# Patient Record
Sex: Female | Born: 1955 | Race: White | Hispanic: No | Marital: Married | Smoking: Former smoker
Health system: Southern US, Community
[De-identification: ages and names within clinical notes are randomized; demographics above are authoritative.]

## PROBLEM LIST (undated history)

## (undated) DIAGNOSIS — R112 Nausea with vomiting, unspecified: Secondary | ICD-10-CM

## (undated) DIAGNOSIS — M810 Age-related osteoporosis without current pathological fracture: Secondary | ICD-10-CM

## (undated) DIAGNOSIS — Z9221 Personal history of antineoplastic chemotherapy: Secondary | ICD-10-CM

## (undated) DIAGNOSIS — E7800 Pure hypercholesterolemia, unspecified: Secondary | ICD-10-CM

## (undated) DIAGNOSIS — Z9889 Other specified postprocedural states: Secondary | ICD-10-CM

## (undated) DIAGNOSIS — C801 Malignant (primary) neoplasm, unspecified: Secondary | ICD-10-CM

## (undated) DIAGNOSIS — K635 Polyp of colon: Secondary | ICD-10-CM

## (undated) DIAGNOSIS — D649 Anemia, unspecified: Secondary | ICD-10-CM

## (undated) DIAGNOSIS — T7840XA Allergy, unspecified, initial encounter: Secondary | ICD-10-CM

## (undated) DIAGNOSIS — Z803 Family history of malignant neoplasm of breast: Secondary | ICD-10-CM

## (undated) DIAGNOSIS — G620 Drug-induced polyneuropathy: Secondary | ICD-10-CM

## (undated) HISTORY — DX: Family history of malignant neoplasm of breast: Z80.3

## (undated) HISTORY — PX: COLONOSCOPY: SHX174

## (undated) HISTORY — DX: Polyp of colon: K63.5

## (undated) HISTORY — DX: Age-related osteoporosis without current pathological fracture: M81.0

## (undated) HISTORY — DX: Allergy, unspecified, initial encounter: T78.40XA

---

## 1990-06-28 HISTORY — PX: OVARIAN CYST REMOVAL: SHX89

## 1991-06-29 HISTORY — PX: TUBAL LIGATION: SHX77

## 2007-05-30 ENCOUNTER — Ambulatory Visit: Payer: Self-pay | Admitting: Gastroenterology

## 2015-07-30 HISTORY — PX: COLONOSCOPY: SHX174

## 2016-08-16 ENCOUNTER — Other Ambulatory Visit: Payer: Self-pay | Admitting: Certified Nurse Midwife

## 2016-08-16 DIAGNOSIS — Z1231 Encounter for screening mammogram for malignant neoplasm of breast: Secondary | ICD-10-CM

## 2016-09-14 ENCOUNTER — Ambulatory Visit
Admission: RE | Admit: 2016-09-14 | Discharge: 2016-09-14 | Disposition: A | Discharge status: Home / Self Care | Payer: BC Managed Care – PPO | Source: Ambulatory Visit | Attending: Certified Nurse Midwife | Admitting: Certified Nurse Midwife

## 2016-09-14 DIAGNOSIS — Z1231 Encounter for screening mammogram for malignant neoplasm of breast: Secondary | ICD-10-CM | POA: Diagnosis not present

## 2016-09-14 IMAGING — MG MM DIGITAL SCREENING BILAT W/ TOMO W/ CAD
9 of 13 series · 9 of 29 positions shown · non-contrast
Comparison: Previous exam(s).

CLINICAL DATA: Screening.

EXAM:
2D DIGITAL SCREENING BILATERAL MAMMOGRAM WITH CAD AND ADJUNCT TOMO

[L MLO (1 of 2)]
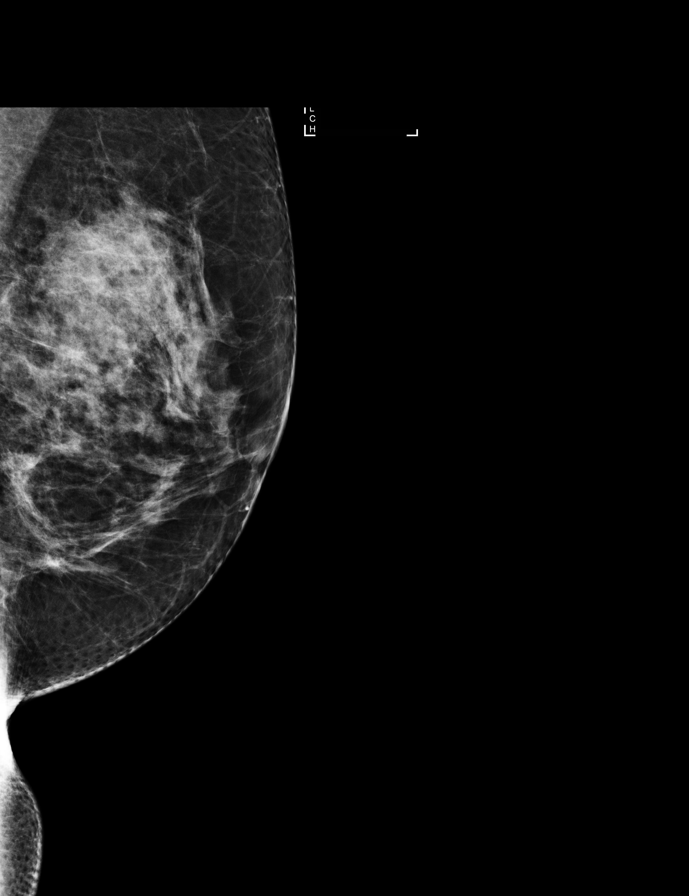

[R MLO]
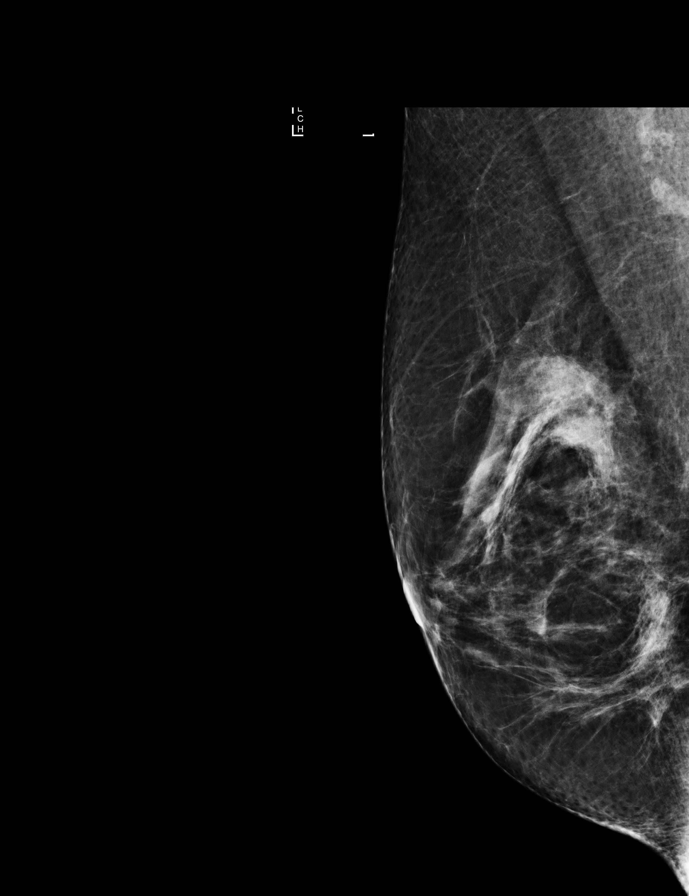

[R CC synth-2D]
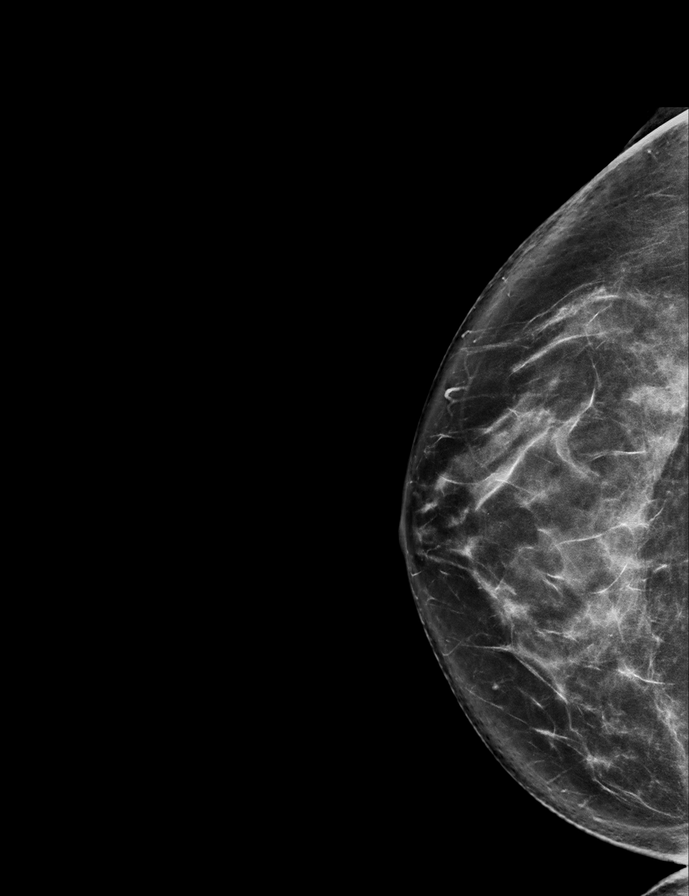

[L CC synth-2D]
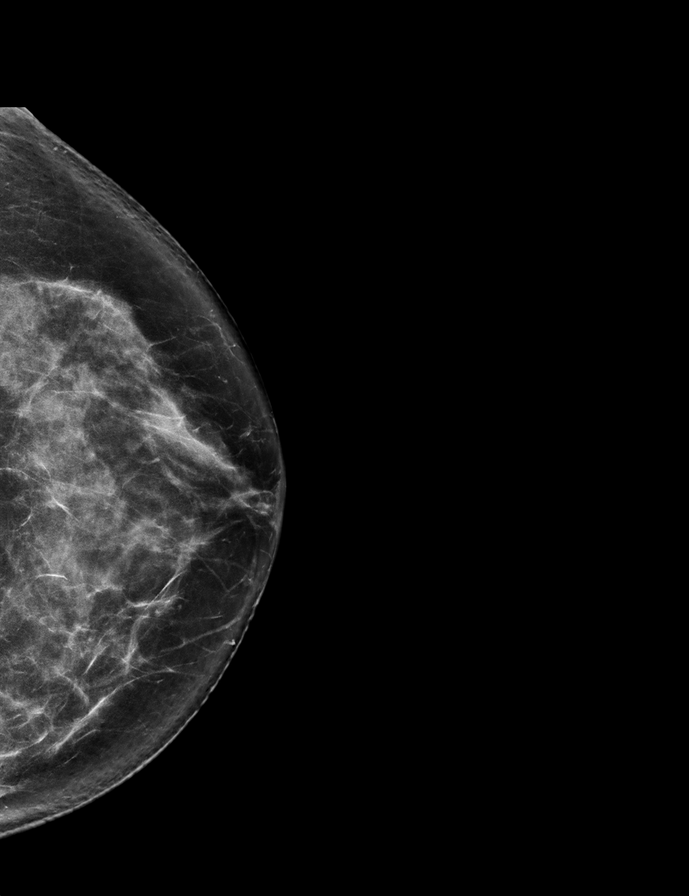

[R CC]
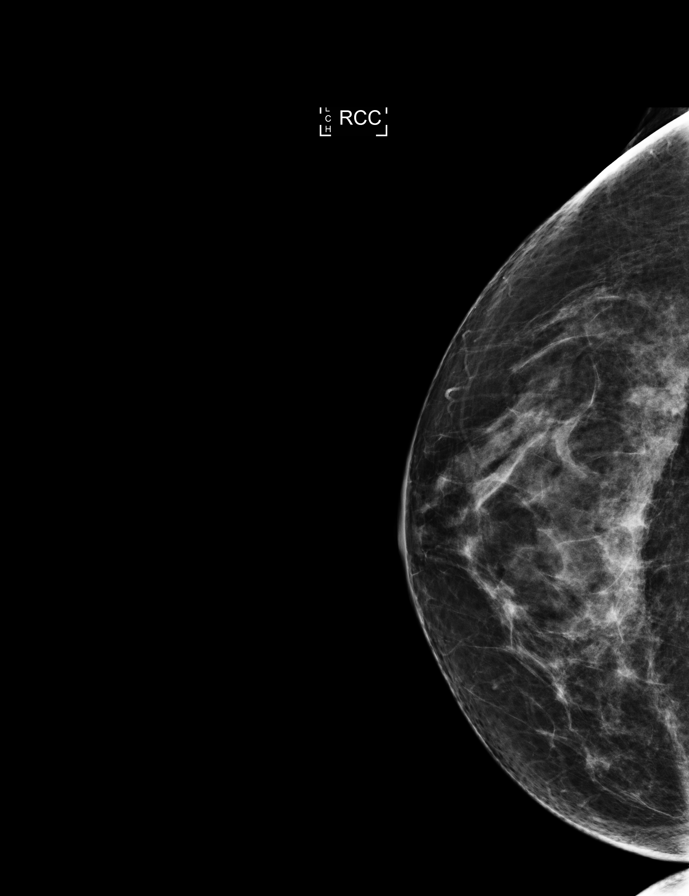

[L MLO (2 of 2)]
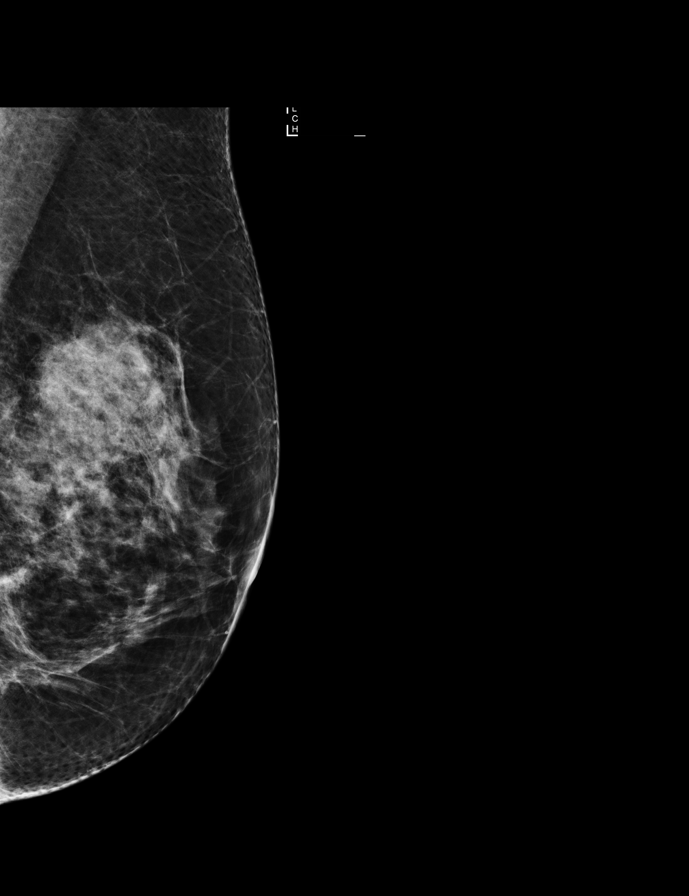

[L CC]
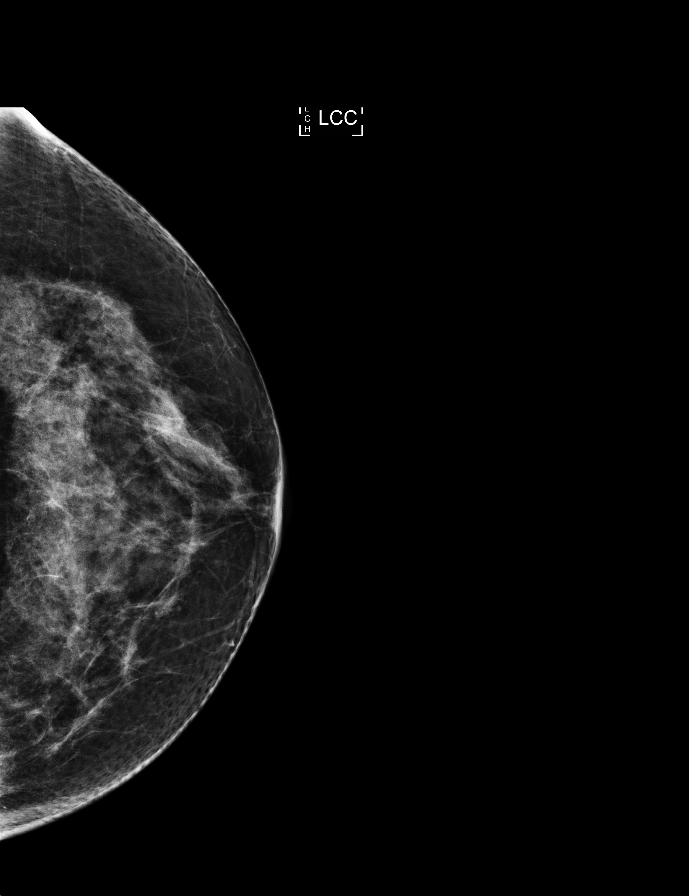

[R MLO synth-2D]
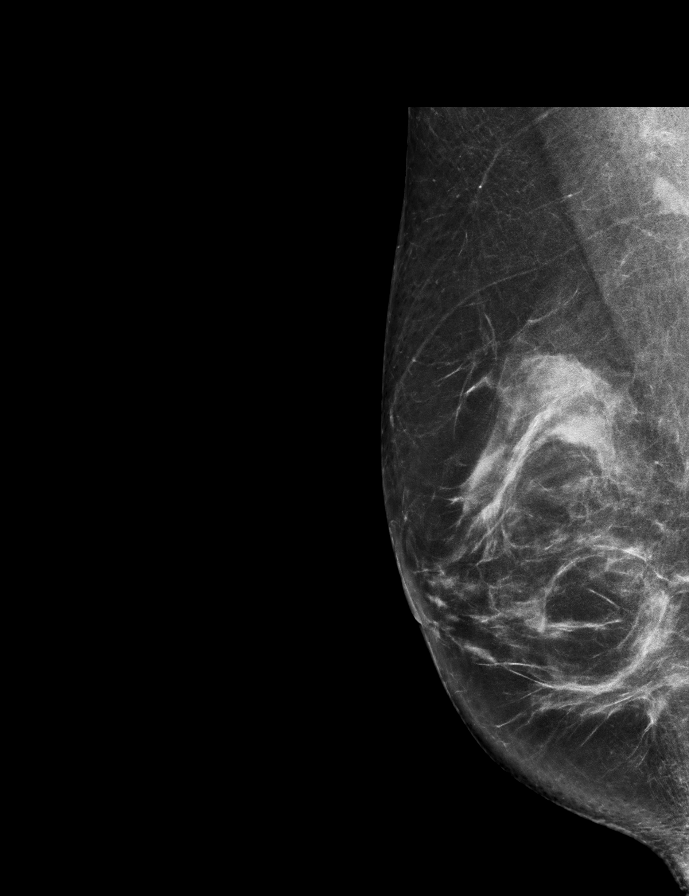

[L MLO synth-2D]
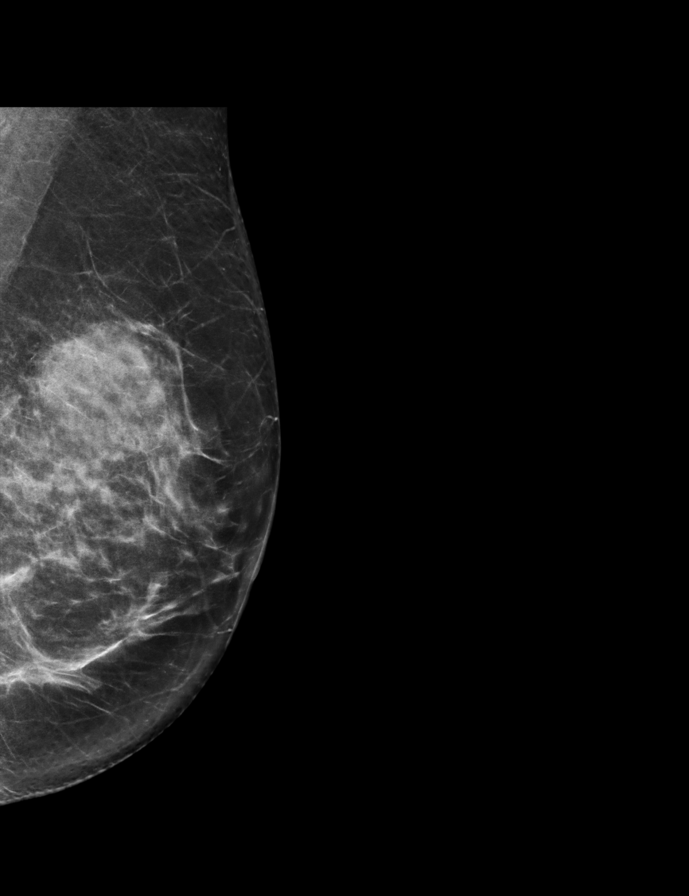

[9 of 29 positions shown; findings below may reference images not displayed]

ACR Breast Density Category c: The breast tissue is heterogeneously
dense, which may obscure small masses.
FINDINGS: There are no findings suspicious for malignancy. Images were
processed with CAD.
IMPRESSION: No mammographic evidence of malignancy. A result letter of this
screening mammogram will be mailed directly to the patient.

RECOMMENDATION:
Screening mammogram in one year. (Code:[TA])

BI-RADS CATEGORY  1: Negative.

## 2016-09-22 ENCOUNTER — Inpatient Hospital Stay
Admission: RE | Admit: 2016-09-22 | Discharge: 2016-09-22 | Disposition: A | Discharge status: Home / Self Care | Payer: Self-pay | Source: Ambulatory Visit | Attending: *Deleted | Admitting: *Deleted

## 2016-09-22 ENCOUNTER — Other Ambulatory Visit: Payer: Self-pay | Admitting: *Deleted

## 2016-09-22 DIAGNOSIS — Z9289 Personal history of other medical treatment: Secondary | ICD-10-CM

## 2017-05-12 DIAGNOSIS — Z780 Asymptomatic menopausal state: Secondary | ICD-10-CM | POA: Insufficient documentation

## 2017-05-28 DIAGNOSIS — Z780 Asymptomatic menopausal state: Secondary | ICD-10-CM

## 2017-05-28 DIAGNOSIS — M8580 Other specified disorders of bone density and structure, unspecified site: Secondary | ICD-10-CM

## 2017-05-28 HISTORY — DX: Other specified disorders of bone density and structure, unspecified site: M85.80

## 2017-05-28 HISTORY — DX: Asymptomatic menopausal state: Z78.0

## 2017-09-09 ENCOUNTER — Encounter: Payer: Self-pay | Admitting: Certified Nurse Midwife

## 2017-09-09 ENCOUNTER — Ambulatory Visit (INDEPENDENT_AMBULATORY_CARE_PROVIDER_SITE_OTHER): Payer: BC Managed Care – PPO | Admitting: Certified Nurse Midwife

## 2017-09-09 VITALS — BP 128/78 | HR 66 | Ht 67.5 in | Wt 162.0 lb

## 2017-09-09 DIAGNOSIS — Z124 Encounter for screening for malignant neoplasm of cervix: Secondary | ICD-10-CM | POA: Diagnosis not present

## 2017-09-09 DIAGNOSIS — Z1239 Encounter for other screening for malignant neoplasm of breast: Secondary | ICD-10-CM

## 2017-09-09 DIAGNOSIS — M810 Age-related osteoporosis without current pathological fracture: Secondary | ICD-10-CM

## 2017-09-09 DIAGNOSIS — Z780 Asymptomatic menopausal state: Secondary | ICD-10-CM

## 2017-09-09 DIAGNOSIS — Z1231 Encounter for screening mammogram for malignant neoplasm of breast: Secondary | ICD-10-CM

## 2017-09-09 DIAGNOSIS — M8580 Other specified disorders of bone density and structure, unspecified site: Secondary | ICD-10-CM

## 2017-09-09 DIAGNOSIS — Z01419 Encounter for gynecological examination (general) (routine) without abnormal findings: Secondary | ICD-10-CM | POA: Diagnosis not present

## 2017-09-09 NOTE — Progress Notes (Signed)
Gynecology Annual Exam  PCP: Glendon Axe, MD  Chief Complaint:  Chief Complaint  Patient presents with  . Gynecologic Exam    History of Present Illness:Cassandra [name] presents today for her annual exam. She is a 62 year old Caucasian/White female, G 3 P 2 0 1 2, whose LMP was in the distant past. Her menses are absent and she is postmenopausal. She has had no spotting.  She does not have central weight gain, difficulty sleeping, hot flashes, joint pain, mood changes, and night sweats.   The patient's past medical history is remarkable for osteopenia.  Since her last annual GYN exam dated 07/05/2016, she has had no significant changes in her health history.  She is sexually active. She does have vaginal dryness. She is using lubricants with modest symptomatic relief.  Her most recent pap smear was obtained 07/05/2016 and was NIL. Her most recent mammogram obtained on 09/14/2016 was normal. There is a positive history of breast cancer in her paternal aunt. Genetic testing was not done. There is no family history of ovarian cancer. The patient does do monthly self breast exams.  She had a colonoscopy in 07/2015 that was normal. Her next colonoscopy is due in 5 years.  She had a recent DEXA scan obtained 05/30/2017  that showed osteopenia in the femoral neck (T score -2.0). She is taking calcium and vitamin D3 supplements The patient does not smoke.  The patient does drink 7-10 cans of beer/week. The patient does not use illegal drugs.  The patient exercises by walking her dogs 30 min on most days. 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 Her BMI is 25 kg/m2 The patient does get adequate calcium in her diet and with her supplement She has had a recent cholesterol screen in 2018 that was borderline.    The patient denies current symptoms of depression.    Review of Systems: Review of Systems  Constitutional: Negative for chills, fever and weight loss.  HENT: Positive for congestion.  Negative for sinus pain and sore throat.   Eyes: Negative for blurred vision and pain.  Respiratory: Positive for cough. Negative for hemoptysis, shortness of breath and wheezing.   Cardiovascular: Negative for chest pain, palpitations and leg swelling.  Gastrointestinal: Negative for abdominal pain, blood in stool, diarrhea, heartburn, nausea and vomiting.  Genitourinary: Negative for dysuria, frequency, hematuria and urgency.       Positive for vaginal dryness  Musculoskeletal: Negative for back pain, joint pain and myalgias.  Skin: Negative for itching and rash.  Neurological: Negative for dizziness, tingling and headaches.  Endo/Heme/Allergies: Negative for environmental allergies and polydipsia. Does not bruise/bleed easily.       Negative for hirsutism   Psychiatric/Behavioral: Negative for depression. The patient is not nervous/anxious and does not have insomnia.     Past Medical History:  Past Medical History:  Diagnosis Date  . Colon polyps   . Osteopenia after menopause 05/2017   femoral neck T score -2.0    Past Surgical History:  Past Surgical History:  Procedure Laterality Date  . COLONOSCOPY  07/2015   WNL  . COLONOSCOPY  2008/2011  . OVARIAN CYST REMOVAL  1992   dermoid-Dr [place]  . TUBAL LIGATION  1993    Family History:  Family History  Problem Relation Age of Onset  . Breast cancer Paternal Aunt 16  . Diabetes Mother   . Osteoporosis Mother   . Hyperlipidemia Father   . Rheumatic  fever Father   . Valvular heart disease Father   . Colon cancer Paternal Grandmother     Social History:  Social History   Socioeconomic History  . Marital status: Married    Spouse name: Not on file  . Number of children: 2  . Years of education: Not on file  . Highest education level: Not on file  Social Needs  . Financial resource strain: Not on file  . Food insecurity - worry: Not on file  . Food insecurity - inability: Not on file  . Transportation needs -  medical: Not on file  . Transportation needs - non-medical: Not on file  Occupational History  . Occupation: Pharmacist, hospital  . Occupation: Farmer  Tobacco Use  . Smoking status: Former Research scientist (life sciences)  . Smokeless tobacco: Never Used  . Tobacco comment: Quit smoking 1987  Substance and Sexual Activity  . Alcohol use: Yes    Alcohol/week: 4.2 - 6.0 oz    Types: 7 - 10 Cans of beer per week  . Drug use: No  . Sexual activity: Yes    Birth control/protection: Post-menopausal  Other Topics Concern  . Not on file  Social History Narrative  . Not on file    Allergies:  Allergies  Allergen Reactions  . Penicillin G Hives and Swelling    Rxn as a child    Medications:  Current Outpatient Medications on File Prior to Visit  Medication Sig Dispense Refill  . calcium-vitamin D (OSCAL WITH D) 500-200 MG-UNIT TABS tablet Take by mouth.    . Cholecalciferol (VITAMIN D-1000 MAX ST) 1000 units tablet Take by mouth.    . Melatonin 3 MG TABS Take by mouth at bedtime as needed.    . Multiple Vitamins-Minerals (MULTIVITAMIN ADULT PO) Take by mouth.     No current facility-administered medications on file prior to visit.     Also Flonase prn Physical Exam Vitals: BP 128/78   Pulse 66   Ht 5' 7.5" (1.715 m)   Wt 162 lb (73.5 kg)   BMI 25.00 kg/m  General: WF in NAD HEENT: normocephalic, anicteric Neck: no thyroid enlargement, no palpable nodules, no cervical lymphadenopathy  Pulmonary: No increased work of breathing, CTAB Cardiovascular: RRR, without murmur  Breast: Breast symmetrical, no tenderness, no palpable nodules or masses, no skin or nipple retraction present, no nipple discharge.  No axillary, infraclavicular or supraclavicular lymphadenopathy. Abdomen: Soft, non-tender, non-distended.  Umbilicus without lesions.  No hepatomegaly or masses palpable. No evidence of hernia. Genitourinary:  External: Atrophic changes  Normal urethral meatus, normal Bartholin's and Skene's glands.    Vagina:  flattened rugae, no evidence of prolapse.    Cervix: Grossly normal in appearance, no bleeding, non-tender  Uterus: Anteverted to midplane, normal size, shape, and consistency, mobile, and non-tender  Adnexa: No adnexal masses, non-tender  Rectal: deferred  Lymphatic: no evidence of inguinal lymphadenopathy Extremities: no edema, erythema, or tenderness Neurologic: Grossly intact Psychiatric: mood appropriate, affect full     Assessment: 62 y.o. annual gyn exam  Plan:  1) Breast cancer screening - recommend monthly self breast exam and annual mamogram. Mammogram was ordered today.  Colon cancer screening-colonoscopy UTD.  3) Cervical cancer screening - Pap was done. ASCCP guidelines and rational discussed.  Patient opts for yearly screening interval  4) Osteoporosis prevention-discussed calcium and vitamin D3 requirements and the role of exercise in preventing osteoporosis  5) Routine healthcare maintenance including cholesterol and diabetes screening managed by PCP   6) RTO 1 year  and prn   Dalia Heading, CNM

## 2017-09-11 ENCOUNTER — Encounter: Payer: Self-pay | Admitting: Certified Nurse Midwife

## 2017-09-11 DIAGNOSIS — M810 Age-related osteoporosis without current pathological fracture: Secondary | ICD-10-CM

## 2017-09-11 DIAGNOSIS — M8580 Other specified disorders of bone density and structure, unspecified site: Secondary | ICD-10-CM | POA: Insufficient documentation

## 2017-09-11 DIAGNOSIS — Z780 Asymptomatic menopausal state: Secondary | ICD-10-CM | POA: Insufficient documentation

## 2017-09-16 LAB — IGP, APTIMA HPV
HPV Aptima: NEGATIVE
PAP Smear Comment: 0

## 2017-09-30 ENCOUNTER — Ambulatory Visit
Admission: RE | Admit: 2017-09-30 | Discharge: 2017-09-30 | Disposition: A | Discharge status: Home / Self Care | Payer: BC Managed Care – PPO | Source: Ambulatory Visit | Attending: Certified Nurse Midwife | Admitting: Certified Nurse Midwife

## 2017-09-30 DIAGNOSIS — Z1231 Encounter for screening mammogram for malignant neoplasm of breast: Secondary | ICD-10-CM | POA: Diagnosis not present

## 2017-09-30 DIAGNOSIS — Z1239 Encounter for other screening for malignant neoplasm of breast: Secondary | ICD-10-CM

## 2017-09-30 IMAGING — MG MM DIGITAL SCREENING BILAT W/ TOMO W/ CAD
9 of 13 series · 9 of 29 positions shown · non-contrast
Comparison: Previous exam(s).

CLINICAL DATA: Screening.

EXAM:
DIGITAL SCREENING BILATERAL MAMMOGRAM WITH TOMO AND CAD

[R MLO (1 of 2)]
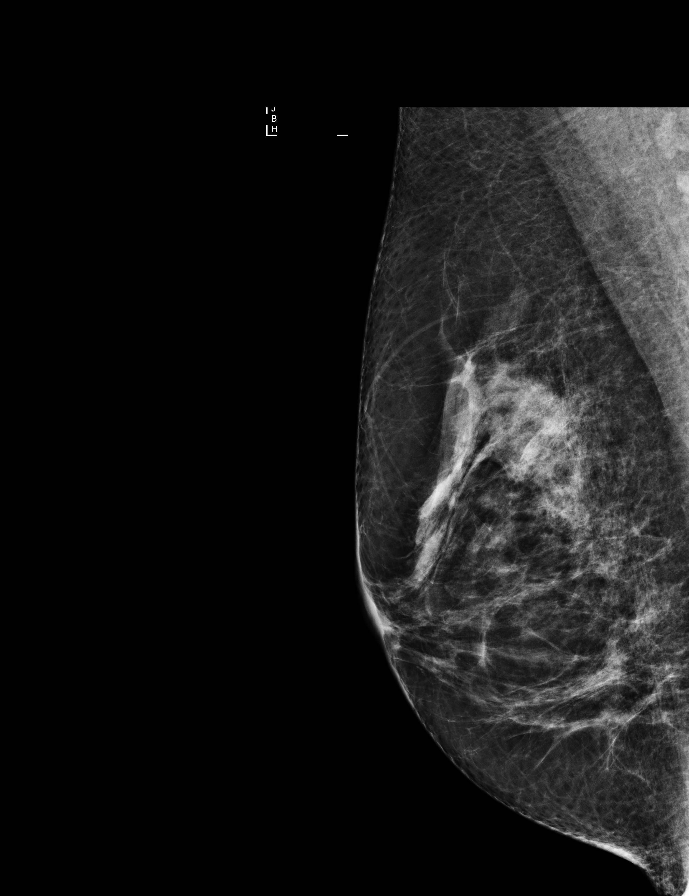

[L MLO synth-2D]
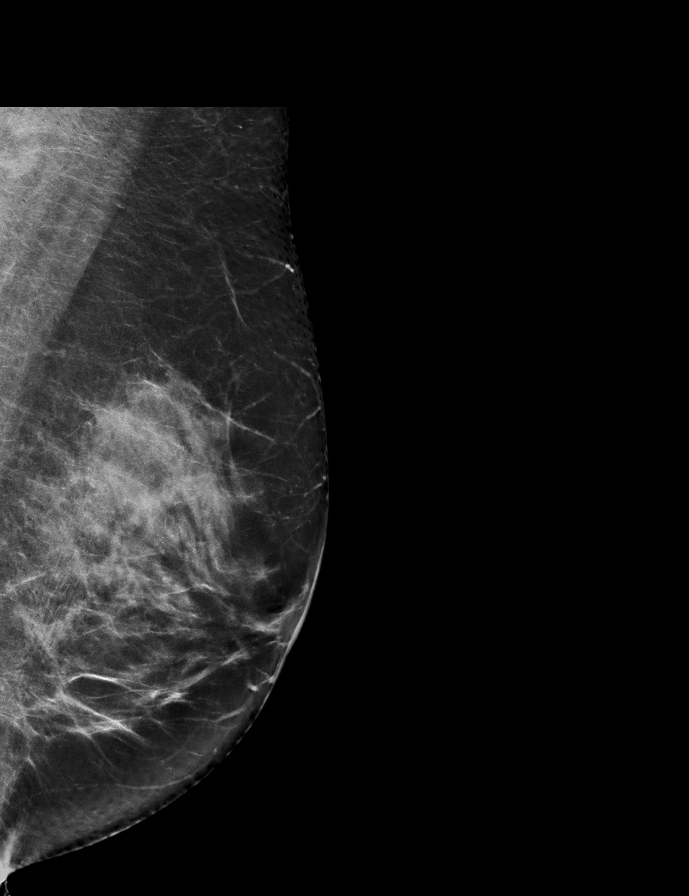

[R MLO synth-2D]
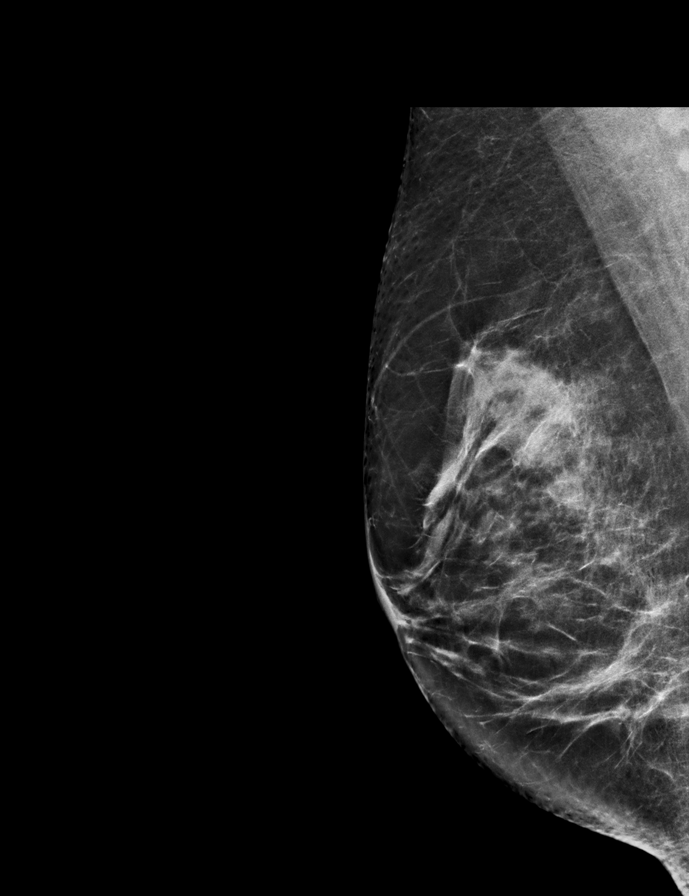

[R CC]
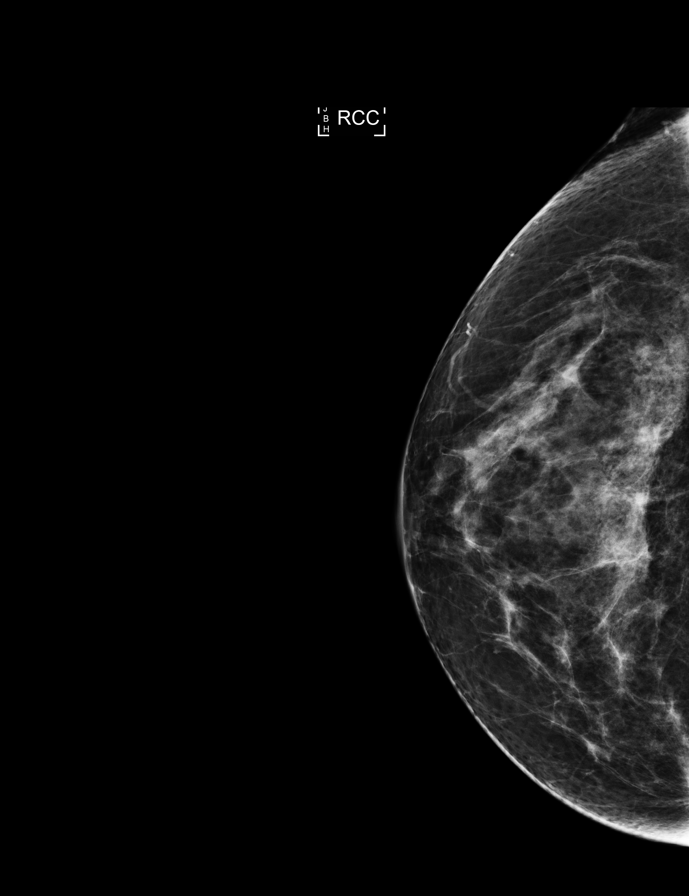

[R MLO (2 of 2)]
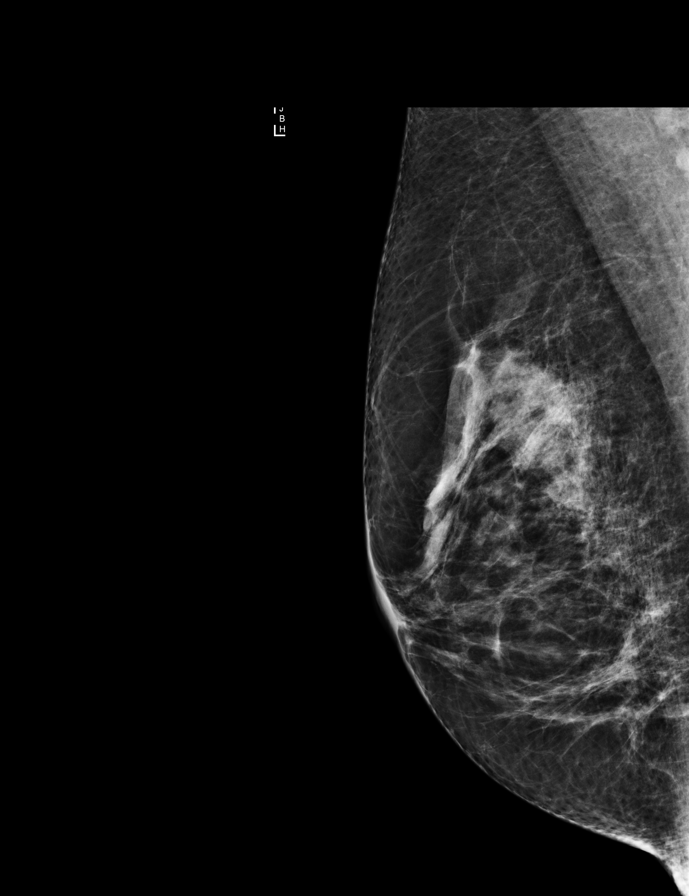

[L CC synth-2D]
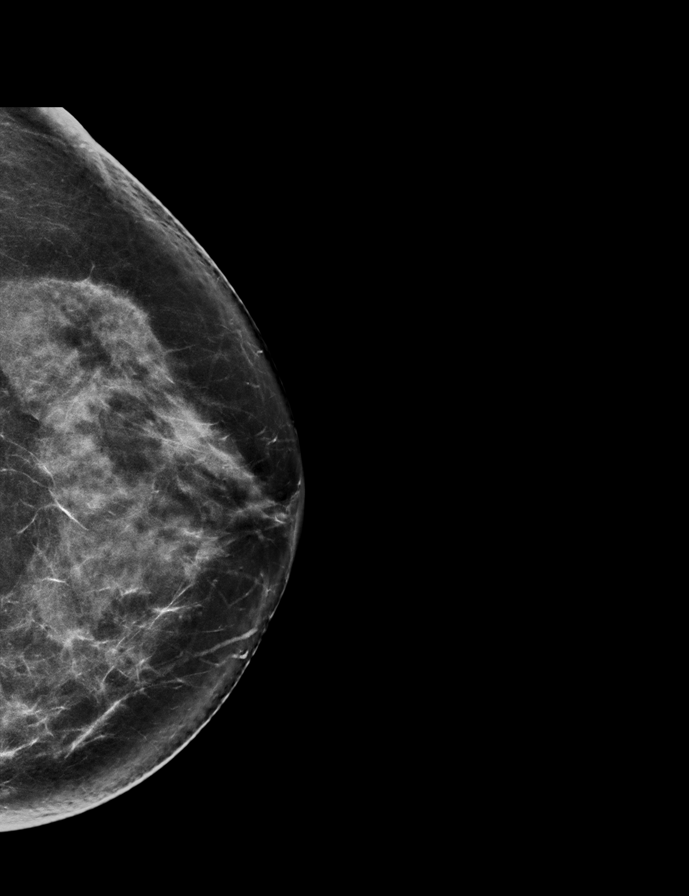

[L CC]
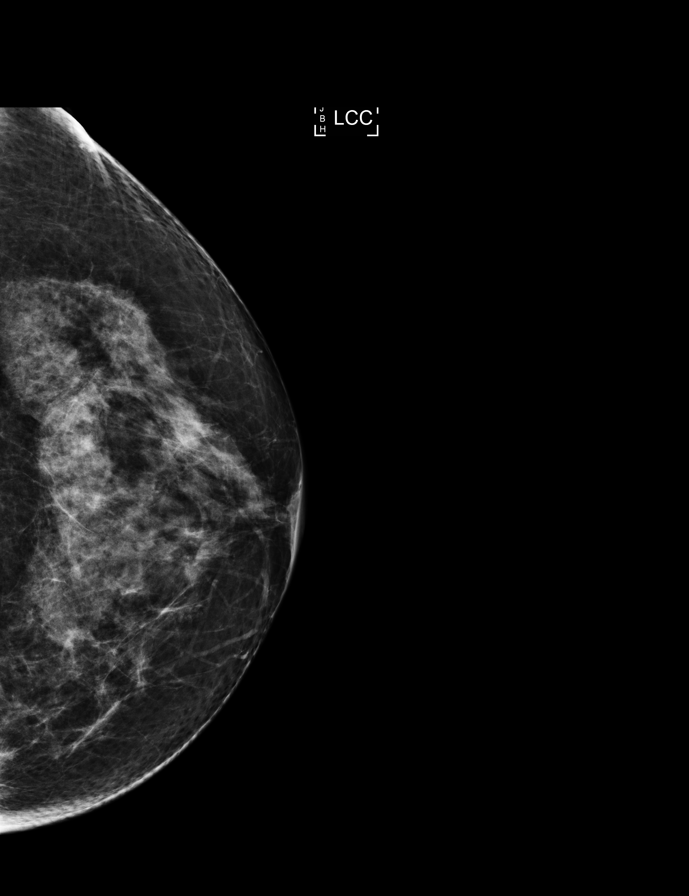

[L MLO]
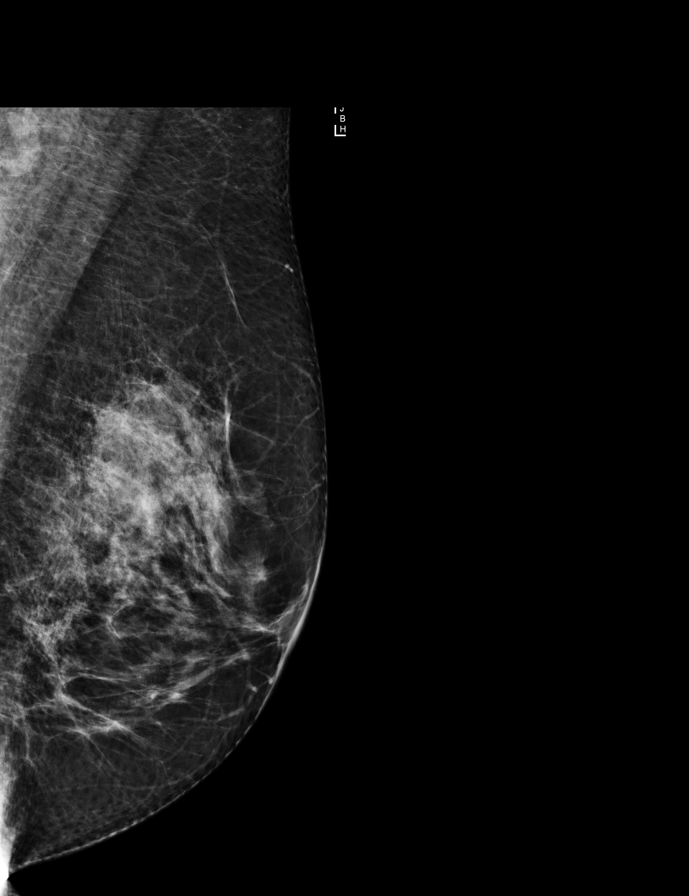

[R CC synth-2D]
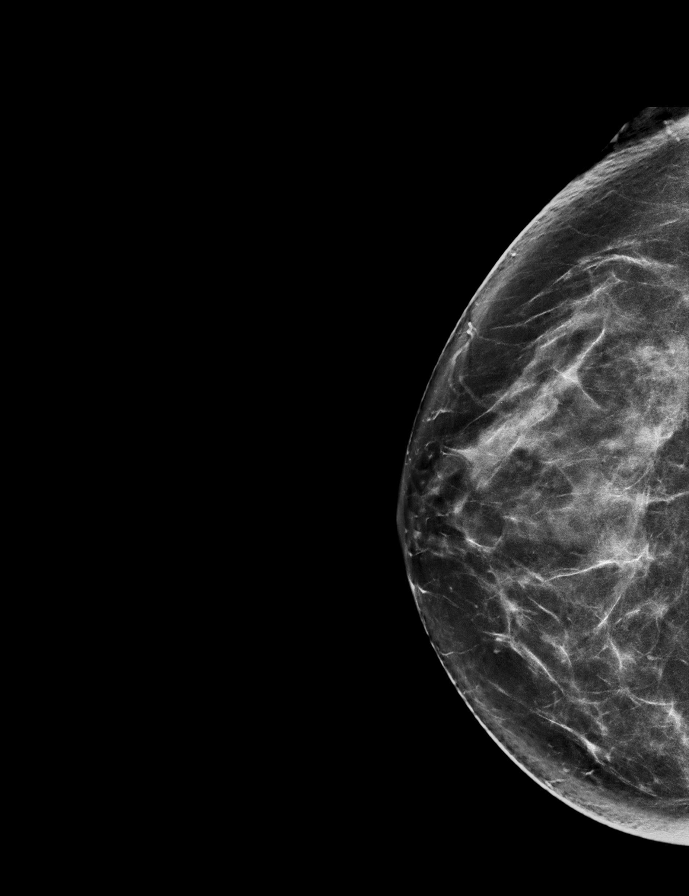

[9 of 29 positions shown; findings below may reference images not displayed]

ACR Breast Density Category c: The breast tissue is heterogeneously
dense, which may obscure small masses.
FINDINGS: There are no findings suspicious for malignancy. Images were
processed with CAD.
IMPRESSION: No mammographic evidence of malignancy. A result letter of this
screening mammogram will be mailed directly to the patient.

RECOMMENDATION:
Screening mammogram in one year. (Code:[5V])

BI-RADS CATEGORY  1: Negative.

## 2018-08-21 ENCOUNTER — Other Ambulatory Visit: Payer: Self-pay | Admitting: Certified Nurse Midwife

## 2018-08-21 DIAGNOSIS — Z1231 Encounter for screening mammogram for malignant neoplasm of breast: Secondary | ICD-10-CM

## 2018-09-13 NOTE — Progress Notes (Signed)
Gynecology Annual Exam  PCP: Glendon Axe, MD  Chief Complaint:  Chief Complaint  Patient presents with  . Gynecologic Exam    History of Present Illness:Cassandra [name] presents today for her annual exam. She is a 63 year old Caucasian/White female, G 3 P 2 0 1 2, whose LMP was in the distant past. Her menses are absent and she is postmenopausal. She has had no spotting.  She does not have central weight gain, difficulty sleeping, hot flashes, joint pain, mood changes, and night sweats.   The patient's past medical history is remarkable for osteopenia.  Since her last annual GYN exam dated 07/05/2016, she has had no significant changes in her health history.  She is not currently sexually active. She does have vaginal dryness. She was using lubricants during IC. Her most recent pap smear was obtained 09/09/2017 and was NIL/ negaative HRHPV. Her most recent mammogram obtained on 09/30/2017 was normal. There is a positive history of breast cancer in her paternal aunt. Genetic testing was not done. There is no family history of ovarian cancer. The patient does do monthly self breast exams.  She had a colonoscopy in 07/2015 that was normal. Her next colonoscopy is due in 5 years.  She had a recent DEXA scan obtained 05/30/2017  that showed osteopenia in the femoral neck (T score -2.0). She is taking calcium and vitamin D3 supplements The patient does not smoke.  The patient does drink 7-10 cans of beer/week. The patient does not use illegal drugs.  The patient exercises by walking. 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    The patient denies current symptoms of depression.    Review of Systems: Review of Systems  Constitutional: Negative for chills, fever and weight loss.  HENT: Positive for congestion. Negative for  sinus pain and sore throat.   Eyes: Negative for blurred vision and pain.  Respiratory: Positive for cough. Negative for hemoptysis, shortness of breath and wheezing.   Cardiovascular: Negative for chest pain, palpitations and leg swelling.  Gastrointestinal: Negative for abdominal pain, blood in stool, diarrhea, heartburn, nausea and vomiting.  Genitourinary: Negative for dysuria, frequency, hematuria and urgency.       Positive for vaginal dryness  Musculoskeletal: Negative for back pain, joint pain and myalgias.  Skin: Negative for itching and rash.  Neurological: Negative for dizziness, tingling and headaches.  Endo/Heme/Allergies: Negative for environmental allergies and polydipsia. Does not bruise/bleed easily.       Negative for hirsutism   Psychiatric/Behavioral: Negative for depression. The patient is not nervous/anxious and does not have insomnia.     Past Medical History:  Past Medical History:  Diagnosis Date  . Colon polyps   . Osteopenia after menopause 05/2017   femoral neck T score -2.0    Past Surgical History:  Past Surgical History:  Procedure Laterality Date  . COLONOSCOPY  07/2015   WNL  . COLONOSCOPY  2008/2011  . OVARIAN CYST REMOVAL  1992   dermoid-Dr [place]  . TUBAL LIGATION  1993    Family History:  Family History  Problem Relation Age of Onset  . Breast cancer Paternal Aunt 26  . Diabetes Mother   . Osteoporosis Mother   . Hyperlipidemia Father   . Rheumatic fever Father   .  Valvular heart disease Father   . Colon cancer Paternal Grandmother     Social History:  Social History   Socioeconomic History  . Marital status: Married    Spouse name: Not on file  . Number of children: 2  . Years of education: Not on file  . Highest education level: Not on file  Occupational History  . Occupation: Pharmacist, hospital  . Occupation: [employer]  . Financial resource strain: Not on file  . Food insecurity:    Worry: Not on file    Inability:  Not on file  . Transportation needs:    Medical: Not on file    Non-medical: Not on file  Tobacco Use  . Smoking status: Former Research scientist (life sciences)  . Smokeless tobacco: Never Used  . Tobacco comment: Quit smoking 1987  Substance and Sexual Activity  . Alcohol use: Yes    Alcohol/week: 7.0 - 10.0 standard drinks    Types: 7 - 10 Cans of beer per week  . Drug use: No  . Sexual activity: Yes    Birth control/protection: Post-menopausal  Lifestyle  . Physical activity:    Days per week: 5 days    Minutes per session: 40 min  . Stress: Only a little  Relationships  . Social connections:    Talks on phone: Not on file    Gets together: Not on file    Attends religious service: Not on file    Active member of club or organization: Not on file    Attends meetings of clubs or organizations: Not on file    Relationship status: Not on file  . Intimate partner violence:    Fear of current or ex partner: Not on file    Emotionally abused: Not on file    Physically abused: Not on file    Forced sexual activity: Not on file  Other Topics Concern  . Not on file  Social History Narrative  . Not on file    Allergies:  Allergies  Allergen Reactions  . Penicillin G Hives and Swelling    Rxn as a child    Medications:  Current Outpatient Medications on File Prior to Visit  Medication Sig Dispense Refill  . calcium-vitamin D (OSCAL WITH D) 500-200 MG-UNIT TABS tablet Take by mouth.    . Cholecalciferol (VITAMIN D-1000 MAX ST) 1000 units tablet Take by mouth.    . Melatonin 3 MG TABS Take by mouth at bedtime as needed.    . Multiple Vitamins-Minerals (MULTIVITAMIN ADULT PO) Take by mouth.     No current facility-administered medications on file prior to visit.     Also Flonase prn Physical Exam Vitals: BP 120/80   Ht 5\' 7"  (1.702 m)   Wt 170 lb (77.1 kg)   BMI 26.63 kg/m  General: WF in NAD HEENT: normocephalic, anicteric Neck: no thyroid enlargement, no palpable nodules, no cervical  lymphadenopathy  Pulmonary: No increased work of breathing, CTAB Cardiovascular: RRR, without murmur  Breast: Breast symmetrical, no tenderness, no palpable nodules or masses, no skin or nipple retraction present, no nipple discharge.  No axillary, infraclavicular or supraclavicular lymphadenopathy. Abdomen: Soft, non-tender, non-distended.  Umbilicus without lesions.  No hepatomegaly or masses palpable. No evidence of hernia. Genitourinary:  External: Atrophic changes  Normal urethral meatus, normal Bartholin's and Skene's glands.    Vagina: flattened rugae, no evidence of prolapse.    Cervix: Grossly normal in appearance, no bleeding, non-tender  Uterus: Anteverted to midplane, normal size, shape, and  consistency, mobile, and non-tender  Adnexa: No adnexal masses, non-tender  Rectal: deferred  Lymphatic: no evidence of inguinal lymphadenopathy Extremities: no edema, erythema, or tenderness Neurologic: Grossly intact Psychiatric: mood appropriate, affect full     Assessment: 63 y.o. annual gyn exam  Plan:  1) Breast cancer screening - recommend monthly self breast exam and annual mamogram. Mammogram is scheduled for 4/9 but may need to be rescheduled due to COVID 19  2) Colon cancer screening-colonoscopy UTD. Due 2022  3) Cervical cancer screening - Pap was done. ASCCP guidelines and rational discussed.  Patient opts for yearly screening interval  4) Osteoporosis prevention-discussed calcium and vitamin D3 requirements and the role of exercise in preventing osteoporosis. Recommend repeating DEXa in the next year.  5) Routine healthcare maintenance including cholesterol and diabetes screening managed by PCP   6) RTO 1 year and prn   Dalia Heading, CNM

## 2018-09-14 ENCOUNTER — Other Ambulatory Visit: Payer: Self-pay

## 2018-09-14 ENCOUNTER — Other Ambulatory Visit (HOSPITAL_COMMUNITY)
Admission: RE | Admit: 2018-09-14 | Discharge: 2018-09-14 | Disposition: A | Discharge status: Home / Self Care | Payer: BC Managed Care – PPO | Source: Ambulatory Visit | Attending: Certified Nurse Midwife | Admitting: Certified Nurse Midwife

## 2018-09-14 ENCOUNTER — Encounter: Payer: Self-pay | Admitting: Certified Nurse Midwife

## 2018-09-14 ENCOUNTER — Ambulatory Visit (INDEPENDENT_AMBULATORY_CARE_PROVIDER_SITE_OTHER): Payer: BC Managed Care – PPO | Admitting: Certified Nurse Midwife

## 2018-09-14 VITALS — BP 120/80 | Ht 67.0 in | Wt 170.0 lb

## 2018-09-14 DIAGNOSIS — Z780 Asymptomatic menopausal state: Secondary | ICD-10-CM

## 2018-09-14 DIAGNOSIS — M810 Age-related osteoporosis without current pathological fracture: Secondary | ICD-10-CM

## 2018-09-14 DIAGNOSIS — M8580 Other specified disorders of bone density and structure, unspecified site: Secondary | ICD-10-CM

## 2018-09-14 DIAGNOSIS — Z01419 Encounter for gynecological examination (general) (routine) without abnormal findings: Secondary | ICD-10-CM | POA: Diagnosis not present

## 2018-09-14 DIAGNOSIS — Z124 Encounter for screening for malignant neoplasm of cervix: Secondary | ICD-10-CM | POA: Insufficient documentation

## 2018-09-14 NOTE — Patient Instructions (Signed)
Please call the beginning of next year to have a bone density study scheduled. You can also call to schedule a cholesterol panel that should be done when you are fasting.     Preventing Osteoporosis, Adult Osteoporosis is a condition that causes the bones to get weaker. With osteoporosis, the bones become thinner, and the normal spaces in bone tissue become larger. This can make the bones weak and cause them to break more easily. People who have osteoporosis are more likely to break their wrist, spine, or hip. Even a minor accident or injury can be enough to break weak bones. Osteoporosis can occur with aging. Your body constantly replaces old bone tissue with new tissue. As you get older, you may lose bone tissue more quickly, or it may be replaced more slowly. Osteoporosis is more likely to develop if you have poor nutrition or do not get enough calcium or vitamin D. Other lifestyle factors can also play a role. By making some diet and lifestyle changes, you can help to keep your bones healthy and help to prevent osteoporosis. What nutrition changes can be made? Nutrition plays an important role in maintaining healthy, strong bones.  Make sure you get enough calcium every day from food or from calcium supplements. ? If you are age 16 or younger, aim to get 1,000 mg of calcium every day. ? If you are older than age 26, aim to get 1,200 mg of calcium every day.  Try to get enough vitamin D every day. ? If you are age 79 or younger, aim to get 600 international units (IU) every day. ? If you are older than age 66, aim to get 800 international units every day.  Follow a healthy diet. Eat plenty of foods that contain calcium and vitamin D. ? Calcium is in milk, cheese, yogurt, and other dairy products. Some fish and vegetables are also good sources of calcium. Many foods such as cereals and breads have had calcium added to them (are fortified). Check nutrition labels to see how much calcium is in a  food or drink. ? Foods that contain vitamin D include milk, cereals, salmon, and tuna. Your body also makes vitamin D when you are out in the sun. Bare skin exposure to the sun on your face, arms, legs, or back for no more than 30 minutes a day, 2 times per week is more than enough. Beyond that, it is important to use sunblock to protect your skin from sunburn, which increases your risk for skin cancer. What lifestyle changes can be made? Making changes in your everyday life can also play an important role in preventing osteoporosis.  Stay active and get exercise every day. Ask your health care provider what types of exercise are best for you.  Do not use any products that contain nicotine or tobacco, such as cigarettes and e-cigarettes. If you need help quitting, ask your health care provider.  Limit alcohol intake to no more than 1 drink a day for nonpregnant women and 2 drinks a day for men. One drink equals 12 oz of beer, 5 oz of wine, or 1 oz of hard liquor. Why are these changes important? Making these nutrition and lifestyle changes can:  Help you develop and maintain healthy, strong bones.  Prevent loss of bone mass and the problems that are caused by that loss, such as broken bones and delayed healing.  Make you feel better mentally and physically. What can happen if changes are not made? Problems  that can result from osteoporosis can be very serious. These may include:  A higher risk of broken bones that are painful and do not heal well.  Physical malformations, such as a collapsed spine or a hunched back.  Problems with movement. Where to find support If you need help making changes to prevent osteoporosis, talk with your health care provider. You can ask for a referral to a diet and nutrition specialist (dietitian) and a physical therapist. Where to find more information Learn more about osteoporosis from:  NIH Osteoporosis and Related [place]: www.niams.GolfingGoddess.com.br  U.S. Office on Women's Health: SouvenirBaseball.es.html  National Osteoporosis Foundation: ProfilePeek.ch Summary  Osteoporosis is a condition that causes weak bones that are more likely to break.  Eating a healthy diet and making sure you get enough calcium and vitamin D can help prevent osteoporosis.  Other ways to reduce your risk of osteoporosis include getting regular exercise and avoiding alcohol and products that contain nicotine or tobacco. This information is not intended to replace advice given to you by your health care provider. Make sure you discuss any questions you have with your health care provider. Document Released: 06/29/2015 Document Revised: 03/22/2017 Document Reviewed: 02/23/2016 Elsevier Interactive Patient Education  2019 [employer].

## 2018-09-17 DIAGNOSIS — E7800 Pure hypercholesterolemia, unspecified: Secondary | ICD-10-CM | POA: Insufficient documentation

## 2018-09-18 LAB — CYTOLOGY - PAP: Diagnosis: NEGATIVE

## 2018-12-14 ENCOUNTER — Ambulatory Visit
Admission: RE | Admit: 2018-12-14 | Discharge: 2018-12-14 | Disposition: A | Discharge status: Home / Self Care | Payer: BC Managed Care – PPO | Source: Ambulatory Visit | Attending: Certified Nurse Midwife | Admitting: Certified Nurse Midwife

## 2018-12-14 ENCOUNTER — Other Ambulatory Visit: Payer: Self-pay

## 2018-12-14 DIAGNOSIS — Z1231 Encounter for screening mammogram for malignant neoplasm of breast: Secondary | ICD-10-CM | POA: Diagnosis not present

## 2018-12-14 IMAGING — MG DIGITAL SCREENING BILATERAL MAMMOGRAM WITH TOMO AND CAD
8 series · 9 of 24 positions shown · non-contrast
Comparison: Previous exam(s).

CLINICAL DATA: Screening.

EXAM:
DIGITAL SCREENING BILATERAL MAMMOGRAM WITH TOMO AND CAD

[L MLO synth-2D]
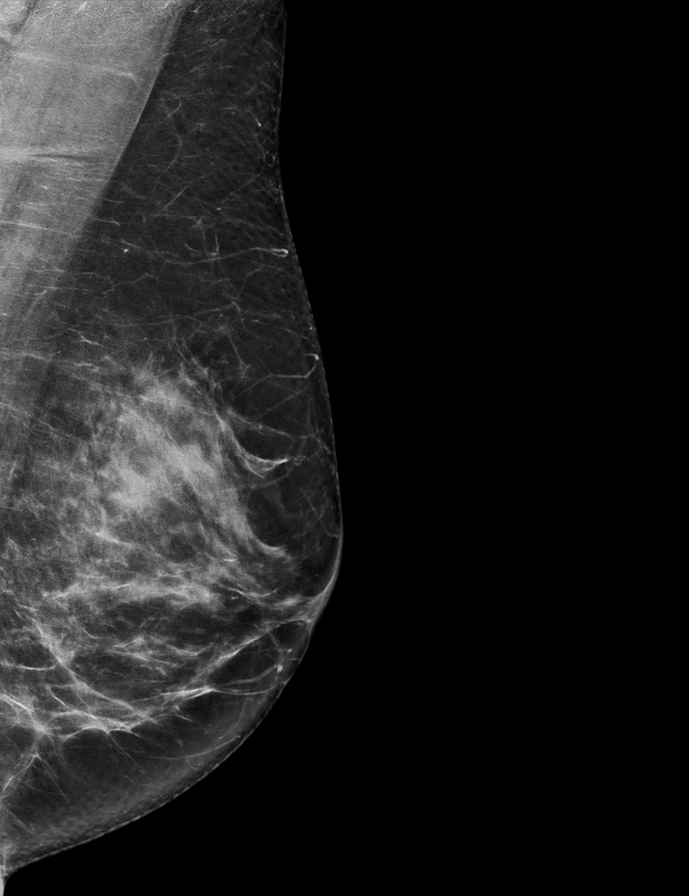

[R CC synth-2D]
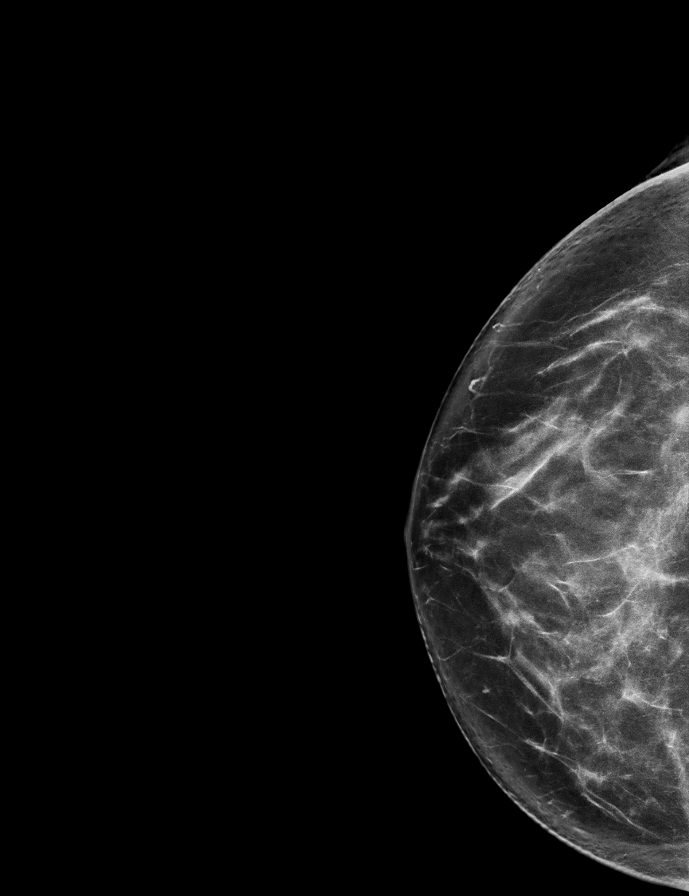

[R MLO synth-2D]
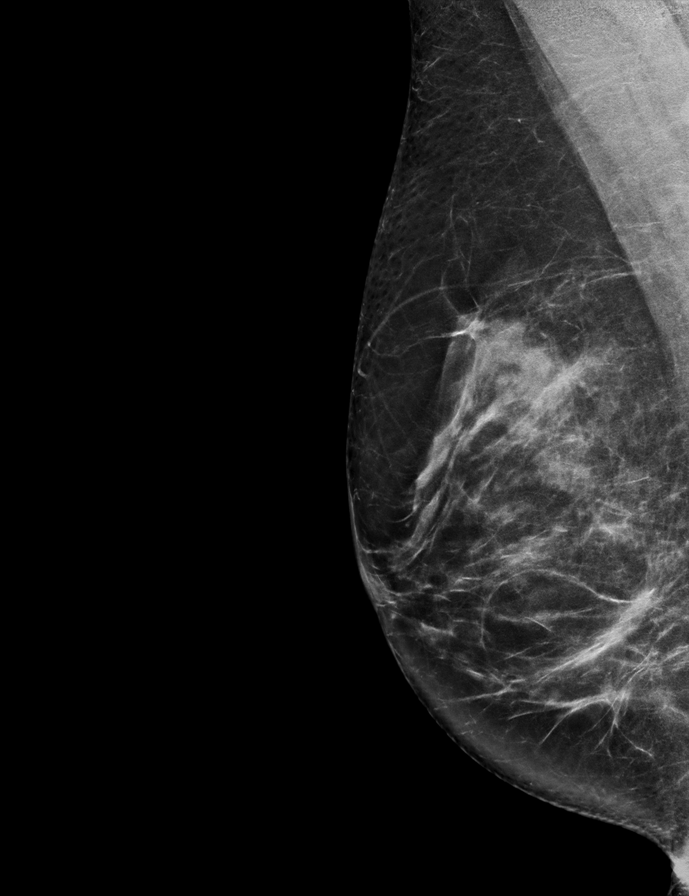

[L CC synth-2D]
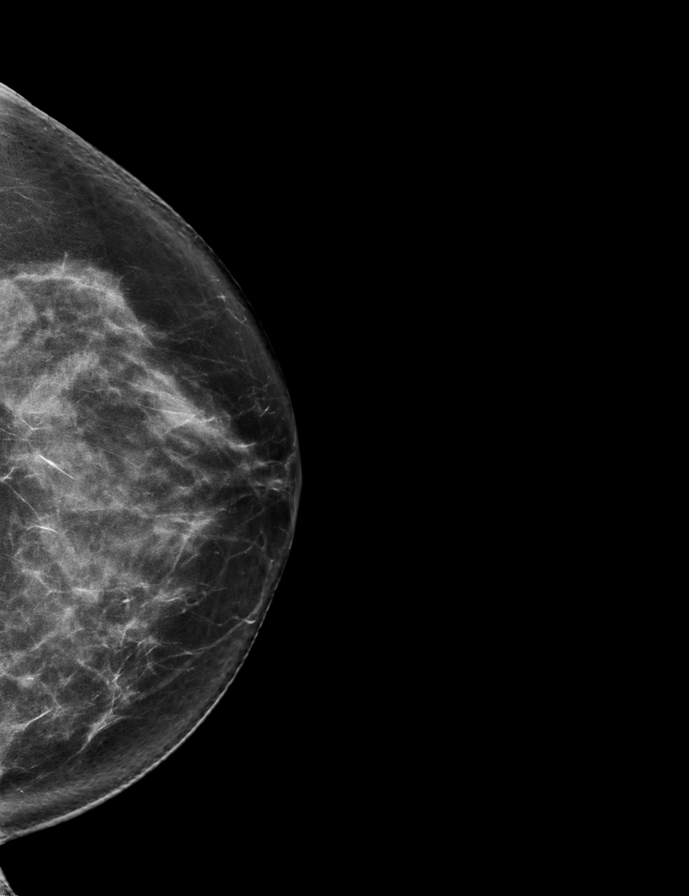

[L CC tomo · 2 of 76 frames shown]
[frame 25/76]
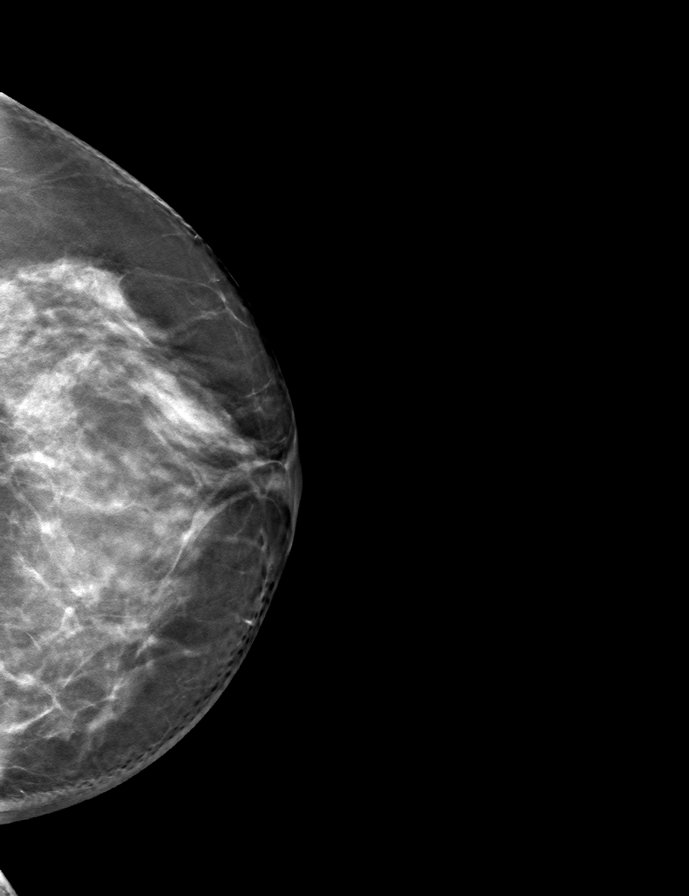
[frame 39/76]
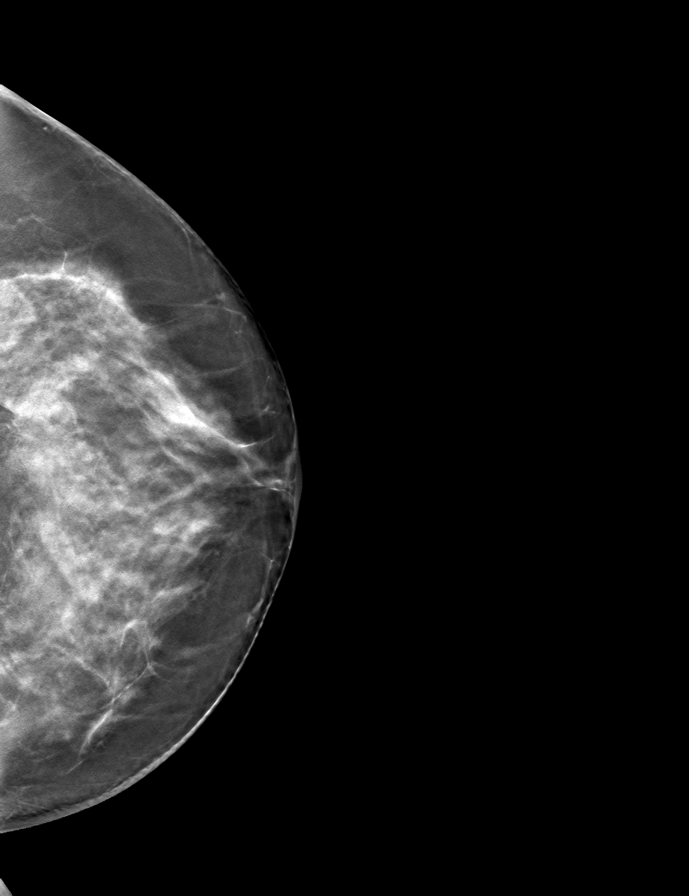

[L MLO tomo · tomo slice 37/73.0]
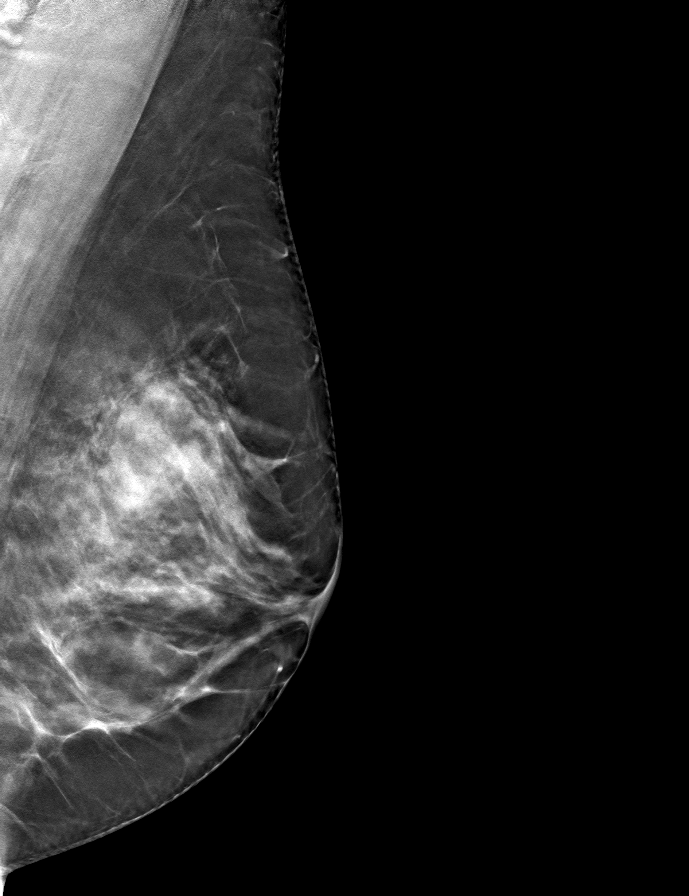

[R CC tomo · tomo slice 41/80.0]
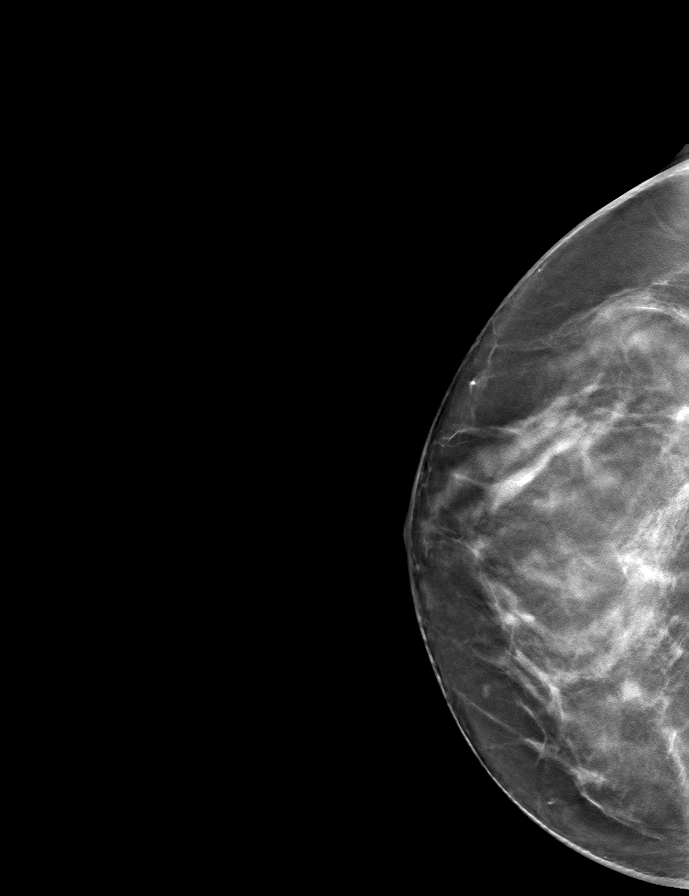

[R MLO tomo · tomo slice 37/73.0]
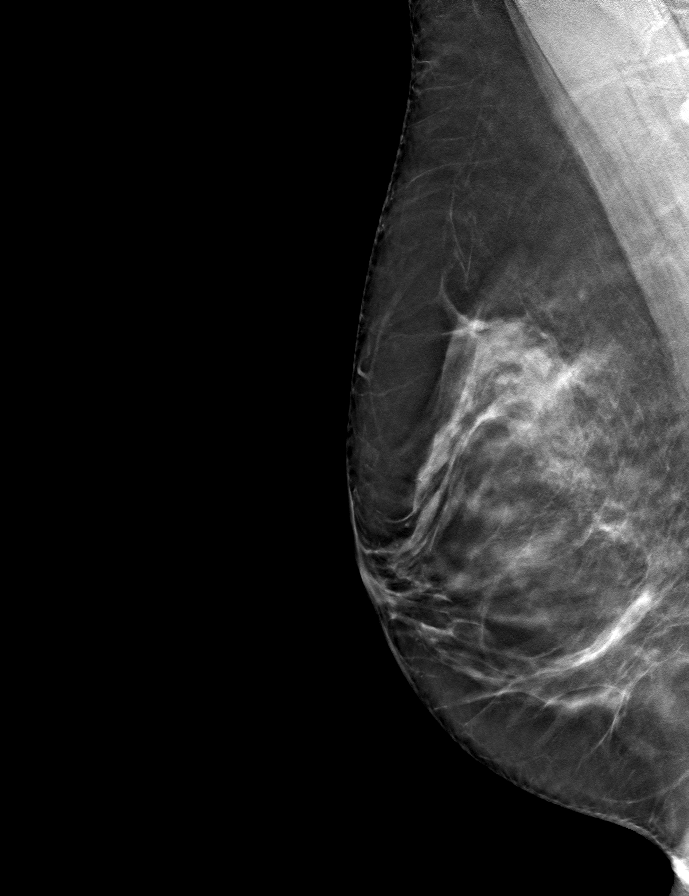

[9 of 24 positions shown; findings below may reference images not displayed]

ACR Breast Density Category c: The breast tissue is heterogeneously
dense, which may obscure small masses.
FINDINGS: There are no findings suspicious for malignancy. Images were
processed with CAD.
IMPRESSION: No mammographic evidence of malignancy. A result letter of this
screening mammogram will be mailed directly to the patient.

RECOMMENDATION:
Screening mammogram in one year. (Code:[5V])

BI-RADS CATEGORY  1: Negative.

## 2019-09-01 ENCOUNTER — Ambulatory Visit: Payer: BC Managed Care – PPO | Attending: Internal Medicine

## 2019-09-01 DIAGNOSIS — Z23 Encounter for immunization: Secondary | ICD-10-CM | POA: Insufficient documentation

## 2019-09-01 NOTE — Progress Notes (Signed)
   Covid-19 Vaccination Clinic  Name:  Cassandra [name]    MRN: [id number] DOB: 1956-03-06  09/01/2019  Cassandra [name] was observed post Covid-19 immunization for 15 minutes without incident. She was provided with Vaccine Information Sheet and instruction to access the V-Safe system.   Cassandra [name] was instructed to call 911 with any severe reactions post vaccine: Marland Kitchen Difficulty breathing  . Swelling of face and throat  . A fast heartbeat  . A bad rash all over body  . Dizziness and weakness   Immunizations Administered    Name Date Dose VIS Date Route   Moderna COVID-19 Vaccine 09/01/2019  2:15 PM 0.5 mL 05/29/2019 Intramuscular   Manufacturer: Moderna   Lot: [id number]   [id number]

## 2019-09-17 NOTE — Progress Notes (Addendum)
Gynecology Annual Exam  PCP: Glendon Axe, MD  Chief Complaint:  Chief Complaint  Patient presents with  . Gynecologic Exam    History of Present Illness:Cassandra [name] presents today for her annual exam. She is a 64 year old Caucasian/White female, G 3 P 2 0 1 2, whose LMP was in the distant past. Her menses are absent and she is postmenopausal. She has had no spotting.  She does not have central weight gain, hot flashes, joint pain, mood changes, and night sweats.   The patient's past medical history is remarkable for osteopenia.   Since her last annual GYN exam dated 09/14/2018,  she has been battling plantar fasciitis. Did not improve with orthotics and steroid injections and is currently seeing a chiropractor for acupuncture and exercises. She also reports having a pruritic rash over a couple of varicose veins in her medial thighs bilaterally. Has been applying Vaseline to the area without relief.Would like to see a Vein Specialist. She has also received her first Moderna vaccine for Covid on 09/01/2019.  She is not currently sexually active. She does have vaginal dryness. She was using lubricants during IC. Her most recent pap smear was obtained 09/14/18 and was NIL. Her most recent mammogram obtained on 12/14/2018 was normal. There is a positive history of breast cancer in her paternal aunt. Genetic testing was not done. There is no family history of ovarian cancer. The patient does do monthly self breast exams.  She had a colonoscopy in 07/2015 that was normal. Her next colonoscopy is due in 2022  She had a recent DEXA scan obtained 05/30/2017  that showed osteopenia in the femoral neck (T score -2.0). She is taking calcium and vitamin D3 supplements The patient does not smoke.  The patient does drink 0-2 mixed drinks/day The patient does not use illegal drugs.  The patient exercises by walking. 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 Her BMI is 26.46 kg/m2 The patient does get  adequate calcium in her diet and with her supplement She has had a recent cholesterol screen in 2018 that was borderline elevated. (TC 240, tri 192, HDL43, LDL 158)   The patient denies current symptoms of depression.    Review of Systems: Review of Systems  Constitutional: Negative for chills, fever and weight loss.  HENT: Negative for congestion, sinus pain and sore throat.   Eyes: Negative for blurred vision and pain.  Respiratory: Negative for cough, hemoptysis, shortness of breath and wheezing.   Cardiovascular: Negative for chest pain, palpitations and leg swelling.       Positive for varicose veins  Gastrointestinal: Negative for abdominal pain, blood in stool, diarrhea, heartburn, nausea and vomiting.  Genitourinary: Negative for dysuria, frequency, hematuria and urgency.       Positive for vaginal dryness  Musculoskeletal: Negative for back pain, joint pain and myalgias.  Skin: Positive for itching and rash.  Neurological: Negative for dizziness, tingling and headaches.  Endo/Heme/Allergies: Negative for environmental allergies and polydipsia. Does not bruise/bleed easily.       Negative for hirsutism   Psychiatric/Behavioral: Negative for depression. The patient has insomnia (trouble staying asleep). The patient is not nervous/anxious.     Past Medical History:  Past Medical History:  Diagnosis Date  . Colon polyps   . Osteopenia after menopause 05/2017   femoral neck T score -2.0    Past Surgical History:  Past Surgical History:  Procedure Laterality Date  . COLONOSCOPY  07/2015  WNL  . COLONOSCOPY  2008/2011  . OVARIAN CYST REMOVAL  1992   dermoid-Dr [place]  . TUBAL LIGATION  1993    Family History:  Family History  Problem Relation Age of Onset  . Breast cancer Paternal Aunt 20  . Diabetes Mother   . Osteoporosis Mother   . Hyperlipidemia Father   . Rheumatic fever Father   . Valvular heart disease Father   . Colon cancer Paternal Grandmother   .  Breast cancer Sister 26    Social History:  Social History   Socioeconomic History  . Marital status: Married    Spouse name: Not on file  . Number of children: 2  . Years of education: Not on file  . Highest education level: Not on file  Occupational History  . Occupation: Pharmacist, hospital  . Occupation: Farmer  Tobacco Use  . Smoking status: Former Research scientist (life sciences)  . Smokeless tobacco: Never Used  . Tobacco comment: Quit smoking 1987  Substance and Sexual Activity  . Alcohol use: Yes    Comment: 0-2 mixed drinks a day  . Drug use: No  . Sexual activity: Not Currently    Birth control/protection: Post-menopausal  Other Topics Concern  . Not on file  Social History Narrative  . Not on file   Social Determinants of Health   Financial Resource Strain:   . Difficulty of Paying Living Expenses:   Food Insecurity:   . Worried About Charity fundraiser in the Last Year:   . Arboriculturist in the Last Year:   Transportation Needs:   . Film/video editor (Medical):   Marland Kitchen Lack of Transportation (Non-Medical):   Physical Activity:   . Days of Exercise per Week:   . Minutes of Exercise per Session:   Stress:   . Feeling of Stress :   Social Connections:   . Frequency of Communication with Friends and Family:   . Frequency of Social Gatherings with Friends and Family:   . Attends Religious Services:   . Active Member of Clubs or Organizations:   . Attends Archivist Meetings:   Marland Kitchen Marital Status:   Intimate Partner Violence:   . Fear of Current or Ex-Partner:   . Emotionally Abused:   Marland Kitchen Physically Abused:   . Sexually Abused:     Allergies:  Allergies  Allergen Reactions  . Penicillin G Hives and Swelling    Rxn as a child    Medications:  Current Outpatient Medications on File Prior to Visit  Medication Sig Dispense Refill  . calcium-vitamin D (OSCAL WITH D) 500-200 MG-UNIT TABS tablet Take by mouth.    . Cholecalciferol (VITAMIN D-1000 MAX ST) 1000 units tablet  Take by mouth.    . Melatonin 3 MG TABS Take by mouth at bedtime as needed.    . Multiple Vitamins-Minerals (MULTIVITAMIN ADULT PO) Take by mouth.     No current facility-administered medications on file prior to visit.    Physical Exam Vitals: BP 132/88   Pulse 66   Ht 5\' 8"  (1.727 m)   Wt 174 lb (78.9 kg)   BMI 26.46 kg/m  General: WF in NAD HEENT: normocephalic, anicteric Neck: no thyroid enlargement, no palpable nodules, no cervical lymphadenopathy  Pulmonary: No increased work of breathing, CTAB Cardiovascular: RRR, without murmur  Breast: Breast symmetrical, no tenderness, no palpable nodules or masses, no skin or nipple retraction present, no nipple discharge.  No axillary, infraclavicular or supraclavicular lymphadenopathy. Abdomen: Soft, non-tender, non-distended.  Umbilicus without lesions.  No hepatomegaly or masses palpable. No evidence of hernia. Genitourinary:  External: Atrophic changes  Normal urethral meatus, normal Bartholin's and Skene's glands.    Vagina: flattened rugae, no evidence of prolapse.    Cervix: Grossly normal in appearance, no bleeding, non-tender  Uterus: Anteverted to midplane, normal size, shape, and consistency, mobile, and non-tender  Adnexa: No adnexal masses, non-tender  Rectal: deferred  Lymphatic: no evidence of inguinal lymphadenopathy Extremities: moderate varicose veins bilateral lower extremities. Two areas of inflammation on upper inner thighs over some  varicose veins (there are some red satellite lesions). Neurologic: Grossly intact Psychiatric: mood appropriate, affect full     Assessment: 64 y.o. annual gyn exam Varicose veins with some inflammation of the skin above some veins on her thighs  Kenalog cream 0.1% -apply BID to inflamed skin Osteopenia  Plan:  1) Breast cancer screening - recommend monthly self breast exam and annual mamogram. Mammogram ordered and patient to schedule for after 6/18  2) Colon cancer  screening-colonoscopy UTD. Due 2022  3) Cervical cancer screening - Pap was done. ASCCP guidelines and rational discussed.  Patient opts for yearly screening interval. Pap sent to [facility].  4) Osteoporosis prevention-discussed calcium and vitamin D3 requirements and the role of exercise in preventing osteoporosis. DEXA scan ordered  5) Routine healthcare maintenance including cholesterol and diabetes screening ordered. To schedule when fasting  6) Will try to get appointment at [facility] regarding varicose veins. If she need a referral -she is to let me know. RTO in the 2-3 weeks for follow up on inflammation if she is not seen by the vein specialists by that time.   6) RTO 1 year and prn   Dalia Heading, CNM

## 2019-09-18 ENCOUNTER — Encounter: Payer: Self-pay | Admitting: Certified Nurse Midwife

## 2019-09-18 ENCOUNTER — Ambulatory Visit (INDEPENDENT_AMBULATORY_CARE_PROVIDER_SITE_OTHER): Payer: BC Managed Care – PPO | Admitting: Certified Nurse Midwife

## 2019-09-18 ENCOUNTER — Other Ambulatory Visit: Payer: Self-pay

## 2019-09-18 VITALS — BP 132/88 | HR 66 | Ht 68.0 in | Wt 174.0 lb

## 2019-09-18 DIAGNOSIS — I8312 Varicose veins of left lower extremity with inflammation: Secondary | ICD-10-CM

## 2019-09-18 DIAGNOSIS — E782 Mixed hyperlipidemia: Secondary | ICD-10-CM

## 2019-09-18 DIAGNOSIS — M722 Plantar fascial fibromatosis: Secondary | ICD-10-CM

## 2019-09-18 DIAGNOSIS — I8311 Varicose veins of right lower extremity with inflammation: Secondary | ICD-10-CM

## 2019-09-18 DIAGNOSIS — Z01419 Encounter for gynecological examination (general) (routine) without abnormal findings: Secondary | ICD-10-CM | POA: Diagnosis not present

## 2019-09-18 DIAGNOSIS — Z131 Encounter for screening for diabetes mellitus: Secondary | ICD-10-CM

## 2019-09-18 DIAGNOSIS — Z13820 Encounter for screening for osteoporosis: Secondary | ICD-10-CM

## 2019-09-18 DIAGNOSIS — Z1239 Encounter for other screening for malignant neoplasm of breast: Secondary | ICD-10-CM

## 2019-09-18 MED ORDER — TRIAMCINOLONE ACETONIDE 0.1 % EX CREA: 1.0000 "application " | TOPICAL_CREAM | Freq: Two times a day (BID) | CUTANEOUS | 1 refills | Status: DC

## 2019-09-19 ENCOUNTER — Encounter: Payer: Self-pay | Admitting: Certified Nurse Midwife

## 2019-09-19 DIAGNOSIS — I8311 Varicose veins of right lower extremity with inflammation: Secondary | ICD-10-CM | POA: Insufficient documentation

## 2019-09-19 DIAGNOSIS — M722 Plantar fascial fibromatosis: Secondary | ICD-10-CM | POA: Insufficient documentation

## 2019-09-25 ENCOUNTER — Other Ambulatory Visit: Payer: Self-pay | Admitting: Certified Nurse Midwife

## 2019-09-25 DIAGNOSIS — Z13820 Encounter for screening for osteoporosis: Secondary | ICD-10-CM

## 2019-09-29 ENCOUNTER — Ambulatory Visit: Payer: BC Managed Care – PPO | Attending: Internal Medicine

## 2019-09-29 DIAGNOSIS — Z23 Encounter for immunization: Secondary | ICD-10-CM

## 2019-09-29 NOTE — Progress Notes (Signed)
   Covid-19 Vaccination Clinic  Name:  Cassandra [name]    MRN: [id number] DOB: [birth year]  09/29/2019  [name] was observed post Covid-19 immunization for 15 minutes without incident. She was provided with Vaccine Information Sheet and instruction to access the V-Safe system.   [name] was instructed to call 911 with any severe reactions post vaccine: Marland Kitchen Difficulty breathing  . Swelling of face and throat  . A fast heartbeat  . A bad rash all over body  . Dizziness and weakness   Immunizations Administered    Name Date Dose VIS Date Route   Moderna COVID-19 Vaccine 09/29/2019  9:12 AM 0.5 mL 05/29/2019 Intramuscular   Manufacturer: Levan Hurst   LotSS:[id number]   BrushDW:[id number]

## 2019-10-24 ENCOUNTER — Telehealth: Payer: Self-pay | Admitting: Certified Nurse Midwife

## 2019-10-24 NOTE — Telephone Encounter (Signed)
Called patient with results of DEXA. No worsening of osteopenia. FRAX scores 12% and 1.8%. Continue calcium and vitamin D3 supplementation and exercise.  Dalia Heading, CNM

## 2019-11-01 ENCOUNTER — Ambulatory Visit: Payer: BC Managed Care – PPO | Admitting: Adult Health

## 2019-11-01 ENCOUNTER — Other Ambulatory Visit: Payer: Self-pay

## 2019-11-01 ENCOUNTER — Encounter: Payer: Self-pay | Admitting: Adult Health

## 2019-11-01 VITALS — BP 128/82 | HR 63 | Temp 96.8°F | Resp 16 | Ht 67.0 in | Wt 173.2 lb

## 2019-11-01 DIAGNOSIS — M722 Plantar fascial fibromatosis: Secondary | ICD-10-CM

## 2019-11-01 DIAGNOSIS — M8580 Other specified disorders of bone density and structure, unspecified site: Secondary | ICD-10-CM

## 2019-11-01 DIAGNOSIS — Z0000 Encounter for general adult medical examination without abnormal findings: Secondary | ICD-10-CM

## 2019-11-01 DIAGNOSIS — E559 Vitamin D deficiency, unspecified: Secondary | ICD-10-CM | POA: Diagnosis not present

## 2019-11-01 DIAGNOSIS — E7800 Pure hypercholesterolemia, unspecified: Secondary | ICD-10-CM

## 2019-11-01 DIAGNOSIS — Z6827 Body mass index (BMI) 27.0-27.9, adult: Secondary | ICD-10-CM

## 2019-11-01 DIAGNOSIS — Z780 Asymptomatic menopausal state: Secondary | ICD-10-CM

## 2019-11-01 NOTE — Progress Notes (Signed)
New patient visit   Patient: Cassandra [name]   DOB: 10/07/1955   64 y.o. Female  MRN: [id number] Visit Date: 11/01/2019  Today's healthcare provider: Marcille Buffy, FNP   Clint Bolder as a scribe for [place], FNP.,have documented all relevant documentation on the behalf of Cassandra Hampshire Kearstyn Avitia, FNP,as directed by  Marcille Buffy, FNP while in the presence of Marcille Buffy, [place].  Chief Complaint  Patient presents with  . New Patient (Initial Visit)   Subjective    Cassandra [name] is a 64 y.o. female who presents today as a new patient to establish care.  HPI  Patient presents in office today to establish care, she comes to our office from [facility]. Patient reports that she feels well and has no concerns to address. Patient reports that she follows a balanced diet and states that she is exercising at least 3-4x a week. Patient reports that she sleeps well, sleeping on average 8hrs a night. Patient reports right foot plantar fascitis, she states that he has been doing stretching exercises at home and wears a boot to help with discomfort.  Patient  denies any fever, body aches,chills, rash, chest pain, shortness of breath, nausea, vomiting, or diarrhea.  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 Is very active she walks which has been limited with her plantar fasciitis but she is receiving treatment is improving.  She rides a bike daily.  Ports she feels well and has no other concerns at this visit.  Denies dizziness lightheadedness or presyncopal or syncopal episodes. Patient  denies any fever, body aches,chills, rash, chest pain, shortness of breath, nausea, vomiting, or diarrhea.   Past Medical History:  Diagnosis Date  . Colon  polyps   . Osteopenia after menopause 05/2017   femoral neck T score -2.0   Past Surgical History:  Procedure Laterality Date  . COLONOSCOPY  07/2015   WNL  . COLONOSCOPY  2008/2011  . OVARIAN CYST REMOVAL  1992   dermoid-Dr [place]  . [place]   Family Status  Relation Name Status  . Ethlyn Daniels  Deceased at age 64os  . Mother  Deceased at age 75  . Father  Deceased at age 48  . PGM  (Not Specified)  . Sister  (Not Specified)   Family History  Problem Relation Age of Onset  . Breast cancer Paternal Aunt 92  . Diabetes Mother   . Osteoporosis Mother   . Hyperlipidemia Father   . Rheumatic fever Father   . Valvular heart disease Father   . Colon cancer Paternal Grandmother   . Breast cancer Sister 52   Social History   Socioeconomic History  . Marital status: Married    Spouse name: Not on file  . Number of children: 2  . Years of education: Not on file  . Highest education level: Not on file  Occupational History  . Occupation: Pharmacist, hospital  . Occupation: Farmer  Tobacco Use  . Smoking status: Former Research scientist (life sciences)  . Smokeless tobacco: Never Used  . Tobacco comment: Quit smoking 1987  Substance and Sexual Activity  . Alcohol use: Yes    Comment: 0-2 mixed drinks a day  . Drug use: No  . Sexual activity: Not Currently    Birth  control/protection: Post-menopausal  Other Topics Concern  . Not on file  Social History Narrative  . Not on file   Social Determinants of Health   Financial Resource Strain:   . Difficulty of Paying Living Expenses:   Food Insecurity:   . Worried About Charity fundraiser in the Last Year:   . Arboriculturist in the Last Year:   Transportation Needs:   . Film/video editor (Medical):   Marland Kitchen Lack of Transportation (Non-Medical):   Physical Activity:   . Days of Exercise per Week:   . Minutes of Exercise per Session:   Stress:   . Feeling of Stress :   Social Connections:   . Frequency of Communication with Friends and Family:     . Frequency of Social Gatherings with Friends and Family:   . Attends Religious Services:   . Active Member of Clubs or Organizations:   . Attends Archivist Meetings:   Marland Kitchen Marital Status:    Outpatient Medications Prior to Visit  Medication Sig  . calcium-vitamin D (OSCAL WITH D) 500-200 MG-UNIT TABS tablet Take by mouth.  . Multiple Vitamins-Minerals (MULTIVITAMIN ADULT PO) Take by mouth.  . Melatonin 3 MG TABS Take by mouth at bedtime as needed.  . triamcinolone cream (KENALOG) 0.1 % Apply 1 application topically 2 (two) times daily. (Patient not taking: Reported on 11/01/2019)  . [DISCONTINUED] Cholecalciferol (VITAMIN D-1000 MAX ST) 1000 units tablet Take by mouth.   No facility-administered medications prior to visit.   Allergies  Allergen Reactions  . Penicillin G Hives and Swelling    Rxn as a child    Immunization History  Administered Date(s) Administered  . Moderna SARS-COVID-2 Vaccination 09/01/2019, 09/29/2019    Health Maintenance  Topic Date Due  . Hepatitis C Screening  Never done  . HIV Screening  Never done  . INFLUENZA VACCINE  01/27/2020  . MAMMOGRAM  12/13/2020  . PAP SMEAR-Modifier  09/13/2021  . COLONOSCOPY  07/29/2025  . TETANUS/TDAP  05/13/2027  . COVID-19 Vaccine  Completed    Patient Care Team: Yamen Castrogiovanni, Kelby Aline, FNP as PCP - General (Family Medicine) Rourke Mcquitty, Kelby Aline, FNP (Family Medicine)  Review of Systems  Constitutional: Negative for activity change, appetite change, chills, diaphoresis, fatigue, fever and unexpected weight change.  HENT: Negative.   Eyes: Negative.   Respiratory: Negative.  Negative for apnea, cough, choking, chest tightness, shortness of breath, wheezing and stridor.   Cardiovascular: Negative.  Negative for chest pain, palpitations and leg swelling.  Gastrointestinal: Negative.   Endocrine: Negative.   Musculoskeletal: Positive for gait problem (Right foot she has plantar fasciitis she has seen  orthopedics.  She has not had steroid injections.  She is seeing chiropractor is doing acupuncture and dry needling and this is helping her.  Is also wearing a brace at night.  She is doing stretching .). Negative for arthralgias, back pain, joint swelling, myalgias, neck pain and neck stiffness.  Allergic/Immunologic:       Penicillin allergy  Neurological: Negative for dizziness.  Psychiatric/Behavioral: Negative.   All other systems reviewed and are negative.   Last CBC No results found for: WBC, HGB, HCT, MCV, MCH, RDW, PLT Last metabolic panel No results found for: GLUCOSE, NA, K, CL, CO2, BUN, CREATININE, GFRNONAA, GFRAA, CALCIUM, PHOS, PROT, ALBUMIN, LABGLOB, AGRATIO, BILITOT, ALKPHOS, AST, ALT, ANIONGAP Last lipids No results found for: CHOL, HDL, LDLCALC, LDLDIRECT, TRIG, CHOLHDL Last hemoglobin A1c No results found for: HGBA1C Last thyroid  functions No results found for: TSH, T3TOTAL, T4TOTAL, THYROIDAB Last vitamin D No results found for: 25OHVITD2, 25OHVITD3, VD25OH    Objective    BP 128/82   Pulse 63   Temp (!) 96.8 F (36 C) (Oral)   Resp 16   Ht 5\' 7"  (1.702 m)   Wt 173 lb 3.2 oz (78.6 kg)   SpO2 98%   BMI 27.13 kg/m  Physical Exam Vitals reviewed.  Constitutional:      Appearance: Normal appearance. She is normal weight.     Comments: Patient appers well, not sickly. Speaking in complete sentences. Patient moves on and off of exam table and in room without difficulty. Gait is normal in hall and in room. Patient is oriented to person place time and situation. Patient answers questions appropriately and engages eye contact and verbal dialect with provider.   HENT:     Head: Normocephalic and atraumatic.     Right Ear: External ear normal.     Left Ear: External ear normal.  Eyes:     Extraocular Movements: Extraocular movements intact.     Conjunctiva/sclera: Conjunctivae normal.  Cardiovascular:     Rate and Rhythm: Normal rate and regular rhythm.      Pulses: Normal pulses.     Heart sounds: Normal heart sounds.  Pulmonary:     Effort: Pulmonary effort is normal. No respiratory distress.     Breath sounds: Normal breath sounds. No stridor. No wheezing, rhonchi or rales.  Chest:     Chest wall: No tenderness.     Breasts:        Right: Normal. No inverted nipple, nipple discharge, skin change or tenderness.        Left: Normal. No inverted nipple, nipple discharge, skin change or tenderness.  Abdominal:     General: There is no distension.     Palpations: Abdomen is soft.     Tenderness: There is no abdominal tenderness.  Genitourinary:    Comments: He recently had gynecology exam done at [facility].  He also had breast exam and rectal exam done at that time as well denies any rectal bleeding or pain. Musculoskeletal:        General: Normal range of motion.     Cervical back: Normal range of motion and neck supple.       Legs:     Comments: Right prominent vascular   Lymphadenopathy:     Upper Body:     Right upper body: No supraclavicular, axillary or pectoral adenopathy.     Left upper body: No supraclavicular, axillary or pectoral adenopathy.  Skin:    General: Skin is warm and dry.     Capillary Refill: Capillary refill takes less than 2 seconds.     Coloration: Skin is not jaundiced or pale.     Findings: No bruising, erythema, lesion or rash.  Neurological:     General: No focal deficit present.     Mental Status: She is alert and oriented to person, place, and time.     GCS: GCS eye subscore is 4. GCS verbal subscore is 5. GCS motor subscore is 6.     Cranial Nerves: Cranial nerves are intact. No cranial nerve deficit.     Sensory: Sensation is intact. No sensory deficit.     Motor: Motor function is intact. No weakness.     Coordination: Coordination is intact. Coordination normal.     Gait: Gait normal.     Deep Tendon Reflexes: Reflexes normal.  Reflex Scores:      Tricep reflexes are 2+ on the right side and 2+  on the left side.      Brachioradialis reflexes are 2+ on the right side and 2+ on the left side.      Achilles reflexes are 2+ on the right side and 2+ on the left side.    Comments: Neurologic exam:  Speech clear, pupils equal round reactive to light, extraocular movements intact  Normal peripheral visual fields Cranial nerves III through XII normal including no facial droop Follows commands, moves all extremities x4, normal strength to bilateral upper and lower extremities at all major muscle groups including grip Sensation normal to light touch and pinprick Coordination intact, no limb ataxia, finger-nose-finger normal, heel shin normal bilaterally Rapid alternating movements normal No pronator drift Gait normal Can heal and toe walk without weakness.   Psychiatric:        Attention and Perception: Attention normal.        Mood and Affect: Mood normal.        Speech: Speech normal.        Behavior: Behavior normal. Behavior is cooperative.        Thought Content: Thought content normal.        Cognition and Memory: Cognition normal.        Judgment: Judgment normal.      Depression Screen PHQ 2/9 Scores 11/01/2019  PHQ - 2 Score 0  PHQ- 9 Score 0   No results found for any visits on 11/01/19.  Assessment & Plan     Pure hypercholesterolemia - Plan: Lipid panel, TSH  Vitamin D deficiency - Plan: Vitamin D (25 hydroxy)  Routine health maintenance - Plan: CBC with Differential/Platelet, Comprehensive metabolic panel, Lipid panel, TSH, Vitamin D (25 hydroxy)  Osteopenia after menopause - Plan: Comprehensive metabolic panel, Vitamin D (25 hydroxy)  Plantar fasciitis, right - Plan: CBC with Differential/Platelet     He is fairly up-to-date on her health maintenance, she sees eye doctor once yearly and she also sees dentist biannually.  Patient has no other concerns at this time.  Recommended daily exercise and healthy diet lifestyle.  Call with questions or concerns or  changes in health occur. She would like to have labs done, she is not fasting today she will return to the office for walk-in labs and we will result them to her MyChart or give her a call within 72 hours.  Red flags of health discussed and patient when to return back to the office recommended yearly physical with labs unless otherwise pending labs return abnormal then sooner follow-up will be scheduled. Advised patient call the office or your primary care doctor for an appointment if no improvement within 72 hours or if any symptoms change or worsen at any time  Advised ER or urgent Care if after hours or on weekend. Call 911 for emergency symptoms at any time.Patinet verbalized understanding of all instructions given/reviewed and treatment plan and has no further questions or concerns at this time.     IWellington Hampshire Madisun Hargrove, FNP, have reviewed all documentation for this visit. The documentation on 11/01/19 for the exam, diagnosis, procedures, and orders are all accurate and complete.    Marcille Buffy, [place] [phone] (phone) [phone] (fax)  [place]

## 2019-11-01 NOTE — Patient Instructions (Addendum)
Preventive Care 40-64 Years Old, Female Preventive care refers to visits with your health care provider and lifestyle choices that can promote health and wellness. This includes:  A yearly physical exam. This may also be called an annual well check.  Regular dental visits and eye exams.  Immunizations.  Screening for certain conditions.  Healthy lifestyle choices, such as eating a healthy diet, getting regular exercise, not using drugs or products that contain nicotine and tobacco, and limiting alcohol use. What can I expect for my preventive care visit? Physical exam Your health care provider will check your:  Height and weight. This may be used to calculate body mass index (BMI), which tells if you are at a healthy weight.  Heart rate and blood pressure.  Skin for abnormal spots. Counseling Your health care provider may ask you questions about your:  Alcohol, tobacco, and drug use.  Emotional well-being.  Home and relationship well-being.  Sexual activity.  Eating habits.  Work and work environment.  Method of birth control.  Menstrual cycle.  Pregnancy history. What immunizations do I need?  Influenza (flu) vaccine  This is recommended every year. Tetanus, diphtheria, and pertussis (Tdap) vaccine  You may need a Td booster every 10 years. Varicella (chickenpox) vaccine  You may need this if you have not been vaccinated. Zoster (shingles) vaccine  You may need this after age 60. Measles, mumps, and rubella (MMR) vaccine  You may need at least one dose of MMR if you were born in 1957 or later. You may also need a second dose. Pneumococcal conjugate (PCV13) vaccine  You may need this if you have certain conditions and were not previously vaccinated. Pneumococcal polysaccharide (PPSV23) vaccine  You may need one or two doses if you smoke cigarettes or if you have certain conditions. Meningococcal conjugate (MenACWY) vaccine  You may need this if you  have certain conditions. Hepatitis A vaccine  You may need this if you have certain conditions or if you travel or work in places where you may be exposed to hepatitis A. Hepatitis B vaccine  You may need this if you have certain conditions or if you travel or work in places where you may be exposed to hepatitis B. Haemophilus influenzae type b (Hib) vaccine  You may need this if you have certain conditions. Human papillomavirus (HPV) vaccine  If recommended by your health care provider, you may need three doses over 6 months. You may receive vaccines as individual doses or as more than one vaccine together in one shot (combination vaccines). Talk with your health care provider about the risks and benefits of combination vaccines. What tests do I need? Blood tests  Lipid and cholesterol levels. These may be checked every 5 years, or more frequently if you are over 50 years old.  Hepatitis C test.  Hepatitis B test. Screening  Lung cancer screening. You may have this screening every year starting at age 55 if you have a 30-pack-year history of smoking and currently smoke or have quit within the past 15 years.  Colorectal cancer screening. All adults should have this screening starting at age 50 and continuing until age 75. Your health care provider may recommend screening at age 45 if you are at increased risk. You will have tests every 1-10 years, depending on your results and the type of screening test.  Diabetes screening. This is done by checking your blood sugar (glucose) after you have not eaten for a while (fasting). You may have this   done every 1-3 years.  Mammogram. This may be done every 1-2 years. Talk with your health care provider about when you should start having regular mammograms. This may depend on whether you have a family history of breast cancer.  BRCA-related cancer screening. This may be done if you have a family history of breast, ovarian, tubal, or peritoneal  cancers.  Pelvic exam and Pap test. This may be done every 3 years starting at age 69. Starting at age 39, this may be done every 5 years if you have a Pap test in combination with an HPV test. Other tests  Sexually transmitted disease (STD) testing.  Bone density scan. This is done to screen for osteoporosis. You may have this scan if you are at high risk for osteoporosis. Follow these instructions at home: Eating and drinking  Eat a diet that includes fresh fruits and vegetables, whole grains, lean protein, and low-fat dairy.  Take vitamin and mineral supplements as recommended by your health care provider.  Do not drink alcohol if: ? Your health care provider tells you not to drink. ? You are pregnant, may be pregnant, or are planning to become pregnant.  If you drink alcohol: ? Limit how much you have to 0-1 drink a day. ? Be aware of how much alcohol is in your drink. In the [place] one drink equals one 12 oz bottle of beer (355 mL), one 5 oz glass of wine (148 mL), or one 1 oz glass of hard liquor (44 mL). Lifestyle  Take daily care of your teeth and gums.  Stay active. Exercise for at least 30 minutes on 5 or more days each week.  Do not use any products that contain nicotine or tobacco, such as cigarettes, e-cigarettes, and chewing tobacco. If you need help quitting, ask your health care provider.  If you are sexually active, practice safe sex. Use a condom or other form of birth control (contraception) in order to prevent pregnancy and STIs (sexually transmitted infections).  If told by your health care provider, take low-dose aspirin daily starting at age 52. What's next?  Visit your health care provider once a year for a well check visit.  Ask your health care provider how often you should have your eyes and teeth checked.  Stay up to date on all vaccines. This information is not intended to replace advice given to you by your health care provider. Make sure you  discuss any questions you have with your health care provider. Document Revised: 02/23/2018 Document Reviewed: 02/23/2018 Elsevier Patient Education  [place].  Plantar Fasciitis  Plantar fasciitis is a painful foot condition that affects the heel. It occurs when the band of tissue that connects the toes to the heel bone (plantar fascia) becomes irritated. This can happen as the result of exercising too much or doing other repetitive activities (overuse injury). The pain from plantar fasciitis can range from mild irritation to severe pain that makes it difficult to walk or move. The pain is usually worse in the morning after sleeping, or after sitting or lying down for a while. Pain may also be worse after long periods of walking or standing. What are the causes? This condition may be caused by:  Standing for long periods of time.  Wearing shoes that do not have good arch support.  Doing activities that put stress on joints (high-impact activities), including running, aerobics, and ballet.  Being overweight.  An abnormal way of walking (gait).  Tight muscles  in the back of your lower leg (calf).  High arches in your feet.  Starting a new athletic activity. What are the signs or symptoms? The main symptom of this condition is heel pain. Pain may:  Be worse with first steps after a time of rest, especially in the morning after sleeping or after you have been sitting or lying down for a while.  Be worse after long periods of standing still.  Decrease after 30-45 minutes of activity, such as gentle walking. How is this diagnosed? This condition may be diagnosed based on your medical history and your symptoms. Your health care provider may ask questions about your activity level. Your health care provider will do a physical exam to check for:  A tender area on the bottom of your foot.  A high arch in your foot.  Pain when you move your foot.  Difficulty moving your  foot. You may have imaging tests to confirm the diagnosis, such as:  X-rays.  Ultrasound.  MRI. How is this treated? Treatment for plantar fasciitis depends on how severe your condition is. Treatment may include:  Rest, ice, applying pressure (compression), and raising the affected foot (elevation). This may be called RICE therapy. Your health care provider may recommend RICE therapy along with over-the-counter pain medicines to manage your pain.  Exercises to stretch your calves and your plantar fascia.  A splint that holds your foot in a stretched, upward position while you sleep (night splint).  Physical therapy to relieve symptoms and prevent problems in the future.  Injections of steroid medicine (cortisone) to relieve pain and inflammation.  Stimulating your plantar fascia with electrical impulses (extracorporeal shock wave therapy). This is usually the last treatment option before surgery.  Surgery, if other treatments have not worked after 12 months. Follow these instructions at home:  Managing pain, stiffness, and swelling  If directed, put ice on the painful area: ? Put ice in a plastic bag, or use a frozen bottle of water. ? Place a towel between your skin and the bag or bottle. ? Roll the bottom of your foot over the bag or bottle. ? Do this for 20 minutes, 2-3 times a day.  Wear athletic shoes that have air-sole or gel-sole cushions, or try wearing soft shoe inserts that are designed for plantar fasciitis.  Raise (elevate) your foot above the level of your heart while you are sitting or lying down. Activity  Avoid activities that cause pain. Ask your health care provider what activities are safe for you.  Do physical therapy exercises and stretches as told by your health care provider.  Try activities and forms of exercise that are easier on your joints (low-impact). Examples include swimming, water aerobics, and biking. General instructions  Take  over-the-counter and prescription medicines only as told by your health care provider.  Wear a night splint while sleeping, if told by your health care provider. Loosen the splint if your toes tingle, become numb, or turn cold and blue.  Maintain a healthy weight, or work with your health care provider to lose weight as needed.  Keep all follow-up visits as told by your health care provider. This is important. Contact a health care provider if you:  Have symptoms that do not go away after caring for yourself at home.  Have pain that gets worse.  Have pain that affects your ability to move or do your daily activities. Summary  Plantar fasciitis is a painful foot condition that affects the heel.  It occurs when the band of tissue that connects the toes to the heel bone (plantar fascia) becomes irritated.  The main symptom of this condition is heel pain that may be worse after exercising too much or standing still for a long time.  Treatment varies, but it usually starts with rest, ice, compression, and elevation (RICE therapy) and over-the-counter medicines to manage pain. This information is not intended to replace advice given to you by your health care provider. Make sure you discuss any questions you have with your health care provider. Document Revised: 05/27/2017 Document Reviewed: 04/11/2017 Elsevier Patient Education  2020 [place] and Cholesterol Restricted Eating Plan Getting too much fat and cholesterol in your diet may cause health problems. Choosing the right foods helps keep your fat and cholesterol at normal levels. This can keep you from getting certain diseases. Your doctor may recommend an eating plan that includes:  Total fat: ______% or less of total calories a day.  Saturated fat: ______% or less of total calories a day.  Cholesterol: less than _________mg a day.  Fiber: ______g a day. What are tips for following this plan? Meal planning  At  meals, divide your plate into four equal parts: ? Fill one-half of your plate with vegetables and green salads. ? Fill one-fourth of your plate with whole grains. ? Fill one-fourth of your plate with low-fat (lean) protein foods.  Eat fish that is high in omega-3 fats at least two times a week. This includes mackerel, tuna, sardines, and salmon.  Eat foods that are high in fiber, such as whole grains, beans, apples, broccoli, carrots, peas, and barley. General tips   Work with your doctor to lose weight if you need to.  Avoid: ? Foods with added sugar. ? Fried foods. ? Foods with partially hydrogenated oils.  Limit alcohol intake to no more than 1 drink a day for nonpregnant women and 2 drinks a day for men. One drink equals 12 oz of beer, 5 oz of wine, or 1 oz of hard liquor. Reading food labels  Check food labels for: ? Trans fats. ? Partially hydrogenated oils. ? Saturated fat (g) in each serving. ? Cholesterol (mg) in each serving. ? Fiber (g) in each serving.  Choose foods with healthy fats, such as: ? Monounsaturated fats. ? Polyunsaturated fats. ? Omega-3 fats.  Choose grain products that have whole grains. Look for the word "whole" as the first word in the ingredient list. Cooking  Cook foods using low-fat methods. These include baking, boiling, grilling, and broiling.  Eat more home-cooked foods. Eat at restaurants and buffets less often.  Avoid cooking using saturated fats, such as butter, cream, palm oil, palm kernel oil, and coconut oil. Recommended foods  Fruits  All fresh, canned (in natural juice), or frozen fruits. Vegetables  Fresh or frozen vegetables (raw, steamed, roasted, or grilled). Green salads. Grains  Whole grains, such as whole wheat or whole grain breads, crackers, cereals, and pasta. Unsweetened oatmeal, bulgur, barley, quinoa, or brown rice. Corn or whole wheat flour tortillas. Meats and other protein foods  Ground beef (85% or  leaner), grass-fed beef, or beef trimmed of fat. Skinless chicken or [place]. Ground chicken or [place]. Pork trimmed of fat. All fish and seafood. Egg whites. Dried beans, peas, or lentils. Unsalted nuts or seeds. Unsalted canned beans. Nut butters without added sugar or oil. Dairy  Low-fat or nonfat dairy products, such as skim or 1% milk, 2% or reduced-fat cheeses,  low-fat and fat-free ricotta or cottage cheese, or plain low-fat and nonfat yogurt. Fats and oils  Tub margarine without trans fats. Light or reduced-fat mayonnaise and salad dressings. Avocado. Olive, canola, sesame, or safflower oils. The items listed above may not be a complete list of foods and beverages you can eat. Contact a dietitian for more information. Foods to avoid Fruits  Canned fruit in heavy syrup. Fruit in cream or butter sauce. Fried fruit. Vegetables  Vegetables cooked in cheese, cream, or butter sauce. Fried vegetables. Grains  White bread. White pasta. White rice. Cornbread. Bagels, pastries, and croissants. Crackers and snack foods that contain trans fat and hydrogenated oils. Meats and other protein foods  Fatty cuts of meat. Ribs, chicken wings, bacon, sausage, bologna, salami, chitterlings, fatback, hot dogs, bratwurst, and packaged lunch meats. Liver and organ meats. Whole eggs and egg yolks. Chicken and [place] with skin. Fried meat. Dairy  Whole or 2% milk, cream, half-and-half, and cream cheese. Whole milk cheeses. Whole-fat or sweetened yogurt. Full-fat cheeses. Nondairy creamers and whipped toppings. Processed cheese, cheese spreads, and cheese curds. Beverages  Alcohol. Sugar-sweetened drinks such as sodas, lemonade, and fruit drinks. Fats and oils  Butter, stick margarine, lard, shortening, ghee, or bacon fat. Coconut, palm kernel, and palm oils. Sweets and desserts  Corn syrup, sugars, honey, and molasses. Candy. Jam and jelly. Syrup. Sweetened cereals. Cookies, pies, cakes, donuts, muffins,  and ice cream. The items listed above may not be a complete list of foods and beverages you should avoid. Contact a dietitian for more information. Summary  Choosing the right foods helps keep your fat and cholesterol at normal levels. This can keep you from getting certain diseases.  At meals, fill one-half of your plate with vegetables and green salads.  Eat high-fiber foods, like whole grains, beans, apples, carrots, peas, and barley.  Limit added sugar, saturated fats, alcohol, and fried foods. This information is not intended to replace advice given to you by your health care provider. Make sure you discuss any questions you have with your health care provider. Document Revised: 02/15/2018 Document Reviewed: 03/01/2017 Elsevier Patient Education  [place].

## 2019-11-13 LAB — CBC WITH DIFFERENTIAL/PLATELET
Basophils Absolute: 0 10*3/uL (ref 0.0–0.2)
Basos: 0 %
EOS (ABSOLUTE): 0.1 10*3/uL (ref 0.0–0.4)
Eos: 2 %
Hematocrit: 42.6 % (ref 34.0–46.6)
Hemoglobin: 14.9 g/dL (ref 11.1–15.9)
Immature Grans (Abs): 0 10*3/uL (ref 0.0–0.1)
Immature Granulocytes: 0 %
Lymphocytes Absolute: 1.7 10*3/uL (ref 0.7–3.1)
Lymphs: 32 %
MCH: 33.4 pg — ABNORMAL HIGH (ref 26.6–33.0)
MCHC: 35 g/dL (ref 31.5–35.7)
MCV: 96 fL (ref 79–97)
Monocytes Absolute: 0.5 10*3/uL (ref 0.1–0.9)
Monocytes: 9 %
Neutrophils Absolute: 3.1 10*3/uL (ref 1.4–7.0)
Neutrophils: 57 %
Platelets: 225 10*3/uL (ref 150–450)
RBC: 4.46 x10E6/uL (ref 3.77–5.28)
RDW: 12.2 % (ref 11.7–15.4)
WBC: 5.5 10*3/uL (ref 3.4–10.8)

## 2019-11-13 LAB — COMPREHENSIVE METABOLIC PANEL
ALT: 18 IU/L (ref 0–32)
AST: 14 IU/L (ref 0–40)
Albumin/Globulin Ratio: 2.3 — ABNORMAL HIGH (ref 1.2–2.2)
Albumin: 4.6 g/dL (ref 3.8–4.8)
Alkaline Phosphatase: 100 IU/L (ref 48–121)
BUN/Creatinine Ratio: 16 (ref 12–28)
BUN: 14 mg/dL (ref 8–27)
Bilirubin Total: 0.6 mg/dL (ref 0.0–1.2)
CO2: 22 mmol/L (ref 20–29)
Calcium: 9.5 mg/dL (ref 8.7–10.3)
Chloride: 104 mmol/L (ref 96–106)
Creatinine, Ser: 0.88 mg/dL (ref 0.57–1.00)
GFR calc Af Amer: 80 mL/min/{1.73_m2} (ref >59)
GFR calc non Af Amer: 70 mL/min/{1.73_m2} (ref >59)
Globulin, Total: 2 g/dL (ref 1.5–4.5)
Glucose: 105 mg/dL — ABNORMAL HIGH (ref 65–99)
Potassium: 4 mmol/L (ref 3.5–5.2)
Sodium: 141 mmol/L (ref 134–144)
Total Protein: 6.6 g/dL (ref 6.0–8.5)

## 2019-11-13 LAB — LIPID PANEL
Chol/HDL Ratio: 4 ratio (ref 0.0–4.4)
Cholesterol, Total: 235 mg/dL — ABNORMAL HIGH (ref 100–199)
HDL: 59 mg/dL (ref >39)
LDL Chol Calc (NIH): 151 mg/dL — ABNORMAL HIGH (ref 0–99)
Triglycerides: 140 mg/dL (ref 0–149)
VLDL Cholesterol Cal: 25 mg/dL (ref 5–40)

## 2019-11-13 LAB — VITAMIN D 25 HYDROXY (VIT D DEFICIENCY, FRACTURES): Vit D, 25-Hydroxy: 39.3 ng/mL (ref 30.0–100.0)

## 2019-11-13 LAB — TSH: TSH: 3.4 u[IU]/mL (ref 0.450–4.500)

## 2019-11-13 NOTE — Progress Notes (Signed)
Add on Hemoglobin A 1C for elevated glucose diagnosis.  TSH within normal limits for thyroid function.  CMP within normal limits for electrolytes, kidney and liver function.  CBC is within normal limits no signs of anemia or infection.  Total cholesterol and LDL elevated.  Discuss lifestyle modification with patient e.g. increase exercise, fiber, fruits, vegetables, lean meat, and omega 3/fish intake and decrease saturated fat.  If patient following strict diet and exercise program already please schedule follow up appointment with primary care physician Vitamin D 39.3 within normal, recommend Vitamin D over the counter at 2,000 international units daily by mouth in addition to the calcium vitamin d she is taking 500-200 mg- unit tablet.   Recheck cholesterol and vitamin  D lab in 6 months. Please add lab order.

## 2019-11-14 ENCOUNTER — Telehealth: Payer: Self-pay

## 2019-11-14 NOTE — Telephone Encounter (Signed)
-----   Message from Doreen Beam, [place] sent at 11/13/2019  2:16 PM EDT ----- Add on Hemoglobin A 1C for elevated glucose diagnosis.  TSH within normal limits for thyroid function.  CMP within normal limits for electrolytes, kidney and liver function.  CBC is within normal limits no signs of anemia or infection.  Total cholesterol and LDL elevated.  Discuss lifestyle modification with patient e.g. increase exercise, fiber, fruits, vegetables, lean meat, and omega 3/fish intake and decrease saturated fat.  If patient following strict diet and exercise program already please schedule follow up appointment with primary care physician Vitamin D 39.3 within normal, recommend Vitamin D over the counter at 2,000 international units daily by mouth in addition to the calcium vitamin d she is taking 500-200 mg- unit tablet.   Recheck cholesterol and vitamin  D lab in 6 months. Please add lab order.

## 2019-11-14 NOTE — Telephone Encounter (Signed)
Patient has been advised and additional lab has been requested. KW

## 2019-11-16 ENCOUNTER — Telehealth: Payer: Self-pay

## 2019-11-16 DIAGNOSIS — E7800 Pure hypercholesterolemia, unspecified: Secondary | ICD-10-CM

## 2019-11-16 DIAGNOSIS — E559 Vitamin D deficiency, unspecified: Secondary | ICD-10-CM

## 2019-11-16 LAB — HEMOGLOBIN A1C
Est. average glucose Bld gHb Est-mCnc: 108 mg/dL
Hgb A1c MFr Bld: 5.4 % (ref 4.8–5.6)

## 2019-11-16 LAB — SPECIMEN STATUS REPORT

## 2019-11-16 NOTE — Progress Notes (Signed)
Add on Hemoglobin A 1C for elevated glucose diagnosis is within normal range- not diabetic.  TSH within normal limits for thyroid function.  CMP within normal limits for electrolytes, kidney and liver function.  CBC is within normal limits no signs of anemia or infection.  Total cholesterol and LDL elevated. Discuss lifestyle modification with patient e.g. increase exercise, fiber, fruits, vegetables, lean meat, and omega 3/fish intake and decrease saturated fat. If patient following strict diet and exercise program already please schedule follow up appointment with primary care physician Vitamin D 39.3 within normal, recommend Vitamin D over the counter at 2,000 international units daily by mouth in addition to the calcium vitamin d she is taking 500-200 mg- unit tablet.   Recheck cholesterol and vitamin D lab in 6 months. Please add lab order.

## 2019-11-16 NOTE — Telephone Encounter (Signed)
-----   Message from Doreen Beam, [place] sent at 11/16/2019  8:12 AM EDT ----- Add on Hemoglobin A 1C for elevated glucose diagnosis is within normal range- not diabetic.  TSH within normal limits for thyroid function.  CMP within normal limits for electrolytes, kidney and liver function.  CBC is within normal limits no signs of anemia or infection.  Total cholesterol and LDL elevated. Discuss lifestyle modification with patient e.g. increase exercise, fiber, fruits, vegetables, lean meat, and omega 3/fish intake and decrease saturated fat. If patient following strict diet and exercise program already please schedule follow up appointment with primary care physician Vitamin D 39.3 within normal, recommend Vitamin D over the counter at 2,000 international units daily by mouth in addition to the calcium vitamin d she is taking 500-200 mg- unit tablet.   Recheck cholesterol and vitamin D lab in 6 months. Please add lab order.

## 2019-11-16 NOTE — Addendum Note (Signed)
Addended by: Minette Headland on: 11/16/2019 04:59 PM   Modules accepted: Orders

## 2019-11-16 NOTE — Telephone Encounter (Signed)
Fax already sent to [facility] for HgbA1C as noted below. Future order has been placed for future labs. KW

## 2019-11-16 NOTE — Telephone Encounter (Signed)
Pt given results per Laverna Peace at [facility], "Add on Hemoglobin A 1C for elevated glucose diagnosis is within normal range- not diabetic. TSH within normal limits for thyroid function. CMP within normal limits for electrolytes, kidney and liver function. CBC is within normal limits no signs of anemia or infection. Total cholesterol and LDL elevated. Discuss lifestyle modification with patient e.g. increase exercise, fiber, fruits, vegetables, lean meat, and omega 3/fish intake and decrease saturated fat. If patient following strict diet and exercise program already please schedule follow up appointment with primary care physician Vitamin D 39.3 within normal, recommend Vitamin D over the counter at 2,000 international units daily by mouth in addition to the calcium vitamin d she is taking 500-200 mg- unit tablet. Recheck cholesterol and vitamin D lab in 6 months. Please add lab order."; the pt verbalized understanding, and states that she previously saw her results in [place]; orders not placed; will route to office for notification, and final disposition.

## 2019-11-16 NOTE — Telephone Encounter (Signed)
Left message for patient to call back, sent fax request to [facility] to add on HgbA1C, okay for [facility] nurse triage to advise patient of this and lab results. KW

## 2019-12-06 ENCOUNTER — Other Ambulatory Visit: Payer: Self-pay

## 2019-12-06 ENCOUNTER — Ambulatory Visit (INDEPENDENT_AMBULATORY_CARE_PROVIDER_SITE_OTHER): Payer: BC Managed Care – PPO | Admitting: Physician Assistant

## 2019-12-06 VITALS — BP 138/88 | HR 67 | Temp 96.8°F | Wt 169.9 lb

## 2019-12-06 DIAGNOSIS — E739 Lactose intolerance, unspecified: Secondary | ICD-10-CM | POA: Diagnosis not present

## 2019-12-06 NOTE — Progress Notes (Signed)
Established patient visit   Patient: Cassandra [name]   DOB: June 26, 1956   64 y.o. Female  MRN: [id number] Visit Date: 12/06/2019  Today's healthcare provider: Trinna Post, PA-C   Chief Complaint  Patient presents with  . Bloated  I,Carole Doner M Deairra Halleck,acting as a Education administrator for [employer], PA-C.,have documented all relevant documentation on the behalf of Trinna Post, PA-C,as directed by  Trinna Post, PA-C while in the presence of Trinna Post, PA-C.  Subjective    HPI  Bloated Patient presents today for abdominal bloating for about 3 days now. Patient reports that she has not taking anything for the bloating. Patient states that she drinks plenty of water. Patient thinks she may be lactose intolerance. Patient states that she use to eat yogurt daily but stopped eating it due to causing diarrhea. She reports not having gas. Patient was advised that she will started feeling better in a few days. No weight loss, fevers, joint pain, nausea, vomiting.      Medications: Outpatient Medications Prior to Visit  Medication Sig  . calcium-vitamin D (OSCAL WITH D) 500-200 MG-UNIT TABS tablet Take by mouth.  . Melatonin 3 MG TABS Take by mouth at bedtime as needed.  . Multiple Vitamins-Minerals (MULTIVITAMIN ADULT PO) Take by mouth.  . triamcinolone cream (KENALOG) 0.1 % Apply 1 application topically 2 (two) times daily. (Patient not taking: Reported on 11/01/2019)   No facility-administered medications prior to visit.    Review of Systems  Constitutional: Negative.   Respiratory: Negative.   Cardiovascular: Negative.   Gastrointestinal: Negative for abdominal pain, constipation and diarrhea.  Genitourinary: Negative.       Objective    BP 138/88 (BP Location: Left Arm, Patient Position: Sitting, Cuff Size: Normal)   Pulse 67   Temp (!) 96.8 F (36 C) (Temporal)   Wt 169 lb 14.4 oz (77.1 kg)   SpO2 97%   BMI 26.61 kg/m    Physical Exam Constitutional:       Appearance: Normal appearance.  Cardiovascular:     Rate and Rhythm: Normal rate and regular rhythm.     Pulses: Normal pulses.     Heart sounds: Normal heart sounds.  Pulmonary:     Effort: Pulmonary effort is normal.     Breath sounds: Normal breath sounds.  Abdominal:     General: Abdomen is flat. Bowel sounds are normal.     Palpations: Abdomen is soft.  Skin:    General: Skin is warm and dry.  Neurological:     General: No focal deficit present.     Mental Status: She is alert and oriented to person, place, and time.  Psychiatric:        Mood and Affect: Mood normal.        Behavior: Behavior normal.       No results found for any visits on 12/06/19.  Assessment & Plan    1. Lactose intolerance Patient reported abdominal bloating after eating a lot of different dips at her retirement party on Tuesday,12/04/2019. Suspect this is due to dairy intolerance. Advised to observe for several days. If not improving, will re-evaluate. Can try lactaid pills for symptomatic.    Return if symptoms worsen or fail to improve.      ITrinna Post, PA-C, have reviewed all documentation for this visit. The documentation on 12/06/19 for the exam, diagnosis, procedures, and orders are all accurate and complete.    Wendee Beavers  Neita Garnet  [facility] [phone] (phone) [phone] (fax)  [place]

## 2019-12-17 ENCOUNTER — Ambulatory Visit
Admission: RE | Admit: 2019-12-17 | Discharge: 2019-12-17 | Disposition: A | Discharge status: Home / Self Care | Payer: BC Managed Care – PPO | Source: Ambulatory Visit | Attending: Certified Nurse Midwife | Admitting: Certified Nurse Midwife

## 2019-12-17 DIAGNOSIS — Z1231 Encounter for screening mammogram for malignant neoplasm of breast: Secondary | ICD-10-CM | POA: Insufficient documentation

## 2019-12-17 DIAGNOSIS — Z1239 Encounter for other screening for malignant neoplasm of breast: Secondary | ICD-10-CM

## 2019-12-17 IMAGING — MG DIGITAL SCREENING BILAT W/ TOMO W/ CAD
8 series · 8 of 24 positions shown · non-contrast
Comparison: Previous exam(s).

CLINICAL DATA: Screening.

EXAM:
DIGITAL SCREENING BILATERAL MAMMOGRAM WITH TOMO AND CAD

[L MLO synth-2D]
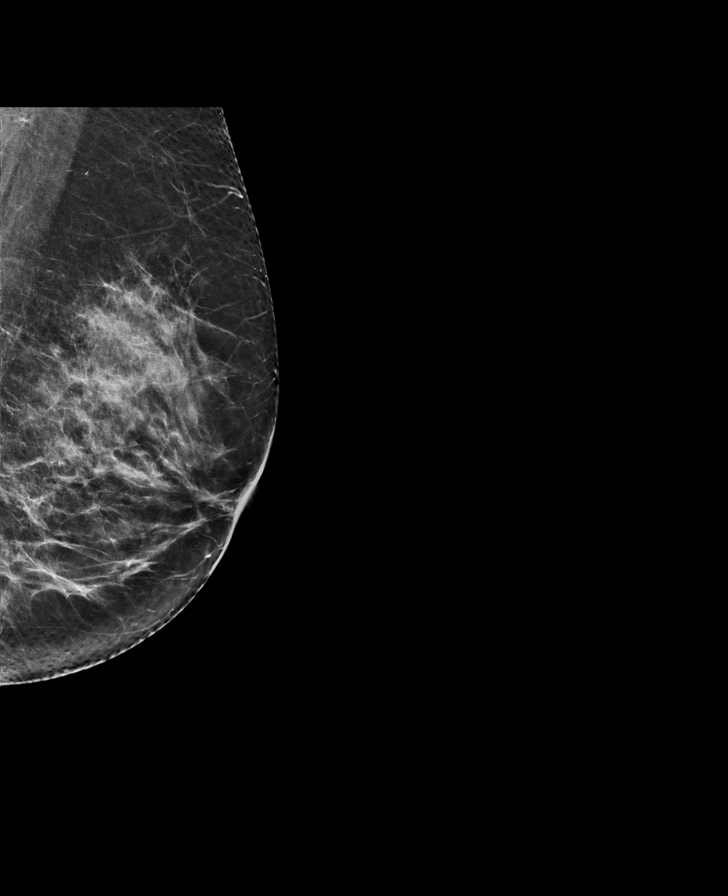

[L CC synth-2D]
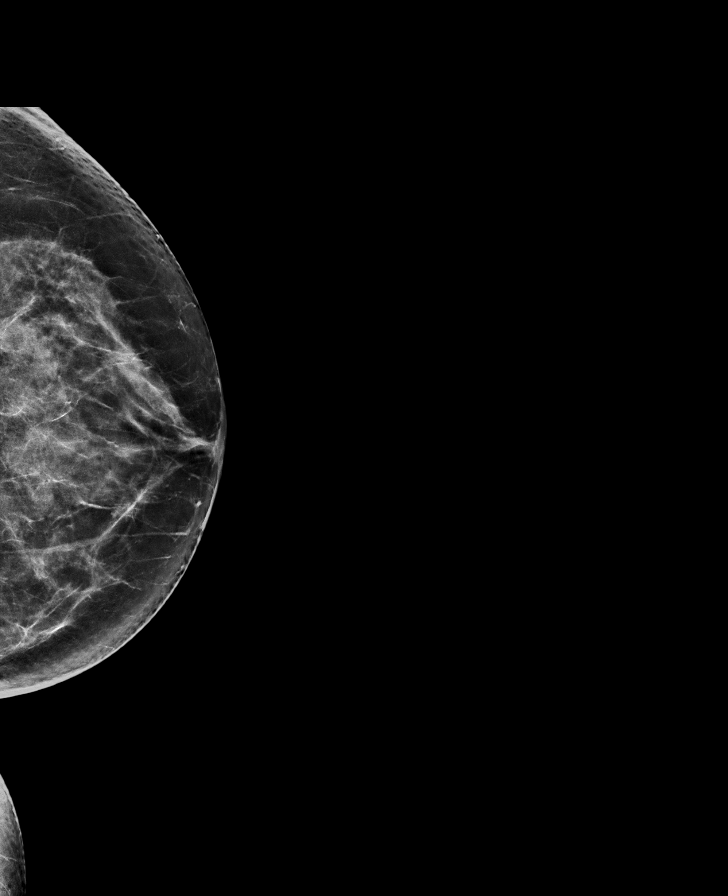

[R CC synth-2D]
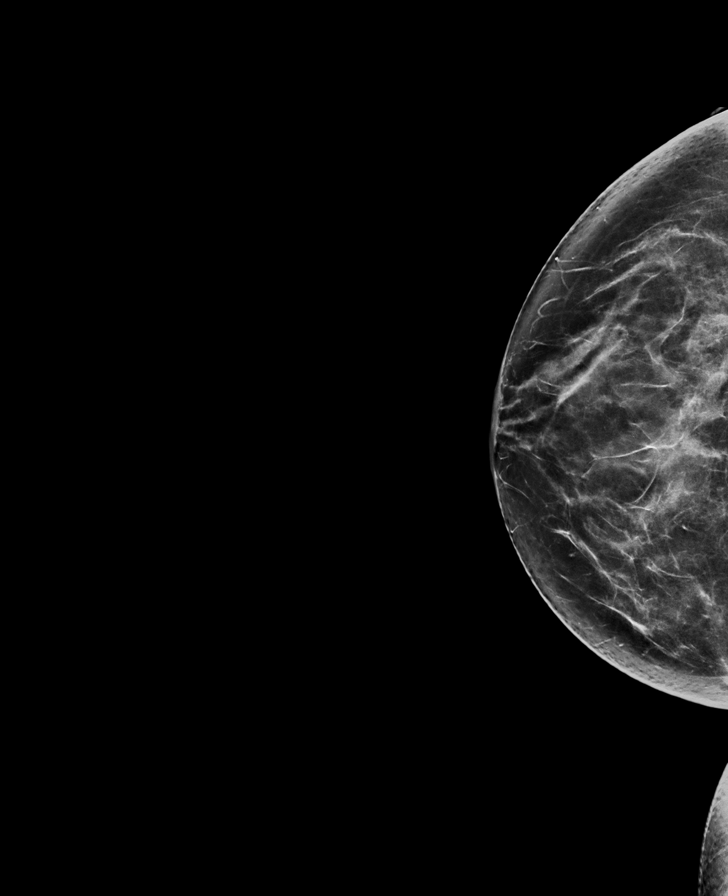

[R MLO synth-2D]
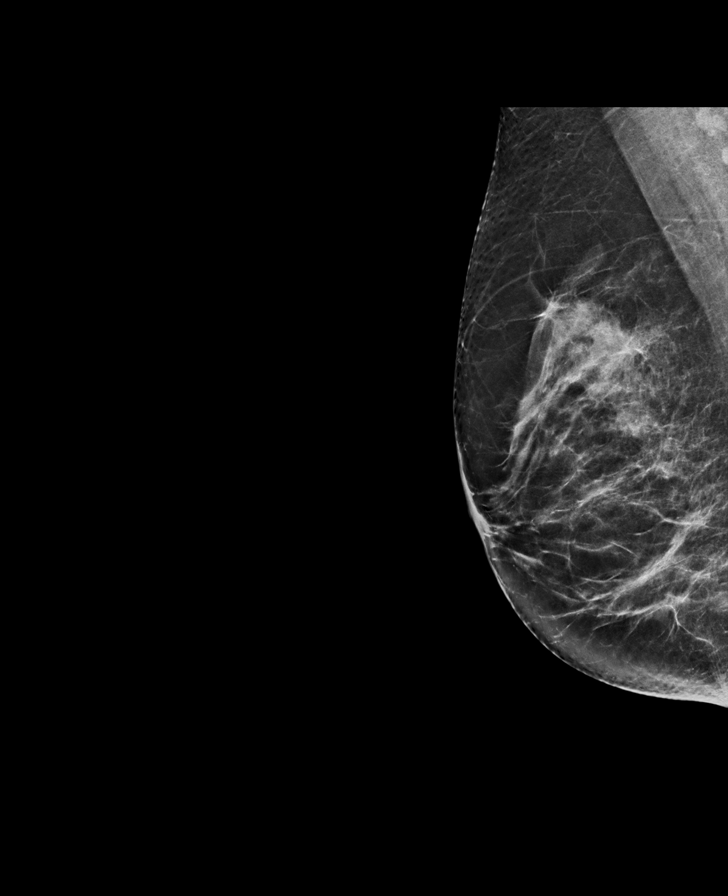

[L MLO tomo · tomo slice 35/69.0]
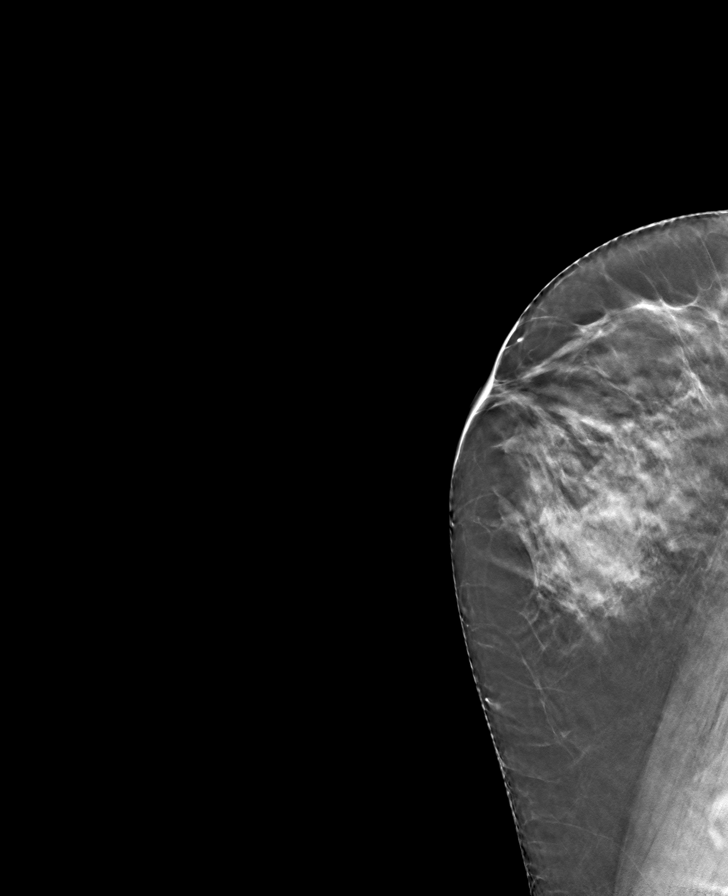

[R CC tomo · tomo slice 39/77.0]
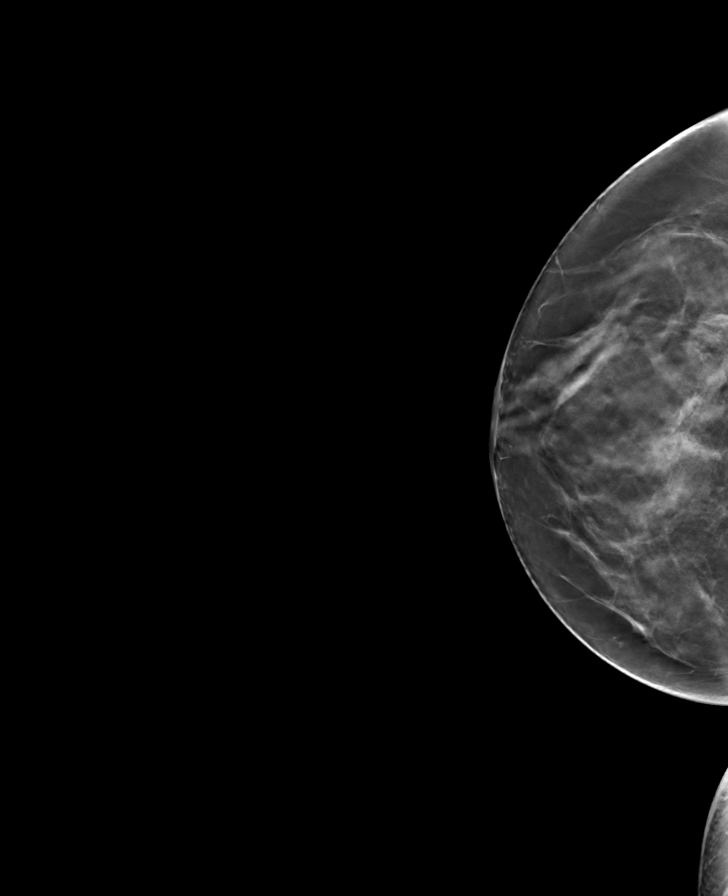

[L CC tomo · tomo slice 37/72.0]
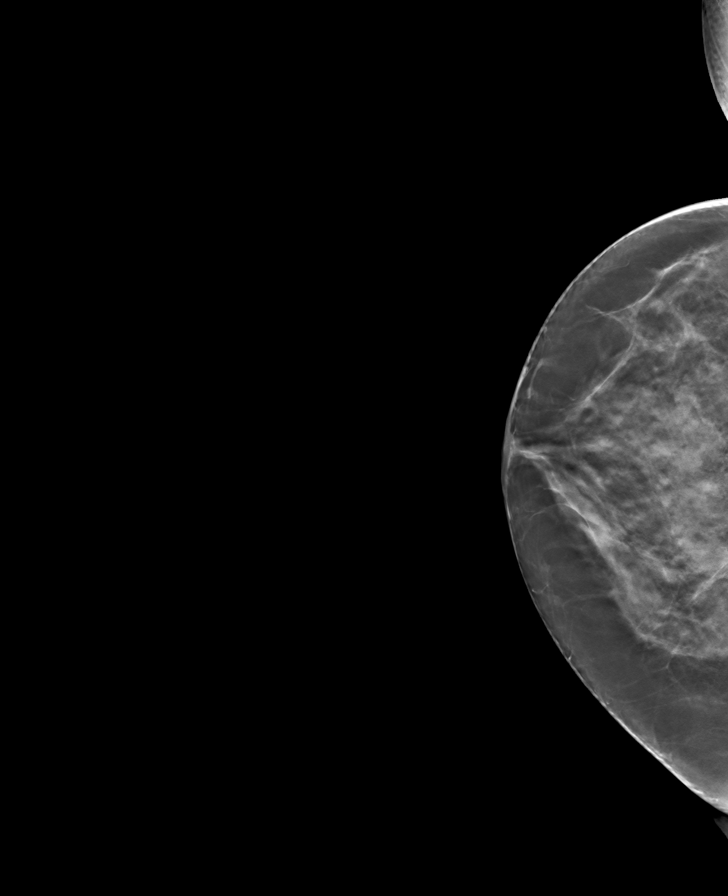

[R MLO tomo · tomo slice 37/74.0]
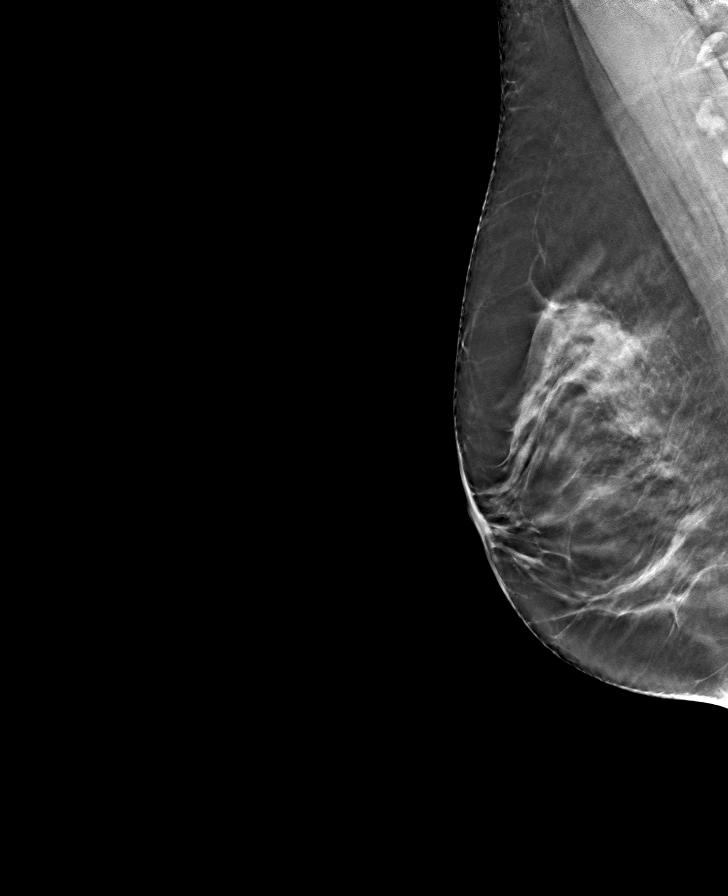

[8 of 24 positions shown; findings below may reference images not displayed]

ACR Breast Density Category c: The breast tissue is heterogeneously
dense, which may obscure small masses.
FINDINGS: There are no findings suspicious for malignancy. Images were
processed with CAD.
IMPRESSION: No mammographic evidence of malignancy. A result letter of this
screening mammogram will be mailed directly to the patient.

RECOMMENDATION:
Screening mammogram in one year. (Code:[5V])

BI-RADS CATEGORY  1: Negative.

## 2020-02-06 ENCOUNTER — Ambulatory Visit: Payer: BC Managed Care – PPO | Admitting: Family Medicine

## 2020-02-06 ENCOUNTER — Encounter: Payer: Self-pay | Admitting: Family Medicine

## 2020-02-06 ENCOUNTER — Other Ambulatory Visit: Payer: Self-pay | Admitting: Family Medicine

## 2020-02-06 ENCOUNTER — Other Ambulatory Visit: Payer: Self-pay

## 2020-02-06 VITALS — BP 120/78 | HR 56 | Temp 98.1°F | Resp 16 | Ht 67.0 in | Wt 154.0 lb

## 2020-02-06 DIAGNOSIS — R160 Hepatomegaly, not elsewhere classified: Secondary | ICD-10-CM | POA: Diagnosis not present

## 2020-02-06 DIAGNOSIS — R822 Biliuria: Secondary | ICD-10-CM

## 2020-02-06 DIAGNOSIS — R17 Unspecified jaundice: Secondary | ICD-10-CM | POA: Diagnosis not present

## 2020-02-06 DIAGNOSIS — R3989 Other symptoms and signs involving the genitourinary system: Secondary | ICD-10-CM

## 2020-02-06 LAB — POCT URINALYSIS DIPSTICK
Bilirubin, UA: POSITIVE
Blood, UA: NEGATIVE
Glucose, UA: NEGATIVE
Ketones, UA: NEGATIVE
Leukocytes, UA: NEGATIVE
Nitrite, UA: NEGATIVE
Protein, UA: NEGATIVE
Spec Grav, UA: 1.01 (ref 1.010–1.025)
Urobilinogen, UA: 0.2 E.U./dL
pH, UA: 6 (ref 5.0–8.0)

## 2020-02-06 NOTE — Progress Notes (Signed)
Established patient visit   Patient: Cassandra [name]   DOB: Nov 10, 1955   64 y.o. Female  MRN: [id number] Visit Date: 02/06/2020  Today's healthcare provider: Lavon Paganini, MD   Chief Complaint  Patient presents with  . dark urine   Subjective    HPI  Cassandra [name] is a 64 year old female who presents for concerns about dehydration and dark urine. The last weekend in July she noticed she was super gassy and her stools were floating. These stools are not diarrhea, but she has been having one additional stool per day. The gassiness has since resolved, and she is having no gas pain or pain in general.   For the last 1.5 weeks her urine has been dark yellow, especially in the morning, and lightens to a lemon color be the evening. She is not having any urinary urgency, frequency, or pain with urination, and the urine is not red/pink or cloudy.  She feels like she is not hydrated and is thirsty a lot. She has noticed that her skin stands up when she puts it together and her eyes feel dry.   Daily she is drinking: 40 oz water, 2-3 cups of coffee, 1 Good Belly probiotic juice, sometimes 1 diet fresca, 1 mixed drink at night, and sleepy time tea at night. She does not consume soda. In the last few weeks she is urinating 4-5 times/day and 1/night.  Cassandra [name] does note that she tends to be a "worry wort" and has this all my life. She notes that she internalizes things and that stress seems to comes out intestinal. It is believed that she has had issues wth lactose intolerance, which was suspected at her last visit to [facility].  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 She is now working as a Psychologist, sport and exercise, having just finished sweet corn season. She eats a lot of corn and vegetables.  She also has a rash on the back of her leg that has returned. She is using clotrimazole cream for this. All other medications are  listed below.  Past Medical History:  Diagnosis Date  . Colon polyps   . Osteopenia after menopause 05/2017   femoral neck T score -2.0   Past Surgical History:  Procedure Laterality Date  . COLONOSCOPY  07/2015   WNL  . COLONOSCOPY  2008/2011  . OVARIAN CYST REMOVAL  1992   dermoid-Dr Burton  . TUBAL LIGATION  1993   Family History  Problem Relation Age of Onset  . Breast cancer Paternal Aunt 73  . Diabetes Mother   . Osteoporosis Mother   . Hyperlipidemia Father   . Rheumatic fever Father   . Valvular heart disease Father   . Colon cancer Paternal Grandmother   . Breast cancer Sister 77       Medications: Outpatient Medications Prior to Visit  Medication Sig  . calcium-vitamin D (OSCAL WITH D) 500-200 MG-UNIT TABS tablet Take by mouth.  . clobetasol cream (TEMOVATE) 0.05 % SMARTSIG:1 Topical Every Night  . Melatonin 3 MG TABS Take by mouth at bedtime as needed.  . Multiple Vitamins-Minerals (MULTIVITAMIN ADULT PO) Take by mouth.  . [DISCONTINUED] triamcinolone cream (KENALOG) 0.1 % Apply 1 application topically 2 (two) times daily. (Patient not taking: Reported on 11/01/2019)   No facility-administered medications prior to visit.   Patient has not been using melatonin or the triamcinolone  creams recently.  Due to concerns of medications, she recently stopped a new multivitamin and her calcium-vitamin D.  She is using a clotrimazole cream for a rash on her leg.  Review of Systems  Constitutional: Negative for activity change, chills, fatigue and fever.       Weight loss  HENT:       Dry mouth and lips  Eyes:       Dry eyes  Respiratory: Negative for chest tightness and shortness of breath.   Cardiovascular: Negative for chest pain and leg swelling.  Gastrointestinal: Negative for abdominal distention, abdominal pain, constipation and diarrhea.  Endocrine: Negative for polydipsia.       Increased thirst  Genitourinary: Negative for dysuria, flank pain, frequency,  hematuria and urgency.       Darker urine  Musculoskeletal: Negative for arthralgias and joint swelling.  Skin: Positive for rash. Negative for color change.       Dry skin; sticking up when squeezed  Neurological: Negative for dizziness and headaches.  Psychiatric/Behavioral: The patient is nervous/anxious.     Last CBC Lab Results  Component Value Date   WBC 5.5 11/12/2019   HGB 14.9 11/12/2019   HCT 42.6 11/12/2019   MCV 96 11/12/2019   MCH 33.4 (H) 11/12/2019   RDW 12.2 11/12/2019   PLT 225 24/40/1027   Last metabolic panel Lab Results  Component Value Date   GLUCOSE 105 (H) 11/12/2019   NA 141 11/12/2019   K 4.0 11/12/2019   CL 104 11/12/2019   CO2 22 11/12/2019   BUN 14 11/12/2019   CREATININE 0.88 11/12/2019   GFRNONAA 70 11/12/2019   GFRAA 80 11/12/2019   CALCIUM 9.5 11/12/2019   PROT 6.6 11/12/2019   ALBUMIN 4.6 11/12/2019   LABGLOB 2.0 11/12/2019   AGRATIO 2.3 (H) 11/12/2019   BILITOT 0.6 11/12/2019   ALKPHOS 100 11/12/2019   AST 14 11/12/2019   ALT 18 11/12/2019      Objective    BP 120/78 (BP Location: Left Arm, Patient Position: Sitting, Cuff Size: Normal)   Pulse (!) 56   Temp 98.1 F (36.7 C) (Oral)   Resp 16   Ht 5\' 7"  (1.702 m)   Wt 154 lb (69.9 kg)   BMI 24.12 kg/m    Physical Exam Vitals reviewed.  Constitutional:      General: She is not in acute distress.    Appearance: She is normal weight. She is not diaphoretic.  HENT:     Head: Normocephalic and atraumatic. No raccoon eyes, right periorbital erythema or left periorbital erythema.     Right Ear: External ear normal.     Left Ear: External ear normal.     Nose: Nose normal.     Mouth/Throat:     Mouth: Mucous membranes are dry.     Comments: Jaundice on pallet and below tongue; dry mucous membranes and lips Eyes:     General: Scleral icterus present.  Cardiovascular:     Rate and Rhythm: Normal rate and regular rhythm.     Heart sounds: Normal heart sounds. No murmur  heard.   Pulmonary:     Effort: Pulmonary effort is normal. No respiratory distress.     Breath sounds: Normal breath sounds. No stridor.  Abdominal:     General: Abdomen is flat.     Palpations: Abdomen is soft. There is hepatomegaly. There is no splenomegaly.     Tenderness: There is no abdominal tenderness. There is no guarding.  Musculoskeletal:  Cervical back: Normal range of motion and neck supple.  Lymphadenopathy:     Cervical: No cervical adenopathy.  Skin:    General: Skin is warm and dry.     Coloration: Skin is jaundiced. Skin is not pale.     Findings: Rash present.     Comments: Tan skin; jaundice on chest and abdomen  Neurological:     General: No focal deficit present.     Mental Status: She is alert. Mental status is at baseline.     Gait: Gait normal.  Psychiatric:        Mood and Affect: Mood normal.        Behavior: Behavior normal. Behavior is cooperative.        Thought Content: Thought content normal.        Judgment: Judgment normal.     Urine dipstick shows positive bilirubin negative for all other components.  Results for orders placed or performed in visit on 02/06/20  POCT urinalysis dipstick  Result Value Ref Range   Color, UA yellow    Clarity, UA clear    Glucose, UA Negative Negative   Bilirubin, UA Positive    Ketones, UA Negative    Spec Grav, UA 1.010 1.010 - 1.025   Blood, UA Negative    pH, UA 6.0 5.0 - 8.0   Protein, UA Negative Negative   Urobilinogen, UA 0.2 0.2 or 1.0 E.U./dL   Nitrite, UA Negative    Leukocytes, UA Negative Negative   Appearance dark     Assessment & Plan    This constellation of signs and symptoms is concerning for a pancreatic, gallbladder, bile duct, or liver etiology. She has been losing weight, which, while intentional, is concerning. Possible pancreatic origins includes pancreatic adenocarcinoma and pancreatitis, leading to obstruction of bile flow. Possible gallbladder/bile duct origins include  choledocholithiasis (less likely given lack of pain), cholangiocarcinoma, and primary sclerosing cholangitis. Possible hepatic etiologies include hepatitis, especially viral, primary biliary cholangitis, congenital disorders such as Dubin-Johnson syndrome (less likely as this patient is first presenting at 64 years old), and Wilson disease.  Also concerned by her weight loss, though she reports this was intentional.  1. Dark yellow-colored urine  2. Bilirubin in urine  3. Jaundice  4. Hepatomegaly  Plan: - US Abdomen Complete; Future - Comprehensive metabolic panel - CBC w/Diff/Platelet - Hepatitis, Acute - Lipase - Bilirubin, fractionated (tot/dir/indir) - Ambulatory referral to Gastroenterology - INR/PT - HIV antibody (with reflex)  Return if symptoms worsen or fail to improve.     Rodrigo Ran, MS3

## 2020-02-06 NOTE — Patient Instructions (Signed)
Jaundice, Adult  Jaundice is when the skin, the whites of the eyes, and the lining of the mouth and nose (mucous membranes) turn a yellowish color. It is caused by having too much bilirubin in the blood. Bilirubin is made by the normal breakdown of red blood cells. Having jaundice means that your body's bile system may not be working as it should. The bile system is made up of the liver, the gallbladder, and the bile ducts. They work together to make, store, and move bile. Jaundice may be caused by drinking too much alcohol. It may also be caused by liver disease, infections, cancers, or some medicines. Your doctor may treat you with medicine, fluids, or surgery. Follow these instructions at home:   Take over-the-counter and prescription medicines only as told by your doctor.  You can use skin lotion to help with itching.  Drink plenty of fluids.  Do not drink alcohol.  Keep all follow-up visits as told by your doctor. This is important. Contact a doctor if:  You have a fever.  You have swelling or pain in your belly (abdomen). Get help right away if:  Your symptoms get worse all of a sudden.  Your pain gets worse.  You keep throwing up (vomiting).  You throw up blood.  You become weak or confused.  You get a very bad headache.  You have blood in your poop.  You lose too much body fluid (dehydration). Signs that have you lost too much body fluid include: ? A very dry mouth. ? A fast, weak pulse. ? Fast breathing. ? Blue lips. Summary  Jaundice is when the skin, the whites of the eyes, and the lining of the mouth and nose turn a yellowish color.  Jaundice may be caused by a problem in the liver, the gallbladder, and the bile ducts.  Follow your doctor's instructions for home care. These include drinking plenty of fluid, not drinking alcohol, and taking medicines as told.  Get help right away if your symptoms get worse, you feel weak or confused, you throw up, you  have blood in your poop, you have a very bad headache, or you lose too much body fluid. This information is not intended to replace advice given to you by your health care provider. Make sure you discuss any questions you have with your health care provider. Document Revised: 06/17/2017 Document Reviewed: 06/17/2017 Elsevier Patient Education  2020 [employer].

## 2020-02-07 ENCOUNTER — Telehealth: Payer: Self-pay | Admitting: Adult Health

## 2020-02-07 ENCOUNTER — Telehealth: Payer: Self-pay

## 2020-02-07 ENCOUNTER — Other Ambulatory Visit: Payer: Self-pay | Admitting: Family Medicine

## 2020-02-07 DIAGNOSIS — R140 Abdominal distension (gaseous): Secondary | ICD-10-CM

## 2020-02-07 DIAGNOSIS — R17 Unspecified jaundice: Secondary | ICD-10-CM

## 2020-02-07 DIAGNOSIS — R160 Hepatomegaly, not elsewhere classified: Secondary | ICD-10-CM

## 2020-02-07 LAB — CBC WITH DIFFERENTIAL/PLATELET
Basophils Absolute: 0 10*3/uL (ref 0.0–0.2)
Basos: 1 %
EOS (ABSOLUTE): 0.1 10*3/uL (ref 0.0–0.4)
Eos: 1 %
Hematocrit: 41.7 % (ref 34.0–46.6)
Hemoglobin: 13.9 g/dL (ref 11.1–15.9)
Immature Grans (Abs): 0 10*3/uL (ref 0.0–0.1)
Immature Granulocytes: 0 %
Lymphocytes Absolute: 1.4 10*3/uL (ref 0.7–3.1)
Lymphs: 25 %
MCH: 31.3 pg (ref 26.6–33.0)
MCHC: 33.3 g/dL (ref 31.5–35.7)
MCV: 94 fL (ref 79–97)
Monocytes Absolute: 0.6 10*3/uL (ref 0.1–0.9)
Monocytes: 10 %
Neutrophils Absolute: 3.4 10*3/uL (ref 1.4–7.0)
Neutrophils: 63 %
Platelets: 221 10*3/uL (ref 150–450)
RBC: 4.44 x10E6/uL (ref 3.77–5.28)
RDW: 12.6 % (ref 11.7–15.4)
WBC: 5.5 10*3/uL (ref 3.4–10.8)

## 2020-02-07 LAB — COMPREHENSIVE METABOLIC PANEL
ALT: 366 IU/L — ABNORMAL HIGH (ref 0–32)
AST: 195 IU/L — ABNORMAL HIGH (ref 0–40)
Albumin/Globulin Ratio: 2.3 — ABNORMAL HIGH (ref 1.2–2.2)
Albumin: 4.9 g/dL — ABNORMAL HIGH (ref 3.8–4.8)
Alkaline Phosphatase: 934 IU/L — ABNORMAL HIGH (ref 48–121)
BUN/Creatinine Ratio: 14 (ref 12–28)
BUN: 11 mg/dL (ref 8–27)
Bilirubin Total: 9.9 mg/dL — ABNORMAL HIGH (ref 0.0–1.2)
CO2: 23 mmol/L (ref 20–29)
Calcium: 10.2 mg/dL (ref 8.7–10.3)
Chloride: 99 mmol/L (ref 96–106)
Creatinine, Ser: 0.8 mg/dL (ref 0.57–1.00)
GFR calc Af Amer: 90 mL/min/{1.73_m2} (ref >59)
GFR calc non Af Amer: 78 mL/min/{1.73_m2} (ref >59)
Globulin, Total: 2.1 g/dL (ref 1.5–4.5)
Glucose: 113 mg/dL — ABNORMAL HIGH (ref 65–99)
Potassium: 4.1 mmol/L (ref 3.5–5.2)
Sodium: 137 mmol/L (ref 134–144)
Total Protein: 7 g/dL (ref 6.0–8.5)

## 2020-02-07 LAB — HIV ANTIBODY (ROUTINE TESTING W REFLEX): HIV Screen 4th Generation wRfx: NONREACTIVE

## 2020-02-07 LAB — LIPASE: Lipase: 140 U/L — ABNORMAL HIGH (ref 14–72)

## 2020-02-07 LAB — HEPATITIS PANEL, ACUTE
Hep A IgM: NEGATIVE
Hep B C IgM: NEGATIVE
Hep C Virus Ab: 0.1 s/co ratio (ref 0.0–0.9)
Hepatitis B Surface Ag: NEGATIVE

## 2020-02-07 LAB — BILIRUBIN, FRACTIONATED(TOT/DIR/INDIR)
Bilirubin, Direct: 8.11 mg/dL — ABNORMAL HIGH (ref 0.00–0.40)
Bilirubin, Indirect: 1.79 mg/dL — ABNORMAL HIGH (ref 0.10–0.80)

## 2020-02-07 LAB — PROTIME-INR
INR: 1 (ref 0.9–1.2)
Prothrombin Time: 10.4 s (ref 9.1–12.0)

## 2020-02-07 NOTE — Telephone Encounter (Signed)
Patient advised as below. Patient verbalizes understanding and is in agreement with treatment plan.  

## 2020-02-07 NOTE — Telephone Encounter (Signed)
[place], [place]  with [facility] called to let you know they have just seen the pt.  They have decided to do a MRI.  Per Sharyn Lull, pt will NOT need the [place] or the CT scan.  If you have any questions, you can call her at  [phone]  Her nurse is Museum/gallery conservator.  Please fell free to cb if you need.

## 2020-02-07 NOTE — Telephone Encounter (Signed)
-----   Message from [name], MD sent at 02/07/2020 10:57 AM EDT ----- Labs show that liver and pancreas are inflammed.  The bilirubin is high as expected.  I want to proceed with CT scan of the abdomen that we will try to get today.  Please call [facility] to get this set up.  We also need to make sure GI is seeing her urgently per referral yesterday.  This will let us see the liver and pancreas more clearly.  If we cannot get this done today, may need to consider sending her to the hospital

## 2020-02-08 NOTE — Telephone Encounter (Signed)
Contact [facility] scheduling department amd canceled [place] that was scheduled

## 2020-02-08 NOTE — Telephone Encounter (Signed)
Glad she was able to see GI and is getting imaging.  We can cancel [place] and CT.  Thanks!

## 2020-02-13 ENCOUNTER — Inpatient Hospital Stay: Admission: RE | Admit: 2020-02-13 | Payer: BC Managed Care – PPO | Source: Ambulatory Visit

## 2020-02-28 ENCOUNTER — Encounter: Payer: Self-pay | Admitting: Oncology

## 2020-02-28 NOTE — Progress Notes (Signed)
Prescreened new patient for visit. Chart reviewed and updated. Pt reports she had itching and decreased appetite but both problems are getting better. No other concerns voiced. She was seen by [facility] oncologist but prefers to have treatment locally.

## 2020-02-29 ENCOUNTER — Inpatient Hospital Stay: Payer: BC Managed Care – PPO | Attending: Oncology | Admitting: Oncology

## 2020-02-29 ENCOUNTER — Inpatient Hospital Stay: Payer: BC Managed Care – PPO

## 2020-02-29 ENCOUNTER — Other Ambulatory Visit: Payer: Self-pay

## 2020-02-29 VITALS — BP 132/84 | HR 59 | Temp 98.9°F | Resp 18 | Ht 67.0 in | Wt 144.0 lb

## 2020-02-29 DIAGNOSIS — Z87891 Personal history of nicotine dependence: Secondary | ICD-10-CM

## 2020-02-29 DIAGNOSIS — R509 Fever, unspecified: Secondary | ICD-10-CM | POA: Insufficient documentation

## 2020-02-29 DIAGNOSIS — Z5111 Encounter for antineoplastic chemotherapy: Secondary | ICD-10-CM | POA: Insufficient documentation

## 2020-02-29 DIAGNOSIS — C250 Malignant neoplasm of head of pancreas: Secondary | ICD-10-CM | POA: Diagnosis not present

## 2020-02-29 DIAGNOSIS — R634 Abnormal weight loss: Secondary | ICD-10-CM | POA: Insufficient documentation

## 2020-02-29 DIAGNOSIS — R109 Unspecified abdominal pain: Secondary | ICD-10-CM | POA: Diagnosis not present

## 2020-02-29 DIAGNOSIS — E871 Hypo-osmolality and hyponatremia: Secondary | ICD-10-CM | POA: Insufficient documentation

## 2020-02-29 DIAGNOSIS — R140 Abdominal distension (gaseous): Secondary | ICD-10-CM | POA: Diagnosis not present

## 2020-02-29 DIAGNOSIS — G459 Transient cerebral ischemic attack, unspecified: Secondary | ICD-10-CM | POA: Diagnosis not present

## 2020-02-29 DIAGNOSIS — C787 Secondary malignant neoplasm of liver and intrahepatic bile duct: Secondary | ICD-10-CM | POA: Diagnosis not present

## 2020-02-29 DIAGNOSIS — Z7189 Other specified counseling: Secondary | ICD-10-CM | POA: Insufficient documentation

## 2020-02-29 DIAGNOSIS — C259 Malignant neoplasm of pancreas, unspecified: Secondary | ICD-10-CM

## 2020-02-29 DIAGNOSIS — R739 Hyperglycemia, unspecified: Secondary | ICD-10-CM | POA: Insufficient documentation

## 2020-02-29 DIAGNOSIS — N2889 Other specified disorders of kidney and ureter: Secondary | ICD-10-CM | POA: Diagnosis not present

## 2020-02-29 DIAGNOSIS — Z803 Family history of malignant neoplasm of breast: Secondary | ICD-10-CM | POA: Diagnosis not present

## 2020-02-29 DIAGNOSIS — Z800 Family history of malignant neoplasm of digestive organs: Secondary | ICD-10-CM | POA: Insufficient documentation

## 2020-02-29 DIAGNOSIS — Z809 Family history of malignant neoplasm, unspecified: Secondary | ICD-10-CM

## 2020-02-29 DIAGNOSIS — K831 Obstruction of bile duct: Secondary | ICD-10-CM | POA: Insufficient documentation

## 2020-02-29 LAB — CBC WITH DIFFERENTIAL/PLATELET
Abs Immature Granulocytes: 0.03 10*3/uL (ref 0.00–0.07)
Basophils Absolute: 0.1 10*3/uL (ref 0.0–0.1)
Basophils Relative: 1 %
Eosinophils Absolute: 0.1 10*3/uL (ref 0.0–0.5)
Eosinophils Relative: 1 %
HCT: 37.2 % (ref 36.0–46.0)
Hemoglobin: 13.4 g/dL (ref 12.0–15.0)
Immature Granulocytes: 1 %
Lymphocytes Relative: 24 %
Lymphs Abs: 1.5 10*3/uL (ref 0.7–4.0)
MCH: 33 pg (ref 26.0–34.0)
MCHC: 36 g/dL (ref 30.0–36.0)
MCV: 91.6 fL (ref 80.0–100.0)
Monocytes Absolute: 0.6 10*3/uL (ref 0.1–1.0)
Monocytes Relative: 9 %
Neutro Abs: 4.1 10*3/uL (ref 1.7–7.7)
Neutrophils Relative %: 64 %
Platelets: 280 10*3/uL (ref 150–400)
RBC: 4.06 MIL/uL (ref 3.87–5.11)
RDW: 12.8 % (ref 11.5–15.5)
WBC: 6.3 10*3/uL (ref 4.0–10.5)
nRBC: 0 % (ref 0.0–0.2)

## 2020-02-29 LAB — COMPREHENSIVE METABOLIC PANEL
ALT: 283 U/L — ABNORMAL HIGH (ref 0–44)
AST: 170 U/L — ABNORMAL HIGH (ref 15–41)
Albumin: 3.8 g/dL (ref 3.5–5.0)
Alkaline Phosphatase: 374 U/L — ABNORMAL HIGH (ref 38–126)
Anion gap: 9 (ref 5–15)
BUN: 12 mg/dL (ref 8–23)
CO2: 26 mmol/L (ref 22–32)
Calcium: 9.1 mg/dL (ref 8.9–10.3)
Chloride: 103 mmol/L (ref 98–111)
Creatinine, Ser: 0.86 mg/dL (ref 0.44–1.00)
GFR calc Af Amer: >60 mL/min (ref >60)
GFR calc non Af Amer: >60 mL/min (ref >60)
Glucose, Bld: 109 mg/dL — ABNORMAL HIGH (ref 70–99)
Potassium: 4.1 mmol/L (ref 3.5–5.1)
Sodium: 138 mmol/L (ref 135–145)
Total Bilirubin: 4.4 mg/dL — ABNORMAL HIGH (ref 0.3–1.2)
Total Protein: 7.1 g/dL (ref 6.5–8.1)

## 2020-02-29 MED ORDER — PROCHLORPERAZINE MALEATE 10 MG PO TABS: 10.0000 mg | ORAL_TABLET | Freq: Four times a day (QID) | ORAL | 1 refills | Status: DC | PRN

## 2020-02-29 MED ORDER — LIDOCAINE-PRILOCAINE 2.5-2.5 % EX CREA: TOPICAL_CREAM | CUTANEOUS | 3 refills | Status: DC

## 2020-02-29 MED ORDER — LOPERAMIDE HCL 2 MG PO TABS: ORAL_TABLET | ORAL | 1 refills | Status: DC

## 2020-02-29 MED ORDER — ONDANSETRON HCL 8 MG PO TABS: 8.0000 mg | ORAL_TABLET | Freq: Two times a day (BID) | ORAL | 1 refills | Status: DC | PRN

## 2020-02-29 NOTE — Progress Notes (Signed)
Hematology/Oncology Consult note [facility] Telephone:([phone] [phone]   Patient Care Team: Flinchum, Kelby Aline, FNP as PCP - General (Family Medicine) Flinchum, Kelby Aline, FNP (Family Medicine) [name], RN as Oncology Nurse Navigator  REFERRING PROVIDER: Sharmon Leyden*  CHIEF COMPLAINTS/REASON FOR VISIT:  Evaluation of pancreatic adenocarcinoma  HISTORY OF PRESENTING ILLNESS:   Cassandra [name] is a  64 y.o.  female with PMH listed below was seen in consultation at the request of  Flinchum, Kelby Aline, F*  for evaluation of pancreatic adenocarcinoma Patient initially presented with jaundice, transaminitis, bilirubin was 9.9.  CA 19-9 was 1874.  Patient also reports unintentional weight loss. 02/08/2020 MRI abdomen and MRCP with and without contrast was done at [facility] which showed pancreatic head mass measuring up to 3 cm, with marked associated narrowing of the portal vein confluence.  SMA is preserved.  Marked intrahepatic and extrahepatic biliary duct dilatation as well as mild dilatation of the main pancreatic duct. Multiple hepatic masses highly concerning for metastatic disease.  Patient underwent EUS on 02/19/2020, which showed irregular mass identified in the pancreatic head, hypoechoic, measured 57mmx33mm, sonographic evidence concerning for invasion into the superior mesenteric artery.  There is no sign of significant abnormality in the main pancreatic duct.  Dilatation of common bile duct which measured up to 16 mm.  Region of celiac artery was visualized and showed no signs of significant abnormality.  No lymphadenopathy.  FNA showed adenocarcinoma.  02/19/2020, ERCP, malignant.  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  The stricture was treated with placement of wall flex metal stent.  Patient was seen by [facility] oncology Dr. Pia Mau and was recommended for 3 drug  regimen FOLFIRINOX.  Patient prefers to do chemotherapy locally at [facility]  Patient was referred to establish care today. She denies any pain.  Since stent placement, skin jaundice has improved.  Itchiness has also improved. Patient was accompanied by her husband today.  She has a family history of breast cancer in sister and paternal aunt, colon cancer paternal grandmother.   Review of Systems  Constitutional: Positive for unexpected weight change. Negative for chills, fatigue and fever.  HENT:   Negative for hearing loss and voice change.   Eyes: Negative for eye problems.  Respiratory: Negative for chest tightness and cough.   Cardiovascular: Negative for chest pain.  Gastrointestinal: Negative for abdominal distention, abdominal pain and blood in stool.  Endocrine: Negative for hot flashes.  Genitourinary: Negative for difficulty urinating and frequency.   Musculoskeletal: Negative for arthralgias.  Skin: Positive for itching. Negative for rash.       Jaundice  Neurological: Negative for extremity weakness.  Hematological: Negative for adenopathy.  Psychiatric/Behavioral: Negative for confusion.    MEDICAL HISTORY:  Past Medical History:  Diagnosis Date  . Colon polyps   . Osteopenia after menopause 05/2017   femoral neck T score -2.0    SURGICAL HISTORY: Past Surgical History:  Procedure Laterality Date  . COLONOSCOPY  07/2015   WNL  . COLONOSCOPY  2008/2011  . OVARIAN CYST REMOVAL  1992   dermoid-Dr Milliken  . TUBAL LIGATION  1993    SOCIAL HISTORY: Social History   Socioeconomic History  . Marital status: Married    Spouse name: Not on file  . Number of children: 2  . Years of education: Not on file  . Highest education level: Not on file  Occupational History  .  Occupation: Pharmacist, hospital    Comment: retired  . Occupation: Farmer  Tobacco Use  . Smoking status: Former Smoker    Packs/day: 0.50    Years: 10.00    Pack years: 5.00    Types: Cigarettes    Quit  date: 1987    Years since quitting: 34.6  . Smokeless tobacco: Never Used  . Tobacco comment: Quit smoking 1987  Vaping Use  . Vaping Use: Never used  Substance and Sexual Activity  . Alcohol use: Yes    Comment: 0-2 mixed drinks a day  . Drug use: No  . Sexual activity: Not Currently    Birth control/protection: Post-menopausal  Other Topics Concern  . Not on file  Social History Narrative  . Not on file   Social Determinants of Health   Financial Resource Strain:   . Difficulty of Paying Living Expenses: Not on file  Food Insecurity:   . Worried About Charity fundraiser in the Last Year: Not on file  . Ran Out of Food in the Last Year: Not on file  Transportation Needs:   . Lack of Transportation (Medical): Not on file  . Lack of Transportation (Non-Medical): Not on file  Physical Activity:   . Days of Exercise per Week: Not on file  . Minutes of Exercise per Session: Not on file  Stress:   . Feeling of Stress : Not on file  Social Connections:   . Frequency of Communication with Friends and Family: Not on file  . Frequency of Social Gatherings with Friends and Family: Not on file  . Attends Religious Services: Not on file  . Active Member of Clubs or Organizations: Not on file  . Attends Archivist Meetings: Not on file  . Marital Status: Not on file  Intimate Partner Violence:   . Fear of Current or Ex-Partner: Not on file  . Emotionally Abused: Not on file  . Physically Abused: Not on file  . Sexually Abused: Not on file    FAMILY HISTORY: Family History  Problem Relation Age of Onset  . Breast cancer Paternal Aunt 65  . Diabetes Mother   . Osteoporosis Mother   . Hyperlipidemia Father   . Rheumatic fever Father   . Valvular heart disease Father   . Colon cancer Paternal Grandmother   . Breast cancer Sister 3    ALLERGIES:  is allergic to penicillin g.  MEDICATIONS:  Current Outpatient Medications  Medication Sig Dispense Refill  .  calcium-vitamin D (OSCAL WITH D) 500-200 MG-UNIT TABS tablet Take by mouth.    . Melatonin 3 MG TABS Take by mouth at bedtime as needed.    . Multiple Vitamins-Minerals (MULTIVITAMIN ADULT PO) Take by mouth.    . cholestyramine (QUESTRAN) 4 g packet Take by mouth. Take 4 g by mouth 2 (two) times daily Mix dose in 60-180 mL of water, milk or juice (Patient not taking: Reported on 02/28/2020)     No current facility-administered medications for this visit.     PHYSICAL EXAMINATION: ECOG PERFORMANCE STATUS: 0 - Asymptomatic Vitals:   02/29/20 1136  BP: 132/84  Pulse: (!) 59  Resp: 18  Temp: 98.9 F (37.2 C)   Filed Weights   02/29/20 1136  Weight: 144 lb (65.3 kg)    Physical Exam Constitutional:      General: She is not in acute distress. HENT:     Head: Normocephalic and atraumatic.  Eyes:     General: No scleral icterus.  Cardiovascular:     Rate and Rhythm: Normal rate and regular rhythm.     Heart sounds: Normal heart sounds.  Pulmonary:     Effort: Pulmonary effort is normal. No respiratory distress.     Breath sounds: No wheezing.  Abdominal:     General: Bowel sounds are normal. There is no distension.     Palpations: Abdomen is soft.  Musculoskeletal:        General: No deformity. Normal range of motion.     Cervical back: Normal range of motion and neck supple.  Skin:    General: Skin is warm and dry.     Coloration: Skin is jaundiced.  Neurological:     Mental Status: She is alert and oriented to person, place, and time. Mental status is at baseline.     Cranial Nerves: No cranial nerve deficit.     Coordination: Coordination normal.  Psychiatric:        Mood and Affect: Mood normal.     LABORATORY DATA:  I have reviewed the data as listed Lab Results  Component Value Date   WBC 6.3 02/29/2020   HGB 13.4 02/29/2020   HCT 37.2 02/29/2020   MCV 91.6 02/29/2020   PLT 280 02/29/2020   Recent Labs    11/12/19 0821 02/06/20 1049 02/29/20 1314  NA  141 137 138  K 4.0 4.1 4.1  CL 104 99 103  CO2 22 23 26   GLUCOSE 105* 113* 109*  BUN 14 11 12   CREATININE 0.88 0.80 0.86  CALCIUM 9.5 10.2 9.1  GFRNONAA 70 78 >60  GFRAA 80 90 >60  PROT 6.6 7.0 7.1  ALBUMIN 4.6 4.9* 3.8  AST 14 195* 170*  ALT 18 366* 283*  ALKPHOS 100 934* 374*  BILITOT 0.6 9.9* 4.4*  BILIDIR  --  8.11*  --   IBILI  --  1.79*  --    Iron/TIBC/Ferritin/ %Sat No results found for: IRON, TIBC, FERRITIN, IRONPCTSAT    RADIOGRAPHIC STUDIES: I have personally reviewed the radiological images as listed and agreed with the findings in the report. Reviewed findings of MRI abdomen MRCP done at [facility].   ASSESSMENT & PLAN:  1. Primary pancreatic cancer  ([place])   2. Family history of cancer   3. Goals of care, counseling/discussion   4. 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 #Image findings were reviewed by me and discussed with patient. Pathology was reviewed and discussed with patient. Obtain CT chest to complete staging.  The diagnosis and care plan were discussed with patient in detail.  NCCN guidelines were reviewed and shared with patient.   The goal of treatment which is to palliate disease, disease related symptoms, improve quality of life and hopefully prolong life was highlighted in our discussion. She understands that her disease is not curable.  Chemotherapy education was provided.  We had discussed the composition of chemotherapy regimen, length of chemo cycle, duration of treatment and the time to assess response to treatment.    I explained to the patient the risks and benefits of chemotherapy FOLFIRINOX including all but not limited to hair loss, mouth sore, nausea, vomiting, diarrhea, hair loss, low blood counts, bleeding,  neuropathy and risk of life threatening infection and even death, secondary malignancy etc.  .  Patient voices understanding and willing to proceed chemotherapy.   # Chemotherapy education; Medi- port placement. Antiemetics-Zofran and Compazine; EMLA cream sent to pharmacy # Refer to genetic consoler for genetic testing.  Will obtain molecular testing.   # obstructive jaundice, s/p stenting.  Hyperbilirubinemia, check liver function today.  Initial irinotecan level may need to be dose adjusted, depending on her bilirubin level.   Supportive care measures are necessary for patient well-being and will be provided as necessary. We spent sufficient time to discuss many aspect of care, questions were answered to patient's satisfaction.   Orders Placed This Encounter  Procedures  . CT CHEST W CONTRAST    Standing Status:   Future    Standing Expiration Date:   02/28/2021    Order Specific Question:   If indicated for the ordered procedure, I authorize the administration of contrast media per Radiology protocol    Answer:   Yes    Order Specific Question:   Preferred imaging location?    Answer:   [facility]    Order Specific Question:   Radiology Contrast Protocol - do NOT remove file path    Answer:   [email or web address]  . CBC with Differential/Platelet    Standing Status:   Future    Number of Occurrences:   1    Standing Expiration Date:   02/28/2021  . Comprehensive metabolic panel    Standing Status:   Future    Number of Occurrences:   1    Standing Expiration Date:   02/28/2021  . Cancer antigen 19-9    Standing Status:   Future    Number of Occurrences:   1    Standing Expiration Date:   02/28/2021  . Ambulatory referral to Vascular Surgery    Referral Priority:   Routine    Referral Type:   Surgical    Referral Reason:   Specialty Services Required    Requested Specialty:   Vascular Surgery    Number of Visits Requested:   1  .  Ambulatory referral to Genetics    Referral Priority:   Routine    Referral Type:   Consultation    Referral Reason:   Specialty Services Required    Number of Visits Requested:   1    All questions were answered. The patient knows to call the clinic with any problems questions or concerns.  cc Flinchum, Kelby Aline, F*    Return of visit: 1 week to start chemotherapy.  Thank you for this kind referral and the opportunity to participate in the care of this patient. A copy of today's note is routed to referring provider  We spent sufficient time to discuss many aspect of care, questions were answered to patient's satisfaction. A total of 80 minutes was spent on this visit.  With 10 minutes spent reviewing image findings, pathology reports, 60 minutes counseling the patient on the diagnosis, goal of care, chemotherapy treatments, side effects of the treatment, management of symptoms, and also chemotherapy orders. Additional 10 minutes was spent on answering patient's questions.    [name], MD, PhD Hematology Oncology [facility] Pager- [phone] 02/29/2020

## 2020-02-29 NOTE — H&P (View-Only) (Signed)
START ON PATHWAY REGIMEN - Pancreatic Adenocarcinoma     A cycle is every 14 days:     Oxaliplatin      Leucovorin      Irinotecan      Fluorouracil   **Always confirm dose/schedule in your pharmacy ordering system**  Patient Characteristics: Metastatic Disease, First Line, PS = 0,1, BRCA1/2 and PALB2  Mutation Absent/Unknown Therapeutic Status: Metastatic Disease Line of Therapy: First Line ECOG Performance Status: 0 BRCA1/2 Mutation Status: Awaiting Test Results PALB2 Mutation Status: Awaiting Test Results Intent of Therapy: Non-Curative / Palliative Intent, Discussed with Patient

## 2020-02-29 NOTE — Progress Notes (Signed)
START ON PATHWAY REGIMEN - Pancreatic Adenocarcinoma     A cycle is every 14 days:     Oxaliplatin      Leucovorin      Irinotecan      Fluorouracil   **Always confirm dose/schedule in your pharmacy ordering system**  Patient Characteristics: Metastatic Disease, First Line, PS = 0,1, BRCA1/2 and PALB2  Mutation Absent/Unknown Therapeutic Status: Metastatic Disease Line of Therapy: First Line ECOG Performance Status: 0 BRCA1/2 Mutation Status: Awaiting Test Results PALB2 Mutation Status: Awaiting Test Results Intent of Therapy: Non-Curative / Palliative Intent, Discussed with Patient

## 2020-03-01 LAB — CANCER ANTIGEN 19-9: CA 19-9: 2433 U/mL — ABNORMAL HIGH (ref 0–35)

## 2020-03-04 ENCOUNTER — Telehealth (INDEPENDENT_AMBULATORY_CARE_PROVIDER_SITE_OTHER): Payer: Self-pay

## 2020-03-04 ENCOUNTER — Telehealth: Payer: Self-pay

## 2020-03-04 ENCOUNTER — Telehealth: Payer: Self-pay | Admitting: *Deleted

## 2020-03-04 DIAGNOSIS — C259 Malignant neoplasm of pancreas, unspecified: Secondary | ICD-10-CM

## 2020-03-04 NOTE — Telephone Encounter (Addendum)
Asked MD if PD-L 1 testing was needed to prceed with foundation one and she said it was not. Per Iota from MD: role of immunotherapy is based on her MMR status. is it possible to ask [facility] to do MMR on her biopsy sample? not a reutine standard tretment unless her pancreatic cancer has certain markers to make her eligible for immunotherapy.  I have contacted [facility] and left a message to ask if MMR can be done on biopsy sample. Provided them with nursing desk number for them to return call. [phone])

## 2020-03-04 NOTE — Research (Signed)
Research RN spoke with patient via telephone this afternoon at Dr. Collie Siad request for the [employer] 2018-01 / 2018-01B protocol. The patient states that Dr. Tasia Catchings reviewed the protocol in detail with her on Friday, March 01, 2020 and she would like to participate. This research nurse will meet with the patient on Thursday morning prior to her chemotherapy education to give her a copy of the consent for her review. Her next appointment with a lab will be March 11, 2020 prior to her treatment, research will obtain central labs for the protocol at that time if the patient signs the ICF. The patient did not have any questions at this time and is in agreement with all research plans moving forward.   Jeral Fruit, RN, BSN, OCN Date: 03/04/2020 Time: 1320 pm

## 2020-03-04 NOTE — Progress Notes (Signed)
Pharmacist Chemotherapy Monitoring - Initial Assessment    Anticipated start date: 03/11/20   Regimen:  . Are orders appropriate based on the patient's diagnosis, regimen, and cycle? Yes . Does the plan date match the patient's scheduled date? Yes . Is the sequencing of drugs appropriate? Yes . Are the premedications appropriate for the patient's regimen? Yes . Prior Authorization for treatment is: Pending o If applicable, is the correct biosimilar selected based on the patient's insurance? not applicable  Organ Function and Labs: Marland Kitchen Are dose adjustments needed based on the patient's renal function, hepatic function, or hematologic function? No . Are appropriate labs ordered prior to the start of patient's treatment? Yes . Other organ system assessment, if indicated: N/A . The following baseline labs, if indicated, have been ordered: N/A  Dose Assessment: . Are the drug doses appropriate? Yes . Are the following correct: o Drug concentrations Yes o IV fluid compatible with drug Yes o Administration routes Yes o Timing of therapy Yes . If applicable, does the patient have documented access for treatment and/or plans for port-a-cath placement? yes . If applicable, have lifetime cumulative doses been properly documented and assessed? yes Lifetime Dose Tracking  No doses have been documented on this patient for the following tracked chemicals: Doxorubicin, Epirubicin, Idarubicin, Daunorubicin, Mitoxantrone, Bleomycin, Oxaliplatin, Carboplatin, Liposomal Doxorubicin  o   Toxicity Monitoring/Prevention: . The patient has the following take home antiemetics prescribed: Ondansetron and Prochlorperazine . The patient has the following take home medications prescribed: N/A . Medication allergies and previous infusion related reactions, if applicable, have been reviewed and addressed. Yes . The patient's current medication list has been assessed for drug-drug interactions with their  chemotherapy regimen. no significant drug-drug interactions were identified on review.  Order Review: . Are the treatment plan orders signed? No . Is the patient scheduled to see a provider prior to their treatment? Yes  I verify that I have reviewed each item in the above checklist and answered each question accordingly.  Adelina Mings 03/04/2020 1:23 PM

## 2020-03-04 NOTE — Telephone Encounter (Signed)
FoundationOne testing request has been submitted online. Demo sheet, cytology report and copy of insurance card faxed to [facility] medicine   Specimen: [id number] collected on 02/19/20 2 [facility] lab

## 2020-03-04 NOTE — Telephone Encounter (Signed)
Spoke with the patient and she is scheduled with Dr. Lucky Cowboy for a port placement on 03/07/20 with a 11:00 am arrival time to the MM. Covid testing on 03/05/20 between 8-1 pm at the [facility]. Pre-procedure instructions were discussed and will be mailed.

## 2020-03-04 NOTE — Telephone Encounter (Signed)
Patient called to report that she had been contacted by a GI doctor from [facility] about performing and endoscopy for stint placement. She stated that she understood from Dr. Tasia Catchings that the procedure was not longer needed and requested that Dr. Tasia Catchings contact Dr/ Mont Dutton at [facility].

## 2020-03-05 ENCOUNTER — Other Ambulatory Visit
Admission: RE | Admit: 2020-03-05 | Discharge: 2020-03-05 | Disposition: A | Discharge status: Home / Self Care | Payer: BC Managed Care – PPO | Source: Ambulatory Visit | Attending: Vascular Surgery | Admitting: Vascular Surgery

## 2020-03-05 ENCOUNTER — Other Ambulatory Visit: Payer: Self-pay

## 2020-03-05 DIAGNOSIS — Z20822 Contact with and (suspected) exposure to covid-19: Secondary | ICD-10-CM | POA: Diagnosis not present

## 2020-03-05 DIAGNOSIS — Z01812 Encounter for preprocedural laboratory examination: Secondary | ICD-10-CM | POA: Diagnosis present

## 2020-03-05 LAB — SARS CORONAVIRUS 2 (TAT 6-24 HRS): SARS Coronavirus 2: NEGATIVE

## 2020-03-05 NOTE — Telephone Encounter (Signed)
Called Dr. Mont Dutton office [phone]) and left message to call Dr. Tasia Catchings regarding patient (provided Dr. Tasia Catchings cell phone number).

## 2020-03-05 NOTE — Telephone Encounter (Addendum)
Spoke to Cassandra [name] in the [facility] Histology lab. She states that in order for them to do MMR we will need to request 1 block of tumor along with 1 H&E to be sent to [facility] pathology.  Emailed Lattie Haw in the molecular lab at [facility] for this reques, but Email not going through. Waiting on call from Lattie Haw to provide me with correct email or fax number. Specimen to be sent to [facility] path:     attn:  Wyatt Mage            Histology lab           [address]           [place] [address]

## 2020-03-05 NOTE — Patient Instructions (Signed)
Irinotecan injection What is this medicine? IRINOTECAN (ir in oh TEE kan ) is a chemotherapy drug. It is used to treat colon and rectal cancer. This medicine may be used for other purposes; ask your health care provider or pharmacist if you have questions. COMMON BRAND NAME(S): Camptosar What should I tell my health care provider before I take this medicine? They need to know if you have any of these conditions:  dehydration  diarrhea  infection (especially a virus infection such as chickenpox, cold sores, or herpes)  liver disease  low blood counts, like low white cell, platelet, or red cell counts  low levels of calcium, magnesium, or potassium in the blood  recent or ongoing radiation therapy  an unusual or allergic reaction to irinotecan, other medicines, foods, dyes, or preservatives  pregnant or trying to get pregnant  breast-feeding How should I use this medicine? This drug is given as an infusion into a vein. It is administered in a hospital or clinic by a specially trained health care professional. Talk to your pediatrician regarding the use of this medicine in children. Special care may be needed. Overdosage: If you think you have taken too much of this medicine contact a poison control center or emergency room at once. NOTE: This medicine is only for you. Do not share this medicine with others. What if I miss a dose? It is important not to miss your dose. Call your doctor or health care professional if you are unable to keep an appointment. What may interact with this medicine? This medicine may interact with the following medications:  antiviral medicines for HIV or AIDS  certain antibiotics like rifampin or rifabutin  certain medicines for fungal infections like itraconazole, ketoconazole, posaconazole, and voriconazole  certain medicines for seizures like carbamazepine, phenobarbital, phenotoin  clarithromycin  gemfibrozil  nefazodone  [facility]  Wort This list may not describe all possible interactions. Give your health care provider a list of all the medicines, herbs, non-prescription drugs, or dietary supplements you use. Also tell them if you smoke, drink alcohol, or use illegal drugs. Some items may interact with your medicine. What should I watch for while using this medicine? Your condition will be monitored carefully while you are receiving this medicine. You will need important blood work done while you are taking this medicine. This drug may make you feel generally unwell. This is not uncommon, as chemotherapy can affect healthy cells as well as cancer cells. Report any side effects. Continue your course of treatment even though you feel ill unless your doctor tells you to stop. In some cases, you may be given additional medicines to help with side effects. Follow all directions for their use. You may get drowsy or dizzy. Do not drive, use machinery, or do anything that needs mental alertness until you know how this medicine affects you. Do not stand or sit up quickly, especially if you are an older patient. This reduces the risk of dizzy or fainting spells. Call your health care professional for advice if you get a fever, chills, or sore throat, or other symptoms of a cold or flu. Do not treat yourself. This medicine decreases your body's ability to fight infections. Try to avoid being around people who are sick. Avoid taking products that contain aspirin, acetaminophen, ibuprofen, naproxen, or ketoprofen unless instructed by your doctor. These medicines may hide a fever. This medicine may increase your risk to bruise or bleed. Call your doctor or health care professional if you notice  any unusual bleeding. Be careful brushing and flossing your teeth or using a toothpick because you may get an infection or bleed more easily. If you have any dental work done, tell your dentist you are receiving this medicine. Do not become pregnant while  taking this medicine or for 6 months after stopping it. Women should inform their health care professional if they wish to become pregnant or think they might be pregnant. Men should not father a child while taking this medicine and for 3 months after stopping it. There is potential for serious side effects to an unborn child. Talk to your health care professional for more information. Do not breast-feed an infant while taking this medicine or for 7 days after stopping it. This medicine has caused ovarian failure in some women. This medicine may make it more difficult to get pregnant. Talk to your health care professional if you are concerned about your fertility. This medicine has caused decreased sperm counts in some men. This may make it more difficult to father a child. Talk to your health care professional if you are concerned about your fertility. What side effects may I notice from receiving this medicine? Side effects that you should report to your doctor or health care professional as soon as possible:  allergic reactions like skin rash, itching or hives, swelling of the face, lips, or tongue  chest pain  diarrhea  flushing, runny nose, sweating during infusion  low blood counts - this medicine may decrease the number of white blood cells, red blood cells and platelets. You may be at increased risk for infections and bleeding.  nausea, vomiting  pain, swelling, warmth in the leg  signs of decreased platelets or bleeding - bruising, pinpoint red spots on the skin, black, tarry stools, blood in the urine  signs of infection - fever or chills, cough, sore throat, pain or difficulty passing urine  signs of decreased red blood cells - unusually weak or tired, fainting spells, lightheadedness Side effects that usually do not require medical attention (report to your doctor or health care professional if they continue or are bothersome):  constipation  hair loss  headache  loss of  appetite  mouth sores  stomach pain This list may not describe all possible side effects. Call your doctor for medical advice about side effects. You may report side effects to FDA at 1-800-FDA-1088. Where should I keep my medicine? This drug is given in a hospital or clinic and will not be stored at home. NOTE: This sheet is a summary. It may not cover all possible information. If you have questions about this medicine, talk to your doctor, pharmacist, or health care provider.  2020 Elsevier/Gold Standard (2018-08-04 10:09:17) Oxaliplatin Injection What is this medicine? OXALIPLATIN (ox AL i PLA tin) is a chemotherapy drug. It targets fast dividing cells, like cancer cells, and causes these cells to die. This medicine is used to treat cancers of the colon and rectum, and many other cancers. This medicine may be used for other purposes; ask your health care provider or pharmacist if you have questions. COMMON BRAND NAME(S): Eloxatin What should I tell my health care provider before I take this medicine? They need to know if you have any of these conditions:  heart disease  history of irregular heartbeat  liver disease  low blood counts, like white cells, platelets, or red blood cells  lung or breathing disease, like asthma  take medicines that treat or prevent blood clots  tingling of  the fingers or toes, or other nerve disorder  an unusual or allergic reaction to oxaliplatin, other chemotherapy, other medicines, foods, dyes, or preservatives  pregnant or trying to get pregnant  breast-feeding How should I use this medicine? This drug is given as an infusion into a vein. It is administered in a hospital or clinic by a specially trained health care professional. Talk to your pediatrician regarding the use of this medicine in children. Special care may be needed. Overdosage: If you think you have taken too much of this medicine contact a poison control center or emergency room  at once. NOTE: This medicine is only for you. Do not share this medicine with others. What if I miss a dose? It is important not to miss a dose. Call your doctor or health care professional if you are unable to keep an appointment. What may interact with this medicine? Do not take this medicine with any of the following medications:  cisapride  dronedarone  pimozide  thioridazine This medicine may also interact with the following medications:  aspirin and aspirin-like medicines  certain medicines that treat or prevent blood clots like warfarin, apixaban, dabigatran, and rivaroxaban  cisplatin  cyclosporine  diuretics  medicines for infection like acyclovir, adefovir, amphotericin B, bacitracin, cidofovir, foscarnet, ganciclovir, gentamicin, pentamidine, vancomycin  NSAIDs, medicines for pain and inflammation, like ibuprofen or naproxen  other medicines that prolong the QT interval (an abnormal heart rhythm)  pamidronate  zoledronic acid This list may not describe all possible interactions. Give your health care provider a list of all the medicines, herbs, non-prescription drugs, or dietary supplements you use. Also tell them if you smoke, drink alcohol, or use illegal drugs. Some items may interact with your medicine. What should I watch for while using this medicine? Your condition will be monitored carefully while you are receiving this medicine. You may need blood work done while you are taking this medicine. This medicine may make you feel generally unwell. This is not uncommon as chemotherapy can affect healthy cells as well as cancer cells. Report any side effects. Continue your course of treatment even though you feel ill unless your healthcare professional tells you to stop. This medicine can make you more sensitive to cold. Do not drink cold drinks or use ice. Cover exposed skin before coming in contact with cold temperatures or cold objects. When out in cold weather  wear warm clothing and cover your mouth and nose to warm the air that goes into your lungs. Tell your doctor if you get sensitive to the cold. Do not become pregnant while taking this medicine or for 9 months after stopping it. Women should inform their health care professional if they wish to become pregnant or think they might be pregnant. Men should not father a child while taking this medicine and for 6 months after stopping it. There is potential for serious side effects to an unborn child. Talk to your health care professional for more information. Do not breast-feed a child while taking this medicine or for 3 months after stopping it. This medicine has caused ovarian failure in some women. This medicine may make it more difficult to get pregnant. Talk to your health care professional if you are concerned about your fertility. This medicine has caused decreased sperm counts in some men. This may make it more difficult to father a child. Talk to your health care professional if you are concerned about your fertility. This medicine may increase your risk of getting an  infection. Call your health care professional for advice if you get a fever, chills, or sore throat, or other symptoms of a cold or flu. Do not treat yourself. Try to avoid being around people who are sick. Avoid taking medicines that contain aspirin, acetaminophen, ibuprofen, naproxen, or ketoprofen unless instructed by your health care professional. These medicines may hide a fever. Be careful brushing or flossing your teeth or using a toothpick because you may get an infection or bleed more easily. If you have any dental work done, tell your dentist you are receiving this medicine. What side effects may I notice from receiving this medicine? Side effects that you should report to your doctor or health care professional as soon as possible:  allergic reactions like skin rash, itching or hives, swelling of the face, lips, or  tongue  breathing problems  cough  low blood counts - this medicine may decrease the number of white blood cells, red blood cells, and platelets. You may be at increased risk for infections and bleeding  nausea, vomiting  pain, redness, or irritation at site where injected  pain, tingling, numbness in the hands or feet  signs and symptoms of bleeding such as bloody or black, tarry stools; red or dark brown urine; spitting up blood or brown material that looks like coffee grounds; red spots on the skin; unusual bruising or bleeding from the eyes, gums, or nose  signs and symptoms of a dangerous change in heartbeat or heart rhythm like chest pain; dizziness; fast, irregular heartbeat; palpitations; feeling faint or lightheaded; falls  signs and symptoms of infection like fever; chills; cough; sore throat; pain or trouble passing urine  signs and symptoms of liver injury like dark yellow or brown urine; general ill feeling or flu-like symptoms; light-colored stools; loss of appetite; nausea; right upper belly pain; unusually weak or tired; yellowing of the eyes or skin  signs and symptoms of low red blood cells or anemia such as unusually weak or tired; feeling faint or lightheaded; falls  signs and symptoms of muscle injury like dark urine; trouble passing urine or change in the amount of urine; unusually weak or tired; muscle pain; back pain Side effects that usually do not require medical attention (report to your doctor or health care professional if they continue or are bothersome):  changes in taste  diarrhea  gas  hair loss  loss of appetite  mouth sores This list may not describe all possible side effects. Call your doctor for medical advice about side effects. You may report side effects to FDA at 1-800-FDA-1088. Where should I keep my medicine? This drug is given in a hospital or clinic and will not be stored at home. NOTE: This sheet is a summary. It may not cover all  possible information. If you have questions about this medicine, talk to your doctor, pharmacist, or health care provider.  2020 Elsevier/Gold Standard (2018-11-01 12:20:35) Fluorouracil, 5-FU injection What is this medicine? FLUOROURACIL, 5-FU (flure oh YOOR a sil) is a chemotherapy drug. It slows the growth of cancer cells. This medicine is used to treat many types of cancer like breast cancer, colon or rectal cancer, pancreatic cancer, and stomach cancer. This medicine may be used for other purposes; ask your health care provider or pharmacist if you have questions. COMMON BRAND NAME(S): Adrucil What should I tell my health care provider before I take this medicine? They need to know if you have any of these conditions:  blood disorders  dihydropyrimidine dehydrogenase (  DPD) deficiency  infection (especially a virus infection such as chickenpox, cold sores, or herpes)  kidney disease  liver disease  malnourished, poor nutrition  recent or ongoing radiation therapy  an unusual or allergic reaction to fluorouracil, other chemotherapy, other medicines, foods, dyes, or preservatives  pregnant or trying to get pregnant  breast-feeding How should I use this medicine? This drug is given as an infusion or injection into a vein. It is administered in a hospital or clinic by a specially trained health care professional. Talk to your pediatrician regarding the use of this medicine in children. Special care may be needed. Overdosage: If you think you have taken too much of this medicine contact a poison control center or emergency room at once. NOTE: This medicine is only for you. Do not share this medicine with others. What if I miss a dose? It is important not to miss your dose. Call your doctor or health care professional if you are unable to keep an appointment. What may interact with this  medicine?  allopurinol  cimetidine  dapsone  digoxin  hydroxyurea  leucovorin  levamisole  medicines for seizures like ethotoin, fosphenytoin, phenytoin  medicines to increase blood counts like filgrastim, pegfilgrastim, sargramostim  medicines that treat or prevent blood clots like warfarin, enoxaparin, and dalteparin  methotrexate  metronidazole  pyrimethamine  some other chemotherapy drugs like busulfan, cisplatin, estramustine, vinblastine  trimethoprim  trimetrexate  vaccines Talk to your doctor or health care professional before taking any of these medicines:  acetaminophen  aspirin  ibuprofen  ketoprofen  naproxen This list may not describe all possible interactions. Give your health care provider a list of all the medicines, herbs, non-prescription drugs, or dietary supplements you use. Also tell them if you smoke, drink alcohol, or use illegal drugs. Some items may interact with your medicine. What should I watch for while using this medicine? Visit your doctor for checks on your progress. This drug may make you feel generally unwell. This is not uncommon, as chemotherapy can affect healthy cells as well as cancer cells. Report any side effects. Continue your course of treatment even though you feel ill unless your doctor tells you to stop. In some cases, you may be given additional medicines to help with side effects. Follow all directions for their use. Call your doctor or health care professional for advice if you get a fever, chills or sore throat, or other symptoms of a cold or flu. Do not treat yourself. This drug decreases your body's ability to fight infections. Try to avoid being around people who are sick. This medicine may increase your risk to bruise or bleed. Call your doctor or health care professional if you notice any unusual bleeding. Be careful brushing and flossing your teeth or using a toothpick because you may get an infection or bleed  more easily. If you have any dental work done, tell your dentist you are receiving this medicine. Avoid taking products that contain aspirin, acetaminophen, ibuprofen, naproxen, or ketoprofen unless instructed by your doctor. These medicines may hide a fever. Do not become pregnant while taking this medicine. Women should inform their doctor if they wish to become pregnant or think they might be pregnant. There is a potential for serious side effects to an unborn child. Talk to your health care professional or pharmacist for more information. Do not breast-feed an infant while taking this medicine. Men should inform their doctor if they wish to father a child. This medicine may lower sperm  counts. Do not treat diarrhea with over the counter products. Contact your doctor if you have diarrhea that lasts more than 2 days or if it is severe and watery. This medicine can make you more sensitive to the sun. Keep out of the sun. If you cannot avoid being in the sun, wear protective clothing and use sunscreen. Do not use sun lamps or tanning beds/booths. What side effects may I notice from receiving this medicine? Side effects that you should report to your doctor or health care professional as soon as possible:  allergic reactions like skin rash, itching or hives, swelling of the face, lips, or tongue  low blood counts - this medicine may decrease the number of white blood cells, red blood cells and platelets. You may be at increased risk for infections and bleeding.  signs of infection - fever or chills, cough, sore throat, pain or difficulty passing urine  signs of decreased platelets or bleeding - bruising, pinpoint red spots on the skin, black, tarry stools, blood in the urine  signs of decreased red blood cells - unusually weak or tired, fainting spells, lightheadedness  breathing problems  changes in vision  chest pain  mouth sores  nausea and vomiting  pain, swelling, redness at site  where injected  pain, tingling, numbness in the hands or feet  redness, swelling, or sores on hands or feet  stomach pain  unusual bleeding Side effects that usually do not require medical attention (report to your doctor or health care professional if they continue or are bothersome):  changes in finger or toe nails  diarrhea  dry or itchy skin  hair loss  headache  loss of appetite  sensitivity of eyes to the light  stomach upset  unusually teary eyes This list may not describe all possible side effects. Call your doctor for medical advice about side effects. You may report side effects to FDA at 1-800-FDA-1088. Where should I keep my medicine? This drug is given in a hospital or clinic and will not be stored at home. NOTE: This sheet is a summary. It may not cover all possible information. If you have questions about this medicine, talk to your doctor, pharmacist, or health care provider.  2020 Elsevier/Gold Standard (2007-10-18 13:53:16) Leucovorin injection What is this medicine? LEUCOVORIN (loo koe VOR in) is used to prevent or treat the harmful effects of some medicines. This medicine is used to treat anemia caused by a low amount of folic acid in the body. It is also used with 5-fluorouracil (5-FU) to treat colon cancer. This medicine may be used for other purposes; ask your health care provider or pharmacist if you have questions. What should I tell my health care provider before I take this medicine? They need to know if you have any of these conditions:  anemia from low levels of vitamin B-12 in the blood  an unusual or allergic reaction to leucovorin, folic acid, other medicines, foods, dyes, or preservatives  pregnant or trying to get pregnant  breast-feeding How should I use this medicine? This medicine is for injection into a muscle or into a vein. It is given by a health care professional in a hospital or clinic setting. Talk to your pediatrician  regarding the use of this medicine in children. Special care may be needed. Overdosage: If you think you have taken too much of this medicine contact a poison control center or emergency room at once. NOTE: This medicine is only for you. Do not share  this medicine with others. What if I miss a dose? This does not apply. What may interact with this medicine?  capecitabine  fluorouracil  phenobarbital  phenytoin  primidone  trimethoprim-sulfamethoxazole This list may not describe all possible interactions. Give your health care provider a list of all the medicines, herbs, non-prescription drugs, or dietary supplements you use. Also tell them if you smoke, drink alcohol, or use illegal drugs. Some items may interact with your medicine. What should I watch for while using this medicine? Your condition will be monitored carefully while you are receiving this medicine. This medicine may increase the side effects of 5-fluorouracil, 5-FU. Tell your doctor or health care professional if you have diarrhea or mouth sores that do not get better or that get worse. What side effects may I notice from receiving this medicine? Side effects that you should report to your doctor or health care professional as soon as possible:  allergic reactions like skin rash, itching or hives, swelling of the face, lips, or tongue  breathing problems  fever, infection  mouth sores  unusual bleeding or bruising  unusually weak or tired Side effects that usually do not require medical attention (report to your doctor or health care professional if they continue or are bothersome):  constipation or diarrhea  loss of appetite  nausea, vomiting This list may not describe all possible side effects. Call your doctor for medical advice about side effects. You may report side effects to FDA at 1-800-FDA-1088. Where should I keep my medicine? This drug is given in a hospital or clinic and will not be stored at  home. NOTE: This sheet is a summary. It may not cover all possible information. If you have questions about this medicine, talk to your doctor, pharmacist, or health care provider.  2020 Elsevier/Gold Standard (2007-12-19 16:50:29)

## 2020-03-06 ENCOUNTER — Inpatient Hospital Stay: Payer: BC Managed Care – PPO | Admitting: Nurse Practitioner

## 2020-03-06 ENCOUNTER — Ambulatory Visit: Payer: BC Managed Care – PPO | Admitting: Gastroenterology

## 2020-03-06 ENCOUNTER — Other Ambulatory Visit (INDEPENDENT_AMBULATORY_CARE_PROVIDER_SITE_OTHER): Payer: Self-pay | Admitting: Nurse Practitioner

## 2020-03-06 ENCOUNTER — Inpatient Hospital Stay: Payer: BC Managed Care – PPO

## 2020-03-06 DIAGNOSIS — C259 Malignant neoplasm of pancreas, unspecified: Secondary | ICD-10-CM

## 2020-03-06 NOTE — Research (Signed)
Research nurse met with the patient this morning prior to her chemotherapy education class and provided her with a copy of the [employer] for the [employer] study and sub-study. The patient wants to review the protocol consent forms prior to her March 11, 2020 visit in order to decide whether to participate or not. The patient was informed that her participation is strictly voluntary and her care will not change regardless of her participation in the study. Encouraged the patient to call prior to her next appointment with her decision so appropriate plans can be put in place if she does want to participate. The patient is in agreement with these arrangements.   Jeral Fruit, RN, BSN, OCN Date: 03/06/2020 Time: 0845 am

## 2020-03-06 NOTE — Telephone Encounter (Signed)
Left message for Dr. Mont Dutton to call Dr. Felipa Evener cell phone.

## 2020-03-07 ENCOUNTER — Encounter
Admission: RE | Disposition: A | Discharge status: Home / Self Care | Payer: Self-pay | Source: Home / Self Care | Attending: Vascular Surgery

## 2020-03-07 ENCOUNTER — Other Ambulatory Visit: Payer: Self-pay

## 2020-03-07 ENCOUNTER — Encounter: Payer: Self-pay | Admitting: Vascular Surgery

## 2020-03-07 ENCOUNTER — Ambulatory Visit
Admission: RE | Admit: 2020-03-07 | Discharge: 2020-03-07 | Disposition: A | Discharge status: Home / Self Care | Payer: BC Managed Care – PPO | Attending: Vascular Surgery | Admitting: Vascular Surgery

## 2020-03-07 DIAGNOSIS — C259 Malignant neoplasm of pancreas, unspecified: Secondary | ICD-10-CM

## 2020-03-07 HISTORY — DX: Malignant (primary) neoplasm, unspecified: C80.1

## 2020-03-07 HISTORY — PX: PORTA CATH INSERTION: CATH118285

## 2020-03-07 SURGERY — PORTA CATH INSERTION
Anesthesia: Moderate Sedation

## 2020-03-07 MED ORDER — DIPHENHYDRAMINE HCL 50 MG/ML IJ SOLN: 50.0000 mg | Freq: Once | INTRAMUSCULAR | Status: DC | PRN

## 2020-03-07 MED ORDER — SODIUM CHLORIDE 0.9 % IV SOLN
Freq: Once | INTRAVENOUS | Status: DC
  Filled 2020-03-07 (×2): qty 2

## 2020-03-07 MED ORDER — FAMOTIDINE 20 MG PO TABS: 40.0000 mg | ORAL_TABLET | Freq: Once | ORAL | Status: DC | PRN

## 2020-03-07 MED ORDER — CLINDAMYCIN PHOSPHATE 300 MG/50ML IV SOLN: 300.0000 mg | Freq: Once | INTRAVENOUS | Status: AC

## 2020-03-07 MED ORDER — MIDAZOLAM HCL 2 MG/ML PO SYRP: 8.0000 mg | ORAL_SOLUTION | Freq: Once | ORAL | Status: DC | PRN

## 2020-03-07 MED ORDER — HYDROMORPHONE HCL 1 MG/ML IJ SOLN: 1.0000 mg | Freq: Once | INTRAMUSCULAR | Status: DC | PRN

## 2020-03-07 MED ORDER — MIDAZOLAM HCL 2 MG/2ML IJ SOLN
INTRAMUSCULAR | Status: DC | PRN
  Administered 2020-03-07: 2 mg via INTRAVENOUS

## 2020-03-07 MED ORDER — CHLORHEXIDINE GLUCONATE CLOTH 2 % EX PADS
6.0000 | MEDICATED_PAD | Freq: Every day | CUTANEOUS | Status: DC
  Administered 2020-03-07: 6 via TOPICAL

## 2020-03-07 MED ORDER — SODIUM CHLORIDE 0.9 % IV SOLN: INTRAVENOUS | Status: DC

## 2020-03-07 MED ORDER — ONDANSETRON HCL 4 MG/2ML IJ SOLN: 4.0000 mg | Freq: Four times a day (QID) | INTRAMUSCULAR | Status: DC | PRN

## 2020-03-07 MED ORDER — CLINDAMYCIN PHOSPHATE 300 MG/50ML IV SOLN
INTRAVENOUS | Status: AC
  Administered 2020-03-07: 200 mg via INTRAVENOUS
  Filled 2020-03-07: qty 50

## 2020-03-07 MED ORDER — FENTANYL CITRATE (PF) 100 MCG/2ML IJ SOLN
INTRAMUSCULAR | Status: DC | PRN
Start: 2020-03-07 — End: 2020-03-07
  Administered 2020-03-07: 50 ug via INTRAVENOUS

## 2020-03-07 MED ORDER — METHYLPREDNISOLONE SODIUM SUCC 125 MG IJ SOLR: 125.0000 mg | Freq: Once | INTRAMUSCULAR | Status: DC | PRN

## 2020-03-07 MED ORDER — FENTANYL CITRATE (PF) 100 MCG/2ML IJ SOLN
INTRAMUSCULAR | Status: AC
  Filled 2020-03-07: qty 2

## 2020-03-07 MED ORDER — MIDAZOLAM HCL 2 MG/2ML IJ SOLN
INTRAMUSCULAR | Status: AC
  Filled 2020-03-07: qty 2

## 2020-03-07 SURGICAL SUPPLY — 8 items
DERMABOND ADVANCED (GAUZE/BANDAGES/DRESSINGS) ×1
DERMABOND ADVANCED .7 DNX12 (GAUZE/BANDAGES/DRESSINGS) ×1 IMPLANT
KIT PORT POWER 8FR ISP CVUE (Port) ×2 IMPLANT
PACK ANGIOGRAPHY (CUSTOM PROCEDURE TRAY) ×2 IMPLANT
SUT MNCRL AB 4-0 PS2 18 (SUTURE) ×2 IMPLANT
SUT PROLENE 0 SH 30 (SUTURE) ×2 IMPLANT
SUT VIC AB 3-0 SH 27 (SUTURE) ×1
SUT VIC AB 3-0 SH 27X BRD (SUTURE) ×1 IMPLANT

## 2020-03-07 NOTE — Interval H&P Note (Signed)
History and Physical Interval Note:  03/07/2020 9:34 AM  Cassandra [name]  has presented today for surgery, with the diagnosis of Porta Cath Placement   Pancreatic CA Covid  Sept 8.  The various methods of treatment have been discussed with the patient and family. After consideration of risks, benefits and other options for treatment, the patient has consented to  Procedure(s): PORTA CATH INSERTION (N/A) as a surgical intervention.  The patient's history has been reviewed, patient examined, no change in status, stable for surgery.  I have reviewed the patient's chart and labs.  Questions were answered to the patient's satisfaction.     Leotis Pain

## 2020-03-07 NOTE — Op Note (Signed)
      [facility]       Operative Note  Date: 03/07/2020  Preoperative diagnosis:  1. Pancreatic cancer  Postoperative diagnosis:  Same as above  Procedures: #1. Ultrasound guidance for vascular access to the right internal jugular vein. #2. Fluoroscopic guidance for placement of catheter. #3. Placement of CT compatible Port-A-Cath, right internal jugular vein.  Surgeon: Leotis Pain, MD.   Anesthesia: Local with moderate conscious sedation for approximately 30  minutes using 2 mg of Versed and 50 mcg of Fentanyl  Fluoroscopy time: less than 1 minute  Contrast used: 0  Estimated blood loss: 3 cc  Indication for the procedure:  The patient is a 64 y.o.female with pancreatic cancer.  The patient needs a Port-A-Cath for durable venous access, chemotherapy, lab draws, and CT scans. We are asked to place this. Risks and benefits were discussed and informed consent was obtained.  Description of procedure: The patient was brought to the vascular and interventional radiology suite.  Moderate conscious sedation was administered throughout the procedure during a face to face encounter with the patient with my supervision of the RN administering medicines and monitoring the patient's vital signs, pulse oximetry, telemetry and mental status throughout from the start of the procedure until the patient was taken to the recovery room. The right neck chest and shoulder were sterilely prepped and draped, and a sterile surgical field was created. Ultrasound was used to help visualize a patent right internal jugular vein. This was then accessed under direct ultrasound guidance without difficulty with the Seldinger needle and a permanent image was recorded. A J-wire was placed. After skin nick and dilatation, the peel-away sheath was then placed over the wire. I then anesthetized an area under the clavicle approximately 1-2 fingerbreadths. A transverse incision was created and an inferior  pocket was created with electrocautery and blunt dissection. The port was then brought onto the field, placed into the pocket and secured to the chest wall with 2 Prolene sutures. The catheter was connected to the port and tunneled from the subclavicular incision to the access site. Fluoroscopic guidance was then used to cut the catheter to an appropriate length. The catheter was then placed through the peel-away sheath and the peel-away sheath was removed. The catheter tip was parked in excellent location under fluorocoscopic guidance in the cavoatrial junction. The pocket was then irrigated with antibiotic impregnated saline and the wound was closed with a running 3-0 Vicryl and a 4-0 Monocryl. The access incision was closed with a single 4-0 Monocryl. The Huber needle was used to withdraw blood and flush the port with heparinized saline. Dermabond was then placed as a dressing. The patient tolerated the procedure well and was taken to the recovery room in stable condition.   Leotis Pain 03/07/2020 10:36 AM   This note was created with Dragon Medical transcription system. Any errors in dictation are purely unintentional.

## 2020-03-07 NOTE — Telephone Encounter (Signed)
Called [facility] and left VM for [employer] Soil scientist) to check if request/email was received. Asked for her to call back to let us know.

## 2020-03-07 NOTE — Discharge Instructions (Signed)
Implanted Port Insertion, Care After °This sheet gives you information about how to care for yourself after your procedure. Your health care provider may also give you more specific instructions. If you have problems or questions, contact your health care provider. °What can I expect after the procedure? °After the procedure, it is common to have: °· Discomfort at the port insertion site. °· Bruising on the skin over the port. This should improve over 3-4 days. °Follow these instructions at home: °Port care °· After your port is placed, you will get a manufacturer's information card. The card has information about your port. Keep this card with you at all times. °· Take care of the port as told by your health care provider. Ask your health care provider if you or a family member can get training for taking care of the port at home. A home health care nurse may also take care of the port. °· Make sure to remember what type of port you have. °Incision care ° °  ° °· Follow instructions from your health care provider about how to take care of your port insertion site. Make sure you: °? Wash your hands with soap and water before and after you change your bandage (dressing). If soap and water are not available, use hand sanitizer. °? Change your dressing as told by your health care provider. °? Leave stitches (sutures), skin glue, or adhesive strips in place. These skin closures may need to stay in place for 2 weeks or longer. If adhesive strip edges start to loosen and curl up, you may trim the loose edges. Do not remove adhesive strips completely unless your health care provider tells you to do that. °· Check your port insertion site every day for signs of infection. Check for: °? Redness, swelling, or pain. °? Fluid or blood. °? Warmth. °? Pus or a bad smell. °Activity °· Return to your normal activities as told by your health care provider. Ask your health care provider what activities are safe for you. °· Do not  lift anything that is heavier than 10 lb (4.5 kg), or the limit that you are told, until your health care provider says that it is safe. °General instructions °· Take over-the-counter and prescription medicines only as told by your health care provider. °· Do not take baths, swim, or use a hot tub until your health care provider approves. Ask your health care provider if you may take showers. You may only be allowed to take sponge baths. °· Do not drive for 24 hours if you were given a sedative during your procedure. °· Wear a medical alert bracelet in case of an emergency. This will tell any health care providers that you have a port. °· Keep all follow-up visits as told by your health care provider. This is important. °Contact a health care provider if: °· You cannot flush your port with saline as directed, or you cannot draw blood from the port. °· You have a fever or chills. °· You have redness, swelling, or pain around your port insertion site. °· You have fluid or blood coming from your port insertion site. °· Your port insertion site feels warm to the touch. °· You have pus or a bad smell coming from the port insertion site. °Get help right away if: °· You have chest pain or shortness of breath. °· You have bleeding from your port that you cannot control. °Summary °· Take care of the port as told by your health   care provider. Keep the manufacturer's information card with you at all times. °· Change your dressing as told by your health care provider. °· Contact a health care provider if you have a fever or chills or if you have redness, swelling, or pain around your port insertion site. °· Keep all follow-up visits as told by your health care provider. °This information is not intended to replace advice given to you by your health care provider. Make sure you discuss any questions you have with your health care provider. °Document Revised: 01/10/2018 Document Reviewed: 01/10/2018 °Elsevier Patient Education ©  2020 [employer] Inc. ° °

## 2020-03-11 ENCOUNTER — Encounter: Payer: Self-pay | Admitting: Oncology

## 2020-03-11 ENCOUNTER — Observation Stay
Admission: EM | Admit: 2020-03-11 | Discharge: 2020-03-12 | Disposition: A | Discharge status: Home / Self Care | Payer: BC Managed Care – PPO | Attending: Internal Medicine | Admitting: Internal Medicine

## 2020-03-11 ENCOUNTER — Other Ambulatory Visit: Payer: Self-pay

## 2020-03-11 ENCOUNTER — Inpatient Hospital Stay: Payer: BC Managed Care – PPO

## 2020-03-11 ENCOUNTER — Encounter: Payer: Self-pay | Admitting: Emergency Medicine

## 2020-03-11 ENCOUNTER — Emergency Department: Payer: BC Managed Care – PPO

## 2020-03-11 ENCOUNTER — Inpatient Hospital Stay (HOSPITAL_BASED_OUTPATIENT_CLINIC_OR_DEPARTMENT_OTHER): Payer: BC Managed Care – PPO | Admitting: Oncology

## 2020-03-11 VITALS — BP 129/88 | HR 64 | Temp 98.7°F | Resp 16 | Wt 145.3 lb

## 2020-03-11 VITALS — BP 146/77 | HR 73 | Resp 18

## 2020-03-11 DIAGNOSIS — Z8507 Personal history of malignant neoplasm of pancreas: Secondary | ICD-10-CM | POA: Insufficient documentation

## 2020-03-11 DIAGNOSIS — R739 Hyperglycemia, unspecified: Secondary | ICD-10-CM | POA: Diagnosis not present

## 2020-03-11 DIAGNOSIS — C259 Malignant neoplasm of pancreas, unspecified: Secondary | ICD-10-CM

## 2020-03-11 DIAGNOSIS — Z809 Family history of malignant neoplasm, unspecified: Secondary | ICD-10-CM | POA: Diagnosis not present

## 2020-03-11 DIAGNOSIS — R4702 Dysphasia: Secondary | ICD-10-CM

## 2020-03-11 DIAGNOSIS — G459 Transient cerebral ischemic attack, unspecified: Secondary | ICD-10-CM | POA: Diagnosis not present

## 2020-03-11 DIAGNOSIS — Z20822 Contact with and (suspected) exposure to covid-19: Secondary | ICD-10-CM | POA: Insufficient documentation

## 2020-03-11 DIAGNOSIS — Z5111 Encounter for antineoplastic chemotherapy: Secondary | ICD-10-CM | POA: Insufficient documentation

## 2020-03-11 DIAGNOSIS — Z95828 Presence of other vascular implants and grafts: Secondary | ICD-10-CM

## 2020-03-11 DIAGNOSIS — Z87891 Personal history of nicotine dependence: Secondary | ICD-10-CM | POA: Diagnosis not present

## 2020-03-11 DIAGNOSIS — C250 Malignant neoplasm of head of pancreas: Secondary | ICD-10-CM | POA: Diagnosis not present

## 2020-03-11 DIAGNOSIS — Z79899 Other long term (current) drug therapy: Secondary | ICD-10-CM | POA: Insufficient documentation

## 2020-03-11 DIAGNOSIS — R4781 Slurred speech: Secondary | ICD-10-CM | POA: Diagnosis present

## 2020-03-11 LAB — CBC WITH DIFFERENTIAL/PLATELET
Abs Immature Granulocytes: 0.02 10*3/uL (ref 0.00–0.07)
Basophils Absolute: 0 10*3/uL (ref 0.0–0.1)
Basophils Relative: 1 %
Eosinophils Absolute: 0.1 10*3/uL (ref 0.0–0.5)
Eosinophils Relative: 2 %
HCT: 31.4 % — ABNORMAL LOW (ref 36.0–46.0)
Hemoglobin: 11.2 g/dL — ABNORMAL LOW (ref 12.0–15.0)
Immature Granulocytes: 0 %
Lymphocytes Relative: 26 %
Lymphs Abs: 1.4 10*3/uL (ref 0.7–4.0)
MCH: 32.2 pg (ref 26.0–34.0)
MCHC: 35.7 g/dL (ref 30.0–36.0)
MCV: 90.2 fL (ref 80.0–100.0)
Monocytes Absolute: 0.4 10*3/uL (ref 0.1–1.0)
Monocytes Relative: 8 %
Neutro Abs: 3.3 10*3/uL (ref 1.7–7.7)
Neutrophils Relative %: 63 %
Platelets: 269 10*3/uL (ref 150–400)
RBC: 3.48 MIL/uL — ABNORMAL LOW (ref 3.87–5.11)
RDW: 13 % (ref 11.5–15.5)
WBC: 5.3 10*3/uL (ref 4.0–10.5)
nRBC: 0 % (ref 0.0–0.2)

## 2020-03-11 LAB — DIFFERENTIAL
Abs Immature Granulocytes: 0.05 10*3/uL (ref 0.00–0.07)
Basophils Absolute: 0 10*3/uL (ref 0.0–0.1)
Basophils Relative: 0 %
Eosinophils Absolute: 0 10*3/uL (ref 0.0–0.5)
Eosinophils Relative: 0 %
Immature Granulocytes: 1 %
Lymphocytes Relative: 8 %
Lymphs Abs: 0.5 10*3/uL — ABNORMAL LOW (ref 0.7–4.0)
Monocytes Absolute: 0.1 10*3/uL (ref 0.1–1.0)
Monocytes Relative: 1 %
Neutro Abs: 6.2 10*3/uL (ref 1.7–7.7)
Neutrophils Relative %: 90 %

## 2020-03-11 LAB — COMPREHENSIVE METABOLIC PANEL
ALT: 61 U/L — ABNORMAL HIGH (ref 0–44)
ALT: 62 U/L — ABNORMAL HIGH (ref 0–44)
AST: 37 U/L (ref 15–41)
AST: 34 U/L (ref 15–41)
Albumin: 3.2 g/dL — ABNORMAL LOW (ref 3.5–5.0)
Albumin: 3.5 g/dL (ref 3.5–5.0)
Alkaline Phosphatase: 148 U/L — ABNORMAL HIGH (ref 38–126)
Alkaline Phosphatase: 162 U/L — ABNORMAL HIGH (ref 38–126)
Anion gap: 8 (ref 5–15)
Anion gap: 8 (ref 5–15)
BUN: 10 mg/dL (ref 8–23)
BUN: 8 mg/dL (ref 8–23)
CO2: 26 mmol/L (ref 22–32)
CO2: 27 mmol/L (ref 22–32)
Calcium: 8.5 mg/dL — ABNORMAL LOW (ref 8.9–10.3)
Calcium: 8.8 mg/dL — ABNORMAL LOW (ref 8.9–10.3)
Chloride: 105 mmol/L (ref 98–111)
Chloride: 106 mmol/L (ref 98–111)
Creatinine, Ser: 0.72 mg/dL (ref 0.44–1.00)
Creatinine, Ser: 0.63 mg/dL (ref 0.44–1.00)
GFR calc Af Amer: >60 mL/min (ref >60)
GFR calc Af Amer: >60 mL/min (ref >60)
GFR calc non Af Amer: >60 mL/min (ref >60)
GFR calc non Af Amer: >60 mL/min (ref >60)
Glucose, Bld: 208 mg/dL — ABNORMAL HIGH (ref 70–99)
Glucose, Bld: 242 mg/dL — ABNORMAL HIGH (ref 70–99)
Potassium: 4 mmol/L (ref 3.5–5.1)
Potassium: 4 mmol/L (ref 3.5–5.1)
Sodium: 139 mmol/L (ref 135–145)
Sodium: 141 mmol/L (ref 135–145)
Total Bilirubin: 1.8 mg/dL — ABNORMAL HIGH (ref 0.3–1.2)
Total Bilirubin: 2 mg/dL — ABNORMAL HIGH (ref 0.3–1.2)
Total Protein: 6.2 g/dL — ABNORMAL LOW (ref 6.5–8.1)
Total Protein: 6.7 g/dL (ref 6.5–8.1)

## 2020-03-11 LAB — GLUCOSE, CAPILLARY: Glucose-Capillary: 224 mg/dL — ABNORMAL HIGH (ref 70–99)

## 2020-03-11 LAB — CBC
HCT: 32.6 % — ABNORMAL LOW (ref 36.0–46.0)
Hemoglobin: 11.7 g/dL — ABNORMAL LOW (ref 12.0–15.0)
MCH: 32.6 pg (ref 26.0–34.0)
MCHC: 35.9 g/dL (ref 30.0–36.0)
MCV: 90.8 fL (ref 80.0–100.0)
Platelets: 309 10*3/uL (ref 150–400)
RBC: 3.59 MIL/uL — ABNORMAL LOW (ref 3.87–5.11)
RDW: 13.2 % (ref 11.5–15.5)
WBC: 6.8 10*3/uL (ref 4.0–10.5)
nRBC: 0 % (ref 0.0–0.2)

## 2020-03-11 LAB — PROTIME-INR
INR: 1 (ref 0.8–1.2)
Prothrombin Time: 13 seconds (ref 11.4–15.2)

## 2020-03-11 LAB — SARS CORONAVIRUS 2 BY RT PCR (HOSPITAL ORDER, PERFORMED IN ~~LOC~~ HOSPITAL LAB): SARS Coronavirus 2: NEGATIVE

## 2020-03-11 LAB — APTT: aPTT: 30 seconds (ref 24–36)

## 2020-03-11 IMAGING — CT CT HEAD W/O CM
3 of 4 series · 14 of 47 positions shown, 16 images · non-contrast
Comparison: None.

CLINICAL DATA: Slurred speech

EXAM:
CT HEAD WITHOUT CONTRAST
TECHNIQUE: Contiguous axial images were obtained from the base of the skull
through the vertex without intravenous contrast.

[Series 3: ax head wo · axial · 0.32mm/px · z∈[+462,+590]mm · 8 of 32 slices shown, 10 images]
[im 3/32  brain]
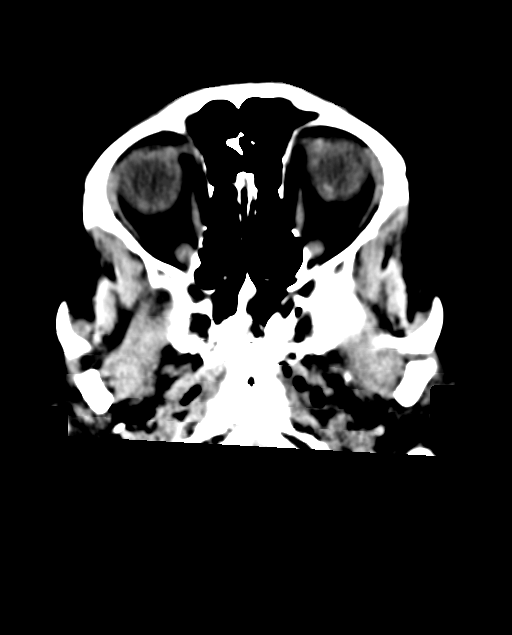
[im 3/32  bone]
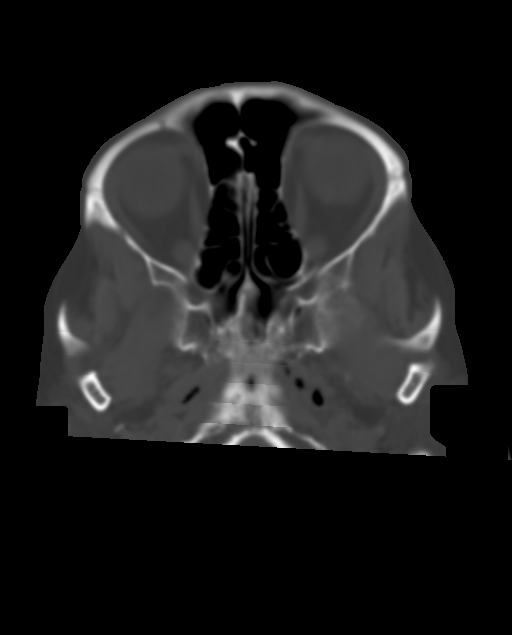
[im 7/32  brain]
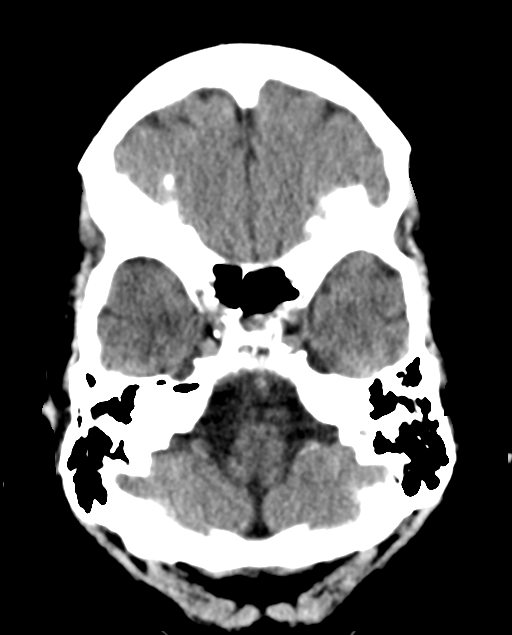
[im 12/32  brain]
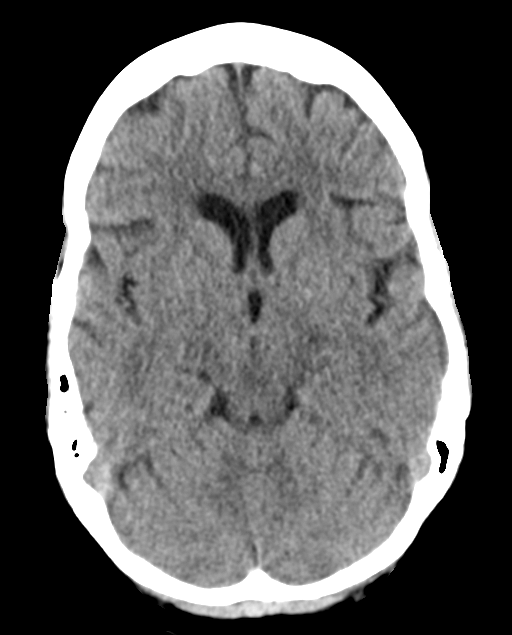
[im 14/32  brain]
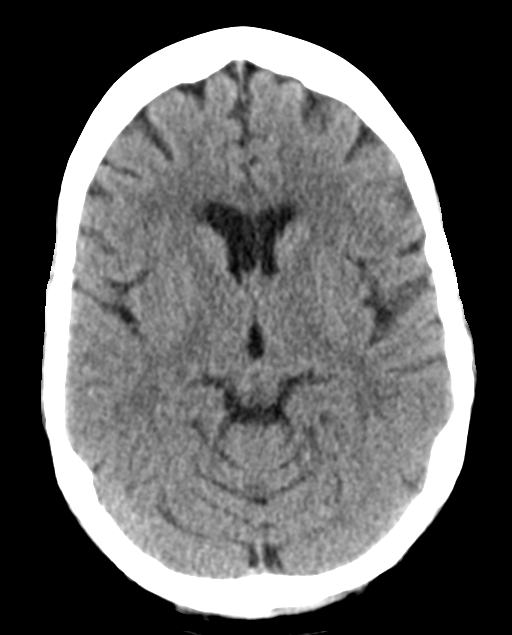
[im 18/32  brain]
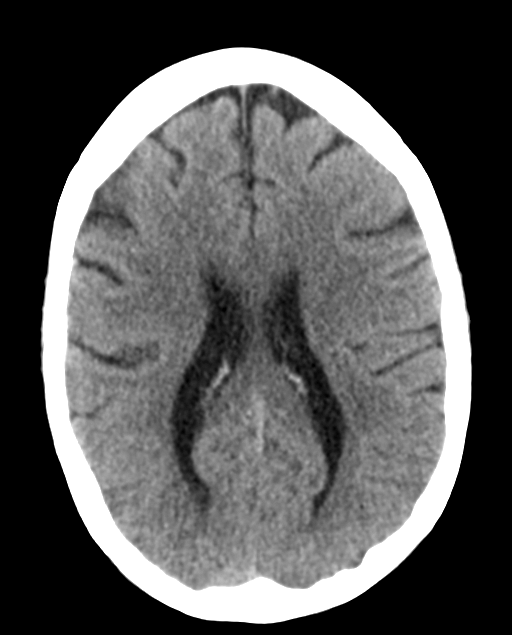
[im 18/32  bone]
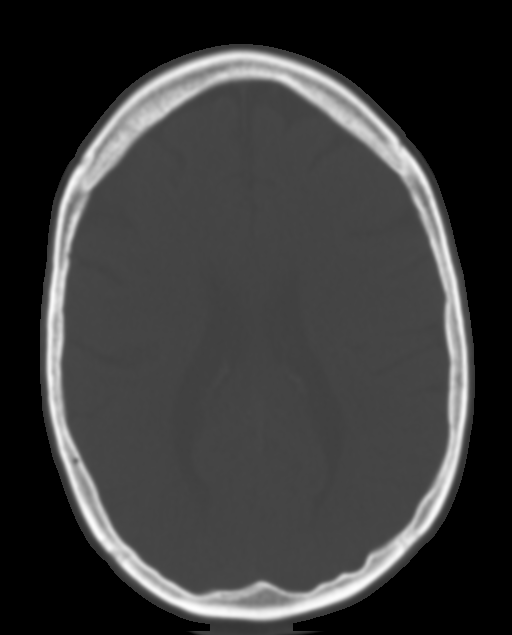
[im 20/32  brain]
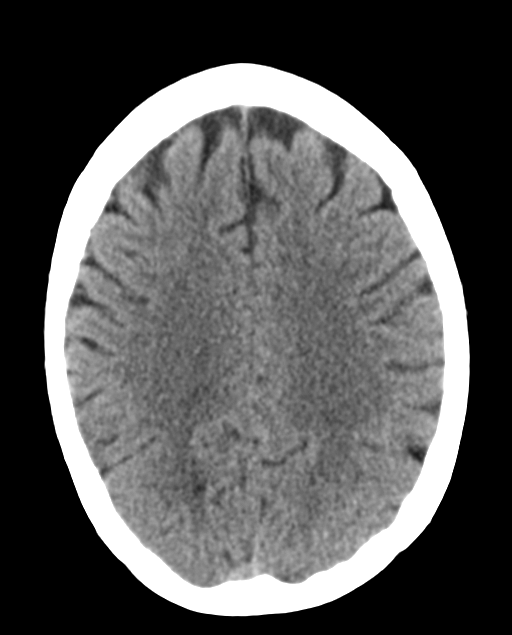
[im 25/32  brain]
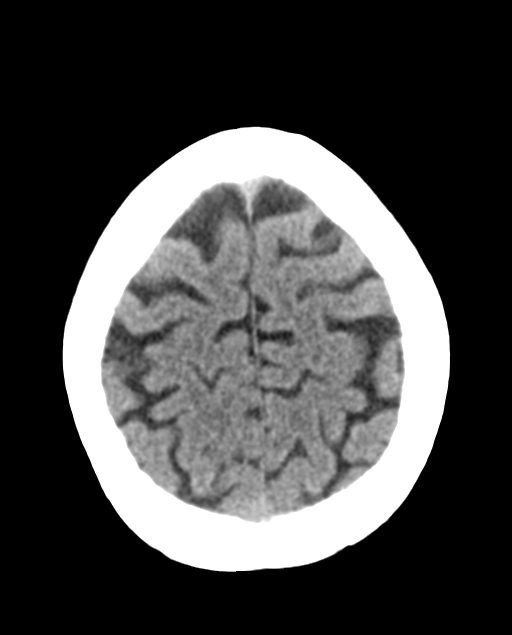
[im 29/32  brain]
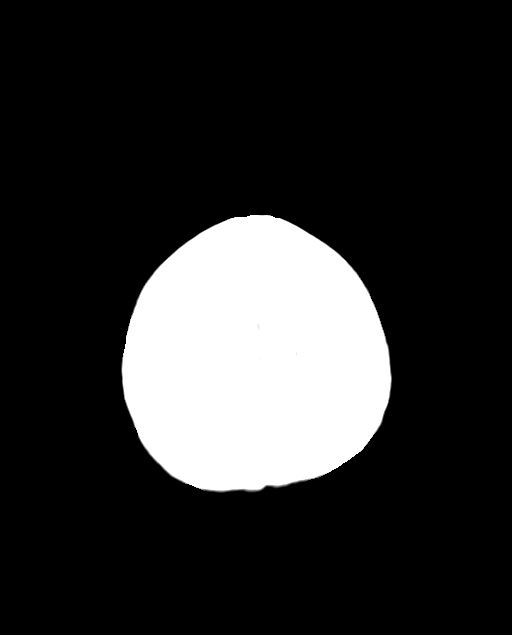

[Series 4: coronal soft tissue · coronal · 0.31mm/px · 3 of 64 slices shown]
[im 22/64  brain]
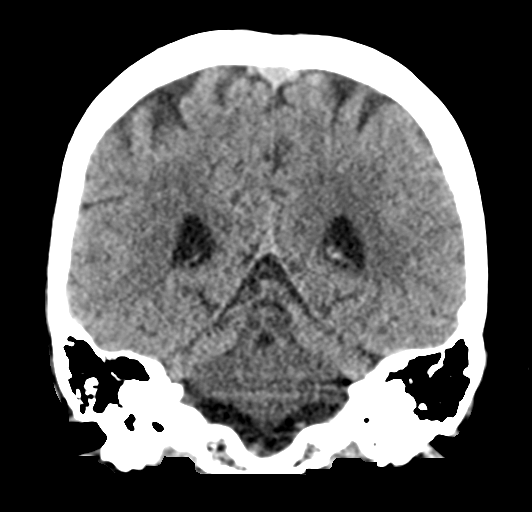
[im 29/64  brain]
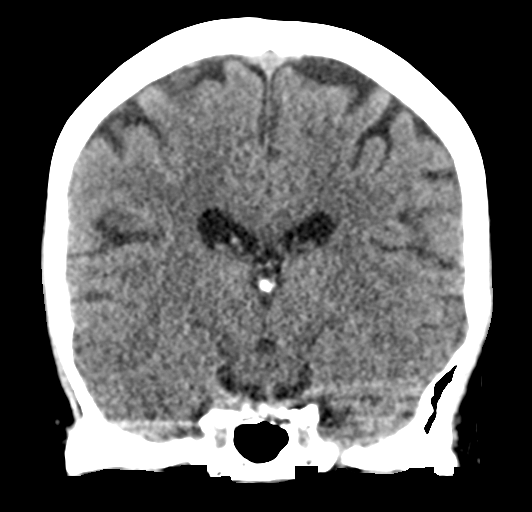
[im 36/64  brain]
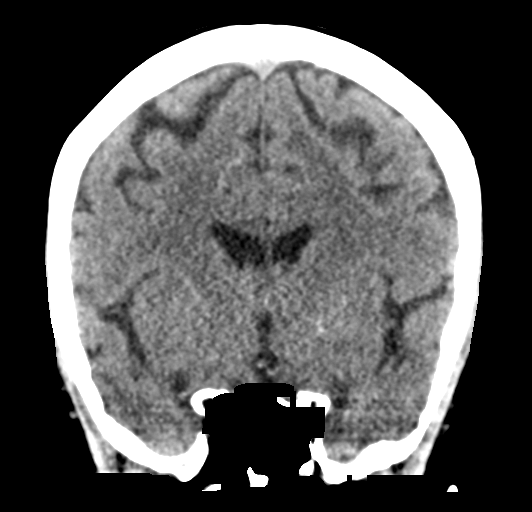

[Series 5: sagittal soft tissue · sagittal · 0.33mm/px · 3 of 50 slices shown]
[im 17/50  brain]
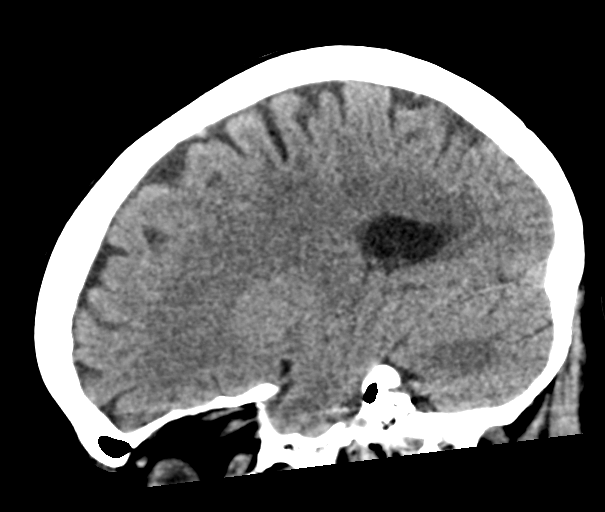
[im 25/50  brain]
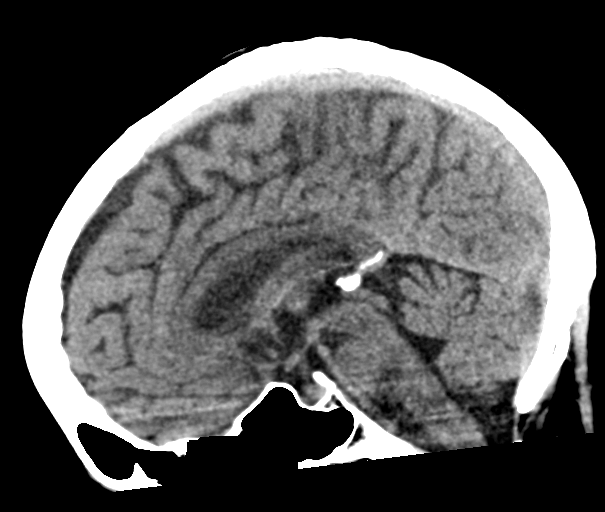
[im 33/50  brain]
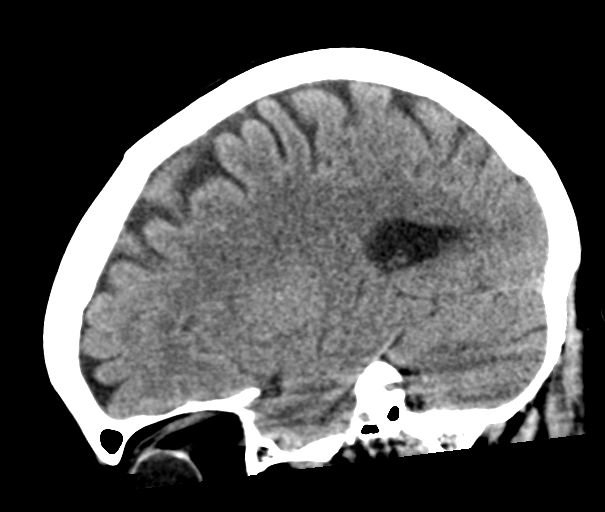

[14 of 47 positions shown; findings below may reference images not displayed]

FINDINGS: Brain: No evidence of acute territorial infarction, hemorrhage,
hydrocephalus,extra-axial collection or mass lesion/mass effect.
Normal gray-white differentiation. Ventricles are normal in size and
contour.

Vascular: No hyperdense vessel or unexpected calcification.

Skull: The skull is intact. No fracture or focal lesion identified.

Sinuses/Orbits: The visualized paranasal sinuses and mastoid air
cells are clear. The orbits and globes intact.

Other: None
IMPRESSION: No acute intracranial abnormality.

## 2020-03-11 MED ORDER — METHYLPREDNISOLONE SODIUM SUCC 125 MG IJ SOLR
125.0000 mg | Freq: Once | INTRAMUSCULAR | Status: AC | PRN
  Administered 2020-03-11: 125 mg via INTRAVENOUS

## 2020-03-11 MED ORDER — ATORVASTATIN CALCIUM 20 MG PO TABS
20.0000 mg | ORAL_TABLET | Freq: Every day | ORAL | Status: DC
  Administered 2020-03-11 – 2020-03-12 (×2): 20 mg via ORAL
  Filled 2020-03-11 (×2): qty 1

## 2020-03-11 MED ORDER — SODIUM CHLORIDE 0.9 % IV SOLN
2400.0000 mg/m2 | INTRAVENOUS | Status: DC
  Filled 2020-03-11: qty 84

## 2020-03-11 MED ORDER — SODIUM CHLORIDE 0.9 % IV SOLN
150.0000 mg | Freq: Once | INTRAVENOUS | Status: AC
  Administered 2020-03-11: 150 mg via INTRAVENOUS
  Filled 2020-03-11: qty 5

## 2020-03-11 MED ORDER — SODIUM CHLORIDE 0.9% FLUSH
10.0000 mL | Freq: Once | INTRAVENOUS | Status: AC
  Administered 2020-03-11: 10 mL via INTRAVENOUS
  Filled 2020-03-11: qty 10

## 2020-03-11 MED ORDER — SODIUM CHLORIDE 0.9 % IV SOLN
10.0000 mg | Freq: Once | INTRAVENOUS | Status: AC
  Administered 2020-03-11: 10 mg via INTRAVENOUS
  Filled 2020-03-11: qty 10

## 2020-03-11 MED ORDER — SODIUM CHLORIDE 0.9 % IV SOLN
700.0000 mg | Freq: Once | INTRAVENOUS | Status: AC
  Administered 2020-03-11: 700 mg via INTRAVENOUS
  Filled 2020-03-11: qty 10

## 2020-03-11 MED ORDER — DEXTROSE 5 % IV SOLN
Freq: Once | INTRAVENOUS | Status: AC
  Filled 2020-03-11: qty 250

## 2020-03-11 MED ORDER — SODIUM CHLORIDE 0.9 % IV SOLN
150.0000 mg/m2 | Freq: Once | INTRAVENOUS | Status: AC
  Administered 2020-03-11: 260 mg via INTRAVENOUS
  Filled 2020-03-11: qty 10

## 2020-03-11 MED ORDER — DIPHENHYDRAMINE HCL 50 MG/ML IJ SOLN
25.0000 mg | Freq: Once | INTRAMUSCULAR | Status: AC
  Administered 2020-03-11: 25 mg via INTRAVENOUS

## 2020-03-11 MED ORDER — SODIUM CHLORIDE 0.9 % IV SOLN
Freq: Once | INTRAVENOUS | Status: DC | PRN
  Filled 2020-03-11: qty 250

## 2020-03-11 MED ORDER — OXALIPLATIN CHEMO INJECTION 100 MG/20ML
85.0000 mg/m2 | Freq: Once | INTRAVENOUS | Status: AC
  Administered 2020-03-11: 150 mg via INTRAVENOUS
  Filled 2020-03-11: qty 10

## 2020-03-11 MED ORDER — HEPARIN SOD (PORK) LOCK FLUSH 100 UNIT/ML IV SOLN
500.0000 [IU] | Freq: Once | INTRAVENOUS | Status: AC | PRN
  Administered 2020-03-11: 500 [IU]
  Filled 2020-03-11: qty 5

## 2020-03-11 MED ORDER — HEPARIN SOD (PORK) LOCK FLUSH 100 UNIT/ML IV SOLN
500.0000 [IU] | Freq: Once | INTRAVENOUS | Status: AC
  Filled 2020-03-11: qty 5

## 2020-03-11 MED ORDER — SODIUM CHLORIDE 0.9% FLUSH
10.0000 mL | INTRAVENOUS | Status: DC | PRN
  Filled 2020-03-11: qty 10

## 2020-03-11 MED ORDER — PALONOSETRON HCL INJECTION 0.25 MG/5ML
0.2500 mg | Freq: Once | INTRAVENOUS | Status: AC
  Administered 2020-03-11: 0.25 mg via INTRAVENOUS
  Filled 2020-03-11: qty 5

## 2020-03-11 MED ORDER — ASPIRIN 81 MG PO CHEW
324.0000 mg | CHEWABLE_TABLET | Freq: Once | ORAL | Status: AC
  Administered 2020-03-11: 324 mg via ORAL
  Filled 2020-03-11: qty 4

## 2020-03-11 MED ORDER — ATROPINE SULFATE 1 MG/ML IJ SOLN
0.5000 mg | Freq: Once | INTRAMUSCULAR | Status: AC | PRN
  Administered 2020-03-11: 0.5 mg via INTRAVENOUS
  Filled 2020-03-11: qty 1

## 2020-03-11 NOTE — Addendum Note (Signed)
Addended by: [name] on: 03/11/2020 12:12 PM   Modules accepted: Orders

## 2020-03-11 NOTE — ED Notes (Signed)
CBG 224. Delsa Sale, RN aware.

## 2020-03-11 NOTE — Progress Notes (Signed)
Hematology/Oncology follow up note [facility] Telephone:(336) [phone] Fax:(336) [phone]   Patient Care Team: Flinchum, Kelby Aline, FNP as PCP - General (Family Medicine) Flinchum, Kelby Aline, FNP (Family Medicine) [name], RN as Oncology Nurse Navigator  REFERRING PROVIDER: Sharmon Leyden*  CHIEF COMPLAINTS/REASON FOR VISIT:  Follow up for treatment of pancreatic adenocarcinoma  HISTORY OF PRESENTING ILLNESS:   Cassandra [name] is a  64 y.o.  female with PMH listed below was seen in consultation at the request of  Flinchum, Kelby Aline, F*  for evaluation of pancreatic adenocarcinoma Patient initially presented with jaundice, transaminitis, bilirubin was 9.9.  CA 19-9 was 1874.  Patient also reports unintentional weight loss. 02/08/2020 MRI abdomen and MRCP with and without contrast was done at [facility] which showed pancreatic head mass measuring up to 3 cm, with marked associated narrowing of the portal vein confluence.  SMA is preserved.  Marked intrahepatic and extrahepatic biliary duct dilatation as well as mild dilatation of the main pancreatic duct. Multiple hepatic masses highly concerning for metastatic disease.  Patient underwent EUS on 02/19/2020, which showed irregular mass identified in the pancreatic head, hypoechoic, measured 45mmx33mm, sonographic evidence concerning for invasion into the superior mesenteric artery.  There is no sign of significant abnormality in the main pancreatic duct.  Dilatation of common bile duct which measured up to 16 mm.  Region of celiac artery was visualized and showed no signs of significant abnormality.  No lymphadenopathy.  FNA showed adenocarcinoma.  02/19/2020, ERCP, malignant.  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  The stricture was treated with placement of wall flex metal stent.  Patient was seen by [facility] oncology Dr. Pia Mau and was  recommended for 3 drug regimen FOLFIRINOX.  Patient prefers to do chemotherapy locally at [facility].  Patient was referred to establish care today. She denies any pain.  Since stent placement, skin jaundice has improved.  Itchiness has also improved. Patient was accompanied by her husband today.  She has a family history of breast cancer in sister and paternal aunt, colon cancer paternal grandmother.  INTERVAL HISTORY Cassandra [name] is a 64 y.o. female who has above history reviewed by me today presents for follow up visit for management of stage IV pancreatic cancer Problems and complaints are listed below: Patient has had Mediport placed during the interval.  She has also been to chemotherapy class. She has no new complaints today.  Jaundice has improved. Appetite is fair.  Weight is stable since last visit.  No nausea vomiting diarrhea, fever or chills.  Review of Systems  Constitutional: Positive for unexpected weight change. Negative for chills, fatigue and fever.  HENT:   Negative for hearing loss and voice change.   Eyes: Negative for eye problems.  Respiratory: Negative for chest tightness and cough.   Cardiovascular: Negative for chest pain.  Gastrointestinal: Negative for abdominal distention, abdominal pain and blood in stool.  Endocrine: Negative for hot flashes.  Genitourinary: Negative for difficulty urinating and frequency.   Musculoskeletal: Negative for arthralgias.  Skin: Negative for itching and rash.  Neurological: Negative for extremity weakness.  Hematological: Negative for adenopathy.  Psychiatric/Behavioral: Negative for confusion.    MEDICAL HISTORY:  Past Medical History:  Diagnosis Date  . Cancer [facility])    pancreatic cancer  . Colon polyps   . Osteopenia after menopause 05/2017   femoral neck T score -2.0    SURGICAL HISTORY: Past  Surgical History:  Procedure Laterality Date  . COLONOSCOPY  07/2015   WNL  . COLONOSCOPY  2008/2011  . OVARIAN CYST REMOVAL   1992   dermoid-Dr Iatan  . PORTA CATH INSERTION N/A 03/07/2020   Procedure: PORTA CATH INSERTION;  Surgeon: Algernon Huxley, MD;  Location: [place] [facility];  Service: Cardiovascular;  Laterality: N/A;  . TUBAL LIGATION  1993    SOCIAL HISTORY: Social History   Socioeconomic History  . Marital status: Married    Spouse name: Not on file  . Number of children: 2  . Years of education: Not on file  . Highest education level: Not on file  Occupational History  . Occupation: Pharmacist, hospital    Comment: retired  . Occupation: Farmer  Tobacco Use  . Smoking status: Former Smoker    Packs/day: 0.50    Years: 10.00    Pack years: 5.00    Types: Cigarettes    Quit date: 1987    Years since quitting: 34.7  . Smokeless tobacco: Never Used  . Tobacco comment: Quit smoking 1987  Vaping Use  . Vaping Use: Never used  Substance and Sexual Activity  . Alcohol use: Yes    Comment: 0-2 mixed drinks a day  . Drug use: No  . Sexual activity: Not Currently    Birth control/protection: Post-menopausal  Other Topics Concern  . Not on file  Social History Narrative  . Not on file   Social Determinants of Health   Financial Resource Strain:   . Difficulty of Paying Living Expenses: Not on file  Food Insecurity:   . Worried About Charity fundraiser in the Last Year: Not on file  . Ran Out of Food in the Last Year: Not on file  Transportation Needs:   . Lack of Transportation (Medical): Not on file  . Lack of Transportation (Non-Medical): Not on file  Physical Activity:   . Days of Exercise per Week: Not on file  . Minutes of Exercise per Session: Not on file  Stress:   . Feeling of Stress : Not on file  Social Connections:   . Frequency of Communication with Friends and Family: Not on file  . Frequency of Social Gatherings with Friends and Family: Not on file  . Attends Religious Services: Not on file  . Active Member of Clubs or Organizations: Not on file  . Attends Archivist  Meetings: Not on file  . Marital Status: Not on file  Intimate Partner Violence:   . Fear of Current or Ex-Partner: Not on file  . Emotionally Abused: Not on file  . Physically Abused: Not on file  . Sexually Abused: Not on file    FAMILY HISTORY: Family History  Problem Relation Age of Onset  . Breast cancer Paternal Aunt 37  . Diabetes Mother   . Osteoporosis Mother   . Hyperlipidemia Father   . Rheumatic fever Father   . Valvular heart disease Father   . Colon cancer Paternal Grandmother   . Breast cancer Sister 110    ALLERGIES:  is allergic to penicillin g.  MEDICATIONS:  Current Outpatient Medications  Medication Sig Dispense Refill  . calcium-vitamin D (OSCAL WITH D) 500-200 MG-UNIT TABS tablet Take by mouth.    . cholestyramine (QUESTRAN) 4 g packet Take by mouth. Take 4 g by mouth 2 (two) times daily Mix dose in 60-180 mL of water, milk or juice (Patient not taking: Reported on 02/28/2020)    .  lidocaine-prilocaine (EMLA) cream Apply to affected area once 30 g 3  . loperamide (IMODIUM A-D) 2 MG tablet Take 2 at onset of diarrhea, then 1 every 2hrs until 12hr without a BM. May take 2 tab every 4hrs at bedtime. If diarrhea recurs repeat. 100 tablet 1  . Melatonin 3 MG TABS Take by mouth at bedtime as needed.    . Multiple Vitamins-Minerals (MULTIVITAMIN ADULT PO) Take by mouth.    . ondansetron (ZOFRAN) 8 MG tablet Take 1 tablet (8 mg total) by mouth 2 (two) times daily as needed. Start on day 3 after chemotherapy. 30 tablet 1  . prochlorperazine (COMPAZINE) 10 MG tablet Take 1 tablet (10 mg total) by mouth every 6 (six) hours as needed (Nausea or vomiting). 30 tablet 1   No current facility-administered medications for this visit.     PHYSICAL EXAMINATION: ECOG PERFORMANCE STATUS: 0 - Asymptomatic Vitals:   03/11/20 0904  BP: 129/88  Pulse: 64  Resp: 16  Temp: 98.7 F (37.1 C)   Filed Weights   03/11/20 0904  Weight: 145 lb 4.8 oz (65.9 kg)    Physical  Exam Constitutional:      General: She is not in acute distress. HENT:     Head: Normocephalic and atraumatic.  Eyes:     General: No scleral icterus. Cardiovascular:     Rate and Rhythm: Normal rate and regular rhythm.     Heart sounds: Normal heart sounds.  Pulmonary:     Effort: Pulmonary effort is normal. No respiratory distress.     Breath sounds: No wheezing.  Abdominal:     General: Bowel sounds are normal. There is no distension.     Palpations: Abdomen is soft.  Musculoskeletal:        General: No deformity. Normal range of motion.     Cervical back: Normal range of motion and neck supple.  Skin:    General: Skin is warm and dry.     Coloration: Skin is not jaundiced.  Neurological:     Mental Status: She is alert and oriented to person, place, and time. Mental status is at baseline.     Cranial Nerves: No cranial nerve deficit.     Coordination: Coordination normal.  Psychiatric:        Mood and Affect: Mood normal.     LABORATORY DATA:  I have reviewed the data as listed Lab Results  Component Value Date   WBC 6.3 02/29/2020   HGB 13.4 02/29/2020   HCT 37.2 02/29/2020   MCV 91.6 02/29/2020   PLT 280 02/29/2020   Recent Labs    11/12/19 0821 02/06/20 1049 02/29/20 1314  NA 141 137 138  K 4.0 4.1 4.1  CL 104 99 103  CO2 22 23 26   GLUCOSE 105* 113* 109*  BUN 14 11 12   CREATININE 0.88 0.80 0.86  CALCIUM 9.5 10.2 9.1  GFRNONAA 70 78 >60  GFRAA 80 90 >60  PROT 6.6 7.0 7.1  ALBUMIN 4.6 4.9* 3.8  AST 14 195* 170*  ALT 18 366* 283*  ALKPHOS 100 934* 374*  BILITOT 0.6 9.9* 4.4*  BILIDIR  --  8.11*  --   IBILI  --  1.79*  --    Iron/TIBC/Ferritin/ %Sat No results found for: IRON, TIBC, FERRITIN, IRONPCTSAT    RADIOGRAPHIC STUDIES: I have personally reviewed the radiological images as listed and agreed with the findings in the report. Reviewed findings of MRI abdomen MRCP done at [facility].   ASSESSMENT &  PLAN:  1. Primary pancreatic cancer  ([place])    2. Family history of cancer   3. 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 Labs are reviewed and discussed with patient. Obstructive jaundice has significantly improved after the placement of biliary stent. Counts acceptable to proceed with cycle 1 FOLFIRINOX. G-CSF prophylaxis was not approved by her insurance.  We will monitor her blood work. We discussed about antiemetics, diarrhea medication instructions.  Family history of cancer and personal history of pancreatic cancer, refer to genetic counselor. Molecular testing results are pending at this point.  # obstructive jaundice, s/p stenting.  Bilirubin has significantly improved.  Continue to monitor. #Refer to dietitian Supportive care measures are necessary for patient well-being and will be provided as necessary. We spent sufficient time to discuss many aspect of care, questions were answered to patient's satisfaction.   All questions were answered. The patient knows to call the clinic with any problems questions or concerns.  Return of visit: 1 week to for evaluation of chemotherapy tolerability and 2 weeks for evaluation prior to next cycle of treatment.    [name], MD, PhD Hematology Oncology [facility] Pager- [phone] 03/11/2020

## 2020-03-11 NOTE — Progress Notes (Signed)
1506- Patient reports difficulty talking and having trouble forming words, as well as, dizziness. Patient's speech is slurred at this time. Irinotecan and Leucovorin infusion stopped. Vital signs stable, see vital signs flow sheet. NP, Faythe Casa, notified and 0.9% Sodium Chloride infusing per treatment plan emergency medications order, see MAR.   1510- NP, Faythe Casa, at chair side to evaluate patient. Per NP verbal order: Benadryl 25 mg IV once and Solu-medrol 125 mg IV once orders entered and given, see MAR. NP is notifying MD, Dr. Tasia Catchings.  1526- Patient reports feeling a little better. She is articulating words and slurred speech is better, but not fully resolved. Patient states, "my mouth is dry and it still feels thick in my mouth."  1530- NP, Faythe Casa, back a chair side talking with patient. No further orders at this time. Vital signs stable, see vital signs flow sheet.  1536- MD, Dr. Tasia Catchings, at chair side to evaluate patient. No further orders at this time.  81- Per MD, Dr. Tasia Catchings, verbal order: hold/do not give the remaining Irinotecan and Leucovorin infusions today; hold/do not give Fluorouracil Pump today. 0.9% Sodium Chloride infusion stopped and port-a-cath de-accessed per MD order. MD is calling Emergency Department and patient is being transferred to Emergency Department for further work up at this time.

## 2020-03-11 NOTE — ED Notes (Signed)
Attending MD Mansy at bedside at this time.

## 2020-03-11 NOTE — Research (Addendum)
[employer] 2018-01 Visit:  Patient was referred for the [employer] blood collection study by Dr. Tasia Catchings, who initiated informed consent discussion. Eligibility was verified by this RN and Doreatha Martin, RN, patient was found to meet all of the eligibility and none of the ineligibility criteria. Informed consent was reviewed with the patient by this RN stressing that participation is voluntary, that she can withdraw at any time and for any reason- and who to contact if she changes her mind. Reviewed potential risks with the patient including injury from the lab draw or potential for breach of information provided to the study. The patient was also informed of precautions we will use in order to protect her PHI from being compromised. The patient is aware that she will not personally benefit from this study, but others in the future may. Informed that if she consents, we will have her study labs collected today along with the labs for her visit with Dr. Tasia Catchings and she will have 5 vials of blood collected. The patient was instructed that her participation will end after she provides consent and completes a brief personal / family history worksheet, then has her blood collected. The patient and her husband were allowed the opportunity to ask any questions they had. Patient decided to participate and provided written consent to the [employer] informed consent version 3.0, IRB approved on Apr 25, 2018 with HIPAA included. Patient was provided a copy of the signed consent form for her records along with contact information should she have any questions in the future or she chooses to withdraw her consent. After consent she was escorted to the infusion room for labs prior to her visit with Dr. Tasia Catchings. Patient was assigned kit # [id number]. Her study labs were collected in the orders specified on the kit tubes by Nira Conn, RN and the initial tube was processed by [employer]. Patient was given a $52 [employer] gift card for  which she signed as having received. Jeral Fruit, RN, BSN, OCN Date: 03/11/2020 Time: 0815 am  The lab kit and individual collection tubes were logged into the [employer] system prior to shipping the ambient vials in the shipping box provided by [employer] via [employer]. Tube number [id number], 02, 03,04 and 05 were used from the kit. The kit # [id number] was expired as was tube # 01 in the kit. The study was notified, and site was instructed to send the shipment anyway as the last 4 tubes of the kit were not expired. The kit itself and tube # 01 expired on 02/26/2020.  Jeral Fruit, RN, BSN, OCN Date: 03/11/2020 Time: 1450 pm

## 2020-03-11 NOTE — Progress Notes (Signed)
Patient denies new problems/concerns today.   °

## 2020-03-11 NOTE — ED Provider Notes (Signed)
[facility] Emergency Department Provider Note   ____________________________________________   First MD Initiated Contact with Patient 03/11/20 1921     (approximate)  I have reviewed the triage vital signs and the nursing notes.   HISTORY  Chief Complaint No chief complaint on file.  Chief complaint is slurry speech  HPI Cassandra [name] is a 64 y.o. female who reports she was getting chemo for pancreatic cancer.  She got her first infusion 1 to have lunch and was getting the second infusion when I understand she had an episode of slurred speech while speaking to her husband.  This lasted briefly and then went away.  Patient is now back to her baseline.  She has no other past history except for the pancreatic cancer and hypercholesterolemia.  She is postmenopausal.  She had no other complaints.         Past Medical History:  Diagnosis Date  . Cancer [facility])    pancreatic cancer  . Colon polyps   . Osteopenia after menopause 05/2017   femoral neck T score -2.0    Patient Active Problem List   Diagnosis Date Noted  . Encounter for antineoplastic chemotherapy 03/11/2020  . Primary pancreatic cancer  ([place]) 02/29/2020  . Family history of cancer 02/29/2020  . Goals of care, counseling/discussion 02/29/2020  . Obstructive jaundice 02/29/2020  . Plantar fasciitis, right 09/19/2019  . Varicose veins of both lower extremities with inflammation 09/19/2019  . Pure hypercholesterolemia 09/17/2018  . Osteopenia after menopause 09/11/2017  . Postmenopausal 05/12/2017    Past Surgical History:  Procedure Laterality Date  . COLONOSCOPY  07/2015   WNL  . COLONOSCOPY  2008/2011  . OVARIAN CYST REMOVAL  1992   dermoid-Dr [place]  . PORTA CATH INSERTION N/A 03/07/2020   Procedure: PORTA CATH INSERTION;  Surgeon: Algernon Huxley, MD;  Location: [facility];  Service: Cardiovascular;  Laterality: N/A;  . [place]    Prior to Admission  medications   Medication Sig Start Date End Date Taking? Authorizing Provider  calcium-vitamin D (OSCAL WITH D) 500-200 MG-UNIT TABS tablet Take by mouth.    [provider]  cholestyramine (QUESTRAN) 4 g packet Take by mouth. Take 4 g by mouth 2 (two) times daily Mix dose in 60-180 mL of water, milk or juice Patient not taking: Reported on 02/28/2020 02/15/20 02/14/21  [provider]  lidocaine-prilocaine (EMLA) cream Apply to affected area once 02/29/20   [name], MD  loperamide (IMODIUM A-D) 2 MG tablet Take 2 at onset of diarrhea, then 1 every 2hrs until 12hr without a BM. May take 2 tab every 4hrs at bedtime. If diarrhea recurs repeat. 02/29/20   [name], MD  Melatonin 3 MG TABS Take by mouth at bedtime as needed. Patient not taking: Reported on 03/11/2020    [provider]  Multiple Vitamins-Minerals (MULTIVITAMIN ADULT PO) Take by mouth.    [provider]  ondansetron (ZOFRAN) 8 MG tablet Take 1 tablet (8 mg total) by mouth 2 (two) times daily as needed. Start on day 3 after chemotherapy. 02/29/20   [name], MD  prochlorperazine (COMPAZINE) 10 MG tablet Take 1 tablet (10 mg total) by mouth every 6 (six) hours as needed (Nausea or vomiting). 02/29/20   [name], MD    Allergies Penicillin g  Family History  Problem Relation Age of Onset  . Breast cancer Paternal Aunt 38  . Diabetes Mother   . Osteoporosis Mother   .  Hyperlipidemia Father   . Rheumatic fever Father   . Valvular heart disease Father   . Colon cancer Paternal Grandmother   . Breast cancer Sister 5    Social History Social History   Tobacco Use  . Smoking status: Former Smoker    Packs/day: 0.50    Years: 10.00    Pack years: 5.00    Types: Cigarettes    Quit date: 1987    Years since quitting: 34.7  . Smokeless tobacco: Never Used  . Tobacco comment: Quit smoking 1987  Vaping Use  . Vaping Use: Never used  Substance Use Topics  . Alcohol use: Yes    Comment: 0-2 mixed  drinks a day  . Drug use: No    Review of Systems  Constitutional: No fever/chills Eyes: No visual changes. ENT: No sore throat. Cardiovascular: Denies chest pain. Respiratory: Denies shortness of breath. Gastrointestinal: No abdominal pain.  No nausea, no vomiting.  No diarrhea.  No constipation. Genitourinary: Negative for dysuria. Musculoskeletal: Negative for back pain. Skin: Negative for rash. Neurological: Negative for headaches, focal weakness   ____________________________________________   PHYSICAL EXAM:  VITAL SIGNS: ED Triage Vitals  Enc Vitals Group     BP 03/11/20 1615 139/82     Pulse Rate 03/11/20 1615 78     Resp 03/11/20 1615 20     Temp 03/11/20 1615 98.6 F (37 C)     Temp Source 03/11/20 1615 Oral     SpO2 03/11/20 1615 100 %     Weight 03/11/20 1618 145 lb 4.5 oz (65.9 kg)     Height 03/11/20 1618 5\' 7"  (1.702 m)     Head Circumference --      Peak Flow --      Pain Score 03/11/20 1616 0     Pain Loc --      Pain Edu? --      Excl. in [place]? --     Constitutional: Alert and oriented. Well appearing and in no acute distress. Eyes: Conjunctivae are normal. PER. EOMI. Head: Atraumatic. Nose: No congestion/rhinnorhea. Mouth/Throat: Mucous membranes are moist.  Oropharynx non-erythematous. Neck: No stridor.  Cardiovascular: Normal rate, regular rhythm. Grossly normal heart sounds.  Good peripheral circulation. Respiratory: Normal respiratory effort.  No retractions. Lungs CTAB. Gastrointestinal: Soft and nontender. No distention. No abdominal bruits. No CVA tenderness. Musculoskeletal: No lower extremity tenderness nor edema.   Neurologic:  Normal speech and language. No gross focal neurologic deficits are appreciated.  Cranial nerves II through XII are intact although visual fields were not checked.  Cerebellar finger-nose and rapid alternating movements in the hands are normal.  Patient does not report any numbness.  Motor strength is 5/5 throughout.    Skin:  Skin is warm, dry and intact. No rash noted. Psychiatric: Mood and affect are normal. Speech and behavior are normal.  ____________________________________________   LABS (all labs ordered are listed, but only abnormal results are displayed)  Labs Reviewed  CBC - Abnormal; Notable for the following components:      Result Value   RBC 3.59 (*)    Hemoglobin 11.7 (*)    HCT 32.6 (*)    All other components within normal limits  DIFFERENTIAL - Abnormal; Notable for the following components:   Lymphs Abs 0.5 (*)    All other components within normal limits  COMPREHENSIVE METABOLIC PANEL - Abnormal; Notable for the following components:   Glucose, Bld 242 (*)    Calcium 8.8 (*)  ALT 62 (*)    Alkaline Phosphatase 162 (*)    Total Bilirubin 2.0 (*)    All other components within normal limits  PROTIME-INR  APTT  CBG MONITORING, ED   ____________________________________________  EKG  EKG read interpreted by me shows normal sinus rhythm rate of 77 left axis some decreased R wave progression but otherwise normal. ____________________________________________  RADIOLOGY  ED MD interpretation: CT of the head read by radiology reviewed by me shows no acute changes  Official radiology report(s): CT HEAD WO CONTRAST  Result Date: 03/11/2020 CLINICAL DATA:  Slurred speech EXAM: CT HEAD WITHOUT CONTRAST TECHNIQUE: Contiguous axial images were obtained from the base of the skull through the vertex without intravenous contrast. COMPARISON:  None. FINDINGS: Brain: No evidence of acute territorial infarction, hemorrhage, hydrocephalus,extra-axial collection or mass lesion/mass effect. Normal gray-white differentiation. Ventricles are normal in size and contour. Vascular: No hyperdense vessel or unexpected calcification. Skull: The skull is intact. No fracture or focal lesion identified. Sinuses/Orbits: The visualized paranasal sinuses and mastoid air cells are clear. The orbits and  globes intact. Other: None IMPRESSION: No acute intracranial abnormality. Electronically Signed   By: Prudencio Pair M.D.   On: 03/11/2020 16:48    ____________________________________________   PROCEDURES  Procedure(s) performed (including Critical Care):  Procedures   ____________________________________________   INITIAL IMPRESSION / ASSESSMENT AND PLAN / ED COURSE  Discussed patient with Dr. Grayland Ormond on-call for oncology.  He does not believe that this was a reaction to her chemo.  It is unlikely it was reaction to her premedication either as it happened so quickly and went away so quickly.  He thinks it is more likely to be TIA.  She was not hypoglycemic today at all with the blood sugar in the 200s this morning and in the 200s afterwards as well.  We will plan on admitting the patient for TIA work-up.              ____________________________________________   FINAL CLINICAL IMPRESSION(S) / ED DIAGNOSES  Final diagnoses:  TIA (transient ischemic attack)     ED Discharge Orders    None      *Please note:  Cassandra [name] was evaluated in Emergency Department on 03/11/2020 for the symptoms described in the history of present illness. She was evaluated in the context of the global COVID-19 pandemic, which necessitated consideration that the patient might be at risk for infection with the SARS-CoV-2 virus that causes COVID-19. Institutional protocols and algorithms that pertain to the evaluation of patients at risk for COVID-19 are in a state of rapid change based on information released by regulatory bodies including the CDC and federal and state organizations. These policies and algorithms were followed during the patient's care in the ED.  Some ED evaluations and interventions may be delayed as a result of limited staffing during and the pandemic.*   Note:  This document was prepared using Dragon voice recognition software and may include unintentional dictation  errors.    Nena Polio, MD 03/11/20 [phone]

## 2020-03-11 NOTE — H&P (Addendum)
PATIENT NAME: Cassandra [name]    MR#:  [id number]  DATE OF BIRTH:  [birth year]  DATE OF ADMISSION:  03/11/2020  PRIMARY CARE PHYSICIAN: Doreen Beam, FNP   REQUESTING/REFERRING PHYSICIAN: Conni Slipper, MD  CHIEF COMPLAINT:  Slurred speech  HISTORY OF PRESENT ILLNESS:  Cassandra [name]  is a 64 y.o. female with a known history of pancreatic cancer on active chemotherapy, who presented to the emergency room with acute onset of slurred speech while having her chemotherapy today. She stated that she knew what she wanted to say but was having trouble pronouncing the words.  This lasted about 45 minutes.  During that.  She was given Benadryl and steroids per her report.  She denied any paresthesias or other focal muscle weakness.  No tinnitus or vertigo.  No urinary or stool incontinence.  No witnessed seizures.  No headache or dizziness or blurred vision.  No chest pain or dyspnea or palpitations.No nausea or vomiting or abdominal pain.  No dysuria, oliguria or hematuria or flank pain.  Upon presentation to the emergency room, heart rate was 59 with otherwise normal vital signs.  Labs revealed hyperglycemia of 214 that was 208 earlier otherwise CMP was remarkable for slightly elevated alk phos of 162 ALT of 62 and total bili of 2.  CBC showed anemia close to previous levels.  Noncontrasted head CT scan revealed no acute intracranial abnormalities.  EKG showed normal sinus rhythm with rate of 77 with poor R progression.  COVID-19 PCR is currently pending.  The patient was ordered aspirin.  She will be admitted to an observation medically monitored bed for further evaluation and management. PAST MEDICAL HISTORY:   Past Medical History:  Diagnosis Date  . Cancer [facility])    pancreatic cancer  . Colon polyps   . Osteopenia after menopause 05/2017   femoral neck T score -2.0    PAST SURGICAL HISTORY:   Past Surgical History:  Procedure Laterality Date  . COLONOSCOPY  07/2015     WNL  . COLONOSCOPY  2008/2011  . OVARIAN CYST REMOVAL  1992   dermoid-Dr [place]  . PORTA CATH INSERTION N/A 03/07/2020   Procedure: PORTA CATH INSERTION;  Surgeon: Algernon Huxley, MD;  Location: [facility];  Service: Cardiovascular;  Laterality: N/A;  . TUBAL LIGATION  1993    SOCIAL HISTORY:   Social History   Tobacco Use  . Smoking status: Former Smoker    Packs/day: 0.50    Years: 10.00    Pack years: 5.00    Types: Cigarettes    Quit date: 1987    Years since quitting: 34.7  . Smokeless tobacco: Never Used  . Tobacco comment: Quit smoking 1987  Substance Use Topics  . Alcohol use: Yes    Comment: 0-2 mixed drinks a day    FAMILY HISTORY:   Family History  Problem Relation Age of Onset  . Breast cancer Paternal Aunt 29  . Diabetes Mother   . Osteoporosis Mother   . Hyperlipidemia Father   . Rheumatic fever Father   . Valvular heart disease Father   . Colon cancer Paternal Grandmother   . Breast cancer Sister 64    DRUG ALLERGIES:   Allergies  Allergen Reactions  . Penicillin G Hives and Swelling    Rxn as a child    REVIEW OF SYSTEMS:   ROS As per history of present illness. All pertinent systems were reviewed above. Constitutional, HEENT,  cardiovascular, respiratory, GI, GU, musculoskeletal, neuro, psychiatric, endocrine, integumentary and hematologic systems were reviewed and are otherwise negative/unremarkable except for positive findings mentioned above in the HPI.   MEDICATIONS AT HOME:   Prior to Admission medications   Medication Sig Start Date End Date Taking? Authorizing Provider  Cholecalciferol 25 MCG (1000 UT) tablet Take 2,000 Units by mouth daily.   Yes [provider]  clobetasol cream (TEMOVATE) 5.28 % Apply 1 application topically at bedtime.   Yes [provider]  lidocaine-prilocaine (EMLA) cream Apply to affected area once 02/29/20  Yes [name], MD  Multiple Vitamins-Minerals (MULTIVITAMIN ADULT PO) Take by  mouth.   Yes [provider]  ondansetron (ZOFRAN) 8 MG tablet Take 1 tablet (8 mg total) by mouth 2 (two) times daily as needed. Start on day 3 after chemotherapy. 02/29/20  Yes [name], MD  prochlorperazine (COMPAZINE) 10 MG tablet Take 1 tablet (10 mg total) by mouth every 6 (six) hours as needed (Nausea or vomiting). 02/29/20  Yes [name], MD  calcium-vitamin D (OSCAL WITH D) 500-200 MG-UNIT TABS tablet Take 1 tablet by mouth daily.     [provider]  loperamide (IMODIUM A-D) 2 MG tablet Take 2 at onset of diarrhea, then 1 every 2hrs until 12hr without a BM. May take 2 tab every 4hrs at bedtime. If diarrhea recurs repeat. 02/29/20   [name], MD      VITAL SIGNS:  Blood pressure 126/78, pulse 70, temperature 98.6 F (37 C), temperature source Oral, resp. rate 16, height _0  (1.702 m), weight 65.9 kg, SpO2 99 %.  PHYSICAL EXAMINATION:  Physical Exam  GENERAL:  64 y.o.-year-old Caucasian female patient lying in the bed with no acute distress.  EYES: Pupils equal, round, reactive to light and accommodation. No scleral icterus. Extraocular muscles intact.  HEENT: Head atraumatic, normocephalic. Oropharynx and nasopharynx clear.  NECK:  Supple, no jugular venous distention. No thyroid enlargement, no tenderness.  LUNGS: Normal breath sounds bilaterally, no wheezing, rales,rhonchi or crepitation. No use of accessory muscles of respiration.  CARDIOVASCULAR: Regular rate and rhythm, S1, S2 normal. No murmurs, rubs, or gallops.  ABDOMEN: Soft, nondistended, nontender. Bowel sounds present. No organomegaly or mass.  EXTREMITIES: No pedal edema, cyanosis, or clubbing.  NEUROLOGIC: Cranial nerves II through XII are intact. Muscle strength 5/5 in all extremities. Sensation intact. Gait not checked.  PSYCHIATRIC: The patient is alert and oriented x 3.  Normal affect and good eye contact. SKIN: No obvious rash, lesion, or ulcer.   LABORATORY PANEL:   CBC Recent Labs  Lab  03/11/20 1624  WBC 6.8  HGB 11.7*  HCT 32.6*  PLT 309   ------------------------------------------------------------------------------------------------------------------  Chemistries  Recent Labs  Lab 03/11/20 1624  NA 141  K 4.0  CL 106  CO2 27  GLUCOSE 242*  BUN 8  CREATININE 0.63  CALCIUM 8.8*  AST 34  ALT 62*  ALKPHOS 162*  BILITOT 2.0*   ------------------------------------------------------------------------------------------------------------------  Cardiac Enzymes No results for input(s): TROPONINI in the last 168 hours. ------------------------------------------------------------------------------------------------------------------  RADIOLOGY:  CT HEAD WO CONTRAST  Result Date: 03/11/2020 CLINICAL DATA:  Slurred speech EXAM: CT HEAD WITHOUT CONTRAST TECHNIQUE: Contiguous axial images were obtained from the base of the skull through the vertex without intravenous contrast. COMPARISON:  None. FINDINGS: Brain: No evidence of acute territorial infarction, hemorrhage, hydrocephalus,extra-axial collection or mass lesion/mass effect. Normal gray-white differentiation. Ventricles are normal in size and contour. Vascular: No hyperdense vessel or unexpected calcification. Skull: The skull  is intact. No fracture or focal lesion identified. Sinuses/Orbits: The visualized paranasal sinuses and mastoid air cells are clear. The orbits and globes intact. Other: None IMPRESSION: No acute intracranial abnormality. Electronically Signed   By: Prudencio Pair M.D.   On: 03/11/2020 16:48      IMPRESSION AND PLAN:   1.  Transient ischemic attack with slurred speech. -The patient will be admitted to an observation medically monitored bed. -We will follow neuro checks every 4 hours for 24 hours. -We will place the patient on aspirin and statin therapy. -We will obtain a brain MRI without contrast as well as bilateral carotid Doppler and 2D echo with bubble study for further  assessment. -PT and ST consults will be obtained. -Neurology consult will be obtained. -I notified Dr. Lorrin Goodell about the patient.  2.  Hyperglycemia, possibly secondary to new onset diabetes mellitus.  Could be related to her pancreatic cancer.. -We will monitor fingerstick blood glucose measures and check hemoglobin A1c with supplemental coverage with NovoLog while she is here.  3.  Pancreatic cancer, on active chemotherapy. -Pain management will be provided.  4.  DVT prophylaxis. -Subcutaneous Lovenox.    All the records are reviewed and case discussed with ED provider. The plan of care was discussed in details with the patient (and family). I answered all questions. The patient agreed to proceed with the above mentioned plan. Further management will depend upon hospital course.   CODE STATUS: Full code  Status is: Observation  The patient remains OBS appropriate and will d/c before 2 midnights.  Dispo: The patient is from: Home              Anticipated d/c is to: Home              Anticipated d/c date is: 1 day              Patient currently is not medically stable to d/c.   TOTAL TIME TAKING CARE OF THIS PATIENT: 55 minutes.    Christel Mormon M.D on 03/11/2020 at 9:08 PM  [facility]   From 7 PM-7 AM, contact night-coverage [email or web address]  CC: Primary care physician; Doreen Beam, FNP   Note: This dictation was prepared with Dragon dictation along with smaller phrase technology. Any transcriptional typo errors that result from this process are unintentional.

## 2020-03-11 NOTE — ED Triage Notes (Signed)
Patient was at [facility] receiving first chemo treatment, 1500 noticed slurred speech. States she notified nurses who stopped treatment infusion. Patient reports having difficulty verbalizng words at that time. Patient's speech is currently not slurred, no facial droop, no unilateral weakness. Denies headache today.

## 2020-03-11 NOTE — Progress Notes (Signed)
Patient got oxaliplatin.  But had reaction during irinotecan.  Slurred speech etc, sent to ED for TIA evaluation.  No irinotecan/leucovorin or 5FU pump.

## 2020-03-11 NOTE — Telephone Encounter (Signed)
Dr. Tasia Catchings, have you talked to Dr. Mont Dutton?  If not, do you still need to talk to him?

## 2020-03-11 NOTE — Telephone Encounter (Signed)
No. I told her today

## 2020-03-12 ENCOUNTER — Encounter: Payer: Self-pay | Admitting: Family Medicine

## 2020-03-12 ENCOUNTER — Other Ambulatory Visit: Payer: Self-pay

## 2020-03-12 ENCOUNTER — Observation Stay: Payer: BC Managed Care – PPO

## 2020-03-12 ENCOUNTER — Telehealth: Payer: Self-pay | Admitting: *Deleted

## 2020-03-12 ENCOUNTER — Observation Stay (HOSPITAL_BASED_OUTPATIENT_CLINIC_OR_DEPARTMENT_OTHER)
Admit: 2020-03-12 | Discharge: 2020-03-12 | Disposition: A | Discharge status: Home / Self Care | Payer: BC Managed Care – PPO | Attending: Internal Medicine | Admitting: Internal Medicine

## 2020-03-12 DIAGNOSIS — G459 Transient cerebral ischemic attack, unspecified: Secondary | ICD-10-CM | POA: Diagnosis not present

## 2020-03-12 DIAGNOSIS — C259 Malignant neoplasm of pancreas, unspecified: Secondary | ICD-10-CM

## 2020-03-12 LAB — ECHOCARDIOGRAM COMPLETE
AR max vel: 3.22 cm2
AV Area VTI: 3.23 cm2
AV Area mean vel: 3.07 cm2
AV Mean grad: 4.5 mmHg
AV Peak grad: 7.3 mmHg
Ao pk vel: 1.36 m/s
Area-P 1/2: 2.39 cm2
Height: 67 in
S' Lateral: 2.28 cm
Weight: 2324.53 oz

## 2020-03-12 LAB — LIPID PANEL
Cholesterol: 187 mg/dL (ref 0–200)
HDL: 51 mg/dL (ref >40)
LDL Cholesterol: 121 mg/dL — ABNORMAL HIGH (ref 0–99)
Total CHOL/HDL Ratio: 3.7 RATIO
Triglycerides: 77 mg/dL (ref <150)
VLDL: 15 mg/dL (ref 0–40)

## 2020-03-12 LAB — HEMOGLOBIN A1C
Hgb A1c MFr Bld: 5.5 % (ref 4.8–5.6)
Mean Plasma Glucose: 111.15 mg/dL

## 2020-03-12 LAB — CEA: CEA: 7.1 ng/mL — ABNORMAL HIGH (ref 0.0–4.7)

## 2020-03-12 LAB — CANCER ANTIGEN 19-9: CA 19-9: 1582 U/mL — ABNORMAL HIGH (ref 0–35)

## 2020-03-12 LAB — CK: Total CK: 48 U/L (ref 38–234)

## 2020-03-12 IMAGING — CT CT ANGIO HEAD
1 of 10 series · 6 of 33 positions shown · IV contrast (APPLIED)
Comparison: Head CT yesterday.

CLINICAL DATA: Slurred speech.  Pancreatic cancer patient.

EXAM:
CT ANGIOGRAPHY HEAD AND NECK
TECHNIQUE: Multidetector CT imaging of the head and neck was performed using
the standard protocol during bolus administration of intravenous
contrast. Multiplanar CT image reconstructions and MIPs were
obtained to evaluate the vascular anatomy. Carotid stenosis
measurements (when applicable) are obtained utilizing NASCET
criteria, using the distal internal carotid diameter as the
denominator.
CONTRAST:  75mL OMNIPAQUE IOHEXOL 350 MG/ML SOLN

[Series 10: ax thin · axial · 0.48mm/px · z∈[-321,-42]mm · 6 of 398 slices shown]
[im 57/398  soft-tissue]
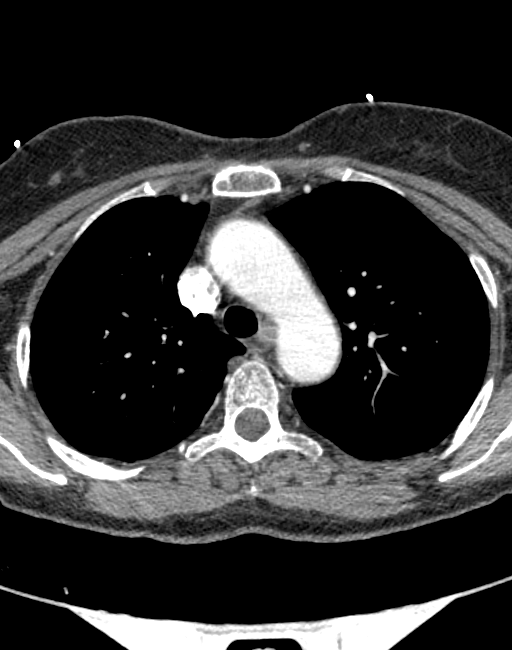
[im 114/398  bone]
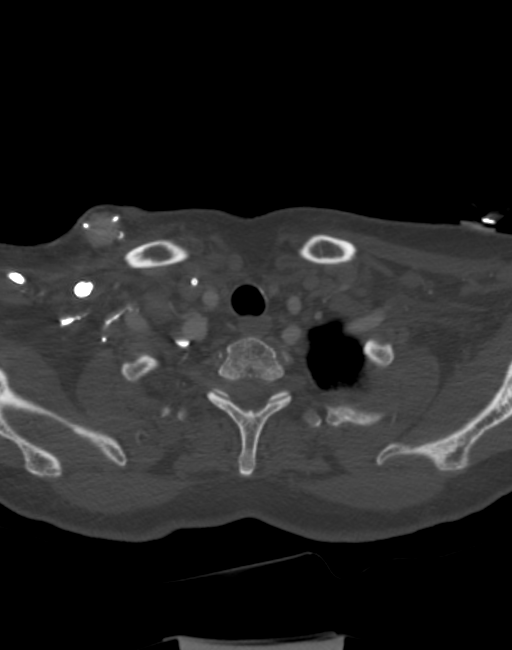
[im 171/398  soft-tissue]
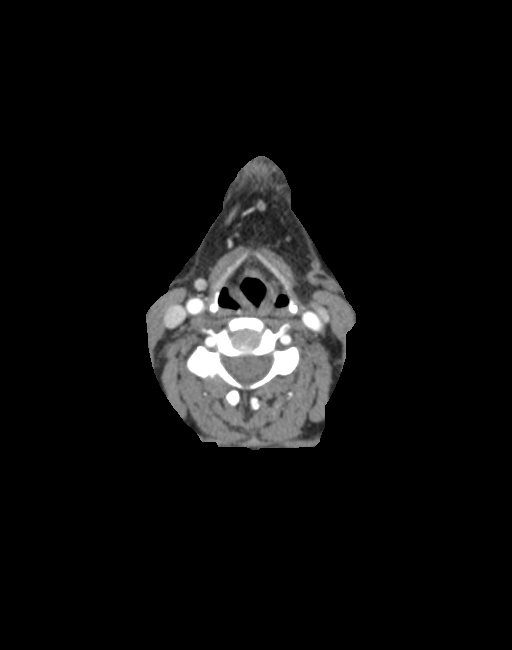
[im 227/398  bone]
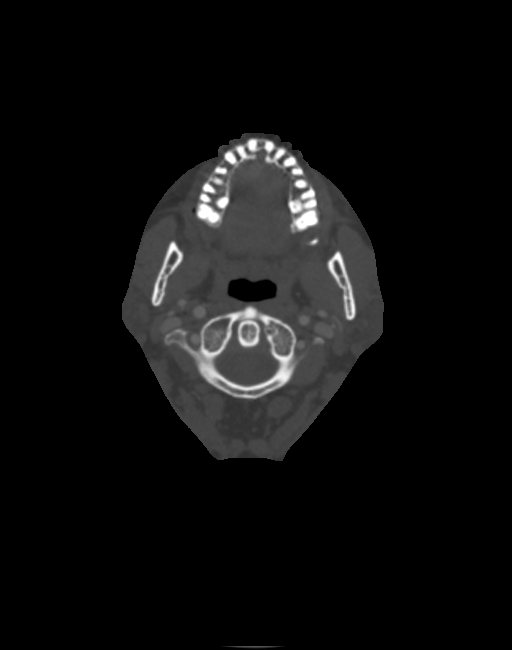
[im 284/398  soft-tissue]
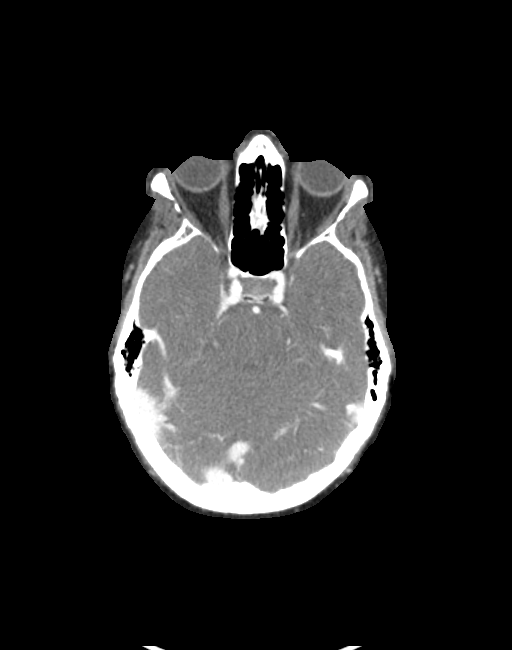
[im 341/398  bone]
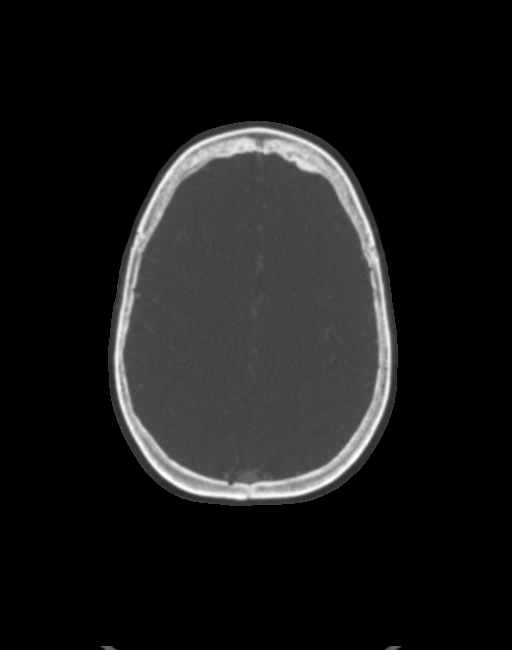

[6 of 33 positions shown; findings below may reference images not displayed]

FINDINGS: CT HEAD FINDINGS

Brain: No evidence of acute infarction. No accelerated brain
atrophy. No mass, hemorrhage, hydrocephalus or extra-axial
collection.

Vascular: There is atherosclerotic calcification of the major
vessels at the base of the brain.

Skull: Negative

Sinuses: Clear

Orbits: Normal

Review of the MIP images confirms the above findings

CTA NECK FINDINGS

Aortic arch: Aortic atherosclerosis. Branching pattern is normal
without origin stenosis.

Right carotid system: Common carotid artery widely patent to the
bifurcation. Calcified plaque at the carotid bifurcation but no
stenosis. ICA bulb widely patent. Cervical ICA is normal.

Left carotid system: Common carotid artery widely patent to the
bifurcation. Carotid bifurcation is normal without plaque or
stenosis. Cervical ICA is normal.

Vertebral arteries: Both vertebral arteries widely patent at their
origins and through the cervical region to the foramen magnum.

Skeleton: Ordinary cervical spondylosis.

Other neck: No mass or lymphadenopathy.

Upper chest: Normal

Review of the MIP images confirms the above findings

CTA HEAD FINDINGS

Anterior circulation: Both internal carotid arteries widely patent
through the skull base and siphon regions. Ordinary siphon
atherosclerotic calcification but without stenosis greater than 30%.
The anterior and middle cerebral vessels are normal without proximal
stenosis, aneurysm or vascular malformation. No large or medium
vessel occlusion.

Posterior circulation: Both vertebral arteries widely patent to the
basilar. No basilar stenosis. Posterior circulation branch vessels
are normal.

Venous sinuses: Patent and normal.

Anatomic variants: None significant.

Review of the MIP images confirms the above findings
IMPRESSION: 1. Mild atherosclerotic disease at the right carotid bifurcation but
without stenosis. Left carotid bifurcation normal.
2. No intracranial large or medium vessel occlusion or correctable
proximal stenosis.
3. Aortic atherosclerosis.

Aortic Atherosclerosis ([7J]-[7J]).

## 2020-03-12 IMAGING — MR MR HEAD W/O CM
11 series · 45 of 48 positions shown · non-contrast
Comparison: [DATE] head CT.  [DATE] CTA head and neck.

CLINICAL DATA: Transit ischemic attack.  Slurred speech.

EXAM:
MRI HEAD WITHOUT CONTRAST
TECHNIQUE: Multiplanar, multiecho pulse sequences of the brain and surrounding
structures were obtained without intravenous contrast.

[Series 5: ax dwi_tracew · axial · 3.0mm · 0.60mm/px · z∈[-92,+62]mm · 4 of 48 slices shown]
[im 1/48]
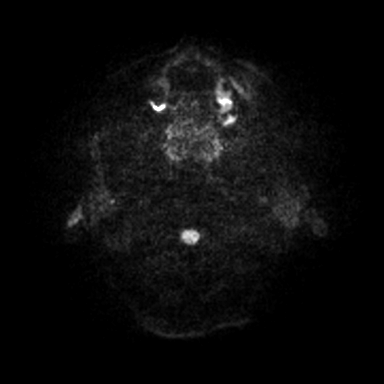
[im 16/48]
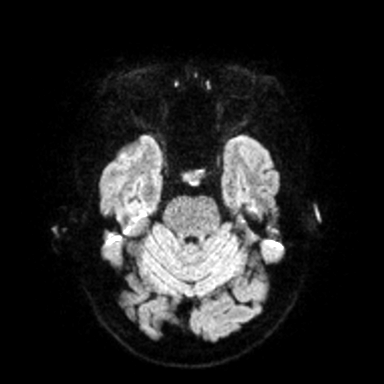
[im 32/48]
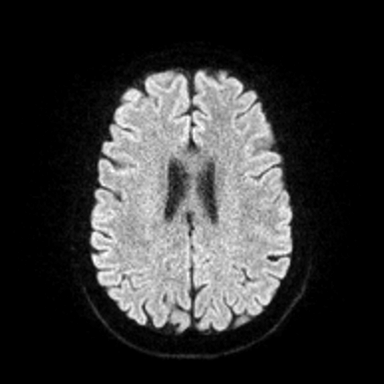
[im 48/48]
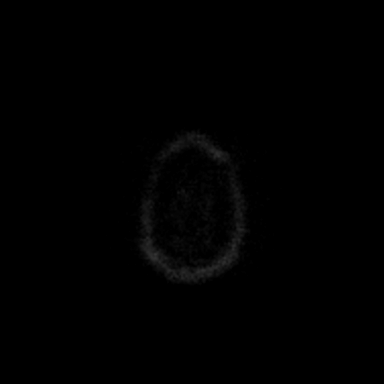

[Series 6: ax dwi_adc · axial · 3.0mm · 0.60mm/px · z∈[-92,+62]mm · 4 of 48 slices shown]
[im 1/48]
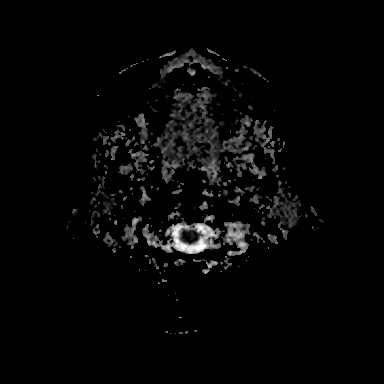
[im 16/48]
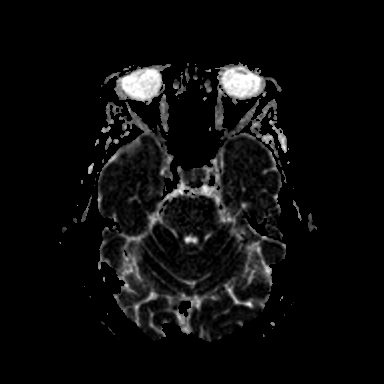
[im 32/48]
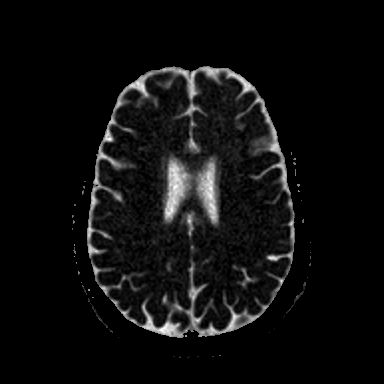
[im 48/48]
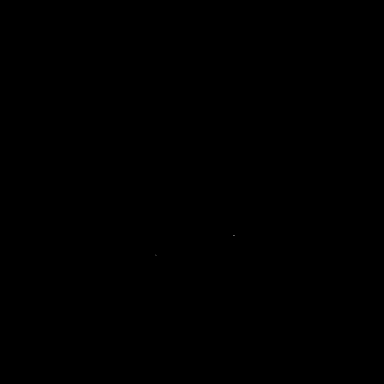

[Series 7: cor dwi_tracew · coronal · 5.0mm · 0.60mm/px · 3 of 40 slices shown]
[im 1/40]
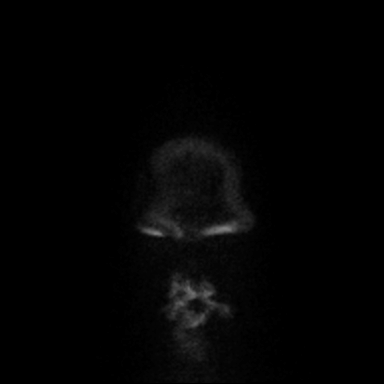
[im 20/40]
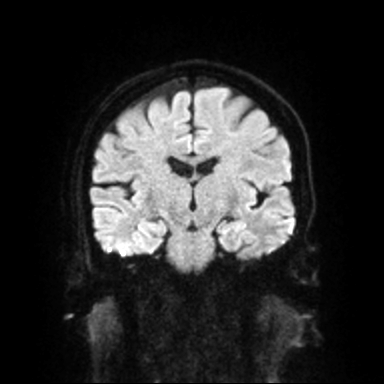
[im 40/40]
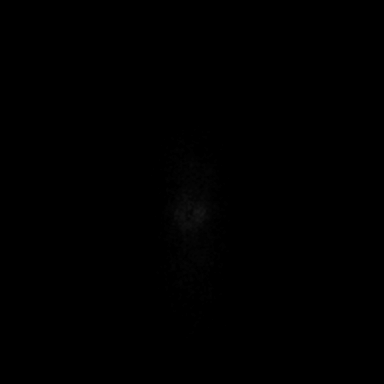

[Series 8: cor dwi_adc · coronal · 5.0mm · 0.60mm/px · 3 of 36 slices shown]
[im 1/36]
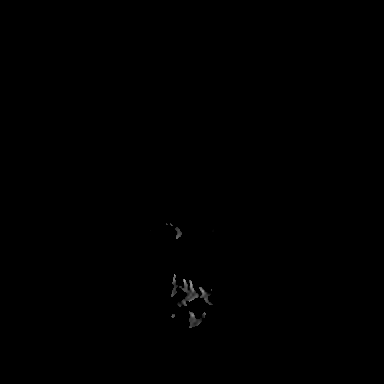
[im 18/36]
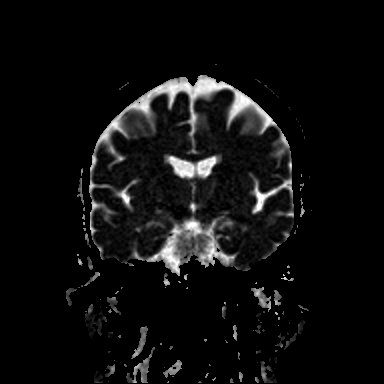
[im 36/36]
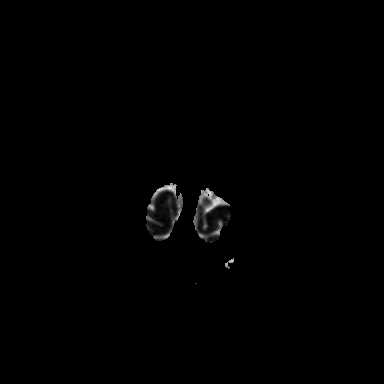

[Series 9: T1 · sagittal · 5.0mm · 0.62mm/px · 2 of 25 slices shown (1 of 2)]
[im 1/25]
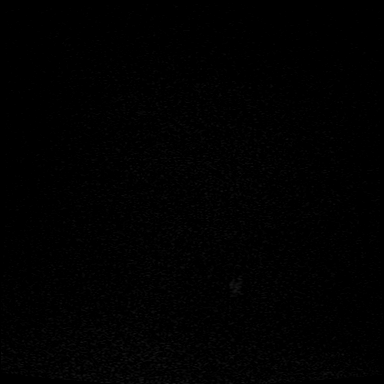
[im 25/25]
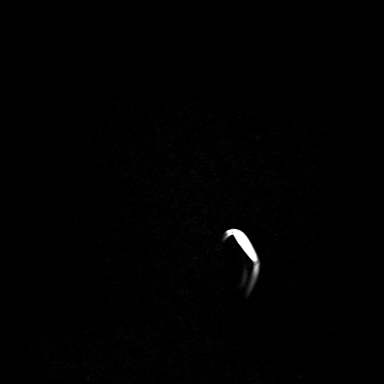

[Series 10: T2 · axial · 5.0mm · 0.53mm/px · z∈[-87,+57]mm · 2 of 25 slices shown (1 of 2)]
[im 1/25]
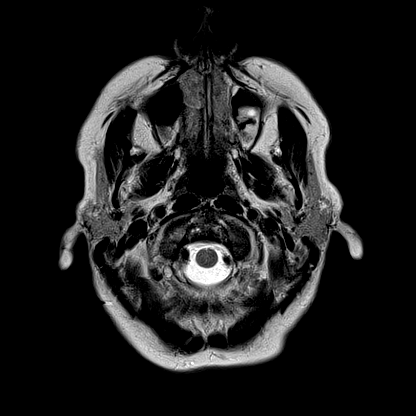
[im 25/25]
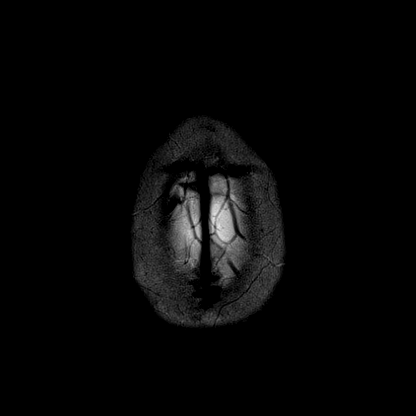

[Series 12: pha_images · axial · 3.0mm · 0.90mm/px · z∈[-104,+70]mm · 5 of 59 slices shown]
[im 1/59]
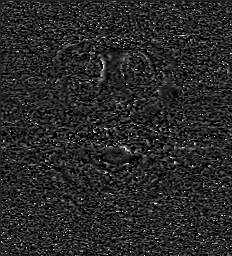
[im 15/59]
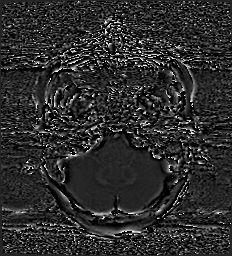
[im 30/59]
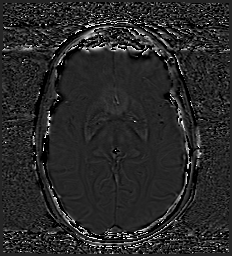
[im 44/59]
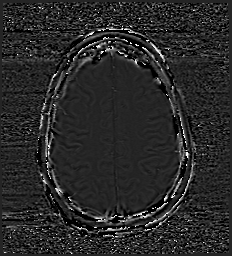
[im 59/59]
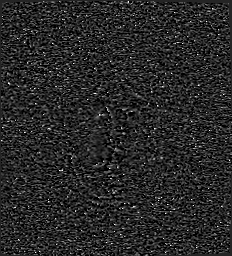

[Series 13: swi_images · axial · 3.0mm · 0.90mm/px · z∈[-104,+73]mm · 5 of 60 slices shown]
[im 1/60]
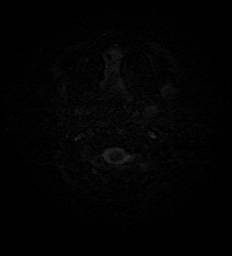
[im 15/60]
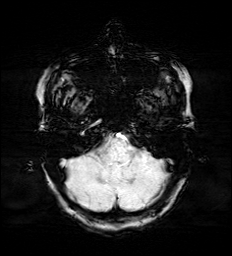
[im 30/60]
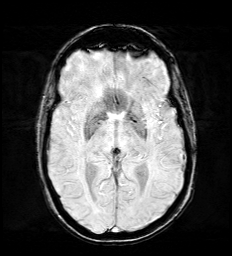
[im 45/60]
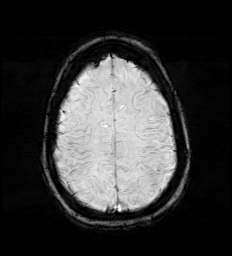
[im 60/60]
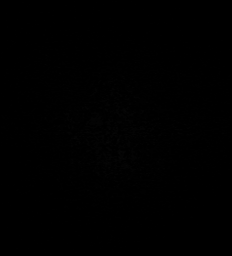

[Series 15: FLAIR · axial · 3.0mm · 0.53mm/px · z∈[-96,+66]mm · 4 of 55 slices shown]
[im 1/55]
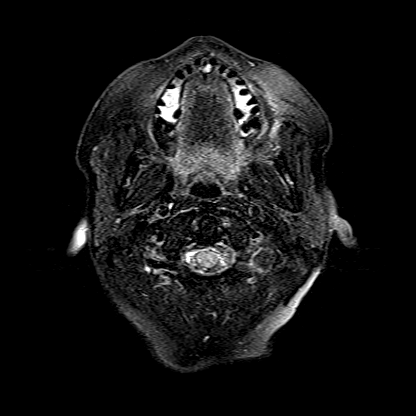
[im 19/55]
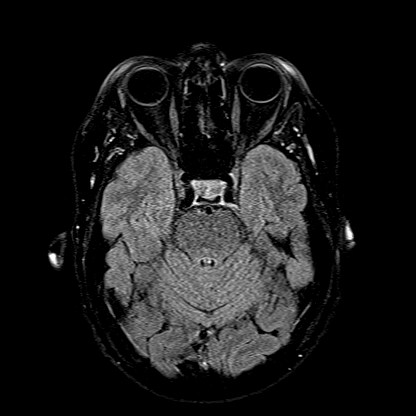
[im 37/55]
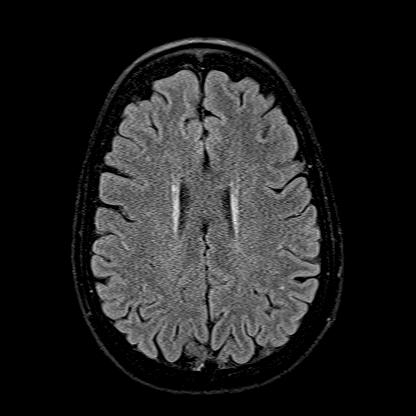
[im 55/55]
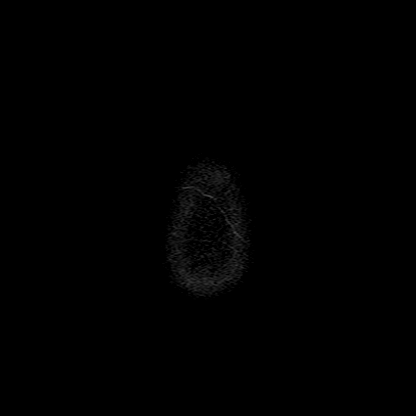

[Series 16: T1 · axial · 1.0mm · 0.98mm/px · z∈[-103,+72]mm · 11 of 176 slices shown (2 of 2)]
[im 1/176]
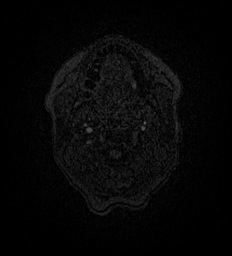
[im 14/176]
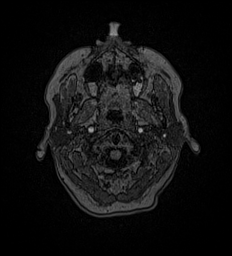
[im 27/176]
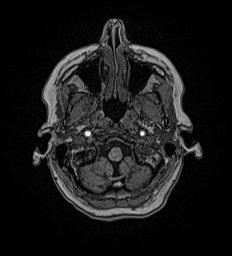
[im 41/176]
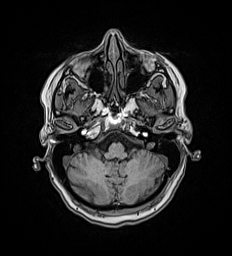
[im 54/176]
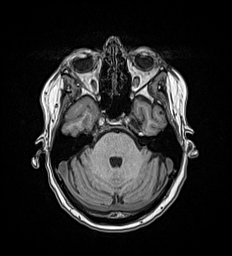
[im 68/176]
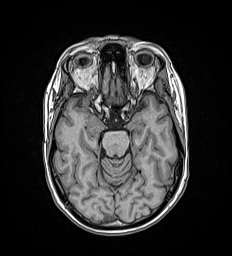
[im 81/176]
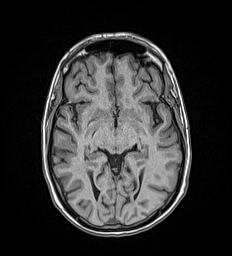
[im 95/176]
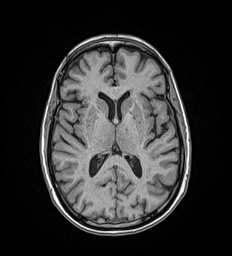
[im 122/176]
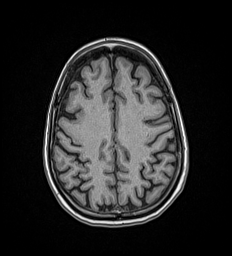
[im 149/176]
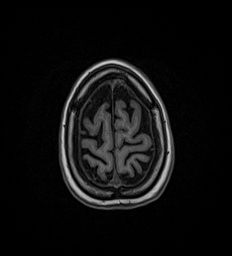
[im 176/176]
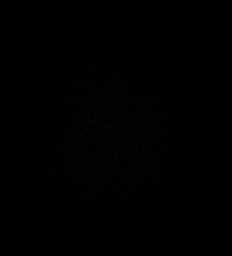

[Series 17: T2 · coronal · 5.0mm · 0.57mm/px · 2 of 29 slices shown (2 of 2)]
[im 1/29]
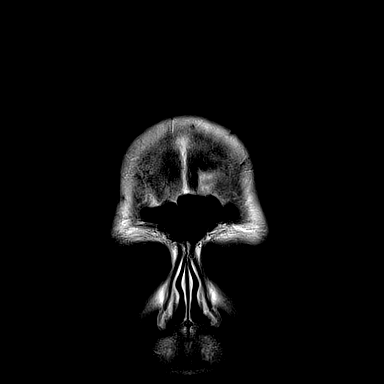
[im 29/29]
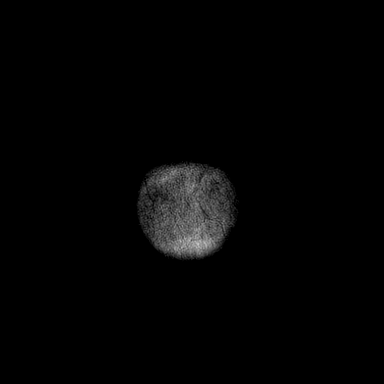

[45 of 48 positions shown; findings below may reference images not displayed]

FINDINGS: Brain: No acute infarct or intracranial hemorrhage. Cerebral volume
is within normal limits. Minimal chronic microvascular ischemic
changes. No midline shift, ventriculomegaly or extra-axial fluid
collection. No mass lesion.

Vascular: Please see same day CTA.

Skull and upper cervical spine: Normal marrow signal.

Sinuses/Orbits: Normal orbits. Clear paranasal sinuses. No mastoid
effusion.

Other: None.
IMPRESSION: No acute intracranial process.

Minimal chronic microvascular ischemic changes.

## 2020-03-12 IMAGING — US US CAROTID DUPLEX BILAT
1 series · 13 of 24 positions shown · non-contrast
Comparison: None.

CLINICAL DATA: 64-year-old female with a history of TIA

EXAM:
BILATERAL CAROTID DUPLEX ULTRASOUND
TECHNIQUE: Gray scale imaging, color Doppler and duplex ultrasound were
performed of bilateral carotid and vertebral arteries in the neck.

[Series 1: us carotid duplex bilat · 0.06mm/px · 13 of 66 slices shown]
[im 1/66]
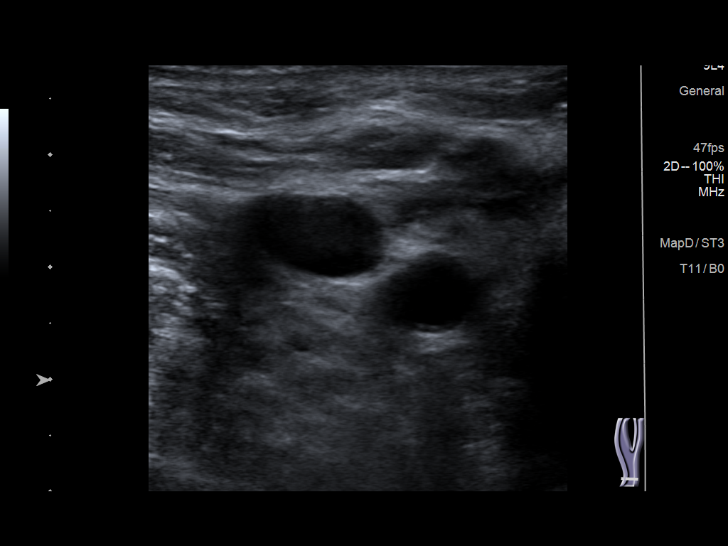
[im 6/66]
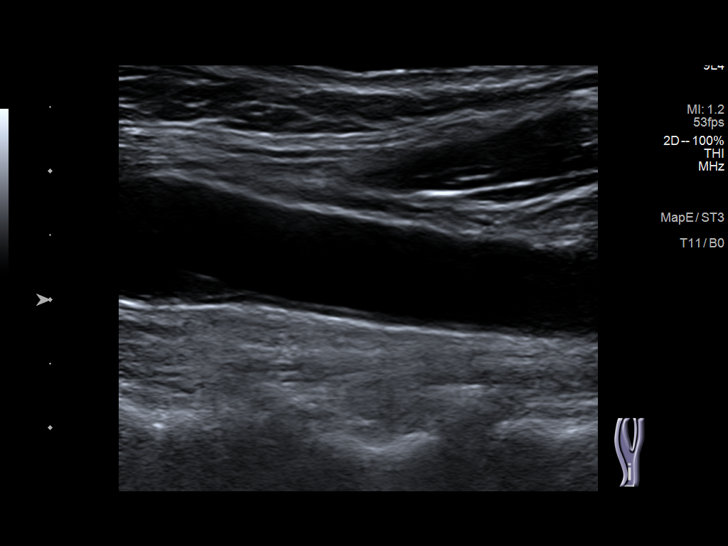
[im 12/66]
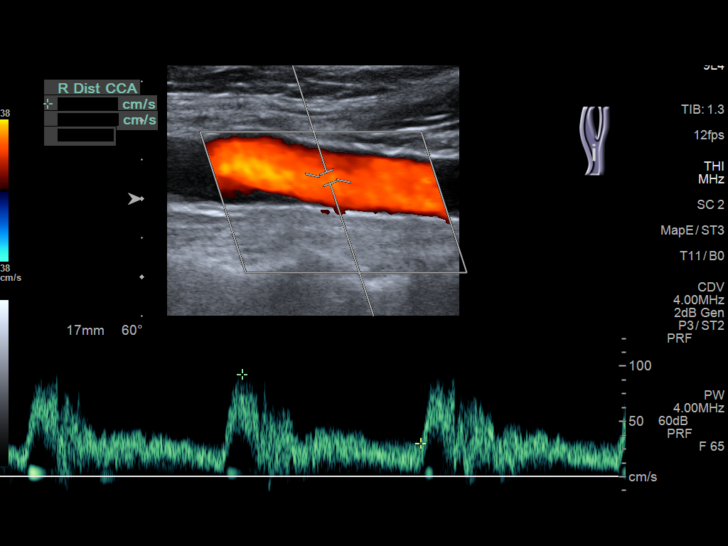
[im 17/66]
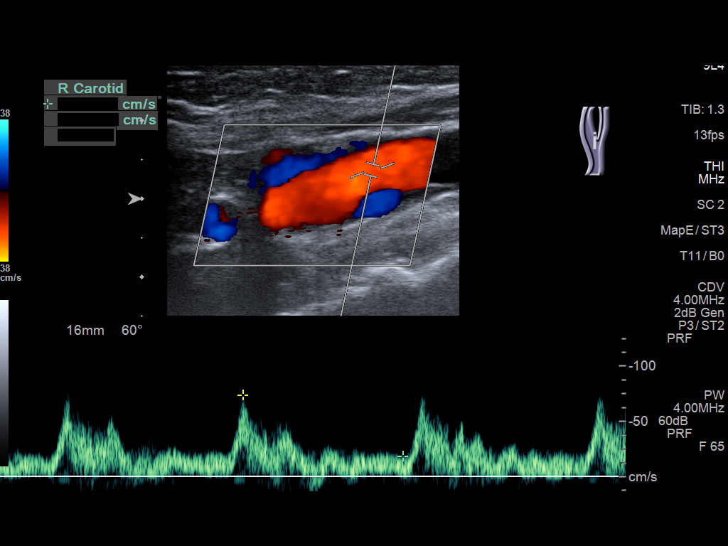
[im 23/66]
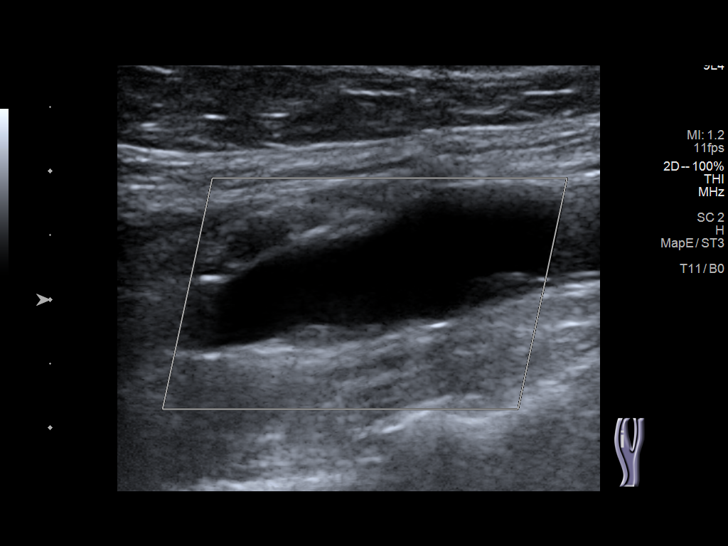
[im 29/66]
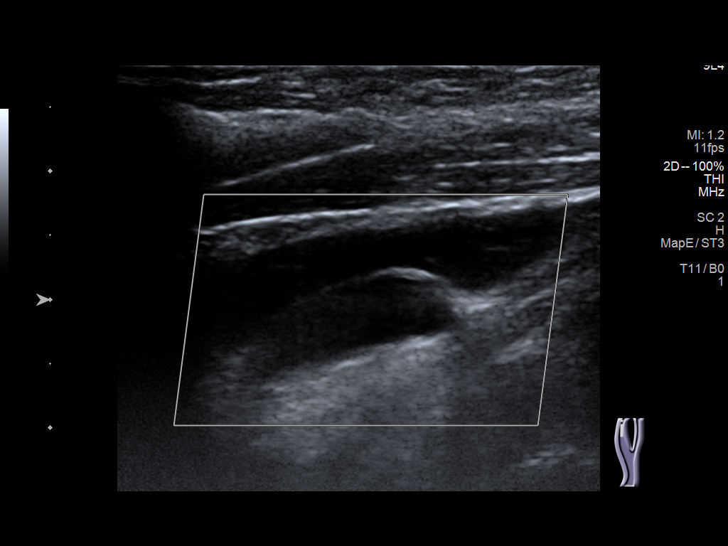
[im 34/66]
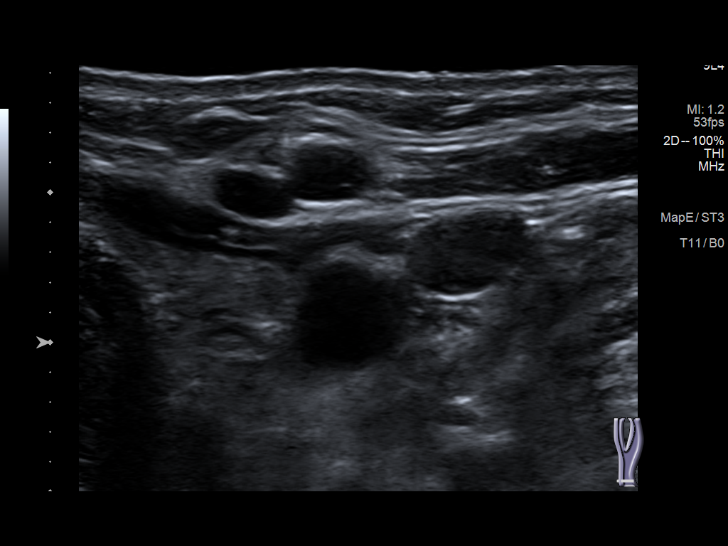
[im 37/66]
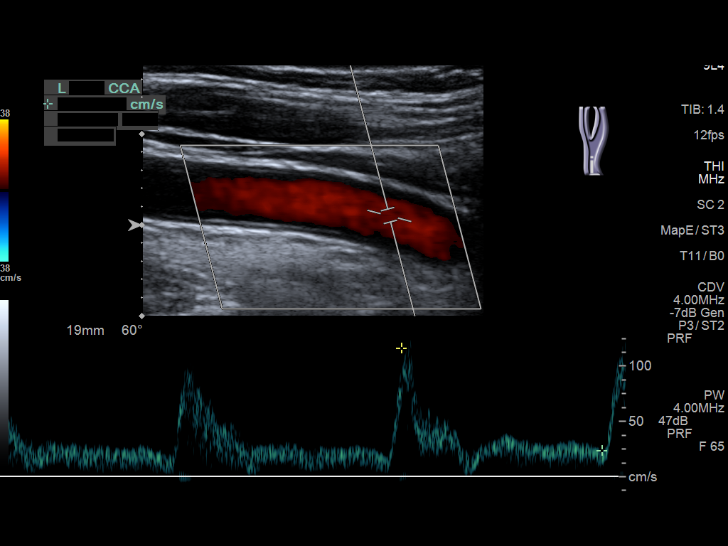
[im 43/66]
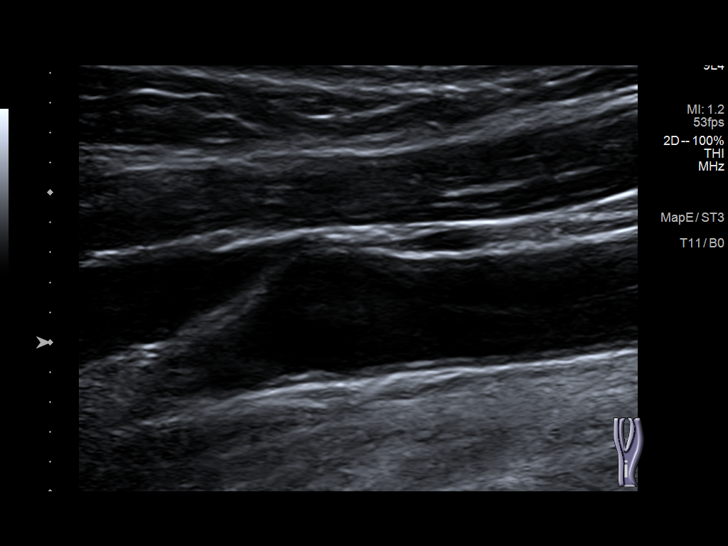
[im 49/66]
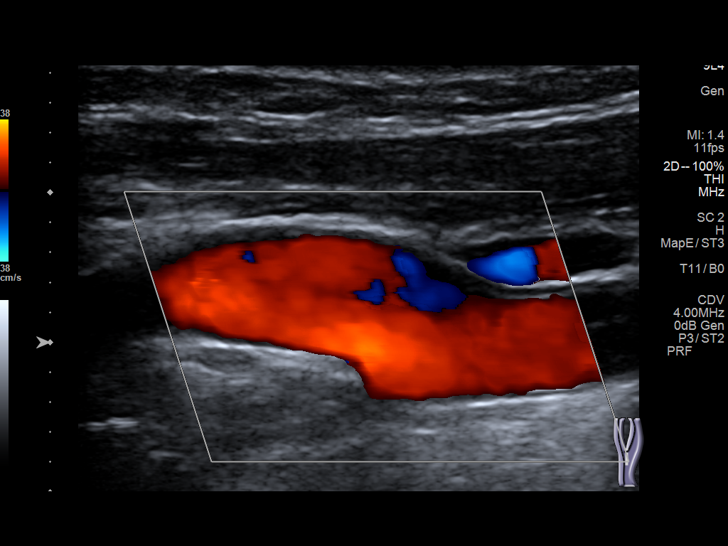
[im 54/66]
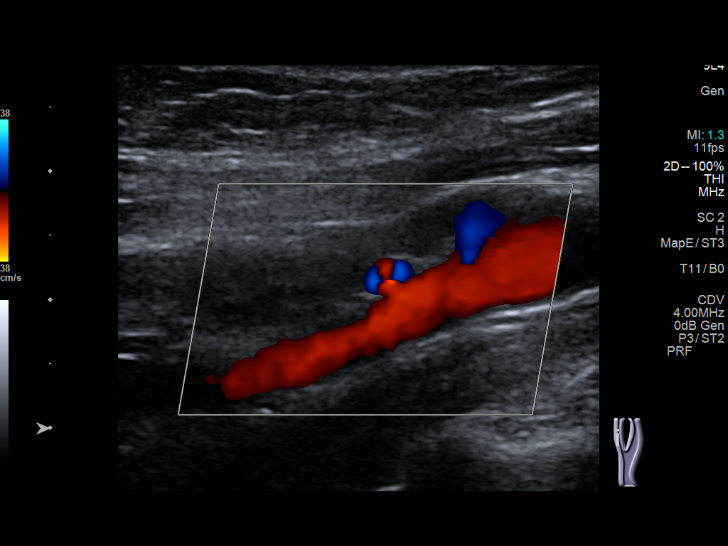
[im 60/66]
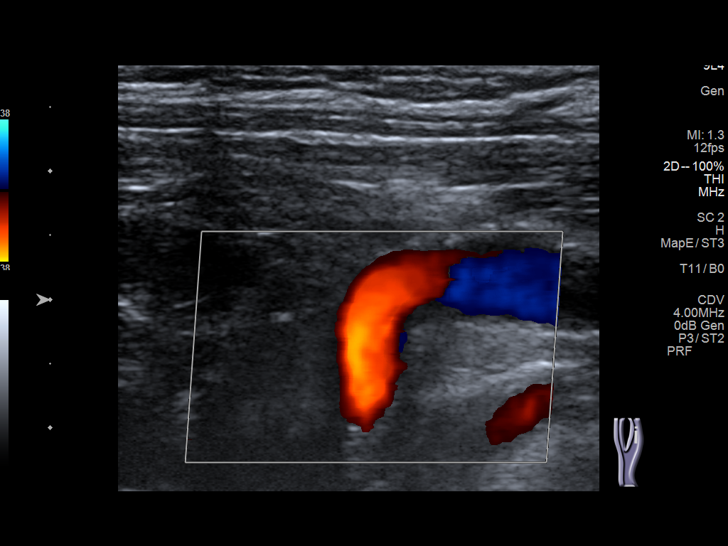
[im 66/66]
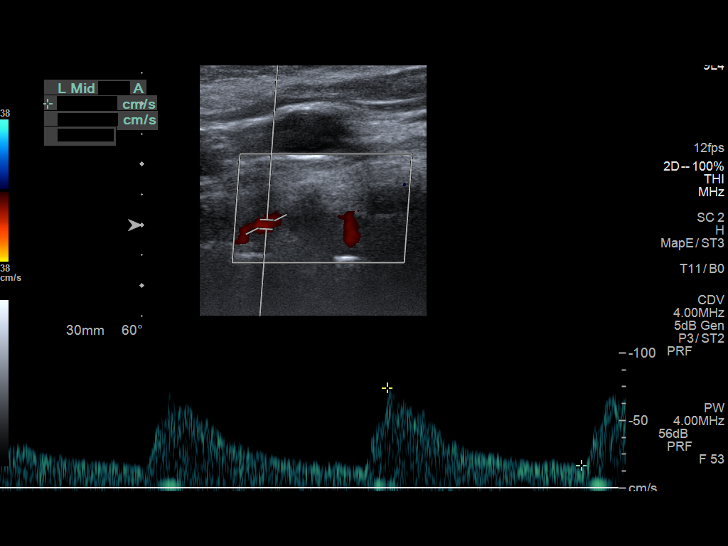

[13 of 24 positions shown; findings below may reference images not displayed]

FINDINGS: Criteria: Quantification of carotid stenosis is based on velocity
parameters that correlate the residual internal carotid diameter
with NASCET-based stenosis levels, using the diameter of the distal
internal carotid lumen as the denominator for stenosis measurement.

The following velocity measurements were obtained:

RIGHT

ICA:  Systolic 156 cm/sec, Diastolic 52 cm/sec

CCA:  98 cm/sec

SYSTOLIC ICA/CCA RATIO:

ECA:  170 cm/sec

LEFT

ICA:  Systolic 89 cm/sec, Diastolic 14 cm/sec

CCA:  95 cm/sec

SYSTOLIC ICA/CCA RATIO:

ECA:  119 cm/sec

Right Brachial SBP: Not acquired

Left Brachial SBP: Not acquired

RIGHT CAROTID ARTERY: No significant calcifications of the right
common carotid artery. Intermediate waveform maintained.
Heterogeneous and partially calcified plaque at the right carotid
bifurcation. No significant lumen shadowing. Low resistance waveform
of the right ICA. No significant tortuosity.

RIGHT VERTEBRAL ARTERY: Antegrade flow with low resistance waveform.

LEFT CAROTID ARTERY: No significant calcifications of the left
common carotid artery. Intermediate waveform maintained.
Heterogeneous and partially calcified plaque at the left carotid
bifurcation without significant lumen shadowing. Low resistance
waveform of the left ICA. No significant tortuosity.

LEFT VERTEBRAL ARTERY:  Antegrade flow with low resistance waveform.
IMPRESSION: Right:

Heterogeneous and partially calcified plaque at the right carotid
bifurcation, with discordant results regarding degree of stenosis by
established duplex criteria. Peak velocity suggests 50%-69%
stenosis, with the ICA/ CCA ratio suggesting a lesser degree of
stenosis. If establishing a more accurate degree of stenosis is
required, cerebral angiogram should be considered, or as a second
best test, CTA.

Left:

Color duplex indicates minimal heterogeneous and calcified plaque,
with no hemodynamically significant stenosis by duplex criteria in
the extracranial cerebrovascular circulation.

## 2020-03-12 MED ORDER — ATORVASTATIN CALCIUM 40 MG PO TABS: 40.0000 mg | ORAL_TABLET | Freq: Every day | ORAL | 0 refills | Status: DC

## 2020-03-12 MED ORDER — PROCHLORPERAZINE MALEATE 10 MG PO TABS
10.0000 mg | ORAL_TABLET | Freq: Four times a day (QID) | ORAL | Status: DC | PRN
  Filled 2020-03-12: qty 1

## 2020-03-12 MED ORDER — KETOROLAC TROMETHAMINE 30 MG/ML IJ SOLN
15.0000 mg | Freq: Once | INTRAMUSCULAR | Status: AC
  Administered 2020-03-12: 15 mg via INTRAVENOUS
  Filled 2020-03-12: qty 1

## 2020-03-12 MED ORDER — STROKE: EARLY STAGES OF RECOVERY BOOK: Freq: Once | Status: DC

## 2020-03-12 MED ORDER — ASPIRIN EC 81 MG PO TBEC: 81.0000 mg | DELAYED_RELEASE_TABLET | Freq: Every day | ORAL | 2 refills | Status: DC

## 2020-03-12 MED ORDER — ASPIRIN 300 MG RE SUPP: 300.0000 mg | Freq: Every day | RECTAL | Status: DC

## 2020-03-12 MED ORDER — IOHEXOL 350 MG/ML SOLN
75.0000 mL | Freq: Once | INTRAVENOUS | Status: AC | PRN
  Administered 2020-03-12: 75 mL via INTRAVENOUS
  Filled 2020-03-12: qty 75

## 2020-03-12 MED ORDER — ASPIRIN EC 325 MG PO TBEC
325.0000 mg | DELAYED_RELEASE_TABLET | Freq: Every day | ORAL | Status: DC
  Administered 2020-03-12: 325 mg via ORAL
  Filled 2020-03-12: qty 1

## 2020-03-12 NOTE — Progress Notes (Signed)
OT Cancellation Note  Patient Details Name: Cassandra [name] MRN: [id number] DOB: [birth year]   Cancelled Treatment:    Reason Eval/Treat Not Completed: OT screened, no needs identified, will sign off. Order received and chart reviewed. Spoke with pt primary physical therapist who inidicated pt has no skilled therapy needs. She is up ad lib in room, ambulating without an assistive device, and using the bathroom independently. No skilled OT needs identified. Will sign off at this time.   Shara Blazing, M.S., OTR/L Ascom: [phone] 03/12/20, 10:36 AM

## 2020-03-12 NOTE — Progress Notes (Signed)
*  PRELIMINARY RESULTS* Echocardiogram 2D Echocardiogram has been performed.  Sherrie Sport 03/12/2020, 9:29 AM

## 2020-03-12 NOTE — Evaluation (Signed)
Physical Therapy Evaluation Patient Details Name: Cassandra [name] MRN: [id number] DOB: [birth year] Today's Date: 03/12/2020   History of Present Illness  presented to ER from cancer center due to acute onset of slurred speech during initial chemo treatment; admitted for TIA/CVA work up.  Initial head CT negative for acute change; MRI pending.  Clinical Impression  Upon evaluation, patient alert and oriented; follows commands and demonstrates good effort with mobility tasks.  Speech clear and fluent; reports dysarthria fully resolved and speech back to baseline.  Bilat UE/LE strength and ROM grossly symmetrical and WFL; no focal weakness, sensory or coordination deficits appreciated.  Able to complete bed mobility, sit/stand, basic transfers and gait (150') without assist device, indep. Demonstrates reciprocal stepping pattern with good cadence, gait speed and symmetry; completes dynamic gait components without buckling, LOB or safety concern Appears to be at baseline level of functional ability; no acute PT needs identified at this time.  Will complete initial order; please re-consult should needs change.    Follow Up Recommendations No PT follow up    Equipment Recommendations       Recommendations for Other Services       Precautions / Restrictions Precautions Precautions: None      Mobility  Bed Mobility Overal bed mobility: Independent                Transfers Overall transfer level: Independent Equipment used: None                Ambulation/Gait Ambulation/Gait assistance: Independent Gait Distance (Feet): 150 Feet Assistive device: None       General Gait Details: reciprocal stepping pattern with good cadence, gait speed and symmetry; completes dynamic gait components without buckling, LOB or safety concern  Stairs            Wheelchair Mobility    Modified Rankin (Stroke Patients Only)       Balance Overall balance assessment: Modified  Independent Sitting-balance support: No upper extremity supported Sitting balance-Leahy Scale: Normal     Standing balance support: No upper extremity supported Standing balance-Leahy Scale: Good   Single Leg Stance - Right Leg: 10 Single Leg Stance - Left Leg: 10 Tandem Stance - Right Leg: 10 Tandem Stance - Left Leg: 10                     Pertinent Vitals/Pain Pain Assessment: No/denies pain    Home Living Family/patient expects to be discharged to:: Private residence Living Arrangements: Spouse/significant other Available Help at Discharge: Family Type of Home: House Home Access: Stairs to enter   Technical brewer of Steps: 1 Home Layout: Two level;Able to live on main level with bedroom/bathroom Home Equipment: None      Prior Function Level of Independence: Independent         Comments: Indep with ADLs, household and community mobilization without assist device; denies fall history.  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        Extremity/Trunk Assessment   Upper Extremity Assessment Upper Extremity Assessment: Overall WFL for tasks assessed (grossly 5/5 throughout; no focal weakness, sensory or coordination deficit noted)    Lower Extremity Assessment Lower Extremity Assessment: Overall WFL for tasks assessed (grossly 5/5 throughout; no focal weakness, sensory or coordination deficit noted)       Communication   Communication: No difficulties  Cognition Arousal/Alertness: Awake/alert Behavior During Therapy: WFL for tasks assessed/performed Overall Cognitive Status: Within Functional Limits for tasks assessed  General Comments      Exercises     Assessment/Plan    PT Assessment Patent does not need any further PT services  PT Problem List Decreased activity tolerance;Decreased balance;Decreased mobility       PT Treatment Interventions Gait training;Functional mobility  training;Therapeutic activities;Neuromuscular re-education;Patient/family education    PT Goals (Current goals can be found in the Care Plan section)  Acute Rehab PT Goals Patient Stated Goal: to get back home and continue treatments PT Goal Formulation: All assessment and education complete, DC therapy Time For Goal Achievement: 03/12/20 Potential to Achieve Goals: Good    Frequency     Barriers to discharge        Co-evaluation               AM-PAC PT "6 Clicks" Mobility  Outcome Measure Help needed turning from your back to your side while in a flat bed without using bedrails?: None Help needed moving from lying on your back to sitting on the side of a flat bed without using bedrails?: None Help needed moving to and from a bed to a chair (including a wheelchair)?: None Help needed standing up from a chair using your arms (e.g., wheelchair or bedside chair)?: None Help needed to walk in hospital room?: None Help needed climbing 3-5 steps with a railing? : None 6 Click Score: 24    End of Session Equipment Utilized During Treatment: Gait belt Activity Tolerance: Patient tolerated treatment well Patient left: in bed;with call bell/phone within reach Nurse Communication: Mobility status PT Visit Diagnosis: Other symptoms and signs involving the nervous system (R29.898)    Time: 4481-8563 PT Time Calculation (min) (ACUTE ONLY): 10 min   Charges:   PT Evaluation $PT Eval Low Complexity: 1 Low         Rhyder Koegel H. Owens Shark, PT, DPT, NCS 03/12/20, 10:39 AM [phone]

## 2020-03-12 NOTE — Discharge Summary (Signed)
Physician Discharge Summary  Cassandra [name] WTU:[id number] DOB: May 30, 1956 DOA: 03/11/2020  PCP: Doreen Beam, FNP  Admit date: 03/11/2020 Discharge date: 03/12/2020  Admitted From: Home Disposition:  Home  Recommendations for Outpatient Follow-up:  1. Follow up with PCP in 1-2 weeks 2. Outpatient referral to physical therapy  Home Health: No Equipment/Devices: None Discharge Condition: Stable CODE STATUS: Full Diet recommendation: Regular Brief/Interim Summary: Cassandra [name]  is a 64 y.o. female with a known history of pancreatic cancer on active chemotherapy, who presented to the emergency room with acute onset of slurred speech while having her chemotherapy today. She stated that she knew what she wanted to say but was having trouble pronouncing the words. This lasted about 45 minutes. During that. She was given Benadryl and steroids per her report. She denied any paresthesias or other focal muscle weakness. No tinnitus or vertigo. No urinary or stool incontinence. No witnessed seizures. No headache or dizziness or blurred vision. No chest pain or dyspnea or palpitations.No nausea or vomiting or abdominal pain. No dysuria, oliguria or hematuria or flank pain.  Upon presentation to the emergency room, heart rate was 59 with otherwise normal vital signs.  Labs revealed hyperglycemia of 214 that was 208 earlier otherwise CMP was remarkable for slightly elevated alk phos of 162 ALT of 62 and total bili of 2.  CBC showed anemia close to previous levels.  Noncontrasted head CT scan revealed no acute intracranial abnormalities.  EKG showed normal sinus rhythm with rate of 77 with poor R progression.  COVID-19 PCR is currently pending.  The patient was ordered aspirin.  She will be admitted to an observation medically monitored bed for further evaluation and management.  9/15: Neurology consult requested.  Extensive imaging survey including CTA head, CTA neck, MRI brain, carotid duplex,  echocardiogram all reassuring.  Carotid duplex and CTA did demonstrate some mild plaque formation in the internal carotid arteries.  Not felt to be hemodynamically significant.  No intervention indicated at this time.  Stable for discharge.  Will recommend aspirin 81 mg daily and Lipitor 40 mg daily.  Follow-up outpatient PCP within 1 to 2 weeks.  Outpatient referral to physical therapy sent per neurology recommendations.  Discharge Diagnoses:  Active Problems:   TIA (transient ischemic attack)  1.  Transient ischemic attack with slurred speech. Patient had an episode of slurred speech that lasted less than an hour while undergoing chemotherapy for pancreatic CA.  Resolved spontaneously.  Extensive imaging survey pursued including CT head, CTA head, CTA neck, MRI brain, carotid duplex, echocardiogram.  All within normal limits.  Some mild atherosclerotic plaque formation noted on CTA neck and carotid duplex.  Recommending aspirin 81 mg daily Lipitor 40 mg daily on discharge.  2.  Hyperglycemia, possibly secondary to new onset diabetes mellitus.  Could be related to her pancreatic cancer.. Hemoglobin A1c ordered.  Pending at time of this note.  Follow-up outpatient PCP  3.  Pancreatic cancer, on active chemotherapy. Can resume previous chemotherapeutic regimen.  Discharge Instructions  Discharge Instructions    Ambulatory referral to Physical Therapy   Complete by: As directed    Diet - low sodium heart healthy   Complete by: As directed    Increase activity slowly   Complete by: As directed      Allergies as of 03/12/2020      Reactions   Penicillin G Hives, Swelling   Rxn as a child      Medication List    TAKE these  medications   aspirin EC 81 MG tablet Take 1 tablet (81 mg total) by mouth daily. Swallow whole.   atorvastatin 40 MG tablet Commonly known as: LIPITOR Take 1 tablet (40 mg total) by mouth daily. Start taking on: March 13, 2020   calcium-vitamin D 500-200  MG-UNIT Tabs tablet Commonly known as: OSCAL WITH D Take 1 tablet by mouth daily.   Cholecalciferol 25 MCG (1000 UT) tablet Take 2,000 Units by mouth daily.   lidocaine-prilocaine cream Commonly known as: EMLA Apply to affected area once   loperamide 2 MG tablet Commonly known as: Imodium A-D Take 2 at onset of diarrhea, then 1 every 2hrs until 12hr without a BM. May take 2 tab every 4hrs at bedtime. If diarrhea recurs repeat.   MULTIVITAMIN ADULT PO Take by mouth.   ondansetron 8 MG tablet Commonly known as: Zofran Take 1 tablet (8 mg total) by mouth 2 (two) times daily as needed. Start on day 3 after chemotherapy.   prochlorperazine 10 MG tablet Commonly known as: COMPAZINE Take 1 tablet (10 mg total) by mouth every 6 (six) hours as needed (Nausea or vomiting).       Allergies  Allergen Reactions  . Penicillin G Hives and Swelling    Rxn as a child    Consultations:  Neurology   Procedures/Studies: CT ANGIO HEAD W OR WO CONTRAST  Result Date: 03/12/2020 CLINICAL DATA:  Slurred speech.  Pancreatic cancer patient. EXAM: CT ANGIOGRAPHY HEAD AND NECK TECHNIQUE: Multidetector CT imaging of the head and neck was performed using the standard protocol during bolus administration of intravenous contrast. Multiplanar CT image reconstructions and MIPs were obtained to evaluate the vascular anatomy. Carotid stenosis measurements (when applicable) are obtained utilizing NASCET criteria, using the distal internal carotid diameter as the denominator. CONTRAST:  21m OMNIPAQUE IOHEXOL 350 MG/ML SOLN COMPARISON:  Head CT yesterday. FINDINGS: CT HEAD FINDINGS Brain: No evidence of acute infarction. No accelerated brain atrophy. No mass, hemorrhage, hydrocephalus or extra-axial collection. Vascular: There is atherosclerotic calcification of the major vessels at the base of the brain. Skull: Negative Sinuses: Clear Orbits: Normal Review of the MIP images confirms the above findings CTA NECK  FINDINGS Aortic arch: Aortic atherosclerosis. Branching pattern is normal without origin stenosis. Right carotid system: Common carotid artery widely patent to the bifurcation. Calcified plaque at the carotid bifurcation but no stenosis. ICA bulb widely patent. Cervical ICA is normal. Left carotid system: Common carotid artery widely patent to the bifurcation. Carotid bifurcation is normal without plaque or stenosis. Cervical ICA is normal. Vertebral arteries: Both vertebral arteries widely patent at their origins and through the cervical region to the foramen magnum. Skeleton: Ordinary cervical spondylosis. Other neck: No mass or lymphadenopathy. Upper chest: Normal Review of the MIP images confirms the above findings CTA HEAD FINDINGS Anterior circulation: Both internal carotid arteries widely patent through the skull base and siphon regions. Ordinary siphon atherosclerotic calcification but without stenosis greater than 30%. The anterior and middle cerebral vessels are normal without proximal stenosis, aneurysm or vascular malformation. No large or medium vessel occlusion. Posterior circulation: Both vertebral arteries widely patent to the basilar. No basilar stenosis. Posterior circulation branch vessels are normal. Venous sinuses: Patent and normal. Anatomic variants: None significant. Review of the MIP images confirms the above findings IMPRESSION: 1. Mild atherosclerotic disease at the right carotid bifurcation but without stenosis. Left carotid bifurcation normal. 2. No intracranial large or medium vessel occlusion or correctable proximal stenosis. 3. Aortic atherosclerosis. Aortic Atherosclerosis (ICD10-I70.0). Electronically  Signed   By: Nelson Chimes M.D.   On: 03/12/2020 10:46   CT HEAD WO CONTRAST  Result Date: 03/11/2020 CLINICAL DATA:  Slurred speech EXAM: CT HEAD WITHOUT CONTRAST TECHNIQUE: Contiguous axial images were obtained from the base of the skull through the vertex without intravenous  contrast. COMPARISON:  None. FINDINGS: Brain: No evidence of acute territorial infarction, hemorrhage, hydrocephalus,extra-axial collection or mass lesion/mass effect. Normal gray-white differentiation. Ventricles are normal in size and contour. Vascular: No hyperdense vessel or unexpected calcification. Skull: The skull is intact. No fracture or focal lesion identified. Sinuses/Orbits: The visualized paranasal sinuses and mastoid air cells are clear. The orbits and globes intact. Other: None IMPRESSION: No acute intracranial abnormality. Electronically Signed   By: Prudencio Pair M.D.   On: 03/11/2020 16:48   CT ANGIO NECK W OR WO CONTRAST  Result Date: 03/12/2020 CLINICAL DATA:  Slurred speech.  Pancreatic cancer patient. EXAM: CT ANGIOGRAPHY HEAD AND NECK TECHNIQUE: Multidetector CT imaging of the head and neck was performed using the standard protocol during bolus administration of intravenous contrast. Multiplanar CT image reconstructions and MIPs were obtained to evaluate the vascular anatomy. Carotid stenosis measurements (when applicable) are obtained utilizing NASCET criteria, using the distal internal carotid diameter as the denominator. CONTRAST:  58m OMNIPAQUE IOHEXOL 350 MG/ML SOLN COMPARISON:  Head CT yesterday. FINDINGS: CT HEAD FINDINGS Brain: No evidence of acute infarction. No accelerated brain atrophy. No mass, hemorrhage, hydrocephalus or extra-axial collection. Vascular: There is atherosclerotic calcification of the major vessels at the base of the brain. Skull: Negative Sinuses: Clear Orbits: Normal Review of the MIP images confirms the above findings CTA NECK FINDINGS Aortic arch: Aortic atherosclerosis. Branching pattern is normal without origin stenosis. Right carotid system: Common carotid artery widely patent to the bifurcation. Calcified plaque at the carotid bifurcation but no stenosis. ICA bulb widely patent. Cervical ICA is normal. Left carotid system: Common carotid artery widely  patent to the bifurcation. Carotid bifurcation is normal without plaque or stenosis. Cervical ICA is normal. Vertebral arteries: Both vertebral arteries widely patent at their origins and through the cervical region to the foramen magnum. Skeleton: Ordinary cervical spondylosis. Other neck: No mass or lymphadenopathy. Upper chest: Normal Review of the MIP images confirms the above findings CTA HEAD FINDINGS Anterior circulation: Both internal carotid arteries widely patent through the skull base and siphon regions. Ordinary siphon atherosclerotic calcification but without stenosis greater than 30%. The anterior and middle cerebral vessels are normal without proximal stenosis, aneurysm or vascular malformation. No large or medium vessel occlusion. Posterior circulation: Both vertebral arteries widely patent to the basilar. No basilar stenosis. Posterior circulation branch vessels are normal. Venous sinuses: Patent and normal. Anatomic variants: None significant. Review of the MIP images confirms the above findings IMPRESSION: 1. Mild atherosclerotic disease at the right carotid bifurcation but without stenosis. Left carotid bifurcation normal. 2. No intracranial large or medium vessel occlusion or correctable proximal stenosis. 3. Aortic atherosclerosis. Aortic Atherosclerosis (ICD10-I70.0). Electronically Signed   By: MNelson ChimesM.D.   On: 03/12/2020 10:46   MR BRAIN WO CONTRAST  Result Date: 03/12/2020 CLINICAL DATA:  Transit ischemic attack.  Slurred speech. EXAM: MRI HEAD WITHOUT CONTRAST TECHNIQUE: Multiplanar, multiecho pulse sequences of the brain and surrounding structures were obtained without intravenous contrast. COMPARISON:  03/11/2020 head CT.  03/12/2020 CTA head and neck. FINDINGS: Brain: No acute infarct or intracranial hemorrhage. Cerebral volume is within normal limits. Minimal chronic microvascular ischemic changes. No midline shift, ventriculomegaly or extra-axial fluid collection.  No mass  lesion. Vascular: Please see same day CTA. Skull and upper cervical spine: Normal marrow signal. Sinuses/Orbits: Normal orbits. Clear paranasal sinuses. No mastoid effusion. Other: None. IMPRESSION: No acute intracranial process. Minimal chronic microvascular ischemic changes. Electronically Signed   By: Primitivo Gauze M.D.   On: 03/12/2020 11:49   US Carotid Bilateral (at [facility] and [facility] only)  Result Date: 03/12/2020 CLINICAL DATA:  64 year old female with a history of TIA EXAM: BILATERAL CAROTID DUPLEX ULTRASOUND TECHNIQUE: Pearline Cables scale imaging, color Doppler and duplex ultrasound were performed of bilateral carotid and vertebral arteries in the neck. COMPARISON:  None. FINDINGS: Criteria: Quantification of carotid stenosis is based on velocity parameters that correlate the residual internal carotid diameter with NASCET-based stenosis levels, using the diameter of the distal internal carotid lumen as the denominator for stenosis measurement. The following velocity measurements were obtained: RIGHT ICA:  Systolic 735 cm/sec, Diastolic 52 cm/sec CCA:  98 cm/sec SYSTOLIC ICA/CCA RATIO:  1.6 ECA:  170 cm/sec LEFT ICA:  Systolic 89 cm/sec, Diastolic 14 cm/sec CCA:  95 cm/sec SYSTOLIC ICA/CCA RATIO:  0.9 ECA:  119 cm/sec Right Brachial SBP: Not acquired Left Brachial SBP: Not acquired RIGHT CAROTID ARTERY: No significant calcifications of the right common carotid artery. Intermediate waveform maintained. Heterogeneous and partially calcified plaque at the right carotid bifurcation. No significant lumen shadowing. Low resistance waveform of the right ICA. No significant tortuosity. RIGHT VERTEBRAL ARTERY: Antegrade flow with low resistance waveform. LEFT CAROTID ARTERY: No significant calcifications of the left common carotid artery. Intermediate waveform maintained. Heterogeneous and partially calcified plaque at the left carotid bifurcation without significant lumen shadowing. Low resistance waveform of the left  ICA. No significant tortuosity. LEFT VERTEBRAL ARTERY:  Antegrade flow with low resistance waveform. IMPRESSION: Right: Heterogeneous and partially calcified plaque at the right carotid bifurcation, with discordant results regarding degree of stenosis by established duplex criteria. Peak velocity suggests 50%-69% stenosis, with the ICA/ CCA ratio suggesting a lesser degree of stenosis. If establishing a more accurate degree of stenosis is required, cerebral angiogram should be considered, or as a second best test, CTA. Left: Color duplex indicates minimal heterogeneous and calcified plaque, with no hemodynamically significant stenosis by duplex criteria in the extracranial cerebrovascular circulation. Signed, Dulcy Fanny. Dellia Nims, RPVI [facility] Electronically Signed   By: Corrie Mckusick D.O.   On: 03/12/2020 14:02   PERIPHERAL VASCULAR CATHETERIZATION  Result Date: 03/07/2020 See op note  ECHOCARDIOGRAM COMPLETE  Result Date: 03/12/2020    ECHOCARDIOGRAM REPORT   Patient Name:   EMMALYNN PINKHAM Date of Exam: 03/12/2020 Medical Rec #:  [id number]   Height:       67.0 in Accession #:    [id number]  Weight:       145.3 lb Date of Birth:  [birth year]   BSA:          1.765 m Patient Age:    64 years    BP:           125/74 mmHg Patient Gender: F           HR:           63 bpm. Exam Location:  [facility] Procedure: 2D Echo, Cardiac Doppler and Color Doppler Indications:     TIA 435.9  History:         Patient has no prior history of Echocardiogram examinations. No  cardiac hsitory listed.  Sonographer:     Sherrie Sport RDCS (AE) Referring Phys:  [id number] Sidney Ace Diagnosing Phys: Kate Sable MD IMPRESSIONS  1. Left ventricular ejection fraction, by estimation, is 65 to 70%. The left ventricle has normal function. The left ventricle has no regional wall motion abnormalities. Left ventricular diastolic parameters were normal.  2. Right  ventricular systolic function is normal. The right ventricular size is normal. There is normal pulmonary artery systolic pressure.  3. The mitral valve is normal in structure. No evidence of mitral valve regurgitation. No evidence of mitral stenosis.  4. The aortic valve is normal in structure. Aortic valve regurgitation is not visualized. No aortic stenosis is present.  5. The inferior vena cava is normal in size with greater than 50% respiratory variability, suggesting right atrial pressure of 3 mmHg. FINDINGS  Left Ventricle: Left ventricular ejection fraction, by estimation, is 65 to 70%. The left ventricle has normal function. The left ventricle has no regional wall motion abnormalities. The left ventricular internal cavity size was normal in size. There is  no left ventricular hypertrophy. Left ventricular diastolic parameters were normal. Right Ventricle: The right ventricular size is normal. No increase in right ventricular wall thickness. Right ventricular systolic function is normal. There is normal pulmonary artery systolic pressure. The tricuspid regurgitant velocity is 2.57 m/s, and  with an assumed right atrial pressure of 3 mmHg, the estimated right ventricular systolic pressure is 47.8 mmHg. Left Atrium: Left atrial size was normal in size. Right Atrium: Right atrial size was normal in size. Pericardium: There is no evidence of pericardial effusion. Mitral Valve: The mitral valve is normal in structure. No evidence of mitral valve regurgitation. No evidence of mitral valve stenosis. Tricuspid Valve: The tricuspid valve is normal in structure. Tricuspid valve regurgitation is not demonstrated. No evidence of tricuspid stenosis. Aortic Valve: The aortic valve is normal in structure. Aortic valve regurgitation is not visualized. No aortic stenosis is present. Aortic valve mean gradient measures 4.5 mmHg. Aortic valve peak gradient measures 7.3 mmHg. Aortic valve area, by VTI measures 3.23 cm. Pulmonic  Valve: The pulmonic valve was normal in structure. Pulmonic valve regurgitation is not visualized. No evidence of pulmonic stenosis. Aorta: The aortic root is normal in size and structure. Venous: The inferior vena cava is normal in size with greater than 50% respiratory variability, suggesting right atrial pressure of 3 mmHg. IAS/Shunts: No atrial level shunt detected by color flow Doppler.  LEFT VENTRICLE PLAX 2D LVIDd:         4.00 cm  Diastology LVIDs:         2.28 cm  LV e' medial:    7.72 cm/s LV PW:         1.50 cm  LV E/e' medial:  10.8 LV IVS:        0.90 cm  LV e' lateral:   11.50 cm/s LVOT diam:     2.00 cm  LV E/e' lateral: 7.3 LV SV:         96 LV SV Index:   54 LVOT Area:     3.14 cm  RIGHT VENTRICLE RV Basal diam:  3.77 cm RV S prime:     19.00 cm/s TAPSE (M-mode): 3.7 cm LEFT ATRIUM             Index       RIGHT ATRIUM           Index LA diam:  2.30 cm 1.30 cm/m  RA Area:     16.70 cm LA Vol (A2C):   45.2 ml 25.60 ml/m RA Volume:   45.40 ml  25.72 ml/m LA Vol (A4C):   35.1 ml 19.88 ml/m LA Biplane Vol: 39.1 ml 22.15 ml/m  AORTIC VALVE                   PULMONIC VALVE AV Area (Vmax):    3.22 cm    PV Vmax:        0.79 m/s AV Area (Vmean):   3.07 cm    PV Peak grad:   2.5 mmHg AV Area (VTI):     3.23 cm    RVOT Peak grad: 4 mmHg AV Vmax:           135.50 cm/s AV Vmean:          99.250 cm/s AV VTI:            0.297 m AV Peak Grad:      7.3 mmHg AV Mean Grad:      4.5 mmHg LVOT Vmax:         139.00 cm/s LVOT Vmean:        97.100 cm/s LVOT VTI:          0.305 m LVOT/AV VTI ratio: 1.03  AORTA Ao Root diam: 3.50 cm MITRAL VALVE               TRICUSPID VALVE MV Area (PHT): 2.39 cm    TR Peak grad:   26.4 mmHg MV Decel Time: 317 msec    TR Vmax:        257.00 cm/s MV E velocity: 83.40 cm/s MV A velocity: 72.30 cm/s  SHUNTS MV E/A ratio:  1.15        Systemic VTI:  0.30 m                            Systemic Diam: 2.00 cm Kate Sable MD Electronically signed by Kate Sable MD  Signature Date/Time: 03/12/2020/4:01:51 PM    Final     (Echo, Carotid, EGD, Colonoscopy, ERCP)    Subjective: Patient seen and examined on day of discharge.  TIA symptoms resolved.  Extensive conversation about the possible etiologies and the chance for recurrence.  All questions answered to the best my ability.  Discharge Exam: Vitals:   03/12/20 0810 03/12/20 1142  BP: 125/74 129/79  Pulse: 63 62  Resp: 15 18  Temp: 98.6 F (37 C)   SpO2: 98% 100%   Vitals:   03/12/20 0511 03/12/20 0521 03/12/20 0810 03/12/20 1142  BP: 97/68 126/76 125/74 129/79  Pulse: 66 62 63 62  Resp: _0 Temp:   98.6 F (37 C)   TempSrc:   Oral   SpO2: 99% 100% 98% 100%  Weight:      Height:        General: Pt is alert, awake, not in acute distress Cardiovascular: RRR, S1/S2 +, no rubs, no gallops Respiratory: CTA bilaterally, no wheezing, no rhonchi Abdominal: Soft, NT, ND, bowel sounds + Extremities: no edema, no cyanosis    The results of significant diagnostics from this hospitalization (including imaging, microbiology, ancillary and laboratory) are listed below for reference.     Microbiology: Recent Results (from the past 240 hour(s))  SARS CORONAVIRUS 2 (TAT 6-24 HRS) Nasopharyngeal Nasopharyngeal Swab     Status: None   Collection  Time: 03/05/20 12:12 PM   Specimen: Nasopharyngeal Swab  Result Value Ref Range Status   SARS Coronavirus 2 NEGATIVE NEGATIVE Final    Comment: (NOTE) SARS-CoV-2 target nucleic acids are NOT DETECTED.  The SARS-CoV-2 RNA is generally detectable in upper and lower respiratory specimens during the acute phase of infection. Negative results do not preclude SARS-CoV-2 infection, do not rule out co-infections with other pathogens, and should not be used as the sole basis for treatment or other patient management decisions. Negative results must be combined with clinical observations, patient history, and epidemiological information. The  expected result is Negative.  Fact Sheet for Patients: SugarRoll.be  Fact Sheet for Healthcare Providers: https://www.woods-mathews.com/  This test is not yet approved or cleared by the [place] FDA and  has been authorized for detection and/or diagnosis of SARS-CoV-2 by FDA under an Emergency Use Authorization (EUA). This EUA will remain  in effect (meaning this test can be used) for the duration of the COVID-19 declaration under Se ction 564(b)(1) of the Act, 21 U.S.C. section 360bbb-3(b)(1), unless the authorization is terminated or revoked sooner.  Performed at [facility], [place] [address], [place], [place] [address]   SARS Coronavirus 2 by RT PCR (hospital order, performed in [facility] hospital lab) Nasopharyngeal Nasopharyngeal Swab     Status: None   Collection Time: 03/11/20  8:54 PM   Specimen: Nasopharyngeal Swab  Result Value Ref Range Status   SARS Coronavirus 2 NEGATIVE NEGATIVE Final    Comment: (NOTE) SARS-CoV-2 target nucleic acids are NOT DETECTED.  The SARS-CoV-2 RNA is generally detectable in upper and lower respiratory specimens during the acute phase of infection. The lowest concentration of SARS-CoV-2 viral copies this assay can detect is 250 copies / mL. A negative result does not preclude SARS-CoV-2 infection and should not be used as the sole basis for treatment or other patient management decisions.  A negative result may occur with improper specimen collection / handling, submission of specimen other than nasopharyngeal swab, presence of viral mutation(s) within the areas targeted by this assay, and inadequate number of viral copies (<250 copies / mL). A negative result must be combined with clinical observations, patient history, and epidemiological information.  Fact Sheet for Patients:   StrictlyIdeas.no  Fact Sheet for Healthcare  Providers: BankingDealers.co.za  This test is not yet approved or  cleared by the [place] FDA and has been authorized for detection and/or diagnosis of SARS-CoV-2 by FDA under an Emergency Use Authorization (EUA).  This EUA will remain in effect (meaning this test can be used) for the duration of the COVID-19 declaration under Section 564(b)(1) of the Act, 21 U.S.C. section 360bbb-3(b)(1), unless the authorization is terminated or revoked sooner.  Performed at [facility], [address] [place], [place] [address]      Labs: BNP (last 3 results) No results for input(s): BNP in the last 8760 hours. Basic Metabolic Panel: Recent Labs  Lab 03/11/20 0826 03/11/20 1624  NA 139 141  K 4.0 4.0  CL 105 106  CO2 26 27  GLUCOSE 208* 242*  BUN 10 8  CREATININE 0.72 0.63  CALCIUM 8.5* 8.8*   Liver Function Tests: Recent Labs  Lab 03/11/20 0826 03/11/20 1624  AST 37 34  ALT 61* 62*  ALKPHOS 148* 162*  BILITOT 1.8* 2.0*  PROT 6.2* 6.7  ALBUMIN 3.2* 3.5   No results for input(s): LIPASE, AMYLASE in the last 168 hours. No results for input(s): AMMONIA in the  last 168 hours. CBC: Recent Labs  Lab 03/11/20 0826 03/11/20 1624  WBC 5.3 6.8  NEUTROABS 3.3 6.2  HGB 11.2* 11.7*  HCT 31.4* 32.6*  MCV 90.2 90.8  PLT 269 309   Cardiac Enzymes: Recent Labs  Lab 03/12/20 1340  CKTOTAL 48   BNP: Invalid input(s): POCBNP CBG: Recent Labs  Lab 03/11/20 1613  GLUCAP 224*   D-Dimer No results for input(s): DDIMER in the last 72 hours. Hgb A1c No results for input(s): HGBA1C in the last 72 hours. Lipid Profile Recent Labs    03/12/20 1340  CHOL 187  HDL 51  LDLCALC 121*  TRIG 77  CHOLHDL 3.7   Thyroid function studies No results for input(s): TSH, T4TOTAL, T3FREE, THYROIDAB in the last 72 hours.  Invalid input(s): FREET3 Anemia work up No results for input(s): VITAMINB12, FOLATE, FERRITIN, TIBC, IRON, RETICCTPCT in the  last 72 hours. Urinalysis    Component Value Date/Time   BILIRUBINUR Positive 02/06/2020 1022   PROTEINUR Negative 02/06/2020 1022   UROBILINOGEN 0.2 02/06/2020 1022   NITRITE Negative 02/06/2020 1022   LEUKOCYTESUR Negative 02/06/2020 1022   Sepsis Labs Invalid input(s): PROCALCITONIN,  WBC,  LACTICIDVEN Microbiology Recent Results (from the past 240 hour(s))  SARS CORONAVIRUS 2 (TAT 6-24 HRS) Nasopharyngeal Nasopharyngeal Swab     Status: None   Collection Time: 03/05/20 12:12 PM   Specimen: Nasopharyngeal Swab  Result Value Ref Range Status   SARS Coronavirus 2 NEGATIVE NEGATIVE Final    Comment: (NOTE) SARS-CoV-2 target nucleic acids are NOT DETECTED.  The SARS-CoV-2 RNA is generally detectable in upper and lower respiratory specimens during the acute phase of infection. Negative results do not preclude SARS-CoV-2 infection, do not rule out co-infections with other pathogens, and should not be used as the sole basis for treatment or other patient management decisions. Negative results must be combined with clinical observations, patient history, and epidemiological information. The expected result is Negative.  Fact Sheet for Patients: SugarRoll.be  Fact Sheet for Healthcare Providers: https://www.woods-mathews.com/  This test is not yet approved or cleared by the [place] FDA and  has been authorized for detection and/or diagnosis of SARS-CoV-2 by FDA under an Emergency Use Authorization (EUA). This EUA will remain  in effect (meaning this test can be used) for the duration of the COVID-19 declaration under Se ction 564(b)(1) of the Act, 21 U.S.C. section 360bbb-3(b)(1), unless the authorization is terminated or revoked sooner.  Performed at [facility], [place] [address] [place], [place] [address]   SARS Coronavirus 2 by RT PCR (hospital order, performed in [facility] hospital lab) Nasopharyngeal Nasopharyngeal  Swab     Status: None   Collection Time: 03/11/20  8:54 PM   Specimen: Nasopharyngeal Swab  Result Value Ref Range Status   SARS Coronavirus 2 NEGATIVE NEGATIVE Final    Comment: (NOTE) SARS-CoV-2 target nucleic acids are NOT DETECTED.  The SARS-CoV-2 RNA is generally detectable in upper and lower respiratory specimens during the acute phase of infection. The lowest concentration of SARS-CoV-2 viral copies this assay can detect is 250 copies / mL. A negative result does not preclude SARS-CoV-2 infection and should not be used as the sole basis for treatment or other patient management decisions.  A negative result may occur with improper specimen collection / handling, submission of specimen other than nasopharyngeal swab, presence of viral mutation(s) within the areas targeted by this assay, and inadequate number of viral copies (<250 copies / mL). A negative result must  be combined with clinical observations, patient history, and epidemiological information.  Fact Sheet for Patients:   StrictlyIdeas.no  Fact Sheet for Healthcare Providers: BankingDealers.co.za  This test is not yet approved or  cleared by the [place] FDA and has been authorized for detection and/or diagnosis of SARS-CoV-2 by FDA under an Emergency Use Authorization (EUA).  This EUA will remain in effect (meaning this test can be used) for the duration of the COVID-19 declaration under Section 564(b)(1) of the Act, 21 U.S.C. section 360bbb-3(b)(1), unless the authorization is terminated or revoked sooner.  Performed at [facility], [address], [place], [place] [address]      Time coordinating discharge: Over 30 minutes  SIGNED:   Sidney Ace, MD  [facility] 03/12/2020, 4:13 PM Pager   If 7PM-7AM, please contact night-coverage

## 2020-03-12 NOTE — Telephone Encounter (Signed)
Patient called reporting that she had gone to ER for  Mini Stroke, everything has checked out ok, she is asking how to proceed with her infusions.Please return her call

## 2020-03-12 NOTE — ED Notes (Signed)
Pt resting in bed in NAD. Introduced self to pt. Pt given pillow. Speech normal at this time.

## 2020-03-12 NOTE — Consult Note (Signed)
NEUROLOGY CONSULTATION NOTE   Date of service: March 12, 2020 Patient Name: Cassandra [name] MRN:  [id number] DOB:  February 26, 1956 Reason for consult: "slurred speech, TIA suspected"  History of Present Illness  Cassandra [name] is a 64 y.o. female with PMH significant for pancreatic cancer on active chemo who presents with an episode of slurred speech while having chemo.  Patient reports that she was sitting in a recliner for her first dose of chemotherapy when she had sudden onset slurring of her speech.  She did not have any word finding difficulties, no word salad.  She was on the phone with family when this started.  She immediately reported to the nurse that was taking care of her.  She was given Benadryl and steroids.  She did not have any tongue heaviness or any difficulty swallowing, no shortness of breath.  Symptoms lasted for about 45 minutes and then resolved.  She has no prior history of similar symptoms.  No prior history of strokes.  No arm or leg weakness or numbness.  No facial droop.  No aphasia no visual field deficit.  She does not smoke.  Occasional EtOH use.  Does not take aspirin at home.  No family history of strokes.  She endorses significant weight loss over the past year in the setting of her cancer. No leg pain.   ROS   Constitutional Denies weight loss, fever and chills.  HEENT Denies changes in vision and hearing.  Respiratory Denies SOB and cough.  CV Denies palpitations and CP  GI Denies abdominal pain, nausea, vomiting and diarrhea.  GU Denies dysuria and urinary frequency.  MSK Denies myalgia and joint pain.  Skin Denies rash and pruritus.  Neurological Denies headache and syncope.  Psychiatric Denies recent changes in mood. Denies anxiety and depression.   Past History   Past Medical History:  Diagnosis Date  . Cancer [facility])    pancreatic cancer  . Colon polyps   . Osteopenia after menopause 05/2017   femoral neck T score -2.0   Past Surgical History:   Procedure Laterality Date  . COLONOSCOPY  07/2015   WNL  . COLONOSCOPY  2008/2011  . OVARIAN CYST REMOVAL  1992   dermoid-Dr Enola  . PORTA CATH INSERTION N/A 03/07/2020   Procedure: PORTA CATH INSERTION;  Surgeon: Algernon Huxley, MD;  Location: [facility];  Service: Cardiovascular;  Laterality: N/A;  . TUBAL LIGATION  1993   Family History  Problem Relation Age of Onset  . Breast cancer Paternal Aunt 35  . Diabetes Mother   . Osteoporosis Mother   . Hyperlipidemia Father   . Rheumatic fever Father   . Valvular heart disease Father   . Colon cancer Paternal Grandmother   . Breast cancer Sister 76   Social History   Socioeconomic History  . Marital status: Married    Spouse name: Not on file  . Number of children: 2  . Years of education: Not on file  . Highest education level: Not on file  Occupational History  . Occupation: Pharmacist, hospital    Comment: retired  . Occupation: Farmer  Tobacco Use  . Smoking status: Former Smoker    Packs/day: 0.50    Years: 10.00    Pack years: 5.00    Types: Cigarettes    Quit date: 1987    Years since quitting: 34.7  . Smokeless tobacco: Never Used  . Tobacco comment: Quit smoking 1987  Vaping Use  . Vaping Use:  Never used  Substance and Sexual Activity  . Alcohol use: Yes    Comment: 0-2 mixed drinks a day  . Drug use: No  . Sexual activity: Not Currently    Birth control/protection: Post-menopausal  Other Topics Concern  . Not on file  Social History Narrative  . Not on file   Social Determinants of Health   Financial Resource Strain:   . Difficulty of Paying Living Expenses: Not on file  Food Insecurity:   . Worried About Charity fundraiser in the Last Year: Not on file  . Ran Out of Food in the Last Year: Not on file  Transportation Needs:   . Lack of Transportation (Medical): Not on file  . Lack of Transportation (Non-Medical): Not on file  Physical Activity:   . Days of Exercise per Week: Not on file  .  Minutes of Exercise per Session: Not on file  Stress:   . Feeling of Stress : Not on file  Social Connections:   . Frequency of Communication with Friends and Family: Not on file  . Frequency of Social Gatherings with Friends and Family: Not on file  . Attends Religious Services: Not on file  . Active Member of Clubs or Organizations: Not on file  . Attends Archivist Meetings: Not on file  . Marital Status: Not on file   Allergies  Allergen Reactions  . Penicillin G Hives and Swelling    Rxn as a child    Medications  (Not in a hospital admission)    Vitals  Temp:  [98.6 F (37 C)] 98.6 F (37 C) (09/15 0810) Pulse Rate:  [54-78] 63 (09/15 0810) Resp:  [12-20] 15 (09/15 0810) BP: (97-152)/(68-83) 125/74 (09/15 0810) SpO2:  [96 %-100 %] 98 % (09/15 0810) Weight:  [65.9 kg] 65.9 kg (09/14 1618)  Body mass index is 22.75 kg/m.  Physical Exam   General: Laying comfortably in bed; in no acute distress.  HENT: Normal oropharynx and mucosa. Normal external appearance of ears and nose. Neck: Supple, no pain or tenderness CV: No JVD. No peripheral edema. Pulmonary: Symmetric Chest rise. Normal respiratory effort. Abdomen: Soft to touch, non-tender Ext: No cyanosis, edema, or deformity  Skin: No rash. Normal palpation of skin.   Musculoskeletal: Normal digits and nails by inspection. No clubbing.  Neurologic Examination  Mental status/Cognition: Alert, oriented to self, place, month and year, good attention. Speech/language: Fluent, comprehension intact, object naming intact, repetition intact. Cranial nerves:   CN II Pupils equal and reactive to light, no VF deficits   CN III,IV,VI EOM intact, no gaze preference or deviation, no nystagmus   CN V normal sensation in V1, V2, and V3 segments bilaterally    CN VII no asymmetry, no nasolabial fold flattening    CN VIII normal hearing to speech    CN IX & X normal palatal elevation, no uvular deviation    CN XI 5/5  head turn and 5/5 shoulder shrug bilaterally    CN XII midline tongue protrusion    Motor:  Muscle bulk: normal, tone normal, pronator drift none Mvmt Root Nerve  Muscle Right Left Comments  SA C5/6 Ax Deltoid 5 5   EF C5/6 Mc Biceps 5 5   EE C6/7/8 Rad Triceps 5 5   WF C6/7 Med FCR 5 5   WE C7/8 PIN ECU 5 5   F Ab C8/T1 U ADM/FDI 5 5   HF L1/2/3 Fem Illopsoas 4+ 4+   KE  L2/3/4 Fem Quad 5 5   DF L4/5 D Peron Tib Ant 5 5   PF S1/2 Tibial Grc/Sol 5 5    Reflexes:  Right Left Comments  Pectoralis      Biceps (C5/6) 2 2   Brachioradialis (C5/6) 2 2    Triceps (C6/7) 2 2    Patellar (L3/4) 2 2    Achilles (S1) 2 2    Hoffman      Plantar     Jaw jerk    Sensation:  Light touch Intact throughout   Pin prick    Temperature    Vibration   Proprioception    Coordination/Complex Motor:  - Finger to Nose intact BL - Heel to shin intact BL - Rapid alternating movement intact BL - Gait: Deferred.  Labs   Lab Results  Component Value Date   NA 141 03/11/2020   K 4.0 03/11/2020   CL 106 03/11/2020   CO2 27 03/11/2020   GLUCOSE 242 (H) 03/11/2020   BUN 8 03/11/2020   CREATININE 0.63 03/11/2020   CALCIUM 8.8 (L) 03/11/2020   ALBUMIN 3.5 03/11/2020   AST 34 03/11/2020   ALT 62 (H) 03/11/2020   ALKPHOS 162 (H) 03/11/2020   BILITOT 2.0 (H) 03/11/2020   GFRNONAA >60 03/11/2020   GFRAA >60 03/11/2020     Imaging and Diagnostic studies  MRI Brain without contrast: IMPRESSION: No acute intracranial process. Minimal chronic microvascular ischemic changes.  CTA head and neck: 1. Mild atherosclerotic disease at the right carotid bifurcation but without stenosis. Left carotid bifurcation normal. 2. No intracranial large or medium vessel occlusion or correctable proximal stenosis. 3. Aortic atherosclerosis.  Impression   JISELL MAJER is a 64 y.o. female with PMH significant for  for pancreatic cancer on active chemo who presents with an episode of slurred speech while  having chemo. Episode was sudden onset and the description provided by patient seemed less concerning for a drug reaction and more concerning for a TIA. Will defer evaluation for a potential allergic reaction to chemo to the primary team and oncology. Her neurologic examination is notable for mild BL hip flexion weakness but no focal deficit, intact executive dysfunction. I think her BL hip flexion weakness is likely due to deconditioning as she has had significant weight loss, no muscle aches concerning for a myopathy. Will get a CK as a screening for potential myositis.  Recommendations  - Brain imaging- MRI Brain with no acute stroke. - Vascular imaging- CTA Brain/Neck no LVO - TTE - results pending - Lipid panel - ordered  - Statin - Continue Atorvastatin if LDL > 70 - A1C - ordered  - Antithrombotic - Continue Aspirin 81mg  daily. - DVT ppx - SQ Heparin while admitted. - SBP goal - permissive hypertension first 24 h < 220/110. Then gradual normotension - Telemetry monitoring for arrythmia- ordered - Swallow screen - Stroke education - PT/OT/SLP consult - Serum CK to evaluate for potential myopathy/mositis given mild BL hip flexion weakness. - Recommend outpatient PT for strengthing of the hip flexors.  ______________________________________________________________________   Thank you for the opportunity to take part in the care of this patient. If you have any further questions, please contact the neurology consultation attending.  Signed,  [place] Pager Number [phone]

## 2020-03-12 NOTE — Telephone Encounter (Signed)
Recieved message from Gerald Stabs ([phone]) stating that they were not able to send block but they could send unstained slides. I called [place] pathology and spoke to [facility], who said that they would need 5 unstained slided and H&E. Spoke with Gerald Stabs and confirmed that request was received and notified her about needing to send 5 slides & H&E to [facility] pathology lab. Gerald Stabs repeated address and stated that she would work on request.

## 2020-03-12 NOTE — Progress Notes (Signed)
SLP Cancellation Note  Patient Details Name: Cassandra [name] MRN: [id number] DOB: 18-Jan-1956   Cancelled treatment:       Reason Eval/Treat Not Completed: SLP screened, no needs identified, will sign off   Pt's speech has returned to baseline with no additional acute cognitive linguistic deficits identified.   Pt passed [place], will defer to MD for return to diet.   Ritisha Deitrick B. Rutherford Nail M.S., CCC-SLP, [place] Office [phone]    Cassandra [name] 03/12/2020, 8:54 AM

## 2020-03-13 ENCOUNTER — Other Ambulatory Visit: Payer: Self-pay

## 2020-03-13 ENCOUNTER — Other Ambulatory Visit: Payer: Self-pay | Admitting: Oncology

## 2020-03-13 ENCOUNTER — Ambulatory Visit
Admission: RE | Admit: 2020-03-13 | Discharge: 2020-03-13 | Disposition: A | Discharge status: Home / Self Care | Payer: BC Managed Care – PPO | Source: Ambulatory Visit | Attending: Oncology | Admitting: Oncology

## 2020-03-13 ENCOUNTER — Inpatient Hospital Stay: Payer: BC Managed Care – PPO

## 2020-03-13 ENCOUNTER — Telehealth: Payer: Self-pay | Admitting: *Deleted

## 2020-03-13 DIAGNOSIS — C259 Malignant neoplasm of pancreas, unspecified: Secondary | ICD-10-CM | POA: Diagnosis present

## 2020-03-13 LAB — POCT I-STAT CREATININE: Creatinine, Ser: 0.8 mg/dL (ref 0.44–1.00)

## 2020-03-13 IMAGING — CT CT CHEST W/ CM
2 of 3 series · 15 of 36 positions shown, 18 images · IV contrast (omnipaque)
Comparison: No priors.

CLINICAL DATA: 64-year-old female with history of pancreatic
cancer.

EXAM:
CT CHEST WITH CONTRAST
TECHNIQUE: Multidetector CT imaging of the chest was performed during
intravenous contrast administration.
CONTRAST:  75mL OMNIPAQUE IOHEXOL 300 MG/ML  SOLN

[Series 2: axial st · axial · 0.66mm/px · z∈[+94,+342]mm · 12 of 146 slices shown, 15 images]
[im 11/146  mediastinal]
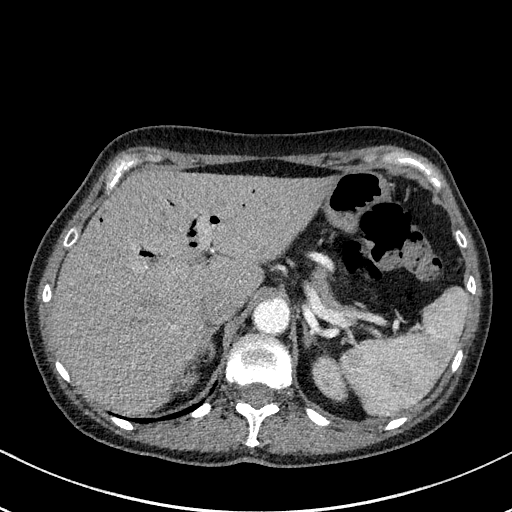
[im 11/146  lung]
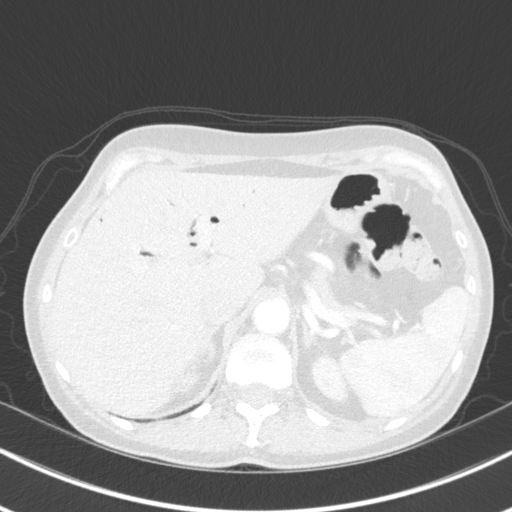
[im 22/146  lung]
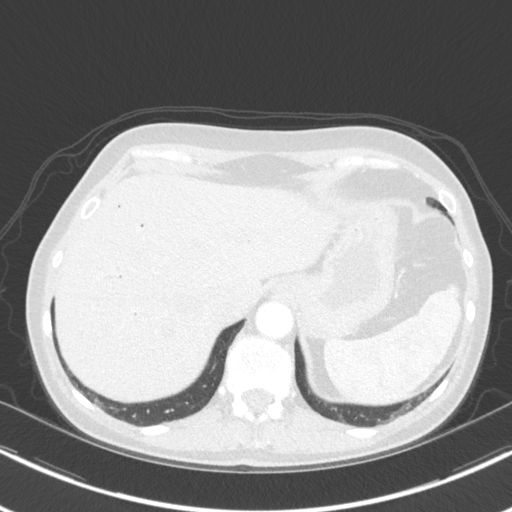
[im 33/146  lung]
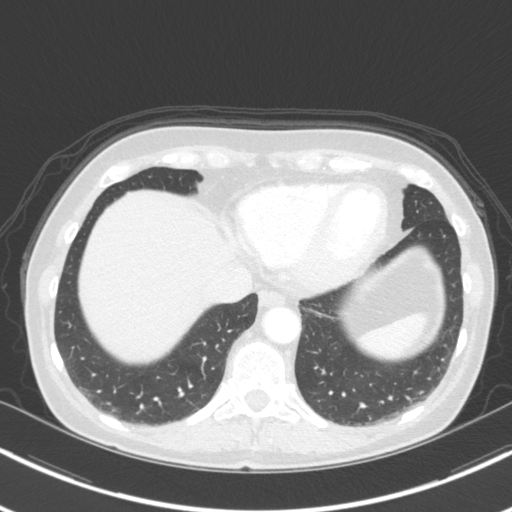
[im 43/146  lung]
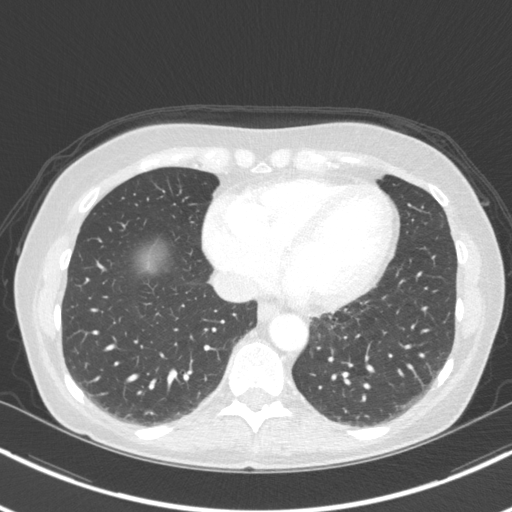
[im 54/146  mediastinal]
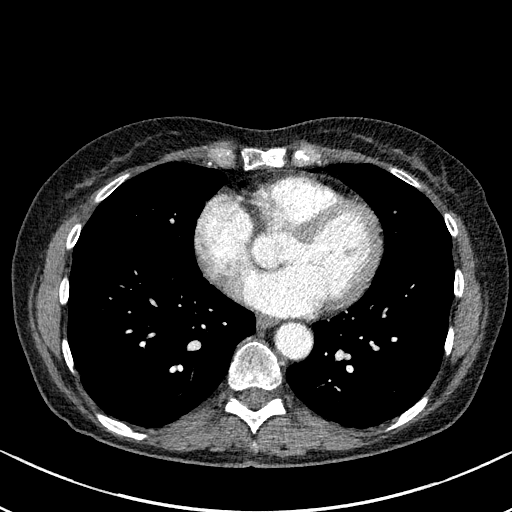
[im 54/146  lung]
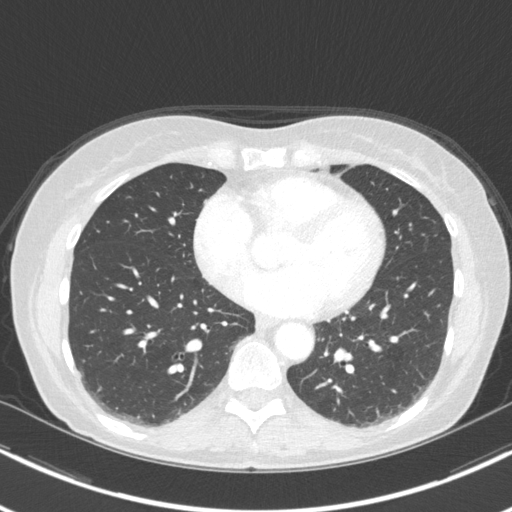
[im 65/146  lung]
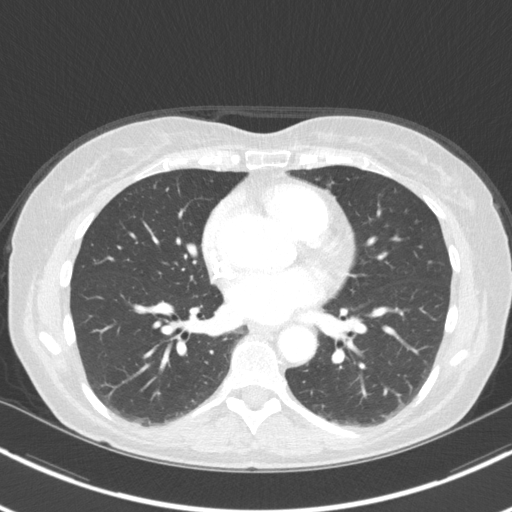
[im 81/146  lung]
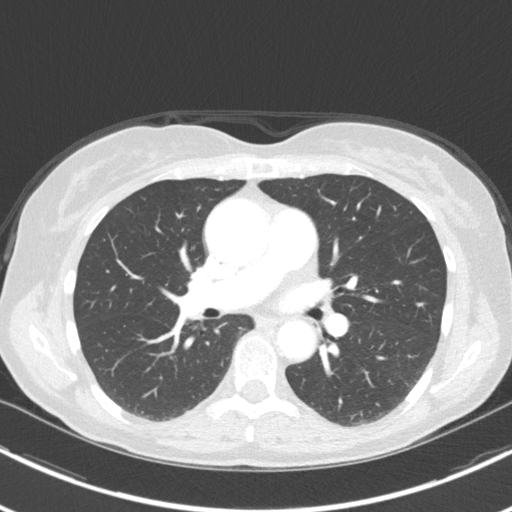
[im 92/146  lung]
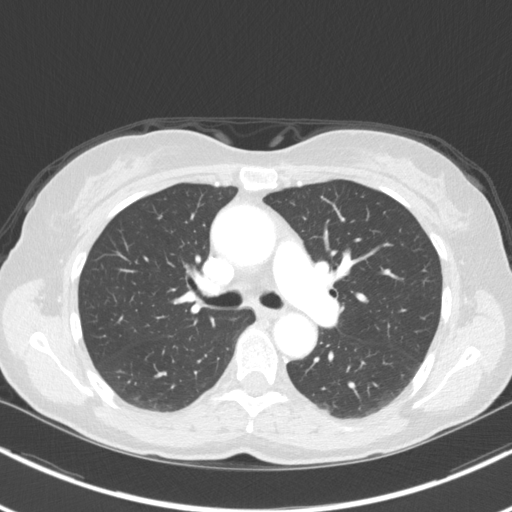
[im 103/146  mediastinal]
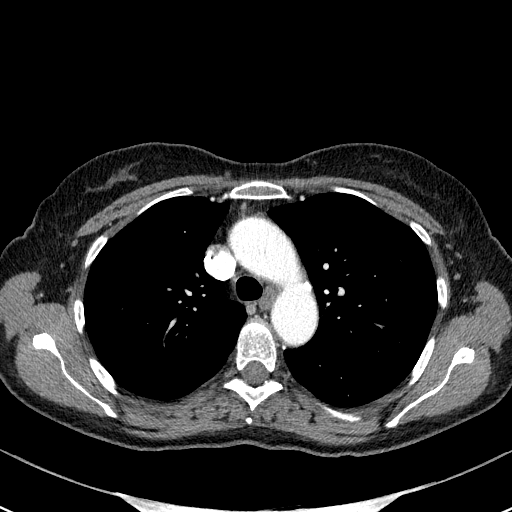
[im 103/146  lung]
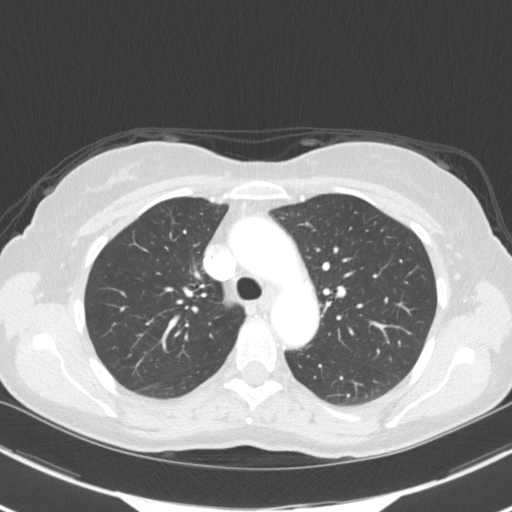
[im 113/146  lung]
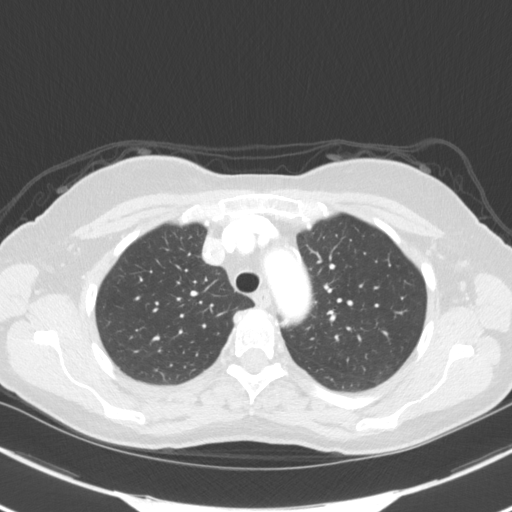
[im 124/146  lung]
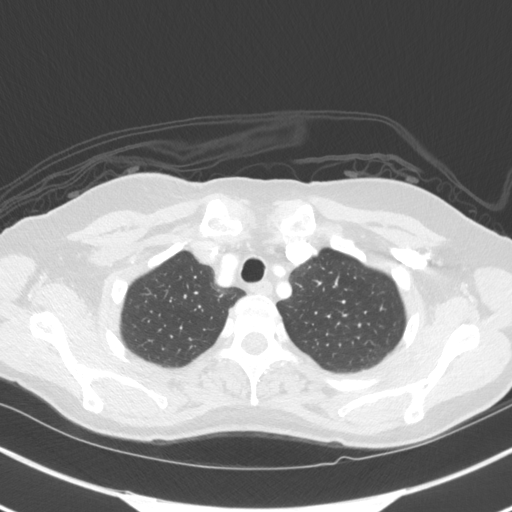
[im 135/146  lung]
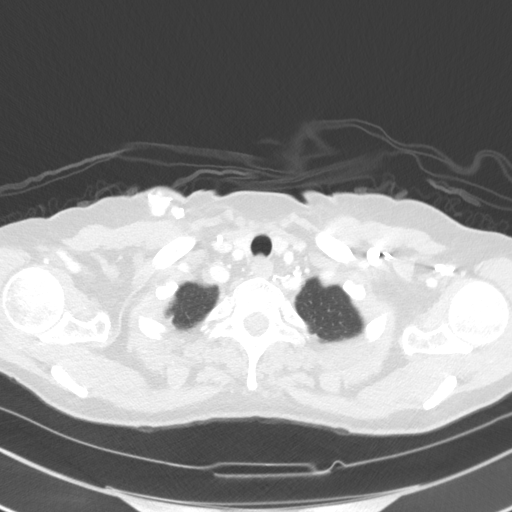

[Series 5: coronal · coronal · 0.58mm/px · 3 of 115 slices shown]
[im 23/115  lung]
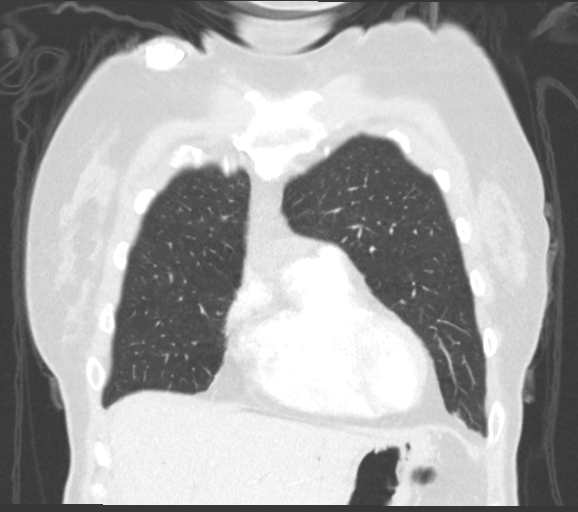
[im 46/115  lung]
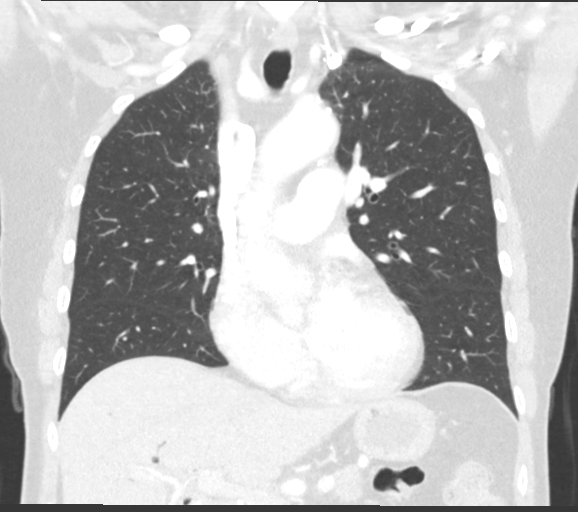
[im 69/115  lung]
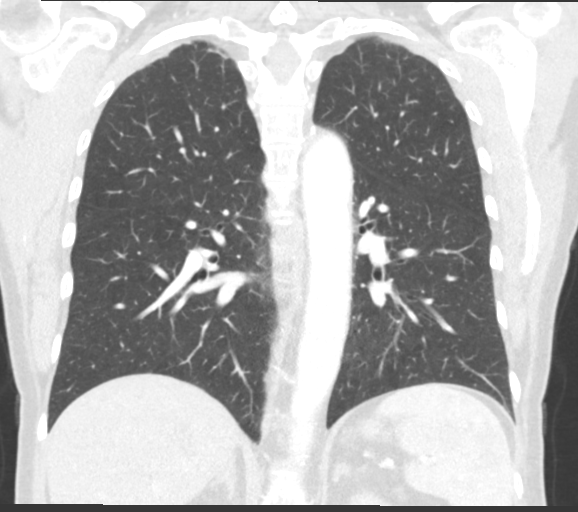

[15 of 36 positions shown; findings below may reference images not displayed]

FINDINGS: Cardiovascular: Heart size is normal. There is no significant
pericardial fluid, thickening or pericardial calcification. Aortic
atherosclerosis with ectasia of the ascending thoracic aorta (4.4 cm
in diameter). No definite coronary artery calcifications. Right
internal jugular single-lumen porta cath with tip terminating in the
superior aspect of the right atrium.

Mediastinum/Nodes: No pathologically enlarged mediastinal or hilar
lymph nodes. Esophagus is unremarkable in appearance. No axillary
lymphadenopathy.

Lungs/Pleura: No suspicious appearing pulmonary nodules or masses
are noted. No acute consolidative airspace disease. No pleural
effusions.

Upper Abdomen: Extensive pneumobilia.

Musculoskeletal: There are no aggressive appearing lytic or blastic
lesions noted in the visualized portions of the skeleton.
IMPRESSION: 1. No findings to suggest metastatic disease to the thorax.
2. Aortic atherosclerosis with ectasia of the ascending thoracic
aorta (4.4 cm in diameter). Recommend annual imaging followup by CTA
or MRA. This recommendation follows [34]
ACCF/AHA/AATS/ACR/ASA/SCA/GATLING/GATLING/GATLING/GATLING Guidelines for the
Diagnosis and Management of Patients with Thoracic Aortic Disease.
Circulation. [34]; 121: [id number]. Aortic aneurysm NOS
([34]-[34]).

Aortic Atherosclerosis ([34]-[34]).

## 2020-03-13 MED ORDER — IOHEXOL 300 MG/ML  SOLN
75.0000 mL | Freq: Once | INTRAMUSCULAR | Status: AC | PRN
  Administered 2020-03-13: 75 mL via INTRAVENOUS

## 2020-03-13 NOTE — Telephone Encounter (Signed)
checked with Blima Singer and there is and opening tomorrow in infusion. Per Dr. Burnett Harry, schedule patient for lab (cbc,cmp)/MD/ 5FU pump on 9/17; dc pump on 9/20. Cancel appt on 9/16. Please notify pt of appts. Will get future appts figured out tomorrow after appts.

## 2020-03-13 NOTE — Telephone Encounter (Signed)
Pt has been scheduled and notified.

## 2020-03-13 NOTE — Telephone Encounter (Signed)
Message was left on pts VM making her aware of her 03/13/20 Infusion being cx and made aware of her NEW sched appt for  lab/MD/5FU pump on 03/14/20 and Pump D/C on 03/17/20  per MD.

## 2020-03-13 NOTE — Telephone Encounter (Signed)
Pt informed

## 2020-03-14 ENCOUNTER — Encounter: Payer: Self-pay | Admitting: Oncology

## 2020-03-14 ENCOUNTER — Inpatient Hospital Stay: Payer: BC Managed Care – PPO

## 2020-03-14 ENCOUNTER — Inpatient Hospital Stay (HOSPITAL_BASED_OUTPATIENT_CLINIC_OR_DEPARTMENT_OTHER): Payer: BC Managed Care – PPO | Admitting: Oncology

## 2020-03-14 ENCOUNTER — Ambulatory Visit: Payer: BC Managed Care – PPO

## 2020-03-14 VITALS — BP 125/82 | HR 63 | Temp 98.8°F | Resp 16 | Wt 142.4 lb

## 2020-03-14 DIAGNOSIS — R739 Hyperglycemia, unspecified: Secondary | ICD-10-CM | POA: Diagnosis not present

## 2020-03-14 DIAGNOSIS — Z5111 Encounter for antineoplastic chemotherapy: Secondary | ICD-10-CM | POA: Diagnosis not present

## 2020-03-14 DIAGNOSIS — G459 Transient cerebral ischemic attack, unspecified: Secondary | ICD-10-CM

## 2020-03-14 DIAGNOSIS — Z95828 Presence of other vascular implants and grafts: Secondary | ICD-10-CM

## 2020-03-14 DIAGNOSIS — C259 Malignant neoplasm of pancreas, unspecified: Secondary | ICD-10-CM

## 2020-03-14 DIAGNOSIS — R634 Abnormal weight loss: Secondary | ICD-10-CM

## 2020-03-14 DIAGNOSIS — K831 Obstruction of bile duct: Secondary | ICD-10-CM

## 2020-03-14 LAB — CBC WITH DIFFERENTIAL/PLATELET
Abs Immature Granulocytes: 0.01 10*3/uL (ref 0.00–0.07)
Basophils Absolute: 0 10*3/uL (ref 0.0–0.1)
Basophils Relative: 1 %
Eosinophils Absolute: 0.1 10*3/uL (ref 0.0–0.5)
Eosinophils Relative: 1 %
HCT: 31.8 % — ABNORMAL LOW (ref 36.0–46.0)
Hemoglobin: 11.4 g/dL — ABNORMAL LOW (ref 12.0–15.0)
Immature Granulocytes: 0 %
Lymphocytes Relative: 25 %
Lymphs Abs: 1.2 10*3/uL (ref 0.7–4.0)
MCH: 32.4 pg (ref 26.0–34.0)
MCHC: 35.8 g/dL (ref 30.0–36.0)
MCV: 90.3 fL (ref 80.0–100.0)
Monocytes Absolute: 0.1 10*3/uL (ref 0.1–1.0)
Monocytes Relative: 2 %
Neutro Abs: 3.4 10*3/uL (ref 1.7–7.7)
Neutrophils Relative %: 71 %
Platelets: 253 10*3/uL (ref 150–400)
RBC: 3.52 MIL/uL — ABNORMAL LOW (ref 3.87–5.11)
RDW: 13.2 % (ref 11.5–15.5)
WBC: 4.8 10*3/uL (ref 4.0–10.5)
nRBC: 0 % (ref 0.0–0.2)

## 2020-03-14 LAB — COMPREHENSIVE METABOLIC PANEL
ALT: 49 U/L — ABNORMAL HIGH (ref 0–44)
AST: 34 U/L (ref 15–41)
Albumin: 3.2 g/dL — ABNORMAL LOW (ref 3.5–5.0)
Alkaline Phosphatase: 133 U/L — ABNORMAL HIGH (ref 38–126)
Anion gap: 8 (ref 5–15)
BUN: 14 mg/dL (ref 8–23)
CO2: 27 mmol/L (ref 22–32)
Calcium: 8.4 mg/dL — ABNORMAL LOW (ref 8.9–10.3)
Chloride: 99 mmol/L (ref 98–111)
Creatinine, Ser: 0.68 mg/dL (ref 0.44–1.00)
GFR calc Af Amer: >60 mL/min (ref >60)
GFR calc non Af Amer: >60 mL/min (ref >60)
Glucose, Bld: 215 mg/dL — ABNORMAL HIGH (ref 70–99)
Potassium: 3.8 mmol/L (ref 3.5–5.1)
Sodium: 134 mmol/L — ABNORMAL LOW (ref 135–145)
Total Bilirubin: 1.9 mg/dL — ABNORMAL HIGH (ref 0.3–1.2)
Total Protein: 6.2 g/dL — ABNORMAL LOW (ref 6.5–8.1)

## 2020-03-14 MED ORDER — SODIUM CHLORIDE 0.9 % IV SOLN
10.0000 mg | Freq: Once | INTRAVENOUS | Status: AC
  Administered 2020-03-14: 10 mg via INTRAVENOUS
  Filled 2020-03-14: qty 10

## 2020-03-14 MED ORDER — SODIUM CHLORIDE 0.9% FLUSH
10.0000 mL | Freq: Once | INTRAVENOUS | Status: AC
  Administered 2020-03-14: 10 mL via INTRAVENOUS
  Filled 2020-03-14: qty 10

## 2020-03-14 MED ORDER — SODIUM CHLORIDE 0.9 % IV SOLN
2400.0000 mg/m2 | INTRAVENOUS | Status: DC
  Administered 2020-03-14: 4200 mg via INTRAVENOUS
  Filled 2020-03-14: qty 84

## 2020-03-14 MED ORDER — HEPARIN SOD (PORK) LOCK FLUSH 100 UNIT/ML IV SOLN
500.0000 [IU] | Freq: Once | INTRAVENOUS | Status: DC
  Filled 2020-03-14: qty 5

## 2020-03-14 MED ORDER — SODIUM CHLORIDE 0.9 % IV SOLN
Freq: Once | INTRAVENOUS | Status: AC
  Filled 2020-03-14: qty 250

## 2020-03-14 NOTE — Progress Notes (Signed)
Hematology/Oncology follow up note [facility] [phone] [phone]   Patient Care Team: Flinchum, Kelby Aline, FNP as PCP - General (Family Medicine) Flinchum, Kelby Aline, FNP (Family Medicine) [name], RN as Oncology Nurse Navigator  REFERRING PROVIDER: Sharmon Leyden*  CHIEF COMPLAINTS/REASON FOR VISIT:  Follow up for treatment of pancreatic adenocarcinoma  HISTORY OF PRESENTING ILLNESS:   Cassandra [name] is a  64 y.o.  female with PMH listed below was seen in consultation at the request of  Flinchum, Kelby Aline, F*  for evaluation of pancreatic adenocarcinoma Patient initially presented with jaundice, transaminitis, bilirubin was 9.9.  CA 19-9 was 1874.  Patient also reports unintentional weight loss. 02/08/2020 MRI abdomen and MRCP with and without contrast was done at [facility] which showed pancreatic head mass measuring up to 3 cm, with marked associated narrowing of the portal vein confluence.  SMA is preserved.  Marked intrahepatic and extrahepatic biliary duct dilatation as well as mild dilatation of the main pancreatic duct. Multiple hepatic masses highly concerning for metastatic disease.  Patient underwent EUS on 02/19/2020, which showed irregular mass identified in the pancreatic head, hypoechoic, measured 43mx33mm, sonographic evidence concerning for invasion into the superior mesenteric artery.  There is no sign of significant abnormality in the main pancreatic duct.  Dilatation of common bile duct which measured up to 16 mm.  Region of celiac artery was visualized and showed no signs of significant abnormality.  No lymphadenopathy.  FNA showed adenocarcinoma.  02/19/2020, ERCP, malignant.  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  The stricture was treated with placement of wall flex metal stent.  [facility] ZPia Mauand was  recommended for 3 drug regimen FOLFIRINOX.  [facility]  Patient was referred to establish care today. She denies any pain.  Since stent placement, skin jaundice has improved.  Itchiness has also improved. Patient was accompanied by her husband today.  She has a family history of breast cancer in sister and paternal aunt, colon cancer paternal grandmother.  INTERVAL HISTORY Cassandra SPOERLis a 64y.o. female who has above history reviewed by me today presents for follow up visit for management of stage IV pancreatic cancer Problems and complaints are listed below: Patient had oxaliplatin and 50% of the irinotecan on day 1 of treatment, she developed slurred speech while she spoke to her husband.  Patient was seen by nurse practitioner JAnderson Maltaand patient was given Benadryl, Solu-Medrol 125 mg IV x1.  Vital signs are normal. I was notified and I saw the patient at the chair side.  Her symptoms is better but not fully resolved.  She feels " thick in my mouth".  Chemotherapy was held.  5-FU pump was not..  Patient was recommended to go to emergency room for evaluation of stroke.  Her symptoms fully resolved in ER.  Patient was seen by neurologist during admission.  She also had a stroke work-up including CT head, CT angiogram head, CTA neck, MRI brain, carotid duplex, echocardiogram.  All within normal limits.  Some mild atherosclerotic plaque formation on CTA neck and a carotid duplex.  Working diagnosis is TIA with slurred speech.  Patient was discharged on aspirin 81 mg and Lipitor 40 mg. Patient was noted to have hypoglycemia, A1c is 5.5. Today patient was seen post hospitalization and evaluation prior to chemotherapy with 5-FU pump. She  denies any additional episodes of slurred speech.  She has not started aspirin 81 mg or Lipitor 40 mg yet. She feels tired today.  Lack of appetite.  Not eating very much.  Weight loss 3 pounds.  Review of Systems    Constitutional: Positive for appetite change, fatigue and unexpected weight change. Negative for chills and fever.  HENT:   Negative for hearing loss and voice change.   Eyes: Negative for eye problems.  Respiratory: Negative for chest tightness and cough.   Cardiovascular: Negative for chest pain.  Gastrointestinal: Negative for abdominal distention, abdominal pain and blood in stool.  Endocrine: Negative for hot flashes.  Genitourinary: Negative for difficulty urinating and frequency.   Musculoskeletal: Negative for arthralgias.  Skin: Negative for itching and rash.  Neurological: Negative for extremity weakness.  Hematological: Negative for adenopathy.  Psychiatric/Behavioral: Negative for confusion.    MEDICAL HISTORY:  Past Medical History:  Diagnosis Date  . Cancer [facility])    pancreatic cancer  . Colon polyps   . Osteopenia after menopause 05/2017   femoral neck T score -2.0    SURGICAL HISTORY: Past Surgical History:  Procedure Laterality Date  . COLONOSCOPY  07/2015   WNL  . COLONOSCOPY  2008/2011  . OVARIAN CYST REMOVAL  1992   dermoid-Dr Robertson  . PORTA CATH INSERTION N/A 03/07/2020   Procedure: PORTA CATH INSERTION;  Surgeon: Algernon Huxley, MD;  Location: [facility];  Service: Cardiovascular;  Laterality: N/A;  . TUBAL LIGATION  1993    SOCIAL HISTORY: Social History   Socioeconomic History  . Marital status: Married    Spouse name: Not on file  . Number of children: 2  . Years of education: Not on file  . Highest education level: Not on file  Occupational History  . Occupation: Pharmacist, hospital    Comment: retired  . Occupation: Farmer  Tobacco Use  . Smoking status: Former Smoker    Packs/day: 0.50    Years: 10.00    Pack years: 5.00    Types: Cigarettes    Quit date: 1987    Years since quitting: 34.7  . Smokeless tobacco: Never Used  . Tobacco comment: Quit smoking 1987  Vaping Use  . Vaping Use: Never used  Substance and Sexual Activity  .  Alcohol use: Yes    Comment: 0-2 mixed drinks a day  . Drug use: No  . Sexual activity: Not Currently    Birth control/protection: Post-menopausal  Other Topics Concern  . Not on file  Social History Narrative  . Not on file   Social Determinants of Health   Financial Resource Strain:   . Difficulty of Paying Living Expenses: Not on file  Food Insecurity:   . Worried About Charity fundraiser in the Last Year: Not on file  . Ran Out of Food in the Last Year: Not on file  Transportation Needs:   . Lack of Transportation (Medical): Not on file  . Lack of Transportation (Non-Medical): Not on file  Physical Activity:   . Days of Exercise per Week: Not on file  . Minutes of Exercise per Session: Not on file  Stress:   . Feeling of Stress : Not on file  Social Connections:   . Frequency of Communication with Friends and Family: Not on file  . Frequency of Social Gatherings with Friends and Family: Not on file  . Attends Religious Services: Not on file  . Active Member of Clubs or Organizations: Not  on file  . Attends Archivist Meetings: Not on file  . Marital Status: Not on file  Intimate Partner Violence:   . Fear of Current or Ex-Partner: Not on file  . Emotionally Abused: Not on file  . Physically Abused: Not on file  . Sexually Abused: Not on file    FAMILY HISTORY: Family History  Problem Relation Age of Onset  . Breast cancer Paternal Aunt 52  . Diabetes Mother   . Osteoporosis Mother   . Hyperlipidemia Father   . Rheumatic fever Father   . Valvular heart disease Father   . Colon cancer Paternal Grandmother   . Breast cancer Sister 23    ALLERGIES:  is allergic to penicillin g.  MEDICATIONS:  Current Outpatient Medications  Medication Sig Dispense Refill  . aspirin EC 81 MG tablet Take 1 tablet (81 mg total) by mouth daily. Swallow whole. 150 tablet 2  . atorvastatin (LIPITOR) 40 MG tablet Take 1 tablet (40 mg total) by mouth daily. 30 tablet 0  .  calcium-vitamin D (OSCAL WITH D) 500-200 MG-UNIT TABS tablet Take 1 tablet by mouth daily.     . Cholecalciferol 25 MCG (1000 UT) tablet Take 2,000 Units by mouth daily.    Marland Kitchen lidocaine-prilocaine (EMLA) cream Apply to affected area once 30 g 3  . loperamide (IMODIUM A-D) 2 MG tablet Take 2 at onset of diarrhea, then 1 every 2hrs until 12hr without a BM. May take 2 tab every 4hrs at bedtime. If diarrhea recurs repeat. 100 tablet 1  . Multiple Vitamins-Minerals (MULTIVITAMIN ADULT PO) Take by mouth.    . ondansetron (ZOFRAN) 8 MG tablet Take 1 tablet (8 mg total) by mouth 2 (two) times daily as needed. Start on day 3 after chemotherapy. 30 tablet 1  . prochlorperazine (COMPAZINE) 10 MG tablet Take 1 tablet (10 mg total) by mouth every 6 (six) hours as needed (Nausea or vomiting). 30 tablet 1   No current facility-administered medications for this visit.   Facility-Administered Medications Ordered in Other Visits  Medication Dose Route Frequency Provider Last Rate Last Admin  . heparin lock flush 100 unit/mL  500 Units Intravenous Once [name], MD      . sodium chloride flush (NS) 0.9 % injection 10 mL  10 mL Intravenous Once [name], MD         PHYSICAL EXAMINATION: ECOG PERFORMANCE STATUS: 0 - Asymptomatic Vitals:   03/14/20 0828  BP: 125/82  Pulse: 63  Resp: 16  Temp: 98.8 F (37.1 C)   Filed Weights   03/14/20 0828  Weight: 142 lb 6.4 oz (64.6 kg)    Physical Exam Constitutional:      General: She is not in acute distress. HENT:     Head: Normocephalic and atraumatic.  Eyes:     General: No scleral icterus. Cardiovascular:     Rate and Rhythm: Normal rate and regular rhythm.     Heart sounds: Normal heart sounds.  Pulmonary:     Effort: Pulmonary effort is normal. No respiratory distress.     Breath sounds: No wheezing.  Abdominal:     General: Bowel sounds are normal. There is no distension.     Palpations: Abdomen is soft.  Musculoskeletal:        General: No  deformity. Normal range of motion.     Cervical back: Normal range of motion and neck supple.  Skin:    General: Skin is warm and dry.  Coloration: Skin is not jaundiced.     Findings: No erythema or rash.  Neurological:     Mental Status: She is alert and oriented to person, place, and time. Mental status is at baseline.     Cranial Nerves: No cranial nerve deficit.     Coordination: Coordination normal.  Psychiatric:        Mood and Affect: Mood normal.     LABORATORY DATA:  I have reviewed the data as listed Lab Results  Component Value Date   WBC 6.8 03/11/2020   HGB 11.7 (L) 03/11/2020   HCT 32.6 (L) 03/11/2020   MCV 90.8 03/11/2020   PLT 309 03/11/2020   Recent Labs    02/06/20 1049 02/06/20 1049 02/29/20 1314 02/29/20 1314 03/11/20 0826 03/11/20 1624 03/13/20 0900  NA 137  --  138  --  139 141  --   K 4.1   < > 4.1  --  4.0 4.0  --   CL 99   < > 103  --  105 106  --   CO2 23   < > 26  --  26 27  --   GLUCOSE 113*  --  109*  --  208* 242*  --   BUN 11  --  12  --  10 8  --   CREATININE 0.80   < > 0.86   < > 0.72 0.63 0.80  CALCIUM 10.2   < > 9.1  --  8.5* 8.8*  --   GFRNONAA 78   < > >60  --  >60 >60  --   GFRAA 90   < > >60  --  >60 >60  --   PROT 7.0  --  7.1  --  6.2* 6.7  --   ALBUMIN 4.9*  --  3.8  --  3.2* 3.5  --   AST 195*   < > 170*  --  37 34  --   ALT 366*   < > 283*  --  61* 62*  --   ALKPHOS 934*   < > 374*  --  148* 162*  --   BILITOT 9.9*  --  4.4*  --  1.8* 2.0*  --   BILIDIR 8.11*  --   --   --   --   --   --   IBILI 1.79*  --   --   --   --   --   --    < > = values in this interval not displayed.   Iron/TIBC/Ferritin/ %Sat No results found for: IRON, TIBC, FERRITIN, IRONPCTSAT    RADIOGRAPHIC STUDIES: I have personally reviewed the radiological images as listed and agreed with the findings in the report. Reviewed findings of MRI abdomen MRCP done at [facility]. CT ANGIO HEAD W OR WO CONTRAST  Result Date: 03/12/2020 CLINICAL DATA:   Slurred speech.  Pancreatic cancer patient. EXAM: CT ANGIOGRAPHY HEAD AND NECK TECHNIQUE: Multidetector CT imaging of the head and neck was performed using the standard protocol during bolus administration of intravenous contrast. Multiplanar CT image reconstructions and MIPs were obtained to evaluate the vascular anatomy. Carotid stenosis measurements (when applicable) are obtained utilizing NASCET criteria, using the distal internal carotid diameter as the denominator. CONTRAST:  32mL OMNIPAQUE IOHEXOL 350 MG/ML SOLN COMPARISON:  Head CT yesterday. FINDINGS: CT HEAD FINDINGS Brain: No evidence of acute infarction. No accelerated brain atrophy. No mass, hemorrhage, hydrocephalus or extra-axial collection. Vascular: There is atherosclerotic calcification  of the major vessels at the base of the brain. Skull: Negative Sinuses: Clear Orbits: Normal Review of the MIP images confirms the above findings CTA NECK FINDINGS Aortic arch: Aortic atherosclerosis. Branching pattern is normal without origin stenosis. Right carotid system: Common carotid artery widely patent to the bifurcation. Calcified plaque at the carotid bifurcation but no stenosis. ICA bulb widely patent. Cervical ICA is normal. Left carotid system: Common carotid artery widely patent to the bifurcation. Carotid bifurcation is normal without plaque or stenosis. Cervical ICA is normal. Vertebral arteries: Both vertebral arteries widely patent at their origins and through the cervical region to the foramen magnum. Skeleton: Ordinary cervical spondylosis. Other neck: No mass or lymphadenopathy. Upper chest: Normal Review of the MIP images confirms the above findings CTA HEAD FINDINGS Anterior circulation: Both internal carotid arteries widely patent through the skull base and siphon regions. Ordinary siphon atherosclerotic calcification but without stenosis greater than 30%. The anterior and middle cerebral vessels are normal without proximal stenosis, aneurysm  or vascular malformation. No large or medium vessel occlusion. Posterior circulation: Both vertebral arteries widely patent to the basilar. No basilar stenosis. Posterior circulation branch vessels are normal. Venous sinuses: Patent and normal. Anatomic variants: None significant. Review of the MIP images confirms the above findings IMPRESSION: 1. Mild atherosclerotic disease at the right carotid bifurcation but without stenosis. Left carotid bifurcation normal. 2. No intracranial large or medium vessel occlusion or correctable proximal stenosis. 3. Aortic atherosclerosis. Aortic Atherosclerosis (ICD10-I70.0). Electronically Signed   By: Nelson Chimes M.D.   On: 03/12/2020 10:46   CT HEAD WO CONTRAST  Result Date: 03/11/2020 CLINICAL DATA:  Slurred speech EXAM: CT HEAD WITHOUT CONTRAST TECHNIQUE: Contiguous axial images were obtained from the base of the skull through the vertex without intravenous contrast. COMPARISON:  None. FINDINGS: Brain: No evidence of acute territorial infarction, hemorrhage, hydrocephalus,extra-axial collection or mass lesion/mass effect. Normal gray-white differentiation. Ventricles are normal in size and contour. Vascular: No hyperdense vessel or unexpected calcification. Skull: The skull is intact. No fracture or focal lesion identified. Sinuses/Orbits: The visualized paranasal sinuses and mastoid air cells are clear. The orbits and globes intact. Other: None IMPRESSION: No acute intracranial abnormality. Electronically Signed   By: Prudencio Pair M.D.   On: 03/11/2020 16:48   CT ANGIO NECK W OR WO CONTRAST  Result Date: 03/12/2020 CLINICAL DATA:  Slurred speech.  Pancreatic cancer patient. EXAM: CT ANGIOGRAPHY HEAD AND NECK TECHNIQUE: Multidetector CT imaging of the head and neck was performed using the standard protocol during bolus administration of intravenous contrast. Multiplanar CT image reconstructions and MIPs were obtained to evaluate the vascular anatomy. Carotid stenosis  measurements (when applicable) are obtained utilizing NASCET criteria, using the distal internal carotid diameter as the denominator. CONTRAST:  42mL OMNIPAQUE IOHEXOL 350 MG/ML SOLN COMPARISON:  Head CT yesterday. FINDINGS: CT HEAD FINDINGS Brain: No evidence of acute infarction. No accelerated brain atrophy. No mass, hemorrhage, hydrocephalus or extra-axial collection. Vascular: There is atherosclerotic calcification of the major vessels at the base of the brain. Skull: Negative Sinuses: Clear Orbits: Normal Review of the MIP images confirms the above findings CTA NECK FINDINGS Aortic arch: Aortic atherosclerosis. Branching pattern is normal without origin stenosis. Right carotid system: Common carotid artery widely patent to the bifurcation. Calcified plaque at the carotid bifurcation but no stenosis. ICA bulb widely patent. Cervical ICA is normal. Left carotid system: Common carotid artery widely patent to the bifurcation. Carotid bifurcation is normal without plaque or stenosis. Cervical ICA is normal. Vertebral  arteries: Both vertebral arteries widely patent at their origins and through the cervical region to the foramen magnum. Skeleton: Ordinary cervical spondylosis. Other neck: No mass or lymphadenopathy. Upper chest: Normal Review of the MIP images confirms the above findings CTA HEAD FINDINGS Anterior circulation: Both internal carotid arteries widely patent through the skull base and siphon regions. Ordinary siphon atherosclerotic calcification but without stenosis greater than 30%. The anterior and middle cerebral vessels are normal without proximal stenosis, aneurysm or vascular malformation. No large or medium vessel occlusion. Posterior circulation: Both vertebral arteries widely patent to the basilar. No basilar stenosis. Posterior circulation branch vessels are normal. Venous sinuses: Patent and normal. Anatomic variants: None significant. Review of the MIP images confirms the above findings  IMPRESSION: 1. Mild atherosclerotic disease at the right carotid bifurcation but without stenosis. Left carotid bifurcation normal. 2. No intracranial large or medium vessel occlusion or correctable proximal stenosis. 3. Aortic atherosclerosis. Aortic Atherosclerosis (ICD10-I70.0). Electronically Signed   By: Nelson Chimes M.D.   On: 03/12/2020 10:46   CT CHEST W CONTRAST  Result Date: 03/13/2020 CLINICAL DATA:  64 year old female with history of pancreatic cancer. EXAM: CT CHEST WITH CONTRAST TECHNIQUE: Multidetector CT imaging of the chest was performed during intravenous contrast administration. CONTRAST:  41mL OMNIPAQUE IOHEXOL 300 MG/ML  SOLN COMPARISON:  No priors. FINDINGS: Cardiovascular: Heart size is normal. There is no significant pericardial fluid, thickening or pericardial calcification. 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 No definite coronary artery calcifications. Right internal jugular single-lumen porta cath with tip terminating in the superior aspect of the right atrium. Mediastinum/Nodes: No pathologically enlarged mediastinal or hilar lymph nodes. Esophagus is unremarkable in appearance. No axillary lymphadenopathy. Lungs/Pleura: No suspicious appearing pulmonary nodules or masses are noted. No acute consolidative airspace disease. No pleural effusions. Upper Abdomen: Extensive pneumobilia. Musculoskeletal: There are no aggressive appearing lytic or blastic lesions noted in the visualized portions of the skeleton. IMPRESSION: 1. No findings to suggest metastatic disease to the thorax. 2. 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 Recommend annual imaging followup by CTA or MRA. This recommendation follows 2010 ACCF/AHA/AATS/ACR/ASA/SCA/SCAI/SIR/STS/SVM Guidelines for the Diagnosis and Management of Patients with Thoracic Aortic Disease. Circulation. 2010; 121: B510-C585. Aortic aneurysm NOS  (ICD10-I71.9). Aortic Atherosclerosis (ICD10-I70.0). Electronically Signed   By: Vinnie Langton M.D.   On: 03/13/2020 11:59   MR BRAIN WO CONTRAST  Result Date: 03/12/2020 CLINICAL DATA:  Transit ischemic attack.  Slurred speech. EXAM: MRI HEAD WITHOUT CONTRAST TECHNIQUE: Multiplanar, multiecho pulse sequences of the brain and surrounding structures were obtained without intravenous contrast. COMPARISON:  03/11/2020 head CT.  03/12/2020 CTA head and neck. FINDINGS: Brain: No acute infarct or intracranial hemorrhage. Cerebral volume is within normal limits. Minimal chronic microvascular ischemic changes. No midline shift, ventriculomegaly or extra-axial fluid collection. No mass lesion. Vascular: Please see same day CTA. Skull and upper cervical spine: Normal marrow signal. Sinuses/Orbits: Normal orbits. Clear paranasal sinuses. No mastoid effusion. Other: None. IMPRESSION: No acute intracranial process. Minimal chronic microvascular ischemic changes. Electronically Signed   By: Primitivo Gauze M.D.   On: 03/12/2020 11:49   US Carotid Bilateral (at [facility] and [facility] only)  Result Date: 03/12/2020 CLINICAL DATA:  64 year old female with a history of TIA EXAM: BILATERAL CAROTID DUPLEX ULTRASOUND TECHNIQUE: Pearline Cables scale imaging, color Doppler and duplex ultrasound were performed of bilateral carotid and vertebral arteries in the neck. COMPARISON:  None. FINDINGS: Criteria: Quantification of carotid stenosis is  based on velocity parameters that correlate the residual internal carotid diameter with NASCET-based stenosis levels, using the diameter of the distal internal carotid lumen as the denominator for stenosis measurement. The following velocity measurements were obtained: RIGHT ICA:  Systolic 779 cm/sec, Diastolic 52 cm/sec CCA:  98 cm/sec SYSTOLIC ICA/CCA RATIO:  1.6 ECA:  170 cm/sec LEFT ICA:  Systolic 89 cm/sec, Diastolic 14 cm/sec CCA:  95 cm/sec SYSTOLIC ICA/CCA RATIO:  0.9 ECA:  119 cm/sec Right Brachial  SBP: Not acquired Left Brachial SBP: Not acquired RIGHT CAROTID ARTERY: No significant calcifications of the right common carotid artery. Intermediate waveform maintained. Heterogeneous and partially calcified plaque at the right carotid bifurcation. No significant lumen shadowing. Low resistance waveform of the right ICA. No significant tortuosity. RIGHT VERTEBRAL ARTERY: Antegrade flow with low resistance waveform. LEFT CAROTID ARTERY: No significant calcifications of the left common carotid artery. Intermediate waveform maintained. Heterogeneous and partially calcified plaque at the left carotid bifurcation without significant lumen shadowing. Low resistance waveform of the left ICA. No significant tortuosity. LEFT VERTEBRAL ARTERY:  Antegrade flow with low resistance waveform. IMPRESSION: Right: Heterogeneous and partially calcified plaque at the right carotid bifurcation, with discordant results regarding degree of stenosis by established duplex criteria. Peak velocity suggests 50%-69% stenosis, with the ICA/ CCA ratio suggesting a lesser degree of stenosis. If establishing a more accurate degree of stenosis is required, cerebral angiogram should be considered, or as a second best test, CTA. Left: Color duplex indicates minimal heterogeneous and calcified plaque, with no hemodynamically significant stenosis by duplex criteria in the extracranial cerebrovascular circulation. Signed, Dulcy Fanny. Dellia Nims, RPVI [facility] Electronically Signed   By: Corrie Mckusick D.O.   On: 03/12/2020 14:02   PERIPHERAL VASCULAR CATHETERIZATION  Result Date: 03/07/2020 See op note  MM 3D SCREEN BREAST BILATERAL  Result Date: 12/17/2019 CLINICAL DATA:  Screening. EXAM: DIGITAL SCREENING BILATERAL MAMMOGRAM WITH TOMO AND CAD COMPARISON:  Previous exam(s). ACR Breast Density Category c: The breast tissue is heterogeneously dense, which may obscure small masses.  FINDINGS: There are no findings suspicious for malignancy. Images were processed with CAD. IMPRESSION: No mammographic evidence of malignancy. A result letter of this screening mammogram will be mailed directly to the patient. RECOMMENDATION: Screening mammogram in one year. (Code:[id number]) BI-RADS CATEGORY  1: Negative. Electronically Signed   By: Lajean Manes M.D.   On: 12/17/2019 09:12   ECHOCARDIOGRAM COMPLETE  Result Date: 03/12/2020    ECHOCARDIOGRAM REPORT   Patient Name:   Cassandra [name] Date of Exam: 03/12/2020 Medical Rec #:  [id number]   Height:       67.0 in Accession #:    [id number]  Weight:       145.3 lb Date of Birth:  04/29/1956   BSA:          1.765 m Patient Age:    39 years    BP:           125/74 mmHg Patient Gender: F           HR:           63 bpm. Exam Location:  [facility] Procedure: 2D Echo, Cardiac Doppler and Color Doppler Indications:     TIA 435.9  History:         Patient has no prior history of Echocardiogram examinations. No                  cardiac hsitory listed.  Sonographer:  Sherrie Sport RDCS (AE) Referring Phys:  [id number] Sidney Ace Diagnosing Phys: Kate Sable MD IMPRESSIONS  1. Left ventricular ejection fraction, by estimation, is 65 to 70%. The left ventricle has normal function. The left ventricle has no regional wall motion abnormalities. Left ventricular diastolic parameters were normal.  2. Right ventricular systolic function is normal. The right ventricular size is normal. There is normal pulmonary artery systolic pressure.  3. The mitral valve is normal in structure. No evidence of mitral valve regurgitation. No evidence of mitral stenosis.  4. The aortic valve is normal in structure. Aortic valve regurgitation is not visualized. No aortic stenosis is present.  5. The inferior vena cava is normal in size with greater than 50% respiratory variability, suggesting right atrial pressure of 3 mmHg. FINDINGS  Left Ventricle: Left ventricular ejection fraction, by  estimation, is 65 to 70%. The left ventricle has normal function. The left ventricle has no regional wall motion abnormalities. The left ventricular internal cavity size was normal in size. There is  no left ventricular hypertrophy. Left ventricular diastolic parameters were normal. Right Ventricle: The right ventricular size is normal. No increase in right ventricular wall thickness. Right ventricular systolic function is normal. There is normal pulmonary artery systolic pressure. The tricuspid regurgitant velocity is 2.57 m/s, and  with an assumed right atrial pressure of 3 mmHg, the estimated right ventricular systolic pressure is 93.2 mmHg. Left Atrium: Left atrial size was normal in size. Right Atrium: Right atrial size was normal in size. Pericardium: There is no evidence of pericardial effusion. Mitral Valve: The mitral valve is normal in structure. No evidence of mitral valve regurgitation. No evidence of mitral valve stenosis. Tricuspid Valve: The tricuspid valve is normal in structure. Tricuspid valve regurgitation is not demonstrated. No evidence of tricuspid stenosis. Aortic Valve: The aortic valve is normal in structure. Aortic valve regurgitation is not visualized. No aortic stenosis is present. Aortic valve mean gradient measures 4.5 mmHg. Aortic valve peak gradient measures 7.3 mmHg. Aortic valve area, by VTI measures 3.23 cm. Pulmonic Valve: The pulmonic valve was normal in structure. Pulmonic valve regurgitation is not visualized. No evidence of pulmonic stenosis. Aorta: The aortic root is normal in size and structure. Venous: The inferior vena cava is normal in size with greater than 50% respiratory variability, suggesting right atrial pressure of 3 mmHg. IAS/Shunts: No atrial level shunt detected by color flow Doppler.  LEFT VENTRICLE PLAX 2D LVIDd:         4.00 cm  Diastology LVIDs:         2.28 cm  LV e' medial:    7.72 cm/s LV PW:         1.50 cm  LV E/e' medial:  10.8 LV IVS:        0.90 cm   LV e' lateral:   11.50 cm/s LVOT diam:     2.00 cm  LV E/e' lateral: 7.3 LV SV:         96 LV SV Index:   54 LVOT Area:     3.14 cm  RIGHT VENTRICLE RV Basal diam:  3.77 cm RV S prime:     19.00 cm/s TAPSE (M-mode): 3.7 cm LEFT ATRIUM             Index       RIGHT ATRIUM           Index LA diam:        2.30 cm 1.30 cm/m  RA Area:  16.70 cm LA Vol (A2C):   45.2 ml 25.60 ml/m RA Volume:   45.40 ml  25.72 ml/m LA Vol (A4C):   35.1 ml 19.88 ml/m LA Biplane Vol: 39.1 ml 22.15 ml/m  AORTIC VALVE                   PULMONIC VALVE AV Area (Vmax):    3.22 cm    PV Vmax:        0.79 m/s AV Area (Vmean):   3.07 cm    PV Peak grad:   2.5 mmHg AV Area (VTI):     3.23 cm    RVOT Peak grad: 4 mmHg AV Vmax:           135.50 cm/s AV Vmean:          99.250 cm/s AV VTI:            0.297 m AV Peak Grad:      7.3 mmHg AV Mean Grad:      4.5 mmHg LVOT Vmax:         139.00 cm/s LVOT Vmean:        97.100 cm/s LVOT VTI:          0.305 m LVOT/AV VTI ratio: 1.03  AORTA Ao Root diam: 3.50 cm MITRAL VALVE               TRICUSPID VALVE MV Area (PHT): 2.39 cm    TR Peak grad:   26.4 mmHg MV Decel Time: 317 msec    TR Vmax:        257.00 cm/s MV E velocity: 83.40 cm/s MV A velocity: 72.30 cm/s  SHUNTS MV E/A ratio:  1.15        Systemic VTI:  0.30 m                            Systemic Diam: 2.00 cm Kate Sable MD Electronically signed by Kate Sable MD Signature Date/Time: 03/12/2020/4:01:51 PM    Final      ASSESSMENT & PLAN:  1. Primary pancreatic cancer  ([place])   2. Encounter for antineoplastic chemotherapy   3. TIA (transient ischemic attack)   4. Hyperglycemia   5. Obstructive jaundice   6. 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 Labs are reviewed and discussed with patient.  Counts  acceptable. Chemotherapy was interrupted due to acute neurologic symptoms.  Patient had a full stroke work-up which was nonremarkable.  Working diagnosis was TIA.  Her symptoms has all resolved. We will proceed with 5-FU pump today with pump discontinuation on 03/17/2020. Dexamethasone 10 mg x 1 for nausea prophylaxis. We also discussed about the small possibility of encephalopathy secondary to chemotherapy.   We will see how she does in cycle 2 when she is challenged with similar regimen.  Patient fully understands the risk and willing to proceed.  #TIA, recommend aspirin 81 mg.  Patient was also recommend by neurology to take Lipitor 40 mg daily.   #Hyperglycemia, possible new onset of diabetes versus stress or steroid induced. Avoid high sugar containing beverage and food.  #Obstructive jaundice status post biliary stent placement. Bilirubin stable.  Continue follow-up with gastroenterology.  #Weight loss, refer to nutritionist. Supportive care  measures are necessary for patient well-being and will be provided as necessary. We spent sufficient time to discuss many aspect of care, questions were answered to patient's satisfaction.   All questions were answered. The patient knows to call the clinic with any problems questions or concerns.  Return of visit: 1 week for evaluation of tolerability and 2 weeks for cycle 2 FOLFIRINOX    [name], MD, PhD Hematology [place] [facility] Pager- [phone] 03/14/2020

## 2020-03-14 NOTE — Progress Notes (Signed)
Patient did not have much of an appetite yesterday.

## 2020-03-17 ENCOUNTER — Inpatient Hospital Stay: Payer: BC Managed Care – PPO

## 2020-03-17 ENCOUNTER — Telehealth: Payer: Self-pay | Admitting: *Deleted

## 2020-03-17 ENCOUNTER — Other Ambulatory Visit: Payer: Self-pay

## 2020-03-17 VITALS — BP 128/88 | HR 73 | Temp 98.7°F | Resp 18

## 2020-03-17 DIAGNOSIS — C259 Malignant neoplasm of pancreas, unspecified: Secondary | ICD-10-CM

## 2020-03-17 MED ORDER — HEPARIN SOD (PORK) LOCK FLUSH 100 UNIT/ML IV SOLN
500.0000 [IU] | Freq: Once | INTRAVENOUS | Status: AC | PRN
  Administered 2020-03-17: 500 [IU]
  Filled 2020-03-17: qty 5

## 2020-03-17 MED ORDER — SODIUM CHLORIDE 0.9% FLUSH
10.0000 mL | INTRAVENOUS | Status: DC | PRN
  Administered 2020-03-17: 10 mL
  Filled 2020-03-17: qty 10

## 2020-03-17 MED ORDER — HEPARIN SOD (PORK) LOCK FLUSH 100 UNIT/ML IV SOLN
INTRAVENOUS | Status: AC
  Filled 2020-03-17: qty 5

## 2020-03-17 NOTE — Telephone Encounter (Signed)
Thank you :)

## 2020-03-17 NOTE — Telephone Encounter (Signed)
Patient called reporting that she is having generalized abdominal discomfort. She states she is not really nauseated, but did take a nausea pill to see if that would help, She is asking what she can take ie: Tylenol or something else over the counter. She states her bowels are movng but she really is not eating a lot. She is drinking fluids. I suggested she can try Carnation instant breakfast, Ensure, or Boost for meal replacements is needed. And that she can take Tylenol but NOT to take NSAIDS ( Aleve, ibuprofen) unless directed by doctor that it is alright to take. When asked if she is bloated, she stated maybe a little bit. I advised that if she is not better by tomorrow to please call me back and she agreed to.

## 2020-03-18 ENCOUNTER — Inpatient Hospital Stay: Payer: BC Managed Care – PPO | Admitting: Oncology

## 2020-03-18 ENCOUNTER — Inpatient Hospital Stay: Payer: BC Managed Care – PPO

## 2020-03-18 ENCOUNTER — Other Ambulatory Visit: Payer: Self-pay

## 2020-03-18 ENCOUNTER — Telehealth: Payer: Self-pay | Admitting: *Deleted

## 2020-03-18 ENCOUNTER — Encounter: Payer: Self-pay | Admitting: Oncology

## 2020-03-18 ENCOUNTER — Inpatient Hospital Stay (HOSPITAL_BASED_OUTPATIENT_CLINIC_OR_DEPARTMENT_OTHER): Payer: BC Managed Care – PPO | Admitting: Oncology

## 2020-03-18 VITALS — BP 119/78 | HR 83 | Temp 101.3°F | Resp 18

## 2020-03-18 VITALS — BP 115/83 | HR 114 | Temp 101.1°F | Resp 18 | Wt 137.6 lb

## 2020-03-18 DIAGNOSIS — C259 Malignant neoplasm of pancreas, unspecified: Secondary | ICD-10-CM | POA: Diagnosis not present

## 2020-03-18 DIAGNOSIS — K831 Obstruction of bile duct: Secondary | ICD-10-CM | POA: Diagnosis not present

## 2020-03-18 DIAGNOSIS — R634 Abnormal weight loss: Secondary | ICD-10-CM

## 2020-03-18 DIAGNOSIS — R140 Abdominal distension (gaseous): Secondary | ICD-10-CM

## 2020-03-18 DIAGNOSIS — E871 Hypo-osmolality and hyponatremia: Secondary | ICD-10-CM

## 2020-03-18 DIAGNOSIS — R509 Fever, unspecified: Secondary | ICD-10-CM | POA: Diagnosis not present

## 2020-03-18 DIAGNOSIS — R1084 Generalized abdominal pain: Secondary | ICD-10-CM

## 2020-03-18 DIAGNOSIS — Z5189 Encounter for other specified aftercare: Secondary | ICD-10-CM

## 2020-03-18 LAB — COMPREHENSIVE METABOLIC PANEL
ALT: 37 U/L (ref 0–44)
ALT: 36 U/L (ref 0–44)
AST: 21 U/L (ref 15–41)
AST: 20 U/L (ref 15–41)
Albumin: 3.4 g/dL — ABNORMAL LOW (ref 3.5–5.0)
Albumin: 3.6 g/dL (ref 3.5–5.0)
Alkaline Phosphatase: 123 U/L (ref 38–126)
Alkaline Phosphatase: 124 U/L (ref 38–126)
Anion gap: 10 (ref 5–15)
Anion gap: 10 (ref 5–15)
BUN: 12 mg/dL (ref 8–23)
BUN: 12 mg/dL (ref 8–23)
CO2: 24 mmol/L (ref 22–32)
CO2: 24 mmol/L (ref 22–32)
Calcium: 8.6 mg/dL — ABNORMAL LOW (ref 8.9–10.3)
Calcium: 8.5 mg/dL — ABNORMAL LOW (ref 8.9–10.3)
Chloride: 98 mmol/L (ref 98–111)
Chloride: 98 mmol/L (ref 98–111)
Creatinine, Ser: 0.69 mg/dL (ref 0.44–1.00)
Creatinine, Ser: 0.81 mg/dL (ref 0.44–1.00)
GFR calc Af Amer: >60 mL/min (ref >60)
GFR calc Af Amer: >60 mL/min (ref >60)
GFR calc non Af Amer: >60 mL/min (ref >60)
GFR calc non Af Amer: >60 mL/min (ref >60)
Glucose, Bld: 134 mg/dL — ABNORMAL HIGH (ref 70–99)
Glucose, Bld: 138 mg/dL — ABNORMAL HIGH (ref 70–99)
Potassium: 4 mmol/L (ref 3.5–5.1)
Potassium: 4 mmol/L (ref 3.5–5.1)
Sodium: 132 mmol/L — ABNORMAL LOW (ref 135–145)
Sodium: 132 mmol/L — ABNORMAL LOW (ref 135–145)
Total Bilirubin: 1.9 mg/dL — ABNORMAL HIGH (ref 0.3–1.2)
Total Bilirubin: 1.7 mg/dL — ABNORMAL HIGH (ref 0.3–1.2)
Total Protein: 6.8 g/dL (ref 6.5–8.1)
Total Protein: 6.7 g/dL (ref 6.5–8.1)

## 2020-03-18 LAB — CBC WITH DIFFERENTIAL/PLATELET
Abs Immature Granulocytes: 0.08 10*3/uL — ABNORMAL HIGH (ref 0.00–0.07)
Basophils Absolute: 0 10*3/uL (ref 0.0–0.1)
Basophils Relative: 0 %
Eosinophils Absolute: 0.1 10*3/uL (ref 0.0–0.5)
Eosinophils Relative: 1 %
HCT: 31.8 % — ABNORMAL LOW (ref 36.0–46.0)
Hemoglobin: 11.9 g/dL — ABNORMAL LOW (ref 12.0–15.0)
Immature Granulocytes: 1 %
Lymphocytes Relative: 12 %
Lymphs Abs: 1.2 10*3/uL (ref 0.7–4.0)
MCH: 32.4 pg (ref 26.0–34.0)
MCHC: 37.4 g/dL — ABNORMAL HIGH (ref 30.0–36.0)
MCV: 86.6 fL (ref 80.0–100.0)
Monocytes Absolute: 0.5 10*3/uL (ref 0.1–1.0)
Monocytes Relative: 5 %
Neutro Abs: 8.5 10*3/uL — ABNORMAL HIGH (ref 1.7–7.7)
Neutrophils Relative %: 81 %
Platelets: 256 10*3/uL (ref 150–400)
RBC: 3.67 MIL/uL — ABNORMAL LOW (ref 3.87–5.11)
RDW: 12.5 % (ref 11.5–15.5)
WBC: 10.3 10*3/uL (ref 4.0–10.5)
nRBC: 0 % (ref 0.0–0.2)

## 2020-03-18 LAB — URINALYSIS, COMPLETE (UACMP) WITH MICROSCOPIC
Bacteria, UA: NONE SEEN
Bilirubin Urine: NEGATIVE
Glucose, UA: NEGATIVE mg/dL
Hgb urine dipstick: NEGATIVE
Ketones, ur: 20 mg/dL — AB
Nitrite: NEGATIVE
Protein, ur: 30 mg/dL — AB
Specific Gravity, Urine: 1.023 (ref 1.005–1.030)
pH: 6 (ref 5.0–8.0)

## 2020-03-18 MED ORDER — SODIUM CHLORIDE 0.9% FLUSH
10.0000 mL | INTRAVENOUS | Status: DC | PRN
  Administered 2020-03-18: 10 mL via INTRAVENOUS
  Filled 2020-03-18: qty 10

## 2020-03-18 MED ORDER — HEPARIN SOD (PORK) LOCK FLUSH 100 UNIT/ML IV SOLN
500.0000 [IU] | Freq: Once | INTRAVENOUS | Status: AC
  Administered 2020-03-18: 500 [IU] via INTRAVENOUS
  Filled 2020-03-18: qty 5

## 2020-03-18 MED ORDER — HEPARIN SOD (PORK) LOCK FLUSH 100 UNIT/ML IV SOLN
INTRAVENOUS | Status: AC
  Filled 2020-03-18: qty 5

## 2020-03-18 MED ORDER — SODIUM CHLORIDE 0.9 % IV SOLN
Freq: Once | INTRAVENOUS | Status: AC
  Filled 2020-03-18: qty 250

## 2020-03-18 MED ORDER — SIMETHICONE 80 MG PO CHEW: 80.0000 mg | CHEWABLE_TABLET | Freq: Three times a day (TID) | ORAL | 0 refills | Status: AC | PRN

## 2020-03-18 NOTE — Progress Notes (Signed)
Pt reports being very fatigued and weak. Pt feels very bloated, but has not passed much gas. Denies diarrhea. Temp today is 101.1.

## 2020-03-18 NOTE — Telephone Encounter (Signed)
Please schedule her to see Dr. Tasia Catchings today.

## 2020-03-18 NOTE — Progress Notes (Signed)
Hematology/Oncology follow up note [facility] [phone] [phone]   Patient Care Team: Flinchum, Kelby Aline, FNP as PCP - General (Family Medicine) Flinchum, Kelby Aline, FNP (Family Medicine) [name], RN as Oncology Nurse Navigator  REFERRING PROVIDER: Sharmon Leyden*  CHIEF COMPLAINTS/REASON FOR VISIT:  Follow up for treatment of pancreatic adenocarcinoma  HISTORY OF PRESENTING ILLNESS:   Cassandra [name] is a  64 y.o.  female with PMH listed below was seen in consultation at the request of  Flinchum, Kelby Aline, F*  for evaluation of pancreatic adenocarcinoma Patient initially presented with jaundice, transaminitis, bilirubin was 9.9.  CA 19-9 was 1874.  Patient also reports unintentional weight loss. 02/08/2020 MRI abdomen and MRCP with and without contrast was done at [facility] which showed pancreatic head mass measuring up to 3 cm, with marked associated narrowing of the portal vein confluence.  SMA is preserved.  Marked intrahepatic and extrahepatic biliary duct dilatation as well as mild dilatation of the main pancreatic duct. Multiple hepatic masses highly concerning for metastatic disease.  Patient underwent EUS on 02/19/2020, which showed irregular mass identified in the pancreatic head, hypoechoic, measured 43mx33mm, sonographic evidence concerning for invasion into the superior mesenteric artery.  There is no sign of significant abnormality in the main pancreatic duct.  Dilatation of common bile duct which measured up to 16 mm.  Region of celiac artery was visualized and showed no signs of significant abnormality.  No lymphadenopathy.  FNA showed adenocarcinoma.  02/19/2020, ERCP, malignant.  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  The stricture was treated with placement of wall flex metal stent.  [facility] ZPia Mauand was  recommended for 3 drug regimen FOLFIRINOX.  [facility]  Patient was referred to establish care today. She denies any pain.  Since stent placement, skin jaundice has improved.  Itchiness has also improved. Patient was accompanied by her husband today.  She has a family history of breast cancer in sister and paternal aunt, colon cancer paternal grandmother.  INTERVAL HISTORY Cassandra SPOERLis a 64y.o. female who has above history reviewed by me today presents for follow up visit for management of stage IV pancreatic cancer Problems and complaints are listed below: Patient had oxaliplatin and 50% of the irinotecan on day 1 of treatment, she developed slurred speech while she spoke to her husband.  Patient was seen by nurse practitioner JAnderson Maltaand patient was given Benadryl, Solu-Medrol 125 mg IV x1.  Vital signs are normal. I was notified and I saw the patient at the chair side.  Her symptoms is better but not fully resolved.  She feels " thick in my mouth".  Chemotherapy was held.  5-FU pump was not..  Patient was recommended to go to emergency room for evaluation of stroke.  Her symptoms fully resolved in ER.  Patient was seen by neurologist during admission.  She also had a stroke work-up including CT head, CT angiogram head, CTA neck, MRI brain, carotid duplex, echocardiogram.  All within normal limits.  Some mild atherosclerotic plaque formation on CTA neck and a carotid duplex.  Working diagnosis is TIA with slurred speech.  Patient was discharged on aspirin 81 mg and Lipitor 40 mg. Patient was noted to have hypoglycemia, A1c is 5.5. Today patient was seen post hospitalization and evaluation prior to chemotherapy with 5-FU pump. She  denies any additional episodes of slurred speech.  She has not started aspirin 81 mg or Lipitor 40 mg yet. She feels tired today.  Lack of appetite.  Not eating very much.  Weight loss 3 pounds.  INTERVAL HISTORY Cassandra [name]  is a 64 y.o. female who has above history reviewed by me today presents for acute visit.   Problems and complaints are listed below: Patient has had 5-FU pump discontinued yesterday.  She reports feeling very fatigued and tired. Also has experienced abdomen discomfort.  She feels bloated.  No nausea vomiting diarrhea.  She had a bowel movement this morning which was formed.  She denies any fever at home.  Here vital signs showed a temperature 101.  She has decreased oral intake. Denies any nasal congestion, cough, sore throat, rigors, dysuria, increased frequency or urgency.  Review of Systems  Constitutional: Positive for appetite change, fatigue and unexpected weight change. Negative for chills and fever.  HENT:   Negative for hearing loss and voice change.   Eyes: Negative for eye problems.  Respiratory: Negative for chest tightness and cough.   Cardiovascular: Negative for chest pain.  Gastrointestinal: Negative for abdominal distention, abdominal pain and blood in stool.       Bloated  Endocrine: Negative for hot flashes.  Genitourinary: Negative for difficulty urinating and frequency.   Musculoskeletal: Negative for arthralgias.  Skin: Negative for itching and rash.  Neurological: Negative for extremity weakness.  Hematological: Negative for adenopathy.  Psychiatric/Behavioral: Negative for confusion.    MEDICAL HISTORY:  Past Medical History:  Diagnosis Date  . Cancer [facility])    pancreatic cancer  . Colon polyps   . Osteopenia after menopause 05/2017   femoral neck T score -2.0    SURGICAL HISTORY: Past Surgical History:  Procedure Laterality Date  . COLONOSCOPY  07/2015   WNL  . COLONOSCOPY  2008/2011  . OVARIAN CYST REMOVAL  1992   dermoid-Dr Bennington  . PORTA CATH INSERTION N/A 03/07/2020   Procedure: PORTA CATH INSERTION;  Surgeon: Algernon Huxley, MD;  Location: [place] [facility];  Service: Cardiovascular;  Laterality: N/A;  . TUBAL LIGATION  1993    SOCIAL  HISTORY: Social History   Socioeconomic History  . Marital status: Married    Spouse name: Not on file  . Number of children: 2  . Years of education: Not on file  . Highest education level: Not on file  Occupational History  . Occupation: Pharmacist, hospital    Comment: retired  . Occupation: Farmer  Tobacco Use  . Smoking status: Former Smoker    Packs/day: 0.50    Years: 10.00    Pack years: 5.00    Types: Cigarettes    Quit date: 1987    Years since quitting: 34.7  . Smokeless tobacco: Never Used  . Tobacco comment: Quit smoking 1987  Vaping Use  . Vaping Use: Never used  Substance and Sexual Activity  . Alcohol use: Yes    Comment: 0-2 mixed drinks a day  . Drug use: No  . Sexual activity: Not Currently    Birth control/protection: Post-menopausal  Other Topics Concern  . Not on file  Social History Narrative  . Not on file   Social Determinants of Health   Financial Resource Strain:   . Difficulty of Paying Living Expenses: Not on file  Food Insecurity:   . Worried About Charity fundraiser in the Last Year: Not on file  . Ran Out of Food  in the Last Year: Not on file  Transportation Needs:   . Lack of Transportation (Medical): Not on file  . Lack of Transportation (Non-Medical): Not on file  Physical Activity:   . Days of Exercise per Week: Not on file  . Minutes of Exercise per Session: Not on file  Stress:   . Feeling of Stress : Not on file  Social Connections:   . Frequency of Communication with Friends and Family: Not on file  . Frequency of Social Gatherings with Friends and Family: Not on file  . Attends Religious Services: Not on file  . Active Member of Clubs or Organizations: Not on file  . Attends Archivist Meetings: Not on file  . Marital Status: Not on file  Intimate Partner Violence:   . Fear of Current or Ex-Partner: Not on file  . Emotionally Abused: Not on file  . Physically Abused: Not on file  . Sexually Abused: Not on file     FAMILY HISTORY: Family History  Problem Relation Age of Onset  . Breast cancer Paternal Aunt 33  . Diabetes Mother   . Osteoporosis Mother   . Hyperlipidemia Father   . Rheumatic fever Father   . Valvular heart disease Father   . Colon cancer Paternal Grandmother   . Breast cancer Sister 26    ALLERGIES:  is allergic to penicillin g.  MEDICATIONS:  Current Outpatient Medications  Medication Sig Dispense Refill  . Cholecalciferol 25 MCG (1000 UT) tablet Take 2,000 Units by mouth daily.    . cholestyramine (QUESTRAN) 4 GM/DOSE powder Take by mouth. PRN    . lidocaine-prilocaine (EMLA) cream Apply to affected area once 30 g 3  . Multiple Vitamins-Minerals (MULTIVITAMIN ADULT PO) Take by mouth.    . ondansetron (ZOFRAN) 8 MG tablet Take 1 tablet (8 mg total) by mouth 2 (two) times daily as needed. Start on day 3 after chemotherapy. 30 tablet 1  . prochlorperazine (COMPAZINE) 10 MG tablet Take 1 tablet (10 mg total) by mouth every 6 (six) hours as needed (Nausea or vomiting). 30 tablet 1  . aspirin EC 81 MG tablet Take 1 tablet (81 mg total) by mouth daily. Swallow whole. (Patient not taking: Reported on 03/14/2020) 150 tablet 2  . atorvastatin (LIPITOR) 40 MG tablet Take 1 tablet (40 mg total) by mouth daily. (Patient not taking: Reported on 03/14/2020) 30 tablet 0  . calcium-vitamin D (OSCAL WITH D) 500-200 MG-UNIT TABS tablet Take 1 tablet by mouth daily.  (Patient not taking: Reported on 03/18/2020)    . loperamide (IMODIUM A-D) 2 MG tablet Take 2 at onset of diarrhea, then 1 every 2hrs until 12hr without a BM. May take 2 tab every 4hrs at bedtime. If diarrhea recurs repeat. (Patient not taking: Reported on 03/18/2020) 100 tablet 1  . simethicone (GAS-X) 80 MG chewable tablet Chew 1 tablet (80 mg total) by mouth every 8 (eight) hours as needed for flatulence. 60 tablet 0   No current facility-administered medications for this visit.   Facility-Administered Medications Ordered in  Other Visits  Medication Dose Route Frequency Provider Last Rate Last Admin  . sodium chloride flush (NS) 0.9 % injection 10 mL  10 mL Intravenous PRN [name], MD   10 mL at 03/18/20 1301     PHYSICAL EXAMINATION: ECOG PERFORMANCE STATUS: 1 - Symptomatic but completely ambulatory Vitals:   03/18/20 1313  BP: 115/83  Pulse: (!) 114  Resp: 18  Temp: (!) 101.1 F (38.4 C)  SpO2: 98%   Filed Weights   03/18/20 1313  Weight: 137 lb 9.6 oz (62.4 kg)    Physical Exam Constitutional:      General: She is not in acute distress. HENT:     Head: Normocephalic and atraumatic.  Eyes:     General: No scleral icterus. Cardiovascular:     Rate and Rhythm: Normal rate and regular rhythm.     Heart sounds: Normal heart sounds.  Pulmonary:     Effort: Pulmonary effort is normal. No respiratory distress.     Breath sounds: No wheezing.  Abdominal:     General: Bowel sounds are normal. There is no distension.     Palpations: Abdomen is soft.  Musculoskeletal:        General: No deformity. Normal range of motion.     Cervical back: Normal range of motion and neck supple.  Skin:    General: Skin is warm and dry.     Coloration: Skin is not jaundiced.     Findings: No erythema or rash.  Neurological:     Mental Status: She is alert and oriented to person, place, and time. Mental status is at baseline.     Cranial Nerves: No cranial nerve deficit.     Coordination: Coordination normal.  Psychiatric:        Mood and Affect: Mood normal.     LABORATORY DATA:  I have reviewed the data as listed Lab Results  Component Value Date   WBC 10.3 03/18/2020   HGB 11.9 (L) 03/18/2020   HCT 31.8 (L) 03/18/2020   MCV 86.6 03/18/2020   PLT 256 03/18/2020   Recent Labs    02/06/20 1049 02/29/20 1314 03/14/20 0813 03/18/20 1257 03/18/20 1415  NA 137   < > 134* 132* 132*  K 4.1   < > 3.8 4.0 4.0  CL 99   < > 99 98 98  CO2 23   < > 27 24 24   GLUCOSE 113*   < > 215* 134* 138*  BUN 11    < > 14 12 12   CREATININE 0.80   < > 0.68 0.69 0.81  CALCIUM 10.2   < > 8.4* 8.6* 8.5*  GFRNONAA 78   < > >60 >60 >60  GFRAA 90   < > >60 >60 >60  PROT 7.0   < > 6.2* 6.8 6.7  ALBUMIN 4.9*   < > 3.2* 3.4* 3.6  AST 195*   < > 34 21 20  ALT 366*   < > 49* 37 36  ALKPHOS 934*   < > 133* 123 124  BILITOT 9.9*   < > 1.9* 1.9* 1.7*  BILIDIR 8.11*  --   --   --   --   IBILI 1.79*  --   --   --   --    < > = values in this interval not displayed.   Iron/TIBC/Ferritin/ %Sat No results found for: IRON, TIBC, FERRITIN, IRONPCTSAT    RADIOGRAPHIC STUDIES: I have personally reviewed the radiological images as listed and agreed with the findings in the report. Reviewed findings of MRI abdomen MRCP done at [facility]. CT ANGIO HEAD W OR WO CONTRAST  Result Date: 03/12/2020 CLINICAL DATA:  Slurred speech.  Pancreatic cancer patient. EXAM: CT ANGIOGRAPHY HEAD AND NECK TECHNIQUE: Multidetector CT imaging of the head and neck was performed using the standard protocol during bolus administration of intravenous contrast. Multiplanar CT image reconstructions and MIPs were obtained to evaluate  the vascular anatomy. Carotid stenosis measurements (when applicable) are obtained utilizing NASCET criteria, using the distal internal carotid diameter as the denominator. CONTRAST:  40mL OMNIPAQUE IOHEXOL 350 MG/ML SOLN COMPARISON:  Head CT yesterday. FINDINGS: CT HEAD FINDINGS Brain: No evidence of acute infarction. No accelerated brain atrophy. No mass, hemorrhage, hydrocephalus or extra-axial collection. Vascular: There is atherosclerotic calcification of the major vessels at the base of the brain. Skull: Negative Sinuses: Clear Orbits: Normal Review of the MIP images confirms the above findings CTA NECK FINDINGS Aortic arch: Aortic atherosclerosis. Branching pattern is normal without origin stenosis. Right carotid system: Common carotid artery widely patent to the bifurcation. Calcified plaque at the carotid bifurcation but  no stenosis. ICA bulb widely patent. Cervical ICA is normal. Left carotid system: Common carotid artery widely patent to the bifurcation. Carotid bifurcation is normal without plaque or stenosis. Cervical ICA is normal. Vertebral arteries: Both vertebral arteries widely patent at their origins and through the cervical region to the foramen magnum. Skeleton: Ordinary cervical spondylosis. Other neck: No mass or lymphadenopathy. Upper chest: Normal Review of the MIP images confirms the above findings CTA HEAD FINDINGS Anterior circulation: Both internal carotid arteries widely patent through the skull base and siphon regions. Ordinary siphon atherosclerotic calcification but without stenosis greater than 30%. The anterior and middle cerebral vessels are normal without proximal stenosis, aneurysm or vascular malformation. No large or medium vessel occlusion. Posterior circulation: Both vertebral arteries widely patent to the basilar. No basilar stenosis. Posterior circulation branch vessels are normal. Venous sinuses: Patent and normal. Anatomic variants: None significant. Review of the MIP images confirms the above findings IMPRESSION: 1. Mild atherosclerotic disease at the right carotid bifurcation but without stenosis. Left carotid bifurcation normal. 2. No intracranial large or medium vessel occlusion or correctable proximal stenosis. 3. Aortic atherosclerosis. Aortic Atherosclerosis (ICD10-I70.0). Electronically Signed   By: Nelson Chimes M.D.   On: 03/12/2020 10:46   CT HEAD WO CONTRAST  Result Date: 03/11/2020 CLINICAL DATA:  Slurred speech EXAM: CT HEAD WITHOUT CONTRAST TECHNIQUE: Contiguous axial images were obtained from the base of the skull through the vertex without intravenous contrast. COMPARISON:  None. FINDINGS: Brain: No evidence of acute territorial infarction, hemorrhage, hydrocephalus,extra-axial collection or mass lesion/mass effect. Normal gray-white differentiation. Ventricles are normal in  size and contour. Vascular: No hyperdense vessel or unexpected calcification. Skull: The skull is intact. No fracture or focal lesion identified. Sinuses/Orbits: The visualized paranasal sinuses and mastoid air cells are clear. The orbits and globes intact. Other: None IMPRESSION: No acute intracranial abnormality. Electronically Signed   By: Prudencio Pair M.D.   On: 03/11/2020 16:48   CT ANGIO NECK W OR WO CONTRAST  Result Date: 03/12/2020 CLINICAL DATA:  Slurred speech.  Pancreatic cancer patient. EXAM: CT ANGIOGRAPHY HEAD AND NECK TECHNIQUE: Multidetector CT imaging of the head and neck was performed using the standard protocol during bolus administration of intravenous contrast. Multiplanar CT image reconstructions and MIPs were obtained to evaluate the vascular anatomy. Carotid stenosis measurements (when applicable) are obtained utilizing NASCET criteria, using the distal internal carotid diameter as the denominator. CONTRAST:  54mL OMNIPAQUE IOHEXOL 350 MG/ML SOLN COMPARISON:  Head CT yesterday. FINDINGS: CT HEAD FINDINGS Brain: No evidence of acute infarction. No accelerated brain atrophy. No mass, hemorrhage, hydrocephalus or extra-axial collection. Vascular: There is atherosclerotic calcification of the major vessels at the base of the brain. Skull: Negative Sinuses: Clear Orbits: Normal Review of the MIP images confirms the above findings CTA NECK FINDINGS Aortic arch:  Aortic atherosclerosis. Branching pattern is normal without origin stenosis. Right carotid system: Common carotid artery widely patent to the bifurcation. Calcified plaque at the carotid bifurcation but no stenosis. ICA bulb widely patent. Cervical ICA is normal. Left carotid system: Common carotid artery widely patent to the bifurcation. Carotid bifurcation is normal without plaque or stenosis. Cervical ICA is normal. Vertebral arteries: Both vertebral arteries widely patent at their origins and through the cervical region to the  foramen magnum. Skeleton: Ordinary cervical spondylosis. Other neck: No mass or lymphadenopathy. Upper chest: Normal Review of the MIP images confirms the above findings CTA HEAD FINDINGS Anterior circulation: Both internal carotid arteries widely patent through the skull base and siphon regions. Ordinary siphon atherosclerotic calcification but without stenosis greater than 30%. The anterior and middle cerebral vessels are normal without proximal stenosis, aneurysm or vascular malformation. No large or medium vessel occlusion. Posterior circulation: Both vertebral arteries widely patent to the basilar. No basilar stenosis. Posterior circulation branch vessels are normal. Venous sinuses: Patent and normal. Anatomic variants: None significant. Review of the MIP images confirms the above findings IMPRESSION: 1. Mild atherosclerotic disease at the right carotid bifurcation but without stenosis. Left carotid bifurcation normal. 2. No intracranial large or medium vessel occlusion or correctable proximal stenosis. 3. Aortic atherosclerosis. Aortic Atherosclerosis (ICD10-I70.0). Electronically Signed   By: Nelson Chimes M.D.   On: 03/12/2020 10:46   CT CHEST W CONTRAST  Result Date: 03/13/2020 CLINICAL DATA:  64 year old female with history of pancreatic cancer. EXAM: CT CHEST WITH CONTRAST TECHNIQUE: Multidetector CT imaging of the chest was performed during intravenous contrast administration. CONTRAST:  22mL OMNIPAQUE IOHEXOL 300 MG/ML  SOLN COMPARISON:  No priors. FINDINGS: Cardiovascular: Heart size is normal. There is no significant pericardial fluid, thickening or pericardial calcification. 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 No definite coronary artery calcifications. Right internal jugular single-lumen porta cath with tip terminating in the superior aspect of the right atrium. Mediastinum/Nodes: No pathologically enlarged mediastinal or hilar lymph nodes.  Esophagus is unremarkable in appearance. No axillary lymphadenopathy. Lungs/Pleura: No suspicious appearing pulmonary nodules or masses are noted. No acute consolidative airspace disease. No pleural effusions. Upper Abdomen: Extensive pneumobilia. Musculoskeletal: There are no aggressive appearing lytic or blastic lesions noted in the visualized portions of the skeleton. IMPRESSION: 1. No findings to suggest metastatic disease to the thorax. 2. 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 Recommend annual imaging followup by CTA or MRA. This recommendation follows 2010 ACCF/AHA/AATS/ACR/ASA/SCA/SCAI/SIR/STS/SVM Guidelines for the Diagnosis and Management of Patients with Thoracic Aortic Disease. Circulation. 2010; 121: A165-V374. Aortic aneurysm NOS (ICD10-I71.9). Aortic Atherosclerosis (ICD10-I70.0). Electronically Signed   By: Vinnie Langton M.D.   On: 03/13/2020 11:59   MR BRAIN WO CONTRAST  Result Date: 03/12/2020 CLINICAL DATA:  Transit ischemic attack.  Slurred speech. EXAM: MRI HEAD WITHOUT CONTRAST TECHNIQUE: Multiplanar, multiecho pulse sequences of the brain and surrounding structures were obtained without intravenous contrast. COMPARISON:  03/11/2020 head CT.  03/12/2020 CTA head and neck. FINDINGS: Brain: No acute infarct or intracranial hemorrhage. Cerebral volume is within normal limits. Minimal chronic microvascular ischemic changes. No midline shift, ventriculomegaly or extra-axial fluid collection. No mass lesion. Vascular: Please see same day CTA. Skull and upper cervical spine: Normal marrow signal. Sinuses/Orbits: Normal orbits. Clear paranasal sinuses. No mastoid effusion. Other: None. IMPRESSION: No acute intracranial process. Minimal chronic microvascular ischemic changes. Electronically Signed   By: Primitivo Gauze M.D.   On:  03/12/2020 11:49   US Carotid Bilateral (at [facility] and [facility] only)  Result Date: 03/12/2020 CLINICAL DATA:   65 year old female with a history of TIA EXAM: BILATERAL CAROTID DUPLEX ULTRASOUND TECHNIQUE: Pearline Cables scale imaging, color Doppler and duplex ultrasound were performed of bilateral carotid and vertebral arteries in the neck. COMPARISON:  None. FINDINGS: Criteria: Quantification of carotid stenosis is based on velocity parameters that correlate the residual internal carotid diameter with NASCET-based stenosis levels, using the diameter of the distal internal carotid lumen as the denominator for stenosis measurement. The following velocity measurements were obtained: RIGHT ICA:  Systolic 893 cm/sec, Diastolic 52 cm/sec CCA:  98 cm/sec SYSTOLIC ICA/CCA RATIO:  1.6 ECA:  170 cm/sec LEFT ICA:  Systolic 89 cm/sec, Diastolic 14 cm/sec CCA:  95 cm/sec SYSTOLIC ICA/CCA RATIO:  0.9 ECA:  119 cm/sec Right Brachial SBP: Not acquired Left Brachial SBP: Not acquired RIGHT CAROTID ARTERY: No significant calcifications of the right common carotid artery. Intermediate waveform maintained. Heterogeneous and partially calcified plaque at the right carotid bifurcation. No significant lumen shadowing. Low resistance waveform of the right ICA. No significant tortuosity. RIGHT VERTEBRAL ARTERY: Antegrade flow with low resistance waveform. LEFT CAROTID ARTERY: No significant calcifications of the left common carotid artery. Intermediate waveform maintained. Heterogeneous and partially calcified plaque at the left carotid bifurcation without significant lumen shadowing. Low resistance waveform of the left ICA. No significant tortuosity. LEFT VERTEBRAL ARTERY:  Antegrade flow with low resistance waveform. IMPRESSION: Right: Heterogeneous and partially calcified plaque at the right carotid bifurcation, with discordant results regarding degree of stenosis by established duplex criteria. Peak velocity suggests 50%-69% stenosis, with the ICA/ CCA ratio suggesting a lesser degree of stenosis. If establishing a more accurate degree of stenosis is  required, cerebral angiogram should be considered, or as a second best test, CTA. Left: Color duplex indicates minimal heterogeneous and calcified plaque, with no hemodynamically significant stenosis by duplex criteria in the extracranial cerebrovascular circulation. Signed, Dulcy Fanny. Dellia Nims, RPVI [facility] Electronically Signed   By: Corrie Mckusick D.O.   On: 03/12/2020 14:02   PERIPHERAL VASCULAR CATHETERIZATION  Result Date: 03/07/2020 See op note  ECHOCARDIOGRAM COMPLETE  Result Date: 03/12/2020    ECHOCARDIOGRAM REPORT   Patient Name:   DORISTINE SHEHAN Date of Exam: 03/12/2020 Medical Rec #:  [id number]   Height:       67.0 in Accession #:    [id number]  Weight:       145.3 lb Date of Birth:  1955-10-19   BSA:          1.765 m Patient Age:    26 years    BP:           125/74 mmHg Patient Gender: F           HR:           63 bpm. Exam Location:  [facility] Procedure: 2D Echo, Cardiac Doppler and Color Doppler Indications:     TIA 435.9  History:         Patient has no prior history of Echocardiogram examinations. No                  cardiac hsitory listed.  Sonographer:     Sherrie Sport RDCS (AE) Referring Phys:  [id number] Sidney Ace Diagnosing Phys: Kate Sable MD IMPRESSIONS  1. Left ventricular ejection fraction, by estimation, is 65 to 70%. The left ventricle has normal function. The left ventricle has  no regional wall motion abnormalities. Left ventricular diastolic parameters were normal.  2. Right ventricular systolic function is normal. The right ventricular size is normal. There is normal pulmonary artery systolic pressure.  3. The mitral valve is normal in structure. No evidence of mitral valve regurgitation. No evidence of mitral stenosis.  4. The aortic valve is normal in structure. Aortic valve regurgitation is not visualized. No aortic stenosis is present.  5. The inferior vena cava is normal in size with greater than 50%  respiratory variability, suggesting right atrial pressure of 3 mmHg. FINDINGS  Left Ventricle: Left ventricular ejection fraction, by estimation, is 65 to 70%. The left ventricle has normal function. The left ventricle has no regional wall motion abnormalities. The left ventricular internal cavity size was normal in size. There is  no left ventricular hypertrophy. Left ventricular diastolic parameters were normal. Right Ventricle: The right ventricular size is normal. No increase in right ventricular wall thickness. Right ventricular systolic function is normal. There is normal pulmonary artery systolic pressure. The tricuspid regurgitant velocity is 2.57 m/s, and  with an assumed right atrial pressure of 3 mmHg, the estimated right ventricular systolic pressure is 68.3 mmHg. Left Atrium: Left atrial size was normal in size. Right Atrium: Right atrial size was normal in size. Pericardium: There is no evidence of pericardial effusion. Mitral Valve: The mitral valve is normal in structure. No evidence of mitral valve regurgitation. No evidence of mitral valve stenosis. Tricuspid Valve: The tricuspid valve is normal in structure. Tricuspid valve regurgitation is not demonstrated. No evidence of tricuspid stenosis. Aortic Valve: The aortic valve is normal in structure. Aortic valve regurgitation is not visualized. No aortic stenosis is present. Aortic valve mean gradient measures 4.5 mmHg. Aortic valve peak gradient measures 7.3 mmHg. Aortic valve area, by VTI measures 3.23 cm. Pulmonic Valve: The pulmonic valve was normal in structure. Pulmonic valve regurgitation is not visualized. No evidence of pulmonic stenosis. Aorta: The aortic root is normal in size and structure. Venous: The inferior vena cava is normal in size with greater than 50% respiratory variability, suggesting right atrial pressure of 3 mmHg. IAS/Shunts: No atrial level shunt detected by color flow Doppler.  LEFT VENTRICLE PLAX 2D LVIDd:         4.00  cm  Diastology LVIDs:         2.28 cm  LV e' medial:    7.72 cm/s LV PW:         1.50 cm  LV E/e' medial:  10.8 LV IVS:        0.90 cm  LV e' lateral:   11.50 cm/s LVOT diam:     2.00 cm  LV E/e' lateral: 7.3 LV SV:         96 LV SV Index:   54 LVOT Area:     3.14 cm  RIGHT VENTRICLE RV Basal diam:  3.77 cm RV S prime:     19.00 cm/s TAPSE (M-mode): 3.7 cm LEFT ATRIUM             Index       RIGHT ATRIUM           Index LA diam:        2.30 cm 1.30 cm/m  RA Area:     16.70 cm LA Vol (A2C):   45.2 ml 25.60 ml/m RA Volume:   45.40 ml  25.72 ml/m LA Vol (A4C):   35.1 ml 19.88 ml/m LA Biplane Vol: 39.1 ml 22.15 ml/m  AORTIC VALVE                   PULMONIC VALVE AV Area (Vmax):    3.22 cm    PV Vmax:        0.79 m/s AV Area (Vmean):   3.07 cm    PV Peak grad:   2.5 mmHg AV Area (VTI):     3.23 cm    RVOT Peak grad: 4 mmHg AV Vmax:           135.50 cm/s AV Vmean:          99.250 cm/s AV VTI:            0.297 m AV Peak Grad:      7.3 mmHg AV Mean Grad:      4.5 mmHg LVOT Vmax:         139.00 cm/s LVOT Vmean:        97.100 cm/s LVOT VTI:          0.305 m LVOT/AV VTI ratio: 1.03  AORTA Ao Root diam: 3.50 cm MITRAL VALVE               TRICUSPID VALVE MV Area (PHT): 2.39 cm    TR Peak grad:   26.4 mmHg MV Decel Time: 317 msec    TR Vmax:        257.00 cm/s MV E velocity: 83.40 cm/s MV A velocity: 72.30 cm/s  SHUNTS MV E/A ratio:  1.15        Systemic VTI:  0.30 m                            Systemic Diam: 2.00 cm Kate Sable MD Electronically signed by Kate Sable MD Signature Date/Time: 03/12/2020/4:01:51 PM    Final      ASSESSMENT & PLAN:  1. Fever, unspecified   2. Primary pancreatic cancer  (HCC)   3. Bloating   4. Obstructive jaundice   5. Weight loss   6. 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 Status post cycle 1 FOLFIRINOX.  Patient received oxaliplatin and about 50% of Irinotecan on day 1 and had experienced neurologic symptoms.  She went to ER and was admitted for TIA.  Her symptoms has all resolved. Patient then had 5-FU pump which was discussed elected yesterday.  Labs are reviewed and discussed with patient. Hyponatremia, likely secondary to dehydration due to decreased oral intake.  Patient will receive IV fluid normal saline 1 L x 1 today. Decreased oral intake, recommend patient to continue nutrition supplementation.  Increase oral hydration. Continue utilize antiemetics.  Abdominal discomfort/bloating, abdominal examination is benign.  This may be secondary to irinotecan side effects. She feels better after 1 L of IV fluid. I also suggest patient to try Gas-X for her symptoms.  #Obstructive jaundice status post biliary stent placement. Her bilirubin is stable at 1.7, indicating functioning biliary stent.  #Fever, etiology unknown.  Patient does not have respiratory or urinary symptoms.  UA showed a very small amount of leukocyte, negative for nitrate.  Urine culture is pending.  I also obtain blood culture with 2 sets.  Results are pending.  Fever related to malignancy is also possible.  She does not  appear toxic currently.  Feels better after receiving IV hydration. I recommend patient to continue monitor her temperature at home.  If she persistently spikes fever, I recommend patient to go to emergency room for further evaluation.  She is immunocompromised, potentially also at risk at cholangitis due to the history of stent placement.  Patient agrees with the plan.  #Weight loss, refer to nutritionist.  Continue nutrition supplementation. Supportive care measures are necessary for patient well-being and will be provided as necessary. We spent sufficient time to discuss many aspect of care, questions were answered to patient's  satisfaction.   All questions were answered. The patient knows to call the clinic with any problems questions or concerns.  Return of visit: Repeat IV fluid session tomorrow.  Keep other appointment as scheduled.   [name], MD, PhD Hematology Oncology [facility] at [facility] Pager- [phone] 03/18/2020

## 2020-03-18 NOTE — Telephone Encounter (Signed)
Patient called this morning reporting that she had abdominal pain and bloating all night and would like a return call to discuss next steps about this issue.

## 2020-03-18 NOTE — Telephone Encounter (Signed)
Patient is getting scheduled for lab/MD/IVF in infusion today

## 2020-03-19 ENCOUNTER — Inpatient Hospital Stay
Admission: EM | Admit: 2020-03-19 | Discharge: 2020-03-21 | DRG: 871 | Disposition: A | Discharge status: Home Health | Payer: BC Managed Care – PPO | Source: Ambulatory Visit | Attending: Internal Medicine | Admitting: Internal Medicine

## 2020-03-19 ENCOUNTER — Inpatient Hospital Stay: Payer: BC Managed Care – PPO

## 2020-03-19 ENCOUNTER — Encounter: Payer: Self-pay | Admitting: Oncology

## 2020-03-19 ENCOUNTER — Telehealth: Payer: Self-pay

## 2020-03-19 ENCOUNTER — Encounter: Payer: Self-pay | Admitting: Emergency Medicine

## 2020-03-19 ENCOUNTER — Other Ambulatory Visit: Payer: Self-pay

## 2020-03-19 DIAGNOSIS — Z8262 Family history of osteoporosis: Secondary | ICD-10-CM

## 2020-03-19 DIAGNOSIS — Z800 Family history of malignant neoplasm of digestive organs: Secondary | ICD-10-CM | POA: Diagnosis not present

## 2020-03-19 DIAGNOSIS — Z8673 Personal history of transient ischemic attack (TIA), and cerebral infarction without residual deficits: Secondary | ICD-10-CM

## 2020-03-19 DIAGNOSIS — Z803 Family history of malignant neoplasm of breast: Secondary | ICD-10-CM | POA: Diagnosis not present

## 2020-03-19 DIAGNOSIS — A403 Sepsis due to Streptococcus pneumoniae: Secondary | ICD-10-CM | POA: Diagnosis present

## 2020-03-19 DIAGNOSIS — Z79899 Other long term (current) drug therapy: Secondary | ICD-10-CM | POA: Diagnosis not present

## 2020-03-19 DIAGNOSIS — E871 Hypo-osmolality and hyponatremia: Secondary | ICD-10-CM | POA: Diagnosis present

## 2020-03-19 DIAGNOSIS — Z880 Allergy status to penicillin: Secondary | ICD-10-CM | POA: Diagnosis not present

## 2020-03-19 DIAGNOSIS — A419 Sepsis, unspecified organism: Secondary | ICD-10-CM | POA: Diagnosis not present

## 2020-03-19 DIAGNOSIS — D630 Anemia in neoplastic disease: Secondary | ICD-10-CM | POA: Diagnosis present

## 2020-03-19 DIAGNOSIS — A408 Other streptococcal sepsis: Secondary | ICD-10-CM | POA: Diagnosis not present

## 2020-03-19 DIAGNOSIS — D84821 Immunodeficiency due to drugs: Secondary | ICD-10-CM | POA: Diagnosis present

## 2020-03-19 DIAGNOSIS — C250 Malignant neoplasm of head of pancreas: Secondary | ICD-10-CM | POA: Diagnosis present

## 2020-03-19 DIAGNOSIS — K831 Obstruction of bile duct: Secondary | ICD-10-CM | POA: Diagnosis present

## 2020-03-19 DIAGNOSIS — Z87891 Personal history of nicotine dependence: Secondary | ICD-10-CM

## 2020-03-19 DIAGNOSIS — E860 Dehydration: Secondary | ICD-10-CM | POA: Diagnosis present

## 2020-03-19 DIAGNOSIS — Z833 Family history of diabetes mellitus: Secondary | ICD-10-CM

## 2020-03-19 DIAGNOSIS — Z83438 Family history of other disorder of lipoprotein metabolism and other lipidemia: Secondary | ICD-10-CM | POA: Diagnosis not present

## 2020-03-19 DIAGNOSIS — R7881 Bacteremia: Secondary | ICD-10-CM | POA: Diagnosis present

## 2020-03-19 DIAGNOSIS — G459 Transient cerebral ischemic attack, unspecified: Secondary | ICD-10-CM | POA: Diagnosis present

## 2020-03-19 DIAGNOSIS — B953 Streptococcus pneumoniae as the cause of diseases classified elsewhere: Secondary | ICD-10-CM

## 2020-03-19 DIAGNOSIS — Z8249 Family history of ischemic heart disease and other diseases of the circulatory system: Secondary | ICD-10-CM

## 2020-03-19 DIAGNOSIS — N390 Urinary tract infection, site not specified: Secondary | ICD-10-CM | POA: Diagnosis present

## 2020-03-19 DIAGNOSIS — Z8719 Personal history of other diseases of the digestive system: Secondary | ICD-10-CM | POA: Diagnosis not present

## 2020-03-19 DIAGNOSIS — N3000 Acute cystitis without hematuria: Secondary | ICD-10-CM | POA: Diagnosis not present

## 2020-03-19 DIAGNOSIS — Z20822 Contact with and (suspected) exposure to covid-19: Secondary | ICD-10-CM | POA: Diagnosis present

## 2020-03-19 DIAGNOSIS — C259 Malignant neoplasm of pancreas, unspecified: Secondary | ICD-10-CM | POA: Diagnosis not present

## 2020-03-19 DIAGNOSIS — Z7982 Long term (current) use of aspirin: Secondary | ICD-10-CM | POA: Diagnosis not present

## 2020-03-19 DIAGNOSIS — E785 Hyperlipidemia, unspecified: Secondary | ICD-10-CM | POA: Diagnosis present

## 2020-03-19 HISTORY — DX: Streptococcus pneumoniae as the cause of diseases classified elsewhere: B95.3

## 2020-03-19 HISTORY — DX: Sepsis, unspecified organism: A41.9

## 2020-03-19 LAB — CBC WITH DIFFERENTIAL/PLATELET
Abs Immature Granulocytes: 0.04 10*3/uL (ref 0.00–0.07)
Basophils Absolute: 0 10*3/uL (ref 0.0–0.1)
Basophils Relative: 0 %
Eosinophils Absolute: 0 10*3/uL (ref 0.0–0.5)
Eosinophils Relative: 0 %
HCT: 30.8 % — ABNORMAL LOW (ref 36.0–46.0)
Hemoglobin: 11.4 g/dL — ABNORMAL LOW (ref 12.0–15.0)
Immature Granulocytes: 1 %
Lymphocytes Relative: 9 %
Lymphs Abs: 0.7 10*3/uL (ref 0.7–4.0)
MCH: 33.2 pg (ref 26.0–34.0)
MCHC: 37 g/dL — ABNORMAL HIGH (ref 30.0–36.0)
MCV: 89.8 fL (ref 80.0–100.0)
Monocytes Absolute: 0.4 10*3/uL (ref 0.1–1.0)
Monocytes Relative: 4 %
Neutro Abs: 7 10*3/uL (ref 1.7–7.7)
Neutrophils Relative %: 86 %
Platelets: 191 10*3/uL (ref 150–400)
RBC: 3.43 MIL/uL — ABNORMAL LOW (ref 3.87–5.11)
RDW: 12.6 % (ref 11.5–15.5)
WBC: 8.2 10*3/uL (ref 4.0–10.5)
nRBC: 0 % (ref 0.0–0.2)

## 2020-03-19 LAB — BLOOD CULTURE ID PANEL (REFLEXED) - BCID2

## 2020-03-19 LAB — COMPREHENSIVE METABOLIC PANEL
ALT: 43 U/L (ref 0–44)
AST: 30 U/L (ref 15–41)
Albumin: 3.5 g/dL (ref 3.5–5.0)
Alkaline Phosphatase: 125 U/L (ref 38–126)
Anion gap: 9 (ref 5–15)
BUN: 11 mg/dL (ref 8–23)
CO2: 25 mmol/L (ref 22–32)
Calcium: 8.9 mg/dL (ref 8.9–10.3)
Chloride: 95 mmol/L — ABNORMAL LOW (ref 98–111)
Creatinine, Ser: 0.85 mg/dL (ref 0.44–1.00)
GFR calc Af Amer: >60 mL/min (ref >60)
GFR calc non Af Amer: >60 mL/min (ref >60)
Glucose, Bld: 145 mg/dL — ABNORMAL HIGH (ref 70–99)
Potassium: 3.8 mmol/L (ref 3.5–5.1)
Sodium: 129 mmol/L — ABNORMAL LOW (ref 135–145)
Total Bilirubin: 1.5 mg/dL — ABNORMAL HIGH (ref 0.3–1.2)
Total Protein: 7.1 g/dL (ref 6.5–8.1)

## 2020-03-19 LAB — SODIUM, URINE, RANDOM: Sodium, Ur: 81 mmol/L

## 2020-03-19 LAB — APTT: aPTT: 33 seconds (ref 24–36)

## 2020-03-19 LAB — PROCALCITONIN: Procalcitonin: 0.55 ng/mL

## 2020-03-19 LAB — URINE CULTURE

## 2020-03-19 LAB — PROTIME-INR
INR: 1.3 — ABNORMAL HIGH (ref 0.8–1.2)
Prothrombin Time: 15.3 seconds — ABNORMAL HIGH (ref 11.4–15.2)

## 2020-03-19 LAB — OSMOLALITY: Osmolality: 271 mOsm/kg — ABNORMAL LOW (ref 275–295)

## 2020-03-19 LAB — LACTIC ACID, PLASMA: Lactic Acid, Venous: 1.2 mmol/L (ref 0.5–1.9)

## 2020-03-19 LAB — SARS CORONAVIRUS 2 BY RT PCR (HOSPITAL ORDER, PERFORMED IN ~~LOC~~ HOSPITAL LAB): SARS Coronavirus 2: NEGATIVE

## 2020-03-19 LAB — OSMOLALITY, URINE: Osmolality, Ur: 383 mOsm/kg (ref 300–900)

## 2020-03-19 IMAGING — CR DG CHEST 2V
2 series · 2 of 2 positions shown · non-contrast
Comparison: Prior chest CT [DATE].

CLINICAL DATA: Bacteremia, evaluate for pneumonia.

EXAM:
CHEST - 2 VIEW

[chest pa]
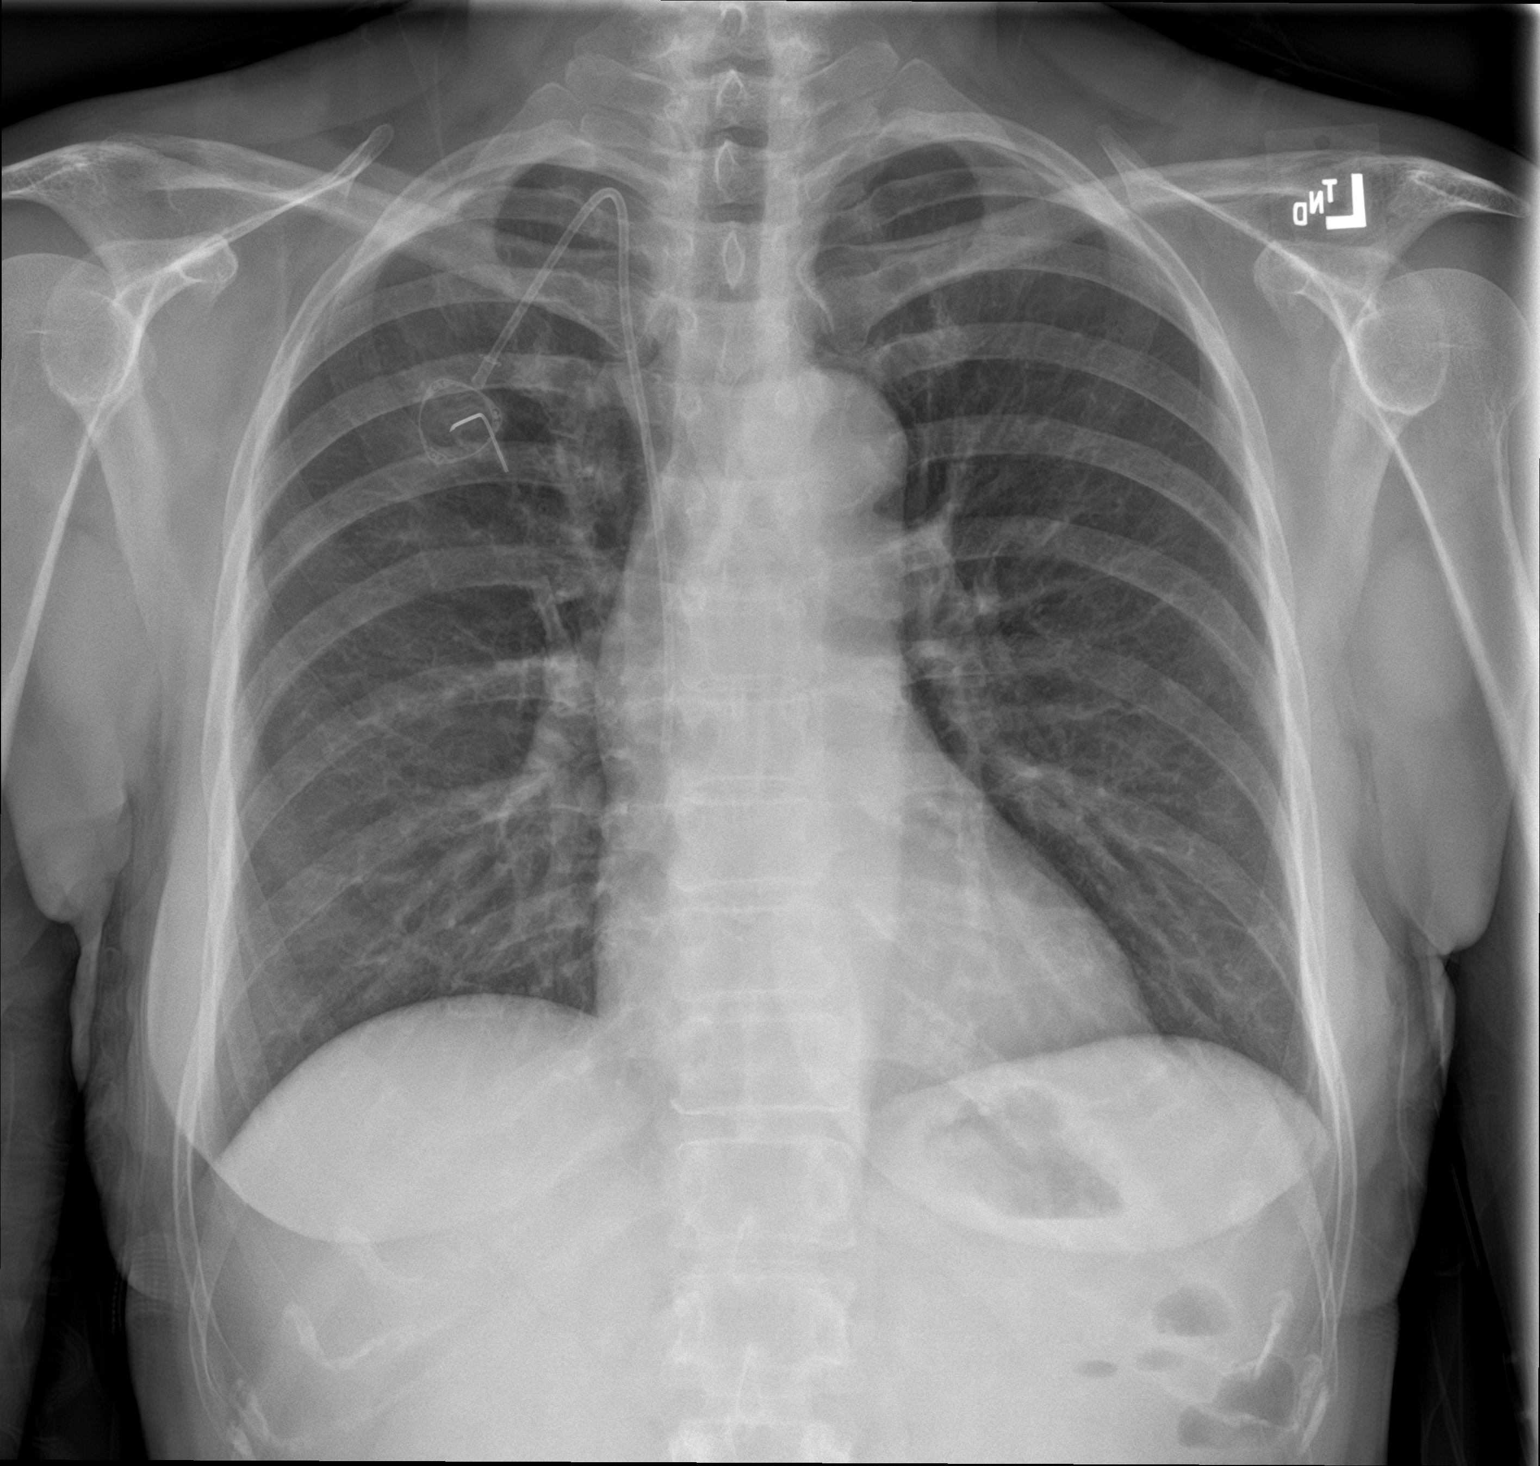

[chest lat]
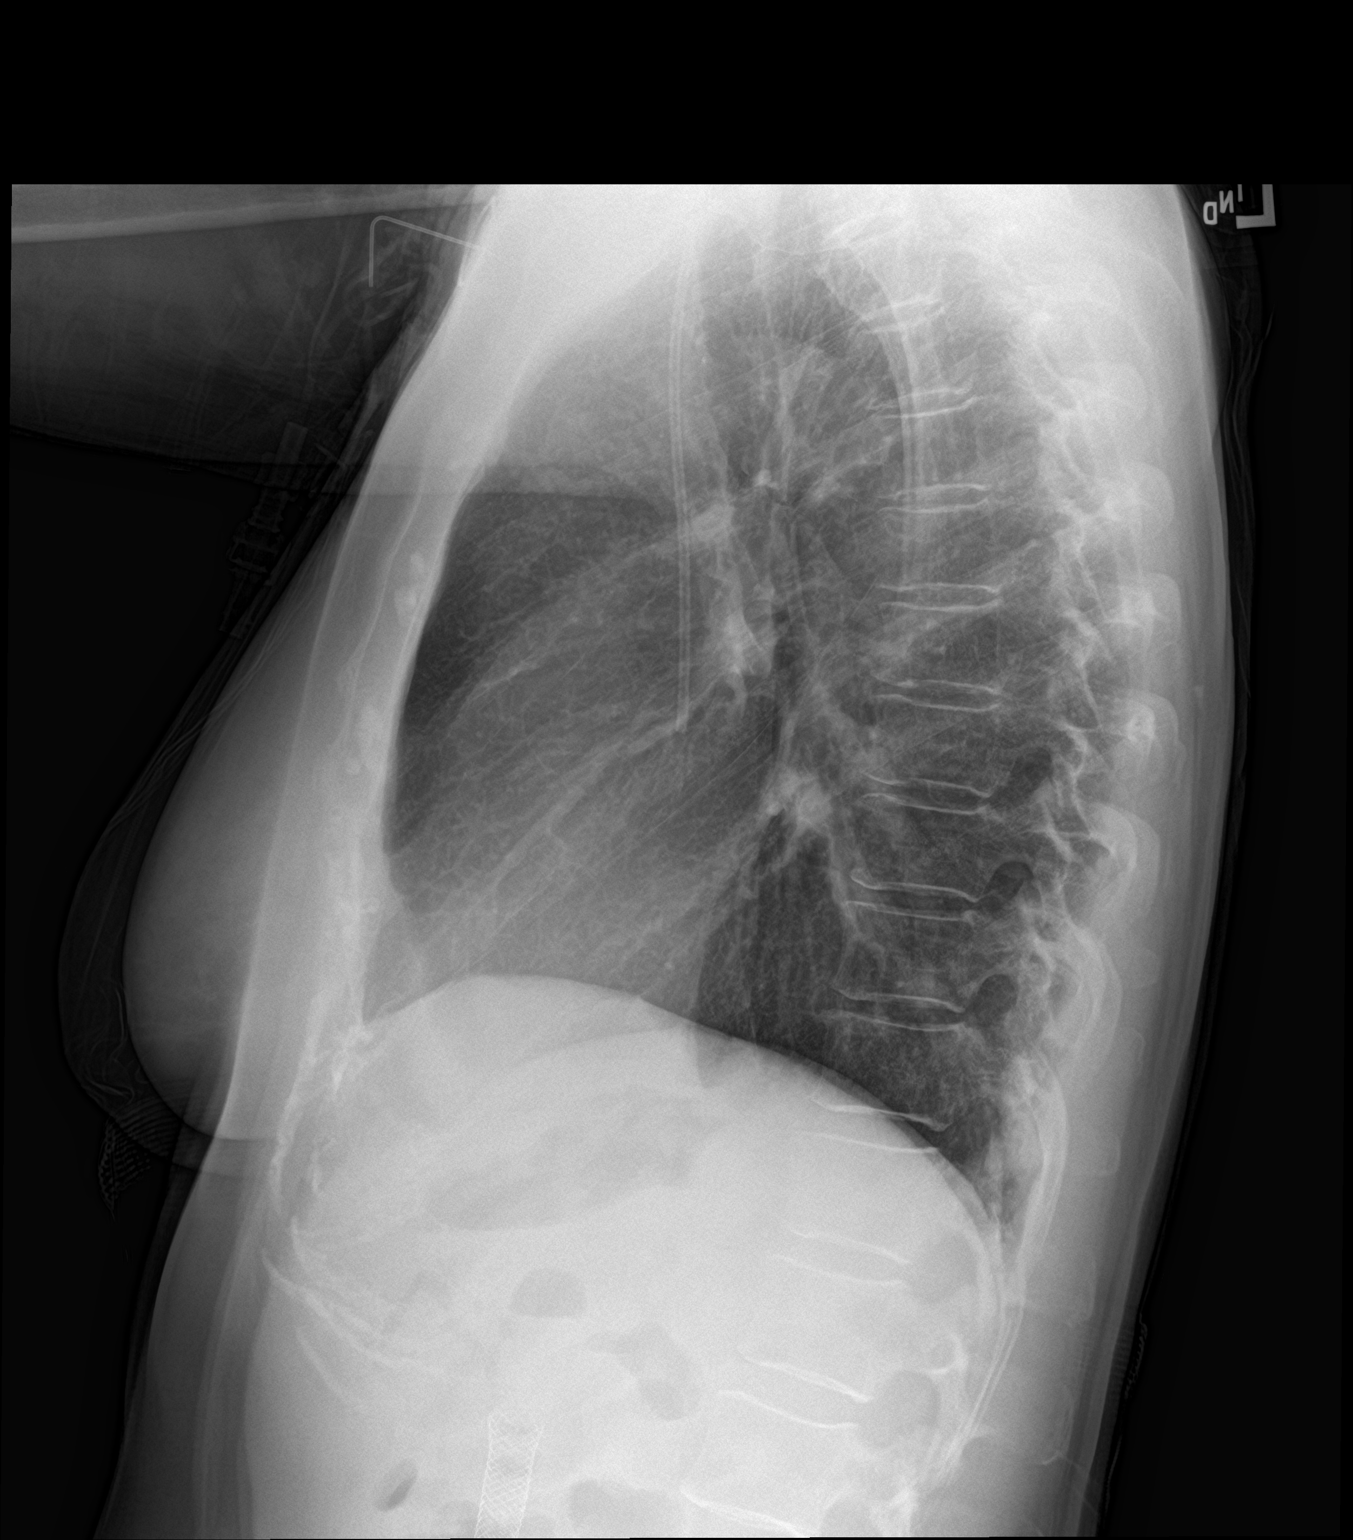

[2 of 2 positions shown; findings below may reference images not displayed]

FINDINGS: A right chest infusion port catheter is present with tip terminating
in the region of the superior cavoatrial junction.

Heart size within normal limits. Aortic atherosclerosis. No
appreciable airspace consolidation. No evidence of pleural effusion
or pneumothorax. No acute bony abnormality identified.
IMPRESSION: No evidence of active cardiopulmonary disease.

Aortic Atherosclerosis ([EC]-[EC]).

## 2020-03-19 MED ORDER — SODIUM CHLORIDE 0.9 % IV SOLN
1.0000 g | Freq: Once | INTRAVENOUS | Status: AC
  Administered 2020-03-19: 1 g via INTRAVENOUS
  Filled 2020-03-19: qty 10

## 2020-03-19 MED ORDER — LIDOCAINE-PRILOCAINE 2.5-2.5 % EX CREA
1.0000 "application " | TOPICAL_CREAM | CUTANEOUS | Status: DC
  Filled 2020-03-19: qty 5

## 2020-03-19 MED ORDER — SODIUM CHLORIDE 0.9 % IV BOLUS
1000.0000 mL | Freq: Once | INTRAVENOUS | Status: AC
  Administered 2020-03-19: 1000 mL via INTRAVENOUS

## 2020-03-19 MED ORDER — ONDANSETRON HCL 4 MG/2ML IJ SOLN: 4.0000 mg | Freq: Three times a day (TID) | INTRAMUSCULAR | Status: DC | PRN

## 2020-03-19 MED ORDER — CHOLESTYRAMINE 4 G PO PACK
4.0000 g | PACK | Freq: Two times a day (BID) | ORAL | Status: DC
  Filled 2020-03-19 (×5): qty 1

## 2020-03-19 MED ORDER — SODIUM CHLORIDE 0.9 % IV SOLN: 1.0000 g | INTRAVENOUS | Status: DC

## 2020-03-19 MED ORDER — SODIUM CHLORIDE 0.9 % IV SOLN
2.0000 g | INTRAVENOUS | Status: DC
  Administered 2020-03-20 – 2020-03-21 (×2): 2 g via INTRAVENOUS
  Filled 2020-03-19 (×2): qty 20

## 2020-03-19 MED ORDER — ENOXAPARIN SODIUM 40 MG/0.4ML ~~LOC~~ SOLN
40.0000 mg | SUBCUTANEOUS | Status: DC
  Administered 2020-03-19: 40 mg via SUBCUTANEOUS
  Filled 2020-03-19 (×2): qty 0.4

## 2020-03-19 MED ORDER — LOPERAMIDE HCL 2 MG PO CAPS
2.0000 mg | ORAL_CAPSULE | ORAL | Status: DC | PRN
  Administered 2020-03-19: 2 mg via ORAL
  Filled 2020-03-19 (×2): qty 1

## 2020-03-19 MED ORDER — ADULT MULTIVITAMIN W/MINERALS CH
1.0000 | ORAL_TABLET | Freq: Every day | ORAL | Status: DC
  Administered 2020-03-20 – 2020-03-21 (×2): 1 via ORAL
  Filled 2020-03-19 (×2): qty 1

## 2020-03-19 MED ORDER — VITAMIN D 25 MCG (1000 UNIT) PO TABS
2000.0000 [IU] | ORAL_TABLET | Freq: Every day | ORAL | Status: DC
  Administered 2020-03-20 – 2020-03-21 (×2): 2000 [IU] via ORAL
  Filled 2020-03-19 (×2): qty 2

## 2020-03-19 MED ORDER — ACETAMINOPHEN 325 MG PO TABS
650.0000 mg | ORAL_TABLET | Freq: Four times a day (QID) | ORAL | Status: DC | PRN
  Administered 2020-03-19 (×2): 650 mg via ORAL
  Filled 2020-03-19 (×2): qty 2

## 2020-03-19 MED ORDER — SIMETHICONE 80 MG PO CHEW
80.0000 mg | CHEWABLE_TABLET | Freq: Three times a day (TID) | ORAL | Status: DC | PRN
  Filled 2020-03-19: qty 1

## 2020-03-19 MED ORDER — SODIUM CHLORIDE 0.9 % IV SOLN: INTRAVENOUS | Status: DC

## 2020-03-19 NOTE — ED Triage Notes (Signed)
Sent over from Cancer center  With abnormal blood culture  States she needs admission and IV antibiotics

## 2020-03-19 NOTE — ED Provider Notes (Signed)
[facility] Emergency Department Provider Note    First MD Initiated Contact with Patient 03/19/20 [phone]     (approximate)  I have reviewed the triage vital signs and the nursing notes.   HISTORY  Chief Complaint Abnormal Lab and Fatigue    HPI Cassandra [name] is a 64 y.o. female recently initiated chemotherapy for pancreatic cancer presents to the ER due to positive blood cultures drawn yesterday.  Has been feeling unwell for the past week.  Was having fever in infusion clinic yesterday.  Was called to go to the ER for admission for IV antibiotics today.  No fever but has had generalized weakness and fatigue.    Past Medical History:  Diagnosis Date  . Cancer [facility])    pancreatic cancer  . Colon polyps   . Osteopenia after menopause 05/2017   femoral neck T score -2.0   Family History  Problem Relation Age of Onset  . Breast cancer Paternal Aunt 10  . Diabetes Mother   . Osteoporosis Mother   . Hyperlipidemia Father   . Rheumatic fever Father   . Valvular heart disease Father   . Colon cancer Paternal Grandmother   . Breast cancer Sister 57   Past Surgical History:  Procedure Laterality Date  . COLONOSCOPY  07/2015   WNL  . COLONOSCOPY  2008/2011  . OVARIAN CYST REMOVAL  1992   dermoid-Dr [place]  . PORTA CATH INSERTION N/A 03/07/2020   Procedure: PORTA CATH INSERTION;  Surgeon: Algernon Huxley, MD;  Location: [facility];  Service: Cardiovascular;  Laterality: N/A;  . TUBAL LIGATION  1993   Patient Active Problem List   Diagnosis Date Noted  . Encounter for antineoplastic chemotherapy 03/11/2020  . TIA (transient ischemic attack) 03/11/2020  . Primary pancreatic cancer  ([place]) 02/29/2020  . Family history of cancer 02/29/2020  . Goals of care, counseling/discussion 02/29/2020  . Obstructive jaundice 02/29/2020  . Plantar fasciitis, right 09/19/2019  . Varicose veins of both lower extremities with inflammation 09/19/2019  . Pure  hypercholesterolemia 09/17/2018  . Osteopenia after menopause 09/11/2017  . Postmenopausal 05/12/2017      Prior to Admission medications   Medication Sig Start Date End Date Taking? Authorizing Provider  aspirin EC 81 MG tablet Take 1 tablet (81 mg total) by mouth daily. Swallow whole. Patient not taking: Reported on 03/14/2020 03/12/20 03/12/21  Sidney Ace, MD  atorvastatin (LIPITOR) 40 MG tablet Take 1 tablet (40 mg total) by mouth daily. Patient not taking: Reported on 03/14/2020 03/13/20 04/12/20  Sidney Ace, MD  calcium-vitamin D (OSCAL WITH D) 500-200 MG-UNIT TABS tablet Take 1 tablet by mouth daily.  Patient not taking: Reported on 03/18/2020    [provider]  Cholecalciferol 25 MCG (1000 UT) tablet Take 2,000 Units by mouth daily.    [provider]  cholestyramine Lucrezia Starch) 4 GM/DOSE powder Take by mouth. PRN 03/14/20   [provider]  lidocaine-prilocaine (EMLA) cream Apply to affected area once 02/29/20   [name], MD  loperamide (IMODIUM A-D) 2 MG tablet Take 2 at onset of diarrhea, then 1 every 2hrs until 12hr without a BM. May take 2 tab every 4hrs at bedtime. If diarrhea recurs repeat. Patient not taking: Reported on 03/18/2020 02/29/20   [name], MD  Multiple Vitamins-Minerals (MULTIVITAMIN ADULT PO) Take by mouth.    [provider]  ondansetron (ZOFRAN) 8 MG tablet Take 1 tablet (8 mg total) by mouth 2 (two)  times daily as needed. Start on day 3 after chemotherapy. 02/29/20   [name], MD  prochlorperazine (COMPAZINE) 10 MG tablet Take 1 tablet (10 mg total) by mouth every 6 (six) hours as needed (Nausea or vomiting). 02/29/20   [name], MD  simethicone (GAS-X) 80 MG chewable tablet Chew 1 tablet (80 mg total) by mouth every 8 (eight) hours as needed for flatulence. 03/18/20   [name], MD    Allergies Penicillin g    Social History Social History   Tobacco Use  . Smoking status: Former Smoker    Packs/day: 0.50     Years: 10.00    Pack years: 5.00    Types: Cigarettes    Quit date: 1987    Years since quitting: 34.7  . Smokeless tobacco: Never Used  . Tobacco comment: Quit smoking 1987  Vaping Use  . Vaping Use: Never used  Substance Use Topics  . Alcohol use: Not Currently    Comment: 0-2 mixed drinks a day  . Drug use: No    Review of Systems Patient denies headaches, rhinorrhea, blurry vision, numbness, shortness of breath, chest pain, edema, cough, abdominal pain, nausea, vomiting, diarrhea, dysuria, fevers, rashes or hallucinations unless otherwise stated above in HPI. ____________________________________________   PHYSICAL EXAM:  VITAL SIGNS: Vitals:   03/19/20 0918 03/19/20 0922  BP: 120/84   Pulse: 95   Resp:  18  Temp: 98.3 F (36.8 C)   SpO2: 98%     Constitutional: Alert and oriented.  Eyes: Conjunctivae are normal.  Head: Atraumatic. Nose: No congestion/rhinnorhea. Mouth/Throat: Mucous membranes are moist.   Neck: No stridor. Painless ROM.  Cardiovascular: Normal rate, regular rhythm. Grossly normal heart sounds.  Good peripheral circulation. Respiratory: Normal respiratory effort.  No retractions. Lungs CTAB. Gastrointestinal: Soft and nontender. No distention. No abdominal bruits. No CVA tenderness. Genitourinary:  Musculoskeletal: No lower extremity tenderness nor edema.  No joint effusions. Neurologic:  Normal speech and language. No gross focal neurologic deficits are appreciated. No facial droop Skin:  Skin is warm, dry and intact. No rash noted. Psychiatric: Mood and affect are normal. Speech and behavior are normal.  ____________________________________________   LABS (all labs ordered are listed, but only abnormal results are displayed)  Results for orders placed or performed in visit on 03/18/20 (from the past 24 hour(s))  Comprehensive metabolic panel     Status: Abnormal   Collection Time: 03/18/20 12:57 PM  Result Value Ref Range   Sodium 132 (L)  135 - 145 mmol/L   Potassium 4.0 3.5 - 5.1 mmol/L   Chloride 98 98 - 111 mmol/L   CO2 24 22 - 32 mmol/L   Glucose, Bld 134 (H) 70 - 99 mg/dL   BUN 12 8 - 23 mg/dL   Creatinine, Ser 0.69 0.44 - 1.00 mg/dL   Calcium 8.6 (L) 8.9 - 10.3 mg/dL   Total Protein 6.8 6.5 - 8.1 g/dL   Albumin 3.4 (L) 3.5 - 5.0 g/dL   AST 21 15 - 41 U/L   ALT 37 0 - 44 U/L   Alkaline Phosphatase 123 38 - 126 U/L   Total Bilirubin 1.9 (H) 0.3 - 1.2 mg/dL   GFR calc non Af Amer >60 >60 mL/min   GFR calc Af Amer >60 >60 mL/min   Anion gap 10 5 - 15  CBC with Differential     Status: Abnormal   Collection Time: 03/18/20 12:57 PM  Result Value Ref Range   WBC 10.3 4.0 -  10.5 K/uL   RBC 3.67 (L) 3.87 - 5.11 MIL/uL   Hemoglobin 11.9 (L) 12.0 - 15.0 g/dL   HCT 31.8 (L) 36 - 46 %   MCV 86.6 80.0 - 100.0 fL   MCH 32.4 26.0 - 34.0 pg   MCHC 37.4 (H) 30.0 - 36.0 g/dL   RDW 12.5 11.5 - 15.5 %   Platelets 256 150 - 400 K/uL   nRBC 0.0 0.0 - 0.2 %   Neutrophils Relative % 81 %   Neutro Abs 8.5 (H) 1.7 - 7.7 K/uL   Lymphocytes Relative 12 %   Lymphs Abs 1.2 0.7 - 4.0 K/uL   Monocytes Relative 5 %   Monocytes Absolute 0.5 0 - 1 K/uL   Eosinophils Relative 1 %   Eosinophils Absolute 0.1 0 - 0 K/uL   Basophils Relative 0 %   Basophils Absolute 0.0 0 - 0 K/uL   Immature Granulocytes 1 %   Abs Immature Granulocytes 0.08 (H) 0.00 - 0.07 K/uL   ____________________________________________  __________________________________  RADIOLOGY   ____________________________________________   PROCEDURES  Procedure(s) performed:  Procedures    Critical Care performed: no ____________________________________________   INITIAL IMPRESSION / ASSESSMENT AND PLAN / ED COURSE  Pertinent labs & imaging results that were available during my care of the patient were reviewed by me and considered in my medical decision making (see chart for details).   DDX: Sepsis, bacteremia, electrolyte abnormality, dehydration,  anemia  Cassandra [name] is a 64 y.o. who presents to the ED with presentation as described above.  Patient currently nontoxic-appearing but does appear frail.  Blood cultures grew out strep pneumonia species yesterday.  She is not febrile not tachycardic but does have mild hyponatremia.  Will give IV fluids as well as a dose of IV antibiotics.  Has a listed penicillin allergy from when she was a child but states that she has tolerated Augmentin in the past.  Will give Rocephin.  Will discuss with hospitalist for admission for IV hydration and reassessment.     The patient was evaluated in Emergency Department today for the symptoms described in the history of present illness. He/she was evaluated in the context of the global COVID-19 pandemic, which necessitated consideration that the patient might be at risk for infection with the SARS-CoV-2 virus that causes COVID-19. Institutional protocols and algorithms that pertain to the evaluation of patients at risk for COVID-19 are in a state of rapid change based on information released by regulatory bodies including the CDC and federal and state organizations. These policies and algorithms were followed during the patient's care in the ED.  As part of my medical decision making, I reviewed the following data within the [place] notes reviewed and incorporated, Labs reviewed, notes from prior ED visits and [place] Controlled Substance Database   ____________________________________________   FINAL CLINICAL IMPRESSION(S) / ED DIAGNOSES  Final diagnoses:  Bacteremia      NEW MEDICATIONS STARTED DURING THIS VISIT:  New Prescriptions   No medications on file     Note:  This document was prepared using Dragon voice recognition software and may include unintentional dictation errors.    Merlyn Lot, MD 03/19/20 1126

## 2020-03-19 NOTE — Telephone Encounter (Signed)
Per [place] from Dr. Tasia Catchings: please call pt and adise her that her blood grow bacteria, I advise her to go to ER to be admitted. I called ER and talked to Triage already. Team please give her a call. Thanks.   Patient contacted and notified and she will head over to ER. Appt in [facility] for IVF has been cancelled for today and pt is aware.

## 2020-03-19 NOTE — Consult Note (Addendum)
Hematology/Oncology Consult note [facility] Telephone:([phone] [phone]  Patient Care Team: Flinchum, Kelby Aline, FNP as PCP - General (Family Medicine) Flinchum, Kelby Aline, FNP (Family Medicine) [name], RN as Oncology Nurse Navigator   Name of the patient: Cassandra [name]  [id number]  [birth year]   Date of visit: 03/19/20 REASON FOR COSULTATION:  Bacteremia, pancreatic cancer on chemotherapy History of presenting illness-  64 y.o. female with PMH listed at below including stage IV pancreatic adenocarcinoma, on chemotherapy, presents to emergency room for evaluation of bacteremia from my recommendation. Patient was seen by me yesterday for acute visit.  She reports feeling abdominal discomfort and bloating.  She did not have a fever at home but did have a fever of 101 in the clinic.  She denies any sore throat, respiratory symptoms, issues with her port, diarrhea, dysuria or increased frequency.  UA was negative for nitrate, positive for small amount of leukocytes.  Urine culture showed multiple species.  Blood culture was obtained Patient received 1 L of IV fluid.  Because she was feeling well without any symptoms, I did not start her on antibiotics and asked her to monitor her temperature..  Patient also reports feeling better after IV fluid and no fever at home.  This morning, blood culture came back both aerobic and anaerobic bottles grew gram-positive cocci, patient was advised to come to emergency room for evaluation and start on IV antibiotics. Oncology was consulted.  I saw patient emergency room.  She is on the way to get chest x-ray.  Denies any fever last night.  Denies any pain currently.  Feels very tired   Review of Systems  Constitutional: Positive for appetite change, fatigue and unexpected weight change. Negative for chills and fever.  HENT:   Negative for hearing loss and voice change.   Eyes: Negative for eye problems.    Respiratory: Negative for chest tightness and cough.   Cardiovascular: Negative for chest pain.  Gastrointestinal: Negative for abdominal distention, abdominal pain and blood in stool.  Endocrine: Negative for hot flashes.  Genitourinary: Negative for difficulty urinating and frequency.   Musculoskeletal: Negative for arthralgias.  Skin: Negative for itching and rash.  Neurological: Negative for extremity weakness.  Hematological: Negative for adenopathy.  Psychiatric/Behavioral: Negative for confusion.    Allergies  Allergen Reactions  . Penicillin G Hives and Swelling    Rxn as a child    Patient Active Problem List   Diagnosis Date Noted  . Sepsis ([place]) 03/19/2020  . Bacteremia due to Streptococcus pneumoniae 03/19/2020  . HLD (hyperlipidemia) 03/19/2020  . Hyponatremia 03/19/2020  . UTI (urinary tract infection) 03/19/2020  . Encounter for antineoplastic chemotherapy 03/11/2020  . TIA (transient ischemic attack) 03/11/2020  . Primary pancreatic cancer  ([place]) 02/29/2020  . Family history of cancer 02/29/2020  . Goals of care, counseling/discussion 02/29/2020  . Obstructive jaundice 02/29/2020  . Plantar fasciitis, right 09/19/2019  . Varicose veins of both lower extremities with inflammation 09/19/2019  . Pure hypercholesterolemia 09/17/2018  . Osteopenia after menopause 09/11/2017  . Postmenopausal 05/12/2017     Past Medical History:  Diagnosis Date  . Cancer [facility])    pancreatic cancer  . Colon polyps   . Osteopenia after menopause 05/2017   femoral neck T score -2.0     Past Surgical History:  Procedure Laterality Date  . COLONOSCOPY  07/2015   WNL  . COLONOSCOPY  2008/2011  . OVARIAN CYST REMOVAL  1992   dermoid-Dr Lancaster  .  PORTA CATH INSERTION N/A 03/07/2020   Procedure: PORTA CATH INSERTION;  Surgeon: Algernon Huxley, MD;  Location: [facility];  Service: Cardiovascular;  Laterality: N/A;  . TUBAL LIGATION  1993    Social History    Socioeconomic History  . Marital status: Married    Spouse name: Not on file  . Number of children: 2  . Years of education: Not on file  . Highest education level: Not on file  Occupational History  . Occupation: Pharmacist, hospital    Comment: retired  . Occupation: Farmer  Tobacco Use  . Smoking status: Former Smoker    Packs/day: 0.50    Years: 10.00    Pack years: 5.00    Types: Cigarettes    Quit date: 1987    Years since quitting: 34.7  . Smokeless tobacco: Never Used  . Tobacco comment: Quit smoking 1987  Vaping Use  . Vaping Use: Never used  Substance and Sexual Activity  . Alcohol use: Not Currently    Comment: 0-2 mixed drinks a day  . Drug use: No  . Sexual activity: Not Currently    Birth control/protection: Post-menopausal  Other Topics Concern  . Not on file  Social History Narrative  . Not on file   Social Determinants of Health   Financial Resource Strain:   . Difficulty of Paying Living Expenses: Not on file  Food Insecurity:   . Worried About Charity fundraiser in the Last Year: Not on file  . Ran Out of Food in the Last Year: Not on file  Transportation Needs:   . Lack of Transportation (Medical): Not on file  . Lack of Transportation (Non-Medical): Not on file  Physical Activity:   . Days of Exercise per Week: Not on file  . Minutes of Exercise per Session: Not on file  Stress:   . Feeling of Stress : Not on file  Social Connections:   . Frequency of Communication with Friends and Family: Not on file  . Frequency of Social Gatherings with Friends and Family: Not on file  . Attends Religious Services: Not on file  . Active Member of Clubs or Organizations: Not on file  . Attends Archivist Meetings: Not on file  . Marital Status: Not on file  Intimate Partner Violence:   . Fear of Current or Ex-Partner: Not on file  . Emotionally Abused: Not on file  . Physically Abused: Not on file  . Sexually Abused: Not on file     Family History   Problem Relation Age of Onset  . Breast cancer Paternal Aunt 62  . Diabetes Mother   . Osteoporosis Mother   . Hyperlipidemia Father   . Rheumatic fever Father   . Valvular heart disease Father   . Colon cancer Paternal Grandmother   . Breast cancer Sister 38     Current Facility-Administered Medications:  .  0.9 %  sodium chloride infusion, , Intravenous, Continuous, Ivor Costa, MD .  acetaminophen (TYLENOL) tablet 650 mg, 650 mg, Oral, Q6H PRN, Ivor Costa, MD .  cefTRIAXone (ROCEPHIN) 1 g in sodium chloride 0.9 % 100 mL IVPB, 1 g, Intravenous, Once, Ivor Costa, MD .  Derrill Memo ON 03/20/2020] cefTRIAXone (ROCEPHIN) 2 g in sodium chloride 0.9 % 100 mL IVPB, 2 g, Intravenous, Q24H, Niu, Xilin, MD .  enoxaparin (LOVENOX) injection 40 mg, 40 mg, Subcutaneous, Q24H, Niu, Xilin, MD .  ondansetron (ZOFRAN) injection 4 mg, 4 mg, Intravenous, Q8H PRN, Ivor Costa,  MD  Current Outpatient Medications:  .  aspirin EC 81 MG tablet, Take 1 tablet (81 mg total) by mouth daily. Swallow whole. (Patient not taking: Reported on 03/14/2020), Disp: 150 tablet, Rfl: 2 .  atorvastatin (LIPITOR) 40 MG tablet, Take 1 tablet (40 mg total) by mouth daily. (Patient not taking: Reported on 03/14/2020), Disp: 30 tablet, Rfl: 0 .  calcium-vitamin D (OSCAL WITH D) 500-200 MG-UNIT TABS tablet, Take 1 tablet by mouth daily.  (Patient not taking: Reported on 03/18/2020), Disp: , Rfl:  .  Cholecalciferol 25 MCG (1000 UT) tablet, Take 2,000 Units by mouth daily., Disp: , Rfl:  .  cholestyramine (QUESTRAN) 4 GM/DOSE powder, Take by mouth. PRN, Disp: , Rfl:  .  lidocaine-prilocaine (EMLA) cream, Apply to affected area once, Disp: 30 g, Rfl: 3 .  loperamide (IMODIUM A-D) 2 MG tablet, Take 2 at onset of diarrhea, then 1 every 2hrs until 12hr without a BM. May take 2 tab every 4hrs at bedtime. If diarrhea recurs repeat. (Patient not taking: Reported on 03/18/2020), Disp: 100 tablet, Rfl: 1 .  Multiple Vitamins-Minerals (MULTIVITAMIN  ADULT PO), Take by mouth., Disp: , Rfl:  .  ondansetron (ZOFRAN) 8 MG tablet, Take 1 tablet (8 mg total) by mouth 2 (two) times daily as needed. Start on day 3 after chemotherapy., Disp: 30 tablet, Rfl: 1 .  prochlorperazine (COMPAZINE) 10 MG tablet, Take 1 tablet (10 mg total) by mouth every 6 (six) hours as needed (Nausea or vomiting)., Disp: 30 tablet, Rfl: 1 .  simethicone (GAS-X) 80 MG chewable tablet, Chew 1 tablet (80 mg total) by mouth every 8 (eight) hours as needed for flatulence., Disp: 60 tablet, Rfl: 0   Physical exam:  Vitals:   03/19/20 0918 03/19/20 0922 03/19/20 1100 03/19/20 1200  BP: 120/84  111/80 130/80  Pulse: 95  81 83  Resp:  18    Temp: 98.3 F (36.8 C)   99.4 F (37.4 C)  TempSrc: Oral   Oral  SpO2: 98%  100% 100%  Weight: 138 lb (62.6 kg)     Height: 5\' 7"  (1.702 m)      Physical Exam Constitutional:      General: She is not in acute distress.    Appearance: She is not diaphoretic.  HENT:     Head: Normocephalic and atraumatic.     Nose: Nose normal.     Mouth/Throat:     Pharynx: No oropharyngeal exudate.  Eyes:     General: No scleral icterus.    Pupils: Pupils are equal, round, and reactive to light.  Cardiovascular:     Rate and Rhythm: Normal rate and regular rhythm.     Heart sounds: No murmur heard.   Pulmonary:     Effort: Pulmonary effort is normal. No respiratory distress.     Breath sounds: No rales.  Chest:     Chest wall: No tenderness.  Abdominal:     General: There is no distension.     Palpations: Abdomen is soft.     Tenderness: There is no abdominal tenderness.  Musculoskeletal:        General: Normal range of motion.     Cervical back: Normal range of motion and neck supple.  Skin:    General: Skin is warm and dry.     Findings: No erythema.  Neurological:     Mental Status: She is alert and oriented to person, place, and time.     Cranial Nerves: No cranial nerve deficit.  Motor: No abnormal muscle tone.      Coordination: Coordination normal.  Psychiatric:        Mood and Affect: Affect normal.         CMP Latest Ref Rng & Units 03/19/2020  Glucose 70 - 99 mg/dL 145(H)  BUN 8 - 23 mg/dL 11  Creatinine 0.44 - 1.00 mg/dL 0.85  Sodium 135 - 145 mmol/L 129(L)  Potassium 3.5 - 5.1 mmol/L 3.8  Chloride 98 - 111 mmol/L 95(L)  CO2 22 - 32 mmol/L 25  Calcium 8.9 - 10.3 mg/dL 8.9  Total Protein 6.5 - 8.1 g/dL 7.1  Total Bilirubin 0.3 - 1.2 mg/dL 1.5(H)  Alkaline Phos 38 - 126 U/L 125  AST 15 - 41 U/L 30  ALT 0 - 44 U/L 43   CBC Latest Ref Rng & Units 03/19/2020  WBC 4.0 - 10.5 K/uL 8.2  Hemoglobin 12.0 - 15.0 g/dL 11.4(L)  Hematocrit 36 - 46 % 30.8(L)  Platelets 150 - 400 K/uL 191    RADIOGRAPHIC STUDIES: I have personally reviewed the radiological images as listed and agreed with the findings in the report. CT ANGIO HEAD W OR WO CONTRAST  Result Date: 03/12/2020 CLINICAL DATA:  Slurred speech.  Pancreatic cancer patient. EXAM: CT ANGIOGRAPHY HEAD AND NECK TECHNIQUE: Multidetector CT imaging of the head and neck was performed using the standard protocol during bolus administration of intravenous contrast. Multiplanar CT image reconstructions and MIPs were obtained to evaluate the vascular anatomy. Carotid stenosis measurements (when applicable) are obtained utilizing NASCET criteria, using the distal internal carotid diameter as the denominator. CONTRAST:  52mL OMNIPAQUE IOHEXOL 350 MG/ML SOLN COMPARISON:  Head CT yesterday. FINDINGS: CT HEAD FINDINGS Brain: No evidence of acute infarction. No accelerated brain atrophy. No mass, hemorrhage, hydrocephalus or extra-axial collection. Vascular: There is atherosclerotic calcification of the major vessels at the base of the brain. Skull: Negative Sinuses: Clear Orbits: Normal Review of the MIP images confirms the above findings CTA NECK FINDINGS Aortic arch: Aortic atherosclerosis. Branching pattern is normal without origin stenosis. Right carotid  system: Common carotid artery widely patent to the bifurcation. Calcified plaque at the carotid bifurcation but no stenosis. ICA bulb widely patent. Cervical ICA is normal. Left carotid system: Common carotid artery widely patent to the bifurcation. Carotid bifurcation is normal without plaque or stenosis. Cervical ICA is normal. Vertebral arteries: Both vertebral arteries widely patent at their origins and through the cervical region to the foramen magnum. Skeleton: Ordinary cervical spondylosis. Other neck: No mass or lymphadenopathy. Upper chest: Normal Review of the MIP images confirms the above findings CTA HEAD FINDINGS Anterior circulation: Both internal carotid arteries widely patent through the skull base and siphon regions. Ordinary siphon atherosclerotic calcification but without stenosis greater than 30%. The anterior and middle cerebral vessels are normal without proximal stenosis, aneurysm or vascular malformation. No large or medium vessel occlusion. Posterior circulation: Both vertebral arteries widely patent to the basilar. No basilar stenosis. Posterior circulation branch vessels are normal. Venous sinuses: Patent and normal. Anatomic variants: None significant. Review of the MIP images confirms the above findings IMPRESSION: 1. Mild atherosclerotic disease at the right carotid bifurcation but without stenosis. Left carotid bifurcation normal. 2. No intracranial large or medium vessel occlusion or correctable proximal stenosis. 3. Aortic atherosclerosis. Aortic Atherosclerosis (ICD10-I70.0). Electronically Signed   By: Nelson Chimes M.D.   On: 03/12/2020 10:46   DG Chest 2 View  Result Date: 03/19/2020 CLINICAL DATA:  Bacteremia, evaluate for pneumonia. EXAM: CHEST -  2 VIEW COMPARISON:  Prior chest CT 03/13/2020. FINDINGS: A right chest infusion port catheter is present with tip terminating in the region of the superior cavoatrial junction. Heart size within normal limits. Aortic  atherosclerosis. No appreciable airspace consolidation. No evidence of pleural effusion or pneumothorax. No acute bony abnormality identified. IMPRESSION: No evidence of active cardiopulmonary disease. Aortic Atherosclerosis (ICD10-I70.0). Electronically Signed   By: Kellie Simmering DO   On: 03/19/2020 12:19   CT HEAD WO CONTRAST  Result Date: 03/11/2020 CLINICAL DATA:  Slurred speech EXAM: CT HEAD WITHOUT CONTRAST TECHNIQUE: Contiguous axial images were obtained from the base of the skull through the vertex without intravenous contrast. COMPARISON:  None. FINDINGS: Brain: No evidence of acute territorial infarction, hemorrhage, hydrocephalus,extra-axial collection or mass lesion/mass effect. Normal gray-white differentiation. Ventricles are normal in size and contour. Vascular: No hyperdense vessel or unexpected calcification. Skull: The skull is intact. No fracture or focal lesion identified. Sinuses/Orbits: The visualized paranasal sinuses and mastoid air cells are clear. The orbits and globes intact. Other: None IMPRESSION: No acute intracranial abnormality. Electronically Signed   By: Prudencio Pair M.D.   On: 03/11/2020 16:48   CT ANGIO NECK W OR WO CONTRAST  Result Date: 03/12/2020 CLINICAL DATA:  Slurred speech.  Pancreatic cancer patient. EXAM: CT ANGIOGRAPHY HEAD AND NECK TECHNIQUE: Multidetector CT imaging of the head and neck was performed using the standard protocol during bolus administration of intravenous contrast. Multiplanar CT image reconstructions and MIPs were obtained to evaluate the vascular anatomy. Carotid stenosis measurements (when applicable) are obtained utilizing NASCET criteria, using the distal internal carotid diameter as the denominator. CONTRAST:  39mL OMNIPAQUE IOHEXOL 350 MG/ML SOLN COMPARISON:  Head CT yesterday. FINDINGS: CT HEAD FINDINGS Brain: No evidence of acute infarction. No accelerated brain atrophy. No mass, hemorrhage, hydrocephalus or extra-axial collection.  Vascular: There is atherosclerotic calcification of the major vessels at the base of the brain. Skull: Negative Sinuses: Clear Orbits: Normal Review of the MIP images confirms the above findings CTA NECK FINDINGS Aortic arch: Aortic atherosclerosis. Branching pattern is normal without origin stenosis. Right carotid system: Common carotid artery widely patent to the bifurcation. Calcified plaque at the carotid bifurcation but no stenosis. ICA bulb widely patent. Cervical ICA is normal. Left carotid system: Common carotid artery widely patent to the bifurcation. Carotid bifurcation is normal without plaque or stenosis. Cervical ICA is normal. Vertebral arteries: Both vertebral arteries widely patent at their origins and through the cervical region to the foramen magnum. Skeleton: Ordinary cervical spondylosis. Other neck: No mass or lymphadenopathy. Upper chest: Normal Review of the MIP images confirms the above findings CTA HEAD FINDINGS Anterior circulation: Both internal carotid arteries widely patent through the skull base and siphon regions. Ordinary siphon atherosclerotic calcification but without stenosis greater than 30%. The anterior and middle cerebral vessels are normal without proximal stenosis, aneurysm or vascular malformation. No large or medium vessel occlusion. Posterior circulation: Both vertebral arteries widely patent to the basilar. No basilar stenosis. Posterior circulation branch vessels are normal. Venous sinuses: Patent and normal. Anatomic variants: None significant. Review of the MIP images confirms the above findings IMPRESSION: 1. Mild atherosclerotic disease at the right carotid bifurcation but without stenosis. Left carotid bifurcation normal. 2. No intracranial large or medium vessel occlusion or correctable proximal stenosis. 3. Aortic atherosclerosis. Aortic Atherosclerosis (ICD10-I70.0). Electronically Signed   By: Nelson Chimes M.D.   On: 03/12/2020 10:46   CT CHEST W  CONTRAST  Result Date: 03/13/2020 CLINICAL DATA:  65 year old female  with history of pancreatic cancer. EXAM: CT CHEST WITH CONTRAST TECHNIQUE: Multidetector CT imaging of the chest was performed during intravenous contrast administration. CONTRAST:  66mL OMNIPAQUE IOHEXOL 300 MG/ML  SOLN COMPARISON:  No priors. FINDINGS: Cardiovascular: Heart size is normal. There is no significant pericardial fluid, thickening or pericardial calcification. 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 No definite coronary artery calcifications. Right internal jugular single-lumen porta cath with tip terminating in the superior aspect of the right atrium. Mediastinum/Nodes: No pathologically enlarged mediastinal or hilar lymph nodes. Esophagus is unremarkable in appearance. No axillary lymphadenopathy. Lungs/Pleura: No suspicious appearing pulmonary nodules or masses are noted. No acute consolidative airspace disease. No pleural effusions. Upper Abdomen: Extensive pneumobilia. Musculoskeletal: There are no aggressive appearing lytic or blastic lesions noted in the visualized portions of the skeleton. IMPRESSION: 1. No findings to suggest metastatic disease to the thorax. 2. 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 Recommend annual imaging followup by CTA or MRA. This recommendation follows 2010 ACCF/AHA/AATS/ACR/ASA/SCA/SCAI/SIR/STS/SVM Guidelines for the Diagnosis and Management of Patients with Thoracic Aortic Disease. Circulation. 2010; 121: J941-D408. Aortic aneurysm NOS (ICD10-I71.9). Aortic Atherosclerosis (ICD10-I70.0). Electronically Signed   By: Vinnie Langton M.D.   On: 03/13/2020 11:59   MR BRAIN WO CONTRAST  Result Date: 03/12/2020 CLINICAL DATA:  Transit ischemic attack.  Slurred speech. EXAM: MRI HEAD WITHOUT CONTRAST TECHNIQUE: Multiplanar, multiecho pulse sequences of the brain and surrounding structures were  obtained without intravenous contrast. COMPARISON:  03/11/2020 head CT.  03/12/2020 CTA head and neck. FINDINGS: Brain: No acute infarct or intracranial hemorrhage. Cerebral volume is within normal limits. Minimal chronic microvascular ischemic changes. No midline shift, ventriculomegaly or extra-axial fluid collection. No mass lesion. Vascular: Please see same day CTA. Skull and upper cervical spine: Normal marrow signal. Sinuses/Orbits: Normal orbits. Clear paranasal sinuses. No mastoid effusion. Other: None. IMPRESSION: No acute intracranial process. Minimal chronic microvascular ischemic changes. Electronically Signed   By: Primitivo Gauze M.D.   On: 03/12/2020 11:49   US Carotid Bilateral (at [facility] and [facility] only)  Result Date: 03/12/2020 CLINICAL DATA:  64 year old female with a history of TIA EXAM: BILATERAL CAROTID DUPLEX ULTRASOUND TECHNIQUE: Pearline Cables scale imaging, color Doppler and duplex ultrasound were performed of bilateral carotid and vertebral arteries in the neck. COMPARISON:  None. FINDINGS: Criteria: Quantification of carotid stenosis is based on velocity parameters that correlate the residual internal carotid diameter with NASCET-based stenosis levels, using the diameter of the distal internal carotid lumen as the denominator for stenosis measurement. The following velocity measurements were obtained: RIGHT ICA:  Systolic 144 cm/sec, Diastolic 52 cm/sec CCA:  98 cm/sec SYSTOLIC ICA/CCA RATIO:  1.6 ECA:  170 cm/sec LEFT ICA:  Systolic 89 cm/sec, Diastolic 14 cm/sec CCA:  95 cm/sec SYSTOLIC ICA/CCA RATIO:  0.9 ECA:  119 cm/sec Right Brachial SBP: Not acquired Left Brachial SBP: Not acquired RIGHT CAROTID ARTERY: No significant calcifications of the right common carotid artery. Intermediate waveform maintained. Heterogeneous and partially calcified plaque at the right carotid bifurcation. No significant lumen shadowing. Low resistance waveform of the right ICA. No significant tortuosity. RIGHT  VERTEBRAL ARTERY: Antegrade flow with low resistance waveform. LEFT CAROTID ARTERY: No significant calcifications of the left common carotid artery. Intermediate waveform maintained. Heterogeneous and partially calcified plaque at the left carotid bifurcation without significant lumen shadowing. Low resistance waveform of the left ICA. No significant tortuosity. LEFT VERTEBRAL ARTERY:  Antegrade flow with low resistance waveform. IMPRESSION: Right: Heterogeneous  and partially calcified plaque at the right carotid bifurcation, with discordant results regarding degree of stenosis by established duplex criteria. Peak velocity suggests 50%-69% stenosis, with the ICA/ CCA ratio suggesting a lesser degree of stenosis. If establishing a more accurate degree of stenosis is required, cerebral angiogram should be considered, or as a second best test, CTA. Left: Color duplex indicates minimal heterogeneous and calcified plaque, with no hemodynamically significant stenosis by duplex criteria in the extracranial cerebrovascular circulation. Signed, Dulcy Fanny. Dellia Nims, RPVI [facility] Electronically Signed   By: Corrie Mckusick D.O.   On: 03/12/2020 14:02   PERIPHERAL VASCULAR CATHETERIZATION  Result Date: 03/07/2020 See op note  ECHOCARDIOGRAM COMPLETE  Result Date: 03/12/2020    ECHOCARDIOGRAM REPORT   Patient Name:   MAGEN SURIANO Date of Exam: 03/12/2020 Medical Rec #:  [id number]   Height:       67.0 in Accession #:    [id number]  Weight:       145.3 lb Date of Birth:  1956-06-26   BSA:          1.765 m Patient Age:    70 years    BP:           125/74 mmHg Patient Gender: F           HR:           63 bpm. Exam Location:  [facility] Procedure: 2D Echo, Cardiac Doppler and Color Doppler Indications:     TIA 435.9  History:         Patient has no prior history of Echocardiogram examinations. No                  cardiac hsitory listed.  Sonographer:     Sherrie Sport RDCS  (AE) Referring Phys:  [id number] Sidney Ace Diagnosing Phys: Kate Sable MD IMPRESSIONS  1. Left ventricular ejection fraction, by estimation, is 65 to 70%. The left ventricle has normal function. The left ventricle has no regional wall motion abnormalities. Left ventricular diastolic parameters were normal.  2. Right ventricular systolic function is normal. The right ventricular size is normal. There is normal pulmonary artery systolic pressure.  3. The mitral valve is normal in structure. No evidence of mitral valve regurgitation. No evidence of mitral stenosis.  4. The aortic valve is normal in structure. Aortic valve regurgitation is not visualized. No aortic stenosis is present.  5. The inferior vena cava is normal in size with greater than 50% respiratory variability, suggesting right atrial pressure of 3 mmHg. FINDINGS  Left Ventricle: Left ventricular ejection fraction, by estimation, is 65 to 70%. The left ventricle has normal function. The left ventricle has no regional wall motion abnormalities. The left ventricular internal cavity size was normal in size. There is  no left ventricular hypertrophy. Left ventricular diastolic parameters were normal. Right Ventricle: The right ventricular size is normal. No increase in right ventricular wall thickness. Right ventricular systolic function is normal. There is normal pulmonary artery systolic pressure. The tricuspid regurgitant velocity is 2.57 m/s, and  with an assumed right atrial pressure of 3 mmHg, the estimated right ventricular systolic pressure is 54.6 mmHg. Left Atrium: Left atrial size was normal in size. Right Atrium: Right atrial size was normal in size. Pericardium: There is no evidence of pericardial effusion. Mitral Valve: The mitral valve is normal in structure. No evidence of mitral valve regurgitation. No evidence of mitral valve stenosis. Tricuspid Valve: The  tricuspid valve is normal in structure. Tricuspid valve regurgitation is  not demonstrated. No evidence of tricuspid stenosis. Aortic Valve: The aortic valve is normal in structure. Aortic valve regurgitation is not visualized. No aortic stenosis is present. Aortic valve mean gradient measures 4.5 mmHg. Aortic valve peak gradient measures 7.3 mmHg. Aortic valve area, by VTI measures 3.23 cm. Pulmonic Valve: The pulmonic valve was normal in structure. Pulmonic valve regurgitation is not visualized. No evidence of pulmonic stenosis. Aorta: The aortic root is normal in size and structure. Venous: The inferior vena cava is normal in size with greater than 50% respiratory variability, suggesting right atrial pressure of 3 mmHg. IAS/Shunts: No atrial level shunt detected by color flow Doppler.  LEFT VENTRICLE PLAX 2D LVIDd:         4.00 cm  Diastology LVIDs:         2.28 cm  LV e' medial:    7.72 cm/s LV PW:         1.50 cm  LV E/e' medial:  10.8 LV IVS:        0.90 cm  LV e' lateral:   11.50 cm/s LVOT diam:     2.00 cm  LV E/e' lateral: 7.3 LV SV:         96 LV SV Index:   54 LVOT Area:     3.14 cm  RIGHT VENTRICLE RV Basal diam:  3.77 cm RV S prime:     19.00 cm/s TAPSE (M-mode): 3.7 cm LEFT ATRIUM             Index       RIGHT ATRIUM           Index LA diam:        2.30 cm 1.30 cm/m  RA Area:     16.70 cm LA Vol (A2C):   45.2 ml 25.60 ml/m RA Volume:   45.40 ml  25.72 ml/m LA Vol (A4C):   35.1 ml 19.88 ml/m LA Biplane Vol: 39.1 ml 22.15 ml/m  AORTIC VALVE                   PULMONIC VALVE AV Area (Vmax):    3.22 cm    PV Vmax:        0.79 m/s AV Area (Vmean):   3.07 cm    PV Peak grad:   2.5 mmHg AV Area (VTI):     3.23 cm    RVOT Peak grad: 4 mmHg AV Vmax:           135.50 cm/s AV Vmean:          99.250 cm/s AV VTI:            0.297 m AV Peak Grad:      7.3 mmHg AV Mean Grad:      4.5 mmHg LVOT Vmax:         139.00 cm/s LVOT Vmean:        97.100 cm/s LVOT VTI:          0.305 m LVOT/AV VTI ratio: 1.03  AORTA Ao Root diam: 3.50 cm MITRAL VALVE               TRICUSPID VALVE MV Area  (PHT): 2.39 cm    TR Peak grad:   26.4 mmHg MV Decel Time: 317 msec    TR Vmax:        257.00 cm/s MV E velocity: 83.40 cm/s MV A velocity: 72.30 cm/s  SHUNTS  MV E/A ratio:  1.15        Systemic VTI:  0.30 m                            Systemic Diam: 2.00 cm Kate Sable MD Electronically signed by Kate Sable MD Signature Date/Time: 03/12/2020/4:01:51 PM    Final     Assessment and plan- Patient is a 64 y.o. 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  Patient is on chemotherapy and is immunocompromise.  Not neutropenic. Source of infection, biliary stent vs Mediport.  stent/Mediport need to be replaced/discontinued? Appreciate ID input.  Continue IV ceftriaxone.  Continue IV fluid hydration.  #Stage IV pancreatic cancer status post first cycle of FOLFIRINOX.  5-FU pump was discontinued on 03/17/2020. Counts are stable.  Hold off chemotherapy due to acute issue.  Follow-up outpatient. Goals of treatment is with palliative intent.  #Jaundice, status post biliary stent placement. Stent appears to be functioning with increasing hyperbilirubinemia.  Bilirubin level 1.5 today.  #Hyponatremia likely secondary to decreased oral intake. SIADH?  Urine sodium, osmolarity, and serum osmolarity  Continue IV hydration.   Thank you for allowing me to participate in the care of this patient.  [name], MD, PhD Hematology Oncology [facility] Pager- [phone] 03/19/2020

## 2020-03-19 NOTE — Consult Note (Signed)
NAME: [name]  DOB: 1956/06/03  MRN: [id number]  Date/Time: 03/19/2020 2:34 PM  REQUESTING PROVIDER:Niu Subjective:  REASON FOR CONSULT: bacteremia ? Cassandra [name] is a 64 y.o. female with a history of pancreatic cancer on chemotherapy is admitted for strep pneumo bacteremia. Patient is followed by oncologist Dr. Tasia Catchings. As per her note patient had initially presented with jaundice transaminitis and bilirubin of 9.9 and an MRI done on 02/08/2020 at [facility] showed pancreatic head mass measuring up to 3 cm with marked associated narrowing of the portal vein confluence.  There was marked intrahepatic and extrahepatic biliary duct dilatation as well as dilatation of the main pancreatic duct.  There are multiple hepatic masses were seen.  She underwent EUS on 02/19/2020.  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 A biliary stricture was found at the mid to lower third of the medial bile duct and it was treated with placement of the metal stent.  Patient also had biopsy of the mass during that procedure. She was referred to [facility] to undergo chemotherapy with FOLFIRINOX.  On day 1 of treatment after 50% of the irinotecan was given she developed slurred speech and was given Benadryl and Solu-Medrol.  And then she was in the hospital for 24 hours with extensive work-up for stroke including CT head, CT a head CTA neck MRI brain carotid Doppler X echocardiogram and all were within normal limits.  The working diagnosis was TIA with slurred speech.  She was discharged on aspirin and Lipitor. She saw Dr. Tasia Catchings as outpatient on 03/18/2020.  She was complaining of abdominal discomfort.  She had a temperature of 101.  Blood culture was sent and  came back positive for strep pneumo patient is being called back to the hospital.  In the ED today the temperature is 99, BP of 119/75, heart rate of 83, pulse ox of 100%. Labs revealed a WBC of 8.2, hemoglobin of 11.4, PLT of 191, NA of 129, CR of 0.85, total bili of 1.5,  AST 30, ALT 43, lactate 1.22 and pro-Cal of 0.55.  Pt denies any cough, sob, chest pain, pain at pot site. She had some abdominal bloating which got better after 2 loose bowl movts.  She denies any sick contacts, trauma, bites. She is triple vaccinated for covid Past Medical History:  Diagnosis Date  . Cancer [facility])    pancreatic cancer  . Colon polyps   . Osteopenia after menopause 05/2017   femoral neck T score -2.0    Past Surgical History:  Procedure Laterality Date  . COLONOSCOPY  07/2015   WNL  . COLONOSCOPY  2008/2011  . OVARIAN CYST REMOVAL  1992   dermoid-Dr [place]  . PORTA CATH INSERTION N/A 03/07/2020   Procedure: PORTA CATH INSERTION;  Surgeon: Algernon Huxley, MD;  Location: [facility];  Service: Cardiovascular;  Laterality: N/A;  . TUBAL LIGATION  1993    Social History   Socioeconomic History  . Marital status: Married    Spouse name: Not on file  . Number of children: 2  . Years of education: Not on file  . Highest education level: Not on file  Occupational History  . Occupation: Pharmacist, hospital    Comment: retired  . Occupation: Farmer  Tobacco Use  . Smoking status: Former Smoker    Packs/day: 0.50    Years: 10.00    Pack years: 5.00    Types: Cigarettes    Quit date: 1987    Years since  quitting: 34.7  . Smokeless tobacco: Never Used  . Tobacco comment: Quit smoking 1987  Vaping Use  . Vaping Use: Never used  Substance and Sexual Activity  . Alcohol use: Not Currently    Comment: 0-2 mixed drinks a day  . Drug use: No  . Sexual activity: Not Currently    Birth control/protection: Post-menopausal  Other Topics Concern  . Not on file  Social History Narrative  . Not on file   Social Determinants of Health   Financial Resource Strain:   . Difficulty of Paying Living Expenses: Not on file  Food Insecurity:   . Worried About Charity fundraiser in the Last Year: Not on file  . Ran Out of Food in the Last Year: Not on file  Transportation Needs:    . Lack of Transportation (Medical): Not on file  . Lack of Transportation (Non-Medical): Not on file  Physical Activity:   . Days of Exercise per Week: Not on file  . Minutes of Exercise per Session: Not on file  Stress:   . Feeling of Stress : Not on file  Social Connections:   . Frequency of Communication with Friends and Family: Not on file  . Frequency of Social Gatherings with Friends and Family: Not on file  . Attends Religious Services: Not on file  . Active Member of Clubs or Organizations: Not on file  . Attends Archivist Meetings: Not on file  . Marital Status: Not on file  Intimate Partner Violence:   . Fear of Current or Ex-Partner: Not on file  . Emotionally Abused: Not on file  . Physically Abused: Not on file  . Sexually Abused: Not on file    Family History  Problem Relation Age of Onset  . Breast cancer Paternal Aunt 88  . Diabetes Mother   . Osteoporosis Mother   . Hyperlipidemia Father   . Rheumatic fever Father   . Valvular heart disease Father   . Colon cancer Paternal Grandmother   . Breast cancer Sister 65   Allergies  Allergen Reactions  . Penicillin G Hives and Swelling    Rxn as a child    ? Current Facility-Administered Medications  Medication Dose Route Frequency Provider Last Rate Last Admin  . 0.9 %  sodium chloride infusion   Intravenous Continuous Ivor Costa, MD 75 mL/hr at 03/19/20 1316 New Bag at 03/19/20 1316  . acetaminophen (TYLENOL) tablet 650 mg  650 mg Oral Q6H PRN Ivor Costa, MD      . Derrill Memo ON 03/20/2020] cefTRIAXone (ROCEPHIN) 2 g in sodium chloride 0.9 % 100 mL IVPB  2 g Intravenous Q24H Ivor Costa, MD      . enoxaparin (LOVENOX) injection 40 mg  40 mg Subcutaneous Q24H Ivor Costa, MD   40 mg at 03/19/20 1318  . ondansetron (ZOFRAN) injection 4 mg  4 mg Intravenous Q8H PRN Ivor Costa, MD       Current Outpatient Medications  Medication Sig Dispense Refill  . Cholecalciferol 25 MCG (1000 UT) tablet Take 2,000  Units by mouth daily.    . cholestyramine (QUESTRAN) 4 GM/DOSE powder Take 4 g by mouth in the morning and at bedtime.     . lidocaine-prilocaine (EMLA) cream Apply to affected area once (Patient taking differently: Apply 1 application topically as directed. ) 30 g 3  . loperamide (IMODIUM A-D) 2 MG tablet Take 2 at onset of diarrhea, then 1 every 2hrs until 12hr without a BM. May  take 2 tab every 4hrs at bedtime. If diarrhea recurs repeat. 100 tablet 1  . Multiple Vitamins-Minerals (MULTIVITAMIN ADULT PO) Take 1 tablet by mouth daily.     . ondansetron (ZOFRAN) 8 MG tablet Take 1 tablet (8 mg total) by mouth 2 (two) times daily as needed. Start on day 3 after chemotherapy. 30 tablet 1  . prochlorperazine (COMPAZINE) 10 MG tablet Take 1 tablet (10 mg total) by mouth every 6 (six) hours as needed (Nausea or vomiting). 30 tablet 1  . simethicone (GAS-X) 80 MG chewable tablet Chew 1 tablet (80 mg total) by mouth every 8 (eight) hours as needed for flatulence. 60 tablet 0  . aspirin EC 81 MG tablet Take 1 tablet (81 mg total) by mouth daily. Swallow whole. 150 tablet 2  . atorvastatin (LIPITOR) 40 MG tablet Take 1 tablet (40 mg total) by mouth daily. 30 tablet 0     Abtx:  Anti-infectives (From admission, onward)   Start     Dose/Rate Route Frequency Ordered Stop   03/20/20 1000  cefTRIAXone (ROCEPHIN) 1 g in sodium chloride 0.9 % 100 mL IVPB  Status:  Discontinued        1 g 200 mL/hr over 30 Minutes Intravenous Every 24 hours 03/19/20 1149 03/19/20 1155   03/20/20 1000  cefTRIAXone (ROCEPHIN) 2 g in sodium chloride 0.9 % 100 mL IVPB        2 g 200 mL/hr over 30 Minutes Intravenous Every 24 hours 03/19/20 1157     03/19/20 1200  cefTRIAXone (ROCEPHIN) 1 g in sodium chloride 0.9 % 100 mL IVPB        1 g 200 mL/hr over 30 Minutes Intravenous  Once 03/19/20 1157 03/19/20 1427   03/19/20 1030  cefTRIAXone (ROCEPHIN) 1 g in sodium chloride 0.9 % 100 mL IVPB        1 g 200 mL/hr over 30 Minutes  Intravenous  Once 03/19/20 1018 03/19/20 1226      REVIEW OF SYSTEMS:  Const: negative fever, had  chills,  weight loss Eyes: negative diplopia or visual changes, negative eye pain ENT: negative coryza, negative sore throat Resp: negative cough, hemoptysis, dyspnea Cards: negative for chest pain, palpitations, lower extremity edema GU: negative for frequency, dysuria and hematuria GI: as above Skin: negative for rash and pruritus Heme: negative for easy bruising and gum/nose bleeding MS: negative for myalgias, arthralgias, back pain and muscle weakness Neurolo:negative for headaches, dizziness, vertigo, memory problems  Psych: negative for feelings of anxiety, depression  Endocrine: negative for thyroid, diabetes Allergy/Immunology- PCN as a child Objective:  VITALS:  BP 124/75   Pulse 83   Temp 99.4 F (37.4 C) (Oral)   Resp 19   Ht 5\' 7"  (1.702 m)   Wt 62.6 kg   SpO2 100%   BMI 21.61 kg/m  PHYSICAL EXAM:  General: Alert, cooperative, no distress, appears stated age.  Head: Normocephalic, without obvious abnormality, atraumatic. Eyes: Conjunctivae clear, anicteric sclerae. Pupils are equal ENT Nares normal. No drainage or sinus tenderness. Lips, mucosa, and tongue normal. No Thrush Neck: Supple, symmetrical, no adenopathy, thyroid: non tender no carotid bruit and no JVD. Back: No CVA tenderness. Lungs: Clear to auscultation bilaterally. No Wheezing or Rhonchi. No rales. PORT site normal Heart: Regular rate and rhythm, no murmur, rub or gallop. Abdomen: Soft, non-tender,not distended. Bowel sounds normal. No masses Extremities: atraumatic, no cyanosis. No edema. No clubbing Skin: No rashes or lesions. Or bruising Lymph: Cervical, supraclavicular normal. Neurologic: Grossly non-focal  Pertinent Labs Lab Results CBC    Component Value Date/Time   WBC 8.2 03/19/2020 1024   RBC 3.43 (L) 03/19/2020 1024   HGB 11.4 (L) 03/19/2020 1024   HGB 13.9 02/06/2020 1049   HCT  30.8 (L) 03/19/2020 1024   HCT 41.7 02/06/2020 1049   PLT 191 03/19/2020 1024   PLT 221 02/06/2020 1049   MCV 89.8 03/19/2020 1024   MCV 94 02/06/2020 1049   MCH 33.2 03/19/2020 1024   MCHC 37.0 (H) 03/19/2020 1024   RDW 12.6 03/19/2020 1024   RDW 12.6 02/06/2020 1049   LYMPHSABS 0.7 03/19/2020 1024   LYMPHSABS 1.4 02/06/2020 1049   MONOABS 0.4 03/19/2020 1024   EOSABS 0.0 03/19/2020 1024   EOSABS 0.1 02/06/2020 1049   BASOSABS 0.0 03/19/2020 1024   BASOSABS 0.0 02/06/2020 1049    CMP Latest Ref Rng & Units 03/19/2020 03/18/2020 03/18/2020  Glucose 70 - 99 mg/dL 145(H) 138(H) 134(H)  BUN 8 - 23 mg/dL 11 12 12   Creatinine 0.44 - 1.00 mg/dL 0.85 0.81 0.69  Sodium 135 - 145 mmol/L 129(L) 132(L) 132(L)  Potassium 3.5 - 5.1 mmol/L 3.8 4.0 4.0  Chloride 98 - 111 mmol/L 95(L) 98 98  CO2 22 - 32 mmol/L 25 24 24   Calcium 8.9 - 10.3 mg/dL 8.9 8.5(L) 8.6(L)  Total Protein 6.5 - 8.1 g/dL 7.1 6.7 6.8  Total Bilirubin 0.3 - 1.2 mg/dL 1.5(H) 1.7(H) 1.9(H)  Alkaline Phos 38 - 126 U/L 125 124 123  AST 15 - 41 U/L 30 20 21   ALT 0 - 44 U/L 43 36 37      Microbiology: Recent Results (from the past 240 hour(s))  SARS Coronavirus 2 by RT PCR (hospital order, performed in [facility] hospital lab) Nasopharyngeal Nasopharyngeal Swab     Status: None   Collection Time: 03/11/20  8:54 PM   Specimen: Nasopharyngeal Swab  Result Value Ref Range Status   SARS Coronavirus 2 NEGATIVE NEGATIVE Final    Comment: (NOTE) SARS-CoV-2 target nucleic acids are NOT DETECTED.  The SARS-CoV-2 RNA is generally detectable in upper and lower respiratory specimens during the acute phase of infection. The lowest concentration of SARS-CoV-2 viral copies this assay can detect is 250 copies / mL. A negative result does not preclude SARS-CoV-2 infection and should not be used as the sole basis for treatment or other patient management decisions.  A negative result may occur with improper specimen collection /  handling, submission of specimen other than nasopharyngeal swab, presence of viral mutation(s) within the areas targeted by this assay, and inadequate number of viral copies (<250 copies / mL). A negative result must be combined with clinical observations, patient history, and epidemiological information.  Fact Sheet for Patients:   StrictlyIdeas.no  Fact Sheet for Healthcare Providers: BankingDealers.co.za  This test is not yet approved or  cleared by the [place] FDA and has been authorized for detection and/or diagnosis of SARS-CoV-2 by FDA under an Emergency Use Authorization (EUA).  This EUA will remain in effect (meaning this test can be used) for the duration of the COVID-19 declaration under Section 564(b)(1) of the Act, 21 U.S.C. section 360bbb-3(b)(1), unless the authorization is terminated or revoked sooner.  Performed at [facility], [address], [place], [place] [address]   Urine culture     Status: Abnormal   Collection Time: 03/18/20  1:32 PM   Specimen: Urine, Clean Catch  Result Value Ref Range Status   Specimen Description   Final  URINE, CLEAN CATCH Performed at [facility], [address] [place], [place] [address]    Special Requests   Final    NONE Performed at [facility], [address] [place], [place] [address]    Culture MULTIPLE SPECIES PRESENT, SUGGEST RECOLLECTION (A)  Final   Report Status 03/19/2020 FINAL  Final  Culture, blood (routine x 2)     Status: None (Preliminary result)   Collection Time: 03/18/20  2:00 PM   Specimen: BLOOD  Result Value Ref Range Status   Specimen Description   Final    BLOOD RIGHT ARM Performed at [facility], [address], [place], [place] [address]    Special Requests   Final    BOTTLES DRAWN AEROBIC AND ANAEROBIC Blood Culture adequate volume Performed at [facility], [address], [place], [place]  [address]    Culture  Setup Time   Final    GRAM POSITIVE COCCI CRITICAL RESULT CALLED TO, READ BACK BY AND VERIFIED WITH: JASON ROBBINS @ 2130 03/19/20 RH IN BOTH AEROBIC AND ANAEROBIC BOTTLES Performed at [facility], [place] [address], [place], [place] [address]    Culture GRAM POSITIVE COCCI  Final   Report Status PENDING  Incomplete  Culture, blood (routine x 2)     Status: None (Preliminary result)   Collection Time: 03/18/20  2:15 PM   Specimen: BLOOD  Result Value Ref Range Status   Specimen Description   Final    BLOOD LEFT ARM Performed at [facility], [address], [place], [place] [address]    Special Requests   Final    BOTTLES DRAWN AEROBIC AND ANAEROBIC Blood Culture adequate volume Performed at [facility], [address] [place], [place] [address]    Culture  Setup Time   Final    GRAM POSITIVE COCCI IN BOTH AEROBIC AND ANAEROBIC BOTTLES CRITICAL RESULT CALLED TO, READ BACK BY AND VERIFIED WITH: JASON ROBBINS 03/19/20 1324 RH Performed at [facility], [place] [address] [place], [place] [address]    Culture GRAM POSITIVE COCCI  Final   Report Status PENDING  Incomplete  Blood Culture ID Panel (Reflexed)     Status: Abnormal   Collection Time: 03/18/20  2:15 PM  Result Value Ref Range Status   Enterococcus faecalis NOT DETECTED NOT DETECTED Final   Enterococcus Faecium NOT DETECTED NOT DETECTED Final   Listeria monocytogenes NOT DETECTED NOT DETECTED Final   Staphylococcus species NOT DETECTED NOT DETECTED Final   Staphylococcus aureus (BCID) NOT DETECTED NOT DETECTED Final   Staphylococcus epidermidis NOT DETECTED NOT DETECTED Final   Staphylococcus lugdunensis NOT DETECTED NOT DETECTED Final   Streptococcus species DETECTED (A) NOT DETECTED Final    Comment: CRITICAL RESULT CALLED TO, READ BACK BY AND VERIFIED WITH: JASON ROBBIN @ 7253 03/19/2020 RH    Streptococcus agalactiae NOT DETECTED NOT DETECTED Final   Streptococcus  pneumoniae DETECTED (A) NOT DETECTED Final    Comment: CRITICAL RESULT CALLED TO, READ BACK BY AND VERIFIED WITH: JASON ROBBINS @ [id number] 03/19/2020 RH    Streptococcus pyogenes NOT DETECTED NOT DETECTED Final   A.calcoaceticus-baumannii NOT DETECTED NOT DETECTED Final   Bacteroides fragilis NOT DETECTED NOT DETECTED Final   Enterobacterales NOT DETECTED NOT DETECTED Final   Enterobacter cloacae complex NOT DETECTED NOT DETECTED Final   Escherichia coli NOT DETECTED NOT DETECTED Final   Klebsiella aerogenes NOT DETECTED NOT DETECTED Final   Klebsiella oxytoca NOT DETECTED NOT  DETECTED Final   Klebsiella pneumoniae NOT DETECTED NOT DETECTED Final   Proteus species NOT DETECTED NOT DETECTED Final   Salmonella species NOT DETECTED NOT DETECTED Final   Serratia marcescens NOT DETECTED NOT DETECTED Final   Haemophilus influenzae NOT DETECTED NOT DETECTED Final   Neisseria meningitidis NOT DETECTED NOT DETECTED Final   Pseudomonas aeruginosa NOT DETECTED NOT DETECTED Final   Stenotrophomonas maltophilia NOT DETECTED NOT DETECTED Final   Candida albicans NOT DETECTED NOT DETECTED Final   Candida auris NOT DETECTED NOT DETECTED Final   Candida glabrata NOT DETECTED NOT DETECTED Final   Candida krusei NOT DETECTED NOT DETECTED Final   Candida parapsilosis NOT DETECTED NOT DETECTED Final   Candida tropicalis NOT DETECTED NOT DETECTED Final   Cryptococcus neoformans/gattii NOT DETECTED NOT DETECTED Final    Comment: Performed at [facility], [address] [place], [place] [address]  SARS Coronavirus 2 by RT PCR (hospital order, performed in [facility] hospital lab) Nasopharyngeal Nasopharyngeal Swab     Status: None   Collection Time: 03/19/20 10:24 AM   Specimen: Nasopharyngeal Swab  Result Value Ref Range Status   SARS Coronavirus 2 NEGATIVE NEGATIVE Final    Comment: (NOTE) SARS-CoV-2 target nucleic acids are NOT DETECTED.  The SARS-CoV-2 RNA is generally detectable in upper and  lower respiratory specimens during the acute phase of infection. The lowest concentration of SARS-CoV-2 viral copies this assay can detect is 250 copies / mL. A negative result does not preclude SARS-CoV-2 infection and should not be used as the sole basis for treatment or other patient management decisions.  A negative result may occur with improper specimen collection / handling, submission of specimen other than nasopharyngeal swab, presence of viral mutation(s) within the areas targeted by this assay, and inadequate number of viral copies (<250 copies / mL). A negative result must be combined with clinical observations, patient history, and epidemiological information.  Fact Sheet for Patients:   StrictlyIdeas.no  Fact Sheet for Healthcare Providers: BankingDealers.co.za  This test is not yet approved or  cleared by the [place] FDA and has been authorized for detection and/or diagnosis of SARS-CoV-2 by FDA under an Emergency Use Authorization (EUA).  This EUA will remain in effect (meaning this test can be used) for the duration of the COVID-19 declaration under Section 564(b)(1) of the Act, 21 U.S.C. section 360bbb-3(b)(1), unless the authorization is terminated or revoked sooner.  Performed at [facility], [address] [place], [place] [address]     IMAGING RESULTS: I have personally reviewed the films ? Impression/Recommendation ? ?Strep pneumo bacteremia.  There is no pneumonia.  She has a port With recent TIA-like symptoms for which she was admitted between 9/14 -03/12/2020 she will have to undergo  TEE to rule out vegetations and ASD. A recent 2D echo on 03/12/2020 was normal. Continue ceftriaxone Watch for development of pneumonia  Pancreatic carcinoma With biliary obstruction status post stent Started first cycle of chemo on 9/14 Port placed on  9/10  ? ___________________________________________________ Discussed with patient,her husband and hospitalist Note:  This document was prepared using Dragon voice recognition s requesting provideroftware and may include unintentional dictation errors.

## 2020-03-19 NOTE — ED Notes (Signed)
Pt provided lunch tray.

## 2020-03-19 NOTE — ED Notes (Signed)
Pt returned from xray; admitting physician bedside for evaluation

## 2020-03-19 NOTE — Progress Notes (Signed)
PHARMACY - PHYSICIAN COMMUNICATION CRITICAL VALUE ALERT - BLOOD CULTURE IDENTIFICATION (BCID)  Blood cultures drawn 9/21 at outpatient, returned with GPC and BCID detecting S. Pneumoniae.    Result called to pharmacy last night.    Dr Tasia Catchings with Oncology is aware of result.  Doreene Eland, PharmD, BCPS.   Work Cell: [phone] 03/19/2020 8:52 AM

## 2020-03-19 NOTE — H&P (Signed)
History and Physical    Cassandra [name] WOE:[id number] DOB: 06-02-1956 DOA: 03/19/2020  Referring MD/NP/PA:   PCP: Doreen Beam, FNP   Patient coming from:  The patient is coming from home.  At baseline, pt is independent for most of ADL.        Chief Complaint: Fatigue, fever  HPI: Cassandra [name] is a 64 y.o. female with medical history significant of stage IV pancreatic cancer on chemotherapy, TIA, HLD, presents with fatigue and fever.  Patient states she has been having generalized weakness and fatigue in the past several days, which has been progressively worsening.  Patient has poor appetite and decreased oral intake.  Patient also has fever and chills. Her body temperature was 101.3 in ED todasy.  Patient has nausea, no vomiting, abdominal pain.  Patient had one loose stool bowel movement earlier today.  Currently no diarrhea.  Denies chest pain, shortness breath, cough.  Patient has mild burning on urination, denies dysuria or hematuria. Pt had blood culture done in cancer center yesterday, and was found to have positive blood culture with streptococcus pneumonia.   ED Course: pt was found to have WBC 8.2, negative COVID-19 PCR, lactic acid 1.3, sodium 129, renal function okay, Urinalysis on 03/18/2020 wih hazy appearance, small amount of leukocyte, no bacteria and WBC 21-50. Temperature 101.3, tachycardia with heart rate 114, blood pressure 120/84, RR 18, oxygen saturation 98% on room air.  Pending chest x-ray.  Patient is admitted to [facility] bed as inpatient.  Dr. Delaine Lame of infectious disease and Dr. Tasia Catchings of oncology are consulted.  Review of Systems:   General: has fevers, chills, no body weight gain, has poor appetite, has fatigue HEENT: no blurry vision, hearing changes or sore throat Respiratory: no dyspnea, coughing, wheezing CV: no chest pain, no palpitations GI: has nausea, no vomiting, abdominal pain, diarrhea, constipation GU: no dysuria, has burning on urination, no  increased urinary frequency, hematuria  Ext: no leg edema Neuro: no unilateral weakness, numbness, or tingling, no vision change or hearing loss Skin: no rash, no skin tear. MSK: No muscle spasm, no deformity, no limitation of range of movement in spin Heme: No easy bruising.  Travel history: No recent long distant travel.  Allergy:  Allergies  Allergen Reactions  . Penicillin G Hives and Swelling    Rxn as a child    Past Medical History:  Diagnosis Date  . Cancer [facility])    pancreatic cancer  . Colon polyps   . Osteopenia after menopause 05/2017   femoral neck T score -2.0    Past Surgical History:  Procedure Laterality Date  . COLONOSCOPY  07/2015   WNL  . COLONOSCOPY  2008/2011  . OVARIAN CYST REMOVAL  1992   dermoid-Dr [place]  . PORTA CATH INSERTION N/A 03/07/2020   Procedure: PORTA CATH INSERTION;  Surgeon: Algernon Huxley, MD;  Location: [facility];  Service: Cardiovascular;  Laterality: N/A;  . TUBAL LIGATION  1993    Social History:  reports that she quit smoking about 34 years ago. Her smoking use included cigarettes. She has a 5.00 pack-year smoking history. She has never used smokeless tobacco. She reports previous alcohol use. She reports that she does not use drugs.  Family History:  Family History  Problem Relation Age of Onset  . Breast cancer Paternal Aunt 72  . Diabetes Mother   . Osteoporosis Mother   . Hyperlipidemia Father   . Rheumatic fever Father   . Valvular heart  disease Father   . Colon cancer Paternal Grandmother   . Breast cancer Sister 43     Prior to Admission medications   Medication Sig Start Date End Date Taking? Authorizing Provider  aspirin EC 81 MG tablet Take 1 tablet (81 mg total) by mouth daily. Swallow whole. Patient not taking: Reported on 03/14/2020 03/12/20 03/12/21  Sidney Ace, MD  atorvastatin (LIPITOR) 40 MG tablet Take 1 tablet (40 mg total) by mouth daily. Patient not taking: Reported on 03/14/2020 03/13/20  04/12/20  Sidney Ace, MD  calcium-vitamin D (OSCAL WITH D) 500-200 MG-UNIT TABS tablet Take 1 tablet by mouth daily.  Patient not taking: Reported on 03/18/2020    [provider]  Cholecalciferol 25 MCG (1000 UT) tablet Take 2,000 Units by mouth daily.    [provider]  cholestyramine Lucrezia Starch) 4 GM/DOSE powder Take by mouth. PRN 03/14/20   [provider]  lidocaine-prilocaine (EMLA) cream Apply to affected area once 02/29/20   [name], MD  loperamide (IMODIUM A-D) 2 MG tablet Take 2 at onset of diarrhea, then 1 every 2hrs until 12hr without a BM. May take 2 tab every 4hrs at bedtime. If diarrhea recurs repeat. Patient not taking: Reported on 03/18/2020 02/29/20   [name], MD  Multiple Vitamins-Minerals (MULTIVITAMIN ADULT PO) Take by mouth.    [provider]  ondansetron (ZOFRAN) 8 MG tablet Take 1 tablet (8 mg total) by mouth 2 (two) times daily as needed. Start on day 3 after chemotherapy. 02/29/20   [name], MD  prochlorperazine (COMPAZINE) 10 MG tablet Take 1 tablet (10 mg total) by mouth every 6 (six) hours as needed (Nausea or vomiting). 02/29/20   [name], MD  simethicone (GAS-X) 80 MG chewable tablet Chew 1 tablet (80 mg total) by mouth every 8 (eight) hours as needed for flatulence. 03/18/20   [name], MD    Physical Exam: Vitals:   03/19/20 0347 03/19/20 0922 03/19/20 1100 03/19/20 1200  BP: 120/84  111/80 130/80  Pulse: 95  81 83  Resp:  18    Temp: 98.3 F (36.8 C)   99.4 F (37.4 C)  TempSrc: Oral   Oral  SpO2: 98%  100% 100%  Weight: 62.6 kg     Height: 5\' 7"  (1.702 m)      General: Not in acute distress HEENT:       Eyes: PERRL, EOMI, no scleral icterus.       ENT: No discharge from the ears and nose, no pharynx injection, no tonsillar enlargement.        Neck: No JVD, no bruit, no mass felt. Heme: No neck lymph node enlargement. Cardiac: S1/S2, RRR, No murmurs, No gallops or rubs. Respiratory:  No rales, wheezing, rhonchi  or rubs. Chest wall: has Port on the right upper chest with clean surroundings, no tenderness. GI: Soft, nondistended, nontender, no rebound pain, no organomegaly, BS present. GU: No hematuria Ext: No pitting leg edema bilaterally. 2+DP/PT pulse bilaterally. Musculoskeletal: No joint deformities, No joint redness or warmth, no limitation of ROM in spin. Skin: No rashes.  Neuro: Alert, oriented X3, cranial nerves II-XII grossly intact, moves all extremities normally.  Psych: Patient is not psychotic, no suicidal or hemocidal ideation.  Labs on Admission: I have personally reviewed following labs and imaging studies  CBC: Recent Labs  Lab 03/14/20 0813 03/18/20 1257 03/19/20 1024  WBC 4.8 10.3 8.2  NEUTROABS 3.4 8.5* 7.0  HGB 11.4* 11.9* 11.4*  HCT 31.8*  31.8* 30.8*  MCV 90.3 86.6 89.8  PLT 253 256 914   Basic Metabolic Panel: Recent Labs  Lab 03/13/20 0900 03/14/20 0813 03/18/20 1257 03/18/20 1415 03/19/20 1024  NA  --  134* 132* 132* 129*  K  --  3.8 4.0 4.0 3.8  CL  --  99 98 98 95*  CO2  --  27 24 24 25   GLUCOSE  --  215* 134* 138* 145*  BUN  --  14 12 12 11   CREATININE 0.80 0.68 0.69 0.81 0.85  CALCIUM  --  8.4* 8.6* 8.5* 8.9   GFR: Estimated Creatinine Clearance: 65 mL/min (by C-G formula based on SCr of 0.85 mg/dL). Liver Function Tests: Recent Labs  Lab 03/14/20 0813 03/18/20 1257 03/18/20 1415 03/19/20 1024  AST 34 21 20 30   ALT 49* 37 36 43  ALKPHOS 133* 123 124 125  BILITOT 1.9* 1.9* 1.7* 1.5*  PROT 6.2* 6.8 6.7 7.1  ALBUMIN 3.2* 3.4* 3.6 3.5   No results for input(s): LIPASE, AMYLASE in the last 168 hours. No results for input(s): AMMONIA in the last 168 hours. Coagulation Profile: No results for input(s): INR, PROTIME in the last 168 hours. Cardiac Enzymes: Recent Labs  Lab 03/12/20 1340  CKTOTAL 48   BNP (last 3 results) No results for input(s): PROBNP in the last 8760 hours. HbA1C: No results for input(s): HGBA1C in the last 72  hours. CBG: No results for input(s): GLUCAP in the last 168 hours. Lipid Profile: No results for input(s): CHOL, HDL, LDLCALC, TRIG, CHOLHDL, LDLDIRECT in the last 72 hours. Thyroid Function Tests: No results for input(s): TSH, T4TOTAL, FREET4, T3FREE, THYROIDAB in the last 72 hours. Anemia Panel: No results for input(s): VITAMINB12, FOLATE, FERRITIN, TIBC, IRON, RETICCTPCT in the last 72 hours. Urine analysis:    Component Value Date/Time   COLORURINE AMBER (A) 03/18/2020 1332   APPEARANCEUR HAZY (A) 03/18/2020 1332   LABSPEC 1.023 03/18/2020 1332   PHURINE 6.0 03/18/2020 1332   GLUCOSEU NEGATIVE 03/18/2020 1332   HGBUR NEGATIVE 03/18/2020 1332   BILIRUBINUR NEGATIVE 03/18/2020 1332   BILIRUBINUR Positive 02/06/2020 1022   KETONESUR 20 (A) 03/18/2020 1332   PROTEINUR 30 (A) 03/18/2020 1332   UROBILINOGEN 0.2 02/06/2020 1022   NITRITE NEGATIVE 03/18/2020 1332   LEUKOCYTESUR SMALL (A) 03/18/2020 1332   Sepsis Labs: @LABRCNTIP (procalcitonin:4,lacticidven:4) ) Recent Results (from the past 240 hour(s))  SARS Coronavirus 2 by RT PCR (hospital order, performed in [facility] hospital lab) Nasopharyngeal Nasopharyngeal Swab     Status: None   Collection Time: 03/11/20  8:54 PM   Specimen: Nasopharyngeal Swab  Result Value Ref Range Status   SARS Coronavirus 2 NEGATIVE NEGATIVE Final    Comment: (NOTE) SARS-CoV-2 target nucleic acids are NOT DETECTED.  The SARS-CoV-2 RNA is generally detectable in upper and lower respiratory specimens during the acute phase of infection. The lowest concentration of SARS-CoV-2 viral copies this assay can detect is 250 copies / mL. A negative result does not preclude SARS-CoV-2 infection and should not be used as the sole basis for treatment or other patient management decisions.  A negative result may occur with improper specimen collection / handling, submission of specimen other than nasopharyngeal swab, presence of viral mutation(s) within  the areas targeted by this assay, and inadequate number of viral copies (<250 copies / mL). A negative result must be combined with clinical observations, patient history, and epidemiological information.  Fact Sheet for Patients:   StrictlyIdeas.no  Fact Sheet for Healthcare  Providers: BankingDealers.co.za  This test is not yet approved or  cleared by the [place] and has been authorized for detection and/or diagnosis of SARS-CoV-2 by FDA under an Emergency Use Authorization (EUA).  This EUA will remain in effect (meaning this test can be used) for the duration of the COVID-19 declaration under Section 564(b)(1) of the Act, 21 U.S.C. section 360bbb-3(b)(1), unless the authorization is terminated or revoked sooner.  Performed at [facility], [address], [place], [place] [address]   Urine culture     Status: Abnormal   Collection Time: 03/18/20  1:32 PM   Specimen: Urine, Clean Catch  Result Value Ref Range Status   Specimen Description   Final    URINE, CLEAN CATCH Performed at [facility], [address], [place], [place] [address]    Special Requests   Final    NONE Performed at [facility], [address] [place], [place] [address]    Culture MULTIPLE SPECIES PRESENT, SUGGEST RECOLLECTION (A)  Final   Report Status 03/19/2020 FINAL  Final  Culture, blood (routine x 2)     Status: None (Preliminary result)   Collection Time: 03/18/20  2:00 PM   Specimen: BLOOD  Result Value Ref Range Status   Specimen Description   Final    BLOOD RIGHT ARM Performed at [facility], [address], [place], [place] [address]    Special Requests   Final    BOTTLES DRAWN AEROBIC AND ANAEROBIC Blood Culture adequate volume Performed at [facility], [address] [place], [place] [address]    Culture  Setup Time   Final    GRAM POSITIVE COCCI CRITICAL RESULT CALLED TO, READ BACK BY  AND VERIFIED WITH: JASON ROBBINS @ [phone] 03/19/20 RH IN BOTH AEROBIC AND ANAEROBIC BOTTLES Performed at [facility], [place] [address] [place], [place] [address]    Culture GRAM POSITIVE COCCI  Final   Report Status PENDING  Incomplete  Culture, blood (routine x 2)     Status: None (Preliminary result)   Collection Time: 03/18/20  2:15 PM   Specimen: BLOOD  Result Value Ref Range Status   Specimen Description   Final    BLOOD LEFT ARM Performed at [facility], [address], [place], [place] [address]    Special Requests   Final    BOTTLES DRAWN AEROBIC AND ANAEROBIC Blood Culture adequate volume Performed at [facility], [address] [place], [place] [address]    Culture  Setup Time   Final    GRAM POSITIVE COCCI IN BOTH AEROBIC AND ANAEROBIC BOTTLES CRITICAL RESULT CALLED TO, READ BACK BY AND VERIFIED WITH: JASON ROBBINS 03/19/20 2353 RH Performed at [facility], [place] [address] [place], [place] [address]    Culture GRAM POSITIVE COCCI  Final   Report Status PENDING  Incomplete  Blood Culture ID Panel (Reflexed)     Status: Abnormal   Collection Time: 03/18/20  2:15 PM  Result Value Ref Range Status   Enterococcus faecalis NOT DETECTED NOT DETECTED Final   Enterococcus Faecium NOT DETECTED NOT DETECTED Final   Listeria monocytogenes NOT DETECTED NOT DETECTED Final   Staphylococcus species NOT DETECTED NOT DETECTED Final   Staphylococcus aureus (BCID) NOT DETECTED NOT DETECTED Final   Staphylococcus epidermidis NOT DETECTED NOT DETECTED Final   Staphylococcus lugdunensis NOT DETECTED NOT DETECTED Final   Streptococcus species DETECTED (A) NOT DETECTED Final    Comment:  CRITICAL RESULT CALLED TO, READ BACK BY AND VERIFIED WITH: JASON ROBBIN @ 1856 03/19/2020 RH    Streptococcus agalactiae NOT DETECTED NOT DETECTED Final   Streptococcus pneumoniae DETECTED (A) NOT DETECTED Final    Comment: CRITICAL RESULT CALLED TO, READ BACK BY AND VERIFIED  WITH: JASON ROBBINS @ [phone] 03/19/2020 RH    Streptococcus pyogenes NOT DETECTED NOT DETECTED Final   A.calcoaceticus-baumannii NOT DETECTED NOT DETECTED Final   Bacteroides fragilis NOT DETECTED NOT DETECTED Final   Enterobacterales NOT DETECTED NOT DETECTED Final   Enterobacter cloacae complex NOT DETECTED NOT DETECTED Final   Escherichia coli NOT DETECTED NOT DETECTED Final   Klebsiella aerogenes NOT DETECTED NOT DETECTED Final   Klebsiella oxytoca NOT DETECTED NOT DETECTED Final   Klebsiella pneumoniae NOT DETECTED NOT DETECTED Final   Proteus species NOT DETECTED NOT DETECTED Final   Salmonella species NOT DETECTED NOT DETECTED Final   Serratia marcescens NOT DETECTED NOT DETECTED Final   Haemophilus influenzae NOT DETECTED NOT DETECTED Final   Neisseria meningitidis NOT DETECTED NOT DETECTED Final   Pseudomonas aeruginosa NOT DETECTED NOT DETECTED Final   Stenotrophomonas maltophilia NOT DETECTED NOT DETECTED Final   Candida albicans NOT DETECTED NOT DETECTED Final   Candida auris NOT DETECTED NOT DETECTED Final   Candida glabrata NOT DETECTED NOT DETECTED Final   Candida krusei NOT DETECTED NOT DETECTED Final   Candida parapsilosis NOT DETECTED NOT DETECTED Final   Candida tropicalis NOT DETECTED NOT DETECTED Final   Cryptococcus neoformans/gattii NOT DETECTED NOT DETECTED Final    Comment: Performed at [facility], [address] [place], [place] [address]  SARS Coronavirus 2 by RT PCR (hospital order, performed in [facility] hospital lab) Nasopharyngeal Nasopharyngeal Swab     Status: None   Collection Time: 03/19/20 10:24 AM   Specimen: Nasopharyngeal Swab  Result Value Ref Range Status   SARS Coronavirus 2 NEGATIVE NEGATIVE Final    Comment: (NOTE) SARS-CoV-2 target nucleic acids are NOT DETECTED.  The SARS-CoV-2 RNA is generally detectable in upper and lower respiratory specimens during the acute phase of infection. The lowest concentration of SARS-CoV-2  viral copies this assay can detect is 250 copies / mL. A negative result does not preclude SARS-CoV-2 infection and should not be used as the sole basis for treatment or other patient management decisions.  A negative result may occur with improper specimen collection / handling, submission of specimen other than nasopharyngeal swab, presence of viral mutation(s) within the areas targeted by this assay, and inadequate number of viral copies (<250 copies / mL). A negative result must be combined with clinical observations, patient history, and epidemiological information.  Fact Sheet for Patients:   StrictlyIdeas.no  Fact Sheet for Healthcare Providers: BankingDealers.co.za  This test is not yet approved or  cleared by the [place] FDA and has been authorized for detection and/or diagnosis of SARS-CoV-2 by FDA under an Emergency Use Authorization (EUA).  This EUA will remain in effect (meaning this test can be used) for the duration of the COVID-19 declaration under Section 564(b)(1) of the Act, 21 U.S.C. section 360bbb-3(b)(1), unless the authorization is terminated or revoked sooner.  Performed at [facility], [address] [place], [place] [address]      Radiological Exams on Admission: DG Chest 2 View  Result Date: 03/19/2020 CLINICAL DATA:  Bacteremia, evaluate for pneumonia. EXAM: CHEST - 2 VIEW COMPARISON:  Prior chest CT 03/13/2020. FINDINGS: A right chest infusion port catheter is present with tip  terminating in the region of the superior cavoatrial junction. Heart size within normal limits. Aortic atherosclerosis. No appreciable airspace consolidation. No evidence of pleural effusion or pneumothorax. No acute bony abnormality identified. IMPRESSION: No evidence of active cardiopulmonary disease. Aortic Atherosclerosis (ICD10-I70.0). Electronically Signed   By: Kellie Simmering DO   On: 03/19/2020 12:19     EKG:  Not done in ED, will get one.   Assessment/Plan Principal Problem:   Bacteremia due to Streptococcus pneumoniae Active Problems:   Primary pancreatic cancer  (HCC)   TIA (transient ischemic attack)   Sepsis ([place])   HLD (hyperlipidemia)   Hyponatremia   UTI (urinary tract infection)   Sepsis 2/2 bacteremia due to Streptococcus pneumonia: Patient meets criteria for sepsis with tachycardia with heart rate 114, and fever of 101.3.  Lactic acid normal.  Currently hemodynamically stable.  Location for infection source is not clear.  Pt has possible UTI. She has burning on urination.  Urinalysis showed hazy appearance, small amount of leukocyte, no bacteria and WBC 21-50. Not sure if UTI is the only source of infection. She has Port on the right side of upper chest which may need to be removed. Dr. Delaine Lame of infectious disease is consulted.   -will admit to med-surg bed as inpt -IV Rocephin -Follow-up blood culture, urine culture -Follow-up chest x-ray -will get Procalcitonin -IVF: 1L of NS bolus in ED, followed by 75 cc/h  -Patient may need TTE versus TEE -f/u CXR -->negative -f/u ID recommendations  Possible UTI: -on IV rocephin -f/u Bx and Ux  Primary pancreatic cancer  [facility]): pt is on chemotherapy, being followed up by Dr. Tasia Catchings -consulted Dr. Tasia Catchings  TIA (transient ischemic attack) - pt has not started taking Lipitor and ASA yet  HLD (hyperlipidemia) - has not started taking lipitor yet  Hyponatremia: likely due to poor oral intake and dehydration - Will check urine sodium, urine osmolality, serum osmolality. - IVF: 1L NS in ED, will continue with IV normal saline at 75 mL/h - f/u by BMP q8h - avoid over correction too fast due to risk of central pontine myelinolysis    DVT ppx: SQ Lovenox Code Status: Full code Family Communication: not done, no family member is at bed side.     Disposition Plan:  Anticipate discharge back to previous environment Consults called:  Dr.  Delaine Lame of infectious disease and Dr. Tasia Catchings of oncology are consulted. Admission status: Med-surg bed as inpt     Status is: Inpatient  Remains inpatient appropriate because:Inpatient level of care appropriate due to severity of illness.  Patient has multiple comorbidities, including stage IV pancreatic cancer, now presents with sepsis due to bacteremia 2/2 Streptococcus pneumonia.  Patient is on chemotherapy and immunosuppressed.  Her presentation is highly complicated.  Patient is at high risk for deteriorating.  Will need to be treated in hospital for at least 2 days.   Dispo: The patient is from: Home              Anticipated d/c is to: Home              Anticipated d/c date is: 2 days              Patient currently is not medically stable to d/c.           Date of Service 03/19/2020    Ivor Costa [facility]   If 7PM-7AM, please contact night-coverage [email or web address] 03/19/2020, 12:26 PM

## 2020-03-19 NOTE — ED Triage Notes (Signed)
Pt sent over from cancer center for positive culture. Pt reports being told that she needed to be admitted to receive IV antibiotics. Currently receiving IV chemotherapy to pancreatic cancer. Pt reports having increased fatigue starting on Monday. NAD noted at this time.

## 2020-03-19 NOTE — ED Notes (Signed)
Patient transported to X-ray 

## 2020-03-19 NOTE — Telephone Encounter (Signed)
Foundation results scanned in media.

## 2020-03-19 NOTE — Progress Notes (Signed)
   03/19/20 2313  Assess: MEWS Score  Temp (!) 103 F (39.4 C)  BP 122/85  Pulse Rate (!) 104  Resp 18  SpO2 100 %  O2 Device Room Air  Assess: MEWS Score  MEWS Temp 2  MEWS Systolic 0  MEWS Pulse 1  MEWS RR 0  MEWS LOC 0  MEWS Score 3  MEWS Score Color Yellow  Assess: if the MEWS score is Yellow or Red  Were vital signs taken at a resting state? Yes  Focused Assessment Change from prior assessment (see assessment flowsheet)  Early Detection of Sepsis Score *See Row Information* High  MEWS guidelines implemented *See Row Information* Yes  Treat  MEWS Interventions Administered prn meds/treatments  Pain Scale 0-10  Pain Score 0  Take Vital Signs  Increase Vital Sign Frequency  Yellow: Q 2hr X 2 then Q 4hr X 2, if remains yellow, continue Q 4hrs  Escalate  MEWS: Escalate Yellow: discuss with charge nurse/RN and consider discussing with provider and RRT  Notify: Charge Nurse/RN  Name of Charge Nurse/RN Notified DKrista Blue, RN   Date Charge Nurse/RN Notified 03/19/20  Time Charge Nurse/RN Notified 2320  Prn administered.  Patient denies pain at this time, currently resting peacefully. Will continue to monitor to ensure comfort and safety.

## 2020-03-20 ENCOUNTER — Ambulatory Visit: Payer: Self-pay | Admitting: Cardiovascular Disease

## 2020-03-20 DIAGNOSIS — N3000 Acute cystitis without hematuria: Secondary | ICD-10-CM

## 2020-03-20 DIAGNOSIS — A408 Other streptococcal sepsis: Secondary | ICD-10-CM

## 2020-03-20 LAB — CBC
HCT: 25.6 % — ABNORMAL LOW (ref 36.0–46.0)
Hemoglobin: 9.5 g/dL — ABNORMAL LOW (ref 12.0–15.0)
MCH: 33.6 pg (ref 26.0–34.0)
MCHC: 37.1 g/dL — ABNORMAL HIGH (ref 30.0–36.0)
MCV: 90.5 fL (ref 80.0–100.0)
Platelets: 156 10*3/uL (ref 150–400)
RBC: 2.83 MIL/uL — ABNORMAL LOW (ref 3.87–5.11)
RDW: 12.6 % (ref 11.5–15.5)
WBC: 4.7 10*3/uL (ref 4.0–10.5)
nRBC: 0 % (ref 0.0–0.2)

## 2020-03-20 LAB — BASIC METABOLIC PANEL
Anion gap: 7 (ref 5–15)
BUN: 7 mg/dL — ABNORMAL LOW (ref 8–23)
CO2: 25 mmol/L (ref 22–32)
Calcium: 8.3 mg/dL — ABNORMAL LOW (ref 8.9–10.3)
Chloride: 104 mmol/L (ref 98–111)
Creatinine, Ser: 0.66 mg/dL (ref 0.44–1.00)
GFR calc Af Amer: >60 mL/min (ref >60)
GFR calc non Af Amer: >60 mL/min (ref >60)
Glucose, Bld: 126 mg/dL — ABNORMAL HIGH (ref 70–99)
Potassium: 4 mmol/L (ref 3.5–5.1)
Sodium: 136 mmol/L (ref 135–145)

## 2020-03-20 MED ORDER — SODIUM CHLORIDE 0.9 % IV SOLN: INTRAVENOUS | Status: DC

## 2020-03-20 MED ORDER — CHLORHEXIDINE GLUCONATE CLOTH 2 % EX PADS
6.0000 | MEDICATED_PAD | Freq: Every day | CUTANEOUS | Status: DC
  Administered 2020-03-20: 6 via TOPICAL

## 2020-03-20 MED ORDER — ATORVASTATIN CALCIUM 20 MG PO TABS
40.0000 mg | ORAL_TABLET | Freq: Every day | ORAL | Status: DC
  Administered 2020-03-20 – 2020-03-21 (×2): 40 mg via ORAL
  Filled 2020-03-20 (×2): qty 2

## 2020-03-20 MED ORDER — ASPIRIN EC 81 MG PO TBEC
81.0000 mg | DELAYED_RELEASE_TABLET | Freq: Every day | ORAL | Status: DC
  Administered 2020-03-20 – 2020-03-21 (×2): 81 mg via ORAL
  Filled 2020-03-20 (×2): qty 1

## 2020-03-20 NOTE — Progress Notes (Signed)
Date of Admission:  03/19/2020      ID: Cassandra [name] is a 64 y.o. female  Principal Problem:   Bacteremia due to Streptococcus pneumoniae Active Problems:   Primary pancreatic cancer  (HCC)   TIA (transient ischemic attack)   Sepsis ([place])   HLD (hyperlipidemia)   Hyponatremia   UTI (urinary tract infection)    Subjective: Feeling better No fever today Energetic appetite better  Medications:  . aspirin EC  81 mg Oral Daily  . atorvastatin  40 mg Oral Daily  . cholecalciferol  2,000 Units Oral Daily  . cholestyramine  4 g Oral BID  . enoxaparin (LOVENOX) injection  40 mg Subcutaneous Q24H  . lidocaine-prilocaine  1 application Topical UD  . multivitamin with minerals  1 tablet Oral Daily    Objective: Vital signs in last 24 hours: Temp:  [97.8 F (36.6 C)-103 F (39.4 C)] 99.1 F (37.3 C) (09/23 0727) Pulse Rate:  [69-104] 70 (09/23 0727) Resp:  [15-18] 16 (09/23 0727) BP: (106-122)/(67-85) 112/73 (09/23 0727) SpO2:  [99 %-100 %] 99 % (09/23 0727)  PHYSICAL EXAM:  General: Alert, cooperative, no distress, appears stated age.  Head: Normocephalic, without obvious abnormality, atraumatic. Eyes: Conjunctivae clear, anicteric sclerae. Pupils are equal ENT Nares normal. No drainage or sinus tenderness. Lips, mucosa, and tongue normal. No Thrush Neck: Supple, symmetrical, no adenopathy, thyroid: non tender no carotid bruit and no JVD. PORT  Back: No CVA tenderness. Lungs: Clear to auscultation bilaterally. No Wheezing or Rhonchi. No rales. Heart: Regular rate and rhythm, no murmur, rub or gallop. Abdomen: Soft, non-tender,not distended. Bowel sounds normal. No masses Extremities: atraumatic, no cyanosis. No edema. No clubbing Skin: No rashes or lesions. Or bruising Lymph: Cervical, supraclavicular normal. Neurologic: Grossly non-focal  Lab Results Recent Labs    03/19/20 1024 03/20/20 0411  WBC 8.2 4.7  HGB 11.4* 9.5*  HCT 30.8* 25.6*  NA 129* 136  K  3.8 4.0  CL 95* 104  CO2 25 25  BUN 11 7*  CREATININE 0.85 0.66   Liver Panel Recent Labs    03/18/20 1415 03/19/20 1024  PROT 6.7 7.1  ALBUMIN 3.6 3.5  AST 20 30  ALT 36 43  ALKPHOS 124 125  BILITOT 1.7* 1.5*   Sedimentation Rate No results for input(s): ESRSEDRATE in the last 72 hours. C-Reactive Protein No results for input(s): CRP in the last 72 hours.  Microbiology:  Studies/Results: DG Chest 2 View  Result Date: 03/19/2020 CLINICAL DATA:  Bacteremia, evaluate for pneumonia. EXAM: CHEST - 2 VIEW COMPARISON:  Prior chest CT 03/13/2020. FINDINGS: A right chest infusion port catheter is present with tip terminating in the region of the superior cavoatrial junction. Heart size within normal limits. Aortic atherosclerosis. No appreciable airspace consolidation. No evidence of pleural effusion or pneumothorax. No acute bony abnormality identified. IMPRESSION: No evidence of active cardiopulmonary disease. Aortic Atherosclerosis (ICD10-I70.0). Electronically Signed   By: Kellie Simmering DO   On: 03/19/2020 12:19      Impression/Recommendation ? ?Strep pneumo bacteremia.  There is no pneumonia.  She has a port With recent TIA-like symptoms for which she was admitted between 9/14 -03/12/2020 she will have to undergo  TEE to rule out vegetations and ASD/patent foramen ovale. A recent 2D echo on 03/12/2020 was normal. 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

## 2020-03-20 NOTE — Progress Notes (Addendum)
PROGRESS NOTE    Cassandra [name]  GUY:[id number] DOB: 15-Nov-1955 DOA: 03/19/2020 PCP: Doreen Beam, FNP  Assessment & Plan:   Principal Problem:   Bacteremia due to Streptococcus pneumoniae Active Problems:   Primary pancreatic cancer  ([place])   TIA (transient ischemic attack)   Sepsis ([place])   HLD (hyperlipidemia)   Hyponatremia   UTI (urinary tract infection)   Sepsis: secondary bacteremia due to streptococcus pneumoniae. Meets criteria for sepsis with tachycardia, fever. Blood cxs growing strep pneumoniae, sens pending. Continue on IV rocephin. Urine cx needs to be recollected. Continue on IVFs  Bacteremia: secondary strep pneumoniae, sens pending. Continue on IV rocephin. Urine cx needs to be recollected. Continue on IVFs. ID following and recs apprec. Cardio consulted for TEE   Possible UTI: continue on IV rocephin. Urine cx needs to be recollected   Pancreatic cancer: stage IV, on chemotherapy. Onco following and recs apprec   Hx of TIA: will continue on aspirin, statin   HLD: will start statin   Hyponatremia: likely due to poor oral intake and dehydration. Resolved    DVT prophylaxis: lovenox Code Status: full  Family Communication: Disposition Plan:    Status is: Inpatient  Remains inpatient appropriate because:IV treatments appropriate due to intensity of illness or inability to take PO and Inpatient level of care appropriate due to severity of illness   Dispo: The patient is from: Home              Anticipated d/c is to: Home              Anticipated d/c date is: 3 days              Patient currently is not medically stable to d/c.        Consultants:   ID  Onco  Cardio    Procedures:   Antimicrobials: rocephin   Subjective: Pt c/o malaise   Objective: Vitals:   03/19/20 2313 03/20/20 0134 03/20/20 0301 03/20/20 0638  BP: 122/85  114/77 106/67  Pulse: (!) 104  82 69  Resp: 18  18   Temp: (!) 103 F (39.4 C) (!) 100.6 F  (38.1 C) 98.7 F (37.1 C) 97.8 F (36.6 C)  TempSrc: Oral Oral Oral Oral  SpO2: 100%  100% 100%  Weight:      Height:        Intake/Output Summary (Last 24 hours) at 03/20/2020 0727 Last data filed at 03/19/2020 1900 Gross per 24 hour  Intake 1200 ml  Output --  Net 1200 ml   Filed Weights   03/19/20 0900 03/19/20 0918  Weight: 62 kg 62.6 kg    Examination:  General exam: Appears calm and comfortable  Respiratory system: clear breath sounds b/l No rales, rhonchi  Cardiovascular system: S1 & S2 +. No rubs, gallops or clicks. . Gastrointestinal system: Abdomen is nondistended, soft and nontender.  Normal bowel sounds heard. Central nervous system: Alert and oriented. Moves all 4 extremities  Psychiatry: Judgement and insight appear normal. Mood & affect appropriate.     Data Reviewed: I have personally reviewed following labs and imaging studies  CBC: Recent Labs  Lab 03/14/20 0813 03/18/20 1257 03/19/20 1024 03/20/20 0411  WBC 4.8 10.3 8.2 4.7  NEUTROABS 3.4 8.5* 7.0  --   HGB 11.4* 11.9* 11.4* 9.5*  HCT 31.8* 31.8* 30.8* 25.6*  MCV 90.3 86.6 89.8 90.5  PLT 253 256 191 563   Basic Metabolic Panel: Recent Labs  Lab 03/14/20  5956 03/18/20 1257 03/18/20 1415 03/19/20 1024 03/20/20 0411  NA 134* 132* 132* 129* 136  K 3.8 4.0 4.0 3.8 4.0  CL 99 98 98 95* 104  CO2 27 24 24 25 25   GLUCOSE 215* 134* 138* 145* 126*  BUN 14 12 12 11  7*  CREATININE 0.68 0.69 0.81 0.85 0.66  CALCIUM 8.4* 8.6* 8.5* 8.9 8.3*   GFR: Estimated Creatinine Clearance: 69.1 mL/min (by C-G formula based on SCr of 0.66 mg/dL). Liver Function Tests: Recent Labs  Lab 03/14/20 0813 03/18/20 1257 03/18/20 1415 03/19/20 1024  AST 34 21 20 30   ALT 49* 37 36 43  ALKPHOS 133* 123 124 125  BILITOT 1.9* 1.9* 1.7* 1.5*  PROT 6.2* 6.8 6.7 7.1  ALBUMIN 3.2* 3.4* 3.6 3.5   No results for input(s): LIPASE, AMYLASE in the last 168 hours. No results for input(s): AMMONIA in the last 168  hours. Coagulation Profile: Recent Labs  Lab 03/19/20 1212  INR 1.3*   Cardiac Enzymes: No results for input(s): CKTOTAL, CKMB, CKMBINDEX, TROPONINI in the last 168 hours. BNP (last 3 results) No results for input(s): PROBNP in the last 8760 hours. HbA1C: No results for input(s): HGBA1C in the last 72 hours. CBG: No results for input(s): GLUCAP in the last 168 hours. Lipid Profile: No results for input(s): CHOL, HDL, LDLCALC, TRIG, CHOLHDL, LDLDIRECT in the last 72 hours. Thyroid Function Tests: No results for input(s): TSH, T4TOTAL, FREET4, T3FREE, THYROIDAB in the last 72 hours. Anemia Panel: No results for input(s): VITAMINB12, FOLATE, FERRITIN, TIBC, IRON, RETICCTPCT in the last 72 hours. Sepsis Labs: Recent Labs  Lab 03/19/20 1024 03/19/20 1212  PROCALCITON  --  0.55  LATICACIDVEN 1.2  --     Recent Results (from the past 240 hour(s))  SARS Coronavirus 2 by RT PCR (hospital order, performed in [facility] hospital lab) Nasopharyngeal Nasopharyngeal Swab     Status: None   Collection Time: 03/11/20  8:54 PM   Specimen: Nasopharyngeal Swab  Result Value Ref Range Status   SARS Coronavirus 2 NEGATIVE NEGATIVE Final    Comment: (NOTE) SARS-CoV-2 target nucleic acids are NOT DETECTED.  The SARS-CoV-2 RNA is generally detectable in upper and lower respiratory specimens during the acute phase of infection. The lowest concentration of SARS-CoV-2 viral copies this assay can detect is 250 copies / mL. A negative result does not preclude SARS-CoV-2 infection and should not be used as the sole basis for treatment or other patient management decisions.  A negative result may occur with improper specimen collection / handling, submission of specimen other than nasopharyngeal swab, presence of viral mutation(s) within the areas targeted by this assay, and inadequate number of viral copies (<250 copies / mL). A negative result must be combined with clinical observations, patient  history, and epidemiological information.  Fact Sheet for Patients:   StrictlyIdeas.no  Fact Sheet for Healthcare Providers: BankingDealers.co.za  This test is not yet approved or  cleared by the [place] FDA and has been authorized for detection and/or diagnosis of SARS-CoV-2 by FDA under an Emergency Use Authorization (EUA).  This EUA will remain in effect (meaning this test can be used) for the duration of the COVID-19 declaration under Section 564(b)(1) of the Act, 21 U.S.C. section 360bbb-3(b)(1), unless the authorization is terminated or revoked sooner.  Performed at [facility], [address] [place], [place] [address]   Urine culture     Status: Abnormal   Collection Time: 03/18/20  1:32 PM  Specimen: Urine, Clean Catch  Result Value Ref Range Status   Specimen Description   Final    URINE, CLEAN CATCH Performed at [facility], [address] [place], [place] [address]    Special Requests   Final    NONE Performed at [facility], [address] [place], [place] [address]    Culture MULTIPLE SPECIES PRESENT, SUGGEST RECOLLECTION (A)  Final   Report Status 03/19/2020 FINAL  Final  Culture, blood (routine x 2)     Status: None (Preliminary result)   Collection Time: 03/18/20  2:00 PM   Specimen: BLOOD  Result Value Ref Range Status   Specimen Description   Final    BLOOD RIGHT ARM Performed at [facility], [address] [place], [place] [address]    Special Requests   Final    BOTTLES DRAWN AEROBIC AND ANAEROBIC Blood Culture adequate volume Performed at [facility], [address], [place], [place] [address]    Culture  Setup Time   Final    GRAM POSITIVE COCCI CRITICAL RESULT CALLED TO, READ BACK BY AND VERIFIED WITH: JASON ROBBINS @ [id number] 03/19/20 RH IN BOTH AEROBIC AND ANAEROBIC BOTTLES Performed at [facility], [place] [address] [place], [place]  [address]    Culture GRAM POSITIVE COCCI  Final   Report Status PENDING  Incomplete  Culture, blood (routine x 2)     Status: None (Preliminary result)   Collection Time: 03/18/20  2:15 PM   Specimen: BLOOD  Result Value Ref Range Status   Specimen Description   Final    BLOOD LEFT ARM Performed at [facility], [address] [place], [place] [address]    Special Requests   Final    BOTTLES DRAWN AEROBIC AND ANAEROBIC Blood Culture adequate volume Performed at [facility], [address], [place], [place] [address]    Culture  Setup Time   Final    GRAM POSITIVE COCCI IN BOTH AEROBIC AND ANAEROBIC BOTTLES CRITICAL RESULT CALLED TO, READ BACK BY AND VERIFIED WITH: JASON ROBBINS 03/19/20 3500 RH Performed at [facility], [place] [address] [place], [place] [address]    Culture GRAM POSITIVE COCCI  Final   Report Status PENDING  Incomplete  Blood Culture ID Panel (Reflexed)     Status: Abnormal   Collection Time: 03/18/20  2:15 PM  Result Value Ref Range Status   Enterococcus faecalis NOT DETECTED NOT DETECTED Final   Enterococcus Faecium NOT DETECTED NOT DETECTED Final   Listeria monocytogenes NOT DETECTED NOT DETECTED Final   Staphylococcus species NOT DETECTED NOT DETECTED Final   Staphylococcus aureus (BCID) NOT DETECTED NOT DETECTED Final   Staphylococcus epidermidis NOT DETECTED NOT DETECTED Final   Staphylococcus lugdunensis NOT DETECTED NOT DETECTED Final   Streptococcus species DETECTED (A) NOT DETECTED Final    Comment: CRITICAL RESULT CALLED TO, READ BACK BY AND VERIFIED WITH: JASON ROBBIN @ [id number] 03/19/2020 RH    Streptococcus agalactiae NOT DETECTED NOT DETECTED Final   Streptococcus pneumoniae DETECTED (A) NOT DETECTED Final    Comment: CRITICAL RESULT CALLED TO, READ BACK BY AND VERIFIED WITH: JASON ROBBINS @ [id number] 03/19/2020 RH    Streptococcus pyogenes NOT DETECTED NOT DETECTED Final   A.calcoaceticus-baumannii NOT DETECTED NOT DETECTED Final    Bacteroides fragilis NOT DETECTED NOT DETECTED Final   Enterobacterales NOT DETECTED NOT DETECTED Final   Enterobacter cloacae complex NOT DETECTED NOT DETECTED Final   Escherichia coli NOT  DETECTED NOT DETECTED Final   Klebsiella aerogenes NOT DETECTED NOT DETECTED Final   Klebsiella oxytoca NOT DETECTED NOT DETECTED Final   Klebsiella pneumoniae NOT DETECTED NOT DETECTED Final   Proteus species NOT DETECTED NOT DETECTED Final   Salmonella species NOT DETECTED NOT DETECTED Final   Serratia marcescens NOT DETECTED NOT DETECTED Final   Haemophilus influenzae NOT DETECTED NOT DETECTED Final   Neisseria meningitidis NOT DETECTED NOT DETECTED Final   Pseudomonas aeruginosa NOT DETECTED NOT DETECTED Final   Stenotrophomonas maltophilia NOT DETECTED NOT DETECTED Final   Candida albicans NOT DETECTED NOT DETECTED Final   Candida auris NOT DETECTED NOT DETECTED Final   Candida glabrata NOT DETECTED NOT DETECTED Final   Candida krusei NOT DETECTED NOT DETECTED Final   Candida parapsilosis NOT DETECTED NOT DETECTED Final   Candida tropicalis NOT DETECTED NOT DETECTED Final   Cryptococcus neoformans/gattii NOT DETECTED NOT DETECTED Final    Comment: Performed at [facility], [address] [place], [place] [address]  SARS Coronavirus 2 by RT PCR (hospital order, performed in [place] hospital lab) Nasopharyngeal Nasopharyngeal Swab     Status: None   Collection Time: 03/19/20 10:24 AM   Specimen: Nasopharyngeal Swab  Result Value Ref Range Status   SARS Coronavirus 2 NEGATIVE NEGATIVE Final    Comment: (NOTE) SARS-CoV-2 target nucleic acids are NOT DETECTED.  The SARS-CoV-2 RNA is generally detectable in upper and lower respiratory specimens during the acute phase of infection. The lowest concentration of SARS-CoV-2 viral copies this assay can detect is 250 copies / mL. A negative result does not preclude SARS-CoV-2 infection and should not be used as the sole basis for  treatment or other patient management decisions.  A negative result may occur with improper specimen collection / handling, submission of specimen other than nasopharyngeal swab, presence of viral mutation(s) within the areas targeted by this assay, and inadequate number of viral copies (<250 copies / mL). A negative result must be combined with clinical observations, patient history, and epidemiological information.  Fact Sheet for Patients:   StrictlyIdeas.no  Fact Sheet for Healthcare Providers: BankingDealers.co.za  This test is not yet approved or  cleared by the [place] FDA and has been authorized for detection and/or diagnosis of SARS-CoV-2 by FDA under an Emergency Use Authorization (EUA).  This EUA will remain in effect (meaning this test can be used) for the duration of the COVID-19 declaration under Section 564(b)(1) of the Act, 21 U.S.C. section 360bbb-3(b)(1), unless the authorization is terminated or revoked sooner.  Performed at [facility], [address], [place], [place] [address]          Radiology Studies: DG Chest 2 View  Result Date: 03/19/2020 CLINICAL DATA:  Bacteremia, evaluate for pneumonia. EXAM: CHEST - 2 VIEW COMPARISON:  Prior chest CT 03/13/2020. FINDINGS: A right chest infusion port catheter is present with tip terminating in the region of the superior cavoatrial junction. Heart size within normal limits. Aortic atherosclerosis. No appreciable airspace consolidation. No evidence of pleural effusion or pneumothorax. No acute bony abnormality identified. IMPRESSION: No evidence of active cardiopulmonary disease. Aortic Atherosclerosis (ICD10-I70.0). Electronically Signed   By: Kellie Simmering DO   On: 03/19/2020 12:19        Scheduled Meds: . cholecalciferol  2,000 Units Oral Daily  . cholestyramine  4 g Oral BID  . enoxaparin (LOVENOX) injection  40 mg Subcutaneous Q24H  .  lidocaine-prilocaine  1 application Topical UD  . multivitamin with minerals  1 tablet Oral Daily   Continuous Infusions: . sodium chloride 75 mL/hr at 03/19/20 2320  . cefTRIAXone (ROCEPHIN)  IV       LOS: 1 day    Time spent: 35 mins    Wyvonnia Dusky, MD [facility] Pager [phone]  If 7PM-7AM, please contact night-coverage [email or web address] 03/20/2020, 7:27 AM

## 2020-03-20 NOTE — Progress Notes (Signed)
   03/20/20 0301  Vitals  Temp 98.7 F (37.1 C)  Temp Source Oral  BP 114/77  MAP (mmHg) 87  BP Location Left Arm  BP Method Automatic  Patient Position (if appropriate) Sitting  Pulse Rate 82  Resp 18  MEWS COLOR  MEWS Score Color Green  Oxygen Therapy  SpO2 100 %  O2 Device Room Air  MEWS Score  MEWS Temp 0  MEWS Systolic 0  MEWS Pulse 0  MEWS RR 0  MEWS LOC 0  MEWS Score 0

## 2020-03-20 NOTE — Consult Note (Signed)
Cassandra [name] is a 64 y.o. female  [id number]  Primary Cardiologist: Neoma Laming Reason for Consultation: Rule out endocarditis  HPI: Is a 64 year old pleasant white female who presented to the hospital with fever and was found to have bacteremia with streptococcal pneumonia and I was asked to evaluate the patient to rule out endocarditis.   Review of Systems: No chest pain or shortness of breath   Past Medical History:  Diagnosis Date  . [facility])    pancreatic cancer  . Colon polyps   . 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 Cholecalciferol 25 MCG (1000 UT) tablet Take 2,000 Units by mouth daily.    . cholestyramine (QUESTRAN) 4 GM/DOSE powder Take 4 g by mouth in the morning and at bedtime.     . lidocaine-prilocaine (EMLA) cream Apply to affected area once (Patient taking differently: Apply 1 application topically as directed. ) 30 g 3  . loperamide (IMODIUM A-D) 2 MG tablet Take 2 at onset of diarrhea, then 1 every 2hrs until 12hr without a BM. May take 2 tab every 4hrs at bedtime. If diarrhea recurs repeat. 100 tablet 1  . Multiple Vitamins-Minerals (MULTIVITAMIN ADULT PO) Take 1 tablet by mouth daily.     . ondansetron (ZOFRAN) 8 MG tablet Take 1 tablet (8 mg total) by mouth 2 (two) times daily as needed. Start on day 3 after chemotherapy. 30 tablet 1  . prochlorperazine (COMPAZINE) 10 MG tablet Take 1 tablet (10 mg total) by mouth every 6 (six) hours as needed (Nausea or vomiting). 30 tablet 1  . simethicone (GAS-X) 80 MG chewable tablet Chew 1 tablet (80 mg total) by mouth every 8 (eight) hours as needed for flatulence. 60 tablet 0  . aspirin EC 81 MG tablet Take 1 tablet (81 mg total) by mouth daily. Swallow whole. 150 tablet 2  . atorvastatin (LIPITOR) 40 MG tablet Take 1 tablet (40 mg total) by mouth daily. 30 tablet 0     . aspirin EC  81 mg Oral Daily  . atorvastatin   40 mg Oral Daily  . cholecalciferol  2,000 Units Oral Daily  . cholestyramine  4 g Oral BID  . enoxaparin (LOVENOX) injection  40 mg Subcutaneous Q24H  . lidocaine-prilocaine  1 application Topical UD  . multivitamin with minerals  1 tablet Oral Daily    Infusions: . sodium chloride 75 mL/hr at 03/19/20 2320  . cefTRIAXone (ROCEPHIN)  IV 2 g (03/20/20 0810)    Allergies  Allergen Reactions  . Penicillin G Hives and Swelling    Rxn as a child    Social History   Socioeconomic History  . Marital status: Married    Spouse name: Not on file  . Number of children: 2  . Years of education: Not on file  . Highest education level: Not on file  Occupational History  . Occupation: Pharmacist, hospital    Comment: retired  . Occupation: Farmer  Tobacco Use  . Smoking status: Former Smoker    Packs/day: 0.50    Years: 10.00    Pack years: 5.00    Types: Cigarettes    Quit date: 1987    Years since quitting: 34.7  . Smokeless tobacco: Never Used  . Tobacco comment: Quit smoking 1987  Vaping Use  . Vaping Use: Never used  Substance and Sexual Activity  . Alcohol use: Not Currently  Comment: 0-2 mixed drinks a day  . Drug use: No  . Sexual activity: Not Currently    Birth control/protection: Post-menopausal  Other Topics Concern  . Not on file  Social History Narrative  . Not on file   Social Determinants of Health   Financial Resource Strain:   . Difficulty of Paying Living Expenses: Not on file  Food Insecurity:   . Worried About Charity fundraiser in the Last Year: Not on file  . Ran Out of Food in the Last Year: Not on file  Transportation Needs:   . Lack of Transportation (Medical): Not on file  . Lack of Transportation (Non-Medical): Not on file  Physical Activity:   . Days of Exercise per Week: Not on file  . Minutes of Exercise per Session: Not on file  Stress:   . Feeling of Stress : Not on file  Social Connections:   . Frequency of Communication with Friends and  Family: Not on file  . Frequency of Social Gatherings with Friends and Family: Not on file  . Attends Religious Services: Not on file  . Active Member of Clubs or Organizations: Not on file  . Attends Archivist Meetings: Not on file  . Marital Status: Not on file  Intimate Partner Violence:   . Fear of Current or Ex-Partner: Not on file  . Emotionally Abused: Not on file  . Physically Abused: Not on file  . Sexually Abused: Not on file    Family History  Problem Relation Age of Onset  . Breast cancer Paternal Aunt 52  . Diabetes Mother   . Osteoporosis Mother   . Hyperlipidemia Father   . Rheumatic fever Father   . Valvular heart disease Father   . Colon cancer Paternal Grandmother   . Breast cancer Sister 29    PHYSICAL EXAM: Vitals:   03/20/20 0638 03/20/20 0727  BP: 106/67 112/73  Pulse: 69 70  Resp:  16  Temp: 97.8 F (36.6 C) 99.1 F (37.3 C)  SpO2: 100% 99%     Intake/Output Summary (Last 24 hours) at 03/20/2020 1221 Last data filed at 03/20/2020 1010 Gross per 24 hour  Intake 1440 ml  Output --  Net 1440 ml    General:  Well appearing. No respiratory difficulty HEENT: normal Neck: supple. no JVD. Carotids 2+ bilat; no bruits. No lymphadenopathy or thryomegaly appreciated. Cor: PMI nondisplaced. Regular rate & rhythm. No rubs, gallops or murmurs. Lungs: clear Abdomen: soft, nontender, nondistended. No hepatosplenomegaly. No bruits or masses. Good bowel sounds. Extremities: no cyanosis, clubbing, rash, edema Neuro: alert & oriented x 3, cranial nerves grossly intact. moves all 4 extremities w/o difficulty. Affect pleasant.  ECG: Sinus rhythm  Results for orders placed or performed during the hospital encounter of 03/19/20 (from the past 24 hour(s))  Osmolality, urine     Status: None   Collection Time: 03/19/20 12:51 PM  Result Value Ref Range   Osmolality, Ur 383 300 - 900 mOsm/kg  Sodium, urine, random     Status: None   Collection Time:  03/19/20 12:51 PM  Result Value Ref Range   Sodium, Ur 81 mmol/L  Basic metabolic panel     Status: Abnormal   Collection Time: 03/20/20  4:11 AM  Result Value Ref Range   Sodium 136 135 - 145 mmol/L   Potassium 4.0 3.5 - 5.1 mmol/L   Chloride 104 98 - 111 mmol/L   CO2 25 22 - 32 mmol/L  Glucose, Bld 126 (H) 70 - 99 mg/dL   BUN 7 (L) 8 - 23 mg/dL   Creatinine, Ser 0.66 0.44 - 1.00 mg/dL   Calcium 8.3 (L) 8.9 - 10.3 mg/dL   GFR calc non Af Amer >60 >60 mL/min   GFR calc Af Amer >60 >60 mL/min   Anion gap 7 5 - 15  CBC     Status: Abnormal   Collection Time: 03/20/20  4:11 AM  Result Value Ref Range   WBC 4.7 4.0 - 10.5 K/uL   RBC 2.83 (L) 3.87 - 5.11 MIL/uL   Hemoglobin 9.5 (L) 12.0 - 15.0 g/dL   HCT 25.6 (L) 36 - 46 %   MCV 90.5 80.0 - 100.0 fL   MCH 33.6 26.0 - 34.0 pg   MCHC 37.1 (H) 30.0 - 36.0 g/dL   RDW 12.6 11.5 - 15.5 %   Platelets 156 150 - 400 K/uL   nRBC 0.0 0.0 - 0.2 %   DG Chest 2 View  Result Date: 03/19/2020 CLINICAL DATA:  Bacteremia, evaluate for pneumonia. EXAM: CHEST - 2 VIEW COMPARISON:  Prior chest CT 03/13/2020. FINDINGS: A right chest infusion port catheter is present with tip terminating in the region of the superior cavoatrial junction. Heart size within normal limits. Aortic atherosclerosis. No appreciable airspace consolidation. No evidence of pleural effusion or pneumothorax. No acute bony abnormality identified. IMPRESSION: No evidence of active cardiopulmonary disease. Aortic Atherosclerosis (ICD10-I70.0). Electronically Signed   By: Kellie Simmering DO   On: 03/19/2020 12:19     ASSESSMENT AND PLAN: Rule out endocarditis as patient has strep pneumonia will do transesophageal echocardiogram in the morning.  Patient was explained risk and benefits patient agreed to the procedure.  Sullivan Blasing A

## 2020-03-20 NOTE — Treatment Plan (Signed)
Diagnosis: Pneumococcus bacteremia Baseline Creatinine <1   Allergies  Allergen Reactions  . Penicillin G Hives and Swelling    Rxn as a child    OPAT Orders Ceftriaxone  2 grams IV every 24 hours until 04/02/20 ( may need more)   Pt has PORT  Labs weekly while on IV antibiotics: _X_ CBC with differential  _X_ CMP  Fax weekly labs to [phone]  No Clinic Follow Up Appt :   Call [phone] with any questions

## 2020-03-20 NOTE — Plan of Care (Signed)
  Problem: Pain Managment: Goal: General experience of comfort will improve Outcome: Progressing   

## 2020-03-20 NOTE — Progress Notes (Signed)
Hematology/Oncology Progress Note [facility] Telephone:([phone] [phone]  Patient Care Team: Doreen Beam, FNP as PCP - General (Family Medicine) Flinchum, Kelby Aline, FNP (Family Medicine) [name], RN as Oncology Nurse Navigator   Name of the patient: Cassandra [name]  [id number]  October 07, 1955  Date of visit: 03/20/20   INTERVAL HISTORY-  Patient was febrile last night with a temperature 103.  Today afebrile.  She reports feeling well. Was seen by infectious disease physician and was recommended to have TEE.  Patient was seen by cardiology, denies any abdominal pain, chest pain, cough, sore throat    Review of systems- Review of Systems  Constitutional: Negative for appetite change, chills, fatigue and fever.  HENT:   Negative for hearing loss and voice change.   Eyes: Negative for eye problems.  Respiratory: Negative for chest tightness and cough.   Cardiovascular: Negative for chest pain.  Gastrointestinal: Negative for abdominal distention, abdominal pain and blood in stool.  Endocrine: Negative for hot flashes.  Genitourinary: Negative for difficulty urinating and frequency.   Musculoskeletal: Negative for arthralgias.  Skin: Negative for itching and rash.  Neurological: Negative for extremity weakness.  Hematological: Negative for adenopathy.  Psychiatric/Behavioral: Negative for confusion.    Allergies  Allergen Reactions  . Penicillin G Hives and Swelling    Rxn as a child    Patient Active Problem List   Diagnosis Date Noted  . Sepsis ([place]) 03/19/2020  . Bacteremia due to Streptococcus pneumoniae 03/19/2020  . HLD (hyperlipidemia) 03/19/2020  . Hyponatremia 03/19/2020  . UTI (urinary tract infection) 03/19/2020  . Encounter for antineoplastic chemotherapy 03/11/2020  . TIA (transient ischemic attack) 03/11/2020  . Primary pancreatic cancer  ([place]) 02/29/2020  . Family history of cancer 02/29/2020  . Goals of  care, counseling/discussion 02/29/2020  . Obstructive jaundice 02/29/2020  . Plantar fasciitis, right 09/19/2019  . Varicose veins of both lower extremities with inflammation 09/19/2019  . Pure hypercholesterolemia 09/17/2018  . Osteopenia after menopause 09/11/2017  . Postmenopausal 05/12/2017     Past Medical History:  Diagnosis Date  . Cancer [facility])    pancreatic cancer  . Colon polyps   . Osteopenia after menopause 05/2017   femoral neck T score -2.0     Past Surgical History:  Procedure Laterality Date  . COLONOSCOPY  07/2015   WNL  . COLONOSCOPY  2008/2011  . OVARIAN CYST REMOVAL  1992   dermoid-Dr Dalton  . PORTA CATH INSERTION N/A 03/07/2020   Procedure: PORTA CATH INSERTION;  Surgeon: Algernon Huxley, MD;  Location: [facility];  Service: Cardiovascular;  Laterality: N/A;  . TUBAL LIGATION  1993    Social History   Socioeconomic History  . Marital status: Married    Spouse name: Not on file  . Number of children: 2  . Years of education: Not on file  . Highest education level: Not on file  Occupational History  . Occupation: Pharmacist, hospital    Comment: retired  . Occupation: Farmer  Tobacco Use  . Smoking status: Former Smoker    Packs/day: 0.50    Years: 10.00    Pack years: 5.00    Types: Cigarettes    Quit date: 1987    Years since quitting: 34.7  . Smokeless tobacco: Never Used  . Tobacco comment: Quit smoking 1987  Vaping Use  . Vaping Use: Never used  Substance and Sexual Activity  . Alcohol use: Not Currently    Comment: 0-2 mixed  drinks a day  . Drug use: No  . Sexual activity: Not Currently    Birth control/protection: Post-menopausal  Other Topics Concern  . Not on file  Social History Narrative  . Not on file   Social Determinants of Health   Financial Resource Strain:   . Difficulty of Paying Living Expenses: Not on file  Food Insecurity:   . Worried About Charity fundraiser in the Last Year: Not on file  . Ran Out of Food in the  Last Year: Not on file  Transportation Needs:   . Lack of Transportation (Medical): Not on file  . Lack of Transportation (Non-Medical): Not on file  Physical Activity:   . Days of Exercise per Week: Not on file  . Minutes of Exercise per Session: Not on file  Stress:   . Feeling of Stress : Not on file  Social Connections:   . Frequency of Communication with Friends and Family: Not on file  . Frequency of Social Gatherings with Friends and Family: Not on file  . Attends Religious Services: Not on file  . Active Member of Clubs or Organizations: Not on file  . Attends Archivist Meetings: Not on file  . Marital Status: Not on file  Intimate Partner Violence:   . Fear of Current or Ex-Partner: Not on file  . Emotionally Abused: Not on file  . Physically Abused: Not on file  . Sexually Abused: Not on file     Family History  Problem Relation Age of Onset  . Breast cancer Paternal Aunt 41  . Diabetes Mother   . Osteoporosis Mother   . Hyperlipidemia Father   . Rheumatic fever Father   . Valvular heart disease Father   . Colon cancer Paternal Grandmother   . Breast cancer Sister 66     Current Facility-Administered Medications:  .  0.9 %  sodium chloride infusion, , Intravenous, Continuous, Ivor Costa, MD, Last Rate: 75 mL/hr at 03/19/20 2320, New Bag at 03/19/20 2320 .  0.9 %  sodium chloride infusion, , Intravenous, Continuous, Neoma Laming A, MD .  acetaminophen (TYLENOL) tablet 650 mg, 650 mg, Oral, Q6H PRN, Ivor Costa, MD, 650 mg at 03/19/20 2318 .  aspirin EC tablet 81 mg, 81 mg, Oral, Daily, Wyvonnia Dusky, MD, 81 mg at 03/20/20 0804 .  atorvastatin (LIPITOR) tablet 40 mg, 40 mg, Oral, Daily, Wyvonnia Dusky, MD, 40 mg at 03/20/20 0805 .  cefTRIAXone (ROCEPHIN) 2 g in sodium chloride 0.9 % 100 mL IVPB, 2 g, Intravenous, Q24H, Ivor Costa, MD, Last Rate: 200 mL/hr at 03/20/20 0810, 2 g at 03/20/20 0810 .  cholecalciferol (VITAMIN D3) tablet 2,000 Units,  2,000 Units, Oral, Daily, Ivor Costa, MD, 2,000 Units at 03/20/20 0805 .  cholestyramine (QUESTRAN) packet 4 g, 4 g, Oral, BID, Ivor Costa, MD .  enoxaparin (LOVENOX) injection 40 mg, 40 mg, Subcutaneous, Q24H, Ivor Costa, MD, 40 mg at 03/19/20 1318 .  lidocaine-prilocaine (EMLA) cream 1 application, 1 application, Topical, UD, Ivor Costa, MD .  loperamide (IMODIUM) capsule 2 mg, 2 mg, Oral, PRN, Ivor Costa, MD, 2 mg at 03/19/20 1733 .  multivitamin with minerals tablet 1 tablet, 1 tablet, Oral, Daily, Ivor Costa, MD, 1 tablet at 03/20/20 0805 .  ondansetron (ZOFRAN) injection 4 mg, 4 mg, Intravenous, Q8H PRN, Ivor Costa, MD .  simethicone (MYLICON) chewable tablet 80 mg, 80 mg, Oral, Q8H PRN, Ivor Costa, MD   Physical exam:  Vitals:  03/20/20 0301 03/20/20 0638 03/20/20 0727 03/20/20 1610  BP: 114/77 106/67 112/73 117/76  Pulse: 82 69 70 72  Resp: 18  16 17   Temp: 98.7 F (37.1 C) 97.8 F (36.6 C) 99.1 F (37.3 C) 99 F (37.2 C)  TempSrc: Oral Oral Oral Oral  SpO2: 100% 100% 99% 100%  Weight:      Height:       Physical Exam Constitutional:      General: She is not in acute distress.    Appearance: She is not diaphoretic.  HENT:     Head: Normocephalic and atraumatic.     Nose: Nose normal.     Mouth/Throat:     Pharynx: No oropharyngeal exudate.  Eyes:     General: No scleral icterus.    Pupils: Pupils are equal, round, and reactive to light.  Cardiovascular:     Rate and Rhythm: Normal rate and regular rhythm.     Heart sounds: No murmur heard.   Pulmonary:     Effort: Pulmonary effort is normal. No respiratory distress.     Breath sounds: No rales.  Chest:     Chest wall: No tenderness.  Abdominal:     General: There is no distension.     Palpations: Abdomen is soft.     Tenderness: There is no abdominal tenderness.  Musculoskeletal:        General: Normal range of motion.     Cervical back: Normal range of motion and neck supple.  Skin:    General: Skin is  warm and dry.     Findings: No erythema.  Neurological:     Mental Status: She is alert and oriented to person, place, and time.     Cranial Nerves: No cranial nerve deficit.     Motor: No abnormal muscle tone.     Coordination: Coordination normal.  Psychiatric:        Mood and Affect: Affect normal.        CMP Latest Ref Rng & Units 03/20/2020  Glucose 70 - 99 mg/dL 126(H)  BUN 8 - 23 mg/dL 7(L)  Creatinine 0.44 - 1.00 mg/dL 0.66  Sodium 135 - 145 mmol/L 136  Potassium 3.5 - 5.1 mmol/L 4.0  Chloride 98 - 111 mmol/L 104  CO2 22 - 32 mmol/L 25  Calcium 8.9 - 10.3 mg/dL 8.3(L)  Total Protein 6.5 - 8.1 g/dL -  Total Bilirubin 0.3 - 1.2 mg/dL -  Alkaline Phos 38 - 126 U/L -  AST 15 - 41 U/L -  ALT 0 - 44 U/L -   CBC Latest Ref Rng & Units 03/20/2020  WBC 4.0 - 10.5 K/uL 4.7  Hemoglobin 12.0 - 15.0 g/dL 9.5(L)  Hematocrit 36 - 46 % 25.6(L)  Platelets 150 - 400 K/uL 156    RADIOGRAPHIC STUDIES: I have personally reviewed the radiological images as listed and agreed with the findings in the report. CT ANGIO HEAD W OR WO CONTRAST  Result Date: 03/12/2020 CLINICAL DATA:  Slurred speech.  Pancreatic cancer patient. EXAM: CT ANGIOGRAPHY HEAD AND NECK TECHNIQUE: Multidetector CT imaging of the head and neck was performed using the standard protocol during bolus administration of intravenous contrast. Multiplanar CT image reconstructions and MIPs were obtained to evaluate the vascular anatomy. Carotid stenosis measurements (when applicable) are obtained utilizing NASCET criteria, using the distal internal carotid diameter as the denominator. CONTRAST:  51mL OMNIPAQUE IOHEXOL 350 MG/ML SOLN COMPARISON:  Head CT yesterday. FINDINGS: CT HEAD FINDINGS Brain: No evidence  of acute infarction. No accelerated brain atrophy. No mass, hemorrhage, hydrocephalus or extra-axial collection. Vascular: There is atherosclerotic calcification of the major vessels at the base of the brain. Skull: Negative  Sinuses: Clear Orbits: Normal Review of the MIP images confirms the above findings CTA NECK FINDINGS Aortic arch: Aortic atherosclerosis. Branching pattern is normal without origin stenosis. Right carotid system: Common carotid artery widely patent to the bifurcation. Calcified plaque at the carotid bifurcation but no stenosis. ICA bulb widely patent. Cervical ICA is normal. Left carotid system: Common carotid artery widely patent to the bifurcation. Carotid bifurcation is normal without plaque or stenosis. Cervical ICA is normal. Vertebral arteries: Both vertebral arteries widely patent at their origins and through the cervical region to the foramen magnum. Skeleton: Ordinary cervical spondylosis. Other neck: No mass or lymphadenopathy. Upper chest: Normal Review of the MIP images confirms the above findings CTA HEAD FINDINGS Anterior circulation: Both internal carotid arteries widely patent through the skull base and siphon regions. Ordinary siphon atherosclerotic calcification but without stenosis greater than 30%. The anterior and middle cerebral vessels are normal without proximal stenosis, aneurysm or vascular malformation. No large or medium vessel occlusion. Posterior circulation: Both vertebral arteries widely patent to the basilar. No basilar stenosis. Posterior circulation branch vessels are normal. Venous sinuses: Patent and normal. Anatomic variants: None significant. Review of the MIP images confirms the above findings IMPRESSION: 1. Mild atherosclerotic disease at the right carotid bifurcation but without stenosis. Left carotid bifurcation normal. 2. No intracranial large or medium vessel occlusion or correctable proximal stenosis. 3. Aortic atherosclerosis. Aortic Atherosclerosis (ICD10-I70.0). Electronically Signed   By: Nelson Chimes M.D.   On: 03/12/2020 10:46   DG Chest 2 View  Result Date: 03/19/2020 CLINICAL DATA:  Bacteremia, evaluate for pneumonia. EXAM: CHEST - 2 VIEW COMPARISON:  Prior  chest CT 03/13/2020. FINDINGS: A right chest infusion port catheter is present with tip terminating in the region of the superior cavoatrial junction. Heart size within normal limits. Aortic atherosclerosis. No appreciable airspace consolidation. No evidence of pleural effusion or pneumothorax. No acute bony abnormality identified. IMPRESSION: No evidence of active cardiopulmonary disease. Aortic Atherosclerosis (ICD10-I70.0). Electronically Signed   By: Kellie Simmering DO   On: 03/19/2020 12:19   CT HEAD WO CONTRAST  Result Date: 03/11/2020 CLINICAL DATA:  Slurred speech EXAM: CT HEAD WITHOUT CONTRAST TECHNIQUE: Contiguous axial images were obtained from the base of the skull through the vertex without intravenous contrast. COMPARISON:  None. FINDINGS: Brain: No evidence of acute territorial infarction, hemorrhage, hydrocephalus,extra-axial collection or mass lesion/mass effect. Normal gray-white differentiation. Ventricles are normal in size and contour. Vascular: No hyperdense vessel or unexpected calcification. Skull: The skull is intact. No fracture or focal lesion identified. Sinuses/Orbits: The visualized paranasal sinuses and mastoid air cells are clear. The orbits and globes intact. Other: None IMPRESSION: No acute intracranial abnormality. Electronically Signed   By: Prudencio Pair M.D.   On: 03/11/2020 16:48   CT ANGIO NECK W OR WO CONTRAST  Result Date: 03/12/2020 CLINICAL DATA:  Slurred speech.  Pancreatic cancer patient. EXAM: CT ANGIOGRAPHY HEAD AND NECK TECHNIQUE: Multidetector CT imaging of the head and neck was performed using the standard protocol during bolus administration of intravenous contrast. Multiplanar CT image reconstructions and MIPs were obtained to evaluate the vascular anatomy. Carotid stenosis measurements (when applicable) are obtained utilizing NASCET criteria, using the distal internal carotid diameter as the denominator. CONTRAST:  76mL OMNIPAQUE IOHEXOL 350 MG/ML SOLN  COMPARISON:  Head CT yesterday. FINDINGS: CT  HEAD FINDINGS Brain: No evidence of acute infarction. No accelerated brain atrophy. No mass, hemorrhage, hydrocephalus or extra-axial collection. Vascular: There is atherosclerotic calcification of the major vessels at the base of the brain. Skull: Negative Sinuses: Clear Orbits: Normal Review of the MIP images confirms the above findings CTA NECK FINDINGS Aortic arch: Aortic atherosclerosis. Branching pattern is normal without origin stenosis. Right carotid system: Common carotid artery widely patent to the bifurcation. Calcified plaque at the carotid bifurcation but no stenosis. ICA bulb widely patent. Cervical ICA is normal. Left carotid system: Common carotid artery widely patent to the bifurcation. Carotid bifurcation is normal without plaque or stenosis. Cervical ICA is normal. Vertebral arteries: Both vertebral arteries widely patent at their origins and through the cervical region to the foramen magnum. Skeleton: Ordinary cervical spondylosis. Other neck: No mass or lymphadenopathy. Upper chest: Normal Review of the MIP images confirms the above findings CTA HEAD FINDINGS Anterior circulation: Both internal carotid arteries widely patent through the skull base and siphon regions. Ordinary siphon atherosclerotic calcification but without stenosis greater than 30%. The anterior and middle cerebral vessels are normal without proximal stenosis, aneurysm or vascular malformation. No large or medium vessel occlusion. Posterior circulation: Both vertebral arteries widely patent to the basilar. No basilar stenosis. Posterior circulation branch vessels are normal. Venous sinuses: Patent and normal. Anatomic variants: None significant. Review of the MIP images confirms the above findings IMPRESSION: 1. Mild atherosclerotic disease at the right carotid bifurcation but without stenosis. Left carotid bifurcation normal. 2. No intracranial large or medium vessel occlusion or  correctable proximal stenosis. 3. Aortic atherosclerosis. Aortic Atherosclerosis (ICD10-I70.0). Electronically Signed   By: Nelson Chimes M.D.   On: 03/12/2020 10:46   CT CHEST W CONTRAST  Result Date: 03/13/2020 CLINICAL DATA:  64 year old female with history of pancreatic cancer. EXAM: CT CHEST WITH CONTRAST TECHNIQUE: Multidetector CT imaging of the chest was performed during intravenous contrast administration. CONTRAST:  28mL OMNIPAQUE IOHEXOL 300 MG/ML  SOLN COMPARISON:  No priors. FINDINGS: Cardiovascular: Heart size is normal. There is no significant pericardial fluid, thickening or pericardial calcification. 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 No definite coronary artery calcifications. Right internal jugular single-lumen porta cath with tip terminating in the superior aspect of the right atrium. Mediastinum/Nodes: No pathologically enlarged mediastinal or hilar lymph nodes. Esophagus is unremarkable in appearance. No axillary lymphadenopathy. Lungs/Pleura: No suspicious appearing pulmonary nodules or masses are noted. No acute consolidative airspace disease. No pleural effusions. Upper Abdomen: Extensive pneumobilia. Musculoskeletal: There are no aggressive appearing lytic or blastic lesions noted in the visualized portions of the skeleton. IMPRESSION: 1. No findings to suggest metastatic disease to the thorax. 2. 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 Recommend annual imaging followup by CTA or MRA. This recommendation follows 2010 ACCF/AHA/AATS/ACR/ASA/SCA/SCAI/SIR/STS/SVM Guidelines for the Diagnosis and Management of Patients with Thoracic Aortic Disease. Circulation. 2010; 121: J188-C166. Aortic aneurysm NOS (ICD10-I71.9). Aortic Atherosclerosis (ICD10-I70.0). Electronically Signed   By: Vinnie Langton M.D.   On: 03/13/2020 11:59   MR BRAIN WO CONTRAST  Result Date: 03/12/2020 CLINICAL  DATA:  Transit ischemic attack.  Slurred speech. EXAM: MRI HEAD WITHOUT CONTRAST TECHNIQUE: Multiplanar, multiecho pulse sequences of the brain and surrounding structures were obtained without intravenous contrast. COMPARISON:  03/11/2020 head CT.  03/12/2020 CTA head and neck. FINDINGS: Brain: No acute infarct or intracranial hemorrhage. Cerebral volume is within normal limits. Minimal chronic microvascular ischemic changes. No midline shift, ventriculomegaly  or extra-axial fluid collection. No mass lesion. Vascular: Please see same day CTA. Skull and upper cervical spine: Normal marrow signal. Sinuses/Orbits: Normal orbits. Clear paranasal sinuses. No mastoid effusion. Other: None. IMPRESSION: No acute intracranial process. Minimal chronic microvascular ischemic changes. Electronically Signed   By: Primitivo Gauze M.D.   On: 03/12/2020 11:49   US Carotid Bilateral (at [facility] and [facility] only)  Result Date: 03/12/2020 CLINICAL DATA:  64 year old female with a history of TIA EXAM: BILATERAL CAROTID DUPLEX ULTRASOUND TECHNIQUE: Pearline Cables scale imaging, color Doppler and duplex ultrasound were performed of bilateral carotid and vertebral arteries in the neck. COMPARISON:  None. FINDINGS: Criteria: Quantification of carotid stenosis is based on velocity parameters that correlate the residual internal carotid diameter with NASCET-based stenosis levels, using the diameter of the distal internal carotid lumen as the denominator for stenosis measurement. The following velocity measurements were obtained: RIGHT ICA:  Systolic 093 cm/sec, Diastolic 52 cm/sec CCA:  98 cm/sec SYSTOLIC ICA/CCA RATIO:  1.6 ECA:  170 cm/sec LEFT ICA:  Systolic 89 cm/sec, Diastolic 14 cm/sec CCA:  95 cm/sec SYSTOLIC ICA/CCA RATIO:  0.9 ECA:  119 cm/sec Right Brachial SBP: Not acquired Left Brachial SBP: Not acquired RIGHT CAROTID ARTERY: No significant calcifications of the right common carotid artery. Intermediate waveform maintained. Heterogeneous  and partially calcified plaque at the right carotid bifurcation. No significant lumen shadowing. Low resistance waveform of the right ICA. No significant tortuosity. RIGHT VERTEBRAL ARTERY: Antegrade flow with low resistance waveform. LEFT CAROTID ARTERY: No significant calcifications of the left common carotid artery. Intermediate waveform maintained. Heterogeneous and partially calcified plaque at the left carotid bifurcation without significant lumen shadowing. Low resistance waveform of the left ICA. No significant tortuosity. LEFT VERTEBRAL ARTERY:  Antegrade flow with low resistance waveform. IMPRESSION: Right: Heterogeneous and partially calcified plaque at the right carotid bifurcation, with discordant results regarding degree of stenosis by established duplex criteria. Peak velocity suggests 50%-69% stenosis, with the ICA/ CCA ratio suggesting a lesser degree of stenosis. If establishing a more accurate degree of stenosis is required, cerebral angiogram should be considered, or as a second best test, CTA. Left: Color duplex indicates minimal heterogeneous and calcified plaque, with no hemodynamically significant stenosis by duplex criteria in the extracranial cerebrovascular circulation. Signed, Dulcy Fanny. Dellia Nims, RPVI [facility] Radiology Electronically Signed   By: Corrie Mckusick D.O.   On: 03/12/2020 14:02   PERIPHERAL VASCULAR CATHETERIZATION  Result Date: 03/07/2020 See op note  ECHOCARDIOGRAM COMPLETE  Result Date: 03/12/2020    ECHOCARDIOGRAM REPORT   Patient Name:   WRIGLEY WINBORNE Date of Exam: 03/12/2020 Medical Rec #:  [id number]   Height:       67.0 in Accession #:    [id number]  Weight:       145.3 lb Date of Birth:  May 12, 1956   BSA:          1.765 m Patient Age:    46 years    BP:           125/74 mmHg Patient Gender: F           HR:           63 bpm. Exam Location:  [facility] Procedure: 2D Echo, Cardiac Doppler and Color Doppler Indications:      TIA 435.9  History:         Patient has no prior history of Echocardiogram examinations. No  cardiac hsitory listed.  Sonographer:     Sherrie Sport RDCS (AE) Referring Phys:  [id number] Sidney Ace Diagnosing Phys: Kate Sable MD IMPRESSIONS  1. Left ventricular ejection fraction, by estimation, is 65 to 70%. The left ventricle has normal function. The left ventricle has no regional wall motion abnormalities. Left ventricular diastolic parameters were normal.  2. Right ventricular systolic function is normal. The right ventricular size is normal. There is normal pulmonary artery systolic pressure.  3. The mitral valve is normal in structure. No evidence of mitral valve regurgitation. No evidence of mitral stenosis.  4. The aortic valve is normal in structure. Aortic valve regurgitation is not visualized. No aortic stenosis is present.  5. The inferior vena cava is normal in size with greater than 50% respiratory variability, suggesting right atrial pressure of 3 mmHg. FINDINGS  Left Ventricle: Left ventricular ejection fraction, by estimation, is 65 to 70%. The left ventricle has normal function. The left ventricle has no regional wall motion abnormalities. The left ventricular internal cavity size was normal in size. There is  no left ventricular hypertrophy. Left ventricular diastolic parameters were normal. Right Ventricle: The right ventricular size is normal. No increase in right ventricular wall thickness. Right ventricular systolic function is normal. There is normal pulmonary artery systolic pressure. The tricuspid regurgitant velocity is 2.57 m/s, and  with an assumed right atrial pressure of 3 mmHg, the estimated right ventricular systolic pressure is 35.5 mmHg. Left Atrium: Left atrial size was normal in size. Right Atrium: Right atrial size was normal in size. Pericardium: There is no evidence of pericardial effusion. Mitral Valve: The mitral valve is normal in structure. No  evidence of mitral valve regurgitation. No evidence of mitral valve stenosis. Tricuspid Valve: The tricuspid valve is normal in structure. Tricuspid valve regurgitation is not demonstrated. No evidence of tricuspid stenosis. Aortic Valve: The aortic valve is normal in structure. Aortic valve regurgitation is not visualized. No aortic stenosis is present. Aortic valve mean gradient measures 4.5 mmHg. Aortic valve peak gradient measures 7.3 mmHg. Aortic valve area, by VTI measures 3.23 cm. Pulmonic Valve: The pulmonic valve was normal in structure. Pulmonic valve regurgitation is not visualized. No evidence of pulmonic stenosis. Aorta: The aortic root is normal in size and structure. Venous: The inferior vena cava is normal in size with greater than 50% respiratory variability, suggesting right atrial pressure of 3 mmHg. IAS/Shunts: No atrial level shunt detected by color flow Doppler.  LEFT VENTRICLE PLAX 2D LVIDd:         4.00 cm  Diastology LVIDs:         2.28 cm  LV e' medial:    7.72 cm/s LV PW:         1.50 cm  LV E/e' medial:  10.8 LV IVS:        0.90 cm  LV e' lateral:   11.50 cm/s LVOT diam:     2.00 cm  LV E/e' lateral: 7.3 LV SV:         96 LV SV Index:   54 LVOT Area:     3.14 cm  RIGHT VENTRICLE RV Basal diam:  3.77 cm RV S prime:     19.00 cm/s TAPSE (M-mode): 3.7 cm LEFT ATRIUM             Index       RIGHT ATRIUM           Index LA diam:  2.30 cm 1.30 cm/m  RA Area:     16.70 cm LA Vol (A2C):   45.2 ml 25.60 ml/m RA Volume:   45.40 ml  25.72 ml/m LA Vol (A4C):   35.1 ml 19.88 ml/m LA Biplane Vol: 39.1 ml 22.15 ml/m  AORTIC VALVE                   PULMONIC VALVE AV Area (Vmax):    3.22 cm    PV Vmax:        0.79 m/s AV Area (Vmean):   3.07 cm    PV Peak grad:   2.5 mmHg AV Area (VTI):     3.23 cm    RVOT Peak grad: 4 mmHg AV Vmax:           135.50 cm/s AV Vmean:          99.250 cm/s AV VTI:            0.297 m AV Peak Grad:      7.3 mmHg AV Mean Grad:      4.5 mmHg LVOT Vmax:          139.00 cm/s LVOT Vmean:        97.100 cm/s LVOT VTI:          0.305 m LVOT/AV VTI ratio: 1.03  AORTA Ao Root diam: 3.50 cm MITRAL VALVE               TRICUSPID VALVE MV Area (PHT): 2.39 cm    TR Peak grad:   26.4 mmHg MV Decel Time: 317 msec    TR Vmax:        257.00 cm/s MV E velocity: 83.40 cm/s MV A velocity: 72.30 cm/s  SHUNTS MV E/A ratio:  1.15        Systemic VTI:  0.30 m                            Systemic Diam: 2.00 cm Kate Sable MD Electronically signed by Kate Sable MD Signature Date/Time: 03/12/2020/4:01:51 PM    Final     Assessment and plan-  Patient is a 64 y.o. 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  Patient is on chemotherapy and is immunocompromise.  Not neutropenic. ID recommendation was reviewed. Recommend TEE to rule out vegetation/ASD/patent foreman ovale. Continue IV ceftriaxone. If no endocarditis, may need total of 10 to 14 days of ceftriaxone.  #Stage IV pancreatic cancer status post 1 cycle of FOLFIRINOX. Hold off chemotherapy due to acute issue.  I discussed with the patient that next chemotherapy will need to be delayed until she finishes the course of recommended IV antibiotics.  #Obstructive jaundice status post.  Stent, bilirubin stable #Anemia, likely due to chemotherapy, as well as probably dilutional effect.  Continue to monitor.   Thank you for allowing me to participate in the care of this patient.   [name], MD, PhD Hematology Oncology [facility] Pager- [phone] 03/20/2020

## 2020-03-21 ENCOUNTER — Encounter
Admission: EM | Disposition: A | Discharge status: Home Health | Payer: Self-pay | Source: Ambulatory Visit | Attending: Internal Medicine

## 2020-03-21 ENCOUNTER — Inpatient Hospital Stay: Payer: BC Managed Care – PPO

## 2020-03-21 ENCOUNTER — Inpatient Hospital Stay: Payer: BC Managed Care – PPO | Admitting: Oncology

## 2020-03-21 ENCOUNTER — Inpatient Hospital Stay
Admit: 2020-03-21 | Discharge: 2020-03-21 | Disposition: A | Discharge status: Home / Self Care | Payer: BC Managed Care – PPO | Attending: Cardiovascular Disease | Admitting: Cardiovascular Disease

## 2020-03-21 HISTORY — PX: TEE WITHOUT CARDIOVERSION: SHX5443

## 2020-03-21 LAB — CBC
HCT: 24.9 % — ABNORMAL LOW (ref 36.0–46.0)
Hemoglobin: 9 g/dL — ABNORMAL LOW (ref 12.0–15.0)
MCH: 33 pg (ref 26.0–34.0)
MCHC: 36.1 g/dL — ABNORMAL HIGH (ref 30.0–36.0)
MCV: 91.2 fL (ref 80.0–100.0)
Platelets: 174 10*3/uL (ref 150–400)
RBC: 2.73 MIL/uL — ABNORMAL LOW (ref 3.87–5.11)
RDW: 12.9 % (ref 11.5–15.5)
WBC: 3.8 10*3/uL — ABNORMAL LOW (ref 4.0–10.5)
nRBC: 0 % (ref 0.0–0.2)

## 2020-03-21 LAB — CULTURE, BLOOD (ROUTINE X 2)
Special Requests: ADEQUATE
Special Requests: ADEQUATE

## 2020-03-21 LAB — COMPREHENSIVE METABOLIC PANEL
ALT: 74 U/L — ABNORMAL HIGH (ref 0–44)
AST: 52 U/L — ABNORMAL HIGH (ref 15–41)
Albumin: 2.5 g/dL — ABNORMAL LOW (ref 3.5–5.0)
Alkaline Phosphatase: 112 U/L (ref 38–126)
Anion gap: 7 (ref 5–15)
BUN: 6 mg/dL — ABNORMAL LOW (ref 8–23)
CO2: 25 mmol/L (ref 22–32)
Calcium: 8.2 mg/dL — ABNORMAL LOW (ref 8.9–10.3)
Chloride: 108 mmol/L (ref 98–111)
Creatinine, Ser: 0.52 mg/dL (ref 0.44–1.00)
GFR calc Af Amer: >60 mL/min (ref >60)
GFR calc non Af Amer: >60 mL/min (ref >60)
Glucose, Bld: 114 mg/dL — ABNORMAL HIGH (ref 70–99)
Potassium: 3.7 mmol/L (ref 3.5–5.1)
Sodium: 140 mmol/L (ref 135–145)
Total Bilirubin: 1 mg/dL (ref 0.3–1.2)
Total Protein: 5.3 g/dL — ABNORMAL LOW (ref 6.5–8.1)

## 2020-03-21 LAB — SLIDE CONSULT, PATHOLOGY ARMC

## 2020-03-21 SURGERY — ECHOCARDIOGRAM, TRANSESOPHAGEAL
Anesthesia: Moderate Sedation

## 2020-03-21 SURGERY — ECHOCARDIOGRAM, TRANSESOPHAGEAL
Anesthesia: Moderate Sedation | Laterality: Right

## 2020-03-21 MED ORDER — MIDAZOLAM HCL 5 MG/5ML IJ SOLN
INTRAMUSCULAR | Status: AC
  Filled 2020-03-21: qty 5

## 2020-03-21 MED ORDER — FENTANYL CITRATE (PF) 100 MCG/2ML IJ SOLN
INTRAMUSCULAR | Status: AC | PRN
  Administered 2020-03-21: 50 ug via INTRAVENOUS
  Administered 2020-03-21: 25 ug via INTRAVENOUS

## 2020-03-21 MED ORDER — CEFTRIAXONE IV (FOR PTA / DISCHARGE USE ONLY): 2.0000 g | INTRAVENOUS | 0 refills | Status: AC

## 2020-03-21 MED ORDER — LIDOCAINE VISCOUS HCL 2 % MT SOLN
OROMUCOSAL | Status: AC
  Filled 2020-03-21: qty 15

## 2020-03-21 MED ORDER — BUTAMBEN-TETRACAINE-BENZOCAINE 2-2-14 % EX AERO
INHALATION_SPRAY | CUTANEOUS | Status: AC
  Filled 2020-03-21: qty 5

## 2020-03-21 MED ORDER — MIDAZOLAM HCL 2 MG/2ML IJ SOLN
INTRAMUSCULAR | Status: AC | PRN
  Administered 2020-03-21 (×2): 1 mg via INTRAVENOUS

## 2020-03-21 MED ORDER — FENTANYL CITRATE (PF) 100 MCG/2ML IJ SOLN
INTRAMUSCULAR | Status: AC
  Filled 2020-03-21: qty 2

## 2020-03-21 MED ORDER — SODIUM CHLORIDE FLUSH 0.9 % IV SOLN
INTRAVENOUS | Status: AC
  Filled 2020-03-21: qty 10

## 2020-03-21 NOTE — Discharge Summary (Signed)
Physician Discharge Summary  [name] KJZ:[id number] DOB: May 23, 1956 DOA: 03/19/2020  PCP: Doreen Beam, FNP  Admit date: 03/19/2020 Discharge date: 03/21/2020  Admitted From: home Disposition:  Home w/ home health   Recommendations for Outpatient Follow-up:  1. Follow up with PCP in 1-2 weeks 2. F/u oncology in 1-2 weeks  Home Health: yes Equipment/Devices:  Discharge Condition: stable CODE STATUS: full  Diet recommendation: Heart Healthy  Brief/Interim Summary: HPI was taken from Dr. Blaine Hamper: TANAJA [name] is a 64 y.o. female with medical history significant of stage IV pancreatic cancer on chemotherapy, TIA, HLD, presents with fatigue and fever.  Patient states she has been having generalized weakness and fatigue in the past several days, which has been progressively worsening.  Patient has poor appetite and decreased oral intake.  Patient also has fever and chills. Her body temperature was 101.3 in ED todasy.  Patient has nausea, no vomiting, abdominal pain.  Patient had one loose stool bowel movement earlier today.  Currently no diarrhea.  Denies chest pain, shortness breath, cough.  Patient has mild burning on urination, denies dysuria or hematuria. Pt had blood culture done in cancer center yesterday, and was found to have positive blood culture with streptococcus pneumonia.   ED Course: pt was found to have WBC 8.2, negative COVID-19 PCR, lactic acid 1.3, sodium 129, renal function okay, Urinalysis on 03/18/2020 wih hazy appearance, small amount of leukocyte, no bacteria and WBC 21-50. Temperature 101.3, tachycardia with heart rate 114, blood pressure 120/84, RR 18, oxygen saturation 98% on room air.  Pending chest x-ray.  Patient is admitted to [facility] bed as inpatient.  Dr. Delaine Lame of infectious disease and Dr. Tasia Catchings of oncology are consulted.  Hospital Course from Dr. Lenise Herald 9/23-9/24/21: Pt presented w/ sepsis secondary to bacteremia. The bacteremia was secondary to  strept pneumoniae and was treated w/ IV rocephin. A TEE was done as well and no vegetations were found, no PFO or ASD. Pt was d/c home w/ IV rocephin until 04/02/20 as per ID. Repeat blood cxs were NGTD. For more information, please see other progress notes.   Discharge Diagnoses:  Principal Problem:   Bacteremia due to Streptococcus pneumoniae Active Problems:   Primary pancreatic cancer  (HCC)   TIA (transient ischemic attack)   Sepsis ([place])   HLD (hyperlipidemia)   Hyponatremia   UTI (urinary tract infection)  Sepsis: secondary bacteremia due to streptococcus pneumoniae. Meets criteria for sepsis with tachycardia, fever. Blood cxs growing strep pneumoniae, sens pending. Continue on IV rocephin until 04/02/20. Urine cx is pending. Continue on IVFs  Bacteremia: secondary strep pneumoniae, sens pending. Continue on IV rocephin. Urine cx is pending. Continue on IVFs. ID following and recs apprec. TEE was neg for vegetations, PFO or ASD as per cardio   Possible UTI: continue on IV rocephin. Urine cx is pending   Pancreatic cancer: stage IV, on chemotherapy. Onco following and recs apprec   Hx of TIA: will continue on aspirin, statin   HLD: will start statin   Hyponatremia: likely due to poor oral intake and dehydration. Resolved  Discharge Instructions  Discharge Instructions    [facility] pharmacist to adjust dose for Vancomycin, Aminoglycosides and other anti-infective therapies as requested by physician.   Complete by: As directed    [facility] to provide Cath Flo 59m   Complete by: As directed    Administer for PICC line occlusion and as ordered by physician for other access device issues.  Anaphylaxis Kit: Provided to treat any anaphylactic reaction to the medication being provided to the patient if First Dose or when requested by physician   Complete by: As directed    Epinephrine 84m/ml vial / amp: Administer 0.354m(0.12m71msubcutaneously once for  moderate to severe anaphylaxis, nurse to call physician and pharmacy when reaction occurs and call 911 if needed for immediate care   Diphenhydramine 75m85m IV vial: Administer 25-75mg7mIM PRN for first dose reaction, rash, itching, mild reaction, nurse to call physician and pharmacy when reaction occurs   Sodium Chloride 0.9% NS 500ml 41mAdminister if needed for hypovolemic blood pressure drop or as ordered by physician after call to physician with anaphylactic reaction   Change dressing on IV access line weekly and PRN   Complete by: As directed    Diet - low sodium heart healthy   Complete by: As directed    Discharge instructions   Complete by: As directed    F/u PCP in 1-2 weeks. F/u oncology in 1-2 weeks   Flush IV access with Sodium Chloride 0.9% and Heparin 10 units/ml or 100 units/ml   Complete by: As directed    Home infusion instructions - [facility]   Complete by: As directed    Instructions: Flush IV access with Sodium Chloride 0.9% and Heparin 10units/ml or 100units/ml   Change dressing on IV access line: Weekly and PRN   Instructions Cath Flo 2mg: A59mnister for PICC Line occlusion and as ordered by physician for other access device   [facility] pharmacist to adjust dose for: Vancomycin, Aminoglycosides and other anti-infective therapies as requested by physician   Increase activity slowly   Complete by: As directed    Method of administration may be changed at the discretion of home infusion pharmacist based upon assessment of the patient and/or caregiver's ability to self-administer the medication ordered   Complete by: As directed      Allergies as of 03/21/2020      Reactions   Penicillin G Hives, Swelling   Rxn as a child      Medication List    TAKE these medications   aspirin EC 81 MG tablet Take 1 tablet (81 mg total) by mouth daily. Swallow whole.   atorvastatin 40 MG tablet Commonly known as: LIPITOR Take 1 tablet (40 mg total)  by mouth daily.   cefTRIAXone  IVPB Commonly known as: ROCEPHIN Inject 2 g into the vein daily for 11 days. Indication: pneumococcal bacteremia First Dose: Yes Last Day of Therapy:  04/02/2020 Labs - Once weekly:  CBC/D and CMP Method of administration: IV Push Method of administration may be changed at the discretion of home infusion pharmacist based upon assessment of the patient and/or caregiver's ability to self-administer the medication ordered. Start taking on: March 22, 2020   Cholecalciferol 25 MCG (1000 UT) tablet Take 2,000 Units by mouth daily.   cholestyramine 4 GM/DOSE powder Commonly known as: QUESTRAN Take 4 g by mouth in the morning and at bedtime.   lidocaine-prilocaine cream Commonly known as: EMLA Apply to affected area once What changed:   how much to take  how to take this  when to take this  additional instructions   loperamide 2 MG tablet Commonly known as: Imodium A-D Take 2 at onset of diarrhea, then 1 every 2hrs until 12hr without a BM. May take 2 tab every 4hrs at bedtime. If diarrhea recurs repeat.   MULTIVITAMIN ADULT PO Take 1 tablet by  mouth daily.   ondansetron 8 MG tablet Commonly known as: Zofran Take 1 tablet (8 mg total) by mouth 2 (two) times daily as needed. Start on day 3 after chemotherapy.   prochlorperazine 10 MG tablet Commonly known as: COMPAZINE Take 1 tablet (10 mg total) by mouth every 6 (six) hours as needed (Nausea or vomiting).   simethicone 80 MG chewable tablet Commonly known as: Gas-X Chew 1 tablet (80 mg total) by mouth every 8 (eight) hours as needed for flatulence.            Discharge Care Instructions  (From admission, onward)         Start     Ordered   03/21/20 0000  Change dressing on IV access line weekly and PRN  (Home infusion instructions - [facility] )        03/21/20 1218          Allergies  Allergen Reactions  . Penicillin G Hives and Swelling    Rxn as a child     Consultations: ID Onco   Procedures/Studies: CT ANGIO HEAD W OR WO CONTRAST  Result Date: 03/12/2020 CLINICAL DATA:  Slurred speech.  Pancreatic cancer patient. EXAM: CT ANGIOGRAPHY HEAD AND NECK TECHNIQUE: Multidetector CT imaging of the head and neck was performed using the standard protocol during bolus administration of intravenous contrast. Multiplanar CT image reconstructions and MIPs were obtained to evaluate the vascular anatomy. Carotid stenosis measurements (when applicable) are obtained utilizing NASCET criteria, using the distal internal carotid diameter as the denominator. CONTRAST:  23m OMNIPAQUE IOHEXOL 350 MG/ML SOLN COMPARISON:  Head CT yesterday. FINDINGS: CT HEAD FINDINGS Brain: No evidence of acute infarction. No accelerated brain atrophy. No mass, hemorrhage, hydrocephalus or extra-axial collection. Vascular: There is atherosclerotic calcification of the major vessels at the base of the brain. Skull: Negative Sinuses: Clear Orbits: Normal Review of the MIP images confirms the above findings CTA NECK FINDINGS Aortic arch: Aortic atherosclerosis. Branching pattern is normal without origin stenosis. Right carotid system: Common carotid artery widely patent to the bifurcation. Calcified plaque at the carotid bifurcation but no stenosis. ICA bulb widely patent. Cervical ICA is normal. Left carotid system: Common carotid artery widely patent to the bifurcation. Carotid bifurcation is normal without plaque or stenosis. Cervical ICA is normal. Vertebral arteries: Both vertebral arteries widely patent at their origins and through the cervical region to the foramen magnum. Skeleton: Ordinary cervical spondylosis. Other neck: No mass or lymphadenopathy. Upper chest: Normal Review of the MIP images confirms the above findings CTA HEAD FINDINGS Anterior circulation: Both internal carotid arteries widely patent through the skull base and siphon regions. Ordinary siphon atherosclerotic  calcification but without stenosis greater than 30%. The anterior and middle cerebral vessels are normal without proximal stenosis, aneurysm or vascular malformation. No large or medium vessel occlusion. Posterior circulation: Both vertebral arteries widely patent to the basilar. No basilar stenosis. Posterior circulation branch vessels are normal. Venous sinuses: Patent and normal. Anatomic variants: None significant. Review of the MIP images confirms the above findings IMPRESSION: 1. Mild atherosclerotic disease at the right carotid bifurcation but without stenosis. Left carotid bifurcation normal. 2. No intracranial large or medium vessel occlusion or correctable proximal stenosis. 3. Aortic atherosclerosis. Aortic Atherosclerosis (ICD10-I70.0). Electronically Signed   By: MNelson ChimesM.D.   On: 03/12/2020 10:46   DG Chest 2 View  Result Date: 03/19/2020 CLINICAL DATA:  Bacteremia, evaluate for pneumonia. EXAM: CHEST - 2 VIEW COMPARISON:  Prior chest CT 03/13/2020.  FINDINGS: A right chest infusion port catheter is present with tip terminating in the region of the superior cavoatrial junction. Heart size within normal limits. Aortic atherosclerosis. No appreciable airspace consolidation. No evidence of pleural effusion or pneumothorax. No acute bony abnormality identified. IMPRESSION: No evidence of active cardiopulmonary disease. Aortic Atherosclerosis (ICD10-I70.0). Electronically Signed   By: Kellie Simmering DO   On: 03/19/2020 12:19   CT HEAD WO CONTRAST  Result Date: 03/11/2020 CLINICAL DATA:  Slurred speech EXAM: CT HEAD WITHOUT CONTRAST TECHNIQUE: Contiguous axial images were obtained from the base of the skull through the vertex without intravenous contrast. COMPARISON:  None. FINDINGS: Brain: No evidence of acute territorial infarction, hemorrhage, hydrocephalus,extra-axial collection or mass lesion/mass effect. Normal gray-white differentiation. Ventricles are normal in size and contour. Vascular:  No hyperdense vessel or unexpected calcification. Skull: The skull is intact. No fracture or focal lesion identified. Sinuses/Orbits: The visualized paranasal sinuses and mastoid air cells are clear. The orbits and globes intact. Other: None IMPRESSION: No acute intracranial abnormality. Electronically Signed   By: Prudencio Pair M.D.   On: 03/11/2020 16:48   CT ANGIO NECK W OR WO CONTRAST  Result Date: 03/12/2020 CLINICAL DATA:  Slurred speech.  Pancreatic cancer patient. EXAM: CT ANGIOGRAPHY HEAD AND NECK TECHNIQUE: Multidetector CT imaging of the head and neck was performed using the standard protocol during bolus administration of intravenous contrast. Multiplanar CT image reconstructions and MIPs were obtained to evaluate the vascular anatomy. Carotid stenosis measurements (when applicable) are obtained utilizing NASCET criteria, using the distal internal carotid diameter as the denominator. CONTRAST:  28m OMNIPAQUE IOHEXOL 350 MG/ML SOLN COMPARISON:  Head CT yesterday. FINDINGS: CT HEAD FINDINGS Brain: No evidence of acute infarction. No accelerated brain atrophy. No mass, hemorrhage, hydrocephalus or extra-axial collection. Vascular: There is atherosclerotic calcification of the major vessels at the base of the brain. Skull: Negative Sinuses: Clear Orbits: Normal Review of the MIP images confirms the above findings CTA NECK FINDINGS Aortic arch: Aortic atherosclerosis. Branching pattern is normal without origin stenosis. Right carotid system: Common carotid artery widely patent to the bifurcation. Calcified plaque at the carotid bifurcation but no stenosis. ICA bulb widely patent. Cervical ICA is normal. Left carotid system: Common carotid artery widely patent to the bifurcation. Carotid bifurcation is normal without plaque or stenosis. Cervical ICA is normal. Vertebral arteries: Both vertebral arteries widely patent at their origins and through the cervical region to the foramen magnum. Skeleton: Ordinary  cervical spondylosis. Other neck: No mass or lymphadenopathy. Upper chest: Normal Review of the MIP images confirms the above findings CTA HEAD FINDINGS Anterior circulation: Both internal carotid arteries widely patent through the skull base and siphon regions. Ordinary siphon atherosclerotic calcification but without stenosis greater than 30%. The anterior and middle cerebral vessels are normal without proximal stenosis, aneurysm or vascular malformation. No large or medium vessel occlusion. Posterior circulation: Both vertebral arteries widely patent to the basilar. No basilar stenosis. Posterior circulation branch vessels are normal. Venous sinuses: Patent and normal. Anatomic variants: None significant. Review of the MIP images confirms the above findings IMPRESSION: 1. Mild atherosclerotic disease at the right carotid bifurcation but without stenosis. Left carotid bifurcation normal. 2. No intracranial large or medium vessel occlusion or correctable proximal stenosis. 3. Aortic atherosclerosis. Aortic Atherosclerosis (ICD10-I70.0). Electronically Signed   By: MNelson ChimesM.D.   On: 03/12/2020 10:46   CT CHEST W CONTRAST  Result Date: 03/13/2020 CLINICAL DATA:  64year old female with history of pancreatic cancer. EXAM: CT CHEST WITH  CONTRAST TECHNIQUE: Multidetector CT imaging of the chest was performed during intravenous contrast administration. CONTRAST:  7m OMNIPAQUE IOHEXOL 300 MG/ML  SOLN COMPARISON:  No priors. FINDINGS: Cardiovascular: Heart size is normal. There is no significant pericardial fluid, thickening or pericardial calcification. 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 No definite coronary artery calcifications. Right internal jugular single-lumen porta cath with tip terminating in the superior aspect of the right atrium. Mediastinum/Nodes: No pathologically enlarged mediastinal or hilar lymph nodes. Esophagus is unremarkable in  appearance. No axillary lymphadenopathy. Lungs/Pleura: No suspicious appearing pulmonary nodules or masses are noted. No acute consolidative airspace disease. No pleural effusions. Upper Abdomen: Extensive pneumobilia. Musculoskeletal: There are no aggressive appearing lytic or blastic lesions noted in the visualized portions of the skeleton. IMPRESSION: 1. No findings to suggest metastatic disease to the thorax. 2. 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 Recommend annual imaging followup by CTA or MRA. This recommendation follows 2010 ACCF/AHA/AATS/ACR/ASA/SCA/SCAI/SIR/STS/SVM Guidelines for the Diagnosis and Management of Patients with Thoracic Aortic Disease. Circulation. 2010; 121:: [id number] Aortic aneurysm NOS (ICD10-I71.9). Aortic Atherosclerosis (ICD10-I70.0). Electronically Signed   By: DVinnie LangtonM.D.   On: 03/13/2020 11:59   MR BRAIN WO CONTRAST  Result Date: 03/12/2020 CLINICAL DATA:  Transit ischemic attack.  Slurred speech. EXAM: MRI HEAD WITHOUT CONTRAST TECHNIQUE: Multiplanar, multiecho pulse sequences of the brain and surrounding structures were obtained without intravenous contrast. COMPARISON:  03/11/2020 head CT.  03/12/2020 CTA head and neck. FINDINGS: Brain: No acute infarct or intracranial hemorrhage. Cerebral volume is within normal limits. Minimal chronic microvascular ischemic changes. No midline shift, ventriculomegaly or extra-axial fluid collection. No mass lesion. Vascular: Please see same day CTA. Skull and upper cervical spine: Normal marrow signal. Sinuses/Orbits: Normal orbits. Clear paranasal sinuses. No mastoid effusion. Other: None. IMPRESSION: No acute intracranial process. Minimal chronic microvascular ischemic changes. Electronically Signed   By: CPrimitivo GauzeM.D.   On: 03/12/2020 11:49   [facility]  Result Date: 03/12/2020 CLINICAL DATA:  64year old female with a history of  TIA EXAM: BILATERAL CAROTID DUPLEX ULTRASOUND TECHNIQUE: GPearline Cablesscale imaging, color Doppler and duplex ultrasound were performed of bilateral carotid and vertebral arteries in the neck. COMPARISON:  None. FINDINGS: Criteria: Quantification of carotid stenosis is based on velocity parameters that correlate the residual internal carotid diameter with NASCET-based stenosis levels, using the diameter of the distal internal carotid lumen as the denominator for stenosis measurement. The following velocity measurements were obtained: RIGHT ICA:  Systolic 1427cm/sec, Diastolic 52 cm/sec CCA:  98 cm/sec SYSTOLIC ICA/CCA RATIO:  1.6 ECA:  170 cm/sec LEFT ICA:  Systolic 89 cm/sec, Diastolic 14 cm/sec CCA:  95 cm/sec SYSTOLIC ICA/CCA RATIO:  0.9 ECA:  119 cm/sec Right Brachial SBP: Not acquired Left Brachial SBP: Not acquired RIGHT CAROTID ARTERY: No significant calcifications of the right common carotid artery. Intermediate waveform maintained. Heterogeneous and partially calcified plaque at the right carotid bifurcation. No significant lumen shadowing. Low resistance waveform of the right ICA. No significant tortuosity. RIGHT VERTEBRAL ARTERY: Antegrade flow with low resistance waveform. LEFT CAROTID ARTERY: No significant calcifications of the left common carotid artery. Intermediate waveform maintained. Heterogeneous and partially calcified plaque at the left carotid bifurcation without significant lumen shadowing. Low resistance waveform of the left ICA. No significant tortuosity. LEFT VERTEBRAL ARTERY:  Antegrade flow with low resistance waveform. IMPRESSION: Right: Heterogeneous and partially calcified plaque at the right carotid bifurcation,  with discordant results regarding degree of stenosis by established duplex criteria. Peak velocity suggests 50%-69% stenosis, with the ICA/ CCA ratio suggesting a lesser degree of stenosis. If establishing a more accurate degree of stenosis is required, cerebral angiogram should be  considered, or as a second best test, CTA. Left: Color duplex indicates minimal heterogeneous and calcified plaque, with no hemodynamically significant stenosis by duplex criteria in the extracranial cerebrovascular circulation. Signed, Dulcy Fanny. Dellia Nims, RPVI [facility] Electronically Signed   By: Corrie Mckusick D.O.   On: 03/12/2020 14:02   PERIPHERAL VASCULAR CATHETERIZATION  Result Date: 03/07/2020 See op note  ECHOCARDIOGRAM COMPLETE  Result Date: 03/12/2020    ECHOCARDIOGRAM REPORT   Patient Name:   Cassandra [name] Date of Exam: 03/12/2020 Medical Rec #:  [id number]   Height:       67.0 in Accession #:    [id number]  Weight:       145.3 lb Date of Birth:  17-Apr-1956   BSA:          1.765 m Patient Age:    29 years    BP:           125/74 mmHg Patient Gender: F           HR:           63 bpm. Exam Location:  [facility] Procedure: 2D Echo, Cardiac Doppler and Color Doppler Indications:     TIA 435.9  History:         Patient has no prior history of Echocardiogram examinations. No                  cardiac hsitory listed.  Sonographer:     Sherrie Sport RDCS (AE) Referring Phys:  [id number] Sidney Ace Diagnosing Phys: Kate Sable MD IMPRESSIONS  1. Left ventricular ejection fraction, by estimation, is 65 to 70%. The left ventricle has normal function. The left ventricle has no regional wall motion abnormalities. Left ventricular diastolic parameters were normal.  2. Right ventricular systolic function is normal. The right ventricular size is normal. There is normal pulmonary artery systolic pressure.  3. The mitral valve is normal in structure. No evidence of mitral valve regurgitation. No evidence of mitral stenosis.  4. The aortic valve is normal in structure. Aortic valve regurgitation is not visualized. No aortic stenosis is present.  5. The inferior vena cava is normal in size with greater than 50% respiratory variability, suggesting right  atrial pressure of 3 mmHg. FINDINGS  Left Ventricle: Left ventricular ejection fraction, by estimation, is 65 to 70%. The left ventricle has normal function. The left ventricle has no regional wall motion abnormalities. The left ventricular internal cavity size was normal in size. There is  no left ventricular hypertrophy. Left ventricular diastolic parameters were normal. Right Ventricle: The right ventricular size is normal. No increase in right ventricular wall thickness. Right ventricular systolic function is normal. There is normal pulmonary artery systolic pressure. The tricuspid regurgitant velocity is 2.57 m/s, and  with an assumed right atrial pressure of 3 mmHg, the estimated right ventricular systolic pressure is 68.1 mmHg. Left Atrium: Left atrial size was normal in size. Right Atrium: Right atrial size was normal in size. Pericardium: There is no evidence of pericardial effusion. Mitral Valve: The mitral valve is normal in structure. No evidence of mitral valve regurgitation. No evidence of mitral valve stenosis. Tricuspid Valve: The tricuspid valve is normal in structure. Tricuspid valve  regurgitation is not demonstrated. No evidence of tricuspid stenosis. Aortic Valve: The aortic valve is normal in structure. Aortic valve regurgitation is not visualized. No aortic stenosis is present. Aortic valve mean gradient measures 4.5 mmHg. Aortic valve peak gradient measures 7.3 mmHg. Aortic valve area, by VTI measures 3.23 cm. Pulmonic Valve: The pulmonic valve was normal in structure. Pulmonic valve regurgitation is not visualized. No evidence of pulmonic stenosis. Aorta: The aortic root is normal in size and structure. Venous: The inferior vena cava is normal in size with greater than 50% respiratory variability, suggesting right atrial pressure of 3 mmHg. IAS/Shunts: No atrial level shunt detected by color flow Doppler.  LEFT VENTRICLE PLAX 2D LVIDd:         4.00 cm  Diastology LVIDs:         2.28 cm  LV  e' medial:    7.72 cm/s LV PW:         1.50 cm  LV E/e' medial:  10.8 LV IVS:        0.90 cm  LV e' lateral:   11.50 cm/s LVOT diam:     2.00 cm  LV E/e' lateral: 7.3 LV SV:         96 LV SV Index:   54 LVOT Area:     3.14 cm  RIGHT VENTRICLE RV Basal diam:  3.77 cm RV S prime:     19.00 cm/s TAPSE (M-mode): 3.7 cm LEFT ATRIUM             Index       RIGHT ATRIUM           Index LA diam:        2.30 cm 1.30 cm/m  RA Area:     16.70 cm LA Vol (A2C):   45.2 ml 25.60 ml/m RA Volume:   45.40 ml  25.72 ml/m LA Vol (A4C):   35.1 ml 19.88 ml/m LA Biplane Vol: 39.1 ml 22.15 ml/m  AORTIC VALVE                   PULMONIC VALVE AV Area (Vmax):    3.22 cm    PV Vmax:        0.79 m/s AV Area (Vmean):   3.07 cm    PV Peak grad:   2.5 mmHg AV Area (VTI):     3.23 cm    RVOT Peak grad: 4 mmHg AV Vmax:           135.50 cm/s AV Vmean:          99.250 cm/s AV VTI:            0.297 m AV Peak Grad:      7.3 mmHg AV Mean Grad:      4.5 mmHg LVOT Vmax:         139.00 cm/s LVOT Vmean:        97.100 cm/s LVOT VTI:          0.305 m LVOT/AV VTI ratio: 1.03  AORTA Ao Root diam: 3.50 cm MITRAL VALVE               TRICUSPID VALVE MV Area (PHT): 2.39 cm    TR Peak grad:   26.4 mmHg MV Decel Time: 317 msec    TR Vmax:        257.00 cm/s MV E velocity: 83.40 cm/s MV A velocity: 72.30 cm/s  SHUNTS MV E/A ratio:  1.15  Systemic VTI:  0.30 m                            Systemic Diam: 2.00 cm Kate Sable MD Electronically signed by Kate Sable MD Signature Date/Time: 03/12/2020/4:01:51 PM    Final    ECHO TEE  Result Date: 03/21/2020    TRANSESOPHOGEAL ECHO REPORT   Patient Name:   Cassandra [name] Date of Exam: 03/21/2020 Medical Rec #:  [id number]   Height:       67.0 in Accession #:    [id number]  Weight:       138.0 lb Date of Birth:  [birth year]   BSA:          1.727 m Patient Age:    55 years    BP:           133/83 mmHg Patient Gender: F           HR:           83 bpm. Exam Location:  [facility] Procedure: Transesophageal Echo,  Color Doppler, Cardiac Doppler and Saline            Contrast Bubble Study Indications:     Not listed on order  History:         Patient has prior history of Echocardiogram examinations, most                  recent 03/12/2020.  Sonographer:     Sherrie Sport RDCS (AE) Referring Phys:  [place] Diagnosing Phys: Neoma Laming MD PROCEDURE: The transesophogeal probe was passed without difficulty through the esophogus of the patient. Local oropharyngeal anesthetic was provided with Benzocaine spray. Sedation performed by performing physician. The patient's vital signs; including heart rate, blood pressure, and oxygen saturation; remained stable throughout the procedure. The patient developed no complications during the procedure. IMPRESSIONS  1. Left ventricular ejection fraction, by estimation, is 60 to 65%. The left ventricle has normal function. The left ventricle has no regional wall motion abnormalities.  2. Right ventricular systolic function is normal. The right ventricular size is normal.  3. No left atrial/left atrial appendage thrombus was detected.  4. The mitral valve is normal in structure. Mild mitral valve regurgitation. No evidence of mitral stenosis.  5. The aortic valve is abnormal. Aortic valve regurgitation is mild. No aortic stenosis is present.  6. The inferior vena cava is normal in size with greater than 50% respiratory variability, suggesting right atrial pressure of 3 mmHg.  7. Agitated saline contrast bubble study was negative, with no evidence of any interatrial shunt. Conclusion(s)/Recommendation(s): No evidence of vegetation/infective endocarditis on this transesophageal echocardiogram. FINDINGS  Left Ventricle: Left ventricular ejection fraction, by estimation, is 60 to 65%. The left ventricle has normal function. The left ventricle has no regional wall motion abnormalities. The left ventricular internal cavity size was normal in size. There is  no left ventricular hypertrophy.  Right Ventricle: The right ventricular size is normal. No increase in right ventricular wall thickness. Right ventricular systolic function is normal. Left Atrium: Left atrial size was normal in size. No left atrial/left atrial appendage thrombus was detected. Right Atrium: Right atrial size was normal in size. Pericardium: There is no evidence of pericardial effusion. Mitral Valve: The mitral valve is normal in structure. Mild mitral valve regurgitation. No evidence of mitral valve stenosis. Tricuspid Valve: The tricuspid valve is normal in structure. Tricuspid valve regurgitation is mild .  No evidence of tricuspid stenosis. Aortic Valve: The aortic valve is abnormal. Aortic valve regurgitation is mild. No aortic stenosis is present. Pulmonic Valve: The pulmonic valve was normal in structure. Pulmonic valve regurgitation is trivial. No evidence of pulmonic stenosis. Aorta: The aortic root is normal in size and structure. Venous: The inferior vena cava is normal in size with greater than 50% respiratory variability, suggesting right atrial pressure of 3 mmHg. IAS/Shunts: No atrial level shunt detected by color flow Doppler. Agitated saline contrast was given intravenously to evaluate for intracardiac shunting. Agitated saline contrast bubble study was negative, with no evidence of any interatrial shunt. There  is no evidence of a patent foramen ovale. No ventricular septal defect is seen or detected. There is no evidence of an atrial septal defect. Neoma Laming MD Electronically signed by Neoma Laming MD Signature Date/Time: 03/21/2020/8:39:59 AM    Final       Subjective: Pt c/o fatigue   Discharge Exam: Vitals:   03/21/20 0825 03/21/20 0846  BP: 110/70 108/74  Pulse: 66 66  Resp: 12   Temp:  98.4 F (36.9 C)  SpO2: 97% 98%   Vitals:   03/21/20 0800 03/21/20 0815 03/21/20 0825 03/21/20 0846  BP: 121/76 122/75 110/70 108/74  Pulse: 72 71 66 66  Resp: _0 Temp:    98.4 F (36.9 C)   TempSrc:    Oral  SpO2: 99% 98% 97% 98%  Weight:      Height:        General: Pt is alert, awake, not in acute distress Cardiovascular: S1/S2 +, no rubs, no gallops Respiratory: CTA bilaterally, no wheezing, no rhonchi Abdominal: Soft, NT, ND, bowel sounds + Extremities: no cyanosis    The results of significant diagnostics from this hospitalization (including imaging, microbiology, ancillary and laboratory) are listed below for reference.     Microbiology: Recent Results (from the past 240 hour(s))  SARS Coronavirus 2 by RT PCR (hospital order, performed in [facility] hospital lab) Nasopharyngeal Nasopharyngeal Swab     Status: None   Collection Time: 03/11/20  8:54 PM   Specimen: Nasopharyngeal Swab  Result Value Ref Range Status   SARS Coronavirus 2 NEGATIVE NEGATIVE Final    Comment: (NOTE) SARS-CoV-2 target nucleic acids are NOT DETECTED.  The SARS-CoV-2 RNA is generally detectable in upper and lower respiratory specimens during the acute phase of infection. The lowest concentration of SARS-CoV-2 viral copies this assay can detect is 250 copies / mL. A negative result does not preclude SARS-CoV-2 infection and should not be used as the sole basis for treatment or other patient management decisions.  A negative result may occur with improper specimen collection / handling, submission of specimen other than nasopharyngeal swab, presence of viral mutation(s) within the areas targeted by this assay, and inadequate number of viral copies (<250 copies / mL). A negative result must be combined with clinical observations, patient history, and epidemiological information.  Fact Sheet for Patients:   StrictlyIdeas.no  Fact Sheet for Healthcare Providers: BankingDealers.co.za  This test is not yet approved or  cleared by the [place] FDA and has been authorized for detection and/or diagnosis of SARS-CoV-2 by FDA under an  Emergency Use Authorization (EUA).  This EUA will remain in effect (meaning this test can be used) for the duration of the COVID-19 declaration under Section 564(b)(1) of the Act, 21 U.S.C. section 360bbb-3(b)(1), unless the authorization is terminated or revoked sooner.  Performed at [facility],  [place], [place] [address]   Urine culture     Status: Abnormal   Collection Time: 03/18/20  1:32 PM   Specimen: Urine, Clean Catch  Result Value Ref Range Status   Specimen Description   Final    URINE, CLEAN CATCH Performed at [facility], [address], [place], [place] [address]    Special Requests   Final    NONE Performed at [facility], [address] [place], [place] [address]    Culture MULTIPLE SPECIES PRESENT, SUGGEST RECOLLECTION (A)  Final   Report Status 03/19/2020 FINAL  Final  Culture, blood (routine x 2)     Status: Abnormal   Collection Time: 03/18/20  2:00 PM   Specimen: BLOOD  Result Value Ref Range Status   Specimen Description   Final    BLOOD RIGHT ARM Performed at [facility] [address] [place], [place] [address]    Special Requests   Final    BOTTLES DRAWN AEROBIC AND ANAEROBIC Blood Culture adequate volume Performed at [facility], [address] [place], [place] [address]    Culture  Setup Time   Final    GRAM POSITIVE COCCI CRITICAL RESULT CALLED TO, READ BACK BY AND VERIFIED WITH: JASON ROBBINS @ [phone] 03/19/20 RH IN BOTH AEROBIC AND ANAEROBIC BOTTLES    Culture (A)  Final    STREPTOCOCCUS PNEUMONIAE SUSCEPTIBILITIES PERFORMED ON PREVIOUS CULTURE WITHIN THE LAST 5 DAYS. Performed at [facility], [place] [address], [place], [place] [address]    Report Status 03/21/2020 FINAL  Final  Culture, blood (routine x 2)     Status: Abnormal   Collection Time: 03/18/20  2:15 PM   Specimen: BLOOD  Result Value Ref Range Status   Specimen Description   Final    BLOOD LEFT ARM Performed  at [facility], [address], [place], [place] [address]    Special Requests   Final    BOTTLES DRAWN AEROBIC AND ANAEROBIC Blood Culture adequate volume Performed at [facility], [address], [place], [place] [address]    Culture  Setup Time   Final    GRAM POSITIVE COCCI IN BOTH AEROBIC AND ANAEROBIC BOTTLES CRITICAL RESULT CALLED TO, READ BACK BY AND VERIFIED WITH: JASON ROBBINS 03/19/20 1443 RH Performed at [facility], [place] [address], [place], [place] [address]    Culture STREPTOCOCCUS PNEUMONIAE (A)  Final   Report Status 03/21/2020 FINAL  Final   Organism ID, Bacteria STREPTOCOCCUS PNEUMONIAE  Final      Susceptibility   Streptococcus pneumoniae - MIC*    ERYTHROMYCIN <=0.12 SENSITIVE Sensitive     LEVOFLOXACIN 0.5 SENSITIVE Sensitive     VANCOMYCIN 0.5 SENSITIVE Sensitive     PENICILLIN (meningitis) <=0.06 SENSITIVE Sensitive     PENO - penicillin <=0.06      PENICILLIN (non-meningitis) <=0.06 SENSITIVE Sensitive     PENICILLIN (oral) <=0.06 SENSITIVE Sensitive     CEFTRIAXONE (non-meningitis) <=0.12 SENSITIVE Sensitive     CEFTRIAXONE (meningitis) <=0.12 SENSITIVE Sensitive     * STREPTOCOCCUS PNEUMONIAE  Blood Culture ID Panel (Reflexed)     Status: Abnormal   Collection Time: 03/18/20  2:15 PM  Result Value Ref Range Status   Enterococcus faecalis NOT DETECTED NOT DETECTED Final   Enterococcus Faecium NOT DETECTED NOT DETECTED Final   Listeria monocytogenes NOT DETECTED NOT DETECTED Final   Staphylococcus species NOT DETECTED NOT DETECTED Final   Staphylococcus  aureus (BCID) NOT DETECTED NOT DETECTED Final   Staphylococcus epidermidis NOT DETECTED NOT DETECTED Final   Staphylococcus lugdunensis NOT DETECTED NOT DETECTED Final   Streptococcus species DETECTED (A) NOT DETECTED Final    Comment: CRITICAL RESULT CALLED TO, READ BACK BY AND VERIFIED WITH: JASON ROBBIN @ 2111 03/19/2020 RH    Streptococcus agalactiae NOT DETECTED NOT DETECTED  Final   Streptococcus pneumoniae DETECTED (A) NOT DETECTED Final    Comment: CRITICAL RESULT CALLED TO, READ BACK BY AND VERIFIED WITH: JASON ROBBINS @ [phone] 03/19/2020 RH    Streptococcus pyogenes NOT DETECTED NOT DETECTED Final   A.calcoaceticus-baumannii NOT DETECTED NOT DETECTED Final   Bacteroides fragilis NOT DETECTED NOT DETECTED Final   Enterobacterales NOT DETECTED NOT DETECTED Final   Enterobacter cloacae complex NOT DETECTED NOT DETECTED Final   Escherichia coli NOT DETECTED NOT DETECTED Final   Klebsiella aerogenes NOT DETECTED NOT DETECTED Final   Klebsiella oxytoca NOT DETECTED NOT DETECTED Final   Klebsiella pneumoniae NOT DETECTED NOT DETECTED Final   Proteus species NOT DETECTED NOT DETECTED Final   Salmonella species NOT DETECTED NOT DETECTED Final   Serratia marcescens NOT DETECTED NOT DETECTED Final   Haemophilus influenzae NOT DETECTED NOT DETECTED Final   Neisseria meningitidis NOT DETECTED NOT DETECTED Final   Pseudomonas aeruginosa NOT DETECTED NOT DETECTED Final   Stenotrophomonas maltophilia NOT DETECTED NOT DETECTED Final   Candida albicans NOT DETECTED NOT DETECTED Final   Candida auris NOT DETECTED NOT DETECTED Final   Candida glabrata NOT DETECTED NOT DETECTED Final   Candida krusei NOT DETECTED NOT DETECTED Final   Candida parapsilosis NOT DETECTED NOT DETECTED Final   Candida tropicalis NOT DETECTED NOT DETECTED Final   Cryptococcus neoformans/gattii NOT DETECTED NOT DETECTED Final    Comment: Performed at [facility], [address] [place], [place] [address]  SARS Coronavirus 2 by RT PCR (hospital order, performed in [facility] hospital lab) Nasopharyngeal Nasopharyngeal Swab     Status: None   Collection Time: 03/19/20 10:24 AM   Specimen: Nasopharyngeal Swab  Result Value Ref Range Status   SARS Coronavirus 2 NEGATIVE NEGATIVE Final    Comment: (NOTE) SARS-CoV-2 target nucleic acids are NOT DETECTED.  The SARS-CoV-2 RNA is generally  detectable in upper and lower respiratory specimens during the acute phase of infection. The lowest concentration of SARS-CoV-2 viral copies this assay can detect is 250 copies / mL. A negative result does not preclude SARS-CoV-2 infection and should not be used as the sole basis for treatment or other patient management decisions.  A negative result may occur with improper specimen collection / handling, submission of specimen other than nasopharyngeal swab, presence of viral mutation(s) within the areas targeted by this assay, and inadequate number of viral copies (<250 copies / mL). A negative result must be combined with clinical observations, patient history, and epidemiological information.  Fact Sheet for Patients:   StrictlyIdeas.no  Fact Sheet for Healthcare Providers: BankingDealers.co.za  This test is not yet approved or  cleared by the [place] FDA and has been authorized for detection and/or diagnosis of SARS-CoV-2 by FDA under an Emergency Use Authorization (EUA).  This EUA will remain in effect (meaning this test can be used) for the duration of the COVID-19 declaration under Section 564(b)(1) of the Act, 21 U.S.C. section 360bbb-3(b)(1), unless the authorization is terminated or revoked sooner.  Performed at [facility], [address] [place],  [address]   CULTURE, BLOOD (ROUTINE X 2) w  Reflex to ID Panel     Status: None (Preliminary result)   Collection Time: 03/21/20 12:44 AM   Specimen: BLOOD  Result Value Ref Range Status   Specimen Description BLOOD BLOOD RIGHT HAND  Final   Special Requests   Final    BOTTLES DRAWN AEROBIC AND ANAEROBIC Blood Culture results may not be optimal due to an excessive volume of blood received in culture bottles   Culture   Final    NO GROWTH < 12 HOURS Performed at [facility], [address], [place], [place] [address]    Report Status PENDING   Incomplete  CULTURE, BLOOD (ROUTINE X 2) w Reflex to ID Panel     Status: None (Preliminary result)   Collection Time: 03/21/20 12:50 AM   Specimen: BLOOD  Result Value Ref Range Status   Specimen Description BLOOD LEFT ANTECUBITAL  Final   Special Requests   Final    BOTTLES DRAWN AEROBIC AND ANAEROBIC Blood Culture results may not be optimal due to an excessive volume of blood received in culture bottles   Culture   Final    NO GROWTH < 12 HOURS Performed at [facility], [address] [place], [place] [address]    Report Status PENDING  Incomplete     Labs: BNP (last 3 results) No results for input(s): BNP in the last 8760 hours. Basic Metabolic Panel: Recent Labs  Lab 03/18/20 1257 03/18/20 1415 03/19/20 1024 03/20/20 0411 03/21/20 0318  NA 132* 132* 129* 136 140  K 4.0 4.0 3.8 4.0 3.7  CL 98 98 95* 104 108  CO2 _0 GLUCOSE 134* 138* 145* 126* 114*  BUN _1 7* 6*  CREATININE 0.69 0.81 0.85 0.66 0.52  CALCIUM 8.6* 8.5* 8.9 8.3* 8.2*   Liver Function Tests: Recent Labs  Lab 03/18/20 1257 03/18/20 1415 03/19/20 1024 03/21/20 0318  AST _2 52*  ALT 37 36 43 74*  ALKPHOS 123 124 125 112  BILITOT 1.9* 1.7* 1.5* 1.0  PROT 6.8 6.7 7.1 5.3*  ALBUMIN 3.4* 3.6 3.5 2.5*   No results for input(s): LIPASE, AMYLASE in the last 168 hours. No results for input(s): AMMONIA in the last 168 hours. CBC: Recent Labs  Lab 03/18/20 1257 03/19/20 1024 03/20/20 0411 03/21/20 0318  WBC 10.3 8.2 4.7 3.8*  NEUTROABS 8.5* 7.0  --   --   HGB 11.9* 11.4* 9.5* 9.0*  HCT 31.8* 30.8* 25.6* 24.9*  MCV 86.6 89.8 90.5 91.2  PLT 256 191 156 174   Cardiac Enzymes: No results for input(s): CKTOTAL, CKMB, CKMBINDEX, TROPONINI in the last 168 hours. BNP: Invalid input(s): POCBNP CBG: No results for input(s): GLUCAP in the last 168 hours. D-Dimer No results for input(s): DDIMER in the last 72 hours. Hgb A1c No results for input(s): HGBA1C in the  last 72 hours. Lipid Profile No results for input(s): CHOL, HDL, LDLCALC, TRIG, CHOLHDL, LDLDIRECT in the last 72 hours. Thyroid function studies No results for input(s): TSH, T4TOTAL, T3FREE, THYROIDAB in the last 72 hours.  Invalid input(s): FREET3 Anemia work up No results for input(s): VITAMINB12, FOLATE, FERRITIN, TIBC, IRON, RETICCTPCT in the last 72 hours. Urinalysis    Component Value Date/Time   COLORURINE AMBER (A) 03/18/2020 1332   APPEARANCEUR HAZY (A) 03/18/2020 1332   LABSPEC 1.023 03/18/2020 1332   PHURINE 6.0 03/18/2020 1332   GLUCOSEU NEGATIVE 03/18/2020 1332   HGBUR NEGATIVE 03/18/2020 1332   BILIRUBINUR NEGATIVE  03/18/2020 1332   BILIRUBINUR Positive 02/06/2020 1022   KETONESUR 20 (A) 03/18/2020 1332   PROTEINUR 30 (A) 03/18/2020 1332   UROBILINOGEN 0.2 02/06/2020 1022   NITRITE NEGATIVE 03/18/2020 1332   LEUKOCYTESUR SMALL (A) 03/18/2020 1332   Sepsis Labs Invalid input(s): PROCALCITONIN,  WBC,  LACTICIDVEN Microbiology Recent Results (from the past 240 hour(s))  SARS Coronavirus 2 by RT PCR (hospital order, performed in [facility] hospital lab) Nasopharyngeal Nasopharyngeal Swab     Status: None   Collection Time: 03/11/20  8:54 PM   Specimen: Nasopharyngeal Swab  Result Value Ref Range Status   SARS Coronavirus 2 NEGATIVE NEGATIVE Final    Comment: (NOTE) SARS-CoV-2 target nucleic acids are NOT DETECTED.  The SARS-CoV-2 RNA is generally detectable in upper and lower respiratory specimens during the acute phase of infection. The lowest concentration of SARS-CoV-2 viral copies this assay can detect is 250 copies / mL. A negative result does not preclude SARS-CoV-2 infection and should not be used as the sole basis for treatment or other patient management decisions.  A negative result may occur with improper specimen collection / handling, submission of specimen other than nasopharyngeal swab, presence of viral mutation(s) within the areas targeted  by this assay, and inadequate number of viral copies (<250 copies / mL). A negative result must be combined with clinical observations, patient history, and epidemiological information.  Fact Sheet for Patients:   StrictlyIdeas.no  Fact Sheet for Healthcare Providers: BankingDealers.co.za  This test is not yet approved or  cleared by the [place] FDA and has been authorized for detection and/or diagnosis of SARS-CoV-2 by FDA under an Emergency Use Authorization (EUA).  This EUA will remain in effect (meaning this test can be used) for the duration of the COVID-19 declaration under Section 564(b)(1) of the Act, 21 U.S.C. section 360bbb-3(b)(1), unless the authorization is terminated or revoked sooner.  Performed at [facility], [address], [place], [place] [address]   Urine culture     Status: Abnormal   Collection Time: 03/18/20  1:32 PM   Specimen: Urine, Clean Catch  Result Value Ref Range Status   Specimen Description   Final    URINE, CLEAN CATCH Performed at [facility], [address], [place], [place] [address]    Special Requests   Final    NONE Performed at [facility], [address] [place], [place] [address]    Culture MULTIPLE SPECIES PRESENT, SUGGEST RECOLLECTION (A)  Final   Report Status 03/19/2020 FINAL  Final  Culture, blood (routine x 2)     Status: Abnormal   Collection Time: 03/18/20  2:00 PM   Specimen: BLOOD  Result Value Ref Range Status   Specimen Description   Final    BLOOD RIGHT ARM Performed at [facility], [address], [place], [place] [address]    Special Requests   Final    BOTTLES DRAWN AEROBIC AND ANAEROBIC Blood Culture adequate volume Performed at [facility], [address] [place], [place] [address]    Culture  Setup Time   Final    GRAM POSITIVE COCCI CRITICAL RESULT CALLED TO, READ BACK BY AND VERIFIED WITH: JASON ROBBINS @  [phone] 03/19/20 RH IN BOTH AEROBIC AND ANAEROBIC BOTTLES    Culture (A)  Final    STREPTOCOCCUS PNEUMONIAE SUSCEPTIBILITIES PERFORMED ON PREVIOUS CULTURE WITHIN THE LAST 5 DAYS. Performed at [facility], [place] [address] [place], [place] [address]    Report  Status 03/21/2020 FINAL  Final  Culture, blood (routine x 2)     Status: Abnormal   Collection Time: 03/18/20  2:15 PM   Specimen: BLOOD  Result Value Ref Range Status   Specimen Description   Final    BLOOD LEFT ARM Performed at [facility], [address], [place], [place] [address]    Special Requests   Final    BOTTLES DRAWN AEROBIC AND ANAEROBIC Blood Culture adequate volume Performed at [facility], [address], [place], [place] [address]    Culture  Setup Time   Final    GRAM POSITIVE COCCI IN BOTH AEROBIC AND ANAEROBIC BOTTLES CRITICAL RESULT CALLED TO, READ BACK BY AND VERIFIED WITH: JASON ROBBINS 03/19/20 9024 RH Performed at [facility], [place] [address], [place], [place] [address]    Culture STREPTOCOCCUS PNEUMONIAE (A)  Final   Report Status 03/21/2020 FINAL  Final   Organism ID, Bacteria STREPTOCOCCUS PNEUMONIAE  Final      Susceptibility   Streptococcus pneumoniae - MIC*    ERYTHROMYCIN <=0.12 SENSITIVE Sensitive     LEVOFLOXACIN 0.5 SENSITIVE Sensitive     VANCOMYCIN 0.5 SENSITIVE Sensitive     PENICILLIN (meningitis) <=0.06 SENSITIVE Sensitive     PENO - penicillin <=0.06      PENICILLIN (non-meningitis) <=0.06 SENSITIVE Sensitive     PENICILLIN (oral) <=0.06 SENSITIVE Sensitive     CEFTRIAXONE (non-meningitis) <=0.12 SENSITIVE Sensitive     CEFTRIAXONE (meningitis) <=0.12 SENSITIVE Sensitive     * STREPTOCOCCUS PNEUMONIAE  Blood Culture ID Panel (Reflexed)     Status: Abnormal   Collection Time: 03/18/20  2:15 PM  Result Value Ref Range Status   Enterococcus faecalis NOT DETECTED NOT DETECTED Final   Enterococcus Faecium NOT DETECTED NOT DETECTED Final   Listeria  monocytogenes NOT DETECTED NOT DETECTED Final   Staphylococcus species NOT DETECTED NOT DETECTED Final   Staphylococcus aureus (BCID) NOT DETECTED NOT DETECTED Final   Staphylococcus epidermidis NOT DETECTED NOT DETECTED Final   Staphylococcus lugdunensis NOT DETECTED NOT DETECTED Final   Streptococcus species DETECTED (A) NOT DETECTED Final    Comment: CRITICAL RESULT CALLED TO, READ BACK BY AND VERIFIED WITH: JASON ROBBIN @ 3299 03/19/2020 RH    Streptococcus agalactiae NOT DETECTED NOT DETECTED Final   Streptococcus pneumoniae DETECTED (A) NOT DETECTED Final    Comment: CRITICAL RESULT CALLED TO, READ BACK BY AND VERIFIED WITH: JASON ROBBINS @ [phone] 03/19/2020 RH    Streptococcus pyogenes NOT DETECTED NOT DETECTED Final   A.calcoaceticus-baumannii NOT DETECTED NOT DETECTED Final   Bacteroides fragilis NOT DETECTED NOT DETECTED Final   Enterobacterales NOT DETECTED NOT DETECTED Final   Enterobacter cloacae complex NOT DETECTED NOT DETECTED Final   Escherichia coli NOT DETECTED NOT DETECTED Final   Klebsiella aerogenes NOT DETECTED NOT DETECTED Final   Klebsiella oxytoca NOT DETECTED NOT DETECTED Final   Klebsiella pneumoniae NOT DETECTED NOT DETECTED Final   Proteus species NOT DETECTED NOT DETECTED Final   Salmonella species NOT DETECTED NOT DETECTED Final   Serratia marcescens NOT DETECTED NOT DETECTED Final   Haemophilus influenzae NOT DETECTED NOT DETECTED Final   Neisseria meningitidis NOT DETECTED NOT DETECTED Final   Pseudomonas aeruginosa NOT DETECTED NOT DETECTED Final   Stenotrophomonas maltophilia NOT DETECTED NOT DETECTED Final   Candida albicans NOT DETECTED NOT DETECTED Final   Candida auris NOT DETECTED NOT DETECTED Final   Candida glabrata NOT DETECTED NOT DETECTED Final   Candida krusei NOT DETECTED  NOT DETECTED Final   Candida parapsilosis NOT DETECTED NOT DETECTED Final   Candida tropicalis NOT DETECTED NOT DETECTED Final   Cryptococcus neoformans/gattii NOT  DETECTED NOT DETECTED Final    Comment: Performed at [facility], [address] [place], [place] [address]  SARS Coronavirus 2 by RT PCR (hospital order, performed in [facility] hospital lab) Nasopharyngeal Nasopharyngeal Swab     Status: None   Collection Time: 03/19/20 10:24 AM   Specimen: Nasopharyngeal Swab  Result Value Ref Range Status   SARS Coronavirus 2 NEGATIVE NEGATIVE Final    Comment: (NOTE) SARS-CoV-2 target nucleic acids are NOT DETECTED.  The SARS-CoV-2 RNA is generally detectable in upper and lower respiratory specimens during the acute phase of infection. The lowest concentration of SARS-CoV-2 viral copies this assay can detect is 250 copies / mL. A negative result does not preclude SARS-CoV-2 infection and should not be used as the sole basis for treatment or other patient management decisions.  A negative result may occur with improper specimen collection / handling, submission of specimen other than nasopharyngeal swab, presence of viral mutation(s) within the areas targeted by this assay, and inadequate number of viral copies (<250 copies / mL). A negative result must be combined with clinical observations, patient history, and epidemiological information.  Fact Sheet for Patients:   StrictlyIdeas.no  Fact Sheet for Healthcare Providers: BankingDealers.co.za  This test is not yet approved or  cleared by the [place] FDA and has been authorized for detection and/or diagnosis of SARS-CoV-2 by FDA under an Emergency Use Authorization (EUA).  This EUA will remain in effect (meaning this test can be used) for the duration of the COVID-19 declaration under Section 564(b)(1) of the Act, 21 U.S.C. section 360bbb-3(b)(1), unless the authorization is terminated or revoked sooner.  Performed at [facility], [address] [place], [place] [address]   CULTURE, BLOOD (ROUTINE X 2) w Reflex to  ID Panel     Status: None (Preliminary result)   Collection Time: 03/21/20 12:44 AM   Specimen: BLOOD  Result Value Ref Range Status   Specimen Description BLOOD BLOOD RIGHT HAND  Final   Special Requests   Final    BOTTLES DRAWN AEROBIC AND ANAEROBIC Blood Culture results may not be optimal due to an excessive volume of blood received in culture bottles   Culture   Final    NO GROWTH < 12 HOURS Performed at [facility], [address] [place], [place] [address]    Report Status PENDING  Incomplete  CULTURE, BLOOD (ROUTINE X 2) w Reflex to ID Panel     Status: None (Preliminary result)   Collection Time: 03/21/20 12:50 AM   Specimen: BLOOD  Result Value Ref Range Status   Specimen Description BLOOD LEFT ANTECUBITAL  Final   Special Requests   Final    BOTTLES DRAWN AEROBIC AND ANAEROBIC Blood Culture results may not be optimal due to an excessive volume of blood received in culture bottles   Culture   Final    NO GROWTH < 12 HOURS Performed at [facility], [address], [place], [place] [address]    Report Status PENDING  Incomplete     Time coordinating discharge: Over 30 minutes  SIGNED:   Wyvonnia Dusky, MD  [facility] 03/21/2020, 12:33 PM Pager   If 7PM-7AM, please contact night-coverage [email or web address]

## 2020-03-21 NOTE — Progress Notes (Signed)
SUBJECTIVE: Patient tolerated transesophageal echocardiogram   Vitals:   03/21/20 0800 03/21/20 0815 03/21/20 0825 03/21/20 0846  BP: 121/76 122/75 110/70 108/74  Pulse: 72 71 66 66  Resp: 20 16 12    Temp:    98.4 F (36.9 C)  TempSrc:    Oral  SpO2: 99% 98% 97% 98%  Weight:      Height:        Intake/Output Summary (Last 24 hours) at 03/21/2020 0921 Last data filed at 03/21/2020 0330 Gross per 24 hour  Intake 3371.89 ml  Output --  Net 3371.89 ml    LABS: Basic Metabolic Panel: Recent Labs    03/20/20 0411 03/21/20 0318  NA 136 140  K 4.0 3.7  CL 104 108  CO2 25 25  GLUCOSE 126* 114*  BUN 7* 6*  CREATININE 0.66 0.52  CALCIUM 8.3* 8.2*   Liver Function Tests: Recent Labs    03/19/20 1024 03/21/20 0318  AST 30 52*  ALT 43 74*  ALKPHOS 125 112  BILITOT 1.5* 1.0  PROT 7.1 5.3*  ALBUMIN 3.5 2.5*   No results for input(s): LIPASE, AMYLASE in the last 72 hours. CBC: Recent Labs    03/18/20 1257 03/18/20 1257 03/19/20 1024 03/19/20 1024 03/20/20 0411 03/21/20 0318  WBC 10.3   < > 8.2   < > 4.7 3.8*  NEUTROABS 8.5*  --  7.0  --   --   --   HGB 11.9*   < > 11.4*   < > 9.5* 9.0*  HCT 31.8*   < > 30.8*   < > 25.6* 24.9*  MCV 86.6   < > 89.8   < > 90.5 91.2  PLT 256   < > 191   < > 156 174   < > = values in this interval not displayed.   Cardiac Enzymes: No results for input(s): CKTOTAL, CKMB, CKMBINDEX, TROPONINI in the last 72 hours. BNP: Invalid input(s): POCBNP D-Dimer: No results for input(s): DDIMER in the last 72 hours. Hemoglobin A1C: No results for input(s): HGBA1C in the last 72 hours. Fasting Lipid Panel: No results for input(s): CHOL, HDL, LDLCALC, TRIG, CHOLHDL, LDLDIRECT in the last 72 hours. Thyroid Function Tests: No results for input(s): TSH, T4TOTAL, T3FREE, THYROIDAB in the last 72 hours.  Invalid input(s): FREET3 Anemia Panel: No results for input(s): VITAMINB12, FOLATE, FERRITIN, TIBC, IRON, RETICCTPCT in the last 72  hours.   PHYSICAL EXAM General: Well developed, well nourished, in no acute distress HEENT:  Normocephalic and atramatic Neck:  No JVD.  Lungs: Clear bilaterally to auscultation and percussion. Heart: HRRR . Normal S1 and S2 without gallops or murmurs.  Abdomen: Bowel sounds are positive, abdomen soft and non-tender  Msk:  Back normal, normal gait. Normal strength and tone for age. Extremities: No clubbing, cyanosis or edema.   Neuro: Alert and oriented X 3. Psych:  Good affect, responds appropriately  TELEMETRY: Sinus rhythm  ASSESSMENT AND PLAN: Transesophageal echocardiogram was done without complication patient has normal left ventricular systolic function mild aortic regurgitation mild mitral vegetation trace to mild tricuspid regurgitation no evidence of any vegetation or PFO or ASD with negative bubble study.  Principal Problem:   Bacteremia due to Streptococcus pneumoniae Active Problems:   Primary pancreatic cancer  (HCC)   TIA (transient ischemic attack)   Sepsis ([place])   HLD (hyperlipidemia)   Hyponatremia   UTI (urinary tract infection)    Neoma Laming A, MD, [facility] 03/21/2020 9:21 AM

## 2020-03-21 NOTE — Progress Notes (Signed)
*  PRELIMINARY RESULTS* Echocardiogram Echocardiogram Transesophageal has been performed.  Cassandra [name] 03/21/2020, 8:00 AM

## 2020-03-21 NOTE — Progress Notes (Signed)
Date of Admission:  03/19/2020      ID: Cassandra [name] is a 64 y.o. female Principal Problem:   Bacteremia due to Streptococcus pneumoniae Active Problems:   Primary pancreatic cancer  (HCC)   TIA (transient ischemic attack)   Sepsis ([place])   HLD (hyperlipidemia)   Hyponatremia   UTI (urinary tract infection)    Subjective: Doing well TEE negative  Medications:  . aspirin EC  81 mg Oral Daily  . atorvastatin  40 mg Oral Daily  . butamben-tetracaine-benzocaine      . Chlorhexidine Gluconate Cloth  6 each Topical Daily  . cholecalciferol  2,000 Units Oral Daily  . cholestyramine  4 g Oral BID  . enoxaparin (LOVENOX) injection  40 mg Subcutaneous Q24H  . fentaNYL      . lidocaine      . lidocaine-prilocaine  1 application Topical UD  . midazolam      . multivitamin with minerals  1 tablet Oral Daily  . sodium chloride flush        Objective: Vital signs in last 24 hours: Temp:  [98.4 F (36.9 C)-99 F (37.2 C)] 98.4 F (36.9 C) (09/24 0846) Pulse Rate:  [66-101] 66 (09/24 0846) Resp:  [12-20] 12 (09/24 0825) BP: (108-167)/(70-102) 108/74 (09/24 0846) SpO2:  [97 %-100 %] 98 % (09/24 0846) Weight:  [62.6 kg] 62.6 kg (09/24 0722)  PHYSICAL EXAM:  General: Alert, cooperative, no distress, appears stated age.  Head: Normocephalic, without obvious abnormality, atraumatic. Eyes: Conjunctivae clear, anicteric sclerae. Pupils are equal ENT Nares normal. No drainage or sinus tenderness. Lips, mucosa, and tongue normal. No Thrush Neck: Supple, symmetrical, no adenopathy, thyroid: non tender no carotid bruit and no JVD. Back: No CVA tenderness. Lungs: Clear to auscultation bilaterally. No Wheezing or Rhonchi. No rales. Heart: Regular rate and rhythm, no murmur, rub or gallop. PORT Abdomen: Soft, non-tender,not distended. Bowel sounds normal. No masses Extremities: atraumatic, no cyanosis. No edema. No clubbing Skin: No rashes or lesions. Or bruising Lymph: Cervical,  supraclavicular normal. Neurologic: Grossly non-focal  Lab Results Recent Labs    03/20/20 0411 03/21/20 0318  WBC 4.7 3.8*  HGB 9.5* 9.0*  HCT 25.6* 24.9*  NA 136 140  K 4.0 3.7  CL 104 108  CO2 25 25  BUN 7* 6*  CREATININE 0.66 0.52   Liver Panel Recent Labs    03/19/20 1024 03/21/20 0318  PROT 7.1 5.3*  ALBUMIN 3.5 2.5*  AST 30 52*  ALT 43 74*  ALKPHOS 125 112  BILITOT 1.5* 1.0   Sedimentation Rate No results for input(s): ESRSEDRATE in the last 72 hours. C-Reactive Protein No results for input(s): CRP in the last 72 hours.  Microbiology:  Studies/Results: DG Chest 2 View  Result Date: 03/19/2020 CLINICAL DATA:  Bacteremia, evaluate for pneumonia. EXAM: CHEST - 2 VIEW COMPARISON:  Prior chest CT 03/13/2020. FINDINGS: A right chest infusion port catheter is present with tip terminating in the region of the superior cavoatrial junction. Heart size within normal limits. Aortic atherosclerosis. No appreciable airspace consolidation. No evidence of pleural effusion or pneumothorax. No acute bony abnormality identified. IMPRESSION: No evidence of active cardiopulmonary disease. Aortic Atherosclerosis (ICD10-I70.0). Electronically Signed   By: Kellie Simmering DO   On: 03/19/2020 12:19   ECHO TEE  Result Date: 03/21/2020    TRANSESOPHOGEAL ECHO REPORT   Patient Name:   Cassandra [name] Date of Exam: 03/21/2020 Medical Rec #:  [id number]   Height:  67.0 in Accession #:    [id number]  Weight:       138.0 lb Date of Birth:  September 06, 1955   BSA:          1.727 m Patient Age:    44 years    BP:           133/83 mmHg Patient Gender: F           HR:           83 bpm. Exam Location:  [facility] Procedure: Transesophageal Echo, Color Doppler, Cardiac Doppler and Saline            Contrast Bubble Study Indications:     Not listed on order  History:         Patient has prior history of Echocardiogram examinations, most                  recent 03/12/2020.  Sonographer:     Sherrie Sport RDCS (AE) Referring  Phys:  [place] Diagnosing Phys: Neoma Laming MD PROCEDURE: The transesophogeal probe was passed without difficulty through the esophogus of the patient. Local oropharyngeal anesthetic was provided with Benzocaine spray. Sedation performed by performing physician. The patient's vital signs; including heart rate, blood pressure, and oxygen saturation; remained stable throughout the procedure. The patient developed no complications during the procedure. IMPRESSIONS  1. Left ventricular ejection fraction, by estimation, is 60 to 65%. The left ventricle has normal function. The left ventricle has no regional wall motion abnormalities.  2. Right ventricular systolic function is normal. The right ventricular size is normal.  3. No left atrial/left atrial appendage thrombus was detected.  4. The mitral valve is normal in structure. Mild mitral valve regurgitation. No evidence of mitral stenosis.  5. The aortic valve is abnormal. Aortic valve regurgitation is mild. No aortic stenosis is present.  6. The inferior vena cava is normal in size with greater than 50% respiratory variability, suggesting right atrial pressure of 3 mmHg.  7. Agitated saline contrast bubble study was negative, with no evidence of any interatrial shunt. Conclusion(s)/Recommendation(s): No evidence of vegetation/infective endocarditis on this transesophageal echocardiogram. FINDINGS  Left Ventricle: Left ventricular ejection fraction, by estimation, is 60 to 65%. The left ventricle has normal function. The left ventricle has no regional wall motion abnormalities. The left ventricular internal cavity size was normal in size. There is  no left ventricular hypertrophy. Right Ventricle: The right ventricular size is normal. No increase in right ventricular wall thickness. Right ventricular systolic function is normal. Left Atrium: Left atrial size was normal in size. No left atrial/left atrial appendage thrombus was detected. Right Atrium: Right  atrial size was normal in size. Pericardium: There is no evidence of pericardial effusion. Mitral Valve: The mitral valve is normal in structure. Mild mitral valve regurgitation. No evidence of mitral valve stenosis. Tricuspid Valve: The tricuspid valve is normal in structure. Tricuspid valve regurgitation is mild . No evidence of tricuspid stenosis. Aortic Valve: The aortic valve is abnormal. Aortic valve regurgitation is mild. No aortic stenosis is present. Pulmonic Valve: The pulmonic valve was normal in structure. Pulmonic valve regurgitation is trivial. No evidence of pulmonic stenosis. Aorta: The aortic root is normal in size and structure. Venous: The inferior vena cava is normal in size with greater than 50% respiratory variability, suggesting right atrial pressure of 3 mmHg. IAS/Shunts: No atrial level shunt detected by color flow Doppler. Agitated saline contrast was given intravenously to evaluate for intracardiac shunting.  Agitated saline contrast bubble study was negative, with no evidence of any interatrial shunt. There  is no evidence of a patent foramen ovale. No ventricular septal defect is seen or detected. There is no evidence of an atrial septal defect. Neoma Laming MD Electronically signed by Neoma Laming MD Signature Date/Time: 03/21/2020/8:39:59 AM    Final      Assessment/Plan: Strep pneumo bacteremia. There is no pneumonia. 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

## 2020-03-21 NOTE — Progress Notes (Signed)
Hematology/Oncology Progress Note [facility] [phone] [phone]  Patient Care Team: Doreen Beam, FNP as PCP - General (Family Medicine) Flinchum, Kelby Aline, FNP (Family Medicine) [name], RN as Oncology Nurse Navigator   Name of the patient: Cassandra [name]  [id number]  [birth year]  Date of visit: 03/21/20   INTERVAL HISTORY-  Patient is afebrile.  TEE was done this morning.  No vegetation.  Patient reports feeling well.  No new complaints.    Review of systems- Review of Systems  Constitutional: Negative for appetite change and fever.  Respiratory: Negative for shortness of breath.   Cardiovascular: Negative for chest pain.  Gastrointestinal: Negative for abdominal pain.  Genitourinary: Negative for dysuria.   Musculoskeletal: Negative for arthralgias.  Skin: Negative for rash.  Neurological: Negative for light-headedness.  Psychiatric/Behavioral: Negative for confusion.    Allergies  Allergen Reactions  . Penicillin G Hives and Swelling    Rxn as a child    Patient Active Problem List   Diagnosis Date Noted  . Sepsis ([place]) 03/19/2020  . Bacteremia due to Streptococcus pneumoniae 03/19/2020  . HLD (hyperlipidemia) 03/19/2020  . Hyponatremia 03/19/2020  . UTI (urinary tract infection) 03/19/2020  . Encounter for antineoplastic chemotherapy 03/11/2020  . TIA (transient ischemic attack) 03/11/2020  . Primary pancreatic cancer  ([place]) 02/29/2020  . Family history of cancer 02/29/2020  . Goals of care, counseling/discussion 02/29/2020  . Obstructive jaundice 02/29/2020  . Plantar fasciitis, right 09/19/2019  . Varicose veins of both lower extremities with inflammation 09/19/2019  . Pure hypercholesterolemia 09/17/2018  . Osteopenia after menopause 09/11/2017  . Postmenopausal 05/12/2017     Past Medical History:  Diagnosis Date  . Cancer [facility])    pancreatic cancer  . Colon polyps   . Osteopenia  after menopause 05/2017   femoral neck T score -2.0     Past Surgical History:  Procedure Laterality Date  . COLONOSCOPY  07/2015   WNL  . COLONOSCOPY  2008/2011  . OVARIAN CYST REMOVAL  1992   dermoid-Dr Compton  . PORTA CATH INSERTION N/A 03/07/2020   Procedure: PORTA CATH INSERTION;  Surgeon: Algernon Huxley, MD;  Location: [facility];  Service: Cardiovascular;  Laterality: N/A;  . TUBAL LIGATION  1993    Social History   Socioeconomic History  . Marital status: Married    Spouse name: Not on file  . Number of children: 2  . Years of education: Not on file  . Highest education level: Not on file  Occupational History  . Occupation: Pharmacist, hospital    Comment: retired  . Occupation: Farmer  Tobacco Use  . Smoking status: Former Smoker    Packs/day: 0.50    Years: 10.00    Pack years: 5.00    Types: Cigarettes    Quit date: 1987    Years since quitting: 34.7  . Smokeless tobacco: Never Used  . Tobacco comment: Quit smoking 1987  Vaping Use  . Vaping Use: Never used  Substance and Sexual Activity  . Alcohol use: Not Currently    Comment: 0-2 mixed drinks a day  . Drug use: No  . Sexual activity: Not Currently    Birth control/protection: Post-menopausal  Other Topics Concern  . Not on file  Social History Narrative  . Not on file   Social Determinants of Health   Financial Resource Strain:   . Difficulty of Paying Living Expenses: Not on file  Food Insecurity:   . Worried  About Running Out of Food in the Last Year: Not on file  . Ran Out of Food in the Last Year: Not on file  Transportation Needs:   . Lack of Transportation (Medical): Not on file  . Lack of Transportation (Non-Medical): Not on file  Physical Activity:   . Days of Exercise per Week: Not on file  . Minutes of Exercise per Session: Not on file  Stress:   . Feeling of Stress : Not on file  Social Connections:   . Frequency of Communication with Friends and Family: Not on file  . Frequency of  Social Gatherings with Friends and Family: Not on file  . Attends Religious Services: Not on file  . Active Member of Clubs or Organizations: Not on file  . Attends Archivist Meetings: Not on file  . Marital Status: Not on file  Intimate Partner Violence:   . Fear of Current or Ex-Partner: Not on file  . Emotionally Abused: Not on file  . Physically Abused: Not on file  . Sexually Abused: Not on file     Family History  Problem Relation Age of Onset  . Breast cancer Paternal Aunt 15  . Diabetes Mother   . Osteoporosis Mother   . Hyperlipidemia Father   . Rheumatic fever Father   . Valvular heart disease Father   . Colon cancer Paternal Grandmother   . Breast cancer Sister 49     Current Facility-Administered Medications:  .  0.9 %  sodium chloride infusion, , Intravenous, Continuous, Ivor Costa, MD, Last Rate: 75 mL/hr at 03/21/20 0435, New Bag at 03/21/20 0435 .  acetaminophen (TYLENOL) tablet 650 mg, 650 mg, Oral, Q6H PRN, Ivor Costa, MD, 650 mg at 03/19/20 2318 .  aspirin EC tablet 81 mg, 81 mg, Oral, Daily, Wyvonnia Dusky, MD, 81 mg at 03/21/20 1039 .  atorvastatin (LIPITOR) tablet 40 mg, 40 mg, Oral, Daily, Wyvonnia Dusky, MD, 40 mg at 03/21/20 1039 .  butamben-tetracaine-benzocaine (CETACAINE) 07-30-12 % spray, , , ,  .  cefTRIAXone (ROCEPHIN) 2 g in sodium chloride 0.9 % 100 mL IVPB, 2 g, Intravenous, Q24H, Ivor Costa, MD, Last Rate: 200 mL/hr at 03/21/20 1038, 2 g at 03/21/20 1038 .  Chlorhexidine Gluconate Cloth 2 % PADS 6 each, 6 each, Topical, Daily, Wyvonnia Dusky, MD, 6 each at 03/20/20 1755 .  cholecalciferol (VITAMIN D3) tablet 2,000 Units, 2,000 Units, Oral, Daily, Ivor Costa, MD, 2,000 Units at 03/21/20 1039 .  cholestyramine (QUESTRAN) packet 4 g, 4 g, Oral, BID, Ivor Costa, MD .  enoxaparin (LOVENOX) injection 40 mg, 40 mg, Subcutaneous, Q24H, Ivor Costa, MD, 40 mg at 03/19/20 1318 .  fentaNYL (SUBLIMAZE) 100 MCG/2ML injection, , , ,  .   lidocaine (XYLOCAINE) 2 % viscous mouth solution, , , ,  .  lidocaine-prilocaine (EMLA) cream 1 application, 1 application, Topical, UD, Niu, Xilin, MD .  loperamide (IMODIUM) capsule 2 mg, 2 mg, Oral, PRN, Ivor Costa, MD, 2 mg at 03/19/20 1733 .  midazolam (VERSED) 5 MG/5ML injection, , , ,  .  multivitamin with minerals tablet 1 tablet, 1 tablet, Oral, Daily, Ivor Costa, MD, 1 tablet at 03/21/20 1039 .  ondansetron (ZOFRAN) injection 4 mg, 4 mg, Intravenous, Q8H PRN, Ivor Costa, MD .  simethicone [facility]) chewable tablet 80 mg, 80 mg, Oral, Q8H PRN, Ivor Costa, MD .  sodium chloride flush 0.9 % injection, , , ,    Physical exam:  Vitals:   03/21/20  0800 03/21/20 0815 03/21/20 0825 03/21/20 0846  BP: 121/76 122/75 110/70 108/74  Pulse: 72 71 66 66  Resp: 20 16 12    Temp:    98.4 F (36.9 C)  TempSrc:    Oral  SpO2: 99% 98% 97% 98%  Weight:      Height:       Physical Exam Constitutional:      General: She is not in acute distress.    Appearance: She is not diaphoretic.  HENT:     Head: Normocephalic and atraumatic.     Nose: Nose normal.     Mouth/Throat:     Pharynx: No oropharyngeal exudate.  Eyes:     General: No scleral icterus.    Pupils: Pupils are equal, round, and reactive to light.  Cardiovascular:     Rate and Rhythm: Normal rate and regular rhythm.     Heart sounds: No murmur heard.   Pulmonary:     Effort: Pulmonary effort is normal. No respiratory distress.     Breath sounds: No rales.  Chest:     Chest wall: No tenderness.  Abdominal:     General: There is no distension.     Palpations: Abdomen is soft.     Tenderness: There is no abdominal tenderness.  Musculoskeletal:        General: Normal range of motion.     Cervical back: Normal range of motion and neck supple.  Skin:    General: Skin is warm and dry.     Findings: No erythema.  Neurological:     Mental Status: She is alert and oriented to person, place, and time.     Cranial Nerves: No  cranial nerve deficit.     Motor: No abnormal muscle tone.     Coordination: Coordination normal.  Psychiatric:        Mood and Affect: Affect normal.        CMP Latest Ref Rng & Units 03/21/2020  Glucose 70 - 99 mg/dL 114(H)  BUN 8 - 23 mg/dL 6(L)  Creatinine 0.44 - 1.00 mg/dL 0.52  Sodium 135 - 145 mmol/L 140  Potassium 3.5 - 5.1 mmol/L 3.7  Chloride 98 - 111 mmol/L 108  CO2 22 - 32 mmol/L 25  Calcium 8.9 - 10.3 mg/dL 8.2(L)  Total Protein 6.5 - 8.1 g/dL 5.3(L)  Total Bilirubin 0.3 - 1.2 mg/dL 1.0  Alkaline Phos 38 - 126 U/L 112  AST 15 - 41 U/L 52(H)  ALT 0 - 44 U/L 74(H)   CBC Latest Ref Rng & Units 03/21/2020  WBC 4.0 - 10.5 K/uL 3.8(L)  Hemoglobin 12.0 - 15.0 g/dL 9.0(L)  Hematocrit 36 - 46 % 24.9(L)  Platelets 150 - 400 K/uL 174    RADIOGRAPHIC STUDIES: I have personally reviewed the radiological images as listed and agreed with the findings in the report. CT ANGIO HEAD W OR WO CONTRAST  Result Date: 03/12/2020 CLINICAL DATA:  Slurred speech.  Pancreatic cancer patient. EXAM: CT ANGIOGRAPHY HEAD AND NECK TECHNIQUE: Multidetector CT imaging of the head and neck was performed using the standard protocol during bolus administration of intravenous contrast. Multiplanar CT image reconstructions and MIPs were obtained to evaluate the vascular anatomy. Carotid stenosis measurements (when applicable) are obtained utilizing NASCET criteria, using the distal internal carotid diameter as the denominator. CONTRAST:  12mL OMNIPAQUE IOHEXOL 350 MG/ML SOLN COMPARISON:  Head CT yesterday. FINDINGS: CT HEAD FINDINGS Brain: No evidence of acute infarction. No accelerated brain atrophy. No mass, hemorrhage,  hydrocephalus or extra-axial collection. Vascular: There is atherosclerotic calcification of the major vessels at the base of the brain. Skull: Negative Sinuses: Clear Orbits: Normal Review of the MIP images confirms the above findings CTA NECK FINDINGS Aortic arch: Aortic atherosclerosis.  Branching pattern is normal without origin stenosis. Right carotid system: Common carotid artery widely patent to the bifurcation. Calcified plaque at the carotid bifurcation but no stenosis. ICA bulb widely patent. Cervical ICA is normal. Left carotid system: Common carotid artery widely patent to the bifurcation. Carotid bifurcation is normal without plaque or stenosis. Cervical ICA is normal. Vertebral arteries: Both vertebral arteries widely patent at their origins and through the cervical region to the foramen magnum. Skeleton: Ordinary cervical spondylosis. Other neck: No mass or lymphadenopathy. Upper chest: Normal Review of the MIP images confirms the above findings CTA HEAD FINDINGS Anterior circulation: Both internal carotid arteries widely patent through the skull base and siphon regions. Ordinary siphon atherosclerotic calcification but without stenosis greater than 30%. The anterior and middle cerebral vessels are normal without proximal stenosis, aneurysm or vascular malformation. No large or medium vessel occlusion. Posterior circulation: Both vertebral arteries widely patent to the basilar. No basilar stenosis. Posterior circulation branch vessels are normal. Venous sinuses: Patent and normal. Anatomic variants: None significant. Review of the MIP images confirms the above findings IMPRESSION: 1. Mild atherosclerotic disease at the right carotid bifurcation but without stenosis. Left carotid bifurcation normal. 2. No intracranial large or medium vessel occlusion or correctable proximal stenosis. 3. Aortic atherosclerosis. Aortic Atherosclerosis (ICD10-I70.0). Electronically Signed   By: Nelson Chimes M.D.   On: 03/12/2020 10:46   DG Chest 2 View  Result Date: 03/19/2020 CLINICAL DATA:  Bacteremia, evaluate for pneumonia. EXAM: CHEST - 2 VIEW COMPARISON:  Prior chest CT 03/13/2020. FINDINGS: A right chest infusion port catheter is present with tip terminating in the region of the superior  cavoatrial junction. Heart size within normal limits. Aortic atherosclerosis. No appreciable airspace consolidation. No evidence of pleural effusion or pneumothorax. No acute bony abnormality identified. IMPRESSION: No evidence of active cardiopulmonary disease. Aortic Atherosclerosis (ICD10-I70.0). Electronically Signed   By: Kellie Simmering DO   On: 03/19/2020 12:19   CT HEAD WO CONTRAST  Result Date: 03/11/2020 CLINICAL DATA:  Slurred speech EXAM: CT HEAD WITHOUT CONTRAST TECHNIQUE: Contiguous axial images were obtained from the base of the skull through the vertex without intravenous contrast. COMPARISON:  None. FINDINGS: Brain: No evidence of acute territorial infarction, hemorrhage, hydrocephalus,extra-axial collection or mass lesion/mass effect. Normal gray-white differentiation. Ventricles are normal in size and contour. Vascular: No hyperdense vessel or unexpected calcification. Skull: The skull is intact. No fracture or focal lesion identified. Sinuses/Orbits: The visualized paranasal sinuses and mastoid air cells are clear. The orbits and globes intact. Other: None IMPRESSION: No acute intracranial abnormality. Electronically Signed   By: Prudencio Pair M.D.   On: 03/11/2020 16:48   CT ANGIO NECK W OR WO CONTRAST  Result Date: 03/12/2020 CLINICAL DATA:  Slurred speech.  Pancreatic cancer patient. EXAM: CT ANGIOGRAPHY HEAD AND NECK TECHNIQUE: Multidetector CT imaging of the head and neck was performed using the standard protocol during bolus administration of intravenous contrast. Multiplanar CT image reconstructions and MIPs were obtained to evaluate the vascular anatomy. Carotid stenosis measurements (when applicable) are obtained utilizing NASCET criteria, using the distal internal carotid diameter as the denominator. CONTRAST:  67mL OMNIPAQUE IOHEXOL 350 MG/ML SOLN COMPARISON:  Head CT yesterday. FINDINGS: CT HEAD FINDINGS Brain: No evidence of acute infarction. No accelerated brain  atrophy. No  mass, hemorrhage, hydrocephalus or extra-axial collection. Vascular: There is atherosclerotic calcification of the major vessels at the base of the brain. Skull: Negative Sinuses: Clear Orbits: Normal Review of the MIP images confirms the above findings CTA NECK FINDINGS Aortic arch: Aortic atherosclerosis. Branching pattern is normal without origin stenosis. Right carotid system: Common carotid artery widely patent to the bifurcation. Calcified plaque at the carotid bifurcation but no stenosis. ICA bulb widely patent. Cervical ICA is normal. Left carotid system: Common carotid artery widely patent to the bifurcation. Carotid bifurcation is normal without plaque or stenosis. Cervical ICA is normal. Vertebral arteries: Both vertebral arteries widely patent at their origins and through the cervical region to the foramen magnum. Skeleton: Ordinary cervical spondylosis. Other neck: No mass or lymphadenopathy. Upper chest: Normal Review of the MIP images confirms the above findings CTA HEAD FINDINGS Anterior circulation: Both internal carotid arteries widely patent through the skull base and siphon regions. Ordinary siphon atherosclerotic calcification but without stenosis greater than 30%. The anterior and middle cerebral vessels are normal without proximal stenosis, aneurysm or vascular malformation. No large or medium vessel occlusion. Posterior circulation: Both vertebral arteries widely patent to the basilar. No basilar stenosis. Posterior circulation branch vessels are normal. Venous sinuses: Patent and normal. Anatomic variants: None significant. Review of the MIP images confirms the above findings IMPRESSION: 1. Mild atherosclerotic disease at the right carotid bifurcation but without stenosis. Left carotid bifurcation normal. 2. No intracranial large or medium vessel occlusion or correctable proximal stenosis. 3. Aortic atherosclerosis. Aortic Atherosclerosis (ICD10-I70.0). Electronically Signed   By: Nelson Chimes M.D.   On: 03/12/2020 10:46   CT CHEST W CONTRAST  Result Date: 03/13/2020 CLINICAL DATA:  64 year old female with history of pancreatic cancer. EXAM: CT CHEST WITH CONTRAST TECHNIQUE: Multidetector CT imaging of the chest was performed during intravenous contrast administration. CONTRAST:  41mL OMNIPAQUE IOHEXOL 300 MG/ML  SOLN COMPARISON:  No priors. FINDINGS: Cardiovascular: Heart size is normal. There is no significant pericardial fluid, thickening or pericardial calcification. 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 No definite coronary artery calcifications. Right internal jugular single-lumen porta cath with tip terminating in the superior aspect of the right atrium. Mediastinum/Nodes: No pathologically enlarged mediastinal or hilar lymph nodes. Esophagus is unremarkable in appearance. No axillary lymphadenopathy. Lungs/Pleura: No suspicious appearing pulmonary nodules or masses are noted. No acute consolidative airspace disease. No pleural effusions. Upper Abdomen: Extensive pneumobilia. Musculoskeletal: There are no aggressive appearing lytic or blastic lesions noted in the visualized portions of the skeleton. IMPRESSION: 1. No findings to suggest metastatic disease to the thorax. 2. 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 Recommend annual imaging followup by CTA or MRA. This recommendation follows 2010 ACCF/AHA/AATS/ACR/ASA/SCA/SCAI/SIR/STS/SVM Guidelines for the Diagnosis and Management of Patients with Thoracic Aortic Disease. Circulation. 2010; 121: B716-R678. Aortic aneurysm NOS (ICD10-I71.9). Aortic Atherosclerosis (ICD10-I70.0). Electronically Signed   By: Vinnie Langton M.D.   On: 03/13/2020 11:59   MR BRAIN WO CONTRAST  Result Date: 03/12/2020 CLINICAL DATA:  Transit ischemic attack.  Slurred speech. EXAM: MRI HEAD WITHOUT CONTRAST TECHNIQUE: Multiplanar, multiecho pulse sequences of  the brain and surrounding structures were obtained without intravenous contrast. COMPARISON:  03/11/2020 head CT.  03/12/2020 CTA head and neck. FINDINGS: Brain: No acute infarct or intracranial hemorrhage. Cerebral volume is within normal limits. Minimal chronic microvascular ischemic changes. No midline shift, ventriculomegaly or extra-axial fluid collection. No mass lesion. Vascular: Please see  same day CTA. Skull and upper cervical spine: Normal marrow signal. Sinuses/Orbits: Normal orbits. Clear paranasal sinuses. No mastoid effusion. Other: None. IMPRESSION: No acute intracranial process. Minimal chronic microvascular ischemic changes. Electronically Signed   By: Primitivo Gauze M.D.   On: 03/12/2020 11:49   US Carotid Bilateral (at [facility] and [facility] only)  Result Date: 03/12/2020 CLINICAL DATA:  64 year old female with a history of TIA EXAM: BILATERAL CAROTID DUPLEX ULTRASOUND TECHNIQUE: Pearline Cables scale imaging, color Doppler and duplex ultrasound were performed of bilateral carotid and vertebral arteries in the neck. COMPARISON:  None. FINDINGS: Criteria: Quantification of carotid stenosis is based on velocity parameters that correlate the residual internal carotid diameter with NASCET-based stenosis levels, using the diameter of the distal internal carotid lumen as the denominator for stenosis measurement. The following velocity measurements were obtained: RIGHT ICA:  Systolic 433 cm/sec, Diastolic 52 cm/sec CCA:  98 cm/sec SYSTOLIC ICA/CCA RATIO:  1.6 ECA:  170 cm/sec LEFT ICA:  Systolic 89 cm/sec, Diastolic 14 cm/sec CCA:  95 cm/sec SYSTOLIC ICA/CCA RATIO:  0.9 ECA:  119 cm/sec Right Brachial SBP: Not acquired Left Brachial SBP: Not acquired RIGHT CAROTID ARTERY: No significant calcifications of the right common carotid artery. Intermediate waveform maintained. Heterogeneous and partially calcified plaque at the right carotid bifurcation. No significant lumen shadowing. Low resistance waveform of the right  ICA. No significant tortuosity. RIGHT VERTEBRAL ARTERY: Antegrade flow with low resistance waveform. LEFT CAROTID ARTERY: No significant calcifications of the left common carotid artery. Intermediate waveform maintained. Heterogeneous and partially calcified plaque at the left carotid bifurcation without significant lumen shadowing. Low resistance waveform of the left ICA. No significant tortuosity. LEFT VERTEBRAL ARTERY:  Antegrade flow with low resistance waveform. IMPRESSION: Right: Heterogeneous and partially calcified plaque at the right carotid bifurcation, with discordant results regarding degree of stenosis by established duplex criteria. Peak velocity suggests 50%-69% stenosis, with the ICA/ CCA ratio suggesting a lesser degree of stenosis. If establishing a more accurate degree of stenosis is required, cerebral angiogram should be considered, or as a second best test, CTA. Left: Color duplex indicates minimal heterogeneous and calcified plaque, with no hemodynamically significant stenosis by duplex criteria in the extracranial cerebrovascular circulation. Signed, Dulcy Fanny. Dellia Nims, RPVI [facility] Electronically Signed   By: Corrie Mckusick D.O.   On: 03/12/2020 14:02   PERIPHERAL VASCULAR CATHETERIZATION  Result Date: 03/07/2020 See op note  ECHOCARDIOGRAM COMPLETE  Result Date: 03/12/2020    ECHOCARDIOGRAM REPORT   Patient Name:   HILIANA EILTS Date of Exam: 03/12/2020 Medical Rec #:  [id number]   Height:       67.0 in Accession #:    [id number]  Weight:       145.3 lb Date of Birth:  12/02/1955   BSA:          1.765 m Patient Age:    41 years    BP:           125/74 mmHg Patient Gender: F           HR:           63 bpm. Exam Location:  [facility] Procedure: 2D Echo, Cardiac Doppler and Color Doppler Indications:     TIA 435.9  History:         Patient has no prior history of Echocardiogram examinations. No                  cardiac hsitory listed.  Sonographer:     Sherrie Sport RDCS (AE) Referring Phys:  [id number] Sidney Ace Diagnosing Phys: Kate Sable MD IMPRESSIONS  1. Left ventricular ejection fraction, by estimation, is 65 to 70%. The left ventricle has normal function. The left ventricle has no regional wall motion abnormalities. Left ventricular diastolic parameters were normal.  2. Right ventricular systolic function is normal. The right ventricular size is normal. There is normal pulmonary artery systolic pressure.  3. The mitral valve is normal in structure. No evidence of mitral valve regurgitation. No evidence of mitral stenosis.  4. The aortic valve is normal in structure. Aortic valve regurgitation is not visualized. No aortic stenosis is present.  5. The inferior vena cava is normal in size with greater than 50% respiratory variability, suggesting right atrial pressure of 3 mmHg. FINDINGS  Left Ventricle: Left ventricular ejection fraction, by estimation, is 65 to 70%. The left ventricle has normal function. The left ventricle has no regional wall motion abnormalities. The left ventricular internal cavity size was normal in size. There is  no left ventricular hypertrophy. Left ventricular diastolic parameters were normal. Right Ventricle: The right ventricular size is normal. No increase in right ventricular wall thickness. Right ventricular systolic function is normal. There is normal pulmonary artery systolic pressure. The tricuspid regurgitant velocity is 2.57 m/s, and  with an assumed right atrial pressure of 3 mmHg, the estimated right ventricular systolic pressure is 81.8 mmHg. Left Atrium: Left atrial size was normal in size. Right Atrium: Right atrial size was normal in size. Pericardium: There is no evidence of pericardial effusion. Mitral Valve: The mitral valve is normal in structure. No evidence of mitral valve regurgitation. No evidence of mitral valve stenosis. Tricuspid Valve: The tricuspid valve is normal in structure.  Tricuspid valve regurgitation is not demonstrated. No evidence of tricuspid stenosis. Aortic Valve: The aortic valve is normal in structure. Aortic valve regurgitation is not visualized. No aortic stenosis is present. Aortic valve mean gradient measures 4.5 mmHg. Aortic valve peak gradient measures 7.3 mmHg. Aortic valve area, by VTI measures 3.23 cm. Pulmonic Valve: The pulmonic valve was normal in structure. Pulmonic valve regurgitation is not visualized. No evidence of pulmonic stenosis. Aorta: The aortic root is normal in size and structure. Venous: The inferior vena cava is normal in size with greater than 50% respiratory variability, suggesting right atrial pressure of 3 mmHg. IAS/Shunts: No atrial level shunt detected by color flow Doppler.  LEFT VENTRICLE PLAX 2D LVIDd:         4.00 cm  Diastology LVIDs:         2.28 cm  LV e' medial:    7.72 cm/s LV PW:         1.50 cm  LV E/e' medial:  10.8 LV IVS:        0.90 cm  LV e' lateral:   11.50 cm/s LVOT diam:     2.00 cm  LV E/e' lateral: 7.3 LV SV:         96 LV SV Index:   54 LVOT Area:     3.14 cm  RIGHT VENTRICLE RV Basal diam:  3.77 cm RV S prime:     19.00 cm/s TAPSE (M-mode): 3.7 cm LEFT ATRIUM             Index       RIGHT ATRIUM           Index LA diam:        2.30 cm 1.30 cm/m  RA Area:     16.70 cm LA Vol (A2C):   45.2 ml 25.60 ml/m RA Volume:   45.40 ml  25.72 ml/m LA Vol (A4C):   35.1 ml 19.88 ml/m LA Biplane Vol: 39.1 ml 22.15 ml/m  AORTIC VALVE                   PULMONIC VALVE AV Area (Vmax):    3.22 cm    PV Vmax:        0.79 m/s AV Area (Vmean):   3.07 cm    PV Peak grad:   2.5 mmHg AV Area (VTI):     3.23 cm    RVOT Peak grad: 4 mmHg AV Vmax:           135.50 cm/s AV Vmean:          99.250 cm/s AV VTI:            0.297 m AV Peak Grad:      7.3 mmHg AV Mean Grad:      4.5 mmHg LVOT Vmax:         139.00 cm/s LVOT Vmean:        97.100 cm/s LVOT VTI:          0.305 m LVOT/AV VTI ratio: 1.03  AORTA Ao Root diam: 3.50 cm MITRAL VALVE                TRICUSPID VALVE MV Area (PHT): 2.39 cm    TR Peak grad:   26.4 mmHg MV Decel Time: 317 msec    TR Vmax:        257.00 cm/s MV E velocity: 83.40 cm/s MV A velocity: 72.30 cm/s  SHUNTS MV E/A ratio:  1.15        Systemic VTI:  0.30 m                            Systemic Diam: 2.00 cm Kate Sable MD Electronically signed by Kate Sable MD Signature Date/Time: 03/12/2020/4:01:51 PM    Final    ECHO TEE  Result Date: 03/21/2020    TRANSESOPHOGEAL ECHO REPORT   Patient Name:   ERETRIA MANTERNACH Date of Exam: 03/21/2020 Medical Rec #:  [id number]   Height:       67.0 in Accession #:    [id number]  Weight:       138.0 lb Date of Birth:  11-May-1956   BSA:          1.727 m Patient Age:    64 years    BP:           133/83 mmHg Patient Gender: F           HR:           83 bpm. Exam Location:  [facility] Procedure: Transesophageal Echo, Color Doppler, Cardiac Doppler and Saline            Contrast Bubble Study Indications:     Not listed on order  History:         Patient has prior history of Echocardiogram examinations, most                  recent 03/12/2020.  Sonographer:     Sherrie Sport RDCS (AE) Referring Phys:  [place] Diagnosing Phys: Neoma Laming MD PROCEDURE: The transesophogeal probe was passed without difficulty through the esophogus of the patient. Local oropharyngeal anesthetic  was provided with Benzocaine spray. Sedation performed by performing physician. The patient's vital signs; including heart rate, blood pressure, and oxygen saturation; remained stable throughout the procedure. The patient developed no complications during the procedure. IMPRESSIONS  1. Left ventricular ejection fraction, by estimation, is 60 to 65%. The left ventricle has normal function. The left ventricle has no regional wall motion abnormalities.  2. Right ventricular systolic function is normal. The right ventricular size is normal.  3. No left atrial/left atrial appendage thrombus was detected.  4. The mitral valve is  normal in structure. Mild mitral valve regurgitation. No evidence of mitral stenosis.  5. The aortic valve is abnormal. Aortic valve regurgitation is mild. No aortic stenosis is present.  6. The inferior vena cava is normal in size with greater than 50% respiratory variability, suggesting right atrial pressure of 3 mmHg.  7. Agitated saline contrast bubble study was negative, with no evidence of any interatrial shunt. Conclusion(s)/Recommendation(s): No evidence of vegetation/infective endocarditis on this transesophageal echocardiogram. FINDINGS  Left Ventricle: Left ventricular ejection fraction, by estimation, is 60 to 65%. The left ventricle has normal function. The left ventricle has no regional wall motion abnormalities. The left ventricular internal cavity size was normal in size. There is  no left ventricular hypertrophy. Right Ventricle: The right ventricular size is normal. No increase in right ventricular wall thickness. Right ventricular systolic function is normal. Left Atrium: Left atrial size was normal in size. No left atrial/left atrial appendage thrombus was detected. Right Atrium: Right atrial size was normal in size. Pericardium: There is no evidence of pericardial effusion. Mitral Valve: The mitral valve is normal in structure. Mild mitral valve regurgitation. No evidence of mitral valve stenosis. Tricuspid Valve: The tricuspid valve is normal in structure. Tricuspid valve regurgitation is mild . No evidence of tricuspid stenosis. Aortic Valve: The aortic valve is abnormal. Aortic valve regurgitation is mild. No aortic stenosis is present. Pulmonic Valve: The pulmonic valve was normal in structure. Pulmonic valve regurgitation is trivial. No evidence of pulmonic stenosis. Aorta: The aortic root is normal in size and structure. Venous: The inferior vena cava is normal in size with greater than 50% respiratory variability, suggesting right atrial pressure of 3 mmHg. IAS/Shunts: No atrial level  shunt detected by color flow Doppler. Agitated saline contrast was given intravenously to evaluate for intracardiac shunting. Agitated saline contrast bubble study was negative, with no evidence of any interatrial shunt. There  is no evidence of a patent foramen ovale. No ventricular septal defect is seen or detected. There is no evidence of an atrial septal defect. Neoma Laming MD Electronically signed by Neoma Laming MD Signature Date/Time: 03/21/2020/8:39:59 AM    Final     Assessment and plan-  #Strep pneumonia sepsis TEE negative for vegetation. ID recommendation was reviewed.  Recommend ceftriaxone via medi port until 04/02/2020  #Stage IV pancreatic cancer I will postpone cycle 2 chemotherapy until she finishes antibiotics course.  #Obstructive jaundice, bilirubin has been stable. #Anemia, hemoglobin 9.  Continue to monitor. Patient will follow up with me outpatient.  Our office staff will call her to update her about the changed appointment information. Thank you for allowing me to participate in the care of this patient.   [name], MD, PhD Hematology Oncology [facility] at [facility] Pager- [phone] 03/21/2020

## 2020-03-21 NOTE — Progress Notes (Signed)
PHARMACY CONSULT NOTE FOR:  OUTPATIENT  PARENTERAL ANTIBIOTIC THERAPY (OPAT)  Indication: pneumococcal bacteremia Regimen: ceftriaxone 2gm IV q24h End date: 04/02/2020  IV antibiotic discharge orders are pended. To discharging provider:  please sign these orders via discharge navigator,  Select New Orders & click on the button choice - Manage This Unsigned Work.     Thank you for allowing pharmacy to be a part of this patient's care.  Doreene Eland, PharmD, BCPS.   Work Cell: [phone] 03/21/2020 9:29 AM

## 2020-03-21 NOTE — Progress Notes (Signed)
Patient discharged home with belongings and discharge instructions via w/c to personal vehicle with her husband.

## 2020-03-21 NOTE — Plan of Care (Signed)

## 2020-03-22 LAB — URINE CULTURE: Culture: NO GROWTH

## 2020-03-23 NOTE — Progress Notes (Signed)
No growth in two days on blood cultures. Negative so far.

## 2020-03-24 ENCOUNTER — Encounter: Payer: Self-pay | Admitting: Cardiovascular Disease

## 2020-03-25 ENCOUNTER — Ambulatory Visit: Payer: BC Managed Care – PPO

## 2020-03-25 ENCOUNTER — Ambulatory Visit: Payer: BC Managed Care – PPO | Admitting: Oncology

## 2020-03-25 ENCOUNTER — Other Ambulatory Visit: Payer: BC Managed Care – PPO

## 2020-03-26 LAB — CULTURE, BLOOD (ROUTINE X 2)
Culture: NO GROWTH
Culture: NO GROWTH

## 2020-03-28 ENCOUNTER — Ambulatory Visit: Payer: BC Managed Care – PPO

## 2020-03-28 ENCOUNTER — Other Ambulatory Visit: Payer: BC Managed Care – PPO

## 2020-03-28 ENCOUNTER — Ambulatory Visit: Payer: BC Managed Care – PPO | Admitting: Oncology

## 2020-03-31 ENCOUNTER — Other Ambulatory Visit: Payer: Self-pay

## 2020-03-31 ENCOUNTER — Inpatient Hospital Stay: Payer: BC Managed Care – PPO

## 2020-03-31 ENCOUNTER — Encounter: Payer: Self-pay | Admitting: Licensed Clinical Social Worker

## 2020-03-31 ENCOUNTER — Inpatient Hospital Stay: Payer: BC Managed Care – PPO | Attending: Oncology | Admitting: Licensed Clinical Social Worker

## 2020-03-31 DIAGNOSIS — C259 Malignant neoplasm of pancreas, unspecified: Secondary | ICD-10-CM | POA: Diagnosis not present

## 2020-03-31 DIAGNOSIS — C250 Malignant neoplasm of head of pancreas: Secondary | ICD-10-CM | POA: Insufficient documentation

## 2020-03-31 DIAGNOSIS — C787 Secondary malignant neoplasm of liver and intrahepatic bile duct: Secondary | ICD-10-CM | POA: Insufficient documentation

## 2020-03-31 DIAGNOSIS — D701 Agranulocytosis secondary to cancer chemotherapy: Secondary | ICD-10-CM | POA: Insufficient documentation

## 2020-03-31 DIAGNOSIS — Z5111 Encounter for antineoplastic chemotherapy: Secondary | ICD-10-CM | POA: Insufficient documentation

## 2020-03-31 DIAGNOSIS — Z803 Family history of malignant neoplasm of breast: Secondary | ICD-10-CM | POA: Diagnosis not present

## 2020-03-31 DIAGNOSIS — Z87891 Personal history of nicotine dependence: Secondary | ICD-10-CM | POA: Insufficient documentation

## 2020-03-31 NOTE — Progress Notes (Signed)
REFERRING PROVIDER: [name], MD [place],  [place] [address]  PRIMARY PROVIDER:  Doreen Beam, FNP  PRIMARY REASON FOR VISIT:  1. Primary pancreatic cancer (Cassandra [name])   2. Family history of breast cancer      HISTORY OF PRESENT ILLNESS:   [name], a 64 y.o. female, was seen for a [place] cancer genetics consultation at the request of Dr. Tasia Catchings due to a personal and family history of cancer.  [name] presents to clinic today to discuss the possibility of a hereditary predisposition to cancer, genetic testing, and to further clarify her future cancer risks, as well as potential cancer risks for family members.   In 2021, at the age of 36, [name] was diagnosed with pancreatic cancer. The treatment plan includes chemotherapy.  CANCER HISTORY:  Oncology History  Primary pancreatic cancer ([place])  02/29/2020 Initial Diagnosis   Primary pancreatic cancer  ([place])   02/29/2020 Cancer Staging   Staging form: Exocrine Pancreas, AJCC 8th Edition - Clinical stage from 02/29/2020: Stage IV (cT2, cN0, cM1) - Signed by [name], MD on 02/29/2020   03/11/2020 -  Chemotherapy   The patient had palonosetron (ALOXI) injection 0.25 mg, 0.25 mg, Intravenous,  Once, 1 of 4 cycles Administration: 0.25 mg (03/11/2020) irinotecan (CAMPTOSAR) 260 mg in sodium chloride 0.9 % 500 mL chemo infusion, 150 mg/m2 = 260 mg, Intravenous,  Once, 1 of 4 cycles Administration: 260 mg (03/11/2020) oxaliplatin (ELOXATIN) 150 mg in dextrose 5 % 500 mL chemo infusion, 85 mg/m2 = 150 mg, Intravenous,  Once, 1 of 4 cycles Administration: 150 mg (03/11/2020) fosaprepitant (EMEND) 150 mg in sodium chloride 0.9 % 145 mL IVPB, 150 mg, Intravenous,  Once, 1 of 4 cycles Administration: 150 mg (03/11/2020) fluorouracil (ADRUCIL) 4,200 mg in sodium chloride 0.9 % 66 mL chemo infusion, 2,400 mg/m2 = 4,200 mg, Intravenous, 1 Day/Dose, 1 of 4 cycles Administration: 4,200 mg (03/14/2020) leucovorin 700 mg in sodium chloride  0.9 % 250 mL infusion, 704 mg, Intravenous,  Once, 1 of 4 cycles Administration: 700 mg (03/11/2020)  for chemotherapy treatment.       RISK FACTORS:  Menarche was at age 64.  Ovaries intact: yes.  Hysterectomy: no.  Menopausal status: postmenopausal.  HRT use: 0 years. Colonoscopy: yes; reports 1 polyp. Mammogram within the last year: yes. Number of breast biopsies: 0. Up to date with pelvic exams: yes. Any excessive radiation exposure in the past: no  Past Medical History:  Diagnosis Date  . Cancer [facility])    pancreatic cancer  . Colon polyps   . Family history of breast cancer   . Osteopenia after menopause 05/2017   femoral neck T score -2.0    Past Surgical History:  Procedure Laterality Date  . COLONOSCOPY  07/2015   WNL  . COLONOSCOPY  2008/2011  . OVARIAN CYST REMOVAL  1992   dermoid-Dr [place]  . PORTA CATH INSERTION N/A 03/07/2020   Procedure: PORTA CATH INSERTION;  Surgeon: Algernon Huxley, MD;  Location: [facility];  Service: Cardiovascular;  Laterality: N/A;  . TEE WITHOUT CARDIOVERSION N/A 03/21/2020   Procedure: TRANSESOPHAGEAL ECHOCARDIOGRAM (TEE);  Surgeon: Dionisio David, MD;  Location: [facility] ORS;  Service: Cardiovascular;  Laterality: N/A;  . [place] History   Socioeconomic History  . Marital status: Married    Spouse name: Not on file  . Number of children: 2  . Years of education: Not on file  .  Highest education level: Not on file  Occupational History  . Occupation: Pharmacist, hospital    Comment: retired  . Occupation: Farmer  Tobacco Use  . Smoking status: Former Smoker    Packs/day: 0.50    Years: 10.00    Pack years: 5.00    Types: Cigarettes    Quit date: 1987    Years since quitting: 34.7  . Smokeless tobacco: Never Used  . Tobacco comment: Quit smoking 1987  Vaping Use  . Vaping Use: Never used  Substance and Sexual Activity  . Alcohol use: Not Currently    Comment: 0-2 mixed drinks a day  . Drug use: No  .  Sexual activity: Not Currently    Birth control/protection: Post-menopausal  Other Topics Concern  . Not on file  Social History Narrative  . Not on file   Social Determinants of Health   Financial Resource Strain:   . Difficulty of Paying Living Expenses: Not on file  Food Insecurity:   . Worried About Charity fundraiser in the Last Year: Not on file  . Ran Out of Food in the Last Year: Not on file  Transportation Needs:   . Lack of Transportation (Medical): Not on file  . Lack of Transportation (Non-Medical): Not on file  Physical Activity:   . Days of Exercise per Week: Not on file  . Minutes of Exercise per Session: Not on file  Stress:   . Feeling of Stress : Not on file  Social Connections:   . Frequency of Communication with Friends and Family: Not on file  . Frequency of Social Gatherings with Friends and Family: Not on file  . Attends Religious Services: Not on file  . Active Member of Clubs or Organizations: Not on file  . Attends Archivist Meetings: Not on file  . Marital Status: Not on file     FAMILY HISTORY:  We obtained a detailed, 4-generation family history.  Significant diagnoses are listed below: Family History  Problem Relation Age of Onset  . Breast cancer Paternal Aunt 62  . Diabetes Mother   . Osteoporosis Mother   . Hyperlipidemia Father   . Rheumatic fever Father   . Valvular heart disease Father   . Cancer Paternal Grandmother        possible colon  . Breast cancer Sister 15   [name] has a son, age 88 and daughter, 71. Her daughter is currently pregnant and due in November. Of note, [name]' husband's mother had pancreatic cancer as well.  [name] has 1 sister, Jenny Cassandra [name], who recently had breast cancer at 75 and was treated here, no genetic testing.   [name] mother died at 23, no cancer history. Patient has 1 maternal aunt, no cancers. No known cancers in maternal cousins. Maternal grandmother died at 57, grandfather died at  22.  [name] father died at 75, no cancers. Patient had 2 paternal aunts. One aunt had breast cancer at 72 and died in her 95s. No information about her other aunt. No known cancers in paternal cousins. Paternal grandfather died in his mid 30s. Grandmother had cancer, suspected colon, died in her 72s.   Ms. Yuille is unaware of previous family history of genetic testing for hereditary cancer risks. Patient's maternal ancestors are of [place] and [place] descent, and paternal ancestors are of [place] descent. There is no reported Ashkenazi Jewish ancestry. There is no known consanguinity.    GENETIC COUNSELING ASSESSMENT: Ms. Deyoe is a 64  y.o. female with a personal history of pancreatic cancer and family history of breast cancer which is somewhat suggestive of a hereditary cancer syndrome and predisposition to cancer. We, therefore, discussed and recommended the following at today's visit.   DISCUSSION: We discussed that approximately 5-10% of pancreatic cancer is hereditary  Most cases of hereditary pancreatic cancer are associated with BRCA1/BRCA2 genes, although there are other genes associated with hereditary pancreatic and breast cancer as well. We discussed that testing is beneficial for several reasons including knowing about other cancer risks, identifying potential screening and risk-reduction options that may be appropriate, and to understand if other family members could be at risk for cancer and allow them to undergo genetic testing.   We reviewed the characteristics, features and inheritance patterns of hereditary cancer syndromes. We also discussed genetic testing, including the appropriate family members to test, the process of testing, insurance coverage and turn-around-time for results. We discussed the implications of a negative, positive and/or variant of uncertain significant result. We recommended Ms. Johnsen pursue genetic testing for the [facility] Multi-Cancer gene panel.   The  Multi-Cancer Panel offered by [facility] includes sequencing and/or deletion duplication testing of the following 85 genes: AIP, ALK, APC, ATM, AXIN2,BAP1,  BARD1, BLM, BMPR1A, BRCA1, BRCA2, BRIP1, CASR, CDC73, CDH1, CDK4, CDKN1B, CDKN1C, CDKN2A (p14ARF), CDKN2A (p16INK4a), CEBPA, CHEK2, CTNNA1, DICER1, DIS3L2, EGFR (c.2369C>T, p.Thr790Met variant only), EPCAM (Deletion/duplication testing only), FH, FLCN, GATA2, GPC3, GREM1 (Promoter region deletion/duplication testing only), HOXB13 (c.251G>A, p.Gly84Glu), HRAS, KIT, MAX, MEN1, MET, MITF (c.952G>A, p.Glu318Lys variant only), MLH1, MSH2, MSH3, MSH6, MUTYH, NBN, NF1, NF2, NTHL1, PALB2, PDGFRA, PHOX2B, PMS2, POLD1, POLE, POT1, PRKAR1A, PTCH1, PTEN, RAD50, RAD51C, RAD51D, RB1, RECQL4, RET, RNF43, RUNX1, SDHAF2, SDHA (sequence changes only), SDHB, SDHC, SDHD, SMAD4, SMARCA4, SMARCB1, SMARCE1, STK11, SUFU, TERC, TERT, TMEM127, TP53, TSC1, TSC2, VHL, WRN and WT1.   Based on Ms. Bjelland's personal and family history of cancer, she meets medical criteria for genetic testing. Despite that she meets criteria, she may still have an out of pocket cost.   PLAN: After considering the risks, benefits, and limitations, Ms. Panameno provided informed consent to pursue genetic testing and the blood sample was sent to [facility] for analysis of the Multi-Cancer Panel. Results should be available within approximately 2-3 weeks' time, at which point they will be disclosed by telephone to Ms. Ranker, as will any additional recommendations warranted by these results. Ms. Sullivant will receive a summary of her genetic counseling visit and a copy of her results once available. This information will also be available in Epic.   Ms. Abed questions were answered to her satisfaction today. Our contact information was provided should additional questions or concerns arise. Thank you for the referral and allowing [place] to share in the care of your patient.   Faith Rogue, MS, [facility] Genetic  Counselor [email or web address] Phone: [phone]  The patient was seen for a total of 30 minutes in face-to-face genetic counseling.  Dr. Grayland Ormond was available for discussion regarding this case.   _______________________________________________________________________ For Office Staff:  Number of people involved in session: 1 Was an Intern/ student involved with case: no

## 2020-04-01 ENCOUNTER — Telehealth: Payer: Self-pay | Admitting: Infectious Diseases

## 2020-04-01 NOTE — Telephone Encounter (Signed)
Advised abx end 04/02/2020 as long as meds are finished this is fine to combine lab and pull picc.

## 2020-04-01 NOTE — Telephone Encounter (Signed)
Pt's home health nurse called and stated that the pt is requested to have the needle removed and the blood work done in 1 visit instead of have two sperate visits. She states she is a patient of Dr. Sharla Kidney and would like a call back.

## 2020-04-03 ENCOUNTER — Other Ambulatory Visit: Payer: Self-pay | Admitting: Adult Health

## 2020-04-03 DIAGNOSIS — E7800 Pure hypercholesterolemia, unspecified: Secondary | ICD-10-CM

## 2020-04-03 NOTE — Telephone Encounter (Signed)
Requested medication (s) are due for refill today: yes  Requested medication (s) are on the active medication list:  yes  Future visit scheduled:no  Notes to clinic:  medication filled by a different provider Review for refill   Requested Prescriptions  Pending Prescriptions Disp Refills   atorvastatin (LIPITOR) 40 MG tablet 30 tablet 0    Sig: Take 1 tablet (40 mg total) by mouth daily.      Cardiovascular:  Antilipid - Statins Failed - 04/03/2020  4:56 PM      Failed - LDL in normal range and within 360 days    LDL Chol Calc (NIH)  Date Value Ref Range Status  11/12/2019 151 (H) 0 - 99 mg/dL Final   LDL Cholesterol  Date Value Ref Range Status  03/12/2020 121 (H) 0 - 99 mg/dL Final    Comment:           Total Cholesterol/HDL:CHD Risk Coronary Heart Disease Risk Table                     Men   Women  1/2 Average Risk   3.4   3.3  Average Risk       5.0   4.4  2 X Average Risk   9.6   7.1  3 X Average Risk  23.4   11.0        Use the calculated Patient Ratio above and the CHD Risk Table to determine the patient's CHD Risk.        ATP III CLASSIFICATION (LDL):  <100     mg/dL   Optimal  100-129  mg/dL   Near or Above                    Optimal  130-159  mg/dL   Borderline  160-189  mg/dL   High  >190     mg/dL   Very High Performed at [facility], [address] [place], [place] [address]           Passed - Total Cholesterol in normal range and within 360 days    Cholesterol, Total  Date Value Ref Range Status  11/12/2019 235 (H) 100 - 199 mg/dL Final   Cholesterol  Date Value Ref Range Status  03/12/2020 187 0 - 200 mg/dL Final          Passed - HDL in normal range and within 360 days    HDL  Date Value Ref Range Status  03/12/2020 51 >40 mg/dL Final  11/12/2019 59 >39 mg/dL Final          Passed - Triglycerides in normal range and within 360 days    Triglycerides  Date Value Ref Range Status  03/12/2020 77 <150 mg/dL Final           Passed - Patient is not pregnant      Passed - Valid encounter within last 12 months    Recent Outpatient Visits           1 month ago [place], [name], MD   3 months ago Lactose intolerance   [place], Adriana M, PA-C   5 months ago Pure hypercholesterolemia   [place], Kelby Aline, FNP

## 2020-04-03 NOTE — Telephone Encounter (Signed)
Medication: atorvastatin (LIPITOR) 40 MG tablet [[id number] Orginally Prescribed by the cancer center. Oncology instructed the patient to get PCP to refill  Has the patient contacted their pharmacy? YES (Agent: If no, request that the patient contact the pharmacy for the refill.) (Agent: If yes, when and what did the pharmacy advise?)  Preferred Pharmacy (with phone number or street name): [id number] [address] [place] [address] Phone: [phone] Fax: [phone] Hours: Not open 24 hours    Agent: Please be advised that RX refills may take up to 3 business days. We ask that you follow-up with your pharmacy.

## 2020-04-04 ENCOUNTER — Inpatient Hospital Stay: Payer: BC Managed Care – PPO

## 2020-04-04 ENCOUNTER — Encounter: Payer: Self-pay | Admitting: Oncology

## 2020-04-04 ENCOUNTER — Other Ambulatory Visit: Payer: Self-pay

## 2020-04-04 ENCOUNTER — Inpatient Hospital Stay (HOSPITAL_BASED_OUTPATIENT_CLINIC_OR_DEPARTMENT_OTHER): Payer: BC Managed Care – PPO | Admitting: Oncology

## 2020-04-04 VITALS — BP 131/84 | HR 65 | Temp 98.6°F | Resp 16 | Wt 139.6 lb

## 2020-04-04 DIAGNOSIS — C259 Malignant neoplasm of pancreas, unspecified: Secondary | ICD-10-CM | POA: Diagnosis not present

## 2020-04-04 DIAGNOSIS — Z5111 Encounter for antineoplastic chemotherapy: Secondary | ICD-10-CM | POA: Diagnosis present

## 2020-04-04 DIAGNOSIS — E871 Hypo-osmolality and hyponatremia: Secondary | ICD-10-CM

## 2020-04-04 DIAGNOSIS — R634 Abnormal weight loss: Secondary | ICD-10-CM | POA: Diagnosis not present

## 2020-04-04 DIAGNOSIS — Z87891 Personal history of nicotine dependence: Secondary | ICD-10-CM | POA: Diagnosis not present

## 2020-04-04 DIAGNOSIS — C787 Secondary malignant neoplasm of liver and intrahepatic bile duct: Secondary | ICD-10-CM | POA: Diagnosis present

## 2020-04-04 DIAGNOSIS — Z95828 Presence of other vascular implants and grafts: Secondary | ICD-10-CM

## 2020-04-04 DIAGNOSIS — D701 Agranulocytosis secondary to cancer chemotherapy: Secondary | ICD-10-CM | POA: Diagnosis not present

## 2020-04-04 DIAGNOSIS — C250 Malignant neoplasm of head of pancreas: Secondary | ICD-10-CM | POA: Diagnosis present

## 2020-04-04 DIAGNOSIS — K831 Obstruction of bile duct: Secondary | ICD-10-CM | POA: Diagnosis not present

## 2020-04-04 LAB — CBC WITH DIFFERENTIAL/PLATELET
Abs Immature Granulocytes: 0.01 10*3/uL (ref 0.00–0.07)
Basophils Absolute: 0.1 10*3/uL (ref 0.0–0.1)
Basophils Relative: 2 %
Eosinophils Absolute: 0.1 10*3/uL (ref 0.0–0.5)
Eosinophils Relative: 2 %
HCT: 31.5 % — ABNORMAL LOW (ref 36.0–46.0)
Hemoglobin: 11.1 g/dL — ABNORMAL LOW (ref 12.0–15.0)
Immature Granulocytes: 0 %
Lymphocytes Relative: 30 %
Lymphs Abs: 1.4 10*3/uL (ref 0.7–4.0)
MCH: 33 pg (ref 26.0–34.0)
MCHC: 35.2 g/dL (ref 30.0–36.0)
MCV: 93.8 fL (ref 80.0–100.0)
Monocytes Absolute: 0.5 10*3/uL (ref 0.1–1.0)
Monocytes Relative: 11 %
Neutro Abs: 2.7 10*3/uL (ref 1.7–7.7)
Neutrophils Relative %: 55 %
Platelets: 244 10*3/uL (ref 150–400)
RBC: 3.36 MIL/uL — ABNORMAL LOW (ref 3.87–5.11)
RDW: 15.4 % (ref 11.5–15.5)
WBC: 4.8 10*3/uL (ref 4.0–10.5)
nRBC: 0 % (ref 0.0–0.2)

## 2020-04-04 LAB — COMPREHENSIVE METABOLIC PANEL
ALT: 28 U/L (ref 0–44)
AST: 23 U/L (ref 15–41)
Albumin: 3.7 g/dL (ref 3.5–5.0)
Alkaline Phosphatase: 105 U/L (ref 38–126)
Anion gap: 7 (ref 5–15)
BUN: 13 mg/dL (ref 8–23)
CO2: 27 mmol/L (ref 22–32)
Calcium: 8.6 mg/dL — ABNORMAL LOW (ref 8.9–10.3)
Chloride: 103 mmol/L (ref 98–111)
Creatinine, Ser: 0.56 mg/dL (ref 0.44–1.00)
GFR calc non Af Amer: >60 mL/min (ref >60)
Glucose, Bld: 117 mg/dL — ABNORMAL HIGH (ref 70–99)
Potassium: 4.1 mmol/L (ref 3.5–5.1)
Sodium: 137 mmol/L (ref 135–145)
Total Bilirubin: 1 mg/dL (ref 0.3–1.2)
Total Protein: 6.1 g/dL — ABNORMAL LOW (ref 6.5–8.1)

## 2020-04-04 MED ORDER — SODIUM CHLORIDE 0.9 % IV SOLN
150.0000 mg/m2 | Freq: Once | INTRAVENOUS | Status: AC
  Administered 2020-04-04: 260 mg via INTRAVENOUS
  Filled 2020-04-04: qty 10

## 2020-04-04 MED ORDER — ATORVASTATIN CALCIUM 40 MG PO TABS: 40.0000 mg | ORAL_TABLET | Freq: Every day | ORAL | 1 refills | Status: DC

## 2020-04-04 MED ORDER — SODIUM CHLORIDE 0.9 % IV SOLN
150.0000 mg | Freq: Once | INTRAVENOUS | Status: AC
  Administered 2020-04-04: 150 mg via INTRAVENOUS
  Filled 2020-04-04: qty 5

## 2020-04-04 MED ORDER — SODIUM CHLORIDE 0.9 % IV SOLN: 2400.0000 mg/m2 | INTRAVENOUS | Status: DC

## 2020-04-04 MED ORDER — OXALIPLATIN CHEMO INJECTION 100 MG/20ML
85.0000 mg/m2 | Freq: Once | INTRAVENOUS | Status: AC
  Administered 2020-04-04: 150 mg via INTRAVENOUS
  Filled 2020-04-04: qty 20

## 2020-04-04 MED ORDER — SODIUM CHLORIDE 0.9 % IV SOLN
10.0000 mg | Freq: Once | INTRAVENOUS | Status: AC
  Administered 2020-04-04: 10 mg via INTRAVENOUS
  Filled 2020-04-04: qty 1

## 2020-04-04 MED ORDER — SODIUM CHLORIDE 0.9 % IV SOLN
4200.0000 mg | INTRAVENOUS | Status: DC
  Administered 2020-04-04: 4200 mg via INTRAVENOUS
  Filled 2020-04-04: qty 84

## 2020-04-04 MED ORDER — ATROPINE SULFATE 1 MG/ML IJ SOLN
0.5000 mg | Freq: Once | INTRAMUSCULAR | Status: AC | PRN
  Administered 2020-04-04: 0.5 mg via INTRAVENOUS
  Filled 2020-04-04: qty 1

## 2020-04-04 MED ORDER — SODIUM CHLORIDE 0.9 % IV SOLN
700.0000 mg | Freq: Once | INTRAVENOUS | Status: AC
  Administered 2020-04-04: 700 mg via INTRAVENOUS
  Filled 2020-04-04: qty 25

## 2020-04-04 MED ORDER — SODIUM CHLORIDE 0.9% FLUSH
10.0000 mL | Freq: Once | INTRAVENOUS | Status: AC
  Administered 2020-04-04: 10 mL via INTRAVENOUS
  Filled 2020-04-04: qty 10

## 2020-04-04 MED ORDER — DEXTROSE 5 % IV SOLN
Freq: Once | INTRAVENOUS | Status: AC
  Filled 2020-04-04: qty 250

## 2020-04-04 MED ORDER — PALONOSETRON HCL INJECTION 0.25 MG/5ML
0.2500 mg | Freq: Once | INTRAVENOUS | Status: AC
  Administered 2020-04-04: 0.25 mg via INTRAVENOUS
  Filled 2020-04-04: qty 5

## 2020-04-04 NOTE — Progress Notes (Signed)
Hematology/Oncology follow up note [facility] [phone] [phone]   Patient Care Team: Flinchum, Kelby Aline, FNP as PCP - General (Family Medicine) Flinchum, Kelby Aline, FNP (Family Medicine) [name], RN as Oncology Nurse Navigator  REFERRING PROVIDER: Sharmon Leyden*  CHIEF COMPLAINTS/REASON FOR VISIT:  Follow up for treatment of pancreatic adenocarcinoma  HISTORY OF PRESENTING ILLNESS:   Cassandra [name] is a  64 y.o.  female with PMH listed below was seen in consultation at the request of  Flinchum, Kelby Aline, F*  for evaluation of pancreatic adenocarcinoma Patient initially presented with jaundice, transaminitis, bilirubin was 9.9.  CA 19-9 was 1874.  Patient also reports unintentional weight loss. 02/08/2020 MRI abdomen and MRCP with and without contrast was done at [facility] which showed pancreatic head mass measuring up to 3 cm, with marked associated narrowing of the portal vein confluence.  SMA is preserved.  Marked intrahepatic and extrahepatic biliary duct dilatation as well as mild dilatation of the main pancreatic duct. Multiple hepatic masses highly concerning for metastatic disease.  Patient underwent EUS on 02/19/2020, which showed irregular mass identified in the pancreatic head, hypoechoic, measured 43mx33mm, sonographic evidence concerning for invasion into the superior mesenteric artery.  There is no sign of significant abnormality in the main pancreatic duct.  Dilatation of common bile duct which measured up to 16 mm.  Region of celiac artery was visualized and showed no signs of significant abnormality.  No lymphadenopathy.  FNA showed adenocarcinoma.  02/19/2020, ERCP, malignant.  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  The stricture was treated with placement of wall flex metal stent.  [facility] ZPia Mauand was  recommended for 3 drug regimen FOLFIRINOX.  [facility]  Patient was referred to establish care today. She denies any pain.  Since stent placement, skin jaundice has improved.  Itchiness has also improved. Patient was accompanied by her husband today.  She has a family history of breast cancer in sister and paternal aunt, colon cancer paternal grandmother.  INTERVAL HISTORY Cassandra SPOERLis a 64y.o. female who has above history reviewed by me today presents for follow up visit for management of stage IV pancreatic cancer Problems and complaints are listed below: Patient had oxaliplatin and 50% of the irinotecan on day 1 of treatment, she developed slurred speech while she spoke to her husband.  Patient was seen by nurse practitioner JAnderson Maltaand patient was given Benadryl, Solu-Medrol 125 mg IV x1.  Vital signs are normal. I was notified and I saw the patient at the chair side.  Her symptoms is better but not fully resolved.  She feels " thick in my mouth".  Chemotherapy was held.  5-FU pump was not..  Patient was recommended to go to emergency room for evaluation of stroke.  Her symptoms fully resolved in ER.  Patient was seen by neurologist during admission.  She also had a stroke work-up including CT head, CT angiogram head, CTA neck, MRI brain, carotid duplex, echocardiogram.  All within normal limits.  Some mild atherosclerotic plaque formation on CTA neck and a carotid duplex.  Working diagnosis is TIA with slurred speech.  Patient was discharged on aspirin 81 mg and Lipitor 40 mg. Patient was noted to have hypoglycemia, A1c is 5.5. Today patient was seen post hospitalization and evaluation prior to chemotherapy with 5-FU pump. She  denies any additional episodes of slurred speech.  She has not started aspirin 81 mg or Lipitor 40 mg yet. She feels tired today.  Lack of appetite.  Not eating very much.  Weight loss 3 pounds.  #no reportable targetable  mutation on NGS 9/14/2021cycle 1 FOLFIRINOX.  Patient received oxaliplatin and about 50% of Irinotecan on day 1 and had experienced neurologic symptoms.  She went to ER and working diagnosis is TIA.  INTERVAL HISTORY Cassandra [name] is a 64 y.o. female who has above history reviewed by me today presents for acute visit.   Problems and complaints are listed below 03/19/2020-03/21/2020 patient was admitted due to sepsis with strep pneumonia bacteremia.  Patient was treated with IV Rocephin.  TEE was done which showed no vegetation.  No PFO or ASD.  Patient was discharged home and he finished full course of 14 days of IV Rocephin on 04/02/2020 per ID recommendation.  Repeat blood culture was also negative.  Patient reports feeling very well today.  Appetite is fair.  Fatigue has improved.  No jaundice.  Denies any fever, chills, cough, abdominal pain, dysuria.  Review of Systems  Constitutional: Positive for fatigue. Negative for chills, fever and unexpected weight change.  HENT:   Negative for hearing loss and voice change.   Eyes: Negative for eye problems.  Respiratory: Negative for chest tightness and cough.   Cardiovascular: Negative for chest pain.  Gastrointestinal: Negative for abdominal distention, abdominal pain and blood in stool.  Endocrine: Negative for hot flashes.  Genitourinary: Negative for difficulty urinating and frequency.   Musculoskeletal: Negative for arthralgias.  Skin: Negative for itching and rash.  Neurological: Negative for extremity weakness.  Hematological: Negative for adenopathy.  Psychiatric/Behavioral: Negative for confusion.    MEDICAL HISTORY:  Past Medical History:  Diagnosis Date  . Cancer [facility])    pancreatic cancer  . Colon polyps   . Family history of breast cancer   . Osteopenia after menopause 05/2017   femoral neck T score -2.0    SURGICAL HISTORY: Past Surgical History:  Procedure Laterality Date  . COLONOSCOPY  07/2015   WNL  . COLONOSCOPY   2008/2011  . OVARIAN CYST REMOVAL  1992   dermoid-Dr Graford  . PORTA CATH INSERTION N/A 03/07/2020   Procedure: PORTA CATH INSERTION;  Surgeon: Algernon Huxley, MD;  Location: [place] [facility];  Service: Cardiovascular;  Laterality: N/A;  . TEE WITHOUT CARDIOVERSION N/A 03/21/2020   Procedure: TRANSESOPHAGEAL ECHOCARDIOGRAM (TEE);  Surgeon: Dionisio David, MD;  Location: [facility] ORS;  Service: Cardiovascular;  Laterality: N/A;  . TUBAL LIGATION  1993    SOCIAL HISTORY: Social History   Socioeconomic History  . Marital status: Married    Spouse name: Not on file  . Number of children: 2  . Years of education: Not on file  . Highest education level: Not on file  Occupational History  . Occupation: Pharmacist, hospital    Comment: retired  . Occupation: Farmer  Tobacco Use  . Smoking status: Former Smoker    Packs/day: 0.50    Years: 10.00    Pack years: 5.00    Types: Cigarettes    Quit date: 1987    Years since quitting: 34.7  . Smokeless tobacco: Never Used  . Tobacco comment: Quit smoking 1987  Vaping Use  . Vaping Use: Never used  Substance and Sexual Activity  . Alcohol use: Not Currently    Comment: 0-2 mixed drinks a day  . Drug use: No  .  Sexual activity: Not Currently    Birth control/protection: Post-menopausal  Other Topics Concern  . Not on file  Social History Narrative  . Not on file   Social Determinants of Health   Financial Resource Strain:   . Difficulty of Paying Living Expenses: Not on file  Food Insecurity:   . Worried About Charity fundraiser in the Last Year: Not on file  . Ran Out of Food in the Last Year: Not on file  Transportation Needs:   . Lack of Transportation (Medical): Not on file  . Lack of Transportation (Non-Medical): Not on file  Physical Activity:   . Days of Exercise per Week: Not on file  . Minutes of Exercise per Session: Not on file  Stress:   . Feeling of Stress : Not on file  Social Connections:   . Frequency of Communication with  Friends and Family: Not on file  . Frequency of Social Gatherings with Friends and Family: Not on file  . Attends Religious Services: Not on file  . Active Member of Clubs or Organizations: Not on file  . Attends Archivist Meetings: Not on file  . Marital Status: Not on file  Intimate Partner Violence:   . Fear of Current or Ex-Partner: Not on file  . Emotionally Abused: Not on file  . Physically Abused: Not on file  . Sexually Abused: Not on file    FAMILY HISTORY: Family History  Problem Relation Age of Onset  . Breast cancer Paternal Aunt 33  . Diabetes Mother   . Osteoporosis Mother   . Hyperlipidemia Father   . Rheumatic fever Father   . Valvular heart disease Father   . Cancer Paternal Grandmother        possible colon  . Breast cancer Sister 59    ALLERGIES:  is allergic to penicillin g.  MEDICATIONS:  Current Outpatient Medications  Medication Sig Dispense Refill  . aspirin EC 81 MG tablet Take 1 tablet (81 mg total) by mouth daily. Swallow whole. 150 tablet 2  . atorvastatin (LIPITOR) 40 MG tablet Take 1 tablet (40 mg total) by mouth daily. 30 tablet 0  . Cholecalciferol 25 MCG (1000 UT) tablet Take 2,000 Units by mouth daily.    . cholestyramine (QUESTRAN) 4 GM/DOSE powder Take 4 g by mouth in the morning and at bedtime.     . lidocaine-prilocaine (EMLA) cream Apply to affected area once (Patient taking differently: Apply 1 application topically as directed. ) 30 g 3  . loperamide (IMODIUM A-D) 2 MG tablet Take 2 at onset of diarrhea, then 1 every 2hrs until 12hr without a BM. May take 2 tab every 4hrs at bedtime. If diarrhea recurs repeat. 100 tablet 1  . Multiple Vitamins-Minerals (MULTIVITAMIN ADULT PO) Take 1 tablet by mouth daily.     . ondansetron (ZOFRAN) 8 MG tablet Take 1 tablet (8 mg total) by mouth 2 (two) times daily as needed. Start on day 3 after chemotherapy. 30 tablet 1  . prochlorperazine (COMPAZINE) 10 MG tablet Take 1 tablet (10 mg  total) by mouth every 6 (six) hours as needed (Nausea or vomiting). 30 tablet 1  . simethicone (GAS-X) 80 MG chewable tablet Chew 1 tablet (80 mg total) by mouth every 8 (eight) hours as needed for flatulence. 60 tablet 0   No current facility-administered medications for this visit.     PHYSICAL EXAMINATION: ECOG PERFORMANCE STATUS: 1 - Symptomatic but completely ambulatory Vitals:   04/04/20 0848  BP: 131/84  Pulse: 65  Resp: 16  Temp: 98.6 F (37 C)   Filed Weights   04/04/20 0848  Weight: 139 lb 9.6 oz (63.3 kg)    Physical Exam Constitutional:      General: She is not in acute distress. HENT:     Head: Normocephalic and atraumatic.  Eyes:     General: No scleral icterus. Cardiovascular:     Rate and Rhythm: Normal rate and regular rhythm.     Heart sounds: Normal heart sounds.  Pulmonary:     Effort: Pulmonary effort is normal. No respiratory distress.     Breath sounds: No wheezing.  Abdominal:     General: Bowel sounds are normal. There is no distension.     Palpations: Abdomen is soft.  Musculoskeletal:        General: No deformity. Normal range of motion.     Cervical back: Normal range of motion and neck supple.  Skin:    General: Skin is warm and dry.     Coloration: Skin is not jaundiced.     Findings: No erythema or rash.  Neurological:     Mental Status: She is alert and oriented to person, place, and time. Mental status is at baseline.     Cranial Nerves: No cranial nerve deficit.     Coordination: Coordination normal.  Psychiatric:        Mood and Affect: Mood normal.     LABORATORY DATA:  I have reviewed the data as listed Lab Results  Component Value Date   WBC 3.8 (L) 03/21/2020   HGB 9.0 (L) 03/21/2020   HCT 24.9 (L) 03/21/2020   MCV 91.2 03/21/2020   PLT 174 03/21/2020   Recent Labs    02/06/20 1049 02/29/20 1314 03/18/20 1415 03/18/20 1415 03/19/20 1024 03/20/20 0411 03/21/20 0318  NA 137   < > 132*   < > 129* 136 140  K  4.1   < > 4.0   < > 3.8 4.0 3.7  CL 99   < > 98   < > 95* 104 108  CO2 23   < > 24   < > _0 GLUCOSE 113*   < > 138*   < > 145* 126* 114*  BUN 11   < > 12   < > 11 7* 6*  CREATININE 0.80   < > 0.81   < > 0.85 0.66 0.52  CALCIUM 10.2   < > 8.5*   < > 8.9 8.3* 8.2*  GFRNONAA 78   < > >60   < > >60 >60 >60  GFRAA 90   < > >60   < > >60 >60 >60  PROT 7.0   < > 6.7  --  7.1  --  5.3*  ALBUMIN 4.9*   < > 3.6  --  3.5  --  2.5*  AST 195*   < > 20  --  30  --  52*  ALT 366*   < > 36  --  43  --  74*  ALKPHOS 934*   < > 124  --  125  --  112  BILITOT 9.9*   < > 1.7*  --  1.5*  --  1.0  BILIDIR 8.11*  --   --   --   --   --   --   IBILI 1.79*  --   --   --   --   --   --    < > =  values in this interval not displayed.   Iron/TIBC/Ferritin/ %Sat No results found for: IRON, TIBC, FERRITIN, IRONPCTSAT    RADIOGRAPHIC STUDIES: I have personally reviewed the radiological images as listed and agreed with the findings in the report. Reviewed findings of MRI abdomen MRCP done at [facility]. CT ANGIO HEAD W OR WO CONTRAST  Result Date: 03/12/2020 CLINICAL DATA:  Slurred speech.  Pancreatic cancer patient. EXAM: CT ANGIOGRAPHY HEAD AND NECK TECHNIQUE: Multidetector CT imaging of the head and neck was performed using the standard protocol during bolus administration of intravenous contrast. Multiplanar CT image reconstructions and MIPs were obtained to evaluate the vascular anatomy. Carotid stenosis measurements (when applicable) are obtained utilizing NASCET criteria, using the distal internal carotid diameter as the denominator. CONTRAST:  6m OMNIPAQUE IOHEXOL 350 MG/ML SOLN COMPARISON:  Head CT yesterday. FINDINGS: CT HEAD FINDINGS Brain: No evidence of acute infarction. No accelerated brain atrophy. No mass, hemorrhage, hydrocephalus or extra-axial collection. Vascular: There is atherosclerotic calcification of the major vessels at the base of the brain. Skull: Negative Sinuses: Clear Orbits: Normal  Review of the MIP images confirms the above findings CTA NECK FINDINGS Aortic arch: Aortic atherosclerosis. Branching pattern is normal without origin stenosis. Right carotid system: Common carotid artery widely patent to the bifurcation. Calcified plaque at the carotid bifurcation but no stenosis. ICA bulb widely patent. Cervical ICA is normal. Left carotid system: Common carotid artery widely patent to the bifurcation. Carotid bifurcation is normal without plaque or stenosis. Cervical ICA is normal. Vertebral arteries: Both vertebral arteries widely patent at their origins and through the cervical region to the foramen magnum. Skeleton: Ordinary cervical spondylosis. Other neck: No mass or lymphadenopathy. Upper chest: Normal Review of the MIP images confirms the above findings CTA HEAD FINDINGS Anterior circulation: Both internal carotid arteries widely patent through the skull base and siphon regions. Ordinary siphon atherosclerotic calcification but without stenosis greater than 30%. The anterior and middle cerebral vessels are normal without proximal stenosis, aneurysm or vascular malformation. No large or medium vessel occlusion. Posterior circulation: Both vertebral arteries widely patent to the basilar. No basilar stenosis. Posterior circulation branch vessels are normal. Venous sinuses: Patent and normal. Anatomic variants: None significant. Review of the MIP images confirms the above findings IMPRESSION: 1. Mild atherosclerotic disease at the right carotid bifurcation but without stenosis. Left carotid bifurcation normal. 2. No intracranial large or medium vessel occlusion or correctable proximal stenosis. 3. Aortic atherosclerosis. Aortic Atherosclerosis (ICD10-I70.0). Electronically Signed   By: MNelson ChimesM.D.   On: 03/12/2020 10:46   DG Chest 2 View  Result Date: 03/19/2020 CLINICAL DATA:  Bacteremia, evaluate for pneumonia. EXAM: CHEST - 2 VIEW COMPARISON:  Prior chest CT 03/13/2020. FINDINGS:  A right chest infusion port catheter is present with tip terminating in the region of the superior cavoatrial junction. Heart size within normal limits. Aortic atherosclerosis. No appreciable airspace consolidation. No evidence of pleural effusion or pneumothorax. No acute bony abnormality identified. IMPRESSION: No evidence of active cardiopulmonary disease. Aortic Atherosclerosis (ICD10-I70.0). Electronically Signed   By: KKellie SimmeringDO   On: 03/19/2020 12:19   CT HEAD WO CONTRAST  Result Date: 03/11/2020 CLINICAL DATA:  Slurred speech EXAM: CT HEAD WITHOUT CONTRAST TECHNIQUE: Contiguous axial images were obtained from the base of the skull through the vertex without intravenous contrast. COMPARISON:  None. FINDINGS: Brain: No evidence of acute territorial infarction, hemorrhage, hydrocephalus,extra-axial collection or mass lesion/mass effect. Normal gray-white differentiation. Ventricles are normal in size and contour. Vascular: No hyperdense vessel  or unexpected calcification. Skull: The skull is intact. No fracture or focal lesion identified. Sinuses/Orbits: The visualized paranasal sinuses and mastoid air cells are clear. The orbits and globes intact. Other: None IMPRESSION: No acute intracranial abnormality. Electronically Signed   By: Prudencio Pair M.D.   On: 03/11/2020 16:48   CT ANGIO NECK W OR WO CONTRAST  Result Date: 03/12/2020 CLINICAL DATA:  Slurred speech.  Pancreatic cancer patient. EXAM: CT ANGIOGRAPHY HEAD AND NECK TECHNIQUE: Multidetector CT imaging of the head and neck was performed using the standard protocol during bolus administration of intravenous contrast. Multiplanar CT image reconstructions and MIPs were obtained to evaluate the vascular anatomy. Carotid stenosis measurements (when applicable) are obtained utilizing NASCET criteria, using the distal internal carotid diameter as the denominator. CONTRAST:  39m OMNIPAQUE IOHEXOL 350 MG/ML SOLN COMPARISON:  Head CT yesterday.  FINDINGS: CT HEAD FINDINGS Brain: No evidence of acute infarction. No accelerated brain atrophy. No mass, hemorrhage, hydrocephalus or extra-axial collection. Vascular: There is atherosclerotic calcification of the major vessels at the base of the brain. Skull: Negative Sinuses: Clear Orbits: Normal Review of the MIP images confirms the above findings CTA NECK FINDINGS Aortic arch: Aortic atherosclerosis. Branching pattern is normal without origin stenosis. Right carotid system: Common carotid artery widely patent to the bifurcation. Calcified plaque at the carotid bifurcation but no stenosis. ICA bulb widely patent. Cervical ICA is normal. Left carotid system: Common carotid artery widely patent to the bifurcation. Carotid bifurcation is normal without plaque or stenosis. Cervical ICA is normal. Vertebral arteries: Both vertebral arteries widely patent at their origins and through the cervical region to the foramen magnum. Skeleton: Ordinary cervical spondylosis. Other neck: No mass or lymphadenopathy. Upper chest: Normal Review of the MIP images confirms the above findings CTA HEAD FINDINGS Anterior circulation: Both internal carotid arteries widely patent through the skull base and siphon regions. Ordinary siphon atherosclerotic calcification but without stenosis greater than 30%. The anterior and middle cerebral vessels are normal without proximal stenosis, aneurysm or vascular malformation. No large or medium vessel occlusion. Posterior circulation: Both vertebral arteries widely patent to the basilar. No basilar stenosis. Posterior circulation branch vessels are normal. Venous sinuses: Patent and normal. Anatomic variants: None significant. Review of the MIP images confirms the above findings IMPRESSION: 1. Mild atherosclerotic disease at the right carotid bifurcation but without stenosis. Left carotid bifurcation normal. 2. No intracranial large or medium vessel occlusion or correctable proximal stenosis. 3.  Aortic atherosclerosis. Aortic Atherosclerosis (ICD10-I70.0). Electronically Signed   By: MNelson ChimesM.D.   On: 03/12/2020 10:46   CT CHEST W CONTRAST  Result Date: 03/13/2020 CLINICAL DATA:  64year old female with history of pancreatic cancer. EXAM: CT CHEST WITH CONTRAST TECHNIQUE: Multidetector CT imaging of the chest was performed during intravenous contrast administration. CONTRAST:  762mOMNIPAQUE IOHEXOL 300 MG/ML  SOLN COMPARISON:  No priors. FINDINGS: Cardiovascular: Heart size is normal. There is no significant pericardial fluid, thickening or pericardial calcification. 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 No definite coronary artery calcifications. Right internal jugular single-lumen porta cath with tip terminating in the superior aspect of the right atrium. Mediastinum/Nodes: No pathologically enlarged mediastinal or hilar lymph nodes. Esophagus is unremarkable in appearance. No axillary lymphadenopathy. Lungs/Pleura: No suspicious appearing pulmonary nodules or masses are noted. No acute consolidative airspace disease. No pleural effusions. Upper Abdomen: Extensive pneumobilia. Musculoskeletal: There are no aggressive appearing lytic or blastic lesions noted in the visualized portions of the skeleton. IMPRESSION: 1.  No findings to suggest metastatic disease to the thorax. 2. 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 Recommend annual imaging followup by CTA or MRA. This recommendation follows 2010 ACCF/AHA/AATS/ACR/ASA/SCA/SCAI/SIR/STS/SVM Guidelines for the Diagnosis and Management of Patients with Thoracic Aortic Disease. Circulation. 2010; 121: [id number]. Aortic aneurysm NOS (ICD10-I71.9). Aortic Atherosclerosis (ICD10-I70.0). Electronically Signed   By: Vinnie Langton M.D.   On: 03/13/2020 11:59   MR BRAIN WO CONTRAST  Result Date: 03/12/2020 CLINICAL DATA:  Transit ischemic attack.   Slurred speech. EXAM: MRI HEAD WITHOUT CONTRAST TECHNIQUE: Multiplanar, multiecho pulse sequences of the brain and surrounding structures were obtained without intravenous contrast. COMPARISON:  03/11/2020 head CT.  03/12/2020 CTA head and neck. FINDINGS: Brain: No acute infarct or intracranial hemorrhage. Cerebral volume is within normal limits. Minimal chronic microvascular ischemic changes. No midline shift, ventriculomegaly or extra-axial fluid collection. No mass lesion. Vascular: Please see same day CTA. Skull and upper cervical spine: Normal marrow signal. Sinuses/Orbits: Normal orbits. Clear paranasal sinuses. No mastoid effusion. Other: None. IMPRESSION: No acute intracranial process. Minimal chronic microvascular ischemic changes. Electronically Signed   By: Primitivo Gauze M.D.   On: 03/12/2020 11:49   US Carotid Bilateral (at [facility] and [facility] only)  Result Date: 03/12/2020 CLINICAL DATA:  64 year old female with a history of TIA EXAM: BILATERAL CAROTID DUPLEX ULTRASOUND TECHNIQUE: Pearline Cables scale imaging, color Doppler and duplex ultrasound were performed of bilateral carotid and vertebral arteries in the neck. COMPARISON:  None. FINDINGS: Criteria: Quantification of carotid stenosis is based on velocity parameters that correlate the residual internal carotid diameter with NASCET-based stenosis levels, using the diameter of the distal internal carotid lumen as the denominator for stenosis measurement. The following velocity measurements were obtained: RIGHT ICA:  Systolic 528 cm/sec, Diastolic 52 cm/sec CCA:  98 cm/sec SYSTOLIC ICA/CCA RATIO:  1.6 ECA:  170 cm/sec LEFT ICA:  Systolic 89 cm/sec, Diastolic 14 cm/sec CCA:  95 cm/sec SYSTOLIC ICA/CCA RATIO:  0.9 ECA:  119 cm/sec Right Brachial SBP: Not acquired Left Brachial SBP: Not acquired RIGHT CAROTID ARTERY: No significant calcifications of the right common carotid artery. Intermediate waveform maintained. Heterogeneous and partially calcified plaque at  the right carotid bifurcation. No significant lumen shadowing. Low resistance waveform of the right ICA. No significant tortuosity. RIGHT VERTEBRAL ARTERY: Antegrade flow with low resistance waveform. LEFT CAROTID ARTERY: No significant calcifications of the left common carotid artery. Intermediate waveform maintained. Heterogeneous and partially calcified plaque at the left carotid bifurcation without significant lumen shadowing. Low resistance waveform of the left ICA. No significant tortuosity. LEFT VERTEBRAL ARTERY:  Antegrade flow with low resistance waveform. IMPRESSION: Right: Heterogeneous and partially calcified plaque at the right carotid bifurcation, with discordant results regarding degree of stenosis by established duplex criteria. Peak velocity suggests 50%-69% stenosis, with the ICA/ CCA ratio suggesting a lesser degree of stenosis. If establishing a more accurate degree of stenosis is required, cerebral angiogram should be considered, or as a second best test, CTA. Left: Color duplex indicates minimal heterogeneous and calcified plaque, with no hemodynamically significant stenosis by duplex criteria in the extracranial cerebrovascular circulation. Signed, Dulcy Fanny. Dellia Nims, RPVI [facility] Radiology Electronically Signed   By: Corrie Mckusick D.O.   On: 03/12/2020 14:02   PERIPHERAL VASCULAR CATHETERIZATION  Result Date: 03/07/2020 See op note  ECHOCARDIOGRAM COMPLETE  Result Date: 03/12/2020    ECHOCARDIOGRAM REPORT   Patient Name:   KARILYNN CARRANZA Date of Exam: 03/12/2020 Medical Rec #:  [id number]   Height:       67.0 in Accession #:    [id number]  Weight:       145.3 lb Date of Birth:  Jul 18, 1955   BSA:          1.765 m Patient Age:    37 years    BP:           125/74 mmHg Patient Gender: F           HR:           63 bpm. Exam Location:  [facility] Procedure: 2D Echo, Cardiac Doppler and Color Doppler Indications:     TIA 435.9  History:          Patient has no prior history of Echocardiogram examinations. No                  cardiac hsitory listed.  Sonographer:     Sherrie Sport RDCS (AE) Referring Phys:  [id number] Sidney Ace Diagnosing Phys: Kate Sable MD IMPRESSIONS  1. Left ventricular ejection fraction, by estimation, is 65 to 70%. The left ventricle has normal function. The left ventricle has no regional wall motion abnormalities. Left ventricular diastolic parameters were normal.  2. Right ventricular systolic function is normal. The right ventricular size is normal. There is normal pulmonary artery systolic pressure.  3. The mitral valve is normal in structure. No evidence of mitral valve regurgitation. No evidence of mitral stenosis.  4. The aortic valve is normal in structure. Aortic valve regurgitation is not visualized. No aortic stenosis is present.  5. The inferior vena cava is normal in size with greater than 50% respiratory variability, suggesting right atrial pressure of 3 mmHg. FINDINGS  Left Ventricle: Left ventricular ejection fraction, by estimation, is 65 to 70%. The left ventricle has normal function. The left ventricle has no regional wall motion abnormalities. The left ventricular internal cavity size was normal in size. There is  no left ventricular hypertrophy. Left ventricular diastolic parameters were normal. Right Ventricle: The right ventricular size is normal. No increase in right ventricular wall thickness. Right ventricular systolic function is normal. There is normal pulmonary artery systolic pressure. The tricuspid regurgitant velocity is 2.57 m/s, and  with an assumed right atrial pressure of 3 mmHg, the estimated right ventricular systolic pressure is 40.9 mmHg. Left Atrium: Left atrial size was normal in size. Right Atrium: Right atrial size was normal in size. Pericardium: There is no evidence of pericardial effusion. Mitral Valve: The mitral valve is normal in structure. No evidence of mitral valve  regurgitation. No evidence of mitral valve stenosis. Tricuspid Valve: The tricuspid valve is normal in structure. Tricuspid valve regurgitation is not demonstrated. No evidence of tricuspid stenosis. Aortic Valve: The aortic valve is normal in structure. Aortic valve regurgitation is not visualized. No aortic stenosis is present. Aortic valve mean gradient measures 4.5 mmHg. Aortic valve peak gradient measures 7.3 mmHg. Aortic valve area, by VTI measures 3.23 cm. Pulmonic Valve: The pulmonic valve was normal in structure. Pulmonic valve regurgitation is not visualized. No evidence of pulmonic stenosis. Aorta: The aortic root is normal in size and structure. Venous: The inferior vena cava is normal in size with greater than 50% respiratory variability, suggesting right atrial pressure of 3 mmHg. IAS/Shunts: No atrial level shunt detected by color flow Doppler.  LEFT VENTRICLE PLAX 2D LVIDd:         4.00 cm  Diastology LVIDs:  2.28 cm  LV e' medial:    7.72 cm/s LV PW:         1.50 cm  LV E/e' medial:  10.8 LV IVS:        0.90 cm  LV e' lateral:   11.50 cm/s LVOT diam:     2.00 cm  LV E/e' lateral: 7.3 LV SV:         96 LV SV Index:   54 LVOT Area:     3.14 cm  RIGHT VENTRICLE RV Basal diam:  3.77 cm RV S prime:     19.00 cm/s TAPSE (M-mode): 3.7 cm LEFT ATRIUM             Index       RIGHT ATRIUM           Index LA diam:        2.30 cm 1.30 cm/m  RA Area:     16.70 cm LA Vol (A2C):   45.2 ml 25.60 ml/m RA Volume:   45.40 ml  25.72 ml/m LA Vol (A4C):   35.1 ml 19.88 ml/m LA Biplane Vol: 39.1 ml 22.15 ml/m  AORTIC VALVE                   PULMONIC VALVE AV Area (Vmax):    3.22 cm    PV Vmax:        0.79 m/s AV Area (Vmean):   3.07 cm    PV Peak grad:   2.5 mmHg AV Area (VTI):     3.23 cm    RVOT Peak grad: 4 mmHg AV Vmax:           135.50 cm/s AV Vmean:          99.250 cm/s AV VTI:            0.297 m AV Peak Grad:      7.3 mmHg AV Mean Grad:      4.5 mmHg LVOT Vmax:         139.00 cm/s LVOT Vmean:         97.100 cm/s LVOT VTI:          0.305 m LVOT/AV VTI ratio: 1.03  AORTA Ao Root diam: 3.50 cm MITRAL VALVE               TRICUSPID VALVE MV Area (PHT): 2.39 cm    TR Peak grad:   26.4 mmHg MV Decel Time: 317 msec    TR Vmax:        257.00 cm/s MV E velocity: 83.40 cm/s MV A velocity: 72.30 cm/s  SHUNTS MV E/A ratio:  1.15        Systemic VTI:  0.30 m                            Systemic Diam: 2.00 cm Kate Sable MD Electronically signed by Kate Sable MD Signature Date/Time: 03/12/2020/4:01:51 PM    Final    ECHO TEE  Result Date: 03/21/2020    TRANSESOPHOGEAL ECHO REPORT   Patient Name:   ANGELYNE TERWILLIGER Date of Exam: 03/21/2020 Medical Rec #:  [id number]   Height:       67.0 in Accession #:    [id number]  Weight:       138.0 lb Date of Birth:  Aug 15, 1955   BSA:          1.727 m Patient Age:  64 years    BP:           133/83 mmHg Patient Gender: F           HR:           83 bpm. Exam Location:  [facility] Procedure: Transesophageal Echo, Color Doppler, Cardiac Doppler and Saline            Contrast Bubble Study Indications:     Not listed on order  History:         Patient has prior history of Echocardiogram examinations, most                  recent 03/12/2020.  Sonographer:     Sherrie Sport RDCS (AE) Referring Phys:  [place] Diagnosing Phys: Neoma Laming MD PROCEDURE: The transesophogeal probe was passed without difficulty through the esophogus of the patient. Local oropharyngeal anesthetic was provided with Benzocaine spray. Sedation performed by performing physician. The patient's vital signs; including heart rate, blood pressure, and oxygen saturation; remained stable throughout the procedure. The patient developed no complications during the procedure. IMPRESSIONS  1. Left ventricular ejection fraction, by estimation, is 60 to 65%. The left ventricle has normal function. The left ventricle has no regional wall motion abnormalities.  2. Right ventricular systolic function is normal. The right  ventricular size is normal.  3. No left atrial/left atrial appendage thrombus was detected.  4. The mitral valve is normal in structure. Mild mitral valve regurgitation. No evidence of mitral stenosis.  5. The aortic valve is abnormal. Aortic valve regurgitation is mild. No aortic stenosis is present.  6. The inferior vena cava is normal in size with greater than 50% respiratory variability, suggesting right atrial pressure of 3 mmHg.  7. Agitated saline contrast bubble study was negative, with no evidence of any interatrial shunt. Conclusion(s)/Recommendation(s): No evidence of vegetation/infective endocarditis on this transesophageal echocardiogram. FINDINGS  Left Ventricle: Left ventricular ejection fraction, by estimation, is 60 to 65%. The left ventricle has normal function. The left ventricle has no regional wall motion abnormalities. The left ventricular internal cavity size was normal in size. There is  no left ventricular hypertrophy. Right Ventricle: The right ventricular size is normal. No increase in right ventricular wall thickness. Right ventricular systolic function is normal. Left Atrium: Left atrial size was normal in size. No left atrial/left atrial appendage thrombus was detected. Right Atrium: Right atrial size was normal in size. Pericardium: There is no evidence of pericardial effusion. Mitral Valve: The mitral valve is normal in structure. Mild mitral valve regurgitation. No evidence of mitral valve stenosis. Tricuspid Valve: The tricuspid valve is normal in structure. Tricuspid valve regurgitation is mild . No evidence of tricuspid stenosis. Aortic Valve: The aortic valve is abnormal. Aortic valve regurgitation is mild. No aortic stenosis is present. Pulmonic Valve: The pulmonic valve was normal in structure. Pulmonic valve regurgitation is trivial. No evidence of pulmonic stenosis. Aorta: The aortic root is normal in size and structure. Venous: The inferior vena cava is normal in size with  greater than 50% respiratory variability, suggesting right atrial pressure of 3 mmHg. IAS/Shunts: No atrial level shunt detected by color flow Doppler. Agitated saline contrast was given intravenously to evaluate for intracardiac shunting. Agitated saline contrast bubble study was negative, with no evidence of any interatrial shunt. There  is no evidence of a patent foramen ovale. No ventricular septal defect is seen or detected. There is no evidence of an atrial septal defect. [employer]  Humphrey Rolls MD Electronically signed by Neoma Laming MD Signature Date/Time: 03/21/2020/8:39:59 AM    Final      ASSESSMENT & PLAN:  1. Primary pancreatic cancer ([place])   2. Obstructive jaundice   3. Hyponatremia   4. 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 Chemotherapy was delayed due to sepsis secondary to bacteremia.  She finished course of antibiotics clinically is doing very well today. Labs are reviewed and discussed with patient. Counts acceptable to proceed with cycle 2 FOLFIRINOX.  Irinotecan dose was reduced at 150 mg/m. Patient has met with genetic counselor and a testing is pending.  #Obstructive jaundice status post biliary stent placement. Bilirubin has completely normalized.  #Weight loss, refer to nutritionist.  Weight is stable.  Continue nutrition supplements. Supportive care measures are necessary for patient well-being and will be provided as necessary. We spent sufficient time to discuss many aspect of care, questions were answered to patient's satisfaction.   All questions were answered. The patient knows to call the clinic with any problems questions or concerns.  Return of visit: Follow-up in 2 weeks for the next chemotherapy treatment.   [name], MD, PhD Hematology  Oncology [facility] at [facility] Pager- [phone] 04/04/2020

## 2020-04-04 NOTE — Progress Notes (Signed)
Patient denies new problems/concerns today.   °

## 2020-04-05 LAB — CANCER ANTIGEN 19-9: CA 19-9: 834 U/mL — ABNORMAL HIGH (ref 0–35)

## 2020-04-07 ENCOUNTER — Telehealth: Payer: Self-pay | Admitting: *Deleted

## 2020-04-07 ENCOUNTER — Other Ambulatory Visit: Payer: Self-pay

## 2020-04-07 ENCOUNTER — Inpatient Hospital Stay: Payer: BC Managed Care – PPO

## 2020-04-07 VITALS — BP 123/85 | HR 66 | Temp 97.6°F | Resp 18

## 2020-04-07 DIAGNOSIS — C259 Malignant neoplasm of pancreas, unspecified: Secondary | ICD-10-CM

## 2020-04-07 MED ORDER — HEPARIN SOD (PORK) LOCK FLUSH 100 UNIT/ML IV SOLN
500.0000 [IU] | Freq: Once | INTRAVENOUS | Status: DC | PRN
  Filled 2020-04-07: qty 5

## 2020-04-07 MED ORDER — SODIUM CHLORIDE 0.9% FLUSH
10.0000 mL | INTRAVENOUS | Status: DC | PRN
  Filled 2020-04-07: qty 10

## 2020-04-07 MED ORDER — HEPARIN SOD (PORK) LOCK FLUSH 100 UNIT/ML IV SOLN
INTRAVENOUS | Status: AC
  Filled 2020-04-07: qty 5

## 2020-04-07 NOTE — Telephone Encounter (Addendum)
Patient called to report that whilst getting her chemotherapy infusion Friday, 30 mins into her infusion of her second med, her lips started tingling and she got dizzy again.I asked if she reported this to her nurse at the time and she said "No, I just wanted to get the medicine, and maybe I should have" She reports that this lasted until 5 PM Friday; and she also had an episode on Saturday and again on Sunday and she took Benadryl with each episode. She denies any symptoms this morning. She is coming in today at 1 to get her pump removed and I expressed the importance of needing to tell infusion nurse of this at the time it happens and to please tell the nurse that removes her pump today. I called Lora Paula, RN Infusion charge nurse and informed her of this as well. Please advise of any needed interventions.

## 2020-04-07 NOTE — Progress Notes (Signed)
Pt here for 33fu pump dc. Pt called earlier and spoke with triage nurse.  Pt states that when she was here on 10/8- receiving C2 of Folfirinox- about 30 mins into the Irinotecan- she states she had similar symptoms as she did with first tretment with " fuzzy in head and slight dizziness "- states also had numbness in lips.  States she decided not to tell the nurse as she wanted to finish treatment and not go back to ER.  Pt states symptoms continued into around 5 pm.  Then when she got up 10/9 and 10/10  am- she had numbness in lips again . States both times she took benadryl with relief.  States no symptoms today. VSS today.  Sent secure chat message to Dr. Tasia Catchings .  Per Dr Tasia Catchings- pt can be discharged as symptoms resolved. Advised symptoms of lip tingling may be related to oxaliplatin. Advised to f/u with neurologist as scheduled for TIA per Dr Tasia Catchings.  Pt aware.  Will follow up here as scheduled .

## 2020-04-08 NOTE — Telephone Encounter (Signed)
04/08/2020  Pt was here for pump dc on 04/07/20 and voiced same concerns to Bertie, who notified Dr .Tasia Catchings via [facility].  Colletta Maryland RN: pt is here for pump d/c - received C2 of folfirinox on 10/8.  Pt states that about 30 mins into irinotecan - she experienced very similar symptoms as she did with 1st Cycle.( oxaliplatin was already completed ).  pt decided to not notify nurse as she wanted to finish tx.  she states felt " kinfd of dizzy and fuzzy in head "  and lips were very tingly...got better later on at night. states Saturday morning  & Sunday morning returned and both times she took benadryl with relief.  states no symptoms today .      Dr. Tasia Catchings: since her symptoms are resolved by now, she can be released as there is not much i can do right now.  Her earlier message says lip tingling which maybe due to oxaliplatin. she should continue followup with her neurologist for her TIA.

## 2020-04-09 ENCOUNTER — Encounter: Payer: Self-pay | Admitting: Licensed Clinical Social Worker

## 2020-04-09 ENCOUNTER — Telehealth: Payer: Self-pay | Admitting: Licensed Clinical Social Worker

## 2020-04-09 ENCOUNTER — Ambulatory Visit: Payer: Self-pay | Admitting: Licensed Clinical Social Worker

## 2020-04-09 DIAGNOSIS — Z1379 Encounter for other screening for genetic and chromosomal anomalies: Secondary | ICD-10-CM

## 2020-04-09 DIAGNOSIS — C259 Malignant neoplasm of pancreas, unspecified: Secondary | ICD-10-CM

## 2020-04-09 DIAGNOSIS — Z803 Family history of malignant neoplasm of breast: Secondary | ICD-10-CM

## 2020-04-09 NOTE — Telephone Encounter (Signed)
Revealed negative genetic testing.  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 This normal result is reassuring and indicates that it is unlikely Cassandra [name]'s cancer is due to a hereditary cause.  It is unlikely that there is an increased risk of another cancer due to a mutation in one of these genes.  However, genetic testing is not perfect, and cannot definitively rule out a hereditary cause.  It will be important for her to keep in contact with genetics to learn if any additional testing may be needed in the future.

## 2020-04-09 NOTE — Progress Notes (Signed)
HPI:  Ms. Franzoni was previously seen in the  Cancer Genetics clinic due to a personal and family history of cancer and concerns regarding a hereditary predisposition to cancer. Please refer to our prior cancer genetics clinic note for more information regarding our discussion, assessment and recommendations, at the time. Ms. Fenstermacher recent genetic test results were disclosed to her, as were recommendations warranted by these results. These results and recommendations are discussed in more detail below.  CANCER HISTORY:  Oncology History  Primary pancreatic cancer (HCC)  02/29/2020 Initial Diagnosis   Primary pancreatic cancer  (HCC)   02/29/2020 Cancer Staging   Staging form: Exocrine Pancreas, AJCC 8th Edition - Clinical stage from 02/29/2020: Stage IV (cT2, cN0, cM1) - Signed by [name], MD on 02/29/2020   03/11/2020 -  Chemotherapy   The patient had palonosetron (ALOXI) injection 0.25 mg, 0.25 mg, Intravenous,  Once, 2 of 4 cycles Administration: 0.25 mg (03/11/2020), 0.25 mg (04/04/2020) irinotecan (CAMPTOSAR) 260 mg in sodium chloride 0.9 % 500 mL chemo infusion, 150 mg/m2 = 260 mg, Intravenous,  Once, 2 of 4 cycles Dose modification: 180 mg/m2 (original dose 150 mg/m2, Cycle 3, Reason: Provider Judgment) Administration: 260 mg (03/11/2020), 260 mg (04/04/2020) oxaliplatin (ELOXATIN) 150 mg in dextrose 5 % 500 mL chemo infusion, 85 mg/m2 = 150 mg, Intravenous,  Once, 2 of 4 cycles Administration: 150 mg (03/11/2020), 150 mg (04/04/2020) fosaprepitant (EMEND) 150 mg in sodium chloride 0.9 % 145 mL IVPB, 150 mg, Intravenous,  Once, 2 of 4 cycles Administration: 150 mg (03/11/2020), 150 mg (04/04/2020) fluorouracil (ADRUCIL) 4,200 mg in sodium chloride 0.9 % 66 mL chemo infusion, 2,400 mg/m2 = 4,200 mg, Intravenous, 1 Day/Dose, 2 of 4 cycles Administration: 4,200 mg (03/14/2020) leucovorin 700 mg in sodium chloride 0.9 % 250 mL infusion, 704 mg, Intravenous,  Once, 2 of 4 cycles Administration: 700  mg (03/11/2020), 700 mg (04/04/2020)  for chemotherapy treatment.     Genetic Testing   Negative genetic testing. No pathogenic variants identified on the [facility] Multi-Cancer Panel. VUS in EGFR called c.1715C>G and VUS in NBN called c.1551C>A identified. The report date is 04/08/2020.  The Multi-Cancer Panel offered by [facility] includes sequencing and/or deletion duplication testing of the following 85 genes: AIP, ALK, APC, ATM, AXIN2,BAP1,  BARD1, BLM, BMPR1A, BRCA1, BRCA2, BRIP1, CASR, CDC73, CDH1, CDK4, CDKN1B, CDKN1C, CDKN2A (p14ARF), CDKN2A (p16INK4a), CEBPA, CHEK2, CTNNA1, DICER1, DIS3L2, EGFR (c.2369C>T, p.Thr790Met variant only), EPCAM (Deletion/duplication testing only), FH, FLCN, GATA2, GPC3, GREM1 (Promoter region deletion/duplication testing only), HOXB13 (c.251G>A, p.Gly84Glu), HRAS, KIT, MAX, MEN1, MET, MITF (c.952G>A, p.Glu318Lys variant only), MLH1, MSH2, MSH3, MSH6, MUTYH, NBN, NF1, NF2, NTHL1, PALB2, PDGFRA, PHOX2B, PMS2, POLD1, POLE, POT1, PRKAR1A, PTCH1, PTEN, RAD50, RAD51C, RAD51D, RB1, RECQL4, RET, RNF43, RUNX1, SDHAF2, SDHA (sequence changes only), SDHB, SDHC, SDHD, SMAD4, SMARCA4, SMARCB1, SMARCE1, STK11, SUFU, TERC, TERT, TMEM127, TP53, TSC1, TSC2, VHL, WRN and WT1.      FAMILY HISTORY:  We obtained a detailed, 4-generation family history.  Significant diagnoses are listed below: Family History  Problem Relation Age of Onset  . Breast cancer Paternal Aunt 1  . Diabetes Mother   . Osteoporosis Mother   . Hyperlipidemia Father   . Rheumatic fever Father   . Valvular heart disease Father   . Cancer Paternal Grandmother        possible colon  . Breast cancer Sister 42   Ms. Offner has a son, age 74 and daughter, 59. Her daughter is currently pregnant and due in November.  Of note, Ms. Salado' husband's mother had pancreatic cancer as well.  Ms. Conyer has 1 sister, Jenny Reichmann, who recently had breast cancer at 3 and was treated here, no genetic testing.   Ms. Ryland mother died at  79, no cancer history. Patient has 1 maternal aunt, no cancers. No known cancers in maternal cousins. Maternal grandmother died at 50, grandfather died at 33.  Ms. Lambeth father died at 80, no cancers. Patient had 2 paternal aunts. One aunt had breast cancer at 46 and died in her 1s. No information about her other aunt. No known cancers in paternal cousins. Paternal grandfather died in his mid 95s. Grandmother had cancer, suspected colon, died in her 63s.   Ms. Paradiso is unaware of previous family history of genetic testing for hereditary cancer risks. Patient's maternal ancestors are of [place] and [place] descent, and paternal ancestors are of [place] descent. There is no reported Ashkenazi Jewish ancestry. There is no known consanguinity.     GENETIC TEST RESULTS: Genetic testing reported out on 04/08/2020 through the [facility] Multi- cancer panel found no pathogenic mutations.   The Multi-Cancer Panel offered by [facility] includes sequencing and/or deletion duplication testing of the following 85 genes: AIP, ALK, APC, ATM, AXIN2,BAP1,  BARD1, BLM, BMPR1A, BRCA1, BRCA2, BRIP1, CASR, CDC73, CDH1, CDK4, CDKN1B, CDKN1C, CDKN2A (p14ARF), CDKN2A (p16INK4a), CEBPA, CHEK2, CTNNA1, DICER1, DIS3L2, EGFR (c.2369C>T, p.Thr790Met variant only), EPCAM (Deletion/duplication testing only), FH, FLCN, GATA2, GPC3, GREM1 (Promoter region deletion/duplication testing only), HOXB13 (c.251G>A, p.Gly84Glu), HRAS, KIT, MAX, MEN1, MET, MITF (c.952G>A, p.Glu318Lys variant only), MLH1, MSH2, MSH3, MSH6, MUTYH, NBN, NF1, NF2, NTHL1, PALB2, PDGFRA, PHOX2B, PMS2, POLD1, POLE, POT1, PRKAR1A, PTCH1, PTEN, RAD50, RAD51C, RAD51D, RB1, RECQL4, RET, RNF43, RUNX1, SDHAF2, SDHA (sequence changes only), SDHB, SDHC, SDHD, SMAD4, SMARCA4, SMARCB1, SMARCE1, STK11, SUFU, TERC, TERT, TMEM127, TP53, TSC1, TSC2, VHL, WRN and WT1.   The test report has been scanned into EPIC and is located under the Molecular Pathology section of the Results Review  tab.  A portion of the result report is included below for reference.     We discussed with Ms. Warmuth that because current genetic testing is not perfect, it is possible there may be a gene mutation in one of these genes that current testing cannot detect, but that chance is small.  We also discussed, that there could be another gene that has not yet been discovered, or that we have not yet tested, that is responsible for the cancer diagnoses in the family. It is also possible there is a hereditary cause for the cancer in the family that Ms. Westberg did not inherit and therefore was not identified in her testing.  Therefore, it is important to remain in touch with cancer genetics in the future so that we can continue to offer Ms. Sperbeck the most up to date genetic testing.   Genetic testing did identify 2 Variants of uncertain significance (VUS) - one in the EGFR gene called c.1715C>G, a second in the NBN gene called c.1551C>A.  At this time, it is unknown if these variants are associated with increased cancer risk or if they are normal findings, but most variants such as these get reclassified to being inconsequential. They should not be used to make medical management decisions. With time, we suspect the lab will determine the significance of these variants, if any. If we do learn more about them, we will try to contact Ms. Tipler to discuss it further. However, it is important to stay in touch with  [place] periodically and keep the address and phone number up to date.  ADDITIONAL GENETIC TESTING: We discussed with Ms. Mcnorton that her genetic testing was fairly extensive.  If there are genes identified to increase cancer risk that can be analyzed in the future, we would be happy to discuss and coordinate this testing at that time.    CANCER SCREENING RECOMMENDATIONS: Ms. Sleep test result is considered negative (normal).  This means that we have not identified a hereditary cause for her  personal and family history of  cancer at this time. Most cancers happen by chance and this negative test suggests that her cancer may fall into this category.    While reassuring, this does not definitively rule out a hereditary predisposition to cancer. It is still possible that there could be genetic mutations that are undetectable by current technology. There could be genetic mutations in genes that have not been tested or identified to increase cancer risk.  Therefore, it is recommended she continue to follow the cancer management and screening guidelines provided by her oncology and primary healthcare provider.   An individual's cancer risk and medical management are not determined by genetic test results alone. Overall cancer risk assessment incorporates additional factors, including personal medical history, family history, and any available genetic information that may result in a personalized plan for cancer prevention and surveillance.  RECOMMENDATIONS FOR FAMILY MEMBERS:  Relatives in this family might be at some increased risk of developing cancer, over the general population risk, simply due to the family history of cancer.  We recommended female relatives in this family have a yearly mammogram beginning at age 55, or 61 years younger than the earliest onset of cancer, an annual clinical breast exam, and perform monthly breast self-exams. Female relatives in this family should also have a gynecological exam as recommended by their primary provider.  All family members should be referred for colonoscopy starting at age 13.    It is also possible there is a hereditary cause for the cancer in Ms. Poma's family that she did not inherit and therefore was not identified in her.  Based on Ms. Sweda's family history, we recommended paternal relatives have genetic counseling and testing. Additionally, Ms. Cullinane mentioned her husband has a family history of pancreatic and stomach cancer.  Ms. Purdie will let us know if we can be of any  assistance in coordinating genetic counseling and/or testing for these family members.  FOLLOW-UP: Lastly, we discussed with Ms. Rosano that cancer genetics is a rapidly advancing field and it is possible that new genetic tests will be appropriate for her and/or her family members in the future. We encouraged her to remain in contact with cancer genetics on an annual basis so we can update her personal and family histories and let her know of advances in cancer genetics that may benefit this family.   Our contact number was provided. Ms. Chain questions were answered to her satisfaction, and she knows she is welcome to call us at anytime with additional questions or concerns.   Faith Rogue, MS, [facility] Genetic Counselor [email or web address] Phone: [phone]

## 2020-04-10 ENCOUNTER — Other Ambulatory Visit: Payer: Self-pay

## 2020-04-10 ENCOUNTER — Telehealth: Payer: Self-pay | Admitting: *Deleted

## 2020-04-10 MED ORDER — MAGIC MOUTHWASH W/LIDOCAINE: 5.0000 mL | Freq: Four times a day (QID) | ORAL | 1 refills | Status: DC | PRN

## 2020-04-10 NOTE — Telephone Encounter (Signed)
Prescription for magic mouthwash faxed to [facility].

## 2020-04-10 NOTE — Telephone Encounter (Signed)
Patient called reporting that after her treatment Friday, she has developed mouth sores, has a cold sore on her lip and the corners of her mouth are cracked. Asking what she can use on it. Please advise

## 2020-04-10 NOTE — Telephone Encounter (Signed)
Please fax the pre printed prescription to pharmacy

## 2020-04-10 NOTE — Telephone Encounter (Signed)
Recommend magic mouth wash w/ lidocain Switch and spit

## 2020-04-11 NOTE — Telephone Encounter (Signed)
Patient called triage RN Hassan Rowan and stated that [facility] would not have magic mouthwash ready today. Pt requested Rx be faxed to [facility] on [address]. Rx faxed.

## 2020-04-16 ENCOUNTER — Ambulatory Visit (INDEPENDENT_AMBULATORY_CARE_PROVIDER_SITE_OTHER): Payer: BC Managed Care – PPO | Admitting: Adult Health

## 2020-04-16 ENCOUNTER — Other Ambulatory Visit: Payer: Self-pay

## 2020-04-16 DIAGNOSIS — Z23 Encounter for immunization: Secondary | ICD-10-CM

## 2020-04-16 NOTE — Progress Notes (Signed)
Oncology MD advised her now would be best time for influenza vaccine during her treatment phase.  Nurse visit only. NP reviewed immunization questions. deferred to oncology recommendation.

## 2020-04-18 ENCOUNTER — Encounter: Payer: Self-pay | Admitting: Nurse Practitioner

## 2020-04-18 ENCOUNTER — Inpatient Hospital Stay: Payer: BC Managed Care – PPO

## 2020-04-18 ENCOUNTER — Other Ambulatory Visit: Payer: Self-pay

## 2020-04-18 ENCOUNTER — Inpatient Hospital Stay (HOSPITAL_BASED_OUTPATIENT_CLINIC_OR_DEPARTMENT_OTHER): Payer: BC Managed Care – PPO | Admitting: Oncology

## 2020-04-18 ENCOUNTER — Encounter: Payer: Self-pay | Admitting: Oncology

## 2020-04-18 VITALS — BP 133/86 | HR 70 | Temp 98.7°F | Resp 18 | Wt 136.6 lb

## 2020-04-18 DIAGNOSIS — E876 Hypokalemia: Secondary | ICD-10-CM | POA: Diagnosis not present

## 2020-04-18 DIAGNOSIS — C259 Malignant neoplasm of pancreas, unspecified: Secondary | ICD-10-CM | POA: Diagnosis not present

## 2020-04-18 DIAGNOSIS — Z5111 Encounter for antineoplastic chemotherapy: Secondary | ICD-10-CM

## 2020-04-18 DIAGNOSIS — D709 Neutropenia, unspecified: Secondary | ICD-10-CM

## 2020-04-18 DIAGNOSIS — Z95828 Presence of other vascular implants and grafts: Secondary | ICD-10-CM

## 2020-04-18 HISTORY — DX: Neutropenia, unspecified: D70.9

## 2020-04-18 LAB — CBC WITH DIFFERENTIAL/PLATELET
Abs Immature Granulocytes: 0.01 10*3/uL (ref 0.00–0.07)
Basophils Absolute: 0 10*3/uL (ref 0.0–0.1)
Basophils Relative: 1 %
Eosinophils Absolute: 0.1 10*3/uL (ref 0.0–0.5)
Eosinophils Relative: 3 %
HCT: 28.7 % — ABNORMAL LOW (ref 36.0–46.0)
Hemoglobin: 10.4 g/dL — ABNORMAL LOW (ref 12.0–15.0)
Immature Granulocytes: 0 %
Lymphocytes Relative: 46 %
Lymphs Abs: 1.2 10*3/uL (ref 0.7–4.0)
MCH: 33.3 pg (ref 26.0–34.0)
MCHC: 36.2 g/dL — ABNORMAL HIGH (ref 30.0–36.0)
MCV: 92 fL (ref 80.0–100.0)
Monocytes Absolute: 0.6 10*3/uL (ref 0.1–1.0)
Monocytes Relative: 24 %
Neutro Abs: 0.7 10*3/uL — ABNORMAL LOW (ref 1.7–7.7)
Neutrophils Relative %: 26 %
Platelets: 136 10*3/uL — ABNORMAL LOW (ref 150–400)
RBC: 3.12 MIL/uL — ABNORMAL LOW (ref 3.87–5.11)
RDW: 14.6 % (ref 11.5–15.5)
WBC: 2.6 10*3/uL — ABNORMAL LOW (ref 4.0–10.5)
nRBC: 0 % (ref 0.0–0.2)

## 2020-04-18 LAB — COMPREHENSIVE METABOLIC PANEL
ALT: 35 U/L (ref 0–44)
AST: 28 U/L (ref 15–41)
Albumin: 3.5 g/dL (ref 3.5–5.0)
Alkaline Phosphatase: 103 U/L (ref 38–126)
Anion gap: 7 (ref 5–15)
BUN: 11 mg/dL (ref 8–23)
CO2: 25 mmol/L (ref 22–32)
Calcium: 8.4 mg/dL — ABNORMAL LOW (ref 8.9–10.3)
Chloride: 107 mmol/L (ref 98–111)
Creatinine, Ser: 0.65 mg/dL (ref 0.44–1.00)
GFR, Estimated: >60 mL/min (ref >60)
Glucose, Bld: 161 mg/dL — ABNORMAL HIGH (ref 70–99)
Potassium: 3.2 mmol/L — ABNORMAL LOW (ref 3.5–5.1)
Sodium: 139 mmol/L (ref 135–145)
Total Bilirubin: 0.8 mg/dL (ref 0.3–1.2)
Total Protein: 5.9 g/dL — ABNORMAL LOW (ref 6.5–8.1)

## 2020-04-18 MED ORDER — HEPARIN SOD (PORK) LOCK FLUSH 100 UNIT/ML IV SOLN
INTRAVENOUS | Status: AC
  Filled 2020-04-18: qty 5

## 2020-04-18 MED ORDER — FILGRASTIM-SNDZ 480 MCG/0.8ML IJ SOSY
480.0000 ug | PREFILLED_SYRINGE | Freq: Once | INTRAMUSCULAR | Status: AC
  Administered 2020-04-18: 480 ug via SUBCUTANEOUS
  Filled 2020-04-18: qty 0.8

## 2020-04-18 MED ORDER — HEPARIN SOD (PORK) LOCK FLUSH 100 UNIT/ML IV SOLN
500.0000 [IU] | Freq: Once | INTRAVENOUS | Status: AC
  Administered 2020-04-18: 500 [IU] via INTRAVENOUS
  Filled 2020-04-18: qty 5

## 2020-04-18 MED ORDER — POTASSIUM CHLORIDE ER 10 MEQ PO TBCR: 10.0000 meq | EXTENDED_RELEASE_TABLET | Freq: Every day | ORAL | 0 refills | Status: DC

## 2020-04-18 MED ORDER — SODIUM CHLORIDE 0.9% FLUSH
10.0000 mL | INTRAVENOUS | Status: DC | PRN
  Administered 2020-04-18: 10 mL via INTRAVENOUS
  Filled 2020-04-18: qty 10

## 2020-04-18 MED ORDER — DULOXETINE HCL 30 MG PO CPEP: 30.0000 mg | ORAL_CAPSULE | Freq: Every day | ORAL | 0 refills | Status: DC

## 2020-04-18 NOTE — Progress Notes (Signed)
Patient would like to discuss symptoms that starts during tx and lasts for a few days.  It will start with head feeling "funny" then has lip/mout numbness that does last a few days.  Now has occasional neuropathy in her feet.

## 2020-04-18 NOTE — Progress Notes (Signed)
Hematology/Oncology follow up note [facility] [phone] [phone]   Patient Care Team: Flinchum, Kelby Aline, FNP as PCP - General (Family Medicine) Flinchum, Kelby Aline, FNP (Family Medicine) [name], RN as Oncology Nurse Navigator  REFERRING PROVIDER: Sharmon Leyden*  CHIEF COMPLAINTS/REASON FOR VISIT:  Follow up for treatment of pancreatic adenocarcinoma  HISTORY OF PRESENTING ILLNESS:   Cassandra [name] is a  64 y.o.  female with PMH listed below was seen in consultation at the request of  Flinchum, Kelby Aline, F*  for evaluation of pancreatic adenocarcinoma Patient initially presented with jaundice, transaminitis, bilirubin was 9.9.  CA 19-9 was 1874.  Patient also reports unintentional weight loss. 02/08/2020 MRI abdomen and MRCP with and without contrast was done at [facility] which showed pancreatic head mass measuring up to 3 cm, with marked associated narrowing of the portal vein confluence.  SMA is preserved.  Marked intrahepatic and extrahepatic biliary duct dilatation as well as mild dilatation of the main pancreatic duct. Multiple hepatic masses highly concerning for metastatic disease.  Patient underwent EUS on 02/19/2020, which showed irregular mass identified in the pancreatic head, hypoechoic, measured 43mx33mm, sonographic evidence concerning for invasion into the superior mesenteric artery.  There is no sign of significant abnormality in the main pancreatic duct.  Dilatation of common bile duct which measured up to 16 mm.  Region of celiac artery was visualized and showed no signs of significant abnormality.  No lymphadenopathy.  FNA showed adenocarcinoma.  02/19/2020, ERCP, malignant.  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  The stricture was treated with placement of wall flex metal stent.  [facility] ZPia Mauand was  recommended for 3 drug regimen FOLFIRINOX.  [facility]  Patient was referred to establish care today. She denies any pain.  Since stent placement, skin jaundice has improved.  Itchiness has also improved. Patient was accompanied by her husband today.  She has a family history of breast cancer in sister and paternal aunt, colon cancer paternal grandmother.  INTERVAL HISTORY Cassandra SPOERLis a 64y.o. female who has above history reviewed by me today presents for follow up visit for management of stage IV pancreatic cancer Problems and complaints are listed below: Patient had oxaliplatin and 50% of the irinotecan on day 1 of treatment, she developed slurred speech while she spoke to her husband.  Patient was seen by nurse practitioner JAnderson Maltaand patient was given Benadryl, Solu-Medrol 125 mg IV x1.  Vital signs are normal. I was notified and I saw the patient at the chair side.  Her symptoms is better but not fully resolved.  She feels " thick in my mouth".  Chemotherapy was held.  5-FU pump was not..  Patient was recommended to go to emergency room for evaluation of stroke.  Her symptoms fully resolved in ER.  Patient was seen by neurologist during admission.  She also had a stroke work-up including CT head, CT angiogram head, CTA neck, MRI brain, carotid duplex, echocardiogram.  All within normal limits.  Some mild atherosclerotic plaque formation on CTA neck and a carotid duplex.  Working diagnosis is TIA with slurred speech.  Patient was discharged on aspirin 81 mg and Lipitor 40 mg. Patient was noted to have hypoglycemia, A1c is 5.5. Today patient was seen post hospitalization and evaluation prior to chemotherapy with 5-FU pump. She  denies any additional episodes of slurred speech.  She has not started aspirin 81 mg or Lipitor 40 mg yet. She feels tired today.  Lack of appetite.  Not eating very much.  Weight loss 3 pounds.  #no reportable targetable  mutation on NGS 9/14/2021cycle 1 FOLFIRINOX.  Patient received oxaliplatin and about 50% of Irinotecan on day 1 and had experienced neurologic symptoms.  She went to ER and working diagnosis is TIA.  # 03/19/2020-03/21/2020 patient was admitted due to sepsis with strep pneumonia bacteremia.  Patient was treated with IV Rocephin.  TEE was done which showed no vegetation.  No PFO or ASD.  Patient was discharged home and he finished full course of 14 days of IV Rocephin on 04/02/2020 per ID recommendation.  Repeat blood culture was also negative.   INTERVAL HISTORY Cassandra [name] is a 64 y.o. female who has above history reviewed by me today presents for acute visit.   Problems and complaints are listed below Patient reports feeling tired today.  No fever, chills.  She has developed constant numbness and tingling of fingertips and toes.  She reports her head felt funny and lip/mouth numbness during her treatment and the numbness lasted for a few days and spontaneously resolved..  No nausea vomiting.  Review of Systems  Constitutional: Positive for fatigue. Negative for chills, fever and unexpected weight change.  HENT:   Negative for hearing loss and voice change.   Eyes: Negative for eye problems.  Respiratory: Negative for chest tightness and cough.   Cardiovascular: Negative for chest pain.  Gastrointestinal: Negative for abdominal distention, abdominal pain and blood in stool.  Endocrine: Negative for hot flashes.  Genitourinary: Negative for difficulty urinating and frequency.   Musculoskeletal: Negative for arthralgias.  Skin: Negative for itching and rash.  Neurological: Negative for extremity weakness.  Hematological: Negative for adenopathy.  Psychiatric/Behavioral: Negative for confusion.    MEDICAL HISTORY:  Past Medical History:  Diagnosis Date  . Cancer [facility])    pancreatic cancer  . Colon polyps   . Family history of breast cancer   . Osteopenia after menopause 05/2017   femoral  neck T score -2.0    SURGICAL HISTORY: Past Surgical History:  Procedure Laterality Date  . COLONOSCOPY  07/2015   WNL  . COLONOSCOPY  2008/2011  . OVARIAN CYST REMOVAL  1992   dermoid-Dr Aud[place]on  . PORTA CATH INSERTION N/A 03/07/2020   Procedure: PORTA CATH INSERTION;  Surgeon: Algernon Huxley, MD;  Location: [place] [facility];  Service: Cardiovascular;  Laterality: N/A;  . TEE WITHOUT CARDIOVERSION N/A 03/21/2020   Procedure: TRANSESOPHAGEAL ECHOCARDIOGRAM (TEE);  Surgeon: Dionisio David, MD;  Location: [facility] ORS;  Service: Cardiovascular;  Laterality: N/A;  . TUBAL LIGATION  1993    SOCIAL HISTORY: Social History   Socioeconomic History  . Marital status: Married    Spouse name: Not on file  . Number of children: 2  . Years of education: Not on file  . Highest education level: Not on file  Occupational History  . Occupation: Pharmacist, hospital    Comment: retired  . Occupation: Farmer  Tobacco Use  . Smoking status: Former Smoker    Packs/day: 0.50    Years: 10.00    Pack years: 5.00    Types: Cigarettes    Quit date: 1987    Years since quitting: 34.8  . Smokeless tobacco: Never Used  . Tobacco comment: Quit smoking 1987  Vaping Use  . Vaping Use: Never used  Substance and Sexual Activity  . Alcohol use: Not Currently    Comment: 0-2 mixed drinks a day  . Drug use: No  . Sexual activity: Not Currently    Birth control/protection: Post-menopausal  Other Topics Concern  . Not on file  Social History Narrative  . Not on file   Social Determinants of Health   Financial Resource Strain:   . Difficulty of Paying Living Expenses: Not on file  Food Insecurity:   . Worried About Charity fundraiser in the Last Year: Not on file  . Ran Out of Food in the Last Year: Not on file  Transportation Needs:   . Lack of Transportation (Medical): Not on file  . Lack of Transportation (Non-Medical): Not on file  Physical Activity:   . Days of Exercise per Week: Not on file  . Minutes  of Exercise per Session: Not on file  Stress:   . Feeling of Stress : Not on file  Social Connections:   . Frequency of Communication with Friends and Family: Not on file  . Frequency of Social Gatherings with Friends and Family: Not on file  . Attends Religious Services: Not on file  . Active Member of Clubs or Organizations: Not on file  . Attends Archivist Meetings: Not on file  . Marital Status: Not on file  Intimate Partner Violence:   . Fear of Current or Ex-Partner: Not on file  . Emotionally Abused: Not on file  . Physically Abused: Not on file  . Sexually Abused: Not on file    FAMILY HISTORY: Family History  Problem Relation Age of Onset  . Breast cancer Paternal Aunt 24  . Diabetes Mother   . Osteoporosis Mother   . Hyperlipidemia Father   . Rheumatic fever Father   . Valvular heart disease Father   . Cancer Paternal Grandmother        possible colon  . Breast cancer Sister 24    ALLERGIES:  is allergic to penicillin g.  MEDICATIONS:  Current Outpatient Medications  Medication Sig Dispense Refill  . aspirin EC 81 MG tablet Take 1 tablet (81 mg total) by mouth daily. Swallow whole. 150 tablet 2  . atorvastatin (LIPITOR) 40 MG tablet Take 1 tablet (40 mg total) by mouth daily. 30 tablet 1  . Cholecalciferol 25 MCG (1000 UT) tablet Take 2,000 Units by mouth daily.    Marland Kitchen lidocaine-prilocaine (EMLA) cream Apply to affected area once (Patient taking differently: Apply 1 application topically as directed. ) 30 g 3  . loperamide (IMODIUM A-D) 2 MG tablet Take 2 at onset of diarrhea, then 1 every 2hrs until 12hr without a BM. May take 2 tab every 4hrs at bedtime. If diarrhea recurs repeat. 100 tablet 1  . magic mouthwash w/lidocaine SOLN Take 5 mLs by mouth 4 (four) times daily as needed for mouth pain. Sig: Swish & Spit 5-10 ml four times a day as needed. Dispense 480 ml. 1RF 480 mL 1  . Multiple Vitamins-Minerals (MULTIVITAMIN ADULT PO) Take 1 tablet by mouth  daily.     . ondansetron (ZOFRAN) 8 MG tablet Take 1 tablet (8 mg total) by mouth 2 (two) times daily as needed. Start on day 3 after chemotherapy. 30 tablet 1  . prochlorperazine (COMPAZINE) 10 MG tablet Take 1 tablet (10 mg total) by mouth every 6 (six) hours as needed (Nausea or vomiting). 30 tablet 1  . simethicone (GAS-X) 80 MG chewable tablet Chew 1 tablet (80 mg  total) by mouth every 8 (eight) hours as needed for flatulence. 60 tablet 0   No current facility-administered medications for this visit.     PHYSICAL EXAMINATION: ECOG PERFORMANCE STATUS: 1 - Symptomatic but completely ambulatory Vitals:   04/18/20 0839  BP: 133/86  Pulse: 70  Resp: 18  Temp: 98.7 F (37.1 C)   There were no vitals filed for this visit.  Physical Exam Constitutional:      General: She is not in acute distress. HENT:     Head: Normocephalic and atraumatic.  Eyes:     General: No scleral icterus. Cardiovascular:     Rate and Rhythm: Normal rate and regular rhythm.     Heart sounds: Normal heart sounds.  Pulmonary:     Effort: Pulmonary effort is normal. No respiratory distress.     Breath sounds: No wheezing.  Abdominal:     General: Bowel sounds are normal. There is no distension.     Palpations: Abdomen is soft.  Musculoskeletal:        General: No deformity. Normal range of motion.     Cervical back: Normal range of motion and neck supple.  Skin:    General: Skin is warm and dry.     Coloration: Skin is not jaundiced.     Findings: No erythema or rash.  Neurological:     Mental Status: She is alert and oriented to person, place, and time. Mental status is at baseline.     Cranial Nerves: No cranial nerve deficit.     Coordination: Coordination normal.  Psychiatric:        Mood and Affect: Mood normal.     LABORATORY DATA:  I have reviewed the data as listed Lab Results  Component Value Date   WBC 4.8 04/04/2020   HGB 11.1 (L) 04/04/2020   HCT 31.5 (L) 04/04/2020   MCV 93.8  04/04/2020   PLT 244 04/04/2020   Recent Labs    02/06/20 1049 02/29/20 1314 03/19/20 1024 03/19/20 1024 03/20/20 0411 03/21/20 0318 04/04/20 0827  NA 137   < > 129*   < > 136 140 137  K 4.1   < > 3.8   < > 4.0 3.7 4.1  CL 99   < > 95*   < > 104 108 103  CO2 23   < > 25   < > 25 25 27   GLUCOSE 113*   < > 145*   < > 126* 114* 117*  BUN 11   < > 11   < > 7* 6* 13  CREATININE 0.80   < > 0.85   < > 0.66 0.52 0.56  CALCIUM 10.2   < > 8.9   < > 8.3* 8.2* 8.6*  GFRNONAA 78   < > >60   < > >60 >60 >60  GFRAA 90   < > >60  --  >60 >60  --   PROT 7.0   < > 7.1  --   --  5.3* 6.1*  ALBUMIN 4.9*   < > 3.5  --   --  2.5* 3.7  AST 195*   < > 30  --   --  52* 23  ALT 366*   < > 43  --   --  74* 28  ALKPHOS 934*   < > 125  --   --  112 105  BILITOT 9.9*   < > 1.5*  --   --  1.0 1.0  BILIDIR  8.11*  --   --   --   --   --   --   IBILI 1.79*  --   --   --   --   --   --    < > = values in this interval not displayed.   Iron/TIBC/Ferritin/ %Sat No results found for: IRON, TIBC, FERRITIN, IRONPCTSAT    RADIOGRAPHIC STUDIES: I have personally reviewed the radiological images as listed and agreed with the findings in the report. Reviewed findings of MRI abdomen MRCP done at [facility]. CT ANGIO HEAD W OR WO CONTRAST  Result Date: 03/12/2020 CLINICAL DATA:  Slurred speech.  Pancreatic cancer patient. EXAM: CT ANGIOGRAPHY HEAD AND NECK TECHNIQUE: Multidetector CT imaging of the head and neck was performed using the standard protocol during bolus administration of intravenous contrast. Multiplanar CT image reconstructions and MIPs were obtained to evaluate the vascular anatomy. Carotid stenosis measurements (when applicable) are obtained utilizing NASCET criteria, using the distal internal carotid diameter as the denominator. CONTRAST:  35mL OMNIPAQUE IOHEXOL 350 MG/ML SOLN COMPARISON:  Head CT yesterday. FINDINGS: CT HEAD FINDINGS Brain: No evidence of acute infarction. No accelerated brain atrophy. No  mass, hemorrhage, hydrocephalus or extra-axial collection. Vascular: There is atherosclerotic calcification of the major vessels at the base of the brain. Skull: Negative Sinuses: Clear Orbits: Normal Review of the MIP images confirms the above findings CTA NECK FINDINGS Aortic arch: Aortic atherosclerosis. Branching pattern is normal without origin stenosis. Right carotid system: Common carotid artery widely patent to the bifurcation. Calcified plaque at the carotid bifurcation but no stenosis. ICA bulb widely patent. Cervical ICA is normal. Left carotid system: Common carotid artery widely patent to the bifurcation. Carotid bifurcation is normal without plaque or stenosis. Cervical ICA is normal. Vertebral arteries: Both vertebral arteries widely patent at their origins and through the cervical region to the foramen magnum. Skeleton: Ordinary cervical spondylosis. Other neck: No mass or lymphadenopathy. Upper chest: Normal Review of the MIP images confirms the above findings CTA HEAD FINDINGS Anterior circulation: Both internal carotid arteries widely patent through the skull base and siphon regions. Ordinary siphon atherosclerotic calcification but without stenosis greater than 30%. The anterior and middle cerebral vessels are normal without proximal stenosis, aneurysm or vascular malformation. No large or medium vessel occlusion. Posterior circulation: Both vertebral arteries widely patent to the basilar. No basilar stenosis. Posterior circulation branch vessels are normal. Venous sinuses: Patent and normal. Anatomic variants: None significant. Review of the MIP images confirms the above findings IMPRESSION: 1. Mild atherosclerotic disease at the right carotid bifurcation but without stenosis. Left carotid bifurcation normal. 2. No intracranial large or medium vessel occlusion or correctable proximal stenosis. 3. Aortic atherosclerosis. Aortic Atherosclerosis (ICD10-I70.0). Electronically Signed   By: Nelson Chimes M.D.   On: 03/12/2020 10:46   DG Chest 2 View  Result Date: 03/19/2020 CLINICAL DATA:  Bacteremia, evaluate for pneumonia. EXAM: CHEST - 2 VIEW COMPARISON:  Prior chest CT 03/13/2020. FINDINGS: A right chest infusion port catheter is present with tip terminating in the region of the superior cavoatrial junction. Heart size within normal limits. Aortic atherosclerosis. No appreciable airspace consolidation. No evidence of pleural effusion or pneumothorax. No acute bony abnormality identified. IMPRESSION: No evidence of active cardiopulmonary disease. Aortic Atherosclerosis (ICD10-I70.0). Electronically Signed   By: Kellie Simmering DO   On: 03/19/2020 12:19   CT HEAD WO CONTRAST  Result Date: 03/11/2020 CLINICAL DATA:  Slurred speech EXAM: CT HEAD WITHOUT CONTRAST TECHNIQUE: Contiguous axial images were  obtained from the base of the skull through the vertex without intravenous contrast. COMPARISON:  None. FINDINGS: Brain: No evidence of acute territorial infarction, hemorrhage, hydrocephalus,extra-axial collection or mass lesion/mass effect. Normal gray-white differentiation. Ventricles are normal in size and contour. Vascular: No hyperdense vessel or unexpected calcification. Skull: The skull is intact. No fracture or focal lesion identified. Sinuses/Orbits: The visualized paranasal sinuses and mastoid air cells are clear. The orbits and globes intact. Other: None IMPRESSION: No acute intracranial abnormality. Electronically Signed   By: Prudencio Pair M.D.   On: 03/11/2020 16:48   CT ANGIO NECK W OR WO CONTRAST  Result Date: 03/12/2020 CLINICAL DATA:  Slurred speech.  Pancreatic cancer patient. EXAM: CT ANGIOGRAPHY HEAD AND NECK TECHNIQUE: Multidetector CT imaging of the head and neck was performed using the standard protocol during bolus administration of intravenous contrast. Multiplanar CT image reconstructions and MIPs were obtained to evaluate the vascular anatomy. Carotid stenosis measurements  (when applicable) are obtained utilizing NASCET criteria, using the distal internal carotid diameter as the denominator. CONTRAST:  42mL OMNIPAQUE IOHEXOL 350 MG/ML SOLN COMPARISON:  Head CT yesterday. FINDINGS: CT HEAD FINDINGS Brain: No evidence of acute infarction. No accelerated brain atrophy. No mass, hemorrhage, hydrocephalus or extra-axial collection. Vascular: There is atherosclerotic calcification of the major vessels at the base of the brain. Skull: Negative Sinuses: Clear Orbits: Normal Review of the MIP images confirms the above findings CTA NECK FINDINGS Aortic arch: Aortic atherosclerosis. Branching pattern is normal without origin stenosis. Right carotid system: Common carotid artery widely patent to the bifurcation. Calcified plaque at the carotid bifurcation but no stenosis. ICA bulb widely patent. Cervical ICA is normal. Left carotid system: Common carotid artery widely patent to the bifurcation. Carotid bifurcation is normal without plaque or stenosis. Cervical ICA is normal. Vertebral arteries: Both vertebral arteries widely patent at their origins and through the cervical region to the foramen magnum. Skeleton: Ordinary cervical spondylosis. Other neck: No mass or lymphadenopathy. Upper chest: Normal Review of the MIP images confirms the above findings CTA HEAD FINDINGS Anterior circulation: Both internal carotid arteries widely patent through the skull base and siphon regions. Ordinary siphon atherosclerotic calcification but without stenosis greater than 30%. The anterior and middle cerebral vessels are normal without proximal stenosis, aneurysm or vascular malformation. No large or medium vessel occlusion. Posterior circulation: Both vertebral arteries widely patent to the basilar. No basilar stenosis. Posterior circulation branch vessels are normal. Venous sinuses: Patent and normal. Anatomic variants: None significant. Review of the MIP images confirms the above findings IMPRESSION: 1. Mild  atherosclerotic disease at the right carotid bifurcation but without stenosis. Left carotid bifurcation normal. 2. No intracranial large or medium vessel occlusion or correctable proximal stenosis. 3. Aortic atherosclerosis. Aortic Atherosclerosis (ICD10-I70.0). Electronically Signed   By: Nelson Chimes M.D.   On: 03/12/2020 10:46   CT CHEST W CONTRAST  Result Date: 03/13/2020 CLINICAL DATA:  64 year old female with history of pancreatic cancer. EXAM: CT CHEST WITH CONTRAST TECHNIQUE: Multidetector CT imaging of the chest was performed during intravenous contrast administration. CONTRAST:  49mL OMNIPAQUE IOHEXOL 300 MG/ML  SOLN COMPARISON:  No priors. FINDINGS: Cardiovascular: Heart size is normal. There is no significant pericardial fluid, thickening or pericardial calcification. 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 No definite coronary artery calcifications. Right internal jugular single-lumen porta cath with tip terminating in the superior aspect of the right atrium. Mediastinum/Nodes: No pathologically enlarged mediastinal or hilar lymph nodes. Esophagus is unremarkable in appearance.  No axillary lymphadenopathy. Lungs/Pleura: No suspicious appearing pulmonary nodules or masses are noted. No acute consolidative airspace disease. No pleural effusions. Upper Abdomen: Extensive pneumobilia. Musculoskeletal: There are no aggressive appearing lytic or blastic lesions noted in the visualized portions of the skeleton. IMPRESSION: 1. No findings to suggest metastatic disease to the thorax. 2. 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 Recommend annual imaging followup by CTA or MRA. This recommendation follows 2010 ACCF/AHA/AATS/ACR/ASA/SCA/SCAI/SIR/STS/SVM Guidelines for the Diagnosis and Management of Patients with Thoracic Aortic Disease. Circulation. 2010; 121: [id number]. Aortic aneurysm NOS (ICD10-I71.9). Aortic  Atherosclerosis (ICD10-I70.0). Electronically Signed   By: Vinnie Langton M.D.   On: 03/13/2020 11:59   MR BRAIN WO CONTRAST  Result Date: 03/12/2020 CLINICAL DATA:  Transit ischemic attack.  Slurred speech. EXAM: MRI HEAD WITHOUT CONTRAST TECHNIQUE: Multiplanar, multiecho pulse sequences of the brain and surrounding structures were obtained without intravenous contrast. COMPARISON:  03/11/2020 head CT.  03/12/2020 CTA head and neck. FINDINGS: Brain: No acute infarct or intracranial hemorrhage. Cerebral volume is within normal limits. Minimal chronic microvascular ischemic changes. No midline shift, ventriculomegaly or extra-axial fluid collection. No mass lesion. Vascular: Please see same day CTA. Skull and upper cervical spine: Normal marrow signal. Sinuses/Orbits: Normal orbits. Clear paranasal sinuses. No mastoid effusion. Other: None. IMPRESSION: No acute intracranial process. Minimal chronic microvascular ischemic changes. Electronically Signed   By: Primitivo Gauze M.D.   On: 03/12/2020 11:49   US Carotid Bilateral (at [facility] and [facility] only)  Result Date: 03/12/2020 CLINICAL DATA:  64 year old female with a history of TIA EXAM: BILATERAL CAROTID DUPLEX ULTRASOUND TECHNIQUE: Pearline Cables scale imaging, color Doppler and duplex ultrasound were performed of bilateral carotid and vertebral arteries in the neck. COMPARISON:  None. FINDINGS: Criteria: Quantification of carotid stenosis is based on velocity parameters that correlate the residual internal carotid diameter with NASCET-based stenosis levels, using the diameter of the distal internal carotid lumen as the denominator for stenosis measurement. The following velocity measurements were obtained: RIGHT ICA:  Systolic 829 cm/sec, Diastolic 52 cm/sec CCA:  98 cm/sec SYSTOLIC ICA/CCA RATIO:  1.6 ECA:  170 cm/sec LEFT ICA:  Systolic 89 cm/sec, Diastolic 14 cm/sec CCA:  95 cm/sec SYSTOLIC ICA/CCA RATIO:  0.9 ECA:  119 cm/sec Right Brachial SBP: Not acquired Left  Brachial SBP: Not acquired RIGHT CAROTID ARTERY: No significant calcifications of the right common carotid artery. Intermediate waveform maintained. Heterogeneous and partially calcified plaque at the right carotid bifurcation. No significant lumen shadowing. Low resistance waveform of the right ICA. No significant tortuosity. RIGHT VERTEBRAL ARTERY: Antegrade flow with low resistance waveform. LEFT CAROTID ARTERY: No significant calcifications of the left common carotid artery. Intermediate waveform maintained. Heterogeneous and partially calcified plaque at the left carotid bifurcation without significant lumen shadowing. Low resistance waveform of the left ICA. No significant tortuosity. LEFT VERTEBRAL ARTERY:  Antegrade flow with low resistance waveform. IMPRESSION: Right: Heterogeneous and partially calcified plaque at the right carotid bifurcation, with discordant results regarding degree of stenosis by established duplex criteria. Peak velocity suggests 50%-69% stenosis, with the ICA/ CCA ratio suggesting a lesser degree of stenosis. If establishing a more accurate degree of stenosis is required, cerebral angiogram should be considered, or as a second best test, CTA. Left: Color duplex indicates minimal heterogeneous and calcified plaque, with no hemodynamically significant stenosis by duplex criteria in the extracranial cerebrovascular circulation. Signed, Dulcy Fanny. Dellia Nims, [facility] Radiology Electronically Signed   By: Corrie Mckusick  D.O.   On: 03/12/2020 14:02   PERIPHERAL VASCULAR CATHETERIZATION  Result Date: 03/07/2020 See op note  ECHOCARDIOGRAM COMPLETE  Result Date: 03/12/2020    ECHOCARDIOGRAM REPORT   Patient Name:   Cassandra [name] Date of Exam: 03/12/2020 Medical Rec #:  [id number]   Height:       67.0 in Accession #:    [id number]  Weight:       145.3 lb Date of Birth:  [birth year]   BSA:          1.765 m Patient Age:    22 years    BP:            125/74 mmHg Patient Gender: F           HR:           63 bpm. Exam Location:  [facility] Procedure: 2D Echo, Cardiac Doppler and Color Doppler Indications:     TIA 435.9  History:         Patient has no prior history of Echocardiogram examinations. No                  cardiac hsitory listed.  Sonographer:     Sherrie Sport RDCS (AE) Referring Phys:  [id number] [place] Diagnosing Phys: Kate Sable MD IMPRESSIONS  1. Left ventricular ejection fraction, by estimation, is 65 to 70%. The left ventricle has normal function. The left ventricle has no regional wall motion abnormalities. Left ventricular diastolic parameters were normal.  2. Right ventricular systolic function is normal. The right ventricular size is normal. There is normal pulmonary artery systolic pressure.  3. The mitral valve is normal in structure. No evidence of mitral valve regurgitation. No evidence of mitral stenosis.  4. The aortic valve is normal in structure. Aortic valve regurgitation is not visualized. No aortic stenosis is present.  5. The inferior vena cava is normal in size with greater than 50% respiratory variability, suggesting right atrial pressure of 3 mmHg. FINDINGS  Left Ventricle: Left ventricular ejection fraction, by estimation, is 65 to 70%. The left ventricle has normal function. The left ventricle has no regional wall motion abnormalities. The left ventricular internal cavity size was normal in size. There is  no left ventricular hypertrophy. Left ventricular diastolic parameters were normal. Right Ventricle: The right ventricular size is normal. No increase in right ventricular wall thickness. Right ventricular systolic function is normal. There is normal pulmonary artery systolic pressure. The tricuspid regurgitant velocity is 2.57 m/s, and  with an assumed right atrial pressure of 3 mmHg, the estimated right ventricular systolic pressure is 30.8 mmHg. Left Atrium: Left atrial size was normal in size. Right  Atrium: Right atrial size was normal in size. Pericardium: There is no evidence of pericardial effusion. Mitral Valve: The mitral valve is normal in structure. No evidence of mitral valve regurgitation. No evidence of mitral valve stenosis. Tricuspid Valve: The tricuspid valve is normal in structure. Tricuspid valve regurgitation is not demonstrated. No evidence of tricuspid stenosis. Aortic Valve: The aortic valve is normal in structure. Aortic valve regurgitation is not visualized. No aortic stenosis is present. Aortic valve mean gradient measures 4.5 mmHg. Aortic valve peak gradient measures 7.3 mmHg. Aortic valve area, by VTI measures 3.23 cm. Pulmonic Valve: The pulmonic valve was normal in structure. Pulmonic valve regurgitation is not visualized. No evidence of pulmonic stenosis. Aorta: The aortic root is normal in size and structure. Venous: The inferior vena cava is normal in size with greater  than 50% respiratory variability, suggesting right atrial pressure of 3 mmHg. IAS/Shunts: No atrial level shunt detected by color flow Doppler.  LEFT VENTRICLE PLAX 2D LVIDd:         4.00 cm  Diastology LVIDs:         2.28 cm  LV e' medial:    7.72 cm/s LV PW:         1.50 cm  LV E/e' medial:  10.8 LV IVS:        0.90 cm  LV e' lateral:   11.50 cm/s LVOT diam:     2.00 cm  LV E/e' lateral: 7.3 LV SV:         96 LV SV Index:   54 LVOT Area:     3.14 cm  RIGHT VENTRICLE RV Basal diam:  3.77 cm RV S prime:     19.00 cm/s TAPSE (M-mode): 3.7 cm LEFT ATRIUM             Index       RIGHT ATRIUM           Index LA diam:        2.30 cm 1.30 cm/m  RA Area:     16.70 cm LA Vol (A2C):   45.2 ml 25.60 ml/m RA Volume:   45.40 ml  25.72 ml/m LA Vol (A4C):   35.1 ml 19.88 ml/m LA Biplane Vol: 39.1 ml 22.15 ml/m  AORTIC VALVE                   PULMONIC VALVE AV Area (Vmax):    3.22 cm    PV Vmax:        0.79 m/s AV Area (Vmean):   3.07 cm    PV Peak grad:   2.5 mmHg AV Area (VTI):     3.23 cm    RVOT Peak grad: 4 mmHg AV  Vmax:           135.50 cm/s AV Vmean:          99.250 cm/s AV VTI:            0.297 m AV Peak Grad:      7.3 mmHg AV Mean Grad:      4.5 mmHg LVOT Vmax:         139.00 cm/s LVOT Vmean:        97.100 cm/s LVOT VTI:          0.305 m LVOT/AV VTI ratio: 1.03  AORTA Ao Root diam: 3.50 cm MITRAL VALVE               TRICUSPID VALVE MV Area (PHT): 2.39 cm    TR Peak grad:   26.4 mmHg MV Decel Time: 317 msec    TR Vmax:        257.00 cm/s MV E velocity: 83.40 cm/s MV A velocity: 72.30 cm/s  SHUNTS MV E/A ratio:  1.15        Systemic VTI:  0.30 m                            Systemic Diam: 2.00 cm Kate Sable MD Electronically signed by Kate Sable MD Signature Date/Time: 03/12/2020/4:01:51 PM    Final    ECHO TEE  Result Date: 03/21/2020    TRANSESOPHOGEAL ECHO REPORT   Patient Name:   Cassandra [name] Date of Exam: 03/21/2020 Medical Rec #:  [id number]   Height:  67.0 in Accession #:    [id number]  Weight:       138.0 lb Date of Birth:  [birth year]   BSA:          1.727 m Patient Age:    64 years    BP:           133/83 mmHg Patient Gender: F           HR:           83 bpm. Exam Location:  [facility] Procedure: Transesophageal Echo, Color Doppler, Cardiac Doppler and Saline            Contrast Bubble Study Indications:     Not listed on order  History:         Patient has prior history of Echocardiogram examinations, most                  recent 03/12/2020.  Sonographer:     Sherrie Sport RDCS (AE) Referring Phys:  [place] Diagnosing Phys: Neoma Laming MD PROCEDURE: The transesophogeal probe was passed without difficulty through the esophogus of the patient. Local oropharyngeal anesthetic was provided with Benzocaine spray. Sedation performed by performing physician. The patient's vital signs; including heart rate, blood pressure, and oxygen saturation; remained stable throughout the procedure. The patient developed no complications during the procedure. IMPRESSIONS  1. Left ventricular ejection fraction, by  estimation, is 60 to 65%. The left ventricle has normal function. The left ventricle has no regional wall motion abnormalities.  2. Right ventricular systolic function is normal. The right ventricular size is normal.  3. No left atrial/left atrial appendage thrombus was detected.  4. The mitral valve is normal in structure. Mild mitral valve regurgitation. No evidence of mitral stenosis.  5. The aortic valve is abnormal. Aortic valve regurgitation is mild. No aortic stenosis is present.  6. The inferior vena cava is normal in size with greater than 50% respiratory variability, suggesting right atrial pressure of 3 mmHg.  7. Agitated saline contrast bubble study was negative, with no evidence of any interatrial shunt. Conclusion(s)/Recommendation(s): No evidence of vegetation/infective endocarditis on this transesophageal echocardiogram. FINDINGS  Left Ventricle: Left ventricular ejection fraction, by estimation, is 60 to 65%. The left ventricle has normal function. The left ventricle has no regional wall motion abnormalities. The left ventricular internal cavity size was normal in size. There is  no left ventricular hypertrophy. Right Ventricle: The right ventricular size is normal. No increase in right ventricular wall thickness. Right ventricular systolic function is normal. Left Atrium: Left atrial size was normal in size. No left atrial/left atrial appendage thrombus was detected. Right Atrium: Right atrial size was normal in size. Pericardium: There is no evidence of pericardial effusion. Mitral Valve: The mitral valve is normal in structure. Mild mitral valve regurgitation. No evidence of mitral valve stenosis. Tricuspid Valve: The tricuspid valve is normal in structure. Tricuspid valve regurgitation is mild . No evidence of tricuspid stenosis. Aortic Valve: The aortic valve is abnormal. Aortic valve regurgitation is mild. No aortic stenosis is present. Pulmonic Valve: The pulmonic valve was normal in  structure. Pulmonic valve regurgitation is trivial. No evidence of pulmonic stenosis. Aorta: The aortic root is normal in size and structure. Venous: The inferior vena cava is normal in size with greater than 50% respiratory variability, suggesting right atrial pressure of 3 mmHg. IAS/Shunts: No atrial level shunt detected by color flow Doppler. Agitated saline contrast was given intravenously to evaluate for intracardiac shunting.  Agitated saline contrast bubble study was negative, with no evidence of any interatrial shunt. There  is no evidence of a patent foramen ovale. No ventricular septal defect is seen or detected. There is no evidence of an atrial septal defect. Neoma Laming MD Electronically signed by Neoma Laming MD Signature Date/Time: 03/21/2020/8:39:59 AM    Final      ASSESSMENT & PLAN:  1. Primary pancreatic cancer ([place])   2. Encounter for antineoplastic chemotherapy   3. Chemotherapy-induced neutropenia   4. 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 Labs are reviewed and discussed with patient. Hold today's treatment due to neutropenia.  See below.  Neutropenia, chemotherapy-induced. Patient will receive G-CSF support with Zaxio 480 MCG x1 today.  Repeat CBC on 04/21/2020 and if ANC is less than 1 will receive another dose.  Discussed about the rationale and potential side effects of G-CSF support.  Recommend Claritin daily for 4 days. plan to add on pro on day 3 for cycle 3 FOLFIRINOX.  #Weight loss, refer to nutritionist.  Weight is stable.  Continue nutrition supplements. #Hypokalemia, potassium 3.2, recommend patient to take potassium chloride 10 mEq daily for 1 week.  Prescription sent to pharmacy. Supportive care measures are necessary for patient  well-being and will be provided as necessary. We spent sufficient time to discuss many aspect of care, questions were answered to patient's satisfaction.   All questions were answered. The patient knows to call the clinic with any problems questions or concerns.  Return of visit: Follow-up in 1 week for the next chemotherapy treatment.   [name], MD, PhD Hematology Oncology [facility] Pager- [phone] 04/18/2020

## 2020-04-18 NOTE — Progress Notes (Signed)
Per Benjamine Mola, RN, per Dr. Tasia Catchings, patient not receiving treatment today due to [place] 0.7. Patient to receive Zarxio upon insurance approval.

## 2020-04-18 NOTE — Progress Notes (Signed)
Peer to peer completed on patient's behalf for [place]. Approved [id number] Date 04/18/20-09/16/20 Primary nursing team notified.

## 2020-04-19 LAB — CANCER ANTIGEN 19-9: CA 19-9: 355 U/mL — ABNORMAL HIGH (ref 0–35)

## 2020-04-21 ENCOUNTER — Other Ambulatory Visit: Payer: Self-pay

## 2020-04-21 ENCOUNTER — Inpatient Hospital Stay: Payer: BC Managed Care – PPO

## 2020-04-21 ENCOUNTER — Other Ambulatory Visit: Payer: Self-pay | Admitting: Oncology

## 2020-04-21 ENCOUNTER — Telehealth: Payer: Self-pay

## 2020-04-21 DIAGNOSIS — C259 Malignant neoplasm of pancreas, unspecified: Secondary | ICD-10-CM

## 2020-04-21 DIAGNOSIS — Z5111 Encounter for antineoplastic chemotherapy: Secondary | ICD-10-CM | POA: Diagnosis not present

## 2020-04-21 LAB — COMPREHENSIVE METABOLIC PANEL
ALT: 44 U/L (ref 0–44)
AST: 33 U/L (ref 15–41)
Albumin: 3.6 g/dL (ref 3.5–5.0)
Alkaline Phosphatase: 126 U/L (ref 38–126)
Anion gap: 6 (ref 5–15)
BUN: 9 mg/dL (ref 8–23)
CO2: 28 mmol/L (ref 22–32)
Calcium: 8.3 mg/dL — ABNORMAL LOW (ref 8.9–10.3)
Chloride: 107 mmol/L (ref 98–111)
Creatinine, Ser: 0.7 mg/dL (ref 0.44–1.00)
GFR, Estimated: >60 mL/min (ref >60)
Glucose, Bld: 96 mg/dL (ref 70–99)
Potassium: 4.1 mmol/L (ref 3.5–5.1)
Sodium: 141 mmol/L (ref 135–145)
Total Bilirubin: 0.6 mg/dL (ref 0.3–1.2)
Total Protein: 5.9 g/dL — ABNORMAL LOW (ref 6.5–8.1)

## 2020-04-21 LAB — CBC WITH DIFFERENTIAL/PLATELET
Abs Immature Granulocytes: 0.15 10*3/uL — ABNORMAL HIGH (ref 0.00–0.07)
Basophils Absolute: 0.1 10*3/uL (ref 0.0–0.1)
Basophils Relative: 1 %
Eosinophils Absolute: 0.1 10*3/uL (ref 0.0–0.5)
Eosinophils Relative: 2 %
HCT: 30.2 % — ABNORMAL LOW (ref 36.0–46.0)
Hemoglobin: 10.6 g/dL — ABNORMAL LOW (ref 12.0–15.0)
Immature Granulocytes: 3 %
Lymphocytes Relative: 37 %
Lymphs Abs: 2.2 10*3/uL (ref 0.7–4.0)
MCH: 33.4 pg (ref 26.0–34.0)
MCHC: 35.1 g/dL (ref 30.0–36.0)
MCV: 95.3 fL (ref 80.0–100.0)
Monocytes Absolute: 0.8 10*3/uL (ref 0.1–1.0)
Monocytes Relative: 14 %
Neutro Abs: 2.6 10*3/uL (ref 1.7–7.7)
Neutrophils Relative %: 43 %
Platelets: 191 10*3/uL (ref 150–400)
RBC: 3.17 MIL/uL — ABNORMAL LOW (ref 3.87–5.11)
RDW: 14.9 % (ref 11.5–15.5)
Smear Review: NORMAL
WBC: 5.9 10*3/uL (ref 4.0–10.5)
nRBC: 0 % (ref 0.0–0.2)

## 2020-04-21 NOTE — Telephone Encounter (Addendum)
Pt scheduled for treatment on 10/29. Please schedule Udenyca on day 5 as requested by MD (24 hours after pump dc).  We will give her appt when she comes in for treatment.

## 2020-04-21 NOTE — Telephone Encounter (Signed)
-----   Message from [name], MD sent at 04/21/2020  1:04 PM EDT ----- Regarding: RE: Herminio Heads I have changed to Day 5 Udenyca.  Benjamine Mola, please change her schedule message accordingly.  ----- Message ----- From: Floy Sabina Sent: 04/21/2020   9:29 AM EDT To: Evelina Dun, RN, [name], MD Subject: Thermon Leyland                                 Dr. Tasia Catchings,  Okey Regal is not the preferred drug for her insurance plan.  She will either need to get Udenyca or Ziextenzo.  Please change so I can get authorized.   Thanks, Lu

## 2020-04-22 NOTE — Telephone Encounter (Signed)
Done Pt has been scheduled for Udenyca on day 5   Udenyca Inj is scheduled for 04/29/20 @ 2:30pm

## 2020-04-25 ENCOUNTER — Telehealth: Payer: Self-pay | Admitting: *Deleted

## 2020-04-25 ENCOUNTER — Inpatient Hospital Stay: Payer: BC Managed Care – PPO

## 2020-04-25 ENCOUNTER — Encounter: Payer: Self-pay | Admitting: Oncology

## 2020-04-25 ENCOUNTER — Inpatient Hospital Stay (HOSPITAL_BASED_OUTPATIENT_CLINIC_OR_DEPARTMENT_OTHER): Payer: BC Managed Care – PPO | Admitting: Oncology

## 2020-04-25 ENCOUNTER — Other Ambulatory Visit: Payer: Self-pay

## 2020-04-25 VITALS — BP 115/80 | HR 68 | Temp 97.9°F | Resp 16 | Wt 137.7 lb

## 2020-04-25 VITALS — BP 110/75 | HR 66 | Resp 20

## 2020-04-25 DIAGNOSIS — Z5111 Encounter for antineoplastic chemotherapy: Secondary | ICD-10-CM | POA: Diagnosis not present

## 2020-04-25 DIAGNOSIS — D709 Neutropenia, unspecified: Secondary | ICD-10-CM

## 2020-04-25 DIAGNOSIS — C259 Malignant neoplasm of pancreas, unspecified: Secondary | ICD-10-CM

## 2020-04-25 DIAGNOSIS — T451X5A Adverse effect of antineoplastic and immunosuppressive drugs, initial encounter: Secondary | ICD-10-CM | POA: Insufficient documentation

## 2020-04-25 DIAGNOSIS — R471 Dysarthria and anarthria: Secondary | ICD-10-CM | POA: Diagnosis not present

## 2020-04-25 DIAGNOSIS — G620 Drug-induced polyneuropathy: Secondary | ICD-10-CM

## 2020-04-25 DIAGNOSIS — R200 Anesthesia of skin: Secondary | ICD-10-CM | POA: Insufficient documentation

## 2020-04-25 LAB — CBC WITH DIFFERENTIAL/PLATELET
Abs Immature Granulocytes: 0.09 10*3/uL — ABNORMAL HIGH (ref 0.00–0.07)
Basophils Absolute: 0 10*3/uL (ref 0.0–0.1)
Basophils Relative: 1 %
Eosinophils Absolute: 0.1 10*3/uL (ref 0.0–0.5)
Eosinophils Relative: 2 %
HCT: 32.8 % — ABNORMAL LOW (ref 36.0–46.0)
Hemoglobin: 11.5 g/dL — ABNORMAL LOW (ref 12.0–15.0)
Immature Granulocytes: 1 %
Lymphocytes Relative: 28 %
Lymphs Abs: 1.9 10*3/uL (ref 0.7–4.0)
MCH: 33.6 pg (ref 26.0–34.0)
MCHC: 35.1 g/dL (ref 30.0–36.0)
MCV: 95.9 fL (ref 80.0–100.0)
Monocytes Absolute: 0.8 10*3/uL (ref 0.1–1.0)
Monocytes Relative: 12 %
Neutro Abs: 3.9 10*3/uL (ref 1.7–7.7)
Neutrophils Relative %: 56 %
Platelets: 225 10*3/uL (ref 150–400)
RBC: 3.42 MIL/uL — ABNORMAL LOW (ref 3.87–5.11)
RDW: 14.7 % (ref 11.5–15.5)
WBC: 6.8 10*3/uL (ref 4.0–10.5)
nRBC: 0 % (ref 0.0–0.2)

## 2020-04-25 LAB — COMPREHENSIVE METABOLIC PANEL
ALT: 38 U/L (ref 0–44)
AST: 28 U/L (ref 15–41)
Albumin: 3.8 g/dL (ref 3.5–5.0)
Alkaline Phosphatase: 131 U/L — ABNORMAL HIGH (ref 38–126)
Anion gap: 8 (ref 5–15)
BUN: 11 mg/dL (ref 8–23)
CO2: 26 mmol/L (ref 22–32)
Calcium: 8.8 mg/dL — ABNORMAL LOW (ref 8.9–10.3)
Chloride: 104 mmol/L (ref 98–111)
Creatinine, Ser: 0.68 mg/dL (ref 0.44–1.00)
GFR, Estimated: >60 mL/min (ref >60)
Glucose, Bld: 103 mg/dL — ABNORMAL HIGH (ref 70–99)
Potassium: 4.1 mmol/L (ref 3.5–5.1)
Sodium: 138 mmol/L (ref 135–145)
Total Bilirubin: 0.6 mg/dL (ref 0.3–1.2)
Total Protein: 6.4 g/dL — ABNORMAL LOW (ref 6.5–8.1)

## 2020-04-25 MED ORDER — SODIUM CHLORIDE 0.9 % IV SOLN
2400.0000 mg/m2 | INTRAVENOUS | Status: DC
  Administered 2020-04-25: 4200 mg via INTRAVENOUS
  Filled 2020-04-25: qty 84

## 2020-04-25 MED ORDER — SODIUM CHLORIDE 0.9 % IV SOLN
150.0000 mg/m2 | Freq: Once | INTRAVENOUS | Status: AC
  Administered 2020-04-25: 260 mg via INTRAVENOUS
  Filled 2020-04-25: qty 10

## 2020-04-25 MED ORDER — SODIUM CHLORIDE 0.9 % IV SOLN
700.0000 mg | Freq: Once | INTRAVENOUS | Status: AC
  Administered 2020-04-25: 700 mg via INTRAVENOUS
  Filled 2020-04-25: qty 10

## 2020-04-25 MED ORDER — OXALIPLATIN CHEMO INJECTION 100 MG/20ML
85.0000 mg/m2 | Freq: Once | INTRAVENOUS | Status: AC
  Administered 2020-04-25: 150 mg via INTRAVENOUS
  Filled 2020-04-25: qty 10

## 2020-04-25 MED ORDER — DEXTROSE 5 % IV SOLN
Freq: Once | INTRAVENOUS | Status: AC
  Filled 2020-04-25: qty 250

## 2020-04-25 MED ORDER — SODIUM CHLORIDE 0.9 % IV SOLN
10.0000 mg | Freq: Once | INTRAVENOUS | Status: AC
  Administered 2020-04-25: 10 mg via INTRAVENOUS
  Filled 2020-04-25: qty 10

## 2020-04-25 MED ORDER — ATROPINE SULFATE 1 MG/ML IJ SOLN
0.5000 mg | Freq: Once | INTRAMUSCULAR | Status: AC | PRN
  Administered 2020-04-25: 0.5 mg via INTRAVENOUS
  Filled 2020-04-25: qty 1

## 2020-04-25 MED ORDER — DIPHENHYDRAMINE HCL 25 MG PO CAPS
50.0000 mg | ORAL_CAPSULE | Freq: Once | ORAL | Status: AC
  Administered 2020-04-25: 50 mg via ORAL
  Filled 2020-04-25: qty 2

## 2020-04-25 MED ORDER — DIPHENHYDRAMINE HCL 50 MG/ML IJ SOLN
50.0000 mg | Freq: Once | INTRAMUSCULAR | Status: AC | PRN
  Administered 2020-04-25: 25 mg via INTRAVENOUS

## 2020-04-25 MED ORDER — PALONOSETRON HCL INJECTION 0.25 MG/5ML
0.2500 mg | Freq: Once | INTRAVENOUS | Status: AC
  Administered 2020-04-25: 0.25 mg via INTRAVENOUS
  Filled 2020-04-25: qty 5

## 2020-04-25 MED ORDER — SODIUM CHLORIDE 0.9 % IV SOLN
150.0000 mg | Freq: Once | INTRAVENOUS | Status: AC
  Administered 2020-04-25: 150 mg via INTRAVENOUS
  Filled 2020-04-25: qty 5

## 2020-04-25 NOTE — Progress Notes (Signed)
Patient feels "gassy" and notices an oily consistency in toilet water.  She has not started Cymbalta because the neuropathy is stable.

## 2020-04-25 NOTE — Telephone Encounter (Signed)
Ok to take

## 2020-04-25 NOTE — Progress Notes (Signed)
1315-Patient complaining of upper lip tingling/numbness 34minutes into Irinotecan infusion. Infusion stopped, Beckey Rutter NP notified. VS stable.   Restarted Irinotecan at 50% less rate at 1350. At 1405 patient began complaining of right foot numbness. Irinotecan stopped, VS remain stable, 25mg  IV Benadryl given.   1430- Symptoms resolved, restarted Irinotecan, will continue to monitor  1530-Patient tolerating infusion well

## 2020-04-25 NOTE — Telephone Encounter (Signed)
Patient called reporting that she has "a bit of a sore throat" since getting home from her chemo treatment today and that it feels like she is getting a bit of a cold and is asking if she does develop a cold what can she take for it, is Sudafed alright to take. Please return call to patient.

## 2020-04-25 NOTE — Progress Notes (Addendum)
Hematology/Oncology follow up note [facility] [phone] [phone]   Patient Care Team: Flinchum, Kelby Aline, FNP as PCP - General (Family Medicine) Flinchum, Kelby Aline, FNP (Family Medicine) [name], RN as Oncology Nurse Navigator [name], MD as Consulting Physician (Oncology)  REFERRING PROVIDER: Sharmon Leyden*  CHIEF COMPLAINTS/REASON FOR VISIT:  Follow up for treatment of pancreatic adenocarcinoma  HISTORY OF PRESENTING ILLNESS:   Cassandra [name] is a  64 y.o.  female with PMH listed below was seen in consultation at the request of  Flinchum, Michelle S, F*  for evaluation of pancreatic adenocarcinoma Patient initially presented with jaundice, transaminitis, bilirubin was 9.9.  CA 19-9 was 1874.  Patient also reports unintentional weight loss. 02/08/2020 MRI abdomen and MRCP with and without contrast was done at [facility] which showed pancreatic head mass measuring up to 3 cm, with marked associated narrowing of the portal vein confluence.  SMA is preserved.  Marked intrahepatic and extrahepatic biliary duct dilatation as well as mild dilatation of the main pancreatic duct. Multiple hepatic masses highly concerning for metastatic disease.  Patient underwent EUS on 02/19/2020, which showed irregular mass identified in the pancreatic head, hypoechoic, measured 62mmx33mm, sonographic evidence concerning for invasion into the superior mesenteric artery.  There is no sign of significant abnormality in the main pancreatic duct.  Dilatation of common bile duct which measured up to 16 mm.  Region of celiac artery was visualized and showed no signs of significant abnormality.  No lymphadenopathy.  FNA showed adenocarcinoma.  02/19/2020, ERCP, malignant.  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  The stricture was treated with placement of wall flex metal stent.  Patient was  seen by [facility] oncology Dr. Pia Mau and was recommended for 3 drug regimen FOLFIRINOX.  Patient prefers to do chemotherapy locally at [facility].  Patient was referred to establish care today. She denies any pain.  Since stent placement, skin jaundice has improved.  Itchiness has also improved. Patient was accompanied by her husband today.  She has a family history of breast cancer in sister and paternal aunt, colon cancer paternal grandmother.  INTERVAL HISTORY Cassandra [name] is a 63 y.o. female who has above history reviewed by me today presents for follow up visit for management of stage IV pancreatic cancer Problems and complaints are listed below: Patient had oxaliplatin and 50% of the irinotecan on day 1 of treatment, she developed slurred speech while she spoke to her husband.  Patient was seen by nurse practitioner Anderson Malta and patient was given Benadryl, Solu-Medrol 125 mg IV x1.  Vital signs are normal. I was notified and I saw the patient at the chair side.  Her symptoms is better but not fully resolved.  She feels " thick in my mouth".  Chemotherapy was held.  5-FU pump was not..  Patient was recommended to go to emergency room for evaluation of stroke.  Her symptoms fully resolved in ER.  Patient was seen by neurologist during admission.  She also had a stroke work-up including CT head, CT angiogram head, CTA neck, MRI brain, carotid duplex, echocardiogram.  All within normal limits.  Some mild atherosclerotic plaque formation on CTA neck and a carotid duplex.  Working diagnosis is TIA with slurred speech.  Patient was discharged on aspirin 81 mg and Lipitor 40 mg. Patient was noted to have hypoglycemia, A1c is 5.5. Today patient was seen post hospitalization and evaluation  prior to chemotherapy with 5-FU pump. She denies any additional episodes of slurred speech.  She has not started aspirin 81 mg or Lipitor 40 mg yet. She feels tired today.  Lack of appetite.  Not eating very much.  Weight loss 3  pounds.  #no reportable targetable mutation on NGS 9/14/2021cycle 1 FOLFIRINOX.  Patient received oxaliplatin and about 50% of Irinotecan on day 1 and had experienced neurologic symptoms.  She went to ER and working diagnosis is TIA.  # 03/19/2020-03/21/2020 patient was admitted due to sepsis with strep pneumonia bacteremia.  Patient was treated with IV Rocephin.  TEE was done which showed no vegetation.  No PFO or ASD.  Patient was discharged home and he finished full course of 14 days of IV Rocephin on 04/02/2020 per ID recommendation.  Repeat blood culture was also negative.   INTERVAL HISTORY Cassandra [name] is a 64 y.o. female who has above history reviewed by me today presents for acute visit.   Problems and complaints are listed below Patient reports feeling well today .  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 She also reports lip numbness usually triggered by her chemotherapy and symptoms lasted 3 to 4 hours and resolved.  She also took Benadryl the next day when she felt another recurrence of similar symptoms and the symptoms went away. Reports feels gassy and oral constant stool floating in the toilet. Denies any nausea vomiting diarrhea.  Review of Systems  Constitutional: Positive for fatigue. Negative for chills, fever and unexpected weight change.  HENT:   Negative for hearing loss and voice change.   Eyes: Negative for eye problems.  Respiratory: Negative for chest tightness and cough.   Cardiovascular: Negative for chest pain.  Gastrointestinal: Negative for abdominal distention, abdominal pain and blood in stool.  Endocrine: Negative for hot flashes.  Genitourinary: Negative for difficulty urinating and frequency.   Musculoskeletal: Negative for arthralgias.  Skin: Negative for itching and rash.  Neurological:  Positive for numbness. Negative for extremity weakness.       Lip numbness  Hematological: Negative for adenopathy.  Psychiatric/Behavioral: Negative for confusion.    MEDICAL HISTORY:  Past Medical History:  Diagnosis Date  . Cancer [facility])    pancreatic cancer  . Colon polyps   . Family history of breast cancer   . 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 Osteopenia after menopause 05/2017   femoral neck T score -2.0    SURGICAL HISTORY: Past Surgical History:  Procedure Laterality Date  . COLONOSCOPY  07/2015   WNL  . COLONOSCOPY  2008/2011  . OVARIAN CYST REMOVAL  1992   dermoid-Dr Turon  . PORTA CATH INSERTION N/A 03/07/2020   Procedure: PORTA CATH INSERTION;  Surgeon: Algernon Huxley, MD;  Location: [facility];  Service: Cardiovascular;  Laterality: N/A;  . TEE WITHOUT CARDIOVERSION N/A 03/21/2020   Procedure: TRANSESOPHAGEAL ECHOCARDIOGRAM (TEE);  Surgeon: Dionisio David, MD;  Location: [facility] ORS;  Service: Cardiovascular;  Laterality: N/A;  . TUBAL LIGATION  1993    SOCIAL HISTORY: Social History   Socioeconomic History  . Marital status: Married    Spouse name: Not on file  . Number of children: 2  . Years of education: Not on file  . Highest education level: Not on file  Occupational History  . Occupation: Pharmacist, hospital    Comment:  retired  . Occupation: Farmer  Tobacco Use  . Smoking status: Former Smoker    Packs/day: 0.50    Years: 10.00    Pack years: 5.00    Types: Cigarettes    Quit date: 1987    Years since quitting: 34.8  . Smokeless tobacco: Never Used  . Tobacco comment: Quit smoking 1987  Vaping Use  . Vaping Use: Never used  Substance and Sexual Activity  . Alcohol use: Not Currently    Comment: 0-2 mixed drinks a day  . Drug use: No  . Sexual activity: Not Currently    Birth control/protection: Post-menopausal  Other Topics Concern  . Not on file  Social History Narrative  . Not on file   Social Determinants of Health   Financial  Resource Strain:   . Difficulty of Paying Living Expenses: Not on file  Food Insecurity:   . Worried About Charity fundraiser in the Last Year: Not on file  . Ran Out of Food in the Last Year: Not on file  Transportation Needs:   . Lack of Transportation (Medical): Not on file  . Lack of Transportation (Non-Medical): Not on file  Physical Activity:   . Days of Exercise per Week: Not on file  . Minutes of Exercise per Session: Not on file  Stress:   . Feeling of Stress : Not on file  Social Connections:   . Frequency of Communication with Friends and Family: Not on file  . Frequency of Social Gatherings with Friends and Family: Not on file  . Attends Religious Services: Not on file  . Active Member of Clubs or Organizations: Not on file  . Attends Archivist Meetings: Not on file  . Marital Status: Not on file  Intimate Partner Violence:   . Fear of Current or Ex-Partner: Not on file  . Emotionally Abused: Not on file  . Physically Abused: Not on file  . Sexually Abused: Not on file    FAMILY HISTORY: Family History  Problem Relation Age of Onset  . Breast cancer Paternal Aunt 57  . Diabetes Mother   . Osteoporosis Mother   . Hyperlipidemia Father   . Rheumatic fever Father   . Valvular heart disease Father   . Cancer Paternal Grandmother        possible colon  . Breast cancer Sister 48    ALLERGIES:  is allergic to penicillin g.  MEDICATIONS:  Current Outpatient Medications  Medication Sig Dispense Refill  . aspirin EC 81 MG tablet Take 1 tablet (81 mg total) by mouth daily. Swallow whole. 150 tablet 2  . atorvastatin (LIPITOR) 40 MG tablet Take 1 tablet (40 mg total) by mouth daily. 30 tablet 1  . Cholecalciferol 25 MCG (1000 UT) tablet Take 2,000 Units by mouth daily.    . DULoxetine (CYMBALTA) 30 MG capsule Take 1 capsule (30 mg total) by mouth daily. 30 capsule 0  . lidocaine-prilocaine (EMLA) cream Apply to affected area once (Patient taking  differently: Apply 1 application topically as directed. ) 30 g 3  . loperamide (IMODIUM A-D) 2 MG tablet Take 2 at onset of diarrhea, then 1 every 2hrs until 12hr without a BM. May take 2 tab every 4hrs at bedtime. If diarrhea recurs repeat. 100 tablet 1  . magic mouthwash w/lidocaine SOLN Take 5 mLs by mouth 4 (four) times daily as needed for mouth pain. Sig: Swish & Spit 5-10 ml four times a day as needed. Dispense 480 ml.  1RF 480 mL 1  . Multiple Vitamins-Minerals (MULTIVITAMIN ADULT PO) Take 1 tablet by mouth daily.     . ondansetron (ZOFRAN) 8 MG tablet Take 1 tablet (8 mg total) by mouth 2 (two) times daily as needed. Start on day 3 after chemotherapy. 30 tablet 1  . potassium chloride (KLOR-CON) 10 MEQ tablet Take 1 tablet (10 mEq total) by mouth daily. 7 tablet 0  . prochlorperazine (COMPAZINE) 10 MG tablet Take 1 tablet (10 mg total) by mouth every 6 (six) hours as needed (Nausea or vomiting). 30 tablet 1  . simethicone (GAS-X) 80 MG chewable tablet Chew 1 tablet (80 mg total) by mouth every 8 (eight) hours as needed for flatulence. 60 tablet 0   No current facility-administered medications for this visit.     PHYSICAL EXAMINATION: ECOG PERFORMANCE STATUS: 1 - Symptomatic but completely ambulatory Vitals:   04/25/20 0836  BP: 115/80  Pulse: 68  Resp: 16  Temp: 97.9 F (36.6 C)   Filed Weights   04/25/20 0836  Weight: 137 lb 11.2 oz (62.5 kg)    Physical Exam Constitutional:      General: She is not in acute distress. HENT:     Head: Normocephalic and atraumatic.  Eyes:     General: No scleral icterus. Cardiovascular:     Rate and Rhythm: Normal rate and regular rhythm.     Heart sounds: Normal heart sounds.  Pulmonary:     Effort: Pulmonary effort is normal. No respiratory distress.     Breath sounds: No wheezing.  Abdominal:     General: Bowel sounds are normal. There is no distension.     Palpations: Abdomen is soft.  Musculoskeletal:        General: No  deformity. Normal range of motion.     Cervical back: Normal range of motion and neck supple.  Skin:    General: Skin is warm and dry.     Coloration: Skin is not jaundiced.     Findings: No erythema or rash.  Neurological:     Mental Status: She is alert and oriented to person, place, and time. Mental status is at baseline.     Cranial Nerves: No cranial nerve deficit.     Coordination: Coordination normal.  Psychiatric:        Mood and Affect: Mood normal.     LABORATORY DATA:  I have reviewed the data as listed Lab Results  Component Value Date   WBC 5.9 04/21/2020   HGB 10.6 (L) 04/21/2020   HCT 30.2 (L) 04/21/2020   MCV 95.3 04/21/2020   PLT 191 04/21/2020   Recent Labs    02/06/20 1049 02/29/20 1314 03/19/20 1024 03/19/20 1024 03/20/20 0411 03/21/20 0318 03/21/20 0318 04/04/20 0827 04/18/20 0757 04/21/20 1439  NA 137   < > 129*   < > 136 140   < > 137 139 141  K 4.1   < > 3.8   < > 4.0 3.7   < > 4.1 3.2* 4.1  CL 99   < > 95*   < > 104 108   < > 103 107 107  CO2 23   < > 25   < > 25 25   < > 27 25 28   GLUCOSE 113*   < > 145*   < > 126* 114*   < > 117* 161* 96  BUN 11   < > 11   < > 7* 6*   < > 13 11  9  CREATININE 0.80   < > 0.85   < > 0.66 0.52   < > 0.56 0.65 0.70  CALCIUM 10.2   < > 8.9   < > 8.3* 8.2*   < > 8.6* 8.4* 8.3*  GFRNONAA 78   < > >60   < > >60 >60   < > >60 >60 >60  GFRAA 90   < > >60  --  >60 >60  --   --   --   --   PROT 7.0   < > 7.1   < >  --  5.3*   < > 6.1* 5.9* 5.9*  ALBUMIN 4.9*   < > 3.5   < >  --  2.5*   < > 3.7 3.5 3.6  AST 195*   < > 30   < >  --  52*   < > 23 28 33  ALT 366*   < > 43   < >  --  74*   < > 28 35 44  ALKPHOS 934*   < > 125   < >  --  112   < > 105 103 126  BILITOT 9.9*   < > 1.5*   < >  --  1.0   < > 1.0 0.8 0.6  BILIDIR 8.11*  --   --   --   --   --   --   --   --   --   IBILI 1.79*  --   --   --   --   --   --   --   --   --    < > = values in this interval not displayed.   Iron/TIBC/Ferritin/ %Sat No results  found for: IRON, TIBC, FERRITIN, IRONPCTSAT    RADIOGRAPHIC STUDIES: I have personally reviewed the radiological images as listed and agreed with the findings in the report. Reviewed findings of MRI abdomen MRCP done at [facility]. CT ANGIO HEAD W OR WO CONTRAST  Result Date: 03/12/2020 CLINICAL DATA:  Slurred speech.  Pancreatic cancer patient. EXAM: CT ANGIOGRAPHY HEAD AND NECK TECHNIQUE: Multidetector CT imaging of the head and neck was performed using the standard protocol during bolus administration of intravenous contrast. Multiplanar CT image reconstructions and MIPs were obtained to evaluate the vascular anatomy. Carotid stenosis measurements (when applicable) are obtained utilizing NASCET criteria, using the distal internal carotid diameter as the denominator. CONTRAST:  70mL OMNIPAQUE IOHEXOL 350 MG/ML SOLN COMPARISON:  Head CT yesterday. FINDINGS: CT HEAD FINDINGS Brain: No evidence of acute infarction. No accelerated brain atrophy. No mass, hemorrhage, hydrocephalus or extra-axial collection. Vascular: There is atherosclerotic calcification of the major vessels at the base of the brain. Skull: Negative Sinuses: Clear Orbits: Normal Review of the MIP images confirms the above findings CTA NECK FINDINGS Aortic arch: Aortic atherosclerosis. Branching pattern is normal without origin stenosis. Right carotid system: Common carotid artery widely patent to the bifurcation. Calcified plaque at the carotid bifurcation but no stenosis. ICA bulb widely patent. Cervical ICA is normal. Left carotid system: Common carotid artery widely patent to the bifurcation. Carotid bifurcation is normal without plaque or stenosis. Cervical ICA is normal. Vertebral arteries: Both vertebral arteries widely patent at their origins and through the cervical region to the foramen magnum. Skeleton: Ordinary cervical spondylosis. Other neck: No mass or lymphadenopathy. Upper chest: Normal Review of the MIP images confirms the above  findings CTA HEAD FINDINGS Anterior circulation: Both internal carotid  arteries widely patent through the skull base and siphon regions. Ordinary siphon atherosclerotic calcification but without stenosis greater than 30%. The anterior and middle cerebral vessels are normal without proximal stenosis, aneurysm or vascular malformation. No large or medium vessel occlusion. Posterior circulation: Both vertebral arteries widely patent to the basilar. No basilar stenosis. Posterior circulation branch vessels are normal. Venous sinuses: Patent and normal. Anatomic variants: None significant. Review of the MIP images confirms the above findings IMPRESSION: 1. Mild atherosclerotic disease at the right carotid bifurcation but without stenosis. Left carotid bifurcation normal. 2. No intracranial large or medium vessel occlusion or correctable proximal stenosis. 3. Aortic atherosclerosis. Aortic Atherosclerosis (ICD10-I70.0). Electronically Signed   By: Nelson Chimes M.D.   On: 03/12/2020 10:46   DG Chest 2 View  Result Date: 03/19/2020 CLINICAL DATA:  Bacteremia, evaluate for pneumonia. EXAM: CHEST - 2 VIEW COMPARISON:  Prior chest CT 03/13/2020. FINDINGS: A right chest infusion port catheter is present with tip terminating in the region of the superior cavoatrial junction. Heart size within normal limits. Aortic atherosclerosis. No appreciable airspace consolidation. No evidence of pleural effusion or pneumothorax. No acute bony abnormality identified. IMPRESSION: No evidence of active cardiopulmonary disease. Aortic Atherosclerosis (ICD10-I70.0). Electronically Signed   By: Kellie Simmering DO   On: 03/19/2020 12:19   CT HEAD WO CONTRAST  Result Date: 03/11/2020 CLINICAL DATA:  Slurred speech EXAM: CT HEAD WITHOUT CONTRAST TECHNIQUE: Contiguous axial images were obtained from the base of the skull through the vertex without intravenous contrast. COMPARISON:  None. FINDINGS: Brain: No evidence of acute territorial  infarction, hemorrhage, hydrocephalus,extra-axial collection or mass lesion/mass effect. Normal gray-white differentiation. Ventricles are normal in size and contour. Vascular: No hyperdense vessel or unexpected calcification. Skull: The skull is intact. No fracture or focal lesion identified. Sinuses/Orbits: The visualized paranasal sinuses and mastoid air cells are clear. The orbits and globes intact. Other: None IMPRESSION: No acute intracranial abnormality. Electronically Signed   By: Prudencio Pair M.D.   On: 03/11/2020 16:48   CT ANGIO NECK W OR WO CONTRAST  Result Date: 03/12/2020 CLINICAL DATA:  Slurred speech.  Pancreatic cancer patient. EXAM: CT ANGIOGRAPHY HEAD AND NECK TECHNIQUE: Multidetector CT imaging of the head and neck was performed using the standard protocol during bolus administration of intravenous contrast. Multiplanar CT image reconstructions and MIPs were obtained to evaluate the vascular anatomy. Carotid stenosis measurements (when applicable) are obtained utilizing NASCET criteria, using the distal internal carotid diameter as the denominator. CONTRAST:  37mL OMNIPAQUE IOHEXOL 350 MG/ML SOLN COMPARISON:  Head CT yesterday. FINDINGS: CT HEAD FINDINGS Brain: No evidence of acute infarction. No accelerated brain atrophy. No mass, hemorrhage, hydrocephalus or extra-axial collection. Vascular: There is atherosclerotic calcification of the major vessels at the base of the brain. Skull: Negative Sinuses: Clear Orbits: Normal Review of the MIP images confirms the above findings CTA NECK FINDINGS Aortic arch: Aortic atherosclerosis. Branching pattern is normal without origin stenosis. Right carotid system: Common carotid artery widely patent to the bifurcation. Calcified plaque at the carotid bifurcation but no stenosis. ICA bulb widely patent. Cervical ICA is normal. Left carotid system: Common carotid artery widely patent to the bifurcation. Carotid bifurcation is normal without plaque or  stenosis. Cervical ICA is normal. Vertebral arteries: Both vertebral arteries widely patent at their origins and through the cervical region to the foramen magnum. Skeleton: Ordinary cervical spondylosis. Other neck: No mass or lymphadenopathy. Upper chest: Normal Review of the MIP images confirms the above findings CTA HEAD FINDINGS Anterior  circulation: Both internal carotid arteries widely patent through the skull base and siphon regions. Ordinary siphon atherosclerotic calcification but without stenosis greater than 30%. The anterior and middle cerebral vessels are normal without proximal stenosis, aneurysm or vascular malformation. No large or medium vessel occlusion. Posterior circulation: Both vertebral arteries widely patent to the basilar. No basilar stenosis. Posterior circulation branch vessels are normal. Venous sinuses: Patent and normal. Anatomic variants: None significant. Review of the MIP images confirms the above findings IMPRESSION: 1. Mild atherosclerotic disease at the right carotid bifurcation but without stenosis. Left carotid bifurcation normal. 2. No intracranial large or medium vessel occlusion or correctable proximal stenosis. 3. Aortic atherosclerosis. Aortic Atherosclerosis (ICD10-I70.0). Electronically Signed   By: Nelson Chimes M.D.   On: 03/12/2020 10:46   CT CHEST W CONTRAST  Result Date: 03/13/2020 CLINICAL DATA:  64 year old female with history of pancreatic cancer. EXAM: CT CHEST WITH CONTRAST TECHNIQUE: Multidetector CT imaging of the chest was performed during intravenous contrast administration. CONTRAST:  60mL OMNIPAQUE IOHEXOL 300 MG/ML  SOLN COMPARISON:  No priors. FINDINGS: Cardiovascular: Heart size is normal. There is no significant pericardial fluid, thickening or pericardial calcification. 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 No definite coronary artery calcifications. Right internal jugular single-lumen porta cath  with tip terminating in the superior aspect of the right atrium. Mediastinum/Nodes: No pathologically enlarged mediastinal or hilar lymph nodes. Esophagus is unremarkable in appearance. No axillary lymphadenopathy. Lungs/Pleura: No suspicious appearing pulmonary nodules or masses are noted. No acute consolidative airspace disease. No pleural effusions. Upper Abdomen: Extensive pneumobilia. Musculoskeletal: There are no aggressive appearing lytic or blastic lesions noted in the visualized portions of the skeleton. IMPRESSION: 1. No findings to suggest metastatic disease to the thorax. 2. 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 Recommend annual imaging followup by CTA or MRA. This recommendation follows 2010 ACCF/AHA/AATS/ACR/ASA/SCA/SCAI/SIR/STS/SVM Guidelines for the Diagnosis and Management of Patients with Thoracic Aortic Disease. Circulation. 2010; 121: J941-D408. Aortic aneurysm NOS (ICD10-I71.9). Aortic Atherosclerosis (ICD10-I70.0). Electronically Signed   By: Vinnie Langton M.D.   On: 03/13/2020 11:59   MR BRAIN WO CONTRAST  Result Date: 03/12/2020 CLINICAL DATA:  Transit ischemic attack.  Slurred speech. EXAM: MRI HEAD WITHOUT CONTRAST TECHNIQUE: Multiplanar, multiecho pulse sequences of the brain and surrounding structures were obtained without intravenous contrast. COMPARISON:  03/11/2020 head CT.  03/12/2020 CTA head and neck. FINDINGS: Brain: No acute infarct or intracranial hemorrhage. Cerebral volume is within normal limits. Minimal chronic microvascular ischemic changes. No midline shift, ventriculomegaly or extra-axial fluid collection. No mass lesion. Vascular: Please see same day CTA. Skull and upper cervical spine: Normal marrow signal. Sinuses/Orbits: Normal orbits. Clear paranasal sinuses. No mastoid effusion. Other: None. IMPRESSION: No acute intracranial process. Minimal chronic microvascular ischemic changes. Electronically Signed   By:  Primitivo Gauze M.D.   On: 03/12/2020 11:49   US Carotid Bilateral (at [facility] and [facility] only)  Result Date: 03/12/2020 CLINICAL DATA:  64 year old female with a history of TIA EXAM: BILATERAL CAROTID DUPLEX ULTRASOUND TECHNIQUE: Pearline Cables scale imaging, color Doppler and duplex ultrasound were performed of bilateral carotid and vertebral arteries in the neck. COMPARISON:  None. FINDINGS: Criteria: Quantification of carotid stenosis is based on velocity parameters that correlate the residual internal carotid diameter with NASCET-based stenosis levels, using the diameter of the distal internal carotid lumen as the denominator for stenosis measurement. The following velocity measurements were obtained: RIGHT ICA:  Systolic 144 cm/sec, Diastolic 52  cm/sec CCA:  98 cm/sec SYSTOLIC ICA/CCA RATIO:  1.6 ECA:  170 cm/sec LEFT ICA:  Systolic 89 cm/sec, Diastolic 14 cm/sec CCA:  95 cm/sec SYSTOLIC ICA/CCA RATIO:  0.9 ECA:  119 cm/sec Right Brachial SBP: Not acquired Left Brachial SBP: Not acquired RIGHT CAROTID ARTERY: No significant calcifications of the right common carotid artery. Intermediate waveform maintained. Heterogeneous and partially calcified plaque at the right carotid bifurcation. No significant lumen shadowing. Low resistance waveform of the right ICA. No significant tortuosity. RIGHT VERTEBRAL ARTERY: Antegrade flow with low resistance waveform. LEFT CAROTID ARTERY: No significant calcifications of the left common carotid artery. Intermediate waveform maintained. Heterogeneous and partially calcified plaque at the left carotid bifurcation without significant lumen shadowing. Low resistance waveform of the left ICA. No significant tortuosity. LEFT VERTEBRAL ARTERY:  Antegrade flow with low resistance waveform. IMPRESSION: Right: Heterogeneous and partially calcified plaque at the right carotid bifurcation, with discordant results regarding degree of stenosis by established duplex criteria. Peak velocity suggests  50%-69% stenosis, with the ICA/ CCA ratio suggesting a lesser degree of stenosis. If establishing a more accurate degree of stenosis is required, cerebral angiogram should be considered, or as a second best test, CTA. Left: Color duplex indicates minimal heterogeneous and calcified plaque, with no hemodynamically significant stenosis by duplex criteria in the extracranial cerebrovascular circulation. Signed, Dulcy Fanny. Dellia Nims, RPVI [facility] Radiology Electronically Signed   By: Corrie Mckusick D.O.   On: 03/12/2020 14:02   PERIPHERAL VASCULAR CATHETERIZATION  Result Date: 03/07/2020 See op note  ECHOCARDIOGRAM COMPLETE  Result Date: 03/12/2020    ECHOCARDIOGRAM REPORT   Patient Name:   Cassandra [name] Date of Exam: 03/12/2020 Medical Rec #:  [id number]   Height:       67.0 in Accession #:    [id number]  Weight:       145.3 lb Date of Birth:  02-10-1956   BSA:          1.765 m Patient Age:    64 years    BP:           125/74 mmHg Patient Gender: F           HR:           63 bpm. Exam Location:  [facility] Procedure: 2D Echo, Cardiac Doppler and Color Doppler Indications:     TIA 435.9  History:         Patient has no prior history of Echocardiogram examinations. No                  cardiac hsitory listed.  Sonographer:     Sherrie Sport RDCS (AE) Referring Phys:  [id number] [place] Diagnosing Phys: Kate Sable MD IMPRESSIONS  1. Left ventricular ejection fraction, by estimation, is 65 to 70%. The left ventricle has normal function. The left ventricle has no regional wall motion abnormalities. Left ventricular diastolic parameters were normal.  2. Right ventricular systolic function is normal. The right ventricular size is normal. There is normal pulmonary artery systolic pressure.  3. The mitral valve is normal in structure. No evidence of mitral valve regurgitation. No evidence of mitral stenosis.  4. The aortic valve is normal in structure. Aortic valve  regurgitation is not visualized. No aortic stenosis is present.  5. The inferior vena cava is normal in size with greater than 50% respiratory variability, suggesting right atrial pressure of 3 mmHg. FINDINGS  Left Ventricle: Left ventricular ejection fraction, by estimation,  is 65 to 70%. The left ventricle has normal function. The left ventricle has no regional wall motion abnormalities. The left ventricular internal cavity size was normal in size. There is  no left ventricular hypertrophy. Left ventricular diastolic parameters were normal. Right Ventricle: The right ventricular size is normal. No increase in right ventricular wall thickness. Right ventricular systolic function is normal. There is normal pulmonary artery systolic pressure. The tricuspid regurgitant velocity is 2.57 m/s, and  with an assumed right atrial pressure of 3 mmHg, the estimated right ventricular systolic pressure is 59.7 mmHg. Left Atrium: Left atrial size was normal in size. Right Atrium: Right atrial size was normal in size. Pericardium: There is no evidence of pericardial effusion. Mitral Valve: The mitral valve is normal in structure. No evidence of mitral valve regurgitation. No evidence of mitral valve stenosis. Tricuspid Valve: The tricuspid valve is normal in structure. Tricuspid valve regurgitation is not demonstrated. No evidence of tricuspid stenosis. Aortic Valve: The aortic valve is normal in structure. Aortic valve regurgitation is not visualized. No aortic stenosis is present. Aortic valve mean gradient measures 4.5 mmHg. Aortic valve peak gradient measures 7.3 mmHg. Aortic valve area, by VTI measures 3.23 cm. Pulmonic Valve: The pulmonic valve was normal in structure. Pulmonic valve regurgitation is not visualized. No evidence of pulmonic stenosis. Aorta: The aortic root is normal in size and structure. Venous: The inferior vena cava is normal in size with greater than 50% respiratory variability, suggesting right atrial  pressure of 3 mmHg. IAS/Shunts: No atrial level shunt detected by color flow Doppler.  LEFT VENTRICLE PLAX 2D LVIDd:         4.00 cm  Diastology LVIDs:         2.28 cm  LV e' medial:    7.72 cm/s LV PW:         1.50 cm  LV E/e' medial:  10.8 LV IVS:        0.90 cm  LV e' lateral:   11.50 cm/s LVOT diam:     2.00 cm  LV E/e' lateral: 7.3 LV SV:         96 LV SV Index:   54 LVOT Area:     3.14 cm  RIGHT VENTRICLE RV Basal diam:  3.77 cm RV S prime:     19.00 cm/s TAPSE (M-mode): 3.7 cm LEFT ATRIUM             Index       RIGHT ATRIUM           Index LA diam:        2.30 cm 1.30 cm/m  RA Area:     16.70 cm LA Vol (A2C):   45.2 ml 25.60 ml/m RA Volume:   45.40 ml  25.72 ml/m LA Vol (A4C):   35.1 ml 19.88 ml/m LA Biplane Vol: 39.1 ml 22.15 ml/m  AORTIC VALVE                   PULMONIC VALVE AV Area (Vmax):    3.22 cm    PV Vmax:        0.79 m/s AV Area (Vmean):   3.07 cm    PV Peak grad:   2.5 mmHg AV Area (VTI):     3.23 cm    RVOT Peak grad: 4 mmHg AV Vmax:           135.50 cm/s AV Vmean:          99.250 cm/s  AV VTI:            0.297 m AV Peak Grad:      7.3 mmHg AV Mean Grad:      4.5 mmHg LVOT Vmax:         139.00 cm/s LVOT Vmean:        97.100 cm/s LVOT VTI:          0.305 m LVOT/AV VTI ratio: 1.03  AORTA Ao Root diam: 3.50 cm MITRAL VALVE               TRICUSPID VALVE MV Area (PHT): 2.39 cm    TR Peak grad:   26.4 mmHg MV Decel Time: 317 msec    TR Vmax:        257.00 cm/s MV E velocity: 83.40 cm/s MV A velocity: 72.30 cm/s  SHUNTS MV E/A ratio:  1.15        Systemic VTI:  0.30 m                            Systemic Diam: 2.00 cm Kate Sable MD Electronically signed by Kate Sable MD Signature Date/Time: 03/12/2020/4:01:51 PM    Final    ECHO TEE  Result Date: 03/21/2020    TRANSESOPHOGEAL ECHO REPORT   Patient Name:   Cassandra [name] Date of Exam: 03/21/2020 Medical Rec #:  [id number]   Height:       67.0 in Accession #:    [id number]  Weight:       138.0 lb Date of Birth:  Aug 23, 1955   BSA:           1.727 m Patient Age:    42 years    BP:           133/83 mmHg Patient Gender: F           HR:           83 bpm. Exam Location:  [facility] Procedure: Transesophageal Echo, Color Doppler, Cardiac Doppler and Saline            Contrast Bubble Study Indications:     Not listed on order  History:         Patient has prior history of Echocardiogram examinations, most                  recent 03/12/2020.  Sonographer:     Sherrie Sport RDCS (AE) Referring Phys:  [place] Diagnosing Phys: Neoma Laming MD PROCEDURE: The transesophogeal probe was passed without difficulty through the esophogus of the patient. Local oropharyngeal anesthetic was provided with Benzocaine spray. Sedation performed by performing physician. The patient's vital signs; including heart rate, blood pressure, and oxygen saturation; remained stable throughout the procedure. The patient developed no complications during the procedure. IMPRESSIONS  1. Left ventricular ejection fraction, by estimation, is 60 to 65%. The left ventricle has normal function. The left ventricle has no regional wall motion abnormalities.  2. Right ventricular systolic function is normal. The right ventricular size is normal.  3. No left atrial/left atrial appendage thrombus was detected.  4. The mitral valve is normal in structure. Mild mitral valve regurgitation. No evidence of mitral stenosis.  5. The aortic valve is abnormal. Aortic valve regurgitation is mild. No aortic stenosis is present.  6. The inferior vena cava is normal in size with greater than 50% respiratory variability, suggesting right atrial pressure of 3 mmHg.  7. Agitated  saline contrast bubble study was negative, with no evidence of any interatrial shunt. Conclusion(s)/Recommendation(s): No evidence of vegetation/infective endocarditis on this transesophageal echocardiogram. FINDINGS  Left Ventricle: Left ventricular ejection fraction, by estimation, is 60 to 65%. The left ventricle has normal function.  The left ventricle has no regional wall motion abnormalities. The left ventricular internal cavity size was normal in size. There is  no left ventricular hypertrophy. Right Ventricle: The right ventricular size is normal. No increase in right ventricular wall thickness. Right ventricular systolic function is normal. Left Atrium: Left atrial size was normal in size. No left atrial/left atrial appendage thrombus was detected. Right Atrium: Right atrial size was normal in size. Pericardium: There is no evidence of pericardial effusion. Mitral Valve: The mitral valve is normal in structure. Mild mitral valve regurgitation. No evidence of mitral valve stenosis. Tricuspid Valve: The tricuspid valve is normal in structure. Tricuspid valve regurgitation is mild . No evidence of tricuspid stenosis. Aortic Valve: The aortic valve is abnormal. Aortic valve regurgitation is mild. No aortic stenosis is present. Pulmonic Valve: The pulmonic valve was normal in structure. Pulmonic valve regurgitation is trivial. No evidence of pulmonic stenosis. Aorta: The aortic root is normal in size and structure. Venous: The inferior vena cava is normal in size with greater than 50% respiratory variability, suggesting right atrial pressure of 3 mmHg. IAS/Shunts: No atrial level shunt detected by color flow Doppler. Agitated saline contrast was given intravenously to evaluate for intracardiac shunting. Agitated saline contrast bubble study was negative, with no evidence of any interatrial shunt. There  is no evidence of a patent foramen ovale. No ventricular septal defect is seen or detected. There is no evidence of an atrial septal defect. Neoma Laming MD Electronically signed by Neoma Laming MD Signature Date/Time: 03/21/2020/8:39:59 AM    Final      ASSESSMENT & PLAN:  1. Primary pancreatic cancer ([place])   2. Encounter for antineoplastic chemotherapy   3. Neutropenia, unspecified type ([place])   4. Dysarthria   5. 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 Labs are reviewed and discussed with patient Proceed with cycle 3 FOLFIRINOX with Udenyca support on day 5.  Neutropenia, chemotherapy-induced. Patient received Zarxio x1 last week.  Labs are reviewed.  ANC has normalized. Proceed with treatment today. Recommend patient to take Claritin for 4 days as she is getting Udenyca for the current cycle  #Weight loss, refer to nutritionist.  Continue nutrition supplementation   #Hypokalemia, resolved #Lip numbness, questionable chemotherapy-induced adverse reaction versus neurologic deficits from previous stroke events.  Advised patient to discuss with primary care provider to see if she needs to see a neurologist. I added Benadryl as her premedication per patient's request.  Discussed with patient that Benadryl may or may not help with the lip numbness symptoms.   Addendum, patient developed lip/tougue numbness.  Her symptoms are consistent with irinotecan induced dysarthria, which is a self-limited phenomenon.  Her symptoms resolved after irinotecan infusion was held and I advised RN to resume irinotecan at 50% rate.  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   #Gassy and oil consistency stool Questional malabsorption.  We discussed about trials of pancreatic enzyme and patient declined. Recommend that she may take over-the-counter Gas-X Supportive care measures are necessary for patient well-being and will be provided as necessary. We spent sufficient time to discuss many aspect of care, questions  were answered to patient's satisfaction.   All questions were answered. The patient knows to call the clinic with any problems questions or concerns.  Return of visit: 2 weeks  [name], MD, PhD Hematology Oncology [facility] Pager- [phone] 04/25/2020

## 2020-04-28 ENCOUNTER — Other Ambulatory Visit: Payer: Self-pay

## 2020-04-28 ENCOUNTER — Inpatient Hospital Stay: Payer: BC Managed Care – PPO | Attending: Oncology

## 2020-04-28 DIAGNOSIS — C259 Malignant neoplasm of pancreas, unspecified: Secondary | ICD-10-CM | POA: Diagnosis present

## 2020-04-28 DIAGNOSIS — Z5111 Encounter for antineoplastic chemotherapy: Secondary | ICD-10-CM | POA: Insufficient documentation

## 2020-04-28 DIAGNOSIS — Z452 Encounter for adjustment and management of vascular access device: Secondary | ICD-10-CM | POA: Diagnosis not present

## 2020-04-28 DIAGNOSIS — D701 Agranulocytosis secondary to cancer chemotherapy: Secondary | ICD-10-CM | POA: Diagnosis not present

## 2020-04-28 DIAGNOSIS — C787 Secondary malignant neoplasm of liver and intrahepatic bile duct: Secondary | ICD-10-CM | POA: Diagnosis present

## 2020-04-28 MED ORDER — SODIUM CHLORIDE 0.9% FLUSH
10.0000 mL | INTRAVENOUS | Status: DC | PRN
  Administered 2020-04-28: 10 mL
  Filled 2020-04-28: qty 10

## 2020-04-28 MED ORDER — HEPARIN SOD (PORK) LOCK FLUSH 100 UNIT/ML IV SOLN
500.0000 [IU] | Freq: Once | INTRAVENOUS | Status: AC | PRN
  Administered 2020-04-28: 500 [IU]
  Filled 2020-04-28: qty 5

## 2020-04-29 ENCOUNTER — Inpatient Hospital Stay: Payer: BC Managed Care – PPO

## 2020-04-29 DIAGNOSIS — D709 Neutropenia, unspecified: Secondary | ICD-10-CM

## 2020-04-29 DIAGNOSIS — Z5111 Encounter for antineoplastic chemotherapy: Secondary | ICD-10-CM | POA: Diagnosis not present

## 2020-04-29 DIAGNOSIS — C259 Malignant neoplasm of pancreas, unspecified: Secondary | ICD-10-CM

## 2020-04-29 MED ORDER — FILGRASTIM-SNDZ 480 MCG/0.8ML IJ SOSY: 480.0000 ug | PREFILLED_SYRINGE | Freq: Once | INTRAMUSCULAR | Status: DC

## 2020-04-29 MED ORDER — PEGFILGRASTIM-CBQV 6 MG/0.6ML ~~LOC~~ SOSY
6.0000 mg | PREFILLED_SYRINGE | Freq: Once | SUBCUTANEOUS | Status: AC
  Administered 2020-04-29: 6 mg via SUBCUTANEOUS
  Filled 2020-04-29: qty 0.6

## 2020-05-01 ENCOUNTER — Telehealth: Payer: Self-pay | Admitting: *Deleted

## 2020-05-01 NOTE — Telephone Encounter (Signed)
It is a low dose already. She may try in the evening time, or stop that and I can switch her to other medication. Thanks.

## 2020-05-01 NOTE — Telephone Encounter (Signed)
What about her dental cleaning appointment?

## 2020-05-01 NOTE — Telephone Encounter (Signed)
Patient called reporting that the Cymbalta is making her drowsy, nausea and feel bad, She is taking it in the mornings and is asking if she needs a dose reduction or if she needs to take it at night. She also has a dental appointment for a cleaning coming up Thursday the day before her next chemotherapy treatment and is asking if she can go to that appointment. Please advise

## 2020-05-01 NOTE — Telephone Encounter (Signed)
Recommend her to postpone. I will further discuss w her  at next visit

## 2020-05-01 NOTE — Telephone Encounter (Signed)
Call returned to patient and advised to take Cymbalta in evenings and to postpone her dental cleaning until Dr Tasia Catchings discusses with her. Patient in agreement with this plan

## 2020-05-02 ENCOUNTER — Telehealth: Payer: Self-pay | Admitting: *Deleted

## 2020-05-02 ENCOUNTER — Other Ambulatory Visit: Payer: Self-pay | Admitting: Adult Health

## 2020-05-02 DIAGNOSIS — E7800 Pure hypercholesterolemia, unspecified: Secondary | ICD-10-CM

## 2020-05-02 NOTE — Telephone Encounter (Signed)
Patient called reporting that she took her Cymbalta last night and it awoke her to have to vomit in the middle of the night. She states she is going to stop medicine for now. Any advise?

## 2020-05-02 NOTE — Telephone Encounter (Signed)
Please advise her to stop

## 2020-05-09 ENCOUNTER — Inpatient Hospital Stay: Payer: BC Managed Care – PPO

## 2020-05-09 ENCOUNTER — Encounter: Payer: Self-pay | Admitting: Oncology

## 2020-05-09 ENCOUNTER — Inpatient Hospital Stay (HOSPITAL_BASED_OUTPATIENT_CLINIC_OR_DEPARTMENT_OTHER): Payer: BC Managed Care – PPO | Admitting: Oncology

## 2020-05-09 VITALS — BP 119/80 | HR 61 | Temp 98.2°F

## 2020-05-09 VITALS — BP 125/84 | HR 74 | Temp 98.6°F | Resp 18 | Wt 133.1 lb

## 2020-05-09 DIAGNOSIS — Z5111 Encounter for antineoplastic chemotherapy: Secondary | ICD-10-CM | POA: Diagnosis not present

## 2020-05-09 DIAGNOSIS — G620 Drug-induced polyneuropathy: Secondary | ICD-10-CM | POA: Diagnosis not present

## 2020-05-09 DIAGNOSIS — C259 Malignant neoplasm of pancreas, unspecified: Secondary | ICD-10-CM

## 2020-05-09 DIAGNOSIS — R471 Dysarthria and anarthria: Secondary | ICD-10-CM

## 2020-05-09 DIAGNOSIS — T451X5A Adverse effect of antineoplastic and immunosuppressive drugs, initial encounter: Secondary | ICD-10-CM

## 2020-05-09 LAB — COMPREHENSIVE METABOLIC PANEL
ALT: 38 U/L (ref 0–44)
AST: 35 U/L (ref 15–41)
Albumin: 3.6 g/dL (ref 3.5–5.0)
Alkaline Phosphatase: 169 U/L — ABNORMAL HIGH (ref 38–126)
Anion gap: 9 (ref 5–15)
BUN: 8 mg/dL (ref 8–23)
CO2: 26 mmol/L (ref 22–32)
Calcium: 8.8 mg/dL — ABNORMAL LOW (ref 8.9–10.3)
Chloride: 107 mmol/L (ref 98–111)
Creatinine, Ser: 0.84 mg/dL (ref 0.44–1.00)
GFR, Estimated: >60 mL/min (ref >60)
Glucose, Bld: 147 mg/dL — ABNORMAL HIGH (ref 70–99)
Potassium: 3.4 mmol/L — ABNORMAL LOW (ref 3.5–5.1)
Sodium: 142 mmol/L (ref 135–145)
Total Bilirubin: 0.4 mg/dL (ref 0.3–1.2)
Total Protein: 6.1 g/dL — ABNORMAL LOW (ref 6.5–8.1)

## 2020-05-09 LAB — CBC WITH DIFFERENTIAL/PLATELET
Abs Immature Granulocytes: 0.15 10*3/uL — ABNORMAL HIGH (ref 0.00–0.07)
Basophils Absolute: 0 10*3/uL (ref 0.0–0.1)
Basophils Relative: 0 %
Eosinophils Absolute: 0.1 10*3/uL (ref 0.0–0.5)
Eosinophils Relative: 1 %
HCT: 30.9 % — ABNORMAL LOW (ref 36.0–46.0)
Hemoglobin: 10.8 g/dL — ABNORMAL LOW (ref 12.0–15.0)
Immature Granulocytes: 2 %
Lymphocytes Relative: 20 %
Lymphs Abs: 1.8 10*3/uL (ref 0.7–4.0)
MCH: 33.4 pg (ref 26.0–34.0)
MCHC: 35 g/dL (ref 30.0–36.0)
MCV: 95.7 fL (ref 80.0–100.0)
Monocytes Absolute: 0.8 10*3/uL (ref 0.1–1.0)
Monocytes Relative: 9 %
Neutro Abs: 6 10*3/uL (ref 1.7–7.7)
Neutrophils Relative %: 68 %
Platelets: 155 10*3/uL (ref 150–400)
RBC: 3.23 MIL/uL — ABNORMAL LOW (ref 3.87–5.11)
RDW: 14.4 % (ref 11.5–15.5)
WBC: 8.9 10*3/uL (ref 4.0–10.5)
nRBC: 0.2 % (ref 0.0–0.2)

## 2020-05-09 MED ORDER — LEUCOVORIN CALCIUM INJECTION 100 MG
20.0000 mg/m2 | Freq: Once | INTRAMUSCULAR | Status: AC
  Administered 2020-05-09: 34 mg via INTRAVENOUS
  Filled 2020-05-09: qty 1.7

## 2020-05-09 MED ORDER — ATROPINE SULFATE 1 MG/ML IJ SOLN
0.5000 mg | Freq: Once | INTRAMUSCULAR | Status: AC
  Administered 2020-05-09: 0.5 mg via INTRAVENOUS
  Filled 2020-05-09: qty 1

## 2020-05-09 MED ORDER — DIPHENHYDRAMINE HCL 50 MG/ML IJ SOLN
50.0000 mg | Freq: Once | INTRAMUSCULAR | Status: AC | PRN
  Administered 2020-05-09: 50 mg via INTRAVENOUS

## 2020-05-09 MED ORDER — SODIUM CHLORIDE 0.9 % IV SOLN
150.0000 mg | Freq: Once | INTRAVENOUS | Status: AC
  Administered 2020-05-09: 150 mg via INTRAVENOUS
  Filled 2020-05-09: qty 5

## 2020-05-09 MED ORDER — DEXTROSE 5 % IV SOLN
Freq: Once | INTRAVENOUS | Status: AC
  Filled 2020-05-09: qty 250

## 2020-05-09 MED ORDER — PALONOSETRON HCL INJECTION 0.25 MG/5ML
0.2500 mg | Freq: Once | INTRAVENOUS | Status: AC
  Administered 2020-05-09: 0.25 mg via INTRAVENOUS
  Filled 2020-05-09: qty 5

## 2020-05-09 MED ORDER — DIPHENHYDRAMINE HCL 50 MG/ML IJ SOLN
INTRAMUSCULAR | Status: AC
  Filled 2020-05-09: qty 1

## 2020-05-09 MED ORDER — SODIUM CHLORIDE 0.9 % IV SOLN
2400.0000 mg/m2 | INTRAVENOUS | Status: DC
  Administered 2020-05-09: 4200 mg via INTRAVENOUS
  Filled 2020-05-09: qty 84

## 2020-05-09 MED ORDER — SODIUM CHLORIDE 0.9 % IV SOLN
150.0000 mg/m2 | Freq: Once | INTRAVENOUS | Status: AC
  Administered 2020-05-09: 260 mg via INTRAVENOUS
  Filled 2020-05-09: qty 4

## 2020-05-09 MED ORDER — OXALIPLATIN CHEMO INJECTION 100 MG/20ML: 85.0000 mg/m2 | Freq: Once | INTRAVENOUS | Status: DC

## 2020-05-09 MED ORDER — OXALIPLATIN CHEMO INJECTION 100 MG/20ML
75.0000 mg/m2 | Freq: Once | INTRAVENOUS | Status: AC
  Administered 2020-05-09: 130 mg via INTRAVENOUS
  Filled 2020-05-09: qty 10

## 2020-05-09 MED ORDER — SODIUM CHLORIDE 0.9 % IV SOLN
10.0000 mg | Freq: Once | INTRAVENOUS | Status: AC
  Administered 2020-05-09: 10 mg via INTRAVENOUS
  Filled 2020-05-09: qty 10

## 2020-05-09 MED ORDER — SODIUM CHLORIDE 0.9% FLUSH
10.0000 mL | Freq: Once | INTRAVENOUS | Status: AC
  Administered 2020-05-09: 10 mL via INTRAVENOUS
  Filled 2020-05-09: qty 10

## 2020-05-09 MED ORDER — SODIUM CHLORIDE 0.9 % IV SOLN: 400.0000 mg/m2 | Freq: Once | INTRAVENOUS | Status: DC

## 2020-05-09 MED ORDER — DIPHENHYDRAMINE HCL 25 MG PO CAPS
50.0000 mg | ORAL_CAPSULE | Freq: Once | ORAL | Status: AC
  Administered 2020-05-09: 50 mg via ORAL
  Filled 2020-05-09: qty 2

## 2020-05-09 NOTE — Progress Notes (Signed)
Hematology/Oncology follow up note [facility] Telephone:([phone]) [phone] [phone]   Patient Care Team: Flinchum, Kelby Aline, FNP as PCP - General (Family Medicine) Flinchum, Kelby Aline, FNP (Family Medicine) [name], RN as Oncology Nurse Navigator [name], MD as Consulting Physician (Oncology)  REFERRING PROVIDER: Sharmon Leyden*  CHIEF COMPLAINTS/REASON FOR VISIT:  Follow up for treatment of pancreatic adenocarcinoma  HISTORY OF PRESENTING ILLNESS:   Cassandra [name] is a  64 y.o.  female with PMH listed below was seen in consultation at the request of  Flinchum, Michelle S, F*  for evaluation of pancreatic adenocarcinoma Patient initially presented with jaundice, transaminitis, bilirubin was 9.9.  CA 19-9 was 1874.  Patient also reports unintentional weight loss. 02/08/2020 MRI abdomen and MRCP with and without contrast was done at [facility] which showed pancreatic head mass measuring up to 3 cm, with marked associated narrowing of the portal vein confluence.  SMA is preserved.  Marked intrahepatic and extrahepatic biliary duct dilatation as well as mild dilatation of the main pancreatic duct. Multiple hepatic masses highly concerning for metastatic disease.  Patient underwent EUS on 02/19/2020, which showed irregular mass identified in the pancreatic head, hypoechoic, measured 24mmx33mm, sonographic evidence concerning for invasion into the superior mesenteric artery.  There is no sign of significant abnormality in the main pancreatic duct.  Dilatation of common bile duct which measured up to 16 mm.  Region of celiac artery was visualized and showed no signs of significant abnormality.  No lymphadenopathy.  FNA showed adenocarcinoma.  02/19/2020, ERCP, malignant.  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  The stricture was treated with placement of wall flex metal stent.  Patient was  seen by [facility] oncology Dr. Pia Mau and was recommended for 3 drug regimen FOLFIRINOX.  Patient prefers to do chemotherapy locally at [facility].  Patient was referred to establish care today. She denies any pain.  Since stent placement, skin jaundice has improved.  Itchiness has also improved. Patient was accompanied by her husband today.  She has a family history of breast cancer in sister and paternal aunt, colon cancer paternal grandmother.  INTERVAL HISTORY Cassandra [name] is a 64 y.o. female who has above history reviewed by me today presents for follow up visit for management of stage IV pancreatic cancer Problems and complaints are listed below: Patient had oxaliplatin and 50% of the irinotecan on day 1 of treatment, she developed slurred speech while she spoke to her husband.  Patient was seen by nurse practitioner Anderson Malta and patient was given Benadryl, Solu-Medrol 125 mg IV x1.  Vital signs are normal. I was notified and I saw the patient at the chair side.  Her symptoms is better but not fully resolved.  She feels " thick in my mouth".  Chemotherapy was held.  5-FU pump was not..  Patient was recommended to go to emergency room for evaluation of stroke.  Her symptoms fully resolved in ER.  Patient was seen by neurologist during admission.  She also had a stroke work-up including CT head, CT angiogram head, CTA neck, MRI brain, carotid duplex, echocardiogram.  All within normal limits.  Some mild atherosclerotic plaque formation on CTA neck and a carotid duplex.  Working diagnosis is TIA with slurred speech.  Patient was discharged on aspirin 81 mg and Lipitor 40 mg. Patient was noted to have hypoglycemia, A1c is 5.5. Today patient was seen post hospitalization and evaluation  prior to chemotherapy with 5-FU pump. She denies any additional episodes of slurred speech.  She has not started aspirin 81 mg or Lipitor 40 mg yet. She feels tired today.  Lack of appetite.  Not eating very much.  Weight loss 3  pounds.  #no reportable targetable mutation on NGS 9/14/2021cycle 1 FOLFIRINOX.  Patient received oxaliplatin and about 50% of Irinotecan on day 1 and had experienced neurologic symptoms.  She went to ER and working diagnosis is TIA.  # 03/19/2020-03/21/2020 patient was admitted due to sepsis with strep pneumonia bacteremia.  Patient was treated with IV Rocephin.  TEE was done which showed no vegetation.  No PFO or ASD.  Patient was discharged home and he finished full course of 14 days of IV Rocephin on 04/02/2020 per ID recommendation.  Repeat blood culture was also negative.   INTERVAL HISTORY Cassandra [name] is a 64 y.o. female who has above history reviewed by me today presents for acute visit.   Problems and complaints are listed below Patient reports feeling well today No nausea vomiting diarrhea.  No fever or chills.  She developed rash/flushing around her mouth after last treatment and rash resolved spontaneously.   Numbness tingling of fingertips and toes, she tried Cymbalta 30mg  and can not tolerate it. She has scheduled neurology appointment for neuropathy evaluation.     Review of Systems  Constitutional: Positive for fatigue. Negative for chills, fever and unexpected weight change.  HENT:   Negative for hearing loss and voice change.   Eyes: Negative for eye problems.  Respiratory: Negative for chest tightness and cough.   Cardiovascular: Negative for chest pain.  Gastrointestinal: Negative for abdominal distention, abdominal pain and blood in stool.  Endocrine: Negative for hot flashes.  Genitourinary: Negative for difficulty urinating and frequency.   Musculoskeletal: Negative for arthralgias.  Skin: Positive for rash. Negative for itching.  Neurological: Positive for numbness. Negative for extremity weakness.  Hematological: Negative for adenopathy.  Psychiatric/Behavioral: Negative for confusion.    MEDICAL HISTORY:  Past Medical History:  Diagnosis Date  . Cancer  [facility])    pancreatic cancer  . Colon polyps   . Family history of breast cancer   . Neutropenia ([place]) 04/18/2020  . Osteopenia after menopause 05/2017   femoral neck T score -2.0    SURGICAL HISTORY: Past Surgical History:  Procedure Laterality Date  . COLONOSCOPY  07/2015   WNL  . COLONOSCOPY  2008/2011  . OVARIAN CYST REMOVAL  1992   dermoid-Dr [place]  . PORTA CATH INSERTION N/A 03/07/2020   Procedure: PORTA CATH INSERTION;  Surgeon: Algernon Huxley, MD;  Location: [facility] [facility];  Service: Cardiovascular;  Laterality: N/A;  . TEE WITHOUT CARDIOVERSION N/A 03/21/2020   Procedure: TRANSESOPHAGEAL ECHOCARDIOGRAM (TEE);  Surgeon: Dionisio David, MD;  Location: [facility] ORS;  Service: Cardiovascular;  Laterality: N/A;  . TUBAL LIGATION  1993    SOCIAL HISTORY: Social History   Socioeconomic History  . Marital status: Married    Spouse name: Not on file  . Number of children: 2  . Years of education: Not on file  . Highest education level: Not on file  Occupational History  . Occupation: Pharmacist, hospital    Comment: retired  . Occupation: Farmer  Tobacco Use  . Smoking status: Former Smoker    Packs/day: 0.50    Years: 10.00    Pack years: 5.00    Types: Cigarettes    Quit date: 1987    Years since quitting:  34.8  . Smokeless tobacco: Never Used  . Tobacco comment: Quit smoking 1987  Vaping Use  . Vaping Use: Never used  Substance and Sexual Activity  . Alcohol use: Not Currently    Comment: 0-2 mixed drinks a day  . Drug use: No  . Sexual activity: Not Currently    Birth control/protection: Post-menopausal  Other Topics Concern  . Not on file  Social History Narrative  . Not on file   Social Determinants of Health   Financial Resource Strain:   . Difficulty of Paying Living Expenses: Not on file  Food Insecurity:   . Worried About Charity fundraiser in the Last Year: Not on file  . Ran Out of Food in the Last Year: Not on file  Transportation Needs:   . Lack of  Transportation (Medical): Not on file  . Lack of Transportation (Non-Medical): Not on file  Physical Activity:   . Days of Exercise per Week: Not on file  . Minutes of Exercise per Session: Not on file  Stress:   . Feeling of Stress : Not on file  Social Connections:   . Frequency of Communication with Friends and Family: Not on file  . Frequency of Social Gatherings with Friends and Family: Not on file  . Attends Religious Services: Not on file  . Active Member of Clubs or Organizations: Not on file  . Attends Archivist Meetings: Not on file  . Marital Status: Not on file  Intimate Partner Violence:   . Fear of Current or Ex-Partner: Not on file  . Emotionally Abused: Not on file  . Physically Abused: Not on file  . Sexually Abused: Not on file    FAMILY HISTORY: Family History  Problem Relation Age of Onset  . Breast cancer Paternal Aunt 50  . Diabetes Mother   . Osteoporosis Mother   . Hyperlipidemia Father   . Rheumatic fever Father   . Valvular heart disease Father   . Cancer Paternal Grandmother        possible colon  . Breast cancer Sister 71    ALLERGIES:  is allergic to penicillin g.  MEDICATIONS:  Current Outpatient Medications  Medication Sig Dispense Refill  . aspirin EC 81 MG tablet Take 1 tablet (81 mg total) by mouth daily. Swallow whole. 150 tablet 2  . atorvastatin (LIPITOR) 40 MG tablet TAKE 1 TABLET BY MOUTH EVERY DAY 30 tablet 1  . Cholecalciferol 25 MCG (1000 UT) tablet Take 2,000 Units by mouth daily.    . DULoxetine (CYMBALTA) 30 MG capsule Take 1 capsule (30 mg total) by mouth daily. (Patient not taking: Reported on 04/25/2020) 30 capsule 0  . lidocaine-prilocaine (EMLA) cream Apply to affected area once (Patient taking differently: Apply 1 application topically as directed. ) 30 g 3  . loperamide (IMODIUM A-D) 2 MG tablet Take 2 at onset of diarrhea, then 1 every 2hrs until 12hr without a BM. May take 2 tab every 4hrs at bedtime. If  diarrhea recurs repeat. 100 tablet 1  . magic mouthwash w/lidocaine SOLN Take 5 mLs by mouth 4 (four) times daily as needed for mouth pain. Sig: Swish & Spit 5-10 ml four times a day as needed. Dispense 480 ml. 1RF 480 mL 1  . Multiple Vitamins-Minerals (MULTIVITAMIN ADULT PO) Take 1 tablet by mouth daily.     . ondansetron (ZOFRAN) 8 MG tablet Take 1 tablet (8 mg total) by mouth 2 (two) times daily as needed. Start on  day 3 after chemotherapy. 30 tablet 1  . potassium chloride (KLOR-CON) 10 MEQ tablet Take 1 tablet (10 mEq total) by mouth daily. 7 tablet 0  . prochlorperazine (COMPAZINE) 10 MG tablet Take 1 tablet (10 mg total) by mouth every 6 (six) hours as needed (Nausea or vomiting). 30 tablet 1  . simethicone (GAS-X) 80 MG chewable tablet Chew 1 tablet (80 mg total) by mouth every 8 (eight) hours as needed for flatulence. 60 tablet 0   No current facility-administered medications for this visit.     PHYSICAL EXAMINATION: ECOG PERFORMANCE STATUS: 1 - Symptomatic but completely ambulatory Vitals:   05/09/20 0827  BP: 125/84  Pulse: 74  Resp: 18  Temp: 98.6 F (37 C)   Filed Weights   05/09/20 0827  Weight: 133 lb 1.6 oz (60.4 kg)    Physical Exam Constitutional:      General: She is not in acute distress. HENT:     Head: Normocephalic and atraumatic.  Eyes:     General: No scleral icterus. Cardiovascular:     Rate and Rhythm: Normal rate and regular rhythm.     Heart sounds: Normal heart sounds.  Pulmonary:     Effort: Pulmonary effort is normal. No respiratory distress.     Breath sounds: No wheezing.  Abdominal:     General: Bowel sounds are normal. There is no distension.     Palpations: Abdomen is soft.  Musculoskeletal:        General: No deformity. Normal range of motion.     Cervical back: Normal range of motion and neck supple.  Skin:    General: Skin is warm and dry.     Coloration: Skin is not jaundiced.     Findings: No erythema or rash.  Neurological:      Mental Status: She is alert and oriented to person, place, and time. Mental status is at baseline.     Cranial Nerves: No cranial nerve deficit.     Coordination: Coordination normal.  Psychiatric:        Mood and Affect: Mood normal.     LABORATORY DATA:  I have reviewed the data as listed Lab Results  Component Value Date   WBC 6.8 04/25/2020   HGB 11.5 (L) 04/25/2020   HCT 32.8 (L) 04/25/2020   MCV 95.9 04/25/2020   PLT 225 04/25/2020   Recent Labs    02/06/20 1049 02/29/20 1314 03/19/20 1024 03/19/20 1024 03/20/20 0411 03/21/20 0318 04/04/20 0827 04/18/20 0757 04/21/20 1439 04/25/20 0805  NA 137   < > 129*   < > 136 140   < > 139 141 138  K 4.1   < > 3.8   < > 4.0 3.7   < > 3.2* 4.1 4.1  CL 99   < > 95*   < > 104 108   < > 107 107 104  CO2 23   < > 25   < > 25 25   < > 25 28 26   GLUCOSE 113*   < > 145*   < > 126* 114*   < > 161* 96 103*  BUN 11   < > 11   < > 7* 6*   < > 11 9 11   CREATININE 0.80   < > 0.85   < > 0.66 0.52   < > 0.65 0.70 0.68  CALCIUM 10.2   < > 8.9   < > 8.3* 8.2*   < > 8.4* 8.3* 8.8*  GFRNONAA 78   < > >60   < > >60 >60   < > >60 >60 >60  GFRAA 90   < > >60  --  >60 >60  --   --   --   --   PROT 7.0   < > 7.1   < >  --  5.3*   < > 5.9* 5.9* 6.4*  ALBUMIN 4.9*   < > 3.5   < >  --  2.5*   < > 3.5 3.6 3.8  AST 195*   < > 30   < >  --  52*   < > 28 33 28  ALT 366*   < > 43   < >  --  74*   < > 35 44 38  ALKPHOS 934*   < > 125   < >  --  112   < > 103 126 131*  BILITOT 9.9*   < > 1.5*   < >  --  1.0   < > 0.8 0.6 0.6  BILIDIR 8.11*  --   --   --   --   --   --   --   --   --   IBILI 1.79*  --   --   --   --   --   --   --   --   --    < > = values in this interval not displayed.   Iron/TIBC/Ferritin/ %Sat No results found for: IRON, TIBC, FERRITIN, IRONPCTSAT    RADIOGRAPHIC STUDIES: I have personally reviewed the radiological images as listed and agreed with the findings in the report. Reviewed findings of MRI abdomen MRCP done at  [facility]. CT ANGIO HEAD W OR WO CONTRAST  Result Date: 03/12/2020 CLINICAL DATA:  Slurred speech.  Pancreatic cancer patient. EXAM: CT ANGIOGRAPHY HEAD AND NECK TECHNIQUE: Multidetector CT imaging of the head and neck was performed using the standard protocol during bolus administration of intravenous contrast. Multiplanar CT image reconstructions and MIPs were obtained to evaluate the vascular anatomy. Carotid stenosis measurements (when applicable) are obtained utilizing NASCET criteria, using the distal internal carotid diameter as the denominator. CONTRAST:  43mL OMNIPAQUE IOHEXOL 350 MG/ML SOLN COMPARISON:  Head CT yesterday. FINDINGS: CT HEAD FINDINGS Brain: No evidence of acute infarction. No accelerated brain atrophy. No mass, hemorrhage, hydrocephalus or extra-axial collection. Vascular: There is atherosclerotic calcification of the major vessels at the base of the brain. Skull: Negative Sinuses: Clear Orbits: Normal Review of the MIP images confirms the above findings CTA NECK FINDINGS Aortic arch: Aortic atherosclerosis. Branching pattern is normal without origin stenosis. Right carotid system: Common carotid artery widely patent to the bifurcation. Calcified plaque at the carotid bifurcation but no stenosis. ICA bulb widely patent. Cervical ICA is normal. Left carotid system: Common carotid artery widely patent to the bifurcation. Carotid bifurcation is normal without plaque or stenosis. Cervical ICA is normal. Vertebral arteries: Both vertebral arteries widely patent at their origins and through the cervical region to the foramen magnum. Skeleton: Ordinary cervical spondylosis. Other neck: No mass or lymphadenopathy. Upper chest: Normal Review of the MIP images confirms the above findings CTA HEAD FINDINGS Anterior circulation: Both internal carotid arteries widely patent through the skull base and siphon regions. Ordinary siphon atherosclerotic calcification but without stenosis greater than 30%. The  anterior and middle cerebral vessels are normal without proximal stenosis, aneurysm or vascular malformation. No large or medium vessel occlusion. Posterior circulation: Both  vertebral arteries widely patent to the basilar. No basilar stenosis. Posterior circulation branch vessels are normal. Venous sinuses: Patent and normal. Anatomic variants: None significant. Review of the MIP images confirms the above findings IMPRESSION: 1. Mild atherosclerotic disease at the right carotid bifurcation but without stenosis. Left carotid bifurcation normal. 2. No intracranial large or medium vessel occlusion or correctable proximal stenosis. 3. Aortic atherosclerosis. Aortic Atherosclerosis (ICD10-I70.0). Electronically Signed   By: Nelson Chimes M.D.   On: 03/12/2020 10:46   DG Chest 2 View  Result Date: 03/19/2020 CLINICAL DATA:  Bacteremia, evaluate for pneumonia. EXAM: CHEST - 2 VIEW COMPARISON:  Prior chest CT 03/13/2020. FINDINGS: A right chest infusion port catheter is present with tip terminating in the region of the superior cavoatrial junction. Heart size within normal limits. Aortic atherosclerosis. No appreciable airspace consolidation. No evidence of pleural effusion or pneumothorax. No acute bony abnormality identified. IMPRESSION: No evidence of active cardiopulmonary disease. Aortic Atherosclerosis (ICD10-I70.0). Electronically Signed   By: Kellie Simmering DO   On: 03/19/2020 12:19   CT HEAD WO CONTRAST  Result Date: 03/11/2020 CLINICAL DATA:  Slurred speech EXAM: CT HEAD WITHOUT CONTRAST TECHNIQUE: Contiguous axial images were obtained from the base of the skull through the vertex without intravenous contrast. COMPARISON:  None. FINDINGS: Brain: No evidence of acute territorial infarction, hemorrhage, hydrocephalus,extra-axial collection or mass lesion/mass effect. Normal gray-white differentiation. Ventricles are normal in size and contour. Vascular: No hyperdense vessel or unexpected calcification. Skull:  The skull is intact. No fracture or focal lesion identified. Sinuses/Orbits: The visualized paranasal sinuses and mastoid air cells are clear. The orbits and globes intact. Other: None IMPRESSION: No acute intracranial abnormality. Electronically Signed   By: Prudencio Pair M.D.   On: 03/11/2020 16:48   CT ANGIO NECK W OR WO CONTRAST  Result Date: 03/12/2020 CLINICAL DATA:  Slurred speech.  Pancreatic cancer patient. EXAM: CT ANGIOGRAPHY HEAD AND NECK TECHNIQUE: Multidetector CT imaging of the head and neck was performed using the standard protocol during bolus administration of intravenous contrast. Multiplanar CT image reconstructions and MIPs were obtained to evaluate the vascular anatomy. Carotid stenosis measurements (when applicable) are obtained utilizing NASCET criteria, using the distal internal carotid diameter as the denominator. CONTRAST:  45mL OMNIPAQUE IOHEXOL 350 MG/ML SOLN COMPARISON:  Head CT yesterday. FINDINGS: CT HEAD FINDINGS Brain: No evidence of acute infarction. No accelerated brain atrophy. No mass, hemorrhage, hydrocephalus or extra-axial collection. Vascular: There is atherosclerotic calcification of the major vessels at the base of the brain. Skull: Negative Sinuses: Clear Orbits: Normal Review of the MIP images confirms the above findings CTA NECK FINDINGS Aortic arch: Aortic atherosclerosis. Branching pattern is normal without origin stenosis. Right carotid system: Common carotid artery widely patent to the bifurcation. Calcified plaque at the carotid bifurcation but no stenosis. ICA bulb widely patent. Cervical ICA is normal. Left carotid system: Common carotid artery widely patent to the bifurcation. Carotid bifurcation is normal without plaque or stenosis. Cervical ICA is normal. Vertebral arteries: Both vertebral arteries widely patent at their origins and through the cervical region to the foramen magnum. Skeleton: Ordinary cervical spondylosis. Other neck: No mass or  lymphadenopathy. Upper chest: Normal Review of the MIP images confirms the above findings CTA HEAD FINDINGS Anterior circulation: Both internal carotid arteries widely patent through the skull base and siphon regions. Ordinary siphon atherosclerotic calcification but without stenosis greater than 30%. The anterior and middle cerebral vessels are normal without proximal stenosis, aneurysm or vascular malformation. No large or medium vessel  occlusion. Posterior circulation: Both vertebral arteries widely patent to the basilar. No basilar stenosis. Posterior circulation branch vessels are normal. Venous sinuses: Patent and normal. Anatomic variants: None significant. Review of the MIP images confirms the above findings IMPRESSION: 1. Mild atherosclerotic disease at the right carotid bifurcation but without stenosis. Left carotid bifurcation normal. 2. No intracranial large or medium vessel occlusion or correctable proximal stenosis. 3. Aortic atherosclerosis. Aortic Atherosclerosis (ICD10-I70.0). Electronically Signed   By: Nelson Chimes M.D.   On: 03/12/2020 10:46   CT CHEST W CONTRAST  Result Date: 03/13/2020 CLINICAL DATA:  64 year old female with history of pancreatic cancer. EXAM: CT CHEST WITH CONTRAST TECHNIQUE: Multidetector CT imaging of the chest was performed during intravenous contrast administration. CONTRAST:  41mL OMNIPAQUE IOHEXOL 300 MG/ML  SOLN COMPARISON:  No priors. FINDINGS: Cardiovascular: Heart size is normal. There is no significant pericardial fluid, thickening or pericardial calcification. 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 No definite coronary artery calcifications. Right internal jugular single-lumen porta cath with tip terminating in the superior aspect of the right atrium. Mediastinum/Nodes: No pathologically enlarged mediastinal or hilar lymph nodes. Esophagus is unremarkable in appearance. No axillary lymphadenopathy.  Lungs/Pleura: No suspicious appearing pulmonary nodules or masses are noted. No acute consolidative airspace disease. No pleural effusions. Upper Abdomen: Extensive pneumobilia. Musculoskeletal: There are no aggressive appearing lytic or blastic lesions noted in the visualized portions of the skeleton. IMPRESSION: 1. No findings to suggest metastatic disease to the thorax. 2. 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 Recommend annual imaging followup by CTA or MRA. This recommendation follows 2010 ACCF/AHA/AATS/ACR/ASA/SCA/SCAI/SIR/STS/SVM Guidelines for the Diagnosis and Management of Patients with Thoracic Aortic Disease. Circulation. 2010; 121: V253-G644. Aortic aneurysm NOS (ICD10-I71.9). Aortic Atherosclerosis (ICD10-I70.0). Electronically Signed   By: Vinnie Langton M.D.   On: 03/13/2020 11:59   MR BRAIN WO CONTRAST  Result Date: 03/12/2020 CLINICAL DATA:  Transit ischemic attack.  Slurred speech. EXAM: MRI HEAD WITHOUT CONTRAST TECHNIQUE: Multiplanar, multiecho pulse sequences of the brain and surrounding structures were obtained without intravenous contrast. COMPARISON:  03/11/2020 head CT.  03/12/2020 CTA head and neck. FINDINGS: Brain: No acute infarct or intracranial hemorrhage. Cerebral volume is within normal limits. Minimal chronic microvascular ischemic changes. No midline shift, ventriculomegaly or extra-axial fluid collection. No mass lesion. Vascular: Please see same day CTA. Skull and upper cervical spine: Normal marrow signal. Sinuses/Orbits: Normal orbits. Clear paranasal sinuses. No mastoid effusion. Other: None. IMPRESSION: No acute intracranial process. Minimal chronic microvascular ischemic changes. Electronically Signed   By: Primitivo Gauze M.D.   On: 03/12/2020 11:49   US Carotid Bilateral (at [facility] and [facility] only)  Result Date: 03/12/2020 CLINICAL DATA:  64 year old female with a history of TIA EXAM: BILATERAL CAROTID DUPLEX  ULTRASOUND TECHNIQUE: Pearline Cables scale imaging, color Doppler and duplex ultrasound were performed of bilateral carotid and vertebral arteries in the neck. COMPARISON:  None. FINDINGS: Criteria: Quantification of carotid stenosis is based on velocity parameters that correlate the residual internal carotid diameter with NASCET-based stenosis levels, using the diameter of the distal internal carotid lumen as the denominator for stenosis measurement. The following velocity measurements were obtained: RIGHT ICA:  Systolic 034 cm/sec, Diastolic 52 cm/sec CCA:  98 cm/sec SYSTOLIC ICA/CCA RATIO:  1.6 ECA:  170 cm/sec LEFT ICA:  Systolic 89 cm/sec, Diastolic 14 cm/sec CCA:  95 cm/sec SYSTOLIC ICA/CCA RATIO:  0.9 ECA:  119 cm/sec Right Brachial SBP: Not acquired Left Brachial SBP:  Not acquired RIGHT CAROTID ARTERY: No significant calcifications of the right common carotid artery. Intermediate waveform maintained. Heterogeneous and partially calcified plaque at the right carotid bifurcation. No significant lumen shadowing. Low resistance waveform of the right ICA. No significant tortuosity. RIGHT VERTEBRAL ARTERY: Antegrade flow with low resistance waveform. LEFT CAROTID ARTERY: No significant calcifications of the left common carotid artery. Intermediate waveform maintained. Heterogeneous and partially calcified plaque at the left carotid bifurcation without significant lumen shadowing. Low resistance waveform of the left ICA. No significant tortuosity. LEFT VERTEBRAL ARTERY:  Antegrade flow with low resistance waveform. IMPRESSION: Right: Heterogeneous and partially calcified plaque at the right carotid bifurcation, with discordant results regarding degree of stenosis by established duplex criteria. Peak velocity suggests 50%-69% stenosis, with the ICA/ CCA ratio suggesting a lesser degree of stenosis. If establishing a more accurate degree of stenosis is required, cerebral angiogram should be considered, or as a second best  test, CTA. Left: Color duplex indicates minimal heterogeneous and calcified plaque, with no hemodynamically significant stenosis by duplex criteria in the extracranial cerebrovascular circulation. Signed, Dulcy Fanny. Dellia Nims, RPVI [facility] Electronically Signed   By: Corrie Mckusick D.O.   On: 03/12/2020 14:02   PERIPHERAL VASCULAR CATHETERIZATION  Result Date: 03/07/2020 See op note  ECHOCARDIOGRAM COMPLETE  Result Date: 03/12/2020    ECHOCARDIOGRAM REPORT   Patient Name:   XIAO GRAUL Date of Exam: 03/12/2020 Medical Rec #:  [id number]   Height:       67.0 in Accession #:    [id number]  Weight:       145.3 lb Date of Birth:  [birth year]   BSA:          1.765 m Patient Age:    45 years    BP:           125/74 mmHg Patient Gender: F           HR:           63 bpm. Exam Location:  [facility] Procedure: 2D Echo, Cardiac Doppler and Color Doppler Indications:     TIA 435.9  History:         Patient has no prior history of Echocardiogram examinations. No                  cardiac hsitory listed.  Sonographer:     Sherrie Sport RDCS (AE) Referring Phys:  [id number] [place] Diagnosing Phys: Kate Sable MD IMPRESSIONS  1. Left ventricular ejection fraction, by estimation, is 65 to 70%. The left ventricle has normal function. The left ventricle has no regional wall motion abnormalities. Left ventricular diastolic parameters were normal.  2. Right ventricular systolic function is normal. The right ventricular size is normal. There is normal pulmonary artery systolic pressure.  3. The mitral valve is normal in structure. No evidence of mitral valve regurgitation. No evidence of mitral stenosis.  4. The aortic valve is normal in structure. Aortic valve regurgitation is not visualized. No aortic stenosis is present.  5. The inferior vena cava is normal in size with greater than 50% respiratory variability, suggesting right atrial pressure of 3 mmHg. FINDINGS   Left Ventricle: Left ventricular ejection fraction, by estimation, is 65 to 70%. The left ventricle has normal function. The left ventricle has no regional wall motion abnormalities. The left ventricular internal cavity size was normal in size. There is  no left ventricular hypertrophy. Left ventricular diastolic parameters were normal. Right Ventricle:  The right ventricular size is normal. No increase in right ventricular wall thickness. Right ventricular systolic function is normal. There is normal pulmonary artery systolic pressure. The tricuspid regurgitant velocity is 2.57 m/s, and  with an assumed right atrial pressure of 3 mmHg, the estimated right ventricular systolic pressure is 41.9 mmHg. Left Atrium: Left atrial size was normal in size. Right Atrium: Right atrial size was normal in size. Pericardium: There is no evidence of pericardial effusion. Mitral Valve: The mitral valve is normal in structure. No evidence of mitral valve regurgitation. No evidence of mitral valve stenosis. Tricuspid Valve: The tricuspid valve is normal in structure. Tricuspid valve regurgitation is not demonstrated. No evidence of tricuspid stenosis. Aortic Valve: The aortic valve is normal in structure. Aortic valve regurgitation is not visualized. No aortic stenosis is present. Aortic valve mean gradient measures 4.5 mmHg. Aortic valve peak gradient measures 7.3 mmHg. Aortic valve area, by VTI measures 3.23 cm. Pulmonic Valve: The pulmonic valve was normal in structure. Pulmonic valve regurgitation is not visualized. No evidence of pulmonic stenosis. Aorta: The aortic root is normal in size and structure. Venous: The inferior vena cava is normal in size with greater than 50% respiratory variability, suggesting right atrial pressure of 3 mmHg. IAS/Shunts: No atrial level shunt detected by color flow Doppler.  LEFT VENTRICLE PLAX 2D LVIDd:         4.00 cm  Diastology LVIDs:         2.28 cm  LV e' medial:    7.72 cm/s LV PW:          1.50 cm  LV E/e' medial:  10.8 LV IVS:        0.90 cm  LV e' lateral:   11.50 cm/s LVOT diam:     2.00 cm  LV E/e' lateral: 7.3 LV SV:         96 LV SV Index:   54 LVOT Area:     3.14 cm  RIGHT VENTRICLE RV Basal diam:  3.77 cm RV S prime:     19.00 cm/s TAPSE (M-mode): 3.7 cm LEFT ATRIUM             Index       RIGHT ATRIUM           Index LA diam:        2.30 cm 1.30 cm/m  RA Area:     16.70 cm LA Vol (A2C):   45.2 ml 25.60 ml/m RA Volume:   45.40 ml  25.72 ml/m LA Vol (A4C):   35.1 ml 19.88 ml/m LA Biplane Vol: 39.1 ml 22.15 ml/m  AORTIC VALVE                   PULMONIC VALVE AV Area (Vmax):    3.22 cm    PV Vmax:        0.79 m/s AV Area (Vmean):   3.07 cm    PV Peak grad:   2.5 mmHg AV Area (VTI):     3.23 cm    RVOT Peak grad: 4 mmHg AV Vmax:           135.50 cm/s AV Vmean:          99.250 cm/s AV VTI:            0.297 m AV Peak Grad:      7.3 mmHg AV Mean Grad:      4.5 mmHg LVOT Vmax:  139.00 cm/s LVOT Vmean:        97.100 cm/s LVOT VTI:          0.305 m LVOT/AV VTI ratio: 1.03  AORTA Ao Root diam: 3.50 cm MITRAL VALVE               TRICUSPID VALVE MV Area (PHT): 2.39 cm    TR Peak grad:   26.4 mmHg MV Decel Time: 317 msec    TR Vmax:        257.00 cm/s MV E velocity: 83.40 cm/s MV A velocity: 72.30 cm/s  SHUNTS MV E/A ratio:  1.15        Systemic VTI:  0.30 m                            Systemic Diam: 2.00 cm Kate Sable MD Electronically signed by Kate Sable MD Signature Date/Time: 03/12/2020/4:01:51 PM    Final    ECHO TEE  Result Date: 03/21/2020    TRANSESOPHOGEAL ECHO REPORT   Patient Name:   HUDA PETREY Date of Exam: 03/21/2020 Medical Rec #:  [id number]   Height:       67.0 in Accession #:    [id number]  Weight:       138.0 lb Date of Birth:  05/26/1956   BSA:          1.727 m Patient Age:    56 years    BP:           133/83 mmHg Patient Gender: F           HR:           83 bpm. Exam Location:  [facility] Procedure: Transesophageal Echo, Color Doppler, Cardiac Doppler and  Saline            Contrast Bubble Study Indications:     Not listed on order  History:         Patient has prior history of Echocardiogram examinations, most                  recent 03/12/2020.  Sonographer:     Sherrie Sport RDCS (AE) Referring Phys:  [place] Diagnosing Phys: Neoma Laming MD PROCEDURE: The transesophogeal probe was passed without difficulty through the esophogus of the patient. Local oropharyngeal anesthetic was provided with Benzocaine spray. Sedation performed by performing physician. The patient's vital signs; including heart rate, blood pressure, and oxygen saturation; remained stable throughout the procedure. The patient developed no complications during the procedure. IMPRESSIONS  1. Left ventricular ejection fraction, by estimation, is 60 to 65%. The left ventricle has normal function. The left ventricle has no regional wall motion abnormalities.  2. Right ventricular systolic function is normal. The right ventricular size is normal.  3. No left atrial/left atrial appendage thrombus was detected.  4. The mitral valve is normal in structure. Mild mitral valve regurgitation. No evidence of mitral stenosis.  5. The aortic valve is abnormal. Aortic valve regurgitation is mild. No aortic stenosis is present.  6. The inferior vena cava is normal in size with greater than 50% respiratory variability, suggesting right atrial pressure of 3 mmHg.  7. Agitated saline contrast bubble study was negative, with no evidence of any interatrial shunt. Conclusion(s)/Recommendation(s): No evidence of vegetation/infective endocarditis on this transesophageal echocardiogram. FINDINGS  Left Ventricle: Left ventricular ejection fraction, by estimation, is 60 to 65%. The left ventricle has normal function. The left  ventricle has no regional wall motion abnormalities. The left ventricular internal cavity size was normal in size. There is  no left ventricular hypertrophy. Right Ventricle: The right ventricular  size is normal. No increase in right ventricular wall thickness. Right ventricular systolic function is normal. Left Atrium: Left atrial size was normal in size. No left atrial/left atrial appendage thrombus was detected. Right Atrium: Right atrial size was normal in size. Pericardium: There is no evidence of pericardial effusion. Mitral Valve: The mitral valve is normal in structure. Mild mitral valve regurgitation. No evidence of mitral valve stenosis. Tricuspid Valve: The tricuspid valve is normal in structure. Tricuspid valve regurgitation is mild . No evidence of tricuspid stenosis. Aortic Valve: The aortic valve is abnormal. Aortic valve regurgitation is mild. No aortic stenosis is present. Pulmonic Valve: The pulmonic valve was normal in structure. Pulmonic valve regurgitation is trivial. No evidence of pulmonic stenosis. Aorta: The aortic root is normal in size and structure. Venous: The inferior vena cava is normal in size with greater than 50% respiratory variability, suggesting right atrial pressure of 3 mmHg. IAS/Shunts: No atrial level shunt detected by color flow Doppler. Agitated saline contrast was given intravenously to evaluate for intracardiac shunting. Agitated saline contrast bubble study was negative, with no evidence of any interatrial shunt. There  is no evidence of a patent foramen ovale. No ventricular septal defect is seen or detected. There is no evidence of an atrial septal defect. Neoma Laming MD Electronically signed by Neoma Laming MD Signature Date/Time: 03/21/2020/8:39:59 AM    Final      ASSESSMENT & PLAN:  1. Primary pancreatic cancer ([place])   2. 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 Labs reviewed and discussed with patient. Proceed with cycle 4 FOLFIRINOX with Udenyca support on day 5.  #Weight loss, refer to nutritionist.  Continue nutrition supplementation  #Irinotecan-associated dysarthria, lip/tongue numbness Irinotecan to be infused over 180 minutes.  Atropine 25 mg once prior to the irinotecan. We will see if this will help her symptoms.also discussed that these adverse symptoms are mainly self limiting.  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 Discussed about other options including gabapentin.  Patient has an appointment with neurology next week and prefers to hold off gabapentin and see neurology first. First dose reduction oxaliplatin to 75mg /m  Supportive care measures are necessary for patient well-being and will be provided as necessary. We spent sufficient time to discuss many aspect of care, questions were answered to patient's satisfaction.   All questions were answered. The patient knows to call the clinic with any problems questions or concerns.  Return of visit: 2 weeks  [name], MD, PhD Hematology Oncology [facility] Pager- [phone] 05/09/2020

## 2020-05-09 NOTE — Progress Notes (Signed)
Patient received prescribed treatment in clinic today. Her Oxaliplatin dose was decreased today because of peripheral neuropathy. During the Irinotecan infusion, patient began complaining of numbness in her tongue. Dr. Tasia Catchings was notified and ordered additional benadryl to be given IV. She was given 50mg  of benadryl IV during her Irinotecan infusion. Later toward the end of her Irinotecan infusion, she began complaining of numbness and tingling in her hands along with her tongue. Infusion was stopped and Dr. Tasia Catchings was notified. Dr. Tasia Catchings evaluated the patient in the infusion suite, and patient was found to be stable.  She received approximately 90% of the Irinotecan before it was stopped due to the hypersensitivity reaction, which apparently occurs every time she receives the medication. All other medications were given without difficulty. VSS. Patient discharged to home in stable condition.

## 2020-05-09 NOTE — Progress Notes (Signed)
Reduce oxal dose to 75mg /m2 per MD

## 2020-05-09 NOTE — Progress Notes (Signed)
Patient was not able to tolerate the Cymbalta due to nausea vomiting and has an appt with neurologist next week to for work up of chemo induced neuropathy.  The day after her last treatment she had rash around her mouth and face.  There is 4 lb wt loss despite having a good appetitive since last tx.

## 2020-05-10 LAB — CANCER ANTIGEN 19-9: CA 19-9: 157 U/mL — ABNORMAL HIGH (ref 0–35)

## 2020-05-12 ENCOUNTER — Inpatient Hospital Stay: Payer: BC Managed Care – PPO

## 2020-05-12 ENCOUNTER — Other Ambulatory Visit: Payer: Self-pay

## 2020-05-12 DIAGNOSIS — C259 Malignant neoplasm of pancreas, unspecified: Secondary | ICD-10-CM

## 2020-05-12 DIAGNOSIS — Z5111 Encounter for antineoplastic chemotherapy: Secondary | ICD-10-CM | POA: Diagnosis not present

## 2020-05-12 MED ORDER — HEPARIN SOD (PORK) LOCK FLUSH 100 UNIT/ML IV SOLN
500.0000 [IU] | Freq: Once | INTRAVENOUS | Status: AC | PRN
  Administered 2020-05-12: 500 [IU]
  Filled 2020-05-12: qty 5

## 2020-05-12 MED ORDER — HEPARIN SOD (PORK) LOCK FLUSH 100 UNIT/ML IV SOLN
INTRAVENOUS | Status: AC
  Filled 2020-05-12: qty 5

## 2020-05-12 MED ORDER — SODIUM CHLORIDE 0.9% FLUSH
10.0000 mL | INTRAVENOUS | Status: DC | PRN
  Administered 2020-05-12: 10 mL
  Filled 2020-05-12: qty 10

## 2020-05-12 NOTE — Progress Notes (Signed)
64fu pump removed. Stable at discharge

## 2020-05-13 ENCOUNTER — Inpatient Hospital Stay: Payer: BC Managed Care – PPO

## 2020-05-13 DIAGNOSIS — Z5111 Encounter for antineoplastic chemotherapy: Secondary | ICD-10-CM | POA: Diagnosis not present

## 2020-05-13 DIAGNOSIS — C259 Malignant neoplasm of pancreas, unspecified: Secondary | ICD-10-CM

## 2020-05-13 MED ORDER — PEGFILGRASTIM-CBQV 6 MG/0.6ML ~~LOC~~ SOSY
6.0000 mg | PREFILLED_SYRINGE | Freq: Once | SUBCUTANEOUS | Status: AC
  Administered 2020-05-13: 6 mg via SUBCUTANEOUS
  Filled 2020-05-13: qty 0.6

## 2020-05-15 ENCOUNTER — Telehealth: Payer: Self-pay | Admitting: Oncology

## 2020-05-15 NOTE — Telephone Encounter (Signed)
Ok to postpone to later that week depending on the availability.

## 2020-05-15 NOTE — Telephone Encounter (Signed)
Please see if there is room for pt to do lab/MD & treatment to later in the week of 11/29. Please contact pt with new appt details.

## 2020-05-15 NOTE — Telephone Encounter (Signed)
A detailed message was left on pts VM making her aware that the Infusion  Is already at the max capacity for the week of 11/29. Per MD if pt wants  move appt out past that week, then OK to do so.  Appts are still sched for 11/30 unless pt request otherwise.

## 2020-05-15 NOTE — Telephone Encounter (Signed)
Please advise 

## 2020-05-27 ENCOUNTER — Ambulatory Visit: Payer: BC Managed Care – PPO | Admitting: Oncology

## 2020-05-27 ENCOUNTER — Ambulatory Visit: Payer: BC Managed Care – PPO

## 2020-05-27 ENCOUNTER — Telehealth: Payer: Self-pay | Admitting: Adult Health

## 2020-05-27 ENCOUNTER — Other Ambulatory Visit: Payer: BC Managed Care – PPO

## 2020-05-27 DIAGNOSIS — E7800 Pure hypercholesterolemia, unspecified: Secondary | ICD-10-CM

## 2020-05-27 MED ORDER — ATORVASTATIN CALCIUM 40 MG PO TABS: 40.0000 mg | ORAL_TABLET | Freq: Every day | ORAL | 3 refills | Status: DC

## 2020-05-27 NOTE — Telephone Encounter (Signed)
[facility] faxed refill request for the following medications:  atorvastatin (LIPITOR) 40 MG tablet   Please advise. Thanks, [employer]

## 2020-05-30 ENCOUNTER — Ambulatory Visit: Payer: BC Managed Care – PPO

## 2020-05-30 NOTE — Telephone Encounter (Signed)
Disregard request  medication filled 05/27/2020

## 2020-06-03 ENCOUNTER — Other Ambulatory Visit: Payer: Self-pay | Admitting: Oncology

## 2020-06-03 ENCOUNTER — Other Ambulatory Visit: Payer: Self-pay

## 2020-06-03 ENCOUNTER — Encounter: Payer: Self-pay | Admitting: Oncology

## 2020-06-03 ENCOUNTER — Inpatient Hospital Stay (HOSPITAL_BASED_OUTPATIENT_CLINIC_OR_DEPARTMENT_OTHER): Payer: BC Managed Care – PPO | Admitting: Oncology

## 2020-06-03 ENCOUNTER — Inpatient Hospital Stay: Payer: BC Managed Care – PPO | Attending: Oncology

## 2020-06-03 ENCOUNTER — Inpatient Hospital Stay: Payer: BC Managed Care – PPO

## 2020-06-03 VITALS — BP 126/81 | HR 66 | Temp 98.5°F | Resp 18 | Wt 129.3 lb

## 2020-06-03 DIAGNOSIS — T451X5A Adverse effect of antineoplastic and immunosuppressive drugs, initial encounter: Secondary | ICD-10-CM

## 2020-06-03 DIAGNOSIS — C250 Malignant neoplasm of head of pancreas: Secondary | ICD-10-CM | POA: Insufficient documentation

## 2020-06-03 DIAGNOSIS — R471 Dysarthria and anarthria: Secondary | ICD-10-CM

## 2020-06-03 DIAGNOSIS — G620 Drug-induced polyneuropathy: Secondary | ICD-10-CM

## 2020-06-03 DIAGNOSIS — Z5189 Encounter for other specified aftercare: Secondary | ICD-10-CM | POA: Diagnosis not present

## 2020-06-03 DIAGNOSIS — C787 Secondary malignant neoplasm of liver and intrahepatic bile duct: Secondary | ICD-10-CM | POA: Diagnosis present

## 2020-06-03 DIAGNOSIS — C259 Malignant neoplasm of pancreas, unspecified: Secondary | ICD-10-CM

## 2020-06-03 DIAGNOSIS — Z5111 Encounter for antineoplastic chemotherapy: Secondary | ICD-10-CM

## 2020-06-03 LAB — COMPREHENSIVE METABOLIC PANEL
ALT: 43 U/L (ref 0–44)
AST: 38 U/L (ref 15–41)
Albumin: 3.9 g/dL (ref 3.5–5.0)
Alkaline Phosphatase: 133 U/L — ABNORMAL HIGH (ref 38–126)
Anion gap: 10 (ref 5–15)
BUN: 18 mg/dL (ref 8–23)
CO2: 26 mmol/L (ref 22–32)
Calcium: 9 mg/dL (ref 8.9–10.3)
Chloride: 102 mmol/L (ref 98–111)
Creatinine, Ser: 0.64 mg/dL (ref 0.44–1.00)
GFR, Estimated: >60 mL/min (ref >60)
Glucose, Bld: 114 mg/dL — ABNORMAL HIGH (ref 70–99)
Potassium: 3.8 mmol/L (ref 3.5–5.1)
Sodium: 138 mmol/L (ref 135–145)
Total Bilirubin: 0.6 mg/dL (ref 0.3–1.2)
Total Protein: 6.3 g/dL — ABNORMAL LOW (ref 6.5–8.1)

## 2020-06-03 LAB — CBC WITH DIFFERENTIAL/PLATELET
Abs Immature Granulocytes: 0.02 10*3/uL (ref 0.00–0.07)
Basophils Absolute: 0 10*3/uL (ref 0.0–0.1)
Basophils Relative: 1 %
Eosinophils Absolute: 0.1 10*3/uL (ref 0.0–0.5)
Eosinophils Relative: 2 %
HCT: 33.3 % — ABNORMAL LOW (ref 36.0–46.0)
Hemoglobin: 11.4 g/dL — ABNORMAL LOW (ref 12.0–15.0)
Immature Granulocytes: 0 %
Lymphocytes Relative: 28 %
Lymphs Abs: 1.4 10*3/uL (ref 0.7–4.0)
MCH: 33.1 pg (ref 26.0–34.0)
MCHC: 34.2 g/dL (ref 30.0–36.0)
MCV: 96.8 fL (ref 80.0–100.0)
Monocytes Absolute: 0.5 10*3/uL (ref 0.1–1.0)
Monocytes Relative: 11 %
Neutro Abs: 2.9 10*3/uL (ref 1.7–7.7)
Neutrophils Relative %: 58 %
Platelets: 205 10*3/uL (ref 150–400)
RBC: 3.44 MIL/uL — ABNORMAL LOW (ref 3.87–5.11)
RDW: 15 % (ref 11.5–15.5)
WBC: 5.1 10*3/uL (ref 4.0–10.5)
nRBC: 0 % (ref 0.0–0.2)

## 2020-06-03 MED ORDER — DEXTROSE 5 % IV SOLN
Freq: Once | INTRAVENOUS | Status: AC
  Filled 2020-06-03: qty 250

## 2020-06-03 MED ORDER — LEUCOVORIN CALCIUM INJECTION 350 MG
700.0000 mg | Freq: Once | INTRAVENOUS | Status: AC
  Administered 2020-06-03: 700 mg via INTRAVENOUS
  Filled 2020-06-03: qty 35

## 2020-06-03 MED ORDER — SODIUM CHLORIDE 0.9% FLUSH
10.0000 mL | INTRAVENOUS | Status: DC | PRN
  Administered 2020-06-03: 10 mL
  Filled 2020-06-03: qty 10

## 2020-06-03 MED ORDER — SODIUM CHLORIDE 0.9 % IV SOLN
150.0000 mg/m2 | Freq: Once | INTRAVENOUS | Status: AC
  Administered 2020-06-03: 260 mg via INTRAVENOUS
  Filled 2020-06-03: qty 10

## 2020-06-03 MED ORDER — SODIUM CHLORIDE 0.9 % IV SOLN
2400.0000 mg/m2 | INTRAVENOUS | Status: DC
  Administered 2020-06-03: 4200 mg via INTRAVENOUS
  Filled 2020-06-03: qty 84

## 2020-06-03 MED ORDER — SODIUM CHLORIDE 0.9 % IV SOLN: 400.0000 mg/m2 | Freq: Once | INTRAVENOUS | Status: DC

## 2020-06-03 MED ORDER — PALONOSETRON HCL INJECTION 0.25 MG/5ML
0.2500 mg | Freq: Once | INTRAVENOUS | Status: AC
  Administered 2020-06-03: 0.25 mg via INTRAVENOUS
  Filled 2020-06-03: qty 5

## 2020-06-03 MED ORDER — OXALIPLATIN CHEMO INJECTION 100 MG/20ML
75.0000 mg/m2 | Freq: Once | INTRAVENOUS | Status: AC
  Administered 2020-06-03: 130 mg via INTRAVENOUS
  Filled 2020-06-03: qty 10

## 2020-06-03 MED ORDER — DIPHENHYDRAMINE HCL 25 MG PO CAPS
50.0000 mg | ORAL_CAPSULE | Freq: Once | ORAL | Status: AC
  Administered 2020-06-03: 50 mg via ORAL
  Filled 2020-06-03: qty 2

## 2020-06-03 MED ORDER — DEXTROSE 5 % IV SOLN
Freq: Once | INTRAVENOUS | Status: DC
  Filled 2020-06-03: qty 250

## 2020-06-03 MED ORDER — SODIUM CHLORIDE 0.9 % IV SOLN
150.0000 mg | Freq: Once | INTRAVENOUS | Status: AC
  Administered 2020-06-03: 150 mg via INTRAVENOUS
  Filled 2020-06-03: qty 150

## 2020-06-03 MED ORDER — DIPHENHYDRAMINE HCL 25 MG PO CAPS
25.0000 mg | ORAL_CAPSULE | Freq: Once | ORAL | Status: AC
  Administered 2020-06-03: 25 mg via ORAL

## 2020-06-03 MED ORDER — LEUCOVORIN CALCIUM INJECTION 100 MG: 20.0000 mg/m2 | Freq: Once | INTRAMUSCULAR | Status: DC

## 2020-06-03 MED ORDER — SODIUM CHLORIDE 0.9 % IV SOLN
10.0000 mg | Freq: Once | INTRAVENOUS | Status: AC
  Administered 2020-06-03: 10 mg via INTRAVENOUS
  Filled 2020-06-03: qty 10

## 2020-06-03 MED ORDER — ATROPINE SULFATE 1 MG/ML IJ SOLN
0.5000 mg | Freq: Once | INTRAMUSCULAR | Status: AC
  Administered 2020-06-03: 0.5 mg via INTRAVENOUS
  Filled 2020-06-03: qty 1

## 2020-06-03 MED ORDER — DIPHENHYDRAMINE HCL 25 MG PO CAPS
ORAL_CAPSULE | ORAL | Status: AC
  Filled 2020-06-03: qty 1

## 2020-06-03 NOTE — Progress Notes (Signed)
Patient occasionally has an oily film in toilet with bowel movements.

## 2020-06-03 NOTE — Progress Notes (Signed)
Hematology/Oncology follow up note [facility] [phone] [phone]   Patient Care Team: Flinchum, Kelby Aline, FNP as PCP - General (Family Medicine) Flinchum, Kelby Aline, FNP (Family Medicine) [name], RN as Oncology Nurse Navigator [name], MD as Consulting Physician (Oncology)  REFERRING PROVIDER: Sharmon Leyden*  CHIEF COMPLAINTS/REASON FOR VISIT:  Follow up for treatment of pancreatic adenocarcinoma  HISTORY OF PRESENTING ILLNESS:   Cassandra [name] is a  64 y.o.  female with PMH listed below was seen in consultation at the request of  Flinchum, Michelle S, F*  for evaluation of pancreatic adenocarcinoma Patient initially presented with jaundice, transaminitis, bilirubin was 9.9.  CA 19-9 was 1874.  Patient also reports unintentional weight loss. 02/08/2020 MRI abdomen and MRCP with and without contrast was done at [facility] which showed pancreatic head mass measuring up to 3 cm, with marked associated narrowing of the portal vein confluence.  SMA is preserved.  Marked intrahepatic and extrahepatic biliary duct dilatation as well as mild dilatation of the main pancreatic duct. Multiple hepatic masses highly concerning for metastatic disease.  Patient underwent EUS on 02/19/2020, which showed irregular mass identified in the pancreatic head, hypoechoic, measured 55mmx33mm, sonographic evidence concerning for invasion into the superior mesenteric artery.  There is no sign of significant abnormality in the main pancreatic duct.  Dilatation of common bile duct which measured up to 16 mm.  Region of celiac artery was visualized and showed no signs of significant abnormality.  No lymphadenopathy.  FNA showed adenocarcinoma.  02/19/2020, ERCP, malignant.  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  The stricture was treated with placement of wall flex metal stent.  Patient was  seen by [facility] oncology Dr. Pia Mau and was recommended for 3 drug regimen FOLFIRINOX.  Patient prefers to do chemotherapy locally at [facility].  Patient was referred to establish care today. She denies any pain.  Since stent placement, skin jaundice has improved.  Itchiness has also improved. Patient was accompanied by her husband today.  She has a family history of breast cancer in sister and paternal aunt, colon cancer paternal grandmother.  #no reportable targetable mutation on NGS 9/14/2021cycle 1 FOLFIRINOX.  Patient received oxaliplatin and about 50% of Irinotecan on day 1 and had experienced neurologic symptoms.  She went to ER and working diagnosis is TIA. # 03/19/2020-03/21/2020 patient was admitted due to sepsis with strep pneumonia bacteremia.  Patient was treated with IV Rocephin.  TEE was done which showed no vegetation.  No PFO or ASD.  Patient was discharged home and he finished full course of 14 days of IV Rocephin on 04/02/2020 per ID recommendation.  Repeat blood culture was also negative.    INTERVAL HISTORY Cassandra [name] is a 64 y.o. female who has above history reviewed by me today presents for acute visit.   Problems and complaints are listed below Patient reports feeling well today Patient reports feeling well.  No nausea vomiting diarrhea.  Appetite is good.  She has lost 4 pounds. Her first grandchild was born since last visit.  She is very excited about the Occasionally she had oil contained bowel movement. She has also establish care with neurology Dr. Manuella Ghazi and was recommended for observation at this point.   Review of Systems  Constitutional: Positive for unexpected weight change. Negative for chills, fatigue and fever.  HENT:   Negative for hearing loss and voice change.  Eyes: Negative for eye problems.  Respiratory: Negative for chest tightness and cough.   Cardiovascular: Negative for chest pain.  Gastrointestinal: Negative for abdominal distention, abdominal pain and  blood in stool.  Endocrine: Negative for hot flashes.  Genitourinary: Negative for difficulty urinating and frequency.   Musculoskeletal: Negative for arthralgias.  Skin: Negative for itching and rash.  Neurological: Positive for numbness. Negative for extremity weakness.  Hematological: Negative for adenopathy.  Psychiatric/Behavioral: Negative for confusion.    MEDICAL HISTORY:  Past Medical History:  Diagnosis Date  . Cancer [facility])    pancreatic cancer  . Colon polyps   . Family history of breast cancer   . Neutropenia ([place]) 04/18/2020  . Osteopenia after menopause 05/2017   femoral neck T score -2.0    SURGICAL HISTORY: Past Surgical History:  Procedure Laterality Date  . COLONOSCOPY  07/2015   WNL  . COLONOSCOPY  2008/2011  . OVARIAN CYST REMOVAL  1992   dermoid-Dr [place]  . PORTA CATH INSERTION N/A 03/07/2020   Procedure: PORTA CATH INSERTION;  Surgeon: Algernon Huxley, MD;  Location: [facility];  Service: Cardiovascular;  Laterality: N/A;  . TEE WITHOUT CARDIOVERSION N/A 03/21/2020   Procedure: TRANSESOPHAGEAL ECHOCARDIOGRAM (TEE);  Surgeon: Dionisio David, MD;  Location: [facility] ORS;  Service: Cardiovascular;  Laterality: N/A;  . TUBAL LIGATION  1993    SOCIAL HISTORY: Social History   Socioeconomic History  . Marital status: Married    Spouse name: Not on file  . Number of children: 2  . Years of education: Not on file  . Highest education level: Not on file  Occupational History  . Occupation: Pharmacist, hospital    Comment: retired  . Occupation: Farmer  Tobacco Use  . Smoking status: Former Smoker    Packs/day: 0.50    Years: 10.00    Pack years: 5.00    Types: Cigarettes    Quit date: 1987    Years since quitting: 34.9  . Smokeless tobacco: Never Used  . Tobacco comment: Quit smoking 1987  Vaping Use  . Vaping Use: Never used  Substance and Sexual Activity  . Alcohol use: Not Currently    Comment: 0-2 mixed drinks a day  . Drug use: No  . Sexual  activity: Not Currently    Birth control/protection: Post-menopausal  Other Topics Concern  . Not on file  Social History Narrative  . Not on file   Social Determinants of Health   Financial Resource Strain:   . Difficulty of Paying Living Expenses: Not on file  Food Insecurity:   . Worried About Charity fundraiser in the Last Year: Not on file  . Ran Out of Food in the Last Year: Not on file  Transportation Needs:   . Lack of Transportation (Medical): Not on file  . Lack of Transportation (Non-Medical): Not on file  Physical Activity:   . Days of Exercise per Week: Not on file  . Minutes of Exercise per Session: Not on file  Stress:   . Feeling of Stress : Not on file  Social Connections:   . Frequency of Communication with Friends and Family: Not on file  . Frequency of Social Gatherings with Friends and Family: Not on file  . Attends Religious Services: Not on file  . Active Member of Clubs or Organizations: Not on file  . Attends Archivist Meetings: Not on file  . Marital Status: Not on file  Intimate Partner Violence:   .  Fear of Current or Ex-Partner: Not on file  . Emotionally Abused: Not on file  . Physically Abused: Not on file  . Sexually Abused: Not on file    FAMILY HISTORY: Family History  Problem Relation Age of Onset  . Breast cancer Paternal Aunt 3  . Diabetes Mother   . Osteoporosis Mother   . Hyperlipidemia Father   . Rheumatic fever Father   . Valvular heart disease Father   . Cancer Paternal Grandmother        possible colon  . Breast cancer Sister 59    ALLERGIES:  is allergic to penicillin g.  MEDICATIONS:  Current Outpatient Medications  Medication Sig Dispense Refill  . aspirin EC 81 MG tablet Take 1 tablet (81 mg total) by mouth daily. Swallow whole. 150 tablet 2  . atorvastatin (LIPITOR) 40 MG tablet Take 1 tablet (40 mg total) by mouth daily. 30 tablet 3  . Cholecalciferol 25 MCG (1000 UT) tablet Take 2,000 Units by  mouth daily.    Marland Kitchen lidocaine-prilocaine (EMLA) cream Apply to affected area once (Patient taking differently: Apply 1 application topically as directed. ) 30 g 3  . loperamide (IMODIUM A-D) 2 MG tablet Take 2 at onset of diarrhea, then 1 every 2hrs until 12hr without a BM. May take 2 tab every 4hrs at bedtime. If diarrhea recurs repeat. 100 tablet 1  . magic mouthwash w/lidocaine SOLN Take 5 mLs by mouth 4 (four) times daily as needed for mouth pain. Sig: Swish & Spit 5-10 ml four times a day as needed. Dispense 480 ml. 1RF 480 mL 1  . ondansetron (ZOFRAN) 8 MG tablet Take 1 tablet (8 mg total) by mouth 2 (two) times daily as needed. Start on day 3 after chemotherapy. 30 tablet 1  . prochlorperazine (COMPAZINE) 10 MG tablet Take 1 tablet (10 mg total) by mouth every 6 (six) hours as needed (Nausea or vomiting). 30 tablet 1  . simethicone (GAS-X) 80 MG chewable tablet Chew 1 tablet (80 mg total) by mouth every 8 (eight) hours as needed for flatulence. 60 tablet 0   No current facility-administered medications for this visit.     PHYSICAL EXAMINATION: ECOG PERFORMANCE STATUS: 1 - Symptomatic but completely ambulatory Vitals:   06/03/20 0833  BP: 126/81  Pulse: 66  Resp: 18  Temp: 98.5 F (36.9 C)   Filed Weights   06/03/20 0833  Weight: 129 lb 4.8 oz (58.7 kg)    Physical Exam Constitutional:      General: She is not in acute distress. HENT:     Head: Normocephalic and atraumatic.  Eyes:     General: No scleral icterus. Cardiovascular:     Rate and Rhythm: Normal rate and regular rhythm.     Heart sounds: Normal heart sounds.  Pulmonary:     Effort: Pulmonary effort is normal. No respiratory distress.     Breath sounds: No wheezing.  Abdominal:     General: Bowel sounds are normal. There is no distension.     Palpations: Abdomen is soft.  Musculoskeletal:        General: No deformity. Normal range of motion.     Cervical back: Normal range of motion and neck supple.   Skin:    General: Skin is warm and dry.     Coloration: Skin is not jaundiced.     Findings: No erythema or rash.  Neurological:     Mental Status: She is alert and oriented to person, place, and time.  Mental status is at baseline.     Cranial Nerves: No cranial nerve deficit.     Coordination: Coordination normal.  Psychiatric:        Mood and Affect: Mood normal.     LABORATORY DATA:  I have reviewed the data as listed Lab Results  Component Value Date   WBC 8.9 05/09/2020   HGB 10.8 (L) 05/09/2020   HCT 30.9 (L) 05/09/2020   MCV 95.7 05/09/2020   PLT 155 05/09/2020   Recent Labs    02/06/20 1049 02/29/20 1314 03/19/20 1024 03/19/20 1024 03/20/20 0411 03/21/20 0318 04/04/20 0827 04/21/20 1439 04/25/20 0805 05/09/20 0809  NA 137   < > 129*   < > 136 140   < > 141 138 142  K 4.1   < > 3.8   < > 4.0 3.7   < > 4.1 4.1 3.4*  CL 99   < > 95*   < > 104 108   < > 107 104 107  CO2 23   < > 25   < > 25 25   < > 28 26 26   GLUCOSE 113*   < > 145*   < > 126* 114*   < > 96 103* 147*  BUN 11   < > 11   < > 7* 6*   < > 9 11 8   CREATININE 0.80   < > 0.85   < > 0.66 0.52   < > 0.70 0.68 0.84  CALCIUM 10.2   < > 8.9   < > 8.3* 8.2*   < > 8.3* 8.8* 8.8*  GFRNONAA 78   < > >60   < > >60 >60   < > >60 >60 >60  GFRAA 90   < > >60  --  >60 >60  --   --   --   --   PROT 7.0   < > 7.1   < >  --  5.3*   < > 5.9* 6.4* 6.1*  ALBUMIN 4.9*   < > 3.5   < >  --  2.5*   < > 3.6 3.8 3.6  AST 195*   < > 30   < >  --  52*   < > 33 28 35  ALT 366*   < > 43   < >  --  74*   < > 44 38 38  ALKPHOS 934*   < > 125   < >  --  112   < > 126 131* 169*  BILITOT 9.9*   < > 1.5*   < >  --  1.0   < > 0.6 0.6 0.4  BILIDIR 8.11*  --   --   --   --   --   --   --   --   --   IBILI 1.79*  --   --   --   --   --   --   --   --   --    < > = values in this interval not displayed.   Iron/TIBC/Ferritin/ %Sat No results found for: IRON, TIBC, FERRITIN, IRONPCTSAT    RADIOGRAPHIC STUDIES: I have personally  reviewed the radiological images as listed and agreed with the findings in the report. Reviewed findings of MRI abdomen MRCP done at [facility]. CT ANGIO HEAD W OR WO CONTRAST  Result Date: 03/12/2020 CLINICAL DATA:  Slurred speech.  Pancreatic cancer patient. EXAM: CT ANGIOGRAPHY  HEAD AND NECK TECHNIQUE: Multidetector CT imaging of the head and neck was performed using the standard protocol during bolus administration of intravenous contrast. Multiplanar CT image reconstructions and MIPs were obtained to evaluate the vascular anatomy. Carotid stenosis measurements (when applicable) are obtained utilizing NASCET criteria, using the distal internal carotid diameter as the denominator. CONTRAST:  46mL OMNIPAQUE IOHEXOL 350 MG/ML SOLN COMPARISON:  Head CT yesterday. FINDINGS: CT HEAD FINDINGS Brain: No evidence of acute infarction. No accelerated brain atrophy. No mass, hemorrhage, hydrocephalus or extra-axial collection. Vascular: There is atherosclerotic calcification of the major vessels at the base of the brain. Skull: Negative Sinuses: Clear Orbits: Normal Review of the MIP images confirms the above findings CTA NECK FINDINGS Aortic arch: Aortic atherosclerosis. Branching pattern is normal without origin stenosis. Right carotid system: Common carotid artery widely patent to the bifurcation. Calcified plaque at the carotid bifurcation but no stenosis. ICA bulb widely patent. Cervical ICA is normal. Left carotid system: Common carotid artery widely patent to the bifurcation. Carotid bifurcation is normal without plaque or stenosis. Cervical ICA is normal. Vertebral arteries: Both vertebral arteries widely patent at their origins and through the cervical region to the foramen magnum. Skeleton: Ordinary cervical spondylosis. Other neck: No mass or lymphadenopathy. Upper chest: Normal Review of the MIP images confirms the above findings CTA HEAD FINDINGS Anterior circulation: Both internal carotid arteries widely patent  through the skull base and siphon regions. Ordinary siphon atherosclerotic calcification but without stenosis greater than 30%. The anterior and middle cerebral vessels are normal without proximal stenosis, aneurysm or vascular malformation. No large or medium vessel occlusion. Posterior circulation: Both vertebral arteries widely patent to the basilar. No basilar stenosis. Posterior circulation branch vessels are normal. Venous sinuses: Patent and normal. Anatomic variants: None significant. Review of the MIP images confirms the above findings IMPRESSION: 1. Mild atherosclerotic disease at the right carotid bifurcation but without stenosis. Left carotid bifurcation normal. 2. No intracranial large or medium vessel occlusion or correctable proximal stenosis. 3. Aortic atherosclerosis. Aortic Atherosclerosis (ICD10-I70.0). Electronically Signed   By: Nelson Chimes M.D.   On: 03/12/2020 10:46   DG Chest 2 View  Result Date: 03/19/2020 CLINICAL DATA:  Bacteremia, evaluate for pneumonia. EXAM: CHEST - 2 VIEW COMPARISON:  Prior chest CT 03/13/2020. FINDINGS: A right chest infusion port catheter is present with tip terminating in the region of the superior cavoatrial junction. Heart size within normal limits. Aortic atherosclerosis. No appreciable airspace consolidation. No evidence of pleural effusion or pneumothorax. No acute bony abnormality identified. IMPRESSION: No evidence of active cardiopulmonary disease. Aortic Atherosclerosis (ICD10-I70.0). Electronically Signed   By: Kellie Simmering DO   On: 03/19/2020 12:19   CT HEAD WO CONTRAST  Result Date: 03/11/2020 CLINICAL DATA:  Slurred speech EXAM: CT HEAD WITHOUT CONTRAST TECHNIQUE: Contiguous axial images were obtained from the base of the skull through the vertex without intravenous contrast. COMPARISON:  None. FINDINGS: Brain: No evidence of acute territorial infarction, hemorrhage, hydrocephalus,extra-axial collection or mass lesion/mass effect. Normal  gray-white differentiation. Ventricles are normal in size and contour. Vascular: No hyperdense vessel or unexpected calcification. Skull: The skull is intact. No fracture or focal lesion identified. Sinuses/Orbits: The visualized paranasal sinuses and mastoid air cells are clear. The orbits and globes intact. Other: None IMPRESSION: No acute intracranial abnormality. Electronically Signed   By: Prudencio Pair M.D.   On: 03/11/2020 16:48   CT ANGIO NECK W OR WO CONTRAST  Result Date: 03/12/2020 CLINICAL DATA:  Slurred speech.  Pancreatic cancer  patient. EXAM: CT ANGIOGRAPHY HEAD AND NECK TECHNIQUE: Multidetector CT imaging of the head and neck was performed using the standard protocol during bolus administration of intravenous contrast. Multiplanar CT image reconstructions and MIPs were obtained to evaluate the vascular anatomy. Carotid stenosis measurements (when applicable) are obtained utilizing NASCET criteria, using the distal internal carotid diameter as the denominator. CONTRAST:  38mL OMNIPAQUE IOHEXOL 350 MG/ML SOLN COMPARISON:  Head CT yesterday. FINDINGS: CT HEAD FINDINGS Brain: No evidence of acute infarction. No accelerated brain atrophy. No mass, hemorrhage, hydrocephalus or extra-axial collection. Vascular: There is atherosclerotic calcification of the major vessels at the base of the brain. Skull: Negative Sinuses: Clear Orbits: Normal Review of the MIP images confirms the above findings CTA NECK FINDINGS Aortic arch: Aortic atherosclerosis. Branching pattern is normal without origin stenosis. Right carotid system: Common carotid artery widely patent to the bifurcation. Calcified plaque at the carotid bifurcation but no stenosis. ICA bulb widely patent. Cervical ICA is normal. Left carotid system: Common carotid artery widely patent to the bifurcation. Carotid bifurcation is normal without plaque or stenosis. Cervical ICA is normal. Vertebral arteries: Both vertebral arteries widely patent at their  origins and through the cervical region to the foramen magnum. Skeleton: Ordinary cervical spondylosis. Other neck: No mass or lymphadenopathy. Upper chest: Normal Review of the MIP images confirms the above findings CTA HEAD FINDINGS Anterior circulation: Both internal carotid arteries widely patent through the skull base and siphon regions. Ordinary siphon atherosclerotic calcification but without stenosis greater than 30%. The anterior and middle cerebral vessels are normal without proximal stenosis, aneurysm or vascular malformation. No large or medium vessel occlusion. Posterior circulation: Both vertebral arteries widely patent to the basilar. No basilar stenosis. Posterior circulation branch vessels are normal. Venous sinuses: Patent and normal. Anatomic variants: None significant. Review of the MIP images confirms the above findings IMPRESSION: 1. Mild atherosclerotic disease at the right carotid bifurcation but without stenosis. Left carotid bifurcation normal. 2. No intracranial large or medium vessel occlusion or correctable proximal stenosis. 3. Aortic atherosclerosis. Aortic Atherosclerosis (ICD10-I70.0). Electronically Signed   By: Nelson Chimes M.D.   On: 03/12/2020 10:46   CT CHEST W CONTRAST  Result Date: 03/13/2020 CLINICAL DATA:  64 year old female with history of pancreatic cancer. EXAM: CT CHEST WITH CONTRAST TECHNIQUE: Multidetector CT imaging of the chest was performed during intravenous contrast administration. CONTRAST:  22mL OMNIPAQUE IOHEXOL 300 MG/ML  SOLN COMPARISON:  No priors. FINDINGS: Cardiovascular: Heart size is normal. There is no significant pericardial fluid, thickening or pericardial calcification. 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 No definite coronary artery calcifications. Right internal jugular single-lumen porta cath with tip terminating in the superior aspect of the right atrium. Mediastinum/Nodes: No  pathologically enlarged mediastinal or hilar lymph nodes. Esophagus is unremarkable in appearance. No axillary lymphadenopathy. Lungs/Pleura: No suspicious appearing pulmonary nodules or masses are noted. No acute consolidative airspace disease. No pleural effusions. Upper Abdomen: Extensive pneumobilia. Musculoskeletal: There are no aggressive appearing lytic or blastic lesions noted in the visualized portions of the skeleton. IMPRESSION: 1. No findings to suggest metastatic disease to the thorax. 2. 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 Recommend annual imaging followup by CTA or MRA. This recommendation follows 2010 ACCF/AHA/AATS/ACR/ASA/SCA/SCAI/SIR/STS/SVM Guidelines for the Diagnosis and Management of Patients with Thoracic Aortic Disease. Circulation. 2010; 121: [id number]. Aortic aneurysm NOS (ICD10-I71.9). Aortic Atherosclerosis (ICD10-I70.0). Electronically Signed   By: Vinnie Langton M.D.   On: 03/13/2020  11:59   MR BRAIN WO CONTRAST  Result Date: 03/12/2020 CLINICAL DATA:  Transit ischemic attack.  Slurred speech. EXAM: MRI HEAD WITHOUT CONTRAST TECHNIQUE: Multiplanar, multiecho pulse sequences of the brain and surrounding structures were obtained without intravenous contrast. COMPARISON:  03/11/2020 head CT.  03/12/2020 CTA head and neck. FINDINGS: Brain: No acute infarct or intracranial hemorrhage. Cerebral volume is within normal limits. Minimal chronic microvascular ischemic changes. No midline shift, ventriculomegaly or extra-axial fluid collection. No mass lesion. Vascular: Please see same day CTA. Skull and upper cervical spine: Normal marrow signal. Sinuses/Orbits: Normal orbits. Clear paranasal sinuses. No mastoid effusion. Other: None. IMPRESSION: No acute intracranial process. Minimal chronic microvascular ischemic changes. Electronically Signed   By: Primitivo Gauze M.D.   On: 03/12/2020 11:49   US Carotid Bilateral (at [facility] and  AP only)  Result Date: 03/12/2020 CLINICAL DATA:  64 year old female with a history of TIA EXAM: BILATERAL CAROTID DUPLEX ULTRASOUND TECHNIQUE: Pearline Cables scale imaging, color Doppler and duplex ultrasound were performed of bilateral carotid and vertebral arteries in the neck. COMPARISON:  None. FINDINGS: Criteria: Quantification of carotid stenosis is based on velocity parameters that correlate the residual internal carotid diameter with NASCET-based stenosis levels, using the diameter of the distal internal carotid lumen as the denominator for stenosis measurement. The following velocity measurements were obtained: RIGHT ICA:  Systolic 268 cm/sec, Diastolic 52 cm/sec CCA:  98 cm/sec SYSTOLIC ICA/CCA RATIO:  1.6 ECA:  170 cm/sec LEFT ICA:  Systolic 89 cm/sec, Diastolic 14 cm/sec CCA:  95 cm/sec SYSTOLIC ICA/CCA RATIO:  0.9 ECA:  119 cm/sec Right Brachial SBP: Not acquired Left Brachial SBP: Not acquired RIGHT CAROTID ARTERY: No significant calcifications of the right common carotid artery. Intermediate waveform maintained. Heterogeneous and partially calcified plaque at the right carotid bifurcation. No significant lumen shadowing. Low resistance waveform of the right ICA. No significant tortuosity. RIGHT VERTEBRAL ARTERY: Antegrade flow with low resistance waveform. LEFT CAROTID ARTERY: No significant calcifications of the left common carotid artery. Intermediate waveform maintained. Heterogeneous and partially calcified plaque at the left carotid bifurcation without significant lumen shadowing. Low resistance waveform of the left ICA. No significant tortuosity. LEFT VERTEBRAL ARTERY:  Antegrade flow with low resistance waveform. IMPRESSION: Right: Heterogeneous and partially calcified plaque at the right carotid bifurcation, with discordant results regarding degree of stenosis by established duplex criteria. Peak velocity suggests 50%-69% stenosis, with the ICA/ CCA ratio suggesting a lesser degree of stenosis. If  establishing a more accurate degree of stenosis is required, cerebral angiogram should be considered, or as a second best test, CTA. Left: Color duplex indicates minimal heterogeneous and calcified plaque, with no hemodynamically significant stenosis by duplex criteria in the extracranial cerebrovascular circulation. Signed, Dulcy Fanny. Dellia Nims, RPVI [facility] Radiology Electronically Signed   By: Corrie Mckusick D.O.   On: 03/12/2020 14:02   PERIPHERAL VASCULAR CATHETERIZATION  Result Date: 03/07/2020 See op note  ECHOCARDIOGRAM COMPLETE  Result Date: 03/12/2020    ECHOCARDIOGRAM REPORT   Patient Name:   Cassandra [name] Date of Exam: 03/12/2020 Medical Rec #:  [id number]   Height:       67.0 in Accession #:    [id number]  Weight:       145.3 lb Date of Birth:  [birth year]   BSA:          1.765 m Patient Age:    52 years    BP:           125/74 mmHg  Patient Gender: F           HR:           63 bpm. Exam Location:  [facility] Procedure: 2D Echo, Cardiac Doppler and Color Doppler Indications:     TIA 435.9  History:         Patient has no prior history of Echocardiogram examinations. No                  cardiac hsitory listed.  Sonographer:     Sherrie Sport RDCS (AE) Referring Phys:  [id number] [place] Diagnosing Phys: Kate Sable MD IMPRESSIONS  1. Left ventricular ejection fraction, by estimation, is 65 to 70%. The left ventricle has normal function. The left ventricle has no regional wall motion abnormalities. Left ventricular diastolic parameters were normal.  2. Right ventricular systolic function is normal. The right ventricular size is normal. There is normal pulmonary artery systolic pressure.  3. The mitral valve is normal in structure. No evidence of mitral valve regurgitation. No evidence of mitral stenosis.  4. The aortic valve is normal in structure. Aortic valve regurgitation is not visualized. No aortic stenosis is present.  5. The inferior vena  cava is normal in size with greater than 50% respiratory variability, suggesting right atrial pressure of 3 mmHg. FINDINGS  Left Ventricle: Left ventricular ejection fraction, by estimation, is 65 to 70%. The left ventricle has normal function. The left ventricle has no regional wall motion abnormalities. The left ventricular internal cavity size was normal in size. There is  no left ventricular hypertrophy. Left ventricular diastolic parameters were normal. Right Ventricle: The right ventricular size is normal. No increase in right ventricular wall thickness. Right ventricular systolic function is normal. There is normal pulmonary artery systolic pressure. The tricuspid regurgitant velocity is 2.57 m/s, and  with an assumed right atrial pressure of 3 mmHg, the estimated right ventricular systolic pressure is 06.2 mmHg. Left Atrium: Left atrial size was normal in size. Right Atrium: Right atrial size was normal in size. Pericardium: There is no evidence of pericardial effusion. Mitral Valve: The mitral valve is normal in structure. No evidence of mitral valve regurgitation. No evidence of mitral valve stenosis. Tricuspid Valve: The tricuspid valve is normal in structure. Tricuspid valve regurgitation is not demonstrated. No evidence of tricuspid stenosis. Aortic Valve: The aortic valve is normal in structure. Aortic valve regurgitation is not visualized. No aortic stenosis is present. Aortic valve mean gradient measures 4.5 mmHg. Aortic valve peak gradient measures 7.3 mmHg. Aortic valve area, by VTI measures 3.23 cm. Pulmonic Valve: The pulmonic valve was normal in structure. Pulmonic valve regurgitation is not visualized. No evidence of pulmonic stenosis. Aorta: The aortic root is normal in size and structure. Venous: The inferior vena cava is normal in size with greater than 50% respiratory variability, suggesting right atrial pressure of 3 mmHg. IAS/Shunts: No atrial level shunt detected by color flow Doppler.   LEFT VENTRICLE PLAX 2D LVIDd:         4.00 cm  Diastology LVIDs:         2.28 cm  LV e' medial:    7.72 cm/s LV PW:         1.50 cm  LV E/e' medial:  10.8 LV IVS:        0.90 cm  LV e' lateral:   11.50 cm/s LVOT diam:     2.00 cm  LV E/e' lateral: 7.3 LV SV:  96 LV SV Index:   54 LVOT Area:     3.14 cm  RIGHT VENTRICLE RV Basal diam:  3.77 cm RV S prime:     19.00 cm/s TAPSE (M-mode): 3.7 cm LEFT ATRIUM             Index       RIGHT ATRIUM           Index LA diam:        2.30 cm 1.30 cm/m  RA Area:     16.70 cm LA Vol (A2C):   45.2 ml 25.60 ml/m RA Volume:   45.40 ml  25.72 ml/m LA Vol (A4C):   35.1 ml 19.88 ml/m LA Biplane Vol: 39.1 ml 22.15 ml/m  AORTIC VALVE                   PULMONIC VALVE AV Area (Vmax):    3.22 cm    PV Vmax:        0.79 m/s AV Area (Vmean):   3.07 cm    PV Peak grad:   2.5 mmHg AV Area (VTI):     3.23 cm    RVOT Peak grad: 4 mmHg AV Vmax:           135.50 cm/s AV Vmean:          99.250 cm/s AV VTI:            0.297 m AV Peak Grad:      7.3 mmHg AV Mean Grad:      4.5 mmHg LVOT Vmax:         139.00 cm/s LVOT Vmean:        97.100 cm/s LVOT VTI:          0.305 m LVOT/AV VTI ratio: 1.03  AORTA Ao Root diam: 3.50 cm MITRAL VALVE               TRICUSPID VALVE MV Area (PHT): 2.39 cm    TR Peak grad:   26.4 mmHg MV Decel Time: 317 msec    TR Vmax:        257.00 cm/s MV E velocity: 83.40 cm/s MV A velocity: 72.30 cm/s  SHUNTS MV E/A ratio:  1.15        Systemic VTI:  0.30 m                            Systemic Diam: 2.00 cm Kate Sable MD Electronically signed by Kate Sable MD Signature Date/Time: 03/12/2020/4:01:51 PM    Final    ECHO TEE  Result Date: 03/21/2020    TRANSESOPHOGEAL ECHO REPORT   Patient Name:   Cassandra [name] Date of Exam: 03/21/2020 Medical Rec #:  [id number]   Height:       67.0 in Accession #:    [id number]  Weight:       138.0 lb Date of Birth:  October 19, 1955   BSA:          1.727 m Patient Age:    64 years    BP:           133/83 mmHg Patient Gender: F            HR:           83 bpm. Exam Location:  [facility] Procedure: Transesophageal Echo, Color Doppler, Cardiac Doppler and Saline            Contrast Bubble Study  Indications:     Not listed on order  History:         Patient has prior history of Echocardiogram examinations, most                  recent 03/12/2020.  Sonographer:     Sherrie Sport RDCS (AE) Referring Phys:  [place] Diagnosing Phys: Neoma Laming MD PROCEDURE: The transesophogeal probe was passed without difficulty through the esophogus of the patient. Local oropharyngeal anesthetic was provided with Benzocaine spray. Sedation performed by performing physician. The patient's vital signs; including heart rate, blood pressure, and oxygen saturation; remained stable throughout the procedure. The patient developed no complications during the procedure. IMPRESSIONS  1. Left ventricular ejection fraction, by estimation, is 60 to 65%. The left ventricle has normal function. The left ventricle has no regional wall motion abnormalities.  2. Right ventricular systolic function is normal. The right ventricular size is normal.  3. No left atrial/left atrial appendage thrombus was detected.  4. The mitral valve is normal in structure. Mild mitral valve regurgitation. No evidence of mitral stenosis.  5. The aortic valve is abnormal. Aortic valve regurgitation is mild. No aortic stenosis is present.  6. The inferior vena cava is normal in size with greater than 50% respiratory variability, suggesting right atrial pressure of 3 mmHg.  7. Agitated saline contrast bubble study was negative, with no evidence of any interatrial shunt. Conclusion(s)/Recommendation(s): No evidence of vegetation/infective endocarditis on this transesophageal echocardiogram. FINDINGS  Left Ventricle: Left ventricular ejection fraction, by estimation, is 60 to 65%. The left ventricle has normal function. The left ventricle has no regional wall motion abnormalities. The left ventricular  internal cavity size was normal in size. There is  no left ventricular hypertrophy. Right Ventricle: The right ventricular size is normal. No increase in right ventricular wall thickness. Right ventricular systolic function is normal. Left Atrium: Left atrial size was normal in size. No left atrial/left atrial appendage thrombus was detected. Right Atrium: Right atrial size was normal in size. Pericardium: There is no evidence of pericardial effusion. Mitral Valve: The mitral valve is normal in structure. Mild mitral valve regurgitation. No evidence of mitral valve stenosis. Tricuspid Valve: The tricuspid valve is normal in structure. Tricuspid valve regurgitation is mild . No evidence of tricuspid stenosis. Aortic Valve: The aortic valve is abnormal. Aortic valve regurgitation is mild. No aortic stenosis is present. Pulmonic Valve: The pulmonic valve was normal in structure. Pulmonic valve regurgitation is trivial. No evidence of pulmonic stenosis. Aorta: The aortic root is normal in size and structure. Venous: The inferior vena cava is normal in size with greater than 50% respiratory variability, suggesting right atrial pressure of 3 mmHg. IAS/Shunts: No atrial level shunt detected by color flow Doppler. Agitated saline contrast was given intravenously to evaluate for intracardiac shunting. Agitated saline contrast bubble study was negative, with no evidence of any interatrial shunt. There  is no evidence of a patent foramen ovale. No ventricular septal defect is seen or detected. There is no evidence of an atrial septal defect. Neoma Laming MD Electronically signed by Neoma Laming MD Signature Date/Time: 03/21/2020/8:39:59 AM    Final      ASSESSMENT & PLAN:  1. Primary pancreatic cancer ([place])   2. 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 Labs are reviewed and discussed with patient. Counts acceptable to proceed with cycle 5 FOLFIRINOX with Udenyca support on day 5 Repeat MRI MRCP for evaluation of treatment response. Cancer marker CA 19-9 has been trending down very well. #Weight loss, refer to nutritionist.  Continue nutrition supplementation. #Irinotecan-associated dysarthria, lip/tongue numbness Irinotecan to be infused over 180 minutes.  Atropine 0.5 mg once prior to the irinotecan. Her symptoms are mostly self-limiting.  Continue to monitor.  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 Discussed about other options including gabapentin.  Patient has an appointment with neurology next week and prefers to hold off gabapentin and see neurology first. First dose reduction oxaliplatin to 75mg /m  Supportive care measures are necessary for patient well-being and will be provided as necessary. We spent sufficient time to discuss many aspect of care, questions were answered to patient's satisfaction.   All questions were answered. The patient knows to call the clinic with any problems questions or concerns.  Return of visit: 3 weeks per patient's preference  [name], MD, PhD Hematology Oncology [facility] Pager- [phone] 06/03/2020

## 2020-06-04 LAB — CANCER ANTIGEN 19-9: CA 19-9: 88 U/mL — ABNORMAL HIGH (ref 0–35)

## 2020-06-05 ENCOUNTER — Inpatient Hospital Stay: Payer: BC Managed Care – PPO

## 2020-06-05 DIAGNOSIS — Z5111 Encounter for antineoplastic chemotherapy: Secondary | ICD-10-CM | POA: Diagnosis not present

## 2020-06-05 DIAGNOSIS — C259 Malignant neoplasm of pancreas, unspecified: Secondary | ICD-10-CM

## 2020-06-05 MED ORDER — HEPARIN SOD (PORK) LOCK FLUSH 100 UNIT/ML IV SOLN
500.0000 [IU] | Freq: Once | INTRAVENOUS | Status: AC | PRN
  Administered 2020-06-05: 500 [IU]
  Filled 2020-06-05: qty 5

## 2020-06-05 MED ORDER — SODIUM CHLORIDE 0.9% FLUSH
10.0000 mL | INTRAVENOUS | Status: DC | PRN
  Administered 2020-06-05: 10 mL
  Filled 2020-06-05: qty 10

## 2020-06-06 ENCOUNTER — Inpatient Hospital Stay: Payer: BC Managed Care – PPO

## 2020-06-06 DIAGNOSIS — C259 Malignant neoplasm of pancreas, unspecified: Secondary | ICD-10-CM

## 2020-06-06 DIAGNOSIS — Z5111 Encounter for antineoplastic chemotherapy: Secondary | ICD-10-CM | POA: Diagnosis not present

## 2020-06-06 MED ORDER — PEGFILGRASTIM-CBQV 6 MG/0.6ML ~~LOC~~ SOSY
6.0000 mg | PREFILLED_SYRINGE | Freq: Once | SUBCUTANEOUS | Status: AC
  Administered 2020-06-06: 6 mg via SUBCUTANEOUS
  Filled 2020-06-06: qty 0.6

## 2020-06-11 ENCOUNTER — Other Ambulatory Visit: Payer: Self-pay

## 2020-06-11 ENCOUNTER — Other Ambulatory Visit: Payer: Self-pay | Admitting: Oncology

## 2020-06-11 ENCOUNTER — Ambulatory Visit
Admission: RE | Admit: 2020-06-11 | Discharge: 2020-06-11 | Disposition: A | Discharge status: Home / Self Care | Payer: BC Managed Care – PPO | Source: Ambulatory Visit | Attending: Oncology | Admitting: Oncology

## 2020-06-11 DIAGNOSIS — C259 Malignant neoplasm of pancreas, unspecified: Secondary | ICD-10-CM

## 2020-06-11 IMAGING — MR MR ABDOMEN WO/W CM MRCP
19 of 20 series · 45 of 48 positions shown · IV contrast (gadavist)
Comparison: None.

CLINICAL DATA: Follow-up pancreatic carcinoma. Status post
chemotherapy.

EXAM:
MRI ABDOMEN WITHOUT AND WITH CONTRAST
TECHNIQUE: Multiplanar multisequence MR imaging of the abdomen was performed
both before and after the administration of intravenous contrast.
CONTRAST:  5mL GADAVIST GADOBUTROL 1 MMOL/ML IV SOLN

[Series 3: T2 · coronal · 6.0mm · 1.19mm/px · 2 of 26 slices shown (1 of 2)]
[im 1/26]
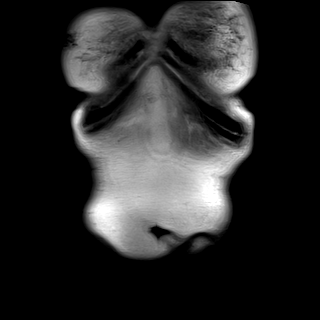
[im 26/26]
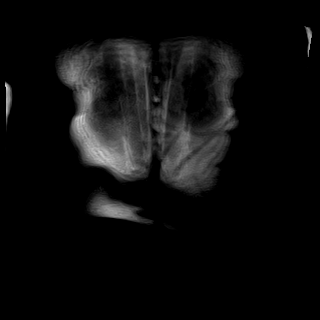

[Series 4: T2 · axial · 6.0mm · 1.19mm/px · z∈[-86,+116]mm · 2 of 29 slices shown (2 of 2)]
[im 1/29]
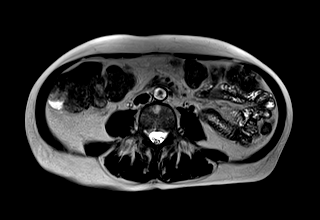
[im 29/29]
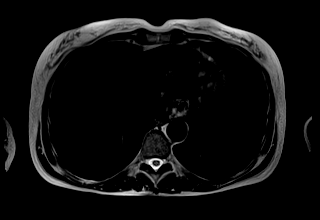

[Series 5: T1 · axial · 6.0mm · 0.74mm/px · z∈[-94,+108]mm · 2 of 29 slices shown (1 of 2)]
[im 1/29]
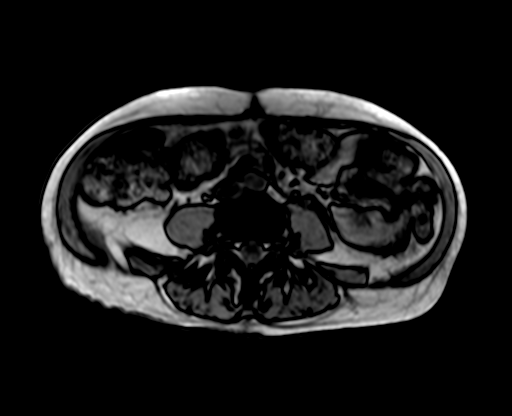
[im 29/29]
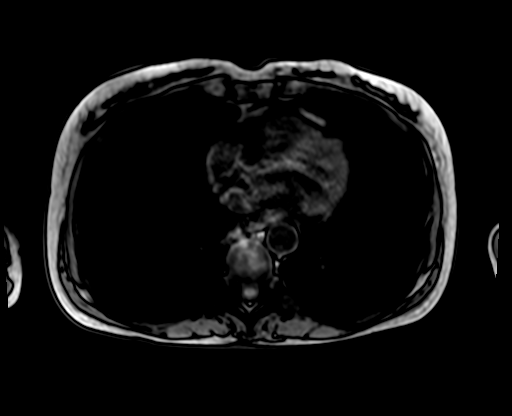

[Series 5: T1 · axial · 6.0mm · 0.74mm/px · 1 of 29 slices shown (2 of 2)]
[im 1/29]
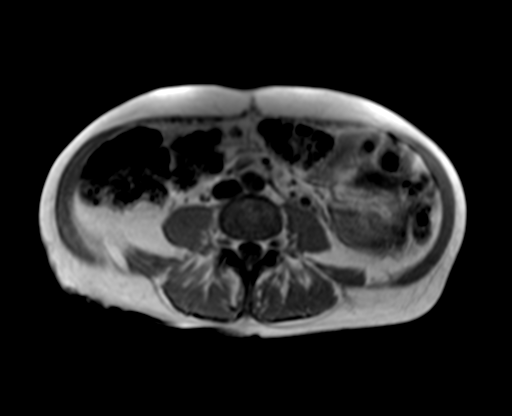

[Series 7: T2 fat-sat · axial · 6.0mm · 1.12mm/px · 1 of 30 slices shown]
[im 1/30]
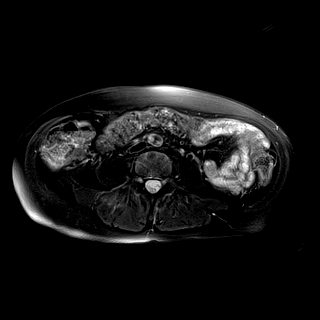

[Series 9: ax dwi_tracew · axial · 6.0mm · 1.42mm/px · z∈[-87,+122]mm · 4 of 90 slices shown]
[im 1/90]
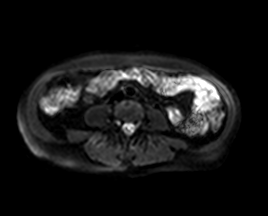
[im 30/90]
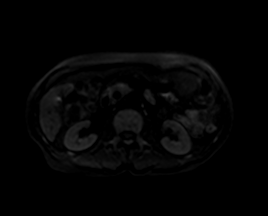
[im 60/90]
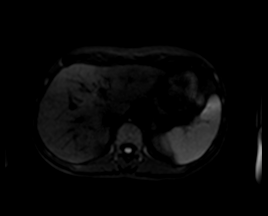
[im 90/90]
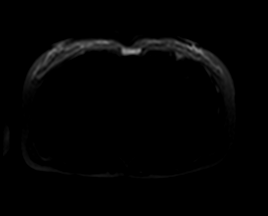

[Series 10: ax dwi_adc · axial · 6.0mm · 1.42mm/px · 1 of 30 slices shown]
[im 1/30]
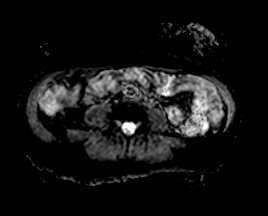

[Series 14: MRCP · coronal · 3.0mm · 1.12mm/px · 1 of 17 slices shown]
[im 1/17]
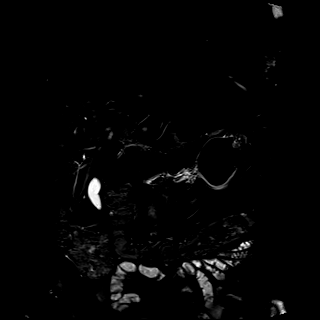

[Series 15: radials · coronal · 50.0mm · 0.78mm/px · 1 of 5 slices shown]
[im 1/5]
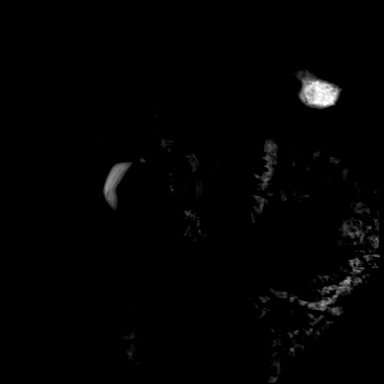

[Series 16: T1 dynamic fat-sat · axial · non-contrast · 3.0mm · 1.12mm/px · z∈[-101,+112]mm · 3 of 72 slices shown (1 of 5)]
[im 1/72]
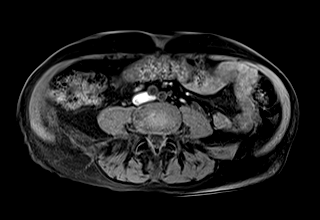
[im 36/72]
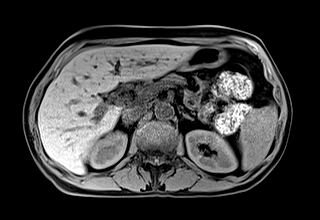
[im 72/72]
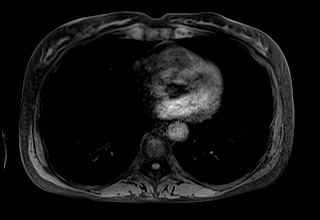

[Series 17: T1 dynamic fat-sat post-contrast · axial · 3.0mm · 1.12mm/px · z∈[-101,+112]mm · 3 of 72 slices shown (1 of 4)]
[im 1/72]
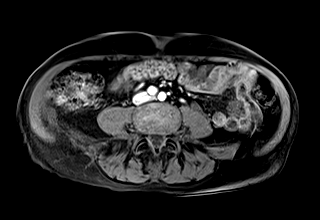
[im 36/72]
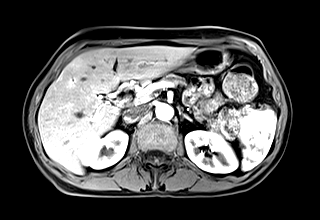
[im 72/72]
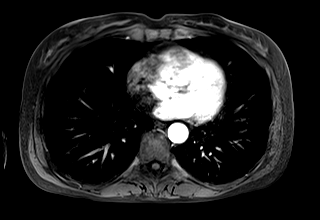

[Series 18: T1 dynamic fat-sat · axial · 3.0mm · 1.12mm/px · z∈[-101,+112]mm · 3 of 72 slices shown (2 of 5)]
[im 1/72]
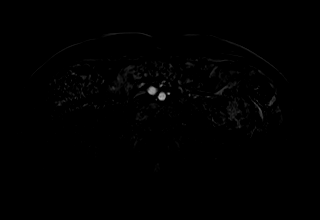
[im 36/72]
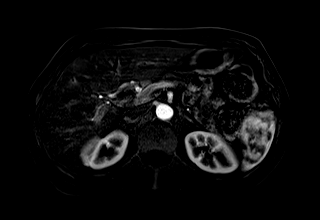
[im 72/72]
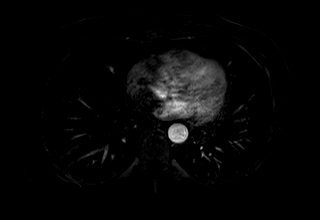

[Series 19: T1 dynamic fat-sat post-contrast · axial · 3.0mm · 1.12mm/px · z∈[-101,+112]mm · 3 of 72 slices shown (2 of 4)]
[im 1/72]
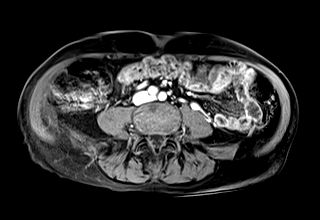
[im 36/72]
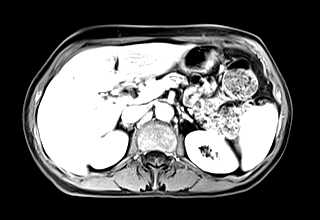
[im 72/72]
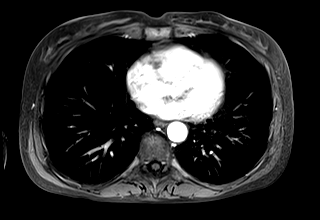

[Series 20: T1 dynamic fat-sat · axial · 3.0mm · 1.12mm/px · z∈[-101,+112]mm · 3 of 72 slices shown (3 of 5)]
[im 1/72]
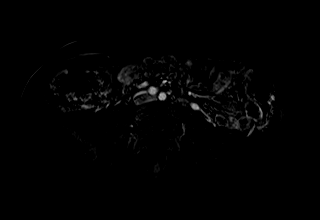
[im 36/72]
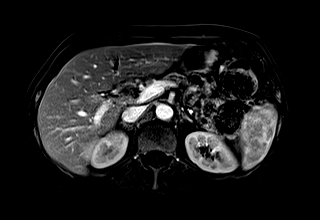
[im 72/72]
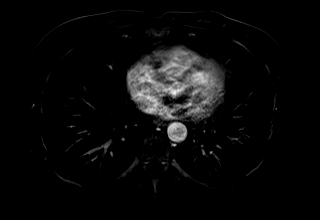

[Series 21: T1 dynamic fat-sat post-contrast · axial · 3.0mm · 1.12mm/px · z∈[-101,+112]mm · 3 of 72 slices shown (3 of 4)]
[im 1/72]
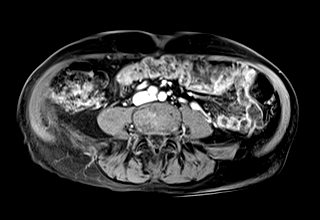
[im 36/72]
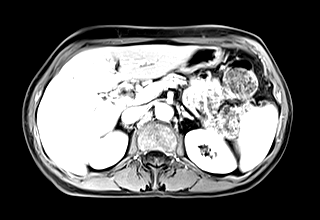
[im 72/72]
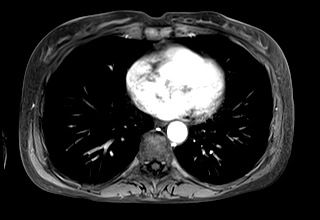

[Series 22: T1 dynamic fat-sat · axial · 3.0mm · 1.12mm/px · z∈[-101,+112]mm · 3 of 72 slices shown (4 of 5)]
[im 1/72]
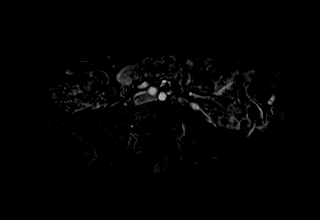
[im 36/72]
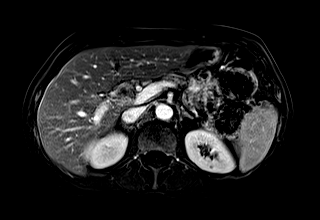
[im 72/72]
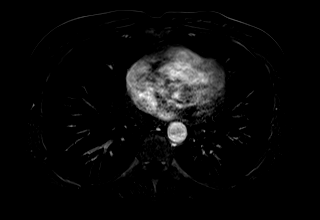

[Series 23: T1 dynamic post-contrast · coronal · 3.0mm · 1.31mm/px · 3 of 64 slices shown]
[im 1/64]
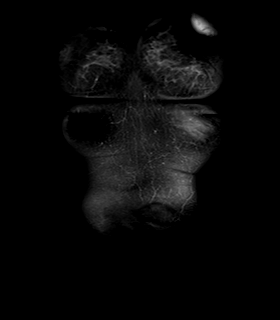
[im 32/64]
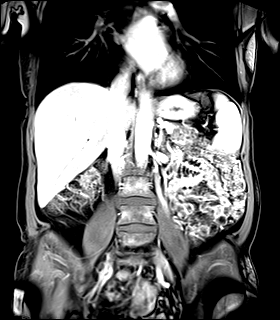
[im 64/64]
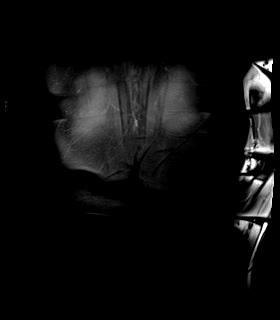

[Series 24: T1 dynamic fat-sat post-contrast · axial · 3.0mm · 1.12mm/px · z∈[-101,+112]mm · 3 of 72 slices shown (4 of 4)]
[im 1/72]
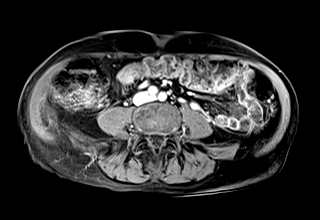
[im 36/72]
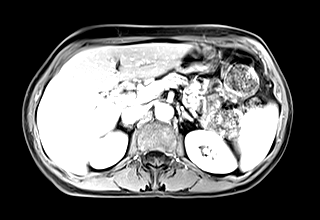
[im 72/72]
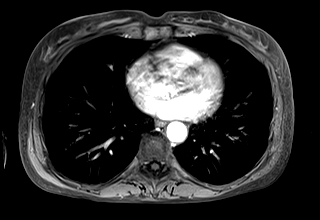

[Series 25: T1 dynamic fat-sat · axial · 3.0mm · 1.12mm/px · z∈[-101,+112]mm · 3 of 72 slices shown (5 of 5)]
[im 1/72]
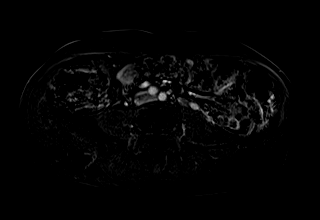
[im 36/72]
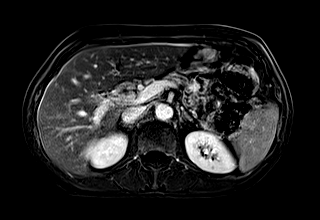
[im 72/72]
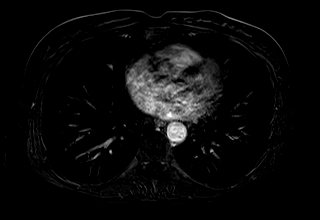

[45 of 48 positions shown; findings below may reference images not displayed]

FINDINGS: Lower chest: No acute findings.

Hepatobiliary: No hepatic masses identified. Gallbladder is
unremarkable. Stent is seen in the common bile duct and there is no
evidence of biliary obstruction.

Pancreas: Mild diffuse pancreatic atrophy and ductal dilatation is
seen. Mild soft tissue fullness is seen in the pancreatic head
surrounding the common bile duct stent, however there is no evidence
of discrete pancreatic mass. No evidence of peripancreatic
inflammatory changes or fluid collections.

Spleen:  Within normal limits in size and appearance.

Adrenals/Urinary Tract: No masses identified. No evidence of
hydronephrosis.

Stomach/Bowel: Visualized portion unremarkable.

Vascular/Lymphatic: No pathologically enlarged lymph nodes
identified. No abdominal aortic aneurysm.

Other:  None.

Musculoskeletal:  No suspicious bone lesions identified.
IMPRESSION: Mild soft tissue fullness in the pancreatic head surrounding the
common bile duct stent, without focal pancreatic mass.

No evidence of abdominal metastatic disease or other acute findings.

## 2020-06-11 MED ORDER — GADOBUTROL 1 MMOL/ML IV SOLN
5.0000 mL | Freq: Once | INTRAVENOUS | Status: AC | PRN
  Administered 2020-06-11: 5 mL via INTRAVENOUS

## 2020-06-23 ENCOUNTER — Inpatient Hospital Stay: Payer: BC Managed Care – PPO

## 2020-06-23 ENCOUNTER — Inpatient Hospital Stay (HOSPITAL_BASED_OUTPATIENT_CLINIC_OR_DEPARTMENT_OTHER): Payer: BC Managed Care – PPO | Admitting: Oncology

## 2020-06-23 ENCOUNTER — Encounter: Payer: Self-pay | Admitting: Oncology

## 2020-06-23 VITALS — BP 125/80 | HR 58 | Temp 98.7°F | Resp 18 | Wt 130.2 lb

## 2020-06-23 DIAGNOSIS — Z5111 Encounter for antineoplastic chemotherapy: Secondary | ICD-10-CM

## 2020-06-23 DIAGNOSIS — G620 Drug-induced polyneuropathy: Secondary | ICD-10-CM

## 2020-06-23 DIAGNOSIS — C259 Malignant neoplasm of pancreas, unspecified: Secondary | ICD-10-CM

## 2020-06-23 DIAGNOSIS — T451X5A Adverse effect of antineoplastic and immunosuppressive drugs, initial encounter: Secondary | ICD-10-CM

## 2020-06-23 LAB — COMPREHENSIVE METABOLIC PANEL
ALT: 65 U/L — ABNORMAL HIGH (ref 0–44)
AST: 48 U/L — ABNORMAL HIGH (ref 15–41)
Albumin: 3.9 g/dL (ref 3.5–5.0)
Alkaline Phosphatase: 257 U/L — ABNORMAL HIGH (ref 38–126)
Anion gap: 9 (ref 5–15)
BUN: 10 mg/dL (ref 8–23)
CO2: 25 mmol/L (ref 22–32)
Calcium: 8.8 mg/dL — ABNORMAL LOW (ref 8.9–10.3)
Chloride: 105 mmol/L (ref 98–111)
Creatinine, Ser: 0.49 mg/dL (ref 0.44–1.00)
GFR, Estimated: >60 mL/min (ref >60)
Glucose, Bld: 102 mg/dL — ABNORMAL HIGH (ref 70–99)
Potassium: 3.9 mmol/L (ref 3.5–5.1)
Sodium: 139 mmol/L (ref 135–145)
Total Bilirubin: 0.4 mg/dL (ref 0.3–1.2)
Total Protein: 6.3 g/dL — ABNORMAL LOW (ref 6.5–8.1)

## 2020-06-23 LAB — CBC WITH DIFFERENTIAL/PLATELET
Abs Immature Granulocytes: 0.02 10*3/uL (ref 0.00–0.07)
Basophils Absolute: 0 10*3/uL (ref 0.0–0.1)
Basophils Relative: 0 %
Eosinophils Absolute: 0.1 10*3/uL (ref 0.0–0.5)
Eosinophils Relative: 1 %
HCT: 32.4 % — ABNORMAL LOW (ref 36.0–46.0)
Hemoglobin: 11 g/dL — ABNORMAL LOW (ref 12.0–15.0)
Immature Granulocytes: 0 %
Lymphocytes Relative: 25 %
Lymphs Abs: 1.4 10*3/uL (ref 0.7–4.0)
MCH: 33.3 pg (ref 26.0–34.0)
MCHC: 34 g/dL (ref 30.0–36.0)
MCV: 98.2 fL (ref 80.0–100.0)
Monocytes Absolute: 0.6 10*3/uL (ref 0.1–1.0)
Monocytes Relative: 10 %
Neutro Abs: 3.6 10*3/uL (ref 1.7–7.7)
Neutrophils Relative %: 64 %
Platelets: 123 10*3/uL — ABNORMAL LOW (ref 150–400)
RBC: 3.3 MIL/uL — ABNORMAL LOW (ref 3.87–5.11)
RDW: 15.4 % (ref 11.5–15.5)
WBC: 5.8 10*3/uL (ref 4.0–10.5)
nRBC: 0 % (ref 0.0–0.2)

## 2020-06-23 MED ORDER — SODIUM CHLORIDE 0.9 % IV SOLN
10.0000 mg | Freq: Once | INTRAVENOUS | Status: AC
  Administered 2020-06-23: 10 mg via INTRAVENOUS
  Filled 2020-06-23: qty 10

## 2020-06-23 MED ORDER — DEXTROSE 5 % IV SOLN
Freq: Once | INTRAVENOUS | Status: AC
  Filled 2020-06-23: qty 250

## 2020-06-23 MED ORDER — SODIUM CHLORIDE 0.9 % IV SOLN
150.0000 mg | Freq: Once | INTRAVENOUS | Status: AC
  Administered 2020-06-23: 150 mg via INTRAVENOUS
  Filled 2020-06-23: qty 150

## 2020-06-23 MED ORDER — IRINOTECAN HCL CHEMO INJECTION 100 MG/5ML
150.0000 mg/m2 | Freq: Once | INTRAVENOUS | Status: AC
  Administered 2020-06-23: 260 mg via INTRAVENOUS
  Filled 2020-06-23: qty 10

## 2020-06-23 MED ORDER — SODIUM CHLORIDE 0.9 % IV SOLN
700.0000 mg | Freq: Once | INTRAVENOUS | Status: AC
Start: 2020-06-23 — End: 2020-06-23
  Administered 2020-06-23: 700 mg via INTRAVENOUS
  Filled 2020-06-23: qty 35

## 2020-06-23 MED ORDER — SODIUM CHLORIDE 0.9% FLUSH
10.0000 mL | Freq: Once | INTRAVENOUS | Status: AC
  Administered 2020-06-23: 10 mL via INTRAVENOUS
  Filled 2020-06-23: qty 10

## 2020-06-23 MED ORDER — OXALIPLATIN CHEMO INJECTION 100 MG/20ML
75.0000 mg/m2 | Freq: Once | INTRAVENOUS | Status: AC
  Administered 2020-06-23: 130 mg via INTRAVENOUS
  Filled 2020-06-23: qty 20

## 2020-06-23 MED ORDER — ATROPINE SULFATE 1 MG/ML IJ SOLN
0.5000 mg | Freq: Once | INTRAMUSCULAR | Status: AC
  Administered 2020-06-23: 0.5 mg via INTRAVENOUS
  Filled 2020-06-23: qty 1

## 2020-06-23 MED ORDER — DIPHENHYDRAMINE HCL 25 MG PO CAPS
50.0000 mg | ORAL_CAPSULE | Freq: Once | ORAL | Status: AC
  Administered 2020-06-23: 50 mg via ORAL
  Filled 2020-06-23: qty 2

## 2020-06-23 MED ORDER — DIPHENHYDRAMINE HCL 25 MG PO CAPS
25.0000 mg | ORAL_CAPSULE | Freq: Once | ORAL | Status: AC
  Administered 2020-06-23: 25 mg via ORAL
  Filled 2020-06-23: qty 1

## 2020-06-23 MED ORDER — PALONOSETRON HCL INJECTION 0.25 MG/5ML
0.2500 mg | Freq: Once | INTRAVENOUS | Status: AC
  Administered 2020-06-23: 0.25 mg via INTRAVENOUS
  Filled 2020-06-23: qty 5

## 2020-06-23 MED ORDER — SODIUM CHLORIDE 0.9 % IV SOLN
2400.0000 mg/m2 | INTRAVENOUS | Status: DC
  Administered 2020-06-23: 4200 mg via INTRAVENOUS
  Filled 2020-06-23: qty 84

## 2020-06-23 NOTE — Progress Notes (Signed)
Hematology/Oncology follow up note [facility] Telephone:([phone] [phone]   Patient Care Team: Flinchum, Kelby Aline, FNP as PCP - General (Family Medicine) Flinchum, Kelby Aline, FNP (Family Medicine) [name], RN as Oncology Nurse Navigator [name], MD as Consulting Physician (Oncology)  REFERRING PROVIDER: Sharmon Leyden*  CHIEF COMPLAINTS/REASON FOR VISIT:  Follow up for treatment of pancreatic adenocarcinoma  HISTORY OF PRESENTING ILLNESS:   Cassandra [name] is a  64 y.o.  female with PMH listed below was seen in consultation at the request of  Flinchum, Michelle S, F*  for evaluation of pancreatic adenocarcinoma Patient initially presented with jaundice, transaminitis, bilirubin was 9.9.  CA 19-9 was 1874.  Patient also reports unintentional weight loss. 02/08/2020 MRI abdomen and MRCP with and without contrast was done at [facility] which showed pancreatic head mass measuring up to 3 cm, with marked associated narrowing of the portal vein confluence.  SMA is preserved.  Marked intrahepatic and extrahepatic biliary duct dilatation as well as mild dilatation of the main pancreatic duct. Multiple hepatic masses highly concerning for metastatic disease.  Patient underwent EUS on 02/19/2020, which showed irregular mass identified in the pancreatic head, hypoechoic, measured 59mmx33mm, sonographic evidence concerning for invasion into the superior mesenteric artery.  There is no sign of significant abnormality in the main pancreatic duct.  Dilatation of common bile duct which measured up to 16 mm.  Region of celiac artery was visualized and showed no signs of significant abnormality.  No lymphadenopathy.  FNA showed adenocarcinoma.  02/19/2020, ERCP, malignant.  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  The stricture was treated with placement of wall flex metal stent.  Patient was  seen by [facility] oncology Dr. Pia Mau and was recommended for 3 drug regimen FOLFIRINOX.  Patient prefers to do chemotherapy locally at [facility].  Patient was referred to establish care today. She denies any pain.  Since stent placement, skin jaundice has improved.  Itchiness has also improved. Patient was accompanied by her husband today.  She has a family history of breast cancer in sister and paternal aunt, colon cancer paternal grandmother.  #no reportable targetable mutation on NGS 9/14/2021cycle 1 FOLFIRINOX.  Patient received oxaliplatin and about 50% of Irinotecan on day 1 and had experienced neurologic symptoms.  She went to ER and working diagnosis is TIA. # 03/19/2020-03/21/2020 patient was admitted due to sepsis with strep pneumonia bacteremia.  Patient was treated with IV Rocephin.  TEE was done which showed no vegetation.  No PFO or ASD.  Patient was discharged home and he finished full course of 14 days of IV Rocephin on 04/02/2020 per ID recommendation.  Repeat blood culture was also negative.    INTERVAL HISTORY [name] is a 64 y.o. female who has above history reviewed by me today presents for acute visit.   Problems and complaints are listed below Patient reports feeling well today Patient tolerates treatment.  She has no new complaints.  Denies any nausea vomiting diarrhea.Marland Kitchen Her weight is stable since last visit. Neuropathy symptoms are stable.  Not on any medication.  She follows up with neurology.  She has also had acupuncture.  Review of Systems  Constitutional: Negative for chills, fatigue, fever and unexpected weight change.  HENT:   Negative for hearing loss and voice change.   Eyes: Negative for eye problems.  Respiratory: Negative for chest tightness and cough.   Cardiovascular: Negative for  chest pain.  Gastrointestinal: Negative for abdominal distention, abdominal pain and blood in stool.  Endocrine: Negative for hot flashes.  Genitourinary: Negative for difficulty  urinating and frequency.   Musculoskeletal: Negative for arthralgias.  Skin: Negative for itching and rash.  Neurological: Positive for numbness. Negative for extremity weakness.  Hematological: Negative for adenopathy.  Psychiatric/Behavioral: Negative for confusion.    MEDICAL HISTORY:  Past Medical History:  Diagnosis Date  . Cancer [facility])    pancreatic cancer  . Colon polyps   . Family history of breast cancer   . Neutropenia ([place]) 04/18/2020  . Osteopenia after menopause 05/2017   femoral neck T score -2.0    SURGICAL HISTORY: Past Surgical History:  Procedure Laterality Date  . COLONOSCOPY  07/2015   WNL  . COLONOSCOPY  2008/2011  . OVARIAN CYST REMOVAL  1992   dermoid-Dr [place]  . PORTA CATH INSERTION N/A 03/07/2020   Procedure: PORTA CATH INSERTION;  Surgeon: Algernon Huxley, MD;  Location: [place] [facility];  Service: Cardiovascular;  Laterality: N/A;  . TEE WITHOUT CARDIOVERSION N/A 03/21/2020   Procedure: TRANSESOPHAGEAL ECHOCARDIOGRAM (TEE);  Surgeon: Dionisio David, MD;  Location: [facility] ORS;  Service: Cardiovascular;  Laterality: N/A;  . TUBAL LIGATION  1993    SOCIAL HISTORY: Social History   Socioeconomic History  . Marital status: Married    Spouse name: Not on file  . Number of children: 2  . Years of education: Not on file  . Highest education level: Not on file  Occupational History  . Occupation: Pharmacist, hospital    Comment: retired  . Occupation: Farmer  Tobacco Use  . Smoking status: Former Smoker    Packs/day: 0.50    Years: 10.00    Pack years: 5.00    Types: Cigarettes    Quit date: 1987    Years since quitting: 35.0  . Smokeless tobacco: Never Used  . Tobacco comment: Quit smoking 1987  Vaping Use  . Vaping Use: Never used  Substance and Sexual Activity  . Alcohol use: Not Currently    Comment: 0-2 mixed drinks a day  . Drug use: No  . Sexual activity: Not Currently    Birth control/protection: Post-menopausal  Other Topics Concern  . Not  on file  Social History Narrative  . Not on file   Social Determinants of Health   Financial Resource Strain: Not on file  Food Insecurity: Not on file  Transportation Needs: Not on file  Physical Activity: Not on file  Stress: Not on file  Social Connections: Not on file  Intimate Partner Violence: Not on file    FAMILY HISTORY: Family History  Problem Relation Age of Onset  . Breast cancer Paternal Aunt 77  . Diabetes Mother   . Osteoporosis Mother   . Hyperlipidemia Father   . Rheumatic fever Father   . Valvular heart disease Father   . Cancer Paternal Grandmother        possible colon  . Breast cancer Sister 67    ALLERGIES:  is allergic to penicillin g.  MEDICATIONS:  Current Outpatient Medications  Medication Sig Dispense Refill  . aspirin EC 81 MG tablet Take 1 tablet (81 mg total) by mouth daily. Swallow whole. 150 tablet 2  . atorvastatin (LIPITOR) 40 MG tablet Take 1 tablet (40 mg total) by mouth daily. 30 tablet 3  . Cholecalciferol 25 MCG (1000 UT) tablet Take 2,000 Units by mouth daily.    Marland Kitchen lidocaine-prilocaine (EMLA) cream Apply to  affected area once (Patient taking differently: Apply 1 application topically as directed.) 30 g 3  . magic mouthwash w/lidocaine SOLN Take 5 mLs by mouth 4 (four) times daily as needed for mouth pain. Sig: Swish & Spit 5-10 ml four times a day as needed. Dispense 480 ml. 1RF 480 mL 1  . simethicone (GAS-X) 80 MG chewable tablet Chew 1 tablet (80 mg total) by mouth every 8 (eight) hours as needed for flatulence. 60 tablet 0  . loperamide (IMODIUM A-D) 2 MG tablet Take 2 at onset of diarrhea, then 1 every 2hrs until 12hr without a BM. May take 2 tab every 4hrs at bedtime. If diarrhea recurs repeat. (Patient not taking: No sig reported) 100 tablet 1  . ondansetron (ZOFRAN) 8 MG tablet Take 1 tablet (8 mg total) by mouth 2 (two) times daily as needed. Start on day 3 after chemotherapy. (Patient not taking: Reported on 06/23/2020) 30  tablet 1  . prochlorperazine (COMPAZINE) 10 MG tablet Take 1 tablet (10 mg total) by mouth every 6 (six) hours as needed (Nausea or vomiting). (Patient not taking: Reported on 06/23/2020) 30 tablet 1   No current facility-administered medications for this visit.   Facility-Administered Medications Ordered in Other Visits  Medication Dose Route Frequency Provider Last Rate Last Admin  . fluorouracil (ADRUCIL) 4,200 mg in sodium chloride 0.9 % 66 mL chemo infusion  2,400 mg/m2 (Treatment Plan Recorded) Intravenous 1 day or 1 dose [name], MD      . irinotecan (CAMPTOSAR) 260 mg in sodium chloride 0.9 % 500 mL chemo infusion  150 mg/m2 (Treatment Plan Recorded) Intravenous Once [name], MD 171 mL/hr at 06/23/20 1343 260 mg at 06/23/20 1343     PHYSICAL EXAMINATION: ECOG PERFORMANCE STATUS: 1 - Symptomatic but completely ambulatory Vitals:   06/23/20 0918  BP: 125/80  Pulse: (!) 58  Resp: 18  Temp: 98.7 F (37.1 C)   Filed Weights   06/23/20 0918  Weight: 130 lb 3.2 oz (59.1 kg)    Physical Exam Constitutional:      General: She is not in acute distress. HENT:     Head: Normocephalic and atraumatic.  Eyes:     General: No scleral icterus. Cardiovascular:     Rate and Rhythm: Normal rate and regular rhythm.     Heart sounds: Normal heart sounds.  Pulmonary:     Effort: Pulmonary effort is normal. No respiratory distress.     Breath sounds: No wheezing.  Abdominal:     General: Bowel sounds are normal. There is no distension.     Palpations: Abdomen is soft.  Musculoskeletal:        General: No deformity. Normal range of motion.     Cervical back: Normal range of motion and neck supple.  Skin:    General: Skin is warm and dry.     Coloration: Skin is not jaundiced.     Findings: No erythema or rash.  Neurological:     Mental Status: She is alert and oriented to person, place, and time. Mental status is at baseline.     Cranial Nerves: No cranial nerve deficit.      Coordination: Coordination normal.  Psychiatric:        Mood and Affect: Mood normal.     LABORATORY DATA:  I have reviewed the data as listed Lab Results  Component Value Date   WBC 5.8 06/23/2020   HGB 11.0 (L) 06/23/2020   HCT 32.4 (L) 06/23/2020  MCV 98.2 06/23/2020   PLT 123 (L) 06/23/2020   Recent Labs    02/06/20 1049 02/29/20 1314 03/19/20 1024 03/20/20 0411 03/21/20 0318 04/04/20 0827 05/09/20 0809 06/03/20 0804 06/23/20 0830  NA 137   < > 129* 136 140   < > 142 138 139  K 4.1   < > 3.8 4.0 3.7   < > 3.4* 3.8 3.9  CL 99   < > 95* 104 108   < > 107 102 105  CO2 23   < > 25 25 25    < > 26 26 25   GLUCOSE 113*   < > 145* 126* 114*   < > 147* 114* 102*  BUN 11   < > 11 7* 6*   < > 8 18 10   CREATININE 0.80   < > 0.85 0.66 0.52   < > 0.84 0.64 0.49  CALCIUM 10.2   < > 8.9 8.3* 8.2*   < > 8.8* 9.0 8.8*  GFRNONAA 78   < > >60 >60 >60   < > >60 >60 >60  GFRAA 90   < > >60 >60 >60  --   --   --   --   PROT 7.0   < > 7.1  --  5.3*   < > 6.1* 6.3* 6.3*  ALBUMIN 4.9*   < > 3.5  --  2.5*   < > 3.6 3.9 3.9  AST 195*   < > 30  --  52*   < > 35 38 48*  ALT 366*   < > 43  --  74*   < > 38 43 65*  ALKPHOS 934*   < > 125  --  112   < > 169* 133* 257*  BILITOT 9.9*   < > 1.5*  --  1.0   < > 0.4 0.6 0.4  BILIDIR 8.11*  --   --   --   --   --   --   --   --   IBILI 1.79*  --   --   --   --   --   --   --   --    < > = values in this interval not displayed.   Iron/TIBC/Ferritin/ %Sat No results found for: IRON, TIBC, FERRITIN, IRONPCTSAT    RADIOGRAPHIC STUDIES: I have personally reviewed the radiological images as listed and agreed with the findings in the report. Reviewed findings of MRI abdomen MRCP done at [facility]. MR 3D Recon At Scanner  Result Date: 06/11/2020 CLINICAL DATA:  Follow-up pancreatic carcinoma. Status post chemotherapy. EXAM: MRI ABDOMEN WITHOUT AND WITH CONTRAST TECHNIQUE: Multiplanar multisequence MR imaging of the abdomen was performed both before and  after the administration of intravenous contrast. CONTRAST:  65mL GADAVIST GADOBUTROL 1 MMOL/ML IV SOLN COMPARISON:  None. FINDINGS: Lower chest: No acute findings. Hepatobiliary: No hepatic masses identified. Gallbladder is unremarkable. Stent is seen in the common bile duct and there is no evidence of biliary obstruction. Pancreas: Mild diffuse pancreatic atrophy and ductal dilatation is seen. Mild soft tissue fullness is seen in the pancreatic head surrounding the common bile duct stent, however there is no evidence of discrete pancreatic mass. No evidence of peripancreatic inflammatory changes or fluid collections. Spleen:  Within normal limits in size and appearance. Adrenals/Urinary Tract: No masses identified. No evidence of hydronephrosis. Stomach/Bowel: Visualized portion unremarkable. Vascular/Lymphatic: No pathologically enlarged lymph nodes identified. No abdominal aortic aneurysm. Other:  None. Musculoskeletal:  No suspicious bone lesions identified. IMPRESSION: Mild soft tissue fullness in the pancreatic head surrounding the common bile duct stent, without focal pancreatic mass. No evidence of abdominal metastatic disease or other acute findings. Electronically Signed   By: Marlaine Hind M.D.   On: 06/11/2020 14:56   MR ABDOMEN MRCP W WO CONTAST  Result Date: 06/11/2020 CLINICAL DATA:  Follow-up pancreatic carcinoma. Status post chemotherapy. EXAM: MRI ABDOMEN WITHOUT AND WITH CONTRAST TECHNIQUE: Multiplanar multisequence MR imaging of the abdomen was performed both before and after the administration of intravenous contrast. CONTRAST:  57mL GADAVIST GADOBUTROL 1 MMOL/ML IV SOLN COMPARISON:  None. FINDINGS: Lower chest: No acute findings. Hepatobiliary: No hepatic masses identified. Gallbladder is unremarkable. Stent is seen in the common bile duct and there is no evidence of biliary obstruction. Pancreas: Mild diffuse pancreatic atrophy and ductal dilatation is seen. Mild soft tissue fullness is  seen in the pancreatic head surrounding the common bile duct stent, however there is no evidence of discrete pancreatic mass. No evidence of peripancreatic inflammatory changes or fluid collections. Spleen:  Within normal limits in size and appearance. Adrenals/Urinary Tract: No masses identified. No evidence of hydronephrosis. Stomach/Bowel: Visualized portion unremarkable. Vascular/Lymphatic: No pathologically enlarged lymph nodes identified. No abdominal aortic aneurysm. Other:  None. Musculoskeletal:  No suspicious bone lesions identified. IMPRESSION: Mild soft tissue fullness in the pancreatic head surrounding the common bile duct stent, without focal pancreatic mass. No evidence of abdominal metastatic disease or other acute findings. Electronically Signed   By: Marlaine Hind M.D.   On: 06/11/2020 14:56     ASSESSMENT & PLAN:  1. Primary pancreatic cancer ([place])   2. 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 Currently on palliative chemotherapy with FOLFIRINOX with Udenyca support. Interval MRI abdomen images were independently reviewed by me.  Images showed excellent response with complete resolution of liver mets, no discrete pancreatic mass with only soft tissue fullness in the pancreatic head. CA 19-9 has also decreased to 88. Image findings were discussed with patient and her husband. Recommend to continue the same chemotherapy regimen, monitor neuropathy symptoms.  Plan is to finish 12 cycles of FOLFIRINOX and then switch to FOLFIRI for maintenance. If her neuropathy gets worse, may consider dropping oxaliplatin earlier. We discussed that since she has stage IV pancreatic cancer to start with,  even though she has now had an excellent response to the chemotherapy, I do not think she is going to benefit from definitive surgery treatments. Patient is encouraged to make a follow-up appointment with her [facility] oncologist for further discussion.  #Grade 1 transaminitis, possible secondary to chemotherapy or recent alcohol use.  Monitor. #Irinotecan-associated dysarthria, lip/tongue numbness Irinotecan to be infused over 180 minutes.  Atropine 0.5 mg once prior to the irinotecan. This seems to help with her symptoms during the last 3 months.  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 First dose reduction oxaliplatin to 75mg /m  Supportive care measures are necessary for patient well-being and will be provided as necessary. We spent sufficient time to discuss many aspect of care, questions were answered to patient's satisfaction.   All questions were answered. The patient knows to call the clinic with any problems questions or concerns.  Return  of visit: 2 weeks per patient's preference  [name], MD, PhD Hematology Oncology [facility] at [facility] Pager- [phone] 06/23/2020

## 2020-06-24 LAB — CANCER ANTIGEN 19-9: CA 19-9: 38 U/mL — ABNORMAL HIGH (ref 0–35)

## 2020-06-25 ENCOUNTER — Inpatient Hospital Stay: Payer: BC Managed Care – PPO

## 2020-06-25 ENCOUNTER — Other Ambulatory Visit: Payer: Self-pay

## 2020-06-25 DIAGNOSIS — Z5111 Encounter for antineoplastic chemotherapy: Secondary | ICD-10-CM | POA: Diagnosis not present

## 2020-06-25 DIAGNOSIS — C259 Malignant neoplasm of pancreas, unspecified: Secondary | ICD-10-CM

## 2020-06-25 MED ORDER — HEPARIN SOD (PORK) LOCK FLUSH 100 UNIT/ML IV SOLN
500.0000 [IU] | Freq: Once | INTRAVENOUS | Status: AC | PRN
  Administered 2020-06-25: 500 [IU]
  Filled 2020-06-25: qty 5

## 2020-06-25 MED ORDER — SODIUM CHLORIDE 0.9% FLUSH
10.0000 mL | INTRAVENOUS | Status: DC | PRN
  Administered 2020-06-25: 10 mL
  Filled 2020-06-25: qty 10

## 2020-06-25 MED ORDER — HEPARIN SOD (PORK) LOCK FLUSH 100 UNIT/ML IV SOLN
INTRAVENOUS | Status: AC
  Filled 2020-06-25: qty 5

## 2020-06-26 ENCOUNTER — Other Ambulatory Visit: Payer: Self-pay

## 2020-06-26 ENCOUNTER — Inpatient Hospital Stay: Payer: BC Managed Care – PPO

## 2020-06-26 DIAGNOSIS — C259 Malignant neoplasm of pancreas, unspecified: Secondary | ICD-10-CM

## 2020-06-26 DIAGNOSIS — Z5111 Encounter for antineoplastic chemotherapy: Secondary | ICD-10-CM | POA: Diagnosis not present

## 2020-06-26 MED ORDER — PEGFILGRASTIM-CBQV 6 MG/0.6ML ~~LOC~~ SOSY
6.0000 mg | PREFILLED_SYRINGE | Freq: Once | SUBCUTANEOUS | Status: AC
Start: 2020-06-26 — End: 2020-06-26
  Administered 2020-06-26: 6 mg via SUBCUTANEOUS
  Filled 2020-06-26: qty 0.6

## 2020-07-01 ENCOUNTER — Telehealth: Payer: Self-pay | Admitting: Adult Health

## 2020-07-01 ENCOUNTER — Telehealth: Payer: Self-pay | Admitting: *Deleted

## 2020-07-01 ENCOUNTER — Telehealth: Payer: Self-pay

## 2020-07-01 NOTE — Telephone Encounter (Signed)
Yes recommend testing and update me. keep appt for now unless she is positive.

## 2020-07-01 NOTE — Telephone Encounter (Signed)
Copied from CRM [id number]. Topic: General - Other >> Jul 01, 2020  4:06 PM Gaetana Michaelis A wrote: Reason for CRM: Patient would like to be tested, at the recommendation of their oncologist, for COVID-19.  Patient would like to be tested before chemotherapy on Monday 07/09/20

## 2020-07-01 NOTE — Telephone Encounter (Signed)
Patient will call to see if she can get tested and let us know results

## 2020-07-01 NOTE — Telephone Encounter (Signed)
Patient called reporting that she has been exposed to COVID this past Saturday but she is feeling fine. She states she has an appointment for infusion  Monday and is asking if she can come to appointment or if she needs to be tested for COVID first. Please advise

## 2020-07-02 NOTE — Telephone Encounter (Signed)
Noted ok to place test order for drive up testing at our office.

## 2020-07-03 NOTE — Telephone Encounter (Signed)
Patient states that she was tested for covid on 07/03/19. KW

## 2020-07-07 ENCOUNTER — Encounter: Payer: Self-pay | Admitting: Oncology

## 2020-07-07 ENCOUNTER — Inpatient Hospital Stay: Payer: BC Managed Care – PPO | Attending: Oncology

## 2020-07-07 ENCOUNTER — Inpatient Hospital Stay: Payer: BC Managed Care – PPO

## 2020-07-07 ENCOUNTER — Other Ambulatory Visit: Payer: Self-pay

## 2020-07-07 ENCOUNTER — Inpatient Hospital Stay (HOSPITAL_BASED_OUTPATIENT_CLINIC_OR_DEPARTMENT_OTHER): Payer: BC Managed Care – PPO | Admitting: Oncology

## 2020-07-07 VITALS — BP 132/82 | HR 58 | Temp 98.4°F | Resp 18 | Wt 128.3 lb

## 2020-07-07 DIAGNOSIS — C259 Malignant neoplasm of pancreas, unspecified: Secondary | ICD-10-CM | POA: Diagnosis not present

## 2020-07-07 DIAGNOSIS — Z5111 Encounter for antineoplastic chemotherapy: Secondary | ICD-10-CM

## 2020-07-07 DIAGNOSIS — C787 Secondary malignant neoplasm of liver and intrahepatic bile duct: Secondary | ICD-10-CM | POA: Diagnosis present

## 2020-07-07 DIAGNOSIS — Z5189 Encounter for other specified aftercare: Secondary | ICD-10-CM | POA: Insufficient documentation

## 2020-07-07 DIAGNOSIS — D6481 Anemia due to antineoplastic chemotherapy: Secondary | ICD-10-CM | POA: Diagnosis not present

## 2020-07-07 DIAGNOSIS — C250 Malignant neoplasm of head of pancreas: Secondary | ICD-10-CM | POA: Diagnosis present

## 2020-07-07 DIAGNOSIS — T451X5A Adverse effect of antineoplastic and immunosuppressive drugs, initial encounter: Secondary | ICD-10-CM

## 2020-07-07 DIAGNOSIS — E876 Hypokalemia: Secondary | ICD-10-CM

## 2020-07-07 DIAGNOSIS — D696 Thrombocytopenia, unspecified: Secondary | ICD-10-CM

## 2020-07-07 LAB — CBC WITH DIFFERENTIAL/PLATELET
Abs Immature Granulocytes: 0.11 10*3/uL — ABNORMAL HIGH (ref 0.00–0.07)
Basophils Absolute: 0 10*3/uL (ref 0.0–0.1)
Basophils Relative: 0 %
Eosinophils Absolute: 0.1 10*3/uL (ref 0.0–0.5)
Eosinophils Relative: 1 %
HCT: 30.6 % — ABNORMAL LOW (ref 36.0–46.0)
Hemoglobin: 10.8 g/dL — ABNORMAL LOW (ref 12.0–15.0)
Immature Granulocytes: 1 %
Lymphocytes Relative: 18 %
Lymphs Abs: 1.4 10*3/uL (ref 0.7–4.0)
MCH: 34 pg (ref 26.0–34.0)
MCHC: 35.3 g/dL (ref 30.0–36.0)
MCV: 96.2 fL (ref 80.0–100.0)
Monocytes Absolute: 0.7 10*3/uL (ref 0.1–1.0)
Monocytes Relative: 9 %
Neutro Abs: 5.7 10*3/uL (ref 1.7–7.7)
Neutrophils Relative %: 71 %
Platelets: 126 10*3/uL — ABNORMAL LOW (ref 150–400)
RBC: 3.18 MIL/uL — ABNORMAL LOW (ref 3.87–5.11)
RDW: 14.9 % (ref 11.5–15.5)
WBC: 8 10*3/uL (ref 4.0–10.5)
nRBC: 0 % (ref 0.0–0.2)

## 2020-07-07 LAB — COMPREHENSIVE METABOLIC PANEL
ALT: 40 U/L (ref 0–44)
AST: 44 U/L — ABNORMAL HIGH (ref 15–41)
Albumin: 3.9 g/dL (ref 3.5–5.0)
Alkaline Phosphatase: 191 U/L — ABNORMAL HIGH (ref 38–126)
Anion gap: 7 (ref 5–15)
BUN: 12 mg/dL (ref 8–23)
CO2: 25 mmol/L (ref 22–32)
Calcium: 8.5 mg/dL — ABNORMAL LOW (ref 8.9–10.3)
Chloride: 108 mmol/L (ref 98–111)
Creatinine, Ser: 0.58 mg/dL (ref 0.44–1.00)
GFR, Estimated: >60 mL/min (ref >60)
Glucose, Bld: 132 mg/dL — ABNORMAL HIGH (ref 70–99)
Potassium: 3.1 mmol/L — ABNORMAL LOW (ref 3.5–5.1)
Sodium: 140 mmol/L (ref 135–145)
Total Bilirubin: 0.5 mg/dL (ref 0.3–1.2)
Total Protein: 6.1 g/dL — ABNORMAL LOW (ref 6.5–8.1)

## 2020-07-07 MED ORDER — SODIUM CHLORIDE 0.9 % IV SOLN
10.0000 mg | Freq: Once | INTRAVENOUS | Status: AC
  Administered 2020-07-07: 10 mg via INTRAVENOUS
  Filled 2020-07-07: qty 10

## 2020-07-07 MED ORDER — SODIUM CHLORIDE 0.9 % IV SOLN
400.0000 mg/m2 | Freq: Once | INTRAVENOUS | Status: AC
  Administered 2020-07-07: 668 mg via INTRAVENOUS
  Filled 2020-07-07: qty 33.4

## 2020-07-07 MED ORDER — SODIUM CHLORIDE 0.9 % IV SOLN
2400.0000 mg/m2 | INTRAVENOUS | Status: AC
  Administered 2020-07-07: 4000 mg via INTRAVENOUS
  Filled 2020-07-07: qty 80

## 2020-07-07 MED ORDER — DIPHENHYDRAMINE HCL 25 MG PO CAPS
25.0000 mg | ORAL_CAPSULE | Freq: Once | ORAL | Status: AC
  Administered 2020-07-07: 25 mg via ORAL
  Filled 2020-07-07: qty 1

## 2020-07-07 MED ORDER — DEXTROSE 5 % IV SOLN
Freq: Once | INTRAVENOUS | Status: AC
  Filled 2020-07-07: qty 250

## 2020-07-07 MED ORDER — SODIUM CHLORIDE 0.9 % IV SOLN
150.0000 mg | Freq: Once | INTRAVENOUS | Status: AC
  Administered 2020-07-07: 150 mg via INTRAVENOUS
  Filled 2020-07-07: qty 150

## 2020-07-07 MED ORDER — OXALIPLATIN CHEMO INJECTION 100 MG/20ML
75.0000 mg/m2 | Freq: Once | INTRAVENOUS | Status: AC
  Administered 2020-07-07: 125 mg via INTRAVENOUS
  Filled 2020-07-07: qty 20

## 2020-07-07 MED ORDER — ATROPINE SULFATE 1 MG/ML IJ SOLN
0.5000 mg | Freq: Once | INTRAMUSCULAR | Status: AC
  Administered 2020-07-07: 0.5 mg via INTRAVENOUS
  Filled 2020-07-07: qty 1

## 2020-07-07 MED ORDER — PALONOSETRON HCL INJECTION 0.25 MG/5ML
0.2500 mg | Freq: Once | INTRAVENOUS | Status: AC
  Administered 2020-07-07: 0.25 mg via INTRAVENOUS
  Filled 2020-07-07: qty 5

## 2020-07-07 MED ORDER — SODIUM CHLORIDE 0.9% FLUSH
10.0000 mL | Freq: Once | INTRAVENOUS | Status: AC
  Administered 2020-07-07: 10 mL via INTRAVENOUS
  Filled 2020-07-07: qty 10

## 2020-07-07 MED ORDER — SODIUM CHLORIDE 0.9 % IV SOLN
150.0000 mg/m2 | Freq: Once | INTRAVENOUS | Status: AC
  Administered 2020-07-07: 260 mg via INTRAVENOUS
  Filled 2020-07-07: qty 4

## 2020-07-07 MED ORDER — POTASSIUM CHLORIDE CRYS ER 20 MEQ PO TBCR: 20.0000 meq | EXTENDED_RELEASE_TABLET | Freq: Every day | ORAL | 0 refills | Status: DC

## 2020-07-07 MED ORDER — DIPHENHYDRAMINE HCL 25 MG PO CAPS
50.0000 mg | ORAL_CAPSULE | Freq: Once | ORAL | Status: AC
  Administered 2020-07-07: 50 mg via ORAL
  Filled 2020-07-07: qty 2

## 2020-07-07 MED ORDER — DIPHENHYDRAMINE HCL 25 MG PO CAPS: 25.0000 mg | ORAL_CAPSULE | Freq: Once | ORAL | Status: DC

## 2020-07-07 NOTE — Progress Notes (Signed)
Pt here for follow up. No new concerns voiced.   

## 2020-07-07 NOTE — Progress Notes (Signed)
Hematology/Oncology follow up note [facility] [phone] [phone]   Patient Care Team: [name], Cassandra Aline, FNP as PCP - General (Family Medicine) [name], Cassandra Aline, FNP (Family Medicine) Cassandra Jacks, RN as Oncology Nurse Navigator Cassandra Server, MD as Consulting Physician (Oncology)  REFERRING PROVIDER: Sharmon [name]*  CHIEF COMPLAINTS/REASON FOR VISIT:  Follow up for treatment of pancreatic adenocarcinoma  HISTORY OF PRESENTING ILLNESS:   Cassandra [name] is a  65 y.o.  female with PMH listed below was seen in consultation at the request of  [name], Cassandra S, F*  for evaluation of pancreatic adenocarcinoma Patient initially presented with jaundice, transaminitis, bilirubin was 9.9.  CA 19-9 was 1874.  Patient also reports unintentional weight loss. 02/08/2020 MRI abdomen and MRCP with and without contrast was done at [facility] which showed pancreatic head mass measuring up to 3 cm, with marked associated narrowing of the portal vein confluence.  SMA is preserved.  Marked intrahepatic and extrahepatic biliary duct dilatation as well as mild dilatation of the main pancreatic duct. Multiple hepatic masses highly concerning for metastatic disease.  Patient underwent EUS on 02/19/2020, which showed irregular mass identified in the pancreatic head, hypoechoic, measured 35mmx33mm, sonographic evidence concerning for invasion into the superior mesenteric artery.  There is no sign of significant abnormality in the main pancreatic duct.  Dilatation of common bile duct which measured up to 16 mm.  Region of celiac artery was visualized and showed no signs of significant abnormality.  No lymphadenopathy.  FNA showed adenocarcinoma.  02/19/2020, ERCP, malignant.  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  The stricture was treated with placement of wall flex metal stent.  Patient was  seen by [facility] oncology Cassandra. Pia [name] and was recommended for 3 drug regimen FOLFIRINOX.  Patient prefers to do chemotherapy locally at [facility].  Patient was referred to establish care today. She denies any pain.  Since stent placement, skin jaundice has improved.  Itchiness has also improved. Patient was accompanied by her husband today.  She has a family history of breast cancer in sister and paternal aunt, colon cancer paternal grandmother.  #no reportable targetable mutation on NGS 9/14/2021cycle 1 FOLFIRINOX.  Patient received oxaliplatin and about 50% of Irinotecan on day 1 and had experienced neurologic symptoms.  She went to ER and working diagnosis is TIA. # 03/19/2020-03/21/2020 patient was admitted due to sepsis with strep pneumonia bacteremia.  Patient was treated with IV Rocephin.  TEE was done which showed no vegetation.  No PFO or ASD.  Patient was discharged home and he finished full course of 14 days of IV Rocephin on 04/02/2020 per ID recommendation.  Repeat blood culture was also negative.    INTERVAL HISTORY Cassandra [name] is a 65 y.o. female who has above history reviewed by me today presents for acute visit.   Problems and complaints are listed below Patient reports feeling well today Patient is slightly more tired, overall doing okay today.  No new complaints. Neuropathy, stable.   Review of Systems  Constitutional: Negative for chills, fatigue, fever and unexpected weight change.  HENT:   Negative for hearing loss and voice change.   Eyes: Negative for eye problems.  Respiratory: Negative for chest tightness and cough.   Cardiovascular: Negative for chest pain.  Gastrointestinal: Negative for abdominal distention, abdominal pain and blood in stool.  Endocrine: Negative for hot flashes.  Genitourinary: Negative for difficulty urinating and  frequency.   Musculoskeletal: Negative for arthralgias.  Skin: Negative for itching and rash.  Neurological: Positive for numbness. Negative  for extremity weakness.  Hematological: Negative for adenopathy.  Psychiatric/Behavioral: Negative for confusion.    MEDICAL HISTORY:  Past Medical History:  Diagnosis Date  . Cancer [facility])    pancreatic cancer  . Colon polyps   . Family history of breast cancer   . Neutropenia ([place]) 04/18/2020  . Osteopenia after menopause 05/2017   femoral neck T score -2.0    SURGICAL HISTORY: Past Surgical History:  Procedure Laterality Date  . COLONOSCOPY  07/2015   WNL  . COLONOSCOPY  2008/2011  . OVARIAN CYST REMOVAL  1992   dermoid-Cassandra [name]  . PORTA CATH INSERTION N/A 03/07/2020   Procedure: PORTA CATH INSERTION;  Surgeon: Cassandra Huxley, MD;  Location: [place] [facility];  Service: Cardiovascular;  Laterality: N/A;  . TEE WITHOUT CARDIOVERSION N/A 03/21/2020   Procedure: TRANSESOPHAGEAL ECHOCARDIOGRAM (TEE);  Surgeon: Cassandra David, MD;  Location: [facility] ORS;  Service: Cardiovascular;  Laterality: N/A;  . TUBAL LIGATION  1993    SOCIAL HISTORY: Social History   Socioeconomic History  . Marital status: Married    Spouse name: Not on file  . Number of children: 2  . Years of education: Not on file  . Highest education level: Not on file  Occupational History  . Occupation: Pharmacist, hospital    Comment: retired  . Occupation: Farmer  Tobacco Use  . Smoking status: Former Smoker    Packs/day: 0.50    Years: 10.00    Pack years: 5.00    Types: Cigarettes    Quit date: 1987    Years since quitting: 35.0  . Smokeless tobacco: Never Used  . Tobacco comment: Quit smoking 1987  Vaping Use  . Vaping Use: Never used  Substance and Sexual Activity  . Alcohol use: Not Currently    Comment: 0-2 mixed drinks a day  . Drug use: No  . Sexual activity: Not Currently    Birth control/protection: Post-menopausal  Other Topics Concern  . Not on file  Social History Narrative  . Not on file   Social Determinants of Health   Financial Resource Strain: Not on file  Food Insecurity: Not on file   Transportation Needs: Not on file  Physical Activity: Not on file  Stress: Not on file  Social Connections: Not on file  Intimate Partner Violence: Not on file    FAMILY HISTORY: Family History  Problem Relation Age of Onset  . Breast cancer Paternal Aunt 79  . Diabetes Mother   . Osteoporosis Mother   . Hyperlipidemia Father   . Rheumatic fever Father   . Valvular heart disease Father   . Cancer Paternal Grandmother        possible colon  . Breast cancer Sister 68    ALLERGIES:  is allergic to penicillin g.  MEDICATIONS:  Current Outpatient Medications  Medication Sig Dispense Refill  . aspirin EC 81 MG tablet Take 1 tablet (81 mg total) by mouth daily. Swallow whole. 150 tablet 2  . atorvastatin (LIPITOR) 40 MG tablet Take 1 tablet (40 mg total) by mouth daily. 30 tablet 3  . Cholecalciferol 25 MCG (1000 UT) tablet Take 2,000 Units by mouth daily.    Marland Kitchen lidocaine-prilocaine (EMLA) cream Apply to affected area once (Patient taking differently: Apply 1 application topically as directed.) 30 g 3  . loperamide (IMODIUM A-D) 2 MG tablet Take 2 at onset  of diarrhea, then 1 every 2hrs until 12hr without a BM. May take 2 tab every 4hrs at bedtime. If diarrhea recurs repeat. (Patient not taking: No sig reported) 100 tablet 1  . magic mouthwash w/lidocaine SOLN Take 5 mLs by mouth 4 (four) times daily as needed for mouth pain. Sig: Swish & Spit 5-10 ml four times a day as needed. Dispense 480 ml. 1RF 480 mL 1  . ondansetron (ZOFRAN) 8 MG tablet Take 1 tablet (8 mg total) by mouth 2 (two) times daily as needed. Start on day 3 after chemotherapy. (Patient not taking: Reported on 06/23/2020) 30 tablet 1  . prochlorperazine (COMPAZINE) 10 MG tablet Take 1 tablet (10 mg total) by mouth every 6 (six) hours as needed (Nausea or vomiting). (Patient not taking: Reported on 06/23/2020) 30 tablet 1  . simethicone (GAS-X) 80 MG chewable tablet Chew 1 tablet (80 mg total) by mouth every 8 (eight) hours  as needed for flatulence. 60 tablet 0   No current facility-administered medications for this visit.     PHYSICAL EXAMINATION: ECOG PERFORMANCE STATUS: 1 - Symptomatic but completely ambulatory Vitals:   07/07/20 0840  BP: 132/82  Pulse: (!) 58  Resp: 18  Temp: 98.4 F (36.9 C)   Filed Weights   07/07/20 0840  Weight: 128 lb 4.8 oz (58.2 kg)    Physical Exam Constitutional:      General: She is not in acute distress. HENT:     Head: Normocephalic and atraumatic.  Eyes:     General: No scleral icterus. Cardiovascular:     Rate and Rhythm: Normal rate and regular rhythm.     Heart sounds: Normal heart sounds.  Pulmonary:     Effort: Pulmonary effort is normal. No respiratory distress.     Breath sounds: No wheezing.  Abdominal:     General: Bowel sounds are normal. There is no distension.     Palpations: Abdomen is soft.  Musculoskeletal:        General: No deformity. Normal range of motion.     Cervical back: Normal range of motion and neck supple.  Skin:    General: Skin is warm and dry.     Coloration: Skin is not jaundiced.     Findings: No erythema or rash.  Neurological:     Mental Status: She is alert and oriented to person, place, and time. Mental status is at baseline.     Cranial Nerves: No cranial nerve deficit.     Coordination: Coordination normal.  Psychiatric:        Mood and Affect: Mood normal.     LABORATORY DATA:  I have reviewed the data as listed Lab Results  Component Value Date   WBC 5.8 06/23/2020   HGB 11.0 (L) 06/23/2020   HCT 32.4 (L) 06/23/2020   MCV 98.2 06/23/2020   PLT 123 (L) 06/23/2020   Recent Labs    02/06/20 1049 02/29/20 1314 03/19/20 1024 03/20/20 0411 03/21/20 0318 04/04/20 0827 05/09/20 0809 06/03/20 0804 06/23/20 0830  NA 137   < > 129* 136 140   < > 142 138 139  K 4.1   < > 3.8 4.0 3.7   < > 3.4* 3.8 3.9  CL 99   < > 95* 104 108   < > 107 102 105  CO2 23   < > 25 25 25    < > 26 26 25   GLUCOSE 113*    < > 145* 126* 114*   < >  147* 114* 102*  BUN 11   < > 11 7* 6*   < > 8 18 10   CREATININE 0.80   < > 0.85 0.66 0.52   < > 0.84 0.64 0.49  CALCIUM 10.2   < > 8.9 8.3* 8.2*   < > 8.8* 9.0 8.8*  GFRNONAA 78   < > >60 >60 >60   < > >60 >60 >60  GFRAA 90   < > >60 >60 >60  --   --   --   --   PROT 7.0   < > 7.1  --  5.3*   < > 6.1* 6.3* 6.3*  ALBUMIN 4.9*   < > 3.5  --  2.5*   < > 3.6 3.9 3.9  AST 195*   < > 30  --  52*   < > 35 38 48*  ALT 366*   < > 43  --  74*   < > 38 43 65*  ALKPHOS 934*   < > 125  --  112   < > 169* 133* 257*  BILITOT 9.9*   < > 1.5*  --  1.0   < > 0.4 0.6 0.4  BILIDIR 8.11*  --   --   --   --   --   --   --   --   IBILI 1.79*  --   --   --   --   --   --   --   --    < > = values in this interval not displayed.   Iron/TIBC/Ferritin/ %Sat No results found for: IRON, TIBC, FERRITIN, IRONPCTSAT    RADIOGRAPHIC STUDIES: I have personally reviewed the radiological images as listed and agreed with the findings in the report. Reviewed findings of MRI abdomen MRCP done at [facility]. MR 3D Recon At Scanner  Result Date: 06/11/2020 CLINICAL DATA:  Follow-up pancreatic carcinoma. Status post chemotherapy. EXAM: MRI ABDOMEN WITHOUT AND WITH CONTRAST TECHNIQUE: Multiplanar multisequence MR imaging of the abdomen was performed both before and after the administration of intravenous contrast. CONTRAST:  38mL GADAVIST GADOBUTROL 1 MMOL/ML IV SOLN COMPARISON:  None. FINDINGS: Lower chest: No acute findings. Hepatobiliary: No hepatic masses identified. Gallbladder is unremarkable. Stent is seen in the common bile duct and there is no evidence of biliary obstruction. Pancreas: Mild diffuse pancreatic atrophy and ductal dilatation is seen. Mild soft tissue fullness is seen in the pancreatic head surrounding the common bile duct stent, however there is no evidence of discrete pancreatic mass. No evidence of peripancreatic inflammatory changes or fluid collections. Spleen:  Within normal limits in  size and appearance. Adrenals/Urinary Tract: No masses identified. No evidence of hydronephrosis. Stomach/Bowel: Visualized portion unremarkable. Vascular/Lymphatic: No pathologically enlarged lymph nodes identified. No abdominal aortic aneurysm. Other:  None. Musculoskeletal:  No suspicious bone lesions identified. IMPRESSION: Mild soft tissue fullness in the pancreatic head surrounding the common bile duct stent, without focal pancreatic mass. No evidence of abdominal metastatic disease or other acute findings. Electronically Signed   By: Marlaine Hind M.D.   On: 06/11/2020 14:56   MR ABDOMEN MRCP W WO CONTAST  Result Date: 06/11/2020 CLINICAL DATA:  Follow-up pancreatic carcinoma. Status post chemotherapy. EXAM: MRI ABDOMEN WITHOUT AND WITH CONTRAST TECHNIQUE: Multiplanar multisequence MR imaging of the abdomen was performed both before and after the administration of intravenous contrast. CONTRAST:  86mL GADAVIST GADOBUTROL 1 MMOL/ML IV SOLN COMPARISON:  None. FINDINGS: Lower chest: No acute findings. Hepatobiliary: No hepatic  masses identified. Gallbladder is unremarkable. Stent is seen in the common bile duct and there is no evidence of biliary obstruction. Pancreas: Mild diffuse pancreatic atrophy and ductal dilatation is seen. Mild soft tissue fullness is seen in the pancreatic head surrounding the common bile duct stent, however there is no evidence of discrete pancreatic mass. No evidence of peripancreatic inflammatory changes or fluid collections. Spleen:  Within normal limits in size and appearance. Adrenals/Urinary Tract: No masses identified. No evidence of hydronephrosis. Stomach/Bowel: Visualized portion unremarkable. Vascular/Lymphatic: No pathologically enlarged lymph nodes identified. No abdominal aortic aneurysm. Other:  None. Musculoskeletal:  No suspicious bone lesions identified. IMPRESSION: Mild soft tissue fullness in the pancreatic head surrounding the common bile duct stent, without  focal pancreatic mass. No evidence of abdominal metastatic disease or other acute findings. Electronically Signed   By: Marlaine Hind M.D.   On: 06/11/2020 14:56     ASSESSMENT & PLAN:  1. Primary pancreatic cancer ([place])   2. Encounter for antineoplastic chemotherapy   3. Anemia due to antineoplastic chemotherapy   4. Hypokalemia   5. 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 Responding well.  Partial response on most recent MRI abdomen Continue FOLFIRINOX, finish total 12 cycles if tolerable and then switch to FOLFIRI for maintenance. If her neuropathy gets worse, may consider dropping oxaliplatin earlier. Patient receives Udenyca on day 4  #Grade 1 transaminitis, stable.  Monitor.   #Irinotecan-associated dysarthria, lip/tongue numbness stable symptoms Irinotecan to be infused over 180 minutes.  Atropine 0.5 mg once prior to the irinotecan.  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  oxaliplatin  75mg /m  #Chemotherapy-induced anemia and thrombocytopenia.  Counts are slightly worse.  Continue to monitor. Patient plans to attend her son's wedding 2 days prior to next chemotherapy treatments.  She prefers to postpone next cycle of treatment for 1 week.  We discussed in details about precautions during COVID-19 pandemic.  She has had Covid 19 vaccination plus booster.  Supportive care measures are necessary for patient well-being and will be provided as necessary. We spent sufficient time to discuss many aspect of care, questions were answered to patient's satisfaction.   All questions were answered. The patient knows to call the clinic with any problems questions or concerns.  Return of visit: 3 weeks per  patient's preference  Cassandra Server, MD, PhD Hematology Oncology [facility] Pager- [phone] 07/07/2020

## 2020-07-07 NOTE — Progress Notes (Unsigned)
Per Pt request Irinotecan stopped approximately 5 minutes early, with a remaining 18 mls to infuse. Pt tolerated infusion well. No s/s of distress or reaction noted. MD team aware. Pt stable at discharge.

## 2020-07-08 LAB — CANCER ANTIGEN 19-9: CA 19-9: 25 U/mL (ref 0–35)

## 2020-07-09 ENCOUNTER — Inpatient Hospital Stay: Payer: BC Managed Care – PPO

## 2020-07-09 DIAGNOSIS — C259 Malignant neoplasm of pancreas, unspecified: Secondary | ICD-10-CM

## 2020-07-09 DIAGNOSIS — Z5111 Encounter for antineoplastic chemotherapy: Secondary | ICD-10-CM | POA: Diagnosis not present

## 2020-07-09 MED ORDER — SODIUM CHLORIDE 0.9% FLUSH
10.0000 mL | INTRAVENOUS | Status: DC | PRN
  Administered 2020-07-09: 10 mL
  Filled 2020-07-09: qty 10

## 2020-07-09 MED ORDER — HEPARIN SOD (PORK) LOCK FLUSH 100 UNIT/ML IV SOLN
INTRAVENOUS | Status: AC
  Filled 2020-07-09: qty 5

## 2020-07-09 MED ORDER — HEPARIN SOD (PORK) LOCK FLUSH 100 UNIT/ML IV SOLN
500.0000 [IU] | Freq: Once | INTRAVENOUS | Status: AC | PRN
  Administered 2020-07-09: 500 [IU]
  Filled 2020-07-09: qty 5

## 2020-07-10 ENCOUNTER — Other Ambulatory Visit: Payer: Self-pay

## 2020-07-10 ENCOUNTER — Inpatient Hospital Stay: Payer: BC Managed Care – PPO

## 2020-07-10 DIAGNOSIS — Z5111 Encounter for antineoplastic chemotherapy: Secondary | ICD-10-CM | POA: Diagnosis not present

## 2020-07-10 DIAGNOSIS — C259 Malignant neoplasm of pancreas, unspecified: Secondary | ICD-10-CM

## 2020-07-10 MED ORDER — PEGFILGRASTIM-CBQV 6 MG/0.6ML ~~LOC~~ SOSY
6.0000 mg | PREFILLED_SYRINGE | Freq: Once | SUBCUTANEOUS | Status: AC
  Administered 2020-07-10: 6 mg via SUBCUTANEOUS
  Filled 2020-07-10: qty 0.6

## 2020-07-28 ENCOUNTER — Inpatient Hospital Stay (HOSPITAL_BASED_OUTPATIENT_CLINIC_OR_DEPARTMENT_OTHER): Payer: BC Managed Care – PPO | Admitting: Oncology

## 2020-07-28 ENCOUNTER — Inpatient Hospital Stay: Payer: BC Managed Care – PPO

## 2020-07-28 ENCOUNTER — Encounter: Payer: Self-pay | Admitting: Oncology

## 2020-07-28 VITALS — BP 113/75 | HR 64 | Temp 99.0°F | Resp 18 | Wt 127.9 lb

## 2020-07-28 DIAGNOSIS — D696 Thrombocytopenia, unspecified: Secondary | ICD-10-CM | POA: Diagnosis not present

## 2020-07-28 DIAGNOSIS — Z5111 Encounter for antineoplastic chemotherapy: Secondary | ICD-10-CM

## 2020-07-28 DIAGNOSIS — C259 Malignant neoplasm of pancreas, unspecified: Secondary | ICD-10-CM

## 2020-07-28 DIAGNOSIS — D6481 Anemia due to antineoplastic chemotherapy: Secondary | ICD-10-CM | POA: Diagnosis not present

## 2020-07-28 DIAGNOSIS — E876 Hypokalemia: Secondary | ICD-10-CM

## 2020-07-28 DIAGNOSIS — G620 Drug-induced polyneuropathy: Secondary | ICD-10-CM

## 2020-07-28 DIAGNOSIS — T451X5A Adverse effect of antineoplastic and immunosuppressive drugs, initial encounter: Secondary | ICD-10-CM

## 2020-07-28 LAB — CBC WITH DIFFERENTIAL/PLATELET
Abs Immature Granulocytes: 0.03 10*3/uL (ref 0.00–0.07)
Basophils Absolute: 0 10*3/uL (ref 0.0–0.1)
Basophils Relative: 0 %
Eosinophils Absolute: 0.1 10*3/uL (ref 0.0–0.5)
Eosinophils Relative: 1 %
HCT: 31.9 % — ABNORMAL LOW (ref 36.0–46.0)
Hemoglobin: 10.9 g/dL — ABNORMAL LOW (ref 12.0–15.0)
Immature Granulocytes: 1 %
Lymphocytes Relative: 22 %
Lymphs Abs: 1.1 10*3/uL (ref 0.7–4.0)
MCH: 34.1 pg — ABNORMAL HIGH (ref 26.0–34.0)
MCHC: 34.2 g/dL (ref 30.0–36.0)
MCV: 99.7 fL (ref 80.0–100.0)
Monocytes Absolute: 0.5 10*3/uL (ref 0.1–1.0)
Monocytes Relative: 11 %
Neutro Abs: 3.3 10*3/uL (ref 1.7–7.7)
Neutrophils Relative %: 65 %
Platelets: 141 10*3/uL — ABNORMAL LOW (ref 150–400)
RBC: 3.2 MIL/uL — ABNORMAL LOW (ref 3.87–5.11)
RDW: 15.3 % (ref 11.5–15.5)
WBC: 5.1 10*3/uL (ref 4.0–10.5)
nRBC: 0 % (ref 0.0–0.2)

## 2020-07-28 LAB — COMPREHENSIVE METABOLIC PANEL
ALT: 52 U/L — ABNORMAL HIGH (ref 0–44)
AST: 49 U/L — ABNORMAL HIGH (ref 15–41)
Albumin: 3.8 g/dL (ref 3.5–5.0)
Alkaline Phosphatase: 222 U/L — ABNORMAL HIGH (ref 38–126)
Anion gap: 9 (ref 5–15)
BUN: 13 mg/dL (ref 8–23)
CO2: 25 mmol/L (ref 22–32)
Calcium: 8.5 mg/dL — ABNORMAL LOW (ref 8.9–10.3)
Chloride: 104 mmol/L (ref 98–111)
Creatinine, Ser: 0.65 mg/dL (ref 0.44–1.00)
GFR, Estimated: >60 mL/min (ref >60)
Glucose, Bld: 120 mg/dL — ABNORMAL HIGH (ref 70–99)
Potassium: 4 mmol/L (ref 3.5–5.1)
Sodium: 138 mmol/L (ref 135–145)
Total Bilirubin: 0.5 mg/dL (ref 0.3–1.2)
Total Protein: 6.5 g/dL (ref 6.5–8.1)

## 2020-07-28 MED ORDER — ATROPINE SULFATE 1 MG/ML IJ SOLN
0.5000 mg | Freq: Once | INTRAMUSCULAR | Status: AC
  Administered 2020-07-28: 0.5 mg via INTRAVENOUS
  Filled 2020-07-28: qty 1

## 2020-07-28 MED ORDER — SODIUM CHLORIDE 0.9 % IV SOLN
650.0000 mg | Freq: Once | INTRAVENOUS | Status: AC
Start: 2020-07-28 — End: 2020-07-28
  Administered 2020-07-28: 650 mg via INTRAVENOUS
  Filled 2020-07-28: qty 32.5

## 2020-07-28 MED ORDER — SODIUM CHLORIDE 0.9 % IV SOLN
10.0000 mg | Freq: Once | INTRAVENOUS | Status: AC
  Administered 2020-07-28: 10 mg via INTRAVENOUS
  Filled 2020-07-28: qty 10

## 2020-07-28 MED ORDER — DIPHENHYDRAMINE HCL 25 MG PO CAPS
50.0000 mg | ORAL_CAPSULE | Freq: Once | ORAL | Status: AC
Start: 2020-07-28 — End: 2020-07-28
  Administered 2020-07-28: 50 mg via ORAL
  Filled 2020-07-28: qty 2

## 2020-07-28 MED ORDER — FOSAPREPITANT DIMEGLUMINE INJECTION 150 MG
150.0000 mg | Freq: Once | INTRAVENOUS | Status: AC
  Administered 2020-07-28: 150 mg via INTRAVENOUS
  Filled 2020-07-28: qty 150

## 2020-07-28 MED ORDER — PALONOSETRON HCL INJECTION 0.25 MG/5ML
0.2500 mg | Freq: Once | INTRAVENOUS | Status: AC
  Administered 2020-07-28: 0.25 mg via INTRAVENOUS
  Filled 2020-07-28: qty 5

## 2020-07-28 MED ORDER — SODIUM CHLORIDE 0.9% FLUSH
10.0000 mL | Freq: Once | INTRAVENOUS | Status: AC
  Administered 2020-07-28: 10 mL via INTRAVENOUS
  Filled 2020-07-28: qty 10

## 2020-07-28 MED ORDER — DIPHENHYDRAMINE HCL 25 MG PO CAPS
25.0000 mg | ORAL_CAPSULE | Freq: Once | ORAL | Status: AC
  Administered 2020-07-28: 25 mg via ORAL
  Filled 2020-07-28: qty 1

## 2020-07-28 MED ORDER — SODIUM CHLORIDE 0.9 % IV SOLN
2400.0000 mg/m2 | INTRAVENOUS | Status: DC
  Administered 2020-07-28: 4000 mg via INTRAVENOUS
  Filled 2020-07-28: qty 80

## 2020-07-28 MED ORDER — DEXTROSE 5 % IV SOLN
Freq: Once | INTRAVENOUS | Status: AC
  Filled 2020-07-28: qty 250

## 2020-07-28 MED ORDER — SODIUM CHLORIDE 0.9 % IV SOLN
150.0000 mg/m2 | Freq: Once | INTRAVENOUS | Status: AC
  Administered 2020-07-28: 260 mg via INTRAVENOUS
  Filled 2020-07-28: qty 10

## 2020-07-28 MED ORDER — HEPARIN SOD (PORK) LOCK FLUSH 100 UNIT/ML IV SOLN
500.0000 [IU] | Freq: Once | INTRAVENOUS | Status: DC
  Filled 2020-07-28: qty 5

## 2020-07-28 MED ORDER — OXALIPLATIN CHEMO INJECTION 100 MG/20ML
65.0000 mg/m2 | Freq: Once | INTRAVENOUS | Status: AC
  Administered 2020-07-28: 110 mg via INTRAVENOUS
  Filled 2020-07-28: qty 20

## 2020-07-28 NOTE — Progress Notes (Signed)
Hematology/Oncology follow up note [facility] [phone] [phone]   Patient Care Team: Flinchum, Eula Fried, FNP as PCP - General (Family Medicine) Flinchum, Eula Fried, FNP (Family Medicine) Benita Gutter, RN as Oncology Nurse Navigator [name], MD as Consulting Physician (Oncology)  REFERRING PROVIDER: Stephanie Acre*  CHIEF COMPLAINTS/REASON FOR VISIT:  Follow up for treatment of pancreatic adenocarcinoma  HISTORY OF PRESENTING ILLNESS:   Cassandra [name] is a  65 y.o.  female with PMH listed below was seen in consultation at the request of  Flinchum, Michelle S, F*  for evaluation of pancreatic adenocarcinoma Patient initially presented with jaundice, transaminitis, bilirubin was 9.9.  CA 19-9 was 1874.  Patient also reports unintentional weight loss. 02/08/2020 MRI abdomen and MRCP with and without contrast was done at [facility] which showed pancreatic head mass measuring up to 3 cm, with marked associated narrowing of the portal vein confluence.  SMA is preserved.  Marked intrahepatic and extrahepatic biliary duct dilatation as well as mild dilatation of the main pancreatic duct. Multiple hepatic masses highly concerning for metastatic disease.  Patient underwent EUS on 02/19/2020, which showed irregular mass identified in the pancreatic head, hypoechoic, measured 58mmx33mm, sonographic evidence concerning for invasion into the superior mesenteric artery.  There is no sign of significant abnormality in the main pancreatic duct.  Dilatation of common bile duct which measured up to 16 mm.  Region of celiac artery was visualized and showed no signs of significant abnormality.  No lymphadenopathy.  FNA showed adenocarcinoma.  02/19/2020, ERCP, malignant.  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  The stricture was treated with placement of wall flex metal stent.  Patient was  seen by [facility] oncology Dr. Olga Millers and was recommended for 3 drug regimen FOLFIRINOX.  Patient prefers to do chemotherapy locally at [facility].  Patient was referred to establish care today. She denies any pain.  Since stent placement, skin jaundice has improved.  Itchiness has also improved. Patient was accompanied by her husband today.  She has a family history of breast cancer in sister and paternal aunt, colon cancer paternal grandmother.  #no reportable targetable mutation on NGS 9/14/2021cycle 1 FOLFIRINOX.  Patient received oxaliplatin and about 50% of Irinotecan on day 1 and had experienced neurologic symptoms.  She went to ER and working diagnosis is TIA. # 03/19/2020-03/21/2020 patient was admitted due to sepsis with strep pneumonia bacteremia.  Patient was treated with IV Rocephin.  TEE was done which showed no vegetation.  No PFO or ASD.  Patient was discharged home and he finished full course of 14 days of IV Rocephin on 04/02/2020 per ID recommendation.  Repeat blood culture was also negative.    #NGS showed no reportable targetable mutation #Genetic testing-[employer] diagnostic testing showed no pathological variants identified. She has of [facility] EGFR and NBN.  INTERVAL HISTORY Cassandra [name] is a 65 y.o. female who has above history reviewed by me today presents for acute visit.   Problems and complaints are listed below Patient reports feeling well today Patient attended her son's wedding last week.  Today she reports feeling well. After last chemotherapy, she has experienced worsening of bilateral hand neuropathy for 3 days, symptoms eventually improved.  She has some numbness and tingling of her fingertips now at baseline. Denies any fever, chills, nausea vomiting diarrhea.  She reports that intermittent bloating.   Review of Systems  Constitutional: Negative for appetite  change, chills, fatigue, fever and unexpected weight change.  HENT:   Negative for hearing loss and voice change.    Eyes: Negative for eye problems.  Respiratory: Negative for chest tightness and cough.   Cardiovascular: Negative for chest pain.  Gastrointestinal: Negative for abdominal distention, abdominal pain and blood in stool.  Endocrine: Negative for hot flashes.  Genitourinary: Negative for difficulty urinating and frequency.   Musculoskeletal: Negative for arthralgias.  Skin: Negative for itching and rash.  Neurological: Positive for numbness. Negative for extremity weakness.  Hematological: Negative for adenopathy.  Psychiatric/Behavioral: Negative for confusion.    MEDICAL HISTORY:  Past Medical History:  Diagnosis Date  . Cancer [facility])    pancreatic cancer  . Colon polyps   . Family history of breast cancer   . Neutropenia ([place]) 04/18/2020  . Osteopenia after menopause 05/2017   femoral neck T score -2.0    SURGICAL HISTORY: Past Surgical History:  Procedure Laterality Date  . COLONOSCOPY  07/2015   WNL  . COLONOSCOPY  2008/2011  . OVARIAN CYST REMOVAL  1992   dermoid-Dr [place]  . PORTA CATH INSERTION N/A 03/07/2020   Procedure: PORTA CATH INSERTION;  Surgeon: Algernon Huxley, MD;  Location: [facility];  Service: Cardiovascular;  Laterality: N/A;  . TEE WITHOUT CARDIOVERSION N/A 03/21/2020   Procedure: TRANSESOPHAGEAL ECHOCARDIOGRAM (TEE);  Surgeon: Dionisio David, MD;  Location: [facility] ORS;  Service: Cardiovascular;  Laterality: N/A;  . TUBAL LIGATION  1993    SOCIAL HISTORY: Social History   Socioeconomic History  . Marital status: Married    Spouse name: Not on file  . Number of children: 2  . Years of education: Not on file  . Highest education level: Not on file  Occupational History  . Occupation: Pharmacist, hospital    Comment: retired  . Occupation: Farmer  Tobacco Use  . Smoking status: Former Smoker    Packs/day: 0.50    Years: 10.00    Pack years: 5.00    Types: Cigarettes    Quit date: 1987    Years since quitting: 35.1  . Smokeless tobacco: Never Used  .  Tobacco comment: Quit smoking 1987  Vaping Use  . Vaping Use: Never used  Substance and Sexual Activity  . Alcohol use: Not Currently    Comment: 0-2 mixed drinks a day  . Drug use: No  . Sexual activity: Not Currently    Birth control/protection: Post-menopausal  Other Topics Concern  . Not on file  Social History Narrative  . Not on file   Social Determinants of Health   Financial Resource Strain: Not on file  Food Insecurity: Not on file  Transportation Needs: Not on file  Physical Activity: Not on file  Stress: Not on file  Social Connections: Not on file  Intimate Partner Violence: Not on file    FAMILY HISTORY: Family History  Problem Relation Age of Onset  . Breast cancer Paternal Aunt 46  . Diabetes Mother   . Osteoporosis Mother   . Hyperlipidemia Father   . Rheumatic fever Father   . Valvular heart disease Father   . Cancer Paternal Grandmother        possible colon  . Breast cancer Sister 10    ALLERGIES:  is allergic to penicillin g.  MEDICATIONS:  Current Outpatient Medications  Medication Sig Dispense Refill  . aspirin EC 81 MG tablet Take 1 tablet (81 mg total) by mouth daily. Swallow whole. 150 tablet 2  . atorvastatin (  LIPITOR) 40 MG tablet Take 1 tablet (40 mg total) by mouth daily. 30 tablet 3  . Cholecalciferol 25 MCG (1000 UT) tablet Take 2,000 Units by mouth daily.    Marland Kitchen lidocaine-prilocaine (EMLA) cream Apply to affected area once (Patient taking differently: Apply 1 application topically as directed.) 30 g 3  . loperamide (IMODIUM A-D) 2 MG tablet Take 2 at onset of diarrhea, then 1 every 2hrs until 12hr without a BM. May take 2 tab every 4hrs at bedtime. If diarrhea recurs repeat. (Patient not taking: No sig reported) 100 tablet 1  . magic mouthwash w/lidocaine SOLN Take 5 mLs by mouth 4 (four) times daily as needed for mouth pain. Sig: Swish & Spit 5-10 ml four times a day as needed. Dispense 480 ml. 1RF (Patient not taking: Reported on  07/07/2020) 480 mL 1  . ondansetron (ZOFRAN) 8 MG tablet Take 1 tablet (8 mg total) by mouth 2 (two) times daily as needed. Start on day 3 after chemotherapy. 30 tablet 1  . potassium chloride SA (KLOR-CON) 20 MEQ tablet Take 1 tablet (20 mEq total) by mouth daily. 30 tablet 0  . prochlorperazine (COMPAZINE) 10 MG tablet Take 1 tablet (10 mg total) by mouth every 6 (six) hours as needed (Nausea or vomiting). (Patient not taking: No sig reported) 30 tablet 1  . simethicone (GAS-X) 80 MG chewable tablet Chew 1 tablet (80 mg total) by mouth every 8 (eight) hours as needed for flatulence. (Patient not taking: Reported on 07/07/2020) 60 tablet 0   No current facility-administered medications for this visit.   Facility-Administered Medications Ordered in Other Visits  Medication Dose Route Frequency Provider Last Rate Last Admin  . heparin lock flush 100 unit/mL  500 Units Intravenous Once [name], MD      . sodium chloride flush (NS) 0.9 % injection 10 mL  10 mL Intravenous Once [name], MD         PHYSICAL EXAMINATION: ECOG PERFORMANCE STATUS: 1 - Symptomatic but completely ambulatory Vitals:   07/28/20 0851  BP: 113/75  Pulse: 64  Resp: 18  Temp: 99 F (37.2 C)   Filed Weights   07/28/20 0851  Weight: 127 lb 14.4 oz (58 kg)    Physical Exam Constitutional:      General: She is not in acute distress. HENT:     Head: Normocephalic and atraumatic.  Eyes:     General: No scleral icterus. Cardiovascular:     Rate and Rhythm: Normal rate and regular rhythm.     Heart sounds: Normal heart sounds.  Pulmonary:     Effort: Pulmonary effort is normal. No respiratory distress.     Breath sounds: No wheezing.  Abdominal:     General: Bowel sounds are normal. There is no distension.     Palpations: Abdomen is soft.  Musculoskeletal:        General: No deformity. Normal range of motion.     Cervical back: Normal range of motion and neck supple.  Skin:    General: Skin is warm and dry.      Coloration: Skin is not jaundiced.     Findings: No erythema or rash.  Neurological:     Mental Status: She is alert and oriented to person, place, and time. Mental status is at baseline.     Cranial Nerves: No cranial nerve deficit.     Coordination: Coordination normal.  Psychiatric:        Mood and Affect: Mood normal.  LABORATORY DATA:  I have reviewed the data as listed Lab Results  Component Value Date   WBC 8.0 07/07/2020   HGB 10.8 (L) 07/07/2020   HCT 30.6 (L) 07/07/2020   MCV 96.2 07/07/2020   PLT 126 (L) 07/07/2020   Recent Labs    02/06/20 1049 02/29/20 1314 03/19/20 1024 03/20/20 0411 03/21/20 0318 04/04/20 0827 06/03/20 0804 06/23/20 0830 07/07/20 0807  NA 137   < > 129* 136 140   < > 138 139 140  K 4.1   < > 3.8 4.0 3.7   < > 3.8 3.9 3.1*  CL 99   < > 95* 104 108   < > 102 105 108  CO2 23   < > $R'25 25 25   'LB$ < > $R'26 25 25  'SD$ GLUCOSE 113*   < > 145* 126* 114*   < > 114* 102* 132*  BUN 11   < > 11 7* 6*   < > $R'18 10 12  'ZJ$ CREATININE 0.80   < > 0.85 0.66 0.52   < > 0.64 0.49 0.58  CALCIUM 10.2   < > 8.9 8.3* 8.2*   < > 9.0 8.8* 8.5*  GFRNONAA 78   < > >60 >60 >60   < > >60 >60 >60  GFRAA 90   < > >60 >60 >60  --   --   --   --   PROT 7.0   < > 7.1  --  5.3*   < > 6.3* 6.3* 6.1*  ALBUMIN 4.9*   < > 3.5  --  2.5*   < > 3.9 3.9 3.9  AST 195*   < > 30  --  52*   < > 38 48* 44*  ALT 366*   < > 43  --  74*   < > 43 65* 40  ALKPHOS 934*   < > 125  --  112   < > 133* 257* 191*  BILITOT 9.9*   < > 1.5*  --  1.0   < > 0.6 0.4 0.5  BILIDIR 8.11*  --   --   --   --   --   --   --   --   IBILI 1.79*  --   --   --   --   --   --   --   --    < > = values in this interval not displayed.   Iron/TIBC/Ferritin/ %Sat No results found for: IRON, TIBC, FERRITIN, IRONPCTSAT    RADIOGRAPHIC STUDIES: I have personally reviewed the radiological images as listed and agreed with the findings in the report. Reviewed findings of MRI abdomen MRCP done at [facility]. MR 3D Recon At  Scanner  Result Date: 06/11/2020 CLINICAL DATA:  Follow-up pancreatic carcinoma. Status post chemotherapy. EXAM: MRI ABDOMEN WITHOUT AND WITH CONTRAST TECHNIQUE: Multiplanar multisequence MR imaging of the abdomen was performed both before and after the administration of intravenous contrast. CONTRAST:  72mL GADAVIST GADOBUTROL 1 MMOL/ML IV SOLN COMPARISON:  None. FINDINGS: Lower chest: No acute findings. Hepatobiliary: No hepatic masses identified. Gallbladder is unremarkable. Stent is seen in the common bile duct and there is no evidence of biliary obstruction. Pancreas: Mild diffuse pancreatic atrophy and ductal dilatation is seen. Mild soft tissue fullness is seen in the pancreatic head surrounding the common bile duct stent, however there is no evidence of discrete pancreatic mass. No evidence of peripancreatic inflammatory changes or fluid collections. Spleen:  Within  normal limits in size and appearance. Adrenals/Urinary Tract: No masses identified. No evidence of hydronephrosis. Stomach/Bowel: Visualized portion unremarkable. Vascular/Lymphatic: No pathologically enlarged lymph nodes identified. No abdominal aortic aneurysm. Other:  None. Musculoskeletal:  No suspicious bone lesions identified. IMPRESSION: Mild soft tissue fullness in the pancreatic head surrounding the common bile duct stent, without focal pancreatic mass. No evidence of abdominal metastatic disease or other acute findings. Electronically Signed   By: Marlaine Hind M.D.   On: 06/11/2020 14:56   MR ABDOMEN MRCP W WO CONTAST  Result Date: 06/11/2020 CLINICAL DATA:  Follow-up pancreatic carcinoma. Status post chemotherapy. EXAM: MRI ABDOMEN WITHOUT AND WITH CONTRAST TECHNIQUE: Multiplanar multisequence MR imaging of the abdomen was performed both before and after the administration of intravenous contrast. CONTRAST:  13mL GADAVIST GADOBUTROL 1 MMOL/ML IV SOLN COMPARISON:  None. FINDINGS: Lower chest: No acute findings. Hepatobiliary: No  hepatic masses identified. Gallbladder is unremarkable. Stent is seen in the common bile duct and there is no evidence of biliary obstruction. Pancreas: Mild diffuse pancreatic atrophy and ductal dilatation is seen. Mild soft tissue fullness is seen in the pancreatic head surrounding the common bile duct stent, however there is no evidence of discrete pancreatic mass. No evidence of peripancreatic inflammatory changes or fluid collections. Spleen:  Within normal limits in size and appearance. Adrenals/Urinary Tract: No masses identified. No evidence of hydronephrosis. Stomach/Bowel: Visualized portion unremarkable. Vascular/Lymphatic: No pathologically enlarged lymph nodes identified. No abdominal aortic aneurysm. Other:  None. Musculoskeletal:  No suspicious bone lesions identified. IMPRESSION: Mild soft tissue fullness in the pancreatic head surrounding the common bile duct stent, without focal pancreatic mass. No evidence of abdominal metastatic disease or other acute findings. Electronically Signed   By: Marlaine Hind M.D.   On: 06/11/2020 14:56     ASSESSMENT & PLAN:  1. Primary pancreatic cancer ([place])   2. Encounter for antineoplastic chemotherapy   3. Anemia due to antineoplastic chemotherapy   4. Thrombocytopenia ([place])   5. 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 Responding well.  Partial response on most recent MRI abdomen Labs are reviewed and discussed with patient.  Proceed with treatment today. Continue FOLFIRINOX, -plan to finish 12 cycles switch to FOLFIRI for maintenance Continue FOLFIRINOX, finish total 12 cycles if tolerable and then switch to FOLFIRI for maintenance. Patient receives Udenyca on day 4  #Grade 1  transaminitis, stable.  Monitor #Irinotecan-associated dysarthria, lip/tongue numbness stable symptoms Irinotecan to be infused over 180 minutes.  Atropine 0.5 mg once prior to the irinotecan.  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  I recommend patient to call neurology and get recommendation to start treatments.  oxaliplatin -second dose reduction at $RemoveBefo'65mg'kvfnsptGlKL$ /m  #Chemotherapy-induced anemia and thrombocytopenia.   Stable.  Continue to monitor. Supportive care measures are necessary for patient well-being and will be provided as necessary. We spent sufficient time to discuss many aspect of care, questions were answered to patient's satisfaction.   All questions were answered. The patient knows to call the clinic with any problems questions or concerns.  Return of visit: 2 weeks  [name], MD, PhD Hematology Oncology [facility] at [facility] Pager- [phone] 07/28/2020

## 2020-07-28 NOTE — Progress Notes (Signed)
Patient had an increase in neuropathy after last treatment.

## 2020-07-28 NOTE — Progress Notes (Signed)
Pt tolerated all infusions well today with premedications administered as ordered.  Pt left infusion suite stable and ambulatory.

## 2020-07-29 LAB — CANCER ANTIGEN 19-9: CA 19-9: 15 U/mL (ref 0–35)

## 2020-07-30 ENCOUNTER — Inpatient Hospital Stay: Payer: BC Managed Care – PPO | Attending: Oncology

## 2020-07-30 DIAGNOSIS — Z5111 Encounter for antineoplastic chemotherapy: Secondary | ICD-10-CM | POA: Insufficient documentation

## 2020-07-30 DIAGNOSIS — Z5189 Encounter for other specified aftercare: Secondary | ICD-10-CM | POA: Diagnosis not present

## 2020-07-30 DIAGNOSIS — Z452 Encounter for adjustment and management of vascular access device: Secondary | ICD-10-CM | POA: Diagnosis not present

## 2020-07-30 DIAGNOSIS — C250 Malignant neoplasm of head of pancreas: Secondary | ICD-10-CM | POA: Insufficient documentation

## 2020-07-30 DIAGNOSIS — C787 Secondary malignant neoplasm of liver and intrahepatic bile duct: Secondary | ICD-10-CM | POA: Diagnosis not present

## 2020-07-30 DIAGNOSIS — C259 Malignant neoplasm of pancreas, unspecified: Secondary | ICD-10-CM

## 2020-07-30 MED ORDER — HEPARIN SOD (PORK) LOCK FLUSH 100 UNIT/ML IV SOLN
INTRAVENOUS | Status: AC
  Filled 2020-07-30: qty 5

## 2020-07-30 MED ORDER — HEPARIN SOD (PORK) LOCK FLUSH 100 UNIT/ML IV SOLN
500.0000 [IU] | Freq: Once | INTRAVENOUS | Status: AC | PRN
  Administered 2020-07-30: 500 [IU]
  Filled 2020-07-30: qty 5

## 2020-07-30 MED ORDER — SODIUM CHLORIDE 0.9% FLUSH
10.0000 mL | INTRAVENOUS | Status: DC | PRN
Start: 2020-07-30 — End: 2020-07-30
  Administered 2020-07-30: 10 mL
  Filled 2020-07-30: qty 10

## 2020-07-31 ENCOUNTER — Other Ambulatory Visit: Payer: Self-pay | Admitting: Oncology

## 2020-07-31 ENCOUNTER — Inpatient Hospital Stay: Payer: BC Managed Care – PPO

## 2020-07-31 DIAGNOSIS — Z5111 Encounter for antineoplastic chemotherapy: Secondary | ICD-10-CM | POA: Diagnosis not present

## 2020-07-31 DIAGNOSIS — C259 Malignant neoplasm of pancreas, unspecified: Secondary | ICD-10-CM

## 2020-07-31 MED ORDER — PEGFILGRASTIM-CBQV 6 MG/0.6ML ~~LOC~~ SOSY
6.0000 mg | PREFILLED_SYRINGE | Freq: Once | SUBCUTANEOUS | Status: AC
  Administered 2020-07-31: 6 mg via SUBCUTANEOUS
  Filled 2020-07-31: qty 0.6

## 2020-08-01 NOTE — Telephone Encounter (Signed)
  Component Ref Range & Units 4 d ago  (07/28/20) 3 wk ago  (07/07/20) 1 mo ago  (06/23/20) 1 mo ago  (06/03/20) 2 mo ago  (05/09/20) 3 mo ago  (04/25/20) 3 mo ago  (04/21/20)  Potassium 3.5 - 5.1 mmol/L 4.0  3.1Low  3.9

## 2020-08-05 ENCOUNTER — Other Ambulatory Visit: Payer: Self-pay | Admitting: Oncology

## 2020-08-11 ENCOUNTER — Inpatient Hospital Stay (HOSPITAL_BASED_OUTPATIENT_CLINIC_OR_DEPARTMENT_OTHER): Payer: BC Managed Care – PPO | Admitting: Oncology

## 2020-08-11 ENCOUNTER — Inpatient Hospital Stay: Payer: BC Managed Care – PPO

## 2020-08-11 ENCOUNTER — Encounter: Payer: Self-pay | Admitting: Oncology

## 2020-08-11 VITALS — BP 128/84 | HR 58 | Temp 98.2°F | Resp 18 | Wt 125.6 lb

## 2020-08-11 DIAGNOSIS — D696 Thrombocytopenia, unspecified: Secondary | ICD-10-CM | POA: Insufficient documentation

## 2020-08-11 DIAGNOSIS — Z5111 Encounter for antineoplastic chemotherapy: Secondary | ICD-10-CM

## 2020-08-11 DIAGNOSIS — G620 Drug-induced polyneuropathy: Secondary | ICD-10-CM

## 2020-08-11 DIAGNOSIS — C259 Malignant neoplasm of pancreas, unspecified: Secondary | ICD-10-CM | POA: Diagnosis not present

## 2020-08-11 DIAGNOSIS — R140 Abdominal distension (gaseous): Secondary | ICD-10-CM

## 2020-08-11 DIAGNOSIS — D6481 Anemia due to antineoplastic chemotherapy: Secondary | ICD-10-CM | POA: Insufficient documentation

## 2020-08-11 DIAGNOSIS — R7401 Elevation of levels of liver transaminase levels: Secondary | ICD-10-CM

## 2020-08-11 DIAGNOSIS — T451X5A Adverse effect of antineoplastic and immunosuppressive drugs, initial encounter: Secondary | ICD-10-CM

## 2020-08-11 LAB — COMPREHENSIVE METABOLIC PANEL
ALT: 103 U/L — ABNORMAL HIGH (ref 0–44)
AST: 60 U/L — ABNORMAL HIGH (ref 15–41)
Albumin: 3.9 g/dL (ref 3.5–5.0)
Alkaline Phosphatase: 309 U/L — ABNORMAL HIGH (ref 38–126)
Anion gap: 11 (ref 5–15)
BUN: 8 mg/dL (ref 8–23)
CO2: 24 mmol/L (ref 22–32)
Calcium: 8.6 mg/dL — ABNORMAL LOW (ref 8.9–10.3)
Chloride: 107 mmol/L (ref 98–111)
Creatinine, Ser: 0.64 mg/dL (ref 0.44–1.00)
GFR, Estimated: >60 mL/min (ref >60)
Glucose, Bld: 129 mg/dL — ABNORMAL HIGH (ref 70–99)
Potassium: 3.4 mmol/L — ABNORMAL LOW (ref 3.5–5.1)
Sodium: 142 mmol/L (ref 135–145)
Total Bilirubin: 0.4 mg/dL (ref 0.3–1.2)
Total Protein: 6.6 g/dL (ref 6.5–8.1)

## 2020-08-11 LAB — CBC WITH DIFFERENTIAL/PLATELET
Abs Immature Granulocytes: 0.16 10*3/uL — ABNORMAL HIGH (ref 0.00–0.07)
Basophils Absolute: 0 10*3/uL (ref 0.0–0.1)
Basophils Relative: 0 %
Eosinophils Absolute: 0.1 10*3/uL (ref 0.0–0.5)
Eosinophils Relative: 1 %
HCT: 31.1 % — ABNORMAL LOW (ref 36.0–46.0)
Hemoglobin: 10.8 g/dL — ABNORMAL LOW (ref 12.0–15.0)
Immature Granulocytes: 2 %
Lymphocytes Relative: 17 %
Lymphs Abs: 1.7 10*3/uL (ref 0.7–4.0)
MCH: 35 pg — ABNORMAL HIGH (ref 26.0–34.0)
MCHC: 34.7 g/dL (ref 30.0–36.0)
MCV: 100.6 fL — ABNORMAL HIGH (ref 80.0–100.0)
Monocytes Absolute: 0.8 10*3/uL (ref 0.1–1.0)
Monocytes Relative: 8 %
Neutro Abs: 7.2 10*3/uL (ref 1.7–7.7)
Neutrophils Relative %: 72 %
Platelets: 111 10*3/uL — ABNORMAL LOW (ref 150–400)
RBC: 3.09 MIL/uL — ABNORMAL LOW (ref 3.87–5.11)
RDW: 15.1 % (ref 11.5–15.5)
WBC: 9.9 10*3/uL (ref 4.0–10.5)
nRBC: 0 % (ref 0.0–0.2)

## 2020-08-11 MED ORDER — SODIUM CHLORIDE 0.9 % IV SOLN
Freq: Once | INTRAVENOUS | Status: AC
  Filled 2020-08-11: qty 250

## 2020-08-11 MED ORDER — OXALIPLATIN CHEMO INJECTION 100 MG/20ML
65.0000 mg/m2 | Freq: Once | INTRAVENOUS | Status: AC
  Administered 2020-08-11: 110 mg via INTRAVENOUS
  Filled 2020-08-11: qty 20

## 2020-08-11 MED ORDER — DIPHENHYDRAMINE HCL 25 MG PO CAPS
50.0000 mg | ORAL_CAPSULE | Freq: Once | ORAL | Status: AC
  Administered 2020-08-11: 50 mg via ORAL
  Filled 2020-08-11: qty 2

## 2020-08-11 MED ORDER — SODIUM CHLORIDE 0.9 % IV SOLN
10.0000 mg | Freq: Once | INTRAVENOUS | Status: AC
  Administered 2020-08-11: 10 mg via INTRAVENOUS
  Filled 2020-08-11: qty 10

## 2020-08-11 MED ORDER — DIPHENHYDRAMINE HCL 25 MG PO CAPS
25.0000 mg | ORAL_CAPSULE | Freq: Once | ORAL | Status: AC
  Administered 2020-08-11: 25 mg via ORAL
  Filled 2020-08-11: qty 1

## 2020-08-11 MED ORDER — DEXTROSE 5 % IV SOLN
Freq: Once | INTRAVENOUS | Status: AC
  Filled 2020-08-11: qty 250

## 2020-08-11 MED ORDER — SODIUM CHLORIDE 0.9 % IV SOLN
150.0000 mg/m2 | Freq: Once | INTRAVENOUS | Status: AC
  Administered 2020-08-11: 260 mg via INTRAVENOUS
  Filled 2020-08-11: qty 13

## 2020-08-11 MED ORDER — SODIUM CHLORIDE 0.9 % IV SOLN
150.0000 mg | Freq: Once | INTRAVENOUS | Status: AC
  Administered 2020-08-11: 150 mg via INTRAVENOUS
  Filled 2020-08-11: qty 150

## 2020-08-11 MED ORDER — HEPARIN SOD (PORK) LOCK FLUSH 100 UNIT/ML IV SOLN
500.0000 [IU] | Freq: Once | INTRAVENOUS | Status: DC
  Filled 2020-08-11: qty 5

## 2020-08-11 MED ORDER — SODIUM CHLORIDE 0.9 % IV SOLN
2400.0000 mg/m2 | INTRAVENOUS | Status: DC
  Administered 2020-08-11: 4000 mg via INTRAVENOUS
  Filled 2020-08-11: qty 80

## 2020-08-11 MED ORDER — SODIUM CHLORIDE 0.9 % IV SOLN
650.0000 mg | Freq: Once | INTRAVENOUS | Status: AC
  Administered 2020-08-11: 650 mg via INTRAVENOUS
  Filled 2020-08-11: qty 25

## 2020-08-11 MED ORDER — ATROPINE SULFATE 1 MG/ML IJ SOLN
0.5000 mg | Freq: Once | INTRAMUSCULAR | Status: AC
  Administered 2020-08-11: 0.5 mg via INTRAVENOUS
  Filled 2020-08-11: qty 1

## 2020-08-11 MED ORDER — SODIUM CHLORIDE 0.9% FLUSH
10.0000 mL | INTRAVENOUS | Status: DC | PRN
  Administered 2020-08-11: 10 mL via INTRAVENOUS
  Filled 2020-08-11: qty 10

## 2020-08-11 MED ORDER — PALONOSETRON HCL INJECTION 0.25 MG/5ML
0.2500 mg | Freq: Once | INTRAVENOUS | Status: AC
  Administered 2020-08-11: 0.25 mg via INTRAVENOUS
  Filled 2020-08-11: qty 5

## 2020-08-11 NOTE — Progress Notes (Signed)
Patient here for follow up. No new concerns voiced.  °

## 2020-08-11 NOTE — Progress Notes (Signed)
Hematology/Oncology follow up note [facility] [phone] [phone]   Patient Care Team: Flinchum, Kelby Aline, FNP as PCP - General (Family Medicine) Flinchum, Kelby Aline, FNP (Family Medicine) [name], RN as Oncology Nurse Navigator [name], MD as Consulting Physician (Oncology)  REFERRING PROVIDER: Sharmon Leyden*  CHIEF COMPLAINTS/REASON FOR VISIT:  Follow up for treatment of pancreatic adenocarcinoma  HISTORY OF PRESENTING ILLNESS:   Cassandra [name] is a  65 y.o.  female with PMH listed below was seen in consultation at the request of  Flinchum, Michelle S, F*  for evaluation of pancreatic adenocarcinoma Patient initially presented with jaundice, transaminitis, bilirubin was 9.9.  CA 19-9 was 1874.  Patient also reports unintentional weight loss. 02/08/2020 MRI abdomen and MRCP with and without contrast was done at [facility] which showed pancreatic head mass measuring up to 3 cm, with marked associated narrowing of the portal vein confluence.  SMA is preserved.  Marked intrahepatic and extrahepatic biliary duct dilatation as well as mild dilatation of the main pancreatic duct. Multiple hepatic masses highly concerning for metastatic disease.  Patient underwent EUS on 02/19/2020, which showed irregular mass identified in the pancreatic head, hypoechoic, measured 41mmx33mm, sonographic evidence concerning for invasion into the superior mesenteric artery.  There is no sign of significant abnormality in the main pancreatic duct.  Dilatation of common bile duct which measured up to 16 mm.  Region of celiac artery was visualized and showed no signs of significant abnormality.  No lymphadenopathy.  FNA showed adenocarcinoma.  02/19/2020, ERCP, malignant.  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  The stricture was treated with placement of wall flex metal stent.  Patient was  seen by [facility] oncology Dr. Pia Mau and was recommended for 3 drug regimen FOLFIRINOX.  Patient prefers to do chemotherapy locally at [facility].  Patient was referred to establish care today. She denies any pain.  Since stent placement, skin jaundice has improved.  Itchiness has also improved. Patient was accompanied by her husband today.  She has a family history of breast cancer in sister and paternal aunt, colon cancer paternal grandmother.  #no reportable targetable mutation on NGS 9/14/2021cycle 1 FOLFIRINOX.  Patient received oxaliplatin and about 50% of Irinotecan on day 1 and had experienced neurologic symptoms.  She went to ER and working diagnosis is TIA. # 03/19/2020-03/21/2020 patient was admitted due to sepsis with strep pneumonia bacteremia.  Patient was treated with IV Rocephin.  TEE was done which showed no vegetation.  No PFO or ASD.  Patient was discharged home and he finished full course of 14 days of IV Rocephin on 04/02/2020 per ID recommendation.  Repeat blood culture was also negative.    #NGS showed no reportable targetable mutation #Genetic testing-[employer] diagnostic testing showed no pathological variants identified. She has of [facility] EGFR and NBN.  INTERVAL HISTORY Cassandra [name] is a 65 y.o. female who has above history reviewed by me today presents for evaluation prior to chemotherapy. Problems and complaints are listed below Patient reports feeling well today Patient attended her son's wedding last week.  Today she reports feeling well. Intermittent bloating and orange oily bowel movements, usually started a week after the chemotherapy. Denies any nausea vomiting diarrhea. Numbness has improved since the dose reduction of oxaliplatin.  So she did not call neurologist.  Review of Systems  Constitutional: Negative for appetite change, chills, fatigue, fever and unexpected weight change.  HENT:   Negative for hearing loss and voice change.   Eyes: Negative for eye problems.   Respiratory: Negative for chest tightness and cough.   Cardiovascular: Negative for chest pain.  Gastrointestinal: Negative for abdominal distention, abdominal pain and blood in stool.  Endocrine: Negative for hot flashes.  Genitourinary: Negative for difficulty urinating and frequency.   Musculoskeletal: Negative for arthralgias.  Skin: Negative for itching and rash.  Neurological: Positive for numbness. Negative for extremity weakness.  Hematological: Negative for adenopathy.  Psychiatric/Behavioral: Negative for confusion.    MEDICAL HISTORY:  Past Medical History:  Diagnosis Date  . Cancer [facility])    pancreatic cancer  . Colon polyps   . Family history of breast cancer   . Neutropenia ([place]) 04/18/2020  . Osteopenia after menopause 05/2017   femoral neck T score -2.0    SURGICAL HISTORY: Past Surgical History:  Procedure Laterality Date  . COLONOSCOPY  07/2015   WNL  . COLONOSCOPY  2008/2011  . OVARIAN CYST REMOVAL  1992   dermoid-Dr [place]  . PORTA CATH INSERTION N/A 03/07/2020   Procedure: PORTA CATH INSERTION;  Surgeon: Algernon Huxley, MD;  Location: [place] [facility];  Service: Cardiovascular;  Laterality: N/A;  . TEE WITHOUT CARDIOVERSION N/A 03/21/2020   Procedure: TRANSESOPHAGEAL ECHOCARDIOGRAM (TEE);  Surgeon: Dionisio David, MD;  Location: [facility] ORS;  Service: Cardiovascular;  Laterality: N/A;  . TUBAL LIGATION  1993    SOCIAL HISTORY: Social History   Socioeconomic History  . Marital status: Married    Spouse name: Not on file  . Number of children: 2  . Years of education: Not on file  . Highest education level: Not on file  Occupational History  . Occupation: Pharmacist, hospital    Comment: retired  . Occupation: Farmer  Tobacco Use  . Smoking status: Former Smoker    Packs/day: 0.50    Years: 10.00    Pack years: 5.00    Types: Cigarettes    Quit date: 1987    Years since quitting: 35.1  . Smokeless tobacco: Never Used  . Tobacco comment: Quit smoking 1987   Vaping Use  . Vaping Use: Never used  Substance and Sexual Activity  . Alcohol use: Not Currently    Comment: 0-2 mixed drinks a day  . Drug use: No  . Sexual activity: Not Currently    Birth control/protection: Post-menopausal  Other Topics Concern  . Not on file  Social History Narrative  . Not on file   Social Determinants of Health   Financial Resource Strain: Not on file  Food Insecurity: Not on file  Transportation Needs: Not on file  Physical Activity: Not on file  Stress: Not on file  Social Connections: Not on file  Intimate Partner Violence: Not on file    FAMILY HISTORY: Family History  Problem Relation Age of Onset  . Breast cancer Paternal Aunt 70  . Diabetes Mother   . Osteoporosis Mother   . Hyperlipidemia Father   . Rheumatic fever Father   . Valvular heart disease Father   . Cancer Paternal Grandmother        possible colon  . Breast cancer Sister 32    ALLERGIES:  is allergic to penicillin g.  MEDICATIONS:  Current Outpatient Medications  Medication Sig Dispense Refill  . atorvastatin (LIPITOR) 40 MG tablet Take 1 tablet (40 mg total) by mouth daily. 30 tablet 3  . Cholecalciferol 25 MCG (1000 UT) tablet Take 2,000 Units by mouth daily.    Marland Kitchen  KLOR-CON M20 20 MEQ tablet TAKE 1 TABLET BY MOUTH EVERY DAY 30 tablet 0  . lidocaine-prilocaine (EMLA) cream Apply to affected area once (Patient taking differently: Apply 1 application topically as directed.) 30 g 3  . loperamide (IMODIUM A-D) 2 MG tablet Take 2 at onset of diarrhea, then 1 every 2hrs until 12hr without a BM. May take 2 tab every 4hrs at bedtime. If diarrhea recurs repeat. (Patient not taking: No sig reported) 100 tablet 1  . magic mouthwash w/lidocaine SOLN Take 5 mLs by mouth 4 (four) times daily as needed for mouth pain. Sig: Swish & Spit 5-10 ml four times a day as needed. Dispense 480 ml. 1RF (Patient not taking: No sig reported) 480 mL 1  . ondansetron (ZOFRAN) 8 MG tablet Take 1 tablet  (8 mg total) by mouth 2 (two) times daily as needed. Start on day 3 after chemotherapy. (Patient not taking: Reported on 08/11/2020) 30 tablet 1  . prochlorperazine (COMPAZINE) 10 MG tablet Take 1 tablet (10 mg total) by mouth every 6 (six) hours as needed (Nausea or vomiting). (Patient not taking: Reported on 08/11/2020) 30 tablet 1  . simethicone (GAS-X) 80 MG chewable tablet Chew 1 tablet (80 mg total) by mouth every 8 (eight) hours as needed for flatulence. (Patient not taking: Reported on 08/11/2020) 60 tablet 0   No current facility-administered medications for this visit.   Facility-Administered Medications Ordered in Other Visits  Medication Dose Route Frequency Provider Last Rate Last Admin  . atropine injection 0.5 mg  0.5 mg Intravenous Once [name], MD      . diphenhydrAMINE (BENADRYL) capsule 25 mg  25 mg Oral Once [name], MD      . fluorouracil (ADRUCIL) 4,000 mg in sodium chloride 0.9 % 70 mL chemo infusion  2,400 mg/m2 (Order-Specific) Intravenous 1 day or 1 dose [name], MD      . heparin lock flush 100 unit/mL  500 Units Intravenous Once [name], MD      . irinotecan (CAMPTOSAR) 260 mg in sodium chloride 0.9 % 500 mL chemo infusion  150 mg/m2 (Order-Specific) Intravenous Once [name], MD      . leucovorin 650 mg in sodium chloride 0.9 % 250 mL infusion  650 mg Intravenous Once [name], MD      . sodium chloride flush (NS) 0.9 % injection 10 mL  10 mL Intravenous PRN [name], MD   10 mL at 08/11/20 0844     PHYSICAL EXAMINATION: ECOG PERFORMANCE STATUS: 1 - Symptomatic but completely ambulatory Vitals:   08/11/20 0852  BP: 128/84  Pulse: (!) 58  Resp: 18  Temp: 98.2 F (36.8 C)   Filed Weights   08/11/20 0852  Weight: 125 lb 9.6 oz (57 kg)    Physical Exam Constitutional:      General: She is not in acute distress. HENT:     Head: Normocephalic and atraumatic.  Eyes:     General: No scleral icterus. Cardiovascular:     Rate and Rhythm: Normal rate and  regular rhythm.     Heart sounds: Normal heart sounds.  Pulmonary:     Effort: Pulmonary effort is normal. No respiratory distress.     Breath sounds: No wheezing.  Abdominal:     General: Bowel sounds are normal. There is no distension.     Palpations: Abdomen is soft.  Musculoskeletal:        General: No deformity. Normal range of motion.  Cervical back: Normal range of motion and neck supple.  Skin:    General: Skin is warm and dry.     Coloration: Skin is not jaundiced.     Findings: No erythema or rash.  Neurological:     Mental Status: She is alert and oriented to person, place, and time. Mental status is at baseline.     Cranial Nerves: No cranial nerve deficit.     Coordination: Coordination normal.  Psychiatric:        Mood and Affect: Mood normal.     LABORATORY DATA:  I have reviewed the data as listed Lab Results  Component Value Date   WBC 9.9 08/11/2020   HGB 10.8 (L) 08/11/2020   HCT 31.1 (L) 08/11/2020   MCV 100.6 (H) 08/11/2020   PLT 111 (L) 08/11/2020   Recent Labs    02/06/20 1049 02/29/20 1314 03/19/20 1024 03/20/20 0411 03/21/20 0318 04/04/20 0827 07/07/20 0807 07/28/20 0810 08/11/20 0831  NA 137   < > 129* 136 140   < > 140 138 142  K 4.1   < > 3.8 4.0 3.7   < > 3.1* 4.0 3.4*  CL 99   < > 95* 104 108   < > 108 104 107  CO2 23   < > $R'25 25 25   'uy$ < > $R'25 25 24  'Mz$ GLUCOSE 113*   < > 145* 126* 114*   < > 132* 120* 129*  BUN 11   < > 11 7* 6*   < > $R'12 13 8  'Ft$ CREATININE 0.80   < > 0.85 0.66 0.52   < > 0.58 0.65 0.64  CALCIUM 10.2   < > 8.9 8.3* 8.2*   < > 8.5* 8.5* 8.6*  GFRNONAA 78   < > >60 >60 >60   < > >60 >60 >60  GFRAA 90   < > >60 >60 >60  --   --   --   --   PROT 7.0   < > 7.1  --  5.3*   < > 6.1* 6.5 6.6  ALBUMIN 4.9*   < > 3.5  --  2.5*   < > 3.9 3.8 3.9  AST 195*   < > 30  --  52*   < > 44* 49* 60*  ALT 366*   < > 43  --  74*   < > 40 52* 103*  ALKPHOS 934*   < > 125  --  112   < > 191* 222* 309*  BILITOT 9.9*   < > 1.5*  --  1.0   <  > 0.5 0.5 0.4  BILIDIR 8.11*  --   --   --   --   --   --   --   --   IBILI 1.79*  --   --   --   --   --   --   --   --    < > = values in this interval not displayed.   Iron/TIBC/Ferritin/ %Sat No results found for: IRON, TIBC, FERRITIN, IRONPCTSAT    RADIOGRAPHIC STUDIES: I have personally reviewed the radiological images as listed and agreed with the findings in the report. Reviewed findings of MRI abdomen MRCP done at [facility]. MR 3D Recon At Scanner  Result Date: 06/11/2020 CLINICAL DATA:  Follow-up pancreatic carcinoma. Status post chemotherapy. EXAM: MRI ABDOMEN WITHOUT AND WITH CONTRAST TECHNIQUE: Multiplanar multisequence MR imaging of the abdomen was performed both  before and after the administration of intravenous contrast. CONTRAST:  52mL GADAVIST GADOBUTROL 1 MMOL/ML IV SOLN COMPARISON:  None. FINDINGS: Lower chest: No acute findings. Hepatobiliary: No hepatic masses identified. Gallbladder is unremarkable. Stent is seen in the common bile duct and there is no evidence of biliary obstruction. Pancreas: Mild diffuse pancreatic atrophy and ductal dilatation is seen. Mild soft tissue fullness is seen in the pancreatic head surrounding the common bile duct stent, however there is no evidence of discrete pancreatic mass. No evidence of peripancreatic inflammatory changes or fluid collections. Spleen:  Within normal limits in size and appearance. Adrenals/Urinary Tract: No masses identified. No evidence of hydronephrosis. Stomach/Bowel: Visualized portion unremarkable. Vascular/Lymphatic: No pathologically enlarged lymph nodes identified. No abdominal aortic aneurysm. Other:  None. Musculoskeletal:  No suspicious bone lesions identified. IMPRESSION: Mild soft tissue fullness in the pancreatic head surrounding the common bile duct stent, without focal pancreatic mass. No evidence of abdominal metastatic disease or other acute findings. Electronically Signed   By: Marlaine Hind M.D.   On: 06/11/2020  14:56   MR ABDOMEN MRCP W WO CONTAST  Result Date: 06/11/2020 CLINICAL DATA:  Follow-up pancreatic carcinoma. Status post chemotherapy. EXAM: MRI ABDOMEN WITHOUT AND WITH CONTRAST TECHNIQUE: Multiplanar multisequence MR imaging of the abdomen was performed both before and after the administration of intravenous contrast. CONTRAST:  64mL GADAVIST GADOBUTROL 1 MMOL/ML IV SOLN COMPARISON:  None. FINDINGS: Lower chest: No acute findings. Hepatobiliary: No hepatic masses identified. Gallbladder is unremarkable. Stent is seen in the common bile duct and there is no evidence of biliary obstruction. Pancreas: Mild diffuse pancreatic atrophy and ductal dilatation is seen. Mild soft tissue fullness is seen in the pancreatic head surrounding the common bile duct stent, however there is no evidence of discrete pancreatic mass. No evidence of peripancreatic inflammatory changes or fluid collections. Spleen:  Within normal limits in size and appearance. Adrenals/Urinary Tract: No masses identified. No evidence of hydronephrosis. Stomach/Bowel: Visualized portion unremarkable. Vascular/Lymphatic: No pathologically enlarged lymph nodes identified. No abdominal aortic aneurysm. Other:  None. Musculoskeletal:  No suspicious bone lesions identified. IMPRESSION: Mild soft tissue fullness in the pancreatic head surrounding the common bile duct stent, without focal pancreatic mass. No evidence of abdominal metastatic disease or other acute findings. Electronically Signed   By: Marlaine Hind M.D.   On: 06/11/2020 14:56     ASSESSMENT & PLAN:  1. Primary pancreatic cancer ([place])   2. Encounter for antineoplastic chemotherapy   3. Anemia due to antineoplastic chemotherapy   4. 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 Abdominal bloating   7. 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 Responding well.  Partial response on most recent MRI abdomen Labs are reviewed and discussed with patient.  Proceed with treatment today. Proceed with FOLFIRINOX today.  Cycle 9. Continue FOLFIRINOX, finish total 12 cycles if tolerable and then switch to FOLFIRI for maintenance. Patient receives Udenyca on day 4  #Transaminitis is getting worse.  Patient is on Lipitor and I recommend patient to stop Lipitor.  Continue monitor.  Bilirubin is normal.  #Irinotecan-associated dysarthria, lip/tongue numbness stable symptoms Irinotecan to be infused over 180 minutes.  Atropine 0.5 mg once prior to the irinotecan.  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  Oxaliplatin  dose reduced to 65 mg/m. #Bloating and intermittent loose bowel movement, likely late onset of side effects of Irinotecan.  Manageable for patient right now.  Continue to monitor.  Encourage oral hydration.  #Chemotherapy-induced anemia and thrombocytopenia.   Stable.  Continue to monitor. Supportive care measures are necessary for patient well-being and will be provided as necessary. We spent sufficient time to discuss many aspect of care, questions were answered to patient's satisfaction.   All questions were answered. The patient knows to call the clinic with any problems questions or concerns.  Return of visit: 2 weeks  [name], MD, PhD Hematology Oncology [facility] Pager- [phone] 08/11/2020

## 2020-08-11 NOTE — Progress Notes (Signed)
Pt tolerated infusion well with no s/s of distress noted. Pt stable at discharge.

## 2020-08-13 ENCOUNTER — Inpatient Hospital Stay: Payer: BC Managed Care – PPO

## 2020-08-13 DIAGNOSIS — Z5111 Encounter for antineoplastic chemotherapy: Secondary | ICD-10-CM | POA: Diagnosis not present

## 2020-08-13 DIAGNOSIS — C259 Malignant neoplasm of pancreas, unspecified: Secondary | ICD-10-CM

## 2020-08-13 MED ORDER — HEPARIN SOD (PORK) LOCK FLUSH 100 UNIT/ML IV SOLN
INTRAVENOUS | Status: AC
  Filled 2020-08-13: qty 5

## 2020-08-13 MED ORDER — SODIUM CHLORIDE 0.9% FLUSH
10.0000 mL | INTRAVENOUS | Status: DC | PRN
  Administered 2020-08-13: 10 mL
  Filled 2020-08-13: qty 10

## 2020-08-13 MED ORDER — HEPARIN SOD (PORK) LOCK FLUSH 100 UNIT/ML IV SOLN
500.0000 [IU] | Freq: Once | INTRAVENOUS | Status: AC | PRN
  Administered 2020-08-13: 500 [IU]
  Filled 2020-08-13: qty 5

## 2020-08-14 ENCOUNTER — Inpatient Hospital Stay: Payer: BC Managed Care – PPO

## 2020-08-14 DIAGNOSIS — Z5111 Encounter for antineoplastic chemotherapy: Secondary | ICD-10-CM | POA: Diagnosis not present

## 2020-08-14 DIAGNOSIS — C259 Malignant neoplasm of pancreas, unspecified: Secondary | ICD-10-CM

## 2020-08-14 MED ORDER — PEGFILGRASTIM-CBQV 6 MG/0.6ML ~~LOC~~ SOSY
6.0000 mg | PREFILLED_SYRINGE | Freq: Once | SUBCUTANEOUS | Status: AC
  Administered 2020-08-14: 6 mg via SUBCUTANEOUS

## 2020-08-25 ENCOUNTER — Inpatient Hospital Stay: Payer: BC Managed Care – PPO

## 2020-08-25 ENCOUNTER — Encounter: Payer: Self-pay | Admitting: Oncology

## 2020-08-25 ENCOUNTER — Inpatient Hospital Stay (HOSPITAL_BASED_OUTPATIENT_CLINIC_OR_DEPARTMENT_OTHER): Payer: BC Managed Care – PPO | Admitting: Oncology

## 2020-08-25 ENCOUNTER — Other Ambulatory Visit: Payer: Self-pay

## 2020-08-25 VITALS — BP 136/87 | HR 60 | Temp 98.7°F | Resp 16 | Wt 124.7 lb

## 2020-08-25 DIAGNOSIS — D6481 Anemia due to antineoplastic chemotherapy: Secondary | ICD-10-CM

## 2020-08-25 DIAGNOSIS — Z5111 Encounter for antineoplastic chemotherapy: Secondary | ICD-10-CM | POA: Diagnosis not present

## 2020-08-25 DIAGNOSIS — C259 Malignant neoplasm of pancreas, unspecified: Secondary | ICD-10-CM

## 2020-08-25 DIAGNOSIS — G620 Drug-induced polyneuropathy: Secondary | ICD-10-CM

## 2020-08-25 DIAGNOSIS — D696 Thrombocytopenia, unspecified: Secondary | ICD-10-CM

## 2020-08-25 DIAGNOSIS — T451X5A Adverse effect of antineoplastic and immunosuppressive drugs, initial encounter: Secondary | ICD-10-CM

## 2020-08-25 LAB — COMPREHENSIVE METABOLIC PANEL
ALT: 41 U/L (ref 0–44)
AST: 40 U/L (ref 15–41)
Albumin: 3.9 g/dL (ref 3.5–5.0)
Alkaline Phosphatase: 229 U/L — ABNORMAL HIGH (ref 38–126)
Anion gap: 10 (ref 5–15)
BUN: 11 mg/dL (ref 8–23)
CO2: 25 mmol/L (ref 22–32)
Calcium: 8.7 mg/dL — ABNORMAL LOW (ref 8.9–10.3)
Chloride: 106 mmol/L (ref 98–111)
Creatinine, Ser: 0.61 mg/dL (ref 0.44–1.00)
GFR, Estimated: >60 mL/min (ref >60)
Glucose, Bld: 117 mg/dL — ABNORMAL HIGH (ref 70–99)
Potassium: 3.5 mmol/L (ref 3.5–5.1)
Sodium: 141 mmol/L (ref 135–145)
Total Bilirubin: 0.5 mg/dL (ref 0.3–1.2)
Total Protein: 6.2 g/dL — ABNORMAL LOW (ref 6.5–8.1)

## 2020-08-25 LAB — CBC WITH DIFFERENTIAL/PLATELET
Abs Immature Granulocytes: 0.19 10*3/uL — ABNORMAL HIGH (ref 0.00–0.07)
Basophils Absolute: 0 10*3/uL (ref 0.0–0.1)
Basophils Relative: 0 %
Eosinophils Absolute: 0.1 10*3/uL (ref 0.0–0.5)
Eosinophils Relative: 1 %
HCT: 32 % — ABNORMAL LOW (ref 36.0–46.0)
Hemoglobin: 10.8 g/dL — ABNORMAL LOW (ref 12.0–15.0)
Immature Granulocytes: 2 %
Lymphocytes Relative: 15 %
Lymphs Abs: 1.5 10*3/uL (ref 0.7–4.0)
MCH: 34.5 pg — ABNORMAL HIGH (ref 26.0–34.0)
MCHC: 33.8 g/dL (ref 30.0–36.0)
MCV: 102.2 fL — ABNORMAL HIGH (ref 80.0–100.0)
Monocytes Absolute: 0.9 10*3/uL (ref 0.1–1.0)
Monocytes Relative: 8 %
Neutro Abs: 7.5 10*3/uL (ref 1.7–7.7)
Neutrophils Relative %: 74 %
Platelets: 122 10*3/uL — ABNORMAL LOW (ref 150–400)
RBC: 3.13 MIL/uL — ABNORMAL LOW (ref 3.87–5.11)
RDW: 14.7 % (ref 11.5–15.5)
WBC: 10.3 10*3/uL (ref 4.0–10.5)
nRBC: 0 % (ref 0.0–0.2)

## 2020-08-25 MED ORDER — SODIUM CHLORIDE 0.9% FLUSH
10.0000 mL | INTRAVENOUS | Status: DC | PRN
  Administered 2020-08-25: 10 mL via INTRAVENOUS
  Filled 2020-08-25: qty 10

## 2020-08-25 MED ORDER — SODIUM CHLORIDE 0.9 % IV SOLN
150.0000 mg/m2 | Freq: Once | INTRAVENOUS | Status: AC
  Administered 2020-08-25: 260 mg via INTRAVENOUS
  Filled 2020-08-25: qty 10

## 2020-08-25 MED ORDER — SODIUM CHLORIDE 0.9 % IV SOLN
150.0000 mg | Freq: Once | INTRAVENOUS | Status: AC
  Administered 2020-08-25: 150 mg via INTRAVENOUS
  Filled 2020-08-25: qty 5

## 2020-08-25 MED ORDER — SODIUM CHLORIDE 0.9 % IV SOLN
2400.0000 mg/m2 | INTRAVENOUS | Status: DC
  Administered 2020-08-25: 4000 mg via INTRAVENOUS
  Filled 2020-08-25: qty 80

## 2020-08-25 MED ORDER — SODIUM CHLORIDE 0.9 % IV SOLN
10.0000 mg | Freq: Once | INTRAVENOUS | Status: AC
  Administered 2020-08-25: 10 mg via INTRAVENOUS
  Filled 2020-08-25: qty 10

## 2020-08-25 MED ORDER — DIPHENHYDRAMINE HCL 25 MG PO CAPS
50.0000 mg | ORAL_CAPSULE | Freq: Once | ORAL | Status: AC
  Administered 2020-08-25: 50 mg via ORAL
  Filled 2020-08-25: qty 2

## 2020-08-25 MED ORDER — SODIUM CHLORIDE 0.9 % IV SOLN
650.0000 mg | Freq: Once | INTRAVENOUS | Status: AC
Start: 2020-08-25 — End: 2020-08-25
  Administered 2020-08-25: 650 mg via INTRAVENOUS
  Filled 2020-08-25: qty 25

## 2020-08-25 MED ORDER — PALONOSETRON HCL INJECTION 0.25 MG/5ML
0.2500 mg | Freq: Once | INTRAVENOUS | Status: AC
  Administered 2020-08-25: 0.25 mg via INTRAVENOUS
  Filled 2020-08-25: qty 5

## 2020-08-25 MED ORDER — DEXTROSE 5 % IV SOLN
Freq: Once | INTRAVENOUS | Status: AC
Start: 2020-08-25 — End: 2020-08-25
  Filled 2020-08-25: qty 250

## 2020-08-25 MED ORDER — SODIUM CHLORIDE 0.9% FLUSH
10.0000 mL | INTRAVENOUS | Status: DC | PRN
  Administered 2020-08-25: 10 mL
  Filled 2020-08-25: qty 10

## 2020-08-25 MED ORDER — ATROPINE SULFATE 1 MG/ML IJ SOLN
0.5000 mg | Freq: Once | INTRAMUSCULAR | Status: AC
  Administered 2020-08-25: 0.5 mg via INTRAVENOUS
  Filled 2020-08-25: qty 1

## 2020-08-25 MED ORDER — DIPHENHYDRAMINE HCL 25 MG PO CAPS
25.0000 mg | ORAL_CAPSULE | Freq: Once | ORAL | Status: AC
  Administered 2020-08-25: 25 mg via ORAL
  Filled 2020-08-25: qty 1

## 2020-08-25 MED ORDER — DEXTROSE 5 % IV SOLN
65.0000 mg/m2 | Freq: Once | INTRAVENOUS | Status: AC
  Administered 2020-08-25: 110 mg via INTRAVENOUS
  Filled 2020-08-25: qty 20

## 2020-08-25 NOTE — Progress Notes (Signed)
Hematology/Oncology follow up note [facility] [phone] [phone]) [phone]   Patient Care Team: Flinchum, Kelby Aline, FNP as PCP - General (Family Medicine) Flinchum, Kelby Aline, FNP (Family Medicine) [name], RN as Oncology Nurse Navigator [name], MD as Consulting Physician (Oncology)  REFERRING PROVIDER: Sharmon Leyden*  CHIEF COMPLAINTS/REASON FOR VISIT:  Follow up for treatment of pancreatic adenocarcinoma  HISTORY OF PRESENTING ILLNESS:   Cassandra [name] is a  65 y.o.  female with PMH listed below was seen in consultation at the request of  Flinchum, Michelle S, F*  for evaluation of pancreatic adenocarcinoma Patient initially presented with jaundice, transaminitis, bilirubin was 9.9.  CA 19-9 was 1874.  Patient also reports unintentional weight loss. 02/08/2020 MRI abdomen and MRCP with and without contrast was done at [facility] which showed pancreatic head mass measuring up to 3 cm, with marked associated narrowing of the portal vein confluence.  SMA is preserved.  Marked intrahepatic and extrahepatic biliary duct dilatation as well as mild dilatation of the main pancreatic duct. Multiple hepatic masses highly concerning for metastatic disease.  Patient underwent EUS on 02/19/2020, which showed irregular mass identified in the pancreatic head, hypoechoic, measured 76mmx33mm, sonographic evidence concerning for invasion into the superior mesenteric artery.  There is no sign of significant abnormality in the main pancreatic duct.  Dilatation of common bile duct which measured up to 16 mm.  Region of celiac artery was visualized and showed no signs of significant abnormality.  No lymphadenopathy.  FNA showed adenocarcinoma.  02/19/2020, ERCP, malignant.  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  The stricture was treated with placement of wall flex metal stent.  Patient was  seen by [facility] oncology Dr. Pia Mau and was recommended for 3 drug regimen FOLFIRINOX.  Patient prefers to do chemotherapy locally at [facility].  Patient was referred to establish care today. She denies any pain.  Since stent placement, skin jaundice has improved.  Itchiness has also improved. Patient was accompanied by her husband today.  She has a family history of breast cancer in sister and paternal aunt, colon cancer paternal grandmother.  #no reportable targetable mutation on NGS 9/14/2021cycle 1 FOLFIRINOX.  Patient received oxaliplatin and about 50% of Irinotecan on day 1 and had experienced neurologic symptoms.  She went to ER and working diagnosis is TIA. # 03/19/2020-03/21/2020 patient was admitted due to sepsis with strep pneumonia bacteremia.  Patient was treated with IV Rocephin.  TEE was done which showed no vegetation.  No PFO or ASD.  Patient was discharged home and he finished full course of 14 days of IV Rocephin on 04/02/2020 per ID recommendation.  Repeat blood culture was also negative.    #NGS showed no reportable targetable mutation #Genetic testing-[employer] diagnostic testing showed no pathological variants identified. She has of [facility] EGFR and NBN.  INTERVAL HISTORY Cassandra [name] is a 65 y.o. female who has above history reviewed by me today presents for evaluation prior to chemotherapy. Problems and complaints are listed below Patient reports feeling well today Neuropathy symptoms are stable.  Denies any nausea vomiting diarrhea.   Review of Systems  Constitutional: Negative for appetite change, chills, fatigue, fever and unexpected weight change.  HENT:   Negative for hearing loss and voice change.   Eyes: Negative for eye problems.  Respiratory: Negative for chest tightness and cough.   Cardiovascular: Negative for chest pain.  Gastrointestinal: Negative for abdominal  distention, abdominal pain and blood in stool.  Endocrine: Negative for hot flashes.  Genitourinary:  Negative for difficulty urinating and frequency.   Musculoskeletal: Negative for arthralgias.  Skin: Negative for itching and rash.  Neurological: Positive for numbness. Negative for extremity weakness.  Hematological: Negative for adenopathy.  Psychiatric/Behavioral: Negative for confusion.    MEDICAL HISTORY:  Past Medical History:  Diagnosis Date  . Cancer [facility])    pancreatic cancer  . Colon polyps   . Family history of breast cancer   . Neutropenia ([place]) 04/18/2020  . Osteopenia after menopause 05/2017   femoral neck T score -2.0    SURGICAL HISTORY: Past Surgical History:  Procedure Laterality Date  . COLONOSCOPY  07/2015   WNL  . COLONOSCOPY  2008/2011  . OVARIAN CYST REMOVAL  1992   dermoid-Dr [place]  . PORTA CATH INSERTION N/A 03/07/2020   Procedure: PORTA CATH INSERTION;  Surgeon: Algernon Huxley, MD;  Location: [facility];  Service: Cardiovascular;  Laterality: N/A;  . TEE WITHOUT CARDIOVERSION N/A 03/21/2020   Procedure: TRANSESOPHAGEAL ECHOCARDIOGRAM (TEE);  Surgeon: Dionisio David, MD;  Location: [facility] ORS;  Service: Cardiovascular;  Laterality: N/A;  . TUBAL LIGATION  1993    SOCIAL HISTORY: Social History   Socioeconomic History  . Marital status: Married    Spouse name: Not on file  . Number of children: 2  . Years of education: Not on file  . Highest education level: Not on file  Occupational History  . Occupation: Pharmacist, hospital    Comment: retired  . Occupation: Farmer  Tobacco Use  . Smoking status: Former Smoker    Packs/day: 0.50    Years: 10.00    Pack years: 5.00    Types: Cigarettes    Quit date: 1987    Years since quitting: 35.1  . Smokeless tobacco: Never Used  . Tobacco comment: Quit smoking 1987  Vaping Use  . Vaping Use: Never used  Substance and Sexual Activity  . Alcohol use: Not Currently    Comment: 0-2 mixed drinks a day  . Drug use: No  . Sexual activity: Not Currently    Birth control/protection: Post-menopausal  Other  Topics Concern  . Not on file  Social History Narrative  . Not on file   Social Determinants of Health   Financial Resource Strain: Not on file  Food Insecurity: Not on file  Transportation Needs: Not on file  Physical Activity: Not on file  Stress: Not on file  Social Connections: Not on file  Intimate Partner Violence: Not on file    FAMILY HISTORY: Family History  Problem Relation Age of Onset  . Breast cancer Paternal Aunt 22  . Diabetes Mother   . Osteoporosis Mother   . Hyperlipidemia Father   . Rheumatic fever Father   . Valvular heart disease Father   . Cancer Paternal Grandmother        possible colon  . Breast cancer Sister 27    ALLERGIES:  is allergic to penicillin g.  MEDICATIONS:  Current Outpatient Medications  Medication Sig Dispense Refill  . atorvastatin (LIPITOR) 40 MG tablet Take 1 tablet (40 mg total) by mouth daily. 30 tablet 3  . Cholecalciferol 25 MCG (1000 UT) tablet Take 2,000 Units by mouth daily.    Marland Kitchen KLOR-CON M20 20 MEQ tablet TAKE 1 TABLET BY MOUTH EVERY DAY 30 tablet 0  . lidocaine-prilocaine (EMLA) cream Apply to affected area once (Patient taking differently: Apply 1 application topically as  directed.) 30 g 3  . loperamide (IMODIUM A-D) 2 MG tablet Take 2 at onset of diarrhea, then 1 every 2hrs until 12hr without a BM. May take 2 tab every 4hrs at bedtime. If diarrhea recurs repeat. (Patient not taking: No sig reported) 100 tablet 1  . magic mouthwash w/lidocaine SOLN Take 5 mLs by mouth 4 (four) times daily as needed for mouth pain. Sig: Swish & Spit 5-10 ml four times a day as needed. Dispense 480 ml. 1RF (Patient not taking: No sig reported) 480 mL 1  . ondansetron (ZOFRAN) 8 MG tablet Take 1 tablet (8 mg total) by mouth 2 (two) times daily as needed. Start on day 3 after chemotherapy. (Patient not taking: Reported on 08/11/2020) 30 tablet 1  . prochlorperazine (COMPAZINE) 10 MG tablet Take 1 tablet (10 mg total) by mouth every 6 (six)  hours as needed (Nausea or vomiting). (Patient not taking: Reported on 08/11/2020) 30 tablet 1  . simethicone (GAS-X) 80 MG chewable tablet Chew 1 tablet (80 mg total) by mouth every 8 (eight) hours as needed for flatulence. (Patient not taking: Reported on 08/11/2020) 60 tablet 0   No current facility-administered medications for this visit.   Facility-Administered Medications Ordered in Other Visits  Medication Dose Route Frequency Provider Last Rate Last Admin  . sodium chloride flush (NS) 0.9 % injection 10 mL  10 mL Intravenous PRN [name], MD         PHYSICAL EXAMINATION: ECOG PERFORMANCE STATUS: 1 - Symptomatic but completely ambulatory Vitals:   08/25/20 0840  BP: 136/87  Pulse: 60  Resp: 16  Temp: 98.7 F (37.1 C)   Filed Weights   08/25/20 0840  Weight: 124 lb 11.2 oz (56.6 kg)    Physical Exam Constitutional:      General: She is not in acute distress. HENT:     Head: Normocephalic and atraumatic.  Eyes:     General: No scleral icterus. Cardiovascular:     Rate and Rhythm: Normal rate and regular rhythm.     Heart sounds: Normal heart sounds.  Pulmonary:     Effort: Pulmonary effort is normal. No respiratory distress.     Breath sounds: No wheezing.  Abdominal:     General: Bowel sounds are normal. There is no distension.     Palpations: Abdomen is soft.  Musculoskeletal:        General: No deformity. Normal range of motion.     Cervical back: Normal range of motion and neck supple.  Skin:    General: Skin is warm and dry.     Coloration: Skin is not jaundiced.     Findings: No erythema or rash.  Neurological:     Mental Status: She is alert and oriented to person, place, and time. Mental status is at baseline.     Cranial Nerves: No cranial nerve deficit.     Coordination: Coordination normal.  Psychiatric:        Mood and Affect: Mood normal.     LABORATORY DATA:  I have reviewed the data as listed Lab Results  Component Value Date   WBC 9.9  08/11/2020   HGB 10.8 (L) 08/11/2020   HCT 31.1 (L) 08/11/2020   MCV 100.6 (H) 08/11/2020   PLT 111 (L) 08/11/2020   Recent Labs    02/06/20 1049 02/29/20 1314 03/19/20 1024 03/20/20 0411 03/21/20 0318 04/04/20 0827 07/07/20 0807 07/28/20 0810 08/11/20 0831  NA 137   < > 129* 136 140   < >  140 138 142  K 4.1   < > 3.8 4.0 3.7   < > 3.1* 4.0 3.4*  CL 99   < > 95* 104 108   < > 108 104 107  CO2 23   < > $R'25 25 25   'sI$ < > $R'25 25 24  'ih$ GLUCOSE 113*   < > 145* 126* 114*   < > 132* 120* 129*  BUN 11   < > 11 7* 6*   < > $R'12 13 8  'eD$ CREATININE 0.80   < > 0.85 0.66 0.52   < > 0.58 0.65 0.64  CALCIUM 10.2   < > 8.9 8.3* 8.2*   < > 8.5* 8.5* 8.6*  GFRNONAA 78   < > >60 >60 >60   < > >60 >60 >60  GFRAA 90   < > >60 >60 >60  --   --   --   --   PROT 7.0   < > 7.1  --  5.3*   < > 6.1* 6.5 6.6  ALBUMIN 4.9*   < > 3.5  --  2.5*   < > 3.9 3.8 3.9  AST 195*   < > 30  --  52*   < > 44* 49* 60*  ALT 366*   < > 43  --  74*   < > 40 52* 103*  ALKPHOS 934*   < > 125  --  112   < > 191* 222* 309*  BILITOT 9.9*   < > 1.5*  --  1.0   < > 0.5 0.5 0.4  BILIDIR 8.11*  --   --   --   --   --   --   --   --   IBILI 1.79*  --   --   --   --   --   --   --   --    < > = values in this interval not displayed.   Iron/TIBC/Ferritin/ %Sat No results found for: IRON, TIBC, FERRITIN, IRONPCTSAT    RADIOGRAPHIC STUDIES: I have personally reviewed the radiological images as listed and agreed with the findings in the report. Reviewed findings of MRI abdomen MRCP done at [facility]. MR 3D Recon At Scanner  Result Date: 06/11/2020 CLINICAL DATA:  Follow-up pancreatic carcinoma. Status post chemotherapy. EXAM: MRI ABDOMEN WITHOUT AND WITH CONTRAST TECHNIQUE: Multiplanar multisequence MR imaging of the abdomen was performed both before and after the administration of intravenous contrast. CONTRAST:  83mL GADAVIST GADOBUTROL 1 MMOL/ML IV SOLN COMPARISON:  None. FINDINGS: Lower chest: No acute findings. Hepatobiliary: No hepatic  masses identified. Gallbladder is unremarkable. Stent is seen in the common bile duct and there is no evidence of biliary obstruction. Pancreas: Mild diffuse pancreatic atrophy and ductal dilatation is seen. Mild soft tissue fullness is seen in the pancreatic head surrounding the common bile duct stent, however there is no evidence of discrete pancreatic mass. No evidence of peripancreatic inflammatory changes or fluid collections. Spleen:  Within normal limits in size and appearance. Adrenals/Urinary Tract: No masses identified. No evidence of hydronephrosis. Stomach/Bowel: Visualized portion unremarkable. Vascular/Lymphatic: No pathologically enlarged lymph nodes identified. No abdominal aortic aneurysm. Other:  None. Musculoskeletal:  No suspicious bone lesions identified. IMPRESSION: Mild soft tissue fullness in the pancreatic head surrounding the common bile duct stent, without focal pancreatic mass. No evidence of abdominal metastatic disease or other acute findings. Electronically Signed   By: Marlaine Hind M.D.   On: 06/11/2020 14:56  MR ABDOMEN MRCP W WO CONTAST  Result Date: 06/11/2020 CLINICAL DATA:  Follow-up pancreatic carcinoma. Status post chemotherapy. EXAM: MRI ABDOMEN WITHOUT AND WITH CONTRAST TECHNIQUE: Multiplanar multisequence MR imaging of the abdomen was performed both before and after the administration of intravenous contrast. CONTRAST:  30mL GADAVIST GADOBUTROL 1 MMOL/ML IV SOLN COMPARISON:  None. FINDINGS: Lower chest: No acute findings. Hepatobiliary: No hepatic masses identified. Gallbladder is unremarkable. Stent is seen in the common bile duct and there is no evidence of biliary obstruction. Pancreas: Mild diffuse pancreatic atrophy and ductal dilatation is seen. Mild soft tissue fullness is seen in the pancreatic head surrounding the common bile duct stent, however there is no evidence of discrete pancreatic mass. No evidence of peripancreatic inflammatory changes or fluid  collections. Spleen:  Within normal limits in size and appearance. Adrenals/Urinary Tract: No masses identified. No evidence of hydronephrosis. Stomach/Bowel: Visualized portion unremarkable. Vascular/Lymphatic: No pathologically enlarged lymph nodes identified. No abdominal aortic aneurysm. Other:  None. Musculoskeletal:  No suspicious bone lesions identified. IMPRESSION: Mild soft tissue fullness in the pancreatic head surrounding the common bile duct stent, without focal pancreatic mass. No evidence of abdominal metastatic disease or other acute findings. Electronically Signed   By: Marlaine Hind M.D.   On: 06/11/2020 14:56     ASSESSMENT & PLAN:  1. Primary pancreatic cancer ([place])   2. Encounter for antineoplastic chemotherapy   3. Anemia due to antineoplastic chemotherapy   4. 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 Responding well.  Partial response on most recent MRI abdomen Labs are reviewed and discussed with patient. Proceed with cycle 10 FOLFIRINOX.  Oxaliplatin has been decreased to 65 mg/m. Plan to continue FOLFIRINOX, finish total 12 cycles if tolerable and then switch to FOLFIRI for maintenance. Patient receives Udenyca on day 4  #Transaminitis, resolved after patient stopped Lipitor. #Irinotecan-associated dysarthria, lip/tongue numbness stable symptoms Irinotecan to be infused over 180 minutes.  Atropine 0.5 mg once prior to the irinotecan.  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  Oxaliplatin dose reduced to 65 mg/m. #Bloating and intermittent loose bowel movement, likely late onset of side effects of Irinotecan.  Encourage oral hydration. Symptoms  are manageable per patient. #Chemotherapy-induced anemia and thrombocytopenia.   Counts are stable.  Monitor. Supportive care measures are necessary for patient well-being and will be provided as necessary. We spent sufficient time to discuss many aspect of care, questions were answered to patient's satisfaction.   All questions were answered. The patient knows to call the clinic with any problems questions or concerns.  Return of visit: 2 weeks  [name], MD, PhD Hematology Oncology [facility] at [facility] Pager- [phone] 08/25/2020

## 2020-08-25 NOTE — Progress Notes (Signed)
Patient denies new problems/concerns today.   °

## 2020-08-26 LAB — CANCER ANTIGEN 19-9: CA 19-9: 10 U/mL (ref 0–35)

## 2020-08-27 ENCOUNTER — Inpatient Hospital Stay: Payer: Medicare Other | Attending: Oncology

## 2020-08-27 ENCOUNTER — Other Ambulatory Visit: Payer: Self-pay

## 2020-08-27 VITALS — BP 121/82 | HR 65 | Temp 98.2°F | Resp 18

## 2020-08-27 DIAGNOSIS — Z5189 Encounter for other specified aftercare: Secondary | ICD-10-CM | POA: Insufficient documentation

## 2020-08-27 DIAGNOSIS — C259 Malignant neoplasm of pancreas, unspecified: Secondary | ICD-10-CM

## 2020-08-27 DIAGNOSIS — Z5111 Encounter for antineoplastic chemotherapy: Secondary | ICD-10-CM | POA: Insufficient documentation

## 2020-08-27 DIAGNOSIS — C250 Malignant neoplasm of head of pancreas: Secondary | ICD-10-CM | POA: Insufficient documentation

## 2020-08-27 DIAGNOSIS — C787 Secondary malignant neoplasm of liver and intrahepatic bile duct: Secondary | ICD-10-CM | POA: Insufficient documentation

## 2020-08-27 MED ORDER — SODIUM CHLORIDE 0.9% FLUSH
10.0000 mL | INTRAVENOUS | Status: DC | PRN
  Administered 2020-08-27: 10 mL
  Filled 2020-08-27: qty 10

## 2020-08-27 MED ORDER — HEPARIN SOD (PORK) LOCK FLUSH 100 UNIT/ML IV SOLN
INTRAVENOUS | Status: AC
  Filled 2020-08-27: qty 5

## 2020-08-27 MED ORDER — HEPARIN SOD (PORK) LOCK FLUSH 100 UNIT/ML IV SOLN
500.0000 [IU] | Freq: Once | INTRAVENOUS | Status: AC | PRN
  Administered 2020-08-27: 500 [IU]
  Filled 2020-08-27: qty 5

## 2020-08-28 ENCOUNTER — Other Ambulatory Visit: Payer: Self-pay

## 2020-08-28 ENCOUNTER — Inpatient Hospital Stay: Payer: Medicare Other

## 2020-08-28 DIAGNOSIS — C259 Malignant neoplasm of pancreas, unspecified: Secondary | ICD-10-CM

## 2020-08-28 DIAGNOSIS — Z5111 Encounter for antineoplastic chemotherapy: Secondary | ICD-10-CM | POA: Diagnosis not present

## 2020-08-28 MED ORDER — PEGFILGRASTIM-CBQV 6 MG/0.6ML ~~LOC~~ SOSY
6.0000 mg | PREFILLED_SYRINGE | Freq: Once | SUBCUTANEOUS | Status: AC
  Administered 2020-08-28: 6 mg via SUBCUTANEOUS
  Filled 2020-08-28: qty 0.6

## 2020-09-08 ENCOUNTER — Encounter: Payer: Self-pay | Admitting: Oncology

## 2020-09-08 ENCOUNTER — Inpatient Hospital Stay (HOSPITAL_BASED_OUTPATIENT_CLINIC_OR_DEPARTMENT_OTHER): Payer: Medicare Other | Admitting: Oncology

## 2020-09-08 ENCOUNTER — Inpatient Hospital Stay: Payer: Medicare Other

## 2020-09-08 VITALS — BP 136/78 | HR 57 | Temp 98.1°F | Resp 16 | Wt 123.6 lb

## 2020-09-08 DIAGNOSIS — D696 Thrombocytopenia, unspecified: Secondary | ICD-10-CM

## 2020-09-08 DIAGNOSIS — C259 Malignant neoplasm of pancreas, unspecified: Secondary | ICD-10-CM | POA: Diagnosis not present

## 2020-09-08 DIAGNOSIS — Z5111 Encounter for antineoplastic chemotherapy: Secondary | ICD-10-CM | POA: Diagnosis not present

## 2020-09-08 DIAGNOSIS — Z95828 Presence of other vascular implants and grafts: Secondary | ICD-10-CM

## 2020-09-08 DIAGNOSIS — T451X5A Adverse effect of antineoplastic and immunosuppressive drugs, initial encounter: Secondary | ICD-10-CM

## 2020-09-08 DIAGNOSIS — G620 Drug-induced polyneuropathy: Secondary | ICD-10-CM

## 2020-09-08 DIAGNOSIS — D6481 Anemia due to antineoplastic chemotherapy: Secondary | ICD-10-CM

## 2020-09-08 LAB — COMPREHENSIVE METABOLIC PANEL
ALT: 44 U/L (ref 0–44)
AST: 43 U/L — ABNORMAL HIGH (ref 15–41)
Albumin: 3.8 g/dL (ref 3.5–5.0)
Alkaline Phosphatase: 237 U/L — ABNORMAL HIGH (ref 38–126)
Anion gap: 8 (ref 5–15)
BUN: 10 mg/dL (ref 8–23)
CO2: 26 mmol/L (ref 22–32)
Calcium: 8.6 mg/dL — ABNORMAL LOW (ref 8.9–10.3)
Chloride: 107 mmol/L (ref 98–111)
Creatinine, Ser: 0.63 mg/dL (ref 0.44–1.00)
GFR, Estimated: >60 mL/min (ref >60)
Glucose, Bld: 122 mg/dL — ABNORMAL HIGH (ref 70–99)
Potassium: 3.4 mmol/L — ABNORMAL LOW (ref 3.5–5.1)
Sodium: 141 mmol/L (ref 135–145)
Total Bilirubin: 0.4 mg/dL (ref 0.3–1.2)
Total Protein: 6.2 g/dL — ABNORMAL LOW (ref 6.5–8.1)

## 2020-09-08 LAB — CBC WITH DIFFERENTIAL/PLATELET
Abs Immature Granulocytes: 0.17 10*3/uL — ABNORMAL HIGH (ref 0.00–0.07)
Basophils Absolute: 0 10*3/uL (ref 0.0–0.1)
Basophils Relative: 0 %
Eosinophils Absolute: 0.1 10*3/uL (ref 0.0–0.5)
Eosinophils Relative: 1 %
HCT: 31.3 % — ABNORMAL LOW (ref 36.0–46.0)
Hemoglobin: 10.7 g/dL — ABNORMAL LOW (ref 12.0–15.0)
Immature Granulocytes: 2 %
Lymphocytes Relative: 15 %
Lymphs Abs: 1.4 10*3/uL (ref 0.7–4.0)
MCH: 35.3 pg — ABNORMAL HIGH (ref 26.0–34.0)
MCHC: 34.2 g/dL (ref 30.0–36.0)
MCV: 103.3 fL — ABNORMAL HIGH (ref 80.0–100.0)
Monocytes Absolute: 0.9 10*3/uL (ref 0.1–1.0)
Monocytes Relative: 10 %
Neutro Abs: 6.8 10*3/uL (ref 1.7–7.7)
Neutrophils Relative %: 72 %
Platelets: 120 10*3/uL — ABNORMAL LOW (ref 150–400)
RBC: 3.03 MIL/uL — ABNORMAL LOW (ref 3.87–5.11)
RDW: 14.4 % (ref 11.5–15.5)
WBC: 9.5 10*3/uL (ref 4.0–10.5)
nRBC: 0 % (ref 0.0–0.2)

## 2020-09-08 MED ORDER — SODIUM CHLORIDE 0.9 % IV SOLN
150.0000 mg/m2 | Freq: Once | INTRAVENOUS | Status: AC
  Administered 2020-09-08: 260 mg via INTRAVENOUS
  Filled 2020-09-08: qty 13

## 2020-09-08 MED ORDER — SODIUM CHLORIDE 0.9 % IV SOLN
INTRAVENOUS | Status: DC
  Filled 2020-09-08: qty 250

## 2020-09-08 MED ORDER — ATROPINE SULFATE 1 MG/ML IJ SOLN
0.5000 mg | Freq: Once | INTRAMUSCULAR | Status: AC
  Administered 2020-09-08: 0.5 mg via INTRAVENOUS
  Filled 2020-09-08: qty 1

## 2020-09-08 MED ORDER — SODIUM CHLORIDE 0.9 % IV SOLN
650.0000 mg | Freq: Once | INTRAVENOUS | Status: AC
  Administered 2020-09-08: 650 mg via INTRAVENOUS
  Filled 2020-09-08: qty 25

## 2020-09-08 MED ORDER — PALONOSETRON HCL INJECTION 0.25 MG/5ML
0.2500 mg | Freq: Once | INTRAVENOUS | Status: AC
  Administered 2020-09-08: 0.25 mg via INTRAVENOUS
  Filled 2020-09-08: qty 5

## 2020-09-08 MED ORDER — SODIUM CHLORIDE 0.9 % IV SOLN: 150.0000 mg/m2 | Freq: Once | INTRAVENOUS | Status: DC

## 2020-09-08 MED ORDER — SODIUM CHLORIDE 0.9 % IV SOLN
150.0000 mg | Freq: Once | INTRAVENOUS | Status: AC
  Administered 2020-09-08: 150 mg via INTRAVENOUS
  Filled 2020-09-08: qty 150

## 2020-09-08 MED ORDER — SODIUM CHLORIDE 0.9 % IV SOLN
2400.0000 mg/m2 | INTRAVENOUS | Status: DC
  Administered 2020-09-08: 4000 mg via INTRAVENOUS
  Filled 2020-09-08: qty 80

## 2020-09-08 MED ORDER — HEPARIN SOD (PORK) LOCK FLUSH 100 UNIT/ML IV SOLN
500.0000 [IU] | Freq: Once | INTRAVENOUS | Status: DC
  Filled 2020-09-08: qty 5

## 2020-09-08 MED ORDER — DIPHENHYDRAMINE HCL 25 MG PO CAPS
25.0000 mg | ORAL_CAPSULE | Freq: Once | ORAL | Status: AC
  Administered 2020-09-08: 25 mg via ORAL
  Filled 2020-09-08: qty 1

## 2020-09-08 MED ORDER — SODIUM CHLORIDE 0.9% FLUSH
10.0000 mL | Freq: Once | INTRAVENOUS | Status: AC
  Administered 2020-09-08: 10 mL via INTRAVENOUS
  Filled 2020-09-08: qty 10

## 2020-09-08 MED ORDER — SODIUM CHLORIDE 0.9 % IV SOLN
150.0000 mg/m2 | Freq: Once | INTRAVENOUS | Status: DC
  Filled 2020-09-08: qty 13

## 2020-09-08 MED ORDER — SODIUM CHLORIDE 0.9 % IV SOLN
10.0000 mg | Freq: Once | INTRAVENOUS | Status: AC
  Administered 2020-09-08: 10 mg via INTRAVENOUS
  Filled 2020-09-08: qty 10

## 2020-09-08 NOTE — Progress Notes (Signed)
Hematology/Oncology follow up note [facility] Telephone:([phone] [phone]) [phone]   Patient Care Team: Flinchum, Kelby Aline, FNP as PCP - General (Family Medicine) Flinchum, Kelby Aline, FNP (Family Medicine) [name], RN as Oncology Nurse Navigator [name], MD as Consulting Physician (Oncology)  REFERRING PROVIDER: Sharmon Leyden*  CHIEF COMPLAINTS/REASON FOR VISIT:  Follow up for treatment of pancreatic adenocarcinoma  HISTORY OF PRESENTING ILLNESS:   Cassandra [name] is a  65 y.o.  female with PMH listed below was seen in consultation at the request of  Flinchum, Michelle S, F*  for evaluation of pancreatic adenocarcinoma Patient initially presented with jaundice, transaminitis, bilirubin was 9.9.  CA 19-9 was 1874.  Patient also reports unintentional weight loss. 02/08/2020 MRI abdomen and MRCP with and without contrast was done at [facility] which showed pancreatic head mass measuring up to 3 cm, with marked associated narrowing of the portal vein confluence.  SMA is preserved.  Marked intrahepatic and extrahepatic biliary duct dilatation as well as mild dilatation of the main pancreatic duct. Multiple hepatic masses highly concerning for metastatic disease.  Patient underwent EUS on 02/19/2020, which showed irregular mass identified in the pancreatic head, hypoechoic, measured 34mx33mm, sonographic evidence concerning for invasion into the superior mesenteric artery.  There is no sign of significant abnormality in the main pancreatic duct.  Dilatation of common bile duct which measured up to 16 mm.  Region of celiac artery was visualized and showed no signs of significant abnormality.  No lymphadenopathy.  FNA showed adenocarcinoma.  02/19/2020, ERCP, malignant.  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  The stricture was treated with placement of wall flex metal stent.  [facility] ZPia Mauand was recommended for 3 drug regimen FOLFIRINOX.  [facility]  Patient was referred to establish care today. She denies any pain.  Since stent placement, skin jaundice has improved.  Itchiness has also improved. Patient was accompanied by her husband today.  She has a family history of breast cancer in sister and paternal aunt, colon cancer paternal grandmother.  #no reportable targetable mutation on NGS 9/14/2021cycle 1 FOLFIRINOX.  Patient received oxaliplatin and about 50% of Irinotecan on day 1 and had experienced neurologic symptoms.  She went to ER and working diagnosis is TIA. # 03/19/2020-03/21/2020 patient was admitted due to sepsis with strep pneumonia bacteremia.  Patient was treated with IV Rocephin.  TEE was done which showed no vegetation.  No PFO or ASD.  Patient was discharged home and he finished full course of 14 days of IV Rocephin on 04/02/2020 per ID recommendation.  Repeat blood culture was also negative.    #NGS showed no reportable targetable mutation #Genetic testing-[employer] diagnostic testing showed no pathological variants identified. She has of [facility] EGFR and NBN.  INTERVAL HISTORY Cassandra BOSSARDis a 65y.o. female who has above history reviewed by me today presents for evaluation prior to chemotherapy. Problems and complaints are listed below Patient reports feeling more tired and fatigue since last visit.  Takes longer for her to recover. She also felt nauseated after last treatment and antiemetics helps her symptoms  Neuropathy has worsened..  She follows up with neurology.  She is currently on acupuncture.   Review of Systems  Constitutional: Positive for fatigue. Negative for appetite change, chills, fever and unexpected weight change.  HENT:   Negative for  hearing loss and voice change.   Eyes: Negative for eye problems.  Respiratory: Negative for chest tightness and cough.    Cardiovascular: Negative for chest pain.  Gastrointestinal: Negative for abdominal distention, abdominal pain and blood in stool.  Endocrine: Negative for hot flashes.  Genitourinary: Negative for difficulty urinating and frequency.   Musculoskeletal: Negative for arthralgias.  Skin: Negative for itching and rash.  Neurological: Positive for numbness. Negative for extremity weakness.  Hematological: Negative for adenopathy.  Psychiatric/Behavioral: Negative for confusion.    MEDICAL HISTORY:  Past Medical History:  Diagnosis Date  . Cancer [facility])    pancreatic cancer  . Colon polyps   . Family history of breast cancer   . Neutropenia ([place]) 04/18/2020  . Osteopenia after menopause 05/2017   femoral neck T score -2.0    SURGICAL HISTORY: Past Surgical History:  Procedure Laterality Date  . COLONOSCOPY  07/2015   WNL  . COLONOSCOPY  2008/2011  . OVARIAN CYST REMOVAL  1992   dermoid-Dr [place]  . PORTA CATH INSERTION N/A 03/07/2020   Procedure: PORTA CATH INSERTION;  Surgeon: Algernon Huxley, MD;  Location: [facility];  Service: Cardiovascular;  Laterality: N/A;  . TEE WITHOUT CARDIOVERSION N/A 03/21/2020   Procedure: TRANSESOPHAGEAL ECHOCARDIOGRAM (TEE);  Surgeon: Dionisio David, MD;  Location: [facility] ORS;  Service: Cardiovascular;  Laterality: N/A;  . TUBAL LIGATION  1993    SOCIAL HISTORY: Social History   Socioeconomic History  . Marital status: Married    Spouse name: Not on file  . Number of children: 2  . Years of education: Not on file  . Highest education level: Not on file  Occupational History  . Occupation: Pharmacist, hospital    Comment: retired  . Occupation: Farmer  Tobacco Use  . Smoking status: Former Smoker    Packs/day: 0.50    Years: 10.00    Pack years: 5.00    Types: Cigarettes    Quit date: 1987    Years since quitting: 35.2  . Smokeless tobacco: Never Used  . Tobacco comment: Quit smoking 1987  Vaping Use  . Vaping Use: Never used  Substance and  Sexual Activity  . Alcohol use: Not Currently    Comment: 0-2 mixed drinks a day  . Drug use: No  . Sexual activity: Not Currently    Birth control/protection: Post-menopausal  Other Topics Concern  . Not on file  Social History Narrative  . Not on file   Social Determinants of Health   Financial Resource Strain: Not on file  Food Insecurity: Not on file  Transportation Needs: Not on file  Physical Activity: Not on file  Stress: Not on file  Social Connections: Not on file  Intimate Partner Violence: Not on file    FAMILY HISTORY: Family History  Problem Relation Age of Onset  . Breast cancer Paternal Aunt 20  . Diabetes Mother   . Osteoporosis Mother   . Hyperlipidemia Father   . Rheumatic fever Father   . Valvular heart disease Father   . Cancer Paternal Grandmother        possible colon  . Breast cancer Sister 90    ALLERGIES:  is allergic to penicillin g.  MEDICATIONS:  Current Outpatient Medications  Medication Sig Dispense Refill  . Calcium Carbonate-Vit D-Min (CALCIUM 1200 PO) Take by mouth.    . Cholecalciferol 25 MCG (1000 UT) tablet Take 2,000 Units by mouth daily.    Marland Kitchen KLOR-CON M20 20 MEQ tablet TAKE 1  TABLET BY MOUTH EVERY DAY 30 tablet 0  . lidocaine-prilocaine (EMLA) cream Apply to affected area once (Patient taking differently: Apply 1 application topically as directed.) 30 g 3  . prochlorperazine (COMPAZINE) 10 MG tablet Take 1 tablet (10 mg total) by mouth every 6 (six) hours as needed (Nausea or vomiting). 30 tablet 1  . atorvastatin (LIPITOR) 40 MG tablet Take 1 tablet (40 mg total) by mouth daily. (Patient not taking: No sig reported) 30 tablet 3  . loperamide (IMODIUM A-D) 2 MG tablet Take 2 at onset of diarrhea, then 1 every 2hrs until 12hr without a BM. May take 2 tab every 4hrs at bedtime. If diarrhea recurs repeat. (Patient not taking: No sig reported) 100 tablet 1  . magic mouthwash w/lidocaine SOLN Take 5 mLs by mouth 4 (four) times daily as  needed for mouth pain. Sig: Swish & Spit 5-10 ml four times a day as needed. Dispense 480 ml. 1RF (Patient not taking: No sig reported) 480 mL 1  . ondansetron (ZOFRAN) 8 MG tablet Take 1 tablet (8 mg total) by mouth 2 (two) times daily as needed. Start on day 3 after chemotherapy. (Patient not taking: No sig reported) 30 tablet 1  . simethicone (GAS-X) 80 MG chewable tablet Chew 1 tablet (80 mg total) by mouth every 8 (eight) hours as needed for flatulence. (Patient not taking: No sig reported) 60 tablet 0   No current facility-administered medications for this visit.   Facility-Administered Medications Ordered in Other Visits  Medication Dose Route Frequency Provider Last Rate Last Admin  . 0.9 %  sodium chloride infusion   Intravenous Continuous [name], MD   Stopped at 09/08/20 1450  . fluorouracil (ADRUCIL) 4,000 mg in sodium chloride 0.9 % 70 mL chemo infusion  2,400 mg/m2 (Order-Specific) Intravenous 1 day or 1 dose [name], MD   4,000 mg at 09/08/20 1450  . heparin lock flush 100 unit/mL  500 Units Intravenous Once [name], MD         PHYSICAL EXAMINATION: ECOG PERFORMANCE STATUS: 1 - Symptomatic but completely ambulatory Vitals:   09/08/20 0859  BP: 136/78  Pulse: (!) 57  Resp: 16  Temp: 98.1 F (36.7 C)   Filed Weights   09/08/20 0859  Weight: 123 lb 9.6 oz (56.1 kg)    Physical Exam Constitutional:      General: She is not in acute distress. HENT:     Head: Normocephalic and atraumatic.  Eyes:     General: No scleral icterus. Cardiovascular:     Rate and Rhythm: Normal rate and regular rhythm.     Heart sounds: Normal heart sounds.  Pulmonary:     Effort: Pulmonary effort is normal. No respiratory distress.     Breath sounds: No wheezing.  Abdominal:     General: Bowel sounds are normal. There is no distension.     Palpations: Abdomen is soft.  Musculoskeletal:        General: No deformity. Normal range of motion.     Cervical back: Normal range of motion  and neck supple.  Skin:    General: Skin is warm and dry.     Coloration: Skin is not jaundiced.     Findings: No erythema or rash.  Neurological:     Mental Status: She is alert and oriented to person, place, and time. Mental status is at baseline.     Cranial Nerves: No cranial nerve deficit.     Coordination: Coordination normal.  Psychiatric:  Mood and Affect: Mood normal.     LABORATORY DATA:  I have reviewed the data as listed Lab Results  Component Value Date   WBC 9.5 09/08/2020   HGB 10.7 (L) 09/08/2020   HCT 31.3 (L) 09/08/2020   MCV 103.3 (H) 09/08/2020   PLT 120 (L) 09/08/2020   Recent Labs    02/06/20 1049 02/29/20 1314 03/19/20 1024 03/20/20 0411 03/21/20 0318 04/04/20 0827 08/11/20 0831 08/25/20 0815 09/08/20 0833  NA 137   < > 129* 136 140   < > 142 141 141  K 4.1   < > 3.8 4.0 3.7   < > 3.4* 3.5 3.4*  CL 99   < > 95* 104 108   < > 107 106 107  CO2 23   < > _0 < > _1 GLUCOSE 113*   < > 145* 126* 114*   < > 129* 117* 122*  BUN 11   < > 11 7* 6*   < > _2 CREATININE 0.80   < > 0.85 0.66 0.52   < > 0.64 0.61 0.63  CALCIUM 10.2   < > 8.9 8.3* 8.2*   < > 8.6* 8.7* 8.6*  GFRNONAA 78   < > >60 >60 >60   < > >60 >60 >60  GFRAA 90   < > >60 >60 >60  --   --   --   --   PROT 7.0   < > 7.1  --  5.3*   < > 6.6 6.2* 6.2*  ALBUMIN 4.9*   < > 3.5  --  2.5*   < > 3.9 3.9 3.8  AST 195*   < > 30  --  52*   < > 60* 40 43*  ALT 366*   < > 43  --  74*   < > 103* 41 44  ALKPHOS 934*   < > 125  --  112   < > 309* 229* 237*  BILITOT 9.9*   < > 1.5*  --  1.0   < > 0.4 0.5 0.4  BILIDIR 8.11*  --   --   --   --   --   --   --   --   IBILI 1.79*  --   --   --   --   --   --   --   --    < > = values in this interval not displayed.   Iron/TIBC/Ferritin/ %Sat No results found for: IRON, TIBC, FERRITIN, IRONPCTSAT    RADIOGRAPHIC STUDIES: I have personally reviewed the radiological images as listed and agreed with the findings in the  report. Reviewed findings of MRI abdomen MRCP done at [facility]. MR 3D Recon At Scanner  Result Date: 06/11/2020 CLINICAL DATA:  Follow-up pancreatic carcinoma. Status post chemotherapy. EXAM: MRI ABDOMEN WITHOUT AND WITH CONTRAST TECHNIQUE: Multiplanar multisequence MR imaging of the abdomen was performed both before and after the administration of intravenous contrast. CONTRAST:  73m GADAVIST GADOBUTROL 1 MMOL/ML IV SOLN COMPARISON:  None. FINDINGS: Lower chest: No acute findings. Hepatobiliary: No hepatic masses identified. Gallbladder is unremarkable. Stent is seen in the common bile duct and there is no evidence of biliary obstruction. Pancreas: Mild diffuse pancreatic atrophy and ductal dilatation is seen. Mild soft tissue fullness is seen in the pancreatic head surrounding the common bile duct stent, however there is no evidence of discrete pancreatic mass. No evidence  of peripancreatic inflammatory changes or fluid collections. Spleen:  Within normal limits in size and appearance. Adrenals/Urinary Tract: No masses identified. No evidence of hydronephrosis. Stomach/Bowel: Visualized portion unremarkable. Vascular/Lymphatic: No pathologically enlarged lymph nodes identified. No abdominal aortic aneurysm. Other:  None. Musculoskeletal:  No suspicious bone lesions identified. IMPRESSION: Mild soft tissue fullness in the pancreatic head surrounding the common bile duct stent, without focal pancreatic mass. No evidence of abdominal metastatic disease or other acute findings. Electronically Signed   By: Marlaine Hind M.D.   On: 06/11/2020 14:56   MR ABDOMEN MRCP W WO CONTAST  Result Date: 06/11/2020 CLINICAL DATA:  Follow-up pancreatic carcinoma. Status post chemotherapy. EXAM: MRI ABDOMEN WITHOUT AND WITH CONTRAST TECHNIQUE: Multiplanar multisequence MR imaging of the abdomen was performed both before and after the administration of intravenous contrast. CONTRAST:  36m GADAVIST GADOBUTROL 1 MMOL/ML IV SOLN  COMPARISON:  None. FINDINGS: Lower chest: No acute findings. Hepatobiliary: No hepatic masses identified. Gallbladder is unremarkable. Stent is seen in the common bile duct and there is no evidence of biliary obstruction. Pancreas: Mild diffuse pancreatic atrophy and ductal dilatation is seen. Mild soft tissue fullness is seen in the pancreatic head surrounding the common bile duct stent, however there is no evidence of discrete pancreatic mass. No evidence of peripancreatic inflammatory changes or fluid collections. Spleen:  Within normal limits in size and appearance. Adrenals/Urinary Tract: No masses identified. No evidence of hydronephrosis. Stomach/Bowel: Visualized portion unremarkable. Vascular/Lymphatic: No pathologically enlarged lymph nodes identified. No abdominal aortic aneurysm. Other:  None. Musculoskeletal:  No suspicious bone lesions identified. IMPRESSION: Mild soft tissue fullness in the pancreatic head surrounding the common bile duct stent, without focal pancreatic mass. No evidence of abdominal metastatic disease or other acute findings. Electronically Signed   By: JMarlaine HindM.D.   On: 06/11/2020 14:56     ASSESSMENT & PLAN:  1. Primary pancreatic cancer ([place]   2. Encounter for antineoplastic chemotherapy   3. Anemia due to antineoplastic chemotherapy   4. Thrombocytopenia ([place]   5. 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 Responding well.  Partial response on most recent MRI abdomen Labs reviewed and discussed with patient. Proceed with cycle 11 chemotherapy.  Discontinue oxaliplatin due to worsening of neuropathy and the patient's preference.Patient receives Udenyca on day 4  #Transaminitis, stable.  Off  Lipitor. #Irinotecan-associated dysarthria, lip/tongue numbness stable symptoms Irinotecan to be infused over 180 minutes.  Atropine 0.5 mg once prior to the irinotecan.  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   Patient follows up with neurology for management..  Continue acupuncture.  #Chemotherapy-induced anemia and thrombocytopenia.   Counts are stable. Supportive care measures are necessary for patient well-being and will be provided as necessary. We spent sufficient time to discuss many aspect of care, questions were answered to patient's satisfaction.   All questions were answered. The patient knows to call the clinic with any problems questions or concerns.  Return of visit: 2 weeks  ZEarlie Server MD, PhD Hematology Oncology [facility]at A[facility] For ChildrenPager- [phone]/14/2022

## 2020-09-08 NOTE — Progress Notes (Signed)
Patient had an increase in nausea as well as neuropathy after the last treatment.

## 2020-09-10 ENCOUNTER — Inpatient Hospital Stay: Payer: Medicare Other

## 2020-09-10 DIAGNOSIS — C259 Malignant neoplasm of pancreas, unspecified: Secondary | ICD-10-CM

## 2020-09-10 DIAGNOSIS — Z5111 Encounter for antineoplastic chemotherapy: Secondary | ICD-10-CM | POA: Diagnosis not present

## 2020-09-10 MED ORDER — HEPARIN SOD (PORK) LOCK FLUSH 100 UNIT/ML IV SOLN
INTRAVENOUS | Status: AC
  Filled 2020-09-10: qty 5

## 2020-09-10 MED ORDER — HEPARIN SOD (PORK) LOCK FLUSH 100 UNIT/ML IV SOLN
500.0000 [IU] | Freq: Once | INTRAVENOUS | Status: AC | PRN
  Administered 2020-09-10: 500 [IU]
  Filled 2020-09-10: qty 5

## 2020-09-10 MED ORDER — SODIUM CHLORIDE 0.9% FLUSH
10.0000 mL | INTRAVENOUS | Status: DC | PRN
  Administered 2020-09-10: 10 mL
  Filled 2020-09-10: qty 10

## 2020-09-11 ENCOUNTER — Inpatient Hospital Stay: Payer: Medicare Other

## 2020-09-11 ENCOUNTER — Other Ambulatory Visit: Payer: Self-pay

## 2020-09-11 DIAGNOSIS — Z5111 Encounter for antineoplastic chemotherapy: Secondary | ICD-10-CM | POA: Diagnosis not present

## 2020-09-11 DIAGNOSIS — C259 Malignant neoplasm of pancreas, unspecified: Secondary | ICD-10-CM

## 2020-09-11 MED ORDER — PEGFILGRASTIM-CBQV 6 MG/0.6ML ~~LOC~~ SOSY
6.0000 mg | PREFILLED_SYRINGE | Freq: Once | SUBCUTANEOUS | Status: AC
  Administered 2020-09-11: 6 mg via SUBCUTANEOUS
  Filled 2020-09-11: qty 0.6

## 2020-09-19 ENCOUNTER — Other Ambulatory Visit: Payer: Self-pay | Admitting: *Deleted

## 2020-09-19 MED ORDER — POTASSIUM CHLORIDE CRYS ER 20 MEQ PO TBCR: 20.0000 meq | EXTENDED_RELEASE_TABLET | Freq: Every day | ORAL | 0 refills | Status: DC

## 2020-09-19 NOTE — Telephone Encounter (Signed)
  Component Ref Range & Units 11 d ago  (09/08/20) 3 wk ago  (08/25/20) 1 mo ago  (08/11/20) 1 mo ago  (07/28/20) 2 mo ago  (07/07/20) 2 mo ago  (06/23/20) 3 mo ago  (06/03/20)  Potassium 3.5 - 5.1 mmol/L 3.4Low  3.5  3.4Low  4.0

## 2020-09-22 ENCOUNTER — Inpatient Hospital Stay: Payer: Medicare Other

## 2020-09-22 ENCOUNTER — Encounter: Payer: Self-pay | Admitting: Oncology

## 2020-09-22 ENCOUNTER — Other Ambulatory Visit: Payer: Self-pay

## 2020-09-22 ENCOUNTER — Inpatient Hospital Stay (HOSPITAL_BASED_OUTPATIENT_CLINIC_OR_DEPARTMENT_OTHER): Payer: Medicare Other | Admitting: Oncology

## 2020-09-22 VITALS — BP 124/76 | HR 58 | Temp 98.7°F | Resp 18 | Wt 122.2 lb

## 2020-09-22 DIAGNOSIS — C259 Malignant neoplasm of pancreas, unspecified: Secondary | ICD-10-CM

## 2020-09-22 DIAGNOSIS — T451X5A Adverse effect of antineoplastic and immunosuppressive drugs, initial encounter: Secondary | ICD-10-CM

## 2020-09-22 DIAGNOSIS — D696 Thrombocytopenia, unspecified: Secondary | ICD-10-CM | POA: Diagnosis not present

## 2020-09-22 DIAGNOSIS — Z5111 Encounter for antineoplastic chemotherapy: Secondary | ICD-10-CM | POA: Diagnosis not present

## 2020-09-22 DIAGNOSIS — D6481 Anemia due to antineoplastic chemotherapy: Secondary | ICD-10-CM

## 2020-09-22 DIAGNOSIS — G620 Drug-induced polyneuropathy: Secondary | ICD-10-CM

## 2020-09-22 LAB — COMPREHENSIVE METABOLIC PANEL
ALT: 48 U/L — ABNORMAL HIGH (ref 0–44)
AST: 49 U/L — ABNORMAL HIGH (ref 15–41)
Albumin: 3.8 g/dL (ref 3.5–5.0)
Alkaline Phosphatase: 231 U/L — ABNORMAL HIGH (ref 38–126)
Anion gap: 9 (ref 5–15)
BUN: 10 mg/dL (ref 8–23)
CO2: 25 mmol/L (ref 22–32)
Calcium: 8.6 mg/dL — ABNORMAL LOW (ref 8.9–10.3)
Chloride: 109 mmol/L (ref 98–111)
Creatinine, Ser: 0.67 mg/dL (ref 0.44–1.00)
GFR, Estimated: >60 mL/min (ref >60)
Glucose, Bld: 112 mg/dL — ABNORMAL HIGH (ref 70–99)
Potassium: 3.6 mmol/L (ref 3.5–5.1)
Sodium: 143 mmol/L (ref 135–145)
Total Bilirubin: 0.4 mg/dL (ref 0.3–1.2)
Total Protein: 5.9 g/dL — ABNORMAL LOW (ref 6.5–8.1)

## 2020-09-22 LAB — CBC WITH DIFFERENTIAL/PLATELET
Abs Immature Granulocytes: 0.59 10*3/uL — ABNORMAL HIGH (ref 0.00–0.07)
Basophils Absolute: 0.1 10*3/uL (ref 0.0–0.1)
Basophils Relative: 0 %
Eosinophils Absolute: 0.1 10*3/uL (ref 0.0–0.5)
Eosinophils Relative: 1 %
HCT: 32.5 % — ABNORMAL LOW (ref 36.0–46.0)
Hemoglobin: 11.1 g/dL — ABNORMAL LOW (ref 12.0–15.0)
Immature Granulocytes: 4 %
Lymphocytes Relative: 13 %
Lymphs Abs: 1.8 10*3/uL (ref 0.7–4.0)
MCH: 35.9 pg — ABNORMAL HIGH (ref 26.0–34.0)
MCHC: 34.2 g/dL (ref 30.0–36.0)
MCV: 105.2 fL — ABNORMAL HIGH (ref 80.0–100.0)
Monocytes Absolute: 1 10*3/uL (ref 0.1–1.0)
Monocytes Relative: 7 %
Neutro Abs: 10 10*3/uL — ABNORMAL HIGH (ref 1.7–7.7)
Neutrophils Relative %: 75 %
Platelets: 160 10*3/uL (ref 150–400)
RBC: 3.09 MIL/uL — ABNORMAL LOW (ref 3.87–5.11)
RDW: 14.5 % (ref 11.5–15.5)
WBC: 13.5 10*3/uL — ABNORMAL HIGH (ref 4.0–10.5)
nRBC: 0.1 % (ref 0.0–0.2)

## 2020-09-22 MED ORDER — DIPHENHYDRAMINE HCL 25 MG PO CAPS: 25.0000 mg | ORAL_CAPSULE | Freq: Once | ORAL | Status: DC

## 2020-09-22 MED ORDER — DIPHENHYDRAMINE HCL 25 MG PO CAPS
25.0000 mg | ORAL_CAPSULE | Freq: Once | ORAL | Status: AC
  Administered 2020-09-22: 25 mg via ORAL
  Filled 2020-09-22: qty 1

## 2020-09-22 MED ORDER — SODIUM CHLORIDE 0.9 % IV SOLN
650.0000 mg | Freq: Once | INTRAVENOUS | Status: AC
  Administered 2020-09-22: 650 mg via INTRAVENOUS
  Filled 2020-09-22: qty 32.5

## 2020-09-22 MED ORDER — PALONOSETRON HCL INJECTION 0.25 MG/5ML
0.2500 mg | Freq: Once | INTRAVENOUS | Status: AC
  Administered 2020-09-22: 0.25 mg via INTRAVENOUS
  Filled 2020-09-22: qty 5

## 2020-09-22 MED ORDER — SODIUM CHLORIDE 0.9% FLUSH
10.0000 mL | INTRAVENOUS | Status: DC | PRN
Start: 2020-09-22 — End: 2020-09-22
  Filled 2020-09-22: qty 10

## 2020-09-22 MED ORDER — SODIUM CHLORIDE 0.9 % IV SOLN
10.0000 mg | Freq: Once | INTRAVENOUS | Status: AC
  Administered 2020-09-22: 10 mg via INTRAVENOUS
  Filled 2020-09-22: qty 10

## 2020-09-22 MED ORDER — ATROPINE SULFATE 1 MG/ML IJ SOLN
0.5000 mg | Freq: Once | INTRAMUSCULAR | Status: AC
  Administered 2020-09-22: 0.5 mg via INTRAVENOUS
  Filled 2020-09-22: qty 1

## 2020-09-22 MED ORDER — SODIUM CHLORIDE 0.9 % IV SOLN
150.0000 mg/m2 | Freq: Once | INTRAVENOUS | Status: AC
  Administered 2020-09-22: 260 mg via INTRAVENOUS
  Filled 2020-09-22: qty 10

## 2020-09-22 MED ORDER — DEXTROSE 5 % IV SOLN
Freq: Once | INTRAVENOUS | Status: AC
  Filled 2020-09-22: qty 250

## 2020-09-22 MED ORDER — SODIUM CHLORIDE 0.9 % IV SOLN
150.0000 mg | Freq: Once | INTRAVENOUS | Status: AC
  Administered 2020-09-22: 150 mg via INTRAVENOUS
  Filled 2020-09-22: qty 150

## 2020-09-22 MED ORDER — SODIUM CHLORIDE 0.9 % IV SOLN
2400.0000 mg/m2 | INTRAVENOUS | Status: DC
  Administered 2020-09-22: 4000 mg via INTRAVENOUS
  Filled 2020-09-22: qty 80

## 2020-09-22 NOTE — Progress Notes (Signed)
Patient here for follow up. No new concerns voiced.  °

## 2020-09-22 NOTE — Progress Notes (Signed)
Hematology/Oncology follow up note [facility] [phone] [phone]) [phone]   Patient Care Team: Flinchum, Kelby Aline, FNP as PCP - General (Family Medicine) Flinchum, Kelby Aline, FNP (Family Medicine) [name], RN as Oncology Nurse Navigator [name], MD as Consulting Physician (Oncology)  REFERRING PROVIDER: Sharmon Leyden*  CHIEF COMPLAINTS/REASON FOR VISIT:  Follow up for treatment of pancreatic adenocarcinoma  HISTORY OF PRESENTING ILLNESS:   Cassandra [name] is a  65 y.o.  female with PMH listed below was seen in consultation at the request of  Flinchum, Michelle S, F*  for evaluation of pancreatic adenocarcinoma Patient initially presented with jaundice, transaminitis, bilirubin was 9.9.  CA 19-9 was 1874.  Patient also reports unintentional weight loss. 02/08/2020 MRI abdomen and MRCP with and without contrast was done at [facility] which showed pancreatic head mass measuring up to 3 cm, with marked associated narrowing of the portal vein confluence.  SMA is preserved.  Marked intrahepatic and extrahepatic biliary duct dilatation as well as mild dilatation of the main pancreatic duct. Multiple hepatic masses highly concerning for metastatic disease.  Patient underwent EUS on 02/19/2020, which showed irregular mass identified in the pancreatic head, hypoechoic, measured 1mmx33mm, sonographic evidence concerning for invasion into the superior mesenteric artery.  There is no sign of significant abnormality in the main pancreatic duct.  Dilatation of common bile duct which measured up to 16 mm.  Region of celiac artery was visualized and showed no signs of significant abnormality.  No lymphadenopathy.  FNA showed adenocarcinoma.  02/19/2020, ERCP, malignant.  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  The stricture was treated with placement of wall flex metal stent.  Patient was  seen by [facility] oncology Dr. Pia Mau and was recommended for 3 drug regimen FOLFIRINOX.  Patient prefers to do chemotherapy locally at [facility].  Patient was referred to establish care today. She denies any pain.  Since stent placement, skin jaundice has improved.  Itchiness has also improved. Patient was accompanied by her husband today.  She has a family history of breast cancer in sister and paternal aunt, colon cancer paternal grandmother.  #no reportable targetable mutation on NGS 9/14/2021cycle 1 FOLFIRINOX.  Patient received oxaliplatin and about 50% of Irinotecan on day 1 and had experienced neurologic symptoms.  She went to ER and working diagnosis is TIA. # 03/19/2020-03/21/2020 patient was admitted [name] to sepsis with strep pneumonia bacteremia.  Patient was treated with IV Rocephin.  TEE was done which showed no vegetation.  No PFO or ASD.  Patient was discharged home and he finished full course of 14 days of IV Rocephin on 04/02/2020 per ID recommendation.  Repeat blood culture was also negative.  08/25/20 cycle 10 FOLFIRINOX 09/08/20 Cycle 11, FOLFIRI, Oxaliplatin discontinued [name] to neuropathy   #NGS showed no reportable targetable mutation #Genetic testing-[employer] diagnostic testing showed no pathological variants identified. MS stable, TMB 27mut/mb, KRAS G12D, SF3B1 K700E, TP53 V240fs*30  INTERVAL HISTORY Cassandra [name] is a 65 y.o. female who has above history reviewed by me today presents for evaluation prior to chemotherapy. Problems and complaints are listed below Patient feels that chemotherapy is more tolerable after oxaliplatin has been discontinued. No fever chills nausea vomiting diarrhea. Neuropathy is slightly better.  She follows up with neurology and has upcoming appointment    Review of Systems  Constitutional: Positive for fatigue. Negative for appetite change, chills, fever and unexpected weight change.  HENT:  Negative for hearing loss and voice change.   Eyes: Negative  for eye problems.  Respiratory: Negative for chest tightness and cough.   Cardiovascular: Negative for chest pain.  Gastrointestinal: Negative for abdominal distention, abdominal pain and blood in stool.  Endocrine: Negative for hot flashes.  Genitourinary: Negative for difficulty urinating and frequency.   Musculoskeletal: Negative for arthralgias.  Skin: Negative for itching and rash.  Neurological: Positive for numbness. Negative for extremity weakness.  Hematological: Negative for adenopathy.  Psychiatric/Behavioral: Negative for confusion.    MEDICAL HISTORY:  Past Medical History:  Diagnosis Date  . Cancer [facility])    pancreatic cancer  . Colon polyps   . Family history of breast cancer   . Neutropenia ([place]) 04/18/2020  . Osteopenia after menopause 05/2017   femoral neck T score -2.0    SURGICAL HISTORY: Past Surgical History:  Procedure Laterality Date  . COLONOSCOPY  07/2015   WNL  . COLONOSCOPY  2008/2011  . OVARIAN CYST REMOVAL  1992   dermoid-Dr [place]  . PORTA CATH INSERTION N/A 03/07/2020   Procedure: PORTA CATH INSERTION;  Surgeon: Algernon Huxley, MD;  Location: [facility];  Service: Cardiovascular;  Laterality: N/A;  . TEE WITHOUT CARDIOVERSION N/A 03/21/2020   Procedure: TRANSESOPHAGEAL ECHOCARDIOGRAM (TEE);  Surgeon: Dionisio David, MD;  Location: [facility] ORS;  Service: Cardiovascular;  Laterality: N/A;  . TUBAL LIGATION  1993    SOCIAL HISTORY: Social History   Socioeconomic History  . Marital status: Married    Spouse name: Not on file  . Number of children: 2  . Years of education: Not on file  . Highest education level: Not on file  Occupational History  . Occupation: Pharmacist, hospital    Comment: retired  . Occupation: Farmer  Tobacco Use  . Smoking status: Former Smoker    Packs/day: 0.50    Years: 10.00    Pack years: 5.00    Types: Cigarettes    Quit date: 1987    Years since quitting: 35.2  . Smokeless tobacco: Never Used  . Tobacco comment:  Quit smoking 1987  Vaping Use  . Vaping Use: Never used  Substance and Sexual Activity  . Alcohol use: Not Currently    Comment: 0-2 mixed drinks a day  . Drug use: No  . Sexual activity: Not Currently    Birth control/protection: Post-menopausal  Other Topics Concern  . Not on file  Social History Narrative  . Not on file   Social Determinants of Health   Financial Resource Strain: Not on file  Food Insecurity: Not on file  Transportation Needs: Not on file  Physical Activity: Not on file  Stress: Not on file  Social Connections: Not on file  Intimate Partner Violence: Not on file    FAMILY HISTORY: Family History  Problem Relation Age of Onset  . Breast cancer Paternal Aunt 37  . Diabetes Mother   . Osteoporosis Mother   . Hyperlipidemia Father   . Rheumatic fever Father   . Valvular heart disease Father   . Cancer Paternal Grandmother        possible colon  . Breast cancer Sister 3    ALLERGIES:  is allergic to penicillin g.  MEDICATIONS:  Current Outpatient Medications  Medication Sig Dispense Refill  . atorvastatin (LIPITOR) 40 MG tablet Take 1 tablet (40 mg total) by mouth daily. (Patient not taking: No sig reported) 30 tablet 3  . Calcium Carbonate-Vit D-Min (CALCIUM 1200 PO) Take by mouth.    Marland Kitchen  Cholecalciferol 25 MCG (1000 UT) tablet Take 2,000 Units by mouth daily.    Marland Kitchen lidocaine-prilocaine (EMLA) cream Apply to affected area once (Patient taking differently: Apply 1 application topically as directed.) 30 g 3  . loperamide (IMODIUM A-D) 2 MG tablet Take 2 at onset of diarrhea, then 1 every 2hrs until 12hr without a BM. May take 2 tab every 4hrs at bedtime. If diarrhea recurs repeat. (Patient not taking: No sig reported) 100 tablet 1  . magic mouthwash w/lidocaine SOLN Take 5 mLs by mouth 4 (four) times daily as needed for mouth pain. Sig: Swish & Spit 5-10 ml four times a day as needed. Dispense 480 ml. 1RF (Patient not taking: No sig reported) 480 mL 1  .  ondansetron (ZOFRAN) 8 MG tablet Take 1 tablet (8 mg total) by mouth 2 (two) times daily as needed. Start on day 3 after chemotherapy. (Patient not taking: No sig reported) 30 tablet 1  . potassium chloride SA (KLOR-CON M20) 20 MEQ tablet Take 1 tablet (20 mEq total) by mouth daily. 30 tablet 0  . prochlorperazine (COMPAZINE) 10 MG tablet Take 1 tablet (10 mg total) by mouth every 6 (six) hours as needed (Nausea or vomiting). 30 tablet 1  . simethicone (GAS-X) 80 MG chewable tablet Chew 1 tablet (80 mg total) by mouth every 8 (eight) hours as needed for flatulence. (Patient not taking: No sig reported) 60 tablet 0   No current facility-administered medications for this visit.     PHYSICAL EXAMINATION: ECOG PERFORMANCE STATUS: 1 - Symptomatic but completely ambulatory Vitals:   09/22/20 0846  BP: 124/76  Pulse: (!) 58  Resp: 18  Temp: 98.7 F (37.1 C)   Filed Weights   09/22/20 0846  Weight: 122 lb 3.2 oz (55.4 kg)    Physical Exam Constitutional:      General: She is not in acute distress. HENT:     Head: Normocephalic and atraumatic.  Eyes:     General: No scleral icterus. Cardiovascular:     Rate and Rhythm: Normal rate and regular rhythm.     Heart sounds: Normal heart sounds.  Pulmonary:     Effort: Pulmonary effort is normal. No respiratory distress.     Breath sounds: No wheezing.  Abdominal:     General: Bowel sounds are normal. There is no distension.     Palpations: Abdomen is soft.  Musculoskeletal:        General: No deformity. Normal range of motion.     Cervical back: Normal range of motion and neck supple.  Skin:    General: Skin is warm and dry.     Coloration: Skin is not jaundiced.     Findings: No erythema or rash.  Neurological:     Mental Status: She is alert and oriented to person, place, and time. Mental status is at baseline.     Cranial Nerves: No cranial nerve deficit.     Coordination: Coordination normal.  Psychiatric:        Mood and  Affect: Mood normal.     LABORATORY DATA:  I have reviewed the data as listed Lab Results  Component Value Date   WBC 9.5 09/08/2020   HGB 10.7 (L) 09/08/2020   HCT 31.3 (L) 09/08/2020   MCV 103.3 (H) 09/08/2020   PLT 120 (L) 09/08/2020   Recent Labs    02/06/20 1049 02/29/20 1314 03/19/20 1024 03/20/20 0411 03/21/20 0318 04/04/20 0827 08/11/20 0831 08/25/20 0815 09/08/20 0833  NA 137   < >  129* 136 140   < > 142 141 141  K 4.1   < > 3.8 4.0 3.7   < > 3.4* 3.5 3.4*  CL 99   < > 95* 104 108   < > 107 106 107  CO2 23   < > $R'25 25 25   'VH$ < > $R'24 25 26  'Ys$ GLUCOSE 113*   < > 145* 126* 114*   < > 129* 117* 122*  BUN 11   < > 11 7* 6*   < > $R'8 11 10  'gN$ CREATININE 0.80   < > 0.85 0.66 0.52   < > 0.64 0.61 0.63  CALCIUM 10.2   < > 8.9 8.3* 8.2*   < > 8.6* 8.7* 8.6*  GFRNONAA 78   < > >60 >60 >60   < > >60 >60 >60  GFRAA 90   < > >60 >60 >60  --   --   --   --   PROT 7.0   < > 7.1  --  5.3*   < > 6.6 6.2* 6.2*  ALBUMIN 4.9*   < > 3.5  --  2.5*   < > 3.9 3.9 3.8  AST 195*   < > 30  --  52*   < > 60* 40 43*  ALT 366*   < > 43  --  74*   < > 103* 41 44  ALKPHOS 934*   < > 125  --  112   < > 309* 229* 237*  BILITOT 9.9*   < > 1.5*  --  1.0   < > 0.4 0.5 0.4  BILIDIR 8.11*  --   --   --   --   --   --   --   --   IBILI 1.79*  --   --   --   --   --   --   --   --    < > = values in this interval not displayed.   Iron/TIBC/Ferritin/ %Sat No results found for: IRON, TIBC, FERRITIN, IRONPCTSAT    RADIOGRAPHIC STUDIES: I have personally reviewed the radiological images as listed and agreed with the findings in the report. Reviewed findings of MRI abdomen MRCP done at [facility]. No results found.   ASSESSMENT & PLAN:  1. Primary pancreatic cancer ([place])   2. Encounter for antineoplastic chemotherapy   3. Anemia [name] to antineoplastic chemotherapy   4. Thrombocytopenia ([place])   5. 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  No discrete mass detected on most recent MRI abdomen Labs are reviewed and discussed with patient. Continue cycle 12 treatment with FOLFIRI. Hold off Udenyca for now.  Repeat CBC in 1 week  #Transaminitis,   Off Lipitor. #Irinotecan-associated dysarthria, lip/tongue numbness stable symptoms Irinotecan to be infused over 180 minutes.  Atropine 0.5 mg once prior to the irinotecan.  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   Patient follows up with neurology for management..  Continue acupuncture.  #Chemotherapy-induced anemia and thrombocytopenia.   Counts are stable. Supportive care measures are necessary for patient well-being and will be provided as necessary. We spent sufficient time to discuss many aspect of care, questions were answered to patient's satisfaction.  All questions were answered. The patient knows to call the clinic with any problems questions or concerns.  Return of visit: 2 weeks  [name], MD, PhD Hematology Oncology [facility] at [facility] Pager- [phone] 09/22/2020

## 2020-09-23 ENCOUNTER — Other Ambulatory Visit: Payer: Self-pay

## 2020-09-23 DIAGNOSIS — C259 Malignant neoplasm of pancreas, unspecified: Secondary | ICD-10-CM

## 2020-09-23 LAB — CANCER ANTIGEN 19-9: CA 19-9: 9 U/mL (ref 0–35)

## 2020-09-24 ENCOUNTER — Other Ambulatory Visit: Payer: Self-pay

## 2020-09-24 ENCOUNTER — Inpatient Hospital Stay: Payer: Medicare Other

## 2020-09-24 DIAGNOSIS — C259 Malignant neoplasm of pancreas, unspecified: Secondary | ICD-10-CM

## 2020-09-24 DIAGNOSIS — Z5111 Encounter for antineoplastic chemotherapy: Secondary | ICD-10-CM | POA: Diagnosis not present

## 2020-09-24 MED ORDER — SODIUM CHLORIDE 0.9% FLUSH
10.0000 mL | INTRAVENOUS | Status: DC | PRN
Start: 2020-09-24 — End: 2020-09-24
  Administered 2020-09-24: 10 mL
  Filled 2020-09-24: qty 10

## 2020-09-24 MED ORDER — HEPARIN SOD (PORK) LOCK FLUSH 100 UNIT/ML IV SOLN
INTRAVENOUS | Status: AC
  Filled 2020-09-24: qty 5

## 2020-09-24 MED ORDER — HEPARIN SOD (PORK) LOCK FLUSH 100 UNIT/ML IV SOLN
500.0000 [IU] | Freq: Once | INTRAVENOUS | Status: AC | PRN
  Administered 2020-09-24: 500 [IU]
  Filled 2020-09-24: qty 5

## 2020-09-25 ENCOUNTER — Inpatient Hospital Stay: Payer: Medicare Other

## 2020-09-26 ENCOUNTER — Other Ambulatory Visit: Payer: Self-pay

## 2020-09-26 DIAGNOSIS — C259 Malignant neoplasm of pancreas, unspecified: Secondary | ICD-10-CM

## 2020-09-29 ENCOUNTER — Inpatient Hospital Stay: Payer: Medicare Other | Attending: Oncology

## 2020-09-29 DIAGNOSIS — C787 Secondary malignant neoplasm of liver and intrahepatic bile duct: Secondary | ICD-10-CM | POA: Insufficient documentation

## 2020-09-29 DIAGNOSIS — Z5111 Encounter for antineoplastic chemotherapy: Secondary | ICD-10-CM | POA: Diagnosis not present

## 2020-09-29 DIAGNOSIS — C259 Malignant neoplasm of pancreas, unspecified: Secondary | ICD-10-CM

## 2020-09-29 DIAGNOSIS — Z452 Encounter for adjustment and management of vascular access device: Secondary | ICD-10-CM | POA: Insufficient documentation

## 2020-09-29 DIAGNOSIS — C250 Malignant neoplasm of head of pancreas: Secondary | ICD-10-CM | POA: Diagnosis present

## 2020-09-29 LAB — CBC WITH DIFFERENTIAL/PLATELET
Abs Immature Granulocytes: 0.03 10*3/uL (ref 0.00–0.07)
Basophils Absolute: 0 10*3/uL (ref 0.0–0.1)
Basophils Relative: 1 %
Eosinophils Absolute: 0.1 10*3/uL (ref 0.0–0.5)
Eosinophils Relative: 1 %
HCT: 32.6 % — ABNORMAL LOW (ref 36.0–46.0)
Hemoglobin: 11.2 g/dL — ABNORMAL LOW (ref 12.0–15.0)
Immature Granulocytes: 1 %
Lymphocytes Relative: 26 %
Lymphs Abs: 1.4 10*3/uL (ref 0.7–4.0)
MCH: 35.3 pg — ABNORMAL HIGH (ref 26.0–34.0)
MCHC: 34.4 g/dL (ref 30.0–36.0)
MCV: 102.8 fL — ABNORMAL HIGH (ref 80.0–100.0)
Monocytes Absolute: 0.3 10*3/uL (ref 0.1–1.0)
Monocytes Relative: 5 %
Neutro Abs: 3.6 10*3/uL (ref 1.7–7.7)
Neutrophils Relative %: 66 %
Platelets: 118 10*3/uL — ABNORMAL LOW (ref 150–400)
RBC: 3.17 MIL/uL — ABNORMAL LOW (ref 3.87–5.11)
RDW: 13 % (ref 11.5–15.5)
WBC: 5.4 10*3/uL (ref 4.0–10.5)
nRBC: 0 % (ref 0.0–0.2)

## 2020-10-08 ENCOUNTER — Inpatient Hospital Stay: Payer: Medicare Other

## 2020-10-08 ENCOUNTER — Inpatient Hospital Stay (HOSPITAL_BASED_OUTPATIENT_CLINIC_OR_DEPARTMENT_OTHER): Payer: Medicare Other | Admitting: Oncology

## 2020-10-08 ENCOUNTER — Encounter: Payer: Self-pay | Admitting: Oncology

## 2020-10-08 VITALS — BP 116/74 | HR 62 | Temp 98.7°F | Resp 16 | Wt 122.8 lb

## 2020-10-08 DIAGNOSIS — Z95828 Presence of other vascular implants and grafts: Secondary | ICD-10-CM

## 2020-10-08 DIAGNOSIS — Z5111 Encounter for antineoplastic chemotherapy: Secondary | ICD-10-CM

## 2020-10-08 DIAGNOSIS — D6481 Anemia due to antineoplastic chemotherapy: Secondary | ICD-10-CM

## 2020-10-08 DIAGNOSIS — D696 Thrombocytopenia, unspecified: Secondary | ICD-10-CM

## 2020-10-08 DIAGNOSIS — G620 Drug-induced polyneuropathy: Secondary | ICD-10-CM

## 2020-10-08 DIAGNOSIS — C259 Malignant neoplasm of pancreas, unspecified: Secondary | ICD-10-CM

## 2020-10-08 DIAGNOSIS — T451X5A Adverse effect of antineoplastic and immunosuppressive drugs, initial encounter: Secondary | ICD-10-CM

## 2020-10-08 LAB — COMPREHENSIVE METABOLIC PANEL
ALT: 34 U/L (ref 0–44)
AST: 35 U/L (ref 15–41)
Albumin: 3.8 g/dL (ref 3.5–5.0)
Alkaline Phosphatase: 146 U/L — ABNORMAL HIGH (ref 38–126)
Anion gap: 10 (ref 5–15)
BUN: 14 mg/dL (ref 8–23)
CO2: 24 mmol/L (ref 22–32)
Calcium: 8.7 mg/dL — ABNORMAL LOW (ref 8.9–10.3)
Chloride: 110 mmol/L (ref 98–111)
Creatinine, Ser: 0.63 mg/dL (ref 0.44–1.00)
GFR, Estimated: >60 mL/min (ref >60)
Glucose, Bld: 119 mg/dL — ABNORMAL HIGH (ref 70–99)
Potassium: 3.9 mmol/L (ref 3.5–5.1)
Sodium: 144 mmol/L (ref 135–145)
Total Bilirubin: 0.6 mg/dL (ref 0.3–1.2)
Total Protein: 6 g/dL — ABNORMAL LOW (ref 6.5–8.1)

## 2020-10-08 LAB — CBC WITH DIFFERENTIAL/PLATELET
Abs Immature Granulocytes: 0 10*3/uL (ref 0.00–0.07)
Basophils Absolute: 0 10*3/uL (ref 0.0–0.1)
Basophils Relative: 1 %
Eosinophils Absolute: 0.1 10*3/uL (ref 0.0–0.5)
Eosinophils Relative: 4 %
HCT: 30.8 % — ABNORMAL LOW (ref 36.0–46.0)
Hemoglobin: 10.7 g/dL — ABNORMAL LOW (ref 12.0–15.0)
Immature Granulocytes: 0 %
Lymphocytes Relative: 29 %
Lymphs Abs: 0.8 10*3/uL (ref 0.7–4.0)
MCH: 36.3 pg — ABNORMAL HIGH (ref 26.0–34.0)
MCHC: 34.7 g/dL (ref 30.0–36.0)
MCV: 104.4 fL — ABNORMAL HIGH (ref 80.0–100.0)
Monocytes Absolute: 0.5 10*3/uL (ref 0.1–1.0)
Monocytes Relative: 17 %
Neutro Abs: 1.4 10*3/uL — ABNORMAL LOW (ref 1.7–7.7)
Neutrophils Relative %: 49 %
Platelets: 147 10*3/uL — ABNORMAL LOW (ref 150–400)
RBC: 2.95 MIL/uL — ABNORMAL LOW (ref 3.87–5.11)
RDW: 13.4 % (ref 11.5–15.5)
WBC: 2.8 10*3/uL — ABNORMAL LOW (ref 4.0–10.5)
nRBC: 0 % (ref 0.0–0.2)

## 2020-10-08 MED ORDER — DIPHENHYDRAMINE HCL 25 MG PO CAPS: 25.0000 mg | ORAL_CAPSULE | Freq: Once | ORAL | Status: DC

## 2020-10-08 MED ORDER — HEPARIN SOD (PORK) LOCK FLUSH 100 UNIT/ML IV SOLN
500.0000 [IU] | Freq: Once | INTRAVENOUS | Status: DC
  Filled 2020-10-08: qty 5

## 2020-10-08 MED ORDER — IRINOTECAN HCL CHEMO INJECTION 100 MG/5ML
150.0000 mg/m2 | Freq: Once | INTRAVENOUS | Status: AC
  Administered 2020-10-08: 260 mg via INTRAVENOUS
  Filled 2020-10-08: qty 10

## 2020-10-08 MED ORDER — SODIUM CHLORIDE 0.9 % IV SOLN
10.0000 mg | Freq: Once | INTRAVENOUS | Status: AC
  Administered 2020-10-08: 10 mg via INTRAVENOUS
  Filled 2020-10-08: qty 10

## 2020-10-08 MED ORDER — SODIUM CHLORIDE 0.9 % IV SOLN
650.0000 mg | Freq: Once | INTRAVENOUS | Status: AC
  Administered 2020-10-08: 650 mg via INTRAVENOUS
  Filled 2020-10-08: qty 32.5

## 2020-10-08 MED ORDER — SODIUM CHLORIDE 0.9 % IV SOLN
150.0000 mg | Freq: Once | INTRAVENOUS | Status: AC
  Administered 2020-10-08: 150 mg via INTRAVENOUS
  Filled 2020-10-08: qty 150

## 2020-10-08 MED ORDER — DIPHENHYDRAMINE HCL 25 MG PO CAPS
25.0000 mg | ORAL_CAPSULE | Freq: Once | ORAL | Status: AC
  Administered 2020-10-08: 25 mg via ORAL
  Filled 2020-10-08: qty 1

## 2020-10-08 MED ORDER — SODIUM CHLORIDE 0.9% FLUSH
10.0000 mL | Freq: Once | INTRAVENOUS | Status: AC
Start: 2020-10-08 — End: 2020-10-08
  Administered 2020-10-08: 10 mL via INTRAVENOUS
  Filled 2020-10-08: qty 10

## 2020-10-08 MED ORDER — SODIUM CHLORIDE 0.9 % IV SOLN
Freq: Once | INTRAVENOUS | Status: AC
  Filled 2020-10-08: qty 250

## 2020-10-08 MED ORDER — SODIUM CHLORIDE 0.9 % IV SOLN
2400.0000 mg/m2 | INTRAVENOUS | Status: DC
  Administered 2020-10-08: 4000 mg via INTRAVENOUS
  Filled 2020-10-08: qty 80

## 2020-10-08 MED ORDER — PALONOSETRON HCL INJECTION 0.25 MG/5ML
0.2500 mg | Freq: Once | INTRAVENOUS | Status: AC
  Administered 2020-10-08: 0.25 mg via INTRAVENOUS
  Filled 2020-10-08: qty 5

## 2020-10-08 MED ORDER — ATROPINE SULFATE 1 MG/ML IJ SOLN
0.5000 mg | Freq: Once | INTRAMUSCULAR | Status: AC
  Administered 2020-10-08: 0.5 mg via INTRAVENOUS
  Filled 2020-10-08: qty 1

## 2020-10-08 NOTE — Progress Notes (Signed)
Patient denies new problems/concerns today.   °

## 2020-10-08 NOTE — Progress Notes (Signed)
Hematology/Oncology follow up note [facility] [phone] [phone]   Patient Care Team: Flinchum, Kelby Aline, FNP as PCP - General (Family Medicine) Flinchum, Kelby Aline, FNP (Family Medicine) [name], RN as Oncology Nurse Navigator [name], MD as Consulting Physician (Oncology)  REFERRING PROVIDER: Sharmon Leyden*  CHIEF COMPLAINTS/REASON FOR VISIT:  Follow up for treatment of pancreatic adenocarcinoma  HISTORY OF PRESENTING ILLNESS:   Cassandra [name] is a  65 y.o.  female with PMH listed below was seen in consultation at the request of  Flinchum, Michelle S, F*  for evaluation of pancreatic adenocarcinoma Patient initially presented with jaundice, transaminitis, bilirubin was 9.9.  CA 19-9 was 1874.  Patient also reports unintentional weight loss. 02/08/2020 MRI abdomen and MRCP with and without contrast was done at [facility] which showed pancreatic head mass measuring up to 3 cm, with marked associated narrowing of the portal vein confluence.  SMA is preserved.  Marked intrahepatic and extrahepatic biliary duct dilatation as well as mild dilatation of the main pancreatic duct. Multiple hepatic masses highly concerning for metastatic disease.  Patient underwent EUS on 02/19/2020, which showed irregular mass identified in the pancreatic head, hypoechoic, measured 75mmx33mm, sonographic evidence concerning for invasion into the superior mesenteric artery.  There is no sign of significant abnormality in the main pancreatic duct.  Dilatation of common bile duct which measured up to 16 mm.  Region of celiac artery was visualized and showed no signs of significant abnormality.  No lymphadenopathy.  FNA showed adenocarcinoma.  02/19/2020, ERCP, malignant.  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  The stricture was treated with placement of wall flex metal stent.  Patient was  seen by [facility] oncology Dr. Pia Mau and was recommended for 3 drug regimen FOLFIRINOX.  Patient prefers to do chemotherapy locally at [facility].  Patient was referred to establish care today. She denies any pain.  Since stent placement, skin jaundice has improved.  Itchiness has also improved. Patient was accompanied by her husband today.  She has a family history of breast cancer in sister and paternal aunt, colon cancer paternal grandmother.  #no reportable targetable mutation on NGS 9/14/2021cycle 1 FOLFIRINOX.  Patient received oxaliplatin and about 50% of Irinotecan on day 1 and had experienced neurologic symptoms.  She went to ER and working diagnosis is TIA. # 03/19/2020-03/21/2020 patient was admitted due to sepsis with strep pneumonia bacteremia.  Patient was treated with IV Rocephin.  TEE was done which showed no vegetation.  No PFO or ASD.  Patient was discharged home and he finished full course of 14 days of IV Rocephin on 04/02/2020 per ID recommendation.  Repeat blood culture was also negative.  08/25/20 cycle 10 FOLFIRINOX 09/08/20 Cycle 11, FOLFIRI, Oxaliplatin discontinued due to neuropathy   #NGS showed no reportable targetable mutation #Genetic testing-[employer] diagnostic testing showed no pathological variants identified. MS stable, TMB 32mut/mb, KRAS G12D, SF3B1 K700E, TP53 V281fs*30  INTERVAL HISTORY Cassandra [name] is a 65 y.o. female who has above history reviewed by me today presents for evaluation prior to chemotherapy. Problems and complaints are listed below Patient reports feeling well since the discontinuation of oxaliplatin.  Chemotherapy appears to be more tolerable for her. Neuropathy has slightly improved. She has question about Alpha-lipoic acid.    Review of Systems  Constitutional: Positive for fatigue. Negative for appetite change, chills, fever and unexpected weight change.  HENT:   Negative for  hearing loss and voice change.   Eyes: Negative for eye problems.   Respiratory: Negative for chest tightness and cough.   Cardiovascular: Negative for chest pain.  Gastrointestinal: Negative for abdominal distention, abdominal pain and blood in stool.  Endocrine: Negative for hot flashes.  Genitourinary: Negative for difficulty urinating and frequency.   Musculoskeletal: Negative for arthralgias.  Skin: Negative for itching and rash.  Neurological: Positive for numbness. Negative for extremity weakness.  Hematological: Negative for adenopathy.  Psychiatric/Behavioral: Negative for confusion.    MEDICAL HISTORY:  Past Medical History:  Diagnosis Date  . Cancer [facility])    pancreatic cancer  . Colon polyps   . Family history of breast cancer   . Neutropenia ([place]) 04/18/2020  . Osteopenia after menopause 05/2017   femoral neck T score -2.0    SURGICAL HISTORY: Past Surgical History:  Procedure Laterality Date  . COLONOSCOPY  07/2015   WNL  . COLONOSCOPY  2008/2011  . OVARIAN CYST REMOVAL  1992   dermoid-Dr Bayard  . PORTA CATH INSERTION N/A 03/07/2020   Procedure: PORTA CATH INSERTION;  Surgeon: Algernon Huxley, MD;  Location: [facility];  Service: Cardiovascular;  Laterality: N/A;  . TEE WITHOUT CARDIOVERSION N/A 03/21/2020   Procedure: TRANSESOPHAGEAL ECHOCARDIOGRAM (TEE);  Surgeon: Dionisio David, MD;  Location: [facility] ORS;  Service: Cardiovascular;  Laterality: N/A;  . TUBAL LIGATION  1993    SOCIAL HISTORY: Social History   Socioeconomic History  . Marital status: Married    Spouse name: Not on file  . Number of children: 2  . Years of education: Not on file  . Highest education level: Not on file  Occupational History  . Occupation: Pharmacist, hospital    Comment: retired  . Occupation: Farmer  Tobacco Use  . Smoking status: Former Smoker    Packs/day: 0.50    Years: 10.00    Pack years: 5.00    Types: Cigarettes    Quit date: 1987    Years since quitting: 35.3  . Smokeless tobacco: Never Used  . Tobacco comment: Quit smoking 1987   Vaping Use  . Vaping Use: Never used  Substance and Sexual Activity  . Alcohol use: Not Currently    Comment: 0-2 mixed drinks a day  . Drug use: No  . Sexual activity: Not Currently    Birth control/protection: Post-menopausal  Other Topics Concern  . Not on file  Social History Narrative  . Not on file   Social Determinants of Health   Financial Resource Strain: Not on file  Food Insecurity: Not on file  Transportation Needs: Not on file  Physical Activity: Not on file  Stress: Not on file  Social Connections: Not on file  Intimate Partner Violence: Not on file    FAMILY HISTORY: Family History  Problem Relation Age of Onset  . Breast cancer Paternal Aunt 7  . Diabetes Mother   . Osteoporosis Mother   . Hyperlipidemia Father   . Rheumatic fever Father   . Valvular heart disease Father   . Cancer Paternal Grandmother        possible colon  . Breast cancer Sister 60    ALLERGIES:  is allergic to penicillin g.  MEDICATIONS:  Current Outpatient Medications  Medication Sig Dispense Refill  . atorvastatin (LIPITOR) 40 MG tablet Take 1 tablet (40 mg total) by mouth daily. (Patient not taking: No sig reported) 30 tablet 3  . Calcium Carbonate-Vit D-Min (CALCIUM 1200 PO) Take by mouth.    Marland Kitchen  Cholecalciferol 25 MCG (1000 UT) tablet Take 2,000 Units by mouth daily.    Marland Kitchen lidocaine-prilocaine (EMLA) cream Apply to affected area once (Patient taking differently: Apply 1 application topically as directed.) 30 g 3  . loperamide (IMODIUM A-D) 2 MG tablet Take 2 at onset of diarrhea, then 1 every 2hrs until 12hr without a BM. May take 2 tab every 4hrs at bedtime. If diarrhea recurs repeat. (Patient not taking: No sig reported) 100 tablet 1  . magic mouthwash w/lidocaine SOLN Take 5 mLs by mouth 4 (four) times daily as needed for mouth pain. Sig: Swish & Spit 5-10 ml four times a day as needed. Dispense 480 ml. 1RF (Patient not taking: No sig reported) 480 mL 1  . ondansetron  (ZOFRAN) 8 MG tablet Take 1 tablet (8 mg total) by mouth 2 (two) times daily as needed. Start on day 3 after chemotherapy. (Patient not taking: No sig reported) 30 tablet 1  . potassium chloride SA (KLOR-CON M20) 20 MEQ tablet Take 1 tablet (20 mEq total) by mouth daily. 30 tablet 0  . prochlorperazine (COMPAZINE) 10 MG tablet Take 1 tablet (10 mg total) by mouth every 6 (six) hours as needed (Nausea or vomiting). 30 tablet 1  . simethicone (GAS-X) 80 MG chewable tablet Chew 1 tablet (80 mg total) by mouth every 8 (eight) hours as needed for flatulence. 60 tablet 0   No current facility-administered medications for this visit.     PHYSICAL EXAMINATION: ECOG PERFORMANCE STATUS: 1 - Symptomatic but completely ambulatory Vitals:   10/08/20 0841  BP: 116/74  Pulse: 62  Resp: 16  Temp: 98.7 F (37.1 C)   Filed Weights   10/08/20 0841  Weight: 122 lb 12.8 oz (55.7 kg)    Physical Exam Constitutional:      General: She is not in acute distress. HENT:     Head: Normocephalic and atraumatic.  Eyes:     General: No scleral icterus. Cardiovascular:     Rate and Rhythm: Normal rate and regular rhythm.     Heart sounds: Normal heart sounds.  Pulmonary:     Effort: Pulmonary effort is normal. No respiratory distress.     Breath sounds: No wheezing.  Abdominal:     General: Bowel sounds are normal. There is no distension.     Palpations: Abdomen is soft.  Musculoskeletal:        General: No deformity. Normal range of motion.     Cervical back: Normal range of motion and neck supple.  Skin:    General: Skin is warm and dry.     Coloration: Skin is not jaundiced.     Findings: No erythema or rash.  Neurological:     Mental Status: She is alert and oriented to person, place, and time. Mental status is at baseline.     Cranial Nerves: No cranial nerve deficit.     Coordination: Coordination normal.  Psychiatric:        Mood and Affect: Mood normal.     LABORATORY DATA:  I have  reviewed the data as listed Lab Results  Component Value Date   WBC 5.4 09/29/2020   HGB 11.2 (L) 09/29/2020   HCT 32.6 (L) 09/29/2020   MCV 102.8 (H) 09/29/2020   PLT 118 (L) 09/29/2020   Recent Labs    02/06/20 1049 02/29/20 1314 03/19/20 1024 03/20/20 0411 03/21/20 0318 04/04/20 0827 08/25/20 0815 09/08/20 0833 09/22/20 0816  NA 137   < > 129* 136 140   < >  141 141 143  K 4.1   < > 3.8 4.0 3.7   < > 3.5 3.4* 3.6  CL 99   < > 95* 104 108   < > 106 107 109  CO2 23   < > $R'25 25 25   'Ri$ < > $R'25 26 25  'SF$ GLUCOSE 113*   < > 145* 126* 114*   < > 117* 122* 112*  BUN 11   < > 11 7* 6*   < > $R'11 10 10  'XT$ CREATININE 0.80   < > 0.85 0.66 0.52   < > 0.61 0.63 0.67  CALCIUM 10.2   < > 8.9 8.3* 8.2*   < > 8.7* 8.6* 8.6*  GFRNONAA 78   < > >60 >60 >60   < > >60 >60 >60  GFRAA 90   < > >60 >60 >60  --   --   --   --   PROT 7.0   < > 7.1  --  5.3*   < > 6.2* 6.2* 5.9*  ALBUMIN 4.9*   < > 3.5  --  2.5*   < > 3.9 3.8 3.8  AST 195*   < > 30  --  52*   < > 40 43* 49*  ALT 366*   < > 43  --  74*   < > 41 44 48*  ALKPHOS 934*   < > 125  --  112   < > 229* 237* 231*  BILITOT 9.9*   < > 1.5*  --  1.0   < > 0.5 0.4 0.4  BILIDIR 8.11*  --   --   --   --   --   --   --   --   IBILI 1.79*  --   --   --   --   --   --   --   --    < > = values in this interval not displayed.   Iron/TIBC/Ferritin/ %Sat No results found for: IRON, TIBC, FERRITIN, IRONPCTSAT    RADIOGRAPHIC STUDIES: I have personally reviewed the radiological images as listed and agreed with the findings in the report. Reviewed findings of MRI abdomen MRCP done at [facility]. No results found.   ASSESSMENT & PLAN:  1. Primary pancreatic cancer ([place])   2. Encounter for antineoplastic chemotherapy   3. Anemia due to antineoplastic chemotherapy   4. Thrombocytopenia ([place])   5. Chemotherapy-induced neuropathy ([place])   6. 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  No discrete mass detected on most recent MRI abdomen Labs are reviewed and discussed with patient Proceed with FOLFIRI today.   #Transaminitis, intermittent.  Stable off Lipitor. #Irinotecan-associated dysarthria, lip/tongue numbness stable symptoms Irinotecan to be infused over 180 minutes.  Atropine 0.5 mg once prior to the irinotecan.  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   Patient follows up with neurology for management..  Continue acupuncture. We looked up about the information of alpha lipoic acid which is an antioxidant which may help her neuropathy symptoms, based on small clinical studies. main side effects include nausea vomiting hypoglycemia.  This has not been studied in large-scale clinical trial to prove the efficacy.  #Chemotherapy-induced anemia and thrombocytopenia.  Hemoglobin  has slightly decreased to 10.7.  Monitor closely. Platelet count is stable.  Supportive care measures are necessary for patient well-being and will be provided as necessary. We spent sufficient time to discuss many aspect of care, questions were answered to patient's satisfaction.   All questions were answered. The patient knows to call the clinic with any problems questions or concerns.  Return of visit: 2 weeks  [name], MD, PhD Hematology Oncology [facility] at [facility] Pager- [phone] 10/08/2020

## 2020-10-10 ENCOUNTER — Inpatient Hospital Stay: Payer: Medicare Other

## 2020-10-10 ENCOUNTER — Other Ambulatory Visit: Payer: Self-pay

## 2020-10-10 DIAGNOSIS — C259 Malignant neoplasm of pancreas, unspecified: Secondary | ICD-10-CM

## 2020-10-10 DIAGNOSIS — Z5111 Encounter for antineoplastic chemotherapy: Secondary | ICD-10-CM | POA: Diagnosis not present

## 2020-10-10 MED ORDER — HEPARIN SOD (PORK) LOCK FLUSH 100 UNIT/ML IV SOLN
500.0000 [IU] | Freq: Once | INTRAVENOUS | Status: AC | PRN
  Administered 2020-10-10: 500 [IU]
  Filled 2020-10-10: qty 5

## 2020-10-10 MED ORDER — SODIUM CHLORIDE 0.9% FLUSH
10.0000 mL | INTRAVENOUS | Status: DC | PRN
  Administered 2020-10-10: 10 mL
  Filled 2020-10-10: qty 10

## 2020-10-10 MED ORDER — HEPARIN SOD (PORK) LOCK FLUSH 100 UNIT/ML IV SOLN
INTRAVENOUS | Status: AC
  Filled 2020-10-10: qty 5

## 2020-10-21 ENCOUNTER — Other Ambulatory Visit: Payer: Self-pay

## 2020-10-21 ENCOUNTER — Inpatient Hospital Stay: Payer: Medicare Other

## 2020-10-21 NOTE — Progress Notes (Signed)
Nutrition Assessment   Reason for Assessment:   Weight loss   ASSESSMENT:  65 year old female with metastatic pancreatic cancer.  Past medical histroy of HLD, TIA.  Patient currently on folfiri, oxaliplatin has been removed.    Met with patient in clinic.  Patient reports that she has a good appetite and eats well but wanting to gain some of her weight back.  Denies any nutrition impact symptoms.  Having issues with neuropathy and receiving acupuncture.  Reports typical breakfast is protein waffle, lactose free milk, special K cereal with fruit, juice and coffee or egg with toast and avocado and cheese.  Lunch yesterday was frozen meal of pasta, vegetables, meat.  Dinner last night was [place] chicken breast with butternut squash, cilantro rice and salad with colorful veggies.  Likes fish and other lean meats.  Drinks mostly water, tea, lemonade.  Drinks boost high protein shake 1-2 times daily.     Medications: calcium vit d, KCL, compazine, magic mouthwash, zofran, gas-x   Labs: reviewed   Anthropometrics:   Height: 67 inches Weight: 122 lb 12.8 oz on 4/13 UBW: 170 lb  06/23/20 130 lb BMI: 19  6% weight loss in the last 4 months, concerning   Estimated Energy Needs  Kcals: 1650-1925 calories Protein: 83-96 g Fluid: 1.6 L   NUTRITION DIAGNOSIS: Inadequate oral intake related to cancer and cancer related treatment side effects as evidenced by 8% weight loss in the last 4 months   INTERVENTION:  Discussed importance of increasing calories and protein to promote weight gain.  Discussed calorie and protein goals with patient.  Provided handout on High Calorie, High Protein diet and snack ideas.   Encouraged patient to keep food diary with estimated calories consumed. Encouraged trying higher calorie shake. Samples given (ensure complete, boost plus, [employer] 1.4 and shake, orgain).  Recipe booklet given.   Contact information given   MONITORING, EVALUATION, GOAL:  weight trends, intake   Next Visit: to be determined with treatment  Stevie Charter B. Zenia Resides, [place], [place] Registered Dietitian [phone] (mobile)

## 2020-10-22 ENCOUNTER — Inpatient Hospital Stay: Payer: Medicare Other

## 2020-10-22 ENCOUNTER — Inpatient Hospital Stay (HOSPITAL_BASED_OUTPATIENT_CLINIC_OR_DEPARTMENT_OTHER): Payer: Medicare Other | Admitting: Oncology

## 2020-10-22 ENCOUNTER — Encounter: Payer: Self-pay | Admitting: Oncology

## 2020-10-22 VITALS — BP 113/73 | HR 64 | Temp 98.6°F | Resp 18 | Wt 121.5 lb

## 2020-10-22 DIAGNOSIS — Z5111 Encounter for antineoplastic chemotherapy: Secondary | ICD-10-CM | POA: Diagnosis not present

## 2020-10-22 DIAGNOSIS — D6481 Anemia due to antineoplastic chemotherapy: Secondary | ICD-10-CM

## 2020-10-22 DIAGNOSIS — C259 Malignant neoplasm of pancreas, unspecified: Secondary | ICD-10-CM

## 2020-10-22 DIAGNOSIS — G620 Drug-induced polyneuropathy: Secondary | ICD-10-CM

## 2020-10-22 DIAGNOSIS — D696 Thrombocytopenia, unspecified: Secondary | ICD-10-CM | POA: Diagnosis not present

## 2020-10-22 DIAGNOSIS — Z95828 Presence of other vascular implants and grafts: Secondary | ICD-10-CM

## 2020-10-22 DIAGNOSIS — T451X5A Adverse effect of antineoplastic and immunosuppressive drugs, initial encounter: Secondary | ICD-10-CM

## 2020-10-22 LAB — CBC WITH DIFFERENTIAL/PLATELET
Abs Immature Granulocytes: 0 10*3/uL (ref 0.00–0.07)
Basophils Absolute: 0 10*3/uL (ref 0.0–0.1)
Basophils Relative: 1 %
Eosinophils Absolute: 0.1 10*3/uL (ref 0.0–0.5)
Eosinophils Relative: 4 %
HCT: 31.3 % — ABNORMAL LOW (ref 36.0–46.0)
Hemoglobin: 10.7 g/dL — ABNORMAL LOW (ref 12.0–15.0)
Immature Granulocytes: 0 %
Lymphocytes Relative: 24 %
Lymphs Abs: 0.8 10*3/uL (ref 0.7–4.0)
MCH: 35.2 pg — ABNORMAL HIGH (ref 26.0–34.0)
MCHC: 34.2 g/dL (ref 30.0–36.0)
MCV: 103 fL — ABNORMAL HIGH (ref 80.0–100.0)
Monocytes Absolute: 0.5 10*3/uL (ref 0.1–1.0)
Monocytes Relative: 13 %
Neutro Abs: 2.1 10*3/uL (ref 1.7–7.7)
Neutrophils Relative %: 58 %
Platelets: 151 10*3/uL (ref 150–400)
RBC: 3.04 MIL/uL — ABNORMAL LOW (ref 3.87–5.11)
RDW: 12.5 % (ref 11.5–15.5)
WBC: 3.5 10*3/uL — ABNORMAL LOW (ref 4.0–10.5)
nRBC: 0 % (ref 0.0–0.2)

## 2020-10-22 LAB — COMPREHENSIVE METABOLIC PANEL
ALT: 34 U/L (ref 0–44)
AST: 34 U/L (ref 15–41)
Albumin: 3.7 g/dL (ref 3.5–5.0)
Alkaline Phosphatase: 144 U/L — ABNORMAL HIGH (ref 38–126)
Anion gap: 11 (ref 5–15)
BUN: 14 mg/dL (ref 8–23)
CO2: 23 mmol/L (ref 22–32)
Calcium: 8.8 mg/dL — ABNORMAL LOW (ref 8.9–10.3)
Chloride: 106 mmol/L (ref 98–111)
Creatinine, Ser: 0.74 mg/dL (ref 0.44–1.00)
GFR, Estimated: >60 mL/min (ref >60)
Glucose, Bld: 96 mg/dL (ref 70–99)
Potassium: 4.1 mmol/L (ref 3.5–5.1)
Sodium: 140 mmol/L (ref 135–145)
Total Bilirubin: 0.6 mg/dL (ref 0.3–1.2)
Total Protein: 6.1 g/dL — ABNORMAL LOW (ref 6.5–8.1)

## 2020-10-22 MED ORDER — SODIUM CHLORIDE 0.9 % IV SOLN
2400.0000 mg/m2 | INTRAVENOUS | Status: DC
  Administered 2020-10-22: 4000 mg via INTRAVENOUS
  Filled 2020-10-22: qty 80

## 2020-10-22 MED ORDER — SODIUM CHLORIDE 0.9 % IV SOLN
150.0000 mg | Freq: Once | INTRAVENOUS | Status: AC
  Administered 2020-10-22: 150 mg via INTRAVENOUS
  Filled 2020-10-22: qty 150

## 2020-10-22 MED ORDER — SODIUM CHLORIDE 0.9 % IV SOLN
Freq: Once | INTRAVENOUS | Status: AC
Start: 2020-10-22 — End: 2020-10-22
  Filled 2020-10-22: qty 250

## 2020-10-22 MED ORDER — PALONOSETRON HCL INJECTION 0.25 MG/5ML
0.2500 mg | Freq: Once | INTRAVENOUS | Status: AC
  Administered 2020-10-22: 0.25 mg via INTRAVENOUS
  Filled 2020-10-22: qty 5

## 2020-10-22 MED ORDER — SODIUM CHLORIDE 0.9 % IV SOLN
10.0000 mg | Freq: Once | INTRAVENOUS | Status: AC
  Administered 2020-10-22: 10 mg via INTRAVENOUS
  Filled 2020-10-22: qty 10

## 2020-10-22 MED ORDER — SODIUM CHLORIDE 0.9% FLUSH
10.0000 mL | Freq: Once | INTRAVENOUS | Status: AC
Start: 2020-10-22 — End: 2020-10-22
  Administered 2020-10-22: 10 mL via INTRAVENOUS
  Filled 2020-10-22: qty 10

## 2020-10-22 MED ORDER — SODIUM CHLORIDE 0.9 % IV SOLN
650.0000 mg | Freq: Once | INTRAVENOUS | Status: AC
  Administered 2020-10-22: 650 mg via INTRAVENOUS
  Filled 2020-10-22: qty 25

## 2020-10-22 MED ORDER — SODIUM CHLORIDE 0.9 % IV SOLN
150.0000 mg/m2 | Freq: Once | INTRAVENOUS | Status: AC
  Administered 2020-10-22: 260 mg via INTRAVENOUS
  Filled 2020-10-22: qty 10

## 2020-10-22 MED ORDER — ATROPINE SULFATE 1 MG/ML IJ SOLN
0.5000 mg | Freq: Once | INTRAMUSCULAR | Status: AC
  Administered 2020-10-22: 0.5 mg via INTRAVENOUS
  Filled 2020-10-22: qty 1

## 2020-10-22 MED ORDER — DIPHENHYDRAMINE HCL 25 MG PO CAPS
25.0000 mg | ORAL_CAPSULE | Freq: Once | ORAL | Status: AC
  Administered 2020-10-22: 25 mg via ORAL
  Filled 2020-10-22: qty 1

## 2020-10-22 NOTE — Progress Notes (Signed)
Hematology/Oncology follow up note [facility] [phone] [phone]   Patient Care Team: Flinchum, Kelby Aline, FNP as PCP - General (Family Medicine) Flinchum, Kelby Aline, FNP (Family Medicine) [name], RN as Oncology Nurse Navigator [name], MD as Consulting Physician (Oncology)  REFERRING PROVIDER: Sharmon Leyden*  CHIEF COMPLAINTS/REASON FOR VISIT:  Follow up for treatment of pancreatic adenocarcinoma  HISTORY OF PRESENTING ILLNESS:   Cassandra [name] is a  65 y.o.  female with PMH listed below was seen in consultation at the request of  Flinchum, Michelle S, F*  for evaluation of pancreatic adenocarcinoma Patient initially presented with jaundice, transaminitis, bilirubin was 9.9.  CA 19-9 was 1874.  Patient also reports unintentional weight loss. 02/08/2020 MRI abdomen and MRCP with and without contrast was done at [facility] which showed pancreatic head mass measuring up to 3 cm, with marked associated narrowing of the portal vein confluence.  SMA is preserved.  Marked intrahepatic and extrahepatic biliary duct dilatation as well as mild dilatation of the main pancreatic duct. Multiple hepatic masses highly concerning for metastatic disease.  Patient underwent EUS on 02/19/2020, which showed irregular mass identified in the pancreatic head, hypoechoic, measured 34mmx33mm, sonographic evidence concerning for invasion into the superior mesenteric artery.  There is no sign of significant abnormality in the main pancreatic duct.  Dilatation of common bile duct which measured up to 16 mm.  Region of celiac artery was visualized and showed no signs of significant abnormality.  No lymphadenopathy.  FNA showed adenocarcinoma.  02/19/2020, ERCP, malignant.  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  The stricture was treated with placement of wall flex metal stent.  Patient was  seen by [facility] oncology Dr. Pia Mau and was recommended for 3 drug regimen FOLFIRINOX.  Patient prefers to do chemotherapy locally at [facility].  Patient was referred to establish care today. She denies any pain.  Since stent placement, skin jaundice has improved.  Itchiness has also improved. Patient was accompanied by her husband today.  She has a family history of breast cancer in sister and paternal aunt, colon cancer paternal grandmother.  #no reportable targetable mutation on NGS 9/14/2021cycle 1 FOLFIRINOX.  Patient received oxaliplatin and about 50% of Irinotecan on day 1 and had experienced neurologic symptoms.  She went to ER and working diagnosis is TIA. # 03/19/2020-03/21/2020 patient was admitted due to sepsis with strep pneumonia bacteremia.  Patient was treated with IV Rocephin.  TEE was done which showed no vegetation.  No PFO or ASD.  Patient was discharged home and he finished full course of 14 days of IV Rocephin on 04/02/2020 per ID recommendation.  Repeat blood culture was also negative.  08/25/20 cycle 10 FOLFIRINOX 09/08/20 Cycle 11, FOLFIRI, Oxaliplatin discontinued due to neuropathy   #NGS showed no reportable targetable mutation #Genetic testing-[employer] diagnostic testing showed no pathological variants identified. MS stable, TMB 49mut/mb, KRAS G12D, SF3B1 K700E, TP53 V280fs*30  INTERVAL HISTORY Cassandra [name] is a 65 y.o. female who has above history reviewed by me today presents for evaluation prior to chemotherapy. Problems and complaints are listed below Patient reports feeling well since the discontinuation of oxaliplatin.  Chemotherapy symptoms are stable.  She feels acupuncture helps her symptoms. Abdominal bloating has improved.  No nausea vomiting diarrhea     Review of Systems  Constitutional: Negative for appetite change, chills, fatigue, fever and unexpected weight change.  HENT:   Negative  for hearing loss and voice change.   Eyes: Negative for eye problems.   Respiratory: Negative for chest tightness and cough.   Cardiovascular: Negative for chest pain.  Gastrointestinal: Negative for abdominal distention, abdominal pain and blood in stool.  Endocrine: Negative for hot flashes.  Genitourinary: Negative for difficulty urinating and frequency.   Musculoskeletal: Negative for arthralgias.  Skin: Negative for itching and rash.  Neurological: Positive for numbness. Negative for extremity weakness.  Hematological: Negative for adenopathy.  Psychiatric/Behavioral: Negative for confusion.    MEDICAL HISTORY:  Past Medical History:  Diagnosis Date  . Cancer [facility])    pancreatic cancer  . Colon polyps   . Family history of breast cancer   . Neutropenia ([place]) 04/18/2020  . Osteopenia after menopause 05/2017   femoral neck T score -2.0    SURGICAL HISTORY: Past Surgical History:  Procedure Laterality Date  . COLONOSCOPY  07/2015   WNL  . COLONOSCOPY  2008/2011  . OVARIAN CYST REMOVAL  1992   dermoid-Dr [place]  . PORTA CATH INSERTION N/A 03/07/2020   Procedure: PORTA CATH INSERTION;  Surgeon: Algernon Huxley, MD;  Location: [place] [facility] LAB;  Service: Cardiovascular;  Laterality: N/A;  . TEE WITHOUT CARDIOVERSION N/A 03/21/2020   Procedure: TRANSESOPHAGEAL ECHOCARDIOGRAM (TEE);  Surgeon: Dionisio David, MD;  Location: [facility] ORS;  Service: Cardiovascular;  Laterality: N/A;  . TUBAL LIGATION  1993    SOCIAL HISTORY: Social History   Socioeconomic History  . Marital status: Married    Spouse name: Not on file  . Number of children: 2  . Years of education: Not on file  . Highest education level: Not on file  Occupational History  . Occupation: Pharmacist, hospital    Comment: retired  . Occupation: Farmer  Tobacco Use  . Smoking status: Former Smoker    Packs/day: 0.50    Years: 10.00    Pack years: 5.00    Types: Cigarettes    Quit date: 1987    Years since quitting: 35.3  . Smokeless tobacco: Never Used  . Tobacco comment: Quit smoking 1987   Vaping Use  . Vaping Use: Never used  Substance and Sexual Activity  . Alcohol use: Not Currently    Comment: 0-2 mixed drinks a day  . Drug use: No  . Sexual activity: Not Currently    Birth control/protection: Post-menopausal  Other Topics Concern  . Not on file  Social History Narrative  . Not on file   Social Determinants of Health   Financial Resource Strain: Not on file  Food Insecurity: Not on file  Transportation Needs: Not on file  Physical Activity: Not on file  Stress: Not on file  Social Connections: Not on file  Intimate Partner Violence: Not on file    FAMILY HISTORY: Family History  Problem Relation Age of Onset  . Breast cancer Paternal Aunt 45  . Diabetes Mother   . Osteoporosis Mother   . Hyperlipidemia Father   . Rheumatic fever Father   . Valvular heart disease Father   . Cancer Paternal Grandmother        possible colon  . Breast cancer Sister 50    ALLERGIES:  is allergic to penicillin g.  MEDICATIONS:  Current Outpatient Medications  Medication Sig Dispense Refill  . Calcium Carbonate-Vit D-Min (CALCIUM 1200 PO) Take by mouth.    . Cholecalciferol 25 MCG (1000 UT) tablet Take 2,000 Units by mouth daily.    Marland Kitchen lidocaine-prilocaine (EMLA) cream Apply to affected  area once (Patient taking differently: Apply 1 application topically as directed.) 30 g 3  . potassium chloride SA (KLOR-CON M20) 20 MEQ tablet Take 1 tablet (20 mEq total) by mouth daily. 30 tablet 0  . simethicone (GAS-X) 80 MG chewable tablet Chew 1 tablet (80 mg total) by mouth every 8 (eight) hours as needed for flatulence. 60 tablet 0  . atorvastatin (LIPITOR) 40 MG tablet Take 1 tablet (40 mg total) by mouth daily. (Patient not taking: No sig reported) 30 tablet 3  . loperamide (IMODIUM A-D) 2 MG tablet Take 2 at onset of diarrhea, then 1 every 2hrs until 12hr without a BM. May take 2 tab every 4hrs at bedtime. If diarrhea recurs repeat. (Patient not taking: No sig reported) 100  tablet 1  . magic mouthwash w/lidocaine SOLN Take 5 mLs by mouth 4 (four) times daily as needed for mouth pain. Sig: Swish & Spit 5-10 ml four times a day as needed. Dispense 480 ml. 1RF (Patient not taking: No sig reported) 480 mL 1  . ondansetron (ZOFRAN) 8 MG tablet Take 1 tablet (8 mg total) by mouth 2 (two) times daily as needed. Start on day 3 after chemotherapy. (Patient not taking: No sig reported) 30 tablet 1  . prochlorperazine (COMPAZINE) 10 MG tablet Take 1 tablet (10 mg total) by mouth every 6 (six) hours as needed (Nausea or vomiting). (Patient not taking: Reported on 10/22/2020) 30 tablet 1   No current facility-administered medications for this visit.     PHYSICAL EXAMINATION: ECOG PERFORMANCE STATUS: 1 - Symptomatic but completely ambulatory Vitals:   10/22/20 0844  BP: 113/73  Pulse: 64  Resp: 18  Temp: 98.6 F (37 C)   Filed Weights   10/22/20 0844  Weight: 121 lb 8 oz (55.1 kg)    Physical Exam Constitutional:      General: She is not in acute distress. HENT:     Head: Normocephalic and atraumatic.  Eyes:     General: No scleral icterus. Cardiovascular:     Rate and Rhythm: Normal rate and regular rhythm.     Heart sounds: Normal heart sounds.  Pulmonary:     Effort: Pulmonary effort is normal. No respiratory distress.     Breath sounds: No wheezing.  Abdominal:     General: Bowel sounds are normal. There is no distension.     Palpations: Abdomen is soft.  Musculoskeletal:        General: No deformity. Normal range of motion.     Cervical back: Normal range of motion and neck supple.  Skin:    General: Skin is warm and dry.     Coloration: Skin is not jaundiced.     Findings: No erythema or rash.  Neurological:     Mental Status: She is alert and oriented to person, place, and time. Mental status is at baseline.     Cranial Nerves: No cranial nerve deficit.     Coordination: Coordination normal.  Psychiatric:        Mood and Affect: Mood normal.      LABORATORY DATA:  I have reviewed the data as listed Lab Results  Component Value Date   WBC 3.5 (L) 10/22/2020   HGB 10.7 (L) 10/22/2020   HCT 31.3 (L) 10/22/2020   MCV 103.0 (H) 10/22/2020   PLT 151 10/22/2020   Recent Labs    02/06/20 1049 02/29/20 1314 03/19/20 1024 03/20/20 0411 03/21/20 0318 04/04/20 0827 09/22/20 0816 10/08/20 0810 10/22/20 0810  NA  137   < > 129* 136 140   < > 143 144 140  K 4.1   < > 3.8 4.0 3.7   < > 3.6 3.9 4.1  CL 99   < > 95* 104 108   < > 109 110 106  CO2 23   < > $R'25 25 25   'xG$ < > $R'25 24 23  'xP$ GLUCOSE 113*   < > 145* 126* 114*   < > 112* 119* 96  BUN 11   < > 11 7* 6*   < > $R'10 14 14  'Dp$ CREATININE 0.80   < > 0.85 0.66 0.52   < > 0.67 0.63 0.74  CALCIUM 10.2   < > 8.9 8.3* 8.2*   < > 8.6* 8.7* 8.8*  GFRNONAA 78   < > >60 >60 >60   < > >60 >60 >60  GFRAA 90   < > >60 >60 >60  --   --   --   --   PROT 7.0   < > 7.1  --  5.3*   < > 5.9* 6.0* 6.1*  ALBUMIN 4.9*   < > 3.5  --  2.5*   < > 3.8 3.8 3.7  AST 195*   < > 30  --  52*   < > 49* 35 34  ALT 366*   < > 43  --  74*   < > 48* 34 34  ALKPHOS 934*   < > 125  --  112   < > 231* 146* 144*  BILITOT 9.9*   < > 1.5*  --  1.0   < > 0.4 0.6 0.6  BILIDIR 8.11*  --   --   --   --   --   --   --   --   IBILI 1.79*  --   --   --   --   --   --   --   --    < > = values in this interval not displayed.   Iron/TIBC/Ferritin/ %Sat No results found for: IRON, TIBC, FERRITIN, IRONPCTSAT    RADIOGRAPHIC STUDIES: I have personally reviewed the radiological images as listed and agreed with the findings in the report. Reviewed findings of MRI abdomen MRCP done at [facility]. No results found.   ASSESSMENT & PLAN:  1. Primary pancreatic cancer ([place])   2. Encounter for antineoplastic chemotherapy   3. Anemia due to antineoplastic chemotherapy   4. Thrombocytopenia ([place])   5. Chemotherapy-induced neuropathy ([place])   6. 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 I will obtain CT chest and abdomen to monitor disease status and treatment response. CA 19-9 continues to trend down.  #Transaminitis, improved.  Off Lipitor. #Irinotecan-associated dysarthria, lip/tongue numbness stable symptoms Irinotecan to be infused over 180 minutes.  Atropine 0.5 mg once prior to the irinotecan.  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   Patient follows up with neurology for management..  Continue acupuncture.  #Chemotherapy-induced anemia.  Hemoglobin has slightly decreased to 10.7.  Monitor closely.  Supportive care measures are necessary for patient well-being and will be provided as necessary. We spent sufficient time to discuss many aspect  of care, questions were answered to patient's satisfaction.   All questions were answered. The patient knows to call the clinic with any problems questions or concerns.  Return of visit: 2 weeks  [name], MD, PhD Hematology Oncology [facility] Pager- [phone] 10/22/2020

## 2020-10-22 NOTE — Patient Instructions (Signed)
[facility] ONCOLOGY  Discharge Instructions: Thank you for choosing [facility] to provide your oncology and hematology care.  If you have a lab appointment with the [facility], please go directly to the [facility] and check in at the registration area.  Wear comfortable clothing and clothing appropriate for easy access to any Portacath or PICC line.   We strive to give you quality time with your provider. You may need to reschedule your appointment if you arrive late (15 or more minutes).  Arriving late affects you and other patients whose appointments are after yours.  Also, if you miss three or more appointments without notifying the office, you may be dismissed from the clinic at the provider's discretion.      For prescription refill requests, have your pharmacy contact our office and allow 72 hours for refills to be completed.    Today you received the following chemotherapy and/or immunotherapy agents Irinotecan, Leucovorin, and Adrucil       To help prevent nausea and vomiting after your treatment, we encourage you to take your nausea medication as directed.  BELOW ARE SYMPTOMS THAT SHOULD BE REPORTED IMMEDIATELY: . *FEVER GREATER THAN 100.4 F (38 C) OR HIGHER . *CHILLS OR SWEATING . *NAUSEA AND VOMITING THAT IS NOT CONTROLLED WITH YOUR NAUSEA MEDICATION . *UNUSUAL SHORTNESS OF BREATH . *UNUSUAL BRUISING OR BLEEDING . *URINARY PROBLEMS (pain or burning when urinating, or frequent urination) . *BOWEL PROBLEMS (unusual diarrhea, constipation, pain near the anus) . TENDERNESS IN MOUTH AND THROAT WITH OR WITHOUT PRESENCE OF ULCERS (sore throat, sores in mouth, or a toothache) . UNUSUAL RASH, SWELLING OR PAIN  . UNUSUAL VAGINAL DISCHARGE OR ITCHING   Items with * indicate a potential emergency and should be followed up as soon as possible or go to the Emergency Department if any problems should occur.  Please show the CHEMOTHERAPY ALERT  CARD or IMMUNOTHERAPY ALERT CARD at check-in to the Emergency Department and triage nurse.  Should you have questions after your visit or need to cancel or reschedule your appointment, please contact [facility]  [phone] and follow the prompts.  Office hours are 8:00 a.m. to 4:30 p.m. Monday - Friday. Please note that voicemails left after 4:00 p.m. may not be returned until the following business day.  We are closed weekends and major holidays. You have access to a nurse at all times for urgent questions. Please call the main number to the clinic [phone] and follow the prompts.  For any non-urgent questions, you may also contact your provider using MyChart. We now offer e-Visits for anyone 56 and older to request care online for non-urgent symptoms. For details visit [email or web address].   Also download the MyChart app! Go to the app store, search "MyChart", open the app, select [facility], and log in with your MyChart username and password.  Due to Covid, a mask is required upon entering the hospital/clinic. If you do not have a mask, one will be given to you upon arrival. For doctor visits, patients may have 1 support person aged 46 or older with them. For treatment visits, patients cannot have anyone with them due to current Covid guidelines and our immunocompromised population.

## 2020-10-22 NOTE — Progress Notes (Signed)
Pt here for follow up. No new concerns voiced.   

## 2020-10-23 ENCOUNTER — Telehealth: Payer: Self-pay | Admitting: Family Medicine

## 2020-10-23 DIAGNOSIS — E7800 Pure hypercholesterolemia, unspecified: Secondary | ICD-10-CM

## 2020-10-23 LAB — CANCER ANTIGEN 19-9: CA 19-9: 6 U/mL (ref 0–35)

## 2020-10-23 MED ORDER — ATORVASTATIN CALCIUM 40 MG PO TABS: 40.0000 mg | ORAL_TABLET | Freq: Every day | ORAL | 3 refills | Status: DC

## 2020-10-23 NOTE — Telephone Encounter (Signed)
[facility] faxed refill request for the following medications:  atorvastatin (LIPITOR) 40 MG tablet  Last Rx: 11/21 30 day supply with 3 refills  LOV: 02/06/20 with Dr. B Please advise. Thanks TNP

## 2020-10-24 ENCOUNTER — Other Ambulatory Visit: Payer: Self-pay

## 2020-10-24 ENCOUNTER — Inpatient Hospital Stay: Payer: Medicare Other

## 2020-10-24 DIAGNOSIS — Z5111 Encounter for antineoplastic chemotherapy: Secondary | ICD-10-CM | POA: Diagnosis not present

## 2020-10-24 DIAGNOSIS — C259 Malignant neoplasm of pancreas, unspecified: Secondary | ICD-10-CM

## 2020-10-24 MED ORDER — SODIUM CHLORIDE 0.9% FLUSH
10.0000 mL | INTRAVENOUS | Status: DC | PRN
  Administered 2020-10-24: 10 mL
  Filled 2020-10-24: qty 10

## 2020-10-24 MED ORDER — HEPARIN SOD (PORK) LOCK FLUSH 100 UNIT/ML IV SOLN
500.0000 [IU] | Freq: Once | INTRAVENOUS | Status: AC | PRN
  Administered 2020-10-24: 500 [IU]
  Filled 2020-10-24: qty 5

## 2020-10-24 MED ORDER — HEPARIN SOD (PORK) LOCK FLUSH 100 UNIT/ML IV SOLN
INTRAVENOUS | Status: AC
  Filled 2020-10-24: qty 5

## 2020-10-30 ENCOUNTER — Ambulatory Visit
Admission: RE | Admit: 2020-10-30 | Discharge: 2020-10-30 | Disposition: A | Discharge status: Home / Self Care | Payer: Medicare Other | Source: Ambulatory Visit | Attending: Oncology | Admitting: Oncology

## 2020-10-30 ENCOUNTER — Other Ambulatory Visit: Payer: Self-pay

## 2020-10-30 DIAGNOSIS — C259 Malignant neoplasm of pancreas, unspecified: Secondary | ICD-10-CM

## 2020-10-30 IMAGING — CT CT ABDOMEN WO/W CM
3 of 7 series · 14 of 32 positions shown, 18 images · IV contrast (omnipaque)
Comparison: MR abdomen, [DATE], CT chest, [DATE]

CLINICAL DATA: Pancreatic cancer

EXAM:
CT CHEST WITH CONTRAST
CT ABDOMEN WITH AND WITHOUT CONTRAST
TECHNIQUE: Multidetector CT imaging of the chest was performed during
intravenous contrast administration. Multidetector CT imaging of the
abdomen was performed following the standard protocol before and
during bolus administration of intravenous contrast.
CONTRAST:  100mL OMNIPAQUE IOHEXOL 300 MG/ML  SOLN

[Series 4: axial arterial · axial · arterial · 0.70mm/px · z∈[-822,-736]mm · 3 of 73 slices shown]
[im 15/73  soft-tissue]
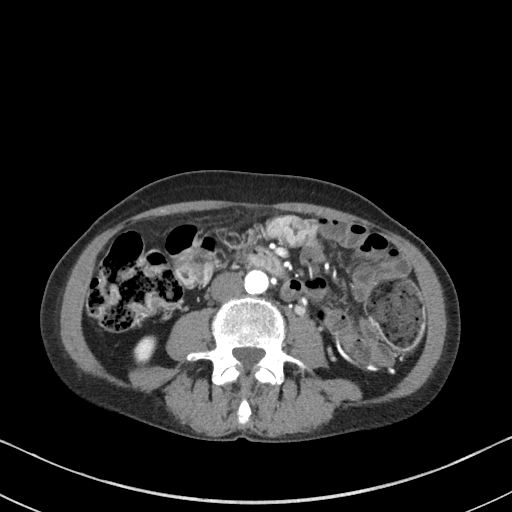
[im 29/73  soft-tissue]
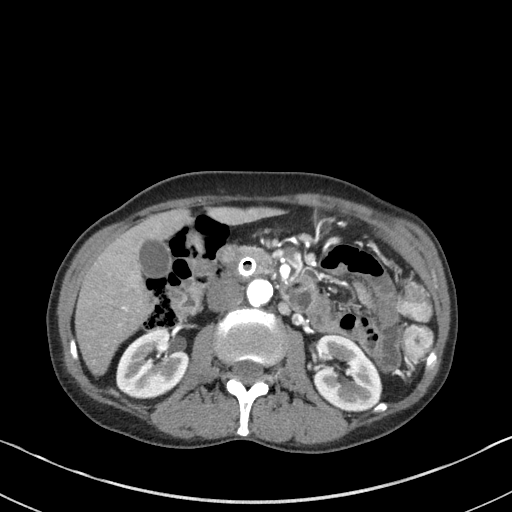
[im 44/73  soft-tissue]
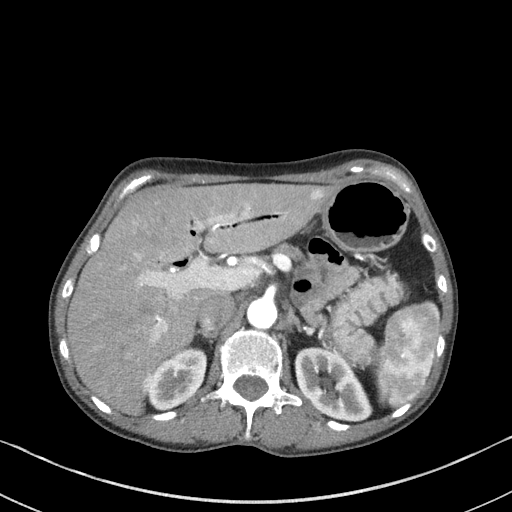

[Series 10: lung portal venous · axial · portal-venous · 0.71mm/px · z∈[-686,-526]mm · 3 of 66 slices shown, 7 images]
[im 17/66  soft-tissue]
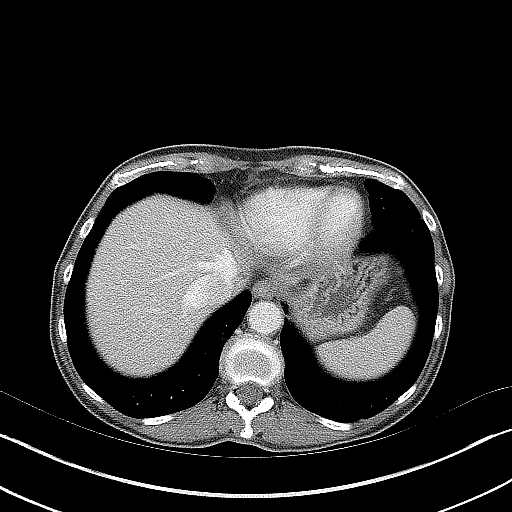
[im 17/66  lung]
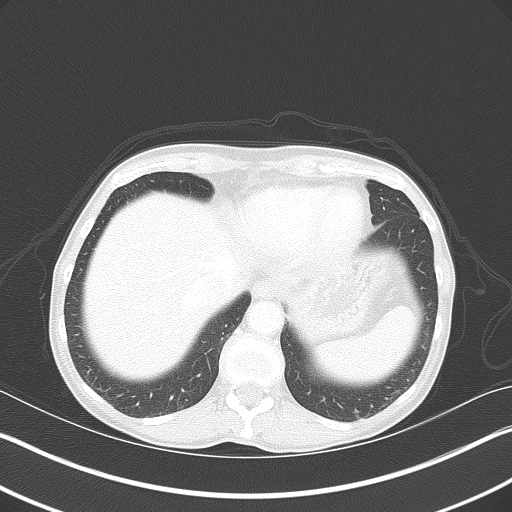
[im 17/66  bone]
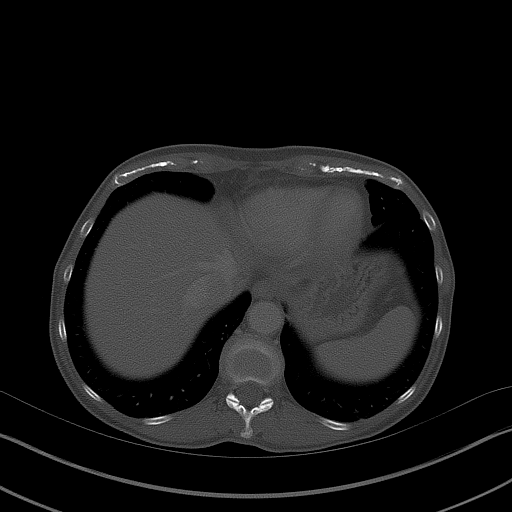
[im 33/66  soft-tissue]
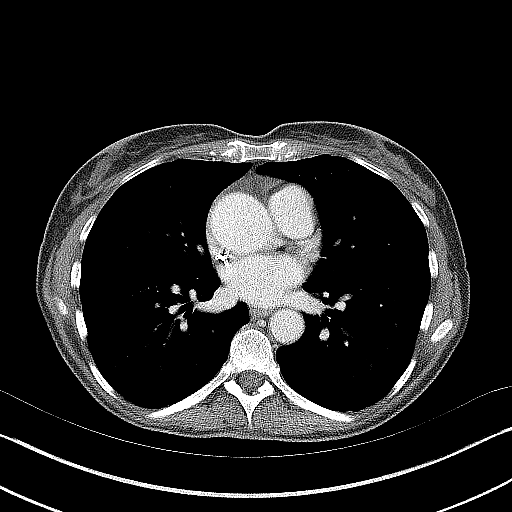
[im 33/66  lung]
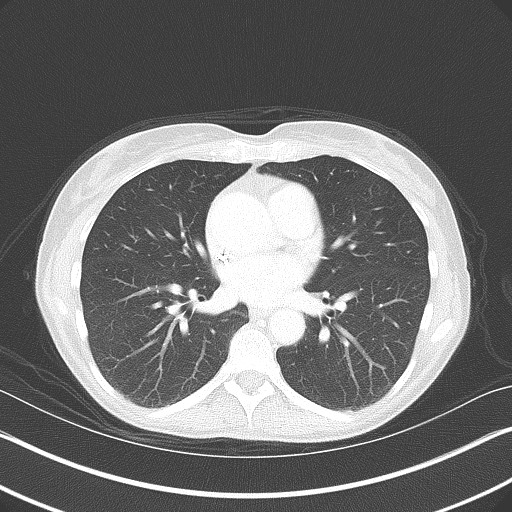
[im 49/66  soft-tissue]
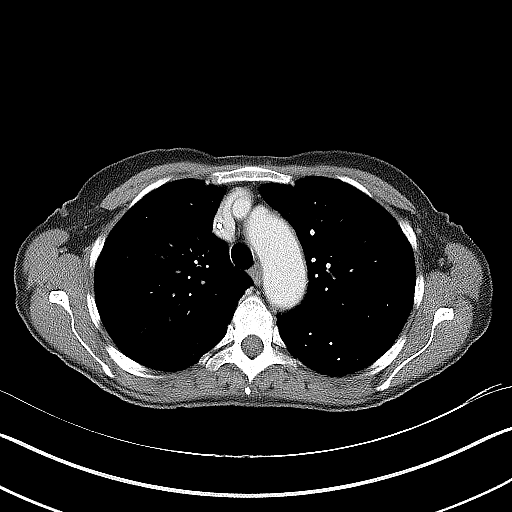
[im 49/66  lung]
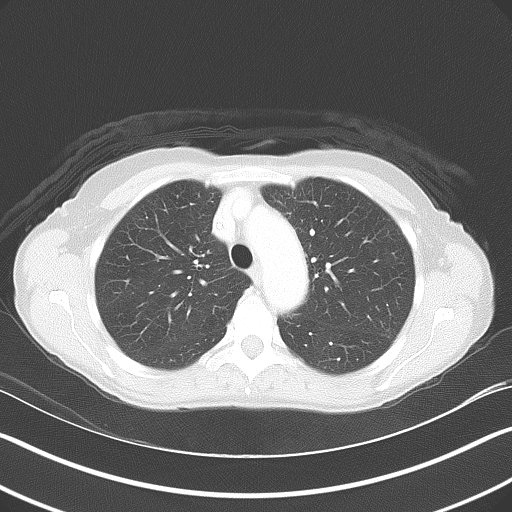

[Series 14: thin axial pancreas · axial · 0.69mm/px · z∈[-834,-486]mm · 8 of 146 slices shown]
[im 15/146  soft-tissue]
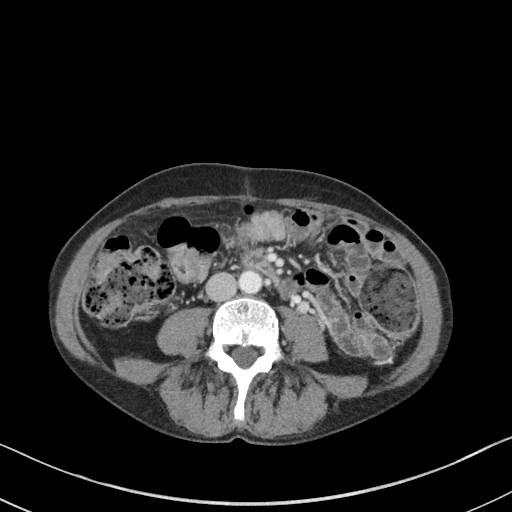
[im 30/146  soft-tissue]
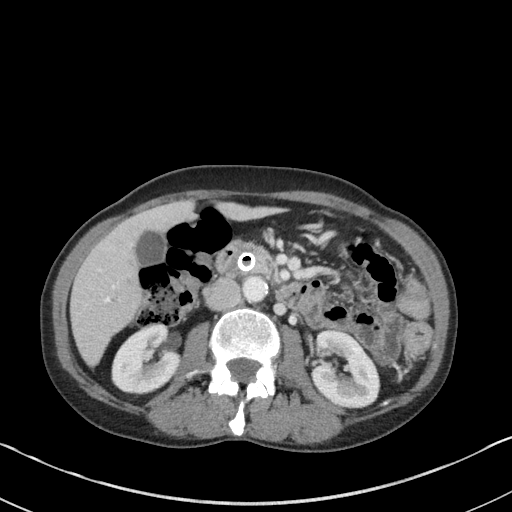
[im 44/146  soft-tissue]
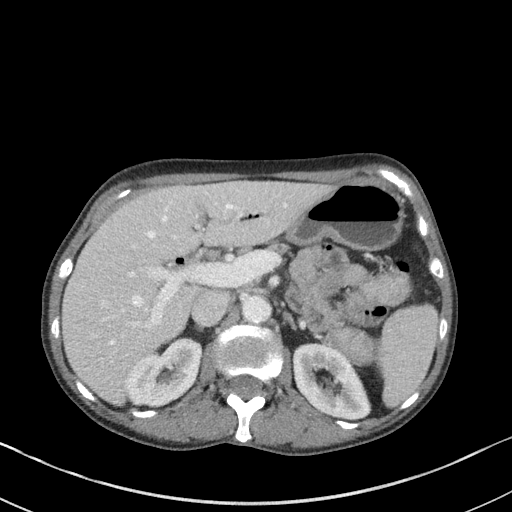
[im 59/146  soft-tissue]
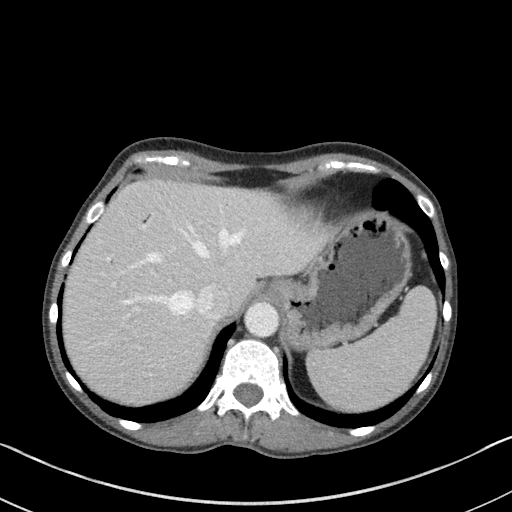
[im 88/146  soft-tissue]
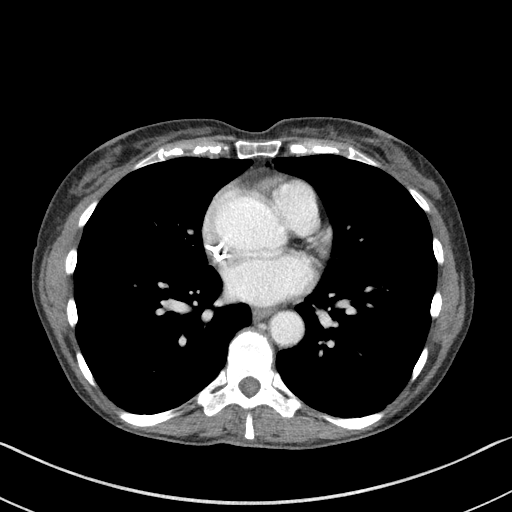
[im 102/146  soft-tissue]
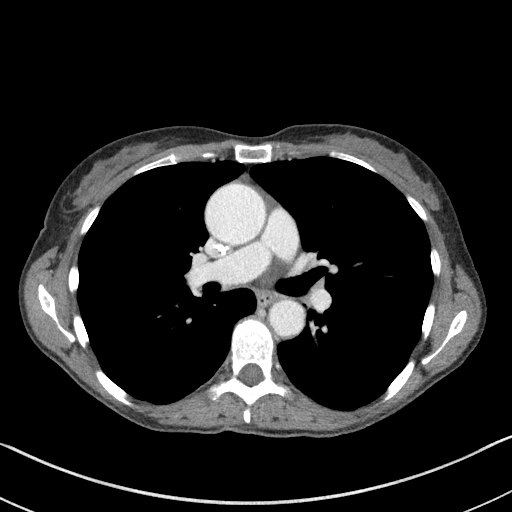
[im 117/146  soft-tissue]
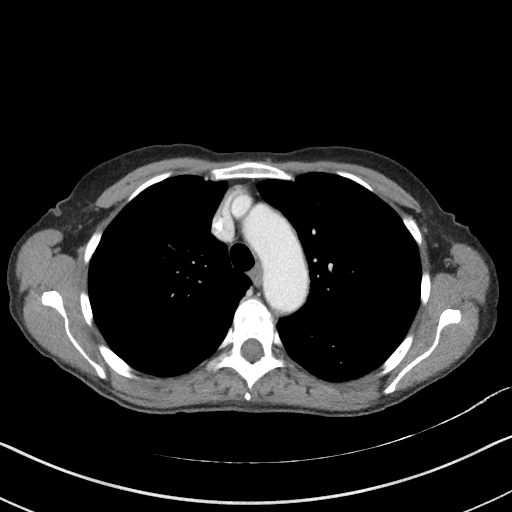
[im 131/146  soft-tissue]
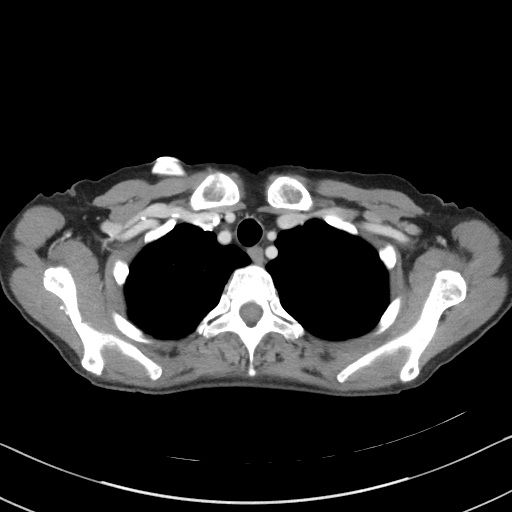

[14 of 32 positions shown; findings below may reference images not displayed]

FINDINGS: CT CHEST FINDINGS

Cardiovascular: Right chest port catheter. Scattered aortic
atherosclerosis. Unchanged enlargement of the tubular ascending
thoracic aorta, measuring up to 4.2 x 4.2 cm. Normal heart size. No
pericardial effusion.

Mediastinum/Nodes: No enlarged mediastinal, hilar, or axillary lymph
nodes. Thyroid gland, trachea, and esophagus demonstrate no
significant findings.

Lungs/Pleura: Lungs are clear. No pleural effusion or pneumothorax.

Musculoskeletal: No chest wall mass or suspicious bone lesions
identified.

CT ABDOMEN FINDINGS

Hepatobiliary: No solid liver abnormality is seen. No gallstones,
gallbladder wall thickening, or biliary dilatation. Redemonstrated
common bile duct stent with post stenting pneumobilia.

Pancreas: Unchanged soft tissue fullness of the pancreatic head
without discrete mass (series 14, image 116). There is mild atrophy
of the distal pancreatic parenchyma. No pancreatic ductal dilatation
or surrounding inflammatory changes.

Spleen: Normal in size without significant abnormality.

Adrenals/Urinary Tract: Adrenal glands are unremarkable. Kidneys are
normal, without renal calculi, solid lesion, or hydronephrosis.

Stomach/Bowel: Stomach is within normal limits. Appendix appears
normal. No evidence of bowel wall thickening, distention, or
inflammatory changes.

Vascular/Lymphatic: Aortic atherosclerosis. No enlarged abdominal
lymph nodes.

Other: No abdominal wall hernia or abnormality. No abdominopelvic
ascites.

Musculoskeletal: No acute or significant osseous findings.
IMPRESSION: 1. Unchanged soft tissue fullness of the pancreatic head without
discrete mass. There is mild atrophy of the distal pancreatic
parenchyma. No pancreatic ductal dilatation or surrounding
inflammatory changes.
2. Redemonstrated common bile duct stent with post stenting
pneumobilia.
3. No evidence of lymphadenopathy or metastatic disease in the chest
or abdomen.
4. Unchanged enlargement of the tubular ascending thoracic aorta,
measuring up to 4.2 x 4.2 cm. Recommend annual imaging followup by
CTA or MRA. This recommendation follows [BW]
ACCF/AHA/AATS/ACR/ASA/SCA/TSANG/TSANG/TSANG/TSANG Guidelines for the
Diagnosis and Management of Patients with Thoracic Aortic Disease.
Circulation. [BW]; 121: [id number]. Aortic aneurysm NOS ([BW]-[BW])

Aortic Atherosclerosis ([BW]-[BW]).

## 2020-10-30 IMAGING — CT CT CHEST W/ CM
3 of 8 series · 13 of 32 positions shown, 18 images · IV contrast (omnipaque)
Comparison: MR abdomen, [DATE], CT chest, [DATE]

CLINICAL DATA: Pancreatic cancer

EXAM:
CT CHEST WITH CONTRAST
CT ABDOMEN WITH AND WITHOUT CONTRAST
TECHNIQUE: Multidetector CT imaging of the chest was performed during
intravenous contrast administration. Multidetector CT imaging of the
abdomen was performed following the standard protocol before and
during bolus administration of intravenous contrast.
CONTRAST:  100mL OMNIPAQUE IOHEXOL 300 MG/ML  SOLN

[Series 4: axial arterial · axial · arterial · 0.70mm/px · z∈[-810,-756]mm · 2 of 73 slices shown]
[im 19/73  soft-tissue]
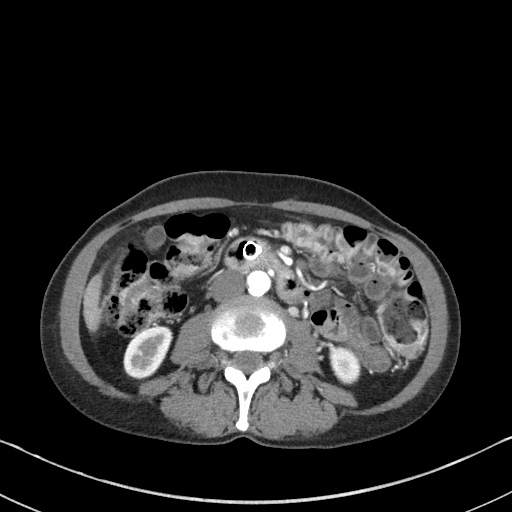
[im 37/73  soft-tissue]
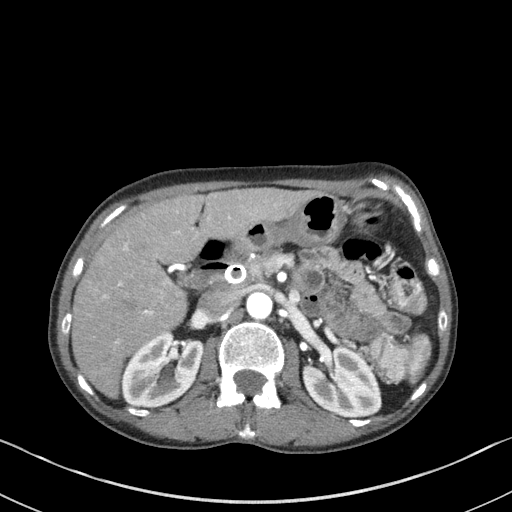

[Series 9: axial venous · axial · portal-venous · 0.69mm/px · z∈[-790,-530]mm · 4 of 88 slices shown, 9 images]
[im 18/88  soft-tissue]
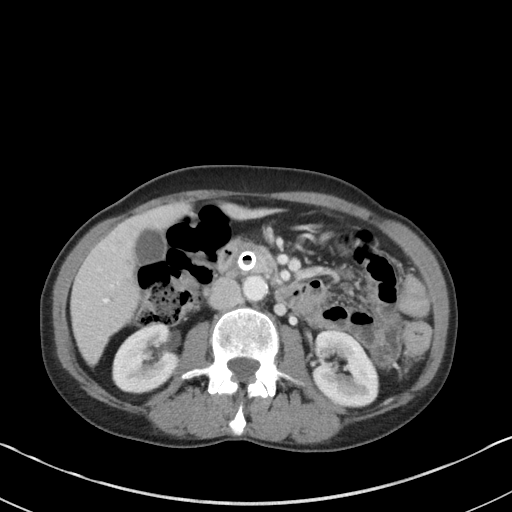
[im 18/88  lung]
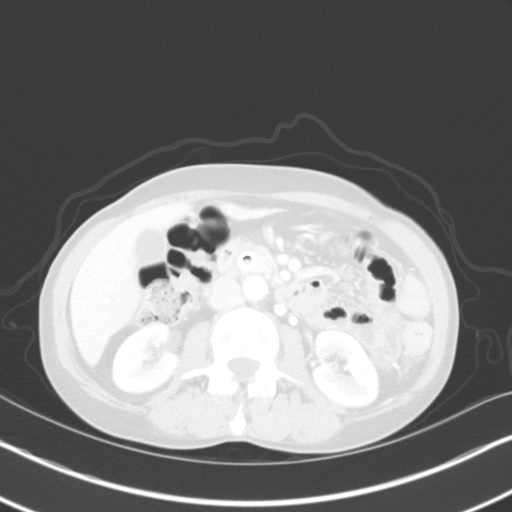
[im 18/88  bone]
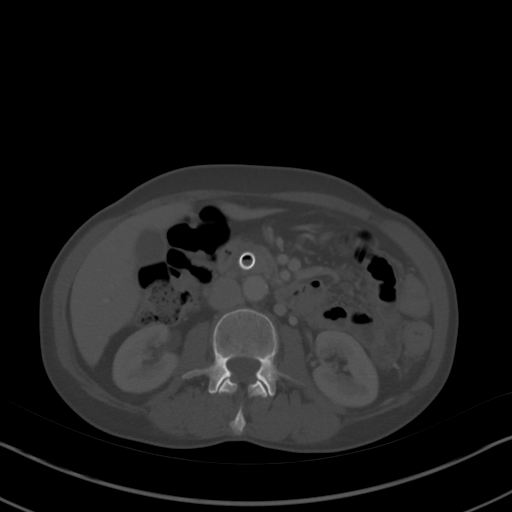
[im 35/88  soft-tissue]
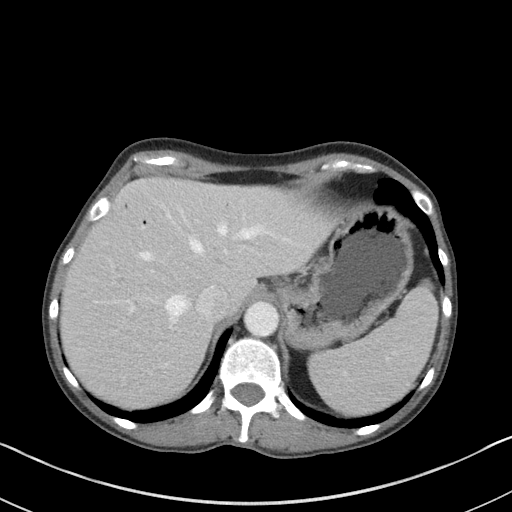
[im 35/88  lung]
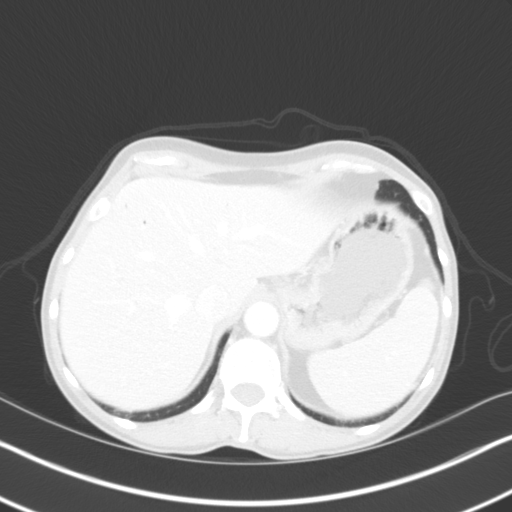
[im 53/88  soft-tissue]
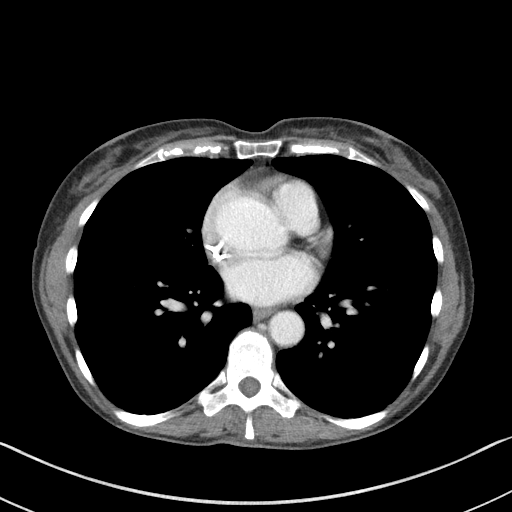
[im 53/88  lung]
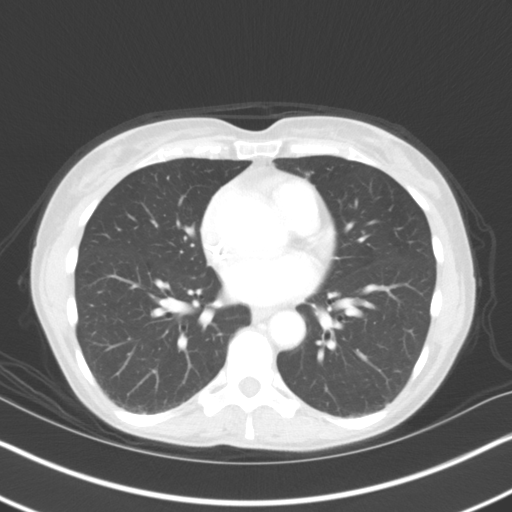
[im 70/88  soft-tissue]
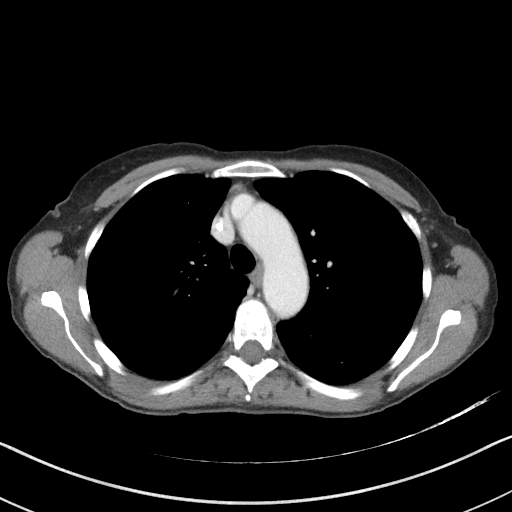
[im 70/88  lung]
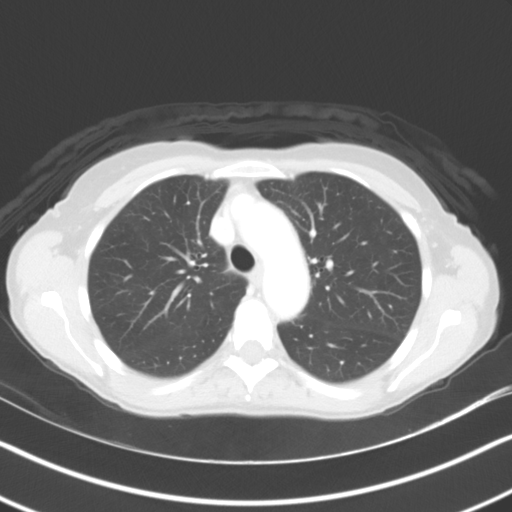

[Series 14: thin axial pancreas · axial · 0.69mm/px · z∈[-822,-498]mm · 7 of 146 slices shown]
[im 19/146  soft-tissue]
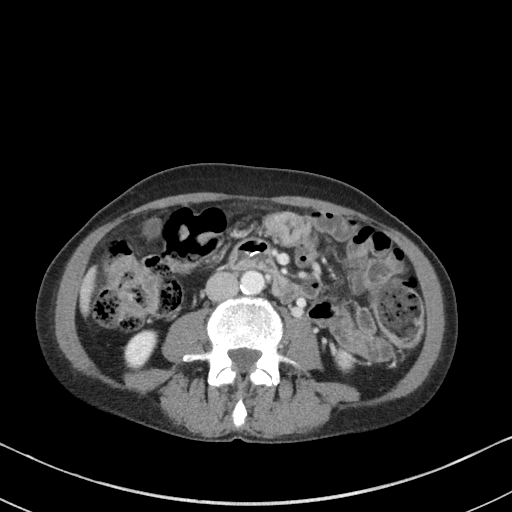
[im 37/146  soft-tissue]
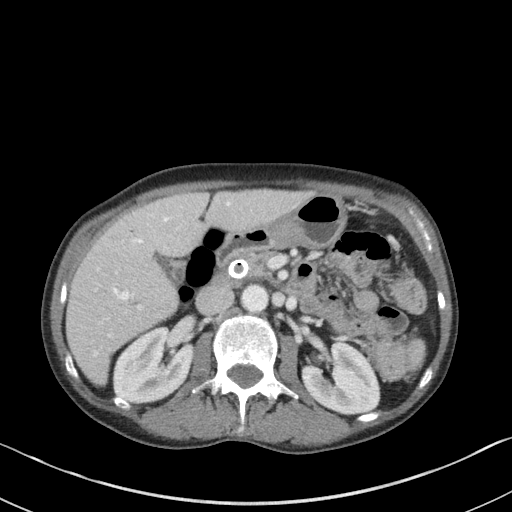
[im 55/146  soft-tissue]
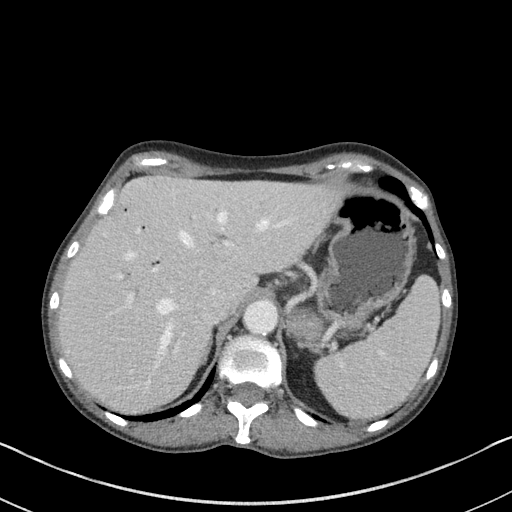
[im 73/146  soft-tissue]
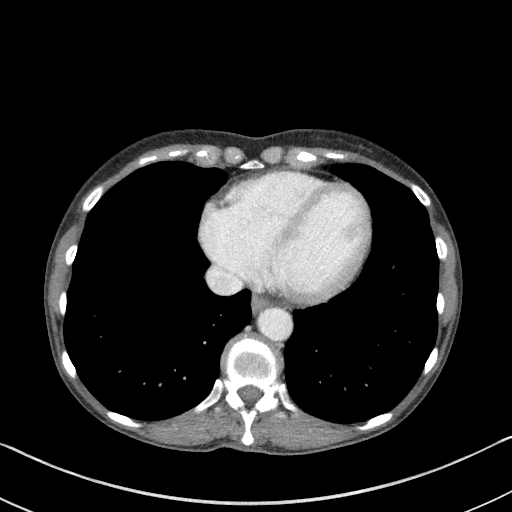
[im 91/146  soft-tissue]
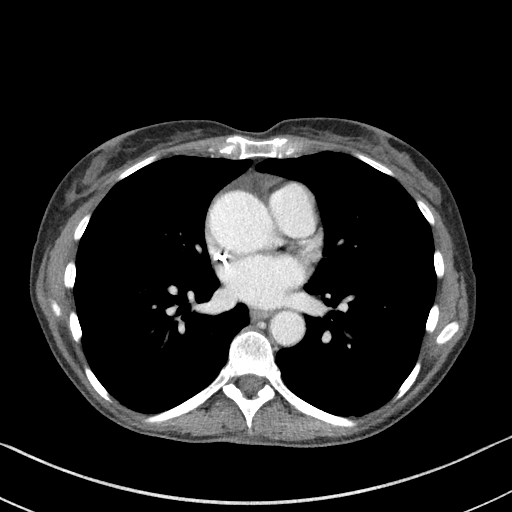
[im 109/146  soft-tissue]
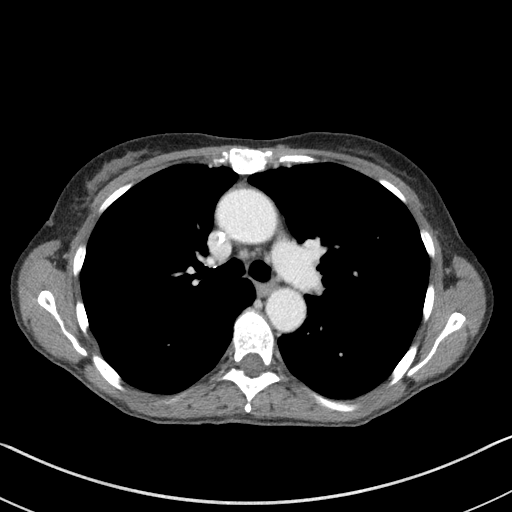
[im 127/146  soft-tissue]
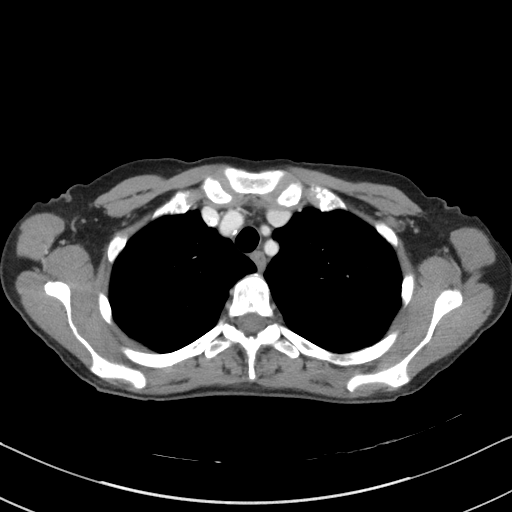

[13 of 32 positions shown; findings below may reference images not displayed]

FINDINGS: CT CHEST FINDINGS

Cardiovascular: Right chest port catheter. Scattered aortic
atherosclerosis. Unchanged enlargement of the tubular ascending
thoracic aorta, measuring up to 4.2 x 4.2 cm. Normal heart size. No
pericardial effusion.

Mediastinum/Nodes: No enlarged mediastinal, hilar, or axillary lymph
nodes. Thyroid gland, trachea, and esophagus demonstrate no
significant findings.

Lungs/Pleura: Lungs are clear. No pleural effusion or pneumothorax.

Musculoskeletal: No chest wall mass or suspicious bone lesions
identified.

CT ABDOMEN FINDINGS

Hepatobiliary: No solid liver abnormality is seen. No gallstones,
gallbladder wall thickening, or biliary dilatation. Redemonstrated
common bile duct stent with post stenting pneumobilia.

Pancreas: Unchanged soft tissue fullness of the pancreatic head
without discrete mass (series 14, image 116). There is mild atrophy
of the distal pancreatic parenchyma. No pancreatic ductal dilatation
or surrounding inflammatory changes.

Spleen: Normal in size without significant abnormality.

Adrenals/Urinary Tract: Adrenal glands are unremarkable. Kidneys are
normal, without renal calculi, solid lesion, or hydronephrosis.

Stomach/Bowel: Stomach is within normal limits. Appendix appears
normal. No evidence of bowel wall thickening, distention, or
inflammatory changes.

Vascular/Lymphatic: Aortic atherosclerosis. No enlarged abdominal
lymph nodes.

Other: No abdominal wall hernia or abnormality. No abdominopelvic
ascites.

Musculoskeletal: No acute or significant osseous findings.
IMPRESSION: 1. Unchanged soft tissue fullness of the pancreatic head without
discrete mass. There is mild atrophy of the distal pancreatic
parenchyma. No pancreatic ductal dilatation or surrounding
inflammatory changes.
2. Redemonstrated common bile duct stent with post stenting
pneumobilia.
3. No evidence of lymphadenopathy or metastatic disease in the chest
or abdomen.
4. Unchanged enlargement of the tubular ascending thoracic aorta,
measuring up to 4.2 x 4.2 cm. Recommend annual imaging followup by
CTA or MRA. This recommendation follows [BW]
ACCF/AHA/AATS/ACR/ASA/SCA/TSANG/TSANG/TSANG/TSANG Guidelines for the
Diagnosis and Management of Patients with Thoracic Aortic Disease.
Circulation. [BW]; 121: [id number]. Aortic aneurysm NOS ([BW]-[BW])

Aortic Atherosclerosis ([BW]-[BW]).

## 2020-10-30 MED ORDER — IOHEXOL 300 MG/ML  SOLN
100.0000 mL | Freq: Once | INTRAMUSCULAR | Status: AC | PRN
  Administered 2020-10-30: 100 mL via INTRAVENOUS

## 2020-11-03 ENCOUNTER — Telehealth: Payer: Self-pay | Admitting: *Deleted

## 2020-11-03 ENCOUNTER — Other Ambulatory Visit: Payer: Self-pay

## 2020-11-03 MED ORDER — CLINDAMYCIN HCL 300 MG PO CAPS: 600.0000 mg | ORAL_CAPSULE | Freq: Once | ORAL | 0 refills | Status: AC

## 2020-11-03 NOTE — Telephone Encounter (Signed)
Patient called stating that her dentist called and has an opening for noon today for dental cleaning and she states that Dr Tasia Catchings said she would need something before she has cleaning she think it is a mouth rinse or something. Please advise

## 2020-11-03 NOTE — Telephone Encounter (Signed)
Call returned to patient this morning and advised that doctor sent iin antibiotics for her to take before the procedure. She thanked me for letting her know

## 2020-11-05 ENCOUNTER — Encounter: Payer: Self-pay | Admitting: Oncology

## 2020-11-05 ENCOUNTER — Inpatient Hospital Stay: Payer: Medicare Other | Attending: Oncology

## 2020-11-05 ENCOUNTER — Inpatient Hospital Stay (HOSPITAL_BASED_OUTPATIENT_CLINIC_OR_DEPARTMENT_OTHER): Payer: Medicare Other | Admitting: Oncology

## 2020-11-05 ENCOUNTER — Inpatient Hospital Stay: Payer: Medicare Other

## 2020-11-05 VITALS — BP 125/78 | HR 64 | Temp 98.8°F | Resp 18 | Wt 123.0 lb

## 2020-11-05 DIAGNOSIS — Z5111 Encounter for antineoplastic chemotherapy: Secondary | ICD-10-CM | POA: Insufficient documentation

## 2020-11-05 DIAGNOSIS — D696 Thrombocytopenia, unspecified: Secondary | ICD-10-CM | POA: Diagnosis not present

## 2020-11-05 DIAGNOSIS — T451X5A Adverse effect of antineoplastic and immunosuppressive drugs, initial encounter: Secondary | ICD-10-CM

## 2020-11-05 DIAGNOSIS — D6481 Anemia due to antineoplastic chemotherapy: Secondary | ICD-10-CM | POA: Diagnosis not present

## 2020-11-05 DIAGNOSIS — C259 Malignant neoplasm of pancreas, unspecified: Secondary | ICD-10-CM

## 2020-11-05 DIAGNOSIS — G620 Drug-induced polyneuropathy: Secondary | ICD-10-CM

## 2020-11-05 DIAGNOSIS — Z452 Encounter for adjustment and management of vascular access device: Secondary | ICD-10-CM | POA: Insufficient documentation

## 2020-11-05 DIAGNOSIS — Z95828 Presence of other vascular implants and grafts: Secondary | ICD-10-CM | POA: Insufficient documentation

## 2020-11-05 DIAGNOSIS — C787 Secondary malignant neoplasm of liver and intrahepatic bile duct: Secondary | ICD-10-CM | POA: Diagnosis present

## 2020-11-05 LAB — COMPREHENSIVE METABOLIC PANEL
ALT: 41 U/L (ref 0–44)
AST: 33 U/L (ref 15–41)
Albumin: 3.7 g/dL (ref 3.5–5.0)
Alkaline Phosphatase: 147 U/L — ABNORMAL HIGH (ref 38–126)
Anion gap: 10 (ref 5–15)
BUN: 15 mg/dL (ref 8–23)
CO2: 24 mmol/L (ref 22–32)
Calcium: 8.6 mg/dL — ABNORMAL LOW (ref 8.9–10.3)
Chloride: 107 mmol/L (ref 98–111)
Creatinine, Ser: 0.63 mg/dL (ref 0.44–1.00)
GFR, Estimated: >60 mL/min (ref >60)
Glucose, Bld: 106 mg/dL — ABNORMAL HIGH (ref 70–99)
Potassium: 3.8 mmol/L (ref 3.5–5.1)
Sodium: 141 mmol/L (ref 135–145)
Total Bilirubin: 0.6 mg/dL (ref 0.3–1.2)
Total Protein: 6.3 g/dL — ABNORMAL LOW (ref 6.5–8.1)

## 2020-11-05 LAB — CBC WITH DIFFERENTIAL/PLATELET
Abs Immature Granulocytes: 0.01 10*3/uL (ref 0.00–0.07)
Basophils Absolute: 0 10*3/uL (ref 0.0–0.1)
Basophils Relative: 1 %
Eosinophils Absolute: 0.1 10*3/uL (ref 0.0–0.5)
Eosinophils Relative: 4 %
HCT: 31 % — ABNORMAL LOW (ref 36.0–46.0)
Hemoglobin: 10.8 g/dL — ABNORMAL LOW (ref 12.0–15.0)
Immature Granulocytes: 0 %
Lymphocytes Relative: 30 %
Lymphs Abs: 0.9 10*3/uL (ref 0.7–4.0)
MCH: 35.3 pg — ABNORMAL HIGH (ref 26.0–34.0)
MCHC: 34.8 g/dL (ref 30.0–36.0)
MCV: 101.3 fL — ABNORMAL HIGH (ref 80.0–100.0)
Monocytes Absolute: 0.4 10*3/uL (ref 0.1–1.0)
Monocytes Relative: 14 %
Neutro Abs: 1.6 10*3/uL — ABNORMAL LOW (ref 1.7–7.7)
Neutrophils Relative %: 51 %
Platelets: 152 10*3/uL (ref 150–400)
RBC: 3.06 MIL/uL — ABNORMAL LOW (ref 3.87–5.11)
RDW: 12.7 % (ref 11.5–15.5)
WBC: 3.1 10*3/uL — ABNORMAL LOW (ref 4.0–10.5)
nRBC: 0 % (ref 0.0–0.2)

## 2020-11-05 MED ORDER — SODIUM CHLORIDE 0.9 % IV SOLN
150.0000 mg | Freq: Once | INTRAVENOUS | Status: AC
  Administered 2020-11-05: 150 mg via INTRAVENOUS
  Filled 2020-11-05: qty 150

## 2020-11-05 MED ORDER — SODIUM CHLORIDE 0.9 % IV SOLN
2400.0000 mg/m2 | INTRAVENOUS | Status: DC
  Administered 2020-11-05: 4000 mg via INTRAVENOUS
  Filled 2020-11-05: qty 80

## 2020-11-05 MED ORDER — DIPHENHYDRAMINE HCL 25 MG PO CAPS: 25.0000 mg | ORAL_CAPSULE | Freq: Once | ORAL | Status: DC

## 2020-11-05 MED ORDER — ATROPINE SULFATE 1 MG/ML IJ SOLN
0.5000 mg | Freq: Once | INTRAMUSCULAR | Status: AC
  Administered 2020-11-05: 0.5 mg via INTRAVENOUS
  Filled 2020-11-05: qty 1

## 2020-11-05 MED ORDER — SODIUM CHLORIDE 0.9% FLUSH
10.0000 mL | Freq: Once | INTRAVENOUS | Status: AC
  Administered 2020-11-05: 10 mL via INTRAVENOUS
  Filled 2020-11-05: qty 10

## 2020-11-05 MED ORDER — SODIUM CHLORIDE 0.9 % IV SOLN
150.0000 mg/m2 | Freq: Once | INTRAVENOUS | Status: AC
  Administered 2020-11-05: 260 mg via INTRAVENOUS
  Filled 2020-11-05: qty 10

## 2020-11-05 MED ORDER — SODIUM CHLORIDE 0.9 % IV SOLN
10.0000 mg | Freq: Once | INTRAVENOUS | Status: AC
  Administered 2020-11-05: 10 mg via INTRAVENOUS
  Filled 2020-11-05: qty 10

## 2020-11-05 MED ORDER — DEXTROSE 5 % IV SOLN
Freq: Once | INTRAVENOUS | Status: AC
  Filled 2020-11-05: qty 250

## 2020-11-05 MED ORDER — SODIUM CHLORIDE 0.9 % IV SOLN
650.0000 mg | Freq: Once | INTRAVENOUS | Status: AC
  Administered 2020-11-05: 650 mg via INTRAVENOUS
  Filled 2020-11-05: qty 32.5

## 2020-11-05 MED ORDER — DIPHENHYDRAMINE HCL 25 MG PO CAPS
25.0000 mg | ORAL_CAPSULE | Freq: Once | ORAL | Status: AC
  Administered 2020-11-05: 25 mg via ORAL
  Filled 2020-11-05: qty 1

## 2020-11-05 MED ORDER — PALONOSETRON HCL INJECTION 0.25 MG/5ML
0.2500 mg | Freq: Once | INTRAVENOUS | Status: AC
  Administered 2020-11-05: 0.25 mg via INTRAVENOUS
  Filled 2020-11-05: qty 5

## 2020-11-05 NOTE — Progress Notes (Signed)
Patient here for oncology follow-up appointment, expresses no new complaints or concerns at this time.   

## 2020-11-05 NOTE — Patient Instructions (Signed)
[facility]  Discharge Instructions: Thank you for choosing [facility] to provide your oncology and hematology care.  If you have a lab appointment with the [facility], please go directly to the [facility] and check in at the registration area.  Wear comfortable clothing and clothing appropriate for easy access to any Portacath or PICC line.   We strive to give you quality time with your provider. You may need to reschedule your appointment if you arrive late (15 or more minutes).  Arriving late affects you and other patients whose appointments are after yours.  Also, if you miss three or more appointments without notifying the office, you may be dismissed from the clinic at the provider's discretion.      For prescription refill requests, have your pharmacy contact our office and allow 72 hours for refills to be completed.    Today you received the following chemotherapy and/or immunotherapy agents Irinotecan, Leucovorin,fluorouracil      To help prevent nausea and vomiting after your treatment, we encourage you to take your nausea medication as directed.  BELOW ARE SYMPTOMS THAT SHOULD BE REPORTED IMMEDIATELY: . *FEVER GREATER THAN 100.4 F (38 C) OR HIGHER . *CHILLS OR SWEATING . *NAUSEA AND VOMITING THAT IS NOT CONTROLLED WITH YOUR NAUSEA MEDICATION . *UNUSUAL SHORTNESS OF BREATH . *UNUSUAL BRUISING OR BLEEDING . *URINARY PROBLEMS (pain or burning when urinating, or frequent urination) . *BOWEL PROBLEMS (unusual diarrhea, constipation, pain near the anus) . TENDERNESS IN MOUTH AND THROAT WITH OR WITHOUT PRESENCE OF ULCERS (sore throat, sores in mouth, or a toothache) . UNUSUAL RASH, SWELLING OR PAIN  . UNUSUAL VAGINAL DISCHARGE OR ITCHING   Items with * indicate a potential emergency and should be followed up as soon as possible or go to the Emergency Department if any problems should occur.  Please show the CHEMOTHERAPY ALERT  CARD or IMMUNOTHERAPY ALERT CARD at check-in to the Emergency Department and triage nurse.  Should you have questions after your visit or need to cancel or reschedule your appointment, please contact [facility]  [phone] and follow the prompts.  Office hours are 8:00 a.m. to 4:30 p.m. Monday - Friday. Please note that voicemails left after 4:00 p.m. may not be returned until the following business day.  We are closed weekends and major holidays. You have access to a nurse at all times for urgent questions. Please call the main number to the clinic [phone] and follow the prompts.  For any non-urgent questions, you may also contact your provider using MyChart. We now offer e-Visits for anyone 74 and older to request care online for non-urgent symptoms. For details visit [email or web address].   Also download the MyChart app! Go to the app store, search "MyChart", open the app, select [facility], and log in with your MyChart username and password.  Due to Covid, a mask is required upon entering the hospital/clinic. If you do not have a mask, one will be given to you upon arrival. For doctor visits, patients may have 1 support person aged 53 or older with them. For treatment visits, patients cannot have anyone with them due to current Covid guidelines and our immunocompromised population. Fluorouracil, 5-FU injection What is this medicine? FLUOROURACIL, 5-FU (flure oh YOOR a sil) is a chemotherapy drug. It slows the growth of cancer cells. This medicine is used to treat many types of cancer like breast cancer, colon or rectal cancer, pancreatic cancer, and stomach  cancer. This medicine may be used for other purposes; ask your health care provider or pharmacist if you have questions. COMMON BRAND NAME(S): Adrucil What should I tell my health care provider before I take this medicine? They need to know if you have any of these conditions:  blood  disorders  dihydropyrimidine dehydrogenase (DPD) deficiency  infection (especially a virus infection such as chickenpox, cold sores, or herpes)  kidney disease  liver disease  malnourished, poor nutrition  recent or ongoing radiation therapy  an unusual or allergic reaction to fluorouracil, other chemotherapy, other medicines, foods, dyes, or preservatives  pregnant or trying to get pregnant  breast-feeding How should I use this medicine? This drug is given as an infusion or injection into a vein. It is administered in a hospital or clinic by a specially trained health care professional. Talk to your pediatrician regarding the use of this medicine in children. Special care may be needed. Overdosage: If you think you have taken too much of this medicine contact a poison control center or emergency room at once. NOTE: This medicine is only for you. Do not share this medicine with others. What if I miss a dose? It is important not to miss your dose. Call your doctor or health care professional if you are unable to keep an appointment. What may interact with this medicine? Do not take this medicine with any of the following medications:  live virus vaccines This medicine may also interact with the following medications:  medicines that treat or prevent blood clots like warfarin, enoxaparin, and dalteparin This list may not describe all possible interactions. Give your health care provider a list of all the medicines, herbs, non-prescription drugs, or dietary supplements you use. Also tell them if you smoke, drink alcohol, or use illegal drugs. Some items may interact with your medicine. What should I watch for while using this medicine? Visit your doctor for checks on your progress. This drug may make you feel generally unwell. This is not uncommon, as chemotherapy can affect healthy cells as well as cancer cells. Report any side effects. Continue your course of treatment even though  you feel ill unless your doctor tells you to stop. In some cases, you may be given additional medicines to help with side effects. Follow all directions for their use. Call your doctor or health care professional for advice if you get a fever, chills or sore throat, or other symptoms of a cold or flu. Do not treat yourself. This drug decreases your body's ability to fight infections. Try to avoid being around people who are sick. This medicine may increase your risk to bruise or bleed. Call your doctor or health care professional if you notice any unusual bleeding. Be careful brushing and flossing your teeth or using a toothpick because you may get an infection or bleed more easily. If you have any dental work done, tell your dentist you are receiving this medicine. Avoid taking products that contain aspirin, acetaminophen, ibuprofen, naproxen, or ketoprofen unless instructed by your doctor. These medicines may hide a fever. Do not become pregnant while taking this medicine. Women should inform their doctor if they wish to become pregnant or think they might be pregnant. There is a potential for serious side effects to an unborn child. Talk to your health care professional or pharmacist for more information. Do not breast-feed an infant while taking this medicine. Men should inform their doctor if they wish to father a child. This medicine may lower sperm  counts. Do not treat diarrhea with over the counter products. Contact your doctor if you have diarrhea that lasts more than 2 days or if it is severe and watery. This medicine can make you more sensitive to the sun. Keep out of the sun. If you cannot avoid being in the sun, wear protective clothing and use sunscreen. Do not use sun lamps or tanning beds/booths. What side effects may I notice from receiving this medicine? Side effects that you should report to your doctor or health care professional as soon as possible:  allergic reactions like skin  rash, itching or hives, swelling of the face, lips, or tongue  low blood counts - this medicine may decrease the number of white blood cells, red blood cells and platelets. You may be at increased risk for infections and bleeding.  signs of infection - fever or chills, cough, sore throat, pain or difficulty passing urine  signs of decreased platelets or bleeding - bruising, pinpoint red spots on the skin, black, tarry stools, blood in the urine  signs of decreased red blood cells - unusually weak or tired, fainting spells, lightheadedness  breathing problems  changes in vision  chest pain  mouth sores  nausea and vomiting  pain, swelling, redness at site where injected  pain, tingling, numbness in the hands or feet  redness, swelling, or sores on hands or feet  stomach pain  unusual bleeding Side effects that usually do not require medical attention (report to your doctor or health care professional if they continue or are bothersome):  changes in finger or toe nails  diarrhea  dry or itchy skin  hair loss  headache  loss of appetite  sensitivity of eyes to the light  stomach upset  unusually teary eyes This list may not describe all possible side effects. Call your doctor for medical advice about side effects. You may report side effects to FDA at 1-800-FDA-1088. Where should I keep my medicine? This drug is given in a hospital or clinic and will not be stored at home. NOTE: This sheet is a summary. It may not cover all possible information. If you have questions about this medicine, talk to your doctor, pharmacist, or health care provider.  2021 Elsevier/Gold Standard (2019-05-15 15:00:03) Leucovorin injection What is this medicine? LEUCOVORIN (loo koe VOR in) is used to prevent or treat the harmful effects of some medicines. This medicine is used to treat anemia caused by a low amount of folic acid in the body. It is also used with 5-fluorouracil (5-FU) to  treat colon cancer. This medicine may be used for other purposes; ask your health care provider or pharmacist if you have questions. What should I tell my health care provider before I take this medicine? They need to know if you have any of these conditions:  anemia from low levels of vitamin B-12 in the blood  an unusual or allergic reaction to leucovorin, folic acid, other medicines, foods, dyes, or preservatives  pregnant or trying to get pregnant  breast-feeding How should I use this medicine? This medicine is for injection into a muscle or into a vein. It is given by a health care professional in a hospital or clinic setting. Talk to your pediatrician regarding the use of this medicine in children. Special care may be needed. Overdosage: If you think you have taken too much of this medicine contact a poison control center or emergency room at once. NOTE: This medicine is only for you. Do not share  this medicine with others. What if I miss a dose? This does not apply. What may interact with this medicine?  capecitabine  fluorouracil  phenobarbital  phenytoin  primidone  trimethoprim-sulfamethoxazole This list may not describe all possible interactions. Give your health care provider a list of all the medicines, herbs, non-prescription drugs, or dietary supplements you use. Also tell them if you smoke, drink alcohol, or use illegal drugs. Some items may interact with your medicine. What should I watch for while using this medicine? Your condition will be monitored carefully while you are receiving this medicine. This medicine may increase the side effects of 5-fluorouracil, 5-FU. Tell your doctor or health care professional if you have diarrhea or mouth sores that do not get better or that get worse. What side effects may I notice from receiving this medicine? Side effects that you should report to your doctor or health care professional as soon as possible:  allergic  reactions like skin rash, itching or hives, swelling of the face, lips, or tongue  breathing problems  fever, infection  mouth sores  unusual bleeding or bruising  unusually weak or tired Side effects that usually do not require medical attention (report to your doctor or health care professional if they continue or are bothersome):  constipation or diarrhea  loss of appetite  nausea, vomiting This list may not describe all possible side effects. Call your doctor for medical advice about side effects. You may report side effects to FDA at 1-800-FDA-1088. Where should I keep my medicine? This drug is given in a hospital or clinic and will not be stored at home. NOTE: This sheet is a summary. It may not cover all possible information. If you have questions about this medicine, talk to your doctor, pharmacist, or health care provider.  2021 Elsevier/Gold Standard (2007-12-19 16:50:29) Irinotecan injection What is this medicine? IRINOTECAN (ir in oh TEE kan ) is a chemotherapy drug. It is used to treat colon and rectal cancer. This medicine may be used for other purposes; ask your health care provider or pharmacist if you have questions. COMMON BRAND NAME(S): Camptosar What should I tell my health care provider before I take this medicine? They need to know if you have any of these conditions:  dehydration  diarrhea  infection (especially a virus infection such as chickenpox, cold sores, or herpes)  liver disease  low blood counts, like low white cell, platelet, or red cell counts  low levels of calcium, magnesium, or potassium in the blood  recent or ongoing radiation therapy  an unusual or allergic reaction to irinotecan, other medicines, foods, dyes, or preservatives  pregnant or trying to get pregnant  breast-feeding How should I use this medicine? This drug is given as an infusion into a vein. It is administered in a hospital or clinic by a specially trained  health care professional. Talk to your pediatrician regarding the use of this medicine in children. Special care may be needed. Overdosage: If you think you have taken too much of this medicine contact a poison control center or emergency room at once. NOTE: This medicine is only for you. Do not share this medicine with others. What if I miss a dose? It is important not to miss your dose. Call your doctor or health care professional if you are unable to keep an appointment. What may interact with this medicine? Do not take this medicine with any of the following medications:  cobicistat  itraconazole This medicine may interact with the  following medications:  antiviral medicines for HIV or AIDS  certain antibiotics like rifampin or rifabutin  certain medicines for fungal infections like ketoconazole, posaconazole, and voriconazole  certain medicines for seizures like carbamazepine, phenobarbital, phenotoin  clarithromycin  gemfibrozil  nefazodone  St. John's Wort This list may not describe all possible interactions. Give your health care provider a list of all the medicines, herbs, non-prescription drugs, or dietary supplements you use. Also tell them if you smoke, drink alcohol, or use illegal drugs. Some items may interact with your medicine. What should I watch for while using this medicine? Your condition will be monitored carefully while you are receiving this medicine. You will need important blood work done while you are taking this medicine. This drug may make you feel generally unwell. This is not uncommon, as chemotherapy can affect healthy cells as well as cancer cells. Report any side effects. Continue your course of treatment even though you feel ill unless your doctor tells you to stop. In some cases, you may be given additional medicines to help with side effects. Follow all directions for their use. You may get drowsy or dizzy. Do not drive, use machinery, or do  anything that needs mental alertness until you know how this medicine affects you. Do not stand or sit up quickly, especially if you are an older patient. This reduces the risk of dizzy or fainting spells. Call your health care professional for advice if you get a fever, chills, or sore throat, or other symptoms of a cold or flu. Do not treat yourself. This medicine decreases your body's ability to fight infections. Try to avoid being around people who are sick. Avoid taking products that contain aspirin, acetaminophen, ibuprofen, naproxen, or ketoprofen unless instructed by your doctor. These medicines may hide a fever. This medicine may increase your risk to bruise or bleed. Call your doctor or health care professional if you notice any unusual bleeding. Be careful brushing and flossing your teeth or using a toothpick because you may get an infection or bleed more easily. If you have any dental work done, tell your dentist you are receiving this medicine. Do not become pregnant while taking this medicine or for 6 months after stopping it. Women should inform their health care professional if they wish to become pregnant or think they might be pregnant. Men should not father a child while taking this medicine and for 3 months after stopping it. There is potential for serious side effects to an unborn child. Talk to your health care professional for more information. Do not breast-feed an infant while taking this medicine or for 7 days after stopping it. This medicine has caused ovarian failure in some women. This medicine may make it more difficult to get pregnant. Talk to your health care professional if you are concerned about your fertility. This medicine has caused decreased sperm counts in some men. This may make it more difficult to father a child. Talk to your health care professional if you are concerned about your fertility. What side effects may I notice from receiving this medicine? Side  effects that you should report to your doctor or health care professional as soon as possible:  allergic reactions like skin rash, itching or hives, swelling of the face, lips, or tongue  chest pain  diarrhea  flushing, runny nose, sweating during infusion  low blood counts - this medicine may decrease the number of white blood cells, red blood cells and platelets. You may be at increased  risk for infections and bleeding.  nausea, vomiting  pain, swelling, warmth in the leg  signs of decreased platelets or bleeding - bruising, pinpoint red spots on the skin, black, tarry stools, blood in the urine  signs of infection - fever or chills, cough, sore throat, pain or difficulty passing urine  signs of decreased red blood cells - unusually weak or tired, fainting spells, lightheadedness Side effects that usually do not require medical attention (report to your doctor or health care professional if they continue or are bothersome):  constipation  hair loss  headache  loss of appetite  mouth sores  stomach pain This list may not describe all possible side effects. Call your doctor for medical advice about side effects. You may report side effects to FDA at 1-800-FDA-1088. Where should I keep my medicine? This drug is given in a hospital or clinic and will not be stored at home. NOTE: This sheet is a summary. It may not cover all possible information. If you have questions about this medicine, talk to your doctor, pharmacist, or health care provider.  2021 Elsevier/Gold Standard (2019-05-15 17:46:13)

## 2020-11-05 NOTE — Progress Notes (Signed)
Hematology/Oncology follow up note [facility] Telephone:(336) [phone] [phone]   Patient Care Team: Flinchum, Kelby Aline, FNP as PCP - General (Family Medicine) Flinchum, Kelby Aline, FNP (Family Medicine) [name], RN as Oncology Nurse Navigator [name], MD as Consulting Physician (Oncology)  REFERRING PROVIDER: Sharmon Leyden*  CHIEF COMPLAINTS/REASON FOR VISIT:  Follow up for treatment of pancreatic adenocarcinoma  HISTORY OF PRESENTING ILLNESS:   Cassandra [name] is a  65 y.o.  female with PMH listed below was seen in consultation at the request of  Flinchum, Michelle S, F*  for evaluation of pancreatic adenocarcinoma Patient initially presented with jaundice, transaminitis, bilirubin was 9.9.  CA 19-9 was 1874.  Patient also reports unintentional weight loss. 02/08/2020 MRI abdomen and MRCP with and without contrast was done at [facility] which showed pancreatic head mass measuring up to 3 cm, with marked associated narrowing of the portal vein confluence.  SMA is preserved.  Marked intrahepatic and extrahepatic biliary duct dilatation as well as mild dilatation of the main pancreatic duct. Multiple hepatic masses highly concerning for metastatic disease.  Patient underwent EUS on 02/19/2020, which showed irregular mass identified in the pancreatic head, hypoechoic, measured 66mmx33mm, sonographic evidence concerning for invasion into the superior mesenteric artery.  There is no sign of significant abnormality in the main pancreatic duct.  Dilatation of common bile duct which measured up to 16 mm.  Region of celiac artery was visualized and showed no signs of significant abnormality.  No lymphadenopathy.  FNA showed adenocarcinoma.  02/19/2020, ERCP, malignant.  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  The stricture was treated with placement of wall flex metal stent.  Patient was  seen by [facility] oncology Dr. Pia Mau and was recommended for 3 drug regimen FOLFIRINOX.  Patient prefers to do chemotherapy locally at [facility].  Patient was referred to establish care today. She denies any pain.  Since stent placement, skin jaundice has improved.  Itchiness has also improved. Patient was accompanied by her husband today.  She has a family history of breast cancer in sister and paternal aunt, colon cancer paternal grandmother.  #no reportable targetable mutation on NGS 9/14/2021cycle 1 FOLFIRINOX.  Patient received oxaliplatin and about 50% of Irinotecan on day 1 and had experienced neurologic symptoms.  She went to ER and working diagnosis is TIA. # 03/19/2020-03/21/2020 patient was admitted due to sepsis with strep pneumonia bacteremia.  Patient was treated with IV Rocephin.  TEE was done which showed no vegetation.  No PFO or ASD.  Patient was discharged home and he finished full course of 14 days of IV Rocephin on 04/02/2020 per ID recommendation.  Repeat blood culture was also negative.  08/25/20 cycle 10 FOLFIRINOX 09/08/20- present  Starting cycle 11, FOLFIRI, Oxaliplatin discontinued due to neuropathy   #NGS showed no reportable targetable mutation #Genetic testing-[facility] diagnostic testing showed no pathological variants identified. MS stable, TMB 78mut/mb, KRAS G12D, SF3B1 K700E, TP53 V233fs*30  INTERVAL HISTORY Cassandra [name] is a 65 y.o. female who has above history reviewed by me today presents for evaluation prior to chemotherapy. Problems and complaints are listed below Patient reports feeling well since the discontinuation of oxaliplatin.  No new complaints.  She had a CT scan done during interval No nausea vomiting diarrhea    Review of Systems  Constitutional: Negative for appetite change, chills, fatigue, fever and unexpected weight change.  HENT:   Negative for hearing  loss and voice change.   Eyes: Negative for eye problems.  Respiratory: Negative for chest  tightness and cough.   Cardiovascular: Negative for chest pain.  Gastrointestinal: Negative for abdominal distention, abdominal pain and blood in stool.  Endocrine: Negative for hot flashes.  Genitourinary: Negative for difficulty urinating and frequency.   Musculoskeletal: Negative for arthralgias.  Skin: Negative for itching and rash.  Neurological: Positive for numbness. Negative for extremity weakness.  Hematological: Negative for adenopathy.  Psychiatric/Behavioral: Negative for confusion.    MEDICAL HISTORY:  Past Medical History:  Diagnosis Date  . Cancer [facility])    pancreatic cancer  . Colon polyps   . Family history of breast cancer   . Neutropenia ([place]) 04/18/2020  . Osteopenia after menopause 05/2017   femoral neck T score -2.0    SURGICAL HISTORY: Past Surgical History:  Procedure Laterality Date  . COLONOSCOPY  07/2015   WNL  . COLONOSCOPY  2008/2011  . OVARIAN CYST REMOVAL  1992   dermoid-Dr Alger  . PORTA CATH INSERTION N/A 03/07/2020   Procedure: PORTA CATH INSERTION;  Surgeon: Algernon Huxley, MD;  Location: [place] [facility];  Service: Cardiovascular;  Laterality: N/A;  . TEE WITHOUT CARDIOVERSION N/A 03/21/2020   Procedure: TRANSESOPHAGEAL ECHOCARDIOGRAM (TEE);  Surgeon: Dionisio David, MD;  Location: [facility] ORS;  Service: Cardiovascular;  Laterality: N/A;  . TUBAL LIGATION  1993    SOCIAL HISTORY: Social History   Socioeconomic History  . Marital status: Married    Spouse name: Not on file  . Number of children: 2  . Years of education: Not on file  . Highest education level: Not on file  Occupational History  . Occupation: Pharmacist, hospital    Comment: retired  . Occupation: Farmer  Tobacco Use  . Smoking status: Former Smoker    Packs/day: 0.50    Years: 10.00    Pack years: 5.00    Types: Cigarettes    Quit date: 1987    Years since quitting: 35.3  . Smokeless tobacco: Never Used  . Tobacco comment: Quit smoking 1987  Vaping Use  . Vaping Use:  Never used  Substance and Sexual Activity  . Alcohol use: Not Currently    Comment: 0-2 mixed drinks a day  . Drug use: No  . Sexual activity: Not Currently    Birth control/protection: Post-menopausal  Other Topics Concern  . Not on file  Social History Narrative  . Not on file   Social Determinants of Health   Financial Resource Strain: Not on file  Food Insecurity: Not on file  Transportation Needs: Not on file  Physical Activity: Not on file  Stress: Not on file  Social Connections: Not on file  Intimate Partner Violence: Not on file    FAMILY HISTORY: Family History  Problem Relation Age of Onset  . Breast cancer Paternal Aunt 40  . Diabetes Mother   . Osteoporosis Mother   . Hyperlipidemia Father   . Rheumatic fever Father   . Valvular heart disease Father   . Cancer Paternal Grandmother        possible colon  . Breast cancer Sister 57    ALLERGIES:  is allergic to penicillin g.  MEDICATIONS:  Current Outpatient Medications  Medication Sig Dispense Refill  . atorvastatin (LIPITOR) 40 MG tablet Take 1 tablet (40 mg total) by mouth daily. 30 tablet 3  . Calcium Carbonate-Vit D-Min (CALCIUM 1200 PO) Take by mouth.    . Cholecalciferol 25 MCG (1000 UT)  tablet Take 2,000 Units by mouth daily.    Marland Kitchen lidocaine-prilocaine (EMLA) cream Apply to affected area once (Patient taking differently: Apply 1 application topically as directed.) 30 g 3  . loperamide (IMODIUM A-D) 2 MG tablet Take 2 at onset of diarrhea, then 1 every 2hrs until 12hr without a BM. May take 2 tab every 4hrs at bedtime. If diarrhea recurs repeat. (Patient not taking: No sig reported) 100 tablet 1  . magic mouthwash w/lidocaine SOLN Take 5 mLs by mouth 4 (four) times daily as needed for mouth pain. Sig: Swish & Spit 5-10 ml four times a day as needed. Dispense 480 ml. 1RF (Patient not taking: No sig reported) 480 mL 1  . ondansetron (ZOFRAN) 8 MG tablet Take 1 tablet (8 mg total) by mouth 2 (two) times  daily as needed. Start on day 3 after chemotherapy. (Patient not taking: No sig reported) 30 tablet 1  . potassium chloride SA (KLOR-CON M20) 20 MEQ tablet Take 1 tablet (20 mEq total) by mouth daily. 30 tablet 0  . prochlorperazine (COMPAZINE) 10 MG tablet Take 1 tablet (10 mg total) by mouth every 6 (six) hours as needed (Nausea or vomiting). (Patient not taking: Reported on 10/22/2020) 30 tablet 1  . simethicone (GAS-X) 80 MG chewable tablet Chew 1 tablet (80 mg total) by mouth every 8 (eight) hours as needed for flatulence. 60 tablet 0   No current facility-administered medications for this visit.   Facility-Administered Medications Ordered in Other Visits  Medication Dose Route Frequency Provider Last Rate Last Admin  . sodium chloride flush (NS) 0.9 % injection 10 mL  10 mL Intravenous Once [name], MD         PHYSICAL EXAMINATION: ECOG PERFORMANCE STATUS: 1 - Symptomatic but completely ambulatory Vitals:   11/05/20 0836  BP: 125/78  Pulse: 64  Resp: 18  Temp: 98.8 F (37.1 C)  SpO2: 100%   Filed Weights   11/05/20 0836  Weight: 123 lb (55.8 kg)    Physical Exam Constitutional:      General: She is not in acute distress. HENT:     Head: Normocephalic and atraumatic.  Eyes:     General: No scleral icterus. Cardiovascular:     Rate and Rhythm: Normal rate and regular rhythm.     Heart sounds: Normal heart sounds.  Pulmonary:     Effort: Pulmonary effort is normal. No respiratory distress.     Breath sounds: No wheezing.  Abdominal:     General: Bowel sounds are normal. There is no distension.     Palpations: Abdomen is soft.  Musculoskeletal:        General: No deformity. Normal range of motion.     Cervical back: Normal range of motion and neck supple.  Skin:    General: Skin is warm and dry.     Coloration: Skin is not jaundiced.     Findings: No erythema or rash.  Neurological:     Mental Status: She is alert and oriented to person, place, and time. Mental  status is at baseline.     Cranial Nerves: No cranial nerve deficit.     Coordination: Coordination normal.  Psychiatric:        Mood and Affect: Mood normal.     LABORATORY DATA:  I have reviewed the data as listed Lab Results  Component Value Date   WBC 3.5 (L) 10/22/2020   HGB 10.7 (L) 10/22/2020   HCT 31.3 (L) 10/22/2020   MCV 103.0 (  H) 10/22/2020   PLT 151 10/22/2020   Recent Labs    02/06/20 1049 02/29/20 1314 03/19/20 1024 03/20/20 0411 03/21/20 0318 04/04/20 0827 09/22/20 0816 10/08/20 0810 10/22/20 0810  NA 137   < > 129* 136 140   < > 143 144 140  K 4.1   < > 3.8 4.0 3.7   < > 3.6 3.9 4.1  CL 99   < > 95* 104 108   < > 109 110 106  CO2 23   < > $R'25 25 25   'AL$ < > $R'25 24 23  'hB$ GLUCOSE 113*   < > 145* 126* 114*   < > 112* 119* 96  BUN 11   < > 11 7* 6*   < > $R'10 14 14  'yf$ CREATININE 0.80   < > 0.85 0.66 0.52   < > 0.67 0.63 0.74  CALCIUM 10.2   < > 8.9 8.3* 8.2*   < > 8.6* 8.7* 8.8*  GFRNONAA 78   < > >60 >60 >60   < > >60 >60 >60  GFRAA 90   < > >60 >60 >60  --   --   --   --   PROT 7.0   < > 7.1  --  5.3*   < > 5.9* 6.0* 6.1*  ALBUMIN 4.9*   < > 3.5  --  2.5*   < > 3.8 3.8 3.7  AST 195*   < > 30  --  52*   < > 49* 35 34  ALT 366*   < > 43  --  74*   < > 48* 34 34  ALKPHOS 934*   < > 125  --  112   < > 231* 146* 144*  BILITOT 9.9*   < > 1.5*  --  1.0   < > 0.4 0.6 0.6  BILIDIR 8.11*  --   --   --   --   --   --   --   --   IBILI 1.79*  --   --   --   --   --   --   --   --    < > = values in this interval not displayed.   Iron/TIBC/Ferritin/ %Sat No results found for: IRON, TIBC, FERRITIN, IRONPCTSAT    RADIOGRAPHIC STUDIES: I have personally reviewed the radiological images as listed and agreed with the findings in the report. Reviewed findings of MRI abdomen MRCP done at [facility]. CT CHEST W CONTRAST  Result Date: 10/31/2020 CLINICAL DATA:  Pancreatic cancer EXAM: CT CHEST WITH CONTRAST CT ABDOMEN WITH AND WITHOUT CONTRAST TECHNIQUE: Multidetector CT imaging of  the chest was performed during intravenous contrast administration. Multidetector CT imaging of the abdomen was performed following the standard protocol before and during bolus administration of intravenous contrast. CONTRAST:  15mL OMNIPAQUE IOHEXOL 300 MG/ML  SOLN COMPARISON:  MR abdomen, 06/11/2020, CT chest, 03/13/2020 FINDINGS: CT CHEST FINDINGS Cardiovascular: Right chest port catheter. Scattered aortic atherosclerosis. 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 Normal heart size. No pericardial effusion. Mediastinum/Nodes: No enlarged mediastinal, hilar, or axillary lymph nodes. Thyroid gland, trachea, and esophagus demonstrate no significant findings. Lungs/Pleura: Lungs are clear. No pleural effusion or pneumothorax. Musculoskeletal: No chest wall mass or suspicious bone lesions identified. CT ABDOMEN FINDINGS Hepatobiliary: No solid liver abnormality is seen. No gallstones, gallbladder wall thickening, or biliary dilatation. Redemonstrated common bile duct stent with post stenting  pneumobilia. Pancreas: Unchanged soft tissue fullness of the pancreatic head without discrete mass (series 14, image 116). There is mild atrophy of the distal pancreatic parenchyma. No pancreatic ductal dilatation or surrounding inflammatory changes. Spleen: Normal in size without significant abnormality. Adrenals/Urinary Tract: Adrenal glands are unremarkable. Kidneys are normal, without renal calculi, solid lesion, or hydronephrosis. Stomach/Bowel: Stomach is within normal limits. Appendix appears normal. No evidence of bowel wall thickening, distention, or inflammatory changes. Vascular/Lymphatic: Aortic atherosclerosis. No enlarged abdominal lymph nodes. Other: No abdominal wall hernia or abnormality. No abdominopelvic ascites. Musculoskeletal: No acute or significant osseous findings. IMPRESSION: 1. Unchanged soft tissue fullness of the pancreatic head without discrete  mass. There is mild atrophy of the distal pancreatic parenchyma. No pancreatic ductal dilatation or surrounding inflammatory changes. 2. Redemonstrated common bile duct stent with post stenting pneumobilia. 3. No evidence of lymphadenopathy or metastatic disease in the chest or abdomen. 4. 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 Recommend annual imaging followup by CTA or MRA. This recommendation follows 2010 ACCF/AHA/AATS/ACR/ASA/SCA/SCAI/SIR/STS/SVM Guidelines for the Diagnosis and Management of Patients with Thoracic Aortic Disease. Circulation. 2010; 121: B638-G536. Aortic aneurysm NOS (ICD10-I71.9) Aortic Atherosclerosis (ICD10-I70.0). Electronically Signed   By: Eddie Candle M.D.   On: 10/31/2020 11:59   CT PANCREAS ABD W/WO  Result Date: 10/31/2020 CLINICAL DATA:  Pancreatic cancer EXAM: CT CHEST WITH CONTRAST CT ABDOMEN WITH AND WITHOUT CONTRAST TECHNIQUE: Multidetector CT imaging of the chest was performed during intravenous contrast administration. Multidetector CT imaging of the abdomen was performed following the standard protocol before and during bolus administration of intravenous contrast. CONTRAST:  132mL OMNIPAQUE IOHEXOL 300 MG/ML  SOLN COMPARISON:  MR abdomen, 06/11/2020, CT chest, 03/13/2020 FINDINGS: CT CHEST FINDINGS Cardiovascular: Right chest port catheter. Scattered aortic atherosclerosis. 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 Normal heart size. No pericardial effusion. Mediastinum/Nodes: No enlarged mediastinal, hilar, or axillary lymph nodes. Thyroid gland, trachea, and esophagus demonstrate no significant findings. Lungs/Pleura: Lungs are clear. No pleural effusion or pneumothorax. Musculoskeletal: No chest wall mass or suspicious bone lesions identified. CT ABDOMEN FINDINGS Hepatobiliary: No solid liver abnormality is seen. No gallstones, gallbladder wall thickening, or biliary  dilatation. Redemonstrated common bile duct stent with post stenting pneumobilia. Pancreas: Unchanged soft tissue fullness of the pancreatic head without discrete mass (series 14, image 116). There is mild atrophy of the distal pancreatic parenchyma. No pancreatic ductal dilatation or surrounding inflammatory changes. Spleen: Normal in size without significant abnormality. Adrenals/Urinary Tract: Adrenal glands are unremarkable. Kidneys are normal, without renal calculi, solid lesion, or hydronephrosis. Stomach/Bowel: Stomach is within normal limits. Appendix appears normal. No evidence of bowel wall thickening, distention, or inflammatory changes. Vascular/Lymphatic: Aortic atherosclerosis. No enlarged abdominal lymph nodes. Other: No abdominal wall hernia or abnormality. No abdominopelvic ascites. Musculoskeletal: No acute or significant osseous findings. IMPRESSION: 1. Unchanged soft tissue fullness of the pancreatic head without discrete mass. There is mild atrophy of the distal pancreatic parenchyma. No pancreatic ductal dilatation or surrounding inflammatory changes. 2. Redemonstrated common bile duct stent with post stenting pneumobilia. 3. No evidence of lymphadenopathy or metastatic disease in the chest or abdomen. 4. 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 Recommend annual imaging followup by CTA or MRA. This recommendation follows 2010 ACCF/AHA/AATS/ACR/ASA/SCA/SCAI/SIR/STS/SVM Guidelines for the Diagnosis and Management of Patients with Thoracic Aortic Disease. Circulation. 2010; 121: I680-H212. Aortic aneurysm NOS (ICD10-I71.9) Aortic Atherosclerosis (ICD10-I70.0).  Electronically Signed   By: Eddie Candle M.D.   On: 10/31/2020 11:59     ASSESSMENT & PLAN:  1. Primary pancreatic cancer ([place])   2. Encounter for antineoplastic chemotherapy   3. Anemia due to antineoplastic chemotherapy   4. Thrombocytopenia ([place])   5. Chemotherapy-induced  neuropathy ([place])   6. 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 Interval CT chest abdomen pelvis with contrast showed stable disease.  No new progression or metastasis. Proceed with FOLFIRI today.  #Transaminitis, improved.  Off Lipitor.  Trending down.  Monitor. #Irinotecan-associated dysarthria, lip/tongue numbness stable symptoms Irinotecan to be infused over 180 minutes.  Atropine 0.5 mg once prior to the irinotecan.  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   Patient follows up with neurology for management..  Continue acupuncture.  #Chemotherapy-induced anemia.  Hemoglobin is stable at 10.8 monitor closely.  Supportive care measures are necessary for patient well-being and will be provided as necessary. We spent sufficient time to discuss many aspect of care, questions were answered to patient's satisfaction.   All questions were answered. The patient knows to call the clinic with any problems questions or concerns.  Return of visit: 2 weeks  [name], MD, PhD Hematology Oncology [facility] Pager- [phone] 11/05/2020

## 2020-11-06 LAB — CANCER ANTIGEN 19-9: CA 19-9: 7 U/mL (ref 0–35)

## 2020-11-07 ENCOUNTER — Inpatient Hospital Stay: Payer: Medicare Other

## 2020-11-07 VITALS — BP 133/75 | HR 52 | Temp 97.0°F | Resp 18

## 2020-11-07 DIAGNOSIS — Z5111 Encounter for antineoplastic chemotherapy: Secondary | ICD-10-CM | POA: Diagnosis not present

## 2020-11-07 DIAGNOSIS — C259 Malignant neoplasm of pancreas, unspecified: Secondary | ICD-10-CM

## 2020-11-07 MED ORDER — HEPARIN SOD (PORK) LOCK FLUSH 100 UNIT/ML IV SOLN
500.0000 [IU] | Freq: Once | INTRAVENOUS | Status: AC | PRN
  Administered 2020-11-07: 500 [IU]
  Filled 2020-11-07: qty 5

## 2020-11-07 MED ORDER — SODIUM CHLORIDE 0.9% FLUSH
10.0000 mL | INTRAVENOUS | Status: DC | PRN
  Administered 2020-11-07: 10 mL
  Filled 2020-11-07: qty 10

## 2020-11-07 MED ORDER — HEPARIN SOD (PORK) LOCK FLUSH 100 UNIT/ML IV SOLN
INTRAVENOUS | Status: AC
  Filled 2020-11-07: qty 5

## 2020-11-10 ENCOUNTER — Other Ambulatory Visit: Payer: Self-pay

## 2020-11-10 ENCOUNTER — Ambulatory Visit (INDEPENDENT_AMBULATORY_CARE_PROVIDER_SITE_OTHER): Payer: Medicare Other | Admitting: Family Medicine

## 2020-11-10 VITALS — BP 114/77 | HR 67 | Ht 67.0 in | Wt 120.0 lb

## 2020-11-10 DIAGNOSIS — Z1231 Encounter for screening mammogram for malignant neoplasm of breast: Secondary | ICD-10-CM

## 2020-11-10 NOTE — Patient Instructions (Addendum)
.   I recommend that you get the Prevnar-20 vaccine to protect yourself from certain strains of pneumonia. Please call our office at [phone] at your earliest convenience to schedule this vaccine.   . Please call the [facility] [phone]) to schedule a routine screening mammogram.

## 2020-11-10 NOTE — Progress Notes (Signed)
Established patient visit   Patient: Cassandra [name]   DOB: [birth year]   65 y.o. Female  MRN: [id number] Visit Date: 11/10/2020  Today's healthcare provider: Lelon Huh, MD   No chief complaint on file.  Subjective    HPI  Patient presents to discuss getting a mammogram referral. She was previously followed at [facility] but plans on having all her gyn care done here from now on. She was previously a patient of [facility], but reports she has a positive opinion of Dr. B who she saw last fall and wishes to stay at this practice. She currently remains on chemotherapy for pancreatic cancer.      Medications: Outpatient Medications Prior to Visit  Medication Sig  . Calcium Carbonate-Vit D-Min (CALCIUM 1200 PO) Take by mouth.  . Cholecalciferol 25 MCG (1000 UT) tablet Take 2,000 Units by mouth daily.  Marland Kitchen lidocaine-prilocaine (EMLA) cream Apply to affected area once (Patient taking differently: Apply 1 application topically as directed.)  . loperamide (IMODIUM A-D) 2 MG tablet Take 2 at onset of diarrhea, then 1 every 2hrs until 12hr without a BM. May take 2 tab every 4hrs at bedtime. If diarrhea recurs repeat.  . magic mouthwash w/lidocaine SOLN Take 5 mLs by mouth 4 (four) times daily as needed for mouth pain. Sig: Swish & Spit 5-10 ml four times a day as needed. Dispense 480 ml. 1RF  . Multiple Vitamins-Minerals (MULTIVITAMIN WITH MINERALS) tablet Take 1 tablet by mouth daily.  . ondansetron (ZOFRAN) 8 MG tablet Take 1 tablet (8 mg total) by mouth 2 (two) times daily as needed. Start on day 3 after chemotherapy.  . potassium chloride SA (KLOR-CON M20) 20 MEQ tablet Take 1 tablet (20 mEq total) by mouth daily.  . prochlorperazine (COMPAZINE) 10 MG tablet Take 1 tablet (10 mg total) by mouth every 6 (six) hours as needed (Nausea or vomiting).  . simethicone (GAS-X) 80 MG chewable tablet Chew 1 tablet (80 mg total) by mouth every 8 (eight) hours as needed for flatulence.  Marland Kitchen  atorvastatin (LIPITOR) 40 MG tablet Take 1 tablet (40 mg total) by mouth daily. (Patient not taking: Reported on 11/10/2020)   No facility-administered medications prior to visit.        Objective    BP 114/77 (BP Location: Right Arm, Patient Position: Sitting, Cuff Size: Small)   Pulse 67   Ht 5\' 7"  (1.702 m)   Wt 120 lb (54.4 kg)   BMI 18.79 kg/m     Physical Exam  General appearance: Thin female, cooperative and in no acute distress Head: Normocephalic, without obvious abnormality, atraumatic Respiratory: Respirations even and unlabored, normal respiratory rate Extremities: All extremities are intact.  Skin: Skin color, texture, turgor normal. No rashes seen  Psych: Appropriate mood and affect. Neurologic: Mental status: Alert, oriented to person, place, and time, thought content appropriate.    Assessment & Plan     1. Encounter for screening mammogram for malignant neoplasm of breast  - MM 3D SCREEN BREAST BILATERAL; Future    Advised she is due for Prevnar-20. She is going to discuss with her oncologist.      The entirety of the information documented in the History of Present Illness, Review of Systems and Physical Exam were personally obtained by me. Portions of this information were initially documented by the CMA and reviewed by me for thoroughness and accuracy.      Lelon Huh, MD  [facility] [phone] (phone) [phone] (  fax)  [place]

## 2020-11-19 ENCOUNTER — Inpatient Hospital Stay: Payer: Medicare Other

## 2020-11-19 ENCOUNTER — Inpatient Hospital Stay (HOSPITAL_BASED_OUTPATIENT_CLINIC_OR_DEPARTMENT_OTHER): Payer: Medicare Other | Admitting: Oncology

## 2020-11-19 ENCOUNTER — Encounter: Payer: Self-pay | Admitting: Oncology

## 2020-11-19 ENCOUNTER — Other Ambulatory Visit: Payer: Self-pay

## 2020-11-19 VITALS — BP 128/80 | HR 60 | Temp 97.7°F | Resp 18 | Wt 124.0 lb

## 2020-11-19 DIAGNOSIS — D6481 Anemia due to antineoplastic chemotherapy: Secondary | ICD-10-CM

## 2020-11-19 DIAGNOSIS — C259 Malignant neoplasm of pancreas, unspecified: Secondary | ICD-10-CM

## 2020-11-19 DIAGNOSIS — Z5111 Encounter for antineoplastic chemotherapy: Secondary | ICD-10-CM | POA: Diagnosis not present

## 2020-11-19 DIAGNOSIS — T451X5A Adverse effect of antineoplastic and immunosuppressive drugs, initial encounter: Secondary | ICD-10-CM | POA: Diagnosis not present

## 2020-11-19 DIAGNOSIS — G620 Drug-induced polyneuropathy: Secondary | ICD-10-CM

## 2020-11-19 LAB — COMPREHENSIVE METABOLIC PANEL
ALT: 38 U/L (ref 0–44)
AST: 51 U/L — ABNORMAL HIGH (ref 15–41)
Albumin: 4 g/dL (ref 3.5–5.0)
Alkaline Phosphatase: 109 U/L (ref 38–126)
Anion gap: 10 (ref 5–15)
BUN: 11 mg/dL (ref 8–23)
CO2: 25 mmol/L (ref 22–32)
Calcium: 8.7 mg/dL — ABNORMAL LOW (ref 8.9–10.3)
Chloride: 106 mmol/L (ref 98–111)
Creatinine, Ser: 0.65 mg/dL (ref 0.44–1.00)
GFR, Estimated: >60 mL/min (ref >60)
Glucose, Bld: 96 mg/dL (ref 70–99)
Potassium: 4 mmol/L (ref 3.5–5.1)
Sodium: 141 mmol/L (ref 135–145)
Total Bilirubin: 0.6 mg/dL (ref 0.3–1.2)
Total Protein: 6.3 g/dL — ABNORMAL LOW (ref 6.5–8.1)

## 2020-11-19 LAB — CBC WITH DIFFERENTIAL/PLATELET
Abs Immature Granulocytes: 0.01 10*3/uL (ref 0.00–0.07)
Basophils Absolute: 0 10*3/uL (ref 0.0–0.1)
Basophils Relative: 1 %
Eosinophils Absolute: 0.1 10*3/uL (ref 0.0–0.5)
Eosinophils Relative: 3 %
HCT: 32 % — ABNORMAL LOW (ref 36.0–46.0)
Hemoglobin: 11 g/dL — ABNORMAL LOW (ref 12.0–15.0)
Immature Granulocytes: 0 %
Lymphocytes Relative: 36 %
Lymphs Abs: 1.1 10*3/uL (ref 0.7–4.0)
MCH: 34.8 pg — ABNORMAL HIGH (ref 26.0–34.0)
MCHC: 34.4 g/dL (ref 30.0–36.0)
MCV: 101.3 fL — ABNORMAL HIGH (ref 80.0–100.0)
Monocytes Absolute: 0.5 10*3/uL (ref 0.1–1.0)
Monocytes Relative: 15 %
Neutro Abs: 1.4 10*3/uL — ABNORMAL LOW (ref 1.7–7.7)
Neutrophils Relative %: 45 %
Platelets: 155 10*3/uL (ref 150–400)
RBC: 3.16 MIL/uL — ABNORMAL LOW (ref 3.87–5.11)
RDW: 12.8 % (ref 11.5–15.5)
WBC: 3.1 10*3/uL — ABNORMAL LOW (ref 4.0–10.5)
nRBC: 0 % (ref 0.0–0.2)

## 2020-11-19 MED ORDER — DEXAMETHASONE SODIUM PHOSPHATE 100 MG/10ML IJ SOLN
10.0000 mg | Freq: Once | INTRAMUSCULAR | Status: AC
  Administered 2020-11-19: 10 mg via INTRAVENOUS
  Filled 2020-11-19: qty 10

## 2020-11-19 MED ORDER — SODIUM CHLORIDE 0.9 % IV SOLN
150.0000 mg | Freq: Once | INTRAVENOUS | Status: AC
  Administered 2020-11-19: 150 mg via INTRAVENOUS
  Filled 2020-11-19: qty 150

## 2020-11-19 MED ORDER — SODIUM CHLORIDE 0.9% FLUSH
10.0000 mL | Freq: Once | INTRAVENOUS | Status: AC
Start: 2020-11-19 — End: 2020-11-19
  Administered 2020-11-19: 10 mL via INTRAVENOUS
  Filled 2020-11-19: qty 10

## 2020-11-19 MED ORDER — SODIUM CHLORIDE 0.9 % IV SOLN
Freq: Once | INTRAVENOUS | Status: AC
  Filled 2020-11-19: qty 250

## 2020-11-19 MED ORDER — SODIUM CHLORIDE 0.9 % IV SOLN
150.0000 mg/m2 | Freq: Once | INTRAVENOUS | Status: AC
  Administered 2020-11-19: 260 mg via INTRAVENOUS
  Filled 2020-11-19: qty 10

## 2020-11-19 MED ORDER — SODIUM CHLORIDE 0.9 % IV SOLN
2400.0000 mg/m2 | INTRAVENOUS | Status: DC
  Administered 2020-11-19: 4000 mg via INTRAVENOUS
  Filled 2020-11-19: qty 80

## 2020-11-19 MED ORDER — PALONOSETRON HCL INJECTION 0.25 MG/5ML
0.2500 mg | Freq: Once | INTRAVENOUS | Status: AC
  Administered 2020-11-19: 0.25 mg via INTRAVENOUS
  Filled 2020-11-19: qty 5

## 2020-11-19 MED ORDER — SODIUM CHLORIDE 0.9 % IV SOLN
650.0000 mg | Freq: Once | INTRAVENOUS | Status: AC
  Administered 2020-11-19: 650 mg via INTRAVENOUS
  Filled 2020-11-19: qty 25

## 2020-11-19 MED ORDER — DIPHENHYDRAMINE HCL 25 MG PO CAPS
25.0000 mg | ORAL_CAPSULE | Freq: Once | ORAL | Status: AC
  Administered 2020-11-19: 25 mg via ORAL
  Filled 2020-11-19: qty 1

## 2020-11-19 MED ORDER — ATROPINE SULFATE 1 MG/ML IJ SOLN
0.5000 mg | Freq: Once | INTRAMUSCULAR | Status: AC
  Administered 2020-11-19: 0.5 mg via INTRAVENOUS
  Filled 2020-11-19: qty 1

## 2020-11-19 NOTE — Patient Instructions (Signed)
CANCER CENTER [facility] REGIONAL MEDICAL ONCOLOGY    Fluorouracil, 5-FU injection What is this medicine? FLUOROURACIL, 5-FU (flure oh YOOR a sil) is a chemotherapy drug. It slows the growth of cancer cells. This medicine is used to treat many types of cancer like breast cancer, colon or rectal cancer, pancreatic cancer, and stomach cancer. This medicine may be used for other purposes; ask your health care provider or pharmacist if you have questions. COMMON BRAND NAME(S): Adrucil What should I tell my health care provider before I take this medicine? They need to know if you have any of these conditions:  blood disorders  dihydropyrimidine dehydrogenase (DPD) deficiency  infection (especially a virus infection such as chickenpox, cold sores, or herpes)  kidney disease  liver disease  malnourished, poor nutrition  recent or ongoing radiation therapy  an unusual or allergic reaction to fluorouracil, other chemotherapy, other medicines, foods, dyes, or preservatives  pregnant or trying to get pregnant  breast-feeding How should I use this medicine? This drug is given as an infusion or injection into a vein. It is administered in a hospital or clinic by a specially trained health care professional. Talk to your pediatrician regarding the use of this medicine in children. Special care may be needed. Overdosage: If you think you have taken too much of this medicine contact a poison control center or emergency room at once. NOTE: This medicine is only for you. Do not share this medicine with others. What if I miss a dose? It is important not to miss your dose. Call your doctor or health care professional if you are unable to keep an appointment. What may interact with this medicine? Do not take this medicine with any of the following medications:  live virus vaccines This medicine may also interact with the following medications:  medicines that treat or prevent blood clots like  warfarin, enoxaparin, and dalteparin This list may not describe all possible interactions. Give your health care provider a list of all the medicines, herbs, non-prescription drugs, or dietary supplements you use. Also tell them if you smoke, drink alcohol, or use illegal drugs. Some items may interact with your medicine. What should I watch for while using this medicine? Visit your doctor for checks on your progress. This drug may make you feel generally unwell. This is not uncommon, as chemotherapy can affect healthy cells as well as cancer cells. Report any side effects. Continue your course of treatment even though you feel ill unless your doctor tells you to stop. In some cases, you may be given additional medicines to help with side effects. Follow all directions for their use. Call your doctor or health care professional for advice if you get a fever, chills or sore throat, or other symptoms of a cold or flu. Do not treat yourself. This drug decreases your body's ability to fight infections. Try to avoid being around people who are sick. This medicine may increase your risk to bruise or bleed. Call your doctor or health care professional if you notice any unusual bleeding. Be careful brushing and flossing your teeth or using a toothpick because you may get an infection or bleed more easily. If you have any dental work done, tell your dentist you are receiving this medicine. Avoid taking products that contain aspirin, acetaminophen, ibuprofen, naproxen, or ketoprofen unless instructed by your doctor. These medicines may hide a fever. Do not become pregnant while taking this medicine. Women should inform their doctor if they wish to become pregnant or  think they might be pregnant. There is a potential for serious side effects to an unborn child. Talk to your health care professional or pharmacist for more information. Do not breast-feed an infant while taking this medicine. Men should inform their  doctor if they wish to father a child. This medicine may lower sperm counts. Do not treat diarrhea with over the counter products. Contact your doctor if you have diarrhea that lasts more than 2 days or if it is severe and watery. This medicine can make you more sensitive to the sun. Keep out of the sun. If you cannot avoid being in the sun, wear protective clothing and use sunscreen. Do not use sun lamps or tanning beds/booths. What side effects may I notice from receiving this medicine? Side effects that you should report to your doctor or health care professional as soon as possible:  allergic reactions like skin rash, itching or hives, swelling of the face, lips, or tongue  low blood counts - this medicine may decrease the number of white blood cells, red blood cells and platelets. You may be at increased risk for infections and bleeding.  signs of infection - fever or chills, cough, sore throat, pain or difficulty passing urine  signs of decreased platelets or bleeding - bruising, pinpoint red spots on the skin, black, tarry stools, blood in the urine  signs of decreased red blood cells - unusually weak or tired, fainting spells, lightheadedness  breathing problems  changes in vision  chest pain  mouth sores  nausea and vomiting  pain, swelling, redness at site where injected  pain, tingling, numbness in the hands or feet  redness, swelling, or sores on hands or feet  stomach pain  unusual bleeding Side effects that usually do not require medical attention (report to your doctor or health care professional if they continue or are bothersome):  changes in finger or toe nails  diarrhea  dry or itchy skin  hair loss  headache  loss of appetite  sensitivity of eyes to the light  stomach upset  unusually teary eyes This list may not describe all possible side effects. Call your doctor for medical advice about side effects. You may report side effects to FDA at  1-800-FDA-1088. Where should I keep my medicine? This drug is given in a hospital or clinic and will not be stored at home. NOTE: This sheet is a summary. It may not cover all possible information. If you have questions about this medicine, talk to your doctor, pharmacist, or health care provider.  2021 Elsevier/Gold Standard (2019-05-15 15:00:03) Irinotecan injection What is this medicine? IRINOTECAN (ir in oh TEE kan ) is a chemotherapy drug. It is used to treat colon and rectal cancer. This medicine may be used for other purposes; ask your health care provider or pharmacist if you have questions. COMMON BRAND NAME(S): Camptosar What should I tell my health care provider before I take this medicine? They need to know if you have any of these conditions:  dehydration  diarrhea  infection (especially a virus infection such as chickenpox, cold sores, or herpes)  liver disease  low blood counts, like low white cell, platelet, or red cell counts  low levels of calcium, magnesium, or potassium in the blood  recent or ongoing radiation therapy  an unusual or allergic reaction to irinotecan, other medicines, foods, dyes, or preservatives  pregnant or trying to get pregnant  breast-feeding How should I use this medicine? This drug is given as an  infusion into a vein. It is administered in a hospital or clinic by a specially trained health care professional. Talk to your pediatrician regarding the use of this medicine in children. Special care may be needed. Overdosage: If you think you have taken too much of this medicine contact a poison control center or emergency room at once. NOTE: This medicine is only for you. Do not share this medicine with others. What if I miss a dose? It is important not to miss your dose. Call your doctor or health care professional if you are unable to keep an appointment. What may interact with this medicine? Do not take this medicine with any of the  following medications:  cobicistat  itraconazole This medicine may interact with the following medications:  antiviral medicines for HIV or AIDS  certain antibiotics like rifampin or rifabutin  certain medicines for fungal infections like ketoconazole, posaconazole, and voriconazole  certain medicines for seizures like carbamazepine, phenobarbital, phenotoin  clarithromycin  gemfibrozil  nefazodone  St. John's Wort This list may not describe all possible interactions. Give your health care provider a list of all the medicines, herbs, non-prescription drugs, or dietary supplements you use. Also tell them if you smoke, drink alcohol, or use illegal drugs. Some items may interact with your medicine. What should I watch for while using this medicine? Your condition will be monitored carefully while you are receiving this medicine. You will need important blood work done while you are taking this medicine. This drug may make you feel generally unwell. This is not uncommon, as chemotherapy can affect healthy cells as well as cancer cells. Report any side effects. Continue your course of treatment even though you feel ill unless your doctor tells you to stop. In some cases, you may be given additional medicines to help with side effects. Follow all directions for their use. You may get drowsy or dizzy. Do not drive, use machinery, or do anything that needs mental alertness until you know how this medicine affects you. Do not stand or sit up quickly, especially if you are an older patient. This reduces the risk of dizzy or fainting spells. Call your health care professional for advice if you get a fever, chills, or sore throat, or other symptoms of a cold or flu. Do not treat yourself. This medicine decreases your body's ability to fight infections. Try to avoid being around people who are sick. Avoid taking products that contain aspirin, acetaminophen, ibuprofen, naproxen, or ketoprofen unless  instructed by your doctor. These medicines may hide a fever. This medicine may increase your risk to bruise or bleed. Call your doctor or health care professional if you notice any unusual bleeding. Be careful brushing and flossing your teeth or using a toothpick because you may get an infection or bleed more easily. If you have any dental work done, tell your dentist you are receiving this medicine. Do not become pregnant while taking this medicine or for 6 months after stopping it. Women should inform their health care professional if they wish to become pregnant or think they might be pregnant. Men should not father a child while taking this medicine and for 3 months after stopping it. There is potential for serious side effects to an unborn child. Talk to your health care professional for more information. Do not breast-feed an infant while taking this medicine or for 7 days after stopping it. This medicine has caused ovarian failure in some women. This medicine may make it more difficult to  get pregnant. Talk to your health care professional if you are concerned about your fertility. This medicine has caused decreased sperm counts in some men. This may make it more difficult to father a child. Talk to your health care professional if you are concerned about your fertility. What side effects may I notice from receiving this medicine? Side effects that you should report to your doctor or health care professional as soon as possible:  allergic reactions like skin rash, itching or hives, swelling of the face, lips, or tongue  chest pain  diarrhea  flushing, runny nose, sweating during infusion  low blood counts - this medicine may decrease the number of white blood cells, red blood cells and platelets. You may be at increased risk for infections and bleeding.  nausea, vomiting  pain, swelling, warmth in the leg  signs of decreased platelets or bleeding - bruising, pinpoint red spots on the  skin, black, tarry stools, blood in the urine  signs of infection - fever or chills, cough, sore throat, pain or difficulty passing urine  signs of decreased red blood cells - unusually weak or tired, fainting spells, lightheadedness Side effects that usually do not require medical attention (report to your doctor or health care professional if they continue or are bothersome):  constipation  hair loss  headache  loss of appetite  mouth sores  stomach pain This list may not describe all possible side effects. Call your doctor for medical advice about side effects. You may report side effects to FDA at 1-800-FDA-1088. Where should I keep my medicine? This drug is given in a hospital or clinic and will not be stored at home. NOTE: This sheet is a summary. It may not cover all possible information. If you have questions about this medicine, talk to your doctor, pharmacist, or health care provider.  2021 Elsevier/Gold Standard (2019-05-15 17:46:13) Discharge Instructions: Thank you for choosing [facility] to provide your oncology and hematology care.  If you have a lab appointment with the [facility], please go directly to the [facility] and check in at the registration area.  Wear comfortable clothing and clothing appropriate for easy access to any Portacath or PICC line.   We strive to give you quality time with your provider. You may need to reschedule your appointment if you arrive late (15 or more minutes).  Arriving late affects you and other patients whose appointments are after yours.  Also, if you miss three or more appointments without notifying the office, you may be dismissed from the clinic at the provider's discretion.      For prescription refill requests, have your pharmacy contact our office and allow 72 hours for refills to be completed.    Today you received the following chemotherapy and/or immunotherapy agents Irinotecan & Fluorouracil      To  help prevent nausea and vomiting after your treatment, we encourage you to take your nausea medication as directed.  BELOW ARE SYMPTOMS THAT SHOULD BE REPORTED IMMEDIATELY: . *FEVER GREATER THAN 100.4 F (38 C) OR HIGHER . *CHILLS OR SWEATING . *NAUSEA AND VOMITING THAT IS NOT CONTROLLED WITH YOUR NAUSEA MEDICATION . *UNUSUAL SHORTNESS OF BREATH . *UNUSUAL BRUISING OR BLEEDING . *URINARY PROBLEMS (pain or burning when urinating, or frequent urination) . *BOWEL PROBLEMS (unusual diarrhea, constipation, pain near the anus) . TENDERNESS IN MOUTH AND THROAT WITH OR WITHOUT PRESENCE OF ULCERS (sore throat, sores in mouth, or a toothache) . UNUSUAL RASH, SWELLING OR PAIN  .  UNUSUAL VAGINAL DISCHARGE OR ITCHING   Items with * indicate a potential emergency and should be followed up as soon as possible or go to the Emergency Department if any problems should occur.  Please show the CHEMOTHERAPY ALERT CARD or IMMUNOTHERAPY ALERT CARD at check-in to the Emergency Department and triage nurse.  Should you have questions after your visit or need to cancel or reschedule your appointment, please contact [facility]  [phone] and follow the prompts.  Office hours are 8:00 a.m. to 4:30 p.m. Monday - Friday. Please note that voicemails left after 4:00 p.m. may not be returned until the following business day.  We are closed weekends and major holidays. You have access to a nurse at all times for urgent questions. Please call the main number to the clinic [phone] and follow the prompts.  For any non-urgent questions, you may also contact your provider using MyChart. We now offer e-Visits for anyone 2 and older to request care online for non-urgent symptoms. For details visit [email or web address].   Also download the MyChart app! Go to the app store, search "MyChart", open the app, select [facility], and log in with your MyChart username and password.  Due to  Covid, a mask is required upon entering the hospital/clinic. If you do not have a mask, one will be given to you upon arrival. For doctor visits, patients may have 1 support person aged 60 or older with them. For treatment visits, patients cannot have anyone with them due to current Covid guidelines and our immunocompromised population.

## 2020-11-19 NOTE — Progress Notes (Signed)
Nutrition Follow-up:  Patient with metastatic pancreatic cancer.  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    Met with patient during infusion.  Patient reports that appetite continues to be good.  "I am eating all the time."  Yesterday ate protein waffle, with banana and peanut butter, cranberry bread. Mid am drank boost plus shake. Lunch was 1/2 roast beef panini and peach.  Mid pm snack of boost plus. Dinner was chicken tetrazzini with salad and ice cream for dessert.      Medications: reviewed  Labs: reviewed  Anthropometrics:   Weight 124 lb increased  122 lb on 4/13  130 lb on 12/27   NUTRITION DIAGNOSIS: Inadequate oral intake improving   INTERVENTION:  Patient with questions regarding certain foods to eat regarding food safety.   Patient to continue to eat 3 meals per day with at least 2 snacks (can be boost plus shakes) to continue weight gain.    MONITORING, EVALUATION, GOAL: Weight trends, intake   NEXT VISIT: Wednesday, June 8 during infusion  Michael Walrath B. Zenia Resides, [place], [place] Registered Dietitian [phone] (mobile)

## 2020-11-19 NOTE — Progress Notes (Signed)
Hematology/Oncology follow up note [facility] [phone] [phone]   Patient Care Team: [name], MD as PCP - General (Family Medicine) Flinchum, Kelby Aline, FNP (Family Medicine) [name], RN as Oncology Nurse Navigator [name], MD as Consulting Physician (Oncology)  REFERRING PROVIDER: Sharmon Leyden*  CHIEF COMPLAINTS/REASON FOR VISIT:  Follow up for treatment of pancreatic adenocarcinoma  HISTORY OF PRESENTING ILLNESS:   Cassandra [name] is a  64 y.o.  female with PMH listed below was seen in consultation at the request of  Flinchum, Michelle S, F*  for evaluation of pancreatic adenocarcinoma Patient initially presented with jaundice, transaminitis, bilirubin was 9.9.  CA 19-9 was 1874.  Patient also reports unintentional weight loss. 02/08/2020 MRI abdomen and MRCP with and without contrast was done at [facility] which showed pancreatic head mass measuring up to 3 cm, with marked associated narrowing of the portal vein confluence.  SMA is preserved.  Marked intrahepatic and extrahepatic biliary duct dilatation as well as mild dilatation of the main pancreatic duct. Multiple hepatic masses highly concerning for metastatic disease.  Patient underwent EUS on 02/19/2020, which showed irregular mass identified in the pancreatic head, hypoechoic, measured 52mmx33mm, sonographic evidence concerning for invasion into the superior mesenteric artery.  There is no sign of significant abnormality in the main pancreatic duct.  Dilatation of common bile duct which measured up to 16 mm.  Region of celiac artery was visualized and showed no signs of significant abnormality.  No lymphadenopathy.  FNA showed adenocarcinoma.  02/19/2020, ERCP, malignant.  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  The stricture was treated with placement of wall flex metal stent.  Patient was  seen by [facility] oncology Dr. Pia Mau and was recommended for 3 drug regimen FOLFIRINOX.  Patient prefers to do chemotherapy locally at [facility].  Patient was referred to establish care today. She denies any pain.  Since stent placement, skin jaundice has improved.  Itchiness has also improved. Patient was accompanied by her husband today.  She has a family history of breast cancer in sister and paternal aunt, colon cancer paternal grandmother.  #no reportable targetable mutation on NGS 9/14/2021cycle 1 FOLFIRINOX.  Patient received oxaliplatin and about 50% of Irinotecan on day 1 and had experienced neurologic symptoms.  She went to ER and working diagnosis is TIA, and eventually I think this is due to irinotecan side effects -irinotecan-associated dysarthria, lip/tongue numbness.  Adjustment was made for Irinotecan to be  infused over 180 minutes.  Atropine 0.5 mg once prior to the irinotecan. No recurrent symptoms.   # 03/19/2020-03/21/2020 patient was admitted due to sepsis with strep pneumonia bacteremia.  Patient was treated with IV Rocephin.  TEE was done which showed no vegetation.  No PFO or ASD.  Patient was discharged home and he finished full course of 14 days of IV Rocephin on 04/02/2020 per ID recommendation.  Repeat blood culture was also negative.  08/25/20 cycle 10 FOLFIRINOX 09/08/20- present  Starting cycle 11, FOLFIRI, Oxaliplatin discontinued due to neuropathy   #NGS showed no reportable targetable mutation #Genetic testing-[facility] diagnostic testing showed no pathological variants identified. MS stable, TMB 8mut/mb, KRAS G12D, SF3B1 K700E, TP53 V255fs*30  INTERVAL HISTORY Cassandra [name] is a 65 y.o. female who has above history reviewed by me today presents for evaluation prior to chemotherapy. Problems and complaints are listed below No new complaints. Bloating has improved. No nausea vomiting diarrhea.  Review of Systems  Constitutional: Negative for appetite change, chills, fatigue,  fever and unexpected weight change.  HENT:   Negative for hearing loss and voice change.   Eyes: Negative for eye problems.  Respiratory: Negative for chest tightness and cough.   Cardiovascular: Negative for chest pain.  Gastrointestinal: Negative for abdominal distention, abdominal pain and blood in stool.  Endocrine: Negative for hot flashes.  Genitourinary: Negative for difficulty urinating and frequency.   Musculoskeletal: Negative for arthralgias.  Skin: Negative for itching and rash.  Neurological: Positive for numbness. Negative for extremity weakness.  Hematological: Negative for adenopathy.  Psychiatric/Behavioral: Negative for confusion.    MEDICAL HISTORY:  Past Medical History:  Diagnosis Date  . Cancer [facility])    pancreatic cancer  . Colon polyps   . Family history of breast cancer   . Neutropenia ([place]) 04/18/2020  . Osteopenia after menopause 05/2017   femoral neck T score -2.0    SURGICAL HISTORY: Past Surgical History:  Procedure Laterality Date  . COLONOSCOPY  07/2015   WNL  . COLONOSCOPY  2008/2011  . OVARIAN CYST REMOVAL  1992   dermoid-Dr Dickens  . PORTA CATH INSERTION N/A 03/07/2020   Procedure: PORTA CATH INSERTION;  Surgeon: Algernon Huxley, MD;  Location: [place] [facility];  Service: Cardiovascular;  Laterality: N/A;  . TEE WITHOUT CARDIOVERSION N/A 03/21/2020   Procedure: TRANSESOPHAGEAL ECHOCARDIOGRAM (TEE);  Surgeon: Dionisio David, MD;  Location: [facility] ORS;  Service: Cardiovascular;  Laterality: N/A;  . TUBAL LIGATION  1993    SOCIAL HISTORY: Social History   Socioeconomic History  . Marital status: Married    Spouse name: Not on file  . Number of children: 2  . Years of education: Not on file  . Highest education level: Not on file  Occupational History  . Occupation: Pharmacist, hospital    Comment: retired  . Occupation: Farmer  Tobacco Use  . Smoking status: Former Smoker    Packs/day: 0.50    Years: 10.00    Pack years: 5.00    Types:  Cigarettes    Quit date: 1987    Years since quitting: 35.4  . Smokeless tobacco: Never Used  . Tobacco comment: Quit smoking 1987  Vaping Use  . Vaping Use: Never used  Substance and Sexual Activity  . Alcohol use: Not Currently    Comment: 0-2 mixed drinks a day  . Drug use: No  . Sexual activity: Not Currently    Birth control/protection: Post-menopausal  Other Topics Concern  . Not on file  Social History Narrative  . Not on file   Social Determinants of Health   Financial Resource Strain: Not on file  Food Insecurity: Not on file  Transportation Needs: Not on file  Physical Activity: Not on file  Stress: Not on file  Social Connections: Not on file  Intimate Partner Violence: Not on file    FAMILY HISTORY: Family History  Problem Relation Age of Onset  . Breast cancer Paternal Aunt 78  . Diabetes Mother   . Osteoporosis Mother   . Hyperlipidemia Father   . Rheumatic fever Father   . Valvular heart disease Father   . Cancer Paternal Grandmother        possible colon  . Breast cancer Sister 43    ALLERGIES:  is allergic to penicillin g.  MEDICATIONS:  Current Outpatient Medications  Medication Sig Dispense Refill  . atorvastatin (LIPITOR) 40 MG tablet Take 1 tablet (40 mg total) by mouth daily. (  Patient not taking: Reported on 11/10/2020) 30 tablet 3  . Calcium Carbonate-Vit D-Min (CALCIUM 1200 PO) Take by mouth.    . Cholecalciferol 25 MCG (1000 UT) tablet Take 2,000 Units by mouth daily.    Marland Kitchen lidocaine-prilocaine (EMLA) cream Apply to affected area once (Patient taking differently: Apply 1 application topically as directed.) 30 g 3  . loperamide (IMODIUM A-D) 2 MG tablet Take 2 at onset of diarrhea, then 1 every 2hrs until 12hr without a BM. May take 2 tab every 4hrs at bedtime. If diarrhea recurs repeat. 100 tablet 1  . magic mouthwash w/lidocaine SOLN Take 5 mLs by mouth 4 (four) times daily as needed for mouth pain. Sig: Swish & Spit 5-10 ml four times a  day as needed. Dispense 480 ml. 1RF 480 mL 1  . Multiple Vitamins-Minerals (MULTIVITAMIN WITH MINERALS) tablet Take 1 tablet by mouth daily.    . ondansetron (ZOFRAN) 8 MG tablet Take 1 tablet (8 mg total) by mouth 2 (two) times daily as needed. Start on day 3 after chemotherapy. 30 tablet 1  . potassium chloride SA (KLOR-CON M20) 20 MEQ tablet Take 1 tablet (20 mEq total) by mouth daily. 30 tablet 0  . prochlorperazine (COMPAZINE) 10 MG tablet Take 1 tablet (10 mg total) by mouth every 6 (six) hours as needed (Nausea or vomiting). 30 tablet 1  . simethicone (GAS-X) 80 MG chewable tablet Chew 1 tablet (80 mg total) by mouth every 8 (eight) hours as needed for flatulence. 60 tablet 0   No current facility-administered medications for this visit.     PHYSICAL EXAMINATION: ECOG PERFORMANCE STATUS: 1 - Symptomatic but completely ambulatory Vitals:   11/19/20 0844  BP: 128/80  Pulse: 60  Resp: 18  Temp: 97.7 F (36.5 C)   Filed Weights   11/19/20 0844  Weight: 124 lb (56.2 kg)    Physical Exam Constitutional:      General: She is not in acute distress. HENT:     Head: Normocephalic and atraumatic.  Eyes:     General: No scleral icterus. Cardiovascular:     Rate and Rhythm: Normal rate and regular rhythm.     Heart sounds: Normal heart sounds.  Pulmonary:     Effort: Pulmonary effort is normal. No respiratory distress.     Breath sounds: No wheezing.  Abdominal:     General: Bowel sounds are normal. There is no distension.     Palpations: Abdomen is soft.  Musculoskeletal:        General: No deformity. Normal range of motion.     Cervical back: Normal range of motion and neck supple.  Skin:    General: Skin is warm and dry.     Coloration: Skin is not jaundiced.     Findings: No erythema or rash.  Neurological:     Mental Status: She is alert and oriented to person, place, and time. Mental status is at baseline.     Cranial Nerves: No cranial nerve deficit.      Coordination: Coordination normal.  Psychiatric:        Mood and Affect: Mood normal.     LABORATORY DATA:  I have reviewed the data as listed Lab Results  Component Value Date   WBC 3.1 (L) 11/05/2020   HGB 10.8 (L) 11/05/2020   HCT 31.0 (L) 11/05/2020   MCV 101.3 (H) 11/05/2020   PLT 152 11/05/2020   Recent Labs    02/06/20 1049 02/29/20 1314 03/19/20 1024 03/20/20 0411 03/21/20  8250 04/04/20 0827 10/08/20 0810 10/22/20 0810 11/05/20 0810  NA 137   < > 129* 136 140   < > 144 140 141  K 4.1   < > 3.8 4.0 3.7   < > 3.9 4.1 3.8  CL 99   < > 95* 104 108   < > 110 106 107  CO2 23   < > $R'25 25 25   'YB$ < > $R'24 23 24  'BP$ GLUCOSE 113*   < > 145* 126* 114*   < > 119* 96 106*  BUN 11   < > 11 7* 6*   < > $R'14 14 15  'fg$ CREATININE 0.80   < > 0.85 0.66 0.52   < > 0.63 0.74 0.63  CALCIUM 10.2   < > 8.9 8.3* 8.2*   < > 8.7* 8.8* 8.6*  GFRNONAA 78   < > >60 >60 >60   < > >60 >60 >60  GFRAA 90   < > >60 >60 >60  --   --   --   --   PROT 7.0   < > 7.1  --  5.3*   < > 6.0* 6.1* 6.3*  ALBUMIN 4.9*   < > 3.5  --  2.5*   < > 3.8 3.7 3.7  AST 195*   < > 30  --  52*   < > 35 34 33  ALT 366*   < > 43  --  74*   < > 34 34 41  ALKPHOS 934*   < > 125  --  112   < > 146* 144* 147*  BILITOT 9.9*   < > 1.5*  --  1.0   < > 0.6 0.6 0.6  BILIDIR 8.11*  --   --   --   --   --   --   --   --   IBILI 1.79*  --   --   --   --   --   --   --   --    < > = values in this interval not displayed.   Iron/TIBC/Ferritin/ %Sat No results found for: IRON, TIBC, FERRITIN, IRONPCTSAT    RADIOGRAPHIC STUDIES: I have personally reviewed the radiological images as listed and agreed with the findings in the report. Reviewed findings of MRI abdomen MRCP done at [facility]. CT CHEST W CONTRAST  Result Date: 10/31/2020 CLINICAL DATA:  Pancreatic cancer EXAM: CT CHEST WITH CONTRAST CT ABDOMEN WITH AND WITHOUT CONTRAST TECHNIQUE: Multidetector CT imaging of the chest was performed during intravenous contrast administration.  Multidetector CT imaging of the abdomen was performed following the standard protocol before and during bolus administration of intravenous contrast. CONTRAST:  144mL OMNIPAQUE IOHEXOL 300 MG/ML  SOLN COMPARISON:  MR abdomen, 06/11/2020, CT chest, 03/13/2020 FINDINGS: CT CHEST FINDINGS Cardiovascular: Right chest port catheter. Scattered aortic atherosclerosis. 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 Normal heart size. No pericardial effusion. Mediastinum/Nodes: No enlarged mediastinal, hilar, or axillary lymph nodes. Thyroid gland, trachea, and esophagus demonstrate no significant findings. Lungs/Pleura: Lungs are clear. No pleural effusion or pneumothorax. Musculoskeletal: No chest wall mass or suspicious bone lesions identified. CT ABDOMEN FINDINGS Hepatobiliary: No solid liver abnormality is seen. No gallstones, gallbladder wall thickening, or biliary dilatation. Redemonstrated common bile duct stent with post stenting pneumobilia. Pancreas: Unchanged soft tissue fullness of the pancreatic head without discrete mass (series 14, image 116). There is mild atrophy of the  distal pancreatic parenchyma. No pancreatic ductal dilatation or surrounding inflammatory changes. Spleen: Normal in size without significant abnormality. Adrenals/Urinary Tract: Adrenal glands are unremarkable. Kidneys are normal, without renal calculi, solid lesion, or hydronephrosis. Stomach/Bowel: Stomach is within normal limits. Appendix appears normal. No evidence of bowel wall thickening, distention, or inflammatory changes. Vascular/Lymphatic: Aortic atherosclerosis. No enlarged abdominal lymph nodes. Other: No abdominal wall hernia or abnormality. No abdominopelvic ascites. Musculoskeletal: No acute or significant osseous findings. IMPRESSION: 1. Unchanged soft tissue fullness of the pancreatic head without discrete mass. There is mild atrophy of the distal pancreatic parenchyma. No  pancreatic ductal dilatation or surrounding inflammatory changes. 2. Redemonstrated common bile duct stent with post stenting pneumobilia. 3. No evidence of lymphadenopathy or metastatic disease in the chest or abdomen. 4. 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 Recommend annual imaging followup by CTA or MRA. This recommendation follows 2010 ACCF/AHA/AATS/ACR/ASA/SCA/SCAI/SIR/STS/SVM Guidelines for the Diagnosis and Management of Patients with Thoracic Aortic Disease. Circulation. 2010; 121: W580-D983. Aortic aneurysm NOS (ICD10-I71.9) Aortic Atherosclerosis (ICD10-I70.0). Electronically Signed   By: Eddie Candle M.D.   On: 10/31/2020 11:59   CT PANCREAS ABD W/WO  Result Date: 10/31/2020 CLINICAL DATA:  Pancreatic cancer EXAM: CT CHEST WITH CONTRAST CT ABDOMEN WITH AND WITHOUT CONTRAST TECHNIQUE: Multidetector CT imaging of the chest was performed during intravenous contrast administration. Multidetector CT imaging of the abdomen was performed following the standard protocol before and during bolus administration of intravenous contrast. CONTRAST:  156mL OMNIPAQUE IOHEXOL 300 MG/ML  SOLN COMPARISON:  MR abdomen, 06/11/2020, CT chest, 03/13/2020 FINDINGS: CT CHEST FINDINGS Cardiovascular: Right chest port catheter. Scattered aortic atherosclerosis. 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 Normal heart size. No pericardial effusion. Mediastinum/Nodes: No enlarged mediastinal, hilar, or axillary lymph nodes. Thyroid gland, trachea, and esophagus demonstrate no significant findings. Lungs/Pleura: Lungs are clear. No pleural effusion or pneumothorax. Musculoskeletal: No chest wall mass or suspicious bone lesions identified. CT ABDOMEN FINDINGS Hepatobiliary: No solid liver abnormality is seen. No gallstones, gallbladder wall thickening, or biliary dilatation. Redemonstrated common bile duct stent with post  stenting pneumobilia. Pancreas: Unchanged soft tissue fullness of the pancreatic head without discrete mass (series 14, image 116). There is mild atrophy of the distal pancreatic parenchyma. No pancreatic ductal dilatation or surrounding inflammatory changes. Spleen: Normal in size without significant abnormality. Adrenals/Urinary Tract: Adrenal glands are unremarkable. Kidneys are normal, without renal calculi, solid lesion, or hydronephrosis. Stomach/Bowel: Stomach is within normal limits. Appendix appears normal. No evidence of bowel wall thickening, distention, or inflammatory changes. Vascular/Lymphatic: Aortic atherosclerosis. No enlarged abdominal lymph nodes. Other: No abdominal wall hernia or abnormality. No abdominopelvic ascites. Musculoskeletal: No acute or significant osseous findings. IMPRESSION: 1. Unchanged soft tissue fullness of the pancreatic head without discrete mass. There is mild atrophy of the distal pancreatic parenchyma. No pancreatic ductal dilatation or surrounding inflammatory changes. 2. Redemonstrated common bile duct stent with post stenting pneumobilia. 3. No evidence of lymphadenopathy or metastatic disease in the chest or abdomen. 4. 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 Recommend annual imaging followup by CTA or MRA. This recommendation follows 2010 ACCF/AHA/AATS/ACR/ASA/SCA/SCAI/SIR/STS/SVM Guidelines for the Diagnosis and Management of Patients with Thoracic Aortic Disease. Circulation. 2010; 121: J825-K539. Aortic aneurysm NOS (ICD10-I71.9) Aortic Atherosclerosis (ICD10-I70.0). Electronically Signed   By: Eddie Candle M.D.   On: 10/31/2020 11:59     ASSESSMENT & PLAN:  1.  Primary pancreatic cancer ([place])   2. Encounter for antineoplastic chemotherapy   3. Anemia due to antineoplastic chemotherapy   4. 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 proceed with FOLFIRI today. [place] for pneumococcal vaccination.   #Elevated LFT, improved.  Off Lipitor.    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   Patient follows up with neurology for management..  Continue acupuncture.  #Chemotherapy-induced anemia.  Hemoglobin is stable at 11 monitor closely. # mild neutropenia due to chemo. monitor Supportive care measures are necessary for patient well-being and will be provided as necessary. We spent sufficient time to discuss many aspect of care, questions were answered to patient's satisfaction.   All questions were answered. The patient knows to call the clinic with any problems questions or concerns.  Return of visit: 2 weeks  [name], MD, PhD Hematology Oncology [facility] at [facility] Pager- [phone] 11/19/2020

## 2020-11-19 NOTE — Progress Notes (Signed)
Patient denies new problems/concerns today.   °

## 2020-11-20 LAB — CANCER ANTIGEN 19-9: CA 19-9: 6 U/mL (ref 0–35)

## 2020-11-21 ENCOUNTER — Inpatient Hospital Stay: Payer: Medicare Other

## 2020-11-21 DIAGNOSIS — Z5111 Encounter for antineoplastic chemotherapy: Secondary | ICD-10-CM | POA: Diagnosis not present

## 2020-11-21 DIAGNOSIS — C259 Malignant neoplasm of pancreas, unspecified: Secondary | ICD-10-CM

## 2020-11-21 MED ORDER — HEPARIN SOD (PORK) LOCK FLUSH 100 UNIT/ML IV SOLN
INTRAVENOUS | Status: AC
  Filled 2020-11-21: qty 5

## 2020-11-21 MED ORDER — HEPARIN SOD (PORK) LOCK FLUSH 100 UNIT/ML IV SOLN
500.0000 [IU] | Freq: Once | INTRAVENOUS | Status: AC | PRN
  Administered 2020-11-21: 500 [IU]
  Filled 2020-11-21: qty 5

## 2020-11-21 MED ORDER — SODIUM CHLORIDE 0.9% FLUSH
10.0000 mL | INTRAVENOUS | Status: DC | PRN
  Administered 2020-11-21: 10 mL
  Filled 2020-11-21: qty 10

## 2020-12-03 ENCOUNTER — Encounter: Payer: Self-pay | Admitting: Oncology

## 2020-12-03 ENCOUNTER — Inpatient Hospital Stay: Payer: Medicare Other

## 2020-12-03 ENCOUNTER — Inpatient Hospital Stay (HOSPITAL_BASED_OUTPATIENT_CLINIC_OR_DEPARTMENT_OTHER): Payer: Medicare Other | Admitting: Oncology

## 2020-12-03 ENCOUNTER — Inpatient Hospital Stay: Payer: Medicare Other | Attending: Oncology

## 2020-12-03 ENCOUNTER — Other Ambulatory Visit: Payer: Self-pay

## 2020-12-03 VITALS — BP 122/68 | HR 58 | Temp 97.6°F | Resp 16 | Wt 123.0 lb

## 2020-12-03 DIAGNOSIS — D696 Thrombocytopenia, unspecified: Secondary | ICD-10-CM | POA: Insufficient documentation

## 2020-12-03 DIAGNOSIS — C787 Secondary malignant neoplasm of liver and intrahepatic bile duct: Secondary | ICD-10-CM | POA: Diagnosis present

## 2020-12-03 DIAGNOSIS — Z808 Family history of malignant neoplasm of other organs or systems: Secondary | ICD-10-CM | POA: Diagnosis not present

## 2020-12-03 DIAGNOSIS — Z8249 Family history of ischemic heart disease and other diseases of the circulatory system: Secondary | ICD-10-CM | POA: Diagnosis not present

## 2020-12-03 DIAGNOSIS — Z79899 Other long term (current) drug therapy: Secondary | ICD-10-CM | POA: Diagnosis not present

## 2020-12-03 DIAGNOSIS — Z87891 Personal history of nicotine dependence: Secondary | ICD-10-CM | POA: Diagnosis not present

## 2020-12-03 DIAGNOSIS — C259 Malignant neoplasm of pancreas, unspecified: Secondary | ICD-10-CM

## 2020-12-03 DIAGNOSIS — Z5111 Encounter for antineoplastic chemotherapy: Secondary | ICD-10-CM | POA: Diagnosis present

## 2020-12-03 DIAGNOSIS — D6481 Anemia due to antineoplastic chemotherapy: Secondary | ICD-10-CM

## 2020-12-03 DIAGNOSIS — Z452 Encounter for adjustment and management of vascular access device: Secondary | ICD-10-CM | POA: Insufficient documentation

## 2020-12-03 DIAGNOSIS — Z8349 Family history of other endocrine, nutritional and metabolic diseases: Secondary | ICD-10-CM | POA: Diagnosis not present

## 2020-12-03 DIAGNOSIS — T451X5A Adverse effect of antineoplastic and immunosuppressive drugs, initial encounter: Secondary | ICD-10-CM

## 2020-12-03 DIAGNOSIS — Z803 Family history of malignant neoplasm of breast: Secondary | ICD-10-CM | POA: Diagnosis not present

## 2020-12-03 DIAGNOSIS — R7989 Other specified abnormal findings of blood chemistry: Secondary | ICD-10-CM | POA: Insufficient documentation

## 2020-12-03 DIAGNOSIS — I700 Atherosclerosis of aorta: Secondary | ICD-10-CM | POA: Insufficient documentation

## 2020-12-03 DIAGNOSIS — G620 Drug-induced polyneuropathy: Secondary | ICD-10-CM | POA: Diagnosis not present

## 2020-12-03 DIAGNOSIS — C250 Malignant neoplasm of head of pancreas: Secondary | ICD-10-CM | POA: Diagnosis present

## 2020-12-03 DIAGNOSIS — R200 Anesthesia of skin: Secondary | ICD-10-CM | POA: Insufficient documentation

## 2020-12-03 DIAGNOSIS — Z8269 Family history of other diseases of the musculoskeletal system and connective tissue: Secondary | ICD-10-CM | POA: Insufficient documentation

## 2020-12-03 DIAGNOSIS — Z95828 Presence of other vascular implants and grafts: Secondary | ICD-10-CM

## 2020-12-03 DIAGNOSIS — Z833 Family history of diabetes mellitus: Secondary | ICD-10-CM | POA: Insufficient documentation

## 2020-12-03 LAB — CBC WITH DIFFERENTIAL/PLATELET
Abs Immature Granulocytes: 0.01 10*3/uL (ref 0.00–0.07)
Basophils Absolute: 0 10*3/uL (ref 0.0–0.1)
Basophils Relative: 1 %
Eosinophils Absolute: 0.1 10*3/uL (ref 0.0–0.5)
Eosinophils Relative: 2 %
HCT: 33.7 % — ABNORMAL LOW (ref 36.0–46.0)
Hemoglobin: 11.5 g/dL — ABNORMAL LOW (ref 12.0–15.0)
Immature Granulocytes: 0 %
Lymphocytes Relative: 32 %
Lymphs Abs: 1.1 10*3/uL (ref 0.7–4.0)
MCH: 34.2 pg — ABNORMAL HIGH (ref 26.0–34.0)
MCHC: 34.1 g/dL (ref 30.0–36.0)
MCV: 100.3 fL — ABNORMAL HIGH (ref 80.0–100.0)
Monocytes Absolute: 0.5 10*3/uL (ref 0.1–1.0)
Monocytes Relative: 14 %
Neutro Abs: 1.8 10*3/uL (ref 1.7–7.7)
Neutrophils Relative %: 51 %
Platelets: 170 10*3/uL (ref 150–400)
RBC: 3.36 MIL/uL — ABNORMAL LOW (ref 3.87–5.11)
RDW: 13 % (ref 11.5–15.5)
WBC: 3.5 10*3/uL — ABNORMAL LOW (ref 4.0–10.5)
nRBC: 0 % (ref 0.0–0.2)

## 2020-12-03 LAB — COMPREHENSIVE METABOLIC PANEL
ALT: 41 U/L (ref 0–44)
AST: 31 U/L (ref 15–41)
Albumin: 4.2 g/dL (ref 3.5–5.0)
Alkaline Phosphatase: 100 U/L (ref 38–126)
Anion gap: 8 (ref 5–15)
BUN: 15 mg/dL (ref 8–23)
CO2: 26 mmol/L (ref 22–32)
Calcium: 9.1 mg/dL (ref 8.9–10.3)
Chloride: 104 mmol/L (ref 98–111)
Creatinine, Ser: 0.51 mg/dL (ref 0.44–1.00)
GFR, Estimated: >60 mL/min (ref >60)
Glucose, Bld: 84 mg/dL (ref 70–99)
Potassium: 4.3 mmol/L (ref 3.5–5.1)
Sodium: 138 mmol/L (ref 135–145)
Total Bilirubin: 0.4 mg/dL (ref 0.3–1.2)
Total Protein: 6.6 g/dL (ref 6.5–8.1)

## 2020-12-03 MED ORDER — SODIUM CHLORIDE 0.9 % IV SOLN
150.0000 mg | Freq: Once | INTRAVENOUS | Status: AC
  Administered 2020-12-03: 150 mg via INTRAVENOUS
  Filled 2020-12-03: qty 150

## 2020-12-03 MED ORDER — SODIUM CHLORIDE 0.9 % IV SOLN
10.0000 mg | Freq: Once | INTRAVENOUS | Status: AC
  Administered 2020-12-03: 10 mg via INTRAVENOUS
  Filled 2020-12-03: qty 10

## 2020-12-03 MED ORDER — SODIUM CHLORIDE 0.9 % IV SOLN
2400.0000 mg/m2 | INTRAVENOUS | Status: DC
  Administered 2020-12-03: 4000 mg via INTRAVENOUS
  Filled 2020-12-03: qty 80

## 2020-12-03 MED ORDER — SODIUM CHLORIDE 0.9% FLUSH
10.0000 mL | INTRAVENOUS | Status: DC | PRN
  Filled 2020-12-03: qty 10

## 2020-12-03 MED ORDER — SODIUM CHLORIDE 0.9 % IV SOLN
650.0000 mg | Freq: Once | INTRAVENOUS | Status: AC
  Administered 2020-12-03: 650 mg via INTRAVENOUS
  Filled 2020-12-03: qty 25

## 2020-12-03 MED ORDER — SODIUM CHLORIDE 0.9 % IV SOLN
Freq: Once | INTRAVENOUS | Status: AC
  Filled 2020-12-03: qty 250

## 2020-12-03 MED ORDER — SODIUM CHLORIDE 0.9% FLUSH
10.0000 mL | Freq: Once | INTRAVENOUS | Status: AC
  Administered 2020-12-03: 10 mL via INTRAVENOUS
  Filled 2020-12-03: qty 10

## 2020-12-03 MED ORDER — DIPHENHYDRAMINE HCL 25 MG PO CAPS: 25.0000 mg | ORAL_CAPSULE | Freq: Once | ORAL | Status: DC

## 2020-12-03 MED ORDER — PALONOSETRON HCL INJECTION 0.25 MG/5ML
0.2500 mg | Freq: Once | INTRAVENOUS | Status: AC
  Administered 2020-12-03: 0.25 mg via INTRAVENOUS
  Filled 2020-12-03: qty 5

## 2020-12-03 MED ORDER — ATROPINE SULFATE 1 MG/ML IJ SOLN
0.5000 mg | Freq: Once | INTRAMUSCULAR | Status: AC
  Administered 2020-12-03: 0.5 mg via INTRAVENOUS
  Filled 2020-12-03: qty 1

## 2020-12-03 MED ORDER — SODIUM CHLORIDE 0.9 % IV SOLN
150.0000 mg/m2 | Freq: Once | INTRAVENOUS | Status: AC
  Administered 2020-12-03: 260 mg via INTRAVENOUS
  Filled 2020-12-03: qty 10

## 2020-12-03 MED ORDER — DIPHENHYDRAMINE HCL 25 MG PO CAPS
25.0000 mg | ORAL_CAPSULE | Freq: Once | ORAL | Status: AC
  Administered 2020-12-03: 25 mg via ORAL
  Filled 2020-12-03: qty 1

## 2020-12-03 NOTE — Progress Notes (Signed)
Nutrition Follow-up:  Patient with metastatic pancreatic cancer.  Patient currently on folfiri, oxaliplatin removed due to neuropathy.    Met with patient during infusion.  Patient reports that her appetite continues to be good.  Does have a "crash" day evening of 3 day and day 4 after chemo.  Reports although appetite is still good during these days.  Continues to drink boost plus shakes daily.     Medications: reviewed  Labs: reviewed  Anthropometrics:   Weight 123 lb today  124 lb on 5/25 122 lb on 4/13 130 lb on 12/27   NUTRITION DIAGNOSIS: Inadequate oral intake improving    INTERVENTION:  Patient with questions regarding interaction with chemo drug and eating grapefruit.  Discussed with Matt, Pharmacy and no interactions with chemo drug or other oral medications and grapefruit.  Patient informed.   Continue 3 meals per day with at least 2 snacks daily    MONITORING, EVALUATION, GOAL: weight trend, intake   NEXT VISIT: ~ 4-6 weeks during infusion  Cassandra [name] B. Zenia Resides, [place], [place] Registered Dietitian [phone] (mobile)

## 2020-12-03 NOTE — Progress Notes (Signed)
Hematology/Oncology follow up note [facility] Telephone:([phone]) [phone] [phone]   Patient Care Team: [name], MD as PCP - General (Family Medicine) Flinchum, Kelby Aline, FNP (Family Medicine) [name], RN as Oncology Nurse Navigator [name], MD as Consulting Physician (Oncology)  REFERRING PROVIDER: [name], MD  CHIEF COMPLAINTS/REASON FOR VISIT:  Follow up for treatment of pancreatic adenocarcinoma  HISTORY OF PRESENTING ILLNESS:   Cassandra [name] is a  65 y.o.  female with PMH listed below was seen in consultation at the request of  [name], [name], MD  for evaluation of pancreatic adenocarcinoma.  Patient initially presented with jaundice, transaminitis, bilirubin was 9.9.  CA 19-9 was 1874.  Patient also reports unintentional weight loss at this time. 02/08/2020 MRI abdomen and MRCP with and without contrast was done at [facility] which showed pancreatic head mass measuring up to 3 cm, with marked associated narrowing of the portal vein confluence.  SMA is preserved.  Marked intrahepatic and extrahepatic biliary duct dilatation as well as mild dilatation of the main pancreatic duct. Multiple hepatic masses highly concerning for metastatic disease.  Patient underwent EUS on 02/19/2020, which showed irregular mass identified in the pancreatic head, hypoechoic, measured 36mmx33mm, sonographic evidence concerning for invasion into the superior mesenteric artery.  There is no sign of significant abnormality in the main pancreatic duct.  Dilatation of common bile duct which measured up to 16 mm.  Region of celiac artery was visualized and showed no signs of significant abnormality.  No lymphadenopathy.  FNA showed adenocarcinoma.  02/19/2020, ERCP, malignant.  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  The stricture was treated with placement of wall flex metal  stent.  Patient was seen by [facility] oncology Dr. Pia Mau and was recommended for 3 drug regimen FOLFIRINOX.  Patient prefers to do chemotherapy locally at [facility].  Patient was referred to establish care today. She denies any pain.  Since stent placement, skin jaundice has improved.  Itchiness has also improved. Patient was accompanied by her husband today.  She has a family history of breast cancer in sister and paternal aunt, colon cancer paternal grandmother.  #no reportable targetable mutation on NGS 9/14/2021cycle 1 FOLFIRINOX.  Patient received oxaliplatin and about 50% of Irinotecan on day 1 and had experienced neurologic symptoms.  She went to ER and working diagnosis is TIA, and eventually I think this is due to irinotecan side effects -irinotecan-associated dysarthria, lip/tongue numbness.  Adjustment was made for Irinotecan to be  infused over 180 minutes.  Atropine 0.5 mg once prior to the irinotecan. No recurrent symptoms.   # 03/19/2020-03/21/2020 patient was admitted due to sepsis with strep pneumonia bacteremia.  Patient was treated with IV Rocephin.  TEE was done which showed no vegetation.  No PFO or ASD.  Patient was discharged home and he finished full course of 14 days of IV Rocephin on 04/02/2020 per ID recommendation.  Repeat blood culture was also negative.   08/25/20 cycle 10 FOLFIRINOX  09/08/20- present  Starting cycle 11, FOLFIRI, Oxaliplatin discontinued due to neuropathy  #NGS showed no reportable targetable mutation #Genetic testing-[employer] diagnostic testing showed no pathological variants identified. MS stable, TMB 76mut/mb, KRAS G12D, SF3B1 K700E, TP53 V249fs*30  INTERVAL HISTORY Cassandra [name] is a 65 y.o. female who presents today for next cycle of FOLFIRI.  She reports doing well. Denies fevers, wt. Loss, chest pain, shortness of breath, nausea, vomiting, diarrhea,  change in bowel habits (reports 2-3 soft, brown bowel movements daily). Denies hematochezia/melena.   The  patient and her husband have questions about clinical trials and experimental medications that they have heard of on the television and online.  She has questions about immunotherapy as well.   Review of Systems  Constitutional: Negative for fever, malaise/fatigue and weight loss.  HENT: Negative for congestion and hearing loss.   Eyes: Negative for blurred vision and double vision.  Respiratory: Negative for cough.   Cardiovascular: Negative for chest pain and palpitations.  Gastrointestinal: Negative for abdominal pain, blood in stool, constipation, diarrhea, melena, nausea and vomiting.       Reports "some urgency" with bowel movements  Genitourinary: Negative for frequency.  Skin: Negative for rash.  Neurological: Negative for dizziness, tingling, focal weakness and headaches.       Reports numbness in feet, however improved with acupuncture.   Endo/Heme/Allergies: Does not bruise/bleed easily.  Psychiatric/Behavioral: Negative for depression. The patient is not nervous/anxious and does not have insomnia.     MEDICAL HISTORY:  Past Medical History:  Diagnosis Date  . Cancer [facility])    pancreatic cancer  . Colon polyps   . Family history of breast cancer   . Neutropenia ([place]) 04/18/2020  . Osteopenia after menopause 05/2017   femoral neck T score -2.0    SURGICAL HISTORY: Past Surgical History:  Procedure Laterality Date  . COLONOSCOPY  07/2015   WNL  . COLONOSCOPY  2008/2011  . OVARIAN CYST REMOVAL  1992   dermoid-Dr [employer]  . PORTA CATH INSERTION N/A 03/07/2020   Procedure: PORTA CATH INSERTION;  Surgeon: Algernon Huxley, MD;  Location: [place] [facility];  Service: Cardiovascular;  Laterality: N/A;  . TEE WITHOUT CARDIOVERSION N/A 03/21/2020   Procedure: TRANSESOPHAGEAL ECHOCARDIOGRAM (TEE);  Surgeon: Dionisio David, MD;  Location: [facility] ORS;  Service: Cardiovascular;  Laterality: N/A;  . TUBAL LIGATION  1993    SOCIAL HISTORY: Social History   Socioeconomic History  .  Marital status: Married    Spouse name: Not on file  . Number of children: 2  . Years of education: Not on file  . Highest education level: Not on file  Occupational History  . Occupation: Pharmacist, hospital    Comment: retired  . Occupation: Farmer  Tobacco Use  . Smoking status: Former Smoker    Packs/day: 0.50    Years: 10.00    Pack years: 5.00    Types: Cigarettes    Quit date: 1987    Years since quitting: 35.4  . Smokeless tobacco: Never Used  . Tobacco comment: Quit smoking 1987  Vaping Use  . Vaping Use: Never used  Substance and Sexual Activity  . Alcohol use: Not Currently    Comment: 0-2 mixed drinks a day  . Drug use: No  . Sexual activity: Not Currently    Birth control/protection: Post-menopausal  Other Topics Concern  . Not on file  Social History Narrative  . Not on file   Social Determinants of Health   Financial Resource Strain: Not on file  Food Insecurity: Not on file  Transportation Needs: Not on file  Physical Activity: Not on file  Stress: Not on file  Social Connections: Not on file  Intimate Partner Violence: Not on file    FAMILY HISTORY: Family History  Problem Relation Age of Onset  . Breast cancer Paternal Aunt 77  . Diabetes Mother   . Osteoporosis Mother   . Hyperlipidemia Father   .  Rheumatic fever Father   . Valvular heart disease Father   . Cancer Paternal Grandmother        possible colon  . Breast cancer Sister 80    ALLERGIES:  is allergic to penicillin g.  MEDICATIONS:  Current Outpatient Medications  Medication Sig Dispense Refill  . Calcium Carbonate-Vit D-Min (CALCIUM 1200 PO) Take by mouth.    . Cholecalciferol 25 MCG (1000 UT) tablet Take 2,000 Units by mouth daily.    Marland Kitchen lidocaine-prilocaine (EMLA) cream Apply to affected area once (Patient taking differently: Apply 1 application topically as directed.) 30 g 3  . loperamide (IMODIUM A-D) 2 MG tablet Take 2 at onset of diarrhea, then 1 every 2hrs until 12hr without a BM.  May take 2 tab every 4hrs at bedtime. If diarrhea recurs repeat. 100 tablet 1  . magic mouthwash w/lidocaine SOLN Take 5 mLs by mouth 4 (four) times daily as needed for mouth pain. Sig: Swish & Spit 5-10 ml four times a day as needed. Dispense 480 ml. 1RF 480 mL 1  . Multiple Vitamins-Minerals (MULTIVITAMIN WITH MINERALS) tablet Take 1 tablet by mouth daily.    . ondansetron (ZOFRAN) 8 MG tablet Take 1 tablet (8 mg total) by mouth 2 (two) times daily as needed. Start on day 3 after chemotherapy. 30 tablet 1  . potassium chloride SA (KLOR-CON M20) 20 MEQ tablet Take 1 tablet (20 mEq total) by mouth daily. (Patient taking differently: Take 20 mEq by mouth daily. QOD) 30 tablet 0  . prochlorperazine (COMPAZINE) 10 MG tablet Take 1 tablet (10 mg total) by mouth every 6 (six) hours as needed (Nausea or vomiting). 30 tablet 1  . simethicone (GAS-X) 80 MG chewable tablet Chew 1 tablet (80 mg total) by mouth every 8 (eight) hours as needed for flatulence. 60 tablet 0  . atorvastatin (LIPITOR) 40 MG tablet Take 1 tablet (40 mg total) by mouth daily. (Patient not taking: No sig reported) 30 tablet 3   No current facility-administered medications for this visit.     PHYSICAL EXAMINATION: ECOG PERFORMANCE STATUS: 1 - Symptomatic but completely ambulatory  BP 122/68 (Patient Position: Sitting)   Pulse (!) 58   Temp 97.6 F (36.4 C) (Tympanic)   Resp 16   Wt 55.8 kg   SpO2 100%   BMI 19.26 kg/m    Physical Exam Vitals and nursing note reviewed.  HENT:     Head: Normocephalic and atraumatic.     Nose: No congestion.     Mouth/Throat:     Mouth: Mucous membranes are moist.     Pharynx: Oropharynx is clear.  Eyes:     General:        Right eye: No discharge.        Left eye: No discharge.     Extraocular Movements: Extraocular movements intact.     Pupils: Pupils are equal, round, and reactive to light.  Cardiovascular:     Rate and Rhythm: Normal rate and regular rhythm.     Pulses: Normal  pulses.     Heart sounds: No murmur heard.   Pulmonary:     Effort: Pulmonary effort is normal.  Abdominal:     General: Bowel sounds are normal. There is no distension.     Tenderness: There is no abdominal tenderness. There is no guarding.  Genitourinary:    Comments: Not examined Musculoskeletal:        General: No swelling or deformity.     Right lower leg: No  edema.     Left lower leg: No edema.  Skin:    General: Skin is warm and dry.     Capillary Refill: Capillary refill takes less than 2 seconds.  Neurological:     Mental Status: She is alert and oriented to person, place, and time.     Motor: No weakness.  Psychiatric:        Mood and Affect: Mood normal.        Behavior: Behavior normal.        Thought Content: Thought content normal.      Vitals:   12/03/20 0853  BP: 122/68  Pulse: (!) 58  Resp: 16  Temp: 97.6 F (36.4 C)  SpO2: 100%   Filed Weights   12/03/20 0853  Weight: 123 lb (55.8 kg)     LABORATORY DATA:  I have reviewed the data as listed Lab Results  Component Value Date   WBC 3.5 (L) 12/03/2020   HGB 11.5 (L) 12/03/2020   HCT 33.7 (L) 12/03/2020   MCV 100.3 (H) 12/03/2020   PLT 170 12/03/2020   Recent Labs    02/06/20 1049 02/29/20 1314 03/19/20 1024 03/20/20 0411 03/21/20 0318 04/04/20 0827 10/22/20 0810 11/05/20 0810 11/19/20 0811  NA 137   < > 129* 136 140   < > 140 141 141  K 4.1   < > 3.8 4.0 3.7   < > 4.1 3.8 4.0  CL 99   < > 95* 104 108   < > 106 107 106  CO2 23   < > $R'25 25 25   'BS$ < > $R'23 24 25  'hu$ GLUCOSE 113*   < > 145* 126* 114*   < > 96 106* 96  BUN 11   < > 11 7* 6*   < > $R'14 15 11  'JU$ CREATININE 0.80   < > 0.85 0.66 0.52   < > 0.74 0.63 0.65  CALCIUM 10.2   < > 8.9 8.3* 8.2*   < > 8.8* 8.6* 8.7*  GFRNONAA 78   < > >60 >60 >60   < > >60 >60 >60  GFRAA 90   < > >60 >60 >60  --   --   --   --   PROT 7.0   < > 7.1  --  5.3*   < > 6.1* 6.3* 6.3*  ALBUMIN 4.9*   < > 3.5  --  2.5*   < > 3.7 3.7 4.0  AST 195*   < > 30  --   52*   < > 34 33 51*  ALT 366*   < > 43  --  74*   < > 34 41 38  ALKPHOS 934*   < > 125  --  112   < > 144* 147* 109  BILITOT 9.9*   < > 1.5*  --  1.0   < > 0.6 0.6 0.6  BILIDIR 8.11*  --   --   --   --   --   --   --   --   IBILI 1.79*  --   --   --   --   --   --   --   --    < > = values in this interval not displayed.   Iron/TIBC/Ferritin/ %Sat No results found for: IRON, TIBC, FERRITIN, IRONPCTSAT    RADIOGRAPHIC STUDIES: I have personally reviewed the radiological images as listed and agreed with the findings in the  report. Reviewed findings of MRI abdomen MRCP done at [facility]. CT CHEST W CONTRAST  Result Date: 10/31/2020 CLINICAL DATA:  Pancreatic cancer EXAM: CT CHEST WITH CONTRAST CT ABDOMEN WITH AND WITHOUT CONTRAST TECHNIQUE: Multidetector CT imaging of the chest was performed during intravenous contrast administration. Multidetector CT imaging of the abdomen was performed following the standard protocol before and during bolus administration of intravenous contrast. CONTRAST:  154mL OMNIPAQUE IOHEXOL 300 MG/ML  SOLN COMPARISON:  MR abdomen, 06/11/2020, CT chest, 03/13/2020 FINDINGS: CT CHEST FINDINGS Cardiovascular: Right chest port catheter. Scattered aortic atherosclerosis. 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 Normal heart size. No pericardial effusion. Mediastinum/Nodes: No enlarged mediastinal, hilar, or axillary lymph nodes. Thyroid gland, trachea, and esophagus demonstrate no significant findings. Lungs/Pleura: Lungs are clear. No pleural effusion or pneumothorax. Musculoskeletal: No chest wall mass or suspicious bone lesions identified. CT ABDOMEN FINDINGS Hepatobiliary: No solid liver abnormality is seen. No gallstones, gallbladder wall thickening, or biliary dilatation. Redemonstrated common bile duct stent with post stenting pneumobilia. Pancreas: Unchanged soft tissue fullness of the pancreatic head without discrete mass  (series 14, image 116). There is mild atrophy of the distal pancreatic parenchyma. No pancreatic ductal dilatation or surrounding inflammatory changes. Spleen: Normal in size without significant abnormality. Adrenals/Urinary Tract: Adrenal glands are unremarkable. Kidneys are normal, without renal calculi, solid lesion, or hydronephrosis. Stomach/Bowel: Stomach is within normal limits. Appendix appears normal. No evidence of bowel wall thickening, distention, or inflammatory changes. Vascular/Lymphatic: Aortic atherosclerosis. No enlarged abdominal lymph nodes. Other: No abdominal wall hernia or abnormality. No abdominopelvic ascites. Musculoskeletal: No acute or significant osseous findings. IMPRESSION: 1. Unchanged soft tissue fullness of the pancreatic head without discrete mass. There is mild atrophy of the distal pancreatic parenchyma. No pancreatic ductal dilatation or surrounding inflammatory changes. 2. Redemonstrated common bile duct stent with post stenting pneumobilia. 3. No evidence of lymphadenopathy or metastatic disease in the chest or abdomen. 4. 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 Recommend annual imaging followup by CTA or MRA. This recommendation follows 2010 ACCF/AHA/AATS/ACR/ASA/SCA/SCAI/SIR/STS/SVM Guidelines for the Diagnosis and Management of Patients with Thoracic Aortic Disease. Circulation. 2010; 121: H607-P710. Aortic aneurysm NOS (ICD10-I71.9) Aortic Atherosclerosis (ICD10-I70.0). Electronically Signed   By: Eddie Candle M.D.   On: 10/31/2020 11:59   CT PANCREAS ABD W/WO  Result Date: 10/31/2020 CLINICAL DATA:  Pancreatic cancer EXAM: CT CHEST WITH CONTRAST CT ABDOMEN WITH AND WITHOUT CONTRAST TECHNIQUE: Multidetector CT imaging of the chest was performed during intravenous contrast administration. Multidetector CT imaging of the abdomen was performed following the standard protocol before and during bolus administration of  intravenous contrast. CONTRAST:  184mL OMNIPAQUE IOHEXOL 300 MG/ML  SOLN COMPARISON:  MR abdomen, 06/11/2020, CT chest, 03/13/2020 FINDINGS: CT CHEST FINDINGS Cardiovascular: Right chest port catheter. Scattered aortic atherosclerosis. 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 Normal heart size. No pericardial effusion. Mediastinum/Nodes: No enlarged mediastinal, hilar, or axillary lymph nodes. Thyroid gland, trachea, and esophagus demonstrate no significant findings. Lungs/Pleura: Lungs are clear. No pleural effusion or pneumothorax. Musculoskeletal: No chest wall mass or suspicious bone lesions identified. CT ABDOMEN FINDINGS Hepatobiliary: No solid liver abnormality is seen. No gallstones, gallbladder wall thickening, or biliary dilatation. Redemonstrated common bile duct stent with post stenting pneumobilia. Pancreas: Unchanged soft tissue fullness of the pancreatic head without discrete mass (series 14, image 116). There is mild atrophy of the distal pancreatic parenchyma.  No pancreatic ductal dilatation or surrounding inflammatory changes. Spleen: Normal in size without significant abnormality. Adrenals/Urinary Tract: Adrenal glands are unremarkable. Kidneys are normal, without renal calculi, solid lesion, or hydronephrosis. Stomach/Bowel: Stomach is within normal limits. Appendix appears normal. No evidence of bowel wall thickening, distention, or inflammatory changes. Vascular/Lymphatic: Aortic atherosclerosis. No enlarged abdominal lymph nodes. Other: No abdominal wall hernia or abnormality. No abdominopelvic ascites. Musculoskeletal: No acute or significant osseous findings. IMPRESSION: 1. Unchanged soft tissue fullness of the pancreatic head without discrete mass. There is mild atrophy of the distal pancreatic parenchyma. No pancreatic ductal dilatation or surrounding inflammatory changes. 2. Redemonstrated common bile duct stent with post stenting  pneumobilia. 3. No evidence of lymphadenopathy or metastatic disease in the chest or abdomen. 4. 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 Recommend annual imaging followup by CTA or MRA. This recommendation follows 2010 ACCF/AHA/AATS/ACR/ASA/SCA/SCAI/SIR/STS/SVM Guidelines for the Diagnosis and Management of Patients with Thoracic Aortic Disease. Circulation. 2010; 121: O973-Z329. Aortic aneurysm NOS (ICD10-I71.9) Aortic Atherosclerosis (ICD10-I70.0). Electronically Signed   By: Eddie Candle M.D.   On: 10/31/2020 11:59     ASSESSMENT & PLAN:  No diagnosis found.Cancer Staging Primary pancreatic cancer [facility]) Staging form: Exocrine Pancreas, AJCC 8th Edition - Clinical stage from 02/29/2020: Stage IV (cT2, cN0, cM1) - Signed by [name], MD on 02/29/2020  1. 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 CA 19-9 on 11/19/2020 was 6. CA 19-9 drawn today-pending. Proceed with cycle 17 FOLFIRI today.  2. Elevated LFT Labs from 12/03/2020 show her LFTs have normalized. Off Lipitor.    3. 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   Patient follows up with neurology for management..   Continue weekly acupuncture with chiropractor  4. Chemotherapy-induced anemia.   Hemoglobin is stable at 11.5 today. HCT 33.7 Monitor closely.  5. Mild neutropenia due to chemo. Monitor WBC 3.5 ANC normal today at 1.8 (1.4 on  11/19/2020).   Disposition: Proceed with cycle 17 FOLFIRI. RTC in 2 weeks: Labs (CMP, CBC, CA 9-19), and MD assessment.   The patient's diagnosis, an outline of the further diagnostic and laboratory studies which will be required, the recommendation for surgery, and alternatives were discussed with her and her accompanying family members.  All questions were answered to their satisfaction.  I personally had a face to face interaction and evaluated the patient  jointly with the NP Student, Mrs. Benedetto Goad.  I have reviewed her history and available records and have performed the key portions of the physical exam including general, HEENT, abdominal exam, pelvic exam with my findings confirming those documented above by the APP student.  I have discussed the case with the APP student and the patient.  I agree with the above documentation, assessment and plan which was fully formulated by me.  Counseling was completed by me.    Benedetto Goad, Student FNP  Greater than 50% was spent in counseling and coordination of care with this patient including but not limited to discussion of the relevant topics above (See A&P) including, but not limited to diagnosis and management of acute and chronic medical conditions.   Faythe Casa, NP 12/03/2020 2:44 PM

## 2020-12-03 NOTE — Progress Notes (Signed)
Patient here for oncology follow-up appointment, expresses no complaints or concerns at this time.    

## 2020-12-03 NOTE — Patient Instructions (Signed)
[facility]  Discharge Instructions: Thank you for choosing [facility] to provide your oncology and hematology care.  If you have a lab appointment with the [facility], please go directly to the [facility] and check in at the registration area.  Wear comfortable clothing and clothing appropriate for easy access to any Portacath or PICC line.   We strive to give you quality time with your provider. You may need to reschedule your appointment if you arrive late (15 or more minutes).  Arriving late affects you and other patients whose appointments are after yours.  Also, if you miss three or more appointments without notifying the office, you may be dismissed from the clinic at the provider's discretion.      For prescription refill requests, have your pharmacy contact our office and allow 72 hours for refills to be completed.    Today you received the following chemotherapy and/or immunotherapy agents - fluorouracil, irinotecan      To help prevent nausea and vomiting after your treatment, we encourage you to take your nausea medication as directed.  BELOW ARE SYMPTOMS THAT SHOULD BE REPORTED IMMEDIATELY: . *FEVER GREATER THAN 100.4 F (38 C) OR HIGHER . *CHILLS OR SWEATING . *NAUSEA AND VOMITING THAT IS NOT CONTROLLED WITH YOUR NAUSEA MEDICATION . *UNUSUAL SHORTNESS OF BREATH . *UNUSUAL BRUISING OR BLEEDING . *URINARY PROBLEMS (pain or burning when urinating, or frequent urination) . *BOWEL PROBLEMS (unusual diarrhea, constipation, pain near the anus) . TENDERNESS IN MOUTH AND THROAT WITH OR WITHOUT PRESENCE OF ULCERS (sore throat, sores in mouth, or a toothache) . UNUSUAL RASH, SWELLING OR PAIN  . UNUSUAL VAGINAL DISCHARGE OR ITCHING   Items with * indicate a potential emergency and should be followed up as soon as possible or go to the Emergency Department if any problems should occur.  Please show the CHEMOTHERAPY ALERT CARD or  IMMUNOTHERAPY ALERT CARD at check-in to the Emergency Department and triage nurse.  Should you have questions after your visit or need to cancel or reschedule your appointment, please contact [facility]  [phone] and follow the prompts.  Office hours are 8:00 a.m. to 4:30 p.m. Monday - Friday. Please note that voicemails left after 4:00 p.m. may not be returned until the following business day.  We are closed weekends and major holidays. You have access to a nurse at all times for urgent questions. Please call the main number to the clinic [phone] and follow the prompts.  For any non-urgent questions, you may also contact your provider using MyChart. We now offer e-Visits for anyone 66 and older to request care online for non-urgent symptoms. For details visit [email or web address].   Also download the MyChart app! Go to the app store, search "MyChart", open the app, select [facility], and log in with your MyChart username and password.  Due to Covid, a mask is required upon entering the hospital/clinic. If you do not have a mask, one will be given to you upon arrival. For doctor visits, patients may have 1 support person aged 52 or older with them. For treatment visits, patients cannot have anyone with them due to current Covid guidelines and our immunocompromised population.   Fluorouracil, 5-FU injection What is this medicine? FLUOROURACIL, 5-FU (flure oh YOOR a sil) is a chemotherapy drug. It slows the growth of cancer cells. This medicine is used to treat many types of cancer like breast cancer, colon or rectal cancer, pancreatic  cancer, and stomach cancer. This medicine may be used for other purposes; ask your health care provider or pharmacist if you have questions. COMMON BRAND NAME(S): Adrucil What should I tell my health care provider before I take this medicine? They need to know if you have any of these conditions:  blood  disorders  dihydropyrimidine dehydrogenase (DPD) deficiency  infection (especially a virus infection such as chickenpox, cold sores, or herpes)  kidney disease  liver disease  malnourished, poor nutrition  recent or ongoing radiation therapy  an unusual or allergic reaction to fluorouracil, other chemotherapy, other medicines, foods, dyes, or preservatives  pregnant or trying to get pregnant  breast-feeding How should I use this medicine? This drug is given as an infusion or injection into a vein. It is administered in a hospital or clinic by a specially trained health care professional. Talk to your pediatrician regarding the use of this medicine in children. Special care may be needed. Overdosage: If you think you have taken too much of this medicine contact a poison control center or emergency room at once. NOTE: This medicine is only for you. Do not share this medicine with others. What if I miss a dose? It is important not to miss your dose. Call your doctor or health care professional if you are unable to keep an appointment. What may interact with this medicine? Do not take this medicine with any of the following medications:  live virus vaccines This medicine may also interact with the following medications:  medicines that treat or prevent blood clots like warfarin, enoxaparin, and dalteparin This list may not describe all possible interactions. Give your health care provider a list of all the medicines, herbs, non-prescription drugs, or dietary supplements you use. Also tell them if you smoke, drink alcohol, or use illegal drugs. Some items may interact with your medicine. What should I watch for while using this medicine? Visit your doctor for checks on your progress. This drug may make you feel generally unwell. This is not uncommon, as chemotherapy can affect healthy cells as well as cancer cells. Report any side effects. Continue your course of treatment even though  you feel ill unless your doctor tells you to stop. In some cases, you may be given additional medicines to help with side effects. Follow all directions for their use. Call your doctor or health care professional for advice if you get a fever, chills or sore throat, or other symptoms of a cold or flu. Do not treat yourself. This drug decreases your body's ability to fight infections. Try to avoid being around people who are sick. This medicine may increase your risk to bruise or bleed. Call your doctor or health care professional if you notice any unusual bleeding. Be careful brushing and flossing your teeth or using a toothpick because you may get an infection or bleed more easily. If you have any dental work done, tell your dentist you are receiving this medicine. Avoid taking products that contain aspirin, acetaminophen, ibuprofen, naproxen, or ketoprofen unless instructed by your doctor. These medicines may hide a fever. Do not become pregnant while taking this medicine. Women should inform their doctor if they wish to become pregnant or think they might be pregnant. There is a potential for serious side effects to an unborn child. Talk to your health care professional or pharmacist for more information. Do not breast-feed an infant while taking this medicine. Men should inform their doctor if they wish to father a child. This medicine  may lower sperm counts. Do not treat diarrhea with over the counter products. Contact your doctor if you have diarrhea that lasts more than 2 days or if it is severe and watery. This medicine can make you more sensitive to the sun. Keep out of the sun. If you cannot avoid being in the sun, wear protective clothing and use sunscreen. Do not use sun lamps or tanning beds/booths. What side effects may I notice from receiving this medicine? Side effects that you should report to your doctor or health care professional as soon as possible:  allergic reactions like skin  rash, itching or hives, swelling of the face, lips, or tongue  low blood counts - this medicine may decrease the number of white blood cells, red blood cells and platelets. You may be at increased risk for infections and bleeding.  signs of infection - fever or chills, cough, sore throat, pain or difficulty passing urine  signs of decreased platelets or bleeding - bruising, pinpoint red spots on the skin, black, tarry stools, blood in the urine  signs of decreased red blood cells - unusually weak or tired, fainting spells, lightheadedness  breathing problems  changes in vision  chest pain  mouth sores  nausea and vomiting  pain, swelling, redness at site where injected  pain, tingling, numbness in the hands or feet  redness, swelling, or sores on hands or feet  stomach pain  unusual bleeding Side effects that usually do not require medical attention (report to your doctor or health care professional if they continue or are bothersome):  changes in finger or toe nails  diarrhea  dry or itchy skin  hair loss  headache  loss of appetite  sensitivity of eyes to the light  stomach upset  unusually teary eyes This list may not describe all possible side effects. Call your doctor for medical advice about side effects. You may report side effects to FDA at 1-800-FDA-1088. Where should I keep my medicine? This drug is given in a hospital or clinic and will not be stored at home. NOTE: This sheet is a summary. It may not cover all possible information. If you have questions about this medicine, talk to your doctor, pharmacist, or health care provider.  2021 [employer]/Gold Standard (2019-05-15 15:00:03)  Irinotecan injection What is this medicine? IRINOTECAN (ir in oh TEE kan ) is a chemotherapy drug. It is used to treat colon and rectal cancer. This medicine may be used for other purposes; ask your health care provider or pharmacist if you have questions. COMMON BRAND  NAME(S): Camptosar What should I tell my health care provider before I take this medicine? They need to know if you have any of these conditions:  dehydration  diarrhea  infection (especially a virus infection such as chickenpox, cold sores, or herpes)  liver disease  low blood counts, like low white cell, platelet, or red cell counts  low levels of calcium, magnesium, or potassium in the blood  recent or ongoing radiation therapy  an unusual or allergic reaction to irinotecan, other medicines, foods, dyes, or preservatives  pregnant or trying to get pregnant  breast-feeding How should I use this medicine? This drug is given as an infusion into a vein. It is administered in a hospital or clinic by a specially trained health care professional. Talk to your pediatrician regarding the use of this medicine in children. Special care may be needed. Overdosage: If you think you have taken too much of this medicine contact a  poison control center or emergency room at once. NOTE: This medicine is only for you. Do not share this medicine with others. What if I miss a dose? It is important not to miss your dose. Call your doctor or health care professional if you are unable to keep an appointment. What may interact with this medicine? Do not take this medicine with any of the following medications:  cobicistat  itraconazole This medicine may interact with the following medications:  antiviral medicines for HIV or AIDS  certain antibiotics like rifampin or rifabutin  certain medicines for fungal infections like ketoconazole, posaconazole, and voriconazole  certain medicines for seizures like carbamazepine, phenobarbital, phenotoin  clarithromycin  gemfibrozil  nefazodone  St. John's Wort This list may not describe all possible interactions. Give your health care provider a list of all the medicines, herbs, non-prescription drugs, or dietary supplements you use. Also tell them  if you smoke, drink alcohol, or use illegal drugs. Some items may interact with your medicine. What should I watch for while using this medicine? Your condition will be monitored carefully while you are receiving this medicine. You will need important blood work done while you are taking this medicine. This drug may make you feel generally unwell. This is not uncommon, as chemotherapy can affect healthy cells as well as cancer cells. Report any side effects. Continue your course of treatment even though you feel ill unless your doctor tells you to stop. In some cases, you may be given additional medicines to help with side effects. Follow all directions for their use. You may get drowsy or dizzy. Do not drive, use machinery, or do anything that needs mental alertness until you know how this medicine affects you. Do not stand or sit up quickly, especially if you are an older patient. This reduces the risk of dizzy or fainting spells. Call your health care professional for advice if you get a fever, chills, or sore throat, or other symptoms of a cold or flu. Do not treat yourself. This medicine decreases your body's ability to fight infections. Try to avoid being around people who are sick. Avoid taking products that contain aspirin, acetaminophen, ibuprofen, naproxen, or ketoprofen unless instructed by your doctor. These medicines may hide a fever. This medicine may increase your risk to bruise or bleed. Call your doctor or health care professional if you notice any unusual bleeding. Be careful brushing and flossing your teeth or using a toothpick because you may get an infection or bleed more easily. If you have any dental work done, tell your dentist you are receiving this medicine. Do not become pregnant while taking this medicine or for 6 months after stopping it. Women should inform their health care professional if they wish to become pregnant or think they might be pregnant. Men should not father a  child while taking this medicine and for 3 months after stopping it. There is potential for serious side effects to an unborn child. Talk to your health care professional for more information. Do not breast-feed an infant while taking this medicine or for 7 days after stopping it. This medicine has caused ovarian failure in some women. This medicine may make it more difficult to get pregnant. Talk to your health care professional if you are concerned about your fertility. This medicine has caused decreased sperm counts in some men. This may make it more difficult to father a child. Talk to your health care professional if you are concerned about your fertility. What side  effects may I notice from receiving this medicine? Side effects that you should report to your doctor or health care professional as soon as possible:  allergic reactions like skin rash, itching or hives, swelling of the face, lips, or tongue  chest pain  diarrhea  flushing, runny nose, sweating during infusion  low blood counts - this medicine may decrease the number of white blood cells, red blood cells and platelets. You may be at increased risk for infections and bleeding.  nausea, vomiting  pain, swelling, warmth in the leg  signs of decreased platelets or bleeding - bruising, pinpoint red spots on the skin, black, tarry stools, blood in the urine  signs of infection - fever or chills, cough, sore throat, pain or difficulty passing urine  signs of decreased red blood cells - unusually weak or tired, fainting spells, lightheadedness Side effects that usually do not require medical attention (report to your doctor or health care professional if they continue or are bothersome):  constipation  hair loss  headache  loss of appetite  mouth sores  stomach pain This list may not describe all possible side effects. Call your doctor for medical advice about side effects. You may report side effects to FDA at  1-800-FDA-1088. Where should I keep my medicine? This drug is given in a hospital or clinic and will not be stored at home. NOTE: This sheet is a summary. It may not cover all possible information. If you have questions about this medicine, talk to your doctor, pharmacist, or health care provider.  2021 Elsevier/Gold Standard (2019-05-15 17:46:13)

## 2020-12-04 LAB — CANCER ANTIGEN 19-9: CA 19-9: 5 U/mL (ref 0–35)

## 2020-12-05 ENCOUNTER — Other Ambulatory Visit: Payer: Self-pay

## 2020-12-05 ENCOUNTER — Inpatient Hospital Stay: Payer: Medicare Other

## 2020-12-05 DIAGNOSIS — Z95828 Presence of other vascular implants and grafts: Secondary | ICD-10-CM

## 2020-12-05 DIAGNOSIS — Z5111 Encounter for antineoplastic chemotherapy: Secondary | ICD-10-CM | POA: Diagnosis not present

## 2020-12-05 MED ORDER — HEPARIN SOD (PORK) LOCK FLUSH 100 UNIT/ML IV SOLN
500.0000 [IU] | Freq: Once | INTRAVENOUS | Status: AC
  Administered 2020-12-05: 500 [IU] via INTRAVENOUS
  Filled 2020-12-05: qty 5

## 2020-12-05 MED ORDER — HEPARIN SOD (PORK) LOCK FLUSH 100 UNIT/ML IV SOLN
INTRAVENOUS | Status: AC
  Filled 2020-12-05: qty 5

## 2020-12-09 ENCOUNTER — Telehealth: Payer: Self-pay | Admitting: Oncology

## 2020-12-09 NOTE — Telephone Encounter (Signed)
Spoke with patient to make her aware of change in appt time on 6/22 per her request. Patient was agreeable.

## 2020-12-16 ENCOUNTER — Other Ambulatory Visit: Payer: Self-pay

## 2020-12-16 DIAGNOSIS — C259 Malignant neoplasm of pancreas, unspecified: Secondary | ICD-10-CM

## 2020-12-17 ENCOUNTER — Inpatient Hospital Stay (HOSPITAL_BASED_OUTPATIENT_CLINIC_OR_DEPARTMENT_OTHER): Payer: Medicare Other | Admitting: Oncology

## 2020-12-17 ENCOUNTER — Telehealth: Payer: Self-pay

## 2020-12-17 ENCOUNTER — Other Ambulatory Visit: Payer: PRIVATE HEALTH INSURANCE

## 2020-12-17 ENCOUNTER — Encounter: Payer: Self-pay | Admitting: Oncology

## 2020-12-17 ENCOUNTER — Ambulatory Visit: Payer: PRIVATE HEALTH INSURANCE | Admitting: Oncology

## 2020-12-17 ENCOUNTER — Inpatient Hospital Stay: Payer: Medicare Other

## 2020-12-17 VITALS — BP 120/80 | HR 64 | Temp 98.1°F | Resp 18 | Wt 123.3 lb

## 2020-12-17 DIAGNOSIS — Z5111 Encounter for antineoplastic chemotherapy: Secondary | ICD-10-CM | POA: Diagnosis not present

## 2020-12-17 DIAGNOSIS — D6481 Anemia due to antineoplastic chemotherapy: Secondary | ICD-10-CM | POA: Diagnosis not present

## 2020-12-17 DIAGNOSIS — G620 Drug-induced polyneuropathy: Secondary | ICD-10-CM | POA: Diagnosis not present

## 2020-12-17 DIAGNOSIS — C259 Malignant neoplasm of pancreas, unspecified: Secondary | ICD-10-CM

## 2020-12-17 DIAGNOSIS — T451X5A Adverse effect of antineoplastic and immunosuppressive drugs, initial encounter: Secondary | ICD-10-CM

## 2020-12-17 DIAGNOSIS — D696 Thrombocytopenia, unspecified: Secondary | ICD-10-CM

## 2020-12-17 LAB — COMPREHENSIVE METABOLIC PANEL
ALT: 47 U/L — ABNORMAL HIGH (ref 0–44)
AST: 45 U/L — ABNORMAL HIGH (ref 15–41)
Albumin: 3.9 g/dL (ref 3.5–5.0)
Alkaline Phosphatase: 104 U/L (ref 38–126)
Anion gap: 7 (ref 5–15)
BUN: 14 mg/dL (ref 8–23)
CO2: 27 mmol/L (ref 22–32)
Calcium: 8.8 mg/dL — ABNORMAL LOW (ref 8.9–10.3)
Chloride: 107 mmol/L (ref 98–111)
Creatinine, Ser: 0.71 mg/dL (ref 0.44–1.00)
GFR, Estimated: >60 mL/min (ref >60)
Glucose, Bld: 106 mg/dL — ABNORMAL HIGH (ref 70–99)
Potassium: 4.2 mmol/L (ref 3.5–5.1)
Sodium: 141 mmol/L (ref 135–145)
Total Bilirubin: 0.7 mg/dL (ref 0.3–1.2)
Total Protein: 6.3 g/dL — ABNORMAL LOW (ref 6.5–8.1)

## 2020-12-17 LAB — CBC WITH DIFFERENTIAL/PLATELET
Abs Immature Granulocytes: 0.01 10*3/uL (ref 0.00–0.07)
Basophils Absolute: 0 10*3/uL (ref 0.0–0.1)
Basophils Relative: 1 %
Eosinophils Absolute: 0.1 10*3/uL (ref 0.0–0.5)
Eosinophils Relative: 2 %
HCT: 33.6 % — ABNORMAL LOW (ref 36.0–46.0)
Hemoglobin: 11.6 g/dL — ABNORMAL LOW (ref 12.0–15.0)
Immature Granulocytes: 0 %
Lymphocytes Relative: 34 %
Lymphs Abs: 1.3 10*3/uL (ref 0.7–4.0)
MCH: 34.6 pg — ABNORMAL HIGH (ref 26.0–34.0)
MCHC: 34.5 g/dL (ref 30.0–36.0)
MCV: 100.3 fL — ABNORMAL HIGH (ref 80.0–100.0)
Monocytes Absolute: 0.5 10*3/uL (ref 0.1–1.0)
Monocytes Relative: 12 %
Neutro Abs: 1.9 10*3/uL (ref 1.7–7.7)
Neutrophils Relative %: 51 %
Platelets: 167 10*3/uL (ref 150–400)
RBC: 3.35 MIL/uL — ABNORMAL LOW (ref 3.87–5.11)
RDW: 12.8 % (ref 11.5–15.5)
WBC: 3.7 10*3/uL — ABNORMAL LOW (ref 4.0–10.5)
nRBC: 0 % (ref 0.0–0.2)

## 2020-12-17 MED ORDER — ATROPINE SULFATE 1 MG/ML IJ SOLN
0.5000 mg | Freq: Once | INTRAMUSCULAR | Status: AC
  Administered 2020-12-17: 0.5 mg via INTRAVENOUS
  Filled 2020-12-17: qty 1

## 2020-12-17 MED ORDER — SODIUM CHLORIDE 0.9 % IV SOLN
INTRAVENOUS | Status: DC
  Filled 2020-12-17: qty 250

## 2020-12-17 MED ORDER — SODIUM CHLORIDE 0.9 % IV SOLN
650.0000 mg | Freq: Once | INTRAVENOUS | Status: AC
  Administered 2020-12-17: 650 mg via INTRAVENOUS
  Filled 2020-12-17: qty 32.5

## 2020-12-17 MED ORDER — SODIUM CHLORIDE 0.9 % IV SOLN
150.0000 mg | Freq: Once | INTRAVENOUS | Status: AC
  Administered 2020-12-17: 150 mg via INTRAVENOUS
  Filled 2020-12-17: qty 150

## 2020-12-17 MED ORDER — PALONOSETRON HCL INJECTION 0.25 MG/5ML
0.2500 mg | Freq: Once | INTRAVENOUS | Status: AC
  Administered 2020-12-17: 0.25 mg via INTRAVENOUS
  Filled 2020-12-17: qty 5

## 2020-12-17 MED ORDER — SODIUM CHLORIDE 0.9 % IV SOLN
10.0000 mg | Freq: Once | INTRAVENOUS | Status: AC
  Administered 2020-12-17: 10 mg via INTRAVENOUS
  Filled 2020-12-17: qty 10

## 2020-12-17 MED ORDER — SODIUM CHLORIDE 0.9 % IV SOLN
150.0000 mg/m2 | Freq: Once | INTRAVENOUS | Status: AC
  Administered 2020-12-17: 260 mg via INTRAVENOUS
  Filled 2020-12-17: qty 13

## 2020-12-17 MED ORDER — DIPHENHYDRAMINE HCL 25 MG PO CAPS
25.0000 mg | ORAL_CAPSULE | Freq: Once | ORAL | Status: AC
  Administered 2020-12-17: 25 mg via ORAL
  Filled 2020-12-17: qty 1

## 2020-12-17 MED ORDER — SODIUM CHLORIDE 0.9 % IV SOLN
2400.0000 mg/m2 | INTRAVENOUS | Status: DC
  Administered 2020-12-17: 4000 mg via INTRAVENOUS
  Filled 2020-12-17: qty 80

## 2020-12-17 NOTE — Progress Notes (Signed)
Hematology/Oncology follow up note Cassandra [name] [phone] [phone]   Patient Care Team: Cassandra Crews, MD as PCP - General (Family Medicine) [name], Cassandra Aline, FNP (Family Medicine) Cassandra Jacks, RN as Oncology Nurse Navigator Cassandra Server, MD as Consulting Physician (Oncology)  REFERRING PROVIDER: [name], MD  CHIEF COMPLAINTS/REASON FOR VISIT:  Follow up for treatment of pancreatic adenocarcinoma  HISTORY OF PRESENTING ILLNESS:   Cassandra [name] is a  65 y.o.  female with PMH listed below was seen in consultation at the request of  Cassandra Crews, MD  for evaluation of pancreatic adenocarcinoma Patient initially presented with jaundice, transaminitis, bilirubin was 9.9.  CA 19-9 was 1874.  Patient also reports unintentional weight loss. 02/08/2020 MRI abdomen and MRCP with and without contrast was done at [name] which showed pancreatic head mass measuring up to 3 cm, with marked associated narrowing of the portal vein confluence.  SMA is preserved.  Marked intrahepatic and extrahepatic biliary duct dilatation as well as mild dilatation of the main pancreatic duct. Multiple hepatic masses highly concerning for metastatic disease.  Patient underwent EUS on 02/19/2020, which showed irregular mass identified in the pancreatic head, hypoechoic, measured 31mmx33mm, sonographic evidence concerning for invasion into the superior mesenteric artery.  There is no sign of significant abnormality in the main pancreatic duct.  Dilatation of common bile duct which measured up to 16 mm.  Region of celiac artery was visualized and showed no signs of significant abnormality.  No lymphadenopathy.  FNA showed adenocarcinoma.  02/19/2020, ERCP, malignant.  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  The stricture was treated with placement of wall flex metal stent.  Patient was  seen by [facility] oncology Dr. Pia Mau and was recommended for 3 drug regimen FOLFIRINOX.  Patient prefers to do chemotherapy locally at [facility].  Patient was referred to establish care today. She denies any pain.  Since stent placement, skin jaundice has improved.  Itchiness has also improved. Patient was accompanied by her husband today.  She has a family history of breast cancer in sister and paternal aunt, colon cancer paternal grandmother.  #no reportable targetable mutation on NGS 9/14/2021cycle 1 FOLFIRINOX.  Patient received oxaliplatin and about 50% of Irinotecan on day 1 and had experienced neurologic symptoms.  She went to ER and working diagnosis is TIA, and eventually I think this is due to irinotecan side effects -irinotecan-associated dysarthria, lip/tongue numbness.  Adjustment was made for Irinotecan to be  infused over 180 minutes.  Atropine 0.5 mg once prior to the irinotecan. No recurrent symptoms.   # 03/19/2020-03/21/2020 patient was admitted due to sepsis with strep pneumonia bacteremia.  Patient was treated with IV Rocephin.  TEE was done which showed no vegetation.  No PFO or ASD.  Patient was discharged home and he finished full course of 14 days of IV Rocephin on 04/02/2020 per ID recommendation.  Repeat blood culture was also negative.  08/25/20 cycle 10 FOLFIRINOX 09/08/20- present  Starting cycle 11, FOLFIRI, Oxaliplatin discontinued due to neuropathy   #NGS showed no reportable targetable mutation #Genetic testing-[employer] diagnostic testing showed no pathological variants identified. MS stable, TMB 64mut/mb, KRAS G12D, SF3B1 K700E, TP53 V255fs*30  INTERVAL HISTORY [name] is a 65 y.o. female who has above history reviewed by me today presents for evaluation prior to chemotherapy. Problems and complaints are listed below She feels well today. No new complaints. No nausea vomiting  diarrhea.   Review of Systems  Constitutional:  Negative for appetite change, chills, fatigue,  fever and unexpected weight change.  HENT:   Negative for hearing loss and voice change.   Eyes:  Negative for eye problems.  Respiratory:  Negative for chest tightness and cough.   Cardiovascular:  Negative for chest pain.  Gastrointestinal:  Negative for abdominal distention, abdominal pain and blood in stool.  Endocrine: Negative for hot flashes.  Genitourinary:  Negative for difficulty urinating and frequency.   Musculoskeletal:  Negative for arthralgias.  Skin:  Negative for itching and rash.  Neurological:  Positive for numbness. Negative for extremity weakness.  Hematological:  Negative for adenopathy.  Psychiatric/Behavioral:  Negative for confusion.    MEDICAL HISTORY:  Past Medical History:  Diagnosis Date   Cancer [facility])    pancreatic cancer   Colon polyps    Family history of breast cancer    Neutropenia ([place]) 04/18/2020   Osteopenia after menopause 05/2017   femoral neck T score -2.0    SURGICAL HISTORY: Past Surgical History:  Procedure Laterality Date   COLONOSCOPY  07/2015   WNL   COLONOSCOPY  2008/2011   OVARIAN CYST REMOVAL  1992   dermoid-Dr CAK   PORTA CATH INSERTION N/A 03/07/2020   Procedure: PORTA CATH INSERTION;  Surgeon: Algernon Huxley, MD;  Location: [place] [facility];  Service: Cardiovascular;  Laterality: N/A;   TEE WITHOUT CARDIOVERSION N/A 03/21/2020   Procedure: TRANSESOPHAGEAL ECHOCARDIOGRAM (TEE);  Surgeon: Dionisio David, MD;  Location: [facility] ORS;  Service: Cardiovascular;  Laterality: N/A;   TUBAL LIGATION  1993    SOCIAL HISTORY: Social History   Socioeconomic History   Marital status: Married    Spouse name: Not on file   Number of children: 2   Years of education: Not on file   Highest education level: Not on file  Occupational History   Occupation: Pharmacist, hospital    Comment: retired   Occupation: Psychologist, sport and exercise  Tobacco Use   Smoking status: Former    Packs/day: 0.50    Years: 10.00    Pack years: 5.00    Types: Cigarettes    Quit  date: 1987    Years since quitting: 35.4   Smokeless tobacco: Never   Tobacco comments:    Quit smoking 1987  Vaping Use   Vaping Use: Never used  Substance and Sexual Activity   Alcohol use: Not Currently    Comment: 0-2 mixed drinks a day   Drug use: No   Sexual activity: Not Currently    Birth control/protection: Post-menopausal  Other Topics Concern   Not on file  Social History Narrative   Not on file   Social Determinants of Health   Financial Resource Strain: Not on file  Food Insecurity: Not on file  Transportation Needs: Not on file  Physical Activity: Not on file  Stress: Not on file  Social Connections: Not on file  Intimate Partner Violence: Not on file    FAMILY HISTORY: Family History  Problem Relation Age of Onset   Breast cancer Paternal Aunt 54   Diabetes Mother    Osteoporosis Mother    Hyperlipidemia Father    Rheumatic fever Father    Valvular heart disease Father    Cancer Paternal Grandmother        possible colon   Breast cancer Sister 44    ALLERGIES:  is allergic to penicillin g.  MEDICATIONS:  Current Outpatient Medications  Medication Sig Dispense Refill  Calcium Carbonate-Vit D-Min (CALCIUM 1200 PO) Take by mouth.     Cholecalciferol 25 MCG (1000 UT) tablet Take 2,000 Units by mouth daily.     lidocaine-prilocaine (EMLA) cream Apply to affected area once (Patient taking differently: Apply 1 application topically as directed.) 30 g 3   loperamide (IMODIUM A-D) 2 MG tablet Take 2 at onset of diarrhea, then 1 every 2hrs until 12hr without a BM. May take 2 tab every 4hrs at bedtime. If diarrhea recurs repeat. 100 tablet 1   Multiple Vitamins-Minerals (MULTIVITAMIN WITH MINERALS) tablet Take 1 tablet by mouth daily.     ondansetron (ZOFRAN) 8 MG tablet Take 1 tablet (8 mg total) by mouth 2 (two) times daily as needed. Start on day 3 after chemotherapy. 30 tablet 1   potassium chloride SA (KLOR-CON M20) 20 MEQ tablet Take 1 tablet (20 mEq  total) by mouth daily. (Patient taking differently: Take 20 mEq by mouth daily. QOD) 30 tablet 0   prochlorperazine (COMPAZINE) 10 MG tablet Take 1 tablet (10 mg total) by mouth every 6 (six) hours as needed (Nausea or vomiting). 30 tablet 1   simethicone (GAS-X) 80 MG chewable tablet Chew 1 tablet (80 mg total) by mouth every 8 (eight) hours as needed for flatulence. 60 tablet 0   atorvastatin (LIPITOR) 40 MG tablet Take 1 tablet (40 mg total) by mouth daily. (Patient not taking: No sig reported) 30 tablet 3   magic mouthwash w/lidocaine SOLN Take 5 mLs by mouth 4 (four) times daily as needed for mouth pain. Sig: Swish & Spit 5-10 ml four times a day as needed. Dispense 480 ml. 1RF (Patient not taking: Reported on 12/17/2020) 480 mL 1   No current facility-administered medications for this visit.   Facility-Administered Medications Ordered in Other Visits  Medication Dose Route Frequency Provider Last Rate Last Admin   0.9 %  sodium chloride infusion   Intravenous Continuous Cassandra Server, MD   Stopped at 12/17/20 1424   fluorouracil (ADRUCIL) 4,000 mg in sodium chloride 0.9 % 70 mL chemo infusion  2,400 mg/m2 (Order-Specific) Intravenous 1 day or 1 dose Cassandra Server, MD   4,000 mg at 12/17/20 1430     PHYSICAL EXAMINATION: ECOG PERFORMANCE STATUS: 1 - Symptomatic but completely ambulatory Vitals:   12/17/20 0842  BP: 120/80  Pulse: 64  Resp: 18  Temp: 98.1 F (36.7 C)   Filed Weights   12/17/20 0842  Weight: 123 lb 4.8 oz (55.9 kg)    Physical Exam Constitutional:      General: She is not in acute distress. HENT:     Head: Normocephalic and atraumatic.  Eyes:     General: No scleral icterus. Cardiovascular:     Rate and Rhythm: Normal rate and regular rhythm.     Heart sounds: Normal heart sounds.  Pulmonary:     Effort: Pulmonary effort is normal. No respiratory distress.     Breath sounds: No wheezing.  Abdominal:     General: Bowel sounds are normal. There is no distension.      Palpations: Abdomen is soft.  Musculoskeletal:        General: No deformity. Normal range of motion.     Cervical back: Normal range of motion and neck supple.  Skin:    General: Skin is warm and dry.     Coloration: Skin is not jaundiced.     Findings: No erythema or rash.  Neurological:     Mental Status: She is alert and oriented  to person, place, and time. Mental status is at baseline.     Cranial Nerves: No cranial nerve deficit.     Coordination: Coordination normal.  Psychiatric:        Mood and Affect: Mood normal.    LABORATORY DATA:  I have reviewed the data as listed Lab Results  Component Value Date   WBC 3.7 (L) 12/17/2020   HGB 11.6 (L) 12/17/2020   HCT 33.6 (L) 12/17/2020   MCV 100.3 (H) 12/17/2020   PLT 167 12/17/2020   Recent Labs    02/06/20 1049 02/29/20 1314 03/19/20 1024 03/20/20 0411 03/21/20 0318 04/04/20 0827 11/19/20 0811 12/03/20 0922 12/17/20 0816  NA 137   < > 129* 136 140   < > 141 138 141  K 4.1   < > 3.8 4.0 3.7   < > 4.0 4.3 4.2  CL 99   < > 95* 104 108   < > 106 104 107  CO2 23   < > $R'25 25 25   'Cj$ < > $R'25 26 27  'oh$ GLUCOSE 113*   < > 145* 126* 114*   < > 96 84 106*  BUN 11   < > 11 7* 6*   < > $R'11 15 14  'GK$ CREATININE 0.80   < > 0.85 0.66 0.52   < > 0.65 0.51 0.71  CALCIUM 10.2   < > 8.9 8.3* 8.2*   < > 8.7* 9.1 8.8*  GFRNONAA 78   < > >60 >60 >60   < > >60 >60 >60  GFRAA 90   < > >60 >60 >60  --   --   --   --   PROT 7.0   < > 7.1  --  5.3*   < > 6.3* 6.6 6.3*  ALBUMIN 4.9*   < > 3.5  --  2.5*   < > 4.0 4.2 3.9  AST 195*   < > 30  --  52*   < > 51* 31 45*  ALT 366*   < > 43  --  74*   < > 38 41 47*  ALKPHOS 934*   < > 125  --  112   < > 109 100 104  BILITOT 9.9*   < > 1.5*  --  1.0   < > 0.6 0.4 0.7  BILIDIR 8.11*  --   --   --   --   --   --   --   --   IBILI 1.79*  --   --   --   --   --   --   --   --    < > = values in this interval not displayed.    Iron/TIBC/Ferritin/ %Sat No results found for: IRON, TIBC, FERRITIN, IRONPCTSAT     RADIOGRAPHIC STUDIES: I have personally reviewed the radiological images as listed and agreed with the findings in the report. Reviewed findings of MRI abdomen MRCP done at [facility]. CT CHEST W CONTRAST  Result Date: 10/31/2020 CLINICAL DATA:  Pancreatic cancer EXAM: CT CHEST WITH CONTRAST CT ABDOMEN WITH AND WITHOUT CONTRAST TECHNIQUE: Multidetector CT imaging of the chest was performed during intravenous contrast administration. Multidetector CT imaging of the abdomen was performed following the standard protocol before and during bolus administration of intravenous contrast. CONTRAST:  170mL OMNIPAQUE IOHEXOL 300 MG/ML  SOLN COMPARISON:  MR abdomen, 06/11/2020, CT chest, 03/13/2020 FINDINGS: CT CHEST FINDINGS Cardiovascular: Right chest port catheter. Scattered aortic atherosclerosis. Unchanged  enlargement of the tubular ascending thoracic aorta, measuring up to 4.2 x 4.2 cm. Normal heart size. No pericardial effusion. Mediastinum/Nodes: No enlarged mediastinal, hilar, or axillary lymph nodes. Thyroid gland, trachea, and esophagus demonstrate no significant findings. Lungs/Pleura: Lungs are clear. No pleural effusion or pneumothorax. Musculoskeletal: No chest wall mass or suspicious bone lesions identified. CT ABDOMEN FINDINGS Hepatobiliary: No solid liver abnormality is seen. No gallstones, gallbladder wall thickening, or biliary dilatation. Redemonstrated common bile duct stent with post stenting pneumobilia. Pancreas: Unchanged soft tissue fullness of the pancreatic head without discrete mass (series 14, image 116). There is mild atrophy of the distal pancreatic parenchyma. No pancreatic ductal dilatation or surrounding inflammatory changes. Spleen: Normal in size without significant abnormality. Adrenals/Urinary Tract: Adrenal glands are unremarkable. Kidneys are normal, without renal calculi, solid lesion, or hydronephrosis. Stomach/Bowel: Stomach is within normal limits. Appendix appears normal. No  evidence of bowel wall thickening, distention, or inflammatory changes. Vascular/Lymphatic: Aortic atherosclerosis. No enlarged abdominal lymph nodes. Other: No abdominal wall hernia or abnormality. No abdominopelvic ascites. Musculoskeletal: No acute or significant osseous findings. IMPRESSION: 1. Unchanged soft tissue fullness of the pancreatic head without discrete mass. There is mild atrophy of the distal pancreatic parenchyma. No pancreatic ductal dilatation or surrounding inflammatory changes. 2. Redemonstrated common bile duct stent with post stenting pneumobilia. 3. No evidence of lymphadenopathy or metastatic disease in the chest or abdomen. 4. 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 Recommend annual imaging followup by CTA or MRA. This recommendation follows 2010 ACCF/AHA/AATS/ACR/ASA/SCA/SCAI/SIR/STS/SVM Guidelines for the Diagnosis and Management of Patients with Thoracic Aortic Disease. Circulation. 2010; 121: H225-J505. Aortic aneurysm NOS (ICD10-I71.9) Aortic Atherosclerosis (ICD10-I70.0). Electronically Signed   By: Eddie Candle M.D.   On: 10/31/2020 11:59   CT PANCREAS ABD W/WO  Result Date: 10/31/2020 CLINICAL DATA:  Pancreatic cancer EXAM: CT CHEST WITH CONTRAST CT ABDOMEN WITH AND WITHOUT CONTRAST TECHNIQUE: Multidetector CT imaging of the chest was performed during intravenous contrast administration. Multidetector CT imaging of the abdomen was performed following the standard protocol before and during bolus administration of intravenous contrast. CONTRAST:  112mL OMNIPAQUE IOHEXOL 300 MG/ML  SOLN COMPARISON:  MR abdomen, 06/11/2020, CT chest, 03/13/2020 FINDINGS: CT CHEST FINDINGS Cardiovascular: Right chest port catheter. Scattered aortic atherosclerosis. 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 Normal heart size. No pericardial effusion. Mediastinum/Nodes: No enlarged mediastinal,  hilar, or axillary lymph nodes. Thyroid gland, trachea, and esophagus demonstrate no significant findings. Lungs/Pleura: Lungs are clear. No pleural effusion or pneumothorax. Musculoskeletal: No chest wall mass or suspicious bone lesions identified. CT ABDOMEN FINDINGS Hepatobiliary: No solid liver abnormality is seen. No gallstones, gallbladder wall thickening, or biliary dilatation. Redemonstrated common bile duct stent with post stenting pneumobilia. Pancreas: Unchanged soft tissue fullness of the pancreatic head without discrete mass (series 14, image 116). There is mild atrophy of the distal pancreatic parenchyma. No pancreatic ductal dilatation or surrounding inflammatory changes. Spleen: Normal in size without significant abnormality. Adrenals/Urinary Tract: Adrenal glands are unremarkable. Kidneys are normal, without renal calculi, solid lesion, or hydronephrosis. Stomach/Bowel: Stomach is within normal limits. Appendix appears normal. No evidence of bowel wall thickening, distention, or inflammatory changes. Vascular/Lymphatic: Aortic atherosclerosis. No enlarged abdominal lymph nodes. Other: No abdominal wall hernia or abnormality. No abdominopelvic ascites. Musculoskeletal: No acute or significant osseous findings. IMPRESSION: 1. Unchanged soft tissue fullness of the pancreatic head without discrete mass. There is mild atrophy of the distal pancreatic parenchyma.  No pancreatic ductal dilatation or surrounding inflammatory changes. 2. Redemonstrated common bile duct stent with post stenting pneumobilia. 3. No evidence of lymphadenopathy or metastatic disease in the chest or abdomen. 4. 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 Recommend annual imaging followup by CTA or MRA. This recommendation follows 2010 ACCF/AHA/AATS/ACR/ASA/SCA/SCAI/SIR/STS/SVM Guidelines for the Diagnosis and Management of Patients with Thoracic Aortic Disease. Circulation. 2010;  121: Y101-B510. Aortic aneurysm NOS (ICD10-I71.9) Aortic Atherosclerosis (ICD10-I70.0). Electronically Signed   By: Eddie Candle M.D.   On: 10/31/2020 11:59      ASSESSMENT & PLAN:  1. 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 Anemia due to antineoplastic chemotherapy   4. Encounter for antineoplastic chemotherapy   5. 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 Labs are reviewed and discussed with patient.  proceed with FOLFIRI today. She prefers to slightly delay next chemotherapy for about 1 week and I think it is ok given that her disease is controlled well.  Refer to [place] for second opinion.  #Elevated LFT, mild, monitor.    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   Patient follows up with neurology for management..  Continue acupuncture.  #Chemotherapy-induced anemia.  Hemoglobin is stable at 11.6 monitor closely.  Supportive care measures are necessary for patient well-being and will be provided as necessary. We spent sufficient time to discuss many aspect of care, questions were answered to patient's satisfaction.   All questions were answered. The patient knows to call the clinic with any problems questions or concerns.  Return of visit: 2 weeks  Cassandra Server, MD, PhD Hematology Oncology [name] at [name] Pager- [phone] 12/17/2020

## 2020-12-17 NOTE — Progress Notes (Signed)
Patient denies new problems/concerns today.   °

## 2020-12-17 NOTE — Patient Instructions (Signed)
[facility]  Discharge Instructions: Thank you for choosing [facility] to provide your oncology and hematology care.  If you have a lab appointment with the [facility], please go directly to the [facility] and check in at the registration area.  Wear comfortable clothing and clothing appropriate for easy access to any Portacath or PICC line.   We strive to give you quality time with your provider. You may need to reschedule your appointment if you arrive late (15 or more minutes).  Arriving late affects you and other patients whose appointments are after yours.  Also, if you miss three or more appointments without notifying the office, you may be dismissed from the clinic at the provider's discretion.      For prescription refill requests, have your pharmacy contact our office and allow 72 hours for refills to be completed.    Today you received the following chemotherapy and/or immunotherapy agents       To help prevent nausea and vomiting after your treatment, we encourage you to take your nausea medication as directed.  BELOW ARE SYMPTOMS THAT SHOULD BE REPORTED IMMEDIATELY: *FEVER GREATER THAN 100.4 F (38 C) OR HIGHER *CHILLS OR SWEATING *NAUSEA AND VOMITING THAT IS NOT CONTROLLED WITH YOUR NAUSEA MEDICATION *UNUSUAL SHORTNESS OF BREATH *UNUSUAL BRUISING OR BLEEDING *URINARY PROBLEMS (pain or burning when urinating, or frequent urination) *BOWEL PROBLEMS (unusual diarrhea, constipation, pain near the anus) TENDERNESS IN MOUTH AND THROAT WITH OR WITHOUT PRESENCE OF ULCERS (sore throat, sores in mouth, or a toothache) UNUSUAL RASH, SWELLING OR PAIN  UNUSUAL VAGINAL DISCHARGE OR ITCHING   Items with * indicate a potential emergency and should be followed up as soon as possible or go to the Emergency Department if any problems should occur.  Please show the CHEMOTHERAPY ALERT CARD or IMMUNOTHERAPY ALERT CARD at check-in to the  Emergency Department and triage nurse.  Should you have questions after your visit or need to cancel or reschedule your appointment, please contact [facility]  [phone] and follow the prompts.  Office hours are 8:00 a.m. to 4:30 p.m. Monday - Friday. Please note that voicemails left after 4:00 p.m. may not be returned until the following business day.  We are closed weekends and major holidays. You have access to a nurse at all times for urgent questions. Please call the main number to the clinic [phone] and follow the prompts.  For any non-urgent questions, you may also contact your provider using MyChart. We now offer e-Visits for anyone 6 and older to request care online for non-urgent symptoms. For details visit [email or web address].   Also download the MyChart app! Go to the app store, search "MyChart", open the app, select [facility], and log in with your MyChart username and password.  Due to Covid, a mask is required upon entering the hospital/clinic. If you do not have a mask, one will be given to you upon arrival. For doctor visits, patients may have 1 support person aged 87 or older with them. For treatment visits, patients cannot have anyone with them due to current Covid guidelines and our immunocompromised population. Fluorouracil, 5-FU injection What is this medication? FLUOROURACIL, 5-FU (flure oh YOOR a sil) is a chemotherapy drug. It slows the growth of cancer cells. This medicine is used to treat many types of cancer like breast cancer, colon or rectal cancer, pancreatic cancer, and stomachcancer. This medicine may be used for other purposes; ask your health  care provider orpharmacist if you have questions. COMMON BRAND NAME(S): Adrucil What should I tell my care team before I take this medication? They need to know if you have any of these conditions: blood disorders dihydropyrimidine dehydrogenase (DPD) deficiency infection  (especially a virus infection such as chickenpox, cold sores, or herpes) kidney disease liver disease malnourished, poor nutrition recent or ongoing radiation therapy an unusual or allergic reaction to fluorouracil, other chemotherapy, other medicines, foods, dyes, or preservatives pregnant or trying to get pregnant breast-feeding How should I use this medication? This drug is given as an infusion or injection into a vein. It is administeredin a hospital or clinic by a specially trained health care professional. Talk to your pediatrician regarding the use of this medicine in children.Special care may be needed. Overdosage: If you think you have taken too much of this medicine contact apoison control center or emergency room at once. NOTE: This medicine is only for you. Do not share this medicine with others. What if I miss a dose? It is important not to miss your dose. Call your doctor or health careprofessional if you are unable to keep an appointment. What may interact with this medication? Do not take this medicine with any of the following medications: live virus vaccines This medicine may also interact with the following medications: medicines that treat or prevent blood clots like warfarin, enoxaparin, and dalteparin This list may not describe all possible interactions. Give your health care provider a list of all the medicines, herbs, non-prescription drugs, or dietary supplements you use. Also tell them if you smoke, drink alcohol, or use illegaldrugs. Some items may interact with your medicine. What should I watch for while using this medication? Visit your doctor for checks on your progress. This drug may make you feel generally unwell. This is not uncommon, as chemotherapy can affect healthy cells as well as cancer cells. Report any side effects. Continue your course oftreatment even though you feel ill unless your doctor tells you to stop. In some cases, you may be given additional  medicines to help with side effects.Follow all directions for their use. Call your doctor or health care professional for advice if you get a fever, chills or sore throat, or other symptoms of a cold or flu. Do not treat yourself. This drug decreases your body's ability to fight infections. Try toavoid being around people who are sick. This medicine may increase your risk to bruise or bleed. Call your doctor orhealth care professional if you notice any unusual bleeding. Be careful brushing and flossing your teeth or using a toothpick because you may get an infection or bleed more easily. If you have any dental work done,tell your dentist you are receiving this medicine. Avoid taking products that contain aspirin, acetaminophen, ibuprofen, naproxen, or ketoprofen unless instructed by your doctor. These medicines may hide afever. Do not become pregnant while taking this medicine. Women should inform their doctor if they wish to become pregnant or think they might be pregnant. There is a potential for serious side effects to an unborn child. Talk to your health care professional or pharmacist for more information. Do not breast-feed aninfant while taking this medicine. Men should inform their doctor if they wish to father a child. This medicinemay lower sperm counts. Do not treat diarrhea with over the counter products. Contact your doctor ifyou have diarrhea that lasts more than 2 days or if it is severe and watery. This medicine can make you more sensitive to the sun.  Keep out of the sun. If you cannot avoid being in the sun, wear protective clothing and use sunscreen.Do not use sun lamps or tanning beds/booths. What side effects may I notice from receiving this medication? Side effects that you should report to your doctor or health care professionalas soon as possible: allergic reactions like skin rash, itching or hives, swelling of the face, lips, or tongue low blood counts - this medicine may decrease  the number of white blood cells, red blood cells and platelets. You may be at increased risk for infections and bleeding. signs of infection - fever or chills, cough, sore throat, pain or difficulty passing urine signs of decreased platelets or bleeding - bruising, pinpoint red spots on the skin, black, tarry stools, blood in the urine signs of decreased red blood cells - unusually weak or tired, fainting spells, lightheadedness breathing problems changes in vision chest pain mouth sores nausea and vomiting pain, swelling, redness at site where injected pain, tingling, numbness in the hands or feet redness, swelling, or sores on hands or feet stomach pain unusual bleeding Side effects that usually do not require medical attention (report to yourdoctor or health care professional if they continue or are bothersome): changes in finger or toe nails diarrhea dry or itchy skin hair loss headache loss of appetite sensitivity of eyes to the light stomach upset unusually teary eyes This list may not describe all possible side effects. Call your doctor for medical advice about side effects. You may report side effects to FDA at1-800-FDA-1088. Where should I keep my medication? This drug is given in a hospital or clinic and will not be stored at home. NOTE: This sheet is a summary. It may not cover all possible information. If you have questions about this medicine, talk to your doctor, pharmacist, orhealth care provider.  2022 Elsevier/Gold Standard (2019-05-15 15:00:03) Leucovorin injection What is this medication? LEUCOVORIN (loo koe VOR in) is used to prevent or treat the harmful effects of some medicines. This medicine is used to treat anemia caused by a low amount of folic acid in the body. It is also used with 5-fluorouracil (5-FU) to treatcolon cancer. This medicine may be used for other purposes; ask your health care provider orpharmacist if you have questions. What should I tell my  care team before I take this medication? They need to know if you have any of these conditions: anemia from low levels of vitamin B-12 in the blood an unusual or allergic reaction to leucovorin, folic acid, other medicines, foods, dyes, or preservatives pregnant or trying to get pregnant breast-feeding How should I use this medication? This medicine is for injection into a muscle or into a vein. It is given by ahealth care professional in a hospital or clinic setting. Talk to your pediatrician regarding the use of this medicine in children.Special care may be needed. Overdosage: If you think you have taken too much of this medicine contact apoison control center or emergency room at once. NOTE: This medicine is only for you. Do not share this medicine with others. What if I miss a dose? This does not apply. What may interact with this medication? capecitabine fluorouracil phenobarbital phenytoin primidone trimethoprim-sulfamethoxazole This list may not describe all possible interactions. Give your health care provider a list of all the medicines, herbs, non-prescription drugs, or dietary supplements you use. Also tell them if you smoke, drink alcohol, or use illegaldrugs. Some items may interact with your medicine. What should I watch for while using this  medication? Your condition will be monitored carefully while you are receiving thismedicine. This medicine may increase the side effects of 5-fluorouracil, 5-FU. Tell your doctor or health care professional if you have diarrhea or mouth sores that donot get better or that get worse. What side effects may I notice from receiving this medication? Side effects that you should report to your doctor or health care professionalas soon as possible: allergic reactions like skin rash, itching or hives, swelling of the face, lips, or tongue breathing problems fever, infection mouth sores unusual bleeding or bruising unusually weak or  tired Side effects that usually do not require medical attention (report to yourdoctor or health care professional if they continue or are bothersome): constipation or diarrhea loss of appetite nausea, vomiting This list may not describe all possible side effects. Call your doctor for medical advice about side effects. You may report side effects to FDA at1-800-FDA-1088. Where should I keep my medication? This drug is given in a hospital or clinic and will not be stored at home. NOTE: This sheet is a summary. It may not cover all possible information. If you have questions about this medicine, talk to your doctor, pharmacist, orhealth care provider.  2022 Elsevier/Gold Standard (2007-12-19 16:50:29) Irinotecan injection What is this medication? IRINOTECAN (ir in oh TEE kan ) is a chemotherapy drug. It is used to treatcolon and rectal cancer. This medicine may be used for other purposes; ask your health care provider orpharmacist if you have questions. COMMON BRAND NAME(S): Camptosar What should I tell my care team before I take this medication? They need to know if you have any of these conditions: dehydration diarrhea infection (especially a virus infection such as chickenpox, cold sores, or herpes) liver disease low blood counts, like low white cell, platelet, or red cell counts low levels of calcium, magnesium, or potassium in the blood recent or ongoing radiation therapy an unusual or allergic reaction to irinotecan, other medicines, foods, dyes, or preservatives pregnant or trying to get pregnant breast-feeding How should I use this medication? This drug is given as an infusion into a vein. It is administered in a hospitalor clinic by a specially trained health care professional. Talk to your pediatrician regarding the use of this medicine in children.Special care may be needed. Overdosage: If you think you have taken too much of this medicine contact apoison control center or  emergency room at once. NOTE: This medicine is only for you. Do not share this medicine with others. What if I miss a dose? It is important not to miss your dose. Call your doctor or health careprofessional if you are unable to keep an appointment. What may interact with this medication? Do not take this medicine with any of the following medications: cobicistat itraconazole This medicine may interact with the following medications: antiviral medicines for HIV or AIDS certain antibiotics like rifampin or rifabutin certain medicines for fungal infections like ketoconazole, posaconazole, and voriconazole certain medicines for seizures like carbamazepine, phenobarbital, phenotoin clarithromycin gemfibrozil nefazodone St. John's Wort This list may not describe all possible interactions. Give your health care provider a list of all the medicines, herbs, non-prescription drugs, or dietary supplements you use. Also tell them if you smoke, drink alcohol, or use illegaldrugs. Some items may interact with your medicine. What should I watch for while using this medication? Your condition will be monitored carefully while you are receiving this medicine. You will need important blood work done while you are taking thismedicine. This drug may make  you feel generally unwell. This is not uncommon, as chemotherapy can affect healthy cells as well as cancer cells. Report any side effects. Continue your course of treatment even though you feel ill unless yourdoctor tells you to stop. In some cases, you may be given additional medicines to help with side effects.Follow all directions for their use. You may get drowsy or dizzy. Do not drive, use machinery, or do anything that needs mental alertness until you know how this medicine affects you. Do not stand or sit up quickly, especially if you are an older patient. This reducesthe risk of dizzy or fainting spells. Call your health care professional for advice if  you get a fever, chills, or sore throat, or other symptoms of a cold or flu. Do not treat yourself. This medicine decreases your body's ability to fight infections. Try to avoid beingaround people who are sick. Avoid taking products that contain aspirin, acetaminophen, ibuprofen, naproxen, or ketoprofen unless instructed by your doctor. These medicines may hide afever. This medicine may increase your risk to bruise or bleed. Call your doctor orhealth care professional if you notice any unusual bleeding. Be careful brushing and flossing your teeth or using a toothpick because you may get an infection or bleed more easily. If you have any dental work done,tell your dentist you are receiving this medicine. Do not become pregnant while taking this medicine or for 6 months after stopping it. Women should inform their health care professional if they wish to become pregnant or think they might be pregnant. Men should not father a child while taking this medicine and for 3 months after stopping it. There is potential for serious side effects to an unborn child. Talk to your health careprofessional for more information. Do not breast-feed an infant while taking this medicine or for 7 days afterstopping it. This medicine has caused ovarian failure in some women. This medicine may make it more difficult to get pregnant. Talk to your health care professional if [place] concerned about your fertility. This medicine has caused decreased sperm counts in some men. This may make it more difficult to father a child. Talk to your health care professional if [place] concerned about your fertility. What side effects may I notice from receiving this medication? Side effects that you should report to your doctor or health care professionalas soon as possible: allergic reactions like skin rash, itching or hives, swelling of the face, lips, or tongue chest pain diarrhea flushing, runny nose, sweating during infusion low blood  counts - this medicine may decrease the number of white blood cells, red blood cells and platelets. You may be at increased risk for infections and bleeding. nausea, vomiting pain, swelling, warmth in the leg signs of decreased platelets or bleeding - bruising, pinpoint red spots on the skin, black, tarry stools, blood in the urine signs of infection - fever or chills, cough, sore throat, pain or difficulty passing urine signs of decreased red blood cells - unusually weak or tired, fainting spells, lightheadedness Side effects that usually do not require medical attention (report to yourdoctor or health care professional if they continue or are bothersome): constipation hair loss headache loss of appetite mouth sores stomach pain This list may not describe all possible side effects. Call your doctor for medical advice about side effects. You may report side effects to FDA at1-800-FDA-1088. Where should I keep my medication? This drug is given in a hospital or clinic and will not be stored at home. NOTE: This sheet  is a summary. It may not cover all possible information. If you have questions about this medicine, talk to your doctor, pharmacist, orhealth care provider.  2022 Elsevier/Gold Standard (2019-05-15 17:46:13)

## 2020-12-17 NOTE — Telephone Encounter (Signed)
Records faxed to [facility]/Dr. Erich Montane for 2nd opinion of pancreatic cancer.  P:  [phone] F:  [phone]

## 2020-12-18 LAB — CANCER ANTIGEN 19-9: CA 19-9: 5 U/mL (ref 0–35)

## 2020-12-19 ENCOUNTER — Inpatient Hospital Stay: Payer: Medicare Other

## 2020-12-19 DIAGNOSIS — Z5111 Encounter for antineoplastic chemotherapy: Secondary | ICD-10-CM | POA: Diagnosis not present

## 2020-12-19 DIAGNOSIS — C259 Malignant neoplasm of pancreas, unspecified: Secondary | ICD-10-CM

## 2020-12-19 MED ORDER — HEPARIN SOD (PORK) LOCK FLUSH 100 UNIT/ML IV SOLN
500.0000 [IU] | Freq: Once | INTRAVENOUS | Status: AC | PRN
  Administered 2020-12-19: 500 [IU]
  Filled 2020-12-19: qty 5

## 2020-12-19 MED ORDER — SODIUM CHLORIDE 0.9% FLUSH
10.0000 mL | INTRAVENOUS | Status: DC | PRN
  Administered 2020-12-19: 10 mL
  Filled 2020-12-19: qty 10

## 2020-12-19 MED ORDER — HEPARIN SOD (PORK) LOCK FLUSH 100 UNIT/ML IV SOLN
INTRAVENOUS | Status: AC
  Filled 2020-12-19: qty 5

## 2020-12-22 ENCOUNTER — Encounter: Payer: Self-pay | Admitting: Oncology

## 2020-12-24 ENCOUNTER — Inpatient Hospital Stay: Admission: RE | Admit: 2020-12-24 | Payer: Medicare Other | Source: Ambulatory Visit

## 2020-12-31 ENCOUNTER — Encounter: Payer: Self-pay | Admitting: Oncology

## 2020-12-31 ENCOUNTER — Ambulatory Visit
Admission: RE | Admit: 2020-12-31 | Discharge: 2020-12-31 | Disposition: A | Discharge status: Home / Self Care | Payer: Medicare Other | Source: Ambulatory Visit | Attending: Family Medicine | Admitting: Family Medicine

## 2020-12-31 ENCOUNTER — Other Ambulatory Visit: Payer: Self-pay

## 2020-12-31 DIAGNOSIS — Z1231 Encounter for screening mammogram for malignant neoplasm of breast: Secondary | ICD-10-CM | POA: Diagnosis not present

## 2020-12-31 HISTORY — DX: Personal history of antineoplastic chemotherapy: Z92.21

## 2020-12-31 IMAGING — MG MM DIGITAL SCREENING BILAT W/ TOMO AND CAD
6 of 10 series · 6 of 30 positions shown · non-contrast
Comparison: Previous exam(s).

CLINICAL DATA: Screening.

EXAM:
DIGITAL SCREENING BILATERAL MAMMOGRAM WITH TOMOSYNTHESIS AND CAD
TECHNIQUE: Bilateral screening digital craniocaudal and mediolateral oblique
mammograms were obtained. Bilateral screening digital breast
tomosynthesis was performed. The images were evaluated with
computer-aided detection.

[R CC synth-2D (1 of 2)]
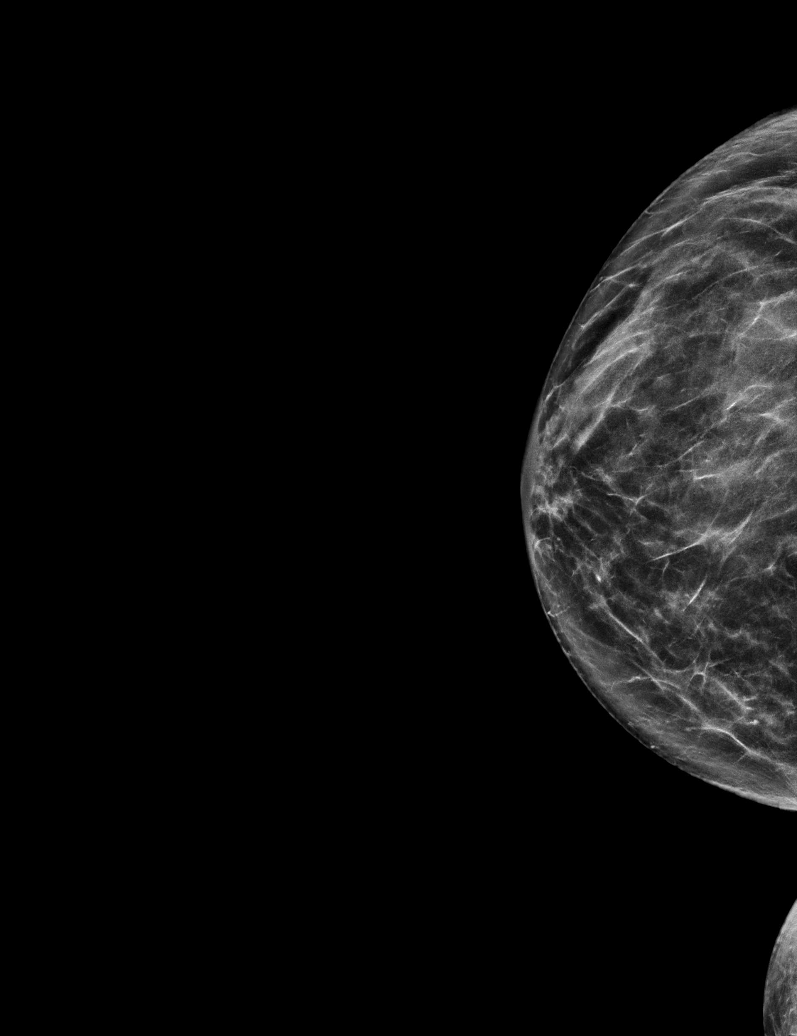

[L MLO synth-2D]
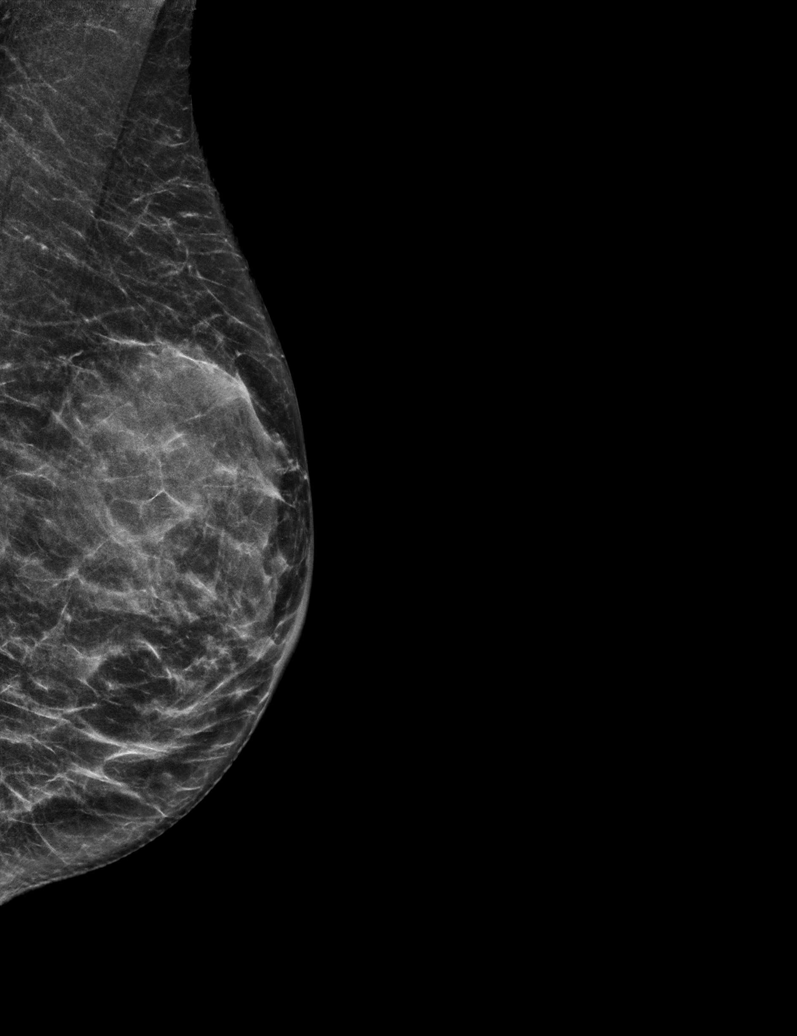

[L CC synth-2D]
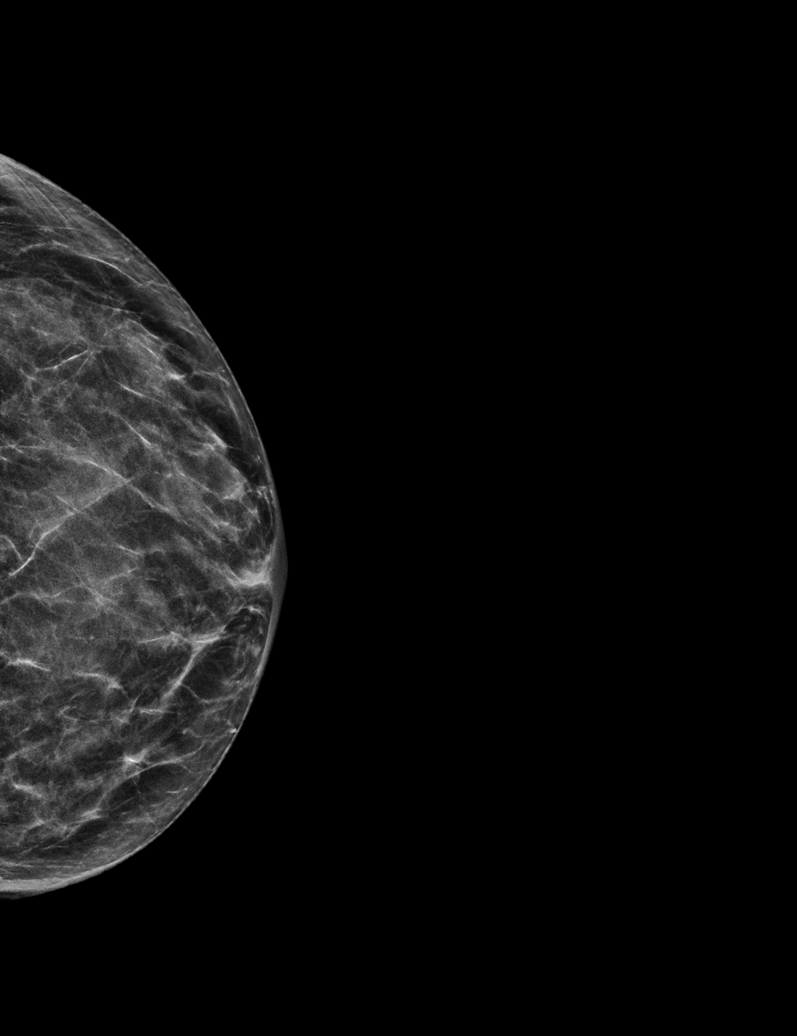

[R MLO synth-2D]
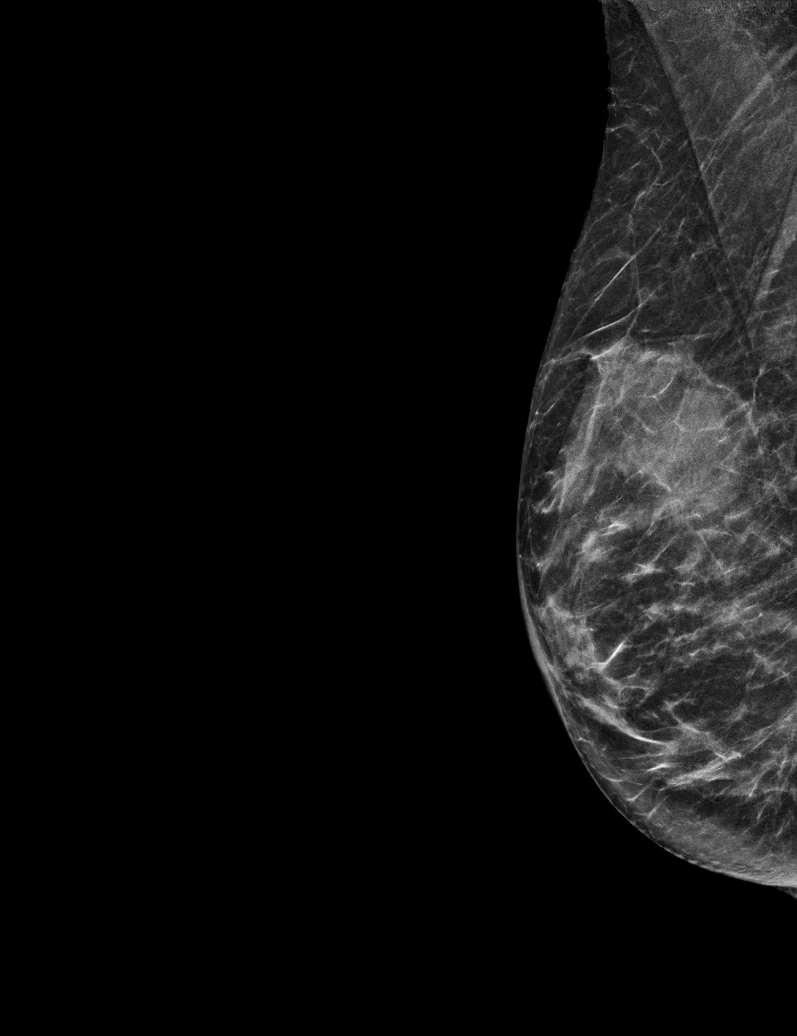

[R CC synth-2D (2 of 2)]
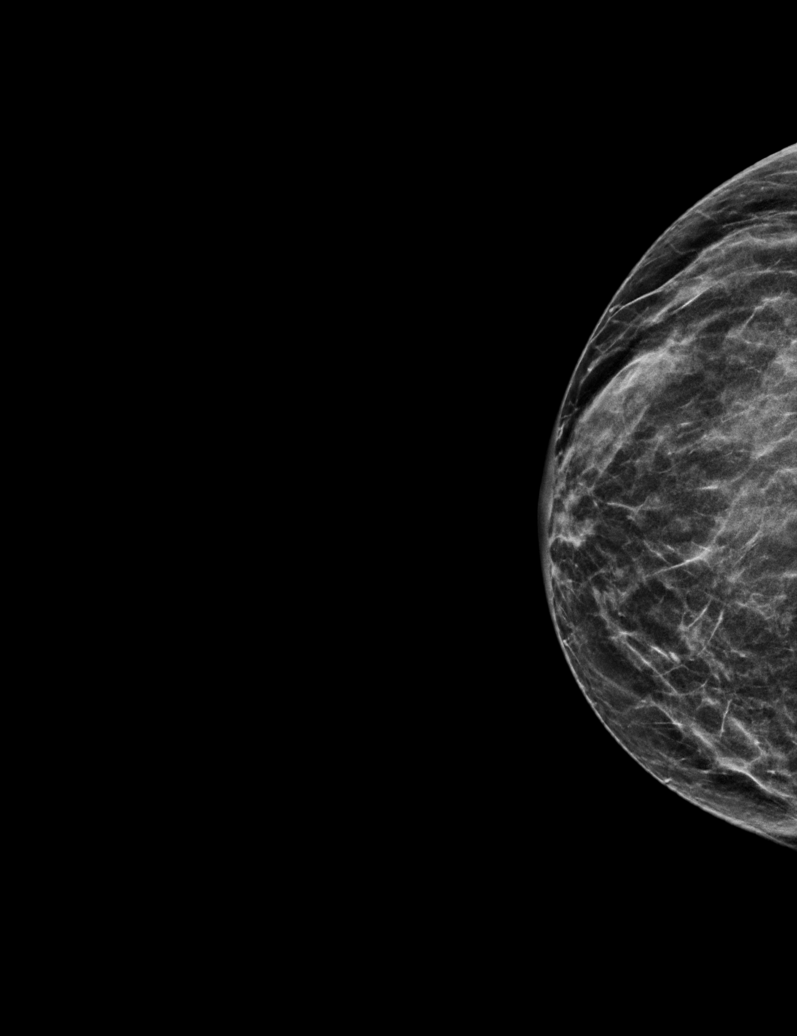

[L MLO tomo · tomo slice 25/50.0]
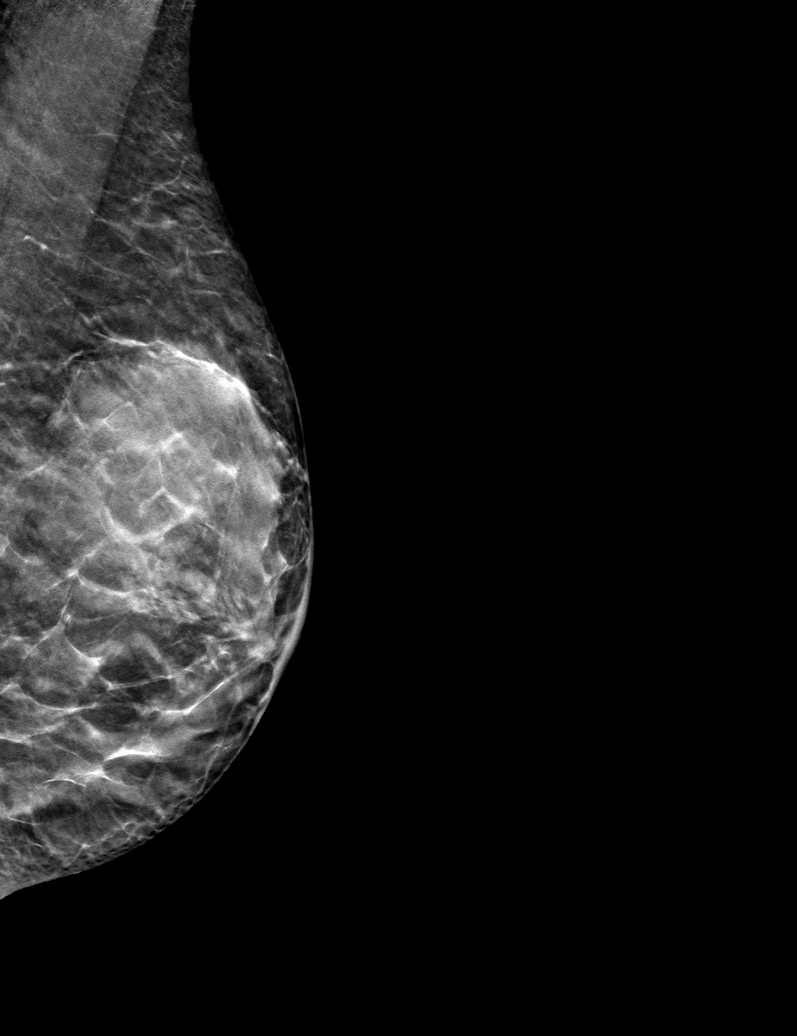

[6 of 30 positions shown; findings below may reference images not displayed]

ACR Breast Density Category c: The breast tissue is heterogeneously
dense, which may obscure small masses.
FINDINGS: There are no findings suspicious for malignancy.
IMPRESSION: No mammographic evidence of malignancy. A result letter of this
screening mammogram will be mailed directly to the patient.

RECOMMENDATION:
Screening mammogram in one year (as long as the patient has a life
expectancy of 10+ years with her diagnosis of pancreatic carcinoma).
(Code:[K1])

BI-RADS CATEGORY  1: Negative.

## 2021-01-06 ENCOUNTER — Inpatient Hospital Stay (HOSPITAL_BASED_OUTPATIENT_CLINIC_OR_DEPARTMENT_OTHER): Payer: Medicare Other | Admitting: Oncology

## 2021-01-06 ENCOUNTER — Encounter: Payer: Self-pay | Admitting: Oncology

## 2021-01-06 ENCOUNTER — Inpatient Hospital Stay: Payer: Medicare Other | Attending: Oncology

## 2021-01-06 ENCOUNTER — Inpatient Hospital Stay: Payer: Medicare Other

## 2021-01-06 VITALS — BP 121/84 | HR 61 | Temp 98.9°F | Resp 18 | Wt 123.0 lb

## 2021-01-06 DIAGNOSIS — Z5111 Encounter for antineoplastic chemotherapy: Secondary | ICD-10-CM

## 2021-01-06 DIAGNOSIS — C259 Malignant neoplasm of pancreas, unspecified: Secondary | ICD-10-CM | POA: Diagnosis not present

## 2021-01-06 DIAGNOSIS — Z95828 Presence of other vascular implants and grafts: Secondary | ICD-10-CM

## 2021-01-06 DIAGNOSIS — G620 Drug-induced polyneuropathy: Secondary | ICD-10-CM | POA: Diagnosis not present

## 2021-01-06 DIAGNOSIS — K8689 Other specified diseases of pancreas: Secondary | ICD-10-CM

## 2021-01-06 DIAGNOSIS — C787 Secondary malignant neoplasm of liver and intrahepatic bile duct: Secondary | ICD-10-CM | POA: Diagnosis present

## 2021-01-06 DIAGNOSIS — D6481 Anemia due to antineoplastic chemotherapy: Secondary | ICD-10-CM

## 2021-01-06 DIAGNOSIS — T451X5A Adverse effect of antineoplastic and immunosuppressive drugs, initial encounter: Secondary | ICD-10-CM

## 2021-01-06 DIAGNOSIS — Z452 Encounter for adjustment and management of vascular access device: Secondary | ICD-10-CM | POA: Insufficient documentation

## 2021-01-06 DIAGNOSIS — C250 Malignant neoplasm of head of pancreas: Secondary | ICD-10-CM | POA: Insufficient documentation

## 2021-01-06 DIAGNOSIS — D696 Thrombocytopenia, unspecified: Secondary | ICD-10-CM | POA: Diagnosis not present

## 2021-01-06 LAB — CBC WITH DIFFERENTIAL/PLATELET
Abs Immature Granulocytes: 0.01 10*3/uL (ref 0.00–0.07)
Basophils Absolute: 0 10*3/uL (ref 0.0–0.1)
Basophils Relative: 1 %
Eosinophils Absolute: 0.1 10*3/uL (ref 0.0–0.5)
Eosinophils Relative: 2 %
HCT: 34.9 % — ABNORMAL LOW (ref 36.0–46.0)
Hemoglobin: 11.9 g/dL — ABNORMAL LOW (ref 12.0–15.0)
Immature Granulocytes: 0 %
Lymphocytes Relative: 34 %
Lymphs Abs: 1.3 10*3/uL (ref 0.7–4.0)
MCH: 34.2 pg — ABNORMAL HIGH (ref 26.0–34.0)
MCHC: 34.1 g/dL (ref 30.0–36.0)
MCV: 100.3 fL — ABNORMAL HIGH (ref 80.0–100.0)
Monocytes Absolute: 0.5 10*3/uL (ref 0.1–1.0)
Monocytes Relative: 14 %
Neutro Abs: 1.8 10*3/uL (ref 1.7–7.7)
Neutrophils Relative %: 49 %
Platelets: 164 10*3/uL (ref 150–400)
RBC: 3.48 MIL/uL — ABNORMAL LOW (ref 3.87–5.11)
RDW: 13 % (ref 11.5–15.5)
WBC: 3.7 10*3/uL — ABNORMAL LOW (ref 4.0–10.5)
nRBC: 0 % (ref 0.0–0.2)

## 2021-01-06 LAB — COMPREHENSIVE METABOLIC PANEL
ALT: 45 U/L — ABNORMAL HIGH (ref 0–44)
AST: 42 U/L — ABNORMAL HIGH (ref 15–41)
Albumin: 4.1 g/dL (ref 3.5–5.0)
Alkaline Phosphatase: 99 U/L (ref 38–126)
Anion gap: 8 (ref 5–15)
BUN: 13 mg/dL (ref 8–23)
CO2: 25 mmol/L (ref 22–32)
Calcium: 9 mg/dL (ref 8.9–10.3)
Chloride: 106 mmol/L (ref 98–111)
Creatinine, Ser: 0.72 mg/dL (ref 0.44–1.00)
GFR, Estimated: >60 mL/min (ref >60)
Glucose, Bld: 109 mg/dL — ABNORMAL HIGH (ref 70–99)
Potassium: 4.1 mmol/L (ref 3.5–5.1)
Sodium: 139 mmol/L (ref 135–145)
Total Bilirubin: 0.5 mg/dL (ref 0.3–1.2)
Total Protein: 6.7 g/dL (ref 6.5–8.1)

## 2021-01-06 MED ORDER — SODIUM CHLORIDE 0.9 % IV SOLN
150.0000 mg/m2 | Freq: Once | INTRAVENOUS | Status: AC
Start: 2021-01-06 — End: 2021-01-06
  Administered 2021-01-06: 260 mg via INTRAVENOUS
  Filled 2021-01-06: qty 10

## 2021-01-06 MED ORDER — SODIUM CHLORIDE 0.9 % IV SOLN
2400.0000 mg/m2 | INTRAVENOUS | Status: DC
  Administered 2021-01-06: 4000 mg via INTRAVENOUS
  Filled 2021-01-06: qty 80

## 2021-01-06 MED ORDER — SODIUM CHLORIDE 0.9 % IV SOLN
150.0000 mg | Freq: Once | INTRAVENOUS | Status: AC
  Administered 2021-01-06: 150 mg via INTRAVENOUS
  Filled 2021-01-06: qty 150

## 2021-01-06 MED ORDER — CREON 24000-76000 UNITS PO CPEP: 1.0000 | ORAL_CAPSULE | Freq: Three times a day (TID) | ORAL | 0 refills | Status: DC

## 2021-01-06 MED ORDER — SODIUM CHLORIDE 0.9 % IV SOLN
650.0000 mg | Freq: Once | INTRAVENOUS | Status: AC
  Administered 2021-01-06: 650 mg via INTRAVENOUS
  Filled 2021-01-06: qty 32.5

## 2021-01-06 MED ORDER — SODIUM CHLORIDE 0.9 % IV SOLN: 650.0000 mg | Freq: Once | INTRAVENOUS | Status: DC

## 2021-01-06 MED ORDER — PALONOSETRON HCL INJECTION 0.25 MG/5ML
0.2500 mg | Freq: Once | INTRAVENOUS | Status: AC
  Administered 2021-01-06: 0.25 mg via INTRAVENOUS
  Filled 2021-01-06: qty 5

## 2021-01-06 MED ORDER — SODIUM CHLORIDE 0.9 % IV SOLN: 150.0000 mg/m2 | Freq: Once | INTRAVENOUS | Status: DC

## 2021-01-06 MED ORDER — SODIUM CHLORIDE 0.9 % IV SOLN
10.0000 mg | Freq: Once | INTRAVENOUS | Status: AC
  Administered 2021-01-06: 10 mg via INTRAVENOUS
  Filled 2021-01-06: qty 10

## 2021-01-06 MED ORDER — DIPHENHYDRAMINE HCL 25 MG PO CAPS
25.0000 mg | ORAL_CAPSULE | Freq: Once | ORAL | Status: AC
  Administered 2021-01-06: 25 mg via ORAL
  Filled 2021-01-06: qty 1

## 2021-01-06 MED ORDER — DEXTROSE 5 % IV SOLN
Freq: Once | INTRAVENOUS | Status: DC
  Filled 2021-01-06: qty 250

## 2021-01-06 MED ORDER — ATROPINE SULFATE 1 MG/ML IJ SOLN
0.5000 mg | Freq: Once | INTRAMUSCULAR | Status: AC
  Administered 2021-01-06: 0.5 mg via INTRAVENOUS
  Filled 2021-01-06: qty 1

## 2021-01-06 MED ORDER — SODIUM CHLORIDE 0.9 % IV SOLN
Freq: Once | INTRAVENOUS | Status: AC
  Filled 2021-01-06: qty 250

## 2021-01-06 NOTE — Patient Instructions (Addendum)
[facility]  Discharge Instructions: Thank you for choosing [facility] to provide your oncology and hematology care.  If you have a lab appointment with the [facility], please go directly to the [facility] and check in at the registration area.  Wear comfortable clothing and clothing appropriate for easy access to any Portacath or PICC line.   We strive to give you quality time with your provider. You may need to reschedule your appointment if you arrive late (15 or more minutes).  Arriving late affects you and other patients whose appointments are after yours.  Also, if you miss three or more appointments without notifying the office, you may be dismissed from the clinic at the provider's discretion.      For prescription refill requests, have your pharmacy contact our office and allow 72 hours for refills to be completed.    Today you received the following chemotherapy and/or immunotherapy agents : Leucovorin, Fluorouracil (Infusion Fluorouracil, 5-FU injection What is this medication? FLUOROURACIL, 5-FU (flure oh YOOR a sil) is a chemotherapy drug. It slows the growth of cancer cells. This medicine is used to treat many types of cancer like breast cancer, colon or rectal cancer, pancreatic cancer, and stomachcancer. This medicine may be used for other purposes; ask your health care provider orpharmacist if you have questions. COMMON BRAND NAME(S): Adrucil What should I tell my care team before I take this medication? They need to know if you have any of these conditions: blood disorders dihydropyrimidine dehydrogenase (DPD) deficiency infection (especially a virus infection such as chickenpox, cold sores, or herpes) kidney disease liver disease malnourished, poor nutrition recent or ongoing radiation therapy an unusual or allergic reaction to fluorouracil, other chemotherapy, other medicines, foods, dyes, or preservatives pregnant  or trying to get pregnant breast-feeding How should I use this medication? This drug is given as an infusion or injection into a vein. It is administeredin a hospital or clinic by a specially trained health care professional. Talk to your pediatrician regarding the use of this medicine in children.Special care may be needed. Overdosage: If you think you have taken too much of this medicine contact apoison control center or emergency room at once. NOTE: This medicine is only for you. Do not share this medicine with others. What if I miss a dose? It is important not to miss your dose. Call your doctor or health careprofessional if you are unable to keep an appointment. What may interact with this medication? Do not take this medicine with any of the following medications: live virus vaccines This medicine may also interact with the following medications: medicines that treat or prevent blood clots like warfarin, enoxaparin, and dalteparin This list may not describe all possible interactions. Give your health care provider a list of all the medicines, herbs, non-prescription drugs, or dietary supplements you use. Also tell them if you smoke, drink alcohol, or use illegaldrugs. Some items may interact with your medicine. What should I watch for while using this medication? Visit your doctor for checks on your progress. This drug may make you feel generally unwell. This is not uncommon, as chemotherapy can affect healthy cells as well as cancer cells. Report any side effects. Continue your course oftreatment even though you feel ill unless your doctor tells you to stop. In some cases, you may be given additional medicines to help with side effects.Follow all directions for their use. Call your doctor or health care professional for advice if you  get a fever, chills or sore throat, or other symptoms of a cold or flu. Do not treat yourself. This drug decreases your body's ability to fight infections. Try  toavoid being around people who are sick. This medicine may increase your risk to bruise or bleed. Call your doctor orhealth care professional if you notice any unusual bleeding. Be careful brushing and flossing your teeth or using a toothpick because you may get an infection or bleed more easily. If you have any dental work done,tell your dentist you are receiving this medicine. Avoid taking products that contain aspirin, acetaminophen, ibuprofen, naproxen, or ketoprofen unless instructed by your doctor. These medicines may hide afever. Do not become pregnant while taking this medicine. Women should inform their doctor if they wish to become pregnant or think they might be pregnant. There is a potential for serious side effects to an unborn child. Talk to your health care professional or pharmacist for more information. Do not breast-feed aninfant while taking this medicine. Men should inform their doctor if they wish to father a child. This medicinemay lower sperm counts. Do not treat diarrhea with over the counter products. Contact your doctor ifyou have diarrhea that lasts more than 2 days or if it is severe and watery. This medicine can make you more sensitive to the sun. Keep out of the sun. If you cannot avoid being in the sun, wear protective clothing and use sunscreen.Do not use sun lamps or tanning beds/booths. What side effects may I notice from receiving this medication? Side effects that you should report to your doctor or health care professionalas soon as possible: allergic reactions like skin rash, itching or hives, swelling of the face, lips, or tongue low blood counts - this medicine may decrease the number of white blood cells, red blood cells and platelets. You may be at increased risk for infections and bleeding. signs of infection - fever or chills, cough, sore throat, pain or difficulty passing urine signs of decreased platelets or bleeding - bruising, pinpoint red spots on the  skin, black, tarry stools, blood in the urine signs of decreased red blood cells - unusually weak or tired, fainting spells, lightheadedness breathing problems changes in vision chest pain mouth sores nausea and vomiting pain, swelling, redness at site where injected pain, tingling, numbness in the hands or feet redness, swelling, or sores on hands or feet stomach pain unusual bleeding Side effects that usually do not require medical attention (report to yourdoctor or health care professional if they continue or are bothersome): changes in finger or toe nails diarrhea dry or itchy skin hair loss headache loss of appetite sensitivity of eyes to the light stomach upset unusually teary eyes This list may not describe all possible side effects. Call your doctor for medical advice about side effects. You may report side effects to FDA at1-800-FDA-1088. Where should I keep my medication? This drug is given in a hospital or clinic and will not be stored at home. NOTE: This sheet is a summary. It may not cover all possible information. If you have questions about this medicine, talk to your doctor, pharmacist, orhealth care provider.  2022 Elsevier/Gold Standard (2019-05-15 15:00:03) Pump) and Irinotecan injection What is this medication? IRINOTECAN (ir in oh TEE kan ) is a chemotherapy drug. It is used to treatcolon and rectal cancer. This medicine may be used for other purposes; ask your health care provider orpharmacist if you have questions. COMMON BRAND NAME(S): Camptosar What should I tell my care team  before I take this medication? They need to know if you have any of these conditions: dehydration diarrhea infection (especially a virus infection such as chickenpox, cold sores, or herpes) liver diseaseLeucovorin injection What is this medication? LEUCOVORIN (loo koe VOR in) is used to prevent or treat the harmful effects of some medicines. This medicine is used to treat anemia  caused by a low amount of folic acid in the body. It is also used with 5-fluorouracil (5-FU) to treatcolon cancer. This medicine may be used for other purposes; ask your health care provider orpharmacist if you have questions. What should I tell my care team before I take this medication? They need to know if you have any of these conditions: anemia from low levels of vitamin B-12 in the blood an unusual or allergic reaction to leucovorin, folic acid, other medicines, foods, dyes, or preservatives pregnant or trying to get pregnant breast-feeding How should I use this medication? This medicine is for injection into a muscle or into a vein. It is given by ahealth care professional in a hospital or clinic setting. Talk to your pediatrician regarding the use of this medicine in children.Special care may be needed. Overdosage: If you think you have taken too much of this medicine contact apoison control center or emergency room at once. NOTE: This medicine is only for you. Do not share this medicine with others. What if I miss a dose? This does not apply. What may interact with this medication? capecitabine fluorouracil phenobarbital phenytoin primidone trimethoprim-sulfamethoxazole This list may not describe all possible interactions. Give your health care provider a list of all the medicines, herbs, non-prescription drugs, or dietary supplements you use. Also tell them if you smoke, drink alcohol, or use illegaldrugs. Some items may interact with your medicine. What should I watch for while using this medication? Your condition will be monitored carefully while you are receiving thismedicine. This medicine may increase the side effects of 5-fluorouracil, 5-FU. Tell your doctor or health care professional if you have diarrhea or mouth sores that donot get better or that get worse. What side effects may I notice from receiving this medication? Side effects that you should report to your doctor  or health care professionalas soon as possible: allergic reactions like skin rash, itching or hives, swelling of the face, lips, or tongue breathing problems fever, infection mouth sores unusual bleeding or bruising unusually weak or tired Side effects that usually do not require medical attention (report to yourdoctor or health care professional if they continue or are bothersome): constipation or diarrhea loss of appetite nausea, vomiting This list may not describe all possible side effects. Call your doctor for medical advice about side effects. You may report side effects to FDA at1-800-FDA-1088. Where should I keep my medication? This drug is given in a hospital or clinic and will not be stored at home. NOTE: This sheet is a summary. It may not cover all possible information. If you have questions about this medicine, talk to your doctor, pharmacist, orhealth care provider.  2022 Elsevier/Gold Standard (2007-12-19 16:50:29)  low blood counts, like low white cell, platelet, or red cell counts low levels of calcium, magnesium, or potassium in the blood recent or ongoing radiation therapy an unusual or allergic reaction to irinotecan, other medicines, foods, dyes, or preservatives pregnant or trying to get pregnant breast-feeding How should I use this medication? This drug is given as an infusion into a vein. It is administered in a hospitalor clinic by a specially trained  health care professional. Talk to your pediatrician regarding the use of this medicine in children.Special care may be needed. Overdosage: If you think you have taken too much of this medicine contact apoison control center or emergency room at once. NOTE: This medicine is only for you. Do not share this medicine with others. What if I miss a dose? It is important not to miss your dose. Call your doctor or health careprofessional if you are unable to keep an appointment. What may interact with this medication? Do  not take this medicine with any of the following medications: cobicistat itraconazole This medicine may interact with the following medications: antiviral medicines for HIV or AIDS certain antibiotics like rifampin or rifabutin certain medicines for fungal infections like ketoconazole, posaconazole, and voriconazole certain medicines for seizures like carbamazepine, phenobarbital, phenotoin clarithromycin gemfibrozil nefazodone [facility]. John's Wort This list may not describe all possible interactions. Give your health care provider a list of all the medicines, herbs, non-prescription drugs, or dietary supplements you use. Also tell them if you smoke, drink alcohol, or use illegaldrugs. Some items may interact with your medicine. What should I watch for while using this medication? Your condition will be monitored carefully while you are receiving this medicine. You will need important blood work done while you are taking thismedicine. This drug may make you feel generally unwell. This is not uncommon, as chemotherapy can affect healthy cells as well as cancer cells. Report any side effects. Continue your course of treatment even though you feel ill unless yourdoctor tells you to stop. In some cases, you may be given additional medicines to help with side effects.Follow all directions for their use. You may get drowsy or dizzy. Do not drive, use machinery, or do anything that needs mental alertness until you know how this medicine affects you. Do not stand or sit up quickly, especially if you are an older patient. This reducesthe risk of dizzy or fainting spells. Call your health care professional for advice if you get a fever, chills, or sore throat, or other symptoms of a cold or flu. Do not treat yourself. This medicine decreases your body's ability to fight infections. Try to avoid beingaround people who are sick. Avoid taking products that contain aspirin, acetaminophen, ibuprofen, naproxen, or  ketoprofen unless instructed by your doctor. These medicines may hide afever. This medicine may increase your risk to bruise or bleed. Call your doctor orhealth care professional if you notice any unusual bleeding. Be careful brushing and flossing your teeth or using a toothpick because you may get an infection or bleed more easily. If you have any dental work done,tell your dentist you are receiving this medicine. Do not become pregnant while taking this medicine or for 6 months after stopping it. Women should inform their health care professional if they wish to become pregnant or think they might be pregnant. Men should not father a child while taking this medicine and for 3 months after stopping it. There is potential for serious side effects to an unborn child. Talk to your health careprofessional for more information. Do not breast-feed an infant while taking this medicine or for 7 days afterstopping it. This medicine has caused ovarian failure in some women. This medicine may make it more difficult to get pregnant. Talk to your health care professional if [place] concerned about your fertility. This medicine has caused decreased sperm counts in some men. This may make it more difficult to father a child. Talk to your health care professional  if youare concerned about your fertility. What side effects may I notice from receiving this medication? Side effects that you should report to your doctor or health care professionalas soon as possible: allergic reactions like skin rash, itching or hives, swelling of the face, lips, or tongue chest pain diarrhea flushing, runny nose, sweating during infusion low blood counts - this medicine may decrease the number of white blood cells, red blood cells and platelets. You may be at increased risk for infections and bleeding. nausea, vomiting pain, swelling, warmth in the leg signs of decreased platelets or bleeding - bruising, pinpoint red spots on the skin,  black, tarry stools, blood in the urine signs of infection - fever or chills, cough, sore throat, pain or difficulty passing urine signs of decreased red blood cells - unusually weak or tired, fainting spells, lightheadedness Side effects that usually do not require medical attention (report to yourdoctor or health care professional if they continue or are bothersome): constipation hair loss headache loss of appetite mouth sores stomach pain This list may not describe all possible side effects. Call your doctor for medical advice about side effects. You may report side effects to FDA at1-800-FDA-1088. Where should I keep my medication? This drug is given in a hospital or clinic and will not be stored at home. NOTE: This sheet is a summary. It may not cover all possible information. If you have questions about this medicine, talk to your doctor, pharmacist, orhealth care provider.  2022 Elsevier/Gold Standard (2019-05-15 17:46:13)    To help prevent nausea and vomiting after your treatment, we encourage you to take your nausea medication as directed.  BELOW ARE SYMPTOMS THAT SHOULD BE REPORTED IMMEDIATELY: *FEVER GREATER THAN 100.4 F (38 C) OR HIGHER *CHILLS OR SWEATING *NAUSEA AND VOMITING THAT IS NOT CONTROLLED WITH YOUR NAUSEA MEDICATION *UNUSUAL SHORTNESS OF BREATH *UNUSUAL BRUISING OR BLEEDING *URINARY PROBLEMS (pain or burning when urinating, or frequent urination) *BOWEL PROBLEMS (unusual diarrhea, constipation, pain near the anus) TENDERNESS IN MOUTH AND THROAT WITH OR WITHOUT PRESENCE OF ULCERS (sore throat, sores in mouth, or a toothache) UNUSUAL RASH, SWELLING OR PAIN  UNUSUAL VAGINAL DISCHARGE OR ITCHING   Items with * indicate a potential emergency and should be followed up as soon as possible or go to the Emergency Department if any problems should occur.  Please show the CHEMOTHERAPY ALERT CARD or IMMUNOTHERAPY ALERT CARD at check-in to the Emergency Department and  triage nurse.  Should you have questions after your visit or need to cancel or reschedule your appointment, please contact [facility]  [phone] and follow the prompts.  Office hours are 8:00 a.m. to 4:30 p.m. Monday - Friday. Please note that voicemails left after 4:00 p.m. may not be returned until the following business day.  We are closed weekends and major holidays. You have access to a nurse at all times for urgent questions. Please call the main number to the clinic [phone] and follow the prompts.  For any non-urgent questions, you may also contact your provider using MyChart. We now offer e-Visits for anyone 23 and older to request care online for non-urgent symptoms. For details visit [email or web address].   Also download the MyChart app! Go to the app store, search "MyChart", open the app, select [facility], and log in with your MyChart username and password.  Due to Covid, a mask is required upon entering the hospital/clinic. If you do not have a mask, one will be given to you upon  arrival. For doctor visits, patients may have 1 support person aged 81 or older with them. For treatment visits, patients cannot have anyone with them due to current Covid guidelines and our immunocompromised population.

## 2021-01-06 NOTE — Progress Notes (Signed)
Hematology/Oncology follow up note [facility] [phone] [phone]   Patient Care Team: [name], MD as PCP - General (Family Medicine) Flinchum, Kelby Aline, FNP (Family Medicine) [name], RN as Oncology Nurse Navigator [name], MD as Consulting Physician (Oncology)  REFERRING PROVIDER: [name], MD  CHIEF COMPLAINTS/REASON FOR VISIT:  Follow up for treatment of pancreatic adenocarcinoma  HISTORY OF PRESENTING ILLNESS:   Cassandra [name] is a  65 y.o.  female with PMH listed below was seen in consultation at the request of  [name], MD  for evaluation of pancreatic adenocarcinoma Patient initially presented with jaundice, transaminitis, bilirubin was 9.9.  CA 19-9 was 1874.  Patient also reports unintentional weight loss. 02/08/2020 MRI abdomen and MRCP with and without contrast was done at [facility] which showed pancreatic head mass measuring up to 3 cm, with marked associated narrowing of the portal vein confluence.  SMA is preserved.  Marked intrahepatic and extrahepatic biliary duct dilatation as well as mild dilatation of the main pancreatic duct. Multiple hepatic masses highly concerning for metastatic disease.  Patient underwent EUS on 02/19/2020, which showed irregular mass identified in the pancreatic head, hypoechoic, measured 9mmx33mm, sonographic evidence concerning for invasion into the superior mesenteric artery.  There is no sign of significant abnormality in the main pancreatic duct.  Dilatation of common bile duct which measured up to 16 mm.  Region of celiac artery was visualized and showed no signs of significant abnormality.  No lymphadenopathy.  FNA showed adenocarcinoma.  02/19/2020, ERCP, malignant.  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  The stricture was treated with placement of wall flex metal stent.  Patient was  seen by [facility] oncology Dr. Pia Mau and was recommended for 3 drug regimen FOLFIRINOX.  Patient prefers to do chemotherapy locally at [facility].  Patient was referred to establish care today. She denies any pain.  Since stent placement, skin jaundice has improved.  Itchiness has also improved. Patient was accompanied by her husband today.  She has a family history of breast cancer in sister and paternal aunt, colon cancer paternal grandmother.  #no reportable targetable mutation on NGS 9/14/2021cycle 1 FOLFIRINOX.  Patient received oxaliplatin and about 50% of Irinotecan on day 1 and had experienced neurologic symptoms.  She went to ER and working diagnosis is TIA, and eventually I think this is due to irinotecan side effects -irinotecan-associated dysarthria, lip/tongue numbness.  Adjustment was made for Irinotecan to be  infused over 180 minutes.  Atropine 0.5 mg once prior to the irinotecan. No recurrent symptoms.   # 03/19/2020-03/21/2020 patient was admitted due to sepsis with strep pneumonia bacteremia.  Patient was treated with IV Rocephin.  TEE was done which showed no vegetation.  No PFO or ASD.  Patient was discharged home and he finished full course of 14 days of IV Rocephin on 04/02/2020 per ID recommendation.  Repeat blood culture was also negative.  08/25/20 cycle 10 FOLFIRINOX 09/08/20- present  Starting cycle 11, FOLFIRI, Oxaliplatin discontinued due to neuropathy   #NGS showed no reportable targetable mutation #Genetic testing-[employer] diagnostic testing showed no pathological variants identified. MS stable, TMB 49mut/mb, KRAS G12D, SF3B1 K700E, TP53 V248fs*30  INTERVAL HISTORY Cassandra [name] is a 65 y.o. female who has above history reviewed by me today presents for evaluation prior to chemotherapy. She feels well today. It is sweet corn harvest season, she and her husband have been  busy.  She took an extra week for chemo break and feels well.  During the interval she had second opinion at [facility]  with Dr.Jia. Has CT pancrease protocol scheduled at [facility] next week.  No new complaints. No nausea vomiting.  She reports to have intermittent oily loose bowel movements.   Review of Systems  Constitutional:  Negative for appetite change, chills, fatigue, fever and unexpected weight change.  HENT:   Negative for hearing loss and voice change.   Eyes:  Negative for eye problems.  Respiratory:  Negative for chest tightness and cough.   Cardiovascular:  Negative for chest pain.  Gastrointestinal:  Negative for abdominal distention, abdominal pain and blood in stool.  Endocrine: Negative for hot flashes.  Genitourinary:  Negative for difficulty urinating and frequency.   Musculoskeletal:  Negative for arthralgias.  Skin:  Negative for itching and rash.  Neurological:  Positive for numbness. Negative for extremity weakness.  Hematological:  Negative for adenopathy.  Psychiatric/Behavioral:  Negative for confusion.    MEDICAL HISTORY:  Past Medical History:  Diagnosis Date   Cancer [facility])    pancreatic cancer   Colon polyps    Family history of breast cancer    Neutropenia ([place]) 04/18/2020   Osteopenia after menopause 05/2017   femoral neck T score -2.0   Personal history of chemotherapy    current for pancreatic ca    SURGICAL HISTORY: Past Surgical History:  Procedure Laterality Date   COLONOSCOPY  07/2015   WNL   COLONOSCOPY  2008/2011   OVARIAN CYST REMOVAL  1992   dermoid-Dr CAK   PORTA CATH INSERTION N/A 03/07/2020   Procedure: PORTA CATH INSERTION;  Surgeon: Algernon Huxley, MD;  Location: [facility];  Service: Cardiovascular;  Laterality: N/A;   TEE WITHOUT CARDIOVERSION N/A 03/21/2020   Procedure: TRANSESOPHAGEAL ECHOCARDIOGRAM (TEE);  Surgeon: Dionisio David, MD;  Location: [facility] ORS;  Service: Cardiovascular;  Laterality: N/A;   TUBAL LIGATION  1993    SOCIAL HISTORY: Social History   Socioeconomic History   Marital status: Married    Spouse name: Not on  file   Number of children: 2   Years of education: Not on file   Highest education level: Not on file  Occupational History   Occupation: Pharmacist, hospital    Comment: retired   Occupation: Psychologist, sport and exercise  Tobacco Use   Smoking status: Former    Packs/day: 0.50    Years: 10.00    Pack years: 5.00    Types: Cigarettes    Quit date: 1987    Years since quitting: 35.5   Smokeless tobacco: Never   Tobacco comments:    Quit smoking 1987  Vaping Use   Vaping Use: Never used  Substance and Sexual Activity   Alcohol use: Not Currently    Comment: 0-2 mixed drinks a day   Drug use: No   Sexual activity: Not Currently    Birth control/protection: Post-menopausal  Other Topics Concern   Not on file  Social History Narrative   Not on file   Social Determinants of Health   Financial Resource Strain: Not on file  Food Insecurity: Not on file  Transportation Needs: Not on file  Physical Activity: Not on file  Stress: Not on file  Social Connections: Not on file  Intimate Partner Violence: Not on file    FAMILY HISTORY: Family History  Problem Relation Age of Onset   Breast cancer Paternal Aunt 68   Diabetes Mother  Osteoporosis Mother    Hyperlipidemia Father    Rheumatic fever Father    Valvular heart disease Father    Cancer Paternal Grandmother        possible colon   Breast cancer Sister 14    ALLERGIES:  is allergic to penicillin g.  MEDICATIONS:  Current Outpatient Medications  Medication Sig Dispense Refill   Calcium Carbonate-Vit D-Min (CALCIUM 1200 PO) Take by mouth.     Cholecalciferol 25 MCG (1000 UT) tablet Take 2,000 Units by mouth daily.     lidocaine-prilocaine (EMLA) cream Apply to affected area once (Patient taking differently: Apply 1 application topically as directed.) 30 g 3   Multiple Vitamins-Minerals (MULTIVITAMIN WITH MINERALS) tablet Take 1 tablet by mouth daily.     Pancrelipase, Lip-Prot-Amyl, (CREON) 24000-76000 units CPEP Take 1 capsule (24,000 Units  total) by mouth 3 (three) times daily before meals. 90 capsule 0   potassium chloride SA (KLOR-CON M20) 20 MEQ tablet Take 1 tablet (20 mEq total) by mouth daily. (Patient taking differently: Take 20 mEq by mouth daily. QOD) 30 tablet 0   atorvastatin (LIPITOR) 40 MG tablet Take 1 tablet (40 mg total) by mouth daily. (Patient not taking: No sig reported) 30 tablet 3   loperamide (IMODIUM A-D) 2 MG tablet Take 2 at onset of diarrhea, then 1 every 2hrs until 12hr without a BM. May take 2 tab every 4hrs at bedtime. If diarrhea recurs repeat. (Patient not taking: Reported on 01/06/2021) 100 tablet 1   magic mouthwash w/lidocaine SOLN Take 5 mLs by mouth 4 (four) times daily as needed for mouth pain. Sig: Swish & Spit 5-10 ml four times a day as needed. Dispense 480 ml. 1RF (Patient not taking: No sig reported) 480 mL 1   ondansetron (ZOFRAN) 8 MG tablet Take 1 tablet (8 mg total) by mouth 2 (two) times daily as needed. Start on day 3 after chemotherapy. (Patient not taking: Reported on 01/06/2021) 30 tablet 1   prochlorperazine (COMPAZINE) 10 MG tablet Take 1 tablet (10 mg total) by mouth every 6 (six) hours as needed (Nausea or vomiting). (Patient not taking: Reported on 01/06/2021) 30 tablet 1   simethicone (GAS-X) 80 MG chewable tablet Chew 1 tablet (80 mg total) by mouth every 8 (eight) hours as needed for flatulence. (Patient not taking: Reported on 01/06/2021) 60 tablet 0   No current facility-administered medications for this visit.   Facility-Administered Medications Ordered in Other Visits  Medication Dose Route Frequency Provider Last Rate Last Admin   0.9 %  sodium chloride infusion   Intravenous Once [name], MD       atropine injection 0.5 mg  0.5 mg Intravenous Once [name], MD       dexamethasone (DECADRON) 10 mg in sodium chloride 0.9 % 50 mL IVPB  10 mg Intravenous Once [name], MD       dextrose 5 % solution   Intravenous Once [name], MD       diphenhydrAMINE (BENADRYL) capsule 25 mg   25 mg Oral Once [name], MD       fluorouracil (ADRUCIL) 4,000 mg in sodium chloride 0.9 % 70 mL chemo infusion  2,400 mg/m2 (Order-Specific) Intravenous 1 day or 1 dose [name], MD       fosaprepitant (EMEND) 150 mg in sodium chloride 0.9 % 145 mL IVPB  150 mg Intravenous Once [name], MD       irinotecan (CAMPTOSAR) 260 mg in sodium chloride 0.9 % 500  mL chemo infusion  150 mg/m2 (Order-Specific) Intravenous Once [name], MD       leucovorin 650 mg in sodium chloride 0.9 % 250 mL infusion  650 mg Intravenous Once [name], MD       palonosetron (ALOXI) injection 0.25 mg  0.25 mg Intravenous Once [name], MD         PHYSICAL EXAMINATION: ECOG PERFORMANCE STATUS: 1 - Symptomatic but completely ambulatory Vitals:   01/06/21 0843  BP: 121/84  Pulse: 61  Resp: 18  Temp: 98.9 F (37.2 C)  SpO2: 100%   Filed Weights   01/06/21 0843  Weight: 123 lb (55.8 kg)    Physical Exam Constitutional:      General: She is not in acute distress. HENT:     Head: Normocephalic and atraumatic.  Eyes:     General: No scleral icterus. Cardiovascular:     Rate and Rhythm: Normal rate and regular rhythm.     Heart sounds: Normal heart sounds.  Pulmonary:     Effort: Pulmonary effort is normal. No respiratory distress.     Breath sounds: No wheezing.  Abdominal:     General: Bowel sounds are normal. There is no distension.     Palpations: Abdomen is soft.  Musculoskeletal:        General: No deformity. Normal range of motion.     Cervical back: Normal range of motion and neck supple.  Skin:    General: Skin is warm and dry.     Coloration: Skin is not jaundiced.     Findings: No erythema or rash.  Neurological:     Mental Status: She is alert and oriented to person, place, and time. Mental status is at baseline.     Cranial Nerves: No cranial nerve deficit.     Coordination: Coordination normal.  Psychiatric:        Mood and Affect: Mood normal.    LABORATORY DATA:  I have  reviewed the data as listed Lab Results  Component Value Date   WBC 3.7 (L) 01/06/2021   HGB 11.9 (L) 01/06/2021   HCT 34.9 (L) 01/06/2021   MCV 100.3 (H) 01/06/2021   PLT 164 01/06/2021   Recent Labs    02/06/20 1049 02/29/20 1314 03/19/20 1024 03/20/20 0411 03/21/20 0318 04/04/20 0827 12/03/20 0922 12/17/20 0816 01/06/21 0813  NA 137   < > 129* 136 140   < > 138 141 139  K 4.1   < > 3.8 4.0 3.7   < > 4.3 4.2 4.1  CL 99   < > 95* 104 108   < > 104 107 106  CO2 23   < > $R'25 25 25   'yG$ < > $R'26 27 25  'la$ GLUCOSE 113*   < > 145* 126* 114*   < > 84 106* 109*  BUN 11   < > 11 7* 6*   < > $R'15 14 13  'lI$ CREATININE 0.80   < > 0.85 0.66 0.52   < > 0.51 0.71 0.72  CALCIUM 10.2   < > 8.9 8.3* 8.2*   < > 9.1 8.8* 9.0  GFRNONAA 78   < > >60 >60 >60   < > >60 >60 >60  GFRAA 90   < > >60 >60 >60  --   --   --   --   PROT 7.0   < > 7.1  --  5.3*   < > 6.6 6.3* 6.7  ALBUMIN 4.9*   < >  3.5  --  2.5*   < > 4.2 3.9 4.1  AST 195*   < > 30  --  52*   < > 31 45* 42*  ALT 366*   < > 43  --  74*   < > 41 47* 45*  ALKPHOS 934*   < > 125  --  112   < > 100 104 99  BILITOT 9.9*   < > 1.5*  --  1.0   < > 0.4 0.7 0.5  BILIDIR 8.11*  --   --   --   --   --   --   --   --   IBILI 1.79*  --   --   --   --   --   --   --   --    < > = values in this interval not displayed.    Iron/TIBC/Ferritin/ %Sat No results found for: IRON, TIBC, FERRITIN, IRONPCTSAT    RADIOGRAPHIC STUDIES: I have personally reviewed the radiological images as listed and agreed with the findings in the report. Reviewed findings of MRI abdomen MRCP done at [facility]. CT CHEST W CONTRAST  Result Date: 10/31/2020 CLINICAL DATA:  Pancreatic cancer EXAM: CT CHEST WITH CONTRAST CT ABDOMEN WITH AND WITHOUT CONTRAST TECHNIQUE: Multidetector CT imaging of the chest was performed during intravenous contrast administration. Multidetector CT imaging of the abdomen was performed following the standard protocol before and during bolus administration of  intravenous contrast. CONTRAST:  122mL OMNIPAQUE IOHEXOL 300 MG/ML  SOLN COMPARISON:  MR abdomen, 06/11/2020, CT chest, 03/13/2020 FINDINGS: CT CHEST FINDINGS Cardiovascular: Right chest port catheter. Scattered aortic atherosclerosis. 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 Normal heart size. No pericardial effusion. Mediastinum/Nodes: No enlarged mediastinal, hilar, or axillary lymph nodes. Thyroid gland, trachea, and esophagus demonstrate no significant findings. Lungs/Pleura: Lungs are clear. No pleural effusion or pneumothorax. Musculoskeletal: No chest wall mass or suspicious bone lesions identified. CT ABDOMEN FINDINGS Hepatobiliary: No solid liver abnormality is seen. No gallstones, gallbladder wall thickening, or biliary dilatation. Redemonstrated common bile duct stent with post stenting pneumobilia. Pancreas: Unchanged soft tissue fullness of the pancreatic head without discrete mass (series 14, image 116). There is mild atrophy of the distal pancreatic parenchyma. No pancreatic ductal dilatation or surrounding inflammatory changes. Spleen: Normal in size without significant abnormality. Adrenals/Urinary Tract: Adrenal glands are unremarkable. Kidneys are normal, without renal calculi, solid lesion, or hydronephrosis. Stomach/Bowel: Stomach is within normal limits. Appendix appears normal. No evidence of bowel wall thickening, distention, or inflammatory changes. Vascular/Lymphatic: Aortic atherosclerosis. No enlarged abdominal lymph nodes. Other: No abdominal wall hernia or abnormality. No abdominopelvic ascites. Musculoskeletal: No acute or significant osseous findings. IMPRESSION: 1. Unchanged soft tissue fullness of the pancreatic head without discrete mass. There is mild atrophy of the distal pancreatic parenchyma. No pancreatic ductal dilatation or surrounding inflammatory changes. 2. Redemonstrated common bile duct stent with post stenting  pneumobilia. 3. No evidence of lymphadenopathy or metastatic disease in the chest or abdomen. 4. 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 Recommend annual imaging followup by CTA or MRA. This recommendation follows 2010 ACCF/AHA/AATS/ACR/ASA/SCA/SCAI/SIR/STS/SVM Guidelines for the Diagnosis and Management of Patients with Thoracic Aortic Disease. Circulation. 2010; 121: P591-M384. Aortic aneurysm NOS (ICD10-I71.9) Aortic Atherosclerosis (ICD10-I70.0). Electronically Signed   By: Eddie Candle M.D.   On: 10/31/2020 11:59   MM 3D SCREEN BREAST BILATERAL  Result Date: 01/02/2021  CLINICAL DATA:  Screening. EXAM: DIGITAL SCREENING BILATERAL MAMMOGRAM WITH TOMOSYNTHESIS AND CAD TECHNIQUE: Bilateral screening digital craniocaudal and mediolateral oblique mammograms were obtained. Bilateral screening digital breast tomosynthesis was performed. The images were evaluated with computer-aided detection. COMPARISON:  Previous exam(s). ACR Breast Density Category c: The breast tissue is heterogeneously dense, which may obscure small masses. FINDINGS: There are no findings suspicious for malignancy. IMPRESSION: No mammographic evidence of malignancy. A result letter of this screening mammogram will be mailed directly to the patient. RECOMMENDATION: Screening mammogram in one year (as long as the patient has a life expectancy of 10+ years with her diagnosis of pancreatic carcinoma). (Code:[id number]-B-01Y) BI-RADS CATEGORY  1: Negative. Electronically Signed   By: Evangeline Dakin M.D.   On: 01/02/2021 13:53  CT PANCREAS ABD W/WO  Result Date: 10/31/2020 CLINICAL DATA:  Pancreatic cancer EXAM: CT CHEST WITH CONTRAST CT ABDOMEN WITH AND WITHOUT CONTRAST TECHNIQUE: Multidetector CT imaging of the chest was performed during intravenous contrast administration. Multidetector CT imaging of the abdomen was performed following the standard protocol before and during bolus administration of  intravenous contrast. CONTRAST:  133mL OMNIPAQUE IOHEXOL 300 MG/ML  SOLN COMPARISON:  MR abdomen, 06/11/2020, CT chest, 03/13/2020 FINDINGS: CT CHEST FINDINGS Cardiovascular: Right chest port catheter. Scattered aortic atherosclerosis. 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 Normal heart size. No pericardial effusion. Mediastinum/Nodes: No enlarged mediastinal, hilar, or axillary lymph nodes. Thyroid gland, trachea, and esophagus demonstrate no significant findings. Lungs/Pleura: Lungs are clear. No pleural effusion or pneumothorax. Musculoskeletal: No chest wall mass or suspicious bone lesions identified. CT ABDOMEN FINDINGS Hepatobiliary: No solid liver abnormality is seen. No gallstones, gallbladder wall thickening, or biliary dilatation. Redemonstrated common bile duct stent with post stenting pneumobilia. Pancreas: Unchanged soft tissue fullness of the pancreatic head without discrete mass (series 14, image 116). There is mild atrophy of the distal pancreatic parenchyma. No pancreatic ductal dilatation or surrounding inflammatory changes. Spleen: Normal in size without significant abnormality. Adrenals/Urinary Tract: Adrenal glands are unremarkable. Kidneys are normal, without renal calculi, solid lesion, or hydronephrosis. Stomach/Bowel: Stomach is within normal limits. Appendix appears normal. No evidence of bowel wall thickening, distention, or inflammatory changes. Vascular/Lymphatic: Aortic atherosclerosis. No enlarged abdominal lymph nodes. Other: No abdominal wall hernia or abnormality. No abdominopelvic ascites. Musculoskeletal: No acute or significant osseous findings. IMPRESSION: 1. Unchanged soft tissue fullness of the pancreatic head without discrete mass. There is mild atrophy of the distal pancreatic parenchyma. No pancreatic ductal dilatation or surrounding inflammatory changes. 2. Redemonstrated common bile duct stent with post stenting  pneumobilia. 3. No evidence of lymphadenopathy or metastatic disease in the chest or abdomen. 4. 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 Recommend annual imaging followup by CTA or MRA. This recommendation follows 2010 ACCF/AHA/AATS/ACR/ASA/SCA/SCAI/SIR/STS/SVM Guidelines for the Diagnosis and Management of Patients with Thoracic Aortic Disease. Circulation. 2010; 121: W263-Z858. Aortic aneurysm NOS (ICD10-I71.9) Aortic Atherosclerosis (ICD10-I70.0). Electronically Signed   By: Eddie Candle M.D.   On: 10/31/2020 11:59      ASSESSMENT & PLAN:  1. 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 Anemia due to antineoplastic chemotherapy   4. Encounter for antineoplastic chemotherapy   5. Thrombocytopenia ([place])   6. Port-A-Cath in place   7. 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 Proceed with maintenance FOLFIRI today. CT abdomen with pancreaticobiliary protocol.  I discussed with Monson and we both agree with continuing maintenance therapy.   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  Recommend Creon,will start with 24000 units TID with meals.   #Elevated LFT, mild and chronic for her. 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   Patient follows up with neurology for management..  Continue acupuncture.  #Chemotherapy-induced anemia.  Hemoglobin is stable at 11.9 monitor closely.  Supportive care measures are necessary for patient well-being and will be provided as necessary. We spent sufficient time to discuss many aspect of care, questions  were answered to patient's satisfaction.   All questions were answered. The patient knows to call the clinic with any problems questions or concerns.  Return of visit: 2 weeks  [name], MD, PhD Hematology Oncology [facility] Pager- [phone] 01/06/2021

## 2021-01-07 LAB — CANCER ANTIGEN 19-9: CA 19-9: 7 U/mL (ref 0–35)

## 2021-01-07 LAB — CEA: CEA: 18.4 ng/mL — ABNORMAL HIGH (ref 0.0–4.7)

## 2021-01-08 ENCOUNTER — Inpatient Hospital Stay: Payer: Medicare Other

## 2021-01-08 DIAGNOSIS — Z5111 Encounter for antineoplastic chemotherapy: Secondary | ICD-10-CM | POA: Diagnosis not present

## 2021-01-08 DIAGNOSIS — C259 Malignant neoplasm of pancreas, unspecified: Secondary | ICD-10-CM

## 2021-01-08 MED ORDER — SODIUM CHLORIDE 0.9% FLUSH
10.0000 mL | INTRAVENOUS | Status: DC | PRN
  Administered 2021-01-08: 10 mL
  Filled 2021-01-08: qty 10

## 2021-01-08 MED ORDER — HEPARIN SOD (PORK) LOCK FLUSH 100 UNIT/ML IV SOLN
INTRAVENOUS | Status: AC
  Filled 2021-01-08: qty 5

## 2021-01-08 MED ORDER — HEPARIN SOD (PORK) LOCK FLUSH 100 UNIT/ML IV SOLN
500.0000 [IU] | Freq: Once | INTRAVENOUS | Status: AC | PRN
  Administered 2021-01-08: 500 [IU]
  Filled 2021-01-08: qty 5

## 2021-01-20 ENCOUNTER — Inpatient Hospital Stay: Payer: Medicare Other

## 2021-01-20 ENCOUNTER — Other Ambulatory Visit: Payer: Self-pay

## 2021-01-20 ENCOUNTER — Encounter: Payer: Self-pay | Admitting: Oncology

## 2021-01-20 ENCOUNTER — Inpatient Hospital Stay (HOSPITAL_BASED_OUTPATIENT_CLINIC_OR_DEPARTMENT_OTHER): Payer: Medicare Other | Admitting: Oncology

## 2021-01-20 VITALS — BP 131/86 | HR 59 | Temp 98.8°F | Resp 18 | Ht 67.0 in | Wt 126.4 lb

## 2021-01-20 DIAGNOSIS — D6481 Anemia due to antineoplastic chemotherapy: Secondary | ICD-10-CM | POA: Diagnosis not present

## 2021-01-20 DIAGNOSIS — G620 Drug-induced polyneuropathy: Secondary | ICD-10-CM

## 2021-01-20 DIAGNOSIS — C259 Malignant neoplasm of pancreas, unspecified: Secondary | ICD-10-CM | POA: Diagnosis not present

## 2021-01-20 DIAGNOSIS — D696 Thrombocytopenia, unspecified: Secondary | ICD-10-CM | POA: Diagnosis not present

## 2021-01-20 DIAGNOSIS — T451X5A Adverse effect of antineoplastic and immunosuppressive drugs, initial encounter: Secondary | ICD-10-CM

## 2021-01-20 DIAGNOSIS — Z5111 Encounter for antineoplastic chemotherapy: Secondary | ICD-10-CM

## 2021-01-20 LAB — CBC WITH DIFFERENTIAL/PLATELET
Abs Immature Granulocytes: 0.01 10*3/uL (ref 0.00–0.07)
Basophils Absolute: 0 10*3/uL (ref 0.0–0.1)
Basophils Relative: 1 %
Eosinophils Absolute: 0.1 10*3/uL (ref 0.0–0.5)
Eosinophils Relative: 2 %
HCT: 35 % — ABNORMAL LOW (ref 36.0–46.0)
Hemoglobin: 11.9 g/dL — ABNORMAL LOW (ref 12.0–15.0)
Immature Granulocytes: 0 %
Lymphocytes Relative: 34 %
Lymphs Abs: 1.3 10*3/uL (ref 0.7–4.0)
MCH: 34.4 pg — ABNORMAL HIGH (ref 26.0–34.0)
MCHC: 34 g/dL (ref 30.0–36.0)
MCV: 101.2 fL — ABNORMAL HIGH (ref 80.0–100.0)
Monocytes Absolute: 0.4 10*3/uL (ref 0.1–1.0)
Monocytes Relative: 11 %
Neutro Abs: 2.1 10*3/uL (ref 1.7–7.7)
Neutrophils Relative %: 52 %
Platelets: 145 10*3/uL — ABNORMAL LOW (ref 150–400)
RBC: 3.46 MIL/uL — ABNORMAL LOW (ref 3.87–5.11)
RDW: 12.8 % (ref 11.5–15.5)
WBC: 3.9 10*3/uL — ABNORMAL LOW (ref 4.0–10.5)
nRBC: 0 % (ref 0.0–0.2)

## 2021-01-20 LAB — COMPREHENSIVE METABOLIC PANEL
ALT: 37 U/L (ref 0–44)
AST: 34 U/L (ref 15–41)
Albumin: 4.2 g/dL (ref 3.5–5.0)
Alkaline Phosphatase: 91 U/L (ref 38–126)
Anion gap: 7 (ref 5–15)
BUN: 21 mg/dL (ref 8–23)
CO2: 25 mmol/L (ref 22–32)
Calcium: 8.8 mg/dL — ABNORMAL LOW (ref 8.9–10.3)
Chloride: 106 mmol/L (ref 98–111)
Creatinine, Ser: 0.65 mg/dL (ref 0.44–1.00)
GFR, Estimated: >60 mL/min (ref >60)
Glucose, Bld: 110 mg/dL — ABNORMAL HIGH (ref 70–99)
Potassium: 4.1 mmol/L (ref 3.5–5.1)
Sodium: 138 mmol/L (ref 135–145)
Total Bilirubin: 0.4 mg/dL (ref 0.3–1.2)
Total Protein: 6.3 g/dL — ABNORMAL LOW (ref 6.5–8.1)

## 2021-01-20 MED ORDER — SODIUM CHLORIDE 0.9% FLUSH
10.0000 mL | INTRAVENOUS | Status: DC | PRN
  Administered 2021-01-20: 10 mL via INTRAVENOUS
  Filled 2021-01-20: qty 10

## 2021-01-20 MED ORDER — SODIUM CHLORIDE 0.9 % IV SOLN
150.0000 mg | Freq: Once | INTRAVENOUS | Status: AC
  Administered 2021-01-20: 150 mg via INTRAVENOUS
  Filled 2021-01-20: qty 150

## 2021-01-20 MED ORDER — SODIUM CHLORIDE 0.9 % IV SOLN
2400.0000 mg/m2 | INTRAVENOUS | Status: DC
  Administered 2021-01-20: 4000 mg via INTRAVENOUS
  Filled 2021-01-20: qty 80

## 2021-01-20 MED ORDER — PALONOSETRON HCL INJECTION 0.25 MG/5ML
0.2500 mg | Freq: Once | INTRAVENOUS | Status: AC
  Administered 2021-01-20: 0.25 mg via INTRAVENOUS
  Filled 2021-01-20: qty 5

## 2021-01-20 MED ORDER — SODIUM CHLORIDE 0.9 % IV SOLN
150.0000 mg/m2 | Freq: Once | INTRAVENOUS | Status: AC
  Administered 2021-01-20: 260 mg via INTRAVENOUS
  Filled 2021-01-20: qty 10

## 2021-01-20 MED ORDER — CREON 24000-76000 UNITS PO CPEP: 1.0000 | ORAL_CAPSULE | Freq: Three times a day (TID) | ORAL | 3 refills | Status: DC

## 2021-01-20 MED ORDER — SODIUM CHLORIDE 0.9 % IV SOLN
10.0000 mg | Freq: Once | INTRAVENOUS | Status: AC
  Administered 2021-01-20: 10 mg via INTRAVENOUS
  Filled 2021-01-20: qty 10

## 2021-01-20 MED ORDER — DIPHENHYDRAMINE HCL 25 MG PO CAPS
25.0000 mg | ORAL_CAPSULE | Freq: Once | ORAL | Status: AC
  Administered 2021-01-20: 25 mg via ORAL
  Filled 2021-01-20: qty 1

## 2021-01-20 MED ORDER — SODIUM CHLORIDE 0.9 % IV SOLN
650.0000 mg | Freq: Once | INTRAVENOUS | Status: AC
  Administered 2021-01-20: 650 mg via INTRAVENOUS
  Filled 2021-01-20: qty 25

## 2021-01-20 MED ORDER — ATROPINE SULFATE 1 MG/ML IJ SOLN
0.5000 mg | Freq: Once | INTRAMUSCULAR | Status: AC
  Administered 2021-01-20: 0.5 mg via INTRAVENOUS
  Filled 2021-01-20: qty 1

## 2021-01-20 MED ORDER — SODIUM CHLORIDE 0.9 % IV SOLN
INTRAVENOUS | Status: DC
  Filled 2021-01-20: qty 250

## 2021-01-20 NOTE — Progress Notes (Signed)
Hematology/Oncology follow up note [facility] Telephone:([phone] Fax:(336) [phone]   Patient Care Team: [name], MD as PCP - General (Family Medicine) Flinchum, Kelby Aline, FNP (Family Medicine) [name], RN as Oncology Nurse Navigator [name], MD as Consulting Physician (Oncology)  REFERRING PROVIDER: [name], MD  CHIEF COMPLAINTS/REASON FOR VISIT:  Follow up for treatment of pancreatic adenocarcinoma  HISTORY OF PRESENTING ILLNESS:   Cassandra [name] is a  65 y.o.  female with PMH listed below was seen in consultation at the request of  [name], MD  for evaluation of pancreatic adenocarcinoma Patient initially presented with jaundice, transaminitis, bilirubin was 9.9.  CA 19-9 was 1874.  Patient also reports unintentional weight loss. 02/08/2020 MRI abdomen and MRCP with and without contrast was done at [facility] which showed pancreatic head mass measuring up to 3 cm, with marked associated narrowing of the portal vein confluence.  SMA is preserved.  Marked intrahepatic and extrahepatic biliary duct dilatation as well as mild dilatation of the main pancreatic duct. Multiple hepatic masses highly concerning for metastatic disease.  Patient underwent EUS on 02/19/2020, which showed irregular mass identified in the pancreatic head, hypoechoic, measured 64mmx33mm, sonographic evidence concerning for invasion into the superior mesenteric artery.  There is no sign of significant abnormality in the main pancreatic duct.  Dilatation of common bile duct which measured up to 16 mm.  Region of celiac artery was visualized and showed no signs of significant abnormality.  No lymphadenopathy.  FNA showed adenocarcinoma.  02/19/2020, ERCP, malignant.  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  The stricture was treated with placement of wall flex metal stent.  Patient was  seen by [facility] oncology Dr. Pia Mau and was recommended for 3 drug regimen FOLFIRINOX.  Patient prefers to do chemotherapy locally at [facility].  Patient was referred to establish care today. She denies any pain.  Since stent placement, skin jaundice has improved.  Itchiness has also improved. Patient was accompanied by her husband today.  She has a family history of breast cancer in sister and paternal aunt, colon cancer paternal grandmother.  #no reportable targetable mutation on NGS 9/14/2021cycle 1 FOLFIRINOX.  Patient received oxaliplatin and about 50% of Irinotecan on day 1 and had experienced neurologic symptoms.  She went to ER and working diagnosis is TIA, and eventually I think this is due to irinotecan side effects -irinotecan-associated dysarthria, lip/tongue numbness.  Adjustment was made for Irinotecan to be  infused over 180 minutes.  Atropine 0.5 mg once prior to the irinotecan. No recurrent symptoms.   # 03/19/2020-03/21/2020 patient was admitted due to sepsis with strep pneumonia bacteremia.  Patient was treated with IV Rocephin.  TEE was done which showed no vegetation.  No PFO or ASD.  Patient was discharged home and he finished full course of 14 days of IV Rocephin on 04/02/2020 per ID recommendation.  Repeat blood culture was also negative.  08/25/20 cycle 10 FOLFIRINOX 09/08/20- present  Starting cycle 11, FOLFIRI, Oxaliplatin discontinued due to neuropathy   #NGS showed no reportable targetable mutation #Genetic testing-[employer] diagnostic testing showed no pathological variants identified. MS stable, TMB 67mut/mb, KRAS G12D, SF3B1 K700E, TP53 V244fs*30  INTERVAL HISTORY Cassandra [name] is a 65 y.o. female who has above history reviewed by me today presents for evaluation prior to chemotherapy. She feels well today.  She had CT done at [facility] during the interval, stable disease.  No new complaints. No nausea vomiting.  intermittent oily loose bowel movements have improved after started on  Creon.  Review of Systems  Constitutional:  Negative for appetite change, chills, fatigue, fever and unexpected weight change.  HENT:   Negative for hearing loss and voice change.   Eyes:  Negative for eye problems.  Respiratory:  Negative for chest tightness and cough.   Cardiovascular:  Negative for chest pain.  Gastrointestinal:  Negative for abdominal distention, abdominal pain and blood in stool.  Endocrine: Negative for hot flashes.  Genitourinary:  Negative for difficulty urinating and frequency.   Musculoskeletal:  Negative for arthralgias.  Skin:  Negative for itching and rash.  Neurological:  Positive for numbness. Negative for extremity weakness.  Hematological:  Negative for adenopathy.  Psychiatric/Behavioral:  Negative for confusion.    MEDICAL HISTORY:  Past Medical History:  Diagnosis Date   Cancer [facility])    pancreatic cancer   Colon polyps    Family history of breast cancer    Neutropenia ([place]) 04/18/2020   Osteopenia after menopause 05/2017   femoral neck T score -2.0   Personal history of chemotherapy    current for pancreatic ca    SURGICAL HISTORY: Past Surgical History:  Procedure Laterality Date   COLONOSCOPY  07/2015   WNL   COLONOSCOPY  2008/2011   OVARIAN CYST REMOVAL  1992   dermoid-Dr CAK   PORTA CATH INSERTION N/A 03/07/2020   Procedure: PORTA CATH INSERTION;  Surgeon: Algernon Huxley, MD;  Location: [facility];  Service: Cardiovascular;  Laterality: N/A;   TEE WITHOUT CARDIOVERSION N/A 03/21/2020   Procedure: TRANSESOPHAGEAL ECHOCARDIOGRAM (TEE);  Surgeon: Dionisio David, MD;  Location: [facility] ORS;  Service: Cardiovascular;  Laterality: N/A;   TUBAL LIGATION  1993    SOCIAL HISTORY: Social History   Socioeconomic History   Marital status: Married    Spouse name: Not on file   Number of children: 2   Years of education: Not on file   Highest education level: Not on file  Occupational History   Occupation: Pharmacist, hospital    Comment:  retired   Occupation: Psychologist, sport and exercise  Tobacco Use   Smoking status: Former    Packs/day: 0.50    Years: 10.00    Pack years: 5.00    Types: Cigarettes    Quit date: 1987    Years since quitting: 35.5   Smokeless tobacco: Never   Tobacco comments:    Quit smoking 1987  Vaping Use   Vaping Use: Never used  Substance and Sexual Activity   Alcohol use: Not Currently    Comment: 0-2 mixed drinks a day   Drug use: No   Sexual activity: Not Currently    Birth control/protection: Post-menopausal  Other Topics Concern   Not on file  Social History Narrative   Not on file   Social Determinants of Health   Financial Resource Strain: Not on file  Food Insecurity: Not on file  Transportation Needs: Not on file  Physical Activity: Not on file  Stress: Not on file  Social Connections: Not on file  Intimate Partner Violence: Not on file    FAMILY HISTORY: Family History  Problem Relation Age of Onset   Breast cancer Paternal Aunt 64   Diabetes Mother    Osteoporosis Mother    Hyperlipidemia Father    Rheumatic fever Father    Valvular heart disease Father    Cancer Paternal Grandmother        possible  colon   Breast cancer Sister 26    ALLERGIES:  is allergic to penicillin g.  MEDICATIONS:  Current Outpatient Medications  Medication Sig Dispense Refill   Calcium Carbonate-Vit D-Min (CALCIUM 1200 PO) Take by mouth.     Cholecalciferol 25 MCG (1000 UT) tablet Take 2,000 Units by mouth daily.     lidocaine-prilocaine (EMLA) cream Apply to affected area once (Patient taking differently: Apply 1 application topically as directed.) 30 g 3   Multiple Vitamins-Minerals (MULTIVITAMIN WITH MINERALS) tablet Take 1 tablet by mouth daily.     ondansetron (ZOFRAN) 8 MG tablet Take 1 tablet (8 mg total) by mouth 2 (two) times daily as needed. Start on day 3 after chemotherapy. 30 tablet 1   potassium chloride SA (KLOR-CON M20) 20 MEQ tablet Take 1 tablet (20 mEq total) by mouth daily. (Patient  taking differently: Take 20 mEq by mouth daily. QOD) 30 tablet 0   prochlorperazine (COMPAZINE) 10 MG tablet Take 1 tablet (10 mg total) by mouth every 6 (six) hours as needed (Nausea or vomiting). 30 tablet 1   simethicone (GAS-X) 80 MG chewable tablet Chew 1 tablet (80 mg total) by mouth every 8 (eight) hours as needed for flatulence. 60 tablet 0   loperamide (IMODIUM A-D) 2 MG tablet Take 2 at onset of diarrhea, then 1 every 2hrs until 12hr without a BM. May take 2 tab every 4hrs at bedtime. If diarrhea recurs repeat. (Patient not taking: No sig reported) 100 tablet 1   magic mouthwash w/lidocaine SOLN Take 5 mLs by mouth 4 (four) times daily as needed for mouth pain. Sig: Swish & Spit 5-10 ml four times a day as needed. Dispense 480 ml. 1RF (Patient not taking: No sig reported) 480 mL 1   Pancrelipase, Lip-Prot-Amyl, (CREON) 24000-76000 units CPEP Take 1 capsule (24,000 Units total) by mouth 3 (three) times daily before meals. 90 capsule 3   No current facility-administered medications for this visit.   Facility-Administered Medications Ordered in Other Visits  Medication Dose Route Frequency Provider Last Rate Last Admin   0.9 %  sodium chloride infusion   Intravenous Continuous [name], MD   Stopped at 01/20/21 1513   fluorouracil (ADRUCIL) 4,000 mg in sodium chloride 0.9 % 70 mL chemo infusion  2,400 mg/m2 (Order-Specific) Intravenous 1 day or 1 dose [name], MD   4,000 mg at 01/20/21 1513   sodium chloride flush (NS) 0.9 % injection 10 mL  10 mL Intravenous PRN [name], MD   10 mL at 01/20/21 0918     PHYSICAL EXAMINATION: ECOG PERFORMANCE STATUS: 1 - Symptomatic but completely ambulatory Vitals:   01/20/21 0944  BP: 131/86  Pulse: (!) 59  Resp: 18  Temp: 98.8 F (37.1 C)   Filed Weights   01/20/21 0944  Weight: 126 lb 6.4 oz (57.3 kg)    Physical Exam Constitutional:      General: She is not in acute distress. HENT:     Head: Normocephalic and atraumatic.  Eyes:      General: No scleral icterus. Cardiovascular:     Rate and Rhythm: Normal rate and regular rhythm.     Heart sounds: Normal heart sounds.  Pulmonary:     Effort: Pulmonary effort is normal. No respiratory distress.     Breath sounds: No wheezing.  Abdominal:     General: Bowel sounds are normal. There is no distension.     Palpations: Abdomen is soft.  Musculoskeletal:  General: No deformity. Normal range of motion.     Cervical back: Normal range of motion and neck supple.  Skin:    General: Skin is warm and dry.     Coloration: Skin is not jaundiced.     Findings: No erythema or rash.  Neurological:     Mental Status: She is alert and oriented to person, place, and time. Mental status is at baseline.     Cranial Nerves: No cranial nerve deficit.     Coordination: Coordination normal.  Psychiatric:        Mood and Affect: Mood normal.    LABORATORY DATA:  I have reviewed the data as listed Lab Results  Component Value Date   WBC 3.9 (L) 01/20/2021   HGB 11.9 (L) 01/20/2021   HCT 35.0 (L) 01/20/2021   MCV 101.2 (H) 01/20/2021   PLT 145 (L) 01/20/2021   Recent Labs    02/06/20 1049 02/29/20 1314 03/19/20 1024 03/20/20 0411 03/21/20 0318 04/04/20 0827 12/17/20 0816 01/06/21 0813 01/20/21 0912  NA 137   < > 129* 136 140   < > 141 139 138  K 4.1   < > 3.8 4.0 3.7   < > 4.2 4.1 4.1  CL 99   < > 95* 104 108   < > 107 106 106  CO2 23   < > $R'25 25 25   'Vd$ < > $R'27 25 25  '[place]$ GLUCOSE 563*   < > 145* 126* 114*   < > 106* 109* 110*  BUN 11   < > 11 7* 6*   < > $R'14 13 21  'Bz$ CREATININE 0.80   < > 0.85 0.66 0.52   < > 0.71 0.72 0.65  CALCIUM 10.2   < > 8.9 8.3* 8.2*   < > 8.8* 9.0 8.8*  GFRNONAA 78   < > >60 >60 >60   < > >60 >60 >60  GFRAA 90   < > >60 >60 >60  --   --   --   --   PROT 7.0   < > 7.1  --  5.3*   < > 6.3* 6.7 6.3*  ALBUMIN 4.9*   < > 3.5  --  2.5*   < > 3.9 4.1 4.2  AST 195*   < > 30  --  52*   < > 45* 42* 34  ALT 366*   < > 43  --  74*   < > 47* 45* 37  ALKPHOS  934*   < > 125  --  112   < > 104 99 91  BILITOT 9.9*   < > 1.5*  --  1.0   < > 0.7 0.5 0.4  BILIDIR 8.11*  --   --   --   --   --   --   --   --   IBILI 1.79*  --   --   --   --   --   --   --   --    < > = values in this interval not displayed.    Iron/TIBC/Ferritin/ %Sat No results found for: IRON, TIBC, FERRITIN, IRONPCTSAT    RADIOGRAPHIC STUDIES: I have personally reviewed the radiological images as listed and agreed with the findings in the report. Reviewed findings of MRI abdomen MRCP done at [facility]. CT CHEST W CONTRAST  Result Date: 10/31/2020 CLINICAL DATA:  Pancreatic cancer EXAM: CT CHEST WITH CONTRAST CT ABDOMEN WITH AND WITHOUT CONTRAST  TECHNIQUE: Multidetector CT imaging of the chest was performed during intravenous contrast administration. Multidetector CT imaging of the abdomen was performed following the standard protocol before and during bolus administration of intravenous contrast. CONTRAST:  124mL OMNIPAQUE IOHEXOL 300 MG/ML  SOLN COMPARISON:  MR abdomen, 06/11/2020, CT chest, 03/13/2020 FINDINGS: CT CHEST FINDINGS Cardiovascular: Right chest port catheter. Scattered aortic atherosclerosis. 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 Normal heart size. No pericardial effusion. Mediastinum/Nodes: No enlarged mediastinal, hilar, or axillary lymph nodes. Thyroid gland, trachea, and esophagus demonstrate no significant findings. Lungs/Pleura: Lungs are clear. No pleural effusion or pneumothorax. Musculoskeletal: No chest wall mass or suspicious bone lesions identified. CT ABDOMEN FINDINGS Hepatobiliary: No solid liver abnormality is seen. No gallstones, gallbladder wall thickening, or biliary dilatation. Redemonstrated common bile duct stent with post stenting pneumobilia. Pancreas: Unchanged soft tissue fullness of the pancreatic head without discrete mass (series 14, image 116). There is mild atrophy of the distal pancreatic  parenchyma. No pancreatic ductal dilatation or surrounding inflammatory changes. Spleen: Normal in size without significant abnormality. Adrenals/Urinary Tract: Adrenal glands are unremarkable. Kidneys are normal, without renal calculi, solid lesion, or hydronephrosis. Stomach/Bowel: Stomach is within normal limits. Appendix appears normal. No evidence of bowel wall thickening, distention, or inflammatory changes. Vascular/Lymphatic: Aortic atherosclerosis. No enlarged abdominal lymph nodes. Other: No abdominal wall hernia or abnormality. No abdominopelvic ascites. Musculoskeletal: No acute or significant osseous findings. IMPRESSION: 1. Unchanged soft tissue fullness of the pancreatic head without discrete mass. There is mild atrophy of the distal pancreatic parenchyma. No pancreatic ductal dilatation or surrounding inflammatory changes. 2. Redemonstrated common bile duct stent with post stenting pneumobilia. 3. No evidence of lymphadenopathy or metastatic disease in the chest or abdomen. 4. 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 Recommend annual imaging followup by CTA or MRA. This recommendation follows 2010 ACCF/AHA/AATS/ACR/ASA/SCA/SCAI/SIR/STS/SVM Guidelines for the Diagnosis and Management of Patients with Thoracic Aortic Disease. Circulation. 2010; 121: K553-Z482. Aortic aneurysm NOS (ICD10-I71.9) Aortic Atherosclerosis (ICD10-I70.0). Electronically Signed   By: Eddie Candle M.D.   On: 10/31/2020 11:59   MM 3D SCREEN BREAST BILATERAL  Result Date: 01/02/2021 CLINICAL DATA:  Screening. EXAM: DIGITAL SCREENING BILATERAL MAMMOGRAM WITH TOMOSYNTHESIS AND CAD TECHNIQUE: Bilateral screening digital craniocaudal and mediolateral oblique mammograms were obtained. Bilateral screening digital breast tomosynthesis was performed. The images were evaluated with computer-aided detection. COMPARISON:  Previous exam(s). ACR Breast Density Category c: The breast tissue  is heterogeneously dense, which may obscure small masses. FINDINGS: There are no findings suspicious for malignancy. IMPRESSION: No mammographic evidence of malignancy. A result letter of this screening mammogram will be mailed directly to the patient. RECOMMENDATION: Screening mammogram in one year (as long as the patient has a life expectancy of 10+ years with her diagnosis of pancreatic carcinoma). (Code:SM-B-01Y) BI-RADS CATEGORY  1: Negative. Electronically Signed   By: Evangeline Dakin M.D.   On: 01/02/2021 13:53  CT PANCREAS ABD W/WO  Result Date: 10/31/2020 CLINICAL DATA:  Pancreatic cancer EXAM: CT CHEST WITH CONTRAST CT ABDOMEN WITH AND WITHOUT CONTRAST TECHNIQUE: Multidetector CT imaging of the chest was performed during intravenous contrast administration. Multidetector CT imaging of the abdomen was performed following the standard protocol before and during bolus administration of intravenous contrast. CONTRAST:  159mL OMNIPAQUE IOHEXOL 300 MG/ML  SOLN COMPARISON:  MR abdomen, 06/11/2020, CT chest, 03/13/2020 FINDINGS: CT CHEST FINDINGS Cardiovascular: Right chest port catheter. Scattered aortic atherosclerosis. Unchanged enlargement of the  tubular ascending thoracic aorta, measuring up to 4.2 x 4.2 cm. Normal heart size. No pericardial effusion. Mediastinum/Nodes: No enlarged mediastinal, hilar, or axillary lymph nodes. Thyroid gland, trachea, and esophagus demonstrate no significant findings. Lungs/Pleura: Lungs are clear. No pleural effusion or pneumothorax. Musculoskeletal: No chest wall mass or suspicious bone lesions identified. CT ABDOMEN FINDINGS Hepatobiliary: No solid liver abnormality is seen. No gallstones, gallbladder wall thickening, or biliary dilatation. Redemonstrated common bile duct stent with post stenting pneumobilia. Pancreas: Unchanged soft tissue fullness of the pancreatic head without discrete mass (series 14, image 116). There is mild atrophy of the distal pancreatic  parenchyma. No pancreatic ductal dilatation or surrounding inflammatory changes. Spleen: Normal in size without significant abnormality. Adrenals/Urinary Tract: Adrenal glands are unremarkable. Kidneys are normal, without renal calculi, solid lesion, or hydronephrosis. Stomach/Bowel: Stomach is within normal limits. Appendix appears normal. No evidence of bowel wall thickening, distention, or inflammatory changes. Vascular/Lymphatic: Aortic atherosclerosis. No enlarged abdominal lymph nodes. Other: No abdominal wall hernia or abnormality. No abdominopelvic ascites. Musculoskeletal: No acute or significant osseous findings. IMPRESSION: 1. Unchanged soft tissue fullness of the pancreatic head without discrete mass. There is mild atrophy of the distal pancreatic parenchyma. No pancreatic ductal dilatation or surrounding inflammatory changes. 2. Redemonstrated common bile duct stent with post stenting pneumobilia. 3. No evidence of lymphadenopathy or metastatic disease in the chest or abdomen. 4. 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 Recommend annual imaging followup by CTA or MRA. This recommendation follows 2010 ACCF/AHA/AATS/ACR/ASA/SCA/SCAI/SIR/STS/SVM Guidelines for the Diagnosis and Management of Patients with Thoracic Aortic Disease. Circulation. 2010; 121: J335-K562. Aortic aneurysm NOS (ICD10-I71.9) Aortic Atherosclerosis (ICD10-I70.0). Electronically Signed   By: Eddie Candle M.D.   On: 10/31/2020 11:59      ASSESSMENT & PLAN:  1. Primary pancreatic cancer ([place])   2. Anemia due to antineoplastic chemotherapy   3. Chemotherapy-induced neuropathy ([place])   4. Encounter for antineoplastic chemotherapy   5. 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 Proceed with maintenance FOLFIRI today. CT abdomen with pancreaticobiliary protocol nonambulatory.  Stable disease. Pending evaluation by [facility] surgery for possible Whipple surgery.   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  Continue Creon,will start with 24000 units TID with meals.   #Elevated LFT, mild and chronic for her. 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   Patient follows up with neurology for management..  Continue acupuncture.  #Chemotherapy-induced anemia.  Hemoglobin is stable at 11.9 monitor closely.  Supportive care measures are necessary for patient well-being and will be provided as necessary. We spent sufficient time to discuss many aspect of care, questions were answered to patient's satisfaction.   All questions were answered. The patient knows to call the clinic with any problems questions or concerns.  Return of visit: 2 weeks  [name], MD, PhD Hematology Oncology [facility] Pager- [phone] 01/20/2021

## 2021-01-20 NOTE — Patient Instructions (Signed)
[facility] ONCOLOGY  Discharge Instructions: Thank you for choosing [facility] to provide your oncology and hematology care.  If you have a lab appointment with the [facility], please go directly to the [place] and check in at the registration area.  Wear comfortable clothing and clothing appropriate for easy access to any Portacath or PICC line.   We strive to give you quality time with your provider. You may need to reschedule your appointment if you arrive late (15 or more minutes).  Arriving late affects you and other patients whose appointments are after yours.  Also, if you miss three or more appointments without notifying the office, you may be dismissed from the clinic at the provider's discretion.      For prescription refill requests, have your pharmacy contact our office and allow 72 hours for refills to be completed.    Today you received the following chemotherapy and/or immunotherapy agents: Irinotecan, Leucovorin, 5FU      To help prevent nausea and vomiting after your treatment, we encourage you to take your nausea medication as directed.  BELOW ARE SYMPTOMS THAT SHOULD BE REPORTED IMMEDIATELY: *FEVER GREATER THAN 100.4 F (38 C) OR HIGHER *CHILLS OR SWEATING *NAUSEA AND VOMITING THAT IS NOT CONTROLLED WITH YOUR NAUSEA MEDICATION *UNUSUAL SHORTNESS OF BREATH *UNUSUAL BRUISING OR BLEEDING *URINARY PROBLEMS (pain or burning when urinating, or frequent urination) *BOWEL PROBLEMS (unusual diarrhea, constipation, pain near the anus) TENDERNESS IN MOUTH AND THROAT WITH OR WITHOUT PRESENCE OF ULCERS (sore throat, sores in mouth, or a toothache) UNUSUAL RASH, SWELLING OR PAIN  UNUSUAL VAGINAL DISCHARGE OR ITCHING   Items with * indicate a potential emergency and should be followed up as soon as possible or go to the Emergency Department if any problems should occur.  Please show the CHEMOTHERAPY ALERT CARD or IMMUNOTHERAPY ALERT  CARD at check-in to the Emergency Department and triage nurse.  Should you have questions after your visit or need to cancel or reschedule your appointment, please contact [facility]  [phone] and follow the prompts.  Office hours are 8:00 a.m. to 4:30 p.m. Monday - Friday. Please note that voicemails left after 4:00 p.m. may not be returned until the following business day.  We are closed weekends and major holidays. You have access to a nurse at all times for urgent questions. Please call the main number to the clinic [phone] and follow the prompts.  For any non-urgent questions, you may also contact your provider using MyChart. We now offer e-Visits for anyone 65 and older to request care online for non-urgent symptoms. For details visit [email or web address].   Also download the MyChart app! Go to the app store, search "MyChart", open the app, select [place], and log in with your MyChart username and password.  Due to Covid, a mask is required upon entering the hospital/clinic. If you do not have a mask, one will be given to you upon arrival. For doctor visits, patients may have 1 support person aged 65 or older with them. For treatment visits, patients cannot have anyone with them due to current Covid guidelines and our immunocompromised population.

## 2021-01-20 NOTE — Progress Notes (Signed)
Nutrition Follow-up:  Patient with metastatic pancreatic cancer.  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    Spoke with patient during infusion.  Patient reports that her appetite is good and feels that pancreatic enzymes are helping.  Reports less gas, bloating, oily stools.  Taking 24,000 lipase units with meals.  Drinking boost shake during visit.    Reports that [facility] is considering surgery.      Medications: creon  Labs: glucose 110  Anthropometrics:   Weight 126 lb 6.4 oz today  123 lb on 6/8 124 lb on 5/25 122 lb on 4/13 130 lb on 12/27  NUTRITION DIAGNOSIS: Inadequate oral intake improved   INTERVENTION:  Encouraged patient to continue taking creon. Should be taken with first bite of food.   Continue high calorie, high protein foods    MONITORING, EVALUATION, GOAL: weight trends, intake   NEXT VISIT: ~ 4-6 weeks during infusion  Tayona Sarnowski B. Zenia Resides, [place], [place] Registered Dietitian [phone] (mobile)

## 2021-01-20 NOTE — Progress Notes (Signed)
Patient here for follow up. No new concerns voiced.  °

## 2021-01-22 ENCOUNTER — Inpatient Hospital Stay: Payer: Medicare Other

## 2021-01-22 DIAGNOSIS — Z5111 Encounter for antineoplastic chemotherapy: Secondary | ICD-10-CM | POA: Diagnosis not present

## 2021-01-22 DIAGNOSIS — C259 Malignant neoplasm of pancreas, unspecified: Secondary | ICD-10-CM

## 2021-01-22 MED ORDER — SODIUM CHLORIDE 0.9% FLUSH
10.0000 mL | INTRAVENOUS | Status: DC | PRN
  Administered 2021-01-22: 10 mL
  Filled 2021-01-22: qty 10

## 2021-01-22 MED ORDER — HEPARIN SOD (PORK) LOCK FLUSH 100 UNIT/ML IV SOLN
500.0000 [IU] | Freq: Once | INTRAVENOUS | Status: AC | PRN
  Administered 2021-01-22: 500 [IU]
  Filled 2021-01-22: qty 5

## 2021-01-22 MED ORDER — HEPARIN SOD (PORK) LOCK FLUSH 100 UNIT/ML IV SOLN
INTRAVENOUS | Status: AC
  Filled 2021-01-22: qty 5

## 2021-02-04 ENCOUNTER — Encounter: Payer: Self-pay | Admitting: Oncology

## 2021-02-04 ENCOUNTER — Other Ambulatory Visit: Payer: Self-pay

## 2021-02-04 ENCOUNTER — Inpatient Hospital Stay (HOSPITAL_BASED_OUTPATIENT_CLINIC_OR_DEPARTMENT_OTHER): Payer: Medicare Other | Admitting: Oncology

## 2021-02-04 ENCOUNTER — Inpatient Hospital Stay: Payer: Medicare Other | Attending: Oncology

## 2021-02-04 ENCOUNTER — Inpatient Hospital Stay: Payer: Medicare Other

## 2021-02-04 VITALS — BP 121/80 | HR 63 | Temp 98.4°F | Resp 20 | Wt 127.1 lb

## 2021-02-04 DIAGNOSIS — G620 Drug-induced polyneuropathy: Secondary | ICD-10-CM | POA: Diagnosis not present

## 2021-02-04 DIAGNOSIS — Z5111 Encounter for antineoplastic chemotherapy: Secondary | ICD-10-CM | POA: Diagnosis not present

## 2021-02-04 DIAGNOSIS — D6481 Anemia due to antineoplastic chemotherapy: Secondary | ICD-10-CM | POA: Diagnosis not present

## 2021-02-04 DIAGNOSIS — C787 Secondary malignant neoplasm of liver and intrahepatic bile duct: Secondary | ICD-10-CM | POA: Diagnosis present

## 2021-02-04 DIAGNOSIS — T451X5A Adverse effect of antineoplastic and immunosuppressive drugs, initial encounter: Secondary | ICD-10-CM

## 2021-02-04 DIAGNOSIS — C259 Malignant neoplasm of pancreas, unspecified: Secondary | ICD-10-CM

## 2021-02-04 DIAGNOSIS — Z452 Encounter for adjustment and management of vascular access device: Secondary | ICD-10-CM | POA: Diagnosis not present

## 2021-02-04 DIAGNOSIS — C250 Malignant neoplasm of head of pancreas: Secondary | ICD-10-CM | POA: Insufficient documentation

## 2021-02-04 DIAGNOSIS — Z298 Encounter for other specified prophylactic measures: Secondary | ICD-10-CM

## 2021-02-04 DIAGNOSIS — D696 Thrombocytopenia, unspecified: Secondary | ICD-10-CM | POA: Insufficient documentation

## 2021-02-04 DIAGNOSIS — K8689 Other specified diseases of pancreas: Secondary | ICD-10-CM

## 2021-02-04 DIAGNOSIS — Z1329 Encounter for screening for other suspected endocrine disorder: Secondary | ICD-10-CM

## 2021-02-04 LAB — CBC WITH DIFFERENTIAL/PLATELET
Abs Immature Granulocytes: 0.01 10*3/uL (ref 0.00–0.07)
Basophils Absolute: 0 10*3/uL (ref 0.0–0.1)
Basophils Relative: 1 %
Eosinophils Absolute: 0.1 10*3/uL (ref 0.0–0.5)
Eosinophils Relative: 3 %
HCT: 35.5 % — ABNORMAL LOW (ref 36.0–46.0)
Hemoglobin: 12.1 g/dL (ref 12.0–15.0)
Immature Granulocytes: 0 %
Lymphocytes Relative: 34 %
Lymphs Abs: 1.2 10*3/uL (ref 0.7–4.0)
MCH: 34.3 pg — ABNORMAL HIGH (ref 26.0–34.0)
MCHC: 34.1 g/dL (ref 30.0–36.0)
MCV: 100.6 fL — ABNORMAL HIGH (ref 80.0–100.0)
Monocytes Absolute: 0.5 10*3/uL (ref 0.1–1.0)
Monocytes Relative: 14 %
Neutro Abs: 1.8 10*3/uL (ref 1.7–7.7)
Neutrophils Relative %: 48 %
Platelets: 150 10*3/uL (ref 150–400)
RBC: 3.53 MIL/uL — ABNORMAL LOW (ref 3.87–5.11)
RDW: 12.6 % (ref 11.5–15.5)
WBC: 3.7 10*3/uL — ABNORMAL LOW (ref 4.0–10.5)
nRBC: 0 % (ref 0.0–0.2)

## 2021-02-04 LAB — COMPREHENSIVE METABOLIC PANEL
ALT: 29 U/L (ref 0–44)
AST: 27 U/L (ref 15–41)
Albumin: 4.1 g/dL (ref 3.5–5.0)
Alkaline Phosphatase: 89 U/L (ref 38–126)
Anion gap: 6 (ref 5–15)
BUN: 20 mg/dL (ref 8–23)
CO2: 26 mmol/L (ref 22–32)
Calcium: 8.6 mg/dL — ABNORMAL LOW (ref 8.9–10.3)
Chloride: 105 mmol/L (ref 98–111)
Creatinine, Ser: 0.66 mg/dL (ref 0.44–1.00)
GFR, Estimated: >60 mL/min (ref >60)
Glucose, Bld: 100 mg/dL — ABNORMAL HIGH (ref 70–99)
Potassium: 4.1 mmol/L (ref 3.5–5.1)
Sodium: 137 mmol/L (ref 135–145)
Total Bilirubin: 0.4 mg/dL (ref 0.3–1.2)
Total Protein: 6.3 g/dL — ABNORMAL LOW (ref 6.5–8.1)

## 2021-02-04 MED ORDER — ATROPINE SULFATE 1 MG/ML IJ SOLN
0.5000 mg | Freq: Once | INTRAMUSCULAR | Status: AC
  Administered 2021-02-04: 0.5 mg via INTRAVENOUS
  Filled 2021-02-04: qty 1

## 2021-02-04 MED ORDER — SODIUM CHLORIDE 0.9% FLUSH
10.0000 mL | Freq: Once | INTRAVENOUS | Status: AC
  Administered 2021-02-04: 10 mL via INTRAVENOUS
  Filled 2021-02-04: qty 10

## 2021-02-04 MED ORDER — DIPHENHYDRAMINE HCL 25 MG PO CAPS
25.0000 mg | ORAL_CAPSULE | Freq: Once | ORAL | Status: AC
  Administered 2021-02-04: 25 mg via ORAL
  Filled 2021-02-04: qty 1

## 2021-02-04 MED ORDER — SODIUM CHLORIDE 0.9 % IV SOLN
150.0000 mg/m2 | Freq: Once | INTRAVENOUS | Status: AC
  Administered 2021-02-04: 260 mg via INTRAVENOUS
  Filled 2021-02-04: qty 10

## 2021-02-04 MED ORDER — SODIUM CHLORIDE 0.9 % IV SOLN
150.0000 mg | Freq: Once | INTRAVENOUS | Status: AC
  Administered 2021-02-04: 150 mg via INTRAVENOUS
  Filled 2021-02-04: qty 150

## 2021-02-04 MED ORDER — SODIUM CHLORIDE 0.9 % IV SOLN
10.0000 mg | Freq: Once | INTRAVENOUS | Status: AC
  Administered 2021-02-04: 10 mg via INTRAVENOUS
  Filled 2021-02-04: qty 10

## 2021-02-04 MED ORDER — FLUOROURACIL CHEMO INJECTION 5 GM/100ML
2400.0000 mg/m2 | INTRAVENOUS | Status: DC
  Administered 2021-02-04: 4000 mg via INTRAVENOUS
  Filled 2021-02-04: qty 80

## 2021-02-04 MED ORDER — SODIUM CHLORIDE 0.9 % IV SOLN
INTRAVENOUS | Status: DC
  Filled 2021-02-04: qty 250

## 2021-02-04 MED ORDER — PALONOSETRON HCL INJECTION 0.25 MG/5ML
0.2500 mg | Freq: Once | INTRAVENOUS | Status: AC
  Administered 2021-02-04: 0.25 mg via INTRAVENOUS
  Filled 2021-02-04: qty 5

## 2021-02-04 MED ORDER — SODIUM CHLORIDE 0.9 % IV SOLN
650.0000 mg | Freq: Once | INTRAVENOUS | Status: AC
  Administered 2021-02-04: 650 mg via INTRAVENOUS
  Filled 2021-02-04: qty 5

## 2021-02-04 NOTE — Progress Notes (Signed)
Hematology/Oncology follow up note [facility] [phone] [phone]   Patient Care Team: [name], MD as PCP - General (Family Medicine) Flinchum, Kelby Aline, FNP (Family Medicine) [name], RN as Oncology Nurse Navigator [name], MD as Consulting Physician (Oncology)  REFERRING PROVIDER: [name], MD  CHIEF COMPLAINTS/REASON FOR VISIT:  Follow up for treatment of pancreatic adenocarcinoma  HISTORY OF PRESENTING ILLNESS:   Cassandra [name] is a  65 y.o.  female with PMH listed below was seen in consultation at the request of  [name], MD  for evaluation of pancreatic adenocarcinoma Patient initially presented with jaundice, transaminitis, bilirubin was 9.9.  CA 19-9 was 1874.  Patient also reports unintentional weight loss. 02/08/2020 MRI abdomen and MRCP with and without contrast was done at [facility] which showed pancreatic head mass measuring up to 3 cm, with marked associated narrowing of the portal vein confluence.  SMA is preserved.  Marked intrahepatic and extrahepatic biliary duct dilatation as well as mild dilatation of the main pancreatic duct. Multiple hepatic masses highly concerning for metastatic disease.  Patient underwent EUS on 02/19/2020, which showed irregular mass identified in the pancreatic head, hypoechoic, measured 45mx33mm, sonographic evidence concerning for invasion into the superior mesenteric artery.  There is no sign of significant abnormality in the main pancreatic duct.  Dilatation of common bile duct which measured up to 16 mm.  Region of celiac artery was visualized and showed no signs of significant abnormality.  No lymphadenopathy.  FNA showed adenocarcinoma.  02/19/2020, ERCP, malignant.  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  The stricture was treated with placement of wall flex metal stent.  [facility] ZPia Mauand was recommended for 3 drug regimen FOLFIRINOX.  [facility]  Patient was referred to establish care today. She denies any pain.  Since stent placement, skin jaundice has improved.  Itchiness has also improved. Patient was accompanied by her husband today.  She has a family history of breast cancer in sister and paternal aunt, colon cancer paternal grandmother.  #no reportable targetable mutation on NGS 9/14/2021cycle 1 FOLFIRINOX.  Patient received oxaliplatin and about 50% of Irinotecan on day 1 and had experienced neurologic symptoms.  She went to ER and working diagnosis is TIA, and eventually I think this is due to irinotecan side effects -irinotecan-associated dysarthria, lip/tongue numbness.  Adjustment was made for Irinotecan to be  infused over 180 minutes.  Atropine 0.5 mg once prior to the irinotecan. No recurrent symptoms.   # 03/19/2020-03/21/2020 patient was admitted due to sepsis with strep pneumonia bacteremia.  Patient was treated with IV Rocephin.  TEE was done which showed no vegetation.  No PFO or ASD.  Patient was discharged home and he finished full course of 14 days of IV Rocephin on 04/02/2020 per ID recommendation.  Repeat blood culture was also negative.  08/25/20 cycle 10 FOLFIRINOX 09/08/20- present  Starting cycle 11, FOLFIRI, Oxaliplatin discontinued due to neuropathy   #NGS showed no reportable targetable mutation #Genetic testing-[employer] diagnostic testing showed no pathological variants identified. MS stable, TMB 059m/mb, KRAS G12D, SF3B1 K700E, TP53 [id number]  #01/14/2021  [facility] disease.  INTERVAL HISTORY Cassandra [name] a [age]o. female who has above history reviewed by me today presents for evaluation prior to chemotherapy. She feels well today.  She has gained weight. Take Creon and bloating has improved.   Review of Systems  Constitutional:   Negative for appetite change, chills, fatigue, fever and unexpected weight change.  HENT:   Negative for hearing loss and voice change.   Eyes:  Negative for eye problems.  Respiratory:  Negative for chest tightness and cough.   Cardiovascular:  Negative for chest pain.  Gastrointestinal:  Negative for abdominal distention, abdominal pain and blood in stool.  Endocrine: Negative for hot flashes.  Genitourinary:  Negative for difficulty urinating and frequency.   Musculoskeletal:  Negative for arthralgias.  Skin:  Negative for itching and rash.  Neurological:  Positive for numbness. Negative for extremity weakness.  Hematological:  Negative for adenopathy.  Psychiatric/Behavioral:  Negative for confusion.    MEDICAL HISTORY:  Past Medical History:  Diagnosis Date   Cancer [facility])    pancreatic cancer   Colon polyps    Family history of breast cancer    Neutropenia ([place]) 04/18/2020   Osteopenia after menopause 05/2017   femoral neck T score -2.0   Personal history of chemotherapy    current for pancreatic ca    SURGICAL HISTORY: Past Surgical History:  Procedure Laterality Date   COLONOSCOPY  07/2015   WNL   COLONOSCOPY  2008/2011   OVARIAN CYST REMOVAL  1992   dermoid-Dr CAK   PORTA CATH INSERTION N/A 03/07/2020   Procedure: PORTA CATH INSERTION;  Surgeon: Algernon Huxley, MD;  Location: [facility];  Service: Cardiovascular;  Laterality: N/A;   TEE WITHOUT CARDIOVERSION N/A 03/21/2020   Procedure: TRANSESOPHAGEAL ECHOCARDIOGRAM (TEE);  Surgeon: Dionisio David, MD;  Location: [facility] ORS;  Service: Cardiovascular;  Laterality: N/A;   TUBAL LIGATION  1993    SOCIAL HISTORY: Social History   Socioeconomic History   Marital status: Married    Spouse name: Not on file   Number of children: 2   Years of education: Not on file   Highest education level: Not on file  Occupational History   Occupation: Pharmacist, hospital    Comment: retired   Occupation: Psychologist, sport and exercise  Tobacco Use    Smoking status: Former    Packs/day: 0.50    Years: 10.00    Pack years: 5.00    Types: Cigarettes    Quit date: 1987    Years since quitting: 35.6   Smokeless tobacco: Never   Tobacco comments:    Quit smoking 1987  Vaping Use   Vaping Use: Never used  Substance and Sexual Activity   Alcohol use: Not Currently    Comment: 0-2 mixed drinks a day   Drug use: No   Sexual activity: Not Currently    Birth control/protection: Post-menopausal  Other Topics Concern   Not on file  Social History Narrative   Not on file   Social Determinants of Health   Financial Resource Strain: Not on file  Food Insecurity: Not on file  Transportation Needs: Not on file  Physical Activity: Not on file  Stress: Not on file  Social Connections: Not on file  Intimate Partner Violence: Not on file    FAMILY HISTORY: Family History  Problem Relation Age of Onset   Breast cancer Paternal Aunt 44   Diabetes Mother    Osteoporosis Mother    Hyperlipidemia Father    Rheumatic fever Father    Valvular heart disease Father    Cancer Paternal Grandmother        possible colon   Breast cancer Sister 41  ALLERGIES:  is allergic to penicillin g.  MEDICATIONS:  Current Outpatient Medications  Medication Sig Dispense Refill   Calcium Carbonate-Vit D-Min (CALCIUM 1200 PO) Take by mouth.     Cholecalciferol 25 MCG (1000 UT) tablet Take 2,000 Units by mouth daily.     lidocaine-prilocaine (EMLA) cream Apply to affected area once (Patient taking differently: Apply 1 application topically as directed.) 30 g 3   Multiple Vitamins-Minerals (MULTIVITAMIN WITH MINERALS) tablet Take 1 tablet by mouth daily.     ondansetron (ZOFRAN) 8 MG tablet Take 1 tablet (8 mg total) by mouth 2 (two) times daily as needed. Start on day 3 after chemotherapy. 30 tablet 1   Pancrelipase, Lip-Prot-Amyl, (CREON) 24000-76000 units CPEP Take 1 capsule (24,000 Units total) by mouth 3 (three) times daily before meals. 90 capsule  3   potassium chloride SA (KLOR-CON M20) 20 MEQ tablet Take 1 tablet (20 mEq total) by mouth daily. (Patient taking differently: Take 20 mEq by mouth daily. QOD) 30 tablet 0   prochlorperazine (COMPAZINE) 10 MG tablet Take 1 tablet (10 mg total) by mouth every 6 (six) hours as needed (Nausea or vomiting). 30 tablet 1   simethicone (GAS-X) 80 MG chewable tablet Chew 1 tablet (80 mg total) by mouth every 8 (eight) hours as needed for flatulence. 60 tablet 0   loperamide (IMODIUM A-D) 2 MG tablet Take 2 at onset of diarrhea, then 1 every 2hrs until 12hr without a BM. May take 2 tab every 4hrs at bedtime. If diarrhea recurs repeat. (Patient not taking: No sig reported) 100 tablet 1   magic mouthwash w/lidocaine SOLN Take 5 mLs by mouth 4 (four) times daily as needed for mouth pain. Sig: Swish & Spit 5-10 ml four times a day as needed. Dispense 480 ml. 1RF (Patient not taking: No sig reported) 480 mL 1   No current facility-administered medications for this visit.   Facility-Administered Medications Ordered in Other Visits  Medication Dose Route Frequency Provider Last Rate Last Admin   0.9 %  sodium chloride infusion   Intravenous Continuous [name], MD 20 mL/hr at 02/04/21 1014 New Bag at 02/04/21 1014   fluorouracil (ADRUCIL) 4,000 mg in sodium chloride 0.9 % 70 mL chemo infusion  2,400 mg/m2 (Order-Specific) Intravenous 1 day or 1 dose [name], MD       irinotecan (CAMPTOSAR) 260 mg in sodium chloride 0.9 % 500 mL chemo infusion  150 mg/m2 (Order-Specific) Intravenous Once [name], MD 171 mL/hr at 02/04/21 1128 260 mg at 02/04/21 1128   leucovorin 650 mg in sodium chloride 0.9 % 250 mL infusion  650 mg Intravenous Once [name], MD 94 mL/hr at 02/04/21 1127 650 mg at 02/04/21 1127     PHYSICAL EXAMINATION: ECOG PERFORMANCE STATUS: 1 - Symptomatic but completely ambulatory Vitals:   02/04/21 0918  BP: 121/80  Pulse: 63  Resp: 20  Temp: 98.4 F (36.9 C)  SpO2: 100%   Filed Weights    02/04/21 0918  Weight: 127 lb 1.6 oz (57.7 kg)    Physical Exam Constitutional:      General: She is not in acute distress. HENT:     Head: Normocephalic and atraumatic.  Eyes:     General: No scleral icterus. Cardiovascular:     Rate and Rhythm: Normal rate and regular rhythm.     Heart sounds: Normal heart sounds.  Pulmonary:     Effort: Pulmonary effort is normal. No respiratory distress.     Breath sounds: No  wheezing.  Abdominal:     General: Bowel sounds are normal. There is no distension.     Palpations: Abdomen is soft.  Musculoskeletal:        General: No deformity. Normal range of motion.     Cervical back: Normal range of motion and neck supple.  Skin:    General: Skin is warm and dry.     Coloration: Skin is not jaundiced.     Findings: No erythema or rash.  Neurological:     Mental Status: She is alert and oriented to person, place, and time. Mental status is at baseline.     Cranial Nerves: No cranial nerve deficit.     Coordination: Coordination normal.  Psychiatric:        Mood and Affect: Mood normal.    LABORATORY DATA:  I have reviewed the data as listed Lab Results  Component Value Date   WBC 3.7 (L) 02/04/2021   HGB 12.1 02/04/2021   HCT 35.5 (L) 02/04/2021   MCV 100.6 (H) 02/04/2021   PLT 150 02/04/2021   Recent Labs    02/06/20 1049 02/29/20 1314 03/19/20 1024 03/20/20 0411 03/21/20 0318 04/04/20 0827 01/06/21 0813 01/20/21 0912 02/04/21 0835  NA 137   < > 129* 136 140   < > 139 138 137  K 4.1   < > 3.8 4.0 3.7   < > 4.1 4.1 4.1  CL 99   < > 95* 104 108   < > 106 106 105  CO2 23   < > _0 < > _1 GLUCOSE 113*   < > 145* 126* 114*   < > 109* 110* 100*  BUN 11   < > 11 7* 6*   < > _2 CREATININE 0.80   < > 0.85 0.66 0.52   < > 0.72 0.65 0.66  CALCIUM 10.2   < > 8.9 8.3* 8.2*   < > 9.0 8.8* 8.6*  GFRNONAA 78   < > >60 >60 >60   < > >60 >60 >60  GFRAA 90   < > >60 >60 >60  --   --   --   --   PROT 7.0   < > 7.1   --  5.3*   < > 6.7 6.3* 6.3*  ALBUMIN 4.9*   < > 3.5  --  2.5*   < > 4.1 4.2 4.1  AST 195*   < > 30  --  52*   < > 42* 34 27  ALT 366*   < > 43  --  74*   < > 45* 37 29  ALKPHOS 934*   < > 125  --  112   < > 99 91 89  BILITOT 9.9*   < > 1.5*  --  1.0   < > 0.5 0.4 0.4  BILIDIR 8.11*  --   --   --   --   --   --   --   --   IBILI 1.79*  --   --   --   --   --   --   --   --    < > = values in this interval not displayed.    Iron/TIBC/Ferritin/ %Sat No results found for: IRON, TIBC, FERRITIN, IRONPCTSAT    RADIOGRAPHIC STUDIES: I have personally reviewed the radiological images as listed and agreed with the findings in the report. Reviewed findings  of MRI abdomen [facility]. MM 3D SCREEN BREAST BILATERAL  Result Date: 01/02/2021 CLINICAL DATA:  Screening. EXAM: DIGITAL SCREENING BILATERAL MAMMOGRAM WITH TOMOSYNTHESIS AND CAD TECHNIQUE: Bilateral screening digital craniocaudal and mediolateral oblique mammograms were obtained. Bilateral screening digital breast tomosynthesis was performed. The images were evaluated with computer-aided detection. COMPARISON:  Previous exam(s). ACR Breast Density Category c: The breast tissue is heterogeneously dense, which may obscure small masses. FINDINGS: There are no findings suspicious for malignancy. IMPRESSION: No mammographic evidence of malignancy. A result letter of this screening mammogram will be mailed directly to the patient. RECOMMENDATION: Screening mammogram in one year (as long as the patient has a life expectancy of 10+ years with her diagnosis of pancreatic carcinoma). (Code:SM-B-01Y) BI-RADS CATEGORY  1: Negative. Electronically Signed   By: Evangeline Dakin M.D.   On: 01/02/2021 13:53     ASSESSMENT & PLAN:  1. Primary pancreatic cancer ([place])   2. Encounter for antineoplastic chemotherapy   3. 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 Thrombocytopenia ([place])   6. Pancreatic  insufficiency   7. Immunotherapy   8. 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 Proceed with maintenance FOLFIRI today. CT abdomen with pancreaticobiliary protocol nonambulatory.  Stable disease. Pending evaluation by [facility] surgery for possible Whipple surgery.   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  Symptoms are better after the start of Creon,continue  24000 units TID with meals.   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   Patient follows up with neurology for management..  Continue acupuncture.  #Chemotherapy-induced anemia.  Hemoglobin is stable at 12.1 monitor closely.  Supportive care measures are necessary for patient well-being and will be provided as necessary. We spent sufficient time to discuss many aspect of care, questions were answered to patient's satisfaction.   All questions were answered. The patient knows to call the clinic with any problems questions or concerns.  Return of visit: 2 weeks  [name], MD, PhD Hematology Oncology [facility] Pager- [phone] 02/04/2021

## 2021-02-05 LAB — CANCER ANTIGEN 19-9: CA 19-9: 6 U/mL (ref 0–35)

## 2021-02-06 ENCOUNTER — Inpatient Hospital Stay: Payer: Medicare Other

## 2021-02-06 DIAGNOSIS — C259 Malignant neoplasm of pancreas, unspecified: Secondary | ICD-10-CM

## 2021-02-06 DIAGNOSIS — Z5111 Encounter for antineoplastic chemotherapy: Secondary | ICD-10-CM | POA: Diagnosis not present

## 2021-02-06 MED ORDER — HEPARIN SOD (PORK) LOCK FLUSH 100 UNIT/ML IV SOLN
500.0000 [IU] | Freq: Once | INTRAVENOUS | Status: AC | PRN
  Administered 2021-02-06: 500 [IU]
  Filled 2021-02-06: qty 5

## 2021-02-06 MED ORDER — SODIUM CHLORIDE 0.9% FLUSH
10.0000 mL | INTRAVENOUS | Status: DC | PRN
  Administered 2021-02-06: 10 mL
  Filled 2021-02-06: qty 10

## 2021-02-06 MED ORDER — HEPARIN SOD (PORK) LOCK FLUSH 100 UNIT/ML IV SOLN
INTRAVENOUS | Status: AC
  Filled 2021-02-06: qty 5

## 2021-02-06 NOTE — Progress Notes (Signed)
Patient pump d/c'd. Port deaccessed. No concerns voiced. Discharged, stable.

## 2021-02-18 ENCOUNTER — Inpatient Hospital Stay (HOSPITAL_BASED_OUTPATIENT_CLINIC_OR_DEPARTMENT_OTHER): Payer: Medicare Other | Admitting: Oncology

## 2021-02-18 ENCOUNTER — Inpatient Hospital Stay: Payer: Medicare Other

## 2021-02-18 ENCOUNTER — Encounter: Payer: Self-pay | Admitting: Oncology

## 2021-02-18 VITALS — BP 120/84 | HR 65 | Temp 99.1°F | Wt 129.0 lb

## 2021-02-18 DIAGNOSIS — C259 Malignant neoplasm of pancreas, unspecified: Secondary | ICD-10-CM

## 2021-02-18 DIAGNOSIS — T451X5A Adverse effect of antineoplastic and immunosuppressive drugs, initial encounter: Secondary | ICD-10-CM

## 2021-02-18 DIAGNOSIS — D696 Thrombocytopenia, unspecified: Secondary | ICD-10-CM

## 2021-02-18 DIAGNOSIS — G620 Drug-induced polyneuropathy: Secondary | ICD-10-CM

## 2021-02-18 DIAGNOSIS — Z5111 Encounter for antineoplastic chemotherapy: Secondary | ICD-10-CM | POA: Diagnosis not present

## 2021-02-18 DIAGNOSIS — K8689 Other specified diseases of pancreas: Secondary | ICD-10-CM

## 2021-02-18 LAB — CBC WITH DIFFERENTIAL/PLATELET
Abs Immature Granulocytes: 0.01 10*3/uL (ref 0.00–0.07)
Basophils Absolute: 0 10*3/uL (ref 0.0–0.1)
Basophils Relative: 1 %
Eosinophils Absolute: 0.1 10*3/uL (ref 0.0–0.5)
Eosinophils Relative: 2 %
HCT: 35.6 % — ABNORMAL LOW (ref 36.0–46.0)
Hemoglobin: 12.3 g/dL (ref 12.0–15.0)
Immature Granulocytes: 0 %
Lymphocytes Relative: 31 %
Lymphs Abs: 1.3 10*3/uL (ref 0.7–4.0)
MCH: 34.5 pg — ABNORMAL HIGH (ref 26.0–34.0)
MCHC: 34.6 g/dL (ref 30.0–36.0)
MCV: 99.7 fL (ref 80.0–100.0)
Monocytes Absolute: 0.5 10*3/uL (ref 0.1–1.0)
Monocytes Relative: 13 %
Neutro Abs: 2.2 10*3/uL (ref 1.7–7.7)
Neutrophils Relative %: 53 %
Platelets: 157 10*3/uL (ref 150–400)
RBC: 3.57 MIL/uL — ABNORMAL LOW (ref 3.87–5.11)
RDW: 12.9 % (ref 11.5–15.5)
WBC: 4.1 10*3/uL (ref 4.0–10.5)
nRBC: 0 % (ref 0.0–0.2)

## 2021-02-18 LAB — COMPREHENSIVE METABOLIC PANEL
ALT: 31 U/L (ref 0–44)
AST: 31 U/L (ref 15–41)
Albumin: 4.1 g/dL (ref 3.5–5.0)
Alkaline Phosphatase: 89 U/L (ref 38–126)
Anion gap: 6 (ref 5–15)
BUN: 18 mg/dL (ref 8–23)
CO2: 28 mmol/L (ref 22–32)
Calcium: 8.8 mg/dL — ABNORMAL LOW (ref 8.9–10.3)
Chloride: 105 mmol/L (ref 98–111)
Creatinine, Ser: 0.68 mg/dL (ref 0.44–1.00)
GFR, Estimated: >60 mL/min (ref >60)
Glucose, Bld: 75 mg/dL (ref 70–99)
Potassium: 4.1 mmol/L (ref 3.5–5.1)
Sodium: 139 mmol/L (ref 135–145)
Total Bilirubin: 0.4 mg/dL (ref 0.3–1.2)
Total Protein: 6.6 g/dL (ref 6.5–8.1)

## 2021-02-18 LAB — FOLATE: Folate: 60 ng/mL (ref >5.9)

## 2021-02-18 LAB — VITAMIN B12: Vitamin B-12: 509 pg/mL (ref 180–914)

## 2021-02-18 MED ORDER — SODIUM CHLORIDE 0.9 % IV SOLN
150.0000 mg | Freq: Once | INTRAVENOUS | Status: AC
  Administered 2021-02-18: 150 mg via INTRAVENOUS
  Filled 2021-02-18: qty 150

## 2021-02-18 MED ORDER — DIPHENHYDRAMINE HCL 25 MG PO CAPS: 25.0000 mg | ORAL_CAPSULE | Freq: Once | ORAL | Status: DC

## 2021-02-18 MED ORDER — SODIUM CHLORIDE 0.9 % IV SOLN
10.0000 mg | Freq: Once | INTRAVENOUS | Status: AC
  Administered 2021-02-18: 10 mg via INTRAVENOUS
  Filled 2021-02-18: qty 10

## 2021-02-18 MED ORDER — DIPHENHYDRAMINE HCL 25 MG PO CAPS
25.0000 mg | ORAL_CAPSULE | Freq: Once | ORAL | Status: AC
  Administered 2021-02-18: 25 mg via ORAL
  Filled 2021-02-18: qty 1

## 2021-02-18 MED ORDER — SODIUM CHLORIDE 0.9 % IV SOLN
150.0000 mg/m2 | Freq: Once | INTRAVENOUS | Status: AC
  Administered 2021-02-18: 260 mg via INTRAVENOUS
  Filled 2021-02-18: qty 10

## 2021-02-18 MED ORDER — PALONOSETRON HCL INJECTION 0.25 MG/5ML
0.2500 mg | Freq: Once | INTRAVENOUS | Status: AC
  Administered 2021-02-18: 0.25 mg via INTRAVENOUS
  Filled 2021-02-18: qty 5

## 2021-02-18 MED ORDER — SODIUM CHLORIDE 0.9 % IV SOLN
2400.0000 mg/m2 | INTRAVENOUS | Status: DC
  Administered 2021-02-18: 4000 mg via INTRAVENOUS
  Filled 2021-02-18: qty 80

## 2021-02-18 MED ORDER — SODIUM CHLORIDE 0.9% FLUSH
10.0000 mL | INTRAVENOUS | Status: AC | PRN
  Administered 2021-02-18: 10 mL via INTRAVENOUS
  Filled 2021-02-18: qty 10

## 2021-02-18 MED ORDER — ATROPINE SULFATE 1 MG/ML IJ SOLN
0.5000 mg | Freq: Once | INTRAMUSCULAR | Status: AC
  Administered 2021-02-18: 0.5 mg via INTRAVENOUS
  Filled 2021-02-18: qty 1

## 2021-02-18 MED ORDER — SODIUM CHLORIDE 0.9 % IV SOLN
650.0000 mg | Freq: Once | INTRAVENOUS | Status: AC
  Administered 2021-02-18: 650 mg via INTRAVENOUS
  Filled 2021-02-18: qty 32.5

## 2021-02-18 MED ORDER — DEXTROSE 5 % IV SOLN
Freq: Once | INTRAVENOUS | Status: AC
  Filled 2021-02-18: qty 250

## 2021-02-18 MED ORDER — SODIUM CHLORIDE 0.9% FLUSH
10.0000 mL | INTRAVENOUS | Status: DC | PRN
  Filled 2021-02-18: qty 10

## 2021-02-18 NOTE — Patient Instructions (Signed)
[facility]  Discharge Instructions: Thank you for choosing [facility] to provide your oncology and hematology care.  If you have a lab appointment with the [facility], please go directly to the [facility] and check in at the registration area.  Wear comfortable clothing and clothing appropriate for easy access to any Portacath or PICC line.   We strive to give you quality time with your provider. You may need to reschedule your appointment if you arrive late (15 or more minutes).  Arriving late affects you and other patients whose appointments are after yours.  Also, if you miss three or more appointments without notifying the office, you may be dismissed from the clinic at the provider's discretion.      For prescription refill requests, have your pharmacy contact our office and allow 72 hours for refills to be completed.    Today you received the following chemotherapy and/or immunotherapy agents - irinotecan, fluorouracil      To help prevent nausea and vomiting after your treatment, we encourage you to take your nausea medication as directed.  BELOW ARE SYMPTOMS THAT SHOULD BE REPORTED IMMEDIATELY: *FEVER GREATER THAN 100.4 F (38 C) OR HIGHER *CHILLS OR SWEATING *NAUSEA AND VOMITING THAT IS NOT CONTROLLED WITH YOUR NAUSEA MEDICATION *UNUSUAL SHORTNESS OF BREATH *UNUSUAL BRUISING OR BLEEDING *URINARY PROBLEMS (pain or burning when urinating, or frequent urination) *BOWEL PROBLEMS (unusual diarrhea, constipation, pain near the anus) TENDERNESS IN MOUTH AND THROAT WITH OR WITHOUT PRESENCE OF ULCERS (sore throat, sores in mouth, or a toothache) UNUSUAL RASH, SWELLING OR PAIN  UNUSUAL VAGINAL DISCHARGE OR ITCHING   Items with * indicate a potential emergency and should be followed up as soon as possible or go to the Emergency Department if any problems should occur.  Please show the CHEMOTHERAPY ALERT CARD or IMMUNOTHERAPY ALERT  CARD at check-in to the Emergency Department and triage nurse.  Should you have questions after your visit or need to cancel or reschedule your appointment, please contact [facility]  [phone] and follow the prompts.  Office hours are 8:00 a.m. to 4:30 p.m. Monday - Friday. Please note that voicemails left after 4:00 p.m. may not be returned until the following business day.  We are closed weekends and major holidays. You have access to a nurse at all times for urgent questions. Please call the main number to the clinic [phone] and follow the prompts.  For any non-urgent questions, you may also contact your provider using MyChart. We now offer e-Visits for anyone 76 and older to request care online for non-urgent symptoms. For details visit [email or web address].   Also download the MyChart app! Go to the app store, search "MyChart", open the app, select [facility], and log in with your MyChart username and password.  Due to Covid, a mask is required upon entering the hospital/clinic. If you do not have a mask, one will be given to you upon arrival. For doctor visits, patients may have 1 support person aged 21 or older with them. For treatment visits, patients cannot have anyone with them due to current Covid guidelines and our immunocompromised population.   Irinotecan injection What is this medication? IRINOTECAN (ir in oh TEE kan ) is a chemotherapy drug. It is used to treatcolon and rectal cancer. This medicine may be used for other purposes; ask your health care provider orpharmacist if you have questions. COMMON BRAND NAME(S): Camptosar What should I tell my care  team before I take this medication? They need to know if you have any of these conditions: dehydration diarrhea infection (especially a virus infection such as chickenpox, cold sores, or herpes) liver disease low blood counts, like low white cell, platelet, or red cell counts low  levels of calcium, magnesium, or potassium in the blood recent or ongoing radiation therapy an unusual or allergic reaction to irinotecan, other medicines, foods, dyes, or preservatives pregnant or trying to get pregnant breast-feeding How should I use this medication? This drug is given as an infusion into a vein. It is administered in a hospitalor clinic by a specially trained health care professional. Talk to your pediatrician regarding the use of this medicine in children.Special care may be needed. Overdosage: If you think you have taken too much of this medicine contact apoison control center or emergency room at once. NOTE: This medicine is only for you. Do not share this medicine with others. What if I miss a dose? It is important not to miss your dose. Call your doctor or health careprofessional if you are unable to keep an appointment. What may interact with this medication? Do not take this medicine with any of the following medications: cobicistat itraconazole This medicine may interact with the following medications: antiviral medicines for HIV or AIDS certain antibiotics like rifampin or rifabutin certain medicines for fungal infections like ketoconazole, posaconazole, and voriconazole certain medicines for seizures like carbamazepine, phenobarbital, phenotoin clarithromycin gemfibrozil nefazodone [facility] John's Wort This list may not describe all possible interactions. Give your health care provider a list of all the medicines, herbs, non-prescription drugs, or dietary supplements you use. Also tell them if you smoke, drink alcohol, or use illegaldrugs. Some items may interact with your medicine. What should I watch for while using this medication? Your condition will be monitored carefully while you are receiving this medicine. You will need important blood work done while you are taking thismedicine. This drug may make you feel generally unwell. This is not uncommon, as  chemotherapy can affect healthy cells as well as cancer cells. Report any side effects. Continue your course of treatment even though you feel ill unless yourdoctor tells you to stop. In some cases, you may be given additional medicines to help with side effects.Follow all directions for their use. You may get drowsy or dizzy. Do not drive, use machinery, or do anything that needs mental alertness until you know how this medicine affects you. Do not stand or sit up quickly, especially if you are an older patient. This reducesthe risk of dizzy or fainting spells. Call your health care professional for advice if you get a fever, chills, or sore throat, or other symptoms of a cold or flu. Do not treat yourself. This medicine decreases your body's ability to fight infections. Try to avoid beingaround people who are sick. Avoid taking products that contain aspirin, acetaminophen, ibuprofen, naproxen, or ketoprofen unless instructed by your doctor. These medicines may hide afever. This medicine may increase your risk to bruise or bleed. Call your doctor orhealth care professional if you notice any unusual bleeding. Be careful brushing and flossing your teeth or using a toothpick because you may get an infection or bleed more easily. If you have any dental work done,tell your dentist you are receiving this medicine. Do not become pregnant while taking this medicine or for 6 months after stopping it. Women should inform their health care professional if they wish to become pregnant or think they might be pregnant. Men  should not father a child while taking this medicine and for 3 months after stopping it. There is potential for serious side effects to an unborn child. Talk to your health careprofessional for more information. Do not breast-feed an infant while taking this medicine or for 7 days afterstopping it. This medicine has caused ovarian failure in some women. This medicine may make it more difficult to get  pregnant. Talk to your health care professional if [place] concerned about your fertility. This medicine has caused decreased sperm counts in some men. This may make it more difficult to father a child. Talk to your health care professional if [place] concerned about your fertility. What side effects may I notice from receiving this medication? Side effects that you should report to your doctor or health care professionalas soon as possible: allergic reactions like skin rash, itching or hives, swelling of the face, lips, or tongue chest pain diarrhea flushing, runny nose, sweating during infusion low blood counts - this medicine may decrease the number of white blood cells, red blood cells and platelets. You may be at increased risk for infections and bleeding. nausea, vomiting pain, swelling, warmth in the leg signs of decreased platelets or bleeding - bruising, pinpoint red spots on the skin, black, tarry stools, blood in the urine signs of infection - fever or chills, cough, sore throat, pain or difficulty passing urine signs of decreased red blood cells - unusually weak or tired, fainting spells, lightheadedness Side effects that usually do not require medical attention (report to yourdoctor or health care professional if they continue or are bothersome): constipation hair loss headache loss of appetite mouth sores stomach pain This list may not describe all possible side effects. Call your doctor for medical advice about side effects. You may report side effects to FDA at1-800-FDA-1088. Where should I keep my medication? This drug is given in a hospital or clinic and will not be stored at home. NOTE: This sheet is a summary. It may not cover all possible information. If you have questions about this medicine, talk to your doctor, pharmacist, orhealth care provider.  2022 Elsevier/Gold Standard (2019-05-15 17:46:13)  Fluorouracil, 5-FU injection What is this medication? FLUOROURACIL,  5-FU (flure oh YOOR a sil) is a chemotherapy drug. It slows the growth of cancer cells. This medicine is used to treat many types of cancer like breast cancer, colon or rectal cancer, pancreatic cancer, and stomachcancer. This medicine may be used for other purposes; ask your health care provider orpharmacist if you have questions. COMMON BRAND NAME(S): Adrucil What should I tell my care team before I take this medication? They need to know if you have any of these conditions: blood disorders dihydropyrimidine dehydrogenase (DPD) deficiency infection (especially a virus infection such as chickenpox, cold sores, or herpes) kidney disease liver disease malnourished, poor nutrition recent or ongoing radiation therapy an unusual or allergic reaction to fluorouracil, other chemotherapy, other medicines, foods, dyes, or preservatives pregnant or trying to get pregnant breast-feeding How should I use this medication? This drug is given as an infusion or injection into a vein. It is administeredin a hospital or clinic by a specially trained health care professional. Talk to your pediatrician regarding the use of this medicine in children.Special care may be needed. Overdosage: If you think you have taken too much of this medicine contact apoison control center or emergency room at once. NOTE: This medicine is only for you. Do not share this medicine with others. What if I miss a dose?  It is important not to miss your dose. Call your doctor or health careprofessional if you are unable to keep an appointment. What may interact with this medication? Do not take this medicine with any of the following medications: live virus vaccines This medicine may also interact with the following medications: medicines that treat or prevent blood clots like warfarin, enoxaparin, and dalteparin This list may not describe all possible interactions. Give your health care provider a list of all the medicines, herbs,  non-prescription drugs, or dietary supplements you use. Also tell them if you smoke, drink alcohol, or use illegaldrugs. Some items may interact with your medicine. What should I watch for while using this medication? Visit your doctor for checks on your progress. This drug may make you feel generally unwell. This is not uncommon, as chemotherapy can affect healthy cells as well as cancer cells. Report any side effects. Continue your course oftreatment even though you feel ill unless your doctor tells you to stop. In some cases, you may be given additional medicines to help with side effects.Follow all directions for their use. Call your doctor or health care professional for advice if you get a fever, chills or sore throat, or other symptoms of a cold or flu. Do not treat yourself. This drug decreases your body's ability to fight infections. Try toavoid being around people who are sick. This medicine may increase your risk to bruise or bleed. Call your doctor orhealth care professional if you notice any unusual bleeding. Be careful brushing and flossing your teeth or using a toothpick because you may get an infection or bleed more easily. If you have any dental work done,tell your dentist you are receiving this medicine. Avoid taking products that contain aspirin, acetaminophen, ibuprofen, naproxen, or ketoprofen unless instructed by your doctor. These medicines may hide afever. Do not become pregnant while taking this medicine. Women should inform their doctor if they wish to become pregnant or think they might be pregnant. There is a potential for serious side effects to an unborn child. Talk to your health care professional or pharmacist for more information. Do not breast-feed aninfant while taking this medicine. Men should inform their doctor if they wish to father a child. This medicinemay lower sperm counts. Do not treat diarrhea with over the counter products. Contact your doctor ifyou have  diarrhea that lasts more than 2 days or if it is severe and watery. This medicine can make you more sensitive to the sun. Keep out of the sun. If you cannot avoid being in the sun, wear protective clothing and use sunscreen.Do not use sun lamps or tanning beds/booths. What side effects may I notice from receiving this medication? Side effects that you should report to your doctor or health care professionalas soon as possible: allergic reactions like skin rash, itching or hives, swelling of the face, lips, or tongue low blood counts - this medicine may decrease the number of white blood cells, red blood cells and platelets. You may be at increased risk for infections and bleeding. signs of infection - fever or chills, cough, sore throat, pain or difficulty passing urine signs of decreased platelets or bleeding - bruising, pinpoint red spots on the skin, black, tarry stools, blood in the urine signs of decreased red blood cells - unusually weak or tired, fainting spells, lightheadedness breathing problems changes in vision chest pain mouth sores nausea and vomiting pain, swelling, redness at site where injected pain, tingling, numbness in the hands or feet redness, swelling,  or sores on hands or feet stomach pain unusual bleeding Side effects that usually do not require medical attention (report to yourdoctor or health care professional if they continue or are bothersome): changes in finger or toe nails diarrhea dry or itchy skin hair loss headache loss of appetite sensitivity of eyes to the light stomach upset unusually teary eyes This list may not describe all possible side effects. Call your doctor for medical advice about side effects. You may report side effects to FDA at1-800-FDA-1088. Where should I keep my medication? This drug is given in a hospital or clinic and will not be stored at home. NOTE: This sheet is a summary. It may not cover all possible information. If you have  questions about this medicine, talk to your doctor, pharmacist, orhealth care provider.  2022 Elsevier/Gold Standard (2019-05-15 15:00:03)

## 2021-02-18 NOTE — Progress Notes (Signed)
Hematology/Oncology follow up note [facility] [phone] [phone]   Patient Care Team: [name], MD as PCP - General (Family Medicine) Flinchum, Kelby Aline, FNP (Family Medicine) [name], RN as Oncology Nurse Navigator [name], MD as Consulting Physician (Oncology)  REFERRING PROVIDER: [name], MD  CHIEF COMPLAINTS/REASON FOR VISIT:  Follow up for treatment of pancreatic adenocarcinoma  HISTORY OF PRESENTING ILLNESS:   Cassandra [name] is a  65 y.o.  female with PMH listed below was seen in consultation at the request of  [name], MD  for evaluation of pancreatic adenocarcinoma Patient initially presented with jaundice, transaminitis, bilirubin was 9.9.  CA 19-9 was 1874.  Patient also reports unintentional weight loss. 02/08/2020 MRI abdomen and MRCP with and without contrast was done at [facility] which showed pancreatic head mass measuring up to 3 cm, with marked associated narrowing of the portal vein confluence.  SMA is preserved.  Marked intrahepatic and extrahepatic biliary duct dilatation as well as mild dilatation of the main pancreatic duct. Multiple hepatic masses highly concerning for metastatic disease.  Patient underwent EUS on 02/19/2020, which showed irregular mass identified in the pancreatic head, hypoechoic, measured 45mx33mm, sonographic evidence concerning for invasion into the superior mesenteric artery.  There is no sign of significant abnormality in the main pancreatic duct.  Dilatation of common bile duct which measured up to 16 mm.  Region of celiac artery was visualized and showed no signs of significant abnormality.  No lymphadenopathy.  FNA showed adenocarcinoma.  02/19/2020, ERCP, malignant.  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  The stricture was treated with placement of wall flex metal stent.  [facility] ZPia Mauand was recommended for 3 drug regimen FOLFIRINOX.  [facility]  Patient was referred to establish care today. She denies any pain.  Since stent placement, skin jaundice has improved.  Itchiness has also improved. Patient was accompanied by her husband today.  She has a family history of breast cancer in sister and paternal aunt, colon cancer paternal grandmother.  #no reportable targetable mutation on NGS 9/14/2021cycle 1 FOLFIRINOX.  Patient received oxaliplatin and about 50% of Irinotecan on day 1 and had experienced neurologic symptoms.  She went to ER and working diagnosis is TIA, and eventually I think this is due to irinotecan side effects -irinotecan-associated dysarthria, lip/tongue numbness.  Adjustment was made for Irinotecan to be  infused over 180 minutes.  Atropine 0.5 mg once prior to the irinotecan. No recurrent symptoms.   # 03/19/2020-03/21/2020 patient was admitted due to sepsis with strep pneumonia bacteremia.  Patient was treated with IV Rocephin.  TEE was done which showed no vegetation.  No PFO or ASD.  Patient was discharged home and he finished full course of 14 days of IV Rocephin on 04/02/2020 per ID recommendation.  Repeat blood culture was also negative.  08/25/20 cycle 10 FOLFIRINOX 09/08/20- present  Starting cycle 11, FOLFIRI, Oxaliplatin discontinued due to neuropathy   #NGS showed no reportable targetable mutation #Genetic testing-[employer] diagnostic testing showed no pathological variants identified. MS stable, TMB 059m/mb, KRAS G12D, SF3B1 K700E, TP53 [id number]  #01/14/2021  [facility], stable disease.  INTERVAL HISTORY Cassandra [name] a 65 67o. female who has above history reviewed by me today presents for evaluation prior to chemotherapy. She feels well today.  She has gained weight. Take Creon and bloating has improved.  She has noticed some constipation on day 2/3  of the treatments  No new complaints.   Her case was presented by Dr.Jia from [facility] tumor board. Clayborne Artist were 4~5 liver lesions on OSH MRI last year at the time of diagnosis highly suspicious for metastasis. She is not eligible for surgical protocol for metastasis resection given the number of liver metastasis is >3. However given her excellent and durable response to chemo, surgical resection may be considered pending sustained disease control at 1 year and may require partial hepatectomy first to see if there is viable tumor before proceeding to Whipple resection ] She will repeat a restaging CT/MRI and see Dr. [name] Ducking in late September to discuss potential surgical resection.  Review of Systems  Constitutional:  Negative for appetite change, chills, fatigue, fever and unexpected weight change.  HENT:   Negative for hearing loss and voice change.   Eyes:  Negative for eye problems.  Respiratory:  Negative for chest tightness and cough.   Cardiovascular:  Negative for chest pain.  Gastrointestinal:  Negative for abdominal distention, abdominal pain and blood in stool.  Endocrine: Negative for hot flashes.  Genitourinary:  Negative for difficulty urinating and frequency.   Musculoskeletal:  Negative for arthralgias.  Skin:  Negative for itching and rash.  Neurological:  Positive for numbness. Negative for extremity weakness.  Hematological:  Negative for adenopathy.  Psychiatric/Behavioral:  Negative for confusion.    MEDICAL HISTORY:  Past Medical History:  Diagnosis Date   Cancer [facility])    pancreatic cancer   Colon polyps    Family history of breast cancer    Neutropenia (HCC) 04/18/2020   Osteopenia after menopause 05/2017   femoral neck T score -2.0   Personal history of chemotherapy    current for pancreatic ca    SURGICAL HISTORY: Past Surgical History:  Procedure Laterality Date   COLONOSCOPY  07/2015   WNL   COLONOSCOPY  2008/2011   OVARIAN CYST REMOVAL  1992   dermoid-Dr  CAK   PORTA CATH INSERTION N/A 03/07/2020   Procedure: PORTA CATH INSERTION;  Surgeon: Annice Needy, MD;  Location: [facility] INVASIVE CV LAB;  Service: Cardiovascular;  Laterality: N/A;   TEE WITHOUT CARDIOVERSION N/A 03/21/2020   Procedure: TRANSESOPHAGEAL ECHOCARDIOGRAM (TEE);  Surgeon: Laurier Nancy, MD;  Location: [facility] ORS;  Service: Cardiovascular;  Laterality: N/A;   TUBAL LIGATION  1993    SOCIAL HISTORY: Social History   Socioeconomic History   Marital status: Married    Spouse name: Not on file   Number of children: 2   Years of education: Not on file   Highest education level: Not on file  Occupational History   Occupation: Runner, broadcasting/film/video    Comment: retired   Occupation: Visual merchandiser  Tobacco Use   Smoking status: Former    Packs/day: 0.50    Years: 10.00    Pack years: 5.00    Types: Cigarettes    Quit date: 1987    Years since quitting: 35.6   Smokeless tobacco: Never   Tobacco comments:    Quit smoking 1987  Vaping Use   Vaping Use: Never used  Substance and Sexual Activity   Alcohol use: Not Currently    Comment: 0-2 mixed drinks a day   Drug use: No   Sexual activity: Not Currently    Birth control/protection: Post-menopausal  Other Topics Concern   Not on file  Social History Narrative  Not on file   Social Determinants of Health   Financial Resource Strain: Not on file  Food Insecurity: Not on file  Transportation Needs: Not on file  Physical Activity: Not on file  Stress: Not on file  Social Connections: Not on file  Intimate Partner Violence: Not on file    FAMILY HISTORY: Family History  Problem Relation Age of Onset   Breast cancer Paternal Aunt 64   Diabetes Mother    Osteoporosis Mother    Hyperlipidemia Father    Rheumatic fever Father    Valvular heart disease Father    Cancer Paternal Grandmother        possible colon   Breast cancer Sister 68    ALLERGIES:  is allergic to penicillin g.  MEDICATIONS:  Current Outpatient Medications   Medication Sig Dispense Refill   Calcium Carbonate-Vit D-Min (CALCIUM 1200 PO) Take by mouth.     Cholecalciferol 25 MCG (1000 UT) tablet Take 2,000 Units by mouth daily.     Cyanocobalamin (VITAMIN B 12 PO) Take by mouth.     lidocaine-prilocaine (EMLA) cream Apply to affected area once (Patient taking differently: Apply 1 application topically as directed.) 30 g 3   Multiple Vitamins-Minerals (MULTIVITAMIN WITH MINERALS) tablet Take 1 tablet by mouth daily.     ondansetron (ZOFRAN) 8 MG tablet Take 1 tablet (8 mg total) by mouth 2 (two) times daily as needed. Start on day 3 after chemotherapy. 30 tablet 1   Pancrelipase, Lip-Prot-Amyl, (CREON) 24000-76000 units CPEP Take 1 capsule (24,000 Units total) by mouth 3 (three) times daily before meals. 90 capsule 3   potassium chloride SA (KLOR-CON M20) 20 MEQ tablet Take 1 tablet (20 mEq total) by mouth daily. (Patient taking differently: Take 20 mEq by mouth daily. QOD) 30 tablet 0   prochlorperazine (COMPAZINE) 10 MG tablet Take 1 tablet (10 mg total) by mouth every 6 (six) hours as needed (Nausea or vomiting). 30 tablet 1   simethicone (GAS-X) 80 MG chewable tablet Chew 1 tablet (80 mg total) by mouth every 8 (eight) hours as needed for flatulence. 60 tablet 0   loperamide (IMODIUM A-D) 2 MG tablet Take 2 at onset of diarrhea, then 1 every 2hrs until 12hr without a BM. May take 2 tab every 4hrs at bedtime. If diarrhea recurs repeat. (Patient not taking: No sig reported) 100 tablet 1   magic mouthwash w/lidocaine SOLN Take 5 mLs by mouth 4 (four) times daily as needed for mouth pain. Sig: Swish & Spit 5-10 ml four times a day as needed. Dispense 480 ml. 1RF (Patient not taking: No sig reported) 480 mL 1   No current facility-administered medications for this visit.   Facility-Administered Medications Ordered in Other Visits  Medication Dose Route Frequency Provider Last Rate Last Admin   diphenhydrAMINE (BENADRYL) capsule 25 mg  25 mg Oral Once [name], MD       fluorouracil (ADRUCIL) 4,000 mg in sodium chloride 0.9 % 70 mL chemo infusion  2,400 mg/m2 (Order-Specific) Intravenous 1 day or 1 dose [name], MD   4,000 mg at 02/18/21 1425   sodium chloride flush (NS) 0.9 % injection 10 mL  10 mL Intravenous PRN [name], MD   10 mL at 02/18/21 0858   sodium chloride flush (NS) 0.9 % injection 10 mL  10 mL Intracatheter PRN [name], MD         PHYSICAL EXAMINATION: ECOG PERFORMANCE STATUS: 1 - Symptomatic but completely ambulatory Vitals:  02/18/21 0909  BP: 120/84  Pulse: 65  Temp: 99.1 F (37.3 C)  SpO2: 100%   Filed Weights   02/18/21 0909  Weight: 129 lb (58.5 kg)    Physical Exam Constitutional:      General: She is not in acute distress. HENT:     Head: Normocephalic and atraumatic.  Eyes:     General: No scleral icterus. Cardiovascular:     Rate and Rhythm: Normal rate and regular rhythm.     Heart sounds: Normal heart sounds.  Pulmonary:     Effort: Pulmonary effort is normal. No respiratory distress.     Breath sounds: No wheezing.  Abdominal:     General: Bowel sounds are normal. There is no distension.     Palpations: Abdomen is soft.  Musculoskeletal:        General: No deformity. Normal range of motion.     Cervical back: Normal range of motion and neck supple.  Skin:    General: Skin is warm and dry.     Coloration: Skin is not jaundiced.     Findings: No erythema or rash.  Neurological:     Mental Status: She is alert and oriented to person, place, and time. Mental status is at baseline.     Cranial Nerves: No cranial nerve deficit.     Coordination: Coordination normal.  Psychiatric:        Mood and Affect: Mood normal.    LABORATORY DATA:  I have reviewed the data as listed Lab Results  Component Value Date   WBC 4.1 02/18/2021   HGB 12.3 02/18/2021   HCT 35.6 (L) 02/18/2021   MCV 99.7 02/18/2021   PLT 157 02/18/2021   Recent Labs    03/19/20 1024 03/20/20 0411 03/21/20 0318  04/04/20 0827 01/20/21 0912 02/04/21 0835 02/18/21 0845  NA 129* 136 140   < > 138 137 139  K 3.8 4.0 3.7   < > 4.1 4.1 4.1  CL 95* 104 108   < > 106 105 105  CO2 $Re'25 25 25   'OnJ$ < > $R'25 26 28  'db$ GLUCOSE 145* 126* 114*   < > 110* 100* 75  BUN 11 7* 6*   < > $R'21 20 18  'FE$ CREATININE 0.85 0.66 0.52   < > 0.65 0.66 0.68  CALCIUM 8.9 8.3* 8.2*   < > 8.8* 8.6* 8.8*  GFRNONAA >60 >60 >60   < > >60 >60 >60  GFRAA >60 >60 >60  --   --   --   --   PROT 7.1  --  5.3*   < > 6.3* 6.3* 6.6  ALBUMIN 3.5  --  2.5*   < > 4.2 4.1 4.1  AST 30  --  52*   < > 34 27 31  ALT 43  --  74*   < > 37 29 31  ALKPHOS 125  --  112   < > 91 89 89  BILITOT 1.5*  --  1.0   < > 0.4 0.4 0.4   < > = values in this interval not displayed.    Iron/TIBC/Ferritin/ %Sat No results found for: IRON, TIBC, FERRITIN, IRONPCTSAT    RADIOGRAPHIC STUDIES: I have personally reviewed the radiological images as listed and agreed with the findings in the report. Reviewed findings of MRI abdomen MRCP done at [facility]. MM 3D SCREEN BREAST BILATERAL  Result Date: 01/02/2021 CLINICAL DATA:  Screening. EXAM: DIGITAL SCREENING BILATERAL MAMMOGRAM WITH TOMOSYNTHESIS AND CAD  TECHNIQUE: Bilateral screening digital craniocaudal and mediolateral oblique mammograms were obtained. Bilateral screening digital breast tomosynthesis was performed. The images were evaluated with computer-aided detection. COMPARISON:  Previous exam(s). ACR Breast Density Category c: The breast tissue is heterogeneously dense, which may obscure small masses. FINDINGS: There are no findings suspicious for malignancy. IMPRESSION: No mammographic evidence of malignancy. A result letter of this screening mammogram will be mailed directly to the patient. RECOMMENDATION: Screening mammogram in one year (as long as the patient has a life expectancy of 10+ years with her diagnosis of pancreatic carcinoma). (Code:SM-B-01Y) BI-RADS CATEGORY  1: Negative. Electronically Signed   By: Evangeline Dakin M.D.   On: 01/02/2021 13:53     ASSESSMENT & PLAN:  1. Primary pancreatic cancer ([place])   2. 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 Pancreatic insufficiency   5. 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 Proceed with maintenance FOLFIRI today. Pending restaging by [facility] surgery for possible Whipple surgery.   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  Symptoms are better after the start of Creon,continue  24000 units TID with meals.  Continue.  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   Patient follows up with neurology for management.Marland Kitchen   #Chemotherapy-induced anemia.  Hemoglobin is stable at 12.3 monitor closely.   #Macrocytic anemia patient has a vitamin B12 level of 5.9.  Folate level 60.  Continue vitamin B12 supplementation.  MCV has improved after started on B12 supplementation.   #Elevated CEA,-this may be related to her pancreatic cancer.  Patient is due for colonoscopy and I recommend patient to follow-up with gastroenterology   We spent sufficient time to discuss many aspect of care, questions were answered to patient's satisfaction.    All questions were answered. The patient knows to call the clinic with any problems questions or concerns.  Return of visit: 3 weeks-patient prefers to delay 1 week due to visiting a family members.  [name], MD, PhD Hematology Oncology [facility] at [facility] Pager- [phone] 02/18/2021

## 2021-02-20 ENCOUNTER — Inpatient Hospital Stay: Payer: Medicare Other

## 2021-02-20 ENCOUNTER — Other Ambulatory Visit: Payer: Self-pay

## 2021-02-20 VITALS — BP 121/74 | HR 66 | Temp 97.7°F | Resp 20

## 2021-02-20 DIAGNOSIS — C259 Malignant neoplasm of pancreas, unspecified: Secondary | ICD-10-CM

## 2021-02-20 DIAGNOSIS — Z5111 Encounter for antineoplastic chemotherapy: Secondary | ICD-10-CM | POA: Diagnosis not present

## 2021-02-20 MED ORDER — HEPARIN SOD (PORK) LOCK FLUSH 100 UNIT/ML IV SOLN
INTRAVENOUS | Status: AC
  Administered 2021-02-20: 500 [IU]
  Filled 2021-02-20: qty 5

## 2021-02-20 MED ORDER — SODIUM CHLORIDE 0.9% FLUSH
10.0000 mL | INTRAVENOUS | Status: DC | PRN
  Administered 2021-02-20: 10 mL
  Filled 2021-02-20: qty 10

## 2021-02-20 MED ORDER — HEPARIN SOD (PORK) LOCK FLUSH 100 UNIT/ML IV SOLN
500.0000 [IU] | Freq: Once | INTRAVENOUS | Status: AC | PRN
  Filled 2021-02-20: qty 5

## 2021-02-25 ENCOUNTER — Ambulatory Visit: Payer: Medicare Other

## 2021-02-25 ENCOUNTER — Other Ambulatory Visit: Payer: Medicare Other

## 2021-02-25 ENCOUNTER — Ambulatory Visit: Payer: Medicare Other | Admitting: Oncology

## 2021-03-06 ENCOUNTER — Telehealth: Payer: Self-pay | Admitting: *Deleted

## 2021-03-06 NOTE — Telephone Encounter (Signed)
Patient called reporting that she just tested positive for COVID, she is asymptomatic and has an appointment Monday for treatment and is asking what she needs to do. Please advise

## 2021-03-06 NOTE — Telephone Encounter (Signed)
I have sent pt Mychart message to let her know that appts will be moved out 2 weeks. Please r/s tx appts and nutrition appt. Thanks

## 2021-03-06 NOTE — Telephone Encounter (Signed)
Please advise regarding antiviral medication.

## 2021-03-09 ENCOUNTER — Inpatient Hospital Stay: Payer: Medicare Other

## 2021-03-09 ENCOUNTER — Inpatient Hospital Stay: Payer: Medicare Other | Attending: Oncology | Admitting: Nurse Practitioner

## 2021-03-09 ENCOUNTER — Inpatient Hospital Stay: Payer: Medicare Other | Admitting: Oncology

## 2021-03-09 DIAGNOSIS — C787 Secondary malignant neoplasm of liver and intrahepatic bile duct: Secondary | ICD-10-CM | POA: Insufficient documentation

## 2021-03-09 DIAGNOSIS — Z87891 Personal history of nicotine dependence: Secondary | ICD-10-CM | POA: Insufficient documentation

## 2021-03-09 DIAGNOSIS — G620 Drug-induced polyneuropathy: Secondary | ICD-10-CM | POA: Insufficient documentation

## 2021-03-09 DIAGNOSIS — C259 Malignant neoplasm of pancreas, unspecified: Secondary | ICD-10-CM

## 2021-03-09 DIAGNOSIS — R7401 Elevation of levels of liver transaminase levels: Secondary | ICD-10-CM | POA: Insufficient documentation

## 2021-03-09 DIAGNOSIS — U071 COVID-19: Secondary | ICD-10-CM

## 2021-03-09 DIAGNOSIS — C250 Malignant neoplasm of head of pancreas: Secondary | ICD-10-CM | POA: Insufficient documentation

## 2021-03-09 DIAGNOSIS — T451X5A Adverse effect of antineoplastic and immunosuppressive drugs, initial encounter: Secondary | ICD-10-CM | POA: Insufficient documentation

## 2021-03-09 DIAGNOSIS — Z8616 Personal history of COVID-19: Secondary | ICD-10-CM | POA: Insufficient documentation

## 2021-03-09 DIAGNOSIS — K8689 Other specified diseases of pancreas: Secondary | ICD-10-CM | POA: Insufficient documentation

## 2021-03-09 MED ORDER — NIRMATRELVIR/RITONAVIR (PAXLOVID)TABLET: 3.0000 | ORAL_TABLET | Freq: Two times a day (BID) | ORAL | 0 refills | Status: AC

## 2021-03-09 NOTE — Patient Instructions (Signed)
You are being prescribed PAXLOVID for COVID-19 infection.   Please [facility]  Please call the pharmacy or go through the drive through vs going inside if you are picking up the mediation yourself to prevent further spread. If prescribed to a [facility] affiliated pharmacy, a pharmacist will bring the medication out to your car.  ADMINISTRATION INSTRUCTIONS: Take with or without food. Swallow the tablets whole. Don't chew, crush, or break the medications because it might not work as well  For each dose of the medication, you should be taking 3 tablets TWICE a day for FIVE days   Finish your full five-day course of Paxlovid even if you feel better before you're done. Stopping this medication too early can make it less effective to prevent severe illness related to [place].    Paxlovid is prescribed for YOU ONLY. Don't share it with others, even if they have similar symptoms as you. This medication might not be right for everyone.  Make sure to take steps to protect yourself and others while you're taking this medication in order to get well soon and to prevent others from getting sick with COVID-19.  Paxlovid (nirmatrelvir / ritonavir) can cause hormonal birth control medications to not work well. If you or your partner is currently taking hormonal birth control, use condoms or other birth control methods to prevent unintended pregnancies.  COMMON SIDE EFFECTS: Altered or bad taste in your mouth  Diarrhea  High blood pressure (1% of people) Muscle aches (1% of people)   If your COVID-19 symptoms get worse, get medical help right away. Call 911 if you experience symptoms such as worsening cough, trouble breathing, chest pain that doesn't go away, confusion, a hard time staying awake, and pale or blue-colored skin. This medication won't prevent all COVID-19 cases from getting worse.

## 2021-03-09 NOTE — Progress Notes (Signed)
Virtual Visit Progress Note  [facility]pt[facility] Management [facility]  Telephone:([phone] [phone]  I connected with Cassandra [name] on 03/09/21 at 11:30 AM EDT by video enabled telemedicine visit and verified that I am speaking with the correct person using two identifiers.   I discussed the limitations, risks, security and privacy concerns of performing an evaluation and management service by telemedicine and the availability of in-person appointments. I also discussed with the patient that there may be a patient responsible charge related to this service. The patient expressed understanding and agreed to proceed.   Other persons participating in the visit and their role in the encounter: none   Patient's location: home  Provider's location: clinic   Chief Complaint: Covid Positive    Patient Care Team: Beryle Flock, Marzella Schlein, MD as PCP - General (Family Medicine) Flinchum, Eula Fried, FNP (Family Medicine) Benita Gutter, RN as Oncology Nurse Navigator [name], MD as Consulting Physician (Oncology)   Name of the patient: Cassandra [name]  [id number]  May 02, 1956   Date of visit: 03/09/21  Diagnosis- Pancreatic Cancer   Chief complaint/ Reason for visit- Covid Positive  Heme/Onc history:  Oncology History  Primary pancreatic cancer (HCC)  02/29/2020 Initial Diagnosis   Primary pancreatic cancer  (HCC)   02/29/2020 Cancer Staging   Staging form: Exocrine Pancreas, AJCC 8th Edition - Clinical stage from 02/29/2020: Stage IV (cT2, cN0, cM1) - Signed by [name], MD on 02/29/2020   03/11/2020 -  Chemotherapy    Patient is on Treatment Plan: PANCREAS MODIFIED FOLFIRINOX Q14D X 12 CYCLES        Genetic Testing   Negative genetic testing. No pathogenic variants identified on the [facility] Multi-Cancer Panel. VUS in EGFR called c.1715C>G and VUS in NBN called c.1551C>A identified. The report date is 04/08/2020.  The Multi-Cancer Panel offered by [facility]  includes sequencing and/or deletion duplication testing of the following 85 genes: AIP, ALK, APC, ATM, AXIN2,BAP1,  BARD1, BLM, BMPR1A, BRCA1, BRCA2, BRIP1, CASR, CDC73, CDH1, CDK4, CDKN1B, CDKN1C, CDKN2A (p14ARF), CDKN2A (p16INK4a), CEBPA, CHEK2, CTNNA1, DICER1, DIS3L2, EGFR (c.2369C>T, p.Thr790Met variant only), EPCAM (Deletion/duplication testing only), FH, FLCN, GATA2, GPC3, GREM1 (Promoter region deletion/duplication testing only), HOXB13 (c.251G>A, p.Gly84Glu), HRAS, KIT, MAX, MEN1, MET, MITF (c.952G>A, p.Glu318Lys variant only), MLH1, MSH2, MSH3, MSH6, MUTYH, NBN, NF1, NF2, NTHL1, PALB2, PDGFRA, PHOX2B, PMS2, POLD1, POLE, POT1, PRKAR1A, PTCH1, PTEN, RAD50, RAD51C, RAD51D, RB1, RECQL4, RET, RNF43, RUNX1, SDHAF2, SDHA (sequence changes only), SDHB, SDHC, SDHD, SMAD4, SMARCA4, SMARCB1, SMARCE1, STK11, SUFU, TERC, TERT, TMEM127, TP53, TSC1, TSC2, VHL, WRN and WT1.      Interval history- Patient is 65 year old female who presents to clinic for covid infection. Tested positive on Friday. Developed runny nose, sneezing, aches, no fever. Taken tylenol with improvement in symptoms. Mild fatigue. Today is third day of symptoms. She has sore throat and congestion. Last booster was a year ago.   ECOG FS:2 - Symptomatic, <50% confined to bed  Review of systems- Review of Systems  Constitutional:  Positive for malaise/fatigue. Negative for chills, diaphoresis, fever and weight loss.  HENT:  Positive for congestion and sore throat. Negative for ear discharge, ear pain, nosebleeds and sinus pain.   Eyes:  Negative for pain and redness.  Respiratory:  Positive for sputum production. Negative for cough, hemoptysis, shortness of breath, wheezing and stridor.   Cardiovascular:  Negative for chest pain, palpitations and leg swelling.  Gastrointestinal:  Negative for abdominal pain, constipation, diarrhea, nausea and vomiting.  Genitourinary:  Negative for dysuria and flank pain.  Musculoskeletal:  Negative for  falls, joint pain and myalgias.  Skin:  Negative for rash.  Neurological:  Negative for dizziness, loss of consciousness, weakness and headaches.  Psychiatric/Behavioral:  Negative for depression. The patient is not nervous/anxious and does not have insomnia.   All other systems reviewed and are negative.   Current treatment- modified folfirinox   Allergies  Allergen Reactions   Penicillin G Hives and Swelling    Rxn as a child    Past Medical History:  Diagnosis Date   Cancer ([place])    pancreatic cancer   Colon polyps    Family history of breast cancer    Neutropenia ([place]) 04/18/2020   Osteopenia after menopause 05/2017   femoral neck T score -2.0   Personal history of chemotherapy    current for pancreatic ca    Past Surgical History:  Procedure Laterality Date   COLONOSCOPY  07/2015   WNL   COLONOSCOPY  2008/2011   OVARIAN CYST REMOVAL  1992   dermoid-Dr CAK   PORTA CATH INSERTION N/A 03/07/2020   Procedure: PORTA CATH INSERTION;  Surgeon: Algernon Huxley, MD;  Location: [place] [facility];  Service: Cardiovascular;  Laterality: N/A;   TEE WITHOUT CARDIOVERSION N/A 03/21/2020   Procedure: TRANSESOPHAGEAL ECHOCARDIOGRAM (TEE);  Surgeon: Dionisio David, MD;  Location: [facility] ORS;  Service: Cardiovascular;  Laterality: N/A;   TUBAL LIGATION  1993    Social History   Socioeconomic History   Marital status: Married    Spouse name: Not on file   Number of children: 2   Years of education: Not on file   Highest education level: Not on file  Occupational History   Occupation: Pharmacist, hospital    Comment: retired   Occupation: Psychologist, sport and exercise  Tobacco Use   Smoking status: Former    Packs/day: 0.50    Years: 10.00    Pack years: 5.00    Types: Cigarettes    Quit date: 1987    Years since quitting: 35.7   Smokeless tobacco: Never   Tobacco comments:    Quit smoking 1987  Vaping Use   Vaping Use: Never used  Substance and Sexual Activity   Alcohol use: Not Currently    Comment:  0-2 mixed drinks a day   Drug use: No   Sexual activity: Not Currently    Birth control/protection: Post-menopausal  Other Topics Concern   Not on file  Social History Narrative   Not on file   Social Determinants of Health   Financial Resource Strain: Not on file  Food Insecurity: Not on file  Transportation Needs: Not on file  Physical Activity: Not on file  Stress: Not on file  Social Connections: Not on file  Intimate Partner Violence: Not on file    Family History  Problem Relation Age of Onset   Breast cancer Paternal Aunt 27   Diabetes Mother    Osteoporosis Mother    Hyperlipidemia Father    Rheumatic fever Father    Valvular heart disease Father    Cancer Paternal Grandmother        possible colon   Breast cancer Sister 68     Current Outpatient Medications:    Calcium Carbonate-Vit D-Min (CALCIUM 1200 PO), Take by mouth., Disp: , Rfl:    Cholecalciferol 25 MCG (1000 UT) tablet, Take 2,000 Units by mouth daily., Disp: , Rfl:    Cyanocobalamin (VITAMIN B 12 PO), Take by mouth., Disp: ,  Rfl:    lidocaine-prilocaine (EMLA) cream, Apply to affected area once (Patient taking differently: Apply 1 application topically as directed.), Disp: 30 g, Rfl: 3   loperamide (IMODIUM A-D) 2 MG tablet, Take 2 at onset of diarrhea, then 1 every 2hrs until 12hr without a BM. May take 2 tab every 4hrs at bedtime. If diarrhea recurs repeat. (Patient not taking: No sig reported), Disp: 100 tablet, Rfl: 1   magic mouthwash w/lidocaine SOLN, Take 5 mLs by mouth 4 (four) times daily as needed for mouth pain. Sig: Swish & Spit 5-10 ml four times a day as needed. Dispense 480 ml. 1RF (Patient not taking: No sig reported), Disp: 480 mL, Rfl: 1   Multiple Vitamins-Minerals (MULTIVITAMIN WITH MINERALS) tablet, Take 1 tablet by mouth daily., Disp: , Rfl:    ondansetron (ZOFRAN) 8 MG tablet, Take 1 tablet (8 mg total) by mouth 2 (two) times daily as needed. Start on day 3 after chemotherapy., Disp:  30 tablet, Rfl: 1   Pancrelipase, Lip-Prot-Amyl, (CREON) 24000-76000 units CPEP, Take 1 capsule (24,000 Units total) by mouth 3 (three) times daily before meals., Disp: 90 capsule, Rfl: 3   potassium chloride SA (KLOR-CON M20) 20 MEQ tablet, Take 1 tablet (20 mEq total) by mouth daily. (Patient taking differently: Take 20 mEq by mouth daily. QOD), Disp: 30 tablet, Rfl: 0   prochlorperazine (COMPAZINE) 10 MG tablet, Take 1 tablet (10 mg total) by mouth every 6 (six) hours as needed (Nausea or vomiting)., Disp: 30 tablet, Rfl: 1   simethicone (GAS-X) 80 MG chewable tablet, Chew 1 tablet (80 mg total) by mouth every 8 (eight) hours as needed for flatulence., Disp: 60 tablet, Rfl: 0 No current facility-administered medications for this visit.  Facility-Administered Medications Ordered in Other Visits:    sodium chloride flush (NS) 0.9 % injection 10 mL, 10 mL, Intravenous, PRN, [name], MD, 10 mL at 02/18/21 1610  Physical exam: Exam limited due to telemedicine  There were no vitals filed for this visit. Physical Exam Constitutional:      General: She is not in acute distress. HENT:     Head: Normocephalic.     Nose: Congestion present.  Pulmonary:     Effort: No respiratory distress.  Neurological:     Mental Status: She is alert and oriented to person, place, and time.  Psychiatric:        Mood and Affect: Mood normal.        Behavior: Behavior normal.     CMP Latest Ref Rng & Units 02/18/2021  Glucose 70 - 99 mg/dL 75  BUN 8 - 23 mg/dL 18  Creatinine 0.44 - 1.00 mg/dL 0.68  Sodium 135 - 145 mmol/L 139  Potassium 3.5 - 5.1 mmol/L 4.1  Chloride 98 - 111 mmol/L 105  CO2 22 - 32 mmol/L 28  Calcium 8.9 - 10.3 mg/dL 8.8(L)  Total Protein 6.5 - 8.1 g/dL 6.6  Total Bilirubin 0.3 - 1.2 mg/dL 0.4  Alkaline Phos 38 - 126 U/L 89  AST 15 - 41 U/L 31  ALT 0 - 44 U/L 31   CBC Latest Ref Rng & Units 02/18/2021  WBC 4.0 - 10.5 K/uL 4.1  Hemoglobin 12.0 - 15.0 g/dL 12.3  Hematocrit 36.0 -  46.0 % 35.6(L)  Platelets 150 - 400 K/uL 157    No images are attached to the encounter.  No results found.  Assessment and plan- Patient is a 65 y.o. female with pancreatic cancer who presents for covid infection  1) Covid 19 Infection - Keep yourself hydrated with a lot of water and rest.  -Take Delsym for cough and Mucinex to thin secretions - Take Tylenol or pain reliever every 4-6 hours as needed for pain/fever/body ache. Don't exceed 3000 mg in 24 hour period - Flonase or nasocort as needed. Afrin for nasal congestion no longer than 5 days.  - Reviewed symptoms that would warrant ER or hospital care.  - Encouraged patient to monitor oxygen level with pulse oximeter, how to troubleshoot, and to seek ER care for SpO2 < 94%.  - CDC criteria for quarantine and isolation reviewed.   - I recommend rest as needed during the acute illness and frequent repositioning ambulation is encouraged. In addition, we encourage all patients to advance activity as soon as tolerated during recovery. - In non-hospitalized patients, we do not treat COVID-19 with dexamethasone, prednisone, or other corticosteroids. Per the literature, extrapolating from the results of studies of hospitalized patients, there is no evidence that corticosteroids benefit patients without a supplemental oxygen requirement, and further, they may be associated with poorer clinical outcomes - There is not high-quality data that suggests that supplementation with vitamin C, vitamin D, or zinc reduces the severity of COVID-19 in non-hospitalized patients - For patients with documented COVID-19, we do not routinely administer empiric therapy for bacterial pneumonia. Data are limited, but bacterial superinfection does not appear to be a prominent feature of COVID-19 - Prescription for paxlovid sent to pharmacy. Lengthy discussion regarding risks, benefits, rationale, and alternatives.  - Appointments moved out x 3 weeks   Follow up with  medical oncology. I updated Dr. Tasia Catchings.    Visit Diagnosis 1. COVID-19 virus infection     Patient expressed understanding and was in agreement with this plan. She also understands that She can call clinic at any time with any questions, concerns, or complaints.   I discussed the assessment and treatment plan with the patient. The patient was provided an opportunity to ask questions and all were answered. The patient agreed with the plan and demonstrated an understanding of the instructions.   The patient was advised to call back or seek an in-person evaluation if the symptoms worsen or if the condition fails to improve as anticipated.   I spent 20 minutes face-to-face video visit time dedicated to the care of this patient on the date of this encounter to include pre-visit review of medications, labs, face-to-face time with the patient, and post visit ordering of testing/documentation.   Thank you for allowing me to participate in the care of this very pleasant patient.   Beckey Rutter, DNP, AGNP-C [facility]

## 2021-03-09 NOTE — Telephone Encounter (Signed)
[place], Will this pt be eligible for paxlovid?

## 2021-03-11 ENCOUNTER — Inpatient Hospital Stay: Payer: Medicare Other

## 2021-03-17 ENCOUNTER — Telehealth: Payer: Self-pay | Admitting: *Deleted

## 2021-03-17 NOTE — Telephone Encounter (Signed)
Patient called reporting that she is having intermittent mild abdominal pain. She also reports having taken the antiviral medicine for COVID 19 last week. She is asking if Dr Tasia Catchings wants to check labs on her. Please advise

## 2021-03-18 ENCOUNTER — Inpatient Hospital Stay (HOSPITAL_BASED_OUTPATIENT_CLINIC_OR_DEPARTMENT_OTHER): Payer: Medicare Other | Admitting: Hospice and Palliative Medicine

## 2021-03-18 ENCOUNTER — Encounter: Payer: Self-pay | Admitting: Oncology

## 2021-03-18 ENCOUNTER — Inpatient Hospital Stay: Payer: Medicare Other

## 2021-03-18 ENCOUNTER — Other Ambulatory Visit: Payer: Self-pay

## 2021-03-18 VITALS — BP 130/85 | HR 76 | Temp 99.0°F | Resp 16

## 2021-03-18 DIAGNOSIS — R7401 Elevation of levels of liver transaminase levels: Secondary | ICD-10-CM | POA: Diagnosis not present

## 2021-03-18 DIAGNOSIS — Z95828 Presence of other vascular implants and grafts: Secondary | ICD-10-CM

## 2021-03-18 DIAGNOSIS — Z87891 Personal history of nicotine dependence: Secondary | ICD-10-CM | POA: Diagnosis not present

## 2021-03-18 DIAGNOSIS — C259 Malignant neoplasm of pancreas, unspecified: Secondary | ICD-10-CM

## 2021-03-18 DIAGNOSIS — G620 Drug-induced polyneuropathy: Secondary | ICD-10-CM | POA: Diagnosis not present

## 2021-03-18 DIAGNOSIS — K8689 Other specified diseases of pancreas: Secondary | ICD-10-CM | POA: Diagnosis not present

## 2021-03-18 DIAGNOSIS — C787 Secondary malignant neoplasm of liver and intrahepatic bile duct: Secondary | ICD-10-CM | POA: Diagnosis present

## 2021-03-18 DIAGNOSIS — C250 Malignant neoplasm of head of pancreas: Secondary | ICD-10-CM | POA: Diagnosis not present

## 2021-03-18 DIAGNOSIS — T451X5A Adverse effect of antineoplastic and immunosuppressive drugs, initial encounter: Secondary | ICD-10-CM | POA: Diagnosis not present

## 2021-03-18 DIAGNOSIS — Z8616 Personal history of COVID-19: Secondary | ICD-10-CM | POA: Diagnosis not present

## 2021-03-18 LAB — CBC WITH DIFFERENTIAL/PLATELET
Abs Immature Granulocytes: 0.03 10*3/uL (ref 0.00–0.07)
Basophils Absolute: 0 10*3/uL (ref 0.0–0.1)
Basophils Relative: 0 %
Eosinophils Absolute: 0.1 10*3/uL (ref 0.0–0.5)
Eosinophils Relative: 1 %
HCT: 35 % — ABNORMAL LOW (ref 36.0–46.0)
Hemoglobin: 12.2 g/dL (ref 12.0–15.0)
Immature Granulocytes: 0 %
Lymphocytes Relative: 15 %
Lymphs Abs: 1.2 10*3/uL (ref 0.7–4.0)
MCH: 33.7 pg (ref 26.0–34.0)
MCHC: 34.9 g/dL (ref 30.0–36.0)
MCV: 96.7 fL (ref 80.0–100.0)
Monocytes Absolute: 1.1 10*3/uL — ABNORMAL HIGH (ref 0.1–1.0)
Monocytes Relative: 14 %
Neutro Abs: 5.5 10*3/uL (ref 1.7–7.7)
Neutrophils Relative %: 70 %
Platelets: 192 10*3/uL (ref 150–400)
RBC: 3.62 MIL/uL — ABNORMAL LOW (ref 3.87–5.11)
RDW: 12.4 % (ref 11.5–15.5)
WBC: 7.9 10*3/uL (ref 4.0–10.5)
nRBC: 0 % (ref 0.0–0.2)

## 2021-03-18 LAB — COMPREHENSIVE METABOLIC PANEL
ALT: 479 U/L — ABNORMAL HIGH (ref 0–44)
AST: 334 U/L — ABNORMAL HIGH (ref 15–41)
Albumin: 3.7 g/dL (ref 3.5–5.0)
Alkaline Phosphatase: 501 U/L — ABNORMAL HIGH (ref 38–126)
Anion gap: 6 (ref 5–15)
BUN: 15 mg/dL (ref 8–23)
CO2: 28 mmol/L (ref 22–32)
Calcium: 8.9 mg/dL (ref 8.9–10.3)
Chloride: 101 mmol/L (ref 98–111)
Creatinine, Ser: 0.56 mg/dL (ref 0.44–1.00)
GFR, Estimated: >60 mL/min (ref >60)
Glucose, Bld: 116 mg/dL — ABNORMAL HIGH (ref 70–99)
Potassium: 4 mmol/L (ref 3.5–5.1)
Sodium: 135 mmol/L (ref 135–145)
Total Bilirubin: 1 mg/dL (ref 0.3–1.2)
Total Protein: 6.6 g/dL (ref 6.5–8.1)

## 2021-03-18 MED ORDER — SODIUM CHLORIDE 0.9% FLUSH
10.0000 mL | Freq: Once | INTRAVENOUS | Status: AC
  Filled 2021-03-18: qty 10

## 2021-03-18 MED ORDER — HEPARIN SOD (PORK) LOCK FLUSH 100 UNIT/ML IV SOLN
500.0000 [IU] | Freq: Once | INTRAVENOUS | Status: AC
Start: 2021-03-18 — End: 2021-03-18
  Administered 2021-03-18: 500 [IU] via INTRAVENOUS
  Filled 2021-03-18: qty 5

## 2021-03-18 NOTE — Addendum Note (Signed)
Addended by: Jonnie Finner D on: 03/18/2021 03:01 PM   Modules accepted: Orders

## 2021-03-18 NOTE — Progress Notes (Signed)
Symptom Management [place]  [phone] [phone]  Patient Care Team: [name], MD as PCP - General (Family Medicine) Flinchum, Kelby Aline, FNP (Family Medicine) [name], RN as Oncology Nurse Navigator [name], MD as Consulting Physician (Oncology)   Name of the patient: Cassandra [name]  [id number]  01-Mar-1956   Date of visit: 03/18/21  Reason for Consult:  Ms. 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  Patient's last abdominal CT on 01/14/2021 revealed unchanged soft tissue fullness in the pancreatic head, redemonstrated common bile duct stent, and no evidence of lymphadenopathy or metastatic disease in the chest or abdomen. No liver abnormalities.   Patient last saw Dr. Tasia Catchings on 02/18/2021 at which time she seemed to be in her usual state of health.  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  Last FOLFIRI was on 02/18/2021.  Patient had a virtual visit with Beckey Rutter, NP on 03/09/2021 after testing positive for COVID.  Patient was started on Paxlovid for 5 days.   Patient [facility] for complaints of abdominal "discomfort".  Patient reports that she started developing intermittent abdominal discomfort/pain after completing paxlovid around 9/16.  She has difficulty characterizing the discomfort he says it feels somewhat like gas pain, generalized in no particular location. She has been taking GasX. She denies sharp or stabbing abdominal pain.  Last pain was yesterday and she says she has none today.  She denies any nausea or vomiting.  No fever or chills.  No shortness of breath or chest pain.  No urinary urgency, frequency, or burning.  No constipation or diarrhea.   She is having regular bowel movements. No melena or hematochezia.  No jaundice noted.  Denies any neurologic complaints. Denies recent fevers or illnesses. Denies any easy bleeding or bruising. Reports good appetite and denies weight loss. Denies chest pain. Denies any nausea, vomiting, constipation, or diarrhea. Denies urinary complaints. Patient offers no further specific complaints today.  PAST MEDICAL HISTORY: Past Medical History:  Diagnosis Date   Cancer [facility])    pancreatic cancer   Colon polyps    Family history of breast cancer    Neutropenia ([place]) 04/18/2020   Osteopenia after menopause 05/2017   femoral neck T score -2.0   Personal history of chemotherapy    current for pancreatic ca    PAST SURGICAL HISTORY:  Past Surgical History:  Procedure Laterality Date   COLONOSCOPY  07/2015   WNL   COLONOSCOPY  2008/2011   OVARIAN CYST REMOVAL  1992   dermoid-Dr CAK   PORTA CATH INSERTION N/A 03/07/2020   Procedure: PORTA CATH INSERTION;  Surgeon: Algernon Huxley, MD;  Location: [place] CV LAB;  Service: Cardiovascular;  Laterality: N/A;   TEE WITHOUT CARDIOVERSION N/A 03/21/2020   Procedure: TRANSESOPHAGEAL ECHOCARDIOGRAM (TEE);  Surgeon: Dionisio David, MD;  Location: [facility] ORS;  Service: Cardiovascular;  Laterality: N/A;   TUBAL LIGATION  1993    HEMATOLOGY/ONCOLOGY HISTORY:  Oncology History  Primary pancreatic cancer ([place])  02/29/2020 Initial Diagnosis   Primary pancreatic cancer  ([place])   02/29/2020 Cancer Staging   Staging form: Exocrine Pancreas, AJCC 8th Edition - Clinical stage from 02/29/2020: Stage IV (cT2, cN0, cM1) - Signed by [name], MD on 02/29/2020   03/11/2020 -  Chemotherapy    Patient is on Treatment Plan: PANCREAS MODIFIED FOLFIRINOX Q14D X 12 CYCLES        Genetic Testing   Negative genetic testing. No pathogenic variants identified on the [facility] Multi-Cancer Panel. VUS in EGFR called c.1715C>G and VUS in NBN called c.1551C>A identified. The report  date is 04/08/2020.  The Multi-Cancer Panel offered by [facility] includes sequencing and/or deletion duplication testing of the following 85 genes: AIP, ALK, APC, ATM, AXIN2,BAP1,  BARD1, BLM, BMPR1A, BRCA1, BRCA2, BRIP1, CASR, CDC73, CDH1, CDK4, CDKN1B, CDKN1C, CDKN2A (p14ARF), CDKN2A (p16INK4a), CEBPA, CHEK2, CTNNA1, DICER1, DIS3L2, EGFR (c.2369C>T, p.Thr790Met variant only), EPCAM (Deletion/duplication testing only), FH, FLCN, GATA2, GPC3, GREM1 (Promoter region deletion/duplication testing only), HOXB13 (c.251G>A, p.Gly84Glu), HRAS, KIT, MAX, MEN1, MET, MITF (c.952G>A, p.Glu318Lys variant only), MLH1, MSH2, MSH3, MSH6, MUTYH, NBN, NF1, NF2, NTHL1, PALB2, PDGFRA, PHOX2B, PMS2, POLD1, POLE, POT1, PRKAR1A, PTCH1, PTEN, RAD50, RAD51C, RAD51D, RB1, RECQL4, RET, RNF43, RUNX1, SDHAF2, SDHA (sequence changes only), SDHB, SDHC, SDHD, SMAD4, SMARCA4, SMARCB1, SMARCE1, STK11, SUFU, TERC, TERT, TMEM127, TP53, TSC1, TSC2, VHL, WRN and WT1.      ALLERGIES:  is allergic to penicillin g.  MEDICATIONS:  Current Outpatient Medications  Medication Sig Dispense Refill   Calcium Carbonate-Vit D-Min (CALCIUM 1200 PO) Take by mouth.     Cholecalciferol 25 MCG (1000 UT) tablet Take 2,000 Units by mouth daily.     Cyanocobalamin (VITAMIN B 12 PO) Take by mouth.     lidocaine-prilocaine (EMLA) cream Apply to affected area once (Patient taking differently: Apply 1 application topically as directed.) 30 g 3   loperamide (IMODIUM A-D) 2 MG tablet Take 2 at onset of diarrhea, then 1 every 2hrs until 12hr without a BM. May take 2 tab every 4hrs at bedtime. If diarrhea recurs repeat. (Patient not taking: No sig reported) 100 tablet 1   magic mouthwash w/lidocaine SOLN Take 5 mLs by mouth 4 (four) times daily as needed for mouth pain. Sig: Swish & Spit 5-10 ml four times a day as needed. Dispense 480 ml. 1RF (Patient not taking: No sig reported) 480 mL 1   Multiple Vitamins-Minerals (MULTIVITAMIN WITH MINERALS) tablet Take 1  tablet by mouth daily.     ondansetron (ZOFRAN) 8 MG tablet Take 1 tablet (8 mg total) by mouth 2 (two) times daily as needed. Start on day 3 after chemotherapy. 30 tablet 1   Pancrelipase, Lip-Prot-Amyl, (CREON) 24000-76000 units CPEP Take 1 capsule (24,000 Units total) by mouth 3 (three) times daily before meals. 90 capsule 3   potassium chloride SA (KLOR-CON M20) 20 MEQ tablet Take 1 tablet (20 mEq total) by mouth daily. (Patient taking differently: Take 20 mEq by mouth daily. QOD) 30 tablet 0   prochlorperazine (COMPAZINE) 10 MG tablet Take 1 tablet (10 mg total) by mouth every 6 (six) hours as needed (Nausea or vomiting). 30 tablet 1   simethicone (GAS-X) 80 MG chewable tablet Chew 1 tablet (80 mg total) by mouth every 8 (eight) hours as needed for flatulence. 60 tablet 0   No current facility-administered medications for this visit.   Facility-Administered Medications Ordered in Other Visits  Medication Dose Route Frequency Provider Last Rate Last Admin   sodium chloride flush (NS) 0.9 % injection 10 mL  10 mL Intravenous PRN [name], MD   10 mL at 02/18/21 0858    VITAL SIGNS: There were no vitals taken for this visit. There were no vitals filed for this visit.  Estimated body mass index is 20.2 kg/m  as calculated from the following:   Height as of 01/20/21: _0  (1.702 m).   Weight as of 02/18/21: 129 lb (58.5 kg).  LABS: CBC:    Component Value Date/Time   WBC 4.1 02/18/2021 0845   HGB 12.3 02/18/2021 0845   HGB 13.9 02/06/2020 1049   HCT 35.6 (L) 02/18/2021 0845   HCT 41.7 02/06/2020 1049   PLT 157 02/18/2021 0845   PLT 221 02/06/2020 1049   MCV 99.7 02/18/2021 0845   MCV 94 02/06/2020 1049   NEUTROABS 2.2 02/18/2021 0845   NEUTROABS 3.4 02/06/2020 1049   LYMPHSABS 1.3 02/18/2021 0845   LYMPHSABS 1.4 02/06/2020 1049   MONOABS 0.5 02/18/2021 0845   EOSABS 0.1 02/18/2021 0845   EOSABS 0.1 02/06/2020 1049   BASOSABS 0.0 02/18/2021 0845   BASOSABS 0.0 02/06/2020 1049    Comprehensive Metabolic Panel:    Component Value Date/Time   NA 139 02/18/2021 0845   NA 137 02/06/2020 1049   K 4.1 02/18/2021 0845   CL 105 02/18/2021 0845   CO2 28 02/18/2021 0845   BUN 18 02/18/2021 0845   BUN 11 02/06/2020 1049   CREATININE 0.68 02/18/2021 0845   GLUCOSE 75 02/18/2021 0845   CALCIUM 8.8 (L) 02/18/2021 0845   AST 31 02/18/2021 0845   ALT 31 02/18/2021 0845   ALKPHOS 89 02/18/2021 0845   BILITOT 0.4 02/18/2021 0845   BILITOT 9.9 (H) 02/06/2020 1049   PROT 6.6 02/18/2021 0845   PROT 7.0 02/06/2020 1049   ALBUMIN 4.1 02/18/2021 0845   ALBUMIN 4.9 (H) 02/06/2020 1049    RADIOGRAPHIC STUDIES: No results found.  PERFORMANCE STATUS (ECOG) : 1 - Symptomatic but completely ambulatory  Review of Systems Unless otherwise noted, a complete review of systems is negative.  Physical Exam General: NAD Cardiovascular: regular rate and rhythm Pulmonary: clear ant/post fields Abdomen: soft, nontender, + bowel sounds, no masses or organomegaly GU: no suprapubic tenderness Extremities: no edema, no joint deformities Skin: no rashes, no jaundice Neurological: Grossly nonfocal  Assessment and Plan- Patient is a 65 y.o. 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    Transaminitis -unclear etiology.  This could be secondary to residual effects of COVID versus paxlovid.  However, cancer progression could also be a possibility but seems less likely given stable CT in July and complete resolution of her previous liver metastasis.  Abdominal exam is completely benign and patient is currently asymptomatic.  Discussed with Dr. Tasia Catchings and will obtain complete abdominal ultrasound and repeat LFTs on Friday.  Patient is scheduled for abdominal CT/MRI at [facility] on  Monday and then will see Dr. Tasia Catchings for repeat labs/MD visit on Wednesday of next week.  Discussed ER triggers with patient.   Patient expressed understanding and was in agreement with this plan. She also understands that She can call clinic at any time with any questions, concerns, or complaints.   Thank you for allowing me to participate in the care of this very pleasant patient.   Time Total: 30 minutes  Visit consisted of counseling and education dealing with the complex and emotionally intense issues of symptom management and palliative care in the setting of serious and potentially life-threatening illness.Greater than 50%  of this time was spent counseling and coordinating care related to the above assessment and plan.  Signed  by: Altha Harm, PhD, NP-C

## 2021-03-18 NOTE — Progress Notes (Signed)
Pt reports generalized abdominal pain since starting Paxlovid. She completed paxlovid on 9/16, but pain persisted, although intermittent. She states that she actually does not have any pain today. States that pain level got up to a 5/10 at night, but generally around 3/10. Denies nausea, vomiting, or diarrhea.

## 2021-03-18 NOTE — Telephone Encounter (Addendum)
Attempted to contact pt to follow up on symptoms and if symptoms still persist to let her know that Dr. Tasia Catchings advises for her to be evalutaed in [facility] for abd pain. Pt did not answer,but Mychart message sent to see if she agreeable to coming in.

## 2021-03-18 NOTE — Telephone Encounter (Signed)
Patient called back and agrees to come in to [facility] and have labs. She will be here at 145

## 2021-03-19 ENCOUNTER — Other Ambulatory Visit: Payer: Self-pay

## 2021-03-19 ENCOUNTER — Ambulatory Visit
Admission: RE | Admit: 2021-03-19 | Discharge: 2021-03-19 | Disposition: A | Discharge status: Home / Self Care | Payer: Medicare Other | Source: Ambulatory Visit | Attending: Hospice and Palliative Medicine | Admitting: Hospice and Palliative Medicine

## 2021-03-19 DIAGNOSIS — R7401 Elevation of levels of liver transaminase levels: Secondary | ICD-10-CM | POA: Insufficient documentation

## 2021-03-19 IMAGING — US US ABDOMEN COMPLETE
1 series · 13 of 25 positions shown · non-contrast
Comparison: MRI [DATE]

CLINICAL DATA: Abdominal pain and transaminitis. History of
pancreas cancer.

EXAM:
ABDOMEN ULTRASOUND COMPLETE

[Series 1: us abdomen complete · 13 of 129 slices shown]
[im 1/129]
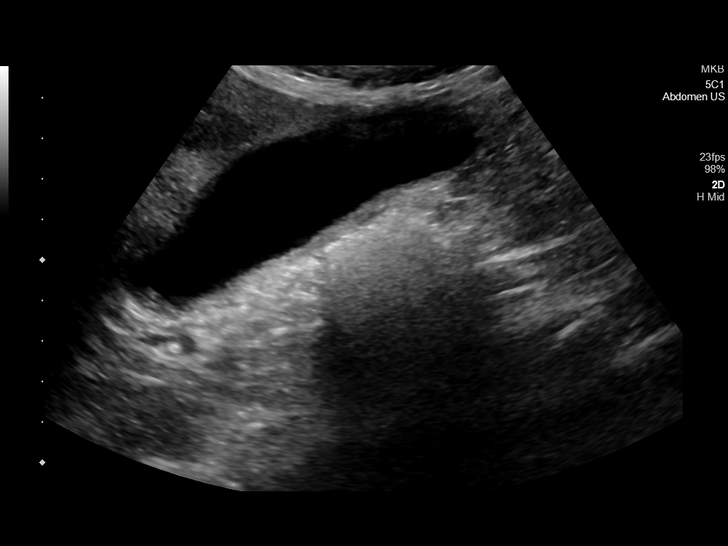
[im 11/129]
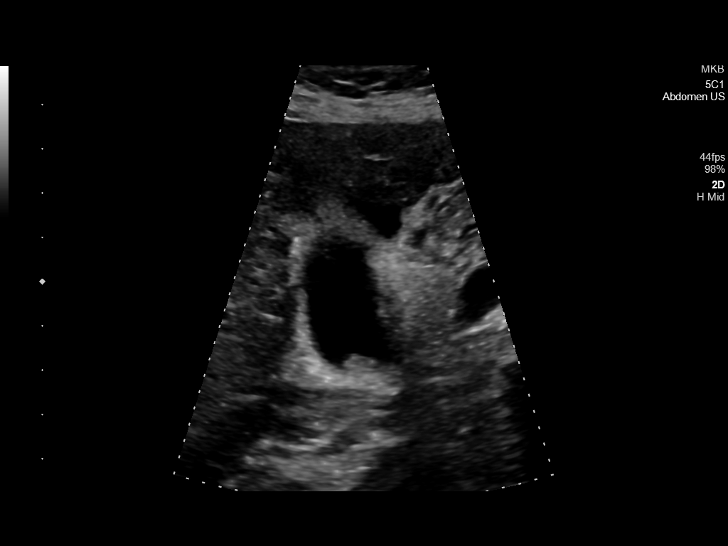
[im 22/129]
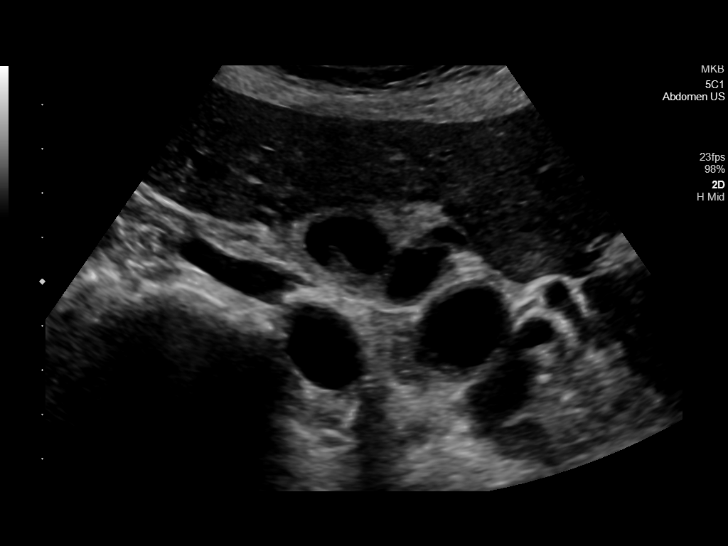
[im 33/129]
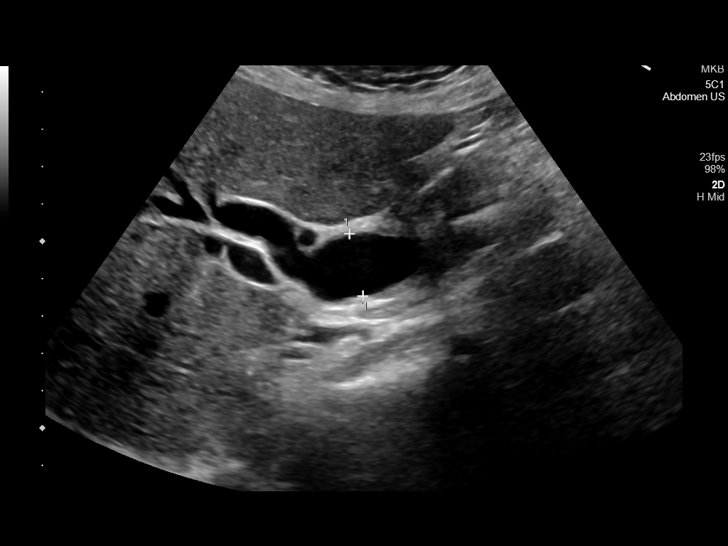
[im 43/129]
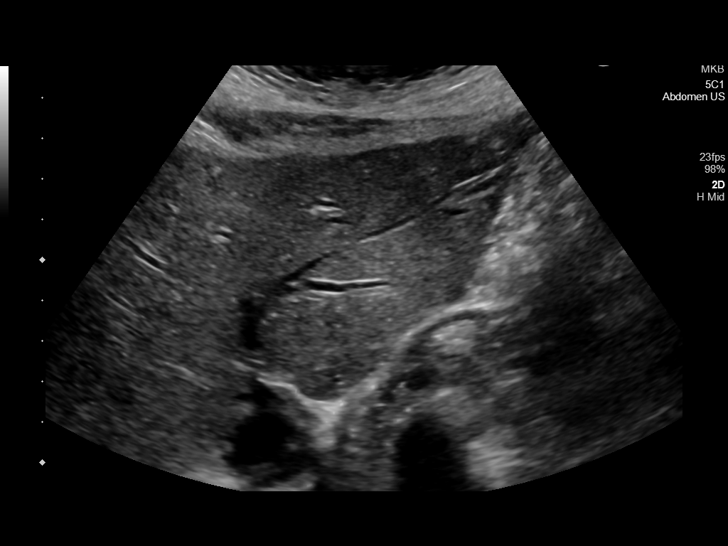
[im 54/129]
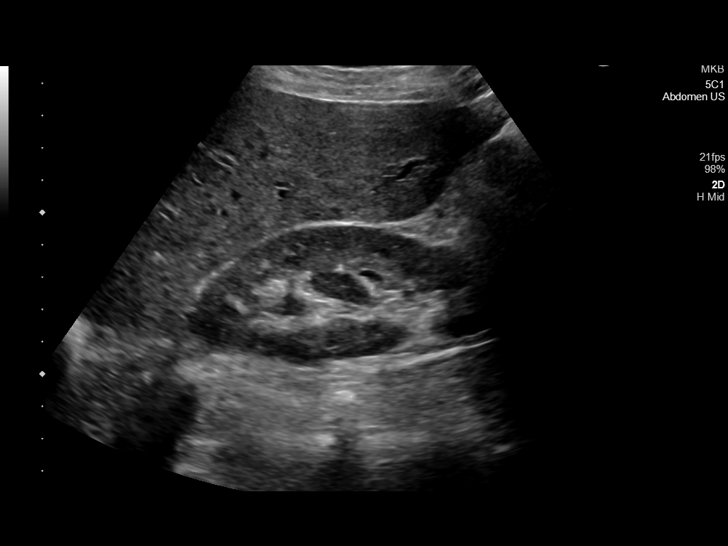
[im 65/129]
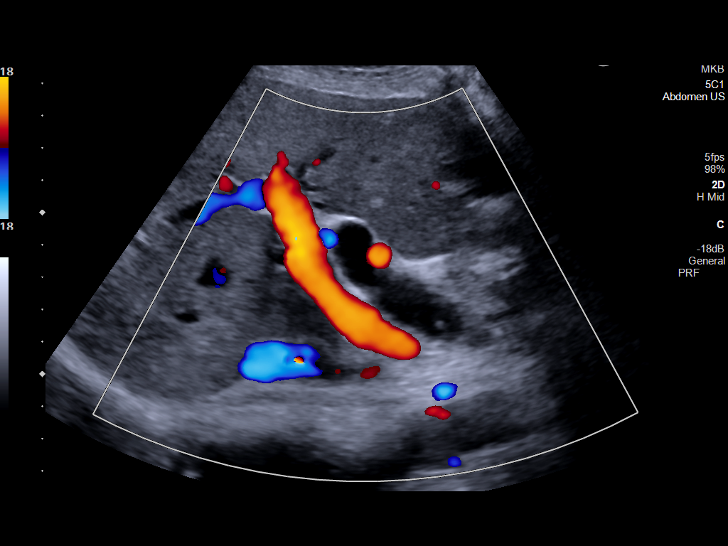
[im 75/129]
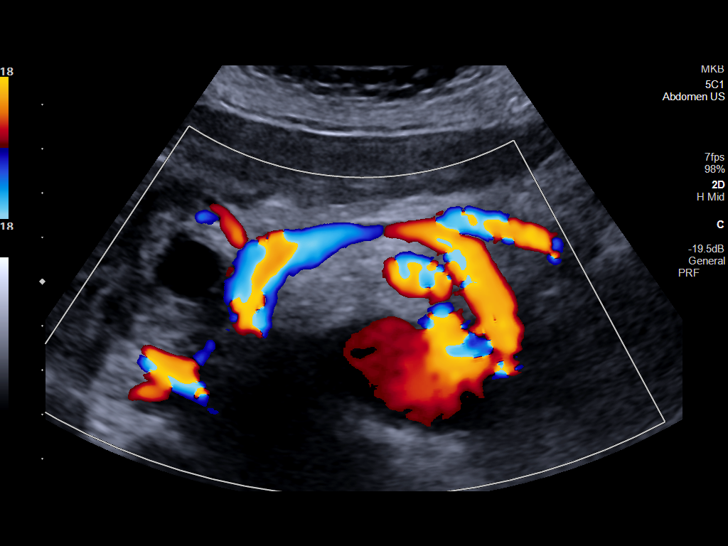
[im 86/129]
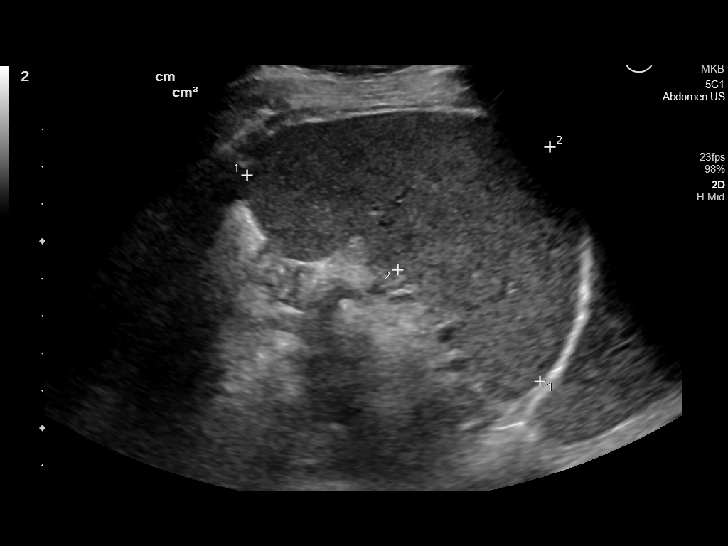
[im 97/129]
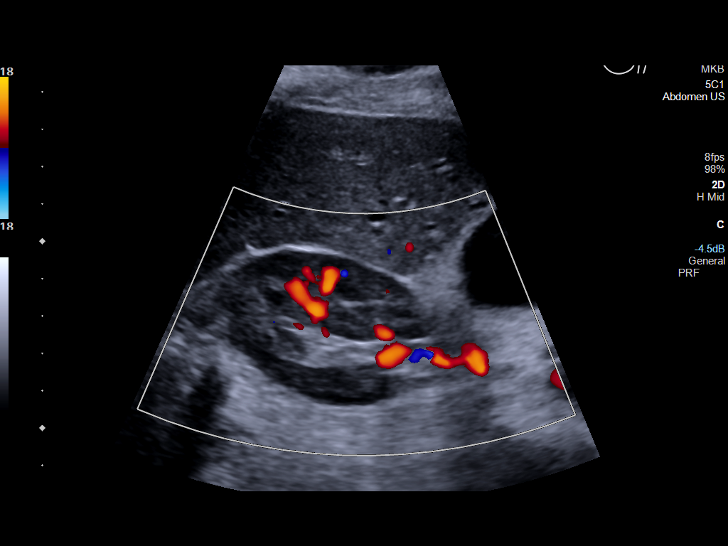
[im 107/129]
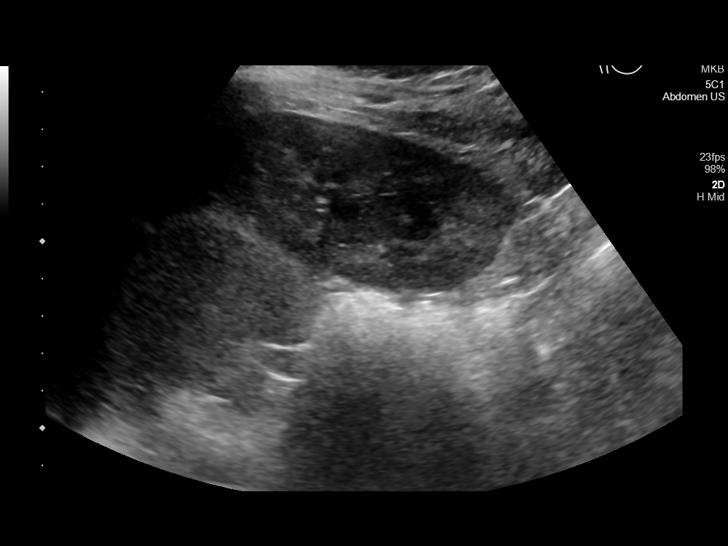
[im 118/129]
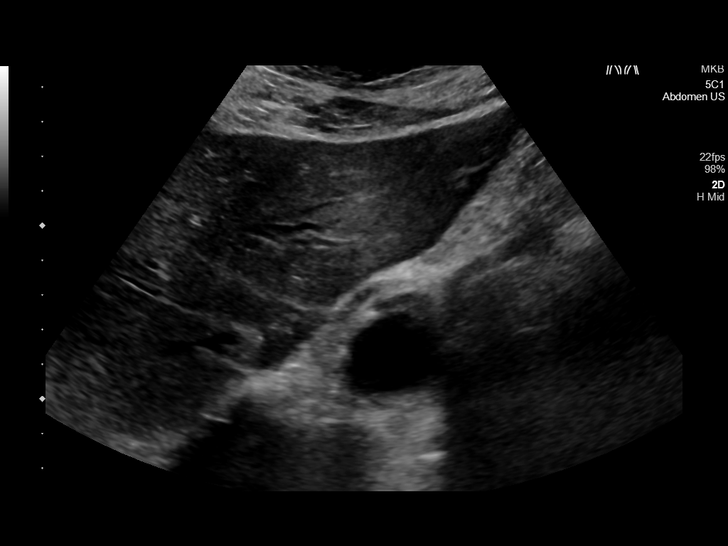
[im 129/129]
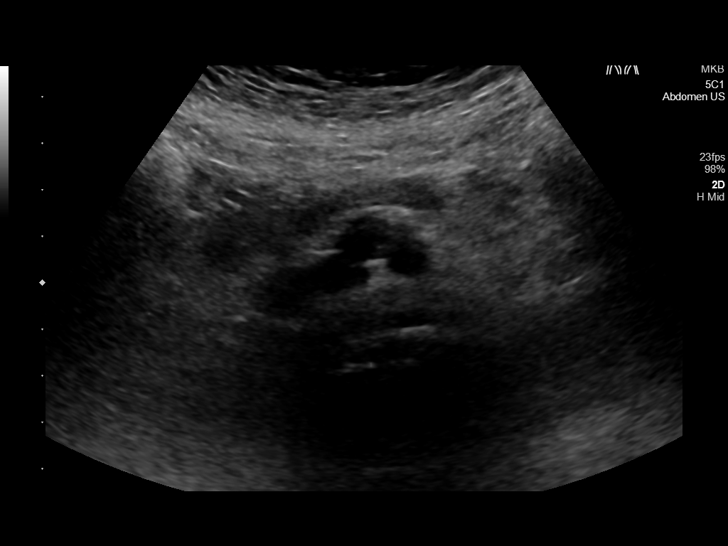

[13 of 25 positions shown; findings below may reference images not displayed]

FINDINGS: Gallbladder: Gallbladder sludge noted. No gallstones or
pericholecystic fluid. Mild diffuse gallbladder wall thickening
measuring between 3 and 5 mm. Negative sonographic Murphy's sign.

Common bile duct: Diameter: Measures 8.5 mm proximally and up to 17
mm near the head of pancreas. Known common bile duct stent is
difficult to visualize.

Liver: Intrahepatic bile duct dilatation. Normal parenchymal
echogenicity. No mass. Portal vein is patent on color Doppler
imaging with normal direction of blood flow towards the liver.

IVC: No abnormality visualized.

Pancreas: The head of the pancreas is prominent compatible with the
history of known pancreatic adenocarcinoma.

Spleen: Size and appearance within normal limits.

Right Kidney: Length: 10.3 cm. Echogenicity within normal limits. No
mass or hydronephrosis visualized.

Left Kidney: Length: 10.0 cm. Echogenicity within normal limits. No
mass or hydronephrosis visualized.

Abdominal aorta: No aneurysm visualized.

Other findings: None.
IMPRESSION: 1. Intrahepatic and extrahepatic bile duct dilatation. This is
similar to the CT from [DATE]. Known common bile duct stent is
difficult to visualize.
2. Gallbladder sludge with mild diffuse gallbladder wall thickening.
No gallstones, pericholecystic fluid, or sonographic Murphy's sign.

## 2021-03-20 ENCOUNTER — Inpatient Hospital Stay: Payer: Medicare Other

## 2021-03-20 DIAGNOSIS — C259 Malignant neoplasm of pancreas, unspecified: Secondary | ICD-10-CM

## 2021-03-20 DIAGNOSIS — C250 Malignant neoplasm of head of pancreas: Secondary | ICD-10-CM | POA: Diagnosis not present

## 2021-03-20 LAB — COMPREHENSIVE METABOLIC PANEL
ALT: 203 U/L — ABNORMAL HIGH (ref 0–44)
AST: 50 U/L — ABNORMAL HIGH (ref 15–41)
Albumin: 3.8 g/dL (ref 3.5–5.0)
Alkaline Phosphatase: 383 U/L — ABNORMAL HIGH (ref 38–126)
Anion gap: 8 (ref 5–15)
BUN: 16 mg/dL (ref 8–23)
CO2: 28 mmol/L (ref 22–32)
Calcium: 8.9 mg/dL (ref 8.9–10.3)
Chloride: 100 mmol/L (ref 98–111)
Creatinine, Ser: 0.63 mg/dL (ref 0.44–1.00)
GFR, Estimated: >60 mL/min (ref >60)
Glucose, Bld: 102 mg/dL — ABNORMAL HIGH (ref 70–99)
Potassium: 3.9 mmol/L (ref 3.5–5.1)
Sodium: 136 mmol/L (ref 135–145)
Total Bilirubin: 0.5 mg/dL (ref 0.3–1.2)
Total Protein: 6.8 g/dL (ref 6.5–8.1)

## 2021-03-20 LAB — CBC WITH DIFFERENTIAL/PLATELET
Abs Immature Granulocytes: 0.02 10*3/uL (ref 0.00–0.07)
Basophils Absolute: 0 10*3/uL (ref 0.0–0.1)
Basophils Relative: 1 %
Eosinophils Absolute: 0.1 10*3/uL (ref 0.0–0.5)
Eosinophils Relative: 2 %
HCT: 33.7 % — ABNORMAL LOW (ref 36.0–46.0)
Hemoglobin: 11.4 g/dL — ABNORMAL LOW (ref 12.0–15.0)
Immature Granulocytes: 0 %
Lymphocytes Relative: 23 %
Lymphs Abs: 1.4 10*3/uL (ref 0.7–4.0)
MCH: 33.8 pg (ref 26.0–34.0)
MCHC: 33.8 g/dL (ref 30.0–36.0)
MCV: 100 fL (ref 80.0–100.0)
Monocytes Absolute: 0.9 10*3/uL (ref 0.1–1.0)
Monocytes Relative: 14 %
Neutro Abs: 3.8 10*3/uL (ref 1.7–7.7)
Neutrophils Relative %: 60 %
Platelets: 176 10*3/uL (ref 150–400)
RBC: 3.37 MIL/uL — ABNORMAL LOW (ref 3.87–5.11)
RDW: 12.5 % (ref 11.5–15.5)
WBC: 6.2 10*3/uL (ref 4.0–10.5)
nRBC: 0 % (ref 0.0–0.2)

## 2021-03-23 ENCOUNTER — Ambulatory Visit: Payer: Medicare Other

## 2021-03-23 ENCOUNTER — Other Ambulatory Visit: Payer: Medicare Other

## 2021-03-23 ENCOUNTER — Ambulatory Visit: Payer: Medicare Other | Admitting: Oncology

## 2021-03-25 ENCOUNTER — Inpatient Hospital Stay: Payer: Medicare Other

## 2021-03-25 ENCOUNTER — Other Ambulatory Visit: Payer: Self-pay

## 2021-03-25 ENCOUNTER — Inpatient Hospital Stay (HOSPITAL_BASED_OUTPATIENT_CLINIC_OR_DEPARTMENT_OTHER): Payer: Medicare Other | Admitting: Oncology

## 2021-03-25 ENCOUNTER — Encounter: Payer: Self-pay | Admitting: Oncology

## 2021-03-25 VITALS — BP 114/78 | HR 72 | Temp 97.9°F | Resp 18 | Wt 130.9 lb

## 2021-03-25 DIAGNOSIS — G620 Drug-induced polyneuropathy: Secondary | ICD-10-CM

## 2021-03-25 DIAGNOSIS — C259 Malignant neoplasm of pancreas, unspecified: Secondary | ICD-10-CM | POA: Diagnosis not present

## 2021-03-25 DIAGNOSIS — R7401 Elevation of levels of liver transaminase levels: Secondary | ICD-10-CM

## 2021-03-25 DIAGNOSIS — K8689 Other specified diseases of pancreas: Secondary | ICD-10-CM | POA: Diagnosis not present

## 2021-03-25 DIAGNOSIS — T451X5A Adverse effect of antineoplastic and immunosuppressive drugs, initial encounter: Secondary | ICD-10-CM

## 2021-03-25 DIAGNOSIS — C250 Malignant neoplasm of head of pancreas: Secondary | ICD-10-CM | POA: Diagnosis not present

## 2021-03-25 DIAGNOSIS — Z5111 Encounter for antineoplastic chemotherapy: Secondary | ICD-10-CM

## 2021-03-25 LAB — CBC WITH DIFFERENTIAL/PLATELET
Abs Immature Granulocytes: 0.01 10*3/uL (ref 0.00–0.07)
Basophils Absolute: 0 10*3/uL (ref 0.0–0.1)
Basophils Relative: 0 %
Eosinophils Absolute: 0.1 10*3/uL (ref 0.0–0.5)
Eosinophils Relative: 2 %
HCT: 36.9 % (ref 36.0–46.0)
Hemoglobin: 12.4 g/dL (ref 12.0–15.0)
Immature Granulocytes: 0 %
Lymphocytes Relative: 24 %
Lymphs Abs: 1.5 10*3/uL (ref 0.7–4.0)
MCH: 33.9 pg (ref 26.0–34.0)
MCHC: 33.6 g/dL (ref 30.0–36.0)
MCV: 100.8 fL — ABNORMAL HIGH (ref 80.0–100.0)
Monocytes Absolute: 0.5 10*3/uL (ref 0.1–1.0)
Monocytes Relative: 8 %
Neutro Abs: 4.1 10*3/uL (ref 1.7–7.7)
Neutrophils Relative %: 66 %
Platelets: 206 10*3/uL (ref 150–400)
RBC: 3.66 MIL/uL — ABNORMAL LOW (ref 3.87–5.11)
RDW: 12.1 % (ref 11.5–15.5)
WBC: 6.2 10*3/uL (ref 4.0–10.5)
nRBC: 0 % (ref 0.0–0.2)

## 2021-03-25 LAB — COMPREHENSIVE METABOLIC PANEL
ALT: 77 U/L — ABNORMAL HIGH (ref 0–44)
AST: 30 U/L (ref 15–41)
Albumin: 3.9 g/dL (ref 3.5–5.0)
Alkaline Phosphatase: 332 U/L — ABNORMAL HIGH (ref 38–126)
Anion gap: 10 (ref 5–15)
BUN: 19 mg/dL (ref 8–23)
CO2: 28 mmol/L (ref 22–32)
Calcium: 9.2 mg/dL (ref 8.9–10.3)
Chloride: 102 mmol/L (ref 98–111)
Creatinine, Ser: 0.79 mg/dL (ref 0.44–1.00)
GFR, Estimated: >60 mL/min (ref >60)
Glucose, Bld: 91 mg/dL (ref 70–99)
Potassium: 3.8 mmol/L (ref 3.5–5.1)
Sodium: 140 mmol/L (ref 135–145)
Total Bilirubin: 0.7 mg/dL (ref 0.3–1.2)
Total Protein: 7 g/dL (ref 6.5–8.1)

## 2021-03-25 NOTE — Progress Notes (Signed)
Nutrition Follow-up:  Patient with metastatic pancreatic cancer.  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  RD was planning to meet with patient during infusion but held today.  Planning possible surgery at [facility] on 10/10.  Per patient planning to look at liver and if cancer dead then planning whipple procedure.    Patient reports good appetite and feeling well.    Noted recent covid infection.    Medications: reviewed  Labs: reviewed  Anthropometrics:   Weight 130 lb 14.4 oz on 9/28  126 lb 6.4 oz on 7/26 123 lb on 6/8 124 lb on 5/25 122 lb on 4/13 130 lb on 12/27   NUTRITION DIAGNOSIS: Inadequate oral intake improved   INTERVENTION:  Continue creon. Stressed importance of good nutrition especially foods high in protein prior to surgery.     MONITORING, EVALUATION, GOAL: weight trends, intake   NEXT VISIT: to be determined  Cassandra [name], [place], [place] Registered Dietitian [phone] (mobile)

## 2021-03-25 NOTE — Progress Notes (Signed)
Patient here for follow up. Per Pt, she was seen at [facility] yesterday and saw Dr. [name] who will be doing a procedure on 10/10 and would like for pt to hold chemo today.

## 2021-03-25 NOTE — Progress Notes (Signed)
Hematology/Oncology follow up note [facility] Telephone:([phone]) [phone] [phone]) [phone]   Patient Care Team: [name], MD as PCP - General (Family Medicine) Flinchum, Kelby Aline, FNP (Family Medicine) [name], RN as Oncology Nurse Navigator [name], MD as Consulting Physician (Oncology)  REFERRING PROVIDER: [name], MD  CHIEF COMPLAINTS/REASON FOR VISIT:  Follow up for treatment of pancreatic adenocarcinoma  HISTORY OF PRESENTING ILLNESS:   Cassandra [name] is a  65 y.o.  female with PMH listed below was seen in consultation at the request of  [name], MD  for evaluation of pancreatic adenocarcinoma Patient initially presented with jaundice, transaminitis, bilirubin was 9.9.  CA 19-9 was 1874.  Patient also reports unintentional weight loss. 02/08/2020 MRI abdomen and MRCP with and without contrast was done at [facility] which showed pancreatic head mass measuring up to 3 cm, with marked associated narrowing of the portal vein confluence.  SMA is preserved.  Marked intrahepatic and extrahepatic biliary duct dilatation as well as mild dilatation of the main pancreatic duct. Multiple hepatic masses highly concerning for metastatic disease.  Patient underwent EUS on 02/19/2020, which showed irregular mass identified in the pancreatic head, hypoechoic, measured 47mx33mm, sonographic evidence concerning for invasion into the superior mesenteric artery.  There is no sign of significant abnormality in the main pancreatic duct.  Dilatation of common bile duct which measured up to 16 mm.  Region of celiac artery was visualized and showed no signs of significant abnormality.  No lymphadenopathy.  FNA showed adenocarcinoma.  02/19/2020, ERCP, malignant.  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  The stricture was treated with placement of wall flex metal stent.  [facility] ZPia Mauand was recommended for 3 drug regimen FOLFIRINOX.  [facility]  Patient was referred to establish care today. She denies any pain.  Since stent placement, skin jaundice has improved.  Itchiness has also improved. Patient was accompanied by her husband today.  She has a family history of breast cancer in sister and paternal aunt, colon cancer paternal grandmother.  #no reportable targetable mutation on NGS 9/14/2021cycle 1 FOLFIRINOX.  Patient received oxaliplatin and about 50% of Irinotecan on day 1 and had experienced neurologic symptoms.  She went to ER and working diagnosis is TIA, and eventually I think this is due to irinotecan side effects -irinotecan-associated dysarthria, lip/tongue numbness.  Adjustment was made for Irinotecan to be  infused over 180 minutes.  Atropine 0.5 mg once prior to the irinotecan. No recurrent symptoms.   # 03/19/2020-03/21/2020 patient was admitted due to sepsis with strep pneumonia bacteremia.  Patient was treated with IV Rocephin.  TEE was done which showed no vegetation.  No PFO or ASD.  Patient was discharged home and he finished full course of 14 days of IV Rocephin on 04/02/2020 per ID recommendation.  Repeat blood culture was also negative.  08/25/20 cycle 10 FOLFIRINOX 09/08/20- present  Starting cycle 11, FOLFIRI, Oxaliplatin discontinued due to neuropathy   #NGS showed no reportable targetable mutation #Genetic testing-[employer] diagnostic testing showed no pathological variants identified. MS stable, TMB 070m/mb, KRAS G12D, SF3B1 K700E, TP53 [id number]  #01/14/2021  [facility].  # Her [facility] [[place] Scrapere 4~5 liver lesions on OSH MRI last year at the time of diagnosis highly suspicious  for metastasis. She is not eligible for surgical protocol for metastasis resection given the number of liver metastasis  is >3. However given her excellent and durable response to chemo, surgical resection may be considered pending sustained disease control at 1 year and may require partial hepatectomy first to see if there is viable tumor before proceeding to Whipple resection ]   INTERVAL HISTORY KIFFANY [name] is a 65 y.o. female who has above history reviewed by me today presents for pancreatic cancer.  [facility] 03/23/2021 CT abdomen pelvis No significant change in the ill-defined pancreatic head mass or  associated biliary ductal and pancreatic ductal dilation.  Slight interval enlargement of subcentimeter anterior peripancreatic  lymph node, nonspecific. Attention on follow-up per clinical protocol. Similar enlargement of the ascending thoracic aorta measuring 4.4 cm 03/23/2021 MRI abdomen w and wo  Increasing ill-defined hypoenhancement of the pancreatic head surrounding the common bile duct stent, in keeping with known pancreatic malignancy. Anterior peripancreatic lymph node is better evaluated on CT.  Unchanged position of a common bile duct stent with similar mild diffuse intrahepatic ductal dilation and pneumobilia. No evidence of metastatic disease in the abdomen or pelvis. Partially visualized cystic left adnexal lesion measuring up to 3.9 cm.   She have COVID 19 about one month ago and has been off chemotherapy. She took Paxlvoid. She had liver enzymes flared up and labs are being trended.  No new complaints.    Review of Systems  Constitutional:  Negative for appetite change, chills, fatigue, fever and unexpected weight change.  HENT:   Negative for hearing loss and voice change.   Eyes:  Negative for eye problems.  Respiratory:  Negative for chest tightness and cough.   Cardiovascular:  Negative for chest pain.  Gastrointestinal:  Negative for abdominal distention, abdominal pain and blood in stool.  Endocrine: Negative for hot flashes.  Genitourinary:  Negative for difficulty urinating and  frequency.   Musculoskeletal:  Negative for arthralgias.  Skin:  Negative for itching and rash.  Neurological:  Positive for numbness. Negative for extremity weakness.  Hematological:  Negative for adenopathy.  Psychiatric/Behavioral:  Negative for confusion.    MEDICAL HISTORY:  Past Medical History:  Diagnosis Date   Cancer [facility])    pancreatic cancer   Colon polyps    Family history of breast cancer    Neutropenia (HCC) 04/18/2020   Osteopenia after menopause 05/2017   femoral neck T score -2.0   Personal history of chemotherapy    current for pancreatic ca    SURGICAL HISTORY: Past Surgical History:  Procedure Laterality Date   COLONOSCOPY  07/2015   WNL   COLONOSCOPY  2008/2011   OVARIAN CYST REMOVAL  1992   dermoid-Dr CAK   PORTA CATH INSERTION N/A 03/07/2020   Procedure: PORTA CATH INSERTION;  Surgeon: Annice Needy, MD;  Location: [facility] INVASIVE CV LAB;  Service: Cardiovascular;  Laterality: N/A;   TEE WITHOUT CARDIOVERSION N/A 03/21/2020   Procedure: TRANSESOPHAGEAL ECHOCARDIOGRAM (TEE);  Surgeon: Laurier Nancy, MD;  Location: [facility] ORS;  Service: Cardiovascular;  Laterality: N/A;   TUBAL LIGATION  1993    SOCIAL HISTORY: Social History   Socioeconomic History   Marital status: Married    Spouse name: Not on file   Number of children: 2   Years of education: Not on file   Highest education level: Not on file  Occupational History   Occupation: Teacher    Comment: retired   Occupation: Visual merchandiser  Tobacco Use  Smoking status: Former    Packs/day: 0.50    Years: 10.00    Pack years: 5.00    Types: Cigarettes    Quit date: 82    Years since quitting: 35.7   Smokeless tobacco: Never   Tobacco comments:    Quit smoking 1987  Vaping Use   Vaping Use: Never used  Substance and Sexual Activity   Alcohol use: Not Currently    Comment: 0-2 mixed drinks a day   Drug use: No   Sexual activity: Not Currently    Birth control/protection: Post-menopausal   Other Topics Concern   Not on file  Social History Narrative   Not on file   Social Determinants of Health   Financial Resource Strain: Not on file  Food Insecurity: Not on file  Transportation Needs: Not on file  Physical Activity: Not on file  Stress: Not on file  Social Connections: Not on file  Intimate Partner Violence: Not on file    FAMILY HISTORY: Family History  Problem Relation Age of Onset   Breast cancer Paternal Aunt 75   Diabetes Mother    Osteoporosis Mother    Hyperlipidemia Father    Rheumatic fever Father    Valvular heart disease Father    Cancer Paternal Grandmother        possible colon   Breast cancer Sister 77    ALLERGIES:  is allergic to penicillin g.  MEDICATIONS:  Current Outpatient Medications  Medication Sig Dispense Refill   Calcium Carbonate-Vit D-Min (CALCIUM 1200 PO) Take by mouth.     Cholecalciferol 25 MCG (1000 UT) tablet Take 2,000 Units by mouth daily.     Cyanocobalamin (VITAMIN B 12 PO) Take by mouth.     Multiple Vitamins-Minerals (MULTIVITAMIN WITH MINERALS) tablet Take 1 tablet by mouth daily.     Pancrelipase, Lip-Prot-Amyl, (CREON) 24000-76000 units CPEP Take 1 capsule (24,000 Units total) by mouth 3 (three) times daily before meals. 90 capsule 3   simethicone (GAS-X) 80 MG chewable tablet Chew 1 tablet (80 mg total) by mouth every 8 (eight) hours as needed for flatulence. 60 tablet 0   lidocaine-prilocaine (EMLA) cream Apply to affected area once (Patient not taking: Reported on 03/25/2021) 30 g 3   loperamide (IMODIUM A-D) 2 MG tablet Take 2 at onset of diarrhea, then 1 every 2hrs until 12hr without a BM. May take 2 tab every 4hrs at bedtime. If diarrhea recurs repeat. (Patient not taking: No sig reported) 100 tablet 1   magic mouthwash w/lidocaine SOLN Take 5 mLs by mouth 4 (four) times daily as needed for mouth pain. Sig: Swish & Spit 5-10 ml four times a day as needed. Dispense 480 ml. 1RF (Patient not taking: No sig  reported) 480 mL 1   ondansetron (ZOFRAN) 8 MG tablet Take 1 tablet (8 mg total) by mouth 2 (two) times daily as needed. Start on day 3 after chemotherapy. (Patient not taking: Reported on 03/25/2021) 30 tablet 1   potassium chloride SA (KLOR-CON M20) 20 MEQ tablet Take 1 tablet (20 mEq total) by mouth daily. (Patient not taking: Reported on 03/25/2021) 30 tablet 0   prochlorperazine (COMPAZINE) 10 MG tablet Take 1 tablet (10 mg total) by mouth every 6 (six) hours as needed (Nausea or vomiting). (Patient not taking: Reported on 03/25/2021) 30 tablet 1   No current facility-administered medications for this visit.   Facility-Administered Medications Ordered in Other Visits  Medication Dose Route Frequency Provider Last Rate Last Admin  sodium chloride flush (NS) 0.9 % injection 10 mL  10 mL Intravenous PRN [name], MD   10 mL at 02/18/21 0858   sodium chloride flush (NS) 0.9 % injection 10 mL  10 mL Intravenous Once Borders, Kirt Boys, NP         PHYSICAL EXAMINATION: ECOG PERFORMANCE STATUS: 1 - Symptomatic but completely ambulatory Vitals:   03/25/21 0840  BP: 114/78  Pulse: 72  Resp: 18  Temp: 97.9 F (36.6 C)   Filed Weights   03/25/21 0840  Weight: 130 lb 14.4 oz (59.4 kg)    Physical Exam Constitutional:      General: She is not in acute distress. HENT:     Head: Normocephalic and atraumatic.  Eyes:     General: No scleral icterus. Cardiovascular:     Rate and Rhythm: Normal rate and regular rhythm.     Heart sounds: Normal heart sounds.  Pulmonary:     Effort: Pulmonary effort is normal. No respiratory distress.     Breath sounds: No wheezing.  Abdominal:     General: Bowel sounds are normal. There is no distension.     Palpations: Abdomen is soft.  Musculoskeletal:        General: No deformity. Normal range of motion.     Cervical back: Normal range of motion and neck supple.  Skin:    General: Skin is warm and dry.     Coloration: Skin is not jaundiced.      Findings: No erythema or rash.  Neurological:     Mental Status: She is alert and oriented to person, place, and time. Mental status is at baseline.     Cranial Nerves: No cranial nerve deficit.     Coordination: Coordination normal.  Psychiatric:        Mood and Affect: Mood normal.    LABORATORY DATA:  I have reviewed the data as listed Lab Results  Component Value Date   WBC 6.2 03/25/2021   HGB 12.4 03/25/2021   HCT 36.9 03/25/2021   MCV 100.8 (H) 03/25/2021   PLT 206 03/25/2021   Recent Labs    03/18/21 1341 03/20/21 1444 03/25/21 0813  NA 135 136 140  K 4.0 3.9 3.8  CL 101 100 102  CO2 $Re'28 28 28  'kkV$ GLUCOSE 116* 102* 91  BUN $Re'15 16 19  'sBD$ CREATININE 0.56 0.63 0.79  CALCIUM 8.9 8.9 9.2  GFRNONAA >60 >60 >60  PROT 6.6 6.8 7.0  ALBUMIN 3.7 3.8 3.9  AST 334* 50* 30  ALT 479* 203* 77*  ALKPHOS 501* 383* 332*  BILITOT 1.0 0.5 0.7    Iron/TIBC/Ferritin/ %Sat No results found for: IRON, TIBC, FERRITIN, IRONPCTSAT    RADIOGRAPHIC STUDIES: I have personally reviewed the radiological images as listed and agreed with the findings in the report. Reviewed findings of MRI abdomen [facility]. US Abdomen Complete  Result Date: 03/19/2021 CLINICAL DATA:  Abdominal pain and transaminitis. History of pancreas cancer. EXAM: ABDOMEN ULTRASOUND COMPLETE COMPARISON:  MRI 06/11/2020 FINDINGS: Gallbladder: Gallbladder sludge noted. No gallstones or pericholecystic fluid. Mild diffuse gallbladder wall thickening measuring between 3 and 5 mm. Negative sonographic Murphy's sign. Common bile duct: Diameter: Measures 8.5 mm proximally and up to 17 mm near the head of pancreas. Known common bile duct stent is difficult to visualize. Liver: Intrahepatic bile duct dilatation. Normal parenchymal echogenicity. No mass. Portal vein is patent on color Doppler imaging with normal direction of blood flow towards the liver. IVC: No  abnormality visualized. Pancreas: The head of the pancreas is prominent  compatible with the history of known pancreatic adenocarcinoma. Spleen: Size and appearance within normal limits. Right Kidney: Length: 10.3 cm. Echogenicity within normal limits. No mass or hydronephrosis visualized. Left Kidney: Length: 10.0 cm. Echogenicity within normal limits. No mass or hydronephrosis visualized. Abdominal aorta: No aneurysm visualized. Other findings: None. IMPRESSION: 1. Intrahepatic and extrahepatic bile duct dilatation. This is similar to the CT from 10/30/2020. Known common bile duct stent is difficult to visualize. 2. Gallbladder sludge with mild diffuse gallbladder wall thickening. No gallstones, pericholecystic fluid, or sonographic Murphy's sign. Electronically Signed   By: Kerby Moors M.D.   On: 03/19/2021 10:22   MM 3D SCREEN BREAST BILATERAL  Result Date: 01/02/2021 CLINICAL DATA:  Screening. EXAM: DIGITAL SCREENING BILATERAL MAMMOGRAM WITH TOMOSYNTHESIS AND CAD TECHNIQUE: Bilateral screening digital craniocaudal and mediolateral oblique mammograms were obtained. Bilateral screening digital breast tomosynthesis was performed. The images were evaluated with computer-aided detection. COMPARISON:  Previous exam(s). ACR Breast Density Category c: The breast tissue is heterogeneously dense, which may obscure small masses. FINDINGS: There are no findings suspicious for malignancy. IMPRESSION: No mammographic evidence of malignancy. A result letter of this screening mammogram will be mailed directly to the patient. RECOMMENDATION: Screening mammogram in one year (as long as the patient has a life expectancy of 10+ years with her diagnosis of pancreatic carcinoma). (Code:SM-B-01Y) BI-RADS CATEGORY  1: Negative. Electronically Signed   By: Evangeline Dakin M.D.   On: 01/02/2021 13:53     ASSESSMENT & PLAN:  1. Primary pancreatic cancer ([place])   2. 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 Pancreatic insufficiency   5.  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 Hold off chemotherapy due to her upcoming surgery on 10/10 with Dr.Zani.  Dr.Zani's note is not available. From history provided by patient, Dr.Zani plans to sample/resect part her liver to see if any viable tumor.  If no tumor, then patient will proceed with whipple surgery.    Acute transaminitis, trending down. Likely reactive to COVID19 or Paxlvoid.   pancreatic insufficiency.  Continue Creon,continue  24000 units TID with meals.  Continue.  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   Patient follows up with neurology for management.Marland Kitchen   #Elevated CEA,-this may be related to her pancreatic cancer.  Patient is due for colonoscopy and I recommend patient to follow-up with gastroenterology   We spent sufficient time to discuss many aspect of care, questions were answered to patient's satisfaction.    All questions were answered. The patient knows to call the clinic with any problems questions or concerns.  Return of visit: TBD. Advise patient to update me about her plan of surgery at [facility].    [name], MD, PhD  03/25/2021

## 2021-03-27 ENCOUNTER — Inpatient Hospital Stay: Payer: Medicare Other

## 2021-04-10 ENCOUNTER — Telehealth: Payer: Self-pay

## 2021-04-10 ENCOUNTER — Other Ambulatory Visit: Payer: Self-pay | Admitting: *Deleted

## 2021-04-10 MED ORDER — CREON 24000-76000 UNITS PO CPEP
1.0000 | ORAL_CAPSULE | Freq: Three times a day (TID) | ORAL | 3 refills | Status: DC
Start: 2021-04-10 — End: 2021-07-01

## 2021-04-10 NOTE — Telephone Encounter (Signed)
Per Triage nurse, Hassan Rowan: Patient called requesting a refill of her Creon and to let Dr Tasia Catchings know that her Whipple procedure will be done on 10/24. She is asking if she should have her port flushed next week since it will be a while before she will be coming back to [place] to get it done.  Sent pt a Mychart message informing her that Creon refill was sent and that a port flushed will be scheduled next week. Also advised her to call back after she is discharged from hospital after Whipple surgery, this so we can schedule follow up, 2 weeks after surgery.

## 2021-04-10 NOTE — Telephone Encounter (Signed)
Patient called requesting a refill of her Creon and to let Dr Tasia Catchings know that her Whipple procedure will be done on 10/24. She is asking if she should have her port flushed next week since it will be a while before she will be coming back to [place] to get it done. Please advise

## 2021-04-13 ENCOUNTER — Other Ambulatory Visit: Payer: Self-pay

## 2021-04-13 ENCOUNTER — Inpatient Hospital Stay: Payer: Medicare Other | Attending: Oncology

## 2021-04-13 DIAGNOSIS — C250 Malignant neoplasm of head of pancreas: Secondary | ICD-10-CM | POA: Insufficient documentation

## 2021-04-13 DIAGNOSIS — C787 Secondary malignant neoplasm of liver and intrahepatic bile duct: Secondary | ICD-10-CM | POA: Insufficient documentation

## 2021-04-13 DIAGNOSIS — Z452 Encounter for adjustment and management of vascular access device: Secondary | ICD-10-CM | POA: Diagnosis not present

## 2021-04-13 DIAGNOSIS — Z95828 Presence of other vascular implants and grafts: Secondary | ICD-10-CM

## 2021-04-13 MED ORDER — HEPARIN SOD (PORK) LOCK FLUSH 100 UNIT/ML IV SOLN
INTRAVENOUS | Status: AC
  Filled 2021-04-13: qty 5

## 2021-04-13 MED ORDER — HEPARIN SOD (PORK) LOCK FLUSH 100 UNIT/ML IV SOLN
500.0000 [IU] | Freq: Once | INTRAVENOUS | Status: AC
Start: 2021-04-13 — End: 2021-04-13
  Administered 2021-04-13: 500 [IU] via INTRAVENOUS
  Filled 2021-04-13: qty 5

## 2021-04-13 MED ORDER — SODIUM CHLORIDE 0.9% FLUSH
10.0000 mL | Freq: Once | INTRAVENOUS | Status: AC
  Administered 2021-04-13: 10 mL via INTRAVENOUS
  Filled 2021-04-13: qty 10

## 2021-04-17 ENCOUNTER — Telehealth: Payer: Self-pay | Admitting: *Deleted

## 2021-04-17 ENCOUNTER — Telehealth: Payer: Self-pay | Admitting: Oncology

## 2021-04-17 NOTE — Telephone Encounter (Signed)
Patient called reporting that her surgery scheduled for 04/20/21 has been cancelled by Dr Sung Amabile who also wants to speak with Dr Tasia Catchings regarding chemotherapy he feels the patient needs to start on. She states she needs to get started on chemotherapy.Please advise

## 2021-04-20 ENCOUNTER — Encounter: Payer: Self-pay | Admitting: Oncology

## 2021-04-20 ENCOUNTER — Encounter: Payer: Self-pay | Admitting: *Deleted

## 2021-04-22 ENCOUNTER — Encounter: Payer: Self-pay | Admitting: Oncology

## 2021-04-22 NOTE — Telephone Encounter (Signed)
Forward to team

## 2021-04-28 ENCOUNTER — Inpatient Hospital Stay: Payer: Medicare Other | Attending: Oncology

## 2021-04-28 ENCOUNTER — Ambulatory Visit
Admission: RE | Admit: 2021-04-28 | Discharge: 2021-04-28 | Disposition: A | Discharge status: Home / Self Care | Payer: Medicare Other | Source: Ambulatory Visit | Attending: Oncology | Admitting: Oncology

## 2021-04-28 ENCOUNTER — Inpatient Hospital Stay: Payer: Medicare Other

## 2021-04-28 ENCOUNTER — Other Ambulatory Visit: Payer: Self-pay

## 2021-04-28 ENCOUNTER — Encounter: Payer: Self-pay | Admitting: Oncology

## 2021-04-28 ENCOUNTER — Inpatient Hospital Stay (HOSPITAL_BASED_OUTPATIENT_CLINIC_OR_DEPARTMENT_OTHER): Payer: Medicare Other | Admitting: Oncology

## 2021-04-28 VITALS — BP 121/85 | HR 64 | Resp 18

## 2021-04-28 VITALS — BP 110/78 | HR 64 | Temp 98.5°F | Wt 133.9 lb

## 2021-04-28 DIAGNOSIS — T451X5A Adverse effect of antineoplastic and immunosuppressive drugs, initial encounter: Secondary | ICD-10-CM | POA: Diagnosis not present

## 2021-04-28 DIAGNOSIS — C259 Malignant neoplasm of pancreas, unspecified: Secondary | ICD-10-CM

## 2021-04-28 DIAGNOSIS — G620 Drug-induced polyneuropathy: Secondary | ICD-10-CM | POA: Insufficient documentation

## 2021-04-28 DIAGNOSIS — Z95828 Presence of other vascular implants and grafts: Secondary | ICD-10-CM | POA: Insufficient documentation

## 2021-04-28 DIAGNOSIS — Z5111 Encounter for antineoplastic chemotherapy: Secondary | ICD-10-CM | POA: Insufficient documentation

## 2021-04-28 DIAGNOSIS — R7989 Other specified abnormal findings of blood chemistry: Secondary | ICD-10-CM | POA: Insufficient documentation

## 2021-04-28 DIAGNOSIS — Z87891 Personal history of nicotine dependence: Secondary | ICD-10-CM | POA: Insufficient documentation

## 2021-04-28 DIAGNOSIS — Z8616 Personal history of COVID-19: Secondary | ICD-10-CM | POA: Insufficient documentation

## 2021-04-28 DIAGNOSIS — Z8349 Family history of other endocrine, nutritional and metabolic diseases: Secondary | ICD-10-CM | POA: Insufficient documentation

## 2021-04-28 DIAGNOSIS — Z9049 Acquired absence of other specified parts of digestive tract: Secondary | ICD-10-CM | POA: Insufficient documentation

## 2021-04-28 DIAGNOSIS — Z452 Encounter for adjustment and management of vascular access device: Secondary | ICD-10-CM | POA: Insufficient documentation

## 2021-04-28 DIAGNOSIS — D6481 Anemia due to antineoplastic chemotherapy: Secondary | ICD-10-CM | POA: Insufficient documentation

## 2021-04-28 DIAGNOSIS — R970 Elevated carcinoembryonic antigen [CEA]: Secondary | ICD-10-CM | POA: Insufficient documentation

## 2021-04-28 DIAGNOSIS — C787 Secondary malignant neoplasm of liver and intrahepatic bile duct: Secondary | ICD-10-CM | POA: Insufficient documentation

## 2021-04-28 DIAGNOSIS — Z79899 Other long term (current) drug therapy: Secondary | ICD-10-CM | POA: Diagnosis not present

## 2021-04-28 DIAGNOSIS — Z803 Family history of malignant neoplasm of breast: Secondary | ICD-10-CM | POA: Insufficient documentation

## 2021-04-28 DIAGNOSIS — K8689 Other specified diseases of pancreas: Secondary | ICD-10-CM | POA: Insufficient documentation

## 2021-04-28 DIAGNOSIS — Z8262 Family history of osteoporosis: Secondary | ICD-10-CM | POA: Insufficient documentation

## 2021-04-28 DIAGNOSIS — Z833 Family history of diabetes mellitus: Secondary | ICD-10-CM | POA: Insufficient documentation

## 2021-04-28 DIAGNOSIS — Z809 Family history of malignant neoplasm, unspecified: Secondary | ICD-10-CM | POA: Insufficient documentation

## 2021-04-28 HISTORY — PX: IR CV LINE INJECTION: IMG2294

## 2021-04-28 LAB — COMPREHENSIVE METABOLIC PANEL
ALT: 25 U/L (ref 0–44)
AST: 22 U/L (ref 15–41)
Albumin: 4 g/dL (ref 3.5–5.0)
Alkaline Phosphatase: 122 U/L (ref 38–126)
Anion gap: 7 (ref 5–15)
BUN: 20 mg/dL (ref 8–23)
CO2: 28 mmol/L (ref 22–32)
Calcium: 9 mg/dL (ref 8.9–10.3)
Chloride: 106 mmol/L (ref 98–111)
Creatinine, Ser: 0.67 mg/dL (ref 0.44–1.00)
GFR, Estimated: >60 mL/min (ref >60)
Glucose, Bld: 75 mg/dL (ref 70–99)
Potassium: 4.2 mmol/L (ref 3.5–5.1)
Sodium: 141 mmol/L (ref 135–145)
Total Bilirubin: 0.2 mg/dL — ABNORMAL LOW (ref 0.3–1.2)
Total Protein: 6.9 g/dL (ref 6.5–8.1)

## 2021-04-28 LAB — CBC WITH DIFFERENTIAL/PLATELET
Abs Immature Granulocytes: 0.01 10*3/uL (ref 0.00–0.07)
Basophils Absolute: 0 10*3/uL (ref 0.0–0.1)
Basophils Relative: 1 %
Eosinophils Absolute: 0.5 10*3/uL (ref 0.0–0.5)
Eosinophils Relative: 9 %
HCT: 37.7 % (ref 36.0–46.0)
Hemoglobin: 12.7 g/dL (ref 12.0–15.0)
Immature Granulocytes: 0 %
Lymphocytes Relative: 29 %
Lymphs Abs: 1.5 10*3/uL (ref 0.7–4.0)
MCH: 33.1 pg (ref 26.0–34.0)
MCHC: 33.7 g/dL (ref 30.0–36.0)
MCV: 98.2 fL (ref 80.0–100.0)
Monocytes Absolute: 0.4 10*3/uL (ref 0.1–1.0)
Monocytes Relative: 8 %
Neutro Abs: 2.7 10*3/uL (ref 1.7–7.7)
Neutrophils Relative %: 53 %
Platelets: 164 10*3/uL (ref 150–400)
RBC: 3.84 MIL/uL — ABNORMAL LOW (ref 3.87–5.11)
RDW: 11.9 % (ref 11.5–15.5)
WBC: 5.2 10*3/uL (ref 4.0–10.5)
nRBC: 0 % (ref 0.0–0.2)

## 2021-04-28 IMAGING — XA IR CENTRAL VENOUS CATHETER
1 series · 13 of 13 positions shown · IV contrast (IODINE)
Comparison: none

CLINICAL DATA: no blood return

EXAM:
PORT  CATHETER INJECTION UNDER FLUOROSCOPY
TECHNIQUE: The procedure, risks (including but not limited to bleeding,
infection, organ damage ), benefits, and alternatives were explained
to the patient. Questions regarding the procedure were encouraged
and answered. The patient understands and consents to the procedure.

[Series 1: interv standard · 4 acquisitions, 13 frames shown]
[im 1/4]
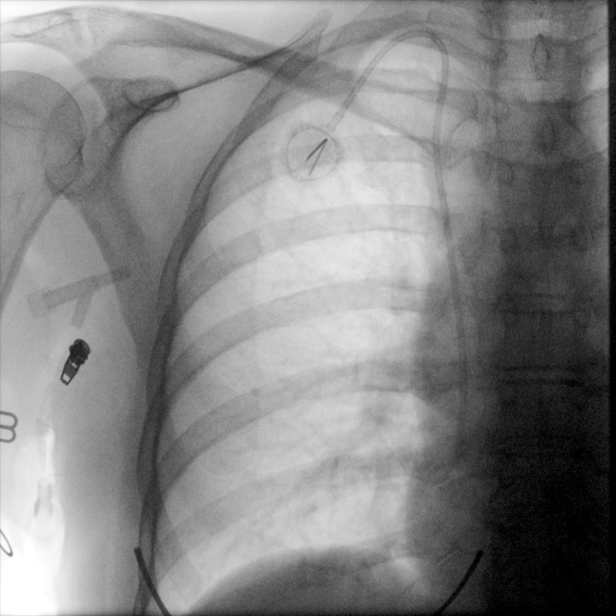
[im 2/4]
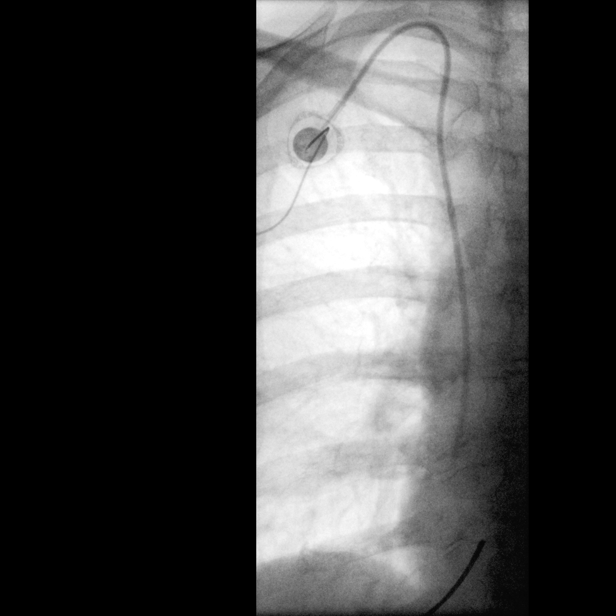
[im 2/4]
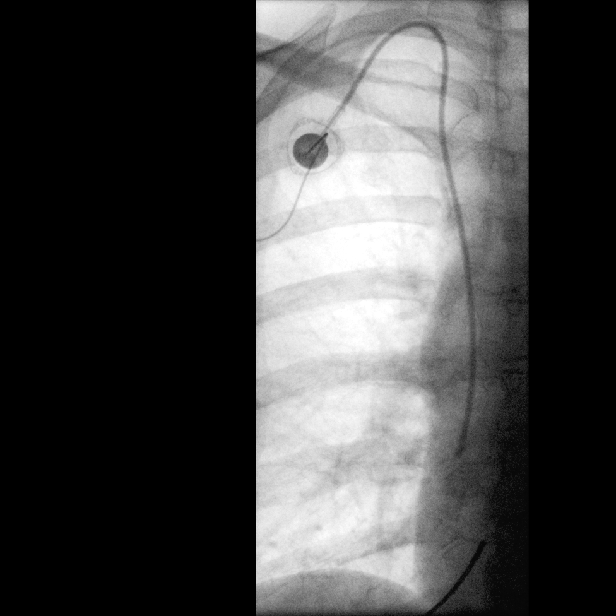
[im 2/4]
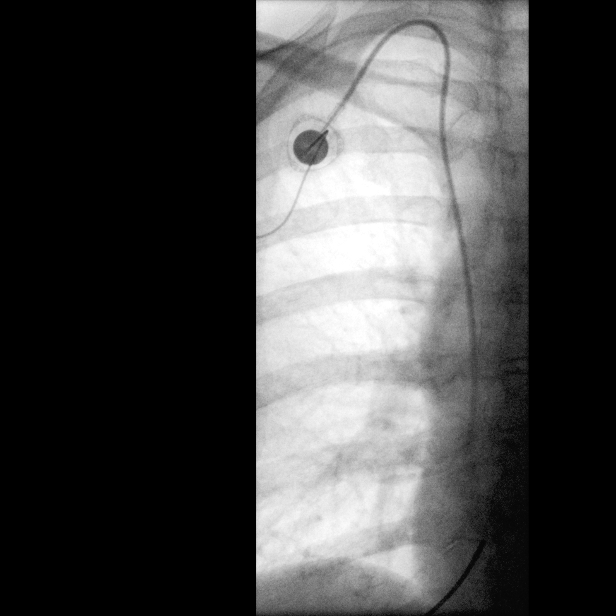
[im 2/4]
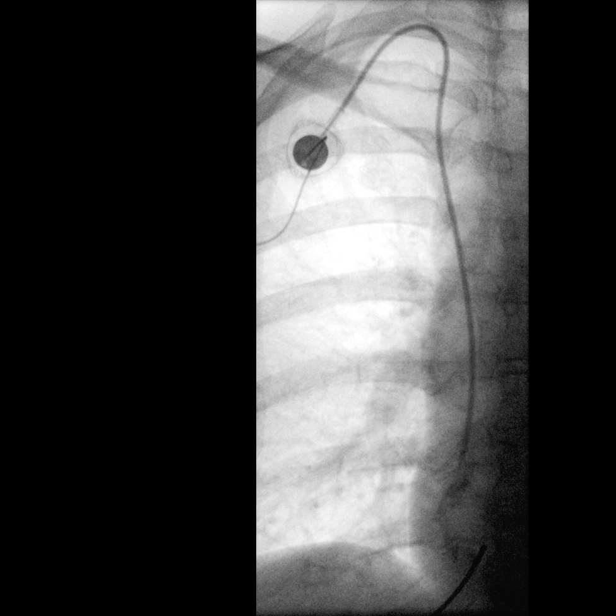
[im 3/4]
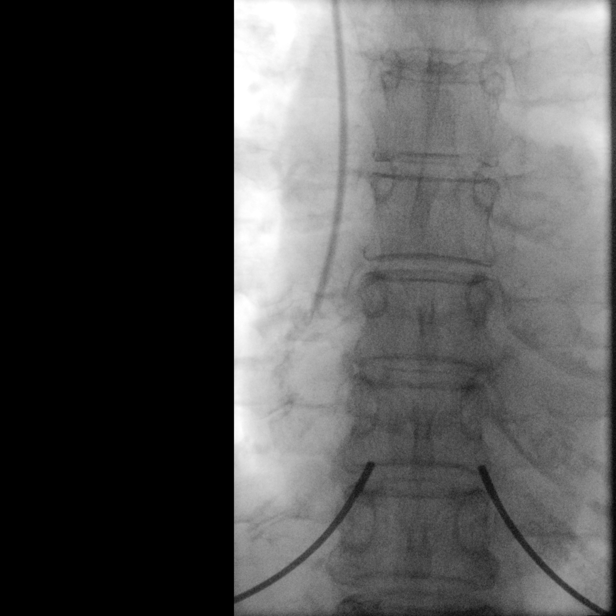
[im 3/4]
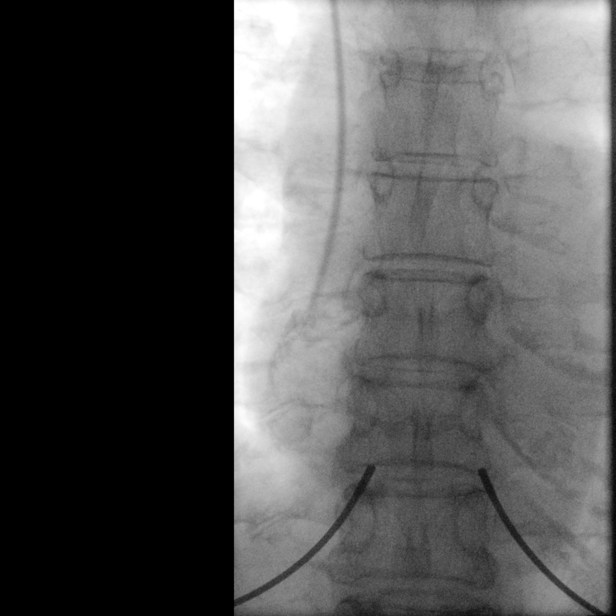
[im 3/4]
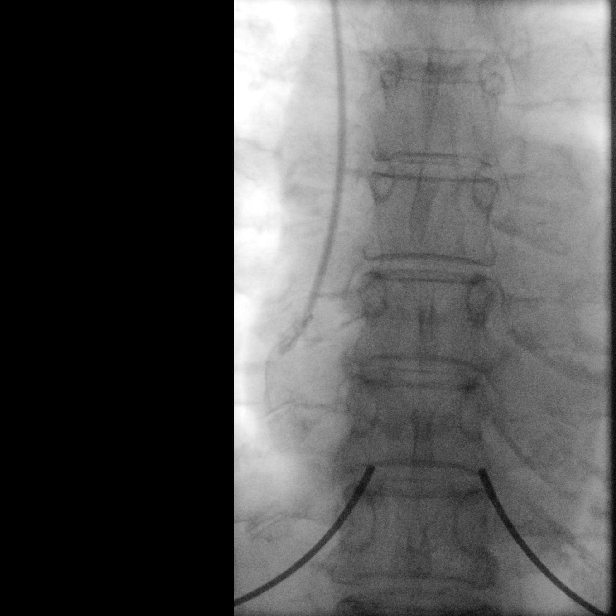
[im 3/4]
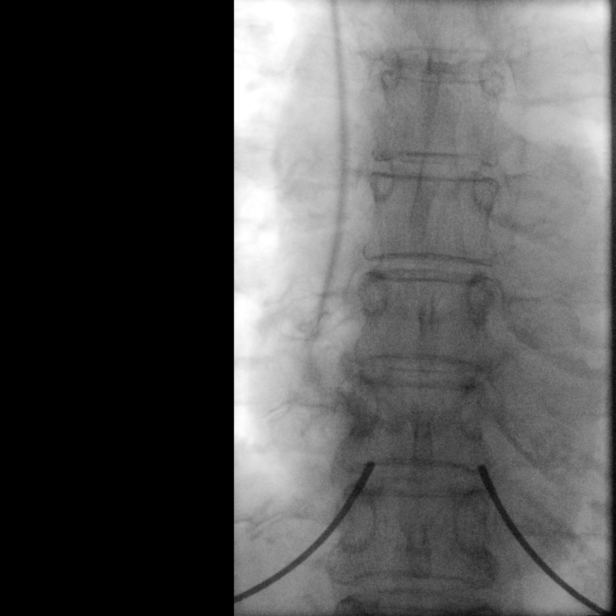
[im 4/4]
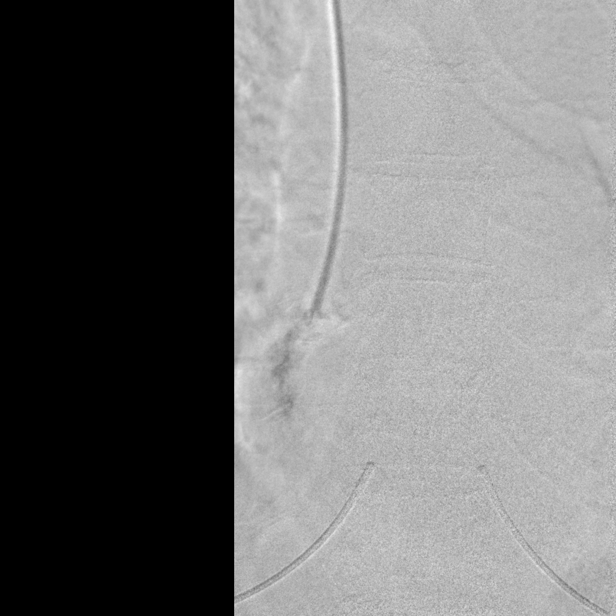
[im 4/4]
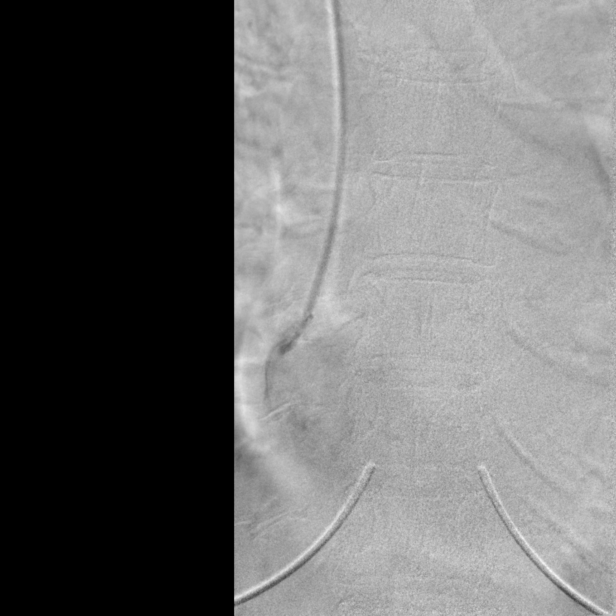
[im 4/4]
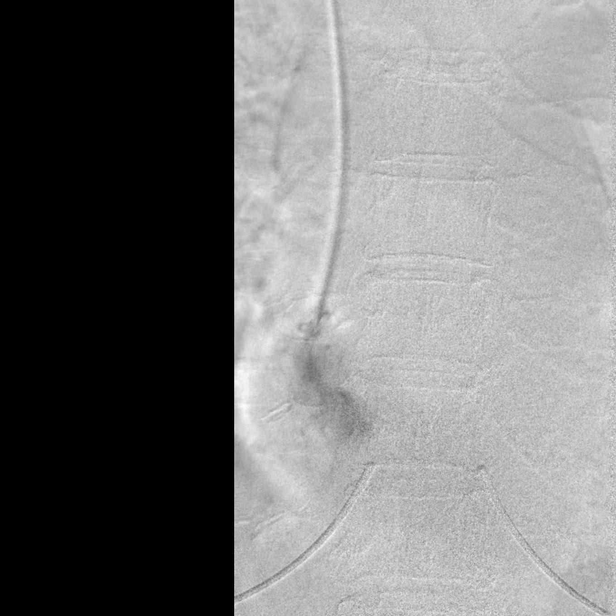
[im 4/4]
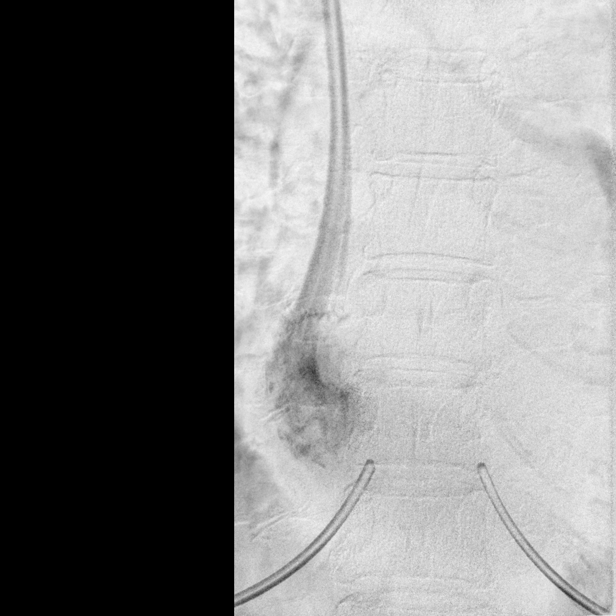

[13 of 13 positions shown; findings below may reference images not displayed]

Survey fluoroscopic inspection reveals good position of right IJ
power injectable port catheter, tip in the proximal right atrium. No
catheter discontinuity or fracture.

Injection demonstrates patency of the system. No extravasation. No
evidence of fibrin sheath. Contrast flows freely into the right
atrium.
IMPRESSION: 1. Normal appearance and function of right IJ power injectable port
catheter. Okay for routine use. Consider Cathflo dwell per protocol
if aspiration issues persist.

## 2021-04-28 MED ORDER — DIPHENHYDRAMINE HCL 25 MG PO CAPS: 25.0000 mg | ORAL_CAPSULE | Freq: Once | ORAL | Status: DC

## 2021-04-28 MED ORDER — SODIUM CHLORIDE 0.9 % IV SOLN
Freq: Once | INTRAVENOUS | Status: AC
  Filled 2021-04-28: qty 250

## 2021-04-28 MED ORDER — PALONOSETRON HCL INJECTION 0.25 MG/5ML
0.2500 mg | Freq: Once | INTRAVENOUS | Status: AC
  Administered 2021-04-28: 0.25 mg via INTRAVENOUS
  Filled 2021-04-28: qty 5

## 2021-04-28 MED ORDER — DIPHENHYDRAMINE HCL 25 MG PO CAPS
25.0000 mg | ORAL_CAPSULE | Freq: Once | ORAL | Status: AC
  Administered 2021-04-28: 25 mg via ORAL
  Filled 2021-04-28: qty 1

## 2021-04-28 MED ORDER — SODIUM CHLORIDE 0.9 % IV SOLN
650.0000 mg | Freq: Once | INTRAVENOUS | Status: AC
  Administered 2021-04-28: 650 mg via INTRAVENOUS
  Filled 2021-04-28: qty 32.5

## 2021-04-28 MED ORDER — SODIUM CHLORIDE 0.9% FLUSH
10.0000 mL | Freq: Once | INTRAVENOUS | Status: AC
  Filled 2021-04-28: qty 10

## 2021-04-28 MED ORDER — IOHEXOL 350 MG/ML SOLN
5.0000 mL | Freq: Once | INTRAVENOUS | Status: AC | PRN
  Administered 2021-04-28: 5 mL
  Filled 2021-04-28: qty 5

## 2021-04-28 MED ORDER — CLINDAMYCIN HCL 300 MG PO CAPS: 600.0000 mg | ORAL_CAPSULE | Freq: Once | ORAL | 0 refills | Status: AC

## 2021-04-28 MED ORDER — SODIUM CHLORIDE 0.9 % IV SOLN
150.0000 mg/m2 | Freq: Once | INTRAVENOUS | Status: AC
  Administered 2021-04-28: 260 mg via INTRAVENOUS
  Filled 2021-04-28: qty 10

## 2021-04-28 MED ORDER — SODIUM CHLORIDE 0.9 % IV SOLN
10.0000 mg | Freq: Once | INTRAVENOUS | Status: AC
  Administered 2021-04-28: 10 mg via INTRAVENOUS
  Filled 2021-04-28: qty 10

## 2021-04-28 MED ORDER — SODIUM CHLORIDE 0.9 % IV SOLN
150.0000 mg | Freq: Once | INTRAVENOUS | Status: AC
  Administered 2021-04-28: 150 mg via INTRAVENOUS
  Filled 2021-04-28: qty 150

## 2021-04-28 MED ORDER — ATROPINE SULFATE 1 MG/ML IV SOLN
0.5000 mg | Freq: Once | INTRAVENOUS | Status: AC
  Administered 2021-04-28: 0.5 mg via INTRAVENOUS
  Filled 2021-04-28: qty 1

## 2021-04-28 MED ORDER — SODIUM CHLORIDE 0.9 % IV SOLN
2400.0000 mg/m2 | INTRAVENOUS | Status: DC
  Administered 2021-04-28: 4000 mg via INTRAVENOUS
  Filled 2021-04-28: qty 80

## 2021-04-28 NOTE — Progress Notes (Signed)
Hematology/Oncology follow up note [facility] Telephone:([phone]) [phone] Fax:(336) [phone]   Patient Care Team: [name], MD as PCP - General (Family Medicine) Flinchum, Kelby Aline, FNP (Family Medicine) [name], RN as Oncology Nurse Navigator [name], MD as Consulting Physician (Oncology)  REFERRING PROVIDER: [name], MD  CHIEF COMPLAINTS/REASON FOR VISIT:  Follow up for treatment of pancreatic adenocarcinoma  HISTORY OF PRESENTING ILLNESS:   Cassandra [name] is a  65 y.o.  female with PMH listed below was seen in consultation at the request of  [name], MD  for evaluation of pancreatic adenocarcinoma Patient initially presented with jaundice, transaminitis, bilirubin was 9.9.  CA 19-9 was 1874.  Patient also reports unintentional weight loss. 02/08/2020 MRI abdomen and MRCP with and without contrast was done at [facility] which showed pancreatic head mass measuring up to 3 cm, with marked associated narrowing of the portal vein confluence.  SMA is preserved.  Marked intrahepatic and extrahepatic biliary duct dilatation as well as mild dilatation of the main pancreatic duct. Multiple hepatic masses highly concerning for metastatic disease.  Patient underwent EUS on 02/19/2020, which showed irregular mass identified in the pancreatic head, hypoechoic, measured 47mx33mm, sonographic evidence concerning for invasion into the superior mesenteric artery.  There is no sign of significant abnormality in the main pancreatic duct.  Dilatation of common bile duct which measured up to 16 mm.  Region of celiac artery was visualized and showed no signs of significant abnormality.  No lymphadenopathy.  FNA showed adenocarcinoma.  02/19/2020, ERCP, malignant.  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  The stricture was treated with placement of wall flex metal stent.  [facility] ZPia Mauand was recommended for 3 drug regimen FOLFIRINOX.  [facility]  Patient was referred to establish care today. She denies any pain.  Since stent placement, skin jaundice has improved.  Itchiness has also improved. Patient was accompanied by her husband today.  She has a family history of breast cancer in sister and paternal aunt, colon cancer paternal grandmother.  #no reportable targetable mutation on NGS 9/14/2021cycle 1 FOLFIRINOX.  Patient received oxaliplatin and about 50% of Irinotecan on day 1 and had experienced neurologic symptoms.  She went to ER and working diagnosis is TIA, and eventually I think this is due to irinotecan side effects -irinotecan-associated dysarthria, lip/tongue numbness.  Adjustment was made for Irinotecan to be  infused over 180 minutes.  Atropine 0.5 mg once prior to the irinotecan. No recurrent symptoms.   # 03/19/2020-03/21/2020 patient was admitted due to sepsis with strep pneumonia bacteremia.  Patient was treated with IV Rocephin.  TEE was done which showed no vegetation.  No PFO or ASD.  Patient was discharged home and he finished full course of 14 days of IV Rocephin on 04/02/2020 per ID recommendation.  Repeat blood culture was also negative.  08/25/20 cycle 10 FOLFIRINOX 09/08/20- present  Starting cycle 11, FOLFIRI, Oxaliplatin discontinued due to neuropathy   #NGS showed no reportable targetable mutation #Genetic testing-[employer] diagnostic testing showed no pathological variants identified. MS stable, TMB 070m/mb, KRAS G12D, SF3B1 K700E, TP53 [id number]  #01/14/2021  [facility].  # Her [facility] [Th[place]ton Scrapere 4~5 liver lesions on OSH MRI last year at the time of diagnosis highly suspicious  for metastasis. She is not eligible for surgical protocol for metastasis resection given the number of liver metastasis  is >3. However given her excellent and durable response to chemo, surgical resection may be considered pending sustained disease control at 1 year and may require partial hepatectomy first to see if there is viable tumor before proceeding to Whipple resection ]  Patient had a COVID-19 infection in September 2022.  03/23/2021 CT abdomen pelvis No significant change in the ill-defined pancreatic head mass or  associated biliary ductal and pancreatic ductal dilation.  Slight interval enlargement of subcentimeter anterior peripancreatic  lymph node, nonspecific. Attention on follow-up per clinical protocol. Similar enlargement of the ascending thoracic aorta measuring 4.4 cm  03/23/2021 MRI abdomen w and wo  Increasing ill-defined hypoenhancement of the pancreatic head surrounding the common bile duct stent, in keeping with known pancreatic malignancy. Anterior peripancreatic lymph node is better evaluated on CT.  Unchanged position of a common bile duct stent with similar mild diffuse intrahepatic ductal dilation and pneumobilia. No evidence of metastatic disease in the abdomen or pelvis. Partially visualized cystic left adnexal lesion measuring up to 3.9 cm.   INTERVAL HISTORY Cassandra [name] is a 65 y.o. female who has above history reviewed by me today presents for pancreatic cancer.   04/10/2021, patient underwent liver [facility] at [facility], by Dr. Lorenz Coaster.  With Pathology report from [place] was reviewed. Segments 3 partial hepatectomy, negative for viable tumor. Section 5/6 partial hepatectomy part 1 and 2, microscopic foci of residual viable adenocarcinoma, morphologically consistent with history of pancreatic primary.  Parenchymal margin is uninvolved.  Patient presents to restart chemotherapy. Overall she is feeling well.  Recovering from her procedure. Today denies any nausea vomiting diarrhea, fever or chills, abdominal pain.  Review of Systems  Constitutional:  Negative for appetite change,  chills, fatigue, fever and unexpected weight change.  HENT:   Negative for hearing loss and voice change.   Eyes:  Negative for eye problems.  Respiratory:  Negative for chest tightness and cough.   Cardiovascular:  Negative for chest pain.  Gastrointestinal:  Negative for abdominal distention, abdominal pain and blood in stool.  Endocrine: Negative for hot flashes.  Genitourinary:  Negative for difficulty urinating and frequency.   Musculoskeletal:  Negative for arthralgias.  Skin:  Negative for itching and rash.  Neurological:  Positive for numbness. Negative for extremity weakness.  Hematological:  Negative for adenopathy.  Psychiatric/Behavioral:  Negative for confusion.    MEDICAL HISTORY:  Past Medical History:  Diagnosis Date   Cancer [facility])    pancreatic cancer   Colon polyps    Family history of breast cancer    Neutropenia ([place]) 04/18/2020   Osteopenia after menopause 05/2017   femoral neck T score -2.0   Personal history of chemotherapy    current for pancreatic ca    SURGICAL HISTORY: Past Surgical History:  Procedure Laterality Date   COLONOSCOPY  07/2015   WNL   COLONOSCOPY  2008/2011   IR CV LINE INJECTION  04/28/2021   OVARIAN CYST REMOVAL  1992   dermoid-Dr CAK   PORTA CATH INSERTION N/A 03/07/2020   Procedure: PORTA CATH INSERTION;  Surgeon: Algernon Huxley, MD;  Location: [place] CV LAB;  Service: Cardiovascular;  Laterality: N/A;   TEE WITHOUT CARDIOVERSION N/A 03/21/2020   Procedure: TRANSESOPHAGEAL ECHOCARDIOGRAM (TEE);  Surgeon: Dionisio David, MD;  Location: [facility] ORS;  Service: Cardiovascular;  Laterality: N/A;   [place]  HISTORY: Social History   Socioeconomic History   Marital status: Married    Spouse name: Not on file   Number of children: 2   Years of education: Not on file   Highest education level: Not on file  Occupational History   Occupation: Pharmacist, hospital    Comment: retired   Occupation: Psychologist, sport and exercise  Tobacco Use    Smoking status: Former    Packs/day: 0.50    Years: 10.00    Pack years: 5.00    Types: Cigarettes    Quit date: 1987    Years since quitting: 35.8   Smokeless tobacco: Never   Tobacco comments:    Quit smoking 1987  Vaping Use   Vaping Use: Never used  Substance and Sexual Activity   Alcohol use: Not Currently    Comment: 0-2 mixed drinks a day   Drug use: No   Sexual activity: Not Currently    Birth control/protection: Post-menopausal  Other Topics Concern   Not on file  Social History Narrative   Not on file   Social Determinants of Health   Financial Resource Strain: Not on file  Food Insecurity: Not on file  Transportation Needs: Not on file  Physical Activity: Not on file  Stress: Not on file  Social Connections: Not on file  Intimate Partner Violence: Not on file    FAMILY HISTORY: Family History  Problem Relation Age of Onset   Breast cancer Paternal Aunt 38   Diabetes Mother    Osteoporosis Mother    Hyperlipidemia Father    Rheumatic fever Father    Valvular heart disease Father    Cancer Paternal Grandmother        possible colon   Breast cancer Sister 51    ALLERGIES:  is allergic to penicillin g.  MEDICATIONS:  Current Outpatient Medications  Medication Sig Dispense Refill   Calcium Carbonate-Vit D-Min (CALCIUM 1200 PO) Take by mouth.     Cholecalciferol 25 MCG (1000 UT) tablet Take 2,000 Units by mouth daily.     clindamycin (CLEOCIN) 300 MG capsule Take 2 capsules (600 mg total) by mouth once for 1 dose. Take prior to dental procedure 2 capsule 0   Cyanocobalamin (VITAMIN B 12 PO) Take by mouth.     Multiple Vitamins-Minerals (MULTIVITAMIN WITH MINERALS) tablet Take 1 tablet by mouth daily.     Pancrelipase, Lip-Prot-Amyl, (CREON) 24000-76000 units CPEP Take 1 capsule (24,000 Units total) by mouth 3 (three) times daily before meals. 90 capsule 3   simethicone (GAS-X) 80 MG chewable tablet Chew 1 tablet (80 mg total) by mouth every 8 (eight)  hours as needed for flatulence. 60 tablet 0   lidocaine-prilocaine (EMLA) cream Apply to affected area once (Patient not taking: Reported on 04/28/2021) 30 g 3   loperamide (IMODIUM A-D) 2 MG tablet Take 2 at onset of diarrhea, then 1 every 2hrs until 12hr without a BM. May take 2 tab every 4hrs at bedtime. If diarrhea recurs repeat. (Patient not taking: No sig reported) 100 tablet 1   magic mouthwash w/lidocaine SOLN Take 5 mLs by mouth 4 (four) times daily as needed for mouth pain. Sig: Swish & Spit 5-10 ml four times a day as needed. Dispense 480 ml. 1RF (Patient not taking: No sig reported) 480 mL 1   ondansetron (ZOFRAN) 8 MG tablet Take 1 tablet (8 mg total) by mouth 2 (two) times daily as needed. Start on day 3 after chemotherapy. (Patient not taking: No sig reported) 30 tablet 1  potassium chloride SA (KLOR-CON M20) 20 MEQ tablet Take 1 tablet (20 mEq total) by mouth daily. (Patient not taking: No sig reported) 30 tablet 0   prochlorperazine (COMPAZINE) 10 MG tablet Take 1 tablet (10 mg total) by mouth every 6 (six) hours as needed (Nausea or vomiting). (Patient not taking: No sig reported) 30 tablet 1   No current facility-administered medications for this visit.   Facility-Administered Medications Ordered in Other Visits  Medication Dose Route Frequency Provider Last Rate Last Admin   diphenhydrAMINE (BENADRYL) capsule 25 mg  25 mg Oral Once [name], MD       fluorouracil (ADRUCIL) 4,000 mg in sodium chloride 0.9 % 70 mL chemo infusion  2,400 mg/m2 (Order-Specific) Intravenous 1 day or 1 dose [name], MD       irinotecan (CAMPTOSAR) 260 mg in sodium chloride 0.9 % 500 mL chemo infusion  150 mg/m2 (Order-Specific) Intravenous Once [name], MD 171 mL/hr at 04/28/21 1201 260 mg at 04/28/21 1201   leucovorin 650 mg in sodium chloride 0.9 % 250 mL infusion  650 mg Intravenous Once [name], MD 94 mL/hr at 04/28/21 1202 650 mg at 04/28/21 1202   sodium chloride flush (NS) 0.9 % injection 10 mL   10 mL Intravenous PRN [name], MD   10 mL at 02/18/21 0858   sodium chloride flush (NS) 0.9 % injection 10 mL  10 mL Intravenous Once Borders, Vonna Kotyk R, NP       sodium chloride flush (NS) 0.9 % injection 10 mL  10 mL Intravenous Once [name], MD         PHYSICAL EXAMINATION: ECOG PERFORMANCE STATUS: 1 - Symptomatic but completely ambulatory Vitals:   04/28/21 0840  BP: 110/78  Pulse: 64  Temp: 98.5 F (36.9 C)   Filed Weights   04/28/21 0840  Weight: 133 lb 14.4 oz (60.7 kg)    Physical Exam Constitutional:      General: She is not in acute distress. HENT:     Head: Normocephalic and atraumatic.  Eyes:     General: No scleral icterus. Cardiovascular:     Rate and Rhythm: Normal rate and regular rhythm.     Heart sounds: Normal heart sounds.  Pulmonary:     Effort: Pulmonary effort is normal. No respiratory distress.     Breath sounds: No wheezing.  Abdominal:     General: Bowel sounds are normal. There is no distension.     Palpations: Abdomen is soft.  Musculoskeletal:        General: No deformity. Normal range of motion.     Cervical back: Normal range of motion and neck supple.  Skin:    General: Skin is warm and dry.     Coloration: Skin is not jaundiced.     Findings: No erythema or rash.  Neurological:     Mental Status: She is alert and oriented to person, place, and time. Mental status is at baseline.     Cranial Nerves: No cranial nerve deficit.     Coordination: Coordination normal.  Psychiatric:        Mood and Affect: Mood normal.    LABORATORY DATA:  I have reviewed the data as listed Lab Results  Component Value Date   WBC 5.2 04/28/2021   HGB 12.7 04/28/2021   HCT 37.7 04/28/2021   MCV 98.2 04/28/2021   PLT 164 04/28/2021   Recent Labs    03/20/21 1444 03/25/21 0813 04/28/21 0807  NA 136  140 141  K 3.9 3.8 4.2  CL 100 102 106  CO2 _0 GLUCOSE 102* 91 75  BUN _1 CREATININE 0.63 0.79 0.67  CALCIUM 8.9 9.2 9.0   GFRNONAA >60 >60 >60  PROT 6.8 7.0 6.9  ALBUMIN 3.8 3.9 4.0  AST 50* 30 22  ALT 203* 77* 25  ALKPHOS 383* 332* 122  BILITOT 0.5 0.7 0.2*    Iron/TIBC/Ferritin/ %Sat No results found for: IRON, TIBC, FERRITIN, IRONPCTSAT    RADIOGRAPHIC STUDIES: I have personally reviewed the radiological images as listed and agreed with the findings in the report. Reviewed findings of MRI abdomen MRCP done at [facility]. US Abdomen Complete  Result Date: 03/19/2021 CLINICAL DATA:  Abdominal pain and transaminitis. History of pancreas cancer. EXAM: ABDOMEN ULTRASOUND COMPLETE COMPARISON:  MRI 06/11/2020 FINDINGS: Gallbladder: Gallbladder sludge noted. No gallstones or pericholecystic fluid. Mild diffuse gallbladder wall thickening measuring between 3 and 5 mm. Negative sonographic Murphy's sign. Common bile duct: Diameter: Measures 8.5 mm proximally and up to 17 mm near the head of pancreas. Known common bile duct stent is difficult to visualize. Liver: Intrahepatic bile duct dilatation. Normal parenchymal echogenicity. No mass. Portal vein is patent on color Doppler imaging with normal direction of blood flow towards the liver. IVC: No abnormality visualized. Pancreas: The head of the pancreas is prominent compatible with the history of known pancreatic adenocarcinoma. Spleen: Size and appearance within normal limits. Right Kidney: Length: 10.3 cm. Echogenicity within normal limits. No mass or hydronephrosis visualized. Left Kidney: Length: 10.0 cm. Echogenicity within normal limits. No mass or hydronephrosis visualized. Abdominal aorta: No aneurysm visualized. Other findings: None. IMPRESSION: 1. Intrahepatic and extrahepatic bile duct dilatation. This is similar to the CT from 10/30/2020. Known common bile duct stent is difficult to visualize. 2. Gallbladder sludge with mild diffuse gallbladder wall thickening. No gallstones, pericholecystic fluid, or sonographic Murphy's sign. Electronically Signed   By: Kerby Moors M.D.   On: 03/19/2021 10:22   IR CV Line Injection  Result Date: 04/28/2021 CLINICAL DATA:  no blood return EXAM: PORT  CATHETER INJECTION UNDER FLUOROSCOPY TECHNIQUE: The procedure, risks (including but not limited to bleeding, infection, organ damage ), benefits, and alternatives were explained to the patient. Questions regarding the procedure were encouraged and answered. The patient understands and consents to the procedure. Survey fluoroscopic inspection reveals good position of right IJ power injectable port catheter, tip in the proximal right atrium. No catheter discontinuity or fracture. Injection demonstrates patency of the system. No extravasation. No evidence of fibrin sheath. Contrast flows freely into the right atrium. IMPRESSION: 1. Normal appearance and function of right IJ power injectable port catheter. Okay for routine use. Consider Cathflo dwell per protocol if aspiration issues persist. Electronically Signed   By: Lucrezia Europe M.D.   On: 04/28/2021 11:20      ASSESSMENT & PLAN:  1. 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 Primary pancreatic cancer ([place])   4. 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 I also had a discussion with patient's surgeon Dr. [name] as well as patient's [facility] oncologist Dr. Oralia Rud. Given that patient has residual cancer in the liver specimen, Dr.  [name] will hold off the resection of the primary for now.  If patient is able to stay disease-free in 6 months, she may be able to be offered resection of the primary cancer. I discussed  about the potential of chemo switch. Option 1 is to continue FOLFIRI  and close monitoring her CA 19-9, will switch if tumor marker started to trend up.  Anticipate that she may develop resistance to FOLFIRI in the near future given that she has been on this regimen for fairly long time.  We also discussed about potential liver toxicity from prolonged use of Irinotecan. Option 2 is to switch to gem and Abraxane now, hoping that her cancer is sensitive to this regimen and potentially be able to help her to stay in disease-free condition.  Potential neuropathy is a concern as patient currently has had some chronic neuropathy secondary to previous oxaliplatin treatments. Discussed these with Dr. [name] and Dr. Oralia Rud.  We will agree that no option is known to be better than the other.  Both are reasonable options. I had a lengthy discussion with patient and her husband. Patient prefers to proceed with FOLFIRI at this point given that she has been doing fairly well and tolerating the regimen.  We will close monitor and switch in the future if needed.  CA 19-9 was done today results is pending.   Acute transaminitis, 2 secondary COVID19 or Paxlvoid. This has completely resolved.  pancreatic insufficiency.  Continue Creon,continue  24000 units TID with meals.  Continue.  Refills were sent.  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   Patient follows up with neurology for management.Marland Kitchen   #Elevated CEA,-this may be related to her pancreatic cancer.  Patient is due for colonoscopy and I recommend patient to follow-up with gastroenterology  #Port-A-Cath in place, this morning patient has no blood return from her port. Dye study was done which showed no fibrin sheath.    All questions were answered. The patient knows to call the clinic with any problems questions or concerns.  Return of visit: 2 weeks [name], MD, PhD  04/28/2021

## 2021-04-28 NOTE — Patient Instructions (Signed)
[facility] ONCOL[facility]  Discharge Instructions: Thank you for choosing [facility] to provide your oncology and hematology care.  If you have a lab appointment with the [facility], please go directly to the [facility] and check in at the registration area.  Wear comfortable clothing and clothing appropriate for easy access to any Portacath or PICC line.   We strive to give you quality time with your provider. You may need to reschedule your appointment if you arrive late (15 or more minutes).  Arriving late affects you and other patients whose appointments are after yours.  Also, if you miss three or more appointments without notifying the office, you may be dismissed from the clinic at the provider's discretion.      For prescription refill requests, have your pharmacy contact our office and allow 72 hours for refills to be completed.    Today you received the following chemotherapy and/or immunotherapy agents Irinotecan, Leucovorin and Adrucil        To help prevent nausea and vomiting after your treatment, we encourage you to take your nausea medication as directed.  BELOW ARE SYMPTOMS THAT SHOULD BE REPORTED IMMEDIATELY: *FEVER GREATER THAN 100.4 F (38 C) OR HIGHER *CHILLS OR SWEATING *NAUSEA AND VOMITING THAT IS NOT CONTROLLED WITH YOUR NAUSEA MEDICATION *UNUSUAL SHORTNESS OF BREATH *UNUSUAL BRUISING OR BLEEDING *URINARY PROBLEMS (pain or burning when urinating, or frequent urination) *BOWEL PROBLEMS (unusual diarrhea, constipation, pain near the anus) TENDERNESS IN MOUTH AND THROAT WITH OR WITHOUT PRESENCE OF ULCERS (sore throat, sores in mouth, or a toothache) UNUSUAL RASH, SWELLING OR PAIN  UNUSUAL VAGINAL DISCHARGE OR ITCHING   Items with * indicate a potential emergency and should be followed up as soon as possible or go to the Emergency Department if any problems should occur.  Please show the CHEMOTHERAPY ALERT CARD or  IMMUNOTHERAPY ALERT CARD at check-in to the Emergency Department and triage nurse.  Should you have questions after your visit or need to cancel or reschedule your appointment, please contact [place]  [phone] and follow the prompts.  Office hours are 8:00 a.m. to 4:30 p.m. Monday - Friday. Please note that voicemails left after 4:00 p.m. may not be returned until the following business day.  We are closed weekends and major holidays. You have access to a nurse at all times for urgent questions. Please call the main number to the clinic [phone] and follow the prompts.  For any non-urgent questions, you may also contact your provider using MyChart. We now offer e-Visits for anyone 95 and older to request care online for non-urgent symptoms. For details visit [email or web address].   Also download the MyChart app! Go to the app store, search "MyChart", open the app, select [facility], and log in with your MyChart username and password.  Due to Covid, a mask is required upon entering the hospital/clinic. If you do not have a mask, one will be given to you upon arrival. For doctor visits, patients may have 1 support person aged 63 or older with them. For treatment visits, patients cannot have anyone with them due to current Covid guidelines and our immunocompromised population.

## 2021-04-29 LAB — CANCER ANTIGEN 19-9: CA 19-9: 7 U/mL (ref 0–35)

## 2021-04-30 ENCOUNTER — Inpatient Hospital Stay: Payer: Medicare Other

## 2021-04-30 ENCOUNTER — Other Ambulatory Visit: Payer: Self-pay

## 2021-04-30 DIAGNOSIS — Z8616 Personal history of COVID-19: Secondary | ICD-10-CM | POA: Diagnosis not present

## 2021-04-30 DIAGNOSIS — C259 Malignant neoplasm of pancreas, unspecified: Secondary | ICD-10-CM

## 2021-04-30 DIAGNOSIS — Z87891 Personal history of nicotine dependence: Secondary | ICD-10-CM | POA: Diagnosis not present

## 2021-04-30 DIAGNOSIS — K8689 Other specified diseases of pancreas: Secondary | ICD-10-CM | POA: Diagnosis not present

## 2021-04-30 DIAGNOSIS — Z79899 Other long term (current) drug therapy: Secondary | ICD-10-CM | POA: Diagnosis not present

## 2021-04-30 DIAGNOSIS — T451X5A Adverse effect of antineoplastic and immunosuppressive drugs, initial encounter: Secondary | ICD-10-CM | POA: Diagnosis not present

## 2021-04-30 DIAGNOSIS — Z8262 Family history of osteoporosis: Secondary | ICD-10-CM | POA: Diagnosis not present

## 2021-04-30 DIAGNOSIS — C787 Secondary malignant neoplasm of liver and intrahepatic bile duct: Secondary | ICD-10-CM | POA: Diagnosis present

## 2021-04-30 DIAGNOSIS — G620 Drug-induced polyneuropathy: Secondary | ICD-10-CM | POA: Diagnosis not present

## 2021-04-30 DIAGNOSIS — Z9049 Acquired absence of other specified parts of digestive tract: Secondary | ICD-10-CM | POA: Diagnosis not present

## 2021-04-30 DIAGNOSIS — R7989 Other specified abnormal findings of blood chemistry: Secondary | ICD-10-CM | POA: Diagnosis not present

## 2021-04-30 DIAGNOSIS — Z452 Encounter for adjustment and management of vascular access device: Secondary | ICD-10-CM | POA: Diagnosis not present

## 2021-04-30 DIAGNOSIS — Z8349 Family history of other endocrine, nutritional and metabolic diseases: Secondary | ICD-10-CM | POA: Diagnosis not present

## 2021-04-30 DIAGNOSIS — Z833 Family history of diabetes mellitus: Secondary | ICD-10-CM | POA: Diagnosis not present

## 2021-04-30 DIAGNOSIS — D6481 Anemia due to antineoplastic chemotherapy: Secondary | ICD-10-CM | POA: Diagnosis not present

## 2021-04-30 DIAGNOSIS — Z809 Family history of malignant neoplasm, unspecified: Secondary | ICD-10-CM | POA: Diagnosis not present

## 2021-04-30 DIAGNOSIS — Z5111 Encounter for antineoplastic chemotherapy: Secondary | ICD-10-CM | POA: Diagnosis present

## 2021-04-30 DIAGNOSIS — R970 Elevated carcinoembryonic antigen [CEA]: Secondary | ICD-10-CM | POA: Diagnosis not present

## 2021-04-30 DIAGNOSIS — Z803 Family history of malignant neoplasm of breast: Secondary | ICD-10-CM | POA: Diagnosis not present

## 2021-04-30 MED ORDER — SODIUM CHLORIDE 0.9% FLUSH
10.0000 mL | INTRAVENOUS | Status: DC | PRN
  Administered 2021-04-30: 10 mL
  Filled 2021-04-30: qty 10

## 2021-04-30 MED ORDER — HEPARIN SOD (PORK) LOCK FLUSH 100 UNIT/ML IV SOLN
500.0000 [IU] | Freq: Once | INTRAVENOUS | Status: AC | PRN
  Administered 2021-04-30: 500 [IU]
  Filled 2021-04-30: qty 5

## 2021-05-12 ENCOUNTER — Inpatient Hospital Stay: Payer: Medicare Other

## 2021-05-12 ENCOUNTER — Encounter: Payer: Self-pay | Admitting: Oncology

## 2021-05-12 ENCOUNTER — Other Ambulatory Visit: Payer: Self-pay

## 2021-05-12 ENCOUNTER — Inpatient Hospital Stay (HOSPITAL_BASED_OUTPATIENT_CLINIC_OR_DEPARTMENT_OTHER): Payer: Medicare Other | Admitting: Oncology

## 2021-05-12 VITALS — Resp 18

## 2021-05-12 VITALS — BP 126/82 | HR 60 | Temp 97.3°F | Wt 135.1 lb

## 2021-05-12 DIAGNOSIS — Z5111 Encounter for antineoplastic chemotherapy: Secondary | ICD-10-CM

## 2021-05-12 DIAGNOSIS — R970 Elevated carcinoembryonic antigen [CEA]: Secondary | ICD-10-CM

## 2021-05-12 DIAGNOSIS — Z95828 Presence of other vascular implants and grafts: Secondary | ICD-10-CM

## 2021-05-12 DIAGNOSIS — K8689 Other specified diseases of pancreas: Secondary | ICD-10-CM | POA: Diagnosis not present

## 2021-05-12 DIAGNOSIS — C259 Malignant neoplasm of pancreas, unspecified: Secondary | ICD-10-CM

## 2021-05-12 DIAGNOSIS — T451X5A Adverse effect of antineoplastic and immunosuppressive drugs, initial encounter: Secondary | ICD-10-CM

## 2021-05-12 DIAGNOSIS — G620 Drug-induced polyneuropathy: Secondary | ICD-10-CM | POA: Diagnosis not present

## 2021-05-12 LAB — COMPREHENSIVE METABOLIC PANEL
ALT: 29 U/L (ref 0–44)
AST: 28 U/L (ref 15–41)
Albumin: 4.1 g/dL (ref 3.5–5.0)
Alkaline Phosphatase: 112 U/L (ref 38–126)
Anion gap: 11 (ref 5–15)
BUN: 19 mg/dL (ref 8–23)
CO2: 25 mmol/L (ref 22–32)
Calcium: 8.8 mg/dL — ABNORMAL LOW (ref 8.9–10.3)
Chloride: 104 mmol/L (ref 98–111)
Creatinine, Ser: 0.68 mg/dL (ref 0.44–1.00)
GFR, Estimated: >60 mL/min (ref >60)
Glucose, Bld: 81 mg/dL (ref 70–99)
Potassium: 4 mmol/L (ref 3.5–5.1)
Sodium: 140 mmol/L (ref 135–145)
Total Bilirubin: 0.4 mg/dL (ref 0.3–1.2)
Total Protein: 6.9 g/dL (ref 6.5–8.1)

## 2021-05-12 LAB — CBC WITH DIFFERENTIAL/PLATELET
Abs Immature Granulocytes: 0.01 10*3/uL (ref 0.00–0.07)
Basophils Absolute: 0 10*3/uL (ref 0.0–0.1)
Basophils Relative: 1 %
Eosinophils Absolute: 0.2 10*3/uL (ref 0.0–0.5)
Eosinophils Relative: 5 %
HCT: 35.5 % — ABNORMAL LOW (ref 36.0–46.0)
Hemoglobin: 12.4 g/dL (ref 12.0–15.0)
Immature Granulocytes: 0 %
Lymphocytes Relative: 33 %
Lymphs Abs: 1.5 10*3/uL (ref 0.7–4.0)
MCH: 33.5 pg (ref 26.0–34.0)
MCHC: 34.9 g/dL (ref 30.0–36.0)
MCV: 95.9 fL (ref 80.0–100.0)
Monocytes Absolute: 0.5 10*3/uL (ref 0.1–1.0)
Monocytes Relative: 11 %
Neutro Abs: 2.3 10*3/uL (ref 1.7–7.7)
Neutrophils Relative %: 50 %
Platelets: 168 10*3/uL (ref 150–400)
RBC: 3.7 MIL/uL — ABNORMAL LOW (ref 3.87–5.11)
RDW: 12.1 % (ref 11.5–15.5)
WBC: 4.4 10*3/uL (ref 4.0–10.5)
nRBC: 0 % (ref 0.0–0.2)

## 2021-05-12 MED ORDER — PROCHLORPERAZINE MALEATE 10 MG PO TABS: 10.0000 mg | ORAL_TABLET | Freq: Four times a day (QID) | ORAL | 1 refills | Status: DC | PRN

## 2021-05-12 MED ORDER — ATROPINE SULFATE 1 MG/ML IV SOLN
0.5000 mg | Freq: Once | INTRAVENOUS | Status: AC
  Administered 2021-05-12: 0.5 mg via INTRAVENOUS
  Filled 2021-05-12: qty 1

## 2021-05-12 MED ORDER — DIPHENHYDRAMINE HCL 25 MG PO CAPS: 25.0000 mg | ORAL_CAPSULE | Freq: Once | ORAL | Status: DC

## 2021-05-12 MED ORDER — SODIUM CHLORIDE 0.9 % IV SOLN
150.0000 mg | Freq: Once | INTRAVENOUS | Status: AC
  Administered 2021-05-12: 150 mg via INTRAVENOUS
  Filled 2021-05-12: qty 150

## 2021-05-12 MED ORDER — SODIUM CHLORIDE 0.9 % IV SOLN
650.0000 mg | Freq: Once | INTRAVENOUS | Status: AC
  Administered 2021-05-12: 650 mg via INTRAVENOUS
  Filled 2021-05-12: qty 32.5

## 2021-05-12 MED ORDER — SODIUM CHLORIDE 0.9 % IV SOLN
150.0000 mg/m2 | Freq: Once | INTRAVENOUS | Status: AC
  Administered 2021-05-12: 260 mg via INTRAVENOUS
  Filled 2021-05-12: qty 10

## 2021-05-12 MED ORDER — SODIUM CHLORIDE 0.9 % IV SOLN
Freq: Once | INTRAVENOUS | Status: AC
  Filled 2021-05-12: qty 250

## 2021-05-12 MED ORDER — SODIUM CHLORIDE 0.9% FLUSH
10.0000 mL | INTRAVENOUS | Status: DC | PRN
  Administered 2021-05-12 (×2): 10 mL
  Filled 2021-05-12: qty 10

## 2021-05-12 MED ORDER — SODIUM CHLORIDE 0.9 % IV SOLN
10.0000 mg | Freq: Once | INTRAVENOUS | Status: AC
  Administered 2021-05-12: 10 mg via INTRAVENOUS
  Filled 2021-05-12: qty 10

## 2021-05-12 MED ORDER — SODIUM CHLORIDE 0.9 % IV SOLN
2400.0000 mg/m2 | INTRAVENOUS | Status: DC
  Administered 2021-05-12: 4000 mg via INTRAVENOUS
  Filled 2021-05-12: qty 80

## 2021-05-12 MED ORDER — SODIUM CHLORIDE 0.9% FLUSH
10.0000 mL | Freq: Once | INTRAVENOUS | Status: AC
  Administered 2021-05-12: 10 mL via INTRAVENOUS
  Filled 2021-05-12: qty 10

## 2021-05-12 MED ORDER — PALONOSETRON HCL INJECTION 0.25 MG/5ML
0.2500 mg | Freq: Once | INTRAVENOUS | Status: AC
  Administered 2021-05-12: 0.25 mg via INTRAVENOUS
  Filled 2021-05-12: qty 5

## 2021-05-12 MED ORDER — LIDOCAINE-PRILOCAINE 2.5-2.5 % EX CREA
TOPICAL_CREAM | CUTANEOUS | 3 refills | Status: DC
Start: 2021-05-12 — End: 2021-06-10

## 2021-05-12 MED ORDER — DIPHENHYDRAMINE HCL 25 MG PO CAPS
25.0000 mg | ORAL_CAPSULE | Freq: Once | ORAL | Status: AC
  Administered 2021-05-12: 25 mg via ORAL
  Filled 2021-05-12: qty 1

## 2021-05-12 NOTE — Patient Instructions (Signed)
[facility] ONCOLOGY   Discharge Instructions: Thank you for choosing [facility] to provide your oncology and hematology care.  If you have a lab appointment with the [facility], please go directly to the [facility] and check in at the registration area.  Wear comfortable clothing and clothing appropriate for easy access to any Portacath or PICC line.   We strive to give you quality time with your provider. You may need to reschedule your appointment if you arrive late (15 or more minutes).  Arriving late affects you and other patients whose appointments are after yours.  Also, if you miss three or more appointments without notifying the office, you may be dismissed from the clinic at the provider's discretion.      For prescription refill requests, have your pharmacy contact our office and allow 72 hours for refills to be completed.    Today you received the following chemotherapy and/or immunotherapy agents: Irinotecan, Leucovorin, and Fluorouracil.      To help prevent nausea and vomiting after your treatment, we encourage you to take your nausea medication as directed.  BELOW ARE SYMPTOMS THAT SHOULD BE REPORTED IMMEDIATELY: *FEVER GREATER THAN 100.4 F (38 C) OR HIGHER *CHILLS OR SWEATING *NAUSEA AND VOMITING THAT IS NOT CONTROLLED WITH YOUR NAUSEA MEDICATION *UNUSUAL SHORTNESS OF BREATH *UNUSUAL BRUISING OR BLEEDING *URINARY PROBLEMS (pain or burning when urinating, or frequent urination) *BOWEL PROBLEMS (unusual diarrhea, constipation, pain near the anus) TENDERNESS IN MOUTH AND THROAT WITH OR WITHOUT PRESENCE OF ULCERS (sore throat, sores in mouth, or a toothache) UNUSUAL RASH, SWELLING OR PAIN  UNUSUAL VAGINAL DISCHARGE OR ITCHING   Items with * indicate a potential emergency and should be followed up as soon as possible or go to the Emergency Department if any problems should occur.  Please show the CHEMOTHERAPY ALERT CARD or  IMMUNOTHERAPY ALERT CARD at check-in to the Emergency Department and triage nurse.  Should you have questions after your visit or need to cancel or reschedule your appointment, please contact [facility]  [phone] and follow the prompts.  Office hours are 8:00 a.m. to 4:30 p.m. Monday - Friday. Please note that voicemails left after 4:00 p.m. may not be returned until the following business day.  We are closed weekends and major holidays. You have access to a nurse at all times for urgent questions. Please call the main number to the clinic [phone] and follow the prompts.  For any non-urgent questions, you may also contact your provider using MyChart. We now offer e-Visits for anyone 52 and older to request care online for non-urgent symptoms. For details visit [email or web address].   Also download the MyChart app! Go to the app store, search "MyChart", open the app, select [facility], and log in with your MyChart username and password.  Due to Covid, a mask is required upon entering the hospital/clinic. If you do not have a mask, one will be given to you upon arrival. For doctor visits, patients may have 1 support person aged 44 or older with them. For treatment visits, patients cannot have anyone with them due to current Covid guidelines and our immunocompromised population.

## 2021-05-12 NOTE — Progress Notes (Signed)
Hematology/Oncology follow up note [facility] Telephone:(336) [phone] Fax:(336) [phone]   Patient Care Team: [name], MD as PCP - General (Family Medicine) Flinchum, Kelby Aline, FNP (Family Medicine) [name], RN as Oncology Nurse Navigator [name], MD as Consulting Physician (Oncology)  REFERRING PROVIDER: [name], MD  CHIEF COMPLAINTS/REASON FOR VISIT:  Follow up for treatment of pancreatic adenocarcinoma  HISTORY OF PRESENTING ILLNESS:   Cassandra [name] is a  65 y.o.  female with PMH listed below was seen in consultation at the request of  [name], MD  for evaluation of pancreatic adenocarcinoma Patient initially presented with jaundice, transaminitis, bilirubin was 9.9.  CA 19-9 was 1874.  Patient also reports unintentional weight loss. 02/08/2020 MRI abdomen and MRCP with and without contrast was done at [facility] which showed pancreatic head mass measuring up to 3 cm, with marked associated narrowing of the portal vein confluence.  SMA is preserved.  Marked intrahepatic and extrahepatic biliary duct dilatation as well as mild dilatation of the main pancreatic duct. Multiple hepatic masses highly concerning for metastatic disease.  Patient underwent EUS on 02/19/2020, which showed irregular mass identified in the pancreatic head, hypoechoic, measured 47mx33mm, sonographic evidence concerning for invasion into the superior mesenteric artery.  There is no sign of significant abnormality in the main pancreatic duct.  Dilatation of common bile duct which measured up to 16 mm.  Region of celiac artery was visualized and showed no signs of significant abnormality.  No lymphadenopathy.  FNA showed adenocarcinoma.  02/19/2020, ERCP, malignant.  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  The stricture was treated with placement of wall flex metal stent.  [facility] ZPia Mauand was recommended for 3 drug regimen FOLFIRINOX.  [facility]  Patient was referred to establish care today. She denies any pain.  Since stent placement, skin jaundice has improved.  Itchiness has also improved. Patient was accompanied by her husband today.  She has a family history of breast cancer in sister and paternal aunt, colon cancer paternal grandmother.  #no reportable targetable mutation on NGS 9/14/2021cycle 1 FOLFIRINOX.  Patient received oxaliplatin and about 50% of Irinotecan on day 1 and had experienced neurologic symptoms.  She went to ER and working diagnosis is TIA, and eventually I think this is due to irinotecan side effects -irinotecan-associated dysarthria, lip/tongue numbness.  Adjustment was made for Irinotecan to be  infused over 180 minutes.  Atropine 0.5 mg once prior to the irinotecan. No recurrent symptoms.   # 03/19/2020-03/21/2020 patient was admitted due to sepsis with strep pneumonia bacteremia.  Patient was treated with IV Rocephin.  TEE was done which showed no vegetation.  No PFO or ASD.  Patient was discharged home and he finished full course of 14 days of IV Rocephin on 04/02/2020 per ID recommendation.  Repeat blood culture was also negative.  08/25/20 cycle 10 FOLFIRINOX 09/08/20- present  Starting cycle 11, FOLFIRI, Oxaliplatin discontinued due to neuropathy   #NGS showed no reportable targetable mutation #Genetic testing-[employer] diagnostic testing showed no pathological variants identified. MS stable, TMB 070m/mb, KRAS G12D, SF3B1 K700E, TP53 [id number]  #01/14/2021  [facility].  # Her [facility] [[place] Scrapere 4~5 liver lesions on OSH MRI last year at the time of diagnosis highly suspicious  for metastasis. She is not eligible for surgical protocol for metastasis resection given the number of liver metastasis  is >3. However given her excellent and durable response to chemo, surgical resection may be considered pending sustained disease control at 1 year and may require partial hepatectomy first to see if there is viable tumor before proceeding to Whipple resection ]  Patient had a COVID-19 infection in September 2022.  03/23/2021 CT abdomen pelvis No significant change in the ill-defined pancreatic head mass or  associated biliary ductal and pancreatic ductal dilation.  Slight interval enlargement of subcentimeter anterior peripancreatic  lymph node, nonspecific. Attention on follow-up per clinical protocol. Similar enlargement of the ascending thoracic aorta measuring 4.4 cm  03/23/2021 MRI abdomen w and wo  Increasing ill-defined hypoenhancement of the pancreatic head surrounding the common bile duct stent, in keeping with known pancreatic malignancy. Anterior peripancreatic lymph node is better evaluated on CT.  Unchanged position of a common bile duct stent with similar mild diffuse intrahepatic ductal dilation and pneumobilia. No evidence of metastatic disease in the abdomen or pelvis. Partially visualized cystic left adnexal lesion measuring up to 3.9 cm.   04/10/2021, patient underwent liver resection at [facility], by Dr. [name].   Pathology report from [facility] was reviewed. Segments 3 partial hepatectomy, negative for viable tumor. Section 5/6 partial hepatectomy part 1 and 2, microscopic foci of residual viable adenocarcinoma, morphologically consistent with history of pancreatic primary.  Parenchymal margin is uninvolved.  #Patient resumed on FOLFIRI on 04/28/2021.  INTERVAL HISTORY Cassandra [name] is a 65 y.o. female who has above history reviewed by me today presents for pancreatic cancer.  Patient tolerated FOLFIRI treatments well.  She has had some nausea and took antiemetics which relieved her symptoms. She has gained weight.  She is on Creon.  Review of Systems  Constitutional:  Negative for  appetite change, chills, fatigue, fever and unexpected weight change.  HENT:   Negative for hearing loss and voice change.   Eyes:  Negative for eye problems.  Respiratory:  Negative for chest tightness and cough.   Cardiovascular:  Negative for chest pain.  Gastrointestinal:  Negative for abdominal distention, abdominal pain and blood in stool.  Endocrine: Negative for hot flashes.  Genitourinary:  Negative for difficulty urinating and frequency.   Musculoskeletal:  Negative for arthralgias.  Skin:  Negative for itching and rash.  Neurological:  Positive for numbness. Negative for extremity weakness.  Hematological:  Negative for adenopathy.  Psychiatric/Behavioral:  Negative for confusion.    MEDICAL HISTORY:  Past Medical History:  Diagnosis Date   Cancer [facility])    pancreatic cancer   Colon polyps    Family history of breast cancer    Neutropenia ([place]) 04/18/2020   Osteopenia after menopause 05/2017   femoral neck T score -2.0   Personal history of chemotherapy    current for pancreatic ca    SURGICAL HISTORY: Past Surgical History:  Procedure Laterality Date   COLONOSCOPY  07/2015   WNL   COLONOSCOPY  2008/2011   IR CV LINE INJECTION  04/28/2021   OVARIAN CYST REMOVAL  1992   dermoid-Dr CAK   PORTA CATH INSERTION N/A 03/07/2020   Procedure: PORTA CATH INSERTION;  Surgeon: Algernon Huxley, MD;  Location: [facility] CV LAB;  Service: Cardiovascular;  Laterality: N/A;   TEE WITHOUT CARDIOVERSION N/A 03/21/2020   Procedure: TRANSESOPHAGEAL ECHOCARDIOGRAM (TEE);  Surgeon: Dionisio David, MD;  Location: [facility] ORS;  Service: Cardiovascular;  Laterality: N/A;   TUBAL  LIGATION  1993    SOCIAL HISTORY: Social History   Socioeconomic History   Marital status: Married    Spouse name: Not on file   Number of children: 2   Years of education: Not on file   Highest education level: Not on file  Occupational History   Occupation: Pharmacist, hospital    Comment: retired   Occupation:  Psychologist, sport and exercise  Tobacco Use   Smoking status: Former    Packs/day: 0.50    Years: 10.00    Pack years: 5.00    Types: Cigarettes    Quit date: 1987    Years since quitting: 35.8   Smokeless tobacco: Never   Tobacco comments:    Quit smoking 1987  Vaping Use   Vaping Use: Never used  Substance and Sexual Activity   Alcohol use: Not Currently    Comment: 0-2 mixed drinks a day   Drug use: No   Sexual activity: Not Currently    Birth control/protection: Post-menopausal  Other Topics Concern   Not on file  Social History Narrative   Not on file   Social Determinants of Health   Financial Resource Strain: Not on file  Food Insecurity: Not on file  Transportation Needs: Not on file  Physical Activity: Not on file  Stress: Not on file  Social Connections: Not on file  Intimate Partner Violence: Not on file    FAMILY HISTORY: Family History  Problem Relation Age of Onset   Breast cancer Paternal Aunt 17   Diabetes Mother    Osteoporosis Mother    Hyperlipidemia Father    Rheumatic fever Father    Valvular heart disease Father    Cancer Paternal Grandmother        possible colon   Breast cancer Sister 38    ALLERGIES:  is allergic to penicillin g.  MEDICATIONS:  Current Outpatient Medications  Medication Sig Dispense Refill   Calcium Carbonate-Vit D-Min (CALCIUM 1200 PO) Take by mouth.     Cholecalciferol 25 MCG (1000 UT) tablet Take 2,000 Units by mouth daily.     Cyanocobalamin (VITAMIN B 12 PO) Take by mouth.     Multiple Vitamins-Minerals (MULTIVITAMIN WITH MINERALS) tablet Take 1 tablet by mouth daily.     Pancrelipase, Lip-Prot-Amyl, (CREON) 24000-76000 units CPEP Take 1 capsule (24,000 Units total) by mouth 3 (three) times daily before meals. 90 capsule 3   simethicone (GAS-X) 80 MG chewable tablet Chew 1 tablet (80 mg total) by mouth every 8 (eight) hours as needed for flatulence. 60 tablet 0   lidocaine-prilocaine (EMLA) cream Apply to affected area once 30 g 3    loperamide (IMODIUM A-D) 2 MG tablet Take 2 at onset of diarrhea, then 1 every 2hrs until 12hr without a BM. May take 2 tab every 4hrs at bedtime. If diarrhea recurs repeat. (Patient not taking: No sig reported) 100 tablet 1   magic mouthwash w/lidocaine SOLN Take 5 mLs by mouth 4 (four) times daily as needed for mouth pain. Sig: Swish & Spit 5-10 ml four times a day as needed. Dispense 480 ml. 1RF (Patient not taking: No sig reported) 480 mL 1   ondansetron (ZOFRAN) 8 MG tablet Take 1 tablet (8 mg total) by mouth 2 (two) times daily as needed. Start on day 3 after chemotherapy. (Patient not taking: No sig reported) 30 tablet 1   potassium chloride SA (KLOR-CON M20) 20 MEQ tablet Take 1 tablet (20 mEq total) by mouth daily. (Patient not taking: No sig reported) 30  tablet 0   prochlorperazine (COMPAZINE) 10 MG tablet Take 1 tablet (10 mg total) by mouth every 6 (six) hours as needed (Nausea or vomiting). 60 tablet 1   No current facility-administered medications for this visit.   Facility-Administered Medications Ordered in Other Visits  Medication Dose Route Frequency Provider Last Rate Last Admin   diphenhydrAMINE (BENADRYL) capsule 25 mg  25 mg Oral Once [name], MD       fluorouracil (ADRUCIL) 4,000 mg in sodium chloride 0.9 % 70 mL chemo infusion  2,400 mg/m2 (Order-Specific) Intravenous 1 day or 1 dose [name], MD   4,000 mg at 05/12/21 1330   sodium chloride flush (NS) 0.9 % injection 10 mL  10 mL Intravenous PRN [name], MD   10 mL at 02/18/21 0858   sodium chloride flush (NS) 0.9 % injection 10 mL  10 mL Intravenous Once Borders, Kirt Boys, NP       sodium chloride flush (NS) 0.9 % injection 10 mL  10 mL Intravenous Once [name], MD       sodium chloride flush (NS) 0.9 % injection 10 mL  10 mL Intracatheter PRN [name], MD   10 mL at 05/12/21 1330     PHYSICAL EXAMINATION: ECOG PERFORMANCE STATUS: 0 - Asymptomatic Vitals:   05/12/21 0834  BP: 126/82  Pulse: 60  Temp: (!) 97.3 F  (36.3 C)   Filed Weights   05/12/21 0834  Weight: 135 lb 1.6 oz (61.3 kg)    Physical Exam Constitutional:      General: She is not in acute distress. HENT:     Head: Normocephalic and atraumatic.  Eyes:     General: No scleral icterus. Cardiovascular:     Rate and Rhythm: Normal rate and regular rhythm.     Heart sounds: Normal heart sounds.  Pulmonary:     Effort: Pulmonary effort is normal. No respiratory distress.     Breath sounds: No wheezing.  Abdominal:     General: Bowel sounds are normal. There is no distension.     Palpations: Abdomen is soft.  Musculoskeletal:        General: No deformity. Normal range of motion.     Cervical back: Normal range of motion and neck supple.  Skin:    General: Skin is warm and dry.     Coloration: Skin is not jaundiced.     Findings: No erythema or rash.  Neurological:     Mental Status: She is alert and oriented to person, place, and time. Mental status is at baseline.     Cranial Nerves: No cranial nerve deficit.     Coordination: Coordination normal.  Psychiatric:        Mood and Affect: Mood normal.    LABORATORY DATA:  I have reviewed the data as listed Lab Results  Component Value Date   WBC 4.4 05/12/2021   HGB 12.4 05/12/2021   HCT 35.5 (L) 05/12/2021   MCV 95.9 05/12/2021   PLT 168 05/12/2021   Recent Labs    03/25/21 0813 04/28/21 0807 05/12/21 0809  NA 140 141 140  K 3.8 4.2 4.0  CL 102 106 104  CO2 $Re'28 28 25  'Nln$ GLUCOSE 91 75 81  BUN $Re'19 20 19  'wHK$ CREATININE 0.79 0.67 0.68  CALCIUM 9.2 9.0 8.8*  GFRNONAA >60 >60 >60  PROT 7.0 6.9 6.9  ALBUMIN 3.9 4.0 4.1  AST $Re'30 22 28  'TuH$ ALT 77* 25 29  ALKPHOS 332* 122 112  BILITOT 0.7 0.2* 0.4    Iron/TIBC/Ferritin/ %Sat No results found for: IRON, TIBC, FERRITIN, IRONPCTSAT    RADIOGRAPHIC STUDIES: I have personally reviewed the radiological images as listed and agreed with the findings in the report. Reviewed findings of MRI abdomen MRCP done at [facility]. US  Abdomen Complete  Result Date: 03/19/2021 CLINICAL DATA:  Abdominal pain and transaminitis. History of pancreas cancer. EXAM: ABDOMEN ULTRASOUND COMPLETE COMPARISON:  MRI 06/11/2020 FINDINGS: Gallbladder: Gallbladder sludge noted. No gallstones or pericholecystic fluid. Mild diffuse gallbladder wall thickening measuring between 3 and 5 mm. Negative sonographic Murphy's sign. Common bile duct: Diameter: Measures 8.5 mm proximally and up to 17 mm near the head of pancreas. Known common bile duct stent is difficult to visualize. Liver: Intrahepatic bile duct dilatation. Normal parenchymal echogenicity. No mass. Portal vein is patent on color Doppler imaging with normal direction of blood flow towards the liver. IVC: No abnormality visualized. Pancreas: The head of the pancreas is prominent compatible with the history of known pancreatic adenocarcinoma. Spleen: Size and appearance within normal limits. Right Kidney: Length: 10.3 cm. Echogenicity within normal limits. No mass or hydronephrosis visualized. Left Kidney: Length: 10.0 cm. Echogenicity within normal limits. No mass or hydronephrosis visualized. Abdominal aorta: No aneurysm visualized. Other findings: None. IMPRESSION: 1. Intrahepatic and extrahepatic bile duct dilatation. This is similar to the CT from 10/30/2020. Known common bile duct stent is difficult to visualize. 2. Gallbladder sludge with mild diffuse gallbladder wall thickening. No gallstones, pericholecystic fluid, or sonographic Murphy's sign. Electronically Signed   By: Kerby Moors M.D.   On: 03/19/2021 10:22   IR CV Line Injection  Result Date: 04/28/2021 CLINICAL DATA:  no blood return EXAM: PORT  CATHETER INJECTION UNDER FLUOROSCOPY TECHNIQUE: The procedure, risks (including but not limited to bleeding, infection, organ damage ), benefits, and alternatives were explained to the patient. Questions regarding the procedure were encouraged and answered. The patient understands and consents  to the procedure. Survey fluoroscopic inspection reveals good position of right IJ power injectable port catheter, tip in the proximal right atrium. No catheter discontinuity or fracture. Injection demonstrates patency of the system. No extravasation. No evidence of fibrin sheath. Contrast flows freely into the right atrium. IMPRESSION: 1. Normal appearance and function of right IJ power injectable port catheter. Okay for routine use. Consider Cathflo dwell per protocol if aspiration issues persist. Electronically Signed   By: Lucrezia Europe M.D.   On: 04/28/2021 11:20      ASSESSMENT & PLAN:  1. Primary pancreatic cancer ([place])   2. 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 Pancreatic insufficiency   5. 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  Pathology proved residual disease  Labs reviewed and discussed with patient. Proceed with FOLFIRI treatments today. CA 19 9 will be monitored every 2 weeks.  #pancreatic insufficiency.  Continue Creon,continue  24000 units TID with meals.  Continue current regimen.  Symptom has improved.  She has gained weight.  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   Patient follows up with neurology for management.Marland Kitchen   #Elevated CEA,-this may be related to her pancreatic cancer.  Patient is due for colonoscopy and I recommend patient to follow-up with gastroenterology  All questions were answered. The patient knows to call the clinic with any problems questions or concerns.  Return of visit: 2 weeks [name], MD, PhD  05/12/2021

## 2021-05-13 LAB — CANCER ANTIGEN 19-9: CA 19-9: 6 U/mL (ref 0–35)

## 2021-05-13 LAB — CEA: CEA: 6.1 ng/mL — ABNORMAL HIGH (ref 0.0–4.7)

## 2021-05-14 ENCOUNTER — Inpatient Hospital Stay: Payer: Medicare Other

## 2021-05-14 ENCOUNTER — Other Ambulatory Visit: Payer: Self-pay

## 2021-05-14 DIAGNOSIS — Z5111 Encounter for antineoplastic chemotherapy: Secondary | ICD-10-CM | POA: Diagnosis not present

## 2021-05-14 DIAGNOSIS — C259 Malignant neoplasm of pancreas, unspecified: Secondary | ICD-10-CM

## 2021-05-14 MED ORDER — HEPARIN SOD (PORK) LOCK FLUSH 100 UNIT/ML IV SOLN
500.0000 [IU] | Freq: Once | INTRAVENOUS | Status: AC | PRN
  Administered 2021-05-14: 13:00:00 500 [IU]
  Filled 2021-05-14: qty 5

## 2021-05-14 MED ORDER — SODIUM CHLORIDE 0.9% FLUSH
10.0000 mL | INTRAVENOUS | Status: DC | PRN
  Administered 2021-05-14: 13:00:00 10 mL
  Filled 2021-05-14: qty 10

## 2021-05-26 ENCOUNTER — Inpatient Hospital Stay (HOSPITAL_BASED_OUTPATIENT_CLINIC_OR_DEPARTMENT_OTHER): Payer: Medicare Other | Admitting: Oncology

## 2021-05-26 ENCOUNTER — Ambulatory Visit
Admission: RE | Admit: 2021-05-26 | Discharge: 2021-05-26 | Disposition: A | Discharge status: Home / Self Care | Payer: Medicare Other | Source: Ambulatory Visit | Attending: Oncology | Admitting: Oncology

## 2021-05-26 ENCOUNTER — Inpatient Hospital Stay: Payer: Medicare Other

## 2021-05-26 ENCOUNTER — Other Ambulatory Visit: Payer: Self-pay

## 2021-05-26 ENCOUNTER — Encounter: Payer: Self-pay | Admitting: Oncology

## 2021-05-26 VITALS — BP 121/74 | HR 90 | Temp 98.4°F | Wt 133.0 lb

## 2021-05-26 DIAGNOSIS — K8689 Other specified diseases of pancreas: Secondary | ICD-10-CM

## 2021-05-26 DIAGNOSIS — R945 Abnormal results of liver function studies: Secondary | ICD-10-CM

## 2021-05-26 DIAGNOSIS — G620 Drug-induced polyneuropathy: Secondary | ICD-10-CM | POA: Diagnosis not present

## 2021-05-26 DIAGNOSIS — C259 Malignant neoplasm of pancreas, unspecified: Secondary | ICD-10-CM

## 2021-05-26 DIAGNOSIS — Z5111 Encounter for antineoplastic chemotherapy: Secondary | ICD-10-CM

## 2021-05-26 DIAGNOSIS — T451X5A Adverse effect of antineoplastic and immunosuppressive drugs, initial encounter: Secondary | ICD-10-CM

## 2021-05-26 DIAGNOSIS — D6481 Anemia due to antineoplastic chemotherapy: Secondary | ICD-10-CM

## 2021-05-26 DIAGNOSIS — R970 Elevated carcinoembryonic antigen [CEA]: Secondary | ICD-10-CM | POA: Diagnosis not present

## 2021-05-26 LAB — CBC WITH DIFFERENTIAL/PLATELET
Abs Immature Granulocytes: 0.01 10*3/uL (ref 0.00–0.07)
Basophils Absolute: 0 10*3/uL (ref 0.0–0.1)
Basophils Relative: 1 %
Eosinophils Absolute: 0.1 10*3/uL (ref 0.0–0.5)
Eosinophils Relative: 2 %
HCT: 34.6 % — ABNORMAL LOW (ref 36.0–46.0)
Hemoglobin: 12.1 g/dL (ref 12.0–15.0)
Immature Granulocytes: 0 %
Lymphocytes Relative: 20 %
Lymphs Abs: 1.1 10*3/uL (ref 0.7–4.0)
MCH: 33.5 pg (ref 26.0–34.0)
MCHC: 35 g/dL (ref 30.0–36.0)
MCV: 95.8 fL (ref 80.0–100.0)
Monocytes Absolute: 0.7 10*3/uL (ref 0.1–1.0)
Monocytes Relative: 13 %
Neutro Abs: 3.4 10*3/uL (ref 1.7–7.7)
Neutrophils Relative %: 64 %
Platelets: 155 10*3/uL (ref 150–400)
RBC: 3.61 MIL/uL — ABNORMAL LOW (ref 3.87–5.11)
RDW: 12.8 % (ref 11.5–15.5)
WBC: 5.3 10*3/uL (ref 4.0–10.5)
nRBC: 0 % (ref 0.0–0.2)

## 2021-05-26 LAB — COMPREHENSIVE METABOLIC PANEL
ALT: 691 U/L — ABNORMAL HIGH (ref 0–44)
AST: 763 U/L — ABNORMAL HIGH (ref 15–41)
Albumin: 3.9 g/dL (ref 3.5–5.0)
Alkaline Phosphatase: 364 U/L — ABNORMAL HIGH (ref 38–126)
Anion gap: 10 (ref 5–15)
BUN: 17 mg/dL (ref 8–23)
CO2: 24 mmol/L (ref 22–32)
Calcium: 8.8 mg/dL — ABNORMAL LOW (ref 8.9–10.3)
Chloride: 104 mmol/L (ref 98–111)
Creatinine, Ser: 0.68 mg/dL (ref 0.44–1.00)
GFR, Estimated: >60 mL/min (ref >60)
Glucose, Bld: 160 mg/dL — ABNORMAL HIGH (ref 70–99)
Potassium: 3.9 mmol/L (ref 3.5–5.1)
Sodium: 138 mmol/L (ref 135–145)
Total Bilirubin: 0.7 mg/dL (ref 0.3–1.2)
Total Protein: 6.4 g/dL — ABNORMAL LOW (ref 6.5–8.1)

## 2021-05-26 LAB — HEPATITIS PANEL, ACUTE
HCV Ab: NONREACTIVE
Hep A IgM: NONREACTIVE
Hep B C IgM: NONREACTIVE
Hepatitis B Surface Ag: NONREACTIVE

## 2021-05-26 IMAGING — US US ABDOMEN LIMITED
1 series · 14 of 25 positions shown · non-contrast
Comparison: [DATE] [DATE], [DATE].  [DATE] [DATE], [DATE].

CLINICAL DATA: Abnormal liver enzymes.

EXAM:
ULTRASOUND ABDOMEN LIMITED RIGHT UPPER QUADRANT

[Series 1: us abdomen limited · 0.22mm/px · 14 of 61 slices shown]
[im 1/61]
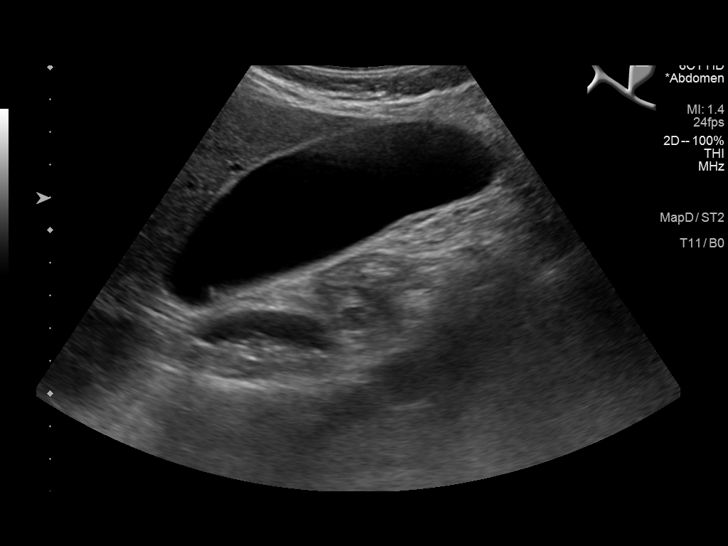
[im 6/61]
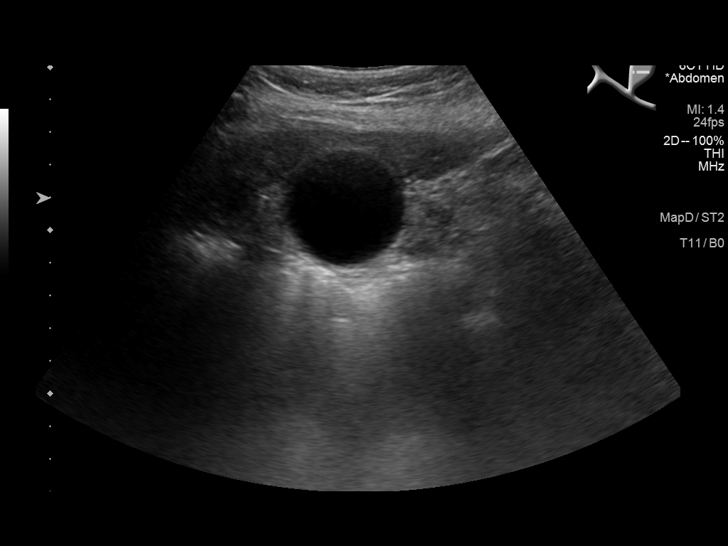
[im 11/61]
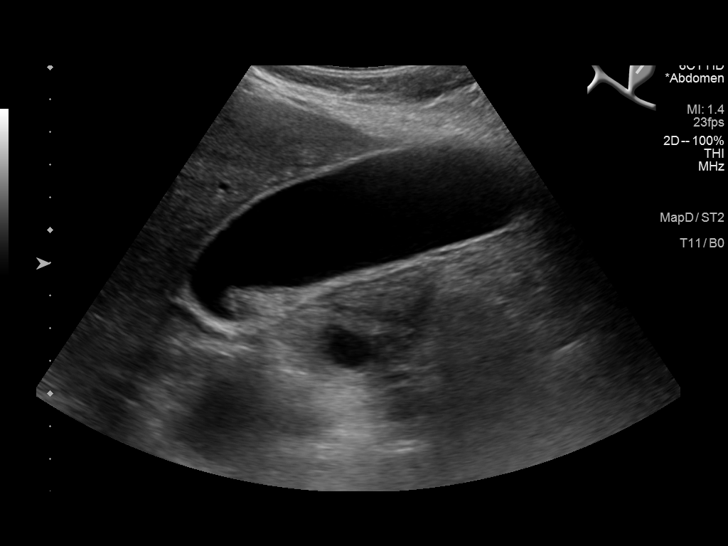
[im 16/61]
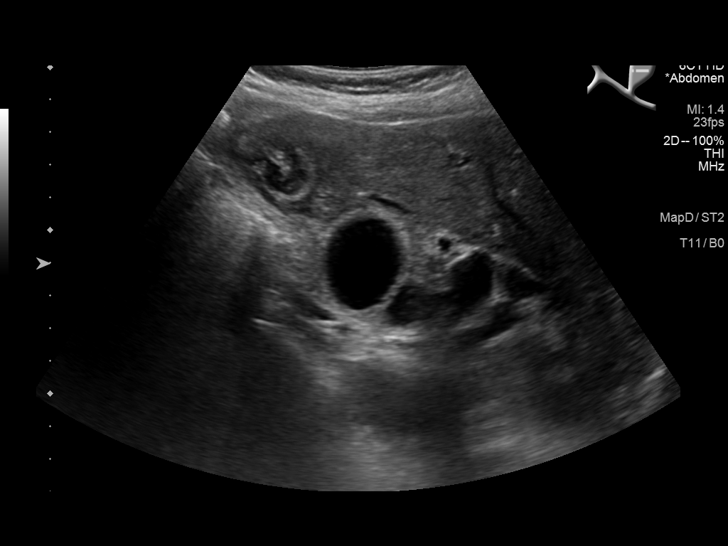
[im 21/61]
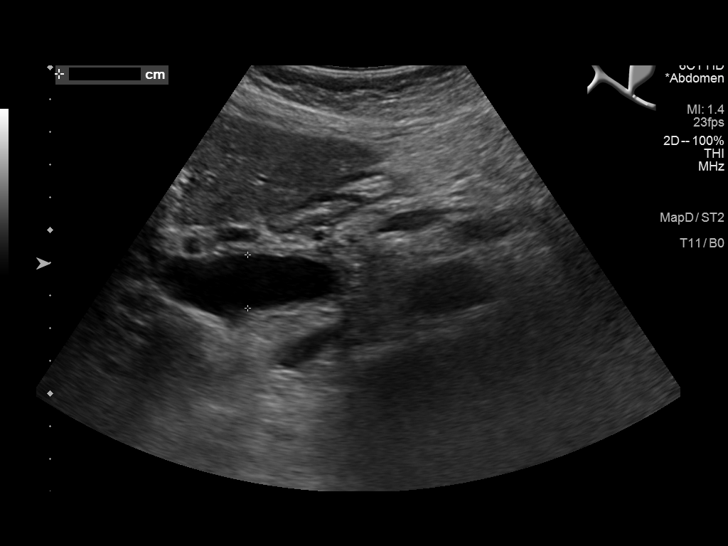
[im 23/61]
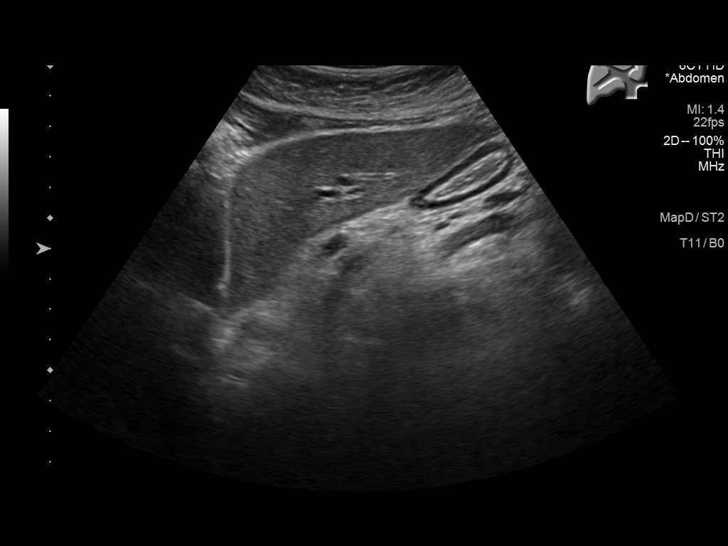
[im 28/61]
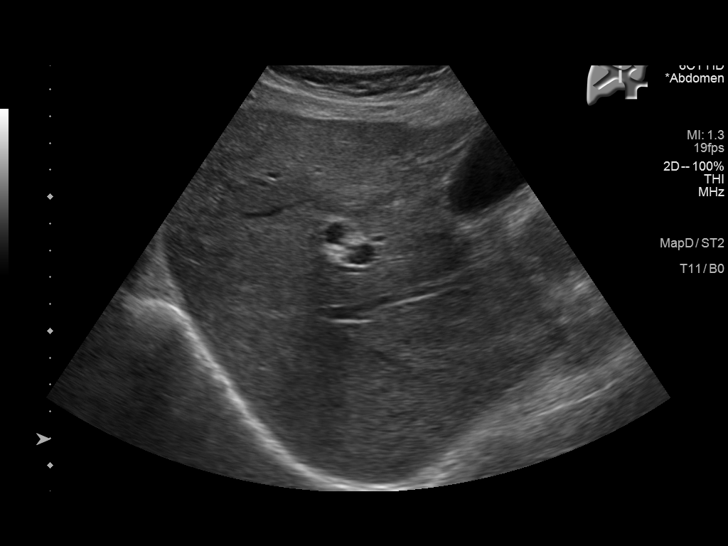
[im 33/61]
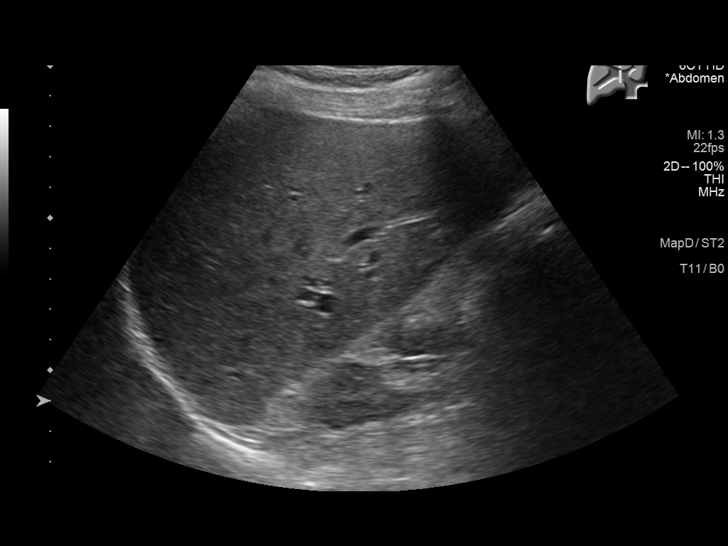
[im 38/61]
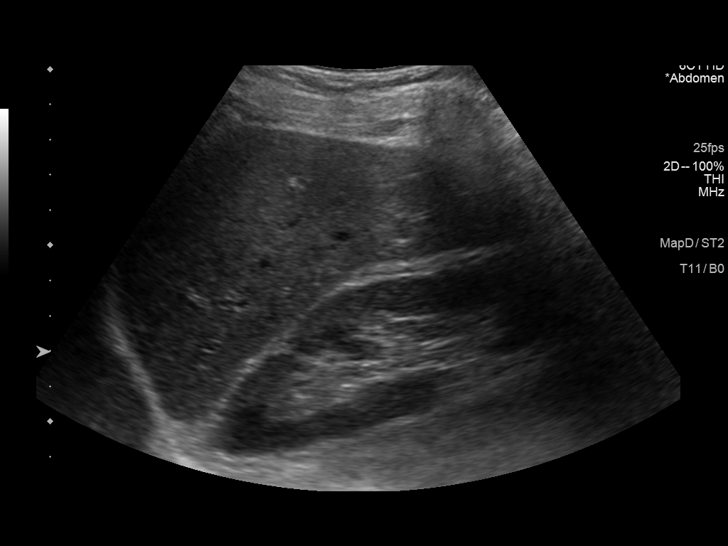
[im 41/61]
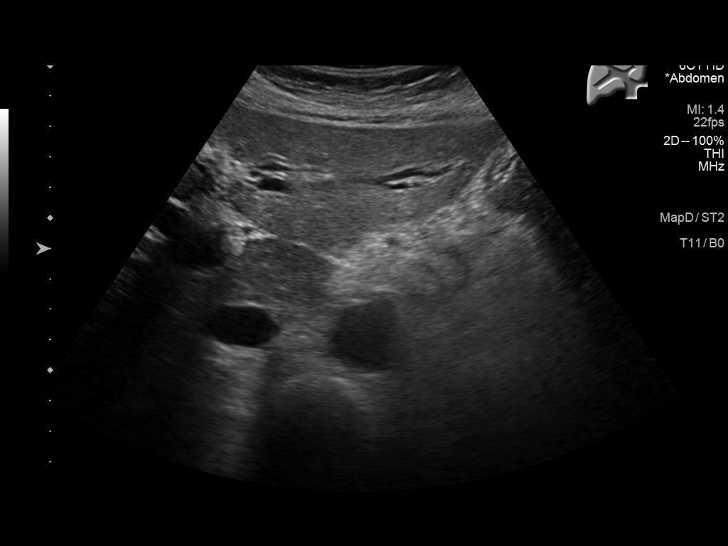
[im 46/61]
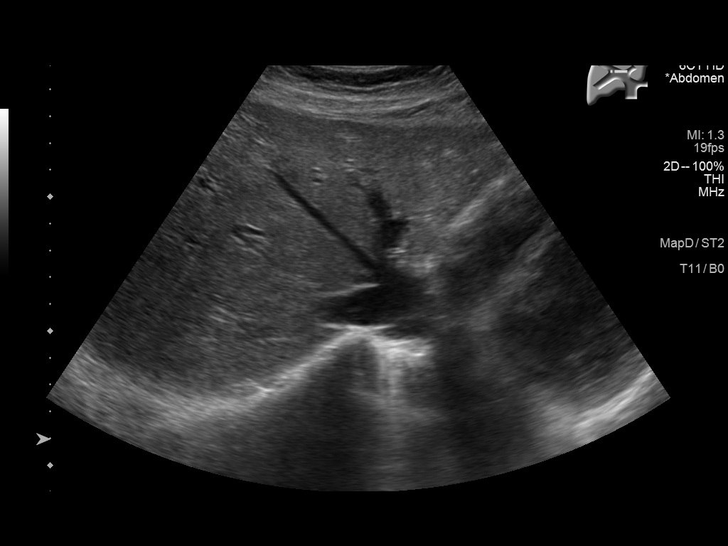
[im 51/61]
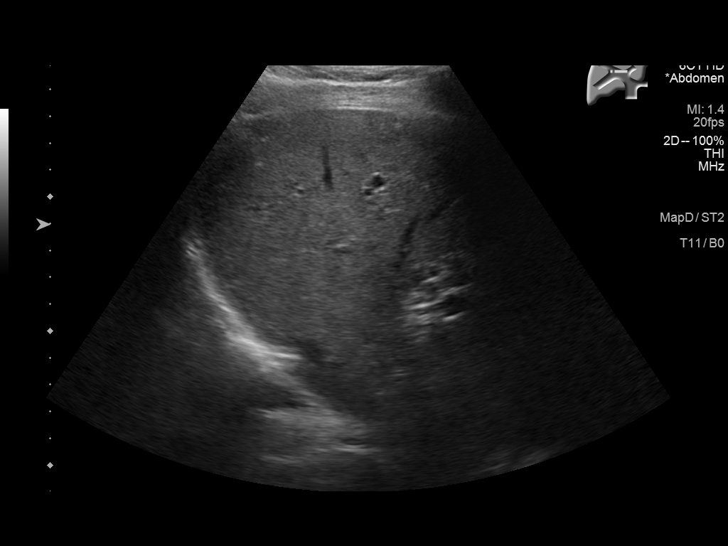
[im 56/61]
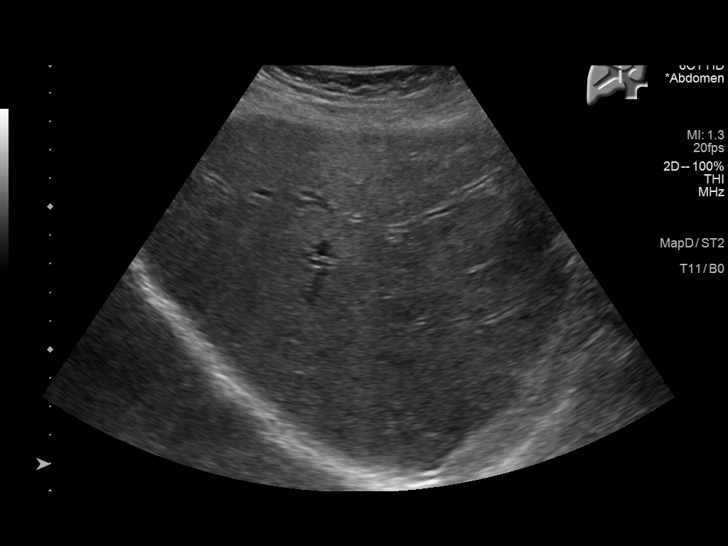
[im 61/61]
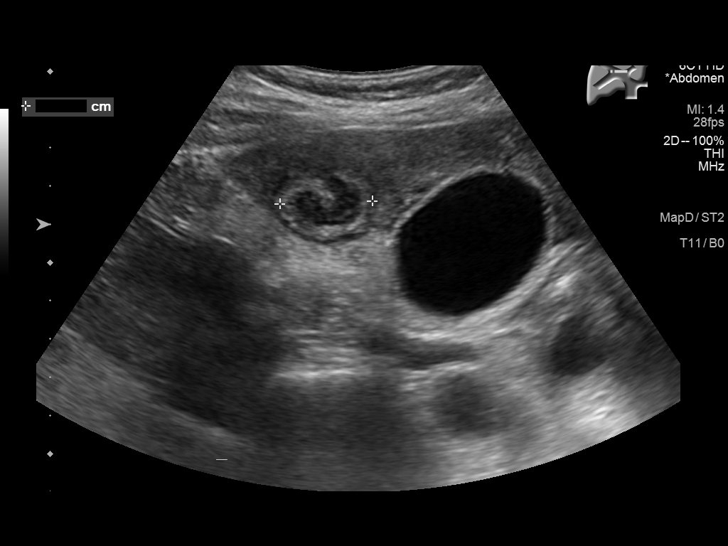

[14 of 25 positions shown; findings below may reference images not displayed]

FINDINGS: Gallbladder:

No gallstones or wall thickening visualized. No sonographic Murphy
sign noted by sonographer.

Common bile duct:

Diameter: 16 mm which is increased compared to prior exam.

Liver:

3.3 x 1.8 cm complex mass is seen in right hepatic lobe
peripherally. Mild intrahepatic biliary dilatation is noted. Within
normal limits in parenchymal echogenicity. Portal vein is patent on
color Doppler imaging with normal direction of blood flow towards
the liver.

Other: None.
IMPRESSION: Interval development of 3.3 x 1.8 cm complex mass in right hepatic
lobe. CT or MRI with intravenous contrast is recommended to evaluate
for metastatic disease or other neoplasm.

Increased extrahepatic ductal dilatation is noted concerning for
distal common bile duct obstruction. Correlation with liver function
tests is recommended.

## 2021-05-26 MED ORDER — HEPARIN SOD (PORK) LOCK FLUSH 100 UNIT/ML IV SOLN
500.0000 [IU] | Freq: Once | INTRAVENOUS | Status: AC
  Administered 2021-05-26: 500 [IU] via INTRAVENOUS
  Filled 2021-05-26: qty 5

## 2021-05-26 NOTE — Progress Notes (Signed)
Nutrition Follow-up:  Patient with metastatic pancreatic cancer.  Patient s/p liver resection at [facility] on 04/10/21.  Patient has resumed chemotherapy but holding today due to elevated liver enzymes.    Met with patient following MD visit.  Patient reports that appetite has been good following surgery.  Continues to take creon and weight has increased.  No nutrition impact symptoms at this time.   Medications: creon  Labs: reviewed  Anthropometrics:   Weight 133 lb today  130 lb 14.4 oz on 9/28 126 lb 6.4 oz on 7/26 123 lb on 6/8 124 lb on 5/25 122 lb on 4/13 130 lb on 12/27  NUTRITION DIAGNOSIS: Inadequate oral intake improved    INTERVENTION:  Continue creon Continue high calorie, high protein foods for weight maintenance    MONITORING, EVALUATION, GOAL: weight trends, intake   NEXT VISIT: as needed with treatment  Aarya Robinson B. Zenia Resides, [place], [place] Registered Dietitian [phone] (mobile)

## 2021-05-26 NOTE — Progress Notes (Signed)
Hematology/Oncology follow up note [facility] Telephone:([phone]) [phone] [phone]   Patient Care Team: [name], MD as PCP - General (Family Medicine) Flinchum, Kelby Aline, FNP (Family Medicine) [name], RN as Oncology Nurse Navigator [name], MD as Consulting Physician (Oncology)  REFERRING PROVIDER: [name], MD  CHIEF COMPLAINTS/REASON FOR VISIT:  Follow up for treatment of pancreatic adenocarcinoma  HISTORY OF PRESENTING ILLNESS:   Cassandra [name] is a  65 y.o.  female with PMH listed below was seen in consultation at the request of  [name], MD  for evaluation of pancreatic adenocarcinoma Patient initially presented with jaundice, transaminitis, bilirubin was 9.9.  CA 19-9 was 1874.  Patient also reports unintentional weight loss. 02/08/2020 MRI abdomen and MRCP with and without contrast was done at [facility] which showed pancreatic head mass measuring up to 3 cm, with marked associated narrowing of the portal vein confluence.  SMA is preserved.  Marked intrahepatic and extrahepatic biliary duct dilatation as well as mild dilatation of the main pancreatic duct. Multiple hepatic masses highly concerning for metastatic disease.  Patient underwent EUS on 02/19/2020, which showed irregular mass identified in the pancreatic head, hypoechoic, measured 47mx33mm, sonographic evidence concerning for invasion into the superior mesenteric artery.  There is no sign of significant abnormality in the main pancreatic duct.  Dilatation of common bile duct which measured up to 16 mm.  Region of celiac artery was visualized and showed no signs of significant abnormality.  No lymphadenopathy.  FNA showed adenocarcinoma.  02/19/2020, ERCP, malignant.  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  The stricture was treated with placement of wall flex metal stent.  [facility] ZPia Mauand was recommended for 3 drug regimen FOLFIRINOX.  [facility]  Patient was referred to establish care today. She denies any pain.  Since stent placement, skin jaundice has improved.  Itchiness has also improved. Patient was accompanied by her husband today.  She has a family history of breast cancer in sister and paternal aunt, colon cancer paternal grandmother.  #no reportable targetable mutation on NGS 9/14/2021cycle 1 FOLFIRINOX.  Patient received oxaliplatin and about 50% of Irinotecan on day 1 and had experienced neurologic symptoms.  She went to ER and working diagnosis is TIA, and eventually I think this is due to irinotecan side effects -irinotecan-associated dysarthria, lip/tongue numbness.  Adjustment was made for Irinotecan to be  infused over 180 minutes.  Atropine 0.5 mg once prior to the irinotecan. No recurrent symptoms.   # 03/19/2020-03/21/2020 patient was admitted due to sepsis with strep pneumonia bacteremia.  Patient was treated with IV Rocephin.  TEE was done which showed no vegetation.  No PFO or ASD.  Patient was discharged home and he finished full course of 14 days of IV Rocephin on 04/02/2020 per ID recommendation.  Repeat blood culture was also negative.  08/25/20 cycle 10 FOLFIRINOX 09/08/20- present  Starting cycle 11, FOLFIRI, Oxaliplatin discontinued due to neuropathy   #NGS showed no reportable targetable mutation #Genetic testing-[employer] diagnostic testing showed no pathological variants identified. MS stable, TMB 070m/mb, KRAS G12D, SF3B1 K700E, TP53 [id number]  #01/14/2021  [facility].  # [facility] [ThDayton Scrapere 4~5 liver lesions on OSH MRI last year at the time of diagnosis highly suspicious  for metastasis. She is not eligible for surgical protocol for metastasis resection given the number of liver metastasis  is >3. However given her excellent and durable response to chemo, surgical resection may be considered pending sustained disease control at 1 year and may require partial hepatectomy first to see if there is viable tumor before proceeding to Whipple resection ]  Patient had a COVID-19 infection in September 2022.  03/23/2021 CT abdomen pelvis No significant change in the ill-defined pancreatic head mass or  associated biliary ductal and pancreatic ductal dilation.  Slight interval enlargement of subcentimeter anterior peripancreatic  lymph node, nonspecific. Attention on follow-up per clinical protocol. Similar enlargement of the ascending thoracic aorta measuring 4.4 cm  03/23/2021 MRI abdomen w and wo  Increasing ill-defined hypoenhancement of the pancreatic head surrounding the common bile duct stent, in keeping with known pancreatic malignancy. Anterior peripancreatic lymph node is better evaluated on CT.  Unchanged position of a common bile duct stent with similar mild diffuse intrahepatic ductal dilation and pneumobilia. No evidence of metastatic disease in the abdomen or pelvis. Partially visualized cystic left adnexal lesion measuring up to 3.9 cm.   04/10/2021, patient underwent liver resection at [facility], by Dr. [name] Ducking.   Pathology report from [facility] was reviewed. Segments 3 partial hepatectomy, negative for viable tumor. Section 5/6 partial hepatectomy part 1 and 2, microscopic foci of residual viable adenocarcinoma, morphologically consistent with history of pancreatic primary.  Parenchymal margin is uninvolved.  #Patient resumed on FOLFIRI on 04/28/2021.  INTERVAL HISTORY Cassandra [name] is a 65 y.o. female who has above history reviewed by me today presents for pancreatic cancer.  Patient tolerated FOLFIRI treatments well.  Intermittent gassy feeling, she takes Creon with symptom relieve.  No recent infections. No fever, chills, nausea vomiting.   Review of Systems  Constitutional:  Negative  for appetite change, chills, fatigue, fever and unexpected weight change.  HENT:   Negative for hearing loss and voice change.   Eyes:  Negative for eye problems.  Respiratory:  Negative for chest tightness and cough.   Cardiovascular:  Negative for chest pain.  Gastrointestinal:  Negative for abdominal distention, abdominal pain and blood in stool.  Endocrine: Negative for hot flashes.  Genitourinary:  Negative for difficulty urinating and frequency.   Musculoskeletal:  Negative for arthralgias.  Skin:  Negative for itching and rash.  Neurological:  Positive for numbness. Negative for extremity weakness.  Hematological:  Negative for adenopathy.  Psychiatric/Behavioral:  Negative for confusion.    MEDICAL HISTORY:  Past Medical History:  Diagnosis Date   Cancer [facility])    pancreatic cancer   Colon polyps    Family history of breast cancer    Neutropenia (HCC) 04/18/2020   Osteopenia after menopause 05/2017   femoral neck T score -2.0   Personal history of chemotherapy    current for pancreatic ca    SURGICAL HISTORY: Past Surgical History:  Procedure Laterality Date   COLONOSCOPY  07/2015   WNL   COLONOSCOPY  2008/2011   IR CV LINE INJECTION  04/28/2021   OVARIAN CYST REMOVAL  1992   dermoid-Dr CAK   PORTA CATH INSERTION N/A 03/07/2020   Procedure: PORTA CATH INSERTION;  Surgeon: Annice Needy, MD;  Location: [facility] INVASIVE CV LAB;  Service: Cardiovascular;  Laterality: N/A;   TEE WITHOUT CARDIOVERSION N/A 03/21/2020   Procedure: TRANSESOPHAGEAL ECHOCARDIOGRAM (TEE);  Surgeon: Laurier Nancy, MD;  Location: [facility] ORS;  Service: Cardiovascular;  Laterality: N/A;   TUBAL LIGATION  1993    SOCIAL HISTORY: Social History   Socioeconomic History   Marital status: Married    Spouse name: Not on file   Number of children: 2   Years of education: Not on file   Highest education level: Not on file  Occupational History   Occupation: Pharmacist, hospital    Comment: retired   Occupation:  Psychologist, sport and exercise  Tobacco Use   Smoking status: Former    Packs/day: 0.50    Years: 10.00    Pack years: 5.00    Types: Cigarettes    Quit date: 1987    Years since quitting: 35.9   Smokeless tobacco: Never   Tobacco comments:    Quit smoking 1987  Vaping Use   Vaping Use: Never used  Substance and Sexual Activity   Alcohol use: Not Currently    Comment: 0-2 mixed drinks a day   Drug use: No   Sexual activity: Not Currently    Birth control/protection: Post-menopausal  Other Topics Concern   Not on file  Social History Narrative   Not on file   Social Determinants of Health   Financial Resource Strain: Not on file  Food Insecurity: Not on file  Transportation Needs: Not on file  Physical Activity: Not on file  Stress: Not on file  Social Connections: Not on file  Intimate Partner Violence: Not on file    FAMILY HISTORY: Family History  Problem Relation Age of Onset   Breast cancer Paternal Aunt 18   Diabetes Mother    Osteoporosis Mother    Hyperlipidemia Father    Rheumatic fever Father    Valvular heart disease Father    Cancer Paternal Grandmother        possible colon   Breast cancer Sister 96    ALLERGIES:  is allergic to penicillin g.  MEDICATIONS:  Current Outpatient Medications  Medication Sig Dispense Refill   Calcium Carbonate-Vit D-Min (CALCIUM 1200 PO) Take by mouth.     Cholecalciferol 25 MCG (1000 UT) tablet Take 2,000 Units by mouth daily.     Cyanocobalamin (VITAMIN B 12 PO) Take by mouth.     lidocaine-prilocaine (EMLA) cream Apply to affected area once 30 g 3   loperamide (IMODIUM A-D) 2 MG tablet Take 2 at onset of diarrhea, then 1 every 2hrs until 12hr without a BM. May take 2 tab every 4hrs at bedtime. If diarrhea recurs repeat. 100 tablet 1   prochlorperazine (COMPAZINE) 10 MG tablet Take 1 tablet (10 mg total) by mouth every 6 (six) hours as needed (Nausea or vomiting). 60 tablet 1   simethicone (GAS-X) 80 MG chewable tablet Chew 1 tablet (80  mg total) by mouth every 8 (eight) hours as needed for flatulence. 60 tablet 0   magic mouthwash w/lidocaine SOLN Take 5 mLs by mouth 4 (four) times daily as needed for mouth pain. Sig: Swish & Spit 5-10 ml four times a day as needed. Dispense 480 ml. 1RF (Patient not taking: Reported on 12/17/2020) 480 mL 1   Multiple Vitamins-Minerals (MULTIVITAMIN WITH MINERALS) tablet Take 1 tablet by mouth daily. (Patient not taking: Reported on 05/26/2021)     ondansetron (ZOFRAN) 8 MG tablet Take 1 tablet (8 mg total) by mouth 2 (two) times daily as needed. Start on day 3 after chemotherapy. (Patient not taking: Reported on 03/25/2021) 30 tablet 1   Pancrelipase, Lip-Prot-Amyl, (CREON) 24000-76000 units CPEP Take 1 capsule (24,000 Units total) by mouth 3 (three) times daily before meals. 90 capsule 3  potassium chloride SA (KLOR-CON M20) 20 MEQ tablet Take 1 tablet (20 mEq total) by mouth daily. (Patient not taking: No sig reported) 30 tablet 0   No current facility-administered medications for this visit.   Facility-Administered Medications Ordered in Other Visits  Medication Dose Route Frequency Provider Last Rate Last Admin   sodium chloride flush (NS) 0.9 % injection 10 mL  10 mL Intravenous PRN [name], MD   10 mL at 02/18/21 0858   sodium chloride flush (NS) 0.9 % injection 10 mL  10 mL Intravenous Once Borders, Vonna Kotyk R, NP       sodium chloride flush (NS) 0.9 % injection 10 mL  10 mL Intravenous Once [name], MD         PHYSICAL EXAMINATION: ECOG PERFORMANCE STATUS: 0 - Asymptomatic Vitals:   05/26/21 0833  BP: 121/74  Pulse: 90  Temp: 98.4 F (36.9 C)   Filed Weights   05/26/21 0833  Weight: 133 lb (60.3 kg)    Physical Exam Constitutional:      General: She is not in acute distress. HENT:     Head: Normocephalic and atraumatic.  Eyes:     General: No scleral icterus. Cardiovascular:     Rate and Rhythm: Normal rate and regular rhythm.     Heart sounds: Normal heart sounds.   Pulmonary:     Effort: Pulmonary effort is normal. No respiratory distress.     Breath sounds: No wheezing.  Abdominal:     General: Bowel sounds are normal. There is no distension.     Palpations: Abdomen is soft.  Musculoskeletal:        General: No deformity. Normal range of motion.     Cervical back: Normal range of motion and neck supple.  Skin:    General: Skin is warm and dry.     Coloration: Skin is not jaundiced.     Findings: No erythema or rash.  Neurological:     Mental Status: She is alert and oriented to person, place, and time. Mental status is at baseline.     Cranial Nerves: No cranial nerve deficit.     Coordination: Coordination normal.  Psychiatric:        Mood and Affect: Mood normal.    LABORATORY DATA:  I have reviewed the data as listed Lab Results  Component Value Date   WBC 5.3 05/26/2021   HGB 12.1 05/26/2021   HCT 34.6 (L) 05/26/2021   MCV 95.8 05/26/2021   PLT 155 05/26/2021   Recent Labs    04/28/21 0807 05/12/21 0809 05/26/21 0803  NA 141 140 138  K 4.2 4.0 3.9  CL 106 104 104  CO2 $Re'28 25 24  'nGY$ GLUCOSE 75 81 160*  BUN $Re'20 19 17  'zap$ CREATININE 0.67 0.68 0.68  CALCIUM 9.0 8.8* 8.8*  GFRNONAA >60 >60 >60  PROT 6.9 6.9 6.4*  ALBUMIN 4.0 4.1 3.9  AST 22 28 763*  ALT 25 29 691*  ALKPHOS 122 112 364*  BILITOT 0.2* 0.4 0.7    Iron/TIBC/Ferritin/ %Sat No results found for: IRON, TIBC, FERRITIN, IRONPCTSAT    RADIOGRAPHIC STUDIES: I have personally reviewed the radiological images as listed and agreed with the findings in the report. Reviewed findings of MRI abdomen MRCP done at [facility] US Abdomen Complete  Result Date: 03/19/2021 CLINICAL DATA:  Abdominal pain and transaminitis. History of pancreas cancer. EXAM: ABDOMEN ULTRASOUND COMPLETE COMPARISON:  MRI 06/11/2020 FINDINGS: Gallbladder: Gallbladder sludge noted. No gallstones or pericholecystic fluid. Mild  diffuse gallbladder wall thickening measuring between 3 and 5 mm. Negative  sonographic Murphy's sign. Common bile duct: Diameter: Measures 8.5 mm proximally and up to 17 mm near the head of pancreas. Known common bile duct stent is difficult to visualize. Liver: Intrahepatic bile duct dilatation. Normal parenchymal echogenicity. No mass. Portal vein is patent on color Doppler imaging with normal direction of blood flow towards the liver. IVC: No abnormality visualized. Pancreas: The head of the pancreas is prominent compatible with the history of known pancreatic adenocarcinoma. Spleen: Size and appearance within normal limits. Right Kidney: Length: 10.3 cm. Echogenicity within normal limits. No mass or hydronephrosis visualized. Left Kidney: Length: 10.0 cm. Echogenicity within normal limits. No mass or hydronephrosis visualized. Abdominal aorta: No aneurysm visualized. Other findings: None. IMPRESSION: 1. Intrahepatic and extrahepatic bile duct dilatation. This is similar to the CT from 10/30/2020. Known common bile duct stent is difficult to visualize. 2. Gallbladder sludge with mild diffuse gallbladder wall thickening. No gallstones, pericholecystic fluid, or sonographic Murphy's sign. Electronically Signed   By: Kerby Moors M.D.   On: 03/19/2021 10:22   IR CV Line Injection  Result Date: 04/28/2021 CLINICAL DATA:  no blood return EXAM: PORT  CATHETER INJECTION UNDER FLUOROSCOPY TECHNIQUE: The procedure, risks (including but not limited to bleeding, infection, organ damage ), benefits, and alternatives were explained to the patient. Questions regarding the procedure were encouraged and answered. The patient understands and consents to the procedure. Survey fluoroscopic inspection reveals good position of right IJ power injectable port catheter, tip in the proximal right atrium. No catheter discontinuity or fracture. Injection demonstrates patency of the system. No extravasation. No evidence of fibrin sheath. Contrast flows freely into the right atrium. IMPRESSION: 1. Normal  appearance and function of right IJ power injectable port catheter. Okay for routine use. Consider Cathflo dwell per protocol if aspiration issues persist. Electronically Signed   By: Lucrezia Europe M.D.   On: 04/28/2021 11:20   US Abdomen Limited RUQ (LIVER/GB)  Result Date: 05/26/2021 CLINICAL DATA:  Abnormal liver enzymes. EXAM: ULTRASOUND ABDOMEN LIMITED RIGHT UPPER QUADRANT COMPARISON:  March 19, 2021.  Oct 30, 2020. FINDINGS: Gallbladder: No gallstones or wall thickening visualized. No sonographic Murphy sign noted by sonographer. Common bile duct: Diameter: 16 mm which is increased compared to prior exam. Liver: 3.3 x 1.8 cm complex mass is seen in right hepatic lobe peripherally. Mild intrahepatic biliary dilatation is noted. Within normal limits in parenchymal echogenicity. Portal vein is patent on color Doppler imaging with normal direction of blood flow towards the liver. Other: None. IMPRESSION: Interval development of 3.3 x 1.8 cm complex mass in right hepatic lobe. CT or MRI with intravenous contrast is recommended to evaluate for metastatic disease or other neoplasm. Increased extrahepatic ductal dilatation is noted concerning for distal common bile duct obstruction. Correlation with liver function tests is recommended. Electronically Signed   By: Marijo Conception M.D.   On: 05/26/2021 14:18      ASSESSMENT & PLAN:  1. Primary pancreatic cancer ([place])   2. 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 Pancreatic insufficiency   5. Elevated CEA   6. Anemia due to antineoplastic chemotherapy   7. 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  Pathology proved residual  disease  Labs reviewed and discussed with patient. Hold treatment  # Elevated LFT, bilirubin is normal  Hold treatment.  STAT US liver showed 3.3 x 1.8 cm complex mass in right hepatic lobe. Increased extrahepatic ductal dilation, concerning for distal common bile duct obstruction.  Will obtain STAT MRI abdomen MRCP.  I communicated with her [facility] Surgeon Dr.Zani,if she remains asymptomatic, plan is for her to see him next week.  If symptomatic, then admission.  Check acute hepatitis panel  #pancreatic insufficiency.  Continue Creon,continue  24000 units TID with meals.   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   Patient follows up with neurology for management.Marland Kitchen   #Elevated CEA,-  Patient is due for colonoscopy and has appt with gastroenterology for colonoscopy.    All questions were answered. The patient knows to call the clinic with any problems questions or concerns.  Return of visit: TBD  [name], MD, PhD  05/26/2021

## 2021-05-27 ENCOUNTER — Ambulatory Visit
Admission: RE | Admit: 2021-05-27 | Discharge: 2021-05-27 | Disposition: A | Discharge status: Home / Self Care | Payer: Self-pay | Source: Ambulatory Visit | Attending: Oncology | Admitting: Oncology

## 2021-05-27 ENCOUNTER — Other Ambulatory Visit: Payer: Self-pay

## 2021-05-27 ENCOUNTER — Telehealth: Payer: Self-pay

## 2021-05-27 ENCOUNTER — Other Ambulatory Visit: Payer: Self-pay | Admitting: Oncology

## 2021-05-27 ENCOUNTER — Ambulatory Visit
Admission: RE | Admit: 2021-05-27 | Discharge: 2021-05-27 | Disposition: A | Discharge status: Home / Self Care | Payer: Medicare Other | Source: Ambulatory Visit | Attending: Oncology | Admitting: Oncology

## 2021-05-27 DIAGNOSIS — C259 Malignant neoplasm of pancreas, unspecified: Secondary | ICD-10-CM

## 2021-05-27 LAB — CANCER ANTIGEN 19-9: CA 19-9: 11 U/mL (ref 0–35)

## 2021-05-27 IMAGING — MR MR ABDOMEN WO/W CM MRCP
14 of 15 series · 39 of 48 positions shown · IV contrast (GADAVIST)
Comparison: CT chest abdomen pelvis, [DATE]
COMPARISON: CT chest abdomen pelvis, [DATE]

Addendum:
CLINICAL DATA: Pancreatic cancer, elevated LFTs

EXAM:
MRI ABDOMEN WITHOUT AND WITH CONTRAST (INCLUDING MRCP)
TECHNIQUE: Multiplanar multisequence MR imaging of the abdomen was performed
both before and after the administration of intravenous contrast.
Heavily T2-weighted images of the biliary and pancreatic ducts were
obtained, and three-dimensional MRCP images were rendered by post
processing.
CONTRAST:  6mL GADAVIST GADOBUTROL 1 MMOL/ML IV SOLN

[Series 3: bSSFP · coronal · 6.0mm · 0.74mm/px · 1 of 27 slices shown]
[im 1/27]
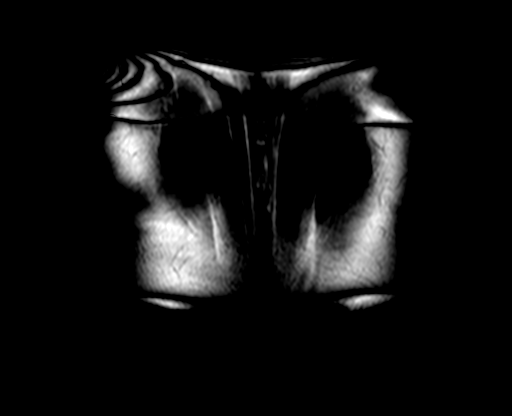

[Series 4: T2 fat-sat · axial · 6.0mm · 1.37mm/px · 1 of 30 slices shown]
[im 1/30]
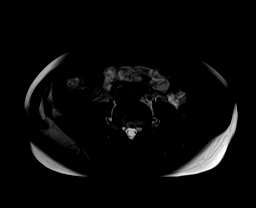

[Series 6: T2 · coronal · 1.5mm · 0.99mm/px · 4 of 72 slices shown]
[im 1/72]
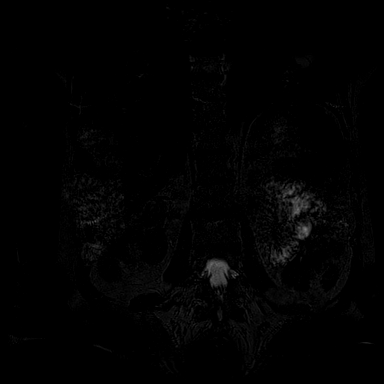
[im 24/72]
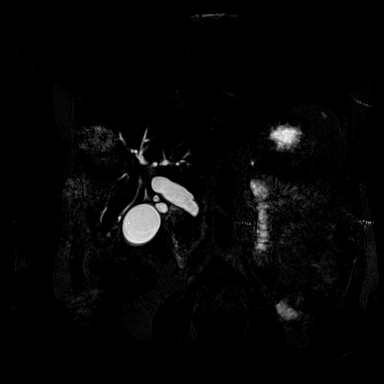
[im 48/72]
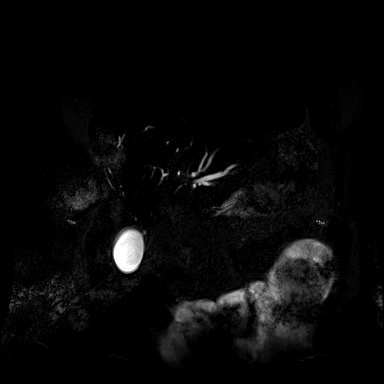
[im 72/72]
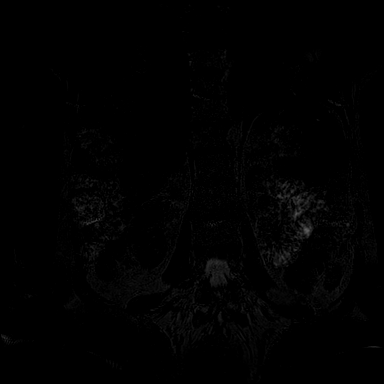

[Series 8: cor thins · coronal · 3.0mm · 0.94mm/px · 1 of 11 slices shown]
[im 1/11]
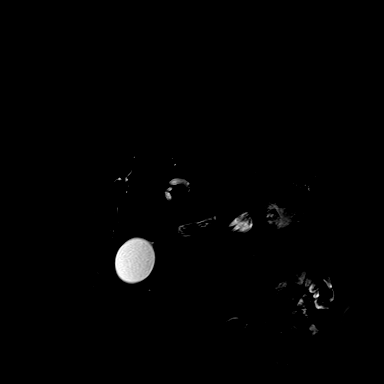

[Series 10: T1 · axial · 6.0mm · 0.68mm/px · z∈[-157,+88]mm · 4 of 70 slices shown]
[im 1/70]
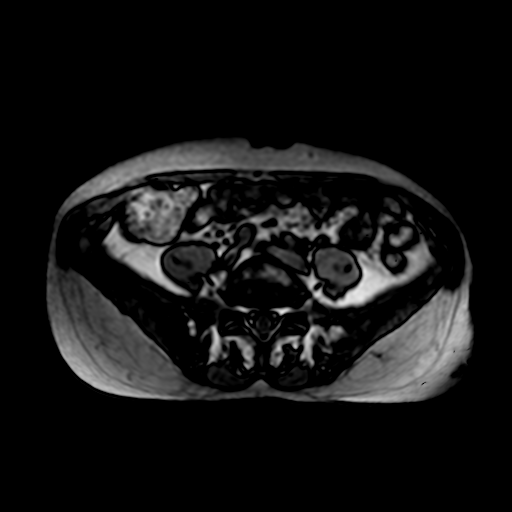
[im 24/70]
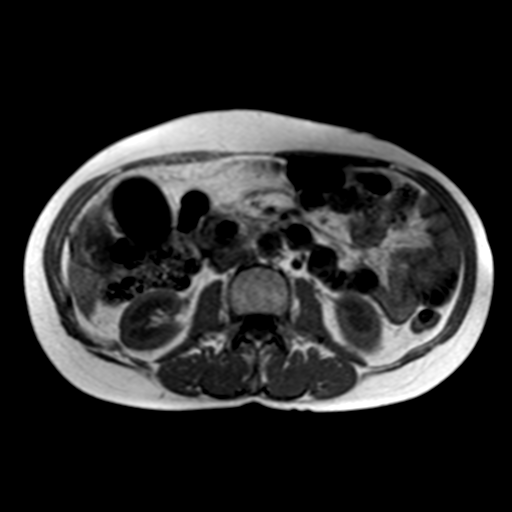
[im 47/70]
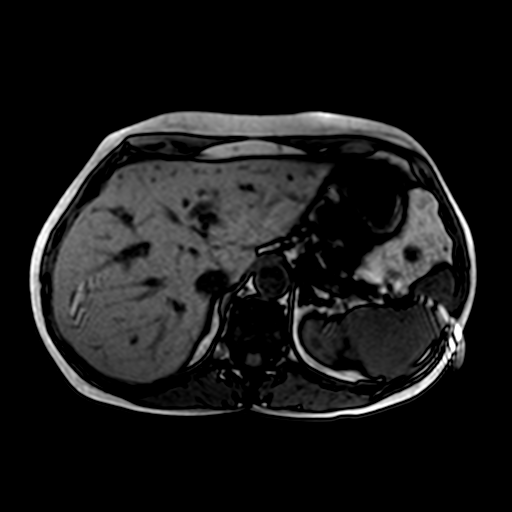
[im 70/70]
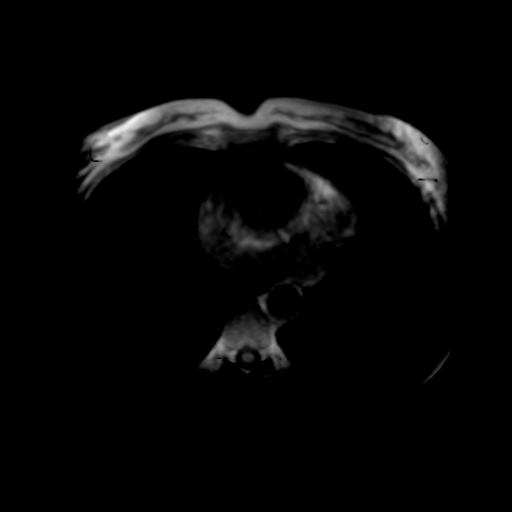

[Series 11: MRCP · coronal · 50.0mm · 0.78mm/px · 1 of 4 slices shown]
[im 1/4]
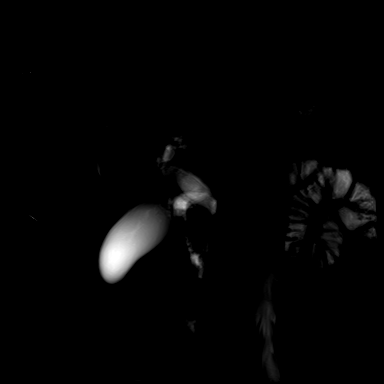

[Series 12: axial ssfse / · axial · 6.0mm · 1.09mm/px · z∈[-157,+88]mm · 2 of 35 slices shown]
[im 1/35]
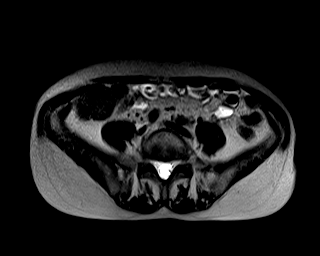
[im 35/35]
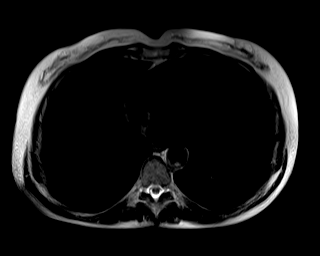

[Series 13: axial dynamic pre · axial · non-contrast · 4.0mm · 1.19mm/px · z∈[-161,+91]mm · 4 of 64 slices shown]
[im 1/64]
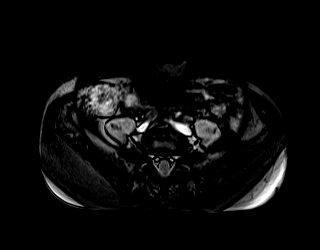
[im 22/64]
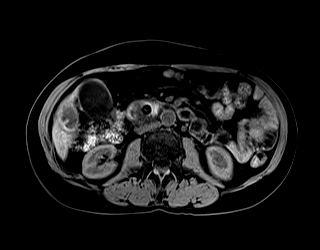
[im 43/64]
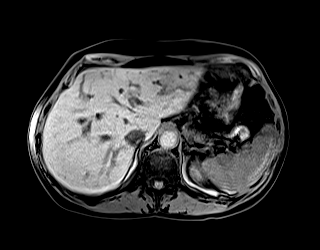
[im 64/64]
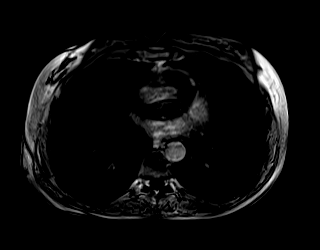

[Series 14: axial dynamic post · axial · 4.0mm · 1.19mm/px · z∈[-161,+91]mm · 4 of 64 slices shown (1 of 6)]
[im 1/64]
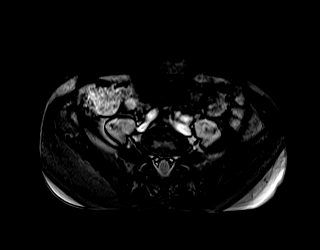
[im 22/64]
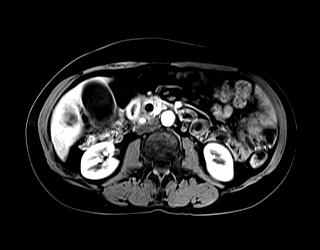
[im 43/64]
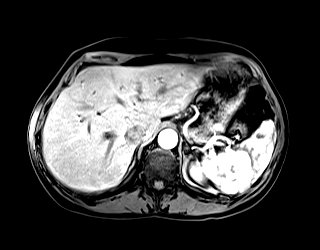
[im 64/64]
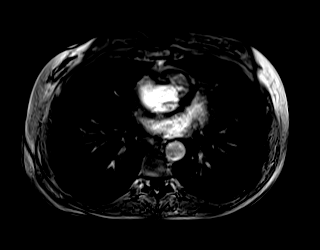

[Series 15: axial dynamic post · axial · 4.0mm · 1.19mm/px · z∈[-161,+91]mm · 4 of 64 slices shown (2 of 6)]
[im 1/64]
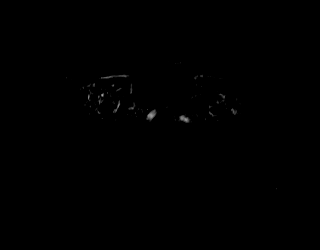
[im 22/64]
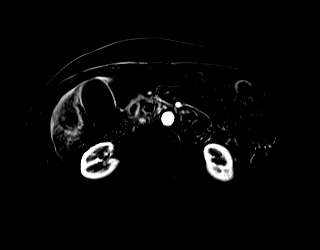
[im 43/64]
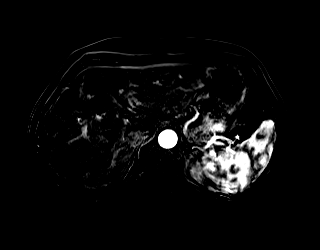
[im 64/64]
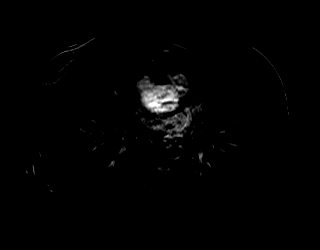

[Series 16: axial dynamic post · axial · 4.0mm · 1.19mm/px · z∈[-161,+91]mm · 4 of 64 slices shown (3 of 6)]
[im 1/64]
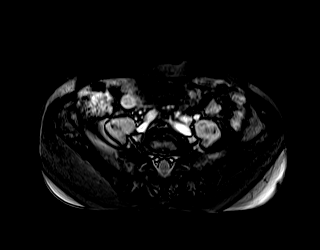
[im 22/64]
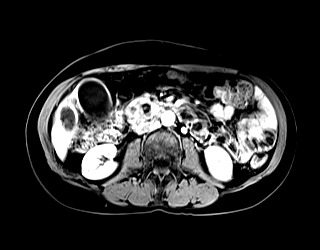
[im 43/64]
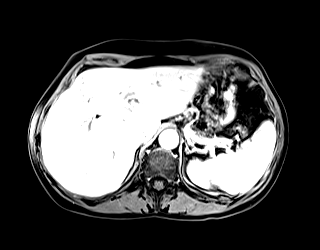
[im 64/64]
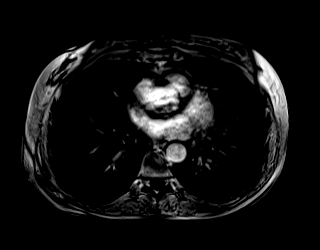

[Series 17: axial dynamic post · axial · 4.0mm · 1.19mm/px · z∈[-161,+91]mm · 4 of 64 slices shown (4 of 6)]
[im 1/64]
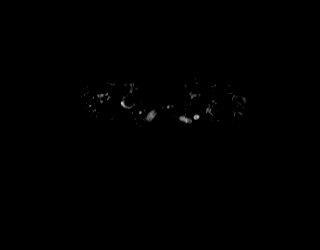
[im 22/64]
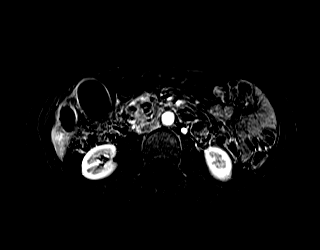
[im 43/64]
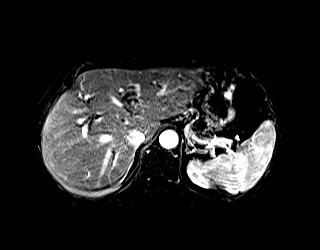
[im 64/64]
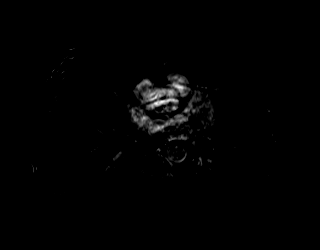

[Series 18: axial dynamic post · axial · 4.0mm · 1.19mm/px · z∈[-161,+91]mm · 4 of 64 slices shown (5 of 6)]
[im 1/64]
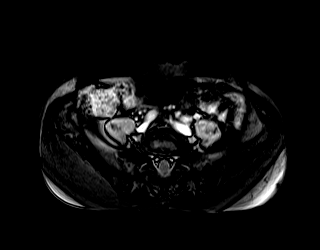
[im 22/64]
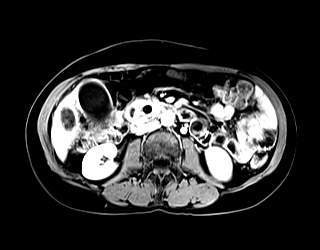
[im 43/64]
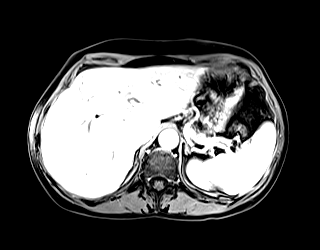
[im 64/64]
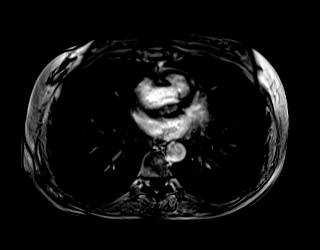

[Series 19: axial dynamic post · axial · 4.0mm · 1.19mm/px · 1 of 64 slices shown (6 of 6)]
[im 1/64]
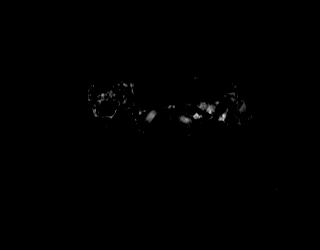

[39 of 48 positions shown; findings below may reference images not displayed]

FINDINGS: Lower chest: No acute findings.

Hepatobiliary: Severe intra and extrahepatic biliary ductal
dilatation, increased compared to prior CT. The common bile duct
measures 1.8 cm, previously 1.1 cm. Common bile duct stent remains
in position. Portal veins are patent.

Pancreas: Unchanged post treatment appearance of pancreatic head
soft tissue, although ill-defined measuring approximately 2.9 x
cm (series 14, image 40). Severe atrophy of the pancreatic
parenchyma distally with prominence of the pancreatic duct.

Spleen:  Within normal limits in size and appearance.

Adrenals/Urinary Tract: No masses identified. No evidence of
hydronephrosis.

Stomach/Bowel: Visualized portions within the abdomen are
unremarkable.

Vascular/Lymphatic: No pathologically enlarged lymph nodes
identified. No abdominal aortic aneurysm demonstrated.

Other:  None.

Musculoskeletal: No suspicious bone lesions identified.
IMPRESSION: 1. Unchanged post treatment appearance of pancreatic head soft
tissue, in keeping with primary pancreatic malignancy.
2. Severe intra and extrahepatic biliary ductal dilatation,
increased compared to prior CT. Common bile duct stent remains in
position, however patency is not established on this MR examination.
3. No evidence of lymphadenopathy or metastatic disease in the
abdomen.

ADDENDUM:
There is a subcapsular, nonenhancing lesion of the inferior tip of
the right lobe of the liver, hepatic segment VII, adjacent to the
gallbladder fossa, with internal fluid signal and no appreciable
associated contrast enhancement measuring 3.4 x 2.3 cm. This
appearance is generally not consistent with metastasis and is of
uncertain significance, possibly reflecting a hepatic abscess in the
setting of biliary obstruction or residua of a subcapsular hematoma.

Findings discussed with Dr. WHEELIADOR on [DATE] and [DATE].

*** End of Addendum ***
FINDINGS: Lower chest: No acute findings.

Hepatobiliary: Severe intra and extrahepatic biliary ductal
dilatation, increased compared to prior CT. The common bile duct
measures 1.8 cm, previously 1.1 cm. Common bile duct stent remains
in position. Portal veins are patent.

Pancreas: Unchanged post treatment appearance of pancreatic head
soft tissue, although ill-defined measuring approximately 2.9 x
cm (series 14, image 40). Severe atrophy of the pancreatic
parenchyma distally with prominence of the pancreatic duct.

Spleen:  Within normal limits in size and appearance.

Adrenals/Urinary Tract: No masses identified. No evidence of
hydronephrosis.

Stomach/Bowel: Visualized portions within the abdomen are
unremarkable.

Vascular/Lymphatic: No pathologically enlarged lymph nodes
identified. No abdominal aortic aneurysm demonstrated.

Other:  None.

Musculoskeletal: No suspicious bone lesions identified.
IMPRESSION: 1. Unchanged post treatment appearance of pancreatic head soft
tissue, in keeping with primary pancreatic malignancy.
2. Severe intra and extrahepatic biliary ductal dilatation,
increased compared to prior CT. Common bile duct stent remains in
position, however patency is not established on this MR examination.
3. No evidence of lymphadenopathy or metastatic disease in the
abdomen.

## 2021-05-27 MED ORDER — GADOBUTROL 1 MMOL/ML IV SOLN
6.0000 mL | Freq: Once | INTRAVENOUS | Status: AC | PRN
  Administered 2021-05-27: 6 mL via INTRAVENOUS

## 2021-05-27 NOTE — Telephone Encounter (Signed)
Request faxed for [facility] to be powershared to [place] [facility].

## 2021-05-27 NOTE — Telephone Encounter (Signed)
-----   Message from [name], MD sent at 05/26/2021  6:46 PM EST ----- Please arrange her to get STAT MRI abdomen/MRCP- pancreatic cancer, elevated LFT.  Please get her recent MRI abdomen done on 03/23/2021 at [facility] for comparison.

## 2021-05-27 NOTE — Telephone Encounter (Signed)
Done pt aware °

## 2021-05-27 NOTE — Telephone Encounter (Signed)
Please schedule patient for STAT MRI abdomen/MRCP. Please notify patient of appointment. Thanks   We will obtain recent MRI done at [facility] on 03/23/2021.

## 2021-05-28 ENCOUNTER — Inpatient Hospital Stay: Payer: Medicare Other

## 2021-05-30 ENCOUNTER — Telehealth: Payer: Self-pay | Admitting: Oncology

## 2021-05-30 NOTE — Telephone Encounter (Signed)
I have called patient and communicated with her about the results. I have also discussed with radiology and also her [facility] Dr.Zani.  Patient will need to see [facility] next week for further evaluation of patency of her stent. I advise patient to call Dr.Zani's office and get appt.  She is asymptomatic currently, I advise her to go to [facility] ER if she feels sick. She voices understanding

## 2021-06-09 ENCOUNTER — Telehealth: Payer: Self-pay | Admitting: *Deleted

## 2021-06-09 NOTE — Telephone Encounter (Signed)
Patient called wanting Dr Tasia Catchings to know that she had an ERCP Friday at [facility] and that she is ready to restart chemotherapy or have labs drawn or whatever Dr Tasia Catchings thinks is appropriate.

## 2021-06-10 ENCOUNTER — Encounter: Payer: Self-pay | Admitting: Oncology

## 2021-06-10 ENCOUNTER — Other Ambulatory Visit: Payer: Self-pay | Admitting: Oncology

## 2021-06-10 NOTE — Progress Notes (Signed)
DISCONTINUE ON PATHWAY REGIMEN - Pancreatic Adenocarcinoma     A cycle is every 14 days:     Oxaliplatin      Leucovorin      Irinotecan      Fluorouracil   **Always confirm dose/schedule in your pharmacy ordering system**  REASON: Toxicities / Adverse Event PRIOR TREATMENT: PANOS98: mFOLFIRINOX q14 Days Until Progression or Toxicity TREATMENT RESPONSE: Complete Response (CR)  START ON PATHWAY REGIMEN - Pancreatic Adenocarcinoma     A cycle is every 28 days:     Nab-paclitaxel (protein bound)      Gemcitabine   **Always confirm dose/schedule in your pharmacy ordering system**  Patient Characteristics: Metastatic Disease, Second Line, MSS/pMMR or MSI Unknown, Fluoropyrimidine-Based Therapy First Line Therapeutic Status: Metastatic Disease Line of Therapy: Second Line Microsatellite/Mismatch Repair Status: MSS/pMMR Intent of Therapy: Non-Curative / Palliative Intent, Discussed with Patient

## 2021-06-10 NOTE — Telephone Encounter (Signed)
Pt schedule for lab/MD/ Gemcitabine/ abraxane *new* on Friday. We have verified with infusion, pharmacy and auth department that this is ok. Cassandra [name] has informed pt of appts.

## 2021-06-10 NOTE — Progress Notes (Signed)
ON PATHWAY REGIMEN - Pancreatic Adenocarcinoma ° °No Change  Continue With Treatment as Ordered. 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

## 2021-06-11 ENCOUNTER — Ambulatory Visit: Payer: Medicare Other

## 2021-06-11 ENCOUNTER — Other Ambulatory Visit: Payer: Medicare Other

## 2021-06-11 ENCOUNTER — Ambulatory Visit: Payer: Medicare Other | Admitting: Oncology

## 2021-06-12 ENCOUNTER — Encounter: Payer: Self-pay | Admitting: Oncology

## 2021-06-12 ENCOUNTER — Inpatient Hospital Stay: Payer: Medicare Other

## 2021-06-12 ENCOUNTER — Inpatient Hospital Stay (HOSPITAL_BASED_OUTPATIENT_CLINIC_OR_DEPARTMENT_OTHER): Payer: Medicare Other | Admitting: Oncology

## 2021-06-12 ENCOUNTER — Other Ambulatory Visit: Payer: Self-pay

## 2021-06-12 ENCOUNTER — Inpatient Hospital Stay: Payer: Medicare Other | Attending: Oncology

## 2021-06-12 VITALS — BP 117/77 | HR 66 | Temp 97.4°F | Resp 18 | Wt 136.1 lb

## 2021-06-12 DIAGNOSIS — C259 Malignant neoplasm of pancreas, unspecified: Secondary | ICD-10-CM

## 2021-06-12 DIAGNOSIS — Z5111 Encounter for antineoplastic chemotherapy: Secondary | ICD-10-CM | POA: Insufficient documentation

## 2021-06-12 DIAGNOSIS — D6481 Anemia due to antineoplastic chemotherapy: Secondary | ICD-10-CM | POA: Diagnosis not present

## 2021-06-12 DIAGNOSIS — G620 Drug-induced polyneuropathy: Secondary | ICD-10-CM

## 2021-06-12 DIAGNOSIS — C787 Secondary malignant neoplasm of liver and intrahepatic bile duct: Secondary | ICD-10-CM | POA: Diagnosis present

## 2021-06-12 DIAGNOSIS — K8689 Other specified diseases of pancreas: Secondary | ICD-10-CM | POA: Diagnosis not present

## 2021-06-12 DIAGNOSIS — T451X5A Adverse effect of antineoplastic and immunosuppressive drugs, initial encounter: Secondary | ICD-10-CM

## 2021-06-12 LAB — CBC WITH DIFFERENTIAL/PLATELET
Abs Immature Granulocytes: 0.02 10*3/uL (ref 0.00–0.07)
Basophils Absolute: 0 10*3/uL (ref 0.0–0.1)
Basophils Relative: 1 %
Eosinophils Absolute: 0.2 10*3/uL (ref 0.0–0.5)
Eosinophils Relative: 3 %
HCT: 37.3 % (ref 36.0–46.0)
Hemoglobin: 12.7 g/dL (ref 12.0–15.0)
Immature Granulocytes: 0 %
Lymphocytes Relative: 30 %
Lymphs Abs: 1.6 10*3/uL (ref 0.7–4.0)
MCH: 32.6 pg (ref 26.0–34.0)
MCHC: 34 g/dL (ref 30.0–36.0)
MCV: 95.9 fL (ref 80.0–100.0)
Monocytes Absolute: 0.6 10*3/uL (ref 0.1–1.0)
Monocytes Relative: 11 %
Neutro Abs: 3 10*3/uL (ref 1.7–7.7)
Neutrophils Relative %: 55 %
Platelets: 182 10*3/uL (ref 150–400)
RBC: 3.89 MIL/uL (ref 3.87–5.11)
RDW: 12.5 % (ref 11.5–15.5)
WBC: 5.4 10*3/uL (ref 4.0–10.5)
nRBC: 0 % (ref 0.0–0.2)

## 2021-06-12 LAB — COMPREHENSIVE METABOLIC PANEL
ALT: 84 U/L — ABNORMAL HIGH (ref 0–44)
AST: 40 U/L (ref 15–41)
Albumin: 3.8 g/dL (ref 3.5–5.0)
Alkaline Phosphatase: 295 U/L — ABNORMAL HIGH (ref 38–126)
Anion gap: 9 (ref 5–15)
BUN: 19 mg/dL (ref 8–23)
CO2: 25 mmol/L (ref 22–32)
Calcium: 9 mg/dL (ref 8.9–10.3)
Chloride: 106 mmol/L (ref 98–111)
Creatinine, Ser: 0.64 mg/dL (ref 0.44–1.00)
GFR, Estimated: >60 mL/min (ref >60)
Glucose, Bld: 94 mg/dL (ref 70–99)
Potassium: 4.1 mmol/L (ref 3.5–5.1)
Sodium: 140 mmol/L (ref 135–145)
Total Bilirubin: 0.4 mg/dL (ref 0.3–1.2)
Total Protein: 6.7 g/dL (ref 6.5–8.1)

## 2021-06-12 MED ORDER — SODIUM CHLORIDE 0.9 % IV SOLN
1600.0000 mg | Freq: Once | INTRAVENOUS | Status: AC
  Administered 2021-06-12: 1600 mg via INTRAVENOUS
  Filled 2021-06-12: qty 26.3

## 2021-06-12 MED ORDER — PROCHLORPERAZINE MALEATE 10 MG PO TABS
10.0000 mg | ORAL_TABLET | Freq: Once | ORAL | Status: AC
  Administered 2021-06-12: 10 mg via ORAL
  Filled 2021-06-12: qty 1

## 2021-06-12 MED ORDER — PACLITAXEL PROTEIN-BOUND CHEMO INJECTION 100 MG
100.0000 mg/m2 | Freq: Once | INTRAVENOUS | Status: AC
  Administered 2021-06-12: 175 mg via INTRAVENOUS
  Filled 2021-06-12: qty 35

## 2021-06-12 MED ORDER — HEPARIN SOD (PORK) LOCK FLUSH 100 UNIT/ML IV SOLN
INTRAVENOUS | Status: AC
  Filled 2021-06-12: qty 5

## 2021-06-12 MED ORDER — HEPARIN SOD (PORK) LOCK FLUSH 100 UNIT/ML IV SOLN
500.0000 [IU] | Freq: Once | INTRAVENOUS | Status: AC
  Administered 2021-06-12: 500 [IU] via INTRAVENOUS
  Filled 2021-06-12: qty 5

## 2021-06-12 MED ORDER — SODIUM CHLORIDE 0.9% FLUSH
10.0000 mL | Freq: Once | INTRAVENOUS | Status: AC
  Administered 2021-06-12: 10 mL via INTRAVENOUS
  Filled 2021-06-12: qty 10

## 2021-06-12 MED ORDER — SODIUM CHLORIDE 0.9 % IV SOLN
Freq: Once | INTRAVENOUS | Status: AC
  Filled 2021-06-12: qty 250

## 2021-06-12 MED ORDER — GABAPENTIN 100 MG PO CAPS: 100.0000 mg | ORAL_CAPSULE | Freq: Every day | ORAL | 1 refills | Status: DC

## 2021-06-12 NOTE — Progress Notes (Signed)
Hematology/Oncology follow up note [phone] [phone]   Patient Care Team: [name], MD as PCP - General (Family Medicine) Flinchum, Kelby Aline, FNP (Family Medicine) [name], RN as Oncology Nurse Navigator [name], MD as Consulting Physician (Oncology)  REFERRING PROVIDER: [name] [name], MD  CHIEF COMPLAINTS/REASON FOR VISIT:  Follow up for treatment of pancreatic adenocarcinoma  HISTORY OF PRESENTING ILLNESS:   Cassandra [name] is a  65 y.o.  female with PMH listed below was seen in consultation at the request of  [name], MD  for evaluation of pancreatic adenocarcinoma Patient initially presented with jaundice, transaminitis, bilirubin was 9.9.  CA 19-9 was 1874.  Patient also reports unintentional weight loss. 02/08/2020 MRI abdomen and MRCP with and without contrast was done at [facility] which showed pancreatic head mass measuring up to 3 cm, with marked associated narrowing of the portal vein confluence.  SMA is preserved.  Marked intrahepatic and extrahepatic biliary duct dilatation as well as mild dilatation of the main pancreatic duct. Multiple hepatic masses highly concerning for metastatic disease.  Patient underwent EUS on 02/19/2020, which showed irregular mass identified in the pancreatic head, hypoechoic, measured 47mmx33mm, sonographic evidence concerning for invasion into the superior mesenteric artery.  There is no sign of significant abnormality in the main pancreatic duct.  Dilatation of common bile duct which measured up to 16 mm.  Region of celiac artery was visualized and showed no signs of significant abnormality.  No lymphadenopathy.  FNA showed adenocarcinoma.  02/19/2020, ERCP, malignant.  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  The stricture was treated with placement of wall flex metal stent.  Patient was seen by [facility] oncology Dr. Pia Mau and  was recommended for 3 drug regimen FOLFIRINOX.  Patient prefers to do chemotherapy locally at [facility].  Patient was referred to establish care today. She denies any pain.  Since stent placement, skin jaundice has improved.  Itchiness has also improved. Patient was accompanied by her husband today.  She has a family history of breast cancer in sister and paternal aunt, colon cancer paternal grandmother.  #no reportable targetable mutation on NGS 9/14/2021cycle 1 FOLFIRINOX.  Patient received oxaliplatin and about 50% of Irinotecan on day 1 and had experienced neurologic symptoms.  She went to ER and working diagnosis is TIA, and eventually I think this is due to irinotecan side effects -irinotecan-associated dysarthria, lip/tongue numbness.  Adjustment was made for Irinotecan to be  infused over 180 minutes.  Atropine 0.5 mg once prior to the irinotecan. No recurrent symptoms.   # 03/19/2020-03/21/2020 patient was admitted due to sepsis with strep pneumonia bacteremia.  Patient was treated with IV Rocephin.  TEE was done which showed no vegetation.  No PFO or ASD.  Patient was discharged home and he finished full course of 14 days of IV Rocephin on 04/02/2020 per ID recommendation.  Repeat blood culture was also negative.  08/25/20 cycle 10 FOLFIRINOX 09/08/20- present  Starting cycle 11, FOLFIRI, Oxaliplatin discontinued due to neuropathy   #NGS showed no reportable targetable mutation #Genetic testing-[employer] diagnostic testing showed no pathological variants identified. MS stable, TMB 69mut/mb, KRAS G12D, SF3B1 K700E, TP53 V269fs*30  #01/14/2021  CT done [facility] during the interval, stable disease.  # Her case was presented by Dr.Jia from [facility] tumor board. [place] Scrape were 4~5 liver lesions on OSH MRI last year at the time of diagnosis highly suspicious for metastasis. She is  not eligible for surgical protocol for metastasis resection given the number of liver metastasis is >3. However given her excellent  and durable response to chemo, surgical resection may be considered pending sustained disease control at 1 year and may require partial hepatectomy first to see if there is viable tumor before proceeding to Whipple resection ]  Patient had a COVID-19 infection in September 2022.  03/23/2021 CT abdomen pelvis No significant change in the ill-defined pancreatic head mass or  associated biliary ductal and pancreatic ductal dilation.  Slight interval enlargement of subcentimeter anterior peripancreatic  lymph node, nonspecific. Attention on follow-up per clinical protocol. Similar enlargement of the ascending thoracic aorta measuring 4.4 cm  03/23/2021 MRI abdomen w and wo  Increasing ill-defined hypoenhancement of the pancreatic head surrounding the common bile duct stent, in keeping with known pancreatic malignancy. Anterior peripancreatic lymph node is better evaluated on CT.  Unchanged position of a common bile duct stent with similar mild diffuse intrahepatic ductal dilation and pneumobilia. No evidence of metastatic disease in the abdomen or pelvis. Partially visualized cystic left adnexal lesion measuring up to 3.9 cm.   04/10/2021, patient underwent liver resection at [facility], by Dr. [name].   Pathology report from [place] was reviewed. Segments 3 partial hepatectomy, negative for viable tumor. Section 5/6 partial hepatectomy part 1 and 2, microscopic foci of residual viable adenocarcinoma, morphologically consistent with history of pancreatic primary.  Parenchymal margin is uninvolved.  #Patient resumed on FOLFIRI on 04/28/2021. She got another cycle on 05/12/2021 #05/26/2021, patient did not get additional chemotherapy due to transaminitis.  Shared decision was made to stop FOLFIRI and switch to gemcitabine Abraxane treatments.  INTERVAL HISTORY Cassandra [name] is a 65 y.o. female who has above history reviewed by me today presents for pancreatic cancer.  05/26/2021, patient developed transaminitis, AST  763, ALT 691, alkaline phosphatase 364.  Bilirubin 0.7 05/26/2021 stat ultrasound abdomen right upper quadrant showed interval development of 3.3 x 1.8 cm complex mass in the right hepatic lobe.  Increased extrahepatic ductal dilatation.  Concerning for CBD obstruction. 05/29/2021, MRI abdomen MRCP with and without contrast showed unchanged pancreatic head soft tissue.  Severe intra and extrahepatic biliary ductal dilatation.  Common bile duct stents remain in position however patency is not established.  No evidence of lymphadenopathy or metastatic disease in the abdomen.  With further communication with radiologist, addendum was added that there is internal fluid signal and no appreciable associated contrast enhancement measuring 3.4 x 2.3 cm.  This appearance is generally not consistent with metastasis and is of uncertain significance.  Possibly reflecting hepatic abscess or residual of subcapsular hematoma.  Patient was seen by [facility] Dr. Alwyn Pea team.  He had ERCP done on 06/05/2021, with findings of One stent from the biliary tree was seen in the major papilla. The stent had migrated significantly into the duodenum. This is the cause of stent malfunction. One stent was removed from the biliary tree. Prior biliary sphincterotomy appeared open. - A single severe biliary stricture was found in the lower third of the main bile duct with upstream dilation. The stricture was alignant appearing.- The biliary tree was swept and sludge was found.- One fully covered metal stent was placed into the common bile duct across the stricture.- No pancreatogram performed.  Since the surgery, patient reports feeling well.  Her transaminitis has trended down when she had a blood work done at [facility].  No fever or chills, nausea vomiting diarrhea.  She has some neuropathy symptoms, previously she  did not tolerate low-dose Cymbalta   Review of Systems  Constitutional:  Negative for appetite change, chills, fatigue, fever and  unexpected weight change.  HENT:   Negative for hearing loss and voice change.   Eyes:  Negative for eye problems.  Respiratory:  Negative for chest tightness and cough.   Cardiovascular:  Negative for chest pain.  Gastrointestinal:  Negative for abdominal distention, abdominal pain and blood in stool.  Endocrine: Negative for hot flashes.  Genitourinary:  Negative for difficulty urinating and frequency.   Musculoskeletal:  Negative for arthralgias.  Skin:  Negative for itching and rash.  Neurological:  Positive for numbness. Negative for extremity weakness.  Hematological:  Negative for adenopathy.  Psychiatric/Behavioral:  Negative for confusion.    MEDICAL HISTORY:  Past Medical History:  Diagnosis Date   Cancer [facility])    pancreatic cancer   Colon polyps    Family history of breast cancer    Neutropenia ([place]) 04/18/2020   Osteopenia after menopause 05/2017   femoral neck T score -2.0   Personal history of chemotherapy    current for pancreatic ca    SURGICAL HISTORY: Past Surgical History:  Procedure Laterality Date   COLONOSCOPY  07/2015   WNL   COLONOSCOPY  2008/2011   IR CV LINE INJECTION  04/28/2021   OVARIAN CYST REMOVAL  1992   dermoid-Dr CAK   PORTA CATH INSERTION N/A 03/07/2020   Procedure: PORTA CATH INSERTION;  Surgeon: Algernon Huxley, MD;  Location: [place] CV LAB;  Service: Cardiovascular;  Laterality: N/A;   TEE WITHOUT CARDIOVERSION N/A 03/21/2020   Procedure: TRANSESOPHAGEAL ECHOCARDIOGRAM (TEE);  Surgeon: Dionisio David, MD;  Location: [facility] ORS;  Service: Cardiovascular;  Laterality: N/A;   TUBAL LIGATION  1993    SOCIAL HISTORY: Social History   Socioeconomic History   Marital status: Married    Spouse name: Not on file   Number of children: 2   Years of education: Not on file   Highest education level: Not on file  Occupational History   Occupation: Pharmacist, hospital    Comment: retired   Occupation: Psychologist, sport and exercise  Tobacco Use   Smoking status: Former     Packs/day: 0.50    Years: 10.00    Pack years: 5.00    Types: Cigarettes    Quit date: 1987    Years since quitting: 35.9   Smokeless tobacco: Never   Tobacco comments:    Quit smoking 1987  Vaping Use   Vaping Use: Never used  Substance and Sexual Activity   Alcohol use: Not Currently    Comment: 0-2 mixed drinks a day   Drug use: No   Sexual activity: Not Currently    Birth control/protection: Post-menopausal  Other Topics Concern   Not on file  Social History Narrative   Not on file   Social Determinants of Health   Financial Resource Strain: Not on file  Food Insecurity: Not on file  Transportation Needs: Not on file  Physical Activity: Not on file  Stress: Not on file  Social Connections: Not on file  Intimate Partner Violence: Not on file    FAMILY HISTORY: Family History  Problem Relation Age of Onset   Breast cancer Paternal Aunt 27   Diabetes Mother    Osteoporosis Mother    Hyperlipidemia Father    Rheumatic fever Father    Valvular heart disease Father    Cancer Paternal Grandmother        possible colon   Breast  cancer Sister 18    ALLERGIES:  is allergic to penicillin g.  MEDICATIONS:  Current Outpatient Medications  Medication Sig Dispense Refill   Calcium Carbonate-Vit D-Min (CALCIUM 1200 PO) Take by mouth.     Cholecalciferol 25 MCG (1000 UT) tablet Take 2,000 Units by mouth daily.     Cyanocobalamin (VITAMIN B 12 PO) Take by mouth.     gabapentin (NEURONTIN) 100 MG capsule Take 1 capsule (100 mg total) by mouth at bedtime. 30 capsule 1   Multiple Vitamins-Minerals (MULTIVITAMIN WITH MINERALS) tablet Take 1 tablet by mouth daily.     Pancrelipase, Lip-Prot-Amyl, (CREON) 24000-76000 units CPEP Take 1 capsule (24,000 Units total) by mouth 3 (three) times daily before meals. 90 capsule 3   simethicone (GAS-X) 80 MG chewable tablet Chew 1 tablet (80 mg total) by mouth every 8 (eight) hours as needed for flatulence. 60 tablet 0   magic mouthwash  w/lidocaine SOLN Take 5 mLs by mouth 4 (four) times daily as needed for mouth pain. Sig: Swish & Spit 5-10 ml four times a day as needed. Dispense 480 ml. 1RF (Patient not taking: Reported on 12/17/2020) 480 mL 1   potassium chloride SA (KLOR-CON M20) 20 MEQ tablet Take 1 tablet (20 mEq total) by mouth daily. (Patient not taking: Reported on 03/25/2021) 30 tablet 0   No current facility-administered medications for this visit.   Facility-Administered Medications Ordered in Other Visits  Medication Dose Route Frequency Provider Last Rate Last Admin   sodium chloride flush (NS) 0.9 % injection 10 mL  10 mL Intravenous PRN [name], MD   10 mL at 02/18/21 0858   sodium chloride flush (NS) 0.9 % injection 10 mL  10 mL Intravenous Once Borders, Vonna Kotyk R, NP       sodium chloride flush (NS) 0.9 % injection 10 mL  10 mL Intravenous Once [name], MD         PHYSICAL EXAMINATION: ECOG PERFORMANCE STATUS: 0 - Asymptomatic Vitals:   06/12/21 0836  BP: 117/77  Pulse: 66  Resp: 18  Temp: (!) 97.4 F (36.3 C)   Filed Weights   06/12/21 0836  Weight: 136 lb 1.6 oz (61.7 kg)    Physical Exam Constitutional:      General: She is not in acute distress. HENT:     Head: Normocephalic and atraumatic.  Eyes:     General: No scleral icterus. Cardiovascular:     Rate and Rhythm: Normal rate and regular rhythm.     Heart sounds: Normal heart sounds.  Pulmonary:     Effort: Pulmonary effort is normal. No respiratory distress.     Breath sounds: No wheezing.  Abdominal:     General: Bowel sounds are normal. There is no distension.     Palpations: Abdomen is soft.  Musculoskeletal:        General: No deformity. Normal range of motion.     Cervical back: Normal range of motion and neck supple.  Skin:    General: Skin is warm and dry.     Coloration: Skin is not jaundiced.     Findings: No erythema or rash.  Neurological:     Mental Status: She is alert and oriented to person, place, and time.  Mental status is at baseline.     Cranial Nerves: No cranial nerve deficit.     Coordination: Coordination normal.  Psychiatric:        Mood and Affect: Mood normal.    LABORATORY DATA:  I  have reviewed the data as listed Lab Results  Component Value Date   WBC 5.4 06/12/2021   HGB 12.7 06/12/2021   HCT 37.3 06/12/2021   MCV 95.9 06/12/2021   PLT 182 06/12/2021   Recent Labs    05/12/21 0809 05/26/21 0803 06/12/21 0816  NA 140 138 140  K 4.0 3.9 4.1  CL 104 104 106  CO2 $Re'25 24 25  'aBs$ GLUCOSE 81 160* 94  BUN $Re'19 17 19  'tzw$ CREATININE 0.68 0.68 0.64  CALCIUM 8.8* 8.8* 9.0  GFRNONAA >60 >60 >60  PROT 6.9 6.4* 6.7  ALBUMIN 4.1 3.9 3.8  AST 28 763* 40  ALT 29 691* 84*  ALKPHOS 112 364* 295*  BILITOT 0.4 0.7 0.4    Iron/TIBC/Ferritin/ %Sat No results found for: IRON, TIBC, FERRITIN, IRONPCTSAT    RADIOGRAPHIC STUDIES: I have personally reviewed the radiological images as listed and agreed with the findings in the report. Reviewed findings of MRI abdomen MRCP done at [facility]. US Abdomen Complete  Result Date: 03/19/2021 CLINICAL DATA:  Abdominal pain and transaminitis. History of pancreas cancer. EXAM: ABDOMEN ULTRASOUND COMPLETE COMPARISON:  MRI 06/11/2020 FINDINGS: Gallbladder: Gallbladder sludge noted. No gallstones or pericholecystic fluid. Mild diffuse gallbladder wall thickening measuring between 3 and 5 mm. Negative sonographic Murphy's sign. Common bile duct: Diameter: Measures 8.5 mm proximally and up to 17 mm near the head of pancreas. Known common bile duct stent is difficult to visualize. Liver: Intrahepatic bile duct dilatation. Normal parenchymal echogenicity. No mass. Portal vein is patent on color Doppler imaging with normal direction of blood flow towards the liver. IVC: No abnormality visualized. Pancreas: The head of the pancreas is prominent compatible with the history of known pancreatic adenocarcinoma. Spleen: Size and appearance within normal limits. Right Kidney:  Length: 10.3 cm. Echogenicity within normal limits. No mass or hydronephrosis visualized. Left Kidney: Length: 10.0 cm. Echogenicity within normal limits. No mass or hydronephrosis visualized. Abdominal aorta: No aneurysm visualized. Other findings: None. IMPRESSION: 1. Intrahepatic and extrahepatic bile duct dilatation. This is similar to the CT from 10/30/2020. Known common bile duct stent is difficult to visualize. 2. Gallbladder sludge with mild diffuse gallbladder wall thickening. No gallstones, pericholecystic fluid, or sonographic Murphy's sign. Electronically Signed   By: Kerby Moors M.D.   On: 03/19/2021 10:22   MR 3D Recon At Scanner  Addendum Date: 05/29/2021   ADDENDUM REPORT: 05/29/2021 08:43 ADDENDUM: There is a subcapsular, nonenhancing lesion of the inferior tip of the right lobe of the liver, hepatic segment VII, adjacent to the gallbladder fossa, with internal fluid signal and no appreciable associated contrast enhancement measuring 3.4 x 2.3 cm. This appearance is generally not consistent with metastasis and is of uncertain significance, possibly reflecting a hepatic abscess in the setting of biliary obstruction or residua of a subcapsular hematoma. Findings discussed with Dr. Tasia Catchings on 05/27/2021 and 05/29/2021. Electronically Signed   By: Delanna Ahmadi M.D.   On: 05/29/2021 08:43   Result Date: 05/29/2021 CLINICAL DATA:  Pancreatic cancer, elevated LFTs EXAM: MRI ABDOMEN WITHOUT AND WITH CONTRAST (INCLUDING MRCP) TECHNIQUE: Multiplanar multisequence MR imaging of the abdomen was performed both before and after the administration of intravenous contrast. Heavily T2-weighted images of the biliary and pancreatic ducts were obtained, and three-dimensional MRCP images were rendered by post processing. CONTRAST:  59mL GADAVIST GADOBUTROL 1 MMOL/ML IV SOLN COMPARISON:  CT chest abdomen pelvis, 10/31/2020 FINDINGS: Lower chest: No acute findings. Hepatobiliary: Severe intra and extrahepatic biliary  ductal dilatation, increased compared to prior  CT. The common bile duct measures 1.8 cm, previously 1.1 cm. Common bile duct stent remains in position. Portal veins are patent. Pancreas: Unchanged post treatment appearance of pancreatic head soft tissue, although ill-defined measuring approximately 2.9 x 2.6 cm (series 14, image 40). Severe atrophy of the pancreatic parenchyma distally with prominence of the pancreatic duct. Spleen:  Within normal limits in size and appearance. Adrenals/Urinary Tract: No masses identified. No evidence of hydronephrosis. Stomach/Bowel: Visualized portions within the abdomen are unremarkable. Vascular/Lymphatic: No pathologically enlarged lymph nodes identified. No abdominal aortic aneurysm demonstrated. Other:  None. Musculoskeletal: No suspicious bone lesions identified. IMPRESSION: 1. Unchanged post treatment appearance of pancreatic head soft tissue, in keeping with primary pancreatic malignancy. 2. Severe intra and extrahepatic biliary ductal dilatation, increased compared to prior CT. Common bile duct stent remains in position, however patency is not established on this MR examination. 3. No evidence of lymphadenopathy or metastatic disease in the abdomen. Electronically Signed: By: Delanna Ahmadi M.D. On: 05/27/2021 13:31   IR CV Line Injection  Result Date: 04/28/2021 CLINICAL DATA:  no blood return EXAM: PORT  CATHETER INJECTION UNDER FLUOROSCOPY TECHNIQUE: The procedure, risks (including but not limited to bleeding, infection, organ damage ), benefits, and alternatives were explained to the patient. Questions regarding the procedure were encouraged and answered. The patient understands and consents to the procedure. Survey fluoroscopic inspection reveals good position of right IJ power injectable port catheter, tip in the proximal right atrium. No catheter discontinuity or fracture. Injection demonstrates patency of the system. No extravasation. No evidence of fibrin  sheath. Contrast flows freely into the right atrium. IMPRESSION: 1. Normal appearance and function of right IJ power injectable port catheter. Okay for routine use. Consider Cathflo dwell per protocol if aspiration issues persist. Electronically Signed   By: Lucrezia Europe M.D.   On: 04/28/2021 11:20   MR ABDOMEN MRCP W WO CONTAST  Addendum Date: 05/29/2021   ADDENDUM REPORT: 05/29/2021 08:43 ADDENDUM: There is a subcapsular, nonenhancing lesion of the inferior tip of the right lobe of the liver, hepatic segment VII, adjacent to the gallbladder fossa, with internal fluid signal and no appreciable associated contrast enhancement measuring 3.4 x 2.3 cm. This appearance is generally not consistent with metastasis and is of uncertain significance, possibly reflecting a hepatic abscess in the setting of biliary obstruction or residua of a subcapsular hematoma. Findings discussed with Dr. Tasia Catchings on 05/27/2021 and 05/29/2021. Electronically Signed   By: Delanna Ahmadi M.D.   On: 05/29/2021 08:43   Result Date: 05/29/2021 CLINICAL DATA:  Pancreatic cancer, elevated LFTs EXAM: MRI ABDOMEN WITHOUT AND WITH CONTRAST (INCLUDING MRCP) TECHNIQUE: Multiplanar multisequence MR imaging of the abdomen was performed both before and after the administration of intravenous contrast. Heavily T2-weighted images of the biliary and pancreatic ducts were obtained, and three-dimensional MRCP images were rendered by post processing. CONTRAST:  29mL GADAVIST GADOBUTROL 1 MMOL/ML IV SOLN COMPARISON:  CT chest abdomen pelvis, 10/31/2020 FINDINGS: Lower chest: No acute findings. Hepatobiliary: Severe intra and extrahepatic biliary ductal dilatation, increased compared to prior CT. The common bile duct measures 1.8 cm, previously 1.1 cm. Common bile duct stent remains in position. Portal veins are patent. Pancreas: Unchanged post treatment appearance of pancreatic head soft tissue, although ill-defined measuring approximately 2.9 x 2.6 cm (series 14,  image 40). Severe atrophy of the pancreatic parenchyma distally with prominence of the pancreatic duct. Spleen:  Within normal limits in size and appearance. Adrenals/Urinary Tract: No masses identified. No evidence of hydronephrosis. Stomach/Bowel:  Visualized portions within the abdomen are unremarkable. Vascular/Lymphatic: No pathologically enlarged lymph nodes identified. No abdominal aortic aneurysm demonstrated. Other:  None. Musculoskeletal: No suspicious bone lesions identified. IMPRESSION: 1. Unchanged post treatment appearance of pancreatic head soft tissue, in keeping with primary pancreatic malignancy. 2. Severe intra and extrahepatic biliary ductal dilatation, increased compared to prior CT. Common bile duct stent remains in position, however patency is not established on this MR examination. 3. No evidence of lymphadenopathy or metastatic disease in the abdomen. Electronically Signed: By: Delanna Ahmadi M.D. On: 05/27/2021 13:31   US Abdomen Limited RUQ (LIVER/GB)  Result Date: 05/26/2021 CLINICAL DATA:  Abnormal liver enzymes. EXAM: ULTRASOUND ABDOMEN LIMITED RIGHT UPPER QUADRANT COMPARISON:  March 19, 2021.  Oct 30, 2020. FINDINGS: Gallbladder: No gallstones or wall thickening visualized. No sonographic Murphy sign noted by sonographer. Common bile duct: Diameter: 16 mm which is increased compared to prior exam. Liver: 3.3 x 1.8 cm complex mass is seen in right hepatic lobe peripherally. Mild intrahepatic biliary dilatation is noted. Within normal limits in parenchymal echogenicity. Portal vein is patent on color Doppler imaging with normal direction of blood flow towards the liver. Other: None. IMPRESSION: Interval development of 3.3 x 1.8 cm complex mass in right hepatic lobe. CT or MRI with intravenous contrast is recommended to evaluate for metastatic disease or other neoplasm. Increased extrahepatic ductal dilatation is noted concerning for distal common bile duct obstruction. Correlation  with liver function tests is recommended. Electronically Signed   By: Marijo Conception M.D.   On: 05/26/2021 14:18      ASSESSMENT & PLAN:  1. Primary pancreatic cancer ([place])   2. 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 Pancreatic insufficiency   5. 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  Pathology proved residual disease  Labs reviewed and discussed with patient. We discussed about the rationale for switching to gemcitabine and Abraxane.  Also discussed about potential side effects.  She agrees with the plan. Proceed with cycle 1 gemcitabine Abraxane.  Dose reduced Abraxane due to neuropathy . # Elevated LFT, bilirubin is normal, transaminitis that has improved.  Continue trending. #Chemotherapy-induced neuropathy of fingertips and toes, grade 2.  Follow-up with neurology.  Not tolerating Cymbalta.  Dose reduced Abraxane  #pancreatic insufficiency.  Continue Creon,continue  24000 units TID with meals.   #Elevated CEA,-  Patient is due for colonoscopy and has appt with gastroenterology for colonoscopy.  All questions were answered. The patient knows to call the clinic with any problems questions or concerns.  Return of visit: Patient prefers to take a break during Christmas week.  She will come back in approximately 2 weeks for the next treatment of gemcitabine and Abraxane.  [name], MD, PhD  06/12/2021

## 2021-06-12 NOTE — Progress Notes (Signed)
Pt here for follow up. Pt reports she has been eating and feeling well.

## 2021-06-12 NOTE — Patient Instructions (Addendum)
[facility] [place]  Discharge Instructions: Thank you for choosing [facility] to provide your oncology and hematology care.  If you have a lab appointment with the [facility], please go directly to the [facility] and check in at the registration area.  Wear comfortable clothing and clothing appropriate for easy access to any Portacath or PICC line.   We strive to give you quality time with your provider. You may need to reschedule your appointment if you arrive late (15 or more minutes).  Arriving late affects you and other patients whose appointments are after yours.  Also, if you miss three or more appointments without notifying the office, you may be dismissed from the clinic at the providers discretion.      For prescription refill requests, have your pharmacy contact our office and allow 72 hours for refills to be completed.    Today you received the following chemotherapy and/or immunotherapy agents : Abraxane/ Gemzar     Gemcitabine injection What is this medication? GEMCITABINE (jem SYE ta been) is a chemotherapy drug. This medicine is used to treat many types of cancer like breast cancer, lung cancer, pancreatic cancer, and ovarian cancer. This medicine may be used for other purposes; ask your health care provider or pharmacist if you have questions. COMMON BRAND NAME(S): Gemzar, Infugem What should I tell my care team before I take this medication? They need to know if you have any of these conditions: blood disorders infection kidney disease liver disease lung or breathing disease, like asthma recent or ongoing radiation therapy an unusual or allergic reaction to gemcitabine, other chemotherapy, other medicines, foods, dyes, or preservatives pregnant or trying to get pregnant breast-feeding How should I use this medication? This drug is given as an infusion into a vein. It is administered in a hospital or clinic by a  specially trained health care professional. Talk to your pediatrician regarding the use of this medicine in children. Special care may be needed. Overdosage: If you think you have taken too much of this medicine contact a poison control center or emergency room at once. NOTE: This medicine is only for you. Do not share this medicine with others. What if I miss a dose? It is important not to miss your dose. Call your doctor or health care professional if you are unable to keep an appointment. What may interact with this medication? medicines to increase blood counts like filgrastim, pegfilgrastim, sargramostim some other chemotherapy drugs like cisplatin vaccines Talk to your doctor or health care professional before taking any of these medicines: acetaminophen aspirin ibuprofen ketoprofen naproxen This list may not describe all possible interactions. Give your health care provider a list of all the medicines, herbs, non-prescription drugs, or dietary supplements you use. Also tell them if you smoke, drink alcohol, or use illegal drugs. Some items may interact with your medicine. What should I watch for while using this medication? Visit your doctor for checks on your progress. This drug may make you feel generally unwell. This is not uncommon, as chemotherapy can affect healthy cells as well as cancer cells. Report any side effects. Continue your course of treatment even though you feel ill unless your doctor tells you to stop. In some cases, you may be given additional medicines to help with side effects. Follow all directions for their use. Call your doctor or health care professional for advice if you get a fever, chills or sore throat, or other symptoms of a cold  or flu. Do not treat yourself. This drug decreases your body's ability to fight infections. Try to avoid being around people who are sick. This medicine may increase your risk to bruise or bleed. Call your doctor or health care  professional if you notice any unusual bleeding. Be careful brushing and flossing your teeth or using a toothpick because you may get an infection or bleed more easily. If you have any dental work done, tell your dentist you are receiving this medicine. Avoid taking products that contain aspirin, acetaminophen, ibuprofen, naproxen, or ketoprofen unless instructed by your doctor. These medicines may hide a fever. Do not become pregnant while taking this medicine or for 6 months after stopping it. Women should inform their doctor if they wish to become pregnant or think they might be pregnant. Men should not father a child while taking this medicine and for 3 months after stopping it. There is a potential for serious side effects to an unborn child. Talk to your health care professional or pharmacist for more information. Do not breast-feed an infant while taking this medicine or for at least 1 week after stopping it. Men should inform their doctors if they wish to father a child. This medicine may lower sperm counts. Talk with your doctor or health care professional if you are concerned about your fertility. What side effects may I notice from receiving this medication? Side effects that you should report to your doctor or health care professional as soon as possible: allergic reactions like skin rash, itching or hives, swelling of the face, lips, or tongue breathing problems pain, redness, or irritation at site where injected signs and symptoms of a dangerous change in heartbeat or heart rhythm like chest pain; dizziness; fast or irregular heartbeat; palpitations; feeling faint or lightheaded, falls; breathing problems signs of decreased platelets or bleeding - bruising, pinpoint red spots on the skin, black, tarry stools, blood in the urine signs of decreased red blood cells - unusually weak or tired, feeling faint or lightheaded, falls signs of infection - fever or chills, cough, sore throat, pain or  difficulty passing urine signs and symptoms of kidney injury like trouble passing urine or change in the amount of urine signs and symptoms of liver injury like dark yellow or brown urine; general ill feeling or flu-like symptoms; light-colored stools; loss of appetite; nausea; right upper belly pain; unusually weak or tired; yellowing of the eyes or skin swelling of ankles, feet, hands Side effects that usually do not require medical attention (report to your doctor or health care professional if they continue or are bothersome): constipation diarrhea hair loss loss of appetite nausea rash vomiting This list may not describe all possible side effects. Call your doctor for medical advice about side effects. You may report side effects to FDA at 1-800-FDA-1088. Where should I keep my medication? This drug is given in a hospital or clinic and will not be stored at home. NOTE: This sheet is a summary. It may not cover all possible information. If you have questions about this medicine, talk to your doctor, pharmacist, or health care provider.  2022 Elsevier/Gold Standard (2017-09-07 00:00:00)       Nanoparticle Albumin-Bound Paclitaxel injection What is this medication? NANOPARTICLE ALBUMIN-BOUND PACLITAXEL (Na no PAHR ti kuhl  al BYOO muhn-bound  PAK li TAX el) is a chemotherapy drug. It targets fast dividing cells, like cancer cells, and causes these cells to die. This medicine is used to treat advanced breast cancer, lung cancer, and  pancreatic cancer. This medicine may be used for other purposes; ask your health care provider or pharmacist if you have questions. COMMON BRAND NAME(S): Abraxane What should I tell my care team before I take this medication? They need to know if you have any of these conditions: kidney disease liver disease low blood counts, like low white cell, platelet, or red cell counts lung or breathing disease, like asthma tingling of the fingers or toes, or  other nerve disorder an unusual or allergic reaction to paclitaxel, albumin, other chemotherapy, other medicines, foods, dyes, or preservatives pregnant or trying to get pregnant breast-feeding How should I use this medication? This drug is given as an infusion into a vein. It is administered in a hospital or clinic by a specially trained health care professional. Talk to your pediatrician regarding the use of this medicine in children. Special care may be needed. Overdosage: If you think you have taken too much of this medicine contact a poison control center or emergency room at once. NOTE: This medicine is only for you. Do not share this medicine with others. What if I miss a dose? It is important not to miss your dose. Call your doctor or health care professional if you are unable to keep an appointment. What may interact with this medication? This medicine may interact with the following medications: antiviral medicines for hepatitis, HIV or AIDS certain antibiotics like erythromycin and clarithromycin certain medicines for fungal infections like ketoconazole and itraconazole certain medicines for seizures like carbamazepine, phenobarbital, phenytoin gemfibrozil nefazodone rifampin St. John's wort This list may not describe all possible interactions. Give your health care provider a list of all the medicines, herbs, non-prescription drugs, or dietary supplements you use. Also tell them if you smoke, drink alcohol, or use illegal drugs. Some items may interact with your medicine. What should I watch for while using this medication? Your condition will be monitored carefully while you are receiving this medicine. You will need important blood work done while you are taking this medicine. This medicine can cause serious allergic reactions. If you experience allergic reactions like skin rash, itching or hives, swelling of the face, lips, or tongue, tell your doctor or health care  professional right away. In some cases, you may be given additional medicines to help with side effects. Follow all directions for their use. This drug may make you feel generally unwell. This is not uncommon, as chemotherapy can affect healthy cells as well as cancer cells. Report any side effects. Continue your course of treatment even though you feel ill unless your doctor tells you to stop. Call your doctor or health care professional for advice if you get a fever, chills or sore throat, or other symptoms of a cold or flu. Do not treat yourself. This drug decreases your body's ability to fight infections. Try to avoid being around people who are sick. This medicine may increase your risk to bruise or bleed. Call your doctor or health care professional if you notice any unusual bleeding. Be careful brushing and flossing your teeth or using a toothpick because you may get an infection or bleed more easily. If you have any dental work done, tell your dentist you are receiving this medicine. Avoid taking products that contain aspirin, acetaminophen, ibuprofen, naproxen, or ketoprofen unless instructed by your doctor. These medicines may hide a fever. Do not become pregnant while taking this medicine or for 6 months after stopping it. Women should inform their doctor if they wish to become  pregnant or think they might be pregnant. Men should not father a child while taking this medicine or for 3 months after stopping it. There is a potential for serious side effects to an unborn child. Talk to your health care professional or pharmacist for more information. Do not breast-feed an infant while taking this medicine or for 2 weeks after stopping it. This medicine may interfere with the ability to get pregnant or to father a child. You should talk to your doctor or health care professional if you are concerned about your fertility. What side effects may I notice from receiving this medication? Side effects  that you should report to your doctor or health care professional as soon as possible: allergic reactions like skin rash, itching or hives, swelling of the face, lips, or tongue breathing problems changes in vision fast, irregular heartbeat low blood pressure mouth sores pain, tingling, numbness in the hands or feet signs of decreased platelets or bleeding - bruising, pinpoint red spots on the skin, black, tarry stools, blood in the urine signs of decreased red blood cells - unusually weak or tired, feeling faint or lightheaded, falls signs of infection - fever or chills, cough, sore throat, pain or difficulty passing urine signs and symptoms of liver injury like dark yellow or brown urine; general ill feeling or flu-like symptoms; light-colored stools; loss of appetite; nausea; right upper belly pain; unusually weak or tired; yellowing of the eyes or skin swelling of the ankles, feet, hands unusually slow heartbeat Side effects that usually do not require medical attention (report to your doctor or health care professional if they continue or are bothersome): diarrhea hair loss loss of appetite nausea, vomiting tiredness This list may not describe all possible side effects. Call your doctor for medical advice about side effects. You may report side effects to FDA at 1-800-FDA-1088. Where should I keep my medication? This drug is given in a hospital or clinic and will not be stored at home. NOTE: This sheet is a summary. It may not cover all possible information. If you have questions about this medicine, talk to your doctor, pharmacist, or health care provider.  2022 Elsevier/Gold Standard (2017-02-15 00:00:00)     To help prevent nausea and vomiting after your treatment, we encourage you to take your nausea medication as directed.  BELOW ARE SYMPTOMS THAT SHOULD BE REPORTED IMMEDIATELY: *FEVER GREATER THAN 100.4 F (38 C) OR HIGHER *CHILLS OR SWEATING *NAUSEA AND VOMITING THAT IS  NOT CONTROLLED WITH YOUR NAUSEA MEDICATION *UNUSUAL SHORTNESS OF BREATH *UNUSUAL BRUISING OR BLEEDING *URINARY PROBLEMS (pain or burning when urinating, or frequent urination) *BOWEL PROBLEMS (unusual diarrhea, constipation, pain near the anus) TENDERNESS IN MOUTH AND THROAT WITH OR WITHOUT PRESENCE OF ULCERS (sore throat, sores in mouth, or a toothache) UNUSUAL RASH, SWELLING OR PAIN  UNUSUAL VAGINAL DISCHARGE OR ITCHING   Items with * indicate a potential emergency and should be followed up as soon as possible or go to the Emergency Department if any problems should occur.  Please show the CHEMOTHERAPY ALERT CARD or IMMUNOTHERAPY ALERT CARD at check-in to the Emergency Department and triage nurse.  Should you have questions after your visit or need to cancel or reschedule your appointment, please contact [facility] [place] [facility] [place]  [phone] and follow the prompts.  Office hours are 8:00 a.m. to 4:30 p.m. Monday - Friday. Please note that voicemails left after 4:00 p.m. may not be returned until the following business day.  We are closed weekends  and major holidays. You have access to a nurse at all times for urgent questions. Please call the main number to the clinic [phone] and follow the prompts.  For any non-urgent questions, you may also contact your provider using MyChart. We now offer e-Visits for anyone 70 and older to request care online for non-urgent symptoms. For details visit [email or web address].   Also download the MyChart app! Go to the app store, search "MyChart", open the app, select [facility], and log in with your MyChart username and password.  Due to Covid, a mask is required upon entering the hospital/clinic. If you do not have a mask, one will be given to you upon arrival. For doctor visits, patients may have 1 support person aged 36 or older with them. For treatment visits, patients cannot have anyone with them due to current Covid  guidelines and our immunocompromised population.

## 2021-06-13 LAB — CANCER ANTIGEN 19-9: CA 19-9: 8 U/mL (ref 0–35)

## 2021-06-26 ENCOUNTER — Encounter: Payer: Self-pay | Admitting: Oncology

## 2021-06-26 ENCOUNTER — Inpatient Hospital Stay (HOSPITAL_BASED_OUTPATIENT_CLINIC_OR_DEPARTMENT_OTHER): Payer: Medicare Other | Admitting: Oncology

## 2021-06-26 ENCOUNTER — Other Ambulatory Visit: Payer: Self-pay

## 2021-06-26 ENCOUNTER — Inpatient Hospital Stay: Payer: Medicare Other

## 2021-06-26 VITALS — BP 123/77 | HR 60 | Temp 98.3°F | Wt 137.2 lb

## 2021-06-26 DIAGNOSIS — K8689 Other specified diseases of pancreas: Secondary | ICD-10-CM | POA: Insufficient documentation

## 2021-06-26 DIAGNOSIS — D6481 Anemia due to antineoplastic chemotherapy: Secondary | ICD-10-CM

## 2021-06-26 DIAGNOSIS — Z5111 Encounter for antineoplastic chemotherapy: Secondary | ICD-10-CM | POA: Diagnosis not present

## 2021-06-26 DIAGNOSIS — G620 Drug-induced polyneuropathy: Secondary | ICD-10-CM

## 2021-06-26 DIAGNOSIS — T451X5A Adverse effect of antineoplastic and immunosuppressive drugs, initial encounter: Secondary | ICD-10-CM

## 2021-06-26 DIAGNOSIS — C259 Malignant neoplasm of pancreas, unspecified: Secondary | ICD-10-CM

## 2021-06-26 LAB — COMPREHENSIVE METABOLIC PANEL
ALT: 27 U/L (ref 0–44)
AST: 26 U/L (ref 15–41)
Albumin: 3.7 g/dL (ref 3.5–5.0)
Alkaline Phosphatase: 148 U/L — ABNORMAL HIGH (ref 38–126)
Anion gap: 8 (ref 5–15)
BUN: 19 mg/dL (ref 8–23)
CO2: 26 mmol/L (ref 22–32)
Calcium: 8.8 mg/dL — ABNORMAL LOW (ref 8.9–10.3)
Chloride: 104 mmol/L (ref 98–111)
Creatinine, Ser: 0.71 mg/dL (ref 0.44–1.00)
GFR, Estimated: >60 mL/min (ref >60)
Glucose, Bld: 99 mg/dL (ref 70–99)
Potassium: 4.3 mmol/L (ref 3.5–5.1)
Sodium: 138 mmol/L (ref 135–145)
Total Bilirubin: 0.3 mg/dL (ref 0.3–1.2)
Total Protein: 6.4 g/dL — ABNORMAL LOW (ref 6.5–8.1)

## 2021-06-26 LAB — CBC WITH DIFFERENTIAL/PLATELET
Abs Immature Granulocytes: 0.01 10*3/uL (ref 0.00–0.07)
Basophils Absolute: 0 10*3/uL (ref 0.0–0.1)
Basophils Relative: 1 %
Eosinophils Absolute: 0.2 10*3/uL (ref 0.0–0.5)
Eosinophils Relative: 4 %
HCT: 35 % — ABNORMAL LOW (ref 36.0–46.0)
Hemoglobin: 11.9 g/dL — ABNORMAL LOW (ref 12.0–15.0)
Immature Granulocytes: 0 %
Lymphocytes Relative: 35 %
Lymphs Abs: 1.5 10*3/uL (ref 0.7–4.0)
MCH: 32.6 pg (ref 26.0–34.0)
MCHC: 34 g/dL (ref 30.0–36.0)
MCV: 95.9 fL (ref 80.0–100.0)
Monocytes Absolute: 0.7 10*3/uL (ref 0.1–1.0)
Monocytes Relative: 15 %
Neutro Abs: 1.9 10*3/uL (ref 1.7–7.7)
Neutrophils Relative %: 45 %
Platelets: 174 10*3/uL (ref 150–400)
RBC: 3.65 MIL/uL — ABNORMAL LOW (ref 3.87–5.11)
RDW: 13 % (ref 11.5–15.5)
WBC: 4.3 10*3/uL (ref 4.0–10.5)
nRBC: 0 % (ref 0.0–0.2)

## 2021-06-26 MED ORDER — SODIUM CHLORIDE 0.9 % IV SOLN
1600.0000 mg | Freq: Once | INTRAVENOUS | Status: AC
  Administered 2021-06-26: 12:00:00 1600 mg via INTRAVENOUS
  Filled 2021-06-26: qty 26.3

## 2021-06-26 MED ORDER — SODIUM CHLORIDE 0.9% FLUSH
10.0000 mL | Freq: Once | INTRAVENOUS | Status: AC
  Administered 2021-06-26: 09:00:00 10 mL via INTRAVENOUS
  Filled 2021-06-26: qty 10

## 2021-06-26 MED ORDER — HEPARIN SOD (PORK) LOCK FLUSH 100 UNIT/ML IV SOLN
INTRAVENOUS | Status: AC
  Filled 2021-06-26: qty 5

## 2021-06-26 MED ORDER — PROCHLORPERAZINE MALEATE 10 MG PO TABS
10.0000 mg | ORAL_TABLET | Freq: Once | ORAL | Status: AC
  Administered 2021-06-26: 10:00:00 10 mg via ORAL
  Filled 2021-06-26: qty 1

## 2021-06-26 MED ORDER — PACLITAXEL PROTEIN-BOUND CHEMO INJECTION 100 MG
100.0000 mg/m2 | Freq: Once | INTRAVENOUS | Status: AC
  Administered 2021-06-26: 11:00:00 175 mg via INTRAVENOUS
  Filled 2021-06-26: qty 35

## 2021-06-26 MED ORDER — HEPARIN SOD (PORK) LOCK FLUSH 100 UNIT/ML IV SOLN
500.0000 [IU] | Freq: Once | INTRAVENOUS | Status: DC | PRN
  Filled 2021-06-26: qty 5

## 2021-06-26 MED ORDER — HEPARIN SOD (PORK) LOCK FLUSH 100 UNIT/ML IV SOLN
500.0000 [IU] | Freq: Once | INTRAVENOUS | Status: AC
  Administered 2021-06-26: 12:00:00 500 [IU] via INTRAVENOUS
  Filled 2021-06-26: qty 5

## 2021-06-26 MED ORDER — SODIUM CHLORIDE 0.9 % IV SOLN
Freq: Once | INTRAVENOUS | Status: AC
  Filled 2021-06-26: qty 250

## 2021-06-26 NOTE — Patient Instructions (Signed)
[facility] [place]  Discharge Instructions: Thank you for choosing [name] to provide your oncology and hematology care.  If you have a lab appointment with the [facility], please go directly to the [facility] and check in at the registration area.  Wear comfortable clothing and clothing appropriate for easy access to any Portacath or PICC line.   We strive to give you quality time with your provider. You may need to reschedule your appointment if you arrive late (15 or more minutes).  Arriving late affects you and other patients whose appointments are after yours.  Also, if you miss three or more appointments without notifying the office, you may be dismissed from the clinic at the providers discretion.      For prescription refill requests, have your pharmacy contact our office and allow 72 hours for refills to be completed.    Today you received the following chemotherapy and/or immunotherapy agents ABRAXENE and GEMZAr      To help prevent nausea and vomiting after your treatment, we encourage you to take your nausea medication as directed.  BELOW ARE SYMPTOMS THAT SHOULD BE REPORTED IMMEDIATELY: *FEVER GREATER THAN 100.4 F (38 C) OR HIGHER *CHILLS OR SWEATING *NAUSEA AND VOMITING THAT IS NOT CONTROLLED WITH YOUR NAUSEA MEDICATION *UNUSUAL SHORTNESS OF BREATH *UNUSUAL BRUISING OR BLEEDING *URINARY PROBLEMS (pain or burning when urinating, or frequent urination) *BOWEL PROBLEMS (unusual diarrhea, constipation, pain near the anus) TENDERNESS IN MOUTH AND THROAT WITH OR WITHOUT PRESENCE OF ULCERS (sore throat, sores in mouth, or a toothache) UNUSUAL RASH, SWELLING OR PAIN  UNUSUAL VAGINAL DISCHARGE OR ITCHING   Items with * indicate a potential emergency and should be followed up as soon as possible or go to the Emergency Department if any problems should occur.  Please show the CHEMOTHERAPY ALERT CARD or IMMUNOTHERAPY ALERT CARD at  check-in to the Emergency Department and triage nurse.  Should you have questions after your visit or need to cancel or reschedule your appointment, please contact [facility] [place] [facility] [place]  [phone] and follow the prompts.  Office hours are 8:00 a.m. to 4:30 p.m. Monday - Friday. Please note that voicemails left after 4:00 p.m. may not be returned until the following business day.  We are closed weekends and major holidays. You have access to a nurse at all times for urgent questions. Please call the main number to the clinic [phone] and follow the prompts.  For any non-urgent questions, you may also contact your provider using MyChart. We now offer e-Visits for anyone 65 and older to request care online for non-urgent symptoms. For details visit [email or web address].   Also download the MyChart app! Go to the app store, search "MyChart", open the app, select [facility], and log in with your MyChart username and password.  Due to Covid, a mask is required upon entering the hospital/clinic. If you do not have a mask, one will be given to you upon arrival. For doctor visits, patients may have 1 support person aged 65 or older with them. For treatment visits, patients cannot have anyone with them due to current Covid guidelines and our immunocompromised population.   Nanoparticle Albumin-Bound Paclitaxel injection What is this medication? NANOPARTICLE ALBUMIN-BOUND PACLITAXEL (Na no PAHR ti kuhl  al BYOO muhn-bound  PAK li TAX el) is a chemotherapy drug. It targets fast dividing cells, like cancer cells, and causes these cells to die. This medicine is used to treat advanced breast cancer, lung  cancer, and pancreatic cancer. This medicine may be used for other purposes; ask your health care provider or pharmacist if you have questions. COMMON BRAND NAME(S): Abraxane What should I tell my care team before I take this medication? They need to know if you have any of these  conditions: kidney disease liver disease low blood counts, like low white cell, platelet, or red cell counts lung or breathing disease, like asthma tingling of the fingers or toes, or other nerve disorder an unusual or allergic reaction to paclitaxel, albumin, other chemotherapy, other medicines, foods, dyes, or preservatives pregnant or trying to get pregnant breast-feeding How should I use this medication? This drug is given as an infusion into a vein. It is administered in a hospital or clinic by a specially trained health care professional. Talk to your pediatrician regarding the use of this medicine in children. Special care may be needed. Overdosage: If you think you have taken too much of this medicine contact a poison control center or emergency room at once. NOTE: This medicine is only for you. Do not share this medicine with others. What if I miss a dose? It is important not to miss your dose. Call your doctor or health care professional if you are unable to keep an appointment. What may interact with this medication? This medicine may interact with the following medications: antiviral medicines for hepatitis, HIV or AIDS certain antibiotics like erythromycin and clarithromycin certain medicines for fungal infections like ketoconazole and itraconazole certain medicines for seizures like carbamazepine, phenobarbital, phenytoin gemfibrozil nefazodone rifampin St. John's wort This list may not describe all possible interactions. Give your health care provider a list of all the medicines, herbs, non-prescription drugs, or dietary supplements you use. Also tell them if you smoke, drink alcohol, or use illegal drugs. Some items may interact with your medicine. What should I watch for while using this medication? Your condition will be monitored carefully while you are receiving this medicine. You will need important blood work done while you are taking this medicine. This medicine  can cause serious allergic reactions. If you experience allergic reactions like skin rash, itching or hives, swelling of the face, lips, or tongue, tell your doctor or health care professional right away. In some cases, you may be given additional medicines to help with side effects. Follow all directions for their use. This drug may make you feel generally unwell. This is not uncommon, as chemotherapy can affect healthy cells as well as cancer cells. Report any side effects. Continue your course of treatment even though you feel ill unless your doctor tells you to stop. Call your doctor or health care professional for advice if you get a fever, chills or sore throat, or other symptoms of a cold or flu. Do not treat yourself. This drug decreases your body's ability to fight infections. Try to avoid being around people who are sick. This medicine may increase your risk to bruise or bleed. Call your doctor or health care professional if you notice any unusual bleeding. Be careful brushing and flossing your teeth or using a toothpick because you may get an infection or bleed more easily. If you have any dental work done, tell your dentist you are receiving this medicine. Avoid taking products that contain aspirin, acetaminophen, ibuprofen, naproxen, or ketoprofen unless instructed by your doctor. These medicines may hide a fever. Do not become pregnant while taking this medicine or for 6 months after stopping it. Women should inform their doctor if they wish  to become pregnant or think they might be pregnant. Men should not father a child while taking this medicine or for 3 months after stopping it. There is a potential for serious side effects to an unborn child. Talk to your health care professional or pharmacist for more information. Do not breast-feed an infant while taking this medicine or for 2 weeks after stopping it. This medicine may interfere with the ability to get pregnant or to father a child. You  should talk to your doctor or health care professional if you are concerned about your fertility. What side effects may I notice from receiving this medication? Side effects that you should report to your doctor or health care professional as soon as possible: allergic reactions like skin rash, itching or hives, swelling of the face, lips, or tongue breathing problems changes in vision fast, irregular heartbeat low blood pressure mouth sores pain, tingling, numbness in the hands or feet signs of decreased platelets or bleeding - bruising, pinpoint red spots on the skin, black, tarry stools, blood in the urine signs of decreased red blood cells - unusually weak or tired, feeling faint or lightheaded, falls signs of infection - fever or chills, cough, sore throat, pain or difficulty passing urine signs and symptoms of liver injury like dark yellow or brown urine; general ill feeling or flu-like symptoms; light-colored stools; loss of appetite; nausea; right upper belly pain; unusually weak or tired; yellowing of the eyes or skin swelling of the ankles, feet, hands unusually slow heartbeat Side effects that usually do not require medical attention (report to your doctor or health care professional if they continue or are bothersome): diarrhea hair loss loss of appetite nausea, vomiting tiredness This list may not describe all possible side effects. Call your doctor for medical advice about side effects. You may report side effects to FDA at 1-800-FDA-1088. Where should I keep my medication? This drug is given in a hospital or clinic and will not be stored at home. NOTE: This sheet is a summary. It may not cover all possible information. If you have questions about this medicine, talk to your doctor, pharmacist, or health care provider.  2022 [employer]/Gold Standard (2017-02-15 00:00:00)  Gemcitabine injection What is this medication? GEMCITABINE (jem SYE ta been) is a chemotherapy drug.  This medicine is used to treat many types of cancer like breast cancer, lung cancer, pancreatic cancer, and ovarian cancer. This medicine may be used for other purposes; ask your health care provider or pharmacist if you have questions. COMMON BRAND NAME(S): Gemzar, Infugem What should I tell my care team before I take this medication? They need to know if you have any of these conditions: blood disorders infection kidney disease liver disease lung or breathing disease, like asthma recent or ongoing radiation therapy an unusual or allergic reaction to gemcitabine, other chemotherapy, other medicines, foods, dyes, or preservatives pregnant or trying to get pregnant breast-feeding How should I use this medication? This drug is given as an infusion into a vein. It is administered in a hospital or clinic by a specially trained health care professional. Talk to your pediatrician regarding the use of this medicine in children. Special care may be needed. Overdosage: If you think you have taken too much of this medicine contact a poison control center or emergency room at once. NOTE: This medicine is only for you. Do not share this medicine with others. What if I miss a dose? It is important not to miss your dose. Call your  doctor or health care professional if you are unable to keep an appointment. What may interact with this medication? medicines to increase blood counts like filgrastim, pegfilgrastim, sargramostim some other chemotherapy drugs like cisplatin vaccines Talk to your doctor or health care professional before taking any of these medicines: acetaminophen aspirin ibuprofen ketoprofen naproxen This list may not describe all possible interactions. Give your health care provider a list of all the medicines, herbs, non-prescription drugs, or dietary supplements you use. Also tell them if you smoke, drink alcohol, or use illegal drugs. Some items may interact with your medicine. What  should I watch for while using this medication? Visit your doctor for checks on your progress. This drug may make you feel generally unwell. This is not uncommon, as chemotherapy can affect healthy cells as well as cancer cells. Report any side effects. Continue your course of treatment even though you feel ill unless your doctor tells you to stop. In some cases, you may be given additional medicines to help with side effects. Follow all directions for their use. Call your doctor or health care professional for advice if you get a fever, chills or sore throat, or other symptoms of a cold or flu. Do not treat yourself. This drug decreases your body's ability to fight infections. Try to avoid being around people who are sick. This medicine may increase your risk to bruise or bleed. Call your doctor or health care professional if you notice any unusual bleeding. Be careful brushing and flossing your teeth or using a toothpick because you may get an infection or bleed more easily. If you have any dental work done, tell your dentist you are receiving this medicine. Avoid taking products that contain aspirin, acetaminophen, ibuprofen, naproxen, or ketoprofen unless instructed by your doctor. These medicines may hide a fever. Do not become pregnant while taking this medicine or for 6 months after stopping it. Women should inform their doctor if they wish to become pregnant or think they might be pregnant. Men should not father a child while taking this medicine and for 3 months after stopping it. There is a potential for serious side effects to an unborn child. Talk to your health care professional or pharmacist for more information. Do not breast-feed an infant while taking this medicine or for at least 1 week after stopping it. Men should inform their doctors if they wish to father a child. This medicine may lower sperm counts. Talk with your doctor or health care professional if you are concerned about your  fertility. What side effects may I notice from receiving this medication? Side effects that you should report to your doctor or health care professional as soon as possible: allergic reactions like skin rash, itching or hives, swelling of the face, lips, or tongue breathing problems pain, redness, or irritation at site where injected signs and symptoms of a dangerous change in heartbeat or heart rhythm like chest pain; dizziness; fast or irregular heartbeat; palpitations; feeling faint or lightheaded, falls; breathing problems signs of decreased platelets or bleeding - bruising, pinpoint red spots on the skin, black, tarry stools, blood in the urine signs of decreased red blood cells - unusually weak or tired, feeling faint or lightheaded, falls signs of infection - fever or chills, cough, sore throat, pain or difficulty passing urine signs and symptoms of kidney injury like trouble passing urine or change in the amount of urine signs and symptoms of liver injury like dark yellow or brown urine; general ill feeling or  flu-like symptoms; light-colored stools; loss of appetite; nausea; right upper belly pain; unusually weak or tired; yellowing of the eyes or skin swelling of ankles, feet, hands Side effects that usually do not require medical attention (report to your doctor or health care professional if they continue or are bothersome): constipation diarrhea hair loss loss of appetite nausea rash vomiting This list may not describe all possible side effects. Call your doctor for medical advice about side effects. You may report side effects to FDA at 1-800-FDA-1088. Where should I keep my medication? This drug is given in a hospital or clinic and will not be stored at home. NOTE: This sheet is a summary. It may not cover all possible information. If you have questions about this medicine, talk to your doctor, pharmacist, or health care provider.  2022 [employer]/Gold Standard (2017-09-07  00:00:00)

## 2021-06-26 NOTE — Progress Notes (Addendum)
Hematology/Oncology follow up note [phone] [phone]   Patient Care Team: [name], MD as PCP - General (Family Medicine) Flinchum, Kelby Aline, FNP (Family Medicine) [name], RN as Oncology Nurse Navigator [name], MD as Consulting Physician (Oncology)  REFERRING PROVIDER: [name] [name], MD  CHIEF COMPLAINTS/REASON FOR VISIT:  Follow up for treatment of pancreatic adenocarcinoma  HISTORY OF PRESENTING ILLNESS:   Cassandra [name] is a  65 y.o.  female with PMH listed below was seen in consultation at the request of  [name], MD  for evaluation of pancreatic adenocarcinoma Patient initially presented with jaundice, transaminitis, bilirubin was 9.9.  CA 19-9 was 1874.  Patient also reports unintentional weight loss. 02/08/2020 MRI abdomen and MRCP with and without contrast was done at [facility] which showed pancreatic head mass measuring up to 3 cm, with marked associated narrowing of the portal vein confluence.  SMA is preserved.  Marked intrahepatic and extrahepatic biliary duct dilatation as well as mild dilatation of the main pancreatic duct. Multiple hepatic masses highly concerning for metastatic disease.  Patient underwent EUS on 02/19/2020, which showed irregular mass identified in the pancreatic head, hypoechoic, measured 55mx33mm, sonographic evidence concerning for invasion into the superior mesenteric artery.  There is no sign of significant abnormality in the main pancreatic duct.  Dilatation of common bile duct which measured up to 16 mm.  Region of celiac artery was visualized and showed no signs of significant abnormality.  No lymphadenopathy.  FNA showed adenocarcinoma.  02/19/2020, ERCP, malignant.  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  The stricture was treated with placement of wall flex metal stent.  [facility] ZPia [facility]auand  was recommended for 3 drug regimen FOLFIRINOX.  [facility]  Patient was referred to establish care today. She denies any pain.  Since stent placement, skin jaundice has improved.  Itchiness has also improved. Patient was accompanied by her husband today.  She has a family history of breast cancer in sister and paternal aunt, colon cancer paternal grandmother.  #No reportable targetable mutation on NGS 9/14/2021cycle 1 FOLFIRINOX.  Patient received oxaliplatin and about 50% of Irinotecan on day 1 and had experienced neurologic symptoms.  She went to ER and working diagnosis is TIA, and eventually I think this is due to irinotecan side effects -irinotecan-associated dysarthria, lip/tongue numbness.  Adjustment was made for Irinotecan to be  infused over 180 minutes.  Atropine 0.5 mg once prior to the irinotecan. No recurrent symptoms.   # 03/19/2020-03/21/2020 patient was admitted due to sepsis with strep pneumonia bacteremia.  Patient was treated with IV Rocephin.  TEE was done which showed no vegetation.  No PFO or ASD.  Patient was discharged home and he finished full course of 14 days of IV Rocephin on 04/02/2020 per ID recommendation.  Repeat blood culture was also negative.  08/25/20 cycle 10 FOLFIRINOX 09/08/20- present  Starting cycle 11, FOLFIRI, Oxaliplatin discontinued due to neuropathy   #NGS showed no reportable targetable mutation #Genetic testing-[employer] diagnostic testing showed no pathological variants identified. MS stable, TMB 016m/mb, KRAS G12D, SF3B1 K700E, TP53 [id number]  #01/14/2021  [facility].  # [facility] [Th[facility] Scrapere 4~5 liver lesions on OSH MRI last year at the time of diagnosis highly suspicious for metastasis. She  not eligible for surgical protocol for metastasis resection given the number of liver metastasis is >3. However given her excellent  and durable response to chemo, surgical resection may be considered pending sustained disease control at 1 year and may require partial hepatectomy first to see if there is viable tumor before proceeding to Whipple resection ]  Patient had a COVID-19 infection in September 2022.  03/23/2021 CT abdomen pelvis No significant change in the ill-defined pancreatic head mass or  associated biliary ductal and pancreatic ductal dilation.  Slight interval enlargement of subcentimeter anterior peripancreatic  lymph node, nonspecific. Attention on follow-up per clinical protocol. Similar enlargement of the ascending thoracic aorta measuring 4.4 cm  03/23/2021 MRI abdomen w and wo  Increasing ill-defined hypoenhancement of the pancreatic head surrounding the common bile duct stent, in keeping with known pancreatic malignancy. Anterior peripancreatic lymph node is better evaluated on CT.  Unchanged position of a common bile duct stent with similar mild diffuse intrahepatic ductal dilation and pneumobilia. No evidence of metastatic disease in the abdomen or pelvis. Partially visualized cystic left adnexal lesion measuring up to 3.9 cm.   04/10/2021, patient underwent liver resection at [facility], by Dr. [name].   Pathology report from [place] was reviewed. Segments 3 partial hepatectomy, negative for viable tumor. Section 5/6 partial hepatectomy part 1 and 2, microscopic foci of residual viable adenocarcinoma, morphologically consistent with history of pancreatic primary.  Parenchymal margin is uninvolved.  #Patient resumed on FOLFIRI on 04/28/2021. She got another cycle on 05/12/2021 #05/26/2021, patient did not get additional chemotherapy due to transaminitis.  Shared decision was made to stop FOLFIRI and switch to gemcitabine Abraxane treatments.  05/26/2021, patient developed transaminitis, AST 763, ALT 691, alkaline phosphatase 364.  Bilirubin 0.7 05/26/2021 stat ultrasound abdomen right upper quadrant showed interval  development of 3.3 x 1.8 cm complex mass in the right hepatic lobe.  Increased extrahepatic ductal dilatation.  Concerning for CBD obstruction. 05/29/2021, MRI abdomen MRCP with and without contrast showed unchanged pancreatic head soft tissue.  Severe intra and extrahepatic biliary ductal dilatation.  Common bile duct stents remain in position however patency is not established.  No evidence of lymphadenopathy or metastatic disease in the abdomen.  With further communication with radiologist, addendum was added that there is internal fluid signal and no appreciable associated contrast enhancement measuring 3.4 x 2.3 cm.  This appearance is generally not consistent with metastasis and is of uncertain significance.  Possibly reflecting hepatic abscess or residual of subcapsular hematoma.  Patient was seen by [facility] Dr. Alwyn Pea team.  He had ERCP done on 06/05/2021, with findings of One stent from the biliary tree was seen in the major papilla. The stent had migrated significantly into the duodenum. This is the cause of stent malfunction. One stent was removed from the biliary tree. Prior biliary sphincterotomy appeared open. - A single severe biliary stricture was found in the lower third of the main bile duct with upstream dilation. The stricture was alignant appearing.- The biliary tree was swept and sludge was found.- One fully covered metal stent was placed into the common bile duct across the stricture.- No pancreatogram performed.  06/12/2021 Gemcitabine and Abraxane.   INTERVAL HISTORY Cassandra [name] is a 65 y.o. female who has above history reviewed by me today presents for pancreatic cancer.  She tolerates cycle 1 Gemcitabine and Abraxane. Nausea is mild. No vomiting.  Neuropathy symptoms are slight worse.   Review of Systems  Constitutional:  Negative for appetite change, chills, fatigue,  fever and unexpected weight change.  HENT:   Negative for hearing loss and voice change.   Eyes:  Negative for  eye problems.  Respiratory:  Negative for chest tightness and cough.   Cardiovascular:  Negative for chest pain.  Gastrointestinal:  Positive for nausea. Negative for abdominal distention, abdominal pain and blood in stool.  Endocrine: Negative for hot flashes.  Genitourinary:  Negative for difficulty urinating and frequency.   Musculoskeletal:  Negative for arthralgias.  Skin:  Negative for itching and rash.  Neurological:  Positive for numbness. Negative for extremity weakness.  Hematological:  Negative for adenopathy.  Psychiatric/Behavioral:  Negative for confusion.    MEDICAL HISTORY:  Past Medical History:  Diagnosis Date   Cancer [facility])    pancreatic cancer   Colon polyps    Family history of breast cancer    Neutropenia (HCC) 04/18/2020   Osteopenia after menopause 05/2017   femoral neck T score -2.0   Personal history of chemotherapy    current for pancreatic ca    SURGICAL HISTORY: Past Surgical History:  Procedure Laterality Date   COLONOSCOPY  07/2015   WNL   COLONOSCOPY  2008/2011   IR CV LINE INJECTION  04/28/2021   OVARIAN CYST REMOVAL  1992   dermoid-Dr CAK   PORTA CATH INSERTION N/A 03/07/2020   Procedure: PORTA CATH INSERTION;  Surgeon: Annice Needy, MD;  Location: [facility] INVASIVE CV LAB;  Service: Cardiovascular;  Laterality: N/A;   TEE WITHOUT CARDIOVERSION N/A 03/21/2020   Procedure: TRANSESOPHAGEAL ECHOCARDIOGRAM (TEE);  Surgeon: Laurier Nancy, MD;  Location: [facility] ORS;  Service: Cardiovascular;  Laterality: N/A;   TUBAL LIGATION  1993    SOCIAL HISTORY: Social History   Socioeconomic History   Marital status: Married    Spouse name: Not on file   Number of children: 2   Years of education: Not on file   Highest education level: Not on file  Occupational History   Occupation: Runner, broadcasting/film/video    Comment: retired   Occupation: Visual merchandiser  Tobacco Use   Smoking status: Former    Packs/day: 0.50    Years: 10.00    Pack years: 5.00    Types: Cigarettes     Quit date: 1987    Years since quitting: 36.0   Smokeless tobacco: Never   Tobacco comments:    Quit smoking 1987  Vaping Use   Vaping Use: Never used  Substance and Sexual Activity   Alcohol use: Not Currently    Comment: 0-2 mixed drinks a day   Drug use: No   Sexual activity: Not Currently    Birth control/protection: Post-menopausal  Other Topics Concern   Not on file  Social History Narrative   Not on file   Social Determinants of Health   Financial Resource Strain: Not on file  Food Insecurity: Not on file  Transportation Needs: Not on file  Physical Activity: Not on file  Stress: Not on file  Social Connections: Not on file  Intimate Partner Violence: Not on file    FAMILY HISTORY: Family History  Problem Relation Age of Onset   Breast cancer Paternal Aunt 29   Diabetes Mother    Osteoporosis Mother    Hyperlipidemia Father    Rheumatic fever Father    Valvular heart disease Father    Cancer Paternal Grandmother        possible colon   Breast cancer Sister 47    ALLERGIES:  is allergic to penicillin g.  MEDICATIONS:  Current Outpatient Medications  Medication Sig Dispense Refill   Calcium Carbonate-Vit D-Min (CALCIUM 1200 PO) Take by mouth.     Cholecalciferol 25 MCG (1000 UT) tablet Take 2,000 Units by mouth daily.     Cyanocobalamin (VITAMIN B 12 PO) Take by mouth.     gabapentin (NEURONTIN) 100 MG capsule Take 1 capsule (100 mg total) by mouth at bedtime. 30 capsule 1   Multiple Vitamins-Minerals (MULTIVITAMIN WITH MINERALS) tablet Take 1 tablet by mouth daily.     Pancrelipase, Lip-Prot-Amyl, (CREON) 24000-76000 units CPEP Take 1 capsule (24,000 Units total) by mouth 3 (three) times daily before meals. 90 capsule 3   simethicone (GAS-X) 80 MG chewable tablet Chew 1 tablet (80 mg total) by mouth every 8 (eight) hours as needed for flatulence. 60 tablet 0   magic mouthwash w/lidocaine SOLN Take 5 mLs by mouth 4 (four) times daily as needed for mouth  pain. Sig: Swish & Spit 5-10 ml four times a day as needed. Dispense 480 ml. 1RF (Patient not taking: Reported on 12/17/2020) 480 mL 1   potassium chloride SA (KLOR-CON M20) 20 MEQ tablet Take 1 tablet (20 mEq total) by mouth daily. (Patient not taking: Reported on 03/25/2021) 30 tablet 0   No current facility-administered medications for this visit.   Facility-Administered Medications Ordered in Other Visits  Medication Dose Route Frequency Provider Last Rate Last Admin   sodium chloride flush (NS) 0.9 % injection 10 mL  10 mL Intravenous PRN [name], MD   10 mL at 02/18/21 0858   sodium chloride flush (NS) 0.9 % injection 10 mL  10 mL Intravenous Once Borders, Kirt Boys, NP       sodium chloride flush (NS) 0.9 % injection 10 mL  10 mL Intravenous Once [name], MD         PHYSICAL EXAMINATION: ECOG PERFORMANCE STATUS: 0 - Asymptomatic Vitals:   06/26/21 0857  BP: 123/77  Pulse: 60  Temp: 98.3 F (36.8 C)   Filed Weights   06/26/21 0857  Weight: 137 lb 3.2 oz (62.2 kg)    Physical Exam Constitutional:      General: She is not in acute distress. HENT:     Head: Normocephalic and atraumatic.  Eyes:     General: No scleral icterus. Cardiovascular:     Rate and Rhythm: Normal rate and regular rhythm.     Heart sounds: Normal heart sounds.  Pulmonary:     Effort: Pulmonary effort is normal. No respiratory distress.     Breath sounds: No wheezing.  Abdominal:     General: Bowel sounds are normal. There is no distension.     Palpations: Abdomen is soft.  Musculoskeletal:        General: No deformity. Normal range of motion.     Cervical back: Normal range of motion and neck supple.  Skin:    General: Skin is warm and dry.     Coloration: Skin is not jaundiced.     Findings: No erythema or rash.  Neurological:     Mental Status: She is alert and oriented to person, place, and time. Mental status is at baseline.     Cranial Nerves: No cranial nerve deficit.      Coordination: Coordination normal.  Psychiatric:        Mood and Affect: Mood normal.    LABORATORY DATA:  I have reviewed the data as listed Lab Results  Component Value Date   WBC 4.3 06/26/2021   HGB  11.9 (L) 06/26/2021   HCT 35.0 (L) 06/26/2021   MCV 95.9 06/26/2021   PLT 174 06/26/2021   Recent Labs    05/26/21 0803 06/12/21 0816 06/26/21 0845  NA 138 140 138  K 3.9 4.1 4.3  CL 104 106 104  CO2 $Re'24 25 26  'Edt$ GLUCOSE 160* 94 99  BUN $Re'17 19 19  'OMC$ CREATININE 0.68 0.64 0.71  CALCIUM 8.8* 9.0 8.8*  GFRNONAA >60 >60 >60  PROT 6.4* 6.7 6.4*  ALBUMIN 3.9 3.8 3.7  AST 763* 40 26  ALT 691* 84* 27  ALKPHOS 364* 295* 148*  BILITOT 0.7 0.4 0.3    Iron/TIBC/Ferritin/ %Sat No results found for: IRON, TIBC, FERRITIN, IRONPCTSAT    RADIOGRAPHIC STUDIES: I have personally reviewed the radiological images as listed and agreed with the findings in the report. Reviewed findings of MRI abdomen MRCP done at [facility]. MR 3D Recon At Scanner  Addendum Date: 05/29/2021   ADDENDUM REPORT: 05/29/2021 08:43 ADDENDUM: There is a subcapsular, nonenhancing lesion of the inferior tip of the right lobe of the liver, hepatic segment VII, adjacent to the gallbladder fossa, with internal fluid signal and no appreciable associated contrast enhancement measuring 3.4 x 2.3 cm. This appearance is generally not consistent with metastasis and is of uncertain significance, possibly reflecting a hepatic abscess in the setting of biliary obstruction or residua of a subcapsular hematoma. Findings discussed with Dr. Tasia Catchings on 05/27/2021 and 05/29/2021. Electronically Signed   By: Delanna Ahmadi M.D.   On: 05/29/2021 08:43   Result Date: 05/29/2021 CLINICAL DATA:  Pancreatic cancer, elevated LFTs EXAM: MRI ABDOMEN WITHOUT AND WITH CONTRAST (INCLUDING MRCP) TECHNIQUE: Multiplanar multisequence MR imaging of the abdomen was performed both before and after the administration of intravenous contrast. Heavily T2-weighted images of  the biliary and pancreatic ducts were obtained, and three-dimensional MRCP images were rendered by post processing. CONTRAST:  75mL GADAVIST GADOBUTROL 1 MMOL/ML IV SOLN COMPARISON:  CT chest abdomen pelvis, 10/31/2020 FINDINGS: Lower chest: No acute findings. Hepatobiliary: Severe intra and extrahepatic biliary ductal dilatation, increased compared to prior CT. The common bile duct measures 1.8 cm, previously 1.1 cm. Common bile duct stent remains in position. Portal veins are patent. Pancreas: Unchanged post treatment appearance of pancreatic head soft tissue, although ill-defined measuring approximately 2.9 x 2.6 cm (series 14, image 40). Severe atrophy of the pancreatic parenchyma distally with prominence of the pancreatic duct. Spleen:  Within normal limits in size and appearance. Adrenals/Urinary Tract: No masses identified. No evidence of hydronephrosis. Stomach/Bowel: Visualized portions within the abdomen are unremarkable. Vascular/Lymphatic: No pathologically enlarged lymph nodes identified. No abdominal aortic aneurysm demonstrated. Other:  None. Musculoskeletal: No suspicious bone lesions identified. IMPRESSION: 1. Unchanged post treatment appearance of pancreatic head soft tissue, in keeping with primary pancreatic malignancy. 2. Severe intra and extrahepatic biliary ductal dilatation, increased compared to prior CT. Common bile duct stent remains in position, however patency is not established on this MR examination. 3. No evidence of lymphadenopathy or metastatic disease in the abdomen. Electronically Signed: By: Delanna Ahmadi M.D. On: 05/27/2021 13:31   IR CV Line Injection  Result Date: 04/28/2021 CLINICAL DATA:  no blood return EXAM: PORT  CATHETER INJECTION UNDER FLUOROSCOPY TECHNIQUE: The procedure, risks (including but not limited to bleeding, infection, organ damage ), benefits, and alternatives were explained to the patient. Questions regarding the procedure were encouraged and answered.  The patient understands and consents to the procedure. Survey fluoroscopic inspection reveals good position of right IJ power injectable port  catheter, tip in the proximal right atrium. No catheter discontinuity or fracture. Injection demonstrates patency of the system. No extravasation. No evidence of fibrin sheath. Contrast flows freely into the right atrium. IMPRESSION: 1. Normal appearance and function of right IJ power injectable port catheter. Okay for routine use. Consider Cathflo dwell per protocol if aspiration issues persist. Electronically Signed   By: Lucrezia Europe M.D.   On: 04/28/2021 11:20   MR ABDOMEN MRCP W WO CONTAST  Addendum Date: 05/29/2021   ADDENDUM REPORT: 05/29/2021 08:43 ADDENDUM: There is a subcapsular, nonenhancing lesion of the inferior tip of the right lobe of the liver, hepatic segment VII, adjacent to the gallbladder fossa, with internal fluid signal and no appreciable associated contrast enhancement measuring 3.4 x 2.3 cm. This appearance is generally not consistent with metastasis and is of uncertain significance, possibly reflecting a hepatic abscess in the setting of biliary obstruction or residua of a subcapsular hematoma. Findings discussed with Dr. Tasia Catchings on 05/27/2021 and 05/29/2021. Electronically Signed   By: Delanna Ahmadi M.D.   On: 05/29/2021 08:43   Result Date: 05/29/2021 CLINICAL DATA:  Pancreatic cancer, elevated LFTs EXAM: MRI ABDOMEN WITHOUT AND WITH CONTRAST (INCLUDING MRCP) TECHNIQUE: Multiplanar multisequence MR imaging of the abdomen was performed both before and after the administration of intravenous contrast. Heavily T2-weighted images of the biliary and pancreatic ducts were obtained, and three-dimensional MRCP images were rendered by post processing. CONTRAST:  31mL GADAVIST GADOBUTROL 1 MMOL/ML IV SOLN COMPARISON:  CT chest abdomen pelvis, 10/31/2020 FINDINGS: Lower chest: No acute findings. Hepatobiliary: Severe intra and extrahepatic biliary ductal  dilatation, increased compared to prior CT. The common bile duct measures 1.8 cm, previously 1.1 cm. Common bile duct stent remains in position. Portal veins are patent. Pancreas: Unchanged post treatment appearance of pancreatic head soft tissue, although ill-defined measuring approximately 2.9 x 2.6 cm (series 14, image 40). Severe atrophy of the pancreatic parenchyma distally with prominence of the pancreatic duct. Spleen:  Within normal limits in size and appearance. Adrenals/Urinary Tract: No masses identified. No evidence of hydronephrosis. Stomach/Bowel: Visualized portions within the abdomen are unremarkable. Vascular/Lymphatic: No pathologically enlarged lymph nodes identified. No abdominal aortic aneurysm demonstrated. Other:  None. Musculoskeletal: No suspicious bone lesions identified. IMPRESSION: 1. Unchanged post treatment appearance of pancreatic head soft tissue, in keeping with primary pancreatic malignancy. 2. Severe intra and extrahepatic biliary ductal dilatation, increased compared to prior CT. Common bile duct stent remains in position, however patency is not established on this MR examination. 3. No evidence of lymphadenopathy or metastatic disease in the abdomen. Electronically Signed: By: Delanna Ahmadi M.D. On: 05/27/2021 13:31   US Abdomen Limited RUQ (LIVER/GB)  Result Date: 05/26/2021 CLINICAL DATA:  Abnormal liver enzymes. EXAM: ULTRASOUND ABDOMEN LIMITED RIGHT UPPER QUADRANT COMPARISON:  March 19, 2021.  Oct 30, 2020. FINDINGS: Gallbladder: No gallstones or wall thickening visualized. No sonographic Murphy sign noted by sonographer. Common bile duct: Diameter: 16 mm which is increased compared to prior exam. Liver: 3.3 x 1.8 cm complex mass is seen in right hepatic lobe peripherally. Mild intrahepatic biliary dilatation is noted. Within normal limits in parenchymal echogenicity. Portal vein is patent on color Doppler imaging with normal direction of blood flow towards the liver.  Other: None. IMPRESSION: Interval development of 3.3 x 1.8 cm complex mass in right hepatic lobe. CT or MRI with intravenous contrast is recommended to evaluate for metastatic disease or other neoplasm. Increased extrahepatic ductal dilatation is noted concerning for distal common bile duct obstruction.  Correlation with liver function tests is recommended. Electronically Signed   By: Marijo Conception M.D.   On: 05/26/2021 14:18      ASSESSMENT & PLAN:  1. Primary pancreatic cancer ([place])   2. 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 Pancreatic insufficiency   5. 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  Pathology proved residual disease  Labs are reviewed and discussed with patient. Proceed with Cycle 1 D8 Gemcitabine and Abraxane- dose reduced -$RemoveBefor'100mg'[id number]$ /m2 due to neuropathy..  . #Chemotherapy-induced neuropathy of fingertips and toes, grade 2.  Follow-up with neurology.  Not tolerating Cymbalta.  Dose reduced Abraxane  #pancreatic insufficiency.  Continue Creon,continue  24000 units TID with meals.   #Elevated CEA,-  Patient is due for colonoscopy and has appt with gastroenterology for colonoscopy. # Chemotherapy induced anemia  All questions were answered. The patient knows to call the clinic with any problems questions or concerns.  Return of visit: 1 week for cycle 1 D15 chemo.  [name], MD, PhD  06/26/2021

## 2021-06-27 LAB — CANCER ANTIGEN 19-9: CA 19-9: 7 U/mL (ref 0–35)

## 2021-07-01 ENCOUNTER — Other Ambulatory Visit: Payer: Self-pay | Admitting: Oncology

## 2021-07-03 ENCOUNTER — Other Ambulatory Visit: Payer: Self-pay

## 2021-07-03 ENCOUNTER — Inpatient Hospital Stay (HOSPITAL_BASED_OUTPATIENT_CLINIC_OR_DEPARTMENT_OTHER): Payer: Medicare Other | Admitting: Oncology

## 2021-07-03 ENCOUNTER — Encounter: Payer: Self-pay | Admitting: Oncology

## 2021-07-03 ENCOUNTER — Inpatient Hospital Stay: Payer: Medicare Other

## 2021-07-03 ENCOUNTER — Inpatient Hospital Stay: Payer: Medicare Other | Attending: Oncology

## 2021-07-03 VITALS — BP 111/80 | HR 60 | Temp 97.7°F | Resp 18 | Wt 138.1 lb

## 2021-07-03 DIAGNOSIS — C259 Malignant neoplasm of pancreas, unspecified: Secondary | ICD-10-CM

## 2021-07-03 DIAGNOSIS — C787 Secondary malignant neoplasm of liver and intrahepatic bile duct: Secondary | ICD-10-CM | POA: Insufficient documentation

## 2021-07-03 DIAGNOSIS — K8689 Other specified diseases of pancreas: Secondary | ICD-10-CM

## 2021-07-03 DIAGNOSIS — Z5111 Encounter for antineoplastic chemotherapy: Secondary | ICD-10-CM | POA: Diagnosis not present

## 2021-07-03 DIAGNOSIS — G620 Drug-induced polyneuropathy: Secondary | ICD-10-CM | POA: Diagnosis not present

## 2021-07-03 DIAGNOSIS — D701 Agranulocytosis secondary to cancer chemotherapy: Secondary | ICD-10-CM | POA: Diagnosis not present

## 2021-07-03 DIAGNOSIS — T451X5A Adverse effect of antineoplastic and immunosuppressive drugs, initial encounter: Secondary | ICD-10-CM

## 2021-07-03 LAB — COMPREHENSIVE METABOLIC PANEL
ALT: 49 U/L — ABNORMAL HIGH (ref 0–44)
AST: 32 U/L (ref 15–41)
Albumin: 3.7 g/dL (ref 3.5–5.0)
Alkaline Phosphatase: 187 U/L — ABNORMAL HIGH (ref 38–126)
Anion gap: 7 (ref 5–15)
BUN: 17 mg/dL (ref 8–23)
CO2: 28 mmol/L (ref 22–32)
Calcium: 8.8 mg/dL — ABNORMAL LOW (ref 8.9–10.3)
Chloride: 102 mmol/L (ref 98–111)
Creatinine, Ser: 0.68 mg/dL (ref 0.44–1.00)
GFR, Estimated: >60 mL/min (ref >60)
Glucose, Bld: 104 mg/dL — ABNORMAL HIGH (ref 70–99)
Potassium: 4.5 mmol/L (ref 3.5–5.1)
Sodium: 137 mmol/L (ref 135–145)
Total Bilirubin: 0.4 mg/dL (ref 0.3–1.2)
Total Protein: 6.4 g/dL — ABNORMAL LOW (ref 6.5–8.1)

## 2021-07-03 LAB — CBC WITH DIFFERENTIAL/PLATELET
Abs Immature Granulocytes: 0.02 10*3/uL (ref 0.00–0.07)
Basophils Absolute: 0 10*3/uL (ref 0.0–0.1)
Basophils Relative: 1 %
Eosinophils Absolute: 0 10*3/uL (ref 0.0–0.5)
Eosinophils Relative: 1 %
HCT: 31.9 % — ABNORMAL LOW (ref 36.0–46.0)
Hemoglobin: 10.8 g/dL — ABNORMAL LOW (ref 12.0–15.0)
Immature Granulocytes: 1 %
Lymphocytes Relative: 45 %
Lymphs Abs: 1.4 10*3/uL (ref 0.7–4.0)
MCH: 32 pg (ref 26.0–34.0)
MCHC: 33.9 g/dL (ref 30.0–36.0)
MCV: 94.7 fL (ref 80.0–100.0)
Monocytes Absolute: 0.3 10*3/uL (ref 0.1–1.0)
Monocytes Relative: 9 %
Neutro Abs: 1.3 10*3/uL — ABNORMAL LOW (ref 1.7–7.7)
Neutrophils Relative %: 43 %
Platelets: 147 10*3/uL — ABNORMAL LOW (ref 150–400)
RBC: 3.37 MIL/uL — ABNORMAL LOW (ref 3.87–5.11)
RDW: 12.6 % (ref 11.5–15.5)
WBC: 3.1 10*3/uL — ABNORMAL LOW (ref 4.0–10.5)
nRBC: 0 % (ref 0.0–0.2)

## 2021-07-03 MED ORDER — PACLITAXEL PROTEIN-BOUND CHEMO INJECTION 100 MG
100.0000 mg/m2 | Freq: Once | INTRAVENOUS | Status: AC
  Administered 2021-07-03: 175 mg via INTRAVENOUS
  Filled 2021-07-03: qty 35

## 2021-07-03 MED ORDER — HEPARIN SOD (PORK) LOCK FLUSH 100 UNIT/ML IV SOLN
500.0000 [IU] | Freq: Once | INTRAVENOUS | Status: AC | PRN
  Administered 2021-07-03: 500 [IU]
  Filled 2021-07-03: qty 5

## 2021-07-03 MED ORDER — SODIUM CHLORIDE 0.9 % IV SOLN
Freq: Once | INTRAVENOUS | Status: AC
  Filled 2021-07-03: qty 250

## 2021-07-03 MED ORDER — PROCHLORPERAZINE MALEATE 10 MG PO TABS
10.0000 mg | ORAL_TABLET | Freq: Once | ORAL | Status: AC
  Administered 2021-07-03: 10 mg via ORAL
  Filled 2021-07-03: qty 1

## 2021-07-03 MED ORDER — SODIUM CHLORIDE 0.9 % IV SOLN
1600.0000 mg | Freq: Once | INTRAVENOUS | Status: AC
  Administered 2021-07-03: 1600 mg via INTRAVENOUS
  Filled 2021-07-03: qty 26.3

## 2021-07-03 NOTE — Progress Notes (Signed)
Hematology/Oncology follow up note [phone] [phone]   Patient Care Team: [name], MD as PCP - General (Family Medicine) Flinchum, Kelby Aline, FNP (Family Medicine) [name], RN as Oncology Nurse Navigator [name], MD as Consulting Physician (Oncology)  REFERRING PROVIDER: [name] [name], MD  CHIEF COMPLAINTS/REASON FOR VISIT:  Follow up for treatment of pancreatic adenocarcinoma  HISTORY OF PRESENTING ILLNESS:   Cassandra [name] is a  66 y.o.  female with PMH listed below was seen in consultation at the request of  [name], MD  for evaluation of pancreatic adenocarcinoma Patient initially presented with jaundice, transaminitis, bilirubin was 9.9.  CA 19-9 was 1874.  Patient also reports unintentional weight loss. 02/08/2020 MRI abdomen and MRCP with and without contrast was done at [facility] which showed pancreatic head mass measuring up to 3 cm, with marked associated narrowing of the portal vein confluence.  SMA is preserved.  Marked intrahepatic and extrahepatic biliary duct dilatation as well as mild dilatation of the main pancreatic duct. Multiple hepatic masses highly concerning for metastatic disease.  Patient underwent EUS on 02/19/2020, which showed irregular mass identified in the pancreatic head, hypoechoic, measured 55mx33mm, sonographic evidence concerning for invasion into the superior mesenteric artery.  There is no sign of significant abnormality in the main pancreatic duct.  Dilatation of common bile duct which measured up to 16 mm.  Region of celiac artery was visualized and showed no signs of significant abnormality.  No lymphadenopathy.  FNA showed adenocarcinoma.  02/19/2020, ERCP, malignant.  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  The stricture was treated with placement of wall flex metal stent.  [facility] ZPia Mauand  was recommended for 3 drug regimen FOLFIRINOX.  [facility]  Patient was referred to establish care today. She denies any pain.  Since stent placement, skin jaundice has improved.  Itchiness has also improved. Patient was accompanied by her husband today.  She has a family history of breast cancer in sister and paternal aunt, colon cancer paternal grandmother.  #No reportable targetable mutation on NGS 9/14/2021cycle 1 FOLFIRINOX.  Patient received oxaliplatin and about 50% of Irinotecan on day 1 and had experienced neurologic symptoms.  She went to ER and working diagnosis is TIA, and eventually I think this is due to irinotecan side effects -irinotecan-associated dysarthria, lip/tongue numbness.  Adjustment was made for Irinotecan to be  infused over 180 minutes.  Atropine 0.5 mg once prior to the irinotecan. No recurrent symptoms.   # 03/19/2020-03/21/2020 patient was admitted due to sepsis with strep pneumonia bacteremia.  Patient was treated with IV Rocephin.  TEE was done which showed no vegetation.  No PFO or ASD.  Patient was discharged home and he finished full course of 14 days of IV Rocephin on 04/02/2020 per ID recommendation.  Repeat blood culture was also negative.  08/25/20 cycle 10 FOLFIRINOX 09/08/20- present  Starting cycle 11, FOLFIRI, Oxaliplatin discontinued due to neuropathy   #NGS showed no reportable targetable mutation #Genetic testing-[employer] diagnostic testing showed no pathological variants identified. MS stable, TMB 016m/mb, KRAS G12D, SF3B1 K700E, TP53 [id number]  #01/14/2021  [facility].  # [facility] [Th[facility]ton Scrapere 4~5 liver lesions on OSH MRI last year at the time of diagnosis highly suspicious for metastasis. She  not eligible for surgical protocol for metastasis resection given the number of liver metastasis is >3. However given her excellent  and durable response to chemo, surgical resection may be considered pending sustained disease control at 1 year and may require partial hepatectomy first to see if there is viable tumor before proceeding to Whipple resection ]  Patient had a COVID-19 infection in September 2022.  03/23/2021 CT abdomen pelvis No significant change in the ill-defined pancreatic head mass or  associated biliary ductal and pancreatic ductal dilation.  Slight interval enlargement of subcentimeter anterior peripancreatic  lymph node, nonspecific. Attention on follow-up per clinical protocol. Similar enlargement of the ascending thoracic aorta measuring 4.4 cm  03/23/2021 MRI abdomen w and wo  Increasing ill-defined hypoenhancement of the pancreatic head surrounding the common bile duct stent, in keeping with known pancreatic malignancy. Anterior peripancreatic lymph node is better evaluated on CT.  Unchanged position of a common bile duct stent with similar mild diffuse intrahepatic ductal dilation and pneumobilia. No evidence of metastatic disease in the abdomen or pelvis. Partially visualized cystic left adnexal lesion measuring up to 3.9 cm.   04/10/2021, patient underwent liver resection at [facility], by Dr. [name].   Pathology report from [place] was reviewed. Segments 3 partial hepatectomy, negative for viable tumor. Section 5/6 partial hepatectomy part 1 and 2, microscopic foci of residual viable adenocarcinoma, morphologically consistent with history of pancreatic primary.  Parenchymal margin is uninvolved.  #Patient resumed on FOLFIRI on 04/28/2021. She got another cycle on 05/12/2021 #05/26/2021, patient did not get additional chemotherapy due to transaminitis.  Shared decision was made to stop FOLFIRI and switch to gemcitabine Abraxane treatments.  05/26/2021, patient developed transaminitis, AST 763, ALT 691, alkaline phosphatase 364.  Bilirubin 0.7 05/26/2021 stat ultrasound abdomen right upper quadrant showed interval  development of 3.3 x 1.8 cm complex mass in the right hepatic lobe.  Increased extrahepatic ductal dilatation.  Concerning for CBD obstruction. 05/29/2021, MRI abdomen MRCP with and without contrast showed unchanged pancreatic head soft tissue.  Severe intra and extrahepatic biliary ductal dilatation.  Common bile duct stents remain in position however patency is not established.  No evidence of lymphadenopathy or metastatic disease in the abdomen.  With further communication with radiologist, addendum was added that there is internal fluid signal and no appreciable associated contrast enhancement measuring 3.4 x 2.3 cm.  This appearance is generally not consistent with metastasis and is of uncertain significance.  Possibly reflecting hepatic abscess or residual of subcapsular hematoma.  Patient was seen by [facility] Dr. Alwyn Pea team.  He had ERCP done on 06/05/2021, with findings of One stent from the biliary tree was seen in the major papilla. The stent had migrated significantly into the duodenum. This is the cause of stent malfunction. One stent was removed from the biliary tree. Prior biliary sphincterotomy appeared open. - A single severe biliary stricture was found in the lower third of the main bile duct with upstream dilation. The stricture was alignant appearing.- The biliary tree was swept and sludge was found.- One fully covered metal stent was placed into the common bile duct across the stricture.- No pancreatogram performed.  06/12/2021 Gemcitabine and Abraxane.   INTERVAL HISTORY Cassandra [name] is a 66 y.o. female who has above history reviewed by me today presents for pancreatic cancer.  She tolerates cycle 1 D1, D8,Gemcitabine and Abraxane.  She tolerates well,  Review of Systems  Constitutional:  Negative for appetite change, chills, fatigue, fever and unexpected weight change.  HENT:  Negative for hearing loss and voice change.   Eyes:  Negative for eye problems.  Respiratory:  Negative for  chest tightness and cough.   Cardiovascular:  Negative for chest pain.  Gastrointestinal:  Positive for nausea. Negative for abdominal distention, abdominal pain and blood in stool.  Endocrine: Negative for hot flashes.  Genitourinary:  Negative for difficulty urinating and frequency.   Musculoskeletal:  Negative for arthralgias.  Skin:  Negative for itching and rash.  Neurological:  Positive for numbness. Negative for extremity weakness.  Hematological:  Negative for adenopathy.  Psychiatric/Behavioral:  Negative for confusion.    MEDICAL HISTORY:  Past Medical History:  Diagnosis Date   Cancer [facility])    pancreatic cancer   Colon polyps    Family history of breast cancer    Neutropenia (HCC) 04/18/2020   Osteopenia after menopause 05/2017   femoral neck T score -2.0   Personal history of chemotherapy    current for pancreatic ca    SURGICAL HISTORY: Past Surgical History:  Procedure Laterality Date   COLONOSCOPY  07/2015   WNL   COLONOSCOPY  2008/2011   IR CV LINE INJECTION  04/28/2021   OVARIAN CYST REMOVAL  1992   dermoid-Dr CAK   PORTA CATH INSERTION N/A 03/07/2020   Procedure: PORTA CATH INSERTION;  Surgeon: Annice Needy, MD;  Location: [facility] INVASIVE CV LAB;  Service: Cardiovascular;  Laterality: N/A;   TEE WITHOUT CARDIOVERSION N/A 03/21/2020   Procedure: TRANSESOPHAGEAL ECHOCARDIOGRAM (TEE);  Surgeon: Laurier Nancy, MD;  Location: [facility] ORS;  Service: Cardiovascular;  Laterality: N/A;   TUBAL LIGATION  1993    SOCIAL HISTORY: Social History   Socioeconomic History   Marital status: Married    Spouse name: Not on file   Number of children: 2   Years of education: Not on file   Highest education level: Not on file  Occupational History   Occupation: Runner, broadcasting/film/video    Comment: retired   Occupation: Visual merchandiser  Tobacco Use   Smoking status: Former    Packs/day: 0.50    Years: 10.00    Pack years: 5.00    Types: Cigarettes    Quit date: 1987    Years since quitting:  36.0   Smokeless tobacco: Never   Tobacco comments:    Quit smoking 1987  Vaping Use   Vaping Use: Never used  Substance and Sexual Activity   Alcohol use: Not Currently    Comment: 0-2 mixed drinks a day   Drug use: No   Sexual activity: Not Currently    Birth control/protection: Post-menopausal  Other Topics Concern   Not on file  Social History Narrative   Not on file   Social Determinants of Health   Financial Resource Strain: Not on file  Food Insecurity: Not on file  Transportation Needs: Not on file  Physical Activity: Not on file  Stress: Not on file  Social Connections: Not on file  Intimate Partner Violence: Not on file    FAMILY HISTORY: Family History  Problem Relation Age of Onset   Breast cancer Paternal Aunt 66   Diabetes Mother    Osteoporosis Mother    Hyperlipidemia Father    Rheumatic fever Father    Valvular heart disease Father    Cancer Paternal Grandmother        possible colon   Breast cancer Sister 36    ALLERGIES:  is allergic to penicillin g.  MEDICATIONS:  Current Outpatient Medications  Medication Sig Dispense Refill  Calcium Carbonate-Vit D-Min (CALCIUM 1200 PO) Take by mouth.     Cholecalciferol 25 MCG (1000 UT) tablet Take 2,000 Units by mouth daily.     CREON 24000-76000 units CPEP TAKE 1 CAPSULE (24,000 UNITS TOTAL) BY MOUTH 3 (THREE) TIMES DAILY BEFORE MEALS. 200 capsule 3   Cyanocobalamin (VITAMIN B 12 PO) Take by mouth.     gabapentin (NEURONTIN) 100 MG capsule Take 1 capsule (100 mg total) by mouth at bedtime. 30 capsule 1   Multiple Vitamins-Minerals (MULTIVITAMIN WITH MINERALS) tablet Take 1 tablet by mouth daily.     magic mouthwash w/lidocaine SOLN Take 5 mLs by mouth 4 (four) times daily as needed for mouth pain. Sig: Swish & Spit 5-10 ml four times a day as needed. Dispense 480 ml. 1RF (Patient not taking: Reported on 12/17/2020) 480 mL 1   potassium chloride SA (KLOR-CON M20) 20 MEQ tablet Take 1 tablet (20 mEq total)  by mouth daily. (Patient not taking: Reported on 07/03/2021) 30 tablet 0   simethicone (GAS-X) 80 MG chewable tablet Chew 1 tablet (80 mg total) by mouth every 8 (eight) hours as needed for flatulence. (Patient not taking: Reported on 07/03/2021) 60 tablet 0   No current facility-administered medications for this visit.   Facility-Administered Medications Ordered in Other Visits  Medication Dose Route Frequency Provider Last Rate Last Admin   sodium chloride flush (NS) 0.9 % injection 10 mL  10 mL Intravenous PRN [name], MD   10 mL at 02/18/21 0858   sodium chloride flush (NS) 0.9 % injection 10 mL  10 mL Intravenous Once Borders, Vonna Kotyk R, NP       sodium chloride flush (NS) 0.9 % injection 10 mL  10 mL Intravenous Once [name], MD         PHYSICAL EXAMINATION: ECOG PERFORMANCE STATUS: 0 - Asymptomatic Vitals:   07/03/21 0833  BP: 111/80  Pulse: 60  Resp: 18  Temp: 97.7 F (36.5 C)   Filed Weights   07/03/21 0833  Weight: 138 lb 1.6 oz (62.6 kg)    Physical Exam Constitutional:      General: She is not in acute distress. HENT:     Head: Normocephalic and atraumatic.  Eyes:     General: No scleral icterus. Cardiovascular:     Rate and Rhythm: Normal rate and regular rhythm.     Heart sounds: Normal heart sounds.  Pulmonary:     Effort: Pulmonary effort is normal. No respiratory distress.     Breath sounds: No wheezing.  Abdominal:     General: Bowel sounds are normal. There is no distension.     Palpations: Abdomen is soft.  Musculoskeletal:        General: No deformity. Normal range of motion.     Cervical back: Normal range of motion and neck supple.  Skin:    General: Skin is warm and dry.     Coloration: Skin is not jaundiced.     Findings: No erythema or rash.  Neurological:     Mental Status: She is alert and oriented to person, place, and time. Mental status is at baseline.     Cranial Nerves: No cranial nerve deficit.     Coordination: Coordination normal.   Psychiatric:        Mood and Affect: Mood normal.    LABORATORY DATA:  I have reviewed the data as listed Lab Results  Component Value Date   WBC 3.1 (L) 07/03/2021   HGB 10.8 (L)  07/03/2021   HCT 31.9 (L) 07/03/2021   MCV 94.7 07/03/2021   PLT 147 (L) 07/03/2021   Recent Labs    06/12/21 0816 06/26/21 0845 07/03/21 0818  NA 140 138 137  K 4.1 4.3 4.5  CL 106 104 102  CO2 $Re'25 26 28  'oqV$ GLUCOSE 94 99 104*  BUN $Re'19 19 17  'joO$ CREATININE 0.64 0.71 0.68  CALCIUM 9.0 8.8* 8.8*  GFRNONAA >60 >60 >60  PROT 6.7 6.4* 6.4*  ALBUMIN 3.8 3.7 3.7  AST 40 26 32  ALT 84* 27 49*  ALKPHOS 295* 148* 187*  BILITOT 0.4 0.3 0.4    Iron/TIBC/Ferritin/ %Sat No results found for: IRON, TIBC, FERRITIN, IRONPCTSAT    RADIOGRAPHIC STUDIES: I have personally reviewed the radiological images as listed and agreed with the findings in the report. Reviewed findings of MRI abdomen MRCP done at [facility]. MR 3D Recon At Scanner  Addendum Date: 05/29/2021   ADDENDUM REPORT: 05/29/2021 08:43 ADDENDUM: There is a subcapsular, nonenhancing lesion of the inferior tip of the right lobe of the liver, hepatic segment VII, adjacent to the gallbladder fossa, with internal fluid signal and no appreciable associated contrast enhancement measuring 3.4 x 2.3 cm. This appearance is generally not consistent with metastasis and is of uncertain significance, possibly reflecting a hepatic abscess in the setting of biliary obstruction or residua of a subcapsular hematoma. Findings discussed with Dr. Tasia Catchings on 05/27/2021 and 05/29/2021. Electronically Signed   By: Delanna Ahmadi M.D.   On: 05/29/2021 08:43   Result Date: 05/29/2021 CLINICAL DATA:  Pancreatic cancer, elevated LFTs EXAM: MRI ABDOMEN WITHOUT AND WITH CONTRAST (INCLUDING MRCP) TECHNIQUE: Multiplanar multisequence MR imaging of the abdomen was performed both before and after the administration of intravenous contrast. Heavily T2-weighted images of the biliary and pancreatic  ducts were obtained, and three-dimensional MRCP images were rendered by post processing. CONTRAST:  10mL GADAVIST GADOBUTROL 1 MMOL/ML IV SOLN COMPARISON:  CT chest abdomen pelvis, 10/31/2020 FINDINGS: Lower chest: No acute findings. Hepatobiliary: Severe intra and extrahepatic biliary ductal dilatation, increased compared to prior CT. The common bile duct measures 1.8 cm, previously 1.1 cm. Common bile duct stent remains in position. Portal veins are patent. Pancreas: Unchanged post treatment appearance of pancreatic head soft tissue, although ill-defined measuring approximately 2.9 x 2.6 cm (series 14, image 40). Severe atrophy of the pancreatic parenchyma distally with prominence of the pancreatic duct. Spleen:  Within normal limits in size and appearance. Adrenals/Urinary Tract: No masses identified. No evidence of hydronephrosis. Stomach/Bowel: Visualized portions within the abdomen are unremarkable. Vascular/Lymphatic: No pathologically enlarged lymph nodes identified. No abdominal aortic aneurysm demonstrated. Other:  None. Musculoskeletal: No suspicious bone lesions identified. IMPRESSION: 1. Unchanged post treatment appearance of pancreatic head soft tissue, in keeping with primary pancreatic malignancy. 2. Severe intra and extrahepatic biliary ductal dilatation, increased compared to prior CT. Common bile duct stent remains in position, however patency is not established on this MR examination. 3. No evidence of lymphadenopathy or metastatic disease in the abdomen. Electronically Signed: By: Delanna Ahmadi M.D. On: 05/27/2021 13:31   IR CV Line Injection  Result Date: 04/28/2021 CLINICAL DATA:  no blood return EXAM: PORT  CATHETER INJECTION UNDER FLUOROSCOPY TECHNIQUE: The procedure, risks (including but not limited to bleeding, infection, organ damage ), benefits, and alternatives were explained to the patient. Questions regarding the procedure were encouraged and answered. The patient understands and  consents to the procedure. Survey fluoroscopic inspection reveals good position of right IJ power injectable port catheter,  tip in the proximal right atrium. No catheter discontinuity or fracture. Injection demonstrates patency of the system. No extravasation. No evidence of fibrin sheath. Contrast flows freely into the right atrium. IMPRESSION: 1. Normal appearance and function of right IJ power injectable port catheter. Okay for routine use. Consider Cathflo dwell per protocol if aspiration issues persist. Electronically Signed   By: Lucrezia Europe M.D.   On: 04/28/2021 11:20   MR ABDOMEN MRCP W WO CONTAST  Addendum Date: 05/29/2021   ADDENDUM REPORT: 05/29/2021 08:43 ADDENDUM: There is a subcapsular, nonenhancing lesion of the inferior tip of the right lobe of the liver, hepatic segment VII, adjacent to the gallbladder fossa, with internal fluid signal and no appreciable associated contrast enhancement measuring 3.4 x 2.3 cm. This appearance is generally not consistent with metastasis and is of uncertain significance, possibly reflecting a hepatic abscess in the setting of biliary obstruction or residua of a subcapsular hematoma. Findings discussed with Dr. Tasia Catchings on 05/27/2021 and 05/29/2021. Electronically Signed   By: Delanna Ahmadi M.D.   On: 05/29/2021 08:43   Result Date: 05/29/2021 CLINICAL DATA:  Pancreatic cancer, elevated LFTs EXAM: MRI ABDOMEN WITHOUT AND WITH CONTRAST (INCLUDING MRCP) TECHNIQUE: Multiplanar multisequence MR imaging of the abdomen was performed both before and after the administration of intravenous contrast. Heavily T2-weighted images of the biliary and pancreatic ducts were obtained, and three-dimensional MRCP images were rendered by post processing. CONTRAST:  88mL GADAVIST GADOBUTROL 1 MMOL/ML IV SOLN COMPARISON:  CT chest abdomen pelvis, 10/31/2020 FINDINGS: Lower chest: No acute findings. Hepatobiliary: Severe intra and extrahepatic biliary ductal dilatation, increased compared to  prior CT. The common bile duct measures 1.8 cm, previously 1.1 cm. Common bile duct stent remains in position. Portal veins are patent. Pancreas: Unchanged post treatment appearance of pancreatic head soft tissue, although ill-defined measuring approximately 2.9 x 2.6 cm (series 14, image 40). Severe atrophy of the pancreatic parenchyma distally with prominence of the pancreatic duct. Spleen:  Within normal limits in size and appearance. Adrenals/Urinary Tract: No masses identified. No evidence of hydronephrosis. Stomach/Bowel: Visualized portions within the abdomen are unremarkable. Vascular/Lymphatic: No pathologically enlarged lymph nodes identified. No abdominal aortic aneurysm demonstrated. Other:  None. Musculoskeletal: No suspicious bone lesions identified. IMPRESSION: 1. Unchanged post treatment appearance of pancreatic head soft tissue, in keeping with primary pancreatic malignancy. 2. Severe intra and extrahepatic biliary ductal dilatation, increased compared to prior CT. Common bile duct stent remains in position, however patency is not established on this MR examination. 3. No evidence of lymphadenopathy or metastatic disease in the abdomen. Electronically Signed: By: Delanna Ahmadi M.D. On: 05/27/2021 13:31   US Abdomen Limited RUQ (LIVER/GB)  Result Date: 05/26/2021 CLINICAL DATA:  Abnormal liver enzymes. EXAM: ULTRASOUND ABDOMEN LIMITED RIGHT UPPER QUADRANT COMPARISON:  March 19, 2021.  Oct 30, 2020. FINDINGS: Gallbladder: No gallstones or wall thickening visualized. No sonographic Murphy sign noted by sonographer. Common bile duct: Diameter: 16 mm which is increased compared to prior exam. Liver: 3.3 x 1.8 cm complex mass is seen in right hepatic lobe peripherally. Mild intrahepatic biliary dilatation is noted. Within normal limits in parenchymal echogenicity. Portal vein is patent on color Doppler imaging with normal direction of blood flow towards the liver. Other: None. IMPRESSION: Interval  development of 3.3 x 1.8 cm complex mass in right hepatic lobe. CT or MRI with intravenous contrast is recommended to evaluate for metastatic disease or other neoplasm. Increased extrahepatic ductal dilatation is noted concerning for distal common bile duct obstruction. Correlation  with liver function tests is recommended. Electronically Signed   By: Marijo Conception M.D.   On: 05/26/2021 14:18      ASSESSMENT & PLAN:  1. Primary pancreatic cancer ([place])   2. 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 Pancreatic insufficiency   5. 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  Pathology proved residual disease  Labs are reviewed and discussed with patient. Proceed with cycle 1 day 15 gemcitabine/Abraxane- dose reduced -$RemoveBefor'100mg'cEbEkhfJSodG$ /m2 due to neuropathy.Marland Kitchen   #Chemotherapy-induced neutropenia, ANC 1.3.  Close monitor. #Chemotherapy-induced neuropathy of fingertips and toes, grade 2.  Follow-up with neurology.  Not tolerating Cymbalta.  Dose reduced Abraxane symptoms are stable.  #pancreatic insufficiency.  Continue Creon, continue 24000 units TID with meals.  Weight is stable.  #Elevated CEA,-  Patient is due for colonoscopy and has appt with gastroenterology for colonoscopy. # Chemotherapy induced anemia  All questions were answered. The patient knows to call the clinic with any problems questions or concerns.  Return of visit: 2 week for cycle 2 D1 chemo.  [name], MD, PhD  07/03/2021

## 2021-07-03 NOTE — Progress Notes (Signed)
Patient here for follow. Pt reports that she got a Covid vaccine on Monday.

## 2021-07-09 ENCOUNTER — Encounter: Payer: Self-pay | Admitting: *Deleted

## 2021-07-10 ENCOUNTER — Other Ambulatory Visit: Payer: Self-pay

## 2021-07-10 ENCOUNTER — Encounter: Payer: Self-pay | Admitting: *Deleted

## 2021-07-10 ENCOUNTER — Ambulatory Visit: Payer: Medicare Other | Admitting: Anesthesiology

## 2021-07-10 ENCOUNTER — Encounter
Admission: RE | Disposition: A | Discharge status: Home / Self Care | Payer: Self-pay | Source: Home / Self Care | Attending: Gastroenterology

## 2021-07-10 ENCOUNTER — Ambulatory Visit
Admission: RE | Admit: 2021-07-10 | Discharge: 2021-07-10 | Disposition: A | Discharge status: Home / Self Care | Payer: Medicare Other | Attending: Gastroenterology | Admitting: Gastroenterology

## 2021-07-10 DIAGNOSIS — Z86010 Personal history of colonic polyps: Secondary | ICD-10-CM | POA: Diagnosis not present

## 2021-07-10 DIAGNOSIS — Z87891 Personal history of nicotine dependence: Secondary | ICD-10-CM | POA: Diagnosis not present

## 2021-07-10 DIAGNOSIS — K640 First degree hemorrhoids: Secondary | ICD-10-CM | POA: Insufficient documentation

## 2021-07-10 DIAGNOSIS — C259 Malignant neoplasm of pancreas, unspecified: Secondary | ICD-10-CM | POA: Insufficient documentation

## 2021-07-10 DIAGNOSIS — Z1211 Encounter for screening for malignant neoplasm of colon: Secondary | ICD-10-CM | POA: Insufficient documentation

## 2021-07-10 HISTORY — DX: Nausea with vomiting, unspecified: R11.2

## 2021-07-10 HISTORY — DX: Other specified postprocedural states: Z98.890

## 2021-07-10 HISTORY — PX: COLONOSCOPY WITH PROPOFOL: SHX5780

## 2021-07-10 HISTORY — DX: Drug-induced polyneuropathy: G62.0

## 2021-07-10 HISTORY — DX: Pure hypercholesterolemia, unspecified: E78.00

## 2021-07-10 HISTORY — DX: Anemia, unspecified: D64.9

## 2021-07-10 SURGERY — COLONOSCOPY WITH PROPOFOL
Anesthesia: General

## 2021-07-10 MED ORDER — PROPOFOL 500 MG/50ML IV EMUL
INTRAVENOUS | Status: DC | PRN
  Administered 2021-07-10: 165 ug/kg/min via INTRAVENOUS

## 2021-07-10 MED ORDER — PROPOFOL 10 MG/ML IV BOLUS
INTRAVENOUS | Status: DC | PRN
Start: 2021-07-10 — End: 2021-07-10
  Administered 2021-07-10: 60 mg via INTRAVENOUS

## 2021-07-10 MED ORDER — SODIUM CHLORIDE 0.9 % IV SOLN: INTRAVENOUS | Status: DC

## 2021-07-10 MED ORDER — PROPOFOL 500 MG/50ML IV EMUL
INTRAVENOUS | Status: AC
  Filled 2021-07-10: qty 50

## 2021-07-10 NOTE — Anesthesia Postprocedure Evaluation (Signed)
Anesthesia Post Note  Patient: [name]  Procedure(s) Performed: COLONOSCOPY WITH PROPOFOL  Patient location during evaluation: Endoscopy Anesthesia Type: General Level of consciousness: awake and alert Pain management: pain level controlled Vital Signs Assessment: post-procedure vital signs reviewed and stable Respiratory status: spontaneous breathing, nonlabored ventilation, respiratory function stable and patient connected to nasal cannula oxygen Cardiovascular status: blood pressure returned to baseline and stable Postop Assessment: no apparent nausea or vomiting Anesthetic complications: no   No notable events documented.   Last Vitals:  Vitals:   07/10/21 1001 07/10/21 1108  BP: 109/78 102/68  Pulse: 70 63  Resp: 16 11  Temp: (!) 36.2 C   SpO2: 100% 98%    Last Pain:  Vitals:   07/10/21 1128  TempSrc:   PainSc: 0-No pain                 Precious Haws Jahid Weida

## 2021-07-10 NOTE — Op Note (Signed)
[facility] Gastroenterology Patient Name: Cassandra [name] Procedure Date: 07/10/2021 10:34 AM MRN: [id number] Account #: [id number] Date of Birth: 09-Jul-1955 Admit Type: Outpatient Age: 66 Room: [facility] ENDO ROOM 3 Gender: Female Note Status: Finalized Instrument Name: Jasper Riling [id number] Procedure:             Colonoscopy Indications:           High risk colon cancer surveillance: Personal history                         of non-advanced adenoma, Last colonoscopy 5 years ago Providers:             Andrey Farmer MD, MD Referring MD:          [name]. Bacigalupo (Referring MD) Medicines:             Monitored Anesthesia Care Complications:         No immediate complications. Procedure:             Pre-Anesthesia Assessment:                        - Prior to the procedure, a History and Physical was                         performed, and patient medications and allergies were                         reviewed. The patient is competent. The risks and                         benefits of the procedure and the sedation options and                         risks were discussed with the patient. All questions                         were answered and informed consent was obtained.                         Patient identification and proposed procedure were                         verified by the physician, the nurse, the                         anesthesiologist, the anesthetist and the technician                         in the endoscopy suite. Mental Status Examination:                         alert and oriented. Airway Examination: normal                         oropharyngeal airway and neck mobility. Respiratory                         Examination: clear to auscultation. CV Examination:  normal. Prophylactic Antibiotics: The patient does not                         require prophylactic antibiotics. Prior                         Anticoagulants: The patient  has taken no previous                         anticoagulant or antiplatelet agents. ASA Grade                         Assessment: III - A patient with severe systemic                         disease. After reviewing the risks and benefits, the                         patient was deemed in satisfactory condition to                         undergo the procedure. The anesthesia plan was to use                         monitored anesthesia care (MAC). Immediately prior to                         administration of medications, the patient was                         re-assessed for adequacy to receive sedatives. The                         heart rate, respiratory rate, oxygen saturations,                         blood pressure, adequacy of pulmonary ventilation, and                         response to care were monitored throughout the                         procedure. The physical status of the patient was                         re-assessed after the procedure.                        After obtaining informed consent, the colonoscope was                         passed under direct vision. Throughout the procedure,                         the patient's blood pressure, pulse, and oxygen                         saturations were monitored continuously. The  Colonoscope was introduced through the anus and                         advanced to the the cecum, identified by appendiceal                         orifice and ileocecal valve. The colonoscopy was                         performed without difficulty. The patient tolerated                         the procedure well. The quality of the bowel                         preparation was adequate to identify polyps. Findings:      The perianal and digital rectal examinations were normal.      Internal hemorrhoids were found during retroflexion. The hemorrhoids       were Grade I (internal hemorrhoids that do not prolapse).       The exam was otherwise without abnormality on direct and retroflexion       views. Impression:            - Internal hemorrhoids.                        - The examination was otherwise normal on direct and                         retroflexion views.                        - No specimens collected. Recommendation:        - Discharge patient to home.                        - Resume previous diet.                        - Continue present medications.                        - Repeat colonoscopy in 5-10 years for surveillance if                         benefits outweigh risks.                        - Return to referring physician as previously                         scheduled. Procedure Code(s):     --- Professional ---                        A1937, Colorectal cancer screening; colonoscopy on                         individual at high risk Diagnosis Code(s):     --- Professional ---  Z86.010, Personal history of colonic polyps                        K64.0, First degree hemorrhoids CPT copyright 2019 American Medical Association. All rights reserved. The codes documented in this report are preliminary and upon coder review may  be revised to meet current compliance requirements. Andrey Farmer MD, MD 07/10/2021 11:06:16 AM Number of Addenda: 0 Note Initiated On: 07/10/2021 10:34 AM Scope Withdrawal Time: 0 hours 10 minutes 59 seconds  Total Procedure Duration: 0 hours 18 minutes 33 seconds  Estimated Blood Loss:  Estimated blood loss: none.      [facility]

## 2021-07-10 NOTE — Anesthesia Preprocedure Evaluation (Signed)
Anesthesia Evaluation  Patient identified by MRN, date of birth, ID band Patient awake    Reviewed: Allergy & Precautions, NPO status , Patient's Chart, lab work & pertinent test results  History of Anesthesia Complications (+) PONV and history of anesthetic complications  Airway Mallampati: III  TM Distance: <3 FB Neck ROM: full    Dental  (+) Chipped   Pulmonary neg shortness of breath, former smoker,    Pulmonary exam normal        Cardiovascular Exercise Tolerance: Good (-) angina(-) DOE negative cardio ROS Normal cardiovascular exam     Neuro/Psych TIA Neuromuscular disease negative psych ROS   GI/Hepatic negative GI ROS, Neg liver ROS, neg GERD  ,  Endo/Other  negative endocrine ROS  Renal/GU negative Renal ROS  negative genitourinary   Musculoskeletal   Abdominal   Peds  Hematology negative hematology ROS (+)   Anesthesia Other Findings Past Medical History: No date: Anemia No date: Cancer [facility])     Comment:  pancreatic cancer No date: Colon polyps No date: Family history of breast cancer No date: 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 04/18/2020: Neutropenia ([place]) 05/2017: Osteopenia after menopause     Comment:  femoral neck T score -2.0 No date: Personal history of chemotherapy     Comment:  current for pancreatic ca No date: PONV (postoperative nausea and vomiting) No date: Pure hypercholesterolemia  Past Surgical History: 07/2015: COLONOSCOPY     Comment:  WNL 2008/2011: COLONOSCOPY 04/28/2021: IR CV LINE INJECTION 1992: OVARIAN CYST REMOVAL     Comment:  dermoid-Dr Vito Backers 03/07/2020: PORTA CATH INSERTION; N/A     Comment:  Procedure: PORTA CATH INSERTION;  Surgeon: Algernon Huxley,              MD;  [facility];  Service:               Cardiovascular;  Laterality: N/A; 03/21/2020: TEE WITHOUT CARDIOVERSION; N/A     Comment:  Procedure: TRANSESOPHAGEAL  ECHOCARDIOGRAM (TEE);                Surgeon: Dionisio David, MD;  Location: [facility] ORS;                Service: Cardiovascular;  Laterality: N/A; 1993: TUBAL LIGATION  BMI    Body Mass Index: 21.61 kg/m      Reproductive/Obstetrics negative OB ROS                             Anesthesia Physical Anesthesia Plan  ASA: 3  Anesthesia Plan: General   Post-op Pain Management:    Induction: Intravenous  PONV Risk Score and Plan: Propofol infusion and TIVA  Airway Management Planned: Natural Airway and Nasal Cannula  Additional Equipment:   Intra-op Plan:   Post-operative Plan:   Informed Consent: I have reviewed the patients History and Physical, chart, labs and discussed the procedure including the risks, benefits and alternatives for the proposed anesthesia with the patient or authorized representative who has indicated his/her understanding and acceptance.     Dental Advisory Given  Plan Discussed with: Anesthesiologist, CRNA and Surgeon  Anesthesia Plan Comments: (Patient consented for risks of anesthesia including but not limited to:  - adverse reactions to medications - risk of airway placement if required - damage to eyes, teeth, lips or other oral mucosa - nerve damage due to positioning  -  sore throat or hoarseness - Damage to heart, brain, nerves, lungs, other parts of body or loss of life  Patient voiced understanding.)        Anesthesia Quick Evaluation

## 2021-07-10 NOTE — Transfer of Care (Signed)
Immediate Anesthesia Transfer of Care Note  Patient: Cassandra [name]  Procedure(s) Performed: COLONOSCOPY WITH PROPOFOL  Patient Location: PACU  Anesthesia Type:General  Level of Consciousness: awake and alert   Airway & Oxygen Therapy: Patient Spontanous Breathing and Patient connected to nasal cannula oxygen  Post-op Assessment: Report given to RN and Post -op Vital signs reviewed and stable  Post vital signs: Reviewed and stable  Last Vitals:  Vitals Value Taken Time  BP    Temp    Pulse    Resp    SpO2      Last Pain:  Vitals:   07/10/21 1001  TempSrc: Temporal  PainSc: 0-No pain         Complications: No notable events documented.

## 2021-07-10 NOTE — H&P (Signed)
Outpatient short stay form Pre-procedure 07/10/2021  Cassandra Rubenstein, MD  Primary Physician: [name], MD  Reason for visit:  Surveillance colonoscopy  History of present illness:    66 y/o lady with history of pancreatic cancer with plans for possible whipple here for surveillance colonoscopy. Last colonoscopy was 2017 and was normal. No blood thinners. Had laparotomy to eval for liver lesions. No family history of colon cancer.    Current Facility-Administered Medications:    0.9 %  sodium chloride infusion, , Intravenous, Continuous, Naika Noto, Hilton Cork, MD, Last Rate: 20 mL/hr at 07/10/21 1017, New Bag at 07/10/21 1017  Facility-Administered Medications Ordered in Other Encounters:    sodium chloride flush (NS) 0.9 % injection 10 mL, 10 mL, Intravenous, PRN, [name], MD, 10 mL at 02/18/21 0858   sodium chloride flush (NS) 0.9 % injection 10 mL, 10 mL, Intravenous, Once, Borders, Vonna Kotyk R, NP   sodium chloride flush (NS) 0.9 % injection 10 mL, 10 mL, Intravenous, Once, [name], MD  Medications Prior to Admission  Medication Sig Dispense Refill Last Dose   CREON 24000-76000 units CPEP TAKE 1 CAPSULE (24,000 UNITS TOTAL) BY MOUTH 3 (THREE) TIMES DAILY BEFORE MEALS. 200 capsule 3 Past Week   Cyanocobalamin (VITAMIN B 12 PO) Take by mouth.   Past Week   Multiple Vitamins-Minerals (MULTIVITAMIN WITH MINERALS) tablet Take 1 tablet by mouth daily.   Past Week   Calcium Carbonate-Vit D-Min (CALCIUM 1200 PO) Take by mouth.      Cholecalciferol 25 MCG (1000 UT) tablet Take 2,000 Units by mouth daily.      gabapentin (NEURONTIN) 100 MG capsule Take 1 capsule (100 mg total) by mouth at bedtime. 30 capsule 1    magic mouthwash w/lidocaine SOLN Take 5 mLs by mouth 4 (four) times daily as needed for mouth pain. Sig: Swish & Spit 5-10 ml four times a day as needed. Dispense 480 ml. 1RF (Patient not taking: Reported on 12/17/2020) 480 mL 1    potassium chloride SA (KLOR-CON M20) 20  MEQ tablet Take 1 tablet (20 mEq total) by mouth daily. (Patient not taking: Reported on 07/03/2021) 30 tablet 0    simethicone (GAS-X) 80 MG chewable tablet Chew 1 tablet (80 mg total) by mouth every 8 (eight) hours as needed for flatulence. 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    Physical Exam  Gen: Alert, oriented. Appears stated age.  HEENT: PERRLA. Lungs: No respiratory distress CV: RRR Abd: soft, benign, no masses Ext: No edema    Planned procedures: Proceed with colonoscopy. The patient understands the nature of the planned procedure, indications, risks, alternatives and potential complications including but not limited to bleeding, infection, perforation, damage to internal organs and possible oversedation/side effects from anesthesia. The  patient agrees and gives consent to proceed.  Please refer to procedure notes for findings, recommendations and patient disposition/instructions.     Cassandra Rubenstein, MD [facility] Gastroenterology

## 2021-07-10 NOTE — Interval H&P Note (Signed)
History and Physical Interval Note:  07/10/2021 10:35 AM  Cassandra [name]  has presented today for surgery, with the diagnosis of H/O Colon Polyps.  The various methods of treatment have been discussed with the patient and family. After consideration of risks, benefits and other options for treatment, the patient has consented to  Procedure(s): COLONOSCOPY WITH PROPOFOL (N/A) as a surgical intervention.  The patient's history has been reviewed, patient examined, no change in status, stable for surgery.  I have reviewed the patient's chart and labs.  Questions were answered to the patient's satisfaction.     [name]  Ok to proceed with colonoscopy

## 2021-07-20 ENCOUNTER — Inpatient Hospital Stay: Payer: Medicare Other

## 2021-07-20 ENCOUNTER — Other Ambulatory Visit: Payer: Self-pay

## 2021-07-20 ENCOUNTER — Inpatient Hospital Stay (HOSPITAL_BASED_OUTPATIENT_CLINIC_OR_DEPARTMENT_OTHER): Payer: Medicare Other | Admitting: Oncology

## 2021-07-20 ENCOUNTER — Encounter: Payer: Self-pay | Admitting: Oncology

## 2021-07-20 VITALS — BP 108/76 | HR 63 | Temp 97.6°F | Resp 16 | Wt 139.9 lb

## 2021-07-20 DIAGNOSIS — T451X5A Adverse effect of antineoplastic and immunosuppressive drugs, initial encounter: Secondary | ICD-10-CM

## 2021-07-20 DIAGNOSIS — G620 Drug-induced polyneuropathy: Secondary | ICD-10-CM

## 2021-07-20 DIAGNOSIS — D6481 Anemia due to antineoplastic chemotherapy: Secondary | ICD-10-CM | POA: Diagnosis not present

## 2021-07-20 DIAGNOSIS — K8689 Other specified diseases of pancreas: Secondary | ICD-10-CM

## 2021-07-20 DIAGNOSIS — Z5111 Encounter for antineoplastic chemotherapy: Secondary | ICD-10-CM | POA: Diagnosis not present

## 2021-07-20 DIAGNOSIS — C259 Malignant neoplasm of pancreas, unspecified: Secondary | ICD-10-CM

## 2021-07-20 DIAGNOSIS — D701 Agranulocytosis secondary to cancer chemotherapy: Secondary | ICD-10-CM

## 2021-07-20 LAB — CBC WITH DIFFERENTIAL/PLATELET
Abs Immature Granulocytes: 0.02 10*3/uL (ref 0.00–0.07)
Basophils Absolute: 0 10*3/uL (ref 0.0–0.1)
Basophils Relative: 1 %
Eosinophils Absolute: 0.1 10*3/uL (ref 0.0–0.5)
Eosinophils Relative: 2 %
HCT: 33.5 % — ABNORMAL LOW (ref 36.0–46.0)
Hemoglobin: 11.2 g/dL — ABNORMAL LOW (ref 12.0–15.0)
Immature Granulocytes: 1 %
Lymphocytes Relative: 33 %
Lymphs Abs: 1.4 10*3/uL (ref 0.7–4.0)
MCH: 32.7 pg (ref 26.0–34.0)
MCHC: 33.4 g/dL (ref 30.0–36.0)
MCV: 98 fL (ref 80.0–100.0)
Monocytes Absolute: 0.5 10*3/uL (ref 0.1–1.0)
Monocytes Relative: 13 %
Neutro Abs: 2.1 10*3/uL (ref 1.7–7.7)
Neutrophils Relative %: 50 %
Platelets: 370 10*3/uL (ref 150–400)
RBC: 3.42 MIL/uL — ABNORMAL LOW (ref 3.87–5.11)
RDW: 14.6 % (ref 11.5–15.5)
WBC: 4.2 10*3/uL (ref 4.0–10.5)
nRBC: 0 % (ref 0.0–0.2)

## 2021-07-20 LAB — CBC
HCT: 33.7 % — ABNORMAL LOW (ref 36.0–46.0)
Hemoglobin: 11.1 g/dL — ABNORMAL LOW (ref 12.0–15.0)
MCH: 32.2 pg (ref 26.0–34.0)
MCHC: 32.9 g/dL (ref 30.0–36.0)
MCV: 97.7 fL (ref 80.0–100.0)
Platelets: 338 10*3/uL (ref 150–400)
RBC: 3.45 MIL/uL — ABNORMAL LOW (ref 3.87–5.11)
RDW: 14.5 % (ref 11.5–15.5)
WBC: 4.2 10*3/uL (ref 4.0–10.5)
nRBC: 0 % (ref 0.0–0.2)

## 2021-07-20 LAB — COMPREHENSIVE METABOLIC PANEL
ALT: 25 U/L (ref 0–44)
AST: 28 U/L (ref 15–41)
Albumin: 3.8 g/dL (ref 3.5–5.0)
Alkaline Phosphatase: 116 U/L (ref 38–126)
Anion gap: 9 (ref 5–15)
BUN: 17 mg/dL (ref 8–23)
CO2: 25 mmol/L (ref 22–32)
Calcium: 8.9 mg/dL (ref 8.9–10.3)
Chloride: 107 mmol/L (ref 98–111)
Creatinine, Ser: 0.72 mg/dL (ref 0.44–1.00)
GFR, Estimated: >60 mL/min (ref >60)
Glucose, Bld: 97 mg/dL (ref 70–99)
Potassium: 4.3 mmol/L (ref 3.5–5.1)
Sodium: 141 mmol/L (ref 135–145)
Total Bilirubin: 0.3 mg/dL (ref 0.3–1.2)
Total Protein: 6.1 g/dL — ABNORMAL LOW (ref 6.5–8.1)

## 2021-07-20 MED ORDER — HEPARIN SOD (PORK) LOCK FLUSH 100 UNIT/ML IV SOLN
INTRAVENOUS | Status: AC
  Filled 2021-07-20: qty 5

## 2021-07-20 MED ORDER — SODIUM CHLORIDE 0.9 % IV SOLN
1000.0000 mg/m2 | Freq: Once | INTRAVENOUS | Status: AC
  Administered 2021-07-20: 1748 mg via INTRAVENOUS
  Filled 2021-07-20: qty 26.3

## 2021-07-20 MED ORDER — PROCHLORPERAZINE MALEATE 10 MG PO TABS
10.0000 mg | ORAL_TABLET | Freq: Once | ORAL | Status: AC
  Administered 2021-07-20: 10 mg via ORAL
  Filled 2021-07-20: qty 1

## 2021-07-20 MED ORDER — SODIUM CHLORIDE 0.9 % IV SOLN
Freq: Once | INTRAVENOUS | Status: AC
  Filled 2021-07-20: qty 250

## 2021-07-20 MED ORDER — PACLITAXEL PROTEIN-BOUND CHEMO INJECTION 100 MG
100.0000 mg/m2 | Freq: Once | INTRAVENOUS | Status: AC
  Administered 2021-07-20: 175 mg via INTRAVENOUS
  Filled 2021-07-20: qty 35

## 2021-07-20 NOTE — Progress Notes (Signed)
Ok per MD to proceed with lab results. Cbc resulted .

## 2021-07-20 NOTE — Progress Notes (Signed)
Hematology/Oncology follow up note [phone] [phone]   Patient Care Team: [name], MD as PCP - General (Family Medicine) Flinchum, Kelby Aline, FNP (Family Medicine) [name], RN as Oncology Nurse Navigator [name], MD as Consulting Physician (Oncology)  REFERRING PROVIDER: [name] [name], MD  CHIEF COMPLAINTS/REASON FOR VISIT:  Follow up for treatment of pancreatic adenocarcinoma  HISTORY OF PRESENTING ILLNESS:   Cassandra [name] is a  66 y.o.  female with PMH listed below was seen in consultation at the request of  [name], MD  for evaluation of pancreatic adenocarcinoma Patient initially presented with jaundice, transaminitis, bilirubin was 9.9.  CA 19-9 was 1874.  Patient also reports unintentional weight loss. 02/08/2020 MRI abdomen and MRCP with and without contrast was done at [facility] which showed pancreatic head mass measuring up to 3 cm, with marked associated narrowing of the portal vein confluence.  SMA is preserved.  Marked intrahepatic and extrahepatic biliary duct dilatation as well as mild dilatation of the main pancreatic duct. Multiple hepatic masses highly concerning for metastatic disease.  Patient underwent EUS on 02/19/2020, which showed irregular mass identified in the pancreatic head, hypoechoic, measured 55mx33mm, sonographic evidence concerning for invasion into the superior mesenteric artery.  There is no sign of significant abnormality in the main pancreatic duct.  Dilatation of common bile duct which measured up to 16 mm.  Region of celiac artery was visualized and showed no signs of significant abnormality.  No lymphadenopathy.  FNA showed adenocarcinoma.  02/19/2020, ERCP, malignant.  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  The stricture was treated with placement of wall flex metal stent.  [facility] ZPia Mauand  was recommended for 3 drug regimen FOLFIRINOX.  [facility]  Patient was referred to establish care today. She denies any pain.  Since stent placement, skin jaundice has improved.  Itchiness has also improved. Patient was accompanied by her husband today.  She has a family history of breast cancer in sister and paternal aunt, colon cancer paternal grandmother.  #No reportable targetable mutation on NGS 9/14/2021cycle 1 FOLFIRINOX.  Patient received oxaliplatin and about 50% of Irinotecan on day 1 and had experienced neurologic symptoms.  She went to ER and working diagnosis is TIA, and eventually I think this is due to irinotecan side effects -irinotecan-associated dysarthria, lip/tongue numbness.  Adjustment was made for Irinotecan to be  infused over 180 minutes.  Atropine 0.5 mg once prior to the irinotecan. No recurrent symptoms.   # 03/19/2020-03/21/2020 patient was admitted due to sepsis with strep pneumonia bacteremia.  Patient was treated with IV Rocephin.  TEE was done which showed no vegetation.  No PFO or ASD.  Patient was discharged home and he finished full course of 14 days of IV Rocephin on 04/02/2020 per ID recommendation.  Repeat blood culture was also negative.  08/25/20 cycle 10 FOLFIRINOX 09/08/20- present  Starting cycle 11, FOLFIRI, Oxaliplatin discontinued due to neuropathy   #NGS showed no reportable targetable mutation #Genetic testing-[employer] diagnostic testing showed no pathological variants identified. MS stable, TMB 016m/mb, KRAS G12D, SF3B1 K700E, TP53 [id number]  #01/14/2021  [facility].  # [facility] [Th[facility] Scrapere 4~5 liver lesions on OSH MRI last year at the time of diagnosis highly suspicious for metastasis. She  not eligible for surgical protocol for metastasis resection given the number of liver metastasis is >3. However given her excellent  and durable response to chemo, surgical resection may be considered pending sustained disease control at 1 year and may require partial hepatectomy first to see if there is viable tumor before proceeding to Whipple resection ]  Patient had a COVID-19 infection in September 2022.  03/23/2021 CT abdomen pelvis No significant change in the ill-defined pancreatic head mass or  associated biliary ductal and pancreatic ductal dilation.  Slight interval enlargement of subcentimeter anterior peripancreatic  lymph node, nonspecific. Attention on follow-up per clinical protocol. Similar enlargement of the ascending thoracic aorta measuring 4.4 cm  03/23/2021 MRI abdomen w and wo  Increasing ill-defined hypoenhancement of the pancreatic head surrounding the common bile duct stent, in keeping with known pancreatic malignancy. Anterior peripancreatic lymph node is better evaluated on CT.  Unchanged position of a common bile duct stent with similar mild diffuse intrahepatic ductal dilation and pneumobilia. No evidence of metastatic disease in the abdomen or pelvis. Partially visualized cystic left adnexal lesion measuring up to 3.9 cm.   04/10/2021, patient underwent liver [facility] at [facility], by Dr. [name].   Pathology report from [place] was reviewed. Segments 3 partial hepatectomy, negative for viable tumor. Section 5/6 partial hepatectomy part 1 and 2, microscopic foci of residual viable adenocarcinoma, morphologically consistent with history of pancreatic primary.  Parenchymal margin is uninvolved.  #Patient resumed on FOLFIRI on 04/28/2021. She got another cycle on 05/12/2021 #05/26/2021, patient did not get additional chemotherapy due to transaminitis.  Shared decision was made to stop FOLFIRI and switch to gemcitabine Abraxane treatments.  05/26/2021, patient developed transaminitis, AST 763, ALT 691, alkaline phosphatase 364.  Bilirubin 0.7 05/26/2021 stat ultrasound abdomen right upper quadrant showed interval  development of 3.3 x 1.8 cm complex mass in the right hepatic lobe.  Increased extrahepatic ductal dilatation.  Concerning for CBD obstruction. 05/29/2021, MRI abdomen MRCP with and without contrast showed unchanged pancreatic head soft tissue.  Severe intra and extrahepatic biliary ductal dilatation.  Common bile duct stents remain in position however patency is not established.  No evidence of lymphadenopathy or metastatic disease in the abdomen.  With further communication with radiologist, addendum was added that there is internal fluid signal and no appreciable associated contrast enhancement measuring 3.4 x 2.3 cm.  This appearance is generally not consistent with metastasis and is of uncertain significance.  Possibly reflecting hepatic abscess or residual of subcapsular hematoma.  Patient was seen by [facility] Dr. Alwyn Pea team.  He had ERCP done on 06/05/2021, with findings of One stent from the biliary tree was seen in the major papilla. The stent had migrated significantly into the duodenum. This is the cause of stent malfunction. One stent was removed from the biliary tree. Prior biliary sphincterotomy appeared open. - A single severe biliary stricture was found in the lower third of the main bile duct with upstream dilation. The stricture was alignant appearing.- The biliary tree was swept and sludge was found.- One fully covered metal stent was placed into the common bile duct across the stricture.- No pancreatogram performed.  06/12/2021 Gemcitabine and Abraxane.   INTERVAL HISTORY Cassandra [name] is a 66 y.o. female who has above history reviewed by me today presents for pancreatic cancer.  She tolerates cycle 1 D1, D8,D15 Gemcitabine and Abraxane.  Overall she tolerates well.  Denies any nausea vomiting diarrhea, abdominal pain.  Review of Systems  Constitutional:  Negative for appetite  change, chills, fatigue, fever and unexpected weight change.  HENT:   Negative for hearing loss and voice change.    Eyes:  Negative for eye problems.  Respiratory:  Negative for chest tightness and cough.   Cardiovascular:  Negative for chest pain.  Gastrointestinal:  Negative for abdominal distention, abdominal pain, blood in stool and nausea.  Endocrine: Negative for hot flashes.  Genitourinary:  Negative for difficulty urinating and frequency.   Musculoskeletal:  Negative for arthralgias.  Skin:  Negative for itching and rash.  Neurological:  Positive for numbness. Negative for extremity weakness.  Hematological:  Negative for adenopathy.  Psychiatric/Behavioral:  Negative for confusion.    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   Neutropenia ([place]) 04/18/2020   Osteopenia after menopause 05/2017   femoral neck T score -2.0   Personal history of chemotherapy    current for pancreatic ca   PONV (postoperative nausea and vomiting)    Pure hypercholesterolemia     SURGICAL HISTORY: Past Surgical History:  Procedure Laterality Date   COLONOSCOPY  07/2015   WNL   COLONOSCOPY  2008/2011   COLONOSCOPY WITH PROPOFOL N/A 07/10/2021   Procedure: COLONOSCOPY WITH PROPOFOL;  Surgeon: [name], MD;  Location: [facility] ENDOSCOPY;  Service: Endoscopy;  Laterality: N/A;   IR CV LINE INJECTION  04/28/2021   OVARIAN CYST REMOVAL  1992   dermoid-Dr CAK   PORTA CATH INSERTION N/A 03/07/2020   Procedure: PORTA CATH INSERTION;  Surgeon: Algernon Huxley, MD;  Location: [place] CV LAB;  Service: Cardiovascular;  Laterality: N/A;   TEE WITHOUT CARDIOVERSION N/A 03/21/2020   Procedure: TRANSESOPHAGEAL ECHOCARDIOGRAM (TEE);  Surgeon: Dionisio David, MD;  Location: [facility] ORS;  Service: Cardiovascular;  Laterality: N/A;   TUBAL LIGATION  1993    SOCIAL HISTORY: Social History   Socioeconomic History   Marital status: Married    Spouse  name: Not on file   Number of children: 2   Years of education: Not on file   Highest education level: Not on file  Occupational History   Occupation: Pharmacist, hospital    Comment: retired   Occupation: Psychologist, sport and exercise  Tobacco Use   Smoking status: Former    Packs/day: 1.00    Years: 12.00    Pack years: 12.00    Types: Cigarettes    Quit date: 1987    Years since quitting: 36.0   Smokeless tobacco: Never   Tobacco comments:    Quit smoking 1987  Vaping Use   Vaping Use: Never used  Substance and Sexual Activity   Alcohol use: Not Currently    Alcohol/week: 7.0 standard drinks    Types: 7 Glasses of wine per week    Comment: 0-2 mixed drinks a day   Drug use: No   Sexual activity: Not Currently    Birth control/protection: Post-menopausal  Other Topics Concern   Not on file  Social History Narrative   Not on file   Social Determinants of Health   Financial Resource Strain: Not on file  Food Insecurity: Not on file  Transportation Needs: Not on file  Physical Activity: Not on file  Stress: Not on file  Social Connections: Not on file  Intimate Partner Violence: Not on file    FAMILY HISTORY:  Family History  Problem Relation Age of Onset   Breast cancer Paternal Aunt 37   Diabetes Mother    Osteoporosis Mother    Hyperlipidemia Father    Rheumatic fever Father    Valvular heart disease Father    Cancer Paternal Grandmother        possible colon   Breast cancer Sister 89    ALLERGIES:  is allergic to penicillin g.  MEDICATIONS:  Current Outpatient Medications  Medication Sig Dispense Refill   Calcium Carbonate-Vit D-Min (CALCIUM 1200 PO) Take by mouth.     Cholecalciferol 25 MCG (1000 UT) tablet Take 2,000 Units by mouth daily.     CREON 24000-76000 units CPEP TAKE 1 CAPSULE (24,000 UNITS TOTAL) BY MOUTH 3 (THREE) TIMES DAILY BEFORE MEALS. 200 capsule 3   Cyanocobalamin (VITAMIN B 12 PO) Take by mouth.     Multiple Vitamins-Minerals (MULTIVITAMIN WITH MINERALS) tablet  Take 1 tablet by mouth daily.     gabapentin (NEURONTIN) 100 MG capsule Take 1 capsule (100 mg total) by mouth at bedtime. (Patient not taking: Reported on 07/20/2021) 30 capsule 1   magic mouthwash w/lidocaine SOLN Take 5 mLs by mouth 4 (four) times daily as needed for mouth pain. Sig: Swish & Spit 5-10 ml four times a day as needed. Dispense 480 ml. 1RF (Patient not taking: Reported on 12/17/2020) 480 mL 1   ondansetron (ZOFRAN-ODT) 4 MG disintegrating tablet Take 4 mg by mouth every 8 (eight) hours as needed. (Patient not taking: Reported on 07/20/2021)     potassium chloride SA (KLOR-CON M20) 20 MEQ tablet Take 1 tablet (20 mEq total) by mouth daily. (Patient not taking: Reported on 07/03/2021) 30 tablet 0   simethicone (GAS-X) 80 MG chewable tablet Chew 1 tablet (80 mg total) by mouth every 8 (eight) hours as needed for flatulence. (Patient not taking: Reported on 07/03/2021) 60 tablet 0   No current facility-administered medications for this visit.   Facility-Administered Medications Ordered in Other Visits  Medication Dose Route Frequency Provider Last Rate Last Admin   sodium chloride flush (NS) 0.9 % injection 10 mL  10 mL Intravenous PRN [name], MD   10 mL at 02/18/21 0858   sodium chloride flush (NS) 0.9 % injection 10 mL  10 mL Intravenous Once Borders, Vonna Kotyk R, NP       sodium chloride flush (NS) 0.9 % injection 10 mL  10 mL Intravenous Once [name], MD         PHYSICAL EXAMINATION: ECOG PERFORMANCE STATUS: 0 - Asymptomatic Vitals:   07/20/21 0859  BP: 108/76  Pulse: 63  Resp: 16  Temp: 97.6 F (36.4 C)  SpO2: 98%   Filed Weights   07/20/21 0859  Weight: 139 lb 14.4 oz (63.5 kg)    Physical Exam Constitutional:      General: She is not in acute distress. HENT:     Head: Normocephalic and atraumatic.  Eyes:     General: No scleral icterus. Cardiovascular:     Rate and Rhythm: Normal rate and regular rhythm.     Heart sounds: Normal heart sounds.  Pulmonary:      Effort: Pulmonary effort is normal. No respiratory distress.     Breath sounds: No wheezing.  Abdominal:     General: Bowel sounds are normal. There is no distension.     Palpations: Abdomen is soft.  Musculoskeletal:        General: No deformity. Normal range of motion.  Cervical back: Normal range of motion and neck supple.  Skin:    General: Skin is warm and dry.     Coloration: Skin is not jaundiced.     Findings: No erythema or rash.  Neurological:     Mental Status: She is alert and oriented to person, place, and time. Mental status is at baseline.     Cranial Nerves: No cranial nerve deficit.     Coordination: Coordination normal.  Psychiatric:        Mood and Affect: Mood normal.    LABORATORY DATA:  I have reviewed the data as listed Lab Results  Component Value Date   WBC 4.2 07/20/2021   WBC 4.2 07/20/2021   HGB 11.1 (L) 07/20/2021   HGB 11.2 (L) 07/20/2021   HCT 33.7 (L) 07/20/2021   HCT 33.5 (L) 07/20/2021   MCV 97.7 07/20/2021   MCV 98.0 07/20/2021   PLT 338 07/20/2021   PLT 370 07/20/2021   Recent Labs    06/26/21 0845 07/03/21 0818 07/20/21 0813  NA 138 137 141  K 4.3 4.5 4.3  CL 104 102 107  CO2 $Re'26 28 25  'CaL$ GLUCOSE 99 104* 97  BUN $Re'19 17 17  'QWK$ CREATININE 0.71 0.68 0.72  CALCIUM 8.8* 8.8* 8.9  GFRNONAA >60 >60 >60  PROT 6.4* 6.4* 6.1*  ALBUMIN 3.7 3.7 3.8  AST 26 32 28  ALT 27 49* 25  ALKPHOS 148* 187* 116  BILITOT 0.3 0.4 0.3    Iron/TIBC/Ferritin/ %Sat No results found for: IRON, TIBC, FERRITIN, IRONPCTSAT    RADIOGRAPHIC STUDIES: I have personally reviewed the radiological images as listed and agreed with the findings in the report. Reviewed findings of MRI abdomen MRCP done [facility]. MR 3D Recon At Scanner  Addendum Date: 05/29/2021   ADDENDUM REPORT: 05/29/2021 08:43 ADDENDUM: There is a subcapsular, nonenhancing lesion of the inferior tip of the right lobe of the liver, hepatic segment VII, adjacent to the gallbladder fossa, with  internal fluid signal and no appreciable associated contrast enhancement measuring 3.4 x 2.3 cm. This appearance is generally not consistent with metastasis and is of uncertain significance, possibly reflecting a hepatic abscess in the setting of biliary obstruction or residua of a subcapsular hematoma. Findings discussed with Dr. Tasia Catchings on 05/27/2021 and 05/29/2021. Electronically Signed   By: Delanna Ahmadi M.D.   On: 05/29/2021 08:43   Result Date: 05/29/2021 CLINICAL DATA:  Pancreatic cancer, elevated LFTs EXAM: MRI ABDOMEN WITHOUT AND WITH CONTRAST (INCLUDING MRCP) TECHNIQUE: Multiplanar multisequence MR imaging of the abdomen was performed both before and after the administration of intravenous contrast. Heavily T2-weighted images of the biliary and pancreatic ducts were obtained, and three-dimensional MRCP images were rendered by post processing. CONTRAST:  67mL GADAVIST GADOBUTROL 1 MMOL/ML IV SOLN COMPARISON:  CT chest abdomen pelvis, 10/31/2020 FINDINGS: Lower chest: No acute findings. Hepatobiliary: Severe intra and extrahepatic biliary ductal dilatation, increased compared to prior CT. The common bile duct measures 1.8 cm, previously 1.1 cm. Common bile duct stent remains in position. Portal veins are patent. Pancreas: Unchanged post treatment appearance of pancreatic head soft tissue, although ill-defined measuring approximately 2.9 x 2.6 cm (series 14, image 40). Severe atrophy of the pancreatic parenchyma distally with prominence of the pancreatic duct. Spleen:  Within normal limits in size and appearance. Adrenals/Urinary Tract: No masses identified. No evidence of hydronephrosis. Stomach/Bowel: Visualized portions within the abdomen are unremarkable. Vascular/Lymphatic: No pathologically enlarged lymph nodes identified. No abdominal aortic aneurysm demonstrated. Other:  None. Musculoskeletal:  No suspicious bone lesions identified. IMPRESSION: 1. Unchanged post treatment appearance of pancreatic head  soft tissue, in keeping with primary pancreatic malignancy. 2. Severe intra and extrahepatic biliary ductal dilatation, increased compared to prior CT. Common bile duct stent remains in position, however patency is not established on this MR examination. 3. No evidence of lymphadenopathy or metastatic disease in the abdomen. Electronically Signed: By: Delanna Ahmadi M.D. On: 05/27/2021 13:31   IR CV Line Injection  Result Date: 04/28/2021 CLINICAL DATA:  no blood return EXAM: PORT  CATHETER INJECTION UNDER FLUOROSCOPY TECHNIQUE: The procedure, risks (including but not limited to bleeding, infection, organ damage ), benefits, and alternatives were explained to the patient. Questions regarding the procedure were encouraged and answered. The patient understands and consents to the procedure. Survey fluoroscopic inspection reveals good position of right IJ power injectable port catheter, tip in the proximal right atrium. No catheter discontinuity or fracture. Injection demonstrates patency of the system. No extravasation. No evidence of fibrin sheath. Contrast flows freely into the right atrium. IMPRESSION: 1. Normal appearance and function of right IJ power injectable port catheter. Okay for routine use. Consider Cathflo dwell per protocol if aspiration issues persist. Electronically Signed   By: Lucrezia Europe M.D.   On: 04/28/2021 11:20   MR ABDOMEN MRCP W WO CONTAST  Addendum Date: 05/29/2021   ADDENDUM REPORT: 05/29/2021 08:43 ADDENDUM: There is a subcapsular, nonenhancing lesion of the inferior tip of the right lobe of the liver, hepatic segment VII, adjacent to the gallbladder fossa, with internal fluid signal and no appreciable associated contrast enhancement measuring 3.4 x 2.3 cm. This appearance is generally not consistent with metastasis and is of uncertain significance, possibly reflecting a hepatic abscess in the setting of biliary obstruction or residua of a subcapsular hematoma. Findings discussed  with Dr. Tasia Catchings on 05/27/2021 and 05/29/2021. Electronically Signed   By: Delanna Ahmadi M.D.   On: 05/29/2021 08:43   Result Date: 05/29/2021 CLINICAL DATA:  Pancreatic cancer, elevated LFTs EXAM: MRI ABDOMEN WITHOUT AND WITH CONTRAST (INCLUDING MRCP) TECHNIQUE: Multiplanar multisequence MR imaging of the abdomen was performed both before and after the administration of intravenous contrast. Heavily T2-weighted images of the biliary and pancreatic ducts were obtained, and three-dimensional MRCP images were rendered by post processing. CONTRAST:  2mL GADAVIST GADOBUTROL 1 MMOL/ML IV SOLN COMPARISON:  CT chest abdomen pelvis, 10/31/2020 FINDINGS: Lower chest: No acute findings. Hepatobiliary: Severe intra and extrahepatic biliary ductal dilatation, increased compared to prior CT. The common bile duct measures 1.8 cm, previously 1.1 cm. Common bile duct stent remains in position. Portal veins are patent. Pancreas: Unchanged post treatment appearance of pancreatic head soft tissue, although ill-defined measuring approximately 2.9 x 2.6 cm (series 14, image 40). Severe atrophy of the pancreatic parenchyma distally with prominence of the pancreatic duct. Spleen:  Within normal limits in size and appearance. Adrenals/Urinary Tract: No masses identified. No evidence of hydronephrosis. Stomach/Bowel: Visualized portions within the abdomen are unremarkable. Vascular/Lymphatic: No pathologically enlarged lymph nodes identified. No abdominal aortic aneurysm demonstrated. Other:  None. Musculoskeletal: No suspicious bone lesions identified. IMPRESSION: 1. Unchanged post treatment appearance of pancreatic head soft tissue, in keeping with primary pancreatic malignancy. 2. Severe intra and extrahepatic biliary ductal dilatation, increased compared to prior CT. Common bile duct stent remains in position, however patency is not established on this MR examination. 3. No evidence of lymphadenopathy or metastatic disease in the abdomen.  Electronically Signed: By: Delanna Ahmadi M.D. On: 05/27/2021 13:31   US  Abdomen Limited RUQ (LIVER/GB)  Result Date: 05/26/2021 CLINICAL DATA:  Abnormal liver enzymes. EXAM: ULTRASOUND ABDOMEN LIMITED RIGHT UPPER QUADRANT COMPARISON:  March 19, 2021.  Oct 30, 2020. FINDINGS: Gallbladder: No gallstones or wall thickening visualized. No sonographic Murphy sign noted by sonographer. Common bile duct: Diameter: 16 mm which is increased compared to prior exam. Liver: 3.3 x 1.8 cm complex mass is seen in right hepatic lobe peripherally. Mild intrahepatic biliary dilatation is noted. Within normal limits in parenchymal echogenicity. Portal vein is patent on color Doppler imaging with normal direction of blood flow towards the liver. Other: None. IMPRESSION: Interval development of 3.3 x 1.8 cm complex mass in right hepatic lobe. CT or MRI with intravenous contrast is recommended to evaluate for metastatic disease or other neoplasm. Increased extrahepatic ductal dilatation is noted concerning for distal common bile duct obstruction. Correlation with liver function tests is recommended. Electronically Signed   By: Marijo Conception M.D.   On: 05/26/2021 14:18      ASSESSMENT & PLAN:  1. Encounter for antineoplastic chemotherapy   2. 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 Anemia due to antineoplastic chemotherapy   5. Pancreatic insufficiency   6. 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  Pathology proved residual disease  Labs reviewed and discussed with patient. Proceed with cycle 2-day 1 gemcitabine and Abraxane. Patient has plan on 2/20 to go out of town and she  prefers not to be on chemotherapy that week.  In order not to have a 2-week break of chemo.  For this current cycle, we want to chemotherapy 2 weeks on, 1 week off.  We will do chemotherapy on 08/10/2021, and she will see how she feels to decide whether she wants to cancel her appointment on 08/17/2021.  #Chemotherapy-induced neutropenia, ANC is normal.  Monitor. #Chemotherapy-induced neuropathy of fingertips and toes, grade 2.  Follow-up with neurology.  Not tolerating Cymbalta.  Dose reduced Abraxane, currently neuropathy symptoms are stable.  #pancreatic insufficiency.  Continue Creon, continue 24000 units TID with meals.  Weight is stable.  #Elevated CEA,-s/p colonoscopy  # Chemotherapy induced anemia, stable.  All questions were answered. The patient knows to call the clinic with any problems questions or concerns.  Return of visit: 1 week cycle 2 D8 chemo.  [name], MD, PhD  07/20/2021

## 2021-07-20 NOTE — Patient Instructions (Signed)
[facility]  Discharge Instructions: °Thank you for choosing [facility] to provide your oncology and hematology care.  °If you have a lab appointment with the Cancer Center, please go directly to the Cancer Center and check in at the registration area. ° °Wear comfortable clothing and clothing appropriate for easy access to any Portacath or PICC line.  ° °We strive to give you quality time with your provider. You may need to reschedule your appointment if you arrive late (15 or more minutes).  Arriving late affects you and other patients whose appointments are after yours.  Also, if you miss three or more appointments without notifying the office, you may be dismissed from the clinic at the provider’s discretion.    °  °For prescription refill requests, have your pharmacy contact our office and allow 72 hours for refills to be completed.   ° °Today you received the following chemotherapy and/or immunotherapy agents : Abraxane / Gemzar  °  °To help prevent nausea and vomiting after your treatment, we encourage you to take your nausea medication as directed. ° °BELOW ARE SYMPTOMS THAT SHOULD BE REPORTED IMMEDIATELY: °*FEVER GREATER THAN 100.4 F (38 °C) OR HIGHER °*CHILLS OR SWEATING °*NAUSEA AND VOMITING THAT IS NOT CONTROLLED WITH YOUR NAUSEA MEDICATION °*UNUSUAL SHORTNESS OF BREATH °*UNUSUAL BRUISING OR BLEEDING °*URINARY PROBLEMS (pain or burning when urinating, or frequent urination) °*BOWEL PROBLEMS (unusual diarrhea, constipation, pain near the anus) °TENDERNESS IN MOUTH AND THROAT WITH OR WITHOUT PRESENCE OF ULCERS (sore throat, sores in mouth, or a toothache) °UNUSUAL RASH, SWELLING OR PAIN  °UNUSUAL VAGINAL DISCHARGE OR ITCHING  ° °Items with * indicate a potential emergency and should be followed up as soon as possible or go to the Emergency Department if any problems should occur. ° °Please show the CHEMOTHERAPY ALERT CARD or IMMUNOTHERAPY ALERT CARD at  check-in to the Emergency Department and triage nurse. ° °Should you have questions after your visit or need to cancel or reschedule your appointment, please contact [facility]  [phone] and follow the prompts.  Office hours are 8:00 a.m. to 4:30 p.m. Monday - Friday. Please note that voicemails left after 4:00 p.m. may not be returned until the following business day.  We are closed weekends and major holidays. You have access to a nurse at all times for urgent questions. Please call the main number to the clinic [phone] and follow the prompts. ° °For any non-urgent questions, you may also contact your provider using MyChart. We now offer e-Visits for anyone 18 and older to request care online for non-urgent symptoms. For details visit [email or web address]. °  °Also download the MyChart app! Go to the app store, search "MyChart", open the app, select [facility], and log in with your MyChart username and password. ° °Due to Covid, a mask is required upon entering the hospital/clinic. If you do not have a mask, one will be given to you upon arrival. For doctor visits, patients may have 1 support person aged 18 or older with them. For treatment visits, patients cannot have anyone with them due to current Covid guidelines and our immunocompromised population.  °

## 2021-07-20 NOTE — Progress Notes (Signed)
Pt and husband in for follow up, states she has some question for MD.

## 2021-07-20 NOTE — Progress Notes (Signed)
Ok to proceed with [place] per MD

## 2021-07-21 LAB — CANCER ANTIGEN 19-9: CA 19-9: 7 U/mL (ref 0–35)

## 2021-07-27 ENCOUNTER — Inpatient Hospital Stay: Payer: Medicare Other

## 2021-07-27 ENCOUNTER — Inpatient Hospital Stay (HOSPITAL_BASED_OUTPATIENT_CLINIC_OR_DEPARTMENT_OTHER): Payer: Medicare Other | Admitting: Oncology

## 2021-07-27 ENCOUNTER — Encounter: Payer: Self-pay | Admitting: Oncology

## 2021-07-27 ENCOUNTER — Other Ambulatory Visit: Payer: Self-pay

## 2021-07-27 VITALS — BP 110/75 | HR 63 | Temp 98.3°F | Resp 18 | Wt 138.3 lb

## 2021-07-27 DIAGNOSIS — K8689 Other specified diseases of pancreas: Secondary | ICD-10-CM | POA: Diagnosis not present

## 2021-07-27 DIAGNOSIS — C259 Malignant neoplasm of pancreas, unspecified: Secondary | ICD-10-CM

## 2021-07-27 DIAGNOSIS — C787 Secondary malignant neoplasm of liver and intrahepatic bile duct: Secondary | ICD-10-CM

## 2021-07-27 DIAGNOSIS — T451X5A Adverse effect of antineoplastic and immunosuppressive drugs, initial encounter: Secondary | ICD-10-CM

## 2021-07-27 DIAGNOSIS — Z5111 Encounter for antineoplastic chemotherapy: Secondary | ICD-10-CM

## 2021-07-27 DIAGNOSIS — G620 Drug-induced polyneuropathy: Secondary | ICD-10-CM

## 2021-07-27 DIAGNOSIS — D6481 Anemia due to antineoplastic chemotherapy: Secondary | ICD-10-CM

## 2021-07-27 DIAGNOSIS — D701 Agranulocytosis secondary to cancer chemotherapy: Secondary | ICD-10-CM

## 2021-07-27 LAB — COMPREHENSIVE METABOLIC PANEL
ALT: 30 U/L (ref 0–44)
AST: 29 U/L (ref 15–41)
Albumin: 3.9 g/dL (ref 3.5–5.0)
Alkaline Phosphatase: 117 U/L (ref 38–126)
Anion gap: 9 (ref 5–15)
BUN: 16 mg/dL (ref 8–23)
CO2: 26 mmol/L (ref 22–32)
Calcium: 8.9 mg/dL (ref 8.9–10.3)
Chloride: 101 mmol/L (ref 98–111)
Creatinine, Ser: 0.67 mg/dL (ref 0.44–1.00)
GFR, Estimated: >60 mL/min (ref >60)
Glucose, Bld: 120 mg/dL — ABNORMAL HIGH (ref 70–99)
Potassium: 3.9 mmol/L (ref 3.5–5.1)
Sodium: 136 mmol/L (ref 135–145)
Total Bilirubin: 0.6 mg/dL (ref 0.3–1.2)
Total Protein: 6.4 g/dL — ABNORMAL LOW (ref 6.5–8.1)

## 2021-07-27 LAB — CBC WITH DIFFERENTIAL/PLATELET
Abs Immature Granulocytes: 0.01 10*3/uL (ref 0.00–0.07)
Basophils Absolute: 0 10*3/uL (ref 0.0–0.1)
Basophils Relative: 1 %
Eosinophils Absolute: 0 10*3/uL (ref 0.0–0.5)
Eosinophils Relative: 1 %
HCT: 32.3 % — ABNORMAL LOW (ref 36.0–46.0)
Hemoglobin: 10.8 g/dL — ABNORMAL LOW (ref 12.0–15.0)
Immature Granulocytes: 0 %
Lymphocytes Relative: 39 %
Lymphs Abs: 1.1 10*3/uL (ref 0.7–4.0)
MCH: 32.4 pg (ref 26.0–34.0)
MCHC: 33.4 g/dL (ref 30.0–36.0)
MCV: 97 fL (ref 80.0–100.0)
Monocytes Absolute: 0.2 10*3/uL (ref 0.1–1.0)
Monocytes Relative: 8 %
Neutro Abs: 1.4 10*3/uL — ABNORMAL LOW (ref 1.7–7.7)
Neutrophils Relative %: 51 %
Platelets: 203 10*3/uL (ref 150–400)
RBC: 3.33 MIL/uL — ABNORMAL LOW (ref 3.87–5.11)
RDW: 13.8 % (ref 11.5–15.5)
WBC: 2.9 10*3/uL — ABNORMAL LOW (ref 4.0–10.5)
nRBC: 0 % (ref 0.0–0.2)

## 2021-07-27 MED ORDER — HEPARIN SOD (PORK) LOCK FLUSH 100 UNIT/ML IV SOLN
INTRAVENOUS | Status: AC
  Filled 2021-07-27: qty 5

## 2021-07-27 MED ORDER — SODIUM CHLORIDE 0.9% FLUSH
10.0000 mL | INTRAVENOUS | Status: DC | PRN
  Filled 2021-07-27: qty 10

## 2021-07-27 MED ORDER — SODIUM CHLORIDE 0.9 % IV SOLN
1000.0000 mg/m2 | Freq: Once | INTRAVENOUS | Status: AC
  Administered 2021-07-27: 1748 mg via INTRAVENOUS
  Filled 2021-07-27: qty 26.3

## 2021-07-27 MED ORDER — HEPARIN SOD (PORK) LOCK FLUSH 100 UNIT/ML IV SOLN
500.0000 [IU] | Freq: Once | INTRAVENOUS | Status: AC | PRN
  Administered 2021-07-27: 500 [IU]
  Filled 2021-07-27: qty 5

## 2021-07-27 MED ORDER — PROCHLORPERAZINE MALEATE 10 MG PO TABS
10.0000 mg | ORAL_TABLET | Freq: Once | ORAL | Status: AC
  Administered 2021-07-27: 10 mg via ORAL
  Filled 2021-07-27: qty 1

## 2021-07-27 MED ORDER — SODIUM CHLORIDE 0.9 % IV SOLN
Freq: Once | INTRAVENOUS | Status: AC
  Filled 2021-07-27: qty 250

## 2021-07-27 MED ORDER — PACLITAXEL PROTEIN-BOUND CHEMO INJECTION 100 MG
100.0000 mg/m2 | Freq: Once | INTRAVENOUS | Status: AC
  Administered 2021-07-27: 175 mg via INTRAVENOUS
  Filled 2021-07-27: qty 35

## 2021-07-27 NOTE — Progress Notes (Signed)
Pt here for follow up. Pt reports having occasional indigestion and believes it may be related to treatment. She take Gas-X, which helps relieve symptoms.

## 2021-07-27 NOTE — Progress Notes (Signed)
Pt tolerated all infusions well today with no problems or complications.  Pt left infusion suite stable and ambulatory.

## 2021-07-27 NOTE — Progress Notes (Signed)
Hematology/Oncology follow up note [phone] [phone]   Patient Care Team: [name], MD as PCP - General (Family Medicine) Flinchum, Kelby Aline, FNP (Family Medicine) [name], RN as Oncology Nurse Navigator [name], MD as Consulting Physician (Oncology)  REFERRING PROVIDER: [name] [name], MD  CHIEF COMPLAINTS/REASON FOR VISIT:  Follow up for treatment of pancreatic adenocarcinoma  HISTORY OF PRESENTING ILLNESS:   Cassandra [name] is a  66 y.o.  female with PMH listed below was seen in consultation at the request of  [name], MD  for evaluation of pancreatic adenocarcinoma Patient initially presented with jaundice, transaminitis, bilirubin was 9.9.  CA 19-9 was 1874.  Patient also reports unintentional weight loss. 02/08/2020 MRI abdomen and MRCP with and without contrast was done at [facility] which showed pancreatic head mass measuring up to 3 cm, with marked associated narrowing of the portal vein confluence.  SMA is preserved.  Marked intrahepatic and extrahepatic biliary duct dilatation as well as mild dilatation of the main pancreatic duct. Multiple hepatic masses highly concerning for metastatic disease.  Patient underwent EUS on 02/19/2020, which showed irregular mass identified in the pancreatic head, hypoechoic, measured 55mx33mm, sonographic evidence concerning for invasion into the superior mesenteric artery.  There is no sign of significant abnormality in the main pancreatic duct.  Dilatation of common bile duct which measured up to 16 mm.  Region of celiac artery was visualized and showed no signs of significant abnormality.  No lymphadenopathy.  FNA showed adenocarcinoma.  02/19/2020, ERCP, malignant.  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  The stricture was treated with placement of wall flex metal stent.  [facility] ZPia Mauand  was recommended for 3 drug regimen FOLFIRINOX.  [facility]  Patient was referred to establish care today. She denies any pain.  Since stent placement, skin jaundice has improved.  Itchiness has also improved. Patient was accompanied by her husband today.  She has a family history of breast cancer in sister and paternal aunt, colon cancer paternal grandmother.  #No reportable targetable mutation on NGS 9/14/2021cycle 1 FOLFIRINOX.  Patient received oxaliplatin and about 50% of Irinotecan on day 1 and had experienced neurologic symptoms.  She went to ER and working diagnosis is TIA, and eventually I think this is due to irinotecan side effects -irinotecan-associated dysarthria, lip/tongue numbness.  Adjustment was made for Irinotecan to be  infused over 180 minutes.  Atropine 0.5 mg once prior to the irinotecan. No recurrent symptoms.   # 03/19/2020-03/21/2020 patient was admitted due to sepsis with strep pneumonia bacteremia.  Patient was treated with IV Rocephin.  TEE was done which showed no vegetation.  No PFO or ASD.  Patient was discharged home and he finished full course of 14 days of IV Rocephin on 04/02/2020 per ID recommendation.  Repeat blood culture was also negative.  08/25/20 cycle 10 FOLFIRINOX 09/08/20- present  Starting cycle 11, FOLFIRI, Oxaliplatin discontinued due to neuropathy   #NGS showed no reportable targetable mutation #Genetic testing-[employer] diagnostic testing showed no pathological variants identified. MS stable, TMB 016m/mb, KRAS G12D, SF3B1 K700E, TP53 [id number]  #01/14/2021  [facility], [facility].  # [facility] [[facility] Scrape[facility] 4~5 liver lesions on OSH MRI last year at the time of diagnosis highly suspicious for metastasis. She  not eligible for surgical protocol for metastasis resection given the number of liver metastasis is >3. However given her excellent  and durable response to chemo, surgical resection may be considered pending sustained disease control at 1 year and may require partial hepatectomy first to see if there is viable tumor before proceeding to Whipple resection ]  Patient had a COVID-19 infection in September 2022.  03/23/2021 CT abdomen pelvis No significant change in the ill-defined pancreatic head mass or  associated biliary ductal and pancreatic ductal dilation.  Slight interval enlargement of subcentimeter anterior peripancreatic  lymph node, nonspecific. Attention on follow-up per clinical protocol. Similar enlargement of the ascending thoracic aorta measuring 4.4 cm  03/23/2021 MRI abdomen w and wo  Increasing ill-defined hypoenhancement of the pancreatic head surrounding the common bile duct stent, in keeping with known pancreatic malignancy. Anterior peripancreatic lymph node is better evaluated on CT.  Unchanged position of a common bile duct stent with similar mild diffuse intrahepatic ductal dilation and pneumobilia. No evidence of metastatic disease in the abdomen or pelvis. Partially visualized cystic left adnexal lesion measuring up to 3.9 cm.   04/10/2021, patient underwent liver resection at [facility], by Dr. [name].   Pathology report from [place] was reviewed. Segments 3 partial hepatectomy, negative for viable tumor. Section 5/6 partial hepatectomy part 1 and 2, microscopic foci of residual viable adenocarcinoma, morphologically consistent with history of pancreatic primary.  Parenchymal margin is uninvolved.  #Patient resumed on FOLFIRI on 04/28/2021. She got another cycle on 05/12/2021 #05/26/2021, patient did not get additional chemotherapy due to transaminitis.  Shared decision was made to stop FOLFIRI and switch to gemcitabine Abraxane treatments.  05/26/2021, patient developed transaminitis, AST 763, ALT 691, alkaline phosphatase 364.  Bilirubin 0.7 05/26/2021 stat ultrasound abdomen right upper quadrant showed interval  development of 3.3 x 1.8 cm complex mass in the right hepatic lobe.  Increased extrahepatic ductal dilatation.  Concerning for CBD obstruction. 05/29/2021, MRI abdomen MRCP with and without contrast showed unchanged pancreatic head soft tissue.  Severe intra and extrahepatic biliary ductal dilatation.  Common bile duct stents remain in position however patency is not established.  No evidence of lymphadenopathy or metastatic disease in the abdomen.  With further communication with radiologist, addendum was added that there is internal fluid signal and no appreciable associated contrast enhancement measuring 3.4 x 2.3 cm.  This appearance is generally not consistent with metastasis and is of uncertain significance.  Possibly reflecting hepatic abscess or residual of subcapsular hematoma.  Patient was seen by [facility] Dr. Alwyn Pea team.  He had ERCP done on 06/05/2021, with findings of One stent from the biliary tree was seen in the major papilla. The stent had migrated significantly into the duodenum. This is the cause of stent malfunction. One stent was removed from the biliary tree. Prior biliary sphincterotomy appeared open. - A single severe biliary stricture was found in the lower third of the main bile duct with upstream dilation. The stricture was alignant appearing.- The biliary tree was swept and sludge was found.- One fully covered metal stent was placed into the common bile duct across the stricture.- No pancreatogram performed.  06/12/2021 Gemcitabine and Abraxane.   INTERVAL HISTORY Cassandra [name] is a 66 y.o. female who has above history reviewed by me today presents for pancreatic cancer.  Patient feels more tired after last chemotherapy No nausea vomiting diarrhea.  Denies fever or chills Neuropathy symptoms remain the same.   Review of Systems  Constitutional:  Negative for appetite  change, chills, fatigue, fever and unexpected weight change.  HENT:   Negative for hearing loss and voice change.    Eyes:  Negative for eye problems.  Respiratory:  Negative for chest tightness and cough.   Cardiovascular:  Negative for chest pain.  Gastrointestinal:  Negative for abdominal distention, abdominal pain, blood in stool and nausea.  Endocrine: Negative for hot flashes.  Genitourinary:  Negative for difficulty urinating and frequency.   Musculoskeletal:  Negative for arthralgias.  Skin:  Negative for itching and rash.  Neurological:  Positive for numbness. Negative for extremity weakness.  Hematological:  Negative for adenopathy.  Psychiatric/Behavioral:  Negative for confusion.    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   Neutropenia ([place]) 04/18/2020   Osteopenia after menopause 05/2017   femoral neck T score -2.0   Personal history of chemotherapy    current for pancreatic ca   PONV (postoperative nausea and vomiting)    Pure hypercholesterolemia     SURGICAL HISTORY: Past Surgical History:  Procedure Laterality Date   COLONOSCOPY  07/2015   WNL   COLONOSCOPY  2008/2011   COLONOSCOPY WITH PROPOFOL N/A 07/10/2021   Procedure: COLONOSCOPY WITH PROPOFOL;  Surgeon: [name], MD;  Location: [facility] ENDOSCOPY;  Service: Endoscopy;  Laterality: N/A;   IR CV LINE INJECTION  04/28/2021   OVARIAN CYST REMOVAL  1992   dermoid-Dr CAK   PORTA CATH INSERTION N/A 03/07/2020   Procedure: PORTA CATH INSERTION;  Surgeon: Algernon Huxley, MD;  Location: [place] [facility];  Service: Cardiovascular;  Laterality: N/A;   TEE WITHOUT CARDIOVERSION N/A 03/21/2020   Procedure: TRANSESOPHAGEAL ECHOCARDIOGRAM (TEE);  Surgeon: Dionisio David, MD;  Location: [facility] ORS;  Service: Cardiovascular;  Laterality: N/A;   TUBAL LIGATION  1993    SOCIAL HISTORY: Social History   Socioeconomic History   Marital status: Married    Spouse  name: Not on file   Number of children: 2   Years of education: Not on file   Highest education level: Not on file  Occupational History   Occupation: Pharmacist, hospital    Comment: retired   Occupation: Psychologist, sport and exercise  Tobacco Use   Smoking status: Former    Packs/day: 1.00    Years: 12.00    Pack years: 12.00    Types: Cigarettes    Quit date: 1987    Years since quitting: 36.1   Smokeless tobacco: Never   Tobacco comments:    Quit smoking 1987  Vaping Use   Vaping Use: Never used  Substance and Sexual Activity   Alcohol use: Not Currently    Alcohol/week: 7.0 standard drinks    Types: 7 Glasses of wine per week    Comment: 0-2 mixed drinks a day   Drug use: No   Sexual activity: Not Currently    Birth control/protection: Post-menopausal  Other Topics Concern   Not on file  Social History Narrative   Not on file   Social Determinants of Health   Financial Resource Strain: Not on file  Food Insecurity: Not on file  Transportation Needs: Not on file  Physical Activity: Not on file  Stress: Not on file  Social Connections: Not on file  Intimate Partner Violence: Not on file    FAMILY HISTORY:  Family History  Problem Relation Age of Onset   Breast cancer Paternal Aunt 76   Diabetes Mother    Osteoporosis Mother    Hyperlipidemia Father    Rheumatic fever Father    Valvular heart disease Father    Cancer Paternal Grandmother        possible colon   Breast cancer Sister 28    ALLERGIES:  is allergic to penicillin g.  MEDICATIONS:  Current Outpatient Medications  Medication Sig Dispense Refill   Calcium Carbonate-Vit D-Min (CALCIUM 1200 PO) Take by mouth.     Cholecalciferol 25 MCG (1000 UT) tablet Take 2,000 Units by mouth daily.     CREON 24000-76000 units CPEP TAKE 1 CAPSULE (24,000 UNITS TOTAL) BY MOUTH 3 (THREE) TIMES DAILY BEFORE MEALS. 200 capsule 3   Cyanocobalamin (VITAMIN B 12 PO) Take by mouth.     Multiple Vitamins-Minerals (MULTIVITAMIN WITH MINERALS) tablet  Take 1 tablet by mouth daily.     ondansetron (ZOFRAN-ODT) 4 MG disintegrating tablet Take 4 mg by mouth every 8 (eight) hours as needed.     simethicone (GAS-X) 80 MG chewable tablet Chew 1 tablet (80 mg total) by mouth every 8 (eight) hours as needed for flatulence. 60 tablet 0   gabapentin (NEURONTIN) 100 MG capsule Take 1 capsule (100 mg total) by mouth at bedtime. (Patient not taking: Reported on 07/20/2021) 30 capsule 1   magic mouthwash w/lidocaine SOLN Take 5 mLs by mouth 4 (four) times daily as needed for mouth pain. Sig: Swish & Spit 5-10 ml four times a day as needed. Dispense 480 ml. 1RF (Patient not taking: Reported on 12/17/2020) 480 mL 1   No current facility-administered medications for this visit.   Facility-Administered Medications Ordered in Other Visits  Medication Dose Route Frequency Provider Last Rate Last Admin   sodium chloride flush (NS) 0.9 % injection 10 mL  10 mL Intravenous PRN [name], MD   10 mL at 02/18/21 0858   sodium chloride flush (NS) 0.9 % injection 10 mL  10 mL Intravenous Once Borders, Vonna Kotyk R, NP       sodium chloride flush (NS) 0.9 % injection 10 mL  10 mL Intravenous Once [name], MD         PHYSICAL EXAMINATION: ECOG PERFORMANCE STATUS: 0 - Asymptomatic Vitals:   07/27/21 0909  BP: 110/75  Pulse: 63  Resp: 18  Temp: 98.3 F (36.8 C)   Filed Weights   07/27/21 0909  Weight: 138 lb 4.8 oz (62.7 kg)    Physical Exam Constitutional:      General: She is not in acute distress. HENT:     Head: Normocephalic and atraumatic.  Eyes:     General: No scleral icterus. Cardiovascular:     Rate and Rhythm: Normal rate and regular rhythm.     Heart sounds: Normal heart sounds.  Pulmonary:     Effort: Pulmonary effort is normal. No respiratory distress.     Breath sounds: No wheezing.  Abdominal:     General: Bowel sounds are normal. There is no distension.     Palpations: Abdomen is soft.  Musculoskeletal:        General: No deformity.  Normal range of motion.     Cervical back: Normal range of motion and neck supple.  Skin:    General: Skin is warm and dry.     Coloration: Skin is not jaundiced.     Findings: No erythema or rash.  Neurological:  Mental Status: She is alert and oriented to person, place, and time. Mental status is at baseline.     Cranial Nerves: No cranial nerve deficit.     Coordination: Coordination normal.  Psychiatric:        Mood and Affect: Mood normal.    LABORATORY DATA:  I have reviewed the data as listed Lab Results  Component Value Date   WBC 2.9 (L) 07/27/2021   HGB 10.8 (L) 07/27/2021   HCT 32.3 (L) 07/27/2021   MCV 97.0 07/27/2021   PLT 203 07/27/2021   Recent Labs    07/03/21 0818 07/20/21 0813 07/27/21 0848  NA 137 141 136  K 4.5 4.3 3.9  CL 102 107 101  CO2 $Re'28 25 26  'IKA$ GLUCOSE 104* 97 120*  BUN $Re'17 17 16  'Bva$ CREATININE 0.68 0.72 0.67  CALCIUM 8.8* 8.9 8.9  GFRNONAA >60 >60 >60  PROT 6.4* 6.1* 6.4*  ALBUMIN 3.7 3.8 3.9  AST 32 28 29  ALT 49* 25 30  ALKPHOS 187* 116 117  BILITOT 0.4 0.3 0.6    Iron/TIBC/Ferritin/ %Sat No results found for: IRON, TIBC, FERRITIN, IRONPCTSAT    RADIOGRAPHIC STUDIES: I have personally reviewed the radiological images as listed and agreed with the findings in the report. Reviewed findings of MRI abdomen MRCP done at [facility] MR 3D Recon At Scanner  Addendum Date: 05/29/2021   ADDENDUM REPORT: 05/29/2021 08:43 ADDENDUM: There is a subcapsular, nonenhancing lesion of the inferior tip of the right lobe of the liver, hepatic segment VII, adjacent to the gallbladder fossa, with internal fluid signal and no appreciable associated contrast enhancement measuring 3.4 x 2.3 cm. This appearance is generally not consistent with metastasis and is of uncertain significance, possibly reflecting a hepatic abscess in the setting of biliary obstruction or residua of a subcapsular hematoma. Findings discussed with Dr. Tasia Catchings on 05/27/2021 and 05/29/2021.  Electronically Signed   By: Delanna Ahmadi M.D.   On: 05/29/2021 08:43   Result Date: 05/29/2021 CLINICAL DATA:  Pancreatic cancer, elevated LFTs EXAM: MRI ABDOMEN WITHOUT AND WITH CONTRAST (INCLUDING MRCP) TECHNIQUE: Multiplanar multisequence MR imaging of the abdomen was performed both before and after the administration of intravenous contrast. Heavily T2-weighted images of the biliary and pancreatic ducts were obtained, and three-dimensional MRCP images were rendered by post processing. CONTRAST:  75mL GADAVIST GADOBUTROL 1 MMOL/ML IV SOLN COMPARISON:  CT chest abdomen pelvis, 10/31/2020 FINDINGS: Lower chest: No acute findings. Hepatobiliary: Severe intra and extrahepatic biliary ductal dilatation, increased compared to prior CT. The common bile duct measures 1.8 cm, previously 1.1 cm. Common bile duct stent remains in position. Portal veins are patent. Pancreas: Unchanged post treatment appearance of pancreatic head soft tissue, although ill-defined measuring approximately 2.9 x 2.6 cm (series 14, image 40). Severe atrophy of the pancreatic parenchyma distally with prominence of the pancreatic duct. Spleen:  Within normal limits in size and appearance. Adrenals/Urinary Tract: No masses identified. No evidence of hydronephrosis. Stomach/Bowel: Visualized portions within the abdomen are unremarkable. Vascular/Lymphatic: No pathologically enlarged lymph nodes identified. No abdominal aortic aneurysm demonstrated. Other:  None. Musculoskeletal: No suspicious bone lesions identified. IMPRESSION: 1. Unchanged post treatment appearance of pancreatic head soft tissue, in keeping with primary pancreatic malignancy. 2. Severe intra and extrahepatic biliary ductal dilatation, increased compared to prior CT. Common bile duct stent remains in position, however patency is not established on this MR examination. 3. No evidence of lymphadenopathy or metastatic disease in the abdomen. Electronically Signed: By: Jamse Mead.D.  On: 05/27/2021 13:31   MR ABDOMEN MRCP W WO CONTAST  Addendum Date: 05/29/2021   ADDENDUM REPORT: 05/29/2021 08:43 ADDENDUM: There is a subcapsular, nonenhancing lesion of the inferior tip of the right lobe of the liver, hepatic segment VII, adjacent to the gallbladder fossa, with internal fluid signal and no appreciable associated contrast enhancement measuring 3.4 x 2.3 cm. This appearance is generally not consistent with metastasis and is of uncertain significance, possibly reflecting a hepatic abscess in the setting of biliary obstruction or residua of a subcapsular hematoma. Findings discussed with Dr. Tasia Catchings on 05/27/2021 and 05/29/2021. Electronically Signed   By: Delanna Ahmadi M.D.   On: 05/29/2021 08:43   Result Date: 05/29/2021 CLINICAL DATA:  Pancreatic cancer, elevated LFTs EXAM: MRI ABDOMEN WITHOUT AND WITH CONTRAST (INCLUDING MRCP) TECHNIQUE: Multiplanar multisequence MR imaging of the abdomen was performed both before and after the administration of intravenous contrast. Heavily T2-weighted images of the biliary and pancreatic ducts were obtained, and three-dimensional MRCP images were rendered by post processing. CONTRAST:  69mL GADAVIST GADOBUTROL 1 MMOL/ML IV SOLN COMPARISON:  CT chest abdomen pelvis, 10/31/2020 FINDINGS: Lower chest: No acute findings. Hepatobiliary: Severe intra and extrahepatic biliary ductal dilatation, increased compared to prior CT. The common bile duct measures 1.8 cm, previously 1.1 cm. Common bile duct stent remains in position. Portal veins are patent. Pancreas: Unchanged post treatment appearance of pancreatic head soft tissue, although ill-defined measuring approximately 2.9 x 2.6 cm (series 14, image 40). Severe atrophy of the pancreatic parenchyma distally with prominence of the pancreatic duct. Spleen:  Within normal limits in size and appearance. Adrenals/Urinary Tract: No masses identified. No evidence of hydronephrosis. Stomach/Bowel: Visualized portions  within the abdomen are unremarkable. Vascular/Lymphatic: No pathologically enlarged lymph nodes identified. No abdominal aortic aneurysm demonstrated. Other:  None. Musculoskeletal: No suspicious bone lesions identified. IMPRESSION: 1. Unchanged post treatment appearance of pancreatic head soft tissue, in keeping with primary pancreatic malignancy. 2. Severe intra and extrahepatic biliary ductal dilatation, increased compared to prior CT. Common bile duct stent remains in position, however patency is not established on this MR examination. 3. No evidence of lymphadenopathy or metastatic disease in the abdomen. Electronically Signed: By: Delanna Ahmadi M.D. On: 05/27/2021 13:31   US Abdomen Limited RUQ (LIVER/GB)  Result Date: 05/26/2021 CLINICAL DATA:  Abnormal liver enzymes. EXAM: ULTRASOUND ABDOMEN LIMITED RIGHT UPPER QUADRANT COMPARISON:  March 19, 2021.  Oct 30, 2020. FINDINGS: Gallbladder: No gallstones or wall thickening visualized. No sonographic Murphy sign noted by sonographer. Common bile duct: Diameter: 16 mm which is increased compared to prior exam. Liver: 3.3 x 1.8 cm complex mass is seen in right hepatic lobe peripherally. Mild intrahepatic biliary dilatation is noted. Within normal limits in parenchymal echogenicity. Portal vein is patent on color Doppler imaging with normal direction of blood flow towards the liver. Other: None. IMPRESSION: Interval development of 3.3 x 1.8 cm complex mass in right hepatic lobe. CT or MRI with intravenous contrast is recommended to evaluate for metastatic disease or other neoplasm. Increased extrahepatic ductal dilatation is noted concerning for distal common bile duct obstruction. Correlation with liver function tests is recommended. Electronically Signed   By: Marijo Conception M.D.   On: 05/26/2021 14:18      ASSESSMENT & PLAN:  1. Primary pancreatic cancer ([place])   2. 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 Pancreatic insufficiency   5. Anemia due to antineoplastic chemotherapy   6. 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  Pathology proved residual disease  Labs are reviewed and discussed with patient. Proceed with cycle 2-day 8 gemcitabine and Abraxane.  She will have next week off due to plans of going out of town on 08/17/2021.  She will come back on 08/10/2021 for evaluation of next cycle of treatment.  #Chemotherapy-induced neutropenia, ANC has decreased.  Close monitor. #Chemotherapy-induced neuropathy of fingertips and toes, grade 2.  Follow-up with neurology.  Not tolerating Cymbalta.  Dose reduced Abraxane, stable neuropathy symptoms.  #pancreatic insufficiency.  Continue Creon, continue 24000 units TID with meals.  Weight is stable.  #Elevated CEA,-s/p colonoscopy  # Chemotherapy induced anemia, hemoglobin further decreased to 10.8.  Anticipated.  All questions were answered. The patient knows to call the clinic with any problems questions or concerns.  Return of visit: 2 weeks for cycle 3-day 1 gemcitabine and Abraxane. [name], MD, PhD  07/27/2021

## 2021-07-27 NOTE — Patient Instructions (Signed)
[facility] [place]  Discharge Instructions: Thank you for choosing [facility] to provide your oncology and hematology care.  If you have a lab appointment with the [facility], please go directly to the [facility] and check in at the registration area.  Wear comfortable clothing and clothing appropriate for easy access to any Portacath or PICC line.   We strive to give you quality time with your provider. You may need to reschedule your appointment if you arrive late (15 or more minutes).  Arriving late affects you and other patients whose appointments are after yours.  Also, if you miss three or more appointments without notifying the office, you may be dismissed from the clinic at the providers discretion.      For prescription refill requests, have your pharmacy contact our office and allow 72 hours for refills to be completed.    Today you received the following chemotherapy and/or immunotherapy agents abraxane, gemzar      To help prevent nausea and vomiting after your treatment, we encourage you to take your nausea medication as directed.  BELOW ARE SYMPTOMS THAT SHOULD BE REPORTED IMMEDIATELY: *FEVER GREATER THAN 100.4 F (38 C) OR HIGHER *CHILLS OR SWEATING *NAUSEA AND VOMITING THAT IS NOT CONTROLLED WITH YOUR NAUSEA MEDICATION *UNUSUAL SHORTNESS OF BREATH *UNUSUAL BRUISING OR BLEEDING *URINARY PROBLEMS (pain or burning when urinating, or frequent urination) *BOWEL PROBLEMS (unusual diarrhea, constipation, pain near the anus) TENDERNESS IN MOUTH AND THROAT WITH OR WITHOUT PRESENCE OF ULCERS (sore throat, sores in mouth, or a toothache) UNUSUAL RASH, SWELLING OR PAIN  UNUSUAL VAGINAL DISCHARGE OR ITCHING   Items with * indicate a potential emergency and should be followed up as soon as possible or go to the Emergency Department if any problems should occur.  Please show the CHEMOTHERAPY ALERT CARD or IMMUNOTHERAPY ALERT CARD at  check-in to the Emergency Department and triage nurse.  Should you have questions after your visit or need to cancel or reschedule your appointment, please contact [facility] [place] AT [place]  [phone] and follow the prompts.  Office hours are 8:00 a.m. to 4:30 p.m. Monday - Friday. Please note that voicemails left after 4:00 p.m. may not be returned until the following business day.  We are closed weekends and major holidays. You have access to a nurse at all times for urgent questions. Please call the main number to the clinic [phone] and follow the prompts.  For any non-urgent questions, you may also contact your provider using MyChart. We now offer e-Visits for anyone 28 and older to request care online for non-urgent symptoms. For details visit [email or web address].   Also download the MyChart app! Go to the app store, search "MyChart", open the app, select [facility], and log in with your MyChart username and password.  Due to Covid, a mask is required upon entering the hospital/clinic. If you do not have a mask, one will be given to you upon arrival. For doctor visits, patients may have 1 support person aged 47 or older with them. For treatment visits, patients cannot have anyone with them due to current Covid guidelines and our immunocompromised population.   Gemcitabine injection What is this medication? GEMCITABINE (jem SYE ta been) is a chemotherapy drug. This medicine is used to treat many types of cancer like breast cancer, lung cancer, pancreatic cancer, and ovarian cancer. This medicine may be used for other purposes; ask your health care provider or pharmacist if you have questions.  COMMON BRAND NAME(S): Gemzar, Infugem What should I tell my care team before I take this medication? They need to know if you have any of these conditions: blood disorders infection kidney disease liver disease lung or breathing disease, like asthma recent or ongoing  radiation therapy an unusual or allergic reaction to gemcitabine, other chemotherapy, other medicines, foods, dyes, or preservatives pregnant or trying to get pregnant breast-feeding How should I use this medication? This drug is given as an infusion into a vein. It is administered in a hospital or clinic by a specially trained health care professional. Talk to your pediatrician regarding the use of this medicine in children. Special care may be needed. Overdosage: If you think you have taken too much of this medicine contact a poison control center or emergency room at once. NOTE: This medicine is only for you. Do not share this medicine with others. What if I miss a dose? It is important not to miss your dose. Call your doctor or health care professional if you are unable to keep an appointment. What may interact with this medication? medicines to increase blood counts like filgrastim, pegfilgrastim, sargramostim some other chemotherapy drugs like cisplatin vaccines Talk to your doctor or health care professional before taking any of these medicines: acetaminophen aspirin ibuprofen ketoprofen naproxen This list may not describe all possible interactions. Give your health care provider a list of all the medicines, herbs, non-prescription drugs, or dietary supplements you use. Also tell them if you smoke, drink alcohol, or use illegal drugs. Some items may interact with your medicine. What should I watch for while using this medication? Visit your doctor for checks on your progress. This drug may make you feel generally unwell. This is not uncommon, as chemotherapy can affect healthy cells as well as cancer cells. Report any side effects. Continue your course of treatment even though you feel ill unless your doctor tells you to stop. In some cases, you may be given additional medicines to help with side effects. Follow all directions for their use. Call your doctor or health care  professional for advice if you get a fever, chills or sore throat, or other symptoms of a cold or flu. Do not treat yourself. This drug decreases your body's ability to fight infections. Try to avoid being around people who are sick. This medicine may increase your risk to bruise or bleed. Call your doctor or health care professional if you notice any unusual bleeding. Be careful brushing and flossing your teeth or using a toothpick because you may get an infection or bleed more easily. If you have any dental work done, tell your dentist you are receiving this medicine. Avoid taking products that contain aspirin, acetaminophen, ibuprofen, naproxen, or ketoprofen unless instructed by your doctor. These medicines may hide a fever. Do not become pregnant while taking this medicine or for 6 months after stopping it. Women should inform their doctor if they wish to become pregnant or think they might be pregnant. Men should not father a child while taking this medicine and for 3 months after stopping it. There is a potential for serious side effects to an unborn child. Talk to your health care professional or pharmacist for more information. Do not breast-feed an infant while taking this medicine or for at least 1 week after stopping it. Men should inform their doctors if they wish to father a child. This medicine may lower sperm counts. Talk with your doctor or health care professional if you are concerned  about your fertility. What side effects may I notice from receiving this medication? Side effects that you should report to your doctor or health care professional as soon as possible: allergic reactions like skin rash, itching or hives, swelling of the face, lips, or tongue breathing problems pain, redness, or irritation at site where injected signs and symptoms of a dangerous change in heartbeat or heart rhythm like chest pain; dizziness; fast or irregular heartbeat; palpitations; feeling faint or  lightheaded, falls; breathing problems signs of decreased platelets or bleeding - bruising, pinpoint red spots on the skin, black, tarry stools, blood in the urine signs of decreased red blood cells - unusually weak or tired, feeling faint or lightheaded, falls signs of infection - fever or chills, cough, sore throat, pain or difficulty passing urine signs and symptoms of kidney injury like trouble passing urine or change in the amount of urine signs and symptoms of liver injury like dark yellow or brown urine; general ill feeling or flu-like symptoms; light-colored stools; loss of appetite; nausea; right upper belly pain; unusually weak or tired; yellowing of the eyes or skin swelling of ankles, feet, hands Side effects that usually do not require medical attention (report to your doctor or health care professional if they continue or are bothersome): constipation diarrhea hair loss loss of appetite nausea rash vomiting This list may not describe all possible side effects. Call your doctor for medical advice about side effects. You may report side effects to FDA at 1-800-FDA-1088. Where should I keep my medication? This drug is given in a hospital or clinic and will not be stored at home. NOTE: This sheet is a summary. It may not cover all possible information. If you have questions about this medicine, talk to your doctor, pharmacist, or health care provider.  2022 Elsevier/Gold Standard (2017-09-07 00:00:00)   Nanoparticle Albumin-Bound Paclitaxel injection What is this medication? NANOPARTICLE ALBUMIN-BOUND PACLITAXEL (Na no PAHR ti kuhl  al BYOO muhn-bound  PAK li TAX el) is a chemotherapy drug. It targets fast dividing cells, like cancer cells, and causes these cells to die. This medicine is used to treat advanced breast cancer, lung cancer, and pancreatic cancer. This medicine may be used for other purposes; ask your health care provider or pharmacist if you have questions. COMMON  BRAND NAME(S): Abraxane What should I tell my care team before I take this medication? They need to know if you have any of these conditions: kidney disease liver disease low blood counts, like low white cell, platelet, or red cell counts lung or breathing disease, like asthma tingling of the fingers or toes, or other nerve disorder an unusual or allergic reaction to paclitaxel, albumin, other chemotherapy, other medicines, foods, dyes, or preservatives pregnant or trying to get pregnant breast-feeding How should I use this medication? This drug is given as an infusion into a vein. It is administered in a hospital or clinic by a specially trained health care professional. Talk to your pediatrician regarding the use of this medicine in children. Special care may be needed. Overdosage: If you think you have taken too much of this medicine contact a poison control center or emergency room at once. NOTE: This medicine is only for you. Do not share this medicine with others. What if I miss a dose? It is important not to miss your dose. Call your doctor or health care professional if you are unable to keep an appointment. What may interact with this medication? This medicine may interact with the following  medications: antiviral medicines for hepatitis, HIV or AIDS certain antibiotics like erythromycin and clarithromycin certain medicines for fungal infections like ketoconazole and itraconazole certain medicines for seizures like carbamazepine, phenobarbital, phenytoin gemfibrozil nefazodone rifampin St. John's wort This list may not describe all possible interactions. Give your health care provider a list of all the medicines, herbs, non-prescription drugs, or dietary supplements you use. Also tell them if you smoke, drink alcohol, or use illegal drugs. Some items may interact with your medicine. What should I watch for while using this medication? Your condition will be monitored carefully  while you are receiving this medicine. You will need important blood work done while you are taking this medicine. This medicine can cause serious allergic reactions. If you experience allergic reactions like skin rash, itching or hives, swelling of the face, lips, or tongue, tell your doctor or health care professional right away. In some cases, you may be given additional medicines to help with side effects. Follow all directions for their use. This drug may make you feel generally unwell. This is not uncommon, as chemotherapy can affect healthy cells as well as cancer cells. Report any side effects. Continue your course of treatment even though you feel ill unless your doctor tells you to stop. Call your doctor or health care professional for advice if you get a fever, chills or sore throat, or other symptoms of a cold or flu. Do not treat yourself. This drug decreases your body's ability to fight infections. Try to avoid being around people who are sick. This medicine may increase your risk to bruise or bleed. Call your doctor or health care professional if you notice any unusual bleeding. Be careful brushing and flossing your teeth or using a toothpick because you may get an infection or bleed more easily. If you have any dental work done, tell your dentist you are receiving this medicine. Avoid taking products that contain aspirin, acetaminophen, ibuprofen, naproxen, or ketoprofen unless instructed by your doctor. These medicines may hide a fever. Do not become pregnant while taking this medicine or for 6 months after stopping it. Women should inform their doctor if they wish to become pregnant or think they might be pregnant. Men should not father a child while taking this medicine or for 3 months after stopping it. There is a potential for serious side effects to an unborn child. Talk to your health care professional or pharmacist for more information. Do not breast-feed an infant while taking this  medicine or for 2 weeks after stopping it. This medicine may interfere with the ability to get pregnant or to father a child. You should talk to your doctor or health care professional if you are concerned about your fertility. What side effects may I notice from receiving this medication? Side effects that you should report to your doctor or health care professional as soon as possible: allergic reactions like skin rash, itching or hives, swelling of the face, lips, or tongue breathing problems changes in vision fast, irregular heartbeat low blood pressure mouth sores pain, tingling, numbness in the hands or feet signs of decreased platelets or bleeding - bruising, pinpoint red spots on the skin, black, tarry stools, blood in the urine signs of decreased red blood cells - unusually weak or tired, feeling faint or lightheaded, falls signs of infection - fever or chills, cough, sore throat, pain or difficulty passing urine signs and symptoms of liver injury like dark yellow or brown urine; general ill feeling or flu-like symptoms; light-colored  stools; loss of appetite; nausea; right upper belly pain; unusually weak or tired; yellowing of the eyes or skin swelling of the ankles, feet, hands unusually slow heartbeat Side effects that usually do not require medical attention (report to your doctor or health care professional if they continue or are bothersome): diarrhea hair loss loss of appetite nausea, vomiting tiredness This list may not describe all possible side effects. Call your doctor for medical advice about side effects. You may report side effects to FDA at 1-800-FDA-1088. Where should I keep my medication? This drug is given in a hospital or clinic and will not be stored at home. NOTE: This sheet is a summary. It may not cover all possible information. If you have questions about this medicine, talk to your doctor, pharmacist, or health care provider.  2022 [employer]vier/Gold Standard  (2017-02-15 00:00:00)

## 2021-07-28 LAB — CANCER ANTIGEN 19-9: CA 19-9: 7 U/mL (ref 0–35)

## 2021-07-30 ENCOUNTER — Telehealth: Payer: Self-pay | Admitting: *Deleted

## 2021-07-30 NOTE — Telephone Encounter (Signed)
Patient called wanting it documented that she has side effects from her treatment but is fine today. She reports that she had muscle spasms in he low back and joint pain and that she felt tired. She reports temp went up to 99.5. She states she slept well last night and feels good now

## 2021-08-10 ENCOUNTER — Other Ambulatory Visit: Payer: Self-pay

## 2021-08-10 ENCOUNTER — Inpatient Hospital Stay: Payer: Medicare Other | Attending: Oncology

## 2021-08-10 ENCOUNTER — Inpatient Hospital Stay (HOSPITAL_BASED_OUTPATIENT_CLINIC_OR_DEPARTMENT_OTHER): Payer: Medicare Other | Admitting: Oncology

## 2021-08-10 ENCOUNTER — Inpatient Hospital Stay: Payer: Medicare Other

## 2021-08-10 ENCOUNTER — Encounter: Payer: Self-pay | Admitting: Oncology

## 2021-08-10 VITALS — BP 116/74 | HR 62 | Temp 97.8°F | Resp 18 | Wt 139.8 lb

## 2021-08-10 DIAGNOSIS — T451X5A Adverse effect of antineoplastic and immunosuppressive drugs, initial encounter: Secondary | ICD-10-CM

## 2021-08-10 DIAGNOSIS — D6481 Anemia due to antineoplastic chemotherapy: Secondary | ICD-10-CM

## 2021-08-10 DIAGNOSIS — K8689 Other specified diseases of pancreas: Secondary | ICD-10-CM

## 2021-08-10 DIAGNOSIS — Z5111 Encounter for antineoplastic chemotherapy: Secondary | ICD-10-CM | POA: Diagnosis not present

## 2021-08-10 DIAGNOSIS — C259 Malignant neoplasm of pancreas, unspecified: Secondary | ICD-10-CM

## 2021-08-10 DIAGNOSIS — C250 Malignant neoplasm of head of pancreas: Secondary | ICD-10-CM | POA: Insufficient documentation

## 2021-08-10 DIAGNOSIS — C787 Secondary malignant neoplasm of liver and intrahepatic bile duct: Secondary | ICD-10-CM | POA: Insufficient documentation

## 2021-08-10 DIAGNOSIS — G620 Drug-induced polyneuropathy: Secondary | ICD-10-CM

## 2021-08-10 LAB — CBC WITH DIFFERENTIAL/PLATELET
Abs Immature Granulocytes: 0.03 10*3/uL (ref 0.00–0.07)
Basophils Absolute: 0 10*3/uL (ref 0.0–0.1)
Basophils Relative: 1 %
Eosinophils Absolute: 0.1 10*3/uL (ref 0.0–0.5)
Eosinophils Relative: 2 %
HCT: 33.3 % — ABNORMAL LOW (ref 36.0–46.0)
Hemoglobin: 11.1 g/dL — ABNORMAL LOW (ref 12.0–15.0)
Immature Granulocytes: 1 %
Lymphocytes Relative: 27 %
Lymphs Abs: 1.3 10*3/uL (ref 0.7–4.0)
MCH: 32.7 pg (ref 26.0–34.0)
MCHC: 33.3 g/dL (ref 30.0–36.0)
MCV: 98.2 fL (ref 80.0–100.0)
Monocytes Absolute: 0.5 10*3/uL (ref 0.1–1.0)
Monocytes Relative: 10 %
Neutro Abs: 3 10*3/uL (ref 1.7–7.7)
Neutrophils Relative %: 59 %
Platelets: 257 10*3/uL (ref 150–400)
RBC: 3.39 MIL/uL — ABNORMAL LOW (ref 3.87–5.11)
RDW: 14.6 % (ref 11.5–15.5)
WBC: 5 10*3/uL (ref 4.0–10.5)
nRBC: 0 % (ref 0.0–0.2)

## 2021-08-10 LAB — COMPREHENSIVE METABOLIC PANEL
ALT: 41 U/L (ref 0–44)
AST: 52 U/L — ABNORMAL HIGH (ref 15–41)
Albumin: 3.7 g/dL (ref 3.5–5.0)
Alkaline Phosphatase: 116 U/L (ref 38–126)
Anion gap: 4 — ABNORMAL LOW (ref 5–15)
BUN: 16 mg/dL (ref 8–23)
CO2: 27 mmol/L (ref 22–32)
Calcium: 8.4 mg/dL — ABNORMAL LOW (ref 8.9–10.3)
Chloride: 106 mmol/L (ref 98–111)
Creatinine, Ser: 0.74 mg/dL (ref 0.44–1.00)
GFR, Estimated: >60 mL/min (ref >60)
Glucose, Bld: 126 mg/dL — ABNORMAL HIGH (ref 70–99)
Potassium: 4.2 mmol/L (ref 3.5–5.1)
Sodium: 137 mmol/L (ref 135–145)
Total Bilirubin: <0.1 mg/dL — ABNORMAL LOW (ref 0.3–1.2)
Total Protein: 6.4 g/dL — ABNORMAL LOW (ref 6.5–8.1)

## 2021-08-10 MED ORDER — PACLITAXEL PROTEIN-BOUND CHEMO INJECTION 100 MG
100.0000 mg/m2 | Freq: Once | INTRAVENOUS | Status: AC
  Administered 2021-08-10: 175 mg via INTRAVENOUS
  Filled 2021-08-10: qty 35

## 2021-08-10 MED ORDER — SODIUM CHLORIDE 0.9 % IV SOLN
1000.0000 mg/m2 | Freq: Once | INTRAVENOUS | Status: AC
  Administered 2021-08-10: 1748 mg via INTRAVENOUS
  Filled 2021-08-10: qty 26.3

## 2021-08-10 MED ORDER — SODIUM CHLORIDE 0.9 % IV SOLN
Freq: Once | INTRAVENOUS | Status: AC
  Filled 2021-08-10: qty 250

## 2021-08-10 MED ORDER — PROCHLORPERAZINE MALEATE 10 MG PO TABS
10.0000 mg | ORAL_TABLET | Freq: Once | ORAL | Status: AC
  Administered 2021-08-10: 10 mg via ORAL
  Filled 2021-08-10: qty 1

## 2021-08-10 MED ORDER — HEPARIN SOD (PORK) LOCK FLUSH 100 UNIT/ML IV SOLN
500.0000 [IU] | Freq: Once | INTRAVENOUS | Status: AC | PRN
  Filled 2021-08-10: qty 5

## 2021-08-10 MED ORDER — HEPARIN SOD (PORK) LOCK FLUSH 100 UNIT/ML IV SOLN
INTRAVENOUS | Status: AC
  Administered 2021-08-10: 500 [IU]
  Filled 2021-08-10: qty 5

## 2021-08-10 NOTE — Progress Notes (Signed)
Pt here for follow up. Pt reports having back pain.

## 2021-08-10 NOTE — Progress Notes (Signed)
Hematology/Oncology follow up note [phone] [phone]   Patient Care Team: [name], MD as PCP - General (Family Medicine) Flinchum, Kelby Aline, FNP (Family Medicine) [name], RN as Oncology Nurse Navigator [name], MD as Consulting Physician (Oncology)  REFERRING PROVIDER: [name] [name], MD  CHIEF COMPLAINTS/REASON FOR VISIT:  Follow up for treatment of pancreatic adenocarcinoma  HISTORY OF PRESENTING ILLNESS:   Cassandra [name] is a  66 y.o.  female with PMH listed below was seen in consultation at the request of  [name], MD  for evaluation of pancreatic adenocarcinoma Patient initially presented with jaundice, transaminitis, bilirubin was 9.9.  CA 19-9 was 1874.  Patient also reports unintentional weight loss. 02/08/2020 MRI abdomen and MRCP with and without contrast was done at [facility] which showed pancreatic head mass measuring up to 3 cm, with marked associated narrowing of the portal vein confluence.  SMA is preserved.  Marked intrahepatic and extrahepatic biliary duct dilatation as well as mild dilatation of the main pancreatic duct. Multiple hepatic masses highly concerning for metastatic disease.  Patient underwent EUS on 02/19/2020, which showed irregular mass identified in the pancreatic head, hypoechoic, measured 55mx33mm, sonographic evidence concerning for invasion into the superior mesenteric artery.  There is no sign of significant abnormality in the main pancreatic duct.  Dilatation of common bile duct which measured up to 16 mm.  Region of celiac artery was visualized and showed no signs of significant abnormality.  No lymphadenopathy.  FNA showed adenocarcinoma.  02/19/2020, ERCP, malignant.  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  The stricture was treated with placement of wall flex metal stent.  [facility] ZPia Mauand  was recommended for 3 drug regimen FOLFIRINOX.  [facility]  Patient was referred to establish care today. She denies any pain.  Since stent placement, skin jaundice has improved.  Itchiness has also improved. Patient was accompanied by her husband today.  She has a family history of breast cancer in sister and paternal aunt, colon cancer paternal grandmother.  #No reportable targetable mutation on NGS 9/14/2021cycle 1 FOLFIRINOX.  Patient received oxaliplatin and about 50% of Irinotecan on day 1 and had experienced neurologic symptoms.  She went to ER and working diagnosis is TIA, and eventually I think this is due to irinotecan side effects -irinotecan-associated dysarthria, lip/tongue numbness.  Adjustment was made for Irinotecan to be  infused over 180 minutes.  Atropine 0.5 mg once prior to the irinotecan. No recurrent symptoms.   # 03/19/2020-03/21/2020 patient was admitted due to sepsis with strep pneumonia bacteremia.  Patient was treated with IV Rocephin.  TEE was done which showed no vegetation.  No PFO or ASD.  Patient was discharged home and he finished full course of 14 days of IV Rocephin on 04/02/2020 per ID recommendation.  Repeat blood culture was also negative.  08/25/20 cycle 10 FOLFIRINOX 09/08/20- present  Starting cycle 11, FOLFIRI, Oxaliplatin discontinued due to neuropathy   #NGS showed no reportable targetable mutation #Genetic testing-[employer] diagnostic testing showed no pathological variants identified. MS stable, TMB 016m/mb, KRAS G12D, SF3B1 K700E, TP53 [id number]  #01/14/2021  [facility], [facility].  # [facility] [Th[facility] Scrapere 4~5 liver lesions on OSH MRI last year at the time of diagnosis highly suspicious for metastasis. She  is not eligible for surgical protocol for metastasis resection given the number of liver metastasis is >3. However given her excellent  and durable response to chemo, surgical resection may be considered pending sustained disease control at 1 year and may require partial hepatectomy first to see if there is viable tumor before proceeding to Whipple resection ]  Patient had a COVID-19 infection in September 2022.  03/23/2021 CT abdomen pelvis No significant change in the ill-defined pancreatic head mass or  associated biliary ductal and pancreatic ductal dilation.  Slight interval enlargement of subcentimeter anterior peripancreatic  lymph node, nonspecific. Attention on follow-up per clinical protocol. Similar enlargement of the ascending thoracic aorta measuring 4.4 cm  03/23/2021 MRI abdomen w and wo  Increasing ill-defined hypoenhancement of the pancreatic head surrounding the common bile duct stent, in keeping with known pancreatic malignancy. Anterior peripancreatic lymph node is better evaluated on CT.  Unchanged position of a common bile duct stent with similar mild diffuse intrahepatic ductal dilation and pneumobilia. No evidence of metastatic disease in the abdomen or pelvis. Partially visualized cystic left adnexal lesion measuring up to 3.9 cm.   04/10/2021, patient underwent liver resection at [facility], by Dr. [name].   Pathology report from [place] was reviewed. Segments 3 partial hepatectomy, negative for viable tumor. Section 5/6 partial hepatectomy part 1 and 2, microscopic foci of residual viable adenocarcinoma, morphologically consistent with history of pancreatic primary.  Parenchymal margin is uninvolved.  #Patient resumed on FOLFIRI on 04/28/2021. She got another cycle on 05/12/2021 #05/26/2021, patient did not get additional chemotherapy due to transaminitis.  Shared decision was made to stop FOLFIRI and switch to gemcitabine Abraxane treatments.  05/26/2021, patient developed transaminitis, AST 763, ALT 691, alkaline phosphatase 364.  Bilirubin 0.7 05/26/2021 stat ultrasound abdomen right upper quadrant showed interval  development of 3.3 x 1.8 cm complex mass in the right hepatic lobe.  Increased extrahepatic ductal dilatation.  Concerning for CBD obstruction. 05/29/2021, MRI abdomen MRCP with and without contrast showed unchanged pancreatic head soft tissue.  Severe intra and extrahepatic biliary ductal dilatation.  Common bile duct stents remain in position however patency is not established.  No evidence of lymphadenopathy or metastatic disease in the abdomen.  With further communication with radiologist, addendum was added that there is internal fluid signal and no appreciable associated contrast enhancement measuring 3.4 x 2.3 cm.  This appearance is generally not consistent with metastasis and is of uncertain significance.  Possibly reflecting hepatic abscess or residual of subcapsular hematoma.  Patient was seen by [facility] Dr. Alwyn Pea team.  He had ERCP done on 06/05/2021, with findings of One stent from the biliary tree was seen in the major papilla. The stent had migrated significantly into the duodenum. This is the cause of stent malfunction. One stent was removed from the biliary tree. Prior biliary sphincterotomy appeared open. - A single severe biliary stricture was found in the lower third of the main bile duct with upstream dilation. The stricture was alignant appearing.- The biliary tree was swept and sludge was found.- One fully covered metal stent was placed into the common bile duct across the stricture.- No pancreatogram performed.  06/12/2021 Gemcitabine and Abraxane.   INTERVAL HISTORY Cassandra [name] is a 66 y.o. female who has above history reviewed by me today presents for pancreatic cancer.  Patient feels more tired after last chemotherapy No nausea vomiting diarrhea.   She felt 2 days of " feverish" during the interval. Temperature at home was 99.  She took  a tylenol and symptoms improved.  She stopped drinking carbonated water and felt that the abdomen bloating has improved.    Review of Systems   Constitutional:  Negative for appetite change, chills, fatigue, fever and unexpected weight change.  HENT:   Negative for hearing loss and voice change.   Eyes:  Negative for eye problems.  Respiratory:  Negative for chest tightness and cough.   Cardiovascular:  Negative for chest pain.  Gastrointestinal:  Negative for abdominal distention, abdominal pain, blood in stool and nausea.  Endocrine: Negative for hot flashes.  Genitourinary:  Negative for difficulty urinating and frequency.   Musculoskeletal:  Negative for arthralgias.  Skin:  Negative for itching and rash.  Neurological:  Positive for numbness. Negative for extremity weakness.  Hematological:  Negative for adenopathy.  Psychiatric/Behavioral:  Negative for confusion.    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   Neutropenia ([place]) 04/18/2020   Osteopenia after menopause 05/2017   femoral neck T score -2.0   Personal history of chemotherapy    current for pancreatic ca   PONV (postoperative nausea and vomiting)    Pure hypercholesterolemia     SURGICAL HISTORY: Past Surgical History:  Procedure Laterality Date   COLONOSCOPY  07/2015   WNL   COLONOSCOPY  2008/2011   COLONOSCOPY WITH PROPOFOL N/A 07/10/2021   Procedure: COLONOSCOPY WITH PROPOFOL;  Surgeon: [name], MD;  Location: [facility] ENDOSCOPY;  Service: Endoscopy;  Laterality: N/A;   IR CV LINE INJECTION  04/28/2021   OVARIAN CYST REMOVAL  1992   dermoid-Dr CAK   PORTA CATH INSERTION N/A 03/07/2020   Procedure: PORTA CATH INSERTION;  Surgeon: Algernon Huxley, MD;  Location: [place] CV LAB;  Service: Cardiovascular;  Laterality: N/A;   TEE WITHOUT CARDIOVERSION N/A 03/21/2020   Procedure: TRANSESOPHAGEAL ECHOCARDIOGRAM (TEE);  Surgeon: Dionisio David, MD;  Location: [facility] ORS;  Service:  Cardiovascular;  Laterality: N/A;   TUBAL LIGATION  1993    SOCIAL HISTORY: Social History   Socioeconomic History   Marital status: Married    Spouse name: Not on file   Number of children: 2   Years of education: Not on file   Highest education level: Not on file  Occupational History   Occupation: Pharmacist, hospital    Comment: retired   Occupation: Psychologist, sport and exercise  Tobacco Use   Smoking status: Former    Packs/day: 1.00    Years: 12.00    Pack years: 12.00    Types: Cigarettes    Quit date: 1987    Years since quitting: 36.1   Smokeless tobacco: Never   Tobacco comments:    Quit smoking 1987  Vaping Use   Vaping Use: Never used  Substance and Sexual Activity   Alcohol use: Not Currently    Alcohol/week: 7.0 standard drinks    Types: 7 Glasses of wine per week    Comment: 0-2 mixed drinks a day   Drug use: No   Sexual activity: Not Currently    Birth control/protection: Post-menopausal  Other Topics Concern   Not on file  Social History Narrative   Not on file   Social Determinants of Health   Financial Resource Strain: Not on file  Food Insecurity: Not on file  Transportation Needs: Not  on file  Physical Activity: Not on file  Stress: Not on file  Social Connections: Not on file  Intimate Partner Violence: Not on file    FAMILY HISTORY: Family History  Problem Relation Age of Onset   Breast cancer Paternal Aunt 31   Diabetes Mother    Osteoporosis Mother    Hyperlipidemia Father    Rheumatic fever Father    Valvular heart disease Father    Cancer Paternal Grandmother        possible colon   Breast cancer Sister 42    ALLERGIES:  is allergic to penicillin g.  MEDICATIONS:  Current Outpatient Medications  Medication Sig Dispense Refill   Calcium Carbonate-Vit D-Min (CALCIUM 1200 PO) Take by mouth.     Cholecalciferol 25 MCG (1000 UT) tablet Take 2,000 Units by mouth daily.     CREON 24000-76000 units CPEP TAKE 1 CAPSULE (24,000 UNITS TOTAL) BY MOUTH 3 (THREE)  TIMES DAILY BEFORE MEALS. 200 capsule 3   Cyanocobalamin (VITAMIN B 12 PO) Take by mouth.     Multiple Vitamins-Minerals (MULTIVITAMIN WITH MINERALS) tablet Take 1 tablet by mouth daily.     ondansetron (ZOFRAN-ODT) 4 MG disintegrating tablet Take 4 mg by mouth every 8 (eight) hours as needed.     simethicone (GAS-X) 80 MG chewable tablet Chew 1 tablet (80 mg total) by mouth every 8 (eight) hours as needed for flatulence. 60 tablet 0   gabapentin (NEURONTIN) 100 MG capsule Take 1 capsule (100 mg total) by mouth at bedtime. (Patient not taking: Reported on 07/20/2021) 30 capsule 1   magic mouthwash w/lidocaine SOLN Take 5 mLs by mouth 4 (four) times daily as needed for mouth pain. Sig: Swish & Spit 5-10 ml four times a day as needed. Dispense 480 ml. 1RF (Patient not taking: Reported on 12/17/2020) 480 mL 1   No current facility-administered medications for this visit.   Facility-Administered Medications Ordered in Other Visits  Medication Dose Route Frequency Provider Last Rate Last Admin   sodium chloride flush (NS) 0.9 % injection 10 mL  10 mL Intravenous PRN [name], MD   10 mL at 02/18/21 0858   sodium chloride flush (NS) 0.9 % injection 10 mL  10 mL Intravenous Once Borders, Vonna Kotyk R, NP       sodium chloride flush (NS) 0.9 % injection 10 mL  10 mL Intravenous Once [name], MD         PHYSICAL EXAMINATION: ECOG PERFORMANCE STATUS: 0 - Asymptomatic Vitals:   08/10/21 0841  BP: 116/74  Pulse: 62  Resp: 18  Temp: 97.8 F (36.6 C)   Filed Weights   08/10/21 0841  Weight: 139 lb 12.8 oz (63.4 kg)    Physical Exam Constitutional:      General: She is not in acute distress. HENT:     Head: Normocephalic and atraumatic.  Eyes:     General: No scleral icterus. Cardiovascular:     Rate and Rhythm: Normal rate and regular rhythm.     Heart sounds: Normal heart sounds.  Pulmonary:     Effort: Pulmonary effort is normal. No respiratory distress.     Breath sounds: No wheezing.   Abdominal:     General: Bowel sounds are normal. There is no distension.     Palpations: Abdomen is soft.  Musculoskeletal:        General: No deformity. Normal range of motion.     Cervical back: Normal range of motion and neck supple.  Skin:  General: Skin is warm and dry.     Coloration: Skin is not jaundiced.     Findings: No erythema or rash.  Neurological:     Mental Status: She is alert and oriented to person, place, and time. Mental status is at baseline.     Cranial Nerves: No cranial nerve deficit.     Coordination: Coordination normal.  Psychiatric:        Mood and Affect: Mood normal.    LABORATORY DATA:  I have reviewed the data as listed Lab Results  Component Value Date   WBC 5.0 08/10/2021   HGB 11.1 (L) 08/10/2021   HCT 33.3 (L) 08/10/2021   MCV 98.2 08/10/2021   PLT 257 08/10/2021   Recent Labs    07/20/21 0813 07/27/21 0848 08/10/21 0816  NA 141 136 137  K 4.3 3.9 4.2  CL 107 101 106  CO2 _0 GLUCOSE 97 120* 126*  BUN _1 CREATININE 0.72 0.67 0.74  CALCIUM 8.9 8.9 8.4*  GFRNONAA >60 >60 >60  PROT 6.1* 6.4* 6.4*  ALBUMIN 3.8 3.9 3.7  AST 28 29 52*  ALT 25 30 41  ALKPHOS 116 117 116  BILITOT 0.3 0.6 <0.1*    Iron/TIBC/Ferritin/ %Sat No results found for: IRON, TIBC, FERRITIN, IRONPCTSAT    RADIOGRAPHIC STUDIES: I have personally reviewed the radiological images as listed and agreed with the findings in the report. Reviewed findings of MRI abdomen MR[facility] at [facility]. MR 3D Recon At Scanner  Addendum Date: 05/29/2021   ADDENDUM REPORT: 05/29/2021 08:43 ADDENDUM: There is a subcapsular, nonenhancing lesion of the inferior tip of the right lobe of the liver, hepatic segment VII, adjacent to the gallbladder fossa, with internal fluid signal and no appreciable associated contrast enhancement measuring 3.4 x 2.3 cm. This appearance is generally not consistent with metastasis and is of uncertain significance, possibly reflecting a  hepatic abscess in the setting of biliary obstruction or residua of a subcapsular hematoma. Findings discussed with Dr. Tasia Catchings on 05/27/2021 and 05/29/2021. Electronically Signed   By: Delanna Ahmadi M.D.   On: 05/29/2021 08:43   Result Date: 05/29/2021 CLINICAL DATA:  Pancreatic cancer, elevated LFTs EXAM: MRI ABDOMEN WITHOUT AND WITH CONTRAST (INCLUDING MRCP) TECHNIQUE: Multiplanar multisequence MR imaging of the abdomen was performed both before and after the administration of intravenous contrast. Heavily T2-weighted images of the biliary and pancreatic ducts were obtained, and three-dimensional MRCP images were rendered by post processing. CONTRAST:  56m GADAVIST GADOBUTROL 1 MMOL/ML IV SOLN COMPARISON:  CT chest abdomen pelvis, 10/31/2020 FINDINGS: Lower chest: No acute findings. Hepatobiliary: Severe intra and extrahepatic biliary ductal dilatation, increased compared to prior CT. The common bile duct measures 1.8 cm, previously 1.1 cm. Common bile duct stent remains in position. Portal veins are patent. Pancreas: Unchanged post treatment appearance of pancreatic head soft tissue, although ill-defined measuring approximately 2.9 x 2.6 cm (series 14, image 40). Severe atrophy of the pancreatic parenchyma distally with prominence of the pancreatic duct. Spleen:  Within normal limits in size and appearance. Adrenals/Urinary Tract: No masses identified. No evidence of hydronephrosis. Stomach/Bowel: Visualized portions within the abdomen are unremarkable. Vascular/Lymphatic: No pathologically enlarged lymph nodes identified. No abdominal aortic aneurysm demonstrated. Other:  None. Musculoskeletal: No suspicious bone lesions identified. IMPRESSION: 1. Unchanged post treatment appearance of pancreatic head soft tissue, in keeping with primary pancreatic malignancy. 2. Severe intra and extrahepatic biliary ductal dilatation, increased compared to prior CT. Common bile duct stent remains in  position, however patency is  not established on this MR examination. 3. No evidence of lymphadenopathy or metastatic disease in the abdomen. Electronically Signed: By: Delanna Ahmadi M.D. On: 05/27/2021 13:31   MR ABDOMEN MRCP W WO CONTAST  Addendum Date: 05/29/2021   ADDENDUM REPORT: 05/29/2021 08:43 ADDENDUM: There is a subcapsular, nonenhancing lesion of the inferior tip of the right lobe of the liver, hepatic segment VII, adjacent to the gallbladder fossa, with internal fluid signal and no appreciable associated contrast enhancement measuring 3.4 x 2.3 cm. This appearance is generally not consistent with metastasis and is of uncertain significance, possibly reflecting a hepatic abscess in the setting of biliary obstruction or residua of a subcapsular hematoma. Findings discussed with Dr. Tasia Catchings on 05/27/2021 and 05/29/2021. Electronically Signed   By: Delanna Ahmadi M.D.   On: 05/29/2021 08:43   Result Date: 05/29/2021 CLINICAL DATA:  Pancreatic cancer, elevated LFTs EXAM: MRI ABDOMEN WITHOUT AND WITH CONTRAST (INCLUDING MRCP) TECHNIQUE: Multiplanar multisequence MR imaging of the abdomen was performed both before and after the administration of intravenous contrast. Heavily T2-weighted images of the biliary and pancreatic ducts were obtained, and three-dimensional MRCP images were rendered by post processing. CONTRAST:  20m GADAVIST GADOBUTROL 1 MMOL/ML IV SOLN COMPARISON:  CT chest abdomen pelvis, 10/31/2020 FINDINGS: Lower chest: No acute findings. Hepatobiliary: Severe intra and extrahepatic biliary ductal dilatation, increased compared to prior CT. The common bile duct measures 1.8 cm, previously 1.1 cm. Common bile duct stent remains in position. Portal veins are patent. Pancreas: Unchanged post treatment appearance of pancreatic head soft tissue, although ill-defined measuring approximately 2.9 x 2.6 cm (series 14, image 40). Severe atrophy of the pancreatic parenchyma distally with prominence of the pancreatic duct. Spleen:  Within  normal limits in size and appearance. Adrenals/Urinary Tract: No masses identified. No evidence of hydronephrosis. Stomach/Bowel: Visualized portions within the abdomen are unremarkable. Vascular/Lymphatic: No pathologically enlarged lymph nodes identified. No abdominal aortic aneurysm demonstrated. Other:  None. Musculoskeletal: No suspicious bone lesions identified. IMPRESSION: 1. Unchanged post treatment appearance of pancreatic head soft tissue, in keeping with primary pancreatic malignancy. 2. Severe intra and extrahepatic biliary ductal dilatation, increased compared to prior CT. Common bile duct stent remains in position, however patency is not established on this MR examination. 3. No evidence of lymphadenopathy or metastatic disease in the abdomen. Electronically Signed: By: ADelanna AhmadiM.D. On: 05/27/2021 13:31   UKoreaAbdomen Limited RUQ (LIVER/GB)  Result Date: 05/26/2021 CLINICAL DATA:  Abnormal liver enzymes. EXAM: ULTRASOUND ABDOMEN LIMITED RIGHT UPPER QUADRANT COMPARISON:  March 19, 2021.  Oct 30, 2020. FINDINGS: Gallbladder: No gallstones or wall thickening visualized. No sonographic Murphy sign noted by sonographer. Common bile duct: Diameter: 16 mm which is increased compared to prior exam. Liver: 3.3 x 1.8 cm complex mass is seen in right hepatic lobe peripherally. Mild intrahepatic biliary dilatation is noted. Within normal limits in parenchymal echogenicity. Portal vein is patent on color Doppler imaging with normal direction of blood flow towards the liver. Other: None. IMPRESSION: Interval development of 3.3 x 1.8 cm complex mass in right hepatic lobe. CT or MRI with intravenous contrast is recommended to evaluate for metastatic disease or other neoplasm. Increased extrahepatic ductal dilatation is noted concerning for distal common bile duct obstruction. Correlation with liver function tests is recommended. Electronically Signed   By: JMarijo ConceptionM.D.   On: 05/26/2021 14:18       ASSESSMENT & PLAN:  1. Primary pancreatic cancer ([place]   2. 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 Anemia due to antineoplastic chemotherapy   5. 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  Pathology proved residual disease  Labs are reviewed and discussed with patient Proceed with cycle 3-day 1 gemcitabine Abraxane. Patient will be out of town next week and preferred to be of chemotherapy treatment.  For cycle 3, plan to do day 1 and day 15 treatments.  Discussed with the patient that we will resume cycle 4 with 2 weeks on 1 week off plan. Patient to contact [facility] Dr. [name] for plan of repeat surveillance imaging.  #Chemotherapy-induced neuropathy of fingertips and toes, grade 2.  Follow-up with neurology.  Not tolerating Cymbalta.  Dose reduced Abraxane, stable neuropathy symptoms.  #pancreatic insufficiency.  Continue Creon, continue 24000 units TID with meals.  Weight is stable.  #Elevated CEA,-s/p colonoscopy  # Chemotherapy induced anemia,   All questions were answered. The patient knows to call the clinic with any problems questions or concerns.  Return of visit: 2 weeks for cycle 3-day 15 gemcitabine and Abraxane. [name], MD, PhD  08/10/2021

## 2021-08-10 NOTE — Patient Instructions (Signed)
[facility] [place]  Discharge Instructions: Thank you for choosing [facility] to provide your oncology and hematology care.  If you have a lab appointment with the [facility], please go directly to the [facility] and check in at the registration area.  Wear comfortable clothing and clothing appropriate for easy access to any Portacath or PICC line.   We strive to give you quality time with your provider. You may need to reschedule your appointment if you arrive late (15 or more minutes).  Arriving late affects you and other patients whose appointments are after yours.  Also, if you miss three or more appointments without notifying the office, you may be dismissed from the clinic at the providers discretion.      For prescription refill requests, have your pharmacy contact our office and allow 72 hours for refills to be completed.    Today you received the following chemotherapy and/or immunotherapy agents Abraxane, gemzar     To help prevent nausea and vomiting after your treatment, we encourage you to take your nausea medication as directed.  BELOW ARE SYMPTOMS THAT SHOULD BE REPORTED IMMEDIATELY: *FEVER GREATER THAN 100.4 F (38 C) OR HIGHER *CHILLS OR SWEATING *NAUSEA AND VOMITING THAT IS NOT CONTROLLED WITH YOUR NAUSEA MEDICATION *UNUSUAL SHORTNESS OF BREATH *UNUSUAL BRUISING OR BLEEDING *URINARY PROBLEMS (pain or burning when urinating, or frequent urination) *BOWEL PROBLEMS (unusual diarrhea, constipation, pain near the anus) TENDERNESS IN MOUTH AND THROAT WITH OR WITHOUT PRESENCE OF ULCERS (sore throat, sores in mouth, or a toothache) UNUSUAL RASH, SWELLING OR PAIN  UNUSUAL VAGINAL DISCHARGE OR ITCHING   Items with * indicate a potential emergency and should be followed up as soon as possible or go to the Emergency Department if any problems should occur.  Please show the CHEMOTHERAPY ALERT CARD or IMMUNOTHERAPY ALERT CARD at  check-in to the Emergency Department and triage nurse.  Should you have questions after your visit or need to cancel or reschedule your appointment, please contact [facility] [place]  [phone] and follow the prompts.  Office hours are 8:00 a.m. to 4:30 p.m. Monday - Friday. Please note that voicemails left after 4:00 p.m. may not be returned until the following business day.  We are closed weekends and major holidays. You have access to a nurse at all times for urgent questions. Please call the main number to the clinic [phone] and follow the prompts.  For any non-urgent questions, you may also contact your provider using MyChart. We now offer e-Visits for anyone 55 and older to request care online for non-urgent symptoms. For details visit [email or web address].   Also download the MyChart app! Go to the app store, search "MyChart", open the app, select [facility], and log in with your MyChart username and password.  Due to Covid, a mask is required upon entering the hospital/clinic. If you do not have a mask, one will be given to you upon arrival. For doctor visits, patients may have 1 support person aged 38 or older with them. For treatment visits, patients cannot have anyone with them due to current Covid guidelines and our immunocompromised population.

## 2021-08-11 LAB — CANCER ANTIGEN 19-9: CA 19-9: 6 U/mL (ref 0–35)

## 2021-08-19 ENCOUNTER — Telehealth: Payer: Self-pay | Admitting: *Deleted

## 2021-08-19 NOTE — Telephone Encounter (Signed)
Patient informed of doctor response and she thanked me for letting her know

## 2021-08-19 NOTE — Telephone Encounter (Signed)
Per Dr. Tasia Catchings, patient ok to take Ibuprofen while traveling. Thanks.

## 2021-08-19 NOTE — Telephone Encounter (Signed)
Patient called stating that she is going out of town and traveling makes her sore. She is asking if she can take ibuprofen since she is on chemotherapy. Please advise

## 2021-08-24 ENCOUNTER — Inpatient Hospital Stay (HOSPITAL_BASED_OUTPATIENT_CLINIC_OR_DEPARTMENT_OTHER): Payer: Medicare Other | Admitting: Oncology

## 2021-08-24 ENCOUNTER — Encounter: Payer: Self-pay | Admitting: Oncology

## 2021-08-24 ENCOUNTER — Inpatient Hospital Stay: Payer: Medicare Other

## 2021-08-24 ENCOUNTER — Other Ambulatory Visit: Payer: Self-pay

## 2021-08-24 VITALS — BP 116/76 | HR 58 | Temp 97.6°F | Resp 16 | Wt 140.1 lb

## 2021-08-24 DIAGNOSIS — G620 Drug-induced polyneuropathy: Secondary | ICD-10-CM | POA: Diagnosis not present

## 2021-08-24 DIAGNOSIS — K8689 Other specified diseases of pancreas: Secondary | ICD-10-CM

## 2021-08-24 DIAGNOSIS — Z5111 Encounter for antineoplastic chemotherapy: Secondary | ICD-10-CM | POA: Diagnosis not present

## 2021-08-24 DIAGNOSIS — C259 Malignant neoplasm of pancreas, unspecified: Secondary | ICD-10-CM

## 2021-08-24 DIAGNOSIS — D6481 Anemia due to antineoplastic chemotherapy: Secondary | ICD-10-CM

## 2021-08-24 DIAGNOSIS — T451X5A Adverse effect of antineoplastic and immunosuppressive drugs, initial encounter: Secondary | ICD-10-CM

## 2021-08-24 DIAGNOSIS — R970 Elevated carcinoembryonic antigen [CEA]: Secondary | ICD-10-CM

## 2021-08-24 LAB — COMPREHENSIVE METABOLIC PANEL
ALT: 29 U/L (ref 0–44)
AST: 37 U/L (ref 15–41)
Albumin: 3.9 g/dL (ref 3.5–5.0)
Alkaline Phosphatase: 97 U/L (ref 38–126)
Anion gap: 7 (ref 5–15)
BUN: 17 mg/dL (ref 8–23)
CO2: 25 mmol/L (ref 22–32)
Calcium: 8.6 mg/dL — ABNORMAL LOW (ref 8.9–10.3)
Chloride: 105 mmol/L (ref 98–111)
Creatinine, Ser: 0.73 mg/dL (ref 0.44–1.00)
GFR, Estimated: >60 mL/min (ref >60)
Glucose, Bld: 114 mg/dL — ABNORMAL HIGH (ref 70–99)
Potassium: 4.2 mmol/L (ref 3.5–5.1)
Sodium: 137 mmol/L (ref 135–145)
Total Bilirubin: 0.2 mg/dL — ABNORMAL LOW (ref 0.3–1.2)
Total Protein: 6.5 g/dL (ref 6.5–8.1)

## 2021-08-24 LAB — CBC WITH DIFFERENTIAL/PLATELET
Abs Immature Granulocytes: 0.02 10*3/uL (ref 0.00–0.07)
Basophils Absolute: 0 10*3/uL (ref 0.0–0.1)
Basophils Relative: 1 %
Eosinophils Absolute: 0.1 10*3/uL (ref 0.0–0.5)
Eosinophils Relative: 2 %
HCT: 35.5 % — ABNORMAL LOW (ref 36.0–46.0)
Hemoglobin: 11.7 g/dL — ABNORMAL LOW (ref 12.0–15.0)
Immature Granulocytes: 0 %
Lymphocytes Relative: 29 %
Lymphs Abs: 1.4 10*3/uL (ref 0.7–4.0)
MCH: 32.5 pg (ref 26.0–34.0)
MCHC: 33 g/dL (ref 30.0–36.0)
MCV: 98.6 fL (ref 80.0–100.0)
Monocytes Absolute: 0.7 10*3/uL (ref 0.1–1.0)
Monocytes Relative: 13 %
Neutro Abs: 2.8 10*3/uL (ref 1.7–7.7)
Neutrophils Relative %: 55 %
Platelets: 133 10*3/uL — ABNORMAL LOW (ref 150–400)
RBC: 3.6 MIL/uL — ABNORMAL LOW (ref 3.87–5.11)
RDW: 14.7 % (ref 11.5–15.5)
WBC: 5 10*3/uL (ref 4.0–10.5)
nRBC: 0 % (ref 0.0–0.2)

## 2021-08-24 MED ORDER — PROCHLORPERAZINE MALEATE 10 MG PO TABS
10.0000 mg | ORAL_TABLET | Freq: Once | ORAL | Status: AC
  Administered 2021-08-24: 10 mg via ORAL
  Filled 2021-08-24: qty 1

## 2021-08-24 MED ORDER — HEPARIN SOD (PORK) LOCK FLUSH 100 UNIT/ML IV SOLN
INTRAVENOUS | Status: AC
  Administered 2021-08-24: 500 [IU]
  Filled 2021-08-24: qty 5

## 2021-08-24 MED ORDER — PACLITAXEL PROTEIN-BOUND CHEMO INJECTION 100 MG
100.0000 mg/m2 | Freq: Once | INTRAVENOUS | Status: AC
  Administered 2021-08-24: 175 mg via INTRAVENOUS
  Filled 2021-08-24: qty 35

## 2021-08-24 MED ORDER — SODIUM CHLORIDE 0.9 % IV SOLN
1000.0000 mg/m2 | Freq: Once | INTRAVENOUS | Status: AC
  Administered 2021-08-24: 1748 mg via INTRAVENOUS
  Filled 2021-08-24: qty 21.04

## 2021-08-24 MED ORDER — HEPARIN SOD (PORK) LOCK FLUSH 100 UNIT/ML IV SOLN
500.0000 [IU] | Freq: Once | INTRAVENOUS | Status: AC | PRN
  Filled 2021-08-24: qty 5

## 2021-08-24 MED ORDER — SODIUM CHLORIDE 0.9 % IV SOLN
Freq: Once | INTRAVENOUS | Status: AC
  Filled 2021-08-24: qty 250

## 2021-08-24 NOTE — Progress Notes (Signed)
Hematology/Oncology follow up note [phone] [phone]   Patient Care Team: [name], MD as PCP - General (Family Medicine) Flinchum, Kelby Aline, FNP (Family Medicine) [name], RN as Oncology Nurse Navigator [name], MD as Consulting Physician (Oncology)  REFERRING PROVIDER: [name] [name], MD  CHIEF COMPLAINTS/REASON FOR VISIT:  Follow up for treatment of pancreatic adenocarcinoma  HISTORY OF PRESENTING ILLNESS:   Cassandra [name] is a  66 y.o.  female with PMH listed below was seen in consultation at the request of  [name], MD  for evaluation of pancreatic adenocarcinoma Patient initially presented with jaundice, transaminitis, bilirubin was 9.9.  CA 19-9 was 1874.  Patient also reports unintentional weight loss. 02/08/2020 MRI abdomen and MRCP with and without contrast was done at [facility] which showed pancreatic head mass measuring up to 3 cm, with marked associated narrowing of the portal vein confluence.  SMA is preserved.  Marked intrahepatic and extrahepatic biliary duct dilatation as well as mild dilatation of the main pancreatic duct. Multiple hepatic masses highly concerning for metastatic disease.  Patient underwent EUS on 02/19/2020, which showed irregular mass identified in the pancreatic head, hypoechoic, measured 55mx33mm, sonographic evidence concerning for invasion into the superior mesenteric artery.  There is no sign of significant abnormality in the main pancreatic duct.  Dilatation of common bile duct which measured up to 16 mm.  Region of celiac artery was visualized and showed no signs of significant abnormality.  No lymphadenopathy.  FNA showed adenocarcinoma.  02/19/2020, ERCP, malignant.  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  The stricture was treated with placement of wall flex metal stent.  [facility] ZPia [facility]auand  was recommended for 3 drug regimen FOLFIRINOX.  [facility]  Patient was referred to establish care today. She denies any pain.  Since stent placement, skin jaundice has improved.  Itchiness has also improved. Patient was accompanied by her husband today.  She has a family history of breast cancer in sister and paternal aunt, colon cancer paternal grandmother.  #No reportable targetable mutation on NGS 9/14/2021cycle 1 FOLFIRINOX.  Patient received oxaliplatin and about 50% of Irinotecan on day 1 and had experienced neurologic symptoms.  She went to ER and working diagnosis is TIA, and eventually I think this is due to irinotecan side effects -irinotecan-associated dysarthria, lip/tongue numbness.  Adjustment was made for Irinotecan to be  infused over 180 minutes.  Atropine 0.5 mg once prior to the irinotecan. No recurrent symptoms.   # 03/19/2020-03/21/2020 patient was admitted due to sepsis with strep pneumonia bacteremia.  Patient was treated with IV Rocephin.  TEE was done which showed no vegetation.  No PFO or ASD.  Patient was discharged home and he finished full course of 14 days of IV Rocephin on 04/02/2020 per ID recommendation.  Repeat blood culture was also negative.  08/25/20 cycle 10 FOLFIRINOX 09/08/20- present  Starting cycle 11, FOLFIRI, Oxaliplatin discontinued due to neuropathy   #NGS showed no reportable targetable mutation #Genetic testing-[employer] diagnostic testing showed no pathological variants identified. MS stable, TMB 016m/mb, KRAS G12D, SF3B1 K700E, TP53 [id number]  #01/14/2021  [facility].  # [facility] [[facility] 4~5 liver lesions on OSH MRI last year at the time of diagnosis highly suspicious for metastasis. She  is not eligible for surgical protocol for metastasis resection given the number of liver metastasis is >3. However given her excellent  and durable response to chemo, surgical resection may be considered pending sustained disease control at 1 year and may require partial hepatectomy first to see if there is viable tumor before proceeding to Whipple resection ]  Patient had a COVID-19 infection in September 2022.  03/23/2021 CT abdomen pelvis No significant change in the ill-defined pancreatic head mass or  associated biliary ductal and pancreatic ductal dilation.  Slight interval enlargement of subcentimeter anterior peripancreatic  lymph node, nonspecific. Attention on follow-up per clinical protocol. Similar enlargement of the ascending thoracic aorta measuring 4.4 cm  03/23/2021 MRI abdomen w and wo  Increasing ill-defined hypoenhancement of the pancreatic head surrounding the common bile duct stent, in keeping with known pancreatic malignancy. Anterior peripancreatic lymph node is better evaluated on CT.  Unchanged position of a common bile duct stent with similar mild diffuse intrahepatic ductal dilation and pneumobilia. No evidence of metastatic disease in the abdomen or pelvis. Partially visualized cystic left adnexal lesion measuring up to 3.9 cm.   04/10/2021, patient underwent liver resection at [facility], by Dr. [name].   Pathology report from [place] was reviewed. Segments 3 partial hepatectomy, negative for viable tumor. Section 5/6 partial hepatectomy part 1 and 2, microscopic foci of residual viable adenocarcinoma, morphologically consistent with history of pancreatic primary.  Parenchymal margin is uninvolved.  #Patient resumed on FOLFIRI on 04/28/2021. She got another cycle on 05/12/2021 #05/26/2021, patient did not get additional chemotherapy due to transaminitis.  Shared decision was made to stop FOLFIRI and switch to gemcitabine Abraxane treatments.  05/26/2021, patient developed transaminitis, AST 763, ALT 691, alkaline phosphatase 364.  Bilirubin 0.7 05/26/2021 stat ultrasound abdomen right upper quadrant showed interval  development of 3.3 x 1.8 cm complex mass in the right hepatic lobe.  Increased extrahepatic ductal dilatation.  Concerning for CBD obstruction. 05/29/2021, MRI abdomen MRCP with and without contrast showed unchanged pancreatic head soft tissue.  Severe intra and extrahepatic biliary ductal dilatation.  Common bile duct stents remain in position however patency is not established.  No evidence of lymphadenopathy or metastatic disease in the abdomen.  With further communication with radiologist, addendum was added that there is internal fluid signal and no appreciable associated contrast enhancement measuring 3.4 x 2.3 cm.  This appearance is generally not consistent with metastasis and is of uncertain significance.  Possibly reflecting hepatic abscess or residual of subcapsular hematoma.  Patient was seen by [facility] Dr. Alwyn Pea team.  He had ERCP done on 06/05/2021, with findings of One stent from the biliary tree was seen in the major papilla. The stent had migrated significantly into the duodenum. This is the cause of stent malfunction. One stent was removed from the biliary tree. Prior biliary sphincterotomy appeared open. - A single severe biliary stricture was found in the lower third of the main bile duct with upstream dilation. The stricture was alignant appearing.- The biliary tree was swept and sludge was found.- One fully covered metal stent was placed into the common bile duct across the stricture.- No pancreatogram performed.  06/12/2021 Gemcitabine and Abraxane.   INTERVAL HISTORY Cassandra [name] is a 66 y.o. female who has above history reviewed by me today presents for pancreatic cancer.  Patient came back from a family trip to [facility].  She enjoyed her trip and feels well today.  Last week's chemotherapy was not given  no new complaints.  Review of Systems  °Constitutional:  Negative for appetite change, chills, fatigue, fever and unexpected weight change.  °HENT:   Negative  for hearing loss and voice change.   °Eyes:  Negative for eye problems.  °Respiratory:  Negative for chest tightness and cough.   °Cardiovascular:  Negative for chest pain.  °Gastrointestinal:  Negative for abdominal distention, abdominal pain, blood in stool and nausea.  °Endocrine: Negative for hot flashes.  °Genitourinary:  Negative for difficulty urinating and frequency.   °Musculoskeletal:  Negative for arthralgias.  °Skin:  Negative for itching and rash.  °Neurological:  Positive for numbness. Negative for extremity weakness.  °Hematological:  Negative for adenopathy.  °Psychiatric/Behavioral:  Negative for confusion.   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  ° Neutropenia (HCC) 04/18/2020  ° Osteopenia after menopause 05/2017  ° femoral neck T score -2.0  ° Personal history of chemotherapy   ° current for pancreatic ca  ° PONV (postoperative nausea and vomiting)   ° Pure hypercholesterolemia   ° ° °SURGICAL HISTORY: °Past Surgical History:  °Procedure Laterality Date  ° COLONOSCOPY  07/2015  ° WNL  ° COLONOSCOPY  2008/2011  ° COLONOSCOPY WITH PROPOFOL N/A 07/10/2021  ° Procedure: COLONOSCOPY WITH PROPOFOL;  Surgeon: Locklear, Cameron T, MD;  Location: [facility] ENDOSCOPY;  Service: Endoscopy;  Laterality: N/A;  ° IR CV LINE INJECTION  04/28/2021  ° OVARIAN CYST REMOVAL  1992  ° dermoid-Dr CAK  ° PORTA CATH INSERTION N/A 03/07/2020  ° Procedure: PORTA CATH INSERTION;  Surgeon: Dew, Jason S, MD;  Location: [facility] INVASIVE CV LAB;  Service: Cardiovascular;  Laterality: N/A;  ° TEE WITHOUT CARDIOVERSION N/A 03/21/2020  ° Procedure: TRANSESOPHAGEAL ECHOCARDIOGRAM (TEE);  Surgeon: Khan, Shaukat A, MD;  Location: [facility] ORS;  Service: Cardiovascular;  Laterality: N/A;  ° TUBAL LIGATION  1993  ° ° °SOCIAL HISTORY: °Social History  ° °Socioeconomic History  °  Marital status: Married  °  Spouse name: Not on file  ° Number of children: 2  ° Years of education: Not on file  ° Highest education level: Not on file  °Occupational History  ° Occupation: Teacher  °  Comment: retired  ° Occupation: Farmer  °Tobacco Use  ° Smoking status: Former  °  Packs/day: 1.00  °  Years: 12.00  °  Pack years: 12.00  °  Types: Cigarettes  °  Quit date: 1987  °  Years since quitting: 36.1  ° Smokeless tobacco: Never  ° Tobacco comments:  °  Quit smoking 1987  °Vaping Use  ° Vaping Use: Never used  °Substance and Sexual Activity  ° Alcohol use: Not Currently  °  Alcohol/week: 7.0 standard drinks  °  Types: 7 Glasses of wine per week  °  Comment: 0-2 mixed drinks a day  ° Drug use: No  ° Sexual activity: Not Currently  °  Birth control/protection: Post-menopausal  °Other Topics Concern  ° Not on file  °Social History Narrative  ° Not on file  ° °Social Determinants of Health  ° °Financial Resource Strain: Not on file  °Food Insecurity: Not on file  °Transportation Needs: Not on file  °Physical Activity: Not on file  °Stress: Not on file  °Social Connections: Not on file  °Intimate Partner   Violence: Not on file    FAMILY HISTORY: Family History  Problem Relation Age of Onset   Breast cancer Paternal Aunt 35   Diabetes Mother    Osteoporosis Mother    Hyperlipidemia Father    Rheumatic fever Father    Valvular heart disease Father    Cancer Paternal Grandmother        possible colon   Breast cancer Sister 56    ALLERGIES:  is allergic to penicillin g.  MEDICATIONS:  Current Outpatient Medications  Medication Sig Dispense Refill   Calcium Carbonate-Vit D-Min (CALCIUM 1200 PO) Take by mouth.     Cholecalciferol 25 MCG (1000 UT) tablet Take 2,000 Units by mouth daily.     CREON 24000-76000 units CPEP TAKE 1 CAPSULE (24,000 UNITS TOTAL) BY MOUTH 3 (THREE) TIMES DAILY BEFORE MEALS. 200 capsule 3   Cyanocobalamin (VITAMIN B 12 PO) Take by mouth.     Multiple Vitamins-Minerals  (MULTIVITAMIN WITH MINERALS) tablet Take 1 tablet by mouth daily.     simethicone (GAS-X) 80 MG chewable tablet Chew 1 tablet (80 mg total) by mouth every 8 (eight) hours as needed for flatulence. 60 tablet 0   gabapentin (NEURONTIN) 100 MG capsule Take 1 capsule (100 mg total) by mouth at bedtime. (Patient not taking: Reported on 07/20/2021) 30 capsule 1   magic mouthwash w/lidocaine SOLN Take 5 mLs by mouth 4 (four) times daily as needed for mouth pain. Sig: Swish & Spit 5-10 ml four times a day as needed. Dispense 480 ml. 1RF (Patient not taking: Reported on 12/17/2020) 480 mL 1   ondansetron (ZOFRAN-ODT) 4 MG disintegrating tablet Take 4 mg by mouth every 8 (eight) hours as needed. (Patient not taking: Reported on 08/24/2021)     No current facility-administered medications for this visit.   Facility-Administered Medications Ordered in Other Visits  Medication Dose Route Frequency Provider Last Rate Last Admin   sodium chloride flush (NS) 0.9 % injection 10 mL  10 mL Intravenous PRN [name], MD   10 mL at 02/18/21 0858   sodium chloride flush (NS) 0.9 % injection 10 mL  10 mL Intravenous Once Borders, Vonna Kotyk R, NP       sodium chloride flush (NS) 0.9 % injection 10 mL  10 mL Intravenous Once [name], MD         PHYSICAL EXAMINATION: ECOG PERFORMANCE STATUS: 0 - Asymptomatic Vitals:   08/24/21 0923  BP: 116/76  Pulse: (!) 58  Resp: 16  Temp: 97.6 F (36.4 C)  SpO2: 99%   Filed Weights   08/24/21 0923  Weight: 140 lb 1.6 oz (63.5 kg)    Physical Exam Constitutional:      General: She is not in acute distress. HENT:     Head: Normocephalic and atraumatic.  Eyes:     General: No scleral icterus. Cardiovascular:     Rate and Rhythm: Normal rate and regular rhythm.     Heart sounds: Normal heart sounds.  Pulmonary:     Effort: Pulmonary effort is normal. No respiratory distress.     Breath sounds: No wheezing.  Abdominal:     General: Bowel sounds are normal. There is no  distension.     Palpations: Abdomen is soft.  Musculoskeletal:        General: No deformity. Normal range of motion.     Cervical back: Normal range of motion and neck supple.  Skin:    General: Skin is warm and dry.  Coloration: Skin is not jaundiced.     Findings: No erythema or rash.  Neurological:     Mental Status: She is alert and oriented to person, place, and time. Mental status is at baseline.     Cranial Nerves: No cranial nerve deficit.     Coordination: Coordination normal.  Psychiatric:        Mood and Affect: Mood normal.    LABORATORY DATA:  I have reviewed the data as listed Lab Results  Component Value Date   WBC 5.0 08/24/2021   HGB 11.7 (L) 08/24/2021   HCT 35.5 (L) 08/24/2021   MCV 98.6 08/24/2021   PLT 133 (L) 08/24/2021   Recent Labs    07/27/21 0848 08/10/21 0816 08/24/21 0840  NA 136 137 137  K 3.9 4.2 4.2  CL 101 106 105  CO2 _0 GLUCOSE 120* 126* 114*  BUN _1 CREATININE 0.67 0.74 0.73  CALCIUM 8.9 8.4* 8.6*  GFRNONAA >60 >60 >60  PROT 6.4* 6.4* 6.5  ALBUMIN 3.9 3.7 3.9  AST 29 52* 37  ALT 30 41 29  ALKPHOS 117 116 97  BILITOT 0.6 <0.1* 0.2*    Iron/TIBC/Ferritin/ %Sat No results found for: IRON, TIBC, FERRITIN, IRONPCTSAT    RADIOGRAPHIC STUDIES: I have personally reviewed the radiological images as listed and agreed with the findings in the report. Reviewed findings of MRI abdomen [facility]CP done [facility]. MR 3D Recon At Scanner  Addendum Date: 05/29/2021   ADDENDUM REPORT: 05/29/2021 08:43 ADDENDUM: There is a subcapsular, nonenhancing lesion of the inferior tip of the right lobe of the liver, hepatic segment VII, adjacent to the gallbladder fossa, with internal fluid signal and no appreciable associated contrast enhancement measuring 3.4 x 2.3 cm. This appearance is generally not consistent with metastasis and is of uncertain significance, possibly reflecting a hepatic abscess in the setting of biliary obstruction or  residua of a subcapsular hematoma. Findings discussed with Dr. Tasia Catchings on 05/27/2021 and 05/29/2021. Electronically Signed   By: Delanna Ahmadi M.D.   On: 05/29/2021 08:43   Result Date: 05/29/2021 CLINICAL DATA:  Pancreatic cancer, elevated LFTs EXAM: MRI ABDOMEN WITHOUT AND WITH CONTRAST (INCLUDING MRCP) TECHNIQUE: Multiplanar multisequence MR imaging of the abdomen was performed both before and after the administration of intravenous contrast. Heavily T2-weighted images of the biliary and pancreatic ducts were obtained, and three-dimensional MRCP images were rendered by post processing. CONTRAST:  58m GADAVIST GADOBUTROL 1 MMOL/ML IV SOLN COMPARISON:  CT chest abdomen pelvis, 10/31/2020 FINDINGS: Lower chest: No acute findings. Hepatobiliary: Severe intra and extrahepatic biliary ductal dilatation, increased compared to prior CT. The common bile duct measures 1.8 cm, previously 1.1 cm. Common bile duct stent remains in position. Portal veins are patent. Pancreas: Unchanged post treatment appearance of pancreatic head soft tissue, although ill-defined measuring approximately 2.9 x 2.6 cm (series 14, image 40). Severe atrophy of the pancreatic parenchyma distally with prominence of the pancreatic duct. Spleen:  Within normal limits in size and appearance. Adrenals/Urinary Tract: No masses identified. No evidence of hydronephrosis. Stomach/Bowel: Visualized portions within the abdomen are unremarkable. Vascular/Lymphatic: No pathologically enlarged lymph nodes identified. No abdominal aortic aneurysm demonstrated. Other:  None. Musculoskeletal: No suspicious bone lesions identified. IMPRESSION: 1. Unchanged post treatment appearance of pancreatic head soft tissue, in keeping with primary pancreatic malignancy. 2. Severe intra and extrahepatic biliary ductal dilatation, increased compared to prior CT. Common bile duct stent remains in position, however patency is not established on this MR  examination. 3. No evidence of  lymphadenopathy or metastatic disease in the abdomen. Electronically Signed: By: Delanna Ahmadi M.D. On: 05/27/2021 13:31   MR ABDOMEN MRCP W WO CONTAST  Addendum Date: 05/29/2021   ADDENDUM REPORT: 05/29/2021 08:43 ADDENDUM: There is a subcapsular, nonenhancing lesion of the inferior tip of the right lobe of the liver, hepatic segment VII, adjacent to the gallbladder fossa, with internal fluid signal and no appreciable associated contrast enhancement measuring 3.4 x 2.3 cm. This appearance is generally not consistent with metastasis and is of uncertain significance, possibly reflecting a hepatic abscess in the setting of biliary obstruction or residua of a subcapsular hematoma. Findings discussed with Dr. Tasia Catchings on 05/27/2021 and 05/29/2021. Electronically Signed   By: Delanna Ahmadi M.D.   On: 05/29/2021 08:43   Result Date: 05/29/2021 CLINICAL DATA:  Pancreatic cancer, elevated LFTs EXAM: MRI ABDOMEN WITHOUT AND WITH CONTRAST (INCLUDING MRCP) TECHNIQUE: Multiplanar multisequence MR imaging of the abdomen was performed both before and after the administration of intravenous contrast. Heavily T2-weighted images of the biliary and pancreatic ducts were obtained, and three-dimensional MRCP images were rendered by post processing. CONTRAST:  67m GADAVIST GADOBUTROL 1 MMOL/ML IV SOLN COMPARISON:  CT chest abdomen pelvis, 10/31/2020 FINDINGS: Lower chest: No acute findings. Hepatobiliary: Severe intra and extrahepatic biliary ductal dilatation, increased compared to prior CT. The common bile duct measures 1.8 cm, previously 1.1 cm. Common bile duct stent remains in position. Portal veins are patent. Pancreas: Unchanged post treatment appearance of pancreatic head soft tissue, although ill-defined measuring approximately 2.9 x 2.6 cm (series 14, image 40). Severe atrophy of the pancreatic parenchyma distally with prominence of the pancreatic duct. Spleen:  Within normal limits in size and appearance. Adrenals/Urinary  Tract: No masses identified. No evidence of hydronephrosis. Stomach/Bowel: Visualized portions within the abdomen are unremarkable. Vascular/Lymphatic: No pathologically enlarged lymph nodes identified. No abdominal aortic aneurysm demonstrated. Other:  None. Musculoskeletal: No suspicious bone lesions identified. IMPRESSION: 1. Unchanged post treatment appearance of pancreatic head soft tissue, in keeping with primary pancreatic malignancy. 2. Severe intra and extrahepatic biliary ductal dilatation, increased compared to prior CT. Common bile duct stent remains in position, however patency is not established on this MR examination. 3. No evidence of lymphadenopathy or metastatic disease in the abdomen. Electronically Signed: By: ADelanna AhmadiM.D. On: 05/27/2021 13:31      ASSESSMENT & PLAN:  1. [place] pancreatic cancer ([place]   2. Encounter for antineoplastic chemotherapy   3. Chemotherapy-induced neuropathy ([facility]   4. Pancreatic insufficiency   5. 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  Pathology proved residual disease  Labs are reviewed and discussed with patient Proceed with cycle 3, day 15 gemcitabine/Abraxane.  Patient will follow-up next week for day 22 gemcitabine/Abraxane.  She will have week of 3/13 off and she has appointment with Dr. ZMariah Millingwith repeat surveillance imaging and discussion of possible surgery.  #Chemotherapy-induced neuropathy of fingertips and toes, grade 2.  Follow-up with neurology.  Not tolerating Cymbalta.  Dose reduced Abraxane, stable neuropathy symptoms.  #pancreatic insufficiency.  Continue Creon, continue 24000 units TID with meals.  Weight is  stable.  #Elevated CEA,-s/p colonoscopy  # Chemotherapy induced anemia,   All questions were answered. The  patient knows to call the clinic with any problems questions or concerns.  Return of visit: 1 week for cycle 3-day 22 gemcitabine and Abraxane.  [name], MD, PhD  08/24/2021

## 2021-08-24 NOTE — Progress Notes (Signed)
Treatment day. Has some clear sinus drainage from pollen. No sore throat or fever. Otherwise feeling well. No concerns.

## 2021-08-24 NOTE — Patient Instructions (Signed)
[facility] CANCER CTR AT [place]  Discharge Instructions: Thank you for choosing [place] to provide your oncology and hematology care.  If you have a lab appointment with the [place], please go directly to the [place] and check in at the registration area.  Wear comfortable clothing and clothing appropriate for easy access to any Portacath or PICC line.   We strive to give you quality time with your provider. You may need to reschedule your appointment if you arrive late (15 or more minutes).  Arriving late affects you and other patients whose appointments are after yours.  Also, if you miss three or more appointments without notifying the office, you may be dismissed from the clinic at the providers discretion.      For prescription refill requests, have your pharmacy contact our office and allow 72 hours for refills to be completed.    Today you received the following chemotherapy and/or immunotherapy agents Gemzar, Abraxane      To help prevent nausea and vomiting after your treatment, we encourage you to take your nausea medication as directed.  BELOW ARE SYMPTOMS THAT SHOULD BE REPORTED IMMEDIATELY: *FEVER GREATER THAN 100.4 F (38 C) OR HIGHER *CHILLS OR SWEATING *NAUSEA AND VOMITING THAT IS NOT CONTROLLED WITH YOUR NAUSEA MEDICATION *UNUSUAL SHORTNESS OF BREATH *UNUSUAL BRUISING OR BLEEDING *URINARY PROBLEMS (pain or burning when urinating, or frequent urination) *BOWEL PROBLEMS (unusual diarrhea, constipation, pain near the anus) TENDERNESS IN MOUTH AND THROAT WITH OR WITHOUT PRESENCE OF ULCERS (sore throat, sores in mouth, or a toothache) UNUSUAL RASH, SWELLING OR PAIN  UNUSUAL VAGINAL DISCHARGE OR ITCHING   Items with * indicate a potential emergency and should be followed up as soon as possible or go to the Emergency Department if any problems should occur.  Please show the CHEMOTHERAPY ALERT CARD or IMMUNOTHERAPY ALERT CARD at  check-in to the Emergency Department and triage nurse.  Should you have questions after your visit or need to cancel or reschedule your appointment, please contact [facility] CANCER [place] AT [place]  [phone] and follow the prompts.  Office hours are 8:00 a.m. to 4:30 p.m. Monday - Friday. Please note that voicemails left after 4:00 p.m. may not be returned until the following business day.  We are closed weekends and major holidays. You have access to a nurse at all times for urgent questions. Please call the main number to the clinic [phone] and follow the prompts.  For any non-urgent questions, you may also contact your provider using MyChart. We now offer e-Visits for anyone 27 and older to request care online for non-urgent symptoms. For details visit [email or web address].   Also download the MyChart app! Go to the app store, search "MyChart", open the app, select [place], and log in with your MyChart username and password.  Due to Covid, a mask is required upon entering the hospital/clinic. If you do not have a mask, one will be given to you upon arrival. For doctor visits, patients may have 1 support person aged 13 or older with them. For treatment visits, patients cannot have anyone with them due to current Covid guidelines and our immunocompromised population.

## 2021-08-25 LAB — CANCER ANTIGEN 19-9: CA 19-9: 6 U/mL (ref 0–35)

## 2021-09-01 ENCOUNTER — Inpatient Hospital Stay: Payer: Medicare Other

## 2021-09-01 ENCOUNTER — Inpatient Hospital Stay (HOSPITAL_BASED_OUTPATIENT_CLINIC_OR_DEPARTMENT_OTHER): Payer: Medicare Other | Admitting: Oncology

## 2021-09-01 ENCOUNTER — Encounter: Payer: Self-pay | Admitting: Oncology

## 2021-09-01 ENCOUNTER — Other Ambulatory Visit: Payer: Self-pay

## 2021-09-01 ENCOUNTER — Inpatient Hospital Stay: Payer: Medicare Other | Attending: Oncology

## 2021-09-01 VITALS — BP 111/80 | HR 62 | Temp 97.4°F | Resp 19 | Wt 140.8 lb

## 2021-09-01 DIAGNOSIS — C259 Malignant neoplasm of pancreas, unspecified: Secondary | ICD-10-CM | POA: Diagnosis present

## 2021-09-01 DIAGNOSIS — C787 Secondary malignant neoplasm of liver and intrahepatic bile duct: Secondary | ICD-10-CM | POA: Insufficient documentation

## 2021-09-01 DIAGNOSIS — Z5111 Encounter for antineoplastic chemotherapy: Secondary | ICD-10-CM | POA: Insufficient documentation

## 2021-09-01 DIAGNOSIS — K8689 Other specified diseases of pancreas: Secondary | ICD-10-CM

## 2021-09-01 DIAGNOSIS — G620 Drug-induced polyneuropathy: Secondary | ICD-10-CM

## 2021-09-01 DIAGNOSIS — T451X5A Adverse effect of antineoplastic and immunosuppressive drugs, initial encounter: Secondary | ICD-10-CM

## 2021-09-01 LAB — COMPREHENSIVE METABOLIC PANEL
ALT: 29 U/L (ref 0–44)
AST: 29 U/L (ref 15–41)
Albumin: 3.8 g/dL (ref 3.5–5.0)
Alkaline Phosphatase: 115 U/L (ref 38–126)
Anion gap: 8 (ref 5–15)
BUN: 15 mg/dL (ref 8–23)
CO2: 27 mmol/L (ref 22–32)
Calcium: 8.8 mg/dL — ABNORMAL LOW (ref 8.9–10.3)
Chloride: 104 mmol/L (ref 98–111)
Creatinine, Ser: 0.68 mg/dL (ref 0.44–1.00)
GFR, Estimated: >60 mL/min (ref >60)
Glucose, Bld: 106 mg/dL — ABNORMAL HIGH (ref 70–99)
Potassium: 4.1 mmol/L (ref 3.5–5.1)
Sodium: 139 mmol/L (ref 135–145)
Total Bilirubin: 0.5 mg/dL (ref 0.3–1.2)
Total Protein: 6.1 g/dL — ABNORMAL LOW (ref 6.5–8.1)

## 2021-09-01 LAB — CBC WITH DIFFERENTIAL/PLATELET
Abs Immature Granulocytes: 0.04 10*3/uL (ref 0.00–0.07)
Basophils Absolute: 0 10*3/uL (ref 0.0–0.1)
Basophils Relative: 0 %
Eosinophils Absolute: 0.1 10*3/uL (ref 0.0–0.5)
Eosinophils Relative: 2 %
HCT: 32.4 % — ABNORMAL LOW (ref 36.0–46.0)
Hemoglobin: 10.9 g/dL — ABNORMAL LOW (ref 12.0–15.0)
Immature Granulocytes: 1 %
Lymphocytes Relative: 28 %
Lymphs Abs: 1.6 10*3/uL (ref 0.7–4.0)
MCH: 33.1 pg (ref 26.0–34.0)
MCHC: 33.6 g/dL (ref 30.0–36.0)
MCV: 98.5 fL (ref 80.0–100.0)
Monocytes Absolute: 0.4 10*3/uL (ref 0.1–1.0)
Monocytes Relative: 8 %
Neutro Abs: 3.4 10*3/uL (ref 1.7–7.7)
Neutrophils Relative %: 61 %
Platelets: 131 10*3/uL — ABNORMAL LOW (ref 150–400)
RBC: 3.29 MIL/uL — ABNORMAL LOW (ref 3.87–5.11)
RDW: 14.1 % (ref 11.5–15.5)
WBC: 5.5 10*3/uL (ref 4.0–10.5)
nRBC: 0 % (ref 0.0–0.2)

## 2021-09-01 MED ORDER — HEPARIN SOD (PORK) LOCK FLUSH 100 UNIT/ML IV SOLN
INTRAVENOUS | Status: AC
  Filled 2021-09-01: qty 5

## 2021-09-01 MED ORDER — PROCHLORPERAZINE MALEATE 10 MG PO TABS
10.0000 mg | ORAL_TABLET | Freq: Once | ORAL | Status: AC
  Administered 2021-09-01: 10 mg via ORAL
  Filled 2021-09-01: qty 1

## 2021-09-01 MED ORDER — SODIUM CHLORIDE 0.9 % IV SOLN
1000.0000 mg/m2 | Freq: Once | INTRAVENOUS | Status: AC
  Administered 2021-09-01: 1748 mg via INTRAVENOUS
  Filled 2021-09-01: qty 45.97

## 2021-09-01 MED ORDER — SODIUM CHLORIDE 0.9 % IV SOLN
Freq: Once | INTRAVENOUS | Status: AC
  Filled 2021-09-01: qty 250

## 2021-09-01 MED ORDER — PACLITAXEL PROTEIN-BOUND CHEMO INJECTION 100 MG
100.0000 mg/m2 | Freq: Once | INTRAVENOUS | Status: AC
  Administered 2021-09-01: 175 mg via INTRAVENOUS
  Filled 2021-09-01: qty 35

## 2021-09-01 NOTE — Progress Notes (Signed)
Hematology/Oncology follow up note [phone] [phone]   Patient Care Team: [name], MD as PCP - General (Family Medicine) Flinchum, Kelby Aline, FNP (Family Medicine) [name], RN as Oncology Nurse Navigator [name], MD as Consulting Physician (Oncology)  REFERRING PROVIDER: [name] [name], MD  CHIEF COMPLAINTS/REASON FOR VISIT:  Follow up for treatment of pancreatic adenocarcinoma  HISTORY OF PRESENTING ILLNESS:   Cassandra [name] is a  66 y.o.  female with PMH listed below was seen in consultation at the request of  [name], MD  for evaluation of pancreatic adenocarcinoma Patient initially presented with jaundice, transaminitis, bilirubin was 9.9.  CA 19-9 was 1874.  Patient also reports unintentional weight loss. 02/08/2020 MRI abdomen and MRCP with and without contrast was done at [facility] which showed pancreatic head mass measuring up to 3 cm, with marked associated narrowing of the portal vein confluence.  SMA is preserved.  Marked intrahepatic and extrahepatic biliary duct dilatation as well as mild dilatation of the main pancreatic duct. Multiple hepatic masses highly concerning for metastatic disease.  Patient underwent EUS on 02/19/2020, which showed irregular mass identified in the pancreatic head, hypoechoic, measured 55mx33mm, sonographic evidence concerning for invasion into the superior mesenteric artery.  There is no sign of significant abnormality in the main pancreatic duct.  Dilatation of common bile duct which measured up to 16 mm.  Region of celiac artery was visualized and showed no signs of significant abnormality.  No lymphadenopathy.  FNA showed adenocarcinoma.  02/19/2020, ERCP, malignant.  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  The stricture was treated with placement of wall flex metal stent.  [facility] ZPia [facility]auand  was recommended for 3 drug regimen FOLFIRINOX.  [facility]  Patient was referred to establish care today. She denies any pain.  Since stent placement, skin jaundice has improved.  Itchiness has also improved. Patient was accompanied by her husband today.  She has a family history of breast cancer in sister and paternal aunt, colon cancer paternal grandmother.  #No reportable targetable mutation on NGS 9/14/2021cycle 1 FOLFIRINOX.  Patient received oxaliplatin and about 50% of Irinotecan on day 1 and had experienced neurologic symptoms.  She went to ER and working diagnosis is TIA, and eventually I think this is due to irinotecan side effects -irinotecan-associated dysarthria, lip/tongue numbness.  Adjustment was made for Irinotecan to be  infused over 180 minutes.  Atropine 0.5 mg once prior to the irinotecan. No recurrent symptoms.   # 03/19/2020-03/21/2020 patient was admitted due to sepsis with strep pneumonia bacteremia.  Patient was treated with IV Rocephin.  TEE was done which showed no vegetation.  No PFO or ASD.  Patient was discharged home and he finished full course of 14 days of IV Rocephin on 04/02/2020 per ID recommendation.  Repeat blood culture was also negative.  08/25/20 cycle 10 FOLFIRINOX 09/08/20- present  Starting cycle 11, FOLFIRI, Oxaliplatin discontinued due to neuropathy   #NGS showed no reportable targetable mutation #Genetic testing-[employer] diagnostic testing showed no pathological variants identified. MS stable, TMB 016m/mb, KRAS G12D, SF3B1 K700E, TP53 [id number]  #01/14/2021  [facility], [facility].  # [facility] [[place] 4~5 liver lesions on OSH MRI last year at the time of diagnosis highly suspicious for metastasis. She  not eligible for surgical protocol for metastasis resection given the number of liver metastasis is >3. However given her excellent  and durable response to chemo, surgical resection may be considered pending sustained disease control at 1 year and may require partial hepatectomy first to see if there is viable tumor before proceeding to Whipple resection ]  Patient had a COVID-19 infection in September 2022.  03/23/2021 CT abdomen pelvis No significant change in the ill-defined pancreatic head mass or  associated biliary ductal and pancreatic ductal dilation.  Slight interval enlargement of subcentimeter anterior peripancreatic  lymph node, nonspecific. Attention on follow-up per clinical protocol. Similar enlargement of the ascending thoracic aorta measuring 4.4 cm  03/23/2021 MRI abdomen w and wo  Increasing ill-defined hypoenhancement of the pancreatic head surrounding the common bile duct stent, in keeping with known pancreatic malignancy. Anterior peripancreatic lymph node is better evaluated on CT.  Unchanged position of a common bile duct stent with similar mild diffuse intrahepatic ductal dilation and pneumobilia. No evidence of metastatic disease in the abdomen or pelvis. Partially visualized cystic left adnexal lesion measuring up to 3.9 cm.   04/10/2021, patient underwent liver resection at [facility], by Dr. [name].   Pathology report from [place] was reviewed. Segments 3 partial hepatectomy, negative for viable tumor. Section 5/6 partial hepatectomy part 1 and 2, microscopic foci of residual viable adenocarcinoma, morphologically consistent with history of pancreatic primary.  Parenchymal margin is uninvolved.  #Patient resumed on FOLFIRI on 04/28/2021. She got another cycle on 05/12/2021 #05/26/2021, patient did not get additional chemotherapy due to transaminitis.  Shared decision was made to stop FOLFIRI and switch to gemcitabine Abraxane treatments.  05/26/2021, patient developed transaminitis, AST 763, ALT 691, alkaline phosphatase 364.  Bilirubin 0.7 05/26/2021 stat ultrasound abdomen right upper quadrant showed interval  development of 3.3 x 1.8 cm complex mass in the right hepatic lobe.  Increased extrahepatic ductal dilatation.  Concerning for CBD obstruction. 05/29/2021, MRI abdomen MRCP with and without contrast showed unchanged pancreatic head soft tissue.  Severe intra and extrahepatic biliary ductal dilatation.  Common bile duct stents remain in position however patency is not established.  No evidence of lymphadenopathy or metastatic disease in the abdomen.  With further communication with radiologist, addendum was added that there is internal fluid signal and no appreciable associated contrast enhancement measuring 3.4 x 2.3 cm.  This appearance is generally not consistent with metastasis and is of uncertain significance.  Possibly reflecting hepatic abscess or residual of subcapsular hematoma.  Patient was seen by [facility] Dr. Alwyn Pea team.  He had ERCP done on 06/05/2021, with findings of One stent from the biliary tree was seen in the major papilla. The stent had migrated significantly into the duodenum. This is the cause of stent malfunction. One stent was removed from the biliary tree. Prior biliary sphincterotomy appeared open. - A single severe biliary stricture was found in the lower third of the main bile duct with upstream dilation. The stricture was alignant appearing.- The biliary tree was swept and sludge was found.- One fully covered metal stent was placed into the common bile duct across the stricture.- No pancreatogram performed.  06/12/2021 Gemcitabine and Abraxane.   INTERVAL HISTORY Cassandra [name] is a 66 y.o. female who has above history reviewed by me today presents for pancreatic cancer.  Patient has an appointment with Dr. [name] next week. She reports feeling well.  Tolerating chemotherapy.  No new complaints.  No nausea vomiting diarrhea    Review of Systems  Constitutional:  Negative for appetite change, chills, fatigue, fever and unexpected weight change.  HENT:   Negative for hearing loss  and voice change.   Eyes:  Negative for eye problems.  Respiratory:  Negative for chest tightness and cough.   Cardiovascular:  Negative for chest pain.  Gastrointestinal:  Negative for abdominal distention, abdominal pain, blood in stool and nausea.  Endocrine: Negative for hot flashes.  Genitourinary:  Negative for difficulty urinating and frequency.   Musculoskeletal:  Negative for arthralgias.  Skin:  Negative for itching and rash.  Neurological:  Positive for numbness. Negative for extremity weakness.  Hematological:  Negative for adenopathy.  Psychiatric/Behavioral:  Negative for confusion.    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   Neutropenia ([place]) 04/18/2020   Osteopenia after menopause 05/2017   femoral neck T score -2.0   Personal history of chemotherapy    current for pancreatic ca   PONV (postoperative nausea and vomiting)    Pure hypercholesterolemia     SURGICAL HISTORY: Past Surgical History:  Procedure Laterality Date   COLONOSCOPY  07/2015   WNL   COLONOSCOPY  2008/2011   COLONOSCOPY WITH PROPOFOL N/A 07/10/2021   Procedure: COLONOSCOPY WITH PROPOFOL;  Surgeon: [name], MD;  Location: [facility] ENDOSCOPY;  Service: Endoscopy;  Laterality: N/A;   IR CV LINE INJECTION  04/28/2021   OVARIAN CYST REMOVAL  1992   dermoid-Dr CAK   PORTA CATH INSERTION N/A 03/07/2020   Procedure: PORTA CATH INSERTION;  Surgeon: Algernon Huxley, MD;  Location: [place] CV LAB;  Service: Cardiovascular;  Laterality: N/A;   TEE WITHOUT CARDIOVERSION N/A 03/21/2020   Procedure: TRANSESOPHAGEAL ECHOCARDIOGRAM (TEE);  Surgeon: Dionisio David, MD;  Location: [facility] ORS;  Service: Cardiovascular;  Laterality: N/A;   TUBAL LIGATION  1993    SOCIAL HISTORY: Social History   Socioeconomic History   Marital status:  Married    Spouse name: Not on file   Number of children: 2   Years of education: Not on file   Highest education level: Not on file  Occupational History   Occupation: Pharmacist, hospital    Comment: retired   Occupation: Psychologist, sport and exercise  Tobacco Use   Smoking status: Former    Packs/day: 1.00    Years: 12.00    Pack years: 12.00    Types: Cigarettes    Quit date: 1987    Years since quitting: 36.2   Smokeless tobacco: Never   Tobacco comments:    Quit smoking 1987  Vaping Use   Vaping Use: Never used  Substance and Sexual Activity   Alcohol use: Not Currently    Alcohol/week: 7.0 standard drinks    Types: 7 Glasses of wine per week    Comment: 0-2 mixed drinks a day   Drug use: No   Sexual activity: Not Currently    Birth control/protection: Post-menopausal  Other Topics Concern   Not on file  Social History Narrative   Not on file   Social Determinants of Health   Financial Resource Strain: Not on file  Food Insecurity: Not on file  Transportation Needs: Not on file  Physical Activity: Not on file  Stress: Not on file  Social Connections: Not on file  Intimate Partner Violence: Not on file  FAMILY HISTORY: Family History  Problem Relation Age of Onset   Breast cancer Paternal Aunt 48   Diabetes Mother    Osteoporosis Mother    Hyperlipidemia Father    Rheumatic fever Father    Valvular heart disease Father    Cancer Paternal Grandmother        possible colon   Breast cancer Sister 34    ALLERGIES:  is allergic to penicillin g.  MEDICATIONS:  Current Outpatient Medications  Medication Sig Dispense Refill   Calcium Carbonate-Vit D-Min (CALCIUM 1200 PO) Take by mouth.     Cholecalciferol 25 MCG (1000 UT) tablet Take 2,000 Units by mouth daily.     CREON 24000-76000 units CPEP TAKE 1 CAPSULE (24,000 UNITS TOTAL) BY MOUTH 3 (THREE) TIMES DAILY BEFORE MEALS. 200 capsule 3   Cyanocobalamin (VITAMIN B 12 PO) Take by mouth.     Multiple Vitamins-Minerals (MULTIVITAMIN WITH  MINERALS) tablet Take 1 tablet by mouth daily.     simethicone (GAS-X) 80 MG chewable tablet Chew 1 tablet (80 mg total) by mouth every 8 (eight) hours as needed for flatulence. 60 tablet 0   gabapentin (NEURONTIN) 100 MG capsule Take 1 capsule (100 mg total) by mouth at bedtime. (Patient not taking: Reported on 07/20/2021) 30 capsule 1   magic mouthwash w/lidocaine SOLN Take 5 mLs by mouth 4 (four) times daily as needed for mouth pain. Sig: Swish & Spit 5-10 ml four times a day as needed. Dispense 480 ml. 1RF (Patient not taking: Reported on 12/17/2020) 480 mL 1   ondansetron (ZOFRAN-ODT) 4 MG disintegrating tablet Take 4 mg by mouth every 8 (eight) hours as needed. (Patient not taking: Reported on 08/24/2021)     No current facility-administered medications for this visit.   Facility-Administered Medications Ordered in Other Visits  Medication Dose Route Frequency Provider Last Rate Last Admin   sodium chloride flush (NS) 0.9 % injection 10 mL  10 mL Intravenous PRN [name], MD   10 mL at 02/18/21 0858   sodium chloride flush (NS) 0.9 % injection 10 mL  10 mL Intravenous Once Borders, Kirt Boys, NP       sodium chloride flush (NS) 0.9 % injection 10 mL  10 mL Intravenous Once [name], MD         PHYSICAL EXAMINATION: ECOG PERFORMANCE STATUS: 0 - Asymptomatic Vitals:   09/01/21 0900  BP: 111/80  Pulse: 62  Resp: 19  Temp: (!) 97.4 F (36.3 C)  SpO2: 99%   Filed Weights   09/01/21 0900  Weight: 140 lb 12.8 oz (63.9 kg)    Physical Exam Constitutional:      General: She is not in acute distress. HENT:     Head: Normocephalic and atraumatic.  Eyes:     General: No scleral icterus. Cardiovascular:     Rate and Rhythm: Normal rate and regular rhythm.     Heart sounds: Normal heart sounds.  Pulmonary:     Effort: Pulmonary effort is normal. No respiratory distress.     Breath sounds: No wheezing.  Abdominal:     General: Bowel sounds are normal. There is no distension.      Palpations: Abdomen is soft.  Musculoskeletal:        General: No deformity. Normal range of motion.     Cervical back: Normal range of motion and neck supple.  Skin:    General: Skin is warm and dry.     Coloration: Skin is not jaundiced.  Findings: No erythema or rash.  Neurological:     Mental Status: She is alert and oriented to person, place, and time. Mental status is at baseline.     Cranial Nerves: No cranial nerve deficit.     Coordination: Coordination normal.  Psychiatric:        Mood and Affect: Mood normal.    LABORATORY DATA:  I have reviewed the data as listed Lab Results  Component Value Date   WBC 5.5 09/01/2021   HGB 10.9 (L) 09/01/2021   HCT 32.4 (L) 09/01/2021   MCV 98.5 09/01/2021   PLT 131 (L) 09/01/2021   Recent Labs    08/10/21 0816 08/24/21 0840 09/01/21 0849  NA 137 137 139  K 4.2 4.2 4.1  CL 106 105 104  CO2 _0 GLUCOSE 126* 114* 106*  BUN _1 CREATININE 0.74 0.73 0.68  CALCIUM 8.4* 8.6* 8.8*  GFRNONAA >60 >60 >60  PROT 6.4* 6.5 6.1*  ALBUMIN 3.7 3.9 3.8  AST 52* 37 29  ALT 41 29 29  ALKPHOS 116 97 115  BILITOT <0.1* 0.2* 0.5    Iron/TIBC/Ferritin/ %Sat No results found for: IRON, TIBC, FERRITIN, IRONPCTSAT    RADIOGRAPHIC STUDIES: I have personally reviewed the radiological images as listed and agreed with the findings in the report. Reviewed findings of MRI abdomen MRCP done at [facility]. No results found.    ASSESSMENT & PLAN:  1. 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 Primary pancreatic cancer ([place])   4. 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  Pathology proved residual disease   Labs reviewed and discussed with patient. Proceed with cycle 3, day 22 gemcitabine/Abraxane.    #Chemotherapy-induced neuropathy of fingertips and toes, grade 2.  Follow-up with neurology.  Not tolerating Cymbalta.  Dose reduced Abraxane, stable neuropathy symptoms.  #pancreatic insufficiency.  Continue Creon, continue 24000 units TID with meals.  Weight is stable.  #Elevated CEA,-s/p colonoscopy no significant findings. # Chemotherapy induced anemia, stable.  All questions were answered. The patient knows to call the clinic with any problems questions or concerns.  Return of visit: 2 weeks for lab MD gemcitabine Abraxane.  Patient may call back and cancel appointment if she has surgery planned.[name], MD, PhD  09/01/2021

## 2021-09-01 NOTE — Patient Instructions (Signed)
[facility] CANCER CTR AT [place]  Discharge Instructions: ?Thank you for choosing [facility] to provide your oncology and hematology care.  ?If you have a lab appointment with the [place], please go directly to the [place] and check in at the registration area. ? ?Wear comfortable clothing and clothing appropriate for easy access to any Portacath or PICC line.  ? ?We strive to give you quality time with your provider. You may need to reschedule your appointment if you arrive late (15 or more minutes).  Arriving late affects you and other patients whose appointments are after yours.  Also, if you miss three or more appointments without notifying the office, you may be dismissed from the clinic at the provider?s discretion.    ?  ?For prescription refill requests, have your pharmacy contact our office and allow 72 hours for refills to be completed.   ? ?Today you received the following chemotherapy and/or immunotherapy agents : Gemzar / Abraxane  ?  ?To help prevent nausea and vomiting after your treatment, we encourage you to take your nausea medication as directed. ? ?BELOW ARE SYMPTOMS THAT SHOULD BE REPORTED IMMEDIATELY: ?*FEVER GREATER THAN 100.4 F (38 ?C) OR HIGHER ?*CHILLS OR SWEATING ?*NAUSEA AND VOMITING THAT IS NOT CONTROLLED WITH YOUR NAUSEA MEDICATION ?*UNUSUAL SHORTNESS OF BREATH ?*UNUSUAL BRUISING OR BLEEDING ?*URINARY PROBLEMS (pain or burning when urinating, or frequent urination) ?*BOWEL PROBLEMS (unusual diarrhea, constipation, pain near the anus) ?TENDERNESS IN MOUTH AND THROAT WITH OR WITHOUT PRESENCE OF ULCERS (sore throat, sores in mouth, or a toothache) ?UNUSUAL RASH, SWELLING OR PAIN  ?UNUSUAL VAGINAL DISCHARGE OR ITCHING  ? ?Items with * indicate a potential emergency and should be followed up as soon as possible or go to the Emergency Department if any problems should occur. ? ?Please show the CHEMOTHERAPY ALERT CARD or IMMUNOTHERAPY ALERT CARD at  check-in to the Emergency Department and triage nurse. ? ?Should you have questions after your visit or need to cancel or reschedule your appointment, please contact [facility] CANCER [place] AT [place]  [phone] and follow the prompts.  Office hours are 8:00 a.m. to 4:30 p.m. Monday - Friday. Please note that voicemails left after 4:00 p.m. may not be returned until the following business day.  We are closed weekends and major holidays. You have access to a nurse at all times for urgent questions. Please call the main number to the clinic [phone] and follow the prompts. ? ?For any non-urgent questions, you may also contact your provider using MyChart. We now offer e-Visits for anyone 15 and older to request care online for non-urgent symptoms. For details visit [email or web address]. ?  ?Also download the MyChart app! Go to the app store, search "MyChart", open the app, select [place], and log in with your MyChart username and password. ? ?Due to Covid, a mask is required upon entering the hospital/clinic. If you do not have a mask, one will be given to you upon arrival. For doctor visits, patients may have 1 support person aged 5 or older with them. For treatment visits, patients cannot have anyone with them due to current Covid guidelines and our immunocompromised population.  ?

## 2021-09-04 ENCOUNTER — Encounter: Payer: Self-pay | Admitting: Oncology

## 2021-09-04 NOTE — Addendum Note (Signed)
Addended by: [name] on: 09/04/2021 09:32 PM ? ? Modules accepted: Orders ? ?

## 2021-09-09 NOTE — Progress Notes (Signed)
?  ? ?I,Joseline E Rosas,acting as a scribe for [employer], PA-C.,have documented all relevant documentation on the behalf of [place], PA-C,as directed by  [employer], PA-C while in the presence of Ellina Sivertsen E Porschia Willbanks, PA-C.  ? ?Established patient visit ? ? ?Patient: Cassandra [name]   DOB: 09-Apr-1956   66 y.o. Female  MRN: [id number] ?Visit Date: 09/10/2021 ? ?Today's healthcare provider: Dani Gobble Datrell Dunton, PA-C  ? ?Chief Complaint  ?Patient presents with  ? URI  ? ?Subjective  ?  ?URI  ?This is a new problem. The current episode started more than 1 month ago. The problem has been unchanged. There has been no fever. Associated symptoms include congestion, coughing, rhinorrhea and sneezing (" Flonase helping"). Pertinent negatives include no chest pain, diarrhea, headaches, nausea, sore throat, vomiting or wheezing. Associated symptoms comments: Post nasal drip ?Mostly morning and at night.. Treatments tried: Flonase. The treatment provided mild relief.   ? ? ?States this has been going on for a month and seems like allergies but this is not usual for her for it to last this long ?States she doesn't feel ill but is having a lot of clear drainage and congestion that is not resolving with Flonase use ?Flonase helps with sneezing but she continues to have post nasal drip  ? ?Medications: ?Outpatient Medications Prior to Visit  ?Medication Sig  ? Calcium Carbonate-Vit D-Min (CALCIUM 1200 PO) Take by mouth.  ? Cholecalciferol 25 MCG (1000 UT) tablet Take 2,000 Units by mouth daily.  ? CREON 24000-76000 units CPEP TAKE 1 CAPSULE (24,000 UNITS TOTAL) BY MOUTH 3 (THREE) TIMES DAILY BEFORE MEALS.  ? Cyanocobalamin (B-12 PO) Take by mouth.  ? Cyanocobalamin (VITAMIN B 12 PO) Take by mouth.  ? gabapentin (NEURONTIN) 100 MG capsule Take 1 capsule (100 mg total) by mouth at bedtime.  ? magic mouthwash w/lidocaine SOLN Take 5 mLs by mouth 4 (four) times daily as needed for mouth pain. Sig: Swish & Spit 5-10 ml four times a day as needed.  Dispense 480 ml. 1RF  ? Multiple Vitamins-Minerals (MULTIVITAMIN WITH MINERALS) tablet Take 1 tablet by mouth daily.  ? ondansetron (ZOFRAN-ODT) 4 MG disintegrating tablet Take 4 mg by mouth every 8 (eight) hours as needed.  ? simethicone (GAS-X) 80 MG chewable tablet Chew 1 tablet (80 mg total) by mouth every 8 (eight) hours as needed for flatulence.  ? ?Facility-Administered Medications Prior to Visit  ?Medication Dose Route Frequency Provider  ? sodium chloride flush (NS) 0.9 % injection 10 mL  10 mL Intravenous PRN [name], MD  ? sodium chloride flush (NS) 0.9 % injection 10 mL  10 mL Intravenous Once Borders, Kirt Boys, NP  ? sodium chloride flush (NS) 0.9 % injection 10 mL  10 mL Intravenous Once [name], MD  ? ? ?Review of Systems  ?Constitutional:  Negative for fatigue and fever.  ?HENT:  Positive for congestion, rhinorrhea and sneezing (" Flonase helping"). Negative for sore throat and trouble swallowing.   ?Respiratory:  Positive for cough. Negative for shortness of breath and wheezing.   ?Cardiovascular:  Negative for chest pain and palpitations.  ?Gastrointestinal:  Negative for diarrhea, nausea and vomiting.  ?Musculoskeletal:  Negative for arthralgias and myalgias.  ?Neurological:  Negative for dizziness, light-headedness and headaches.  ? ? ?  Objective  ?  ?BP 117/77 (BP Location: Left Arm, Patient Position: Sitting, Cuff Size: Normal)   Pulse 62   Temp (!) 97.2 ?F (36.2 ?C) (Temporal)  Resp 16   Ht '5\' 7"'$  (1.702 m)   Wt 142 lb 9.6 oz (64.7 kg)   BMI 22.33 kg/m?  ? ? ?Physical Exam ?Vitals reviewed.  ?Constitutional:   ?   Appearance: Normal appearance.  ?HENT:  ?   Head: Normocephalic and atraumatic.  ?   Nose: Nose normal.  ?   Right Turbinates: Enlarged and swollen. Not pale.  ?   Left Turbinates: Enlarged and swollen. Not pale.  ?   Mouth/Throat:  ?   Pharynx: Oropharynx is clear. Uvula midline. Posterior oropharyngeal erythema present. No pharyngeal swelling, oropharyngeal exudate or  uvula swelling.  ?   Comments: Mild cobblestoning noted in posterior pharynx ?Eyes:  ?   General: Lids are normal. No allergic shiner or scleral icterus. ?   Extraocular Movements: Extraocular movements intact.  ?   Right eye: Normal extraocular motion.  ?   Left eye: Normal extraocular motion.  ?   Conjunctiva/sclera: Conjunctivae normal.  ?Cardiovascular:  ?   Rate and Rhythm: Normal rate and regular rhythm.  ?   Pulses: Normal pulses.  ?   Heart sounds: Normal heart sounds.  ?Pulmonary:  ?   Effort: Pulmonary effort is normal.  ?   Breath sounds: Normal breath sounds. No decreased air movement. No decreased breath sounds, wheezing, rhonchi or rales.  ?Musculoskeletal:  ?   Right lower leg: No edema.  ?   Left lower leg: No edema.  ?Neurological:  ?   Mental Status: She is alert.  ?  ? ? ?No results found for any visits on 09/10/21. ? Assessment & Plan  ?  ? ?Problem List Items Addressed This Visit   ?None ?Visit Diagnoses   ? ? Allergic rhinitis, unspecified seasonality, unspecified trigger    -  Primary ?Acute, ongoing, appears stable but under-managed ?Patient reports she has not has seasonal allergies to her knowledge but has had similar symptoms with her chemotherapy treatments ?Reports using Flonase which provides mild relief of sneezing and reduced rhinorrhea but she is still having bothersome symptoms ?Recommend she try using a second generation antihistamine such as Zyrtec, Allegra, Claritin to assist with her symptoms ?Kidney function dose not require dosing adjustment per most recent lab results ?If this is not providing adequate relief we can discuss adding Montelukast as well.  ?Discussed general allergen reduction techniques such as washing sheets and clothing frequently, avoiding triggers and dust  ?Follow up as needed.   ? ?  ? ? ? ?No follow-ups on file. ? ? ?I, [place] Austad E Lissandro Dilorenzo, PA-C, have reviewed all documentation for this visit. The documentation on 09/10/21 for the exam, diagnosis, procedures,  and orders are all accurate and complete. ? ? ?Maziah Keeling, Glennie Isle MPH ?[place] ?[facility] ? ? ?No follow-ups on file.  ?   ? ?Alin Hutchins E Virgilene Stryker, PA-C  ?[place] ?[phone] (phone) ?[phone] (fax) ? ?[facility]  ?

## 2021-09-10 ENCOUNTER — Ambulatory Visit (INDEPENDENT_AMBULATORY_CARE_PROVIDER_SITE_OTHER): Payer: Medicare Other | Admitting: Physician Assistant

## 2021-09-10 ENCOUNTER — Other Ambulatory Visit: Payer: Self-pay

## 2021-09-10 ENCOUNTER — Encounter: Payer: Self-pay | Admitting: Physician Assistant

## 2021-09-10 VITALS — BP 117/77 | HR 62 | Temp 97.2°F | Resp 16 | Ht 67.0 in | Wt 142.6 lb

## 2021-09-10 DIAGNOSIS — J309 Allergic rhinitis, unspecified: Secondary | ICD-10-CM | POA: Diagnosis not present

## 2021-09-10 NOTE — Patient Instructions (Addendum)
At this time I recommend that you try a second generation antihistamine such as Zyrtec, Allegra or Claritin to assist with your symptoms ? ?You can continue your Flonase in addition to the antihistamine ? ?If these measures do not improve your symptoms let us know and we can consider adding a prescription to this.  ? ?Try to reduce exposure to potential triggers- wash clothing and sheets often with as hot of water as possible ?Reduce dust in your home with frequent cleaning ? ?

## 2021-09-15 ENCOUNTER — Ambulatory Visit: Payer: Medicare Other | Admitting: Oncology

## 2021-09-15 ENCOUNTER — Ambulatory Visit: Payer: Medicare Other

## 2021-09-15 ENCOUNTER — Other Ambulatory Visit: Payer: Medicare Other

## 2021-10-01 ENCOUNTER — Telehealth: Payer: Self-pay | Admitting: *Deleted

## 2021-10-01 NOTE — Telephone Encounter (Signed)
Call returned to patient and informed of doctor response, she thanked me for letting her know ?

## 2021-10-01 NOTE — Telephone Encounter (Signed)
Patient called reporting that she will be having surgery at [facility] 4/19 and they will be accessing her port for that. She s asking if she should come in next week for [place] to access her port to be sure that it is working properly since she states it has not been accessed for a while (09/01/21). Please advise ?

## 2021-10-01 NOTE — Telephone Encounter (Signed)
Per Dr. Tasia Catchings please let patient know that she doesn't need to have port accessed. Also, Dr. Tasia Catchings wishes her nothing but the best with her surgery.  ?

## 2021-10-08 ENCOUNTER — Ambulatory Visit (INDEPENDENT_AMBULATORY_CARE_PROVIDER_SITE_OTHER): Payer: Medicare Other

## 2021-10-08 VITALS — Wt 142.0 lb

## 2021-10-08 DIAGNOSIS — Z0000 Encounter for general adult medical examination without abnormal findings: Secondary | ICD-10-CM | POA: Diagnosis not present

## 2021-10-08 NOTE — Patient Instructions (Addendum)
Ms. Seybold , ?Thank you for taking time to come for your Medicare Wellness Visit. I appreciate your ongoing commitment to your health goals. Please review the following plan we discussed and let me know if I can assist you in the future.  ? ?Screening recommendations/referrals: ?Colonoscopy: 07/10/21 ?Mammogram: 12/31/20 ?Bone Density: 10/17/19 ?Recommended yearly ophthalmology/optometry visit for glaucoma screening and checkup ?Recommended yearly dental visit for hygiene and checkup ? ?Vaccinations: ?Influenza vaccine: n/d ?Pneumococcal vaccine: n/d ?Tdap vaccine: 05/12/17 ?Shingles vaccine: n/d   ?Covid-19:09/01/19, 09/29/19 ? ?Advanced directives: yes, requested copy ? ?Conditions/risks identified: none ? ?Next appointment: Follow up in one year for your annual wellness visit - 10/13/22 @ 9:15am by phone ? ? ?Preventive Care 66 Years and Older, Female ?Preventive care refers to lifestyle choices and visits with your health care provider that can promote health and wellness. ?What does preventive care include? ?A yearly physical exam. This is also called an annual well check. ?Dental exams once or twice a year. ?Routine eye exams. Ask your health care provider how often you should have your eyes checked. ?Personal lifestyle choices, including: ?Daily care of your teeth and gums. ?Regular physical activity. ?Eating a healthy diet. ?Avoiding tobacco and drug use. ?Limiting alcohol use. ?Practicing safe sex. ?Taking low-dose aspirin every day. ?Taking vitamin and mineral supplements as recommended by your health care provider. ?What happens during an annual well check? ?The services and screenings done by your health care provider during your annual well check will depend on your age, overall health, lifestyle risk factors, and family history of disease. ?Counseling  ?Your health care provider may ask you questions about your: ?Alcohol use. ?Tobacco use. ?Drug use. ?Emotional well-being. ?Home and relationship  well-being. ?Sexual activity. ?Eating habits. ?History of falls. ?Memory and ability to understand (cognition). ?Work and work Statistician. ?Reproductive health. ?Screening  ?You may have the following tests or measurements: ?Height, weight, and BMI. ?Blood pressure. ?Lipid and cholesterol levels. These may be checked every 5 years, or more frequently if you are over 72 years old. ?Skin check. ?Lung cancer screening. You may have this screening every year starting at age 55 if you have a 30-pack-year history of smoking and currently smoke or have quit within the past 15 years. ?Fecal occult blood test (FOBT) of the stool. You may have this test every year starting at age 85. ?Flexible sigmoidoscopy or colonoscopy. You may have a sigmoidoscopy every 5 years or a colonoscopy every 10 years starting at age 30. ?Hepatitis C blood test. ?Hepatitis B blood test. ?Sexually transmitted disease (STD) testing. ?Diabetes screening. This is done by checking your blood sugar (glucose) after you have not eaten for a while (fasting). You may have this done every 1-3 years. ?Bone density scan. This is done to screen for osteoporosis. You may have this done starting at age 76. ?Mammogram. This may be done every 1-2 years. Talk to your health care provider about how often you should have regular mammograms. ?Talk with your health care provider about your test results, treatment options, and if necessary, the need for more tests. ?Vaccines  ?Your health care provider may recommend certain vaccines, such as: ?Influenza vaccine. This is recommended every year. ?Tetanus, diphtheria, and acellular pertussis (Tdap, Td) vaccine. You may need a Td booster every 10 years. ?Zoster vaccine. You may need this after age 47. ?Pneumococcal 13-valent conjugate (PCV13) vaccine. One dose is recommended after age 65. ?Pneumococcal polysaccharide (PPSV23) vaccine. One dose is recommended after age 57. ?Talk to your health  care provider about which  screenings and vaccines you need and how often you need them. ?This information is not intended to replace advice given to you by your health care provider. Make sure you discuss any questions you have with your health care provider. ?Document Released: 07/11/2015 Document Revised: 03/03/2016 Document Reviewed: 04/15/2015 ?Elsevier Interactive Patient Education ? 2017 [place]. ? ?Fall Prevention in the Home ?Falls can cause injuries. They can happen to people of all ages. There are many things you can do to make your home safe and to help prevent falls. ?What can I do on the outside of my home? ?Regularly fix the edges of walkways and driveways and fix any cracks. ?Remove anything that might make you trip as you walk through a door, such as a raised step or threshold. ?Trim any bushes or trees on the path to your home. ?Use bright outdoor lighting. ?Clear any walking paths of anything that might make someone trip, such as rocks or tools. ?Regularly check to see if handrails are loose or broken. Make sure that both sides of any steps have handrails. ?Any raised decks and porches should have guardrails on the edges. ?Have any leaves, snow, or ice cleared regularly. ?Use sand or salt on walking paths during winter. ?Clean up any spills in your garage right away. This includes oil or grease spills. ?What can I do in the bathroom? ?Use night lights. ?Install grab bars by the toilet and in the tub and shower. Do not use towel bars as grab bars. ?Use non-skid mats or decals in the tub or shower. ?If you need to sit down in the shower, use a plastic, non-slip stool. ?Keep the floor dry. Clean up any water that spills on the floor as soon as it happens. ?Remove soap buildup in the tub or shower regularly. ?Attach bath mats securely with double-sided non-slip rug tape. ?Do not have throw rugs and other things on the floor that can make you trip. ?What can I do in the bedroom? ?Use night lights. ?Make sure that you have a  light by your bed that is easy to reach. ?Do not use any sheets or blankets that are too big for your bed. They should not hang down onto the floor. ?Have a firm chair that has side arms. You can use this for support while you get dressed. ?Do not have throw rugs and other things on the floor that can make you trip. ?What can I do in the kitchen? ?Clean up any spills right away. ?Avoid walking on wet floors. ?Keep items that you use a lot in easy-to-reach places. ?If you need to reach something above you, use a strong step stool that has a grab bar. ?Keep electrical cords out of the way. ?Do not use floor polish or wax that makes floors slippery. If you must use wax, use non-skid floor wax. ?Do not have throw rugs and other things on the floor that can make you trip. ?What can I do with my stairs? ?Do not leave any items on the stairs. ?Make sure that there are handrails on both sides of the stairs and use them. Fix handrails that are broken or loose. Make sure that handrails are as long as the stairways. ?Check any carpeting to make sure that it is firmly attached to the stairs. Fix any carpet that is loose or worn. ?Avoid having throw rugs at the top or bottom of the stairs. If you do have throw rugs, attach them to  the floor with carpet tape. ?Make sure that you have a light switch at the top of the stairs and the bottom of the stairs. If you do not have them, ask someone to add them for you. ?What else can I do to help prevent falls? ?Wear shoes that: ?Do not have high heels. ?Have rubber bottoms. ?Are comfortable and fit you well. ?Are closed at the toe. Do not wear sandals. ?If you use a stepladder: ?Make sure that it is fully opened. Do not climb a closed stepladder. ?Make sure that both sides of the stepladder are locked into place. ?Ask someone to hold it for you, if possible. ?Clearly mark and make sure that you can see: ?Any grab bars or handrails. ?First and last steps. ?Where the edge of each step  is. ?Use tools that help you move around (mobility aids) if they are needed. These include: ?Canes. ?Walkers. ?Scooters. ?Crutches. ?Turn on the lights when you go into a dark area. Replace any light bulbs as soon as they burn

## 2021-10-08 NOTE — Progress Notes (Signed)
?Virtual Visit via Telephone Note ? ?I connected with  Cassandra [name] on 10/08/21 at  9:45 AM EDT by telephone and verified that I am speaking with the correct person using two identifiers. ? ?Location: ?Patient: home ?Provider: [facility] ?Persons participating in the virtual visit: patient/Nurse Health Advisor ?  ?I discussed the limitations, risks, security and privacy concerns of performing an evaluation and management service by telephone and the availability of in person appointments. The patient expressed understanding and agreed to proceed. ? ?Interactive audio and video telecommunications were attempted between this nurse and patient, however failed, due to patient having technical difficulties OR patient did not have access to video capability.  We continued and completed visit with audio only. ? ?Some vital signs may be absent or patient reported.  ? ?Dionisio David, LPN ? ?Subjective:  ? Cassandra [name] is a 66 y.o. female who presents for an Initial Medicare Annual Wellness Visit. ? ?Review of Systems    ? ?  ? ?   ?Objective:  ?  ?There were no vitals filed for this visit. ?There is no height or weight on file to calculate BMI. ? ? ?  09/01/2021  ?  9:03 AM 08/24/2021  ?  9:22 AM 08/24/2021  ?  9:19 AM 08/24/2021  ?  9:18 AM 08/10/2021  ?  8:39 AM 07/20/2021  ?  8:46 AM 07/10/2021  ? 10:00 AM  ?Advanced Directives  ?Does Patient Have a Medical Advance Directive? Yes Yes  Yes Yes Yes   ?Type of Paramedic of [place];Living will [place];Living will [place];Living will  Living will;Healthcare Power of [place];Living will Healthcare Power of Attorney  ?Does patient want to make changes to medical advance directive? Yes (ED - Information included in AVS) No - Patient declined       ?Copy of [place] in Chart?  No - copy requested       ?Would patient like information on creating a medical advance directive? Yes  (ED - Information included in AVS)        ? ? ?Current Medications (verified) ?Outpatient Encounter Medications as of 10/08/2021  ?Medication Sig  ? clobetasol cream (TEMOVATE) 0.05 % once daily as needed  ? lidocaine-prilocaine (EMLA) cream APPLY TO AFFECTED AREA ONCE AS DIRECTED  ? prochlorperazine (COMPAZINE) 10 MG tablet TAKE 1 TABLET (10 MG TOTAL) BY MOUTH EVERY 6 (SIX) HOURS AS NEEDED (NAUSEA OR VOMITING).  ? Calcium Carbonate-Vit D-Min (CALCIUM 1200 PO) Take by mouth.  ? Cholecalciferol 25 MCG (1000 UT) tablet Take 2,000 Units by mouth daily.  ? CREON 24000-76000 units CPEP TAKE 1 CAPSULE (24,000 UNITS TOTAL) BY MOUTH 3 (THREE) TIMES DAILY BEFORE MEALS.  ? Cyanocobalamin (B-12 PO) Take by mouth.  ? Cyanocobalamin (VITAMIN B 12 PO) Take by mouth.  ? gabapentin (NEURONTIN) 100 MG capsule Take 1 capsule (100 mg total) by mouth at bedtime.  ? magic mouthwash w/lidocaine SOLN Take 5 mLs by mouth 4 (four) times daily as needed for mouth pain. Sig: Swish & Spit 5-10 ml four times a day as needed. Dispense 480 ml. 1RF  ? Multiple Vitamins-Minerals (MULTIVITAMIN WITH MINERALS) tablet Take 1 tablet by mouth daily.  ? ondansetron (ZOFRAN-ODT) 4 MG disintegrating tablet Take 4 mg by mouth every 8 (eight) hours as needed.  ? simethicone (GAS-X) 80 MG chewable tablet Chew 1 tablet (80 mg total) by mouth every 8 (eight) hours as needed  for flatulence.  ? [DISCONTINUED] prochlorperazine (COMPAZINE) 10 MG tablet Take 1 tablet (10 mg total) by mouth every 6 (six) hours as needed (Nausea or vomiting).  ? ?Facility-Administered Encounter Medications as of 10/08/2021  ?Medication  ? sodium chloride flush (NS) 0.9 % injection 10 mL  ? sodium chloride flush (NS) 0.9 % injection 10 mL  ? sodium chloride flush (NS) 0.9 % injection 10 mL  ? ? ?Allergies (verified) ?Penicillin g  ? ?History: ?Past Medical History:  ?Diagnosis Date  ? Anemia   ? Cancer [facility])   ? pancreatic cancer  ? Colon polyps   ? Family history of breast cancer   ?  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 Neutropenia ([place]) 04/18/2020  ? Osteopenia after menopause 05/2017  ? femoral neck T score -2.0  ? Personal history of chemotherapy   ? current for pancreatic ca  ? PONV (postoperative nausea and vomiting)   ? Pure hypercholesterolemia   ? ?Past Surgical History:  ?Procedure Laterality Date  ? COLONOSCOPY  07/2015  ? WNL  ? COLONOSCOPY  2008/2011  ? COLONOSCOPY WITH PROPOFOL N/A 07/10/2021  ? Procedure: COLONOSCOPY WITH PROPOFOL;  Surgeon: [name], MD;  Location: [facility] ENDOSCOPY;  Service: Endoscopy;  Laterality: N/A;  ? IR CV LINE INJECTION  04/28/2021  ? OVARIAN CYST REMOVAL  1992  ? dermoid-Dr CAK  ? PORTA CATH INSERTION N/A 03/07/2020  ? Procedure: PORTA CATH INSERTION;  Surgeon: Algernon Huxley, MD;  Location: [place] CV LAB;  Service: Cardiovascular;  Laterality: N/A;  ? TEE WITHOUT CARDIOVERSION N/A 03/21/2020  ? Procedure: TRANSESOPHAGEAL ECHOCARDIOGRAM (TEE);  Surgeon: Dionisio David, MD;  Location: [facility] ORS;  Service: Cardiovascular;  Laterality: N/A;  ? [place]  ? ?Family History  ?Problem Relation Age of Onset  ? Breast cancer Paternal Aunt 42  ? Diabetes Mother   ? Osteoporosis Mother   ? Hyperlipidemia Father   ? Rheumatic fever Father   ? Valvular heart disease Father   ? Cancer Paternal Grandmother   ?     possible colon  ? Breast cancer Sister 47  ? ?Social History  ? ?Socioeconomic History  ? Marital status: Married  ?  Spouse name: Not on file  ? Number of children: 2  ? Years of education: Not on file  ? Highest education level: Not on file  ?Occupational History  ? Occupation: Pharmacist, hospital  ?  Comment: retired  ? Occupation: [employer]  ?Tobacco Use  ? Smoking status: Former  ?  Packs/day: 1.00  ?  Years: 12.00  ?  Pack years: 12.00  ?  Types: Cigarettes  ?  Quit date: 24  ?  Years since quitting: 36.3  ? Smokeless tobacco: Never  ? Tobacco comments:  ?  Quit smoking 1987  ?Vaping Use  ? Vaping Use: Never used  ?Substance and  Sexual Activity  ? Alcohol use: Not Currently  ?  Alcohol/week: 7.0 standard drinks  ?  Types: 7 Glasses of wine per week  ?  Comment: 0-2 mixed drinks a day  ? Drug use: No  ? Sexual activity: Not Currently  ?  Birth control/protection: Post-menopausal  ?Other Topics Concern  ? Not on file  ?Social History Narrative  ? Not on file  ? ?Social Determinants of Health  ? ?Financial Resource Strain: Not on file  ?Food Insecurity: Not on file  ?Transportation Needs: Not on file  ?Physical Activity: Not on file  ?  Stress: Not on file  ?Social Connections: Not on file  ? ? ?Tobacco Counseling ?Counseling given: Not Answered ?Tobacco comments: Quit smoking 1987 ? ? ?Clinical Intake: ? ?Pre-visit preparation completed: Yes ? ?Pain : No/denies pain ? ?  ? ?Nutritional Risks: None ?Diabetes: No ? ?How often do you need to have someone help you when you read instructions, pamphlets, or other written materials from your doctor or pharmacy?: 1 - Never ? ?Diabetic?no ? ?Interpreter Needed?: No ? ?Information entered by :: Kirke Shaggy LPN ? ? ?Activities of Daily Living ? ?  10/06/2021  ?  8:18 AM 09/10/2021  ?  9:34 AM  ?In your present state of health, do you have any difficulty performing the following activities:  ?Hearing? 0 0  ?Vision? 0 0  ?Difficulty concentrating or making decisions? 0 0  ?Walking or climbing stairs? 0 0  ?Dressing or bathing? 0 0  ?Doing errands, shopping? 0 0  ?Preparing Food and eating ? N   ?Using the Toilet? N   ?In the past six months, have you accidently leaked urine? N   ?Do you have problems with loss of bowel control? N   ?Managing your Medications? N   ?Managing your Finances? N   ?Housekeeping or managing your Housekeeping? N   ? ? ?Patient Care Team: ?[name], MD as PCP - General (Family Medicine) ?Flinchum, Kelby Aline, FNP (Family Medicine) ?[name], RN as Oncology Nurse Navigator ?[name], MD as Consulting Physician (Oncology) ? ?Indicate any recent Medical Services  you may have received from other than [facility] providers in the past year (date may be approximate). ? ?   ?Assessment:  ? This is a routine wellness examination for [employer]. ? ?Hearing/Vision screen ?No results fou

## 2021-11-02 ENCOUNTER — Telehealth: Payer: Self-pay

## 2021-11-02 NOTE — Telephone Encounter (Signed)
Patient was discharged from [facility] on 5/2. Please advise on follow up.  ?

## 2021-11-03 ENCOUNTER — Encounter: Payer: Self-pay | Admitting: Oncology

## 2021-11-03 NOTE — Telephone Encounter (Signed)
Sounds good. Thanks 

## 2021-11-09 ENCOUNTER — Inpatient Hospital Stay: Payer: Medicare Other | Attending: Oncology | Admitting: Oncology

## 2021-11-09 ENCOUNTER — Encounter: Payer: Self-pay | Admitting: Oncology

## 2021-11-09 VITALS — BP 116/80 | HR 61 | Temp 97.6°F | Resp 18 | Wt 137.1 lb

## 2021-11-09 DIAGNOSIS — Z87891 Personal history of nicotine dependence: Secondary | ICD-10-CM | POA: Diagnosis not present

## 2021-11-09 DIAGNOSIS — Z5189 Encounter for other specified aftercare: Secondary | ICD-10-CM | POA: Diagnosis not present

## 2021-11-09 DIAGNOSIS — C787 Secondary malignant neoplasm of liver and intrahepatic bile duct: Secondary | ICD-10-CM | POA: Diagnosis present

## 2021-11-09 DIAGNOSIS — C250 Malignant neoplasm of head of pancreas: Secondary | ICD-10-CM | POA: Insufficient documentation

## 2021-11-09 DIAGNOSIS — T451X5A Adverse effect of antineoplastic and immunosuppressive drugs, initial encounter: Secondary | ICD-10-CM | POA: Diagnosis not present

## 2021-11-09 DIAGNOSIS — G620 Drug-induced polyneuropathy: Secondary | ICD-10-CM | POA: Diagnosis not present

## 2021-11-09 DIAGNOSIS — K8689 Other specified diseases of pancreas: Secondary | ICD-10-CM | POA: Insufficient documentation

## 2021-11-09 DIAGNOSIS — Z5111 Encounter for antineoplastic chemotherapy: Secondary | ICD-10-CM | POA: Diagnosis present

## 2021-11-09 DIAGNOSIS — C259 Malignant neoplasm of pancreas, unspecified: Secondary | ICD-10-CM

## 2021-11-09 NOTE — Progress Notes (Signed)
?Hematology/Oncology follow up note ?[phone] [phone] ? ? ?Patient Care Team: ?[name], MD as PCP - General (Family Medicine) ?Flinchum, Kelby Aline, FNP (Family Medicine) ?[name], RN as Oncology Nurse Navigator ?[name], MD as Consulting Physician (Oncology) ? ?REFERRING PROVIDER: ?[name], MD  ?CHIEF COMPLAINTS/REASON FOR VISIT:  ?Follow up for treatment of pancreatic adenocarcinoma ? ?HISTORY OF PRESENTING ILLNESS:  ? ?Cassandra [name] is a  66 y.o.  female with PMH listed below was seen in consultation at the request of  Bacigalupo, [name], MD  for evaluation of pancreatic adenocarcinoma ?Patient initially presented with jaundice, transaminitis, bilirubin was 9.9.  CA 19-9 was 1874.  Patient also reports unintentional weight loss. ?02/08/2020 MRI abdomen and MRCP with and without contrast was done at [facility] which showed pancreatic head mass measuring up to 3 cm, with marked associated narrowing of the portal vein confluence.  SMA is preserved.  Marked intrahepatic and extrahepatic biliary duct dilatation as well as mild dilatation of the main pancreatic duct. ?Multiple hepatic masses highly concerning for metastatic disease. ? ?Patient underwent EUS on 02/19/2020, which showed irregular mass identified in the pancreatic head, hypoechoic, measured 33mx33mm, sonographic evidence concerning for invasion into the superior mesenteric artery.  There is no sign of significant abnormality in the main pancreatic duct.  Dilatation of common bile duct which measured up to 16 mm.  Region of celiac artery was visualized and showed no signs of significant abnormality.  No lymphadenopathy.  FNA showed adenocarcinoma.  02/19/2020, ERCP, malignant.  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  The stricture was treated with placement of wall flex metal stent. ? ?[facility] ZPia Mauand  was recommended for 3 drug regimen FOLFIRINOX.  [facility]  Patient was referred to establish care today. ?She denies any pain.  Since stent placement, skin jaundice has improved.  Itchiness has also improved. ?Patient was accompanied by her husband today. ? ?She has a family history of breast cancer in sister and paternal aunt, colon cancer paternal grandmother. ? ?#No reportable targetable mutation on NGS ?9/14/2021cycle 1 FOLFIRINOX.  Patient received oxaliplatin and about 50% of Irinotecan on day 1 and had experienced neurologic symptoms.  She went to ER and working diagnosis is TIA, and eventually I think this is due to irinotecan side effects -irinotecan-associated dysarthria, lip/tongue numbness.  Adjustment was made for Irinotecan to be  infused over 180 minutes.  Atropine 0.5 mg once prior to the irinotecan. No recurrent symptoms.  ? ?# 03/19/2020-03/21/2020 patient was admitted due to sepsis with strep pneumonia bacteremia.  Patient was treated with IV Rocephin.  TEE was done which showed no vegetation.  No PFO or ASD.  Patient was discharged home and he finished full course of 14 days of IV Rocephin on 04/02/2020 per ID recommendation.  Repeat blood culture was also negative.  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 ? ?#NGS showed no reportable targetable mutation ?#Genetic testing-[employer] diagnostic testing showed no pathological variants identified. ?MS stable, TMB 063m/mb, KRAS G12D, SF3B1 K700E, TP53 [id number] ? ?#01/14/2021  [facility]. ? ?# Her case was [facility]. ?[There were 4~5 liver lesions on OSH MRI last year at the time of diagnosis highly suspicious for metastasis. She is  not eligible for surgical protocol for metastasis resection given the number of liver metastasis is >3. However given her excellent  and durable response to chemo, surgical resection may be considered pending sustained disease control at 1 year and may require partial hepatectomy first to see if there is viable tumor before proceeding to Whipple resection ] ? ?Patient had a COVID-19 infection in September 2022. ? ?03/23/2021 CT abdomen pelvis ?No significant change in the ill-defined pancreatic head mass or  associated biliary ductal and pancreatic ductal dilation.  Slight interval enlargement of subcentimeter anterior peripancreatic  ?lymph node, nonspecific. Attention on follow-up per clinical protocol. Similar enlargement of the ascending thoracic aorta measuring 4.4 cm ? ?03/23/2021 MRI abdomen w and wo ? Increasing ill-defined hypoenhancement of the pancreatic head surrounding the common bile duct stent, in keeping with known pancreatic malignancy. Anterior peripancreatic lymph node is better evaluated on CT.  Unchanged position of a common bile duct stent with similar mild diffuse intrahepatic ductal dilation and pneumobilia. No evidence of metastatic disease in the abdomen or pelvis. Partially visualized cystic left adnexal lesion measuring up to 3.9 cm.  ? ?04/10/2021, patient underwent liver [facility] at [facility], by Dr. [name].   ?Pathology report from [facility] was reviewed. ?Segments 3 partial hepatectomy, negative for viable tumor. ?Section 5/6 partial hepatectomy part 1 and 2, microscopic foci of residual viable adenocarcinoma, morphologically consistent with history of pancreatic primary.  Parenchymal margin is uninvolved. ? ?#Patient resumed on FOLFIRI on 04/28/2021. She got another cycle on 05/12/2021 ?#05/26/2021, patient did not get additional chemotherapy due to transaminitis.  Shared decision was made to stop FOLFIRI and switch to gemcitabine Abraxane treatments. ? ?05/26/2021, patient developed transaminitis, AST 763, ALT 691, alkaline phosphatase 364.  Bilirubin 0.7 ?05/26/2021 stat ultrasound abdomen right upper quadrant showed interval  development of 3.3 x 1.8 cm complex mass in the right hepatic lobe.  Increased extrahepatic ductal dilatation.  Concerning for CBD obstruction. ?05/29/2021, MRI abdomen MRCP with and without contrast showed unchanged pancreatic head soft tissue.  Severe intra and extrahepatic biliary ductal dilatation.  Common bile duct stents remain in position however patency is not established.  No evidence of lymphadenopathy or metastatic disease in the abdomen.  With further communication with radiologist, addendum was added that there is internal fluid signal and no appreciable associated contrast enhancement measuring 3.4 x 2.3 cm.  This appearance is generally not consistent with metastasis and is of uncertain significance.  Possibly reflecting hepatic abscess or residual of subcapsular hematoma. ? ?Patient was seen by [facility] Dr. Alwyn Pea team.  He had ERCP done on 06/05/2021, with findings of One stent from the biliary tree was seen in the major papilla. The stent had migrated significantly into the duodenum. This is the cause of stent malfunction. One stent was removed from the biliary tree. Prior biliary sphincterotomy appeared open. ?- A single severe biliary stricture was found in the lower third of the main bile duct with upstream dilation. The stricture was alignant appearing.- The biliary tree was swept and sludge was found.- One fully covered metal stent was placed into the common bile duct across the stricture.- No pancreatogram performed. ? ?06/12/2021 Gemcitabine and Abraxane.  ? ?INTERVAL HISTORY ?Cassandra [name] is a 66 y.o. female who has above history reviewed by me today presents for pancreatic cancer.  ?10/14/2021, patient underwent Whipple procedure. ?Liver biopsy negative for malignancy. ?Review procedure showed invasive adenocarcinoma, moderately differentiated, centered in pancreatic head and confined in the Pancreas. ?Surgical margin is negative for malignancy.  36 lymph nodes were all negative for malignancy.  1  hepatic artery lymph node was harvested and was negative.  Gallbladder negative for malignancy.  Cystic duct excision negative for malignancy. pT1b pN0 ? ?10/25/2021, CT abdomen pelvis with contrast s

## 2021-11-09 NOTE — Progress Notes (Signed)
Patient here for follow up. Had recent surgery and reports doing went well.  ?

## 2021-11-17 ENCOUNTER — Inpatient Hospital Stay (HOSPITAL_BASED_OUTPATIENT_CLINIC_OR_DEPARTMENT_OTHER): Payer: Medicare Other | Admitting: Oncology

## 2021-11-17 ENCOUNTER — Inpatient Hospital Stay: Payer: Medicare Other

## 2021-11-17 ENCOUNTER — Encounter: Payer: Self-pay | Admitting: Oncology

## 2021-11-17 VITALS — BP 131/84 | HR 58 | Temp 98.1°F | Wt 136.0 lb

## 2021-11-17 DIAGNOSIS — C259 Malignant neoplasm of pancreas, unspecified: Secondary | ICD-10-CM

## 2021-11-17 DIAGNOSIS — K8689 Other specified diseases of pancreas: Secondary | ICD-10-CM

## 2021-11-17 DIAGNOSIS — G620 Drug-induced polyneuropathy: Secondary | ICD-10-CM | POA: Diagnosis not present

## 2021-11-17 DIAGNOSIS — D6481 Anemia due to antineoplastic chemotherapy: Secondary | ICD-10-CM

## 2021-11-17 DIAGNOSIS — Z5111 Encounter for antineoplastic chemotherapy: Secondary | ICD-10-CM | POA: Diagnosis not present

## 2021-11-17 DIAGNOSIS — T451X5A Adverse effect of antineoplastic and immunosuppressive drugs, initial encounter: Secondary | ICD-10-CM

## 2021-11-17 LAB — COMPREHENSIVE METABOLIC PANEL
ALT: 25 U/L (ref 0–44)
AST: 28 U/L (ref 15–41)
Albumin: 3.9 g/dL (ref 3.5–5.0)
Alkaline Phosphatase: 71 U/L (ref 38–126)
Anion gap: 7 (ref 5–15)
BUN: 18 mg/dL (ref 8–23)
CO2: 26 mmol/L (ref 22–32)
Calcium: 8.6 mg/dL — ABNORMAL LOW (ref 8.9–10.3)
Chloride: 104 mmol/L (ref 98–111)
Creatinine, Ser: 0.62 mg/dL (ref 0.44–1.00)
GFR, Estimated: >60 mL/min (ref >60)
Glucose, Bld: 109 mg/dL — ABNORMAL HIGH (ref 70–99)
Potassium: 4.2 mmol/L (ref 3.5–5.1)
Sodium: 137 mmol/L (ref 135–145)
Total Bilirubin: 0.7 mg/dL (ref 0.3–1.2)
Total Protein: 7.1 g/dL (ref 6.5–8.1)

## 2021-11-17 LAB — CBC WITH DIFFERENTIAL/PLATELET
Abs Immature Granulocytes: 0.01 10*3/uL (ref 0.00–0.07)
Basophils Absolute: 0 10*3/uL (ref 0.0–0.1)
Basophils Relative: 1 %
Eosinophils Absolute: 0.2 10*3/uL (ref 0.0–0.5)
Eosinophils Relative: 4 %
HCT: 37.2 % (ref 36.0–46.0)
Hemoglobin: 12.5 g/dL (ref 12.0–15.0)
Immature Granulocytes: 0 %
Lymphocytes Relative: 34 %
Lymphs Abs: 1.6 10*3/uL (ref 0.7–4.0)
MCH: 31.8 pg (ref 26.0–34.0)
MCHC: 33.6 g/dL (ref 30.0–36.0)
MCV: 94.7 fL (ref 80.0–100.0)
Monocytes Absolute: 0.5 10*3/uL (ref 0.1–1.0)
Monocytes Relative: 10 %
Neutro Abs: 2.4 10*3/uL (ref 1.7–7.7)
Neutrophils Relative %: 51 %
Platelets: 173 10*3/uL (ref 150–400)
RBC: 3.93 MIL/uL (ref 3.87–5.11)
RDW: 12.5 % (ref 11.5–15.5)
WBC: 4.6 10*3/uL (ref 4.0–10.5)
nRBC: 0 % (ref 0.0–0.2)

## 2021-11-17 MED ORDER — HEPARIN SOD (PORK) LOCK FLUSH 100 UNIT/ML IV SOLN
500.0000 [IU] | Freq: Once | INTRAVENOUS | Status: AC
  Filled 2021-11-17: qty 5

## 2021-11-17 MED ORDER — SODIUM CHLORIDE 0.9 % IV SOLN
Freq: Once | INTRAVENOUS | Status: AC
  Filled 2021-11-17: qty 250

## 2021-11-17 MED ORDER — HEPARIN SOD (PORK) LOCK FLUSH 100 UNIT/ML IV SOLN
500.0000 [IU] | Freq: Once | INTRAVENOUS | Status: AC | PRN
  Filled 2021-11-17: qty 5

## 2021-11-17 MED ORDER — SODIUM CHLORIDE 0.9 % IV SOLN
1000.0000 mg/m2 | Freq: Once | INTRAVENOUS | Status: AC
  Administered 2021-11-17: 1748 mg via INTRAVENOUS
  Filled 2021-11-17: qty 45.97

## 2021-11-17 MED ORDER — PACLITAXEL PROTEIN-BOUND CHEMO INJECTION 100 MG
100.0000 mg/m2 | Freq: Once | INTRAVENOUS | Status: AC
  Administered 2021-11-17: 175 mg via INTRAVENOUS
  Filled 2021-11-17: qty 35

## 2021-11-17 MED ORDER — SODIUM CHLORIDE 0.9% FLUSH
10.0000 mL | Freq: Once | INTRAVENOUS | Status: AC
  Administered 2021-11-17: 10 mL via INTRAVENOUS
  Filled 2021-11-17: qty 10

## 2021-11-17 MED ORDER — HEPARIN SOD (PORK) LOCK FLUSH 100 UNIT/ML IV SOLN
INTRAVENOUS | Status: AC
  Administered 2021-11-17: 500 [IU]
  Filled 2021-11-17: qty 5

## 2021-11-17 MED ORDER — PROCHLORPERAZINE MALEATE 10 MG PO TABS
10.0000 mg | ORAL_TABLET | Freq: Once | ORAL | Status: AC
  Administered 2021-11-17: 10 mg via ORAL
  Filled 2021-11-17: qty 1

## 2021-11-17 NOTE — Progress Notes (Signed)
Hematology/Oncology Progress note [phone] [phone]      Patient Care Team: [name], MD as PCP - General (Family Medicine) Flinchum, Kelby Aline, FNP (Family Medicine) [name], RN as Oncology Nurse Navigator [name], MD as Consulting Physician (Oncology)  REFERRING PROVIDER: [name] [name], MD  CHIEF COMPLAINTS/REASON FOR VISIT:  Follow up for treatment of pancreatic adenocarcinoma  HISTORY OF PRESENTING ILLNESS:   Cassandra [name] is a  66 y.o.  female with PMH listed below was seen in consultation at the request of  [name], MD  for evaluation of pancreatic adenocarcinoma Patient initially presented with jaundice, transaminitis, bilirubin was 9.9.  CA 19-9 was 1874.  Patient also reports unintentional weight loss. 02/08/2020 MRI abdomen and MRCP with and without contrast was done at [facility] which showed pancreatic head mass measuring up to 3 cm, with marked associated narrowing of the portal vein confluence.  SMA is preserved.  Marked intrahepatic and extrahepatic biliary duct dilatation as well as mild dilatation of the main pancreatic duct. Multiple hepatic masses highly concerning for metastatic disease.  Patient underwent EUS on 02/19/2020, which showed irregular mass identified in the pancreatic head, hypoechoic, measured 96mx33mm, sonographic evidence concerning for invasion into the superior mesenteric artery.  There is no sign of significant abnormality in the main pancreatic duct.  Dilatation of common bile duct which measured up to 16 mm.  Region of celiac artery was visualized and showed no signs of significant abnormality.  No lymphadenopathy.  FNA showed adenocarcinoma.  02/19/2020, ERCP, malignant.  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  The stricture was treated with placement of wall flex metal stent.  [facility] and was recommended for 3 drug regimen FOLFIRINOX.  [facility]  Patient was referred to establish care today. She denies any pain.  Since stent placement, skin jaundice has improved.  Itchiness has also improved. Patient was accompanied by her husband today.  She has a family history of breast cancer in sister and paternal aunt, colon cancer paternal grandmother.  #No reportable targetable mutation on NGS 9/14/2021cycle 1 FOLFIRINOX.  Patient received oxaliplatin and about 50% of Irinotecan on day 1 and had experienced neurologic symptoms.  She went to ER and working diagnosis is TIA, and eventually I think this is due to irinotecan side effects -irinotecan-associated dysarthria, lip/tongue numbness.  Adjustment was made for Irinotecan to be  infused over 180 minutes.  Atropine 0.5 mg once prior to the irinotecan. No recurrent symptoms.   # 03/19/2020-03/21/2020 patient was admitted due to sepsis with strep pneumonia bacteremia.  Patient was treated with IV Rocephin.  TEE was done which showed no vegetation.  No PFO or ASD.  Patient was discharged home and he finished full course of 14 days of IV Rocephin on 04/02/2020 per ID recommendation.  Repeat blood culture was also negative.  08/25/20 cycle 10 FOLFIRINOX 09/08/20- present  Starting cycle 11, FOLFIRI, Oxaliplatin discontinued due to neuropathy   #NGS showed no reportable targetable mutation #Genetic testing-[employer] diagnostic testing showed no pathological variants identified. MS stable, TMB 012m/mb, KRAS G12D, SF3B1 K700E, TP53 [id number]  #01/14/2021  [facility].  # [facility] [[place] 4~5 liver lesions on OSH MRI last year at the time of diagnosis highly suspicious for metastasis.  She is not eligible for surgical protocol for metastasis resection given the number of liver metastasis is >3. However given her  excellent and durable response to chemo, surgical resection may be considered pending sustained disease control at 1 year and may require partial hepatectomy first to see if there is viable tumor before proceeding to Whipple resection ]  Patient had a COVID-19 infection in September 2022.  03/23/2021 CT abdomen pelvis No significant change in the ill-defined pancreatic head mass or  associated biliary ductal and pancreatic ductal dilation.  Slight interval enlargement of subcentimeter anterior peripancreatic  lymph node, nonspecific. Attention on follow-up per clinical protocol. Similar enlargement of the ascending thoracic aorta measuring 4.4 cm  03/23/2021 MRI abdomen w and wo  Increasing ill-defined hypoenhancement of the pancreatic head surrounding the common bile duct stent, in keeping with known pancreatic malignancy. Anterior peripancreatic lymph node is better evaluated on CT.  Unchanged position of a common bile duct stent with similar mild diffuse intrahepatic ductal dilation and pneumobilia. No evidence of metastatic disease in the abdomen or pelvis. Partially visualized cystic left adnexal lesion measuring up to 3.9 cm.   04/10/2021, patient underwent liver resection at [facility], by Dr. [name].   Pathology report from [place] was reviewed. Segments 3 partial hepatectomy, negative for viable tumor. Section 5/6 partial hepatectomy part 1 and 2, microscopic foci of residual viable adenocarcinoma, morphologically consistent with history of pancreatic primary.  Parenchymal margin is uninvolved.  #Patient resumed on FOLFIRI on 04/28/2021. She got another cycle on 05/12/2021 #05/26/2021, patient did not get additional chemotherapy due to transaminitis.  Shared decision was made to stop FOLFIRI and switch to gemcitabine Abraxane treatments.  05/26/2021, patient developed transaminitis, AST 763, ALT 691, alkaline phosphatase 364.  Bilirubin 0.7 05/26/2021 stat ultrasound abdomen right upper quadrant  showed interval development of 3.3 x 1.8 cm complex mass in the right hepatic lobe.  Increased extrahepatic ductal dilatation.  Concerning for CBD obstruction. 05/29/2021, MRI abdomen MRCP with and without contrast showed unchanged pancreatic head soft tissue.  Severe intra and extrahepatic biliary ductal dilatation.  Common bile duct stents remain in position however patency is not established.  No evidence of lymphadenopathy or metastatic disease in the abdomen.  With further communication with radiologist, addendum was added that there is internal fluid signal and no appreciable associated contrast enhancement measuring 3.4 x 2.3 cm.  This appearance is generally not consistent with metastasis and is of uncertain significance.  Possibly reflecting hepatic abscess or residual of subcapsular hematoma.  Patient was seen by [facility] Dr. Alwyn Pea team.  He had ERCP done on 06/05/2021, with findings of One stent from the biliary tree was seen in the major papilla. The stent had migrated significantly into the duodenum. This is the cause of stent malfunction. One stent was removed from the biliary tree. Prior biliary sphincterotomy appeared open. - A single severe biliary stricture was found in the lower third of the main bile duct with upstream dilation. The stricture was alignant appearing.- The biliary tree was swept and sludge was found.- One fully covered metal stent was placed into the common bile duct across the stricture.- No pancreatogram performed.  06/12/2021-08/10/2021 gemcitabine and Abraxane.   10/14/2021, patient underwent Whipple procedure. Liver biopsy negative for malignancy. Review procedure showed invasive adenocarcinoma, moderately differentiated, centered in pancreatic head and confined in the Pancreas. Surgical margin is negative for malignancy.  36 lymph nodes were all negative for malignancy.  1 hepatic artery lymph node was harvested and was negative.  Gallbladder negative for malignancy.   Cystic duct excision negative for malignancy. pT1b pN0  10/25/2021, CT abdomen pelvis with contrast showed rim-enhancing fluid collection in the region of the hepatic hilum. Patient had a readmission for superficial wound infection.  She completed antibiotics on 11/03/2021.  11/03/2021, CT abdomen pelvis with contrast showed near resolution of previously seen fluid collection in the region of the hepatic hilum.  Similar appearance of the area of hypoenhancement in the liver.  Attention on follow-up.  INTERVAL HISTORY Cassandra [name] is a 66 y.o. female who has above history reviewed by me today presents for pancreatic cancer.   She feels well today.  Denies any nausea vomiting diarrhea fever or chills. Chronic neuropathy symptoms, stable.  Numbness, no tingling or burning sensations.  Review of Systems  Constitutional:  Negative for appetite change, chills, fatigue, fever and unexpected weight change.  HENT:   Negative for hearing loss and voice change.   Eyes:  Negative for eye problems.  Respiratory:  Negative for chest tightness and cough.   Cardiovascular:  Negative for chest pain.  Gastrointestinal:  Negative for abdominal distention, abdominal pain, blood in stool and nausea.  Endocrine: Negative for hot flashes.  Genitourinary:  Negative for difficulty urinating and frequency.   Musculoskeletal:  Negative for arthralgias.  Skin:  Negative for itching and rash.  Neurological:  Positive for numbness. Negative for extremity weakness.  Hematological:  Negative for adenopathy.  Psychiatric/Behavioral:  Negative for confusion.    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   Neutropenia ([place]) 04/18/2020   Osteopenia after menopause 05/2017   femoral neck T score -2.0   Personal history of chemotherapy    current for pancreatic  ca   PONV (postoperative nausea and vomiting)    Pure hypercholesterolemia     SURGICAL HISTORY: Past Surgical History:  Procedure Laterality Date   COLONOSCOPY  07/2015   WNL   COLONOSCOPY  2008/2011   COLONOSCOPY WITH PROPOFOL N/A 07/10/2021   Procedure: COLONOSCOPY WITH PROPOFOL;  Surgeon: [name], MD;  Location: [facility] ENDOSCOPY;  Service: Endoscopy;  Laterality: N/A;   IR CV LINE INJECTION  04/28/2021   OVARIAN CYST REMOVAL  1992   dermoid-Dr CAK   PORTA CATH INSERTION N/A 03/07/2020   Procedure: PORTA CATH INSERTION;  Surgeon: Algernon Huxley, MD;  Location: [place] CV LAB;  Service: Cardiovascular;  Laterality: N/A;   TEE WITHOUT CARDIOVERSION N/A 03/21/2020   Procedure: TRANSESOPHAGEAL ECHOCARDIOGRAM (TEE);  Surgeon: Dionisio David, MD;  Location: [facility] ORS;  Service: Cardiovascular;  Laterality: N/A;   TUBAL LIGATION  1993    SOCIAL HISTORY: Social History   Socioeconomic History   Marital status: Married    Spouse name: Not on file   Number of children: 2   Years of education: Not on file   Highest education level: Not on file  Occupational History   Occupation: Teacher    Comment: retired   Occupation: Psychologist, sport and exercise  Tobacco Use   Smoking status: Former    Packs/day: 1.00    Years: 12.00    Pack years: 12.00    Types: Cigarettes    Quit date: 1987    Years since quitting: 36.4   Smokeless tobacco: Never   Tobacco comments:  Quit smoking 1987  Vaping Use   Vaping Use: Never used  Substance and Sexual Activity   Alcohol use: Not Currently    Alcohol/week: 7.0 standard drinks    Types: 7 Glasses of wine per week    Comment: 0-2 mixed drinks a day   Drug use: No   Sexual activity: Not Currently    Birth control/protection: Post-menopausal  Other Topics Concern   Not on file  Social History Narrative   Not on file   Social Determinants of Health   Financial Resource Strain: Low Risk    Difficulty of Paying Living Expenses: Not hard at all   Food Insecurity: No Food Insecurity   Worried About Charity fundraiser in the Last Year: Never true   Ran Out of Food in the Last Year: Never true  Transportation Needs: No Transportation Needs   Lack of Transportation (Medical): No   Lack of Transportation (Non-Medical): No  Physical Activity: Sufficiently Active   Days of Exercise per Week: 5 days   Minutes of Exercise per Session: 40 min  Stress: No Stress Concern Present   Feeling of Stress : Not at all  Social Connections: Moderately Isolated   Frequency of Communication with Friends and Family: More than three times a week   Frequency of Social Gatherings with Friends and Family: Three times a week   Attends Religious Services: Never   Active Member of Clubs or Organizations: No   Attends Music therapist: Never   Marital Status: Married  Human resources officer Violence: Not At Risk   Fear of Current or Ex-Partner: No   Emotionally Abused: No   Physically Abused: No   Sexually Abused: No    FAMILY HISTORY: Family History  Problem Relation Age of Onset   Breast cancer Paternal Aunt 71   Diabetes Mother    Osteoporosis Mother    Hyperlipidemia Father    Rheumatic fever Father    Valvular heart disease Father    Cancer Paternal Grandmother        possible colon   Breast cancer Sister 73    ALLERGIES:  is allergic to penicillin g.  MEDICATIONS:  Current Outpatient Medications  Medication Sig Dispense Refill   Calcium Carbonate-Vit D-Min (CALCIUM 1200 PO) Take by mouth.     Cholecalciferol 25 MCG (1000 UT) tablet Take 2,000 Units by mouth daily.     CREON 24000-76000 units CPEP TAKE 1 CAPSULE (24,000 UNITS TOTAL) BY MOUTH 3 (THREE) TIMES DAILY BEFORE MEALS. 200 capsule 3   Cyanocobalamin (B-12 PO) Take by mouth.     lidocaine-prilocaine (EMLA) cream APPLY TO AFFECTED AREA ONCE AS DIRECTED     Multiple Vitamins-Minerals (MULTIVITAMIN WITH MINERALS) tablet Take 1 tablet by mouth daily.     ondansetron  (ZOFRAN-ODT) 4 MG disintegrating tablet Take 4 mg by mouth every 8 (eight) hours as needed.     prochlorperazine (COMPAZINE) 10 MG tablet TAKE 1 TABLET (10 MG TOTAL) BY MOUTH EVERY 6 (SIX) HOURS AS NEEDED (NAUSEA OR VOMITING).     clobetasol cream (TEMOVATE) 0.05 % once daily as needed (Patient not taking: Reported on 11/09/2021)     simethicone (GAS-X) 80 MG chewable tablet Chew 1 tablet (80 mg total) by mouth every 8 (eight) hours as needed for flatulence. (Patient not taking: Reported on 11/09/2021) 60 tablet 0   No current facility-administered medications for this visit.   Facility-Administered Medications Ordered in Other Visits  Medication Dose Route Frequency Provider Last Rate Last Admin  sodium chloride flush (NS) 0.9 % injection 10 mL  10 mL Intravenous PRN [name], MD   10 mL at 02/18/21 0858   sodium chloride flush (NS) 0.9 % injection 10 mL  10 mL Intravenous Once Borders, Vonna Kotyk R, NP       sodium chloride flush (NS) 0.9 % injection 10 mL  10 mL Intravenous Once [name], MD         PHYSICAL EXAMINATION: ECOG PERFORMANCE STATUS: 0 - Asymptomatic Vitals:   11/17/21 0914  BP: 131/84  Pulse: (!) 58  Temp: 98.1 F (36.7 C)   Filed Weights   11/17/21 0914  Weight: 136 lb (61.7 kg)    Physical Exam Constitutional:      General: She is not in acute distress. HENT:     Head: Normocephalic and atraumatic.  Eyes:     General: No scleral icterus. Cardiovascular:     Rate and Rhythm: Normal rate and regular rhythm.     Heart sounds: Normal heart sounds.  Pulmonary:     Effort: Pulmonary effort is normal. No respiratory distress.     Breath sounds: No wheezing.  Abdominal:     General: Bowel sounds are normal. There is no distension.     Palpations: Abdomen is soft.  Musculoskeletal:        General: No deformity. Normal range of motion.     Cervical back: Normal range of motion and neck supple.  Skin:    General: Skin is warm and dry.     Coloration: Skin is not  jaundiced.     Findings: No erythema or rash.  Neurological:     Mental Status: She is alert and oriented to person, place, and time. Mental status is at baseline.     Cranial Nerves: No cranial nerve deficit.     Coordination: Coordination normal.  Psychiatric:        Mood and Affect: Mood normal.  Laparoscopic sites are clean without discharges.  LABORATORY DATA:  I have reviewed the data as listed Lab Results  Component Value Date   WBC 4.6 11/17/2021   HGB 12.5 11/17/2021   HCT 37.2 11/17/2021   MCV 94.7 11/17/2021   PLT 173 11/17/2021   Recent Labs    08/24/21 0840 09/01/21 0849 11/17/21 0852  NA 137 139 137  K 4.2 4.1 4.2  CL 105 104 104  CO2 _0 GLUCOSE 114* 106* 109*  BUN _1 CREATININE 0.73 0.68 0.62  CALCIUM 8.6* 8.8* 8.6*  GFRNONAA >60 >60 >60  PROT 6.5 6.1* 7.1  ALBUMIN 3.9 3.8 3.9  AST 37 29 28  ALT _2 ALKPHOS 97 115 71  BILITOT 0.2* 0.5 0.7    Iron/TIBC/Ferritin/ %Sat No results found for: IRON, TIBC, FERRITIN, IRONPCTSAT    RADIOGRAPHIC STUDIES: I have personally reviewed the radiological images as listed and agreed with the findings in the report. Reviewed findings of MRI abdomen MRCP done at [facility]. No results found.    ASSESSMENT & PLAN:  1. 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 Pancreatic insufficiency   4. Encounter for antineoplastic chemotherapy   5. 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  Pathology proved liver metastatic disease- additional chemotherapy --> whipple procedure.  ypT1b ypN0 Labs reviewed and discussed with  patient. Proceed with cycle 4-day 1 gemcitabine and Abraxane. Patient will proceed with day 8 treatment on 11/24/2021 if labs/vital signs meet treatment criteria.   #Chemotherapy-induced neuropathy of fingertips and toes, grade 2.  Not tolerating Cymbalta.  Stable symptoms. She feels some improvement with aquapunctures.    #pancreatic insufficiency, status post Whipple procedure..  Continue Creon, continue 24000 units TID with meals.    #Elevated CEA,-s/p colonoscopy no significant findings.  All questions were answered. The patient knows to call the clinic with any problems questions or concerns.  Return of visit: 1 week for lab  gemcitabine Abraxane.   2 weeks, lab MD gemcitabine Abraxane.    [name], MD, PhD  11/17/2021

## 2021-11-17 NOTE — Patient Instructions (Signed)
[facility] AT [place]  Discharge Instructions: Thank you for choosing [facility] to provide your oncology and hematology care.  If you have a lab appointment with the [facility], please go directly to the [facility] and check in at the registration area.  Wear comfortable clothing and clothing appropriate for easy access to any Portacath or PICC line.   We strive to give you quality time with your provider. You may need to reschedule your appointment if you arrive late (15 or more minutes).  Arriving late affects you and other patients whose appointments are after yours.  Also, if you miss three or more appointments without notifying the office, you may be dismissed from the clinic at the provider's discretion.      For prescription refill requests, have your pharmacy contact our office and allow 72 hours for refills to be completed.    Today you received the following chemotherapy and/or immunotherapy agents abraxane, gemzar   To help prevent nausea and vomiting after your treatment, we encourage you to take your nausea medication as directed.  BELOW ARE SYMPTOMS THAT SHOULD BE REPORTED IMMEDIATELY: *FEVER GREATER THAN 100.4 F (38 C) OR HIGHER *CHILLS OR SWEATING *NAUSEA AND VOMITING THAT IS NOT CONTROLLED WITH YOUR NAUSEA MEDICATION *UNUSUAL SHORTNESS OF BREATH *UNUSUAL BRUISING OR BLEEDING *URINARY PROBLEMS (pain or burning when urinating, or frequent urination) *BOWEL PROBLEMS (unusual diarrhea, constipation, pain near the anus) TENDERNESS IN MOUTH AND THROAT WITH OR WITHOUT PRESENCE OF ULCERS (sore throat, sores in mouth, or a toothache) UNUSUAL RASH, SWELLING OR PAIN  UNUSUAL VAGINAL DISCHARGE OR ITCHING   Items with * indicate a potential emergency and should be followed up as soon as possible or go to the Emergency Department if any problems should occur.  Please show the CHEMOTHERAPY ALERT CARD or IMMUNOTHERAPY ALERT CARD at check-in  to the Emergency Department and triage nurse.  Should you have questions after your visit or need to cancel or reschedule your appointment, please contact [facility] [place] AT [place]  [phone] and follow the prompts.  Office hours are 8:00 a.m. to 4:30 p.m. Monday - Friday. Please note that voicemails left after 4:00 p.m. may not be returned until the following business day.  We are closed weekends and major holidays. You have access to a nurse at all times for urgent questions. Please call the main number to the clinic [phone] and follow the prompts.  For any non-urgent questions, you may also contact your provider using MyChart. We now offer e-Visits for anyone 31 and older to request care online for non-urgent symptoms. For details visit [email or web address].   Also download the MyChart app! Go to the app store, search "MyChart", open the app, select [facility], and log in with your MyChart username and password.  Due to Covid, a mask is required upon entering the hospital/clinic. If you do not have a mask, one will be given to you upon arrival. For doctor visits, patients may have 1 support person aged 81 or older with them. For treatment visits, patients cannot have anyone with them due to current Covid guidelines and our immunocompromised population.

## 2021-11-18 LAB — CANCER ANTIGEN 19-9: CA 19-9: 7 U/mL (ref 0–35)

## 2021-11-24 ENCOUNTER — Other Ambulatory Visit: Payer: Self-pay | Admitting: Oncology

## 2021-11-24 ENCOUNTER — Ambulatory Visit: Payer: Medicare Other | Admitting: Oncology

## 2021-11-24 ENCOUNTER — Inpatient Hospital Stay: Payer: Medicare Other

## 2021-11-24 DIAGNOSIS — C259 Malignant neoplasm of pancreas, unspecified: Secondary | ICD-10-CM

## 2021-11-24 DIAGNOSIS — D709 Neutropenia, unspecified: Secondary | ICD-10-CM

## 2021-11-24 DIAGNOSIS — G620 Drug-induced polyneuropathy: Secondary | ICD-10-CM

## 2021-11-24 DIAGNOSIS — K8689 Other specified diseases of pancreas: Secondary | ICD-10-CM

## 2021-11-24 DIAGNOSIS — Z5111 Encounter for antineoplastic chemotherapy: Secondary | ICD-10-CM

## 2021-11-24 LAB — CBC WITH DIFFERENTIAL/PLATELET
Abs Immature Granulocytes: 0.01 10*3/uL (ref 0.00–0.07)
Basophils Absolute: 0 10*3/uL (ref 0.0–0.1)
Basophils Relative: 1 %
Eosinophils Absolute: 0.1 10*3/uL (ref 0.0–0.5)
Eosinophils Relative: 2 %
HCT: 35.3 % — ABNORMAL LOW (ref 36.0–46.0)
Hemoglobin: 11.9 g/dL — ABNORMAL LOW (ref 12.0–15.0)
Immature Granulocytes: 1 %
Lymphocytes Relative: 72 %
Lymphs Abs: 1.5 10*3/uL (ref 0.7–4.0)
MCH: 31.5 pg (ref 26.0–34.0)
MCHC: 33.7 g/dL (ref 30.0–36.0)
MCV: 93.4 fL (ref 80.0–100.0)
Monocytes Absolute: 0.1 10*3/uL (ref 0.1–1.0)
Monocytes Relative: 5 %
Neutro Abs: 0.4 10*3/uL — CL (ref 1.7–7.7)
Neutrophils Relative %: 19 %
Platelets: 116 10*3/uL — ABNORMAL LOW (ref 150–400)
RBC: 3.78 MIL/uL — ABNORMAL LOW (ref 3.87–5.11)
RDW: 12.2 % (ref 11.5–15.5)
WBC: 2.1 10*3/uL — ABNORMAL LOW (ref 4.0–10.5)
nRBC: 0 % (ref 0.0–0.2)

## 2021-11-24 LAB — COMPREHENSIVE METABOLIC PANEL
ALT: 32 U/L (ref 0–44)
AST: 39 U/L (ref 15–41)
Albumin: 4 g/dL (ref 3.5–5.0)
Alkaline Phosphatase: 73 U/L (ref 38–126)
Anion gap: 4 — ABNORMAL LOW (ref 5–15)
BUN: 13 mg/dL (ref 8–23)
CO2: 26 mmol/L (ref 22–32)
Calcium: 8.7 mg/dL — ABNORMAL LOW (ref 8.9–10.3)
Chloride: 109 mmol/L (ref 98–111)
Creatinine, Ser: 0.64 mg/dL (ref 0.44–1.00)
GFR, Estimated: >60 mL/min (ref >60)
Glucose, Bld: 115 mg/dL — ABNORMAL HIGH (ref 70–99)
Potassium: 4.2 mmol/L (ref 3.5–5.1)
Sodium: 139 mmol/L (ref 135–145)
Total Bilirubin: 0.2 mg/dL — ABNORMAL LOW (ref 0.3–1.2)
Total Protein: 7.1 g/dL (ref 6.5–8.1)

## 2021-11-24 MED ORDER — HEPARIN SOD (PORK) LOCK FLUSH 100 UNIT/ML IV SOLN
INTRAVENOUS | Status: AC
  Filled 2021-11-24: qty 5

## 2021-11-24 MED ORDER — FILGRASTIM-SNDZ 480 MCG/0.8ML IJ SOSY
480.0000 ug | PREFILLED_SYRINGE | Freq: Once | INTRAMUSCULAR | Status: AC
  Administered 2021-11-24: 480 ug via SUBCUTANEOUS
  Filled 2021-11-24: qty 0.8

## 2021-11-24 MED ORDER — SODIUM CHLORIDE 0.9% FLUSH
10.0000 mL | Freq: Once | INTRAVENOUS | Status: AC
  Administered 2021-11-24: 10 mL via INTRAVENOUS
  Filled 2021-11-24: qty 10

## 2021-11-25 ENCOUNTER — Telehealth: Payer: Self-pay

## 2021-11-25 ENCOUNTER — Inpatient Hospital Stay: Payer: Medicare Other

## 2021-11-25 LAB — CANCER ANTIGEN 19-9: CA 19-9: 7 U/mL (ref 0–35)

## 2021-11-25 NOTE — Telephone Encounter (Signed)
Per secure chat:  "Patient is here for Zarxio injection. She states that she received one yesterday and is having very bad back pain. She says that she took her claritin yesterday but it was several hours after her injection. She also took one this morning and is still having intense back pain. Do you still want her to receive her injection today?"   Per Dr. Tasia Catchings, advised to cancel injection today. Offered something for pain but patient declined, stating she will try her extra strength Tylenol. If no relief she will call back to let us know.

## 2021-11-26 ENCOUNTER — Telehealth: Payer: Self-pay | Admitting: Oncology

## 2021-11-26 NOTE — Telephone Encounter (Signed)
Ok to move to 6/5 if there is room.

## 2021-11-26 NOTE — Telephone Encounter (Signed)
Patient left VM requesting that treatment on 6/6 be moved to 6/5. Patient is scheduled to receive Gem/Abraxane that day.   Routing to team for guidance as it concerns a change of chemotherapy.

## 2021-11-27 NOTE — Telephone Encounter (Addendum)
Since appt was moved to 6/5. Injections will need to be moved to D2 (6/6) and D3 (6/7) please. D2 inj at least 24 hrs after tx.

## 2021-11-30 ENCOUNTER — Inpatient Hospital Stay: Payer: Medicare Other

## 2021-11-30 ENCOUNTER — Encounter: Payer: Self-pay | Admitting: Oncology

## 2021-11-30 ENCOUNTER — Inpatient Hospital Stay: Payer: Medicare Other | Attending: Oncology | Admitting: Oncology

## 2021-11-30 VITALS — BP 125/83 | HR 58 | Temp 97.3°F | Wt 135.0 lb

## 2021-11-30 DIAGNOSIS — G620 Drug-induced polyneuropathy: Secondary | ICD-10-CM | POA: Diagnosis not present

## 2021-11-30 DIAGNOSIS — C250 Malignant neoplasm of head of pancreas: Secondary | ICD-10-CM | POA: Diagnosis present

## 2021-11-30 DIAGNOSIS — Z95828 Presence of other vascular implants and grafts: Secondary | ICD-10-CM

## 2021-11-30 DIAGNOSIS — C259 Malignant neoplasm of pancreas, unspecified: Secondary | ICD-10-CM | POA: Diagnosis not present

## 2021-11-30 DIAGNOSIS — Z5111 Encounter for antineoplastic chemotherapy: Secondary | ICD-10-CM

## 2021-11-30 DIAGNOSIS — T451X5A Adverse effect of antineoplastic and immunosuppressive drugs, initial encounter: Secondary | ICD-10-CM

## 2021-11-30 DIAGNOSIS — C787 Secondary malignant neoplasm of liver and intrahepatic bile duct: Secondary | ICD-10-CM | POA: Diagnosis present

## 2021-11-30 DIAGNOSIS — D701 Agranulocytosis secondary to cancer chemotherapy: Secondary | ICD-10-CM

## 2021-11-30 DIAGNOSIS — K8689 Other specified diseases of pancreas: Secondary | ICD-10-CM | POA: Diagnosis not present

## 2021-11-30 LAB — COMPREHENSIVE METABOLIC PANEL
ALT: 31 U/L (ref 0–44)
AST: 38 U/L (ref 15–41)
Albumin: 3.9 g/dL (ref 3.5–5.0)
Alkaline Phosphatase: 89 U/L (ref 38–126)
Anion gap: 8 (ref 5–15)
BUN: 15 mg/dL (ref 8–23)
CO2: 26 mmol/L (ref 22–32)
Calcium: 8.8 mg/dL — ABNORMAL LOW (ref 8.9–10.3)
Chloride: 107 mmol/L (ref 98–111)
Creatinine, Ser: 0.65 mg/dL (ref 0.44–1.00)
GFR, Estimated: >60 mL/min (ref >60)
Glucose, Bld: 97 mg/dL (ref 70–99)
Potassium: 4.1 mmol/L (ref 3.5–5.1)
Sodium: 141 mmol/L (ref 135–145)
Total Bilirubin: 0.2 mg/dL — ABNORMAL LOW (ref 0.3–1.2)
Total Protein: 6.9 g/dL (ref 6.5–8.1)

## 2021-11-30 LAB — CBC WITH DIFFERENTIAL/PLATELET
Abs Immature Granulocytes: 0.05 10*3/uL (ref 0.00–0.07)
Basophils Absolute: 0 10*3/uL (ref 0.0–0.1)
Basophils Relative: 1 %
Eosinophils Absolute: 0.1 10*3/uL (ref 0.0–0.5)
Eosinophils Relative: 3 %
HCT: 36 % (ref 36.0–46.0)
Hemoglobin: 12 g/dL (ref 12.0–15.0)
Immature Granulocytes: 1 %
Lymphocytes Relative: 35 %
Lymphs Abs: 1.4 10*3/uL (ref 0.7–4.0)
MCH: 31.7 pg (ref 26.0–34.0)
MCHC: 33.3 g/dL (ref 30.0–36.0)
MCV: 95.2 fL (ref 80.0–100.0)
Monocytes Absolute: 0.6 10*3/uL (ref 0.1–1.0)
Monocytes Relative: 16 %
Neutro Abs: 1.7 10*3/uL (ref 1.7–7.7)
Neutrophils Relative %: 44 %
Platelets: 178 10*3/uL (ref 150–400)
RBC: 3.78 MIL/uL — ABNORMAL LOW (ref 3.87–5.11)
RDW: 13.5 % (ref 11.5–15.5)
WBC: 3.9 10*3/uL — ABNORMAL LOW (ref 4.0–10.5)
nRBC: 0 % (ref 0.0–0.2)

## 2021-11-30 MED ORDER — HEPARIN SOD (PORK) LOCK FLUSH 100 UNIT/ML IV SOLN
INTRAVENOUS | Status: AC
  Filled 2021-11-30: qty 5

## 2021-11-30 MED ORDER — HEPARIN SOD (PORK) LOCK FLUSH 100 UNIT/ML IV SOLN
500.0000 [IU] | Freq: Once | INTRAVENOUS | Status: DC | PRN
  Filled 2021-11-30: qty 5

## 2021-11-30 MED ORDER — PROCHLORPERAZINE MALEATE 10 MG PO TABS
ORAL_TABLET | ORAL | Status: AC
  Filled 2021-11-30: qty 1

## 2021-11-30 MED ORDER — PROCHLORPERAZINE MALEATE 10 MG PO TABS
10.0000 mg | ORAL_TABLET | Freq: Once | ORAL | Status: AC
  Administered 2021-11-30: 10 mg via ORAL

## 2021-11-30 MED ORDER — HEPARIN SOD (PORK) LOCK FLUSH 100 UNIT/ML IV SOLN
500.0000 [IU] | Freq: Once | INTRAVENOUS | Status: AC
  Administered 2021-11-30: 500 [IU] via INTRAVENOUS
  Filled 2021-11-30: qty 5

## 2021-11-30 MED ORDER — SODIUM CHLORIDE 0.9% FLUSH
10.0000 mL | INTRAVENOUS | Status: DC | PRN
  Administered 2021-11-30: 10 mL via INTRAVENOUS
  Filled 2021-11-30: qty 10

## 2021-11-30 MED ORDER — SODIUM CHLORIDE 0.9 % IV SOLN
Freq: Once | INTRAVENOUS | Status: AC
  Filled 2021-11-30: qty 250

## 2021-11-30 MED ORDER — SODIUM CHLORIDE 0.9 % IV SOLN
1000.0000 mg/m2 | Freq: Once | INTRAVENOUS | Status: AC
  Administered 2021-11-30: 1748 mg via INTRAVENOUS
  Filled 2021-11-30: qty 45.97

## 2021-11-30 MED ORDER — PACLITAXEL PROTEIN-BOUND CHEMO INJECTION 100 MG
100.0000 mg/m2 | Freq: Once | INTRAVENOUS | Status: AC
  Administered 2021-11-30: 175 mg via INTRAVENOUS
  Filled 2021-11-30: qty 35

## 2021-11-30 NOTE — Progress Notes (Signed)
Hematology/Oncology Progress note [phone] [phone]      Patient Care Team: [name], MD as PCP - General (Family Medicine) Flinchum, Kelby Aline, FNP (Family Medicine) [name], RN as Oncology Nurse Navigator [name], MD as Consulting Physician (Oncology)  REFERRING PROVIDER: [name] [name], MD  CHIEF COMPLAINTS/REASON FOR VISIT:  Follow up for treatment of pancreatic adenocarcinoma  HISTORY OF PRESENTING ILLNESS:   Cassandra [name] is a  66 y.o.  female with PMH listed below was seen in consultation at the request of  [name], MD  for evaluation of pancreatic adenocarcinoma Patient initially presented with jaundice, transaminitis, bilirubin was 9.9.  CA 19-9 was 1874.  Patient also reports unintentional weight loss. 02/08/2020 MRI abdomen and MRCP with and without contrast was done at [facility] which showed pancreatic head mass measuring up to 3 cm, with marked associated narrowing of the portal vein confluence.  SMA is preserved.  Marked intrahepatic and extrahepatic biliary duct dilatation as well as mild dilatation of the main pancreatic duct. Multiple hepatic masses highly concerning for metastatic disease.  Patient underwent EUS on 02/19/2020, which showed irregular mass identified in the pancreatic head, hypoechoic, measured 96mx33mm, sonographic evidence concerning for invasion into the superior mesenteric artery.  There is no sign of significant abnormality in the main pancreatic duct.  Dilatation of common bile duct which measured up to 16 mm.  Region of celiac artery was visualized and showed no signs of significant abnormality.  No lymphadenopathy.  FNA showed adenocarcinoma.  02/19/2020, ERCP, malignant.  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  The stricture was treated with placement of wall flex metal stent.  [facility] and was recommended for 3 drug regimen FOLFIRINOX.  [facility]  Patient was referred to establish care today. She denies any pain.  Since stent placement, skin jaundice has improved.  Itchiness has also improved. Patient was accompanied by her husband today.  She has a family history of breast cancer in sister and paternal aunt, colon cancer paternal grandmother.  #No reportable targetable mutation on NGS 9/14/2021cycle 1 FOLFIRINOX.  Patient received oxaliplatin and about 50% of Irinotecan on day 1 and had experienced neurologic symptoms.  She went to ER and working diagnosis is TIA, and eventually I think this is due to irinotecan side effects -irinotecan-associated dysarthria, lip/tongue numbness.  Adjustment was made for Irinotecan to be  infused over 180 minutes.  Atropine 0.5 mg once prior to the irinotecan. No recurrent symptoms.   # 03/19/2020-03/21/2020 patient was admitted due to sepsis with strep pneumonia bacteremia.  Patient was treated with IV Rocephin.  TEE was done which showed no vegetation.  No PFO or ASD.  Patient was discharged home and he finished full course of 14 days of IV Rocephin on 04/02/2020 per ID recommendation.  Repeat blood culture was also negative.  08/25/20 cycle 10 FOLFIRINOX 09/08/20- present  Starting cycle 11, FOLFIRI, Oxaliplatin discontinued due to neuropathy   #NGS showed no reportable targetable mutation #Genetic testing-[employer] diagnostic testing showed no pathological variants identified. MS stable, TMB 012m/mb, KRAS G12D, SF3B1 K700E, TP53 [id number]  #01/14/2021  [facility].  # [facility] [[place] Scrape[place] 4~5 liver lesions on OSH MRI last year at the time of diagnosis highly suspicious for metastasis.  She is not eligible for surgical protocol for metastasis resection given the number of liver metastasis is >3. However given her  excellent and durable response to chemo, surgical resection may be considered pending sustained disease control at 1 year and may require partial hepatectomy first to see if there is viable tumor before proceeding to Whipple resection ]  Patient had a COVID-19 infection in September 2022.  03/23/2021 CT abdomen pelvis No significant change in the ill-defined pancreatic head mass or  associated biliary ductal and pancreatic ductal dilation.  Slight interval enlargement of subcentimeter anterior peripancreatic  lymph node, nonspecific. Attention on follow-up per clinical protocol. Similar enlargement of the ascending thoracic aorta measuring 4.4 cm  03/23/2021 MRI abdomen w and wo  Increasing ill-defined hypoenhancement of the pancreatic head surrounding the common bile duct stent, in keeping with known pancreatic malignancy. Anterior peripancreatic lymph node is better evaluated on CT.  Unchanged position of a common bile duct stent with similar mild diffuse intrahepatic ductal dilation and pneumobilia. No evidence of metastatic disease in the abdomen or pelvis. Partially visualized cystic left adnexal lesion measuring up to 3.9 cm.   04/10/2021, patient underwent liver resection at [facility], by Dr. [name].   Pathology report from [place] was reviewed. Segments 3 partial hepatectomy, negative for viable tumor. Section 5/6 partial hepatectomy part 1 and 2, microscopic foci of residual viable adenocarcinoma, morphologically consistent with history of pancreatic primary.  Parenchymal margin is uninvolved.  #Patient resumed on FOLFIRI on 04/28/2021. She got another cycle on 05/12/2021 #05/26/2021, patient did not get additional chemotherapy due to transaminitis.  Shared decision was made to stop FOLFIRI and switch to gemcitabine Abraxane treatments.  05/26/2021, patient developed transaminitis, AST 763, ALT 691, alkaline phosphatase 364.  Bilirubin 0.7 05/26/2021 stat ultrasound abdomen right upper quadrant  showed interval development of 3.3 x 1.8 cm complex mass in the right hepatic lobe.  Increased extrahepatic ductal dilatation.  Concerning for CBD obstruction. 05/29/2021, MRI abdomen MRCP with and without contrast showed unchanged pancreatic head soft tissue.  Severe intra and extrahepatic biliary ductal dilatation.  Common bile duct stents remain in position however patency is not established.  No evidence of lymphadenopathy or metastatic disease in the abdomen.  With further communication with radiologist, addendum was added that there is internal fluid signal and no appreciable associated contrast enhancement measuring 3.4 x 2.3 cm.  This appearance is generally not consistent with metastasis and is of uncertain significance.  Possibly reflecting hepatic abscess or residual of subcapsular hematoma.  Patient was seen by [facility] Dr. Alwyn Pea team.  He had ERCP done on 06/05/2021, with findings of One stent from the biliary tree was seen in the major papilla. The stent had migrated significantly into the duodenum. This is the cause of stent malfunction. One stent was removed from the biliary tree. Prior biliary sphincterotomy appeared open. - A single severe biliary stricture was found in the lower third of the main bile duct with upstream dilation. The stricture was alignant appearing.- The biliary tree was swept and sludge was found.- One fully covered metal stent was placed into the common bile duct across the stricture.- No pancreatogram performed.  06/12/2021-08/10/2021 gemcitabine and Abraxane.   10/14/2021, patient underwent Whipple procedure. Liver biopsy negative for malignancy. Review procedure showed invasive adenocarcinoma, moderately differentiated, centered in pancreatic head and confined in the Pancreas. Surgical margin is negative for malignancy.  36 lymph nodes were all negative for malignancy.  1 hepatic artery lymph node was harvested and was negative.  Gallbladder negative for malignancy.   Cystic duct excision negative for malignancy. pT1b pN0  10/25/2021, CT abdomen pelvis with contrast showed rim-enhancing fluid collection in the region of the hepatic hilum. Patient had a readmission for superficial wound infection.  She completed antibiotics on 11/03/2021.  11/03/2021, CT abdomen pelvis with contrast showed near resolution of previously seen fluid collection in the region of the hepatic hilum.  Similar appearance of the area of hypoenhancement in the liver.  Attention on follow-up.  INTERVAL HISTORY Cassandra [name] is a 66 y.o. female who has above history reviewed by me today presents for pancreatic cancer.   11/17/2021, resumed on gemcitabine/Abraxane. She felt some nausea after the treatments. 1 week following the treatment, ANC was decreased to 0.4.  Patient received 1 dose of Zarxio.  She has experienced severe back pain afterwards.  The second dose of Zarxio was canceled. Today patient reports feeling well.  Denies any nausea vomiting diarrhea, fever or chills.  She took Tylenol for the back pain and symptom has resolved.   Review of Systems  Constitutional:  Negative for appetite change, chills, fatigue, fever and unexpected weight change.  HENT:   Negative for hearing loss and voice change.   Eyes:  Negative for eye problems.  Respiratory:  Negative for chest tightness and cough.   Cardiovascular:  Negative for chest pain.  Gastrointestinal:  Negative for abdominal distention, abdominal pain, blood in stool and nausea.  Endocrine: Negative for hot flashes.  Genitourinary:  Negative for difficulty urinating and frequency.   Musculoskeletal:  Negative for arthralgias.  Skin:  Negative for itching and rash.  Neurological:  Positive for numbness. Negative for extremity weakness.  Hematological:  Negative for adenopathy.  Psychiatric/Behavioral:  Negative for confusion.    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   Neutropenia ([place]) 04/18/2020   Osteopenia after menopause 05/2017   femoral neck T score -2.0   Personal history of chemotherapy    current for pancreatic ca   PONV (postoperative nausea and vomiting)    Pure hypercholesterolemia     SURGICAL HISTORY: Past Surgical History:  Procedure Laterality Date   COLONOSCOPY  07/2015   WNL   COLONOSCOPY  2008/2011   COLONOSCOPY WITH PROPOFOL N/A 07/10/2021   Procedure: COLONOSCOPY WITH PROPOFOL;  Surgeon: [name], MD;  Location: [facility] ENDOSCOPY;  Service: Endoscopy;  Laterality: N/A;   IR CV LINE INJECTION  04/28/2021   OVARIAN CYST REMOVAL  1992   dermoid-Dr CAK   PORTA CATH INSERTION N/A 03/07/2020   Procedure: PORTA CATH INSERTION;  Surgeon: Algernon Huxley, MD;  Location: [place] CV LAB;  Service: Cardiovascular;  Laterality: N/A;   TEE WITHOUT CARDIOVERSION N/A 03/21/2020   Procedure: TRANSESOPHAGEAL ECHOCARDIOGRAM (TEE);  Surgeon: Dionisio David, MD;  Location: [facility] ORS;  Service: Cardiovascular;  Laterality: N/A;   TUBAL LIGATION  1993    SOCIAL HISTORY: Social History   Socioeconomic History   Marital status: Married    Spouse name: Not on file   Number of children: 2   Years of education: Not on file   Highest education level: Not on file  Occupational History   Occupation: Teacher    Comment: retired   Occupation: Psychologist, sport and exercise  Tobacco Use  Smoking status: Former    Packs/day: 1.00    Years: 12.00    Pack years: 12.00    Types: Cigarettes    Quit date: 72    Years since quitting: 36.4   Smokeless tobacco: Never   Tobacco comments:    Quit smoking 1987  Vaping Use   Vaping Use: Never used  Substance and Sexual Activity   Alcohol use: Not Currently    Alcohol/week: 7.0 standard drinks    Types: 7 Glasses of wine per week    Comment: 0-2 mixed drinks a day   Drug use: No   Sexual activity: Not  Currently    Birth control/protection: Post-menopausal  Other Topics Concern   Not on file  Social History Narrative   Not on file   Social Determinants of Health   Financial Resource Strain: Low Risk    Difficulty of Paying Living Expenses: Not hard at all  Food Insecurity: No Food Insecurity   Worried About Charity fundraiser in the Last Year: Never true   Ran Out of Food in the Last Year: Never true  Transportation Needs: No Transportation Needs   Lack of Transportation (Medical): No   Lack of Transportation (Non-Medical): No  Physical Activity: Sufficiently Active   Days of Exercise per Week: 5 days   Minutes of Exercise per Session: 40 min  Stress: No Stress Concern Present   Feeling of Stress : Not at all  Social Connections: Moderately Isolated   Frequency of Communication with Friends and Family: More than three times a week   Frequency of Social Gatherings with Friends and Family: Three times a week   Attends Religious Services: Never   Active Member of Clubs or Organizations: No   Attends Music therapist: Never   Marital Status: Married  Human resources officer Violence: Not At Risk   Fear of Current or Ex-Partner: No   Emotionally Abused: No   Physically Abused: No   Sexually Abused: No    FAMILY HISTORY: Family History  Problem Relation Age of Onset   Breast cancer Paternal Aunt 63   Diabetes Mother    Osteoporosis Mother    Hyperlipidemia Father    Rheumatic fever Father    Valvular heart disease Father    Cancer Paternal Grandmother        possible colon   Breast cancer Sister 59    ALLERGIES:  is allergic to penicillin g.  MEDICATIONS:  Current Outpatient Medications  Medication Sig Dispense Refill   Calcium Carbonate-Vit D-Min (CALCIUM 1200 PO) Take by mouth.     Cholecalciferol 25 MCG (1000 UT) tablet Take 2,000 Units by mouth daily.     CREON 24000-76000 units CPEP TAKE 1 CAPSULE (24,000 UNITS TOTAL) BY MOUTH 3 (THREE) TIMES DAILY  BEFORE MEALS. 200 capsule 3   Cyanocobalamin (B-12 PO) Take by mouth.     lidocaine-prilocaine (EMLA) cream APPLY TO AFFECTED AREA ONCE AS DIRECTED     Multiple Vitamins-Minerals (MULTIVITAMIN WITH MINERALS) tablet Take 1 tablet by mouth daily.     ondansetron (ZOFRAN-ODT) 4 MG disintegrating tablet Take 4 mg by mouth every 8 (eight) hours as needed.     prochlorperazine (COMPAZINE) 10 MG tablet TAKE 1 TABLET (10 MG TOTAL) BY MOUTH EVERY 6 (SIX) HOURS AS NEEDED (NAUSEA OR VOMITING).     clobetasol cream (TEMOVATE) 0.05 % once daily as needed (Patient not taking: Reported on 11/09/2021)     simethicone (GAS-X) 80 MG chewable tablet Chew 1 tablet (  80 mg total) by mouth every 8 (eight) hours as needed for flatulence. (Patient not taking: Reported on 11/09/2021) 60 tablet 0   No current facility-administered medications for this visit.   Facility-Administered Medications Ordered in Other Visits  Medication Dose Route Frequency Provider Last Rate Last Admin   gemcitabine (GEMZAR) 1,748 mg in sodium chloride 0.9 % 250 mL chemo infusion  1,000 mg/m2 (Order-Specific) Intravenous Once [name], MD 592 mL/hr at 11/30/21 1211 1,748 mg at 11/30/21 1211   heparin lock flush 100 UNIT/ML injection            heparin lock flush 100 unit/mL  500 Units Intravenous Once [name], MD       heparin lock flush 100 unit/mL  500 Units Intracatheter Once PRN [name], MD       prochlorperazine (COMPAZINE) 10 MG tablet            sodium chloride flush (NS) 0.9 % injection 10 mL  10 mL Intravenous PRN [name], MD   10 mL at 02/18/21 0858   sodium chloride flush (NS) 0.9 % injection 10 mL  10 mL Intravenous Once Borders, Kirt Boys, NP       sodium chloride flush (NS) 0.9 % injection 10 mL  10 mL Intravenous Once [name], MD       sodium chloride flush (NS) 0.9 % injection 10 mL  10 mL Intravenous PRN [name], MD   10 mL at 11/30/21 0836     PHYSICAL EXAMINATION: ECOG PERFORMANCE STATUS: 0 - Asymptomatic Vitals:    11/30/21 0842  BP: 125/83  Pulse: (!) 58  Temp: (!) 97.3 F (36.3 C)   Filed Weights   11/30/21 0842  Weight: 135 lb (61.2 kg)    Physical Exam Constitutional:      General: She is not in acute distress. HENT:     Head: Normocephalic and atraumatic.  Eyes:     General: No scleral icterus. Cardiovascular:     Rate and Rhythm: Normal rate and regular rhythm.     Heart sounds: Normal heart sounds.  Pulmonary:     Effort: Pulmonary effort is normal. No respiratory distress.     Breath sounds: No wheezing.  Abdominal:     General: Bowel sounds are normal. There is no distension.     Palpations: Abdomen is soft.  Musculoskeletal:        General: No deformity. Normal range of motion.     Cervical back: Normal range of motion and neck supple.  Skin:    General: Skin is warm and dry.     Coloration: Skin is not jaundiced.     Findings: No erythema or rash.  Neurological:     Mental Status: She is alert and oriented to person, place, and time. Mental status is at baseline.     Cranial Nerves: No cranial nerve deficit.     Coordination: Coordination normal.  Psychiatric:        Mood and Affect: Mood normal.    LABORATORY DATA:  I have reviewed the data as listed Lab Results  Component Value Date   WBC 3.9 (L) 11/30/2021   HGB 12.0 11/30/2021   HCT 36.0 11/30/2021   MCV 95.2 11/30/2021   PLT 178 11/30/2021   Recent Labs    11/17/21 0852 11/24/21 0844 11/30/21 0829  NA 137 139 141  K 4.2 4.2 4.1  CL 104 109 107  CO2 _0 GLUCOSE 109* 115* 97  BUN _0 CREATININE 0.62 0.64 0.65  CALCIUM 8.6* 8.7* 8.8*  GFRNONAA >60 >60 >60  PROT 7.1 7.1 6.9  ALBUMIN 3.9 4.0 3.9  AST 28 39 38  ALT 25 32 31  ALKPHOS 71 73 89  BILITOT 0.7 0.2* 0.2*    Iron/TIBC/Ferritin/ %Sat No results found for: IRON, TIBC, FERRITIN, IRONPCTSAT    RADIOGRAPHIC STUDIES: I have personally reviewed the radiological images as listed and agreed with the findings in the  report. Reviewed findings of MRI abdomen MRCP done at [facility]. No results found.    ASSESSMENT & PLAN:  1. Primary pancreatic cancer ([place])   2. 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 Pancreatic insufficiency   5. Chemotherapy induced neutropenia ([facility])   6. 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  Pathology proved liver metastatic disease- additional chemotherapy --> whipple procedure.  ypT1b ypN0 Labs reviewed and discussed with patient Proceed with cycle 4 day 8 gemcitabine/Abraxane.-Day 8 was delayed due to neutropenia.  Patient will receive prophylactic Zarxio on 12/01/2021.  Future chemotherapy treatment plan, 2 weeks on 1 week off gemcitabine/Abraxane with G-CSF support.  # #Chemotherapy-induced neuropathy of fingertips and toes, grade 2.  Not tolerating Cymbalta.  Stable symptoms. She feels some improvement with aquapunctures.    #pancreatic insufficiency, status post Whipple procedure..  Continue Creon, continue 24000 units TID with meals.    #Elevated CEA,-s/p colonoscopy no significant findings.  All questions were answered. The patient knows to call the clinic with any problems questions or concerns.  Return of visit: 2 weeks lab MD gemcitabine Abraxane.  + Zarxio on day 2.     [name], MD, PhD  11/30/2021

## 2021-11-30 NOTE — Patient Instructions (Signed)
[facility] AT [place]  Discharge Instructions: Thank you for choosing [facility] to provide your oncology and hematology care.  If you have a lab appointment with the [facility], please go directly to the [facility] and check in at the registration area.  Wear comfortable clothing and clothing appropriate for easy access to any Portacath or PICC line.   We strive to give you quality time with your provider. You may need to reschedule your appointment if you arrive late (15 or more minutes).  Arriving late affects you and other patients whose appointments are after yours.  Also, if you miss three or more appointments without notifying the office, you may be dismissed from the clinic at the provider's discretion.      For prescription refill requests, have your pharmacy contact our office and allow 72 hours for refills to be completed.    Today you received the following chemotherapy and/or immunotherapy agents Gemzar & Abraxane      To help prevent nausea and vomiting after your treatment, we encourage you to take your nausea medication as directed.  BELOW ARE SYMPTOMS THAT SHOULD BE REPORTED IMMEDIATELY: *FEVER GREATER THAN 100.4 F (38 C) OR HIGHER *CHILLS OR SWEATING *NAUSEA AND VOMITING THAT IS NOT CONTROLLED WITH YOUR NAUSEA MEDICATION *UNUSUAL SHORTNESS OF BREATH *UNUSUAL BRUISING OR BLEEDING *URINARY PROBLEMS (pain or burning when urinating, or frequent urination) *BOWEL PROBLEMS (unusual diarrhea, constipation, pain near the anus) TENDERNESS IN MOUTH AND THROAT WITH OR WITHOUT PRESENCE OF ULCERS (sore throat, sores in mouth, or a toothache) UNUSUAL RASH, SWELLING OR PAIN  UNUSUAL VAGINAL DISCHARGE OR ITCHING   Items with * indicate a potential emergency and should be followed up as soon as possible or go to the Emergency Department if any problems should occur.  Please show the CHEMOTHERAPY ALERT CARD or IMMUNOTHERAPY ALERT CARD at  check-in to the Emergency Department and triage nurse.  Should you have questions after your visit or need to cancel or reschedule your appointment, please contact [facility] [place] AT [place]  [phone] and follow the prompts.  Office hours are 8:00 a.m. to 4:30 p.m. Monday - Friday. Please note that voicemails left after 4:00 p.m. may not be returned until the following business day.  We are closed weekends and major holidays. You have access to a nurse at all times for urgent questions. Please call the main number to the clinic [phone] and follow the prompts.  For any non-urgent questions, you may also contact your provider using MyChart. We now offer e-Visits for anyone 66 and older to request care online for non-urgent symptoms. For details visit [email or web address].   Also download the MyChart app! Go to the app store, search "MyChart", open the app, select [facility], and log in with your MyChart username and password.  Due to Covid, a mask is required upon entering the hospital/clinic. If you do not have a mask, one will be given to you upon arrival. For doctor visits, patients may have 1 support person aged 66 or older with them. For treatment visits, patients cannot have anyone with them due to current Covid guidelines and our immunocompromised population.

## 2021-12-01 ENCOUNTER — Ambulatory Visit: Payer: Medicare Other | Admitting: Oncology

## 2021-12-01 ENCOUNTER — Inpatient Hospital Stay: Payer: Medicare Other

## 2021-12-01 ENCOUNTER — Ambulatory Visit: Payer: Medicare Other

## 2021-12-01 DIAGNOSIS — Z5111 Encounter for antineoplastic chemotherapy: Secondary | ICD-10-CM | POA: Diagnosis not present

## 2021-12-01 DIAGNOSIS — D709 Neutropenia, unspecified: Secondary | ICD-10-CM

## 2021-12-01 MED ORDER — FILGRASTIM-SNDZ 480 MCG/0.8ML IJ SOSY
480.0000 ug | PREFILLED_SYRINGE | Freq: Once | INTRAMUSCULAR | Status: AC
  Administered 2021-12-01: 480 ug via SUBCUTANEOUS
  Filled 2021-12-01: qty 0.8

## 2021-12-02 ENCOUNTER — Inpatient Hospital Stay: Payer: Medicare Other

## 2021-12-03 ENCOUNTER — Inpatient Hospital Stay: Payer: Medicare Other

## 2021-12-14 ENCOUNTER — Inpatient Hospital Stay (HOSPITAL_BASED_OUTPATIENT_CLINIC_OR_DEPARTMENT_OTHER): Payer: Medicare Other | Admitting: Oncology

## 2021-12-14 ENCOUNTER — Inpatient Hospital Stay: Payer: Medicare Other

## 2021-12-14 ENCOUNTER — Encounter: Payer: Self-pay | Admitting: Oncology

## 2021-12-14 DIAGNOSIS — K8689 Other specified diseases of pancreas: Secondary | ICD-10-CM | POA: Diagnosis not present

## 2021-12-14 DIAGNOSIS — Z792 Long term (current) use of antibiotics: Secondary | ICD-10-CM | POA: Insufficient documentation

## 2021-12-14 DIAGNOSIS — C259 Malignant neoplasm of pancreas, unspecified: Secondary | ICD-10-CM

## 2021-12-14 DIAGNOSIS — Z5111 Encounter for antineoplastic chemotherapy: Secondary | ICD-10-CM

## 2021-12-14 DIAGNOSIS — G620 Drug-induced polyneuropathy: Secondary | ICD-10-CM

## 2021-12-14 DIAGNOSIS — T451X5A Adverse effect of antineoplastic and immunosuppressive drugs, initial encounter: Secondary | ICD-10-CM

## 2021-12-14 LAB — CBC WITH DIFFERENTIAL/PLATELET
Abs Immature Granulocytes: 0.01 10*3/uL (ref 0.00–0.07)
Basophils Absolute: 0 10*3/uL (ref 0.0–0.1)
Basophils Relative: 1 %
Eosinophils Absolute: 0.1 10*3/uL (ref 0.0–0.5)
Eosinophils Relative: 3 %
HCT: 35.5 % — ABNORMAL LOW (ref 36.0–46.0)
Hemoglobin: 11.9 g/dL — ABNORMAL LOW (ref 12.0–15.0)
Immature Granulocytes: 0 %
Lymphocytes Relative: 29 %
Lymphs Abs: 1.2 10*3/uL (ref 0.7–4.0)
MCH: 32.6 pg (ref 26.0–34.0)
MCHC: 33.5 g/dL (ref 30.0–36.0)
MCV: 97.3 fL (ref 80.0–100.0)
Monocytes Absolute: 0.5 10*3/uL (ref 0.1–1.0)
Monocytes Relative: 12 %
Neutro Abs: 2.3 10*3/uL (ref 1.7–7.7)
Neutrophils Relative %: 55 %
Platelets: 183 10*3/uL (ref 150–400)
RBC: 3.65 MIL/uL — ABNORMAL LOW (ref 3.87–5.11)
RDW: 14.7 % (ref 11.5–15.5)
WBC: 4.2 10*3/uL (ref 4.0–10.5)
nRBC: 0 % (ref 0.0–0.2)

## 2021-12-14 LAB — COMPREHENSIVE METABOLIC PANEL
ALT: 24 U/L (ref 0–44)
AST: 30 U/L (ref 15–41)
Albumin: 3.8 g/dL (ref 3.5–5.0)
Alkaline Phosphatase: 86 U/L (ref 38–126)
Anion gap: 6 (ref 5–15)
BUN: 16 mg/dL (ref 8–23)
CO2: 25 mmol/L (ref 22–32)
Calcium: 8.6 mg/dL — ABNORMAL LOW (ref 8.9–10.3)
Chloride: 110 mmol/L (ref 98–111)
Creatinine, Ser: 0.71 mg/dL (ref 0.44–1.00)
GFR, Estimated: >60 mL/min (ref >60)
Glucose, Bld: 101 mg/dL — ABNORMAL HIGH (ref 70–99)
Potassium: 3.7 mmol/L (ref 3.5–5.1)
Sodium: 141 mmol/L (ref 135–145)
Total Bilirubin: 0.5 mg/dL (ref 0.3–1.2)
Total Protein: 6.7 g/dL (ref 6.5–8.1)

## 2021-12-14 MED ORDER — PACLITAXEL PROTEIN-BOUND CHEMO INJECTION 100 MG
100.0000 mg/m2 | Freq: Once | INTRAVENOUS | Status: AC
  Administered 2021-12-14: 175 mg via INTRAVENOUS
  Filled 2021-12-14: qty 35

## 2021-12-14 MED ORDER — SODIUM CHLORIDE 0.9% FLUSH
10.0000 mL | Freq: Once | INTRAVENOUS | Status: AC
  Administered 2021-12-14: 10 mL via INTRAVENOUS
  Filled 2021-12-14: qty 10

## 2021-12-14 MED ORDER — DOXYCYCLINE HYCLATE 100 MG PO TABS
100.0000 mg | ORAL_TABLET | Freq: Once | ORAL | 0 refills | Status: AC
Start: 2021-12-14 — End: 2021-12-14

## 2021-12-14 MED ORDER — PROCHLORPERAZINE MALEATE 10 MG PO TABS
10.0000 mg | ORAL_TABLET | Freq: Once | ORAL | Status: AC
  Administered 2021-12-14: 10 mg via ORAL
  Filled 2021-12-14: qty 1

## 2021-12-14 MED ORDER — HEPARIN SOD (PORK) LOCK FLUSH 100 UNIT/ML IV SOLN
INTRAVENOUS | Status: AC
  Administered 2021-12-14: 500 [IU]
  Filled 2021-12-14: qty 5

## 2021-12-14 MED ORDER — SODIUM CHLORIDE 0.9 % IV SOLN
1000.0000 mg/m2 | Freq: Once | INTRAVENOUS | Status: AC
  Administered 2021-12-14: 1748 mg via INTRAVENOUS
  Filled 2021-12-14: qty 45.97

## 2021-12-14 MED ORDER — HEPARIN SOD (PORK) LOCK FLUSH 100 UNIT/ML IV SOLN
500.0000 [IU] | Freq: Once | INTRAVENOUS | Status: AC | PRN
  Filled 2021-12-14: qty 5

## 2021-12-14 MED ORDER — SODIUM CHLORIDE 0.9 % IV SOLN
Freq: Once | INTRAVENOUS | Status: AC
  Filled 2021-12-14: qty 250

## 2021-12-14 NOTE — Assessment & Plan Note (Signed)
Chemotherapy plan as listed above 

## 2021-12-14 NOTE — Progress Notes (Signed)
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  Pathology proved liver metastatic disease- additional chemotherapy --> whipple procedure.--> adjuvant Gem/Abraxane ypT1b ypN0 Labs reviewed and discussed with patient Proceed with cycle 5 day 1 gemcitabine/Abraxane.-D2 Zarxio. In 1 week, she will proceed with D8 treatments if labs meet parameters.  [2 weeks on 1 week off gemcitabine/Abraxane with G-CSF support.]   Encounter for antineoplastic chemotherapy Chemotherapy plan as listed above  Chemotherapy-induced neuropathy (HCC) fingertips and toes, grade 2.  Not tolerating Cymbalta.  Stable symptoms. Acupuncture sessions as needed..   Pancreatic insufficiency  status post Whipple procedure..  Continue Creon, continue 24000 units TID with meals, I encourage patient also take with her snacks  Need for prophylactic antibiotic Patient has chemotherapy-induced neutropenia.  I recommend prophylactic antibiotics prior to dental procedure.  Prescription sent to pharmacy  No orders of the defined types were placed in this encounter.  Follow up 1 week, Lab Gem+ Abraxane, D2 Zarxio 7/11 lab MD Gem+ Abraxane, D2 Zarxio  All questions were answered. The patient knows to call the clinic with any problems, questions or concerns.  [name], MD, PhD [facility] Hematology Oncology 12/14/2021   [name], MD  CHIEF COMPLAINTS/REASON FOR VISIT:  Follow up for treatment of pancreatic adenocarcinoma  HISTORY OF PRESENTING  ILLNESS:   Cassandra [name] is a  66 y.o.  female with PMH listed below was seen in consultation at the request of  [name], MD  for evaluation of pancreatic adenocarcinoma Patient initially presented with jaundice, transaminitis, bilirubin was 9.9.  CA 19-9 was 1874.  Patient also reports unintentional weight loss. 02/08/2020 MRI abdomen and MRCP with and without contrast was done at [facility] which showed pancreatic head mass measuring up to 3 cm, with marked associated narrowing of the portal vein confluence.  SMA is preserved.  Marked intrahepatic and extrahepatic biliary duct dilatation as well as mild dilatation of the main pancreatic duct. Multiple hepatic masses highly concerning for metastatic disease.  Patient underwent EUS on 02/19/2020, which showed irregular mass identified in the pancreatic head, hypoechoic, measured 3mx33mm, sonographic evidence concerning for invasion into the superior mesenteric artery.  There is no sign of significant abnormality in the main pancreatic duct.  Dilatation of common bile duct which measured up to 16 mm.  Region of celiac artery was visualized and showed no signs of significant abnormality.  No lymphadenopathy.  FNA showed adenocarcinoma.  02/19/2020, ERCP, malignant.  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  The stricture was treated with placement of wall flex metal stent.  [facility] ZPia Mauand was recommended for 3 drug regimen FOLFIRINOX.  [facility]  Patient was referred to establish care today. She denies any pain.  Since stent placement, skin jaundice has improved.  Itchiness has also improved. Patient was accompanied by her husband today.  She has a family history of breast cancer in sister and paternal aunt, colon cancer paternal grandmother.  #No reportable targetable mutation on NGS 9/14/2021cycle 1  FOLFIRINOX.  Patient received oxaliplatin and about 50% of Irinotecan on day 1 and had experienced neurologic symptoms.  She went to ER and working diagnosis is TIA, and eventually I think this is due to irinotecan side effects -irinotecan-associated dysarthria, lip/tongue numbness.  Adjustment was made for Irinotecan to be  infused over 180 minutes.  Atropine 0.5 mg once prior to the irinotecan. No recurrent symptoms.   # 03/19/2020-03/21/2020 patient was admitted due to sepsis with strep pneumonia bacteremia.  Patient was treated with IV Rocephin.  TEE was done which showed no vegetation.  No PFO or ASD.  Patient was discharged home and he finished full course of 14 days of IV Rocephin on 04/02/2020 per ID recommendation.  Repeat blood culture was also negative.  08/25/20 cycle 10 FOLFIRINOX 09/08/20- present  Starting cycle 11, FOLFIRI, Oxaliplatin discontinued due to neuropathy   #NGS showed no reportable targetable mutation #Genetic testing-[employer] diagnostic testing showed no pathological variants identified. MS stable, TMB 42mt/mb, KRAS G12D, SF3B1 K700E, TP53 [id number]  #01/14/2021  [facility].  # [facility] [TDayton Scrapeere 4~5 liver lesions on OSH MRI last year at the time of diagnosis highly suspicious for metastasis. She is not eligible for surgical protocol for metastasis resection given the number of liver metastasis is >3. However given her excellent and durable response to chemo, surgical resection may be considered pending sustained disease control at 1 year and may require partial hepatectomy first to see if there is viable tumor before proceeding to Whipple resection ]  Patient had a COVID-19 infection in September 2022.  03/23/2021 CT abdomen pelvis No significant change in the ill-defined pancreatic head mass or  associated biliary ductal and pancreatic ductal dilation.  Slight interval enlargement of  subcentimeter anterior peripancreatic  lymph node, nonspecific. Attention on follow-up per clinical protocol. Similar enlargement of the ascending thoracic aorta measuring 4.4 cm  03/23/2021 MRI abdomen w and wo  Increasing ill-defined hypoenhancement of the pancreatic head surrounding the common bile duct stent, in keeping with known pancreatic malignancy. Anterior peripancreatic lymph node is better evaluated on CT.  Unchanged position of a common bile duct stent with similar mild diffuse intrahepatic ductal dilation and pneumobilia. No evidence of metastatic disease in the abdomen or pelvis. Partially visualized cystic left adnexal lesion measuring up to 3.9 cm.   04/10/2021, [facility]  [facility]. Segments 3 partial hepatectomy, negative for viable tumor. Section 5/6 partial hepatectomy part 1 and 2, microscopic foci of residual viable adenocarcinoma, morphologically consistent with history of pancreatic primary.  Parenchymal margin is uninvolved.  #Patient resumed on FOLFIRI on 04/28/2021. She got another cycle on 05/12/2021 #05/26/2021, patient did not get additional chemotherapy due to transaminitis.  Shared decision was made to stop FOLFIRI and switch to gemcitabine Abraxane treatments.  05/26/2021, patient developed transaminitis, AST 763, ALT 691, alkaline phosphatase 364.  Bilirubin 0.7 05/26/2021 stat ultrasound abdomen right upper quadrant showed interval development of 3.3 x 1.8 cm complex mass in the right hepatic lobe.  Increased extrahepatic ductal dilatation.  Concerning for CBD obstruction. 05/29/2021, MRI abdomen MRCP with and without contrast showed unchanged pancreatic head soft tissue.  Severe intra and extrahepatic biliary ductal dilatation.  Common bile duct stents remain in position however patency is not established.  No evidence of lymphadenopathy or metastatic disease in the abdomen.  With further  communication  with radiologist, addendum was added that there is internal fluid signal and no appreciable associated contrast enhancement measuring 3.4 x 2.3 cm.  This appearance is generally not consistent with metastasis and is of uncertain significance.  Possibly reflecting hepatic abscess or residual of subcapsular hematoma.  Patient was seen by [facility] Dr. Alwyn Pea team.  He had ERCP done on 06/05/2021, with findings of One stent from the biliary tree was seen in the major papilla. The stent had migrated significantly into the duodenum. This is the cause of stent malfunction. One stent was removed from the biliary tree. Prior biliary sphincterotomy appeared open. - A single severe biliary stricture was found in the lower third of the main bile duct with upstream dilation. The stricture was alignant appearing.- The biliary tree was swept and sludge was found.- One fully covered metal stent was placed into the common bile duct across the stricture.- No pancreatogram performed.  06/12/2021-08/10/2021 gemcitabine and Abraxane.   10/14/2021, patient underwent Whipple procedure. Liver biopsy negative for malignancy. Review procedure showed invasive adenocarcinoma, moderately differentiated, centered in pancreatic head and confined in the Pancreas. Surgical margin is negative for malignancy.  36 lymph nodes were all negative for malignancy.  1 hepatic artery lymph node was harvested and was negative.  Gallbladder negative for malignancy.  Cystic duct excision negative for malignancy. pT1b pN0  10/25/2021, CT abdomen pelvis with contrast showed rim-enhancing fluid collection in the region of the hepatic hilum. Patient had a readmission for superficial wound infection.  She completed antibiotics on 11/03/2021.  11/03/2021, CT abdomen pelvis with contrast showed near resolution of previously seen fluid collection in the region of the hepatic hilum.  Similar appearance of the area of hypoenhancement in the liver.   Attention on follow-up.  INTERVAL HISTORY Cassandra [name] is a 66 y.o. female who has above history reviewed by me today presents for pancreatic cancer.   11/17/2021, resumed on gemcitabine/Abraxane. Patient reports feeling well today.  No new complaints.  Chronic neuropathy symptoms are stable, slightly improved.  She has not had any acupuncture sessions recently.  Symptoms have not worsened. There is plan of teeth cleaning. No nausea vomiting fever chills.  She takes Creon for pancreatic insufficiency with meals.  Some diarrhea symptoms after taking snacks.   Review of Systems  Constitutional:  Negative for appetite change, chills, fatigue, fever and unexpected weight change.  HENT:   Negative for hearing loss and voice change.   Eyes:  Negative for eye problems.  Respiratory:  Negative for chest tightness and cough.   Cardiovascular:  Negative for chest pain.  Gastrointestinal:  Negative for abdominal distention, abdominal pain, blood in stool and nausea.  Endocrine: Negative for hot flashes.  Genitourinary:  Negative for difficulty urinating and frequency.   Musculoskeletal:  Negative for arthralgias.  Skin:  Negative for itching and rash.  Neurological:  Positive for numbness. Negative for extremity weakness.  Hematological:  Negative for adenopathy.  Psychiatric/Behavioral:  Negative for confusion.     MEDICAL HISTORY:  Past Medical History:  Diagnosis Date   Anemia    Cancer ([place])    pancreatic cancer   Colon polyps    Family history of breast cancer    Neuropathy due to drug ([place])    chemo induced   Neutropenia ([place]) 04/18/2020   Osteopenia after menopause 05/2017   femoral neck T score -2.0   Personal history of chemotherapy    current for pancreatic ca   PONV (postoperative nausea and vomiting)  Pure hypercholesterolemia     SURGICAL HISTORY: Past Surgical History:  Procedure Laterality Date   COLONOSCOPY  07/2015   WNL   COLONOSCOPY  2008/2011   COLONOSCOPY  WITH PROPOFOL N/A 07/10/2021   Procedure: COLONOSCOPY WITH PROPOFOL;  Surgeon: [name], MD;  Location: [facility] ENDOSCOPY;  Service: Endoscopy;  Laterality: N/A;   IR CV LINE INJECTION  04/28/2021   OVARIAN CYST REMOVAL  1992   dermoid-Dr CAK   PORTA CATH INSERTION N/A 03/07/2020   Procedure: PORTA CATH INSERTION;  Surgeon: Algernon Huxley, MD;  Location: [place] CV LAB;  Service: Cardiovascular;  Laterality: N/A;   TEE WITHOUT CARDIOVERSION N/A 03/21/2020   Procedure: TRANSESOPHAGEAL ECHOCARDIOGRAM (TEE);  Surgeon: Dionisio David, MD;  Location: [facility] ORS;  Service: Cardiovascular;  Laterality: N/A;   TUBAL LIGATION  1993    SOCIAL HISTORY: Social History   Socioeconomic History   Marital status: Married    Spouse name: Not on file   Number of children: 2   Years of education: Not on file   Highest education level: Not on file  Occupational History   Occupation: Pharmacist, hospital    Comment: retired   Occupation: Psychologist, sport and exercise  Tobacco Use   Smoking status: Former    Packs/day: 1.00    Years: 12.00    Total pack years: 12.00    Types: Cigarettes    Quit date: 1987    Years since quitting: 36.4   Smokeless tobacco: Never   Tobacco comments:    Quit smoking 1987  Vaping Use   Vaping Use: Never used  Substance and Sexual Activity   Alcohol use: Not Currently    Alcohol/week: 7.0 standard drinks of alcohol    Types: 7 Glasses of wine per week    Comment: 0-2 mixed drinks a day   Drug use: No   Sexual activity: Not Currently    Birth control/protection: Post-menopausal  Other Topics Concern   Not on file  Social History Narrative   Not on file   Social Determinants of Health   Financial Resource Strain: Low Risk  (10/08/2021)   Overall Financial Resource Strain (CARDIA)    Difficulty of Paying Living Expenses: Not hard at all  Food Insecurity: No Food Insecurity (10/08/2021)   Hunger Vital Sign    Worried About Running Out of Food in the Last Year: Never true    Ran Out of  Food in the Last Year: Never true  Transportation Needs: No Transportation Needs (10/08/2021)   PRAPARE - Hydrologist (Medical): No    Lack of Transportation (Non-Medical): No  Physical Activity: Sufficiently Active (10/08/2021)   Exercise Vital Sign    Days of Exercise per Week: 5 days    Minutes of Exercise per Session: 40 min  Stress: No Stress Concern Present (10/08/2021)   [place]    Feeling of Stress : Not at all  Social Connections: Moderately Isolated (10/08/2021)   Social Connection and Isolation Panel [NHANES]    Frequency of Communication with Friends and Family: More than three times a week    Frequency of Social Gatherings with Friends and Family: Three times a week    Attends Religious Services: Never    Active Member of Clubs or Organizations: No    Attends Archivist Meetings: Never    Marital Status: Married  Human resources officer Violence: Not At Risk (10/08/2021)   Humiliation, Afraid, Rape, and Kick  questionnaire    Fear of Current or Ex-Partner: No    Emotionally Abused: No    Physically Abused: No    Sexually Abused: No    FAMILY HISTORY: Family History  Problem Relation Age of Onset   Breast cancer Paternal Aunt 33   Diabetes Mother    Osteoporosis Mother    Hyperlipidemia Father    Rheumatic fever Father    Valvular heart disease Father    Cancer Paternal Grandmother        possible colon   Breast cancer Sister 43    ALLERGIES:  is allergic to penicillin g.  MEDICATIONS:  Current Outpatient Medications  Medication Sig Dispense Refill   Calcium Carbonate-Vit D-Min (CALCIUM 1200 PO) Take by mouth.     Cholecalciferol 25 MCG (1000 UT) tablet Take 2,000 Units by mouth daily.     CREON 24000-76000 units CPEP TAKE 1 CAPSULE (24,000 UNITS TOTAL) BY MOUTH 3 (THREE) TIMES DAILY BEFORE MEALS. 200 capsule 3   Cyanocobalamin (B-12 PO) Take by mouth.      lidocaine-prilocaine (EMLA) cream APPLY TO AFFECTED AREA ONCE AS DIRECTED     Multiple Vitamins-Minerals (MULTIVITAMIN WITH MINERALS) tablet Take 1 tablet by mouth daily.     ondansetron (ZOFRAN-ODT) 4 MG disintegrating tablet Take 4 mg by mouth every 8 (eight) hours as needed.     prochlorperazine (COMPAZINE) 10 MG tablet TAKE 1 TABLET (10 MG TOTAL) BY MOUTH EVERY 6 (SIX) HOURS AS NEEDED (NAUSEA OR VOMITING).     clobetasol cream (TEMOVATE) 0.05 % once daily as needed (Patient not taking: Reported on 11/09/2021)     simethicone (GAS-X) 80 MG chewable tablet Chew 1 tablet (80 mg total) by mouth every 8 (eight) hours as needed for flatulence. (Patient not taking: Reported on 11/09/2021) 60 tablet 0   No current facility-administered medications for this visit.   Facility-Administered Medications Ordered in Other Visits  Medication Dose Route Frequency Provider Last Rate Last Admin   gemcitabine (GEMZAR) 1,748 mg in sodium chloride 0.9 % 250 mL chemo infusion  1,000 mg/m2 (Order-Specific) Intravenous Once [name], MD       heparin lock flush 100 UNIT/ML injection            heparin lock flush 100 UNIT/ML injection            heparin lock flush 100 unit/mL  500 Units Intracatheter Once PRN [name], MD       PACLitaxel-protein bound (ABRAXANE) chemo infusion 175 mg  100 mg/m2 (Treatment Plan Recorded) Intravenous Once [name], MD       prochlorperazine (COMPAZINE) 10 MG tablet            sodium chloride flush (NS) 0.9 % injection 10 mL  10 mL Intravenous PRN [name], MD   10 mL at 02/18/21 0858   sodium chloride flush (NS) 0.9 % injection 10 mL  10 mL Intravenous Once Borders, Vonna Kotyk R, NP       sodium chloride flush (NS) 0.9 % injection 10 mL  10 mL Intravenous Once [name], MD         PHYSICAL EXAMINATION: ECOG PERFORMANCE STATUS: 0 - Asymptomatic Vitals:   12/14/21 0859  BP: 123/84  Pulse: (!) 59  Resp: 18  Temp: (!) 97.3 F (36.3 C)   Filed Weights   12/14/21 0859  Weight: 134  lb 12.8 oz (61.1 kg)    Physical Exam Constitutional:      General: She is not in acute distress.  HENT:     Head: Normocephalic and atraumatic.  Eyes:     General: No scleral icterus. Cardiovascular:     Rate and Rhythm: Normal rate and regular rhythm.     Heart sounds: Normal heart sounds.  Pulmonary:     Effort: Pulmonary effort is normal. No respiratory distress.     Breath sounds: No wheezing.  Abdominal:     General: Bowel sounds are normal. There is no distension.     Palpations: Abdomen is soft.  Musculoskeletal:        General: No deformity. Normal range of motion.     Cervical back: Normal range of motion and neck supple.  Skin:    General: Skin is warm and dry.     Coloration: Skin is not jaundiced.     Findings: No erythema or rash.  Neurological:     Mental Status: She is alert and oriented to person, place, and time. Mental status is at baseline.     Cranial Nerves: No cranial nerve deficit.     Coordination: Coordination normal.  Psychiatric:        Mood and Affect: Mood normal.    LABORATORY DATA:  I have reviewed the data as listed Lab Results  Component Value Date   WBC 4.2 12/14/2021   HGB 11.9 (L) 12/14/2021   HCT 35.5 (L) 12/14/2021   MCV 97.3 12/14/2021   PLT 183 12/14/2021   Recent Labs    11/24/21 0844 11/30/21 0829 12/14/21 0842  NA 139 141 141  K 4.2 4.1 3.7  CL 109 107 110  CO2 _0 GLUCOSE 115* 97 101*  BUN _1 CREATININE 0.64 0.65 0.71  CALCIUM 8.7* 8.8* 8.6*  GFRNONAA >60 >60 >60  PROT 7.1 6.9 6.7  ALBUMIN 4.0 3.9 3.8  AST 39 38 30  ALT 32 31 24  ALKPHOS 73 89 86  BILITOT 0.2* 0.2* 0.5    Iron/TIBC/Ferritin/ %Sat No results found for: "IRON", "TIBC", "FERRITIN", "IRONPCTSAT"    RADIOGRAPHIC STUDIES: I have personally reviewed the radiological images as listed and agreed with the findings in the report. Reviewed findings of MRI abdomen MRCP done at [facility]. No results found.

## 2021-12-14 NOTE — Assessment & Plan Note (Signed)
status post Whipple procedure..  Continue Creon, continue 24000 units TID with meals, I encourage patient also take with her snacks

## 2021-12-14 NOTE — Assessment & Plan Note (Addendum)
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  Pathology proved liver metastatic disease- additional chemotherapy --> whipple procedure.--> adjuvant Gem/Abraxane ypT1b ypN0 Labs reviewed and discussed with patient Proceed with cycle 5 day 1 gemcitabine/Abraxane.-D2 Zarxio. In 1 week, she will proceed with D8 treatments if labs meet parameters.  [2 weeks on 1 week off gemcitabine/Abraxane with G-CSF support.]

## 2021-12-14 NOTE — Assessment & Plan Note (Signed)
fingertips and toes, grade 2.  Not tolerating Cymbalta.  Stable symptoms. Acupuncture sessions as needed.Marland Kitchen

## 2021-12-14 NOTE — Assessment & Plan Note (Signed)
Patient has chemotherapy-induced neutropenia.  I recommend prophylactic antibiotics prior to dental procedure.  Prescription sent to pharmacy

## 2021-12-14 NOTE — Progress Notes (Signed)
Pt here for follow up. No new concerns voiced.   

## 2021-12-14 NOTE — Patient Instructions (Signed)
[facility]  Discharge Instructions: Thank you for choosing [facility] to provide your oncology and hematology care.  If you have a lab appointment with the Cancer Center, please go directly to the Cancer Center and check in at the registration area.  Wear comfortable clothing and clothing appropriate for easy access to any Portacath or PICC line.   We strive to give you quality time with your provider. You may need to reschedule your appointment if you arrive late (15 or more minutes).  Arriving late affects you and other patients whose appointments are after yours.  Also, if you miss three or more appointments without notifying the office, you may be dismissed from the clinic at the provider's discretion.      For prescription refill requests, have your pharmacy contact our office and allow 72 hours for refills to be completed.    Today you received the following chemotherapy and/or immunotherapy agents: Gemzar, Abraxane      To help prevent nausea and vomiting after your treatment, we encourage you to take your nausea medication as directed.  BELOW ARE SYMPTOMS THAT SHOULD BE REPORTED IMMEDIATELY: *FEVER GREATER THAN 100.4 F (38 C) OR HIGHER *CHILLS OR SWEATING *NAUSEA AND VOMITING THAT IS NOT CONTROLLED WITH YOUR NAUSEA MEDICATION *UNUSUAL SHORTNESS OF BREATH *UNUSUAL BRUISING OR BLEEDING *URINARY PROBLEMS (pain or burning when urinating, or frequent urination) *BOWEL PROBLEMS (unusual diarrhea, constipation, pain near the anus) TENDERNESS IN MOUTH AND THROAT WITH OR WITHOUT PRESENCE OF ULCERS (sore throat, sores in mouth, or a toothache) UNUSUAL RASH, SWELLING OR PAIN  UNUSUAL VAGINAL DISCHARGE OR ITCHING   Items with * indicate a potential emergency and should be followed up as soon as possible or go to the Emergency Department if any problems should occur.  Please show the CHEMOTHERAPY ALERT CARD or IMMUNOTHERAPY ALERT CARD at  check-in to the Emergency Department and triage nurse.  Should you have questions after your visit or need to cancel or reschedule your appointment, please contact [facility]  [phone] and follow the prompts.  Office hours are 8:00 a.m. to 4:30 p.m. Monday - Friday. Please note that voicemails left after 4:00 p.m. may not be returned until the following business day.  We are closed weekends and major holidays. You have access to a nurse at all times for urgent questions. Please call the main number to the clinic [phone] and follow the prompts.  For any non-urgent questions, you may also contact your provider using MyChart. We now offer e-Visits for anyone 18 and older to request care online for non-urgent symptoms. For details visit [email or web address].   Also download the MyChart app! Go to the app store, search "MyChart", open the app, select [facility], and log in with your MyChart username and password.  Masks are optional in the cancer centers. If you would like for your care team to wear a mask while they are taking care of you, please let them know. For doctor visits, patients may have with them one support person who is at least 66 years old. At this time, visitors are not allowed in the infusion area.   

## 2021-12-15 ENCOUNTER — Inpatient Hospital Stay: Payer: Medicare Other

## 2021-12-15 DIAGNOSIS — Z5111 Encounter for antineoplastic chemotherapy: Secondary | ICD-10-CM | POA: Diagnosis not present

## 2021-12-15 DIAGNOSIS — D709 Neutropenia, unspecified: Secondary | ICD-10-CM

## 2021-12-15 LAB — CANCER ANTIGEN 19-9: CA 19-9: 4 U/mL (ref 0–35)

## 2021-12-15 MED ORDER — FILGRASTIM-SNDZ 480 MCG/0.8ML IJ SOSY
480.0000 ug | PREFILLED_SYRINGE | Freq: Once | INTRAMUSCULAR | Status: AC
  Administered 2021-12-15: 480 ug via SUBCUTANEOUS
  Filled 2021-12-15: qty 0.8

## 2021-12-17 ENCOUNTER — Other Ambulatory Visit: Payer: Self-pay | Admitting: Family Medicine

## 2021-12-17 DIAGNOSIS — Z1231 Encounter for screening mammogram for malignant neoplasm of breast: Secondary | ICD-10-CM

## 2021-12-21 ENCOUNTER — Inpatient Hospital Stay: Payer: Medicare Other

## 2021-12-21 ENCOUNTER — Inpatient Hospital Stay (HOSPITAL_BASED_OUTPATIENT_CLINIC_OR_DEPARTMENT_OTHER): Payer: Medicare Other | Admitting: Oncology

## 2021-12-21 DIAGNOSIS — Z95828 Presence of other vascular implants and grafts: Secondary | ICD-10-CM

## 2021-12-21 DIAGNOSIS — C259 Malignant neoplasm of pancreas, unspecified: Secondary | ICD-10-CM

## 2021-12-21 DIAGNOSIS — K8689 Other specified diseases of pancreas: Secondary | ICD-10-CM | POA: Diagnosis not present

## 2021-12-21 DIAGNOSIS — Z5111 Encounter for antineoplastic chemotherapy: Secondary | ICD-10-CM

## 2021-12-21 DIAGNOSIS — G620 Drug-induced polyneuropathy: Secondary | ICD-10-CM

## 2021-12-21 DIAGNOSIS — T451X5A Adverse effect of antineoplastic and immunosuppressive drugs, initial encounter: Secondary | ICD-10-CM

## 2021-12-21 DIAGNOSIS — D6481 Anemia due to antineoplastic chemotherapy: Secondary | ICD-10-CM

## 2021-12-21 LAB — COMPREHENSIVE METABOLIC PANEL
ALT: 25 U/L (ref 0–44)
AST: 29 U/L (ref 15–41)
Albumin: 3.8 g/dL (ref 3.5–5.0)
Alkaline Phosphatase: 91 U/L (ref 38–126)
Anion gap: 6 (ref 5–15)
BUN: 16 mg/dL (ref 8–23)
CO2: 26 mmol/L (ref 22–32)
Calcium: 8.6 mg/dL — ABNORMAL LOW (ref 8.9–10.3)
Chloride: 109 mmol/L (ref 98–111)
Creatinine, Ser: 0.66 mg/dL (ref 0.44–1.00)
GFR, Estimated: >60 mL/min (ref >60)
Glucose, Bld: 99 mg/dL (ref 70–99)
Potassium: 3.6 mmol/L (ref 3.5–5.1)
Sodium: 141 mmol/L (ref 135–145)
Total Bilirubin: 0.5 mg/dL (ref 0.3–1.2)
Total Protein: 6.5 g/dL (ref 6.5–8.1)

## 2021-12-21 LAB — CBC WITH DIFFERENTIAL/PLATELET
Abs Immature Granulocytes: 0.4 10*3/uL — ABNORMAL HIGH (ref 0.00–0.07)
Basophils Absolute: 0.1 10*3/uL (ref 0.0–0.1)
Basophils Relative: 1 %
Eosinophils Absolute: 0.1 10*3/uL (ref 0.0–0.5)
Eosinophils Relative: 2 %
HCT: 34 % — ABNORMAL LOW (ref 36.0–46.0)
Hemoglobin: 11.6 g/dL — ABNORMAL LOW (ref 12.0–15.0)
Immature Granulocytes: 8 %
Lymphocytes Relative: 34 %
Lymphs Abs: 1.7 10*3/uL (ref 0.7–4.0)
MCH: 32.7 pg (ref 26.0–34.0)
MCHC: 34.1 g/dL (ref 30.0–36.0)
MCV: 95.8 fL (ref 80.0–100.0)
Monocytes Absolute: 0.3 10*3/uL (ref 0.1–1.0)
Monocytes Relative: 7 %
Neutro Abs: 2.4 10*3/uL (ref 1.7–7.7)
Neutrophils Relative %: 48 %
Platelets: 188 10*3/uL (ref 150–400)
RBC: 3.55 MIL/uL — ABNORMAL LOW (ref 3.87–5.11)
RDW: 14.5 % (ref 11.5–15.5)
Smear Review: NORMAL
WBC: 4.9 10*3/uL (ref 4.0–10.5)
nRBC: 0 % (ref 0.0–0.2)

## 2021-12-21 MED ORDER — SODIUM CHLORIDE 0.9 % IV SOLN
Freq: Once | INTRAVENOUS | Status: AC
  Filled 2021-12-21: qty 250

## 2021-12-21 MED ORDER — PACLITAXEL PROTEIN-BOUND CHEMO INJECTION 100 MG
100.0000 mg/m2 | Freq: Once | INTRAVENOUS | Status: AC
  Administered 2021-12-21: 175 mg via INTRAVENOUS
  Filled 2021-12-21: qty 35

## 2021-12-21 MED ORDER — PROCHLORPERAZINE MALEATE 10 MG PO TABS
10.0000 mg | ORAL_TABLET | Freq: Once | ORAL | Status: AC
  Administered 2021-12-21: 10 mg via ORAL
  Filled 2021-12-21: qty 1

## 2021-12-21 MED ORDER — HEPARIN SOD (PORK) LOCK FLUSH 100 UNIT/ML IV SOLN
500.0000 [IU] | Freq: Once | INTRAVENOUS | Status: AC | PRN
  Administered 2021-12-21: 500 [IU]
  Filled 2021-12-21: qty 5

## 2021-12-21 MED ORDER — SODIUM CHLORIDE 0.9% FLUSH
10.0000 mL | Freq: Once | INTRAVENOUS | Status: AC
  Administered 2021-12-21: 10 mL via INTRAVENOUS
  Filled 2021-12-21: qty 10

## 2021-12-21 MED ORDER — SODIUM CHLORIDE 0.9 % IV SOLN
1000.0000 mg/m2 | Freq: Once | INTRAVENOUS | Status: AC
  Administered 2021-12-21: 1748 mg via INTRAVENOUS
  Filled 2021-12-21: qty 21.04

## 2021-12-21 NOTE — Assessment & Plan Note (Signed)
Chemotherapy plan as listed above 

## 2021-12-21 NOTE — Assessment & Plan Note (Signed)
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  Pathology proved liver metastatic disease- additional chemotherapy --> whipple procedure.--> adjuvant Gem/Abraxane ypT1b ypN0 Labs reviewed and discussed with patient Proceed with cycle 5 day 1 gemcitabine/Abraxane.-D2 Zarxio. proceed with D8 treatment today. D9 Zarxio [2 weeks on 1 week off gemcitabine/Abraxane with G-CSF support.]

## 2021-12-21 NOTE — Assessment & Plan Note (Signed)
fingertips and toes, grade 2.  Not tolerating Cymbalta.  Stable symptoms. Acupuncture sessions as needed.Marland Kitchen

## 2021-12-21 NOTE — Progress Notes (Signed)
Pt reports N/V this morning after breakfast and feeling gassy. Denies fever or any other symptoms. Was experiencing back pain earlier but has resolved.

## 2021-12-22 ENCOUNTER — Inpatient Hospital Stay: Payer: Medicare Other

## 2021-12-22 DIAGNOSIS — Z5111 Encounter for antineoplastic chemotherapy: Secondary | ICD-10-CM | POA: Diagnosis not present

## 2021-12-22 DIAGNOSIS — D709 Neutropenia, unspecified: Secondary | ICD-10-CM

## 2021-12-22 MED ORDER — FILGRASTIM-SNDZ 480 MCG/0.8ML IJ SOSY
480.0000 ug | PREFILLED_SYRINGE | Freq: Once | INTRAMUSCULAR | Status: AC
  Administered 2021-12-22: 480 ug via SUBCUTANEOUS
  Filled 2021-12-22: qty 0.8

## 2022-01-05 ENCOUNTER — Inpatient Hospital Stay (HOSPITAL_BASED_OUTPATIENT_CLINIC_OR_DEPARTMENT_OTHER): Payer: Medicare Other | Admitting: Oncology

## 2022-01-05 ENCOUNTER — Encounter: Payer: Self-pay | Admitting: Oncology

## 2022-01-05 ENCOUNTER — Inpatient Hospital Stay: Payer: Medicare Other | Attending: Oncology

## 2022-01-05 ENCOUNTER — Inpatient Hospital Stay: Payer: Medicare Other

## 2022-01-05 DIAGNOSIS — Z5111 Encounter for antineoplastic chemotherapy: Secondary | ICD-10-CM | POA: Diagnosis not present

## 2022-01-05 DIAGNOSIS — K8689 Other specified diseases of pancreas: Secondary | ICD-10-CM

## 2022-01-05 DIAGNOSIS — C250 Malignant neoplasm of head of pancreas: Secondary | ICD-10-CM | POA: Insufficient documentation

## 2022-01-05 DIAGNOSIS — G620 Drug-induced polyneuropathy: Secondary | ICD-10-CM

## 2022-01-05 DIAGNOSIS — C259 Malignant neoplasm of pancreas, unspecified: Secondary | ICD-10-CM | POA: Diagnosis not present

## 2022-01-05 DIAGNOSIS — T451X5A Adverse effect of antineoplastic and immunosuppressive drugs, initial encounter: Secondary | ICD-10-CM

## 2022-01-05 DIAGNOSIS — C787 Secondary malignant neoplasm of liver and intrahepatic bile duct: Secondary | ICD-10-CM | POA: Diagnosis present

## 2022-01-05 DIAGNOSIS — Z5189 Encounter for other specified aftercare: Secondary | ICD-10-CM | POA: Insufficient documentation

## 2022-01-05 DIAGNOSIS — D6481 Anemia due to antineoplastic chemotherapy: Secondary | ICD-10-CM

## 2022-01-05 LAB — COMPREHENSIVE METABOLIC PANEL
ALT: 20 U/L (ref 0–44)
AST: 26 U/L (ref 15–41)
Albumin: 3.7 g/dL (ref 3.5–5.0)
Alkaline Phosphatase: 82 U/L (ref 38–126)
Anion gap: 7 (ref 5–15)
BUN: 14 mg/dL (ref 8–23)
CO2: 24 mmol/L (ref 22–32)
Calcium: 8.5 mg/dL — ABNORMAL LOW (ref 8.9–10.3)
Chloride: 108 mmol/L (ref 98–111)
Creatinine, Ser: 0.74 mg/dL (ref 0.44–1.00)
GFR, Estimated: >60 mL/min (ref >60)
Glucose, Bld: 98 mg/dL (ref 70–99)
Potassium: 3.9 mmol/L (ref 3.5–5.1)
Sodium: 139 mmol/L (ref 135–145)
Total Bilirubin: 0.6 mg/dL (ref 0.3–1.2)
Total Protein: 6.2 g/dL — ABNORMAL LOW (ref 6.5–8.1)

## 2022-01-05 LAB — CBC WITH DIFFERENTIAL/PLATELET
Abs Immature Granulocytes: 0.01 10*3/uL (ref 0.00–0.07)
Basophils Absolute: 0 10*3/uL (ref 0.0–0.1)
Basophils Relative: 1 %
Eosinophils Absolute: 0.2 10*3/uL (ref 0.0–0.5)
Eosinophils Relative: 5 %
HCT: 31.3 % — ABNORMAL LOW (ref 36.0–46.0)
Hemoglobin: 10.5 g/dL — ABNORMAL LOW (ref 12.0–15.0)
Immature Granulocytes: 0 %
Lymphocytes Relative: 21 %
Lymphs Abs: 1.1 10*3/uL (ref 0.7–4.0)
MCH: 33.3 pg (ref 26.0–34.0)
MCHC: 33.5 g/dL (ref 30.0–36.0)
MCV: 99.4 fL (ref 80.0–100.0)
Monocytes Absolute: 0.6 10*3/uL (ref 0.1–1.0)
Monocytes Relative: 12 %
Neutro Abs: 3.2 10*3/uL (ref 1.7–7.7)
Neutrophils Relative %: 61 %
Platelets: 348 10*3/uL (ref 150–400)
RBC: 3.15 MIL/uL — ABNORMAL LOW (ref 3.87–5.11)
RDW: 17.1 % — ABNORMAL HIGH (ref 11.5–15.5)
WBC: 5.1 10*3/uL (ref 4.0–10.5)
nRBC: 0 % (ref 0.0–0.2)

## 2022-01-05 MED ORDER — SODIUM CHLORIDE 0.9% FLUSH
10.0000 mL | INTRAVENOUS | Status: DC | PRN
  Administered 2022-01-05: 10 mL
  Filled 2022-01-05: qty 10

## 2022-01-05 MED ORDER — PROCHLORPERAZINE MALEATE 10 MG PO TABS
10.0000 mg | ORAL_TABLET | Freq: Once | ORAL | Status: AC
  Administered 2022-01-05: 10 mg via ORAL
  Filled 2022-01-05: qty 1

## 2022-01-05 MED ORDER — HEPARIN SOD (PORK) LOCK FLUSH 100 UNIT/ML IV SOLN
500.0000 [IU] | Freq: Once | INTRAVENOUS | Status: AC | PRN
  Administered 2022-01-05: 500 [IU]
  Filled 2022-01-05: qty 5

## 2022-01-05 MED ORDER — PACLITAXEL PROTEIN-BOUND CHEMO INJECTION 100 MG
100.0000 mg/m2 | Freq: Once | INTRAVENOUS | Status: AC
  Administered 2022-01-05: 175 mg via INTRAVENOUS
  Filled 2022-01-05: qty 35

## 2022-01-05 MED ORDER — SODIUM CHLORIDE 0.9 % IV SOLN
1000.0000 mg/m2 | Freq: Once | INTRAVENOUS | Status: AC
  Administered 2022-01-05: 1748 mg via INTRAVENOUS
  Filled 2022-01-05: qty 26.3

## 2022-01-05 MED ORDER — SODIUM CHLORIDE 0.9 % IV SOLN
Freq: Once | INTRAVENOUS | Status: AC
  Filled 2022-01-05: qty 250

## 2022-01-05 NOTE — Assessment & Plan Note (Signed)
Chemotherapy plan as listed above 

## 2022-01-05 NOTE — Assessment & Plan Note (Signed)
status post Whipple procedure..  Continue Creon, continue 24000 units TID with meals and  with her snacks 

## 2022-01-05 NOTE — Assessment & Plan Note (Signed)
fingertips and toes, grade 2.  She did not tolerate Cymbalta.   Stable symptoms. Acupuncture sessions as needed.Marland Kitchen

## 2022-01-05 NOTE — Assessment & Plan Note (Addendum)
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  Pathology proved liver metastatic disease- additional chemotherapy --> whipple procedure.--> adjuvant Gem/Abraxane ypT1b ypN0 Labs reviewed and discussed with patient Proceed with cycle 6 day 1 gemcitabine/Abraxane.-D2 Zarxio.  She is not available to do treatment next week.  She will follow-up in 2 weeks for day 15 treatment. [2 weeks on 1 week off gemcitabine/Abraxane with G-CSF support.]

## 2022-01-05 NOTE — Assessment & Plan Note (Signed)
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 Continue calcium supplementation

## 2022-01-05 NOTE — Assessment & Plan Note (Signed)
Hemoglobin is stable.  Monitor. 

## 2022-01-05 NOTE — Progress Notes (Signed)
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  Pathology proved liver metastatic disease- additional chemotherapy --> whipple procedure.--> adjuvant Gem/Abraxane ypT1b ypN0 Labs reviewed and discussed with patient Proceed with cycle 6 day 1 gemcitabine/Abraxane.-D2 Zarxio.  She is not available to do treatment next week.  She will follow-up in 2 weeks for day 15 treatment. [2 weeks on 1 week off gemcitabine/Abraxane with G-CSF support.]   Encounter for antineoplastic chemotherapy Chemotherapy plan as listed above  Chemotherapy-induced neuropathy (HCC) fingertips and toes, grade 2.  She did not tolerate Cymbalta.   Stable symptoms. Acupuncture sessions as needed..   Pancreatic insufficiency  status post Whipple procedure..  Continue Creon, continue 24000 units TID with meals and  with her snacks  Anemia due to antineoplastic chemotherapy Hemoglobin is stable. Monitor.   Hypocalcemia Likely due to malabsorption. Continue calcium supplementation  No orders of the defined types were placed in this encounter.  Follow up 2 weeks lab MD Gem+ Abraxane, D2 Zarxio  All questions were answered. The patient knows to call the clinic with any problems, questions or concerns.  [name], MD, PhD [facility] Hematology Oncology 01/05/2022   [name], MD  CHIEF COMPLAINTS/REASON FOR VISIT:  Follow up for treatment of pancreatic adenocarcinoma  HISTORY OF PRESENTING ILLNESS:   KALANA YUST is a  66 y.o.  female with  PMH listed below was seen in consultation at the request of  [name], MD  for evaluation of pancreatic adenocarcinoma Patient initially presented with jaundice, transaminitis, bilirubin was 9.9.  CA 19-9 was 1874.  Patient also reports unintentional weight loss. 02/08/2020 MRI abdomen and MRCP with and without contrast was done at [facility] which showed pancreatic head mass measuring up to 3 cm, with marked associated narrowing of the portal vein confluence.  SMA is preserved.  Marked intrahepatic and extrahepatic biliary duct dilatation as well as mild dilatation of the main pancreatic duct. Multiple hepatic masses highly concerning for metastatic disease.  Patient underwent EUS on 02/19/2020, which showed irregular mass identified in the pancreatic head, hypoechoic, measured 29mx33mm, sonographic evidence concerning for invasion into the superior mesenteric artery.  There is no sign of significant abnormality in the main pancreatic duct.  Dilatation of common bile duct which measured up to 16 mm.  Region of celiac artery was visualized and showed no signs of significant abnormality.  No lymphadenopathy.  FNA showed adenocarcinoma.  02/19/2020, ERCP, malignant.  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  The stricture was treated with placement of wall flex metal stent.  [facility] ZPia Mauand was recommended for 3 drug regimen FOLFIRINOX.  [facility]  Patient was referred to establish care today. She denies any pain.  Since stent placement, skin jaundice has improved.  Itchiness has also improved. Patient was accompanied by her husband today.  She has a family history of breast cancer in sister and paternal aunt, colon cancer paternal grandmother.  #No reportable targetable mutation on NGS 9/14/2021cycle 1 FOLFIRINOX.  Patient received oxaliplatin and about 50% of  Irinotecan on day 1 and had experienced neurologic symptoms.  She went to ER and working diagnosis is TIA, and eventually I think this is due to irinotecan side effects -irinotecan-associated dysarthria, lip/tongue numbness.  Adjustment was made for Irinotecan to be  infused over 180 minutes.  Atropine 0.5 mg once prior to the irinotecan. No recurrent symptoms.   # 03/19/2020-03/21/2020 patient was admitted due to sepsis with strep pneumonia bacteremia.  Patient was treated with IV Rocephin.  TEE was done which showed no vegetation.  No PFO or ASD.  Patient was discharged home and he finished full course of 14 days of IV Rocephin on 04/02/2020 per ID recommendation.  Repeat blood culture was also negative.  08/25/20 cycle 10 FOLFIRINOX 09/08/20- present  Starting cycle 11, FOLFIRI, Oxaliplatin discontinued due to neuropathy   #NGS showed no reportable targetable mutation #Genetic testing-[employer] diagnostic testing showed no pathological variants identified. MS stable, TMB 60mt/mb, KRAS G12D, SF3B1 K700E, TP53 [id number]  #01/14/2021  [facility] disease.  # Her [facility] [TDayton Scrapeere 4~5 liver lesions on OSH MRI last year at the time of diagnosis highly suspicious for metastasis. She is not eligible for surgical protocol for metastasis resection given the number of liver metastasis is >3. However given her excellent and durable response to chemo, surgical resection may be considered pending sustained disease control at 1 year and may require partial hepatectomy first to see if there is viable tumor before proceeding to Whipple resection ]  Patient had a COVID-19 infection in September 2022.  03/23/2021 CT abdomen pelvis No significant change in the ill-defined pancreatic head mass or  associated biliary ductal and pancreatic ductal dilation.  Slight interval enlargement of subcentimeter anterior peripancreatic  lymph node,  nonspecific. Attention on follow-up per clinical protocol. Similar enlargement of the ascending thoracic aorta measuring 4.4 cm  03/23/2021 MRI abdomen w and wo  Increasing ill-defined hypoenhancement of the pancreatic head surrounding the common bile duct stent, in keeping with known pancreatic malignancy. Anterior peripancreatic lymph node is better evaluated on CT.  Unchanged position of a common bile duct stent with similar mild diffuse intrahepatic ductal dilation and pneumobilia. No evidence of metastatic disease in the abdomen or pelvis. Partially visualized cystic left adnexal lesion measuring up to 3.9 cm.   04[facility]10/2021, [facility]  [facility]. Segments 3 partial hepatectomy, negative for viable tumor. Section 5/6 partial hepatectomy part 1 and 2, microscopic foci of residual viable adenocarcinoma, morphologically consistent with history of pancreatic primary.  Parenchymal margin is uninvolved.  #Patient resumed on FOLFIRI on 04/28/2021. She got another cycle on 05/12/2021 #05/26/2021, patient did not get additional chemotherapy due to transaminitis.  Shared decision was made to stop FOLFIRI and switch to gemcitabine Abraxane treatments.  05/26/2021, patient developed transaminitis, AST 763, ALT 691, alkaline phosphatase 364.  Bilirubin 0.7 05/26/2021 stat ultrasound abdomen right upper quadrant showed interval development of 3.3 x 1.8 cm complex mass in the right hepatic lobe.  Increased extrahepatic ductal dilatation.  Concerning for CBD obstruction. 05/29/2021, MRI abdomen MRCP with and without contrast showed unchanged pancreatic head soft tissue.  Severe intra and extrahepatic biliary ductal dilatation.  Common bile duct stents remain in position however patency is not established.  No evidence of lymphadenopathy or metastatic disease in the abdomen.  With further communication with radiologist,  addendum was added  that there is internal fluid signal and no appreciable associated contrast enhancement measuring 3.4 x 2.3 cm.  This appearance is generally not consistent with metastasis and is of uncertain significance.  Possibly reflecting hepatic abscess or residual of subcapsular hematoma.  Patient was seen by [facility] Dr. Alwyn Pea team.  He had ERCP done on 06/05/2021, with findings of One stent from the biliary tree was seen in the major papilla. The stent had migrated significantly into the duodenum. This is the cause of stent malfunction. One stent was removed from the biliary tree. Prior biliary sphincterotomy appeared open. - A single severe biliary stricture was found in the lower third of the main bile duct with upstream dilation. The stricture was alignant appearing.- The biliary tree was swept and sludge was found.- One fully covered metal stent was placed into the common bile duct across the stricture.- No pancreatogram performed.  06/12/2021-08/10/2021 gemcitabine and Abraxane.   10/14/2021, patient underwent Whipple procedure. Liver biopsy negative for malignancy. Review procedure showed invasive adenocarcinoma, moderately differentiated, centered in pancreatic head and confined in the Pancreas. Surgical margin is negative for malignancy.  36 lymph nodes were all negative for malignancy.  1 hepatic artery lymph node was harvested and was negative.  Gallbladder negative for malignancy.  Cystic duct excision negative for malignancy. pT1b pN0  10/25/2021, CT abdomen pelvis with contrast showed rim-enhancing fluid collection in the region of the hepatic hilum. Patient had a readmission for superficial wound infection.  She completed antibiotics on 11/03/2021.  11/03/2021, CT abdomen pelvis with contrast showed near resolution of previously seen fluid collection in the region of the hepatic hilum.  Similar appearance of the area of hypoenhancement in the liver.  Attention on follow-up.  11/17/2021, resumed on  gemcitabine/Abraxane.  INTERVAL HISTORY LESLIEANN WHISMAN is a 66 y.o. female who has above history reviewed by me today presents for pancreatic cancer.   Patient tolerated chemotherapy well.  Denies any fever, chills, nausea vomiting. She takes pancreatic enzymes.  Intermittent diarrhea after chemo, manageable symptoms.   Review of Systems  Constitutional:  Negative for appetite change, chills, fatigue, fever and unexpected weight change.  HENT:   Negative for hearing loss and voice change.   Eyes:  Negative for eye problems.  Respiratory:  Negative for chest tightness and cough.   Cardiovascular:  Negative for chest pain.  Gastrointestinal:  Negative for abdominal distention, abdominal pain, blood in stool and nausea.  Endocrine: Negative for hot flashes.  Genitourinary:  Negative for difficulty urinating and frequency.   Musculoskeletal:  Negative for arthralgias.  Skin:  Negative for itching and rash.  Neurological:  Positive for numbness. Negative for extremity weakness.  Hematological:  Negative for adenopathy.  Psychiatric/Behavioral:  Negative for confusion.     MEDICAL HISTORY:  Past Medical History:  Diagnosis Date   Anemia    Cancer ([place])    pancreatic cancer   Colon polyps    Family history of breast cancer    Neuropathy due to drug ([place])    chemo induced   Neutropenia ([place]) 04/18/2020   Osteopenia after menopause 05/2017   femoral neck T score -2.0   Personal history of chemotherapy    current for pancreatic ca   PONV (postoperative nausea and vomiting)    Pure hypercholesterolemia     SURGICAL HISTORY: Past Surgical History:  Procedure Laterality Date   COLONOSCOPY  07/2015   WNL   COLONOSCOPY  2008/2011   COLONOSCOPY WITH PROPOFOL N/A 07/10/2021  Procedure: COLONOSCOPY WITH PROPOFOL;  Surgeon: [name], MD;  Location: [facility] ENDOSCOPY;  Service: Endoscopy;  Laterality: N/A;   IR CV LINE INJECTION  04/28/2021   OVARIAN CYST REMOVAL  1992    dermoid-Dr CAK   PORTA CATH INSERTION N/A 03/07/2020   Procedure: PORTA CATH INSERTION;  Surgeon: Algernon Huxley, MD;  Location: [facility] CV LAB;  Service: Cardiovascular;  Laterality: N/A;   TEE WITHOUT CARDIOVERSION N/A 03/21/2020   Procedure: TRANSESOPHAGEAL ECHOCARDIOGRAM (TEE);  Surgeon: Dionisio David, MD;  Location: [facility] ORS;  Service: Cardiovascular;  Laterality: N/A;   TUBAL LIGATION  1993    SOCIAL HISTORY: Social History   Socioeconomic History   Marital status: Married    Spouse name: Not on file   Number of children: 2   Years of education: Not on file   Highest education level: Not on file  Occupational History   Occupation: Pharmacist, hospital    Comment: retired   Occupation: Psychologist, sport and exercise  Tobacco Use   Smoking status: Former    Packs/day: 1.00    Years: 12.00    Total pack years: 12.00    Types: Cigarettes    Quit date: 1987    Years since quitting: 36.5   Smokeless tobacco: Never   Tobacco comments:    Quit smoking 1987  Vaping Use   Vaping Use: Never used  Substance and Sexual Activity   Alcohol use: Not Currently    Alcohol/week: 7.0 standard drinks of alcohol    Types: 7 Glasses of wine per week    Comment: 0-2 mixed drinks a day   Drug use: No   Sexual activity: Not Currently    Birth control/protection: Post-menopausal  Other Topics Concern   Not on file  Social History Narrative   Not on file   Social Determinants of Health   Financial Resource Strain: Low Risk  (10/08/2021)   Overall Financial Resource Strain (CARDIA)    Difficulty of Paying Living Expenses: Not hard at all  Food Insecurity: No Food Insecurity (10/08/2021)   Hunger Vital Sign    Worried About Running Out of Food in the Last Year: Never true    Ran Out of Food in the Last Year: Never true  Transportation Needs: No Transportation Needs (10/08/2021)   PRAPARE - Hydrologist (Medical): No    Lack of Transportation (Non-Medical): No  Physical Activity:  Sufficiently Active (10/08/2021)   Exercise Vital Sign    Days of Exercise per Week: 5 days    Minutes of Exercise per Session: 40 min  Stress: No Stress Concern Present (10/08/2021)   [place]    Feeling of Stress : Not at all  Social Connections: Moderately Isolated (10/08/2021)   Social Connection and Isolation Panel [NHANES]    Frequency of Communication with Friends and Family: More than three times a week    Frequency of Social Gatherings with Friends and Family: Three times a week    Attends Religious Services: Never    Active Member of Clubs or Organizations: No    Attends Archivist Meetings: Never    Marital Status: Married  Human resources officer Violence: Not At Risk (10/08/2021)   Humiliation, Afraid, Rape, and Kick questionnaire    Fear of Current or Ex-Partner: No    Emotionally Abused: No    Physically Abused: No    Sexually Abused: No    FAMILY HISTORY: Family History  Problem  Relation Age of Onset   Breast cancer Paternal Aunt 63   Diabetes Mother    Osteoporosis Mother    Hyperlipidemia Father    Rheumatic fever Father    Valvular heart disease Father    Cancer Paternal Grandmother        possible colon   Breast cancer Sister 38    ALLERGIES:  is allergic to penicillin g.  MEDICATIONS:  Current Outpatient Medications  Medication Sig Dispense Refill   Calcium Carbonate-Vit D-Min (CALCIUM 1200 PO) Take by mouth.     Cholecalciferol 25 MCG (1000 UT) tablet Take 2,000 Units by mouth daily.     CREON 24000-76000 units CPEP TAKE 1 CAPSULE (24,000 UNITS TOTAL) BY MOUTH 3 (THREE) TIMES DAILY BEFORE MEALS. 200 capsule 3   Cyanocobalamin (B-12 PO) Take by mouth.     lidocaine-prilocaine (EMLA) cream APPLY TO AFFECTED AREA ONCE AS DIRECTED     Multiple Vitamins-Minerals (MULTIVITAMIN WITH MINERALS) tablet Take 1 tablet by mouth daily.     ondansetron (ZOFRAN-ODT) 4 MG disintegrating tablet Take  4 mg by mouth every 8 (eight) hours as needed.     prochlorperazine (COMPAZINE) 10 MG tablet TAKE 1 TABLET (10 MG TOTAL) BY MOUTH EVERY 6 (SIX) HOURS AS NEEDED (NAUSEA OR VOMITING).     clobetasol cream (TEMOVATE) 0.05 % once daily as needed (Patient not taking: Reported on 11/09/2021)     simethicone (GAS-X) 80 MG chewable tablet Chew 1 tablet (80 mg total) by mouth every 8 (eight) hours as needed for flatulence. (Patient not taking: Reported on 11/09/2021) 60 tablet 0   No current facility-administered medications for this visit.   Facility-Administered Medications Ordered in Other Visits  Medication Dose Route Frequency Provider Last Rate Last Admin   heparin lock flush 100 UNIT/ML injection            prochlorperazine (COMPAZINE) 10 MG tablet            sodium chloride flush (NS) 0.9 % injection 10 mL  10 mL Intravenous PRN [name], MD   10 mL at 02/18/21 0858   sodium chloride flush (NS) 0.9 % injection 10 mL  10 mL Intravenous Once Borders, Kirt Boys, NP       sodium chloride flush (NS) 0.9 % injection 10 mL  10 mL Intravenous Once [name], MD       sodium chloride flush (NS) 0.9 % injection 10 mL  10 mL Intracatheter PRN [name], MD   10 mL at 01/05/22 0912     PHYSICAL EXAMINATION: ECOG PERFORMANCE STATUS: 0 - Asymptomatic Vitals:   01/05/22 0840  BP: 116/79  Pulse: (!) 57  Temp: 98.3 F (36.8 C)   Filed Weights   01/05/22 0840  Weight: 136 lb (61.7 kg)    Physical Exam Constitutional:      General: She is not in acute distress. HENT:     Head: Normocephalic and atraumatic.  Eyes:     General: No scleral icterus. Cardiovascular:     Rate and Rhythm: Normal rate and regular rhythm.     Heart sounds: Normal heart sounds.  Pulmonary:     Effort: Pulmonary effort is normal. No respiratory distress.     Breath sounds: No wheezing.  Abdominal:     General: Bowel sounds are normal. There is no distension.     Palpations: Abdomen is soft.  Musculoskeletal:         General: No deformity. Normal range of motion.  Cervical back: Normal range of motion and neck supple.  Skin:    General: Skin is warm and dry.     Coloration: Skin is not jaundiced.     Findings: No erythema or rash.  Neurological:     Mental Status: She is alert and oriented to person, place, and time. Mental status is at baseline.     Cranial Nerves: No cranial nerve deficit.     Coordination: Coordination normal.  Psychiatric:        Mood and Affect: Mood normal.     LABORATORY DATA:  I have reviewed the data as listed    Latest Ref Rng & Units 01/05/2022    8:18 AM 12/21/2021    8:32 AM 12/14/2021    8:42 AM  CBC  WBC 4.0 - 10.5 K/uL 5.1  4.9  4.2   Hemoglobin 12.0 - 15.0 g/dL 10.5  11.6  11.9   Hematocrit 36.0 - 46.0 % 31.3  34.0  35.5   Platelets 150 - 400 K/uL 348  188  183       Latest Ref Rng & Units 01/05/2022    8:18 AM 12/21/2021    8:32 AM 12/14/2021    8:42 AM  CMP  Glucose 70 - 99 mg/dL 98  99  101   BUN 8 - 23 mg/dL _0 Creatinine 0.44 - 1.00 mg/dL 0.74  0.66  0.71   Sodium 135 - 145 mmol/L 139  141  141   Potassium 3.5 - 5.1 mmol/L 3.9  3.6  3.7   Chloride 98 - 111 mmol/L 108  109  110   CO2 22 - 32 mmol/L _1 Calcium 8.9 - 10.3 mg/dL 8.5  8.6  8.6   Total Protein 6.5 - 8.1 g/dL 6.2  6.5  6.7   Total Bilirubin 0.3 - 1.2 mg/dL 0.6  0.5  0.5   Alkaline Phos 38 - 126 U/L 82  91  86   AST 15 - 41 U/L _2 ALT 0 - 44 U/L _3 RADIOGRAPHIC STUDIES: I have personally reviewed the radiological images as listed and agreed with the findings in the report. Reviewed findings of MRI abdomen [facility]CP done at [facility]. No results found.

## 2022-01-05 NOTE — Patient Instructions (Signed)
[facility] CANCER CTR AT [place]  Discharge Instructions: Thank you for choosing [place] to provide your oncology and hematology care.  If you have a lab appointment with the [place], please go directly to the [place] and check in at the registration area.  Wear comfortable clothing and clothing appropriate for easy access to any Portacath or PICC line.   We strive to give you quality time with your provider. You may need to reschedule your appointment if you arrive late (15 or more minutes).  Arriving late affects you and other patients whose appointments are after yours.  Also, if you miss three or more appointments without notifying the office, you may be dismissed from the clinic at the provider's discretion.      For prescription refill requests, have your pharmacy contact our office and allow 72 hours for refills to be completed.    Today you received the following chemotherapy and/or immunotherapy agents Abraxane & Gemzar.       To help prevent nausea and vomiting after your treatment, we encourage you to take your nausea medication as directed.  BELOW ARE SYMPTOMS THAT SHOULD BE REPORTED IMMEDIATELY: *FEVER GREATER THAN 100.4 F (38 C) OR HIGHER *CHILLS OR SWEATING *NAUSEA AND VOMITING THAT IS NOT CONTROLLED WITH YOUR NAUSEA MEDICATION *UNUSUAL SHORTNESS OF BREATH *UNUSUAL BRUISING OR BLEEDING *URINARY PROBLEMS (pain or burning when urinating, or frequent urination) *BOWEL PROBLEMS (unusual diarrhea, constipation, pain near the anus) TENDERNESS IN MOUTH AND THROAT WITH OR WITHOUT PRESENCE OF ULCERS (sore throat, sores in mouth, or a toothache) UNUSUAL RASH, SWELLING OR PAIN  UNUSUAL VAGINAL DISCHARGE OR ITCHING   Items with * indicate a potential emergency and should be followed up as soon as possible or go to the Emergency Department if any problems should occur.  Please show the CHEMOTHERAPY ALERT CARD or IMMUNOTHERAPY ALERT CARD at  check-in to the Emergency Department and triage nurse.  Should you have questions after your visit or need to cancel or reschedule your appointment, please contact [facility] CANCER [place] AT [place]  [phone] and follow the prompts.  Office hours are 8:00 a.m. to 4:30 p.m. Monday - Friday. Please note that voicemails left after 4:00 p.m. may not be returned until the following business day.  We are closed weekends and major holidays. You have access to a nurse at all times for urgent questions. Please call the main number to the clinic [phone] and follow the prompts.  For any non-urgent questions, you may also contact your provider using MyChart. We now offer e-Visits for anyone 19 and older to request care online for non-urgent symptoms. For details visit [email or web address].   Also download the MyChart app! Go to the app store, search "MyChart", open the app, select [facility], and log in with your MyChart username and password.  Masks are optional in the cancer centers. If you would like for your care team to wear a mask while they are taking care of you, please let them know. For doctor visits, patients may have with them one support person who is at least 66 years old. At this time, visitors are not allowed in the infusion area.

## 2022-01-06 ENCOUNTER — Inpatient Hospital Stay: Payer: Medicare Other

## 2022-01-06 DIAGNOSIS — Z5111 Encounter for antineoplastic chemotherapy: Secondary | ICD-10-CM | POA: Diagnosis not present

## 2022-01-06 DIAGNOSIS — D709 Neutropenia, unspecified: Secondary | ICD-10-CM

## 2022-01-06 LAB — CANCER ANTIGEN 19-9: CA 19-9: 5 U/mL (ref 0–35)

## 2022-01-06 MED ORDER — FILGRASTIM-SNDZ 480 MCG/0.8ML IJ SOSY
480.0000 ug | PREFILLED_SYRINGE | Freq: Once | INTRAMUSCULAR | Status: AC
  Administered 2022-01-06: 480 ug via SUBCUTANEOUS
  Filled 2022-01-06: qty 0.8

## 2022-01-12 ENCOUNTER — Ambulatory Visit
Admission: RE | Admit: 2022-01-12 | Discharge: 2022-01-12 | Disposition: A | Discharge status: Home / Self Care | Payer: Medicare Other | Source: Ambulatory Visit | Attending: Family Medicine | Admitting: Family Medicine

## 2022-01-12 DIAGNOSIS — Z1231 Encounter for screening mammogram for malignant neoplasm of breast: Secondary | ICD-10-CM | POA: Insufficient documentation

## 2022-01-13 NOTE — Progress Notes (Signed)
Hi Cassandra [name]  Normal mammogram; repeat in 1 year.  Please let us know if you have any questions.  Thank you,  Tally Joe, FNP

## 2022-01-18 ENCOUNTER — Other Ambulatory Visit: Payer: Self-pay

## 2022-01-19 ENCOUNTER — Encounter: Payer: Self-pay | Admitting: Oncology

## 2022-01-19 ENCOUNTER — Inpatient Hospital Stay: Payer: Medicare Other

## 2022-01-19 ENCOUNTER — Inpatient Hospital Stay (HOSPITAL_BASED_OUTPATIENT_CLINIC_OR_DEPARTMENT_OTHER): Payer: Medicare Other | Admitting: Oncology

## 2022-01-19 DIAGNOSIS — T451X5A Adverse effect of antineoplastic and immunosuppressive drugs, initial encounter: Secondary | ICD-10-CM

## 2022-01-19 DIAGNOSIS — Z5111 Encounter for antineoplastic chemotherapy: Secondary | ICD-10-CM

## 2022-01-19 DIAGNOSIS — C259 Malignant neoplasm of pancreas, unspecified: Secondary | ICD-10-CM

## 2022-01-19 DIAGNOSIS — K8689 Other specified diseases of pancreas: Secondary | ICD-10-CM

## 2022-01-19 DIAGNOSIS — D6481 Anemia due to antineoplastic chemotherapy: Secondary | ICD-10-CM

## 2022-01-19 LAB — CBC WITH DIFFERENTIAL/PLATELET
Abs Immature Granulocytes: 0.02 10*3/uL (ref 0.00–0.07)
Basophils Absolute: 0 10*3/uL (ref 0.0–0.1)
Basophils Relative: 1 %
Eosinophils Absolute: 0.3 10*3/uL (ref 0.0–0.5)
Eosinophils Relative: 6 %
HCT: 33.4 % — ABNORMAL LOW (ref 36.0–46.0)
Hemoglobin: 11.1 g/dL — ABNORMAL LOW (ref 12.0–15.0)
Immature Granulocytes: 0 %
Lymphocytes Relative: 23 %
Lymphs Abs: 1.1 10*3/uL (ref 0.7–4.0)
MCH: 33.6 pg (ref 26.0–34.0)
MCHC: 33.2 g/dL (ref 30.0–36.0)
MCV: 101.2 fL — ABNORMAL HIGH (ref 80.0–100.0)
Monocytes Absolute: 0.6 10*3/uL (ref 0.1–1.0)
Monocytes Relative: 12 %
Neutro Abs: 2.9 10*3/uL (ref 1.7–7.7)
Neutrophils Relative %: 58 %
Platelets: 157 10*3/uL (ref 150–400)
RBC: 3.3 MIL/uL — ABNORMAL LOW (ref 3.87–5.11)
RDW: 16.9 % — ABNORMAL HIGH (ref 11.5–15.5)
WBC: 4.9 10*3/uL (ref 4.0–10.5)
nRBC: 0 % (ref 0.0–0.2)

## 2022-01-19 LAB — COMPREHENSIVE METABOLIC PANEL
ALT: 21 U/L (ref 0–44)
AST: 28 U/L (ref 15–41)
Albumin: 3.7 g/dL (ref 3.5–5.0)
Alkaline Phosphatase: 80 U/L (ref 38–126)
Anion gap: 5 (ref 5–15)
BUN: 15 mg/dL (ref 8–23)
CO2: 25 mmol/L (ref 22–32)
Calcium: 8.3 mg/dL — ABNORMAL LOW (ref 8.9–10.3)
Chloride: 106 mmol/L (ref 98–111)
Creatinine, Ser: 0.56 mg/dL (ref 0.44–1.00)
GFR, Estimated: >60 mL/min (ref >60)
Glucose, Bld: 104 mg/dL — ABNORMAL HIGH (ref 70–99)
Potassium: 4.1 mmol/L (ref 3.5–5.1)
Sodium: 136 mmol/L (ref 135–145)
Total Bilirubin: 0.4 mg/dL (ref 0.3–1.2)
Total Protein: 6.7 g/dL (ref 6.5–8.1)

## 2022-01-19 MED ORDER — PROCHLORPERAZINE MALEATE 10 MG PO TABS
10.0000 mg | ORAL_TABLET | Freq: Once | ORAL | Status: AC
  Administered 2022-01-19: 10 mg via ORAL
  Filled 2022-01-19: qty 1

## 2022-01-19 MED ORDER — SODIUM CHLORIDE 0.9 % IV SOLN
1000.0000 mg/m2 | Freq: Once | INTRAVENOUS | Status: AC
  Administered 2022-01-19: 1748 mg via INTRAVENOUS
  Filled 2022-01-19: qty 45.97

## 2022-01-19 MED ORDER — SODIUM CHLORIDE 0.9 % IV SOLN
Freq: Once | INTRAVENOUS | Status: AC
  Filled 2022-01-19: qty 250

## 2022-01-19 MED ORDER — PACLITAXEL PROTEIN-BOUND CHEMO INJECTION 100 MG
100.0000 mg/m2 | Freq: Once | INTRAVENOUS | Status: AC
  Administered 2022-01-19: 175 mg via INTRAVENOUS
  Filled 2022-01-19: qty 35

## 2022-01-19 MED ORDER — HEPARIN SOD (PORK) LOCK FLUSH 100 UNIT/ML IV SOLN
500.0000 [IU] | Freq: Once | INTRAVENOUS | Status: AC | PRN
  Filled 2022-01-19: qty 5

## 2022-01-19 MED ORDER — HEPARIN SOD (PORK) LOCK FLUSH 100 UNIT/ML IV SOLN
INTRAVENOUS | Status: AC
  Administered 2022-01-19: 500 [IU]
  Filled 2022-01-19: qty 5

## 2022-01-19 MED ORDER — SODIUM CHLORIDE 0.9 % IV SOLN
2.0000 g | Freq: Once | INTRAVENOUS | Status: AC
  Administered 2022-01-19: 2 g via INTRAVENOUS
  Filled 2022-01-19: qty 20

## 2022-01-19 NOTE — Patient Instructions (Signed)
[facility] CTR AT [place]  Discharge Instructions: Thank you for choosing [place] [facility] to provide your oncology and hematology care.  If you have a lab appointment with the [place] [facility], please go directly to the [facility] and check in at the registration area.  Wear comfortable clothing and clothing appropriate for easy access to any Portacath or PICC line.   We strive to give you quality time with your provider. You may need to reschedule your appointment if you arrive late (15 or more minutes).  Arriving late affects you and other patients whose appointments are after yours.  Also, if you miss three or more appointments without notifying the office, you may be dismissed from the clinic at the provider's discretion.      For prescription refill requests, have your pharmacy contact our office and allow 72 hours for refills to be completed.    Today you received the following chemotherapy and/or immunotherapy agents Abraxane & gemzar   To help prevent nausea and vomiting after your treatment, we encourage you to take your nausea medication as directed.  BELOW ARE SYMPTOMS THAT SHOULD BE REPORTED IMMEDIATELY: *FEVER GREATER THAN 100.4 F (38 C) OR HIGHER *CHILLS OR SWEATING *NAUSEA AND VOMITING THAT IS NOT CONTROLLED WITH YOUR NAUSEA MEDICATION *UNUSUAL SHORTNESS OF BREATH *UNUSUAL BRUISING OR BLEEDING *URINARY PROBLEMS (pain or burning when urinating, or frequent urination) *BOWEL PROBLEMS (unusual diarrhea, constipation, pain near the anus) TENDERNESS IN MOUTH AND THROAT WITH OR WITHOUT PRESENCE OF ULCERS (sore throat, sores in mouth, or a toothache) UNUSUAL RASH, SWELLING OR PAIN  UNUSUAL VAGINAL DISCHARGE OR ITCHING   Items with * indicate a potential emergency and should be followed up as soon as possible or go to the Emergency Department if any problems should occur.  Please show the CHEMOTHERAPY ALERT CARD or IMMUNOTHERAPY ALERT CARD at check-in  to the Emergency Department and triage nurse.  Should you have questions after your visit or need to cancel or reschedule your appointment, please contact [facility] CANCER [place] AT [place]  [phone] and follow the prompts.  Office hours are 8:00 a.m. to 4:30 p.m. Monday - Friday. Please note that voicemails left after 4:00 p.m. may not be returned until the following business day.  We are closed weekends and major holidays. You have access to a nurse at all times for urgent questions. Please call the main number to the clinic [phone] and follow the prompts.  For any non-urgent questions, you may also contact your provider using MyChart. We now offer e-Visits for anyone 76 and older to request care online for non-urgent symptoms. For details visit [email or web address].   Also download the MyChart app! Go to the app store, search "MyChart", open the app, select [place], and log in with your MyChart username and password.  Masks are optional in the cancer centers. If you would like for your care team to wear a mask while they are taking care of you, please let them know. For doctor visits, patients may have with them one support person who is at least 66 years old. At this time, visitors are not allowed in the infusion area.

## 2022-01-19 NOTE — Assessment & Plan Note (Signed)
Hemoglobin is stable.  Monitor. 

## 2022-01-19 NOTE — Progress Notes (Signed)
Pt here for follow up. Pt reports that she had a biopsy done above her upper lip.

## 2022-01-19 NOTE — Assessment & Plan Note (Signed)
status post Whipple procedure..  Continue Creon, continue 24000 units TID with meals and  with her snacks 

## 2022-01-19 NOTE — Assessment & Plan Note (Signed)
Chemotherapy plan as listed above 

## 2022-01-19 NOTE — Progress Notes (Signed)
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  Pathology proved liver metastatic disease- additional chemotherapy --> whipple procedure.--> adjuvant Gem/Abraxane ypT1b ypN0 Labs reviewed and discussed with patient Proceed with cycle 6 day 15 gemcitabine/Abraxane.-D2 Zarxio.[Treatment delayed due to patient's schedule] [2 weeks on 1 week off gemcitabine/Abraxane with G-CSF support.]   Encounter for antineoplastic chemotherapy Chemotherapy plan as listed above  Hypocalcemia Likely due to malabsorption. Continue calcium supplementation, 2000 mg daily. IV calcium gluconate 2 g x 1 today.  Pancreatic insufficiency  status post Whipple procedure..  Continue Creon, continue 24000 units TID with meals and  with her snacks  Anemia due to antineoplastic chemotherapy Hemoglobin is stable. Monitor.   No orders of the defined types were placed in this encounter.  Follow up 2 weeks lab MD Gem+ Abraxane, D2 Zarxio  All questions were answered. The patient knows to call the clinic with any problems, questions or concerns.  [name], MD, PhD [facility] Hematology Oncology 01/19/2022   [name], [name], MD  CHIEF COMPLAINTS/REASON FOR VISIT:  Follow up for treatment of pancreatic adenocarcinoma  HISTORY OF PRESENTING ILLNESS:   Cassandra [name] is a  66 y.o.  female with PMH listed below was seen in consultation at the request of  [name], MD  for evaluation of pancreatic adenocarcinoma Patient initially presented  with jaundice, transaminitis, bilirubin was 9.9.  CA 19-9 was 1874.  Patient also reports unintentional weight loss. 02/08/2020 MRI abdomen and MRCP with and without contrast was done at [facility] which showed pancreatic head mass measuring up to 3 cm, with marked associated narrowing of the portal vein confluence.  SMA is preserved.  Marked intrahepatic and extrahepatic biliary duct dilatation as well as mild dilatation of the main pancreatic duct. Multiple hepatic masses highly concerning for metastatic disease.  Patient underwent EUS on 02/19/2020, which showed irregular mass identified in the pancreatic head, hypoechoic, measured 35mmx33mm, sonographic evidence concerning for invasion into the superior mesenteric artery.  There is no sign of significant abnormality in the main pancreatic duct.  Dilatation of common bile duct which measured up to 16 mm.  Region of celiac artery was visualized and showed no signs of significant abnormality.  No lymphadenopathy.  FNA showed adenocarcinoma.  02/19/2020, ERCP, malignant.  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  The stricture was treated with placement of wall flex metal stent.  Patient was seen by [facility] oncology Dr. Pia Mau and was recommended for 3 drug regimen FOLFIRINOX.  Patient prefers to do chemotherapy locally at [facility].  Patient was referred to establish care today. She denies any pain.  Since stent placement, skin jaundice has improved.  Itchiness has also improved. Patient was accompanied by her husband today.  She has a family history of breast cancer in sister and paternal aunt, colon cancer paternal grandmother.  #No reportable targetable mutation on NGS 9/14/2021cycle 1 FOLFIRINOX.  Patient received oxaliplatin and about 50% of Irinotecan on day 1 and had experienced neurologic symptoms.  She went to ER and working diagnosis is TIA, and eventually I  think this is due to irinotecan side  effects -irinotecan-associated dysarthria, lip/tongue numbness.  Adjustment was made for Irinotecan to be  infused over 180 minutes.  Atropine 0.5 mg once prior to the irinotecan. No recurrent symptoms.   # 03/19/2020-03/21/2020 patient was admitted due to sepsis with strep pneumonia bacteremia.  Patient was treated with IV Rocephin.  TEE was done which showed no vegetation.  No PFO or ASD.  Patient was discharged home and he finished full course of 14 days of IV Rocephin on 04/02/2020 per ID recommendation.  Repeat blood culture was also negative.  08/25/20 cycle 10 FOLFIRINOX 09/08/20- present  Starting cycle 11, FOLFIRI, Oxaliplatin discontinued due to neuropathy   #NGS showed no reportable targetable mutation #Genetic testing-[employer] diagnostic testing showed no pathological variants identified. MS stable, TMB 12mut/mb, KRAS G12D, SF3B1 K700E, TP53 V250fs*30  #01/14/2021  CT done at [facility] during the interval, stable disease.  # Her case was presented by Dr.Jia from [facility] tumor board. Clayborne Artist were 4~5 liver lesions on OSH MRI last year at the time of diagnosis highly suspicious for metastasis. She is not eligible for surgical protocol for metastasis resection given the number of liver metastasis is >3. However given her excellent and durable response to chemo, surgical resection may be considered pending sustained disease control at 1 year and may require partial hepatectomy first to see if there is viable tumor before proceeding to Whipple resection ]  Patient had a COVID-19 infection in September 2022.  03/23/2021 CT abdomen pelvis No significant change in the ill-defined pancreatic head mass or  associated biliary ductal and pancreatic ductal dilation.  Slight interval enlargement of subcentimeter anterior peripancreatic  lymph node, nonspecific. Attention on follow-up per clinical protocol. Similar enlargement of the ascending thoracic aorta measuring 4.4 cm  03/23/2021 MRI abdomen w and wo   Increasing ill-defined hypoenhancement of the pancreatic head surrounding the common bile duct stent, in keeping with known pancreatic malignancy. Anterior peripancreatic lymph node is better evaluated on CT.  Unchanged position of a common bile duct stent with similar mild diffuse intrahepatic ductal dilation and pneumobilia. No evidence of metastatic disease in the abdomen or pelvis. Partially visualized cystic left adnexal lesion measuring up to 3.9 cm.   04/10/2021, patient underwent liver resection at [facility], by Dr. [name] Ducking.   Pathology report from [facility] was reviewed. Segments 3 partial hepatectomy, negative for viable tumor. Section 5/6 partial hepatectomy part 1 and 2, microscopic foci of residual viable adenocarcinoma, morphologically consistent with history of pancreatic primary.  Parenchymal margin is uninvolved.  #Patient resumed on FOLFIRI on 04/28/2021. She got another cycle on 05/12/2021 #05/26/2021, patient did not get additional chemotherapy due to transaminitis.  Shared decision was made to stop FOLFIRI and switch to gemcitabine Abraxane treatments.  05/26/2021, patient developed transaminitis, AST 763, ALT 691, alkaline phosphatase 364.  Bilirubin 0.7 05/26/2021 stat ultrasound abdomen right upper quadrant showed interval development of 3.3 x 1.8 cm complex mass in the right hepatic lobe.  Increased extrahepatic ductal dilatation.  Concerning for CBD obstruction. 05/29/2021, MRI abdomen MRCP with and without contrast showed unchanged pancreatic head soft tissue.  Severe intra and extrahepatic biliary ductal dilatation.  Common bile duct stents remain in position however patency is not established.  No evidence of lymphadenopathy or metastatic disease in the abdomen.  With further communication with radiologist, addendum was added that there is internal fluid signal and no appreciable associated contrast enhancement measuring 3.4 x 2.3 cm.  This appearance is generally not consistent with  metastasis and is of uncertain significance.  Possibly reflecting hepatic abscess or residual of subcapsular hematoma.  Patient was seen by [facility] Dr. Alwyn Pea team.  He had ERCP done on 06/05/2021, with findings of One stent from the biliary tree was seen in the major papilla. The stent had migrated significantly into the duodenum. This is the cause of stent malfunction. One stent was removed from the biliary tree. Prior biliary sphincterotomy appeared open. - A single severe biliary stricture was found in the lower third of the main bile duct with upstream dilation. The stricture was alignant appearing.- The biliary tree was swept and sludge was found.- One fully covered metal stent was placed into the common bile duct across the stricture.- No pancreatogram performed.  06/12/2021-08/10/2021 gemcitabine and Abraxane.   10/14/2021, patient underwent Whipple procedure. Liver biopsy negative for malignancy. Review procedure showed invasive adenocarcinoma, moderately differentiated, centered in pancreatic head and confined in the Pancreas. Surgical margin is negative for malignancy.  36 lymph nodes were all negative for malignancy.  1 hepatic artery lymph node was harvested and was negative.  Gallbladder negative for malignancy.  Cystic duct excision negative for malignancy. pT1b pN0  10/25/2021, CT abdomen pelvis with contrast showed rim-enhancing fluid collection in the region of the hepatic hilum. Patient had a readmission for superficial wound infection.  She completed antibiotics on 11/03/2021.  11/03/2021, CT abdomen pelvis with contrast showed near resolution of previously seen fluid collection in the region of the hepatic hilum.  Similar appearance of the area of hypoenhancement in the liver.  Attention on follow-up.  11/17/2021, resumed on gemcitabine/Abraxane.  INTERVAL HISTORY Cassandra [name] is a 66 y.o. female who has above history reviewed by me today presents for pancreatic cancer.   Patient  tolerated chemotherapy well.  Denies any fever, chills, nausea vomiting. She takes pancreatic enzymes.  Intermittent diarrhea after chemo, manageable symptoms. + Lip skin lesion biopsy dermatology.  Pathology pending.   Review of Systems  Constitutional:  Negative for appetite change, chills, fatigue, fever and unexpected weight change.  HENT:   Negative for hearing loss and voice change.   Eyes:  Negative for eye problems.  Respiratory:  Negative for chest tightness and cough.   Cardiovascular:  Negative for chest pain.  Gastrointestinal:  Negative for abdominal distention, abdominal pain, blood in stool and nausea.  Endocrine: Negative for hot flashes.  Genitourinary:  Negative for difficulty urinating and frequency.   Musculoskeletal:  Negative for arthralgias.  Skin:  Negative for itching and rash.  Neurological:  Positive for numbness. Negative for extremity weakness.  Hematological:  Negative for adenopathy.  Psychiatric/Behavioral:  Negative for confusion.     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   Neutropenia ([place]) 04/18/2020   Osteopenia after menopause 05/2017   femoral neck T score -2.0   Personal history of chemotherapy    current for pancreatic ca   PONV (postoperative nausea and vomiting)    Pure hypercholesterolemia     SURGICAL HISTORY: Past Surgical History:  Procedure Laterality Date   COLONOSCOPY  07/2015   WNL   COLONOSCOPY  2008/2011   COLONOSCOPY WITH PROPOFOL N/A 07/10/2021   Procedure: COLONOSCOPY WITH PROPOFOL;  Surgeon: [name], MD;  Location: [facility] ENDOSCOPY;  Service: Endoscopy;  Laterality:  N/A;   IR CV LINE INJECTION  04/28/2021   OVARIAN CYST REMOVAL  1992   dermoid-Dr CAK   PORTA CATH INSERTION N/A 03/07/2020   Procedure: PORTA CATH INSERTION;  Surgeon: Algernon Huxley, MD;  Location: [place] CV LAB;  Service: Cardiovascular;  Laterality: N/A;   TEE WITHOUT CARDIOVERSION N/A 03/21/2020   Procedure: TRANSESOPHAGEAL ECHOCARDIOGRAM (TEE);  Surgeon: Dionisio David, MD;  Location: [facility] ORS;  Service: Cardiovascular;  Laterality: N/A;   TUBAL LIGATION  1993    SOCIAL HISTORY: Social History   Socioeconomic History   Marital status: Married    Spouse name: Not on file   Number of children: 2   Years of education: Not on file   Highest education level: Not on file  Occupational History   Occupation: Pharmacist, hospital    Comment: retired   Occupation: Psychologist, sport and exercise  Tobacco Use   Smoking status: Former    Packs/day: 1.00    Years: 12.00    Total pack years: 12.00    Types: Cigarettes    Quit date: 1987    Years since quitting: 36.5   Smokeless tobacco: Never   Tobacco comments:    Quit smoking 1987  Vaping Use   Vaping Use: Never used  Substance and Sexual Activity   Alcohol use: Not Currently    Alcohol/week: 7.0 standard drinks of alcohol    Types: 7 Glasses of wine per week    Comment: 0-2 mixed drinks a day   Drug use: No   Sexual activity: Not Currently    Birth control/protection: Post-menopausal  Other Topics Concern   Not on file  Social History Narrative   Not on file   Social Determinants of Health   Financial Resource Strain: Low Risk  (10/08/2021)   Overall Financial Resource Strain (CARDIA)    Difficulty of Paying Living Expenses: Not hard at all  Food Insecurity: No Food Insecurity (10/08/2021)   Hunger Vital Sign    Worried About Running Out of Food in the Last Year: Never true    Ran Out of Food in the Last Year: Never true  Transportation Needs: No Transportation Needs (10/08/2021)   PRAPARE - Hydrologist (Medical): No    Lack of Transportation (Non-Medical): No  Physical Activity: Sufficiently Active (10/08/2021)   Exercise Vital Sign    Days of Exercise per Week: 5 days    Minutes of Exercise  per Session: 40 min  Stress: No Stress Concern Present (10/08/2021)   [place]    Feeling of Stress : Not at all  Social Connections: Moderately Isolated (10/08/2021)   Social Connection and Isolation Panel [NHANES]    Frequency of Communication with Friends and Family: More than three times a week    Frequency of Social Gatherings with Friends and Family: Three times a week    Attends Religious Services: Never    Active Member of Clubs or Organizations: No    Attends Archivist Meetings: Never    Marital Status: Married  Human resources officer Violence: Not At Risk (10/08/2021)   Humiliation, Afraid, Rape, and Kick questionnaire    Fear of Current or Ex-Partner: No    Emotionally Abused: No    Physically Abused: No    Sexually Abused: No    FAMILY HISTORY: Family History  Problem Relation Age of Onset   Breast cancer Paternal Aunt 40   Diabetes Mother    Osteoporosis  Mother    Hyperlipidemia Father    Rheumatic fever Father    Valvular heart disease Father    Cancer Paternal Grandmother        possible colon   Breast cancer Sister 68    ALLERGIES:  is allergic to penicillin g.  MEDICATIONS:  Current Outpatient Medications  Medication Sig Dispense Refill   Calcium Carbonate-Vit D-Min (CALCIUM 1200 PO) Take by mouth.     Cholecalciferol 25 MCG (1000 UT) tablet Take 2,000 Units by mouth daily.     CREON 24000-76000 units CPEP TAKE 1 CAPSULE (24,000 UNITS TOTAL) BY MOUTH 3 (THREE) TIMES DAILY BEFORE MEALS. 200 capsule 3   Cyanocobalamin (B-12 PO) Take by mouth.     lidocaine-prilocaine (EMLA) cream APPLY TO AFFECTED AREA ONCE AS DIRECTED     Multiple Vitamins-Minerals (MULTIVITAMIN WITH MINERALS) tablet Take 1 tablet by mouth daily.     ondansetron (ZOFRAN-ODT) 4 MG disintegrating tablet Take 4 mg by mouth every 8 (eight) hours as needed.     prochlorperazine (COMPAZINE) 10 MG tablet TAKE 1 TABLET (10 MG  TOTAL) BY MOUTH EVERY 6 (SIX) HOURS AS NEEDED (NAUSEA OR VOMITING).     simethicone (GAS-X) 80 MG chewable tablet Chew 1 tablet (80 mg total) by mouth every 8 (eight) hours as needed for flatulence. 60 tablet 0   clobetasol cream (TEMOVATE) 0.05 % once daily as needed (Patient not taking: Reported on 11/09/2021)     No current facility-administered medications for this visit.   Facility-Administered Medications Ordered in Other Visits  Medication Dose Route Frequency Provider Last Rate Last Admin   gemcitabine (GEMZAR) 1,748 mg in sodium chloride 0.9 % 250 mL chemo infusion  1,000 mg/m2 (Order-Specific) Intravenous Once [name], MD       heparin lock flush 100 UNIT/ML injection            heparin lock flush 100 UNIT/ML injection            heparin lock flush 100 unit/mL  500 Units Intracatheter Once PRN [name], MD       prochlorperazine (COMPAZINE) 10 MG tablet            sodium chloride flush (NS) 0.9 % injection 10 mL  10 mL Intravenous PRN [name], MD   10 mL at 02/18/21 0858   sodium chloride flush (NS) 0.9 % injection 10 mL  10 mL Intravenous Once Borders, Vonna Kotyk R, NP       sodium chloride flush (NS) 0.9 % injection 10 mL  10 mL Intravenous Once [name], MD         PHYSICAL EXAMINATION: ECOG PERFORMANCE STATUS: 0 - Asymptomatic Vitals:   01/19/22 0834  BP: 117/85  Pulse: (!) 52  Resp: 18  Temp: 98.4 F (36.9 C)   Filed Weights   01/19/22 0834  Weight: 137 lb 9.6 oz (62.4 kg)    Physical Exam Constitutional:      General: She is not in acute distress. HENT:     Head: Normocephalic and atraumatic.  Eyes:     General: No scleral icterus. Cardiovascular:     Rate and Rhythm: Normal rate and regular rhythm.     Heart sounds: Normal heart sounds.  Pulmonary:     Effort: Pulmonary effort is normal. No respiratory distress.     Breath sounds: No wheezing.  Abdominal:     General: Bowel sounds are normal. There is no distension.     Palpations: Abdomen is soft.  Musculoskeletal:        General: No deformity. Normal range of motion.     Cervical back: Normal range of motion and neck supple.  Skin:    General: Skin is warm and dry.     Coloration: Skin is not jaundiced.     Findings: No erythema or rash.  Neurological:     Mental Status: She is alert and oriented to person, place, and time. Mental status is at baseline.     Cranial Nerves: No cranial nerve deficit.     Coordination: Coordination normal.  Psychiatric:        Mood and Affect: Mood normal.     LABORATORY DATA:  I have reviewed the data as listed    Latest Ref Rng & Units 01/19/2022    8:18 AM 01/05/2022    8:18 AM 12/21/2021    8:32 AM  CBC  WBC 4.0 - 10.5 K/uL 4.9  5.1  4.9   Hemoglobin 12.0 - 15.0 g/dL 11.1  10.5  11.6   Hematocrit 36.0 - 46.0 % 33.4  31.3  34.0   Platelets 150 - 400 K/uL 157  348  188       Latest Ref Rng & Units 01/19/2022    8:18 AM 01/05/2022    8:18 AM 12/21/2021    8:32 AM  CMP  Glucose 70 - 99 mg/dL 104  98  99   BUN 8 - 23 mg/dL $Remove'15  14  16   'wvDJIjS$ Creatinine 0.44 - 1.00 mg/dL 0.56  0.74  0.66   Sodium 135 - 145 mmol/L 136  139  141   Potassium 3.5 - 5.1 mmol/L 4.1  3.9  3.6   Chloride 98 - 111 mmol/L 106  108  109   CO2 22 - 32 mmol/L $RemoveB'25  24  26   'bpfwGjqA$ Calcium 8.9 - 10.3 mg/dL 8.3  8.5  8.6   Total Protein 6.5 - 8.1 g/dL 6.7  6.2  6.5   Total Bilirubin 0.3 - 1.2 mg/dL 0.4  0.6  0.5   Alkaline Phos 38 - 126 U/L 80  82  91   AST 15 - 41 U/L $Remo'28  26  29   'ROOuo$ ALT 0 - 44 U/L $Remo'21  20  25      'nUqOX$ RADIOGRAPHIC STUDIES: I have personally reviewed the radiological images as listed and agreed with the findings in the report. Reviewed findings of MRI abdomen MRCP done at [facility]. MM 3D SCREEN BREAST BILATERAL  Result Date: 01/13/2022 CLINICAL DATA:  Screening. EXAM: DIGITAL SCREENING BILATERAL MAMMOGRAM WITH TOMOSYNTHESIS AND CAD TECHNIQUE: Bilateral screening digital craniocaudal and mediolateral oblique mammograms were obtained. Bilateral screening digital breast  tomosynthesis was performed. The images were evaluated with computer-aided detection. COMPARISON:  Previous exam(s). ACR Breast Density Category c: The breast tissue is heterogeneously dense, which may obscure small masses. FINDINGS: There are no findings suspicious for malignancy. IMPRESSION: No mammographic evidence of malignancy. A result letter of this screening mammogram will be mailed directly to the patient. RECOMMENDATION: Screening mammogram in one year. (Code:SM-B-01Y) BI-RADS CATEGORY  1: Negative. Electronically Signed   By: Everlean Alstrom M.D.   On: 01/13/2022 16:10

## 2022-01-19 NOTE — Assessment & Plan Note (Signed)
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 Continue calcium supplementation, 2000 mg daily. IV calcium gluconate 2 g x 1 today.

## 2022-01-19 NOTE — Assessment & Plan Note (Signed)
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  Pathology proved liver metastatic disease- additional chemotherapy --> whipple procedure.--> adjuvant Gem/Abraxane ypT1b ypN0 Labs reviewed and discussed with patient Proceed with cycle 6 day 15 gemcitabine/Abraxane.-D2 Zarxio.[Treatment delayed due to patient's schedule] [2 weeks on 1 week off gemcitabine/Abraxane with G-CSF support.]

## 2022-01-20 ENCOUNTER — Inpatient Hospital Stay: Payer: Medicare Other

## 2022-01-20 DIAGNOSIS — Z5111 Encounter for antineoplastic chemotherapy: Secondary | ICD-10-CM | POA: Diagnosis not present

## 2022-01-20 DIAGNOSIS — D709 Neutropenia, unspecified: Secondary | ICD-10-CM

## 2022-01-20 LAB — CANCER ANTIGEN 19-9: CA 19-9: 5 U/mL (ref 0–35)

## 2022-01-20 MED ORDER — FILGRASTIM-SNDZ 480 MCG/0.8ML IJ SOSY
480.0000 ug | PREFILLED_SYRINGE | Freq: Once | INTRAMUSCULAR | Status: AC
  Administered 2022-01-20: 480 ug via SUBCUTANEOUS
  Filled 2022-01-20: qty 0.8

## 2022-02-02 ENCOUNTER — Inpatient Hospital Stay (HOSPITAL_BASED_OUTPATIENT_CLINIC_OR_DEPARTMENT_OTHER): Payer: Medicare Other | Admitting: Oncology

## 2022-02-02 ENCOUNTER — Inpatient Hospital Stay: Payer: Medicare Other

## 2022-02-02 ENCOUNTER — Encounter: Payer: Self-pay | Admitting: Oncology

## 2022-02-02 ENCOUNTER — Inpatient Hospital Stay: Payer: Medicare Other | Attending: Oncology

## 2022-02-02 DIAGNOSIS — C259 Malignant neoplasm of pancreas, unspecified: Secondary | ICD-10-CM

## 2022-02-02 DIAGNOSIS — C787 Secondary malignant neoplasm of liver and intrahepatic bile duct: Secondary | ICD-10-CM | POA: Insufficient documentation

## 2022-02-02 DIAGNOSIS — Z87891 Personal history of nicotine dependence: Secondary | ICD-10-CM | POA: Diagnosis not present

## 2022-02-02 DIAGNOSIS — K8689 Other specified diseases of pancreas: Secondary | ICD-10-CM

## 2022-02-02 DIAGNOSIS — C250 Malignant neoplasm of head of pancreas: Secondary | ICD-10-CM | POA: Diagnosis present

## 2022-02-02 DIAGNOSIS — T451X5A Adverse effect of antineoplastic and immunosuppressive drugs, initial encounter: Secondary | ICD-10-CM

## 2022-02-02 DIAGNOSIS — G620 Drug-induced polyneuropathy: Secondary | ICD-10-CM

## 2022-02-02 DIAGNOSIS — Z5111 Encounter for antineoplastic chemotherapy: Secondary | ICD-10-CM | POA: Insufficient documentation

## 2022-02-02 DIAGNOSIS — Z5189 Encounter for other specified aftercare: Secondary | ICD-10-CM | POA: Diagnosis not present

## 2022-02-02 DIAGNOSIS — D6481 Anemia due to antineoplastic chemotherapy: Secondary | ICD-10-CM

## 2022-02-02 LAB — CBC WITH DIFFERENTIAL/PLATELET
Abs Immature Granulocytes: 0.01 10*3/uL (ref 0.00–0.07)
Basophils Absolute: 0 10*3/uL (ref 0.0–0.1)
Basophils Relative: 1 %
Eosinophils Absolute: 0.2 10*3/uL (ref 0.0–0.5)
Eosinophils Relative: 5 %
HCT: 35.4 % — ABNORMAL LOW (ref 36.0–46.0)
Hemoglobin: 11.9 g/dL — ABNORMAL LOW (ref 12.0–15.0)
Immature Granulocytes: 0 %
Lymphocytes Relative: 27 %
Lymphs Abs: 1.1 10*3/uL (ref 0.7–4.0)
MCH: 34.4 pg — ABNORMAL HIGH (ref 26.0–34.0)
MCHC: 33.6 g/dL (ref 30.0–36.0)
MCV: 102.3 fL — ABNORMAL HIGH (ref 80.0–100.0)
Monocytes Absolute: 0.4 10*3/uL (ref 0.1–1.0)
Monocytes Relative: 10 %
Neutro Abs: 2.4 10*3/uL (ref 1.7–7.7)
Neutrophils Relative %: 57 %
Platelets: 170 10*3/uL (ref 150–400)
RBC: 3.46 MIL/uL — ABNORMAL LOW (ref 3.87–5.11)
RDW: 15.8 % — ABNORMAL HIGH (ref 11.5–15.5)
WBC: 4.1 10*3/uL (ref 4.0–10.5)
nRBC: 0 % (ref 0.0–0.2)

## 2022-02-02 LAB — COMPREHENSIVE METABOLIC PANEL
ALT: 23 U/L (ref 0–44)
AST: 31 U/L (ref 15–41)
Albumin: 3.9 g/dL (ref 3.5–5.0)
Alkaline Phosphatase: 89 U/L (ref 38–126)
Anion gap: 8 (ref 5–15)
BUN: 13 mg/dL (ref 8–23)
CO2: 25 mmol/L (ref 22–32)
Calcium: 9 mg/dL (ref 8.9–10.3)
Chloride: 108 mmol/L (ref 98–111)
Creatinine, Ser: 0.72 mg/dL (ref 0.44–1.00)
GFR, Estimated: >60 mL/min (ref >60)
Glucose, Bld: 126 mg/dL — ABNORMAL HIGH (ref 70–99)
Potassium: 4.4 mmol/L (ref 3.5–5.1)
Sodium: 141 mmol/L (ref 135–145)
Total Bilirubin: 0.2 mg/dL — ABNORMAL LOW (ref 0.3–1.2)
Total Protein: 6.6 g/dL (ref 6.5–8.1)

## 2022-02-02 MED ORDER — LIDOCAINE-PRILOCAINE 2.5-2.5 % EX CREA: TOPICAL_CREAM | CUTANEOUS | 6 refills | Status: DC

## 2022-02-02 MED ORDER — SODIUM CHLORIDE 0.9 % IV SOLN
1000.0000 mg/m2 | Freq: Once | INTRAVENOUS | Status: AC
  Administered 2022-02-02: 1748 mg via INTRAVENOUS
  Filled 2022-02-02: qty 26.3

## 2022-02-02 MED ORDER — HEPARIN SOD (PORK) LOCK FLUSH 100 UNIT/ML IV SOLN
500.0000 [IU] | Freq: Once | INTRAVENOUS | Status: AC | PRN
  Filled 2022-02-02: qty 5

## 2022-02-02 MED ORDER — SODIUM CHLORIDE 0.9 % IV SOLN
Freq: Once | INTRAVENOUS | Status: AC
  Filled 2022-02-02: qty 250

## 2022-02-02 MED ORDER — PROCHLORPERAZINE MALEATE 10 MG PO TABS
10.0000 mg | ORAL_TABLET | Freq: Once | ORAL | Status: AC
  Administered 2022-02-02: 10 mg via ORAL
  Filled 2022-02-02: qty 1

## 2022-02-02 MED ORDER — PACLITAXEL PROTEIN-BOUND CHEMO INJECTION 100 MG
100.0000 mg/m2 | Freq: Once | INTRAVENOUS | Status: AC
  Administered 2022-02-02: 175 mg via INTRAVENOUS
  Filled 2022-02-02: qty 35

## 2022-02-02 MED ORDER — HEPARIN SOD (PORK) LOCK FLUSH 100 UNIT/ML IV SOLN
INTRAVENOUS | Status: AC
  Administered 2022-02-02: 500 [IU]
  Filled 2022-02-02: qty 5

## 2022-02-02 NOTE — Assessment & Plan Note (Addendum)
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  Pathology proved liver metastatic disease- additional chemotherapy --> whipple procedure.--> adjuvant Gem/Abraxane ypT1b ypN0 Labs reviewed and discussed with patient Proceed with cycle 7 day 1 gemcitabine/Abraxane.-D2 Zarxio.  She will proceed with D8 treatment next week if labs meet parameters.  [2 weeks on 1 week off gemcitabine/Abraxane with G-CSF support.] She has appt and imaging at [facility] in September.  Follow up with me in 2 months.

## 2022-02-02 NOTE — Patient Instructions (Signed)
[facility] AT [place]  Discharge Instructions: Thank you for choosing [facility] to provide your oncology and hematology care.  If you have a lab appointment with the [facility], please go directly to the [facility] and check in at the registration area.  Wear comfortable clothing and clothing appropriate for easy access to any Portacath or PICC line.   We strive to give you quality time with your provider. You may need to reschedule your appointment if you arrive late (15 or more minutes).  Arriving late affects you and other patients whose appointments are after yours.  Also, if you miss three or more appointments without notifying the office, you may be dismissed from the clinic at the provider's discretion.      For prescription refill requests, have your pharmacy contact our office and allow 72 hours for refills to be completed.    Today you received the following chemotherapy and/or immunotherapy agents Abraxane and Gemzar       To help prevent nausea and vomiting after your treatment, we encourage you to take your nausea medication as directed.  BELOW ARE SYMPTOMS THAT SHOULD BE REPORTED IMMEDIATELY: *FEVER GREATER THAN 100.4 F (38 C) OR HIGHER *CHILLS OR SWEATING *NAUSEA AND VOMITING THAT IS NOT CONTROLLED WITH YOUR NAUSEA MEDICATION *UNUSUAL SHORTNESS OF BREATH *UNUSUAL BRUISING OR BLEEDING *URINARY PROBLEMS (pain or burning when urinating, or frequent urination) *BOWEL PROBLEMS (unusual diarrhea, constipation, pain near the anus) TENDERNESS IN MOUTH AND THROAT WITH OR WITHOUT PRESENCE OF ULCERS (sore throat, sores in mouth, or a toothache) UNUSUAL RASH, SWELLING OR PAIN  UNUSUAL VAGINAL DISCHARGE OR ITCHING   Items with * indicate a potential emergency and should be followed up as soon as possible or go to the Emergency Department if any problems should occur.  Please show the CHEMOTHERAPY ALERT CARD or IMMUNOTHERAPY ALERT CARD at  check-in to the Emergency Department and triage nurse.  Should you have questions after your visit or need to cancel or reschedule your appointment, please contact [facility] [place] [facility] [place]  [phone] and follow the prompts.  Office hours are 8:00 a.m. to 4:30 p.m. Monday - Friday. Please note that voicemails left after 4:00 p.m. may not be returned until the following business day.  We are closed weekends and major holidays. You have access to a nurse at all times for urgent questions. Please call the main number to the clinic [phone] and follow the prompts.  For any non-urgent questions, you may also contact your provider using MyChart. We now offer e-Visits for anyone 17 and older to request care online for non-urgent symptoms. For details visit [email or web address].   Also download the MyChart app! Go to the app store, search "MyChart", open the app, select [facility], and log in with your MyChart username and password.  Masks are optional in the cancer centers. If you would like for your care team to wear a mask while they are taking care of you, please let them know. For doctor visits, patients may have with them one support person who is at least 66 years old. At this time, visitors are not allowed in the infusion area.

## 2022-02-02 NOTE — Assessment & Plan Note (Signed)
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 Continue calcium supplementation, 2000 mg daily.

## 2022-02-02 NOTE — Assessment & Plan Note (Signed)
Chemotherapy plan as listed above 

## 2022-02-02 NOTE — Assessment & Plan Note (Signed)
Hemoglobin is stable.  Monitor. 

## 2022-02-02 NOTE — Assessment & Plan Note (Signed)
status post Whipple procedure..  Continue Creon, continue 24000 units TID with meals and  with her snacks 

## 2022-02-02 NOTE — Assessment & Plan Note (Signed)
fingertips and toes, grade 2.  She did not tolerate Cymbalta.   Stable symptoms. Acupuncture sessions as needed.Marland Kitchen

## 2022-02-02 NOTE — Progress Notes (Signed)
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  Pathology proved liver metastatic disease- additional chemotherapy --> whipple procedure.--> adjuvant Gem/Abraxane ypT1b ypN0 Labs reviewed and discussed with patient Proceed with cycle 7 day 1 gemcitabine/Abraxane.-D2 Zarxio.  She will proceed with D8 treatment next week if labs meet parameters.  [2 weeks on 1 week off gemcitabine/Abraxane with G-CSF support.] She has appt and imaging at [facility] in September.  Follow up with me in 2 months.   Chemotherapy-induced neuropathy (HCC) fingertips and toes, grade 2.  She did not tolerate Cymbalta.   Stable symptoms. Acupuncture sessions as needed..   Hypocalcemia Likely due to malabsorption. Continue calcium supplementation, 2000 mg daily.   Pancreatic insufficiency  status post Whipple procedure..  Continue Creon, continue 24000 units TID with meals and  with her snacks  Encounter for antineoplastic chemotherapy Chemotherapy plan as listed above  Anemia due to antineoplastic chemotherapy Hemoglobin is stable. Monitor.   No orders of the defined types were placed in this encounter.  Follow up 1 week lab MD Gem+ Abraxane, D2 Zarxio 2 months lab MD   All questions were answered. The patient knows to call the clinic with any problems, questions or concerns.  [name], MD, PhD [facility] Hematology Oncology 02/02/2022   [name], [name], MD  CHIEF COMPLAINTS/REASON FOR VISIT:  Follow up for treatment of  pancreatic adenocarcinoma  HISTORY OF PRESENTING ILLNESS:   Cassandra [name] is a  66 y.o.  female with PMH listed below was seen in consultation at the request of  [name], MD  for evaluation of pancreatic adenocarcinoma Patient initially presented with jaundice, transaminitis, bilirubin was 9.9.  CA 19-9 was 1874.  Patient also reports unintentional weight loss. 02/08/2020 MRI abdomen and MRCP with and without contrast was done at [facility] which showed pancreatic head mass measuring up to 3 cm, with marked associated narrowing of the portal vein confluence.  SMA is preserved.  Marked intrahepatic and extrahepatic biliary duct dilatation as well as mild dilatation of the main pancreatic duct. Multiple hepatic masses highly concerning for metastatic disease.  Patient underwent EUS on 02/19/2020, which showed irregular mass identified in the pancreatic head, hypoechoic, measured 8mmx33mm, sonographic evidence concerning for invasion into the superior mesenteric artery.  There is no sign of significant abnormality in the main pancreatic duct.  Dilatation of common bile duct which measured up to 16 mm.  Region of celiac artery was visualized and showed no signs of significant abnormality.  No lymphadenopathy.  FNA showed adenocarcinoma.  02/19/2020, ERCP, malignant.  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  The stricture was treated with placement of wall flex metal stent.  Patient was seen by [facility] oncology Dr. Pia Mau and was recommended for 3 drug regimen FOLFIRINOX.  Patient prefers to do chemotherapy locally at [facility].  Patient was referred to establish care today. She denies any pain.  Since stent placement, skin jaundice has improved.  Itchiness has also improved. Patient was accompanied by her husband today.  She has a family history of breast cancer  in sister and paternal aunt, colon cancer paternal grandmother.  #No reportable  targetable mutation on NGS 9/14/2021cycle 1 FOLFIRINOX.  Patient received oxaliplatin and about 50% of Irinotecan on day 1 and had experienced neurologic symptoms.  She went to ER and working diagnosis is TIA, and eventually I think this is due to irinotecan side effects -irinotecan-associated dysarthria, lip/tongue numbness.  Adjustment was made for Irinotecan to be  infused over 180 minutes.  Atropine 0.5 mg once prior to the irinotecan. No recurrent symptoms.   # 03/19/2020-03/21/2020 patient was admitted due to sepsis with strep pneumonia bacteremia.  Patient was treated with IV Rocephin.  TEE was done which showed no vegetation.  No PFO or ASD.  Patient was discharged home and he finished full course of 14 days of IV Rocephin on 04/02/2020 per ID recommendation.  Repeat blood culture was also negative.  08/25/20 cycle 10 FOLFIRINOX 09/08/20- present  Starting cycle 11, FOLFIRI, Oxaliplatin discontinued due to neuropathy   #NGS showed no reportable targetable mutation #Genetic testing-[employer] diagnostic testing showed no pathological variants identified. MS stable, TMB 80mut/mb, KRAS G12D, SF3B1 K700E, TP53 V275fs*30  #01/14/2021  CT done at [facility] during the interval, stable disease.  # Her case was presented by Dr.Jia from [facility] tumor board. [place] Scrape were 4~5 liver lesions on OSH MRI last year at the time of diagnosis highly suspicious for metastasis. She is not eligible for surgical protocol for metastasis resection given the number of liver metastasis is >3. However given her excellent and durable response to chemo, surgical resection may be considered pending sustained disease control at 1 year and may require partial hepatectomy first to see if there is viable tumor before proceeding to Whipple resection ]  Patient had a COVID-19 infection in September 2022.  03/23/2021 CT abdomen pelvis No significant change in the ill-defined pancreatic head mass or  associated biliary ductal and pancreatic ductal  dilation.  Slight interval enlargement of subcentimeter anterior peripancreatic  lymph node, nonspecific. Attention on follow-up per clinical protocol. Similar enlargement of the ascending thoracic aorta measuring 4.4 cm  03/23/2021 MRI abdomen w and wo  Increasing ill-defined hypoenhancement of the pancreatic head surrounding the common bile duct stent, in keeping with known pancreatic malignancy. Anterior peripancreatic lymph node is better evaluated on CT.  Unchanged position of a common bile duct stent with similar mild diffuse intrahepatic ductal dilation and pneumobilia. No evidence of metastatic disease in the abdomen or pelvis. Partially visualized cystic left adnexal lesion measuring up to 3.9 cm.   04/10/2021, patient underwent liver resection at [facility], by Dr. [name].   Pathology report from [place] was reviewed. Segments 3 partial hepatectomy, negative for viable tumor. Section 5/6 partial hepatectomy part 1 and 2, microscopic foci of residual viable adenocarcinoma, morphologically consistent with history of pancreatic primary.  Parenchymal margin is uninvolved.  #Patient resumed on FOLFIRI on 04/28/2021. She got another cycle on 05/12/2021 #05/26/2021, patient did not get additional chemotherapy due to transaminitis.  Shared decision was made to stop FOLFIRI and switch to gemcitabine Abraxane treatments.  05/26/2021, patient developed transaminitis, AST 763, ALT 691, alkaline phosphatase 364.  Bilirubin 0.7 05/26/2021 stat ultrasound abdomen right upper quadrant showed interval development of 3.3 x 1.8 cm complex mass in the right hepatic lobe.  Increased extrahepatic ductal dilatation.  Concerning for CBD obstruction. 05/29/2021, MRI abdomen MRCP with and without contrast showed unchanged pancreatic head soft tissue.  Severe intra and extrahepatic biliary ductal dilatation.  Common bile duct stents remain in position however patency  is not established.  No evidence of lymphadenopathy or  metastatic disease in the abdomen.  With further communication with radiologist, addendum was added that there is internal fluid signal and no appreciable associated contrast enhancement measuring 3.4 x 2.3 cm.  This appearance is generally not consistent with metastasis and is of uncertain significance.  Possibly reflecting hepatic abscess or residual of subcapsular hematoma.  Patient was seen by [facility] Dr. Elon Jester team.  He had ERCP done on 06/05/2021, with findings of One stent from the biliary tree was seen in the major papilla. The stent had migrated significantly into the duodenum. This is the cause of stent malfunction. One stent was removed from the biliary tree. Prior biliary sphincterotomy appeared open. - A single severe biliary stricture was found in the lower third of the main bile duct with upstream dilation. The stricture was alignant appearing.- The biliary tree was swept and sludge was found.- One fully covered metal stent was placed into the common bile duct across the stricture.- No pancreatogram performed.  06/12/2021-08/10/2021 gemcitabine and Abraxane.   10/14/2021, patient underwent Whipple procedure. Liver biopsy negative for malignancy. Review procedure showed invasive adenocarcinoma, moderately differentiated, centered in pancreatic head and confined in the Pancreas. Surgical margin is negative for malignancy.  36 lymph nodes were all negative for malignancy.  1 hepatic artery lymph node was harvested and was negative.  Gallbladder negative for malignancy.  Cystic duct excision negative for malignancy. pT1b pN0  10/25/2021, CT abdomen pelvis with contrast showed rim-enhancing fluid collection in the region of the hepatic hilum. Patient had a readmission for superficial wound infection.  She completed antibiotics on 11/03/2021.  11/03/2021, CT abdomen pelvis with contrast showed near resolution of previously seen fluid collection in the region of the hepatic hilum.  Similar appearance of  the area of hypoenhancement in the liver.  Attention on follow-up.  11/17/2021, resumed on gemcitabine/Abraxane.  INTERVAL HISTORY CATERRA OSTROFF is a 66 y.o. female who has above history reviewed by me today presents for pancreatic cancer.   Patient tolerated chemotherapy well.  Denies any fever, chills, nausea vomiting. No new complaints.  She takes pancreatic enzymes.  Intermittent diarrhea after chemo, manageable symptoms. + Lip skin lesion biopsy dermatology.  Per patient she has  basal cell carcinoma of the skin, there is plan of mohs surgery in Oct 2023.    Review of Systems  Constitutional:  Negative for appetite change, chills, fatigue, fever and unexpected weight change.  HENT:   Negative for hearing loss and voice change.   Eyes:  Negative for eye problems.  Respiratory:  Negative for chest tightness and cough.   Cardiovascular:  Negative for chest pain.  Gastrointestinal:  Negative for abdominal distention, abdominal pain, blood in stool and nausea.  Endocrine: Negative for hot flashes.  Genitourinary:  Negative for difficulty urinating and frequency.   Musculoskeletal:  Negative for arthralgias.  Skin:  Negative for itching and rash.  Neurological:  Positive for numbness. Negative for extremity weakness.  Hematological:  Negative for adenopathy.  Psychiatric/Behavioral:  Negative for confusion.     MEDICAL HISTORY:  Past Medical History:  Diagnosis Date   Anemia    Cancer (HCC)    pancreatic cancer   Colon polyps    Family history of breast cancer    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   Neutropenia (HCC) 04/18/2020   Osteopenia after menopause 05/2017   femoral neck T score -2.0   Personal history of chemotherapy  current for pancreatic ca   PONV (postoperative nausea and vomiting)    Pure hypercholesterolemia     SURGICAL HISTORY: Past Surgical History:  Procedure Laterality Date   COLONOSCOPY  07/2015   WNL   COLONOSCOPY  2008/2011    COLONOSCOPY WITH PROPOFOL N/A 07/10/2021   Procedure: COLONOSCOPY WITH PROPOFOL;  Surgeon: Regis Bill, MD;  Location: [facility] ENDOSCOPY;  Service: Endoscopy;  Laterality: N/A;   IR CV LINE INJECTION  04/28/2021   OVARIAN CYST REMOVAL  1992   dermoid-Dr CAK   PORTA CATH INSERTION N/A 03/07/2020   Procedure: PORTA CATH INSERTION;  Surgeon: Annice Needy, MD;  Location: [facility] INVASIVE CV LAB;  Service: Cardiovascular;  Laterality: N/A;   TEE WITHOUT CARDIOVERSION N/A 03/21/2020   Procedure: TRANSESOPHAGEAL ECHOCARDIOGRAM (TEE);  Surgeon: Laurier Nancy, MD;  Location: [facility] ORS;  Service: Cardiovascular;  Laterality: N/A;   TUBAL LIGATION  1993    SOCIAL HISTORY: Social History   Socioeconomic History   Marital status: Married    Spouse name: Not on file   Number of children: 2   Years of education: Not on file   Highest education level: Not on file  Occupational History   Occupation: Runner, broadcasting/film/video    Comment: retired   Occupation: Visual merchandiser  Tobacco Use   Smoking status: Former    Packs/day: 1.00    Years: 12.00    Total pack years: 12.00    Types: Cigarettes    Quit date: 1987    Years since quitting: 36.6   Smokeless tobacco: Never   Tobacco comments:    Quit smoking 1987  Vaping Use   Vaping Use: Never used  Substance and Sexual Activity   Alcohol use: Not Currently    Alcohol/week: 7.0 standard drinks of alcohol    Types: 7 Glasses of wine per week    Comment: 0-2 mixed drinks a day   Drug use: No   Sexual activity: Not Currently    Birth control/protection: Post-menopausal  Other Topics Concern   Not on file  Social History Narrative   Not on file   Social Determinants of Health   Financial Resource Strain: Low Risk  (10/08/2021)   Overall Financial Resource Strain (CARDIA)    Difficulty of Paying Living Expenses: Not hard at all  Food Insecurity: No Food Insecurity (10/08/2021)   Hunger Vital Sign    Worried About Running Out of Food in the Last Year: Never true     Ran Out of Food in the Last Year: Never true  Transportation Needs: No Transportation Needs (10/08/2021)   PRAPARE - Administrator, Civil Service (Medical): No    Lack of Transportation (Non-Medical): No  Physical Activity: Sufficiently Active (10/08/2021)   Exercise Vital Sign    Days of Exercise per Week: 5 days    Minutes of Exercise per Session: 40 min  Stress: No Stress Concern Present (10/08/2021)   [employer] of Occupational Health - Occupational Stress Questionnaire    Feeling of Stress : Not at all  Social Connections: Moderately Isolated (10/08/2021)   Social Connection and Isolation Panel [NHANES]    Frequency of Communication with Friends and Family: More than three times a week    Frequency of Social Gatherings with Friends and Family: Three times a week    Attends Religious Services: Never    Active Member of Clubs or Organizations: No    Attends Banker Meetings: Never    Marital Status: Married  Intimate Partner Violence: Not At Risk (10/08/2021)   Humiliation, Afraid, Rape, and Kick questionnaire    Fear of Current or Ex-Partner: No    Emotionally Abused: No    Physically Abused: No    Sexually Abused: No    FAMILY HISTORY: Family History  Problem Relation Age of Onset   Breast cancer Paternal Aunt 40   Diabetes Mother    Osteoporosis Mother    Hyperlipidemia Father    Rheumatic fever Father    Valvular heart disease Father    Cancer Paternal Grandmother        possible colon   Breast cancer Sister 3    ALLERGIES:  is allergic to penicillin g.  MEDICATIONS:  Current Outpatient Medications  Medication Sig Dispense Refill   Calcium Carbonate-Vit D-Min (CALCIUM 1200 PO) Take by mouth.     Cholecalciferol 25 MCG (1000 UT) tablet Take 2,000 Units by mouth daily.     CREON 24000-76000 units CPEP TAKE 1 CAPSULE (24,000 UNITS TOTAL) BY MOUTH 3 (THREE) TIMES DAILY BEFORE MEALS. 200 capsule 3   Cyanocobalamin (B-12 PO) Take by  mouth.     Multiple Vitamins-Minerals (MULTIVITAMIN WITH MINERALS) tablet Take 1 tablet by mouth daily.     ondansetron (ZOFRAN-ODT) 4 MG disintegrating tablet Take 4 mg by mouth every 8 (eight) hours as needed.     prochlorperazine (COMPAZINE) 10 MG tablet TAKE 1 TABLET (10 MG TOTAL) BY MOUTH EVERY 6 (SIX) HOURS AS NEEDED (NAUSEA OR VOMITING).     simethicone (GAS-X) 80 MG chewable tablet Chew 1 tablet (80 mg total) by mouth every 8 (eight) hours as needed for flatulence. 60 tablet 0   clobetasol cream (TEMOVATE) 0.05 % once daily as needed (Patient not taking: Reported on 11/09/2021)     lidocaine-prilocaine (EMLA) cream APPLY TO AFFECTED AREA ONCE AS DIRECTED 30 g 6   No current facility-administered medications for this visit.   Facility-Administered Medications Ordered in Other Visits  Medication Dose Route Frequency Provider Last Rate Last Admin   gemcitabine (GEMZAR) 1,748 mg in sodium chloride 0.9 % 250 mL chemo infusion  1,000 mg/m2 (Order-Specific) Intravenous Once [name], MD       heparin lock flush 100 UNIT/ML injection            heparin lock flush 100 unit/mL  500 Units Intracatheter Once PRN [name], MD       PACLitaxel-protein bound (ABRAXANE) chemo infusion 175 mg  100 mg/m2 (Treatment Plan Recorded) Intravenous Once [name], MD       prochlorperazine (COMPAZINE) 10 MG tablet            prochlorperazine (COMPAZINE) tablet 10 mg  10 mg Oral Once [name], MD       sodium chloride flush (NS) 0.9 % injection 10 mL  10 mL Intravenous PRN [name], MD   10 mL at 02/18/21 0858   sodium chloride flush (NS) 0.9 % injection 10 mL  10 mL Intravenous Once Borders, Vonna Kotyk R, NP       sodium chloride flush (NS) 0.9 % injection 10 mL  10 mL Intravenous Once [name], MD         PHYSICAL EXAMINATION: ECOG PERFORMANCE STATUS: 0 - Asymptomatic Vitals:   02/02/22 0838  BP: 129/70  Pulse: (!) 52  Temp: 98.5 F (36.9 C)   Filed Weights   02/02/22 0838  Weight: 137 lb (62.1 kg)     Physical Exam Constitutional:      General: She  is not in acute distress. HENT:     Head: Normocephalic and atraumatic.  Eyes:     General: No scleral icterus. Cardiovascular:     Rate and Rhythm: Normal rate and regular rhythm.     Heart sounds: Normal heart sounds.  Pulmonary:     Effort: Pulmonary effort is normal. No respiratory distress.     Breath sounds: No wheezing.  Abdominal:     General: Bowel sounds are normal. There is no distension.     Palpations: Abdomen is soft.  Musculoskeletal:        General: No deformity. Normal range of motion.     Cervical back: Normal range of motion and neck supple.  Skin:    General: Skin is warm and dry.     Coloration: Skin is not jaundiced.     Findings: No erythema or rash.  Neurological:     Mental Status: She is alert and oriented to person, place, and time. Mental status is at baseline.     Cranial Nerves: No cranial nerve deficit.     Coordination: Coordination normal.  Psychiatric:        Mood and Affect: Mood normal.     LABORATORY DATA:  I have reviewed the data as listed    Latest Ref Rng & Units 02/02/2022    8:25 AM 01/19/2022    8:18 AM 01/05/2022    8:18 AM  CBC  WBC 4.0 - 10.5 K/uL 4.1  4.9  5.1   Hemoglobin 12.0 - 15.0 g/dL 11.9  11.1  10.5   Hematocrit 36.0 - 46.0 % 35.4  33.4  31.3   Platelets 150 - 400 K/uL 170  157  348       Latest Ref Rng & Units 02/02/2022    8:25 AM 01/19/2022    8:18 AM 01/05/2022    8:18 AM  CMP  Glucose 70 - 99 mg/dL 126  104  98   BUN 8 - 23 mg/dL $Remove'13  15  14   'nmmxKeJ$ Creatinine 0.44 - 1.00 mg/dL 0.72  0.56  0.74   Sodium 135 - 145 mmol/L 141  136  139   Potassium 3.5 - 5.1 mmol/L 4.4  4.1  3.9   Chloride 98 - 111 mmol/L 108  106  108   CO2 22 - 32 mmol/L $RemoveB'25  25  24   'SRruJeek$ Calcium 8.9 - 10.3 mg/dL 9.0  8.3  8.5   Total Protein 6.5 - 8.1 g/dL 6.6  6.7  6.2   Total Bilirubin 0.3 - 1.2 mg/dL 0.2  0.4  0.6   Alkaline Phos 38 - 126 U/L 89  80  82   AST 15 - 41 U/L $Remo'31  28  26   'xnCKz$ ALT 0 -  44 U/L $Remo'23  21  20      'teBQz$ RADIOGRAPHIC STUDIES: I have personally reviewed the radiological images as listed and agreed with the findings in the report. Reviewed findings of MRI abdomen MRCP done at [facility]. MM 3D SCREEN BREAST BILATERAL  Result Date: 01/13/2022 CLINICAL DATA:  Screening. EXAM: DIGITAL SCREENING BILATERAL MAMMOGRAM WITH TOMOSYNTHESIS AND CAD TECHNIQUE: Bilateral screening digital craniocaudal and mediolateral oblique mammograms were obtained. Bilateral screening digital breast tomosynthesis was performed. The images were evaluated with computer-aided detection. COMPARISON:  Previous exam(s). ACR Breast Density Category c: The breast tissue is heterogeneously dense, which may obscure small masses. FINDINGS: There are no findings suspicious for malignancy. IMPRESSION: No mammographic evidence of malignancy. A result letter of  this screening mammogram will be mailed directly to the patient. RECOMMENDATION: Screening mammogram in one year. (Code:SM-B-01Y) BI-RADS CATEGORY  1: Negative. Electronically Signed   By: Everlean Alstrom M.D.   On: 01/13/2022 16:10

## 2022-02-03 ENCOUNTER — Other Ambulatory Visit: Payer: Self-pay

## 2022-02-03 ENCOUNTER — Inpatient Hospital Stay: Payer: Medicare Other

## 2022-02-03 DIAGNOSIS — D709 Neutropenia, unspecified: Secondary | ICD-10-CM

## 2022-02-03 DIAGNOSIS — Z5111 Encounter for antineoplastic chemotherapy: Secondary | ICD-10-CM | POA: Diagnosis not present

## 2022-02-03 LAB — CANCER ANTIGEN 19-9: CA 19-9: 6 U/mL (ref 0–35)

## 2022-02-03 MED ORDER — FILGRASTIM-SNDZ 480 MCG/0.8ML IJ SOSY
480.0000 ug | PREFILLED_SYRINGE | Freq: Once | INTRAMUSCULAR | Status: AC
  Administered 2022-02-03: 480 ug via SUBCUTANEOUS
  Filled 2022-02-03: qty 0.8

## 2022-02-09 ENCOUNTER — Inpatient Hospital Stay: Payer: Medicare Other

## 2022-02-09 ENCOUNTER — Ambulatory Visit: Payer: Medicare Other

## 2022-02-09 VITALS — BP 117/68 | HR 57 | Temp 98.7°F | Resp 18

## 2022-02-09 DIAGNOSIS — Z5111 Encounter for antineoplastic chemotherapy: Secondary | ICD-10-CM | POA: Diagnosis not present

## 2022-02-09 DIAGNOSIS — T451X5A Adverse effect of antineoplastic and immunosuppressive drugs, initial encounter: Secondary | ICD-10-CM

## 2022-02-09 DIAGNOSIS — C259 Malignant neoplasm of pancreas, unspecified: Secondary | ICD-10-CM

## 2022-02-09 DIAGNOSIS — K8689 Other specified diseases of pancreas: Secondary | ICD-10-CM

## 2022-02-09 LAB — COMPREHENSIVE METABOLIC PANEL
ALT: 26 U/L (ref 0–44)
AST: 33 U/L (ref 15–41)
Albumin: 3.9 g/dL (ref 3.5–5.0)
Alkaline Phosphatase: 82 U/L (ref 38–126)
Anion gap: 6 (ref 5–15)
BUN: 16 mg/dL (ref 8–23)
CO2: 25 mmol/L (ref 22–32)
Calcium: 8.7 mg/dL — ABNORMAL LOW (ref 8.9–10.3)
Chloride: 110 mmol/L (ref 98–111)
Creatinine, Ser: 0.66 mg/dL (ref 0.44–1.00)
GFR, Estimated: >60 mL/min (ref >60)
Glucose, Bld: 100 mg/dL — ABNORMAL HIGH (ref 70–99)
Potassium: 4.4 mmol/L (ref 3.5–5.1)
Sodium: 141 mmol/L (ref 135–145)
Total Bilirubin: 0.4 mg/dL (ref 0.3–1.2)
Total Protein: 6.5 g/dL (ref 6.5–8.1)

## 2022-02-09 LAB — CBC WITH DIFFERENTIAL/PLATELET
Abs Immature Granulocytes: 0.07 10*3/uL (ref 0.00–0.07)
Basophils Absolute: 0 10*3/uL (ref 0.0–0.1)
Basophils Relative: 1 %
Eosinophils Absolute: 0 10*3/uL (ref 0.0–0.5)
Eosinophils Relative: 1 %
HCT: 34 % — ABNORMAL LOW (ref 36.0–46.0)
Hemoglobin: 11.3 g/dL — ABNORMAL LOW (ref 12.0–15.0)
Immature Granulocytes: 2 %
Lymphocytes Relative: 40 %
Lymphs Abs: 1.4 10*3/uL (ref 0.7–4.0)
MCH: 33.7 pg (ref 26.0–34.0)
MCHC: 33.2 g/dL (ref 30.0–36.0)
MCV: 101.5 fL — ABNORMAL HIGH (ref 80.0–100.0)
Monocytes Absolute: 0.2 10*3/uL (ref 0.1–1.0)
Monocytes Relative: 6 %
Neutro Abs: 1.7 10*3/uL (ref 1.7–7.7)
Neutrophils Relative %: 50 %
Platelets: 222 10*3/uL (ref 150–400)
RBC: 3.35 MIL/uL — ABNORMAL LOW (ref 3.87–5.11)
RDW: 14.1 % (ref 11.5–15.5)
WBC: 3.4 10*3/uL — ABNORMAL LOW (ref 4.0–10.5)
nRBC: 0 % (ref 0.0–0.2)

## 2022-02-09 MED ORDER — PACLITAXEL PROTEIN-BOUND CHEMO INJECTION 100 MG
100.0000 mg/m2 | Freq: Once | INTRAVENOUS | Status: AC
  Administered 2022-02-09: 175 mg via INTRAVENOUS
  Filled 2022-02-09: qty 35

## 2022-02-09 MED ORDER — SODIUM CHLORIDE 0.9 % IV SOLN
Freq: Once | INTRAVENOUS | Status: AC
  Filled 2022-02-09: qty 250

## 2022-02-09 MED ORDER — HEPARIN SOD (PORK) LOCK FLUSH 100 UNIT/ML IV SOLN
500.0000 [IU] | Freq: Once | INTRAVENOUS | Status: AC | PRN
  Administered 2022-02-09: 500 [IU]
  Filled 2022-02-09: qty 5

## 2022-02-09 MED ORDER — SODIUM CHLORIDE 0.9% FLUSH
10.0000 mL | Freq: Once | INTRAVENOUS | Status: AC
  Administered 2022-02-09: 10 mL via INTRAVENOUS
  Filled 2022-02-09: qty 10

## 2022-02-09 MED ORDER — SODIUM CHLORIDE 0.9 % IV SOLN
1000.0000 mg/m2 | Freq: Once | INTRAVENOUS | Status: AC
  Administered 2022-02-09: 1748 mg via INTRAVENOUS
  Filled 2022-02-09: qty 26.3

## 2022-02-09 MED ORDER — PROCHLORPERAZINE MALEATE 10 MG PO TABS
10.0000 mg | ORAL_TABLET | Freq: Once | ORAL | Status: AC
  Administered 2022-02-09: 10 mg via ORAL

## 2022-02-09 NOTE — Patient Instructions (Signed)
[facility] CANCER CTR AT [place]  Discharge Instructions: Thank you for choosing [facility] to provide your oncology and hematology care.  If you have a lab appointment with the [facility], please go directly to the [facility] and check in at the registration area.  Wear comfortable clothing and clothing appropriate for easy access to any Portacath or PICC line.   We strive to give you quality time with your provider. You may need to reschedule your appointment if you arrive late (15 or more minutes).  Arriving late affects you and other patients whose appointments are after yours.  Also, if you miss three or more appointments without notifying the office, you may be dismissed from the clinic at the provider's discretion.      For prescription refill requests, have your pharmacy contact our office and allow 72 hours for refills to be completed.    Today you received the following chemotherapy and/or immunotherapy agents: Abraxane / Gemcitabine     To help prevent nausea and vomiting after your treatment, we encourage you to take your nausea medication as directed.  BELOW ARE SYMPTOMS THAT SHOULD BE REPORTED IMMEDIATELY: *FEVER GREATER THAN 100.4 F (38 C) OR HIGHER *CHILLS OR SWEATING *NAUSEA AND VOMITING THAT IS NOT CONTROLLED WITH YOUR NAUSEA MEDICATION *UNUSUAL SHORTNESS OF BREATH *UNUSUAL BRUISING OR BLEEDING *URINARY PROBLEMS (pain or burning when urinating, or frequent urination) *BOWEL PROBLEMS (unusual diarrhea, constipation, pain near the anus) TENDERNESS IN MOUTH AND THROAT WITH OR WITHOUT PRESENCE OF ULCERS (sore throat, sores in mouth, or a toothache) UNUSUAL RASH, SWELLING OR PAIN  UNUSUAL VAGINAL DISCHARGE OR ITCHING   Items with * indicate a potential emergency and should be followed up as soon as possible or go to the Emergency Department if any problems should occur.  Please show the CHEMOTHERAPY ALERT CARD or IMMUNOTHERAPY ALERT CARD at  check-in to the Emergency Department and triage nurse.  Should you have questions after your visit or need to cancel or reschedule your appointment, please contact [facility] CANCER [place] AT [place]  [phone] and follow the prompts.  Office hours are 8:00 a.m. to 4:30 p.m. Monday - Friday. Please note that voicemails left after 4:00 p.m. may not be returned until the following business day.  We are closed weekends and major holidays. You have access to a nurse at all times for urgent questions. Please call the main number to the clinic [phone] and follow the prompts.  For any non-urgent questions, you may also contact your provider using MyChart. We now offer e-Visits for anyone 22 and older to request care online for non-urgent symptoms. For details visit [email or web address].   Also download the MyChart app! Go to the app store, search "MyChart", open the app, select [facility], and log in with your MyChart username and password.  Masks are optional in the cancer centers. If you would like for your care team to wear a mask while they are taking care of you, please let them know. For doctor visits, patients may have with them one support person who is at least 66 years old. At this time, visitors are not allowed in the infusion area.

## 2022-02-09 NOTE — Progress Notes (Signed)
Patient tolerated last Abraxane / Gemcitabine  infusion well, no questions/concerns voiced. Patient stable at discharge. AVS given.

## 2022-02-10 ENCOUNTER — Inpatient Hospital Stay: Payer: Medicare Other

## 2022-02-10 DIAGNOSIS — D709 Neutropenia, unspecified: Secondary | ICD-10-CM

## 2022-02-10 DIAGNOSIS — Z5111 Encounter for antineoplastic chemotherapy: Secondary | ICD-10-CM | POA: Diagnosis not present

## 2022-02-10 LAB — CANCER ANTIGEN 19-9: CA 19-9: 5 U/mL (ref 0–35)

## 2022-02-10 MED ORDER — FILGRASTIM-SNDZ 480 MCG/0.8ML IJ SOSY
480.0000 ug | PREFILLED_SYRINGE | Freq: Once | INTRAMUSCULAR | Status: AC
  Administered 2022-02-10: 480 ug via SUBCUTANEOUS
  Filled 2022-02-10: qty 0.8

## 2022-02-12 ENCOUNTER — Other Ambulatory Visit: Payer: Self-pay | Admitting: Oncology

## 2022-02-16 ENCOUNTER — Encounter: Payer: Self-pay | Admitting: Family Medicine

## 2022-02-16 ENCOUNTER — Ambulatory Visit (INDEPENDENT_AMBULATORY_CARE_PROVIDER_SITE_OTHER): Payer: Medicare Other | Admitting: Family Medicine

## 2022-02-16 VITALS — BP 133/80 | HR 55 | Temp 98.2°F | Wt 136.0 lb

## 2022-02-16 DIAGNOSIS — S80812A Abrasion, left lower leg, initial encounter: Secondary | ICD-10-CM | POA: Diagnosis not present

## 2022-02-16 DIAGNOSIS — L089 Local infection of the skin and subcutaneous tissue, unspecified: Secondary | ICD-10-CM | POA: Diagnosis not present

## 2022-02-16 MED ORDER — CEFDINIR 300 MG PO CAPS: 600.0000 mg | ORAL_CAPSULE | Freq: Every day | ORAL | 0 refills | Status: AC

## 2022-02-16 NOTE — Progress Notes (Signed)
I,Roshena L Chambers,acting as a scribe for Lelon Huh, MD.,have documented all relevant documentation on the behalf of Lelon Huh, MD,as directed by  Lelon Huh, MD while in the presence of Lelon Huh, MD.   Established patient visit   Patient: Cassandra [name]   DOB: Aug 29, 1955   66 y.o. Female  MRN: [id number] Visit Date: 02/16/2022  Today's healthcare provider: Lelon Huh, MD   Chief Complaint  Patient presents with   Laceration   Subjective    Laceration  Incident onset: 2 days ago. The laceration is located on the Left leg. Injury mechanism: scratch from dog. The pain is mild. The pain has been Worsening since onset. Her tetanus status is UTD.   Patient also reports redness around the wound site. Last Tdap was 05/12/2017 Patient receives chemotherapy treatment. Her most recent treatment was about 1 week ago.   Medications: Outpatient Medications Prior to Visit  Medication Sig   Calcium Carbonate-Vit D-Min (CALCIUM 1200 PO) Take by mouth.   Cholecalciferol 25 MCG (1000 UT) tablet Take 2,000 Units by mouth daily.   clobetasol cream (TEMOVATE) 0.05 %    CREON 24000-76000 units CPEP TAKE 1 CAPSULE THREE TIMES DAILY BEFORE MEALS   Cyanocobalamin (B-12 PO) Take by mouth.   lidocaine-prilocaine (EMLA) cream APPLY TO AFFECTED AREA ONCE AS DIRECTED   Multiple Vitamins-Minerals (MULTIVITAMIN WITH MINERALS) tablet Take 1 tablet by mouth daily.   ondansetron (ZOFRAN-ODT) 4 MG disintegrating tablet Take 4 mg by mouth every 8 (eight) hours as needed.   prochlorperazine (COMPAZINE) 10 MG tablet TAKE 1 TABLET (10 MG TOTAL) BY MOUTH EVERY 6 (SIX) HOURS AS NEEDED (NAUSEA OR VOMITING).   simethicone (GAS-X) 80 MG chewable tablet Chew 1 tablet (80 mg total) by mouth every 8 (eight) hours as needed for flatulence.   Facility-Administered Medications Prior to Visit  Medication Dose Route Frequency Provider   heparin lock flush 100 UNIT/ML injection        prochlorperazine  (COMPAZINE) 10 MG tablet        sodium chloride flush (NS) 0.9 % injection 10 mL  10 mL Intravenous PRN [name], MD   sodium chloride flush (NS) 0.9 % injection 10 mL  10 mL Intravenous Once Borders, Vonna Kotyk R, NP   sodium chloride flush (NS) 0.9 % injection 10 mL  10 mL Intravenous Once [name], MD    Review of Systems  Constitutional:  Negative for appetite change, chills, fatigue and fever.  Respiratory:  Negative for chest tightness and shortness of breath.   Cardiovascular:  Negative for chest pain and palpitations.  Gastrointestinal:  Negative for abdominal pain, nausea and vomiting.  Neurological:  Negative for dizziness and weakness.       Objective    BP 133/80 (BP Location: Right Arm, Patient Position: Sitting, Cuff Size: Normal)   Pulse (!) 55   Temp 98.2 F (36.8 C) (Oral)   Wt 136 lb (61.7 kg)   SpO2 100% Comment: room air  BMI 21.30 kg/m    Physical Exam  Nickel sized wound left lower leg with small central erosion with scant clear yellow drainage. Slightly inflamed and tender around borders of wound. No erythema or streaks extending away from wound. So swelling of leg or foot.     Assessment & Plan     1. Infected abrasion of left lower extremity, initial encounter She has just recently completely chemo and is still likely immunocompromised. Considering wound is not healing after 7 days and  is more sore today that yesterday will start cefdinir (OMNICEF) 300 MG capsule; Take 2 capsules (600 mg total) by mouth daily for 7 days.  Dispense: 14 capsule; Refill: 0   Call if symptoms change or if not rapidly improving.        The entirety of the information documented in the History of Present Illness, Review of Systems and Physical Exam were personally obtained by me. Portions of this information were initially documented by the CMA and reviewed by me for thoroughness and accuracy.     Lelon Huh, MD  [facility] [phone]  (phone) [phone] (fax)  [place]

## 2022-03-07 ENCOUNTER — Other Ambulatory Visit: Payer: Self-pay | Admitting: Oncology

## 2022-03-17 ENCOUNTER — Telehealth: Payer: Self-pay

## 2022-03-17 NOTE — Patient Outreach (Signed)
  Care Coordination   03/17/2022 Name: Cassandra [name] MRN: [id number] DOB: May 10, 1956   Care Coordination Outreach Attempts:  An unsuccessful telephone outreach was attempted today to offer the patient information about available care coordination services as a benefit of their health plan.   Follow Up Plan:  Additional outreach attempts will be made to offer the patient care coordination information and services.   Encounter Outcome:  No Answer  Care Coordination Interventions Activated:  No   Care Coordination Interventions:  No, not indicated    Noreene Larsson RN, MSN, [facility]  Mobile: [phone]

## 2022-03-29 ENCOUNTER — Telehealth: Payer: Self-pay | Admitting: *Deleted

## 2022-03-29 NOTE — Telephone Encounter (Signed)
Per MD, she does not need antibiotics.

## 2022-03-29 NOTE — Telephone Encounter (Signed)
Patient called reporting that she has a dental appointment coming up and that she completed chemotherapy in August. She is asking if she still needs antibiotics before dental or not. Please advise

## 2022-03-29 NOTE — Telephone Encounter (Signed)
Call returned to patient and informed of doctor response 

## 2022-04-05 ENCOUNTER — Inpatient Hospital Stay: Payer: Medicare Other | Attending: Oncology

## 2022-04-05 DIAGNOSIS — Z87891 Personal history of nicotine dependence: Secondary | ICD-10-CM | POA: Diagnosis not present

## 2022-04-05 DIAGNOSIS — Z5111 Encounter for antineoplastic chemotherapy: Secondary | ICD-10-CM

## 2022-04-05 DIAGNOSIS — D6481 Anemia due to antineoplastic chemotherapy: Secondary | ICD-10-CM | POA: Insufficient documentation

## 2022-04-05 DIAGNOSIS — C250 Malignant neoplasm of head of pancreas: Secondary | ICD-10-CM | POA: Insufficient documentation

## 2022-04-05 DIAGNOSIS — Z95828 Presence of other vascular implants and grafts: Secondary | ICD-10-CM

## 2022-04-05 DIAGNOSIS — C787 Secondary malignant neoplasm of liver and intrahepatic bile duct: Secondary | ICD-10-CM | POA: Diagnosis present

## 2022-04-05 DIAGNOSIS — K8689 Other specified diseases of pancreas: Secondary | ICD-10-CM | POA: Diagnosis not present

## 2022-04-05 DIAGNOSIS — G620 Drug-induced polyneuropathy: Secondary | ICD-10-CM | POA: Diagnosis not present

## 2022-04-05 DIAGNOSIS — T451X5A Adverse effect of antineoplastic and immunosuppressive drugs, initial encounter: Secondary | ICD-10-CM | POA: Insufficient documentation

## 2022-04-05 DIAGNOSIS — C259 Malignant neoplasm of pancreas, unspecified: Secondary | ICD-10-CM

## 2022-04-05 LAB — CBC WITH DIFFERENTIAL/PLATELET
Abs Immature Granulocytes: 0.01 10*3/uL (ref 0.00–0.07)
Basophils Absolute: 0 10*3/uL (ref 0.0–0.1)
Basophils Relative: 0 %
Eosinophils Absolute: 0.1 10*3/uL (ref 0.0–0.5)
Eosinophils Relative: 3 %
HCT: 38.9 % (ref 36.0–46.0)
Hemoglobin: 13.6 g/dL (ref 12.0–15.0)
Immature Granulocytes: 0 %
Lymphocytes Relative: 30 %
Lymphs Abs: 1.5 10*3/uL (ref 0.7–4.0)
MCH: 33.5 pg (ref 26.0–34.0)
MCHC: 35 g/dL (ref 30.0–36.0)
MCV: 95.8 fL (ref 80.0–100.0)
Monocytes Absolute: 0.4 10*3/uL (ref 0.1–1.0)
Monocytes Relative: 9 %
Neutro Abs: 2.8 10*3/uL (ref 1.7–7.7)
Neutrophils Relative %: 58 %
Platelets: 184 10*3/uL (ref 150–400)
RBC: 4.06 MIL/uL (ref 3.87–5.11)
RDW: 12.1 % (ref 11.5–15.5)
WBC: 4.9 10*3/uL (ref 4.0–10.5)
nRBC: 0 % (ref 0.0–0.2)

## 2022-04-05 LAB — COMPREHENSIVE METABOLIC PANEL
ALT: 30 U/L (ref 0–44)
AST: 34 U/L (ref 15–41)
Albumin: 3.8 g/dL (ref 3.5–5.0)
Alkaline Phosphatase: 86 U/L (ref 38–126)
Anion gap: 4 — ABNORMAL LOW (ref 5–15)
BUN: 14 mg/dL (ref 8–23)
CO2: 26 mmol/L (ref 22–32)
Calcium: 8.5 mg/dL — ABNORMAL LOW (ref 8.9–10.3)
Chloride: 107 mmol/L (ref 98–111)
Creatinine, Ser: 0.64 mg/dL (ref 0.44–1.00)
GFR, Estimated: >60 mL/min (ref >60)
Glucose, Bld: 100 mg/dL — ABNORMAL HIGH (ref 70–99)
Potassium: 4.1 mmol/L (ref 3.5–5.1)
Sodium: 137 mmol/L (ref 135–145)
Total Bilirubin: 0.7 mg/dL (ref 0.3–1.2)
Total Protein: 6.6 g/dL (ref 6.5–8.1)

## 2022-04-05 MED ORDER — HEPARIN SOD (PORK) LOCK FLUSH 100 UNIT/ML IV SOLN
500.0000 [IU] | Freq: Once | INTRAVENOUS | Status: AC
  Administered 2022-04-05: 500 [IU] via INTRAVENOUS
  Filled 2022-04-05: qty 5

## 2022-04-05 MED ORDER — SODIUM CHLORIDE 0.9% FLUSH
10.0000 mL | Freq: Once | INTRAVENOUS | Status: AC
  Administered 2022-04-05: 10 mL via INTRAVENOUS
  Filled 2022-04-05: qty 10

## 2022-04-06 ENCOUNTER — Inpatient Hospital Stay: Payer: Medicare Other

## 2022-04-06 LAB — CANCER ANTIGEN 19-9: CA 19-9: 7 U/mL (ref 0–35)

## 2022-04-12 ENCOUNTER — Inpatient Hospital Stay (HOSPITAL_BASED_OUTPATIENT_CLINIC_OR_DEPARTMENT_OTHER): Payer: Medicare Other | Admitting: Oncology

## 2022-04-12 ENCOUNTER — Encounter: Payer: Self-pay | Admitting: Oncology

## 2022-04-12 ENCOUNTER — Other Ambulatory Visit: Payer: Self-pay

## 2022-04-12 ENCOUNTER — Inpatient Hospital Stay: Payer: Medicare Other | Admitting: Oncology

## 2022-04-12 DIAGNOSIS — G620 Drug-induced polyneuropathy: Secondary | ICD-10-CM

## 2022-04-12 DIAGNOSIS — D6481 Anemia due to antineoplastic chemotherapy: Secondary | ICD-10-CM | POA: Diagnosis not present

## 2022-04-12 DIAGNOSIS — C259 Malignant neoplasm of pancreas, unspecified: Secondary | ICD-10-CM

## 2022-04-12 DIAGNOSIS — C250 Malignant neoplasm of head of pancreas: Secondary | ICD-10-CM | POA: Diagnosis not present

## 2022-04-12 DIAGNOSIS — K8689 Other specified diseases of pancreas: Secondary | ICD-10-CM

## 2022-04-12 DIAGNOSIS — T451X5A Adverse effect of antineoplastic and immunosuppressive drugs, initial encounter: Secondary | ICD-10-CM

## 2022-04-12 NOTE — Assessment & Plan Note (Signed)
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 Continue calcium supplementation, 2000 mg daily-divided in 2-3 doses

## 2022-04-12 NOTE — Assessment & Plan Note (Addendum)
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  Pathology proved liver metastatic disease- additional chemotherapy --> whipple procedure.--> adjuvant Gem/Abraxane finished in August 2023. [id number] ypN0 Labs reviewed and discussed with patient -  September 2023 CT scan at [facility] shows no evidence of cancer recurrence.-NED Continue clinical and imaging surveillance.

## 2022-04-12 NOTE — Assessment & Plan Note (Addendum)
fingertips and toes, grade 1/2.  She did not tolerate Cymbalta.   Stable symptoms. Acupuncture sessions as needed..  

## 2022-04-12 NOTE — Assessment & Plan Note (Signed)
status post Whipple procedure..  Continue Creon, continue 24000 units TID with meals and  with her snacks 

## 2022-04-12 NOTE — Assessment & Plan Note (Signed)
Hemoglobin is stable.  Monitor. 

## 2022-04-12 NOTE — Progress Notes (Signed)
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  Pathology proved liver metastatic disease- additional chemotherapy --> whipple procedure.--> adjuvant Gem/Abraxane finished in August 2023. ypT1b ypN0 Labs reviewed and discussed with patient -  September 2023 CT scan at [facility] shows no evidence of cancer recurrence.-NED Continue clinical and imaging surveillance.  Chemotherapy-induced neuropathy (HCC) fingertips and toes, grade 1/2.  She did not tolerate Cymbalta.   Stable symptoms. Acupuncture sessions as needed..   Pancreatic insufficiency  status post Whipple procedure..  Continue Creon, continue 24000 units TID with meals and  with her snacks  Anemia due to antineoplastic chemotherapy Hemoglobin is stable. Monitor.   Hypocalcemia Likely due to malabsorption. Continue calcium supplementation, 2000 mg daily-divided in 2-3 doses   Orders Placed This Encounter  Procedures   CA 19-9 (SERIAL)    Standing Status:   Future    Standing Expiration Date:   04/13/2023   Cancer antigen 19-9    Standing Status:   Future    Standing Expiration Date:   04/12/2023    Follow up [place] flush in November 2023, Follow-up in 03 July 2022  All questions were answered. The patient knows to call the clinic with any problems,  questions or concerns.  [name], MD, PhD [facility] Hematology Oncology 04/12/2022   Cassandra Romp [name], MD  CHIEF COMPLAINTS/REASON FOR VISIT:  Follow up for treatment of pancreatic adenocarcinoma  HISTORY OF PRESENTING ILLNESS:   Cassandra [name] is a  66 y.o.  female with PMH listed below was seen in consultation at the request of  [name], MD  for evaluation of pancreatic adenocarcinoma Patient initially presented with jaundice, transaminitis, bilirubin was 9.9.  CA 19-9 was 1874.  Patient also reports unintentional weight loss. 02/08/2020 MRI abdomen and MRCP with and without contrast was done at [facility] which showed pancreatic head mass measuring up to 3 cm, with marked associated narrowing of the portal vein confluence.  SMA is preserved.  Marked intrahepatic and extrahepatic biliary duct dilatation as well as mild dilatation of the main pancreatic duct. Multiple hepatic masses highly concerning for metastatic disease.  Patient underwent EUS on 02/19/2020, which showed irregular mass identified in the pancreatic head, hypoechoic, measured 22mmx33mm, sonographic evidence concerning for invasion into the superior mesenteric artery.  There is no sign of significant abnormality in the main pancreatic duct.  Dilatation of common bile duct which measured up to 16 mm.  Region of celiac artery was visualized and showed no signs of significant abnormality.  No lymphadenopathy.  FNA showed adenocarcinoma.  02/19/2020, ERCP, malignant.  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  The stricture was treated with placement of wall flex metal stent.  Patient was seen by [facility] oncology Dr. Pia Mau and was recommended for 3 drug regimen FOLFIRINOX.  Patient prefers to do chemotherapy locally at [facility].  Patient was  referred to establish care today. She denies any pain.  Since stent placement, skin jaundice has improved.  Itchiness has also  improved. Patient was accompanied by her husband today.  She has a family history of breast cancer in sister and paternal aunt, colon cancer paternal grandmother.  #No reportable targetable mutation on NGS 9/14/2021cycle 1 FOLFIRINOX.  Patient received oxaliplatin and about 50% of Irinotecan on day 1 and had experienced neurologic symptoms.  She went to ER and working diagnosis is TIA, and eventually I think this is due to irinotecan side effects -irinotecan-associated dysarthria, lip/tongue numbness.  Adjustment was made for Irinotecan to be  infused over 180 minutes.  Atropine 0.5 mg once prior to the irinotecan. No recurrent symptoms.   # 03/19/2020-03/21/2020 patient was admitted due to sepsis with strep pneumonia bacteremia.  Patient was treated with IV Rocephin.  TEE was done which showed no vegetation.  No PFO or ASD.  Patient was discharged home and he finished full course of 14 days of IV Rocephin on 04/02/2020 per ID recommendation.  Repeat blood culture was also negative.  08/25/20 cycle 10 FOLFIRINOX 09/08/20- present  Starting cycle 11, FOLFIRI, Oxaliplatin discontinued due to neuropathy   #NGS showed no reportable targetable mutation #Genetic testing-[employer] diagnostic testing showed no pathological variants identified. MS stable, TMB 7mut/mb, KRAS G12D, SF3B1 K700E, TP53 V287fs*30  #01/14/2021  CT done at [facility] during the interval, stable disease.  # Her case was presented by Dr.Jia from [facility] tumor board. [place] Scrape were 4~5 liver lesions on OSH MRI last year at the time of diagnosis highly suspicious for metastasis. She is not eligible for surgical protocol for metastasis resection given the number of liver metastasis is >3. However given her excellent and durable response to chemo, surgical resection may be considered pending sustained disease control at 1 year and may require partial hepatectomy first to see if there is viable tumor before proceeding to Whipple resection ]  Patient had a  COVID-19 infection in September 2022.  03/23/2021 CT abdomen pelvis No significant change in the ill-defined pancreatic head mass or  associated biliary ductal and pancreatic ductal dilation.  Slight interval enlargement of subcentimeter anterior peripancreatic  lymph node, nonspecific. Attention on follow-up per clinical protocol. Similar enlargement of the ascending thoracic aorta measuring 4.4 cm  03/23/2021 MRI abdomen w and wo  Increasing ill-defined hypoenhancement of the pancreatic head surrounding the common bile duct stent, in keeping with known pancreatic malignancy. Anterior peripancreatic lymph node is better evaluated on CT.  Unchanged position of a common bile duct stent with similar mild diffuse intrahepatic ductal dilation and pneumobilia. No evidence of metastatic disease in the abdomen or pelvis. Partially visualized cystic left adnexal lesion measuring up to 3.9 cm.   04/10/2021, patient underwent liver resection at [facility], by Dr. [name].   Pathology report from [facility] was reviewed. Segments 3 partial hepatectomy, negative for viable tumor. Section 5/6 partial hepatectomy part 1 and 2, microscopic foci of residual viable adenocarcinoma, morphologically consistent with history of pancreatic primary.  Parenchymal margin is uninvolved.  #Patient resumed on FOLFIRI on 04/28/2021. She got another cycle on 05/12/2021 #05/26/2021, patient did not get additional chemotherapy due to transaminitis.  Shared decision was made to stop FOLFIRI and switch to gemcitabine Abraxane treatments.  05/26/2021, patient developed transaminitis, AST 763, ALT 691, alkaline phosphatase 364.  Bilirubin 0.7 05/26/2021 stat ultrasound abdomen right upper quadrant showed interval development of 3.3 x 1.8 cm complex mass in the right hepatic lobe.  Increased extrahepatic ductal  dilatation.  Concerning for CBD obstruction. 05/29/2021, MRI abdomen MRCP with and without contrast showed unchanged pancreatic head soft  tissue.  Severe intra and extrahepatic biliary ductal dilatation.  Common bile duct stents remain in position however patency is not established.  No evidence of lymphadenopathy or metastatic disease in the abdomen.  With further communication with radiologist, addendum was added that there is internal fluid signal and no appreciable associated contrast enhancement measuring 3.4 x 2.3 cm.  This appearance is generally not consistent with metastasis and is of uncertain significance.  Possibly reflecting hepatic abscess or residual of subcapsular hematoma.  Patient was seen by [facility] Dr. Elon Jester team.  He had ERCP done on 06/05/2021, with findings of One stent from the biliary tree was seen in the major papilla. The stent had migrated significantly into the duodenum. This is the cause of stent malfunction. One stent was removed from the biliary tree. Prior biliary sphincterotomy appeared open. - A single severe biliary stricture was found in the lower third of the main bile duct with upstream dilation. The stricture was alignant appearing.- The biliary tree was swept and sludge was found.- One fully covered metal stent was placed into the common bile duct across the stricture.- No pancreatogram performed.  06/12/2021-08/10/2021 gemcitabine and Abraxane.   10/14/2021, patient underwent Whipple procedure. Liver biopsy negative for malignancy. Review procedure showed invasive adenocarcinoma, moderately differentiated, centered in pancreatic head and confined in the Pancreas. Surgical margin is negative for malignancy.  36 lymph nodes were all negative for malignancy.  1 hepatic artery lymph node was harvested and was negative.  Gallbladder negative for malignancy.  Cystic duct excision negative for malignancy. pT1b pN0  10/25/2021, CT abdomen pelvis with contrast showed rim-enhancing fluid collection in the region of the hepatic hilum. Patient had a readmission for superficial wound infection.  She completed  antibiotics on 11/03/2021.  11/03/2021, CT abdomen pelvis with contrast showed near resolution of previously seen fluid collection in the region of the hepatic hilum.  Similar appearance of the area of hypoenhancement in the liver.  Attention on follow-up.  11/17/2021, resumed on gemcitabine/Abraxane.  INTERVAL HISTORY AMIRRAH QUIGLEY is a 66 y.o. female who has above history reviewed by me today presents for pancreatic cancer.   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 Patient has no new complaints.  Weight has been stable.  She takes calcium supplementation. Recent CT scan at [facility] showed no evidence of recurrence.  Review of Systems  Constitutional:  Negative for appetite change, chills, fatigue, fever and unexpected weight change.  HENT:   Negative for hearing loss and voice change.   Eyes:  Negative for eye problems.  Respiratory:  Negative for chest tightness and cough.   Cardiovascular:  Negative for chest pain.  Gastrointestinal:  Negative for abdominal distention, abdominal pain, blood in stool and nausea.  Endocrine: Negative for hot flashes.  Genitourinary:  Negative for difficulty urinating and frequency.   Musculoskeletal:  Negative for arthralgias.  Skin:  Negative for itching and rash.  Neurological:  Positive for numbness. Negative for extremity weakness.  Hematological:  Negative for adenopathy.  Psychiatric/Behavioral:  Negative for confusion.     MEDICAL HISTORY:  Past Medical History:  Diagnosis Date   Anemia    Cancer (HCC)    pancreatic cancer   Colon polyps    Family history of breast cancer    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   Neutropenia (HCC) 04/18/2020  Osteopenia after menopause 05/2017   femoral neck T score -2.0   Personal history of chemotherapy    current for pancreatic ca   PONV (postoperative nausea and vomiting)    Pure hypercholesterolemia     SURGICAL HISTORY: Past Surgical History:  Procedure Laterality  Date   COLONOSCOPY  07/2015   WNL   COLONOSCOPY  2008/2011   COLONOSCOPY WITH PROPOFOL N/A 07/10/2021   Procedure: COLONOSCOPY WITH PROPOFOL;  Surgeon: [name], MD;  Location: [facility] ENDOSCOPY;  Service: Endoscopy;  Laterality: N/A;   IR CV LINE INJECTION  04/28/2021   OVARIAN CYST REMOVAL  1992   dermoid-Dr CAK   PORTA CATH INSERTION N/A 03/07/2020   Procedure: PORTA CATH INSERTION;  Surgeon: Algernon Huxley, MD;  Location: [place] CV LAB;  Service: Cardiovascular;  Laterality: N/A;   TEE WITHOUT CARDIOVERSION N/A 03/21/2020   Procedure: TRANSESOPHAGEAL ECHOCARDIOGRAM (TEE);  Surgeon: Dionisio David, MD;  Location: [facility] ORS;  Service: Cardiovascular;  Laterality: N/A;   TUBAL LIGATION  1993    SOCIAL HISTORY: Social History   Socioeconomic History   Marital status: Married    Spouse name: Not on file   Number of children: 2   Years of education: Not on file   Highest education level: Not on file  Occupational History   Occupation: Pharmacist, hospital    Comment: retired   Occupation: Psychologist, sport and exercise  Tobacco Use   Smoking status: Former    Packs/day: 1.00    Years: 12.00    Total pack years: 12.00    Types: Cigarettes    Quit date: 1987    Years since quitting: 36.8   Smokeless tobacco: Never   Tobacco comments:    Quit smoking 1987  Vaping Use   Vaping Use: Never used  Substance and Sexual Activity   Alcohol use: Not Currently    Alcohol/week: 7.0 standard drinks of alcohol    Types: 7 Glasses of wine per week    Comment: 0-2 mixed drinks a day   Drug use: No   Sexual activity: Not Currently    Birth control/protection: Post-menopausal  Other Topics Concern   Not on file  Social History Narrative   Not on file   Social Determinants of Health   Financial Resource Strain: Low Risk  (10/08/2021)   Overall Financial Resource Strain (CARDIA)    Difficulty of Paying Living Expenses: Not hard at all  Food Insecurity: No Food Insecurity (10/08/2021)   Hunger Vital Sign     Worried About Running Out of Food in the Last Year: Never true    Ran Out of Food in the Last Year: Never true  Transportation Needs: No Transportation Needs (10/08/2021)   PRAPARE - Hydrologist (Medical): No    Lack of Transportation (Non-Medical): No  Physical Activity: Sufficiently Active (10/08/2021)   Exercise Vital Sign    Days of Exercise per Week: 5 days    Minutes of Exercise per Session: 40 min  Stress: No Stress Concern Present (10/08/2021)   [place]    Feeling of Stress : Not at all  Social Connections: Moderately Isolated (10/08/2021)   Social Connection and Isolation Panel [NHANES]    Frequency of Communication with Friends and Family: More than three times a week    Frequency of Social Gatherings with Friends and Family: Three times a week    Attends Religious Services: Never    Active Member of  Clubs or Organizations: No    Attends Archivist Meetings: Never    Marital Status: Married  Human resources officer Violence: Not At Risk (10/08/2021)   Humiliation, Afraid, Rape, and Kick questionnaire    Fear of Current or Ex-Partner: No    Emotionally Abused: No    Physically Abused: No    Sexually Abused: No    FAMILY HISTORY: Family History  Problem Relation Age of Onset   Breast cancer Paternal Aunt 90   Diabetes Mother    Osteoporosis Mother    Hyperlipidemia Father    Rheumatic fever Father    Valvular heart disease Father    Cancer Paternal Grandmother        possible colon   Breast cancer Sister 19    ALLERGIES:  is allergic to penicillin g.  MEDICATIONS:  Current Outpatient Medications  Medication Sig Dispense Refill   Calcium Carbonate-Vit D-Min (CALCIUM 1200 PO) Take by mouth.     Cholecalciferol 25 MCG (1000 UT) tablet Take 2,000 Units by mouth daily.     clobetasol cream (TEMOVATE) 0.05 %      CREON 24000-76000 units CPEP TAKE 1 CAPSULE THREE TIMES  DAILY BEFORE MEALS 200 capsule 3   Cyanocobalamin (B-12 PO) Take by mouth.     lidocaine-prilocaine (EMLA) cream APPLY TO AFFECTED AREA ONCE AS DIRECTED 30 g 6   Multiple Vitamins-Minerals (MULTIVITAMIN WITH MINERALS) tablet Take 1 tablet by mouth daily.     simethicone (GAS-X) 80 MG chewable tablet Chew 1 tablet (80 mg total) by mouth every 8 (eight) hours as needed for flatulence. 60 tablet 0   ondansetron (ZOFRAN-ODT) 4 MG disintegrating tablet Take 4 mg by mouth every 8 (eight) hours as needed. (Patient not taking: Reported on 04/12/2022)     prochlorperazine (COMPAZINE) 10 MG tablet TAKE 1 TABLET (10 MG TOTAL) BY MOUTH EVERY 6 (SIX) HOURS AS NEEDED (NAUSEA OR VOMITING). (Patient not taking: Reported on 04/12/2022)     No current facility-administered medications for this visit.   Facility-Administered Medications Ordered in Other Visits  Medication Dose Route Frequency Provider Last Rate Last Admin   heparin lock flush 100 UNIT/ML injection            prochlorperazine (COMPAZINE) 10 MG tablet            sodium chloride flush (NS) 0.9 % injection 10 mL  10 mL Intravenous PRN [name], MD   10 mL at 02/18/21 0858   sodium chloride flush (NS) 0.9 % injection 10 mL  10 mL Intravenous Once Borders, Vonna Kotyk R, NP       sodium chloride flush (NS) 0.9 % injection 10 mL  10 mL Intravenous Once [name], MD         PHYSICAL EXAMINATION: ECOG PERFORMANCE STATUS: 0 - Asymptomatic Vitals:   04/12/22 1034  BP: (!) 142/97  Pulse: (!) 53  Resp: 18  Temp: (!) 97.2 F (36.2 C)   Filed Weights   04/12/22 1034  Weight: 136 lb 6.4 oz (61.9 kg)    Physical Exam Constitutional:      General: She is not in acute distress. HENT:     Head: Normocephalic and atraumatic.  Eyes:     General: No scleral icterus. Cardiovascular:     Rate and Rhythm: Normal rate and regular rhythm.     Heart sounds: Normal heart sounds.  Pulmonary:     Effort: Pulmonary effort is normal. No respiratory distress.      Breath sounds: No  wheezing.  Abdominal:     General: Bowel sounds are normal. There is no distension.     Palpations: Abdomen is soft.  Musculoskeletal:        General: No deformity. Normal range of motion.     Cervical back: Normal range of motion and neck supple.  Skin:    General: Skin is warm and dry.     Coloration: Skin is not jaundiced.     Comments: Left basal cell carcinoma status post Mohs surgery  Neurological:     Mental Status: She is alert and oriented to person, place, and time. Mental status is at baseline.     Cranial Nerves: No cranial nerve deficit.     Coordination: Coordination normal.  Psychiatric:        Mood and Affect: Mood normal.     LABORATORY DATA:  I have reviewed the data as listed    Latest Ref Rng & Units 04/05/2022    9:20 AM 02/09/2022    8:11 AM 02/02/2022    8:25 AM  CBC  WBC 4.0 - 10.5 K/uL 4.9  3.4  4.1   Hemoglobin 12.0 - 15.0 g/dL 13.6  11.3  11.9   Hematocrit 36.0 - 46.0 % 38.9  34.0  35.4   Platelets 150 - 400 K/uL 184  222  170       Latest Ref Rng & Units 04/05/2022    9:20 AM 02/09/2022    8:11 AM 02/02/2022    8:25 AM  CMP  Glucose 70 - 99 mg/dL 100  100  126   BUN 8 - 23 mg/dL $Remove'14  16  13   'sTrTkYC$ Creatinine 0.44 - 1.00 mg/dL 0.64  0.66  0.72   Sodium 135 - 145 mmol/L 137  141  141   Potassium 3.5 - 5.1 mmol/L 4.1  4.4  4.4   Chloride 98 - 111 mmol/L 107  110  108   CO2 22 - 32 mmol/L $RemoveB'26  25  25   'JlZepqtX$ Calcium 8.9 - 10.3 mg/dL 8.5  8.7  9.0   Total Protein 6.5 - 8.1 g/dL 6.6  6.5  6.6   Total Bilirubin 0.3 - 1.2 mg/dL 0.7  0.4  0.2   Alkaline Phos 38 - 126 U/L 86  82  89   AST 15 - 41 U/L 34  33  31   ALT 0 - 44 U/L $Remo'30  26  23      'SRjQS$ RADIOGRAPHIC STUDIES: I have personally reviewed the radiological images as listed and agreed with the findings in the report. Reviewed findings of MRI abdomen MRCP done at [facility]. No results found.

## 2022-04-23 ENCOUNTER — Telehealth: Payer: Self-pay | Admitting: *Deleted

## 2022-04-23 NOTE — Patient Outreach (Signed)
  Care Coordination   Initial Visit Note   04/23/2022 Name: ROSLIND MICHAUX MRN: [id number] DOB: [birth year]  Einar Grad is a 66 y.o. year old female who sees Bacigalupo, [name], MD for primary care. I spoke with  Einar Grad by phone today.  What matters to the patients health and wellness today?  State she is in remission from cancer after 2 years, working to stay in remission.  Has frequent follow up with oncology for provider appointments as well as labs and scans.  Does not feel the need for follow up call at this time.  Denies any urgent concerns, encouraged to contact this care manager with questions.     Goals Addressed             This Visit's Progress    COMPLETED: Care Coordination Activities - No follow up needed       Care Coordination Interventions: Assessed patient understanding of cancer diagnosis and recommended treatment plan Reviewed upcoming provider appointments and treatment appointments Assessed available transportation to appointments and treatments. Has consistent/reliable transportation: Yes Assessed support system. Has consistent/reliable family or other support: Yes SDOH assessment completed Encouraged to reach out to PCP office regarding interest in obtaining Vitamin D level as well as pneumonia vaccination         SDOH assessments and interventions completed:  Yes  SDOH Interventions Today    Flowsheet Row Most Recent Value  SDOH Interventions   Food Insecurity Interventions Intervention Not Indicated  Housing Interventions Intervention Not Indicated  Transportation Interventions Intervention Not Indicated  Utilities Interventions Intervention Not Indicated        Care Coordination Interventions Activated:  Yes  Care Coordination Interventions:  Yes, provided   Follow up plan: No further intervention required.   Encounter Outcome:  Pt. Visit Completed   Valente David, RN, MSN, [facility] Care Management Care Management  Coordinator [phone]

## 2022-04-23 NOTE — Patient Instructions (Signed)
Visit Information  Thank you for taking time to visit with me today. Please don't hesitate to contact me if I can be of assistance to you.  Following are the goals we discussed today:  Call PCP office to inquire about Vitamin D levels and pneumonia vaccination  Please call the Suicide and Crisis Lifeline: 988 call the [place] National Suicide Prevention Lifeline: 251 266 5399 or TTY: 234-353-2278 TTY (912)748-2922) to talk to a trained counselor call 1-800-273-TALK (toll free, 24 hour hotline) call 911 if you are experiencing a Mental Health or [place] or need someone to talk to.  Patient verbalizes understanding of instructions and care plan provided today and agrees to view in [place]. Active MyChart status and patient understanding of how to access instructions and care plan via MyChart confirmed with patient.     The patient has been provided with contact information for the care management team and has been advised to call with any health related questions or concerns.   Valente David, RN, MSN, [facility] Care Management Care Management Coordinator [phone]

## 2022-04-26 ENCOUNTER — Encounter (INDEPENDENT_AMBULATORY_CARE_PROVIDER_SITE_OTHER): Payer: Self-pay

## 2022-05-14 ENCOUNTER — Other Ambulatory Visit: Payer: Self-pay

## 2022-05-14 DIAGNOSIS — C259 Malignant neoplasm of pancreas, unspecified: Secondary | ICD-10-CM

## 2022-05-17 ENCOUNTER — Inpatient Hospital Stay: Payer: Medicare Other | Attending: Oncology

## 2022-05-17 DIAGNOSIS — C250 Malignant neoplasm of head of pancreas: Secondary | ICD-10-CM | POA: Diagnosis present

## 2022-05-17 DIAGNOSIS — C787 Secondary malignant neoplasm of liver and intrahepatic bile duct: Secondary | ICD-10-CM | POA: Insufficient documentation

## 2022-05-17 DIAGNOSIS — C259 Malignant neoplasm of pancreas, unspecified: Secondary | ICD-10-CM

## 2022-05-18 LAB — CANCER ANTIGEN 19-9: CA 19-9: 6 U/mL (ref 0–35)

## 2022-06-01 ENCOUNTER — Inpatient Hospital Stay: Payer: Medicare Other

## 2022-07-27 ENCOUNTER — Inpatient Hospital Stay (HOSPITAL_BASED_OUTPATIENT_CLINIC_OR_DEPARTMENT_OTHER): Payer: Medicare Other | Admitting: Oncology

## 2022-07-27 ENCOUNTER — Encounter: Payer: Self-pay | Admitting: Oncology

## 2022-07-27 ENCOUNTER — Inpatient Hospital Stay: Payer: Medicare Other | Attending: Oncology

## 2022-07-27 ENCOUNTER — Inpatient Hospital Stay: Payer: Medicare Other

## 2022-07-27 VITALS — BP 127/82 | HR 52 | Temp 97.3°F | Wt 138.1 lb

## 2022-07-27 DIAGNOSIS — K8689 Other specified diseases of pancreas: Secondary | ICD-10-CM | POA: Diagnosis not present

## 2022-07-27 DIAGNOSIS — Z95828 Presence of other vascular implants and grafts: Secondary | ICD-10-CM

## 2022-07-27 DIAGNOSIS — T451X5A Adverse effect of antineoplastic and immunosuppressive drugs, initial encounter: Secondary | ICD-10-CM | POA: Insufficient documentation

## 2022-07-27 DIAGNOSIS — C259 Malignant neoplasm of pancreas, unspecified: Secondary | ICD-10-CM | POA: Diagnosis not present

## 2022-07-27 DIAGNOSIS — G620 Drug-induced polyneuropathy: Secondary | ICD-10-CM

## 2022-07-27 DIAGNOSIS — C250 Malignant neoplasm of head of pancreas: Secondary | ICD-10-CM | POA: Diagnosis present

## 2022-07-27 DIAGNOSIS — Z87891 Personal history of nicotine dependence: Secondary | ICD-10-CM | POA: Insufficient documentation

## 2022-07-27 DIAGNOSIS — D6481 Anemia due to antineoplastic chemotherapy: Secondary | ICD-10-CM | POA: Insufficient documentation

## 2022-07-27 DIAGNOSIS — Z9221 Personal history of antineoplastic chemotherapy: Secondary | ICD-10-CM | POA: Diagnosis not present

## 2022-07-27 DIAGNOSIS — C787 Secondary malignant neoplasm of liver and intrahepatic bile duct: Secondary | ICD-10-CM | POA: Diagnosis present

## 2022-07-27 LAB — CBC WITH DIFFERENTIAL/PLATELET
Abs Immature Granulocytes: 0.01 10*3/uL (ref 0.00–0.07)
Basophils Absolute: 0 10*3/uL (ref 0.0–0.1)
Basophils Relative: 1 %
Eosinophils Absolute: 0.1 10*3/uL (ref 0.0–0.5)
Eosinophils Relative: 1 %
HCT: 38.4 % (ref 36.0–46.0)
Hemoglobin: 13.3 g/dL (ref 12.0–15.0)
Immature Granulocytes: 0 %
Lymphocytes Relative: 38 %
Lymphs Abs: 1.6 10*3/uL (ref 0.7–4.0)
MCH: 32.9 pg (ref 26.0–34.0)
MCHC: 34.6 g/dL (ref 30.0–36.0)
MCV: 95 fL (ref 80.0–100.0)
Monocytes Absolute: 0.4 10*3/uL (ref 0.1–1.0)
Monocytes Relative: 9 %
Neutro Abs: 2.2 10*3/uL (ref 1.7–7.7)
Neutrophils Relative %: 51 %
Platelets: 179 10*3/uL (ref 150–400)
RBC: 4.04 MIL/uL (ref 3.87–5.11)
RDW: 11.9 % (ref 11.5–15.5)
WBC: 4.3 10*3/uL (ref 4.0–10.5)
nRBC: 0 % (ref 0.0–0.2)

## 2022-07-27 LAB — COMPREHENSIVE METABOLIC PANEL
ALT: 25 U/L (ref 0–44)
AST: 31 U/L (ref 15–41)
Albumin: 4 g/dL (ref 3.5–5.0)
Alkaline Phosphatase: 79 U/L (ref 38–126)
Anion gap: 8 (ref 5–15)
BUN: 14 mg/dL (ref 8–23)
CO2: 24 mmol/L (ref 22–32)
Calcium: 8.7 mg/dL — ABNORMAL LOW (ref 8.9–10.3)
Chloride: 105 mmol/L (ref 98–111)
Creatinine, Ser: 0.77 mg/dL (ref 0.44–1.00)
GFR, Estimated: >60 mL/min (ref >60)
Glucose, Bld: 79 mg/dL (ref 70–99)
Potassium: 3.9 mmol/L (ref 3.5–5.1)
Sodium: 137 mmol/L (ref 135–145)
Total Bilirubin: 0.6 mg/dL (ref 0.3–1.2)
Total Protein: 6.7 g/dL (ref 6.5–8.1)

## 2022-07-27 LAB — VITAMIN B12: Vitamin B-12: 864 pg/mL (ref 180–914)

## 2022-07-27 MED ORDER — HEPARIN SOD (PORK) LOCK FLUSH 100 UNIT/ML IV SOLN
500.0000 [IU] | Freq: Once | INTRAVENOUS | Status: AC
  Administered 2022-07-27: 500 [IU] via INTRAVENOUS
  Filled 2022-07-27: qty 5

## 2022-07-27 MED ORDER — SODIUM CHLORIDE 0.9% FLUSH
10.0000 mL | Freq: Once | INTRAVENOUS | Status: AC
  Administered 2022-07-27: 10 mL via INTRAVENOUS
  Filled 2022-07-27: qty 10

## 2022-07-27 NOTE — Assessment & Plan Note (Addendum)
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  Pathology proved liver metastatic disease- additional chemotherapy --> whipple procedure.--> adjuvant Gem/Abraxane finished in August 2023. ypT1b ypN0 Labs reviewed and discussed with patient -  12/12/ 2023 CT scan at [facility] shows no evidence of cancer recurrence.-NED CA 19.9 remains low.  Continue clinical and imaging surveillance.

## 2022-07-27 NOTE — Assessment & Plan Note (Signed)
status post Whipple procedure..  Continue Creon, continue 24000 units TID with meals and  with her snacks

## 2022-07-27 NOTE — Assessment & Plan Note (Signed)
fingertips and toes, grade 1/2.  She did not tolerate Cymbalta.   Stable symptoms. Acupuncture sessions as needed.Marland Kitchen

## 2022-07-27 NOTE — Assessment & Plan Note (Signed)
Hemoglobin is stable.  Monitor. 

## 2022-07-27 NOTE — Progress Notes (Signed)
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  Pathology proved liver metastatic disease- additional chemotherapy --> whipple procedure.--> adjuvant Gem/Abraxane finished in August 2023. ypT1b ypN0 Labs reviewed and discussed with patient -  12/12/ 2023 CT scan at [facility] shows no evidence of cancer recurrence.-NED CA 19.9 remains low.  Continue clinical and imaging surveillance.  Chemotherapy-induced neuropathy (HCC) fingertips and toes, grade 1/2.  She did not tolerate Cymbalta.   Stable symptoms. Acupuncture sessions as needed..   Anemia due to antineoplastic chemotherapy Hemoglobin is stable. Monitor.   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  Continue calcium supplementation, 2000 mg daily-divided in 2-3 doses   Pancreatic insufficiency  status post Whipple procedure..  Continue Creon, continue 24000 units TID with meals and  with her snacks  Port-A-Cath in place Continue port flush Q8w   Orders Placed This Encounter  Procedures   CBC with Differential/Platelet    Standing Status:   Future    Standing Expiration Date:   07/28/2023   Comprehensive metabolic panel    Standing Status:   Future    Standing Expiration Date:   07/27/2023   Cancer antigen 19-9    Standing Status:   Future    Standing Expiration Date:   07/27/2023    Follow up in 4 months.   All questions were answered. The patient knows to call the clinic with any  problems, questions or concerns.  [name], MD, PhD [facility] Hematology Oncology 07/27/2022   [name], MD    HISTORY OF PRESENTING ILLNESS:   Cassandra [name] is a  67 y.o.  female with presents for follow up of Stage IV pancreatic adenocarcinoma Patient initially presented with jaundice, transaminitis, bilirubin was 9.9.  CA 19-9 was 1874.  Patient also reports unintentional weight loss. 02/08/2020 MRI abdomen and MRCP with and without contrast was done at [facility] which showed pancreatic head mass measuring up to 3 cm, with marked associated narrowing of the portal vein confluence.  SMA is preserved.  Marked intrahepatic and extrahepatic biliary duct dilatation as well as mild dilatation of the main pancreatic duct. Multiple hepatic masses highly concerning for metastatic disease.  Patient underwent EUS on 02/19/2020, which showed irregular mass identified in the pancreatic head, hypoechoic, measured 61mx33mm, sonographic evidence concerning for invasion into the superior mesenteric artery.  There is no sign of significant abnormality in the main pancreatic duct.  Dilatation of common bile duct which measured up to 16 mm.  Region of celiac artery was visualized and showed no signs of significant abnormality.  No lymphadenopathy.  FNA showed adenocarcinoma.  02/19/2020, ERCP, malignant.  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  The stricture was treated with placement of wall flex metal stent.  [facility] ZPia Mauand was recommended for 3 drug regimen FOLFIRINOX.  [facility]  Patient was referred to establish care today. She denies any pain.  Since stent placement, skin jaundice has improved.  Itchiness has also improved. Patient was accompanied by her husband today.  She has a family history of breast cancer in sister and paternal aunt, colon cancer  paternal grandmother.  #No reportable targetable mutation on NGS 9/14/2021cycle 1 FOLFIRINOX.  Patient received oxaliplatin and about 50% of Irinotecan on day 1 and had experienced neurologic symptoms.  She went to ER and working diagnosis is TIA, and eventually I think this is due to irinotecan side effects -irinotecan-associated dysarthria, lip/tongue numbness.  Adjustment was made for Irinotecan to be  infused over 180 minutes.  Atropine 0.5 mg once prior to the irinotecan. No recurrent symptoms.   # 03/19/2020-03/21/2020 patient was admitted due to sepsis with strep pneumonia bacteremia.  Patient was treated with IV Rocephin.  TEE was done which showed no vegetation.  No PFO or ASD.  Patient was discharged home and he finished full course of 14 days of IV Rocephin on 04/02/2020 per ID recommendation.  Repeat blood culture was also negative.  08/25/20 cycle 10 FOLFIRINOX 09/08/20- present  Starting cycle 11, FOLFIRI, Oxaliplatin discontinued due to neuropathy   #NGS showed no reportable targetable mutation #Genetic testing-[employer] diagnostic testing showed no pathological variants identified. MS stable, TMB 71mt/mb, KRAS G12D, SF3B1 K700E, TP53 [id number]  #01/14/2021  [facility].  # [facility] [TDayton Scrapeere 4~5 liver lesions on OSH MRI last year at the time of diagnosis highly suspicious for metastasis. She is not eligible for surgical protocol for metastasis resection given the number of liver metastasis is >3. However given her excellent and durable response to chemo, surgical resection may be considered pending sustained disease control at 1 year and may require partial hepatectomy first to see if there is viable tumor before proceeding to Whipple resection ]  Patient had a COVID-19 infection in September 2022.  03/23/2021 CT abdomen pelvis No significant change in the ill-defined pancreatic head mass or   associated biliary ductal and pancreatic ductal dilation.  Slight interval enlargement of subcentimeter anterior peripancreatic  lymph node, nonspecific. Attention on follow-up per clinical protocol. Similar enlargement of the ascending thoracic aorta measuring 4.4 cm  03/23/2021 MRI abdomen w and wo  Increasing ill-defined hypoenhancement of the pancreatic head surrounding the common bile duct stent, in keeping with known pancreatic malignancy. Anterior peripancreatic lymph node is better evaluated on CT.  Unchanged position of a common bile duct stent with similar mild diffuse intrahepatic ductal dilation and pneumobilia. No evidence of metastatic disease in the abdomen or pelvis. Partially visualized cystic left adnexal lesion measuring up to 3.9 cm.   04/10/2021, [facility]  [facility] Segments 3 partial hepatectomy, negative for viable tumor. Section 5/6 partial hepatectomy part 1 and 2, microscopic foci of residual viable adenocarcinoma, morphologically consistent with history of pancreatic primary.  Parenchymal margin is uninvolved.  #Patient resumed on FOLFIRI on 04/28/2021. She got another cycle on 05/12/2021 #05/26/2021, patient did not get additional chemotherapy due to transaminitis.  Shared decision was made to stop FOLFIRI and switch to gemcitabine Abraxane treatments.  05/26/2021, patient developed transaminitis, AST 763, ALT 691, alkaline phosphatase 364.  Bilirubin 0.7 05/26/2021 stat ultrasound abdomen right upper quadrant showed interval development of 3.3 x 1.8 cm complex mass in the right hepatic lobe.  Increased extrahepatic ductal dilatation.  Concerning for CBD obstruction. 05/29/2021, MRI abdomen MRCP with and without contrast showed unchanged pancreatic head  soft tissue.  Severe intra and extrahepatic biliary ductal dilatation.  Common bile duct stents remain in position however patency is not  established.  No evidence of lymphadenopathy or metastatic disease in the abdomen.  With further communication with radiologist, addendum was added that there is internal fluid signal and no appreciable associated contrast enhancement measuring 3.4 x 2.3 cm.  This appearance is generally not consistent with metastasis and is of uncertain significance.  Possibly reflecting hepatic abscess or residual of subcapsular hematoma.  Patient was seen by [facility] Dr. Alwyn Pea team.  He had ERCP done on 06/05/2021, with findings of One stent from the biliary tree was seen in the major papilla. The stent had migrated significantly into the duodenum. This is the cause of stent malfunction. One stent was removed from the biliary tree. Prior biliary sphincterotomy appeared open. - A single severe biliary stricture was found in the lower third of the main bile duct with upstream dilation. The stricture was alignant appearing.- The biliary tree was swept and sludge was found.- One fully covered metal stent was placed into the common bile duct across the stricture.- No pancreatogram performed.  06/12/2021-08/10/2021 gemcitabine and Abraxane.   10/14/2021, patient underwent Whipple procedure. Liver biopsy negative for malignancy. Review procedure showed invasive adenocarcinoma, moderately differentiated, centered in pancreatic head and confined in the Pancreas. Surgical margin is negative for malignancy.  36 lymph nodes were all negative for malignancy.  1 hepatic artery lymph node was harvested and was negative.  Gallbladder negative for malignancy.  Cystic duct excision negative for malignancy. pT1b pN0  10/25/2021, CT abdomen pelvis with contrast showed rim-enhancing fluid collection in the region of the hepatic hilum. Patient had a readmission for superficial wound infection.  She completed antibiotics on 11/03/2021.  11/03/2021, CT abdomen pelvis with contrast showed near resolution of previously seen fluid collection in the  region of the hepatic hilum.  Similar appearance of the area of hypoenhancement in the liver.  Attention on follow-up.  11/17/2021, resumed on gemcitabine/Abraxane. 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   INTERVAL HISTORY AMARIZ FLAMENCO is a 67 y.o. female who has above history reviewed by me today presents for pancreatic cancer.  She is doing well clinically.  Recent CT scan at [facility] showed no evidence of recurrence. Patient has no new complaints.  Weight has been stable.  She takes calcium supplementation.   Review of Systems  Constitutional:  Negative for appetite change, chills, fatigue, fever and unexpected weight change.  HENT:   Negative for hearing loss and voice change.   Eyes:  Negative for eye problems.  Respiratory:  Negative for chest tightness and cough.   Cardiovascular:  Negative for chest pain.  Gastrointestinal:  Negative for abdominal distention, abdominal pain, blood in stool and nausea.  Endocrine: Negative for hot flashes.  Genitourinary:  Negative for difficulty urinating and frequency.   Musculoskeletal:  Negative for arthralgias.  Skin:  Negative for itching and rash.  Neurological:  Positive for numbness. Negative for extremity weakness.  Hematological:  Negative for adenopathy.  Psychiatric/Behavioral:  Negative for confusion.     MEDICAL HISTORY:  Past Medical History:  Diagnosis Date   Anemia    Cancer ([place])    pancreatic cancer   Colon polyps    Family history of breast cancer    Neuropathy due to drug ([place])    chemo induced   Neutropenia ([place]) 04/18/2020   Osteopenia after menopause 05/2017   femoral neck T score -2.0  Personal history of chemotherapy    current for pancreatic ca   PONV (postoperative nausea and vomiting)    Pure hypercholesterolemia     SURGICAL HISTORY: Past Surgical History:  Procedure Laterality Date   COLONOSCOPY  07/2015   WNL   COLONOSCOPY  2008/2011   COLONOSCOPY WITH PROPOFOL N/A 07/10/2021    Procedure: COLONOSCOPY WITH PROPOFOL;  Surgeon: [name], MD;  Location: [facility] ENDOSCOPY;  Service: Endoscopy;  Laterality: N/A;   IR CV LINE INJECTION  04/28/2021   OVARIAN CYST REMOVAL  1992   dermoid-Dr CAK   PORTA CATH INSERTION N/A 03/07/2020   Procedure: PORTA CATH INSERTION;  Surgeon: Algernon Huxley, MD;  Location: [place] CV LAB;  Service: Cardiovascular;  Laterality: N/A;   TEE WITHOUT CARDIOVERSION N/A 03/21/2020   Procedure: TRANSESOPHAGEAL ECHOCARDIOGRAM (TEE);  Surgeon: Dionisio David, MD;  Location: [facility] ORS;  Service: Cardiovascular;  Laterality: N/A;   TUBAL LIGATION  1993    SOCIAL HISTORY: Social History   Socioeconomic History   Marital status: Married    Spouse name: Not on file   Number of children: 2   Years of education: Not on file   Highest education level: Not on file  Occupational History   Occupation: Pharmacist, hospital    Comment: retired   Occupation: Psychologist, sport and exercise  Tobacco Use   Smoking status: Former    Packs/day: 1.00    Years: 12.00    Total pack years: 12.00    Types: Cigarettes    Quit date: 1987    Years since quitting: 37.1   Smokeless tobacco: Never   Tobacco comments:    Quit smoking 1987  Vaping Use   Vaping Use: Never used  Substance and Sexual Activity   Alcohol use: Not Currently    Alcohol/week: 7.0 standard drinks of alcohol    Types: 7 Glasses of wine per week    Comment: 0-2 mixed drinks a day   Drug use: No   Sexual activity: Not Currently    Birth control/protection: Post-menopausal  Other Topics Concern   Not on file  Social History Narrative   Not on file   Social Determinants of Health   Financial Resource Strain: Low Risk  (10/08/2021)   Overall Financial Resource Strain (CARDIA)    Difficulty of Paying Living Expenses: Not hard at all  Food Insecurity: No Food Insecurity (04/23/2022)   Hunger Vital Sign    Worried About Running Out of Food in the Last Year: Never true    Ran Out of Food in the Last Year: Never  true  Transportation Needs: No Transportation Needs (04/23/2022)   PRAPARE - Hydrologist (Medical): No    Lack of Transportation (Non-Medical): No  Physical Activity: Sufficiently Active (10/08/2021)   Exercise Vital Sign    Days of Exercise per Week: 5 days    Minutes of Exercise per Session: 40 min  Stress: No Stress Concern Present (10/08/2021)   [place]    Feeling of Stress : Not at all  Social Connections: Moderately Isolated (10/08/2021)   Social Connection and Isolation Panel [NHANES]    Frequency of Communication with Friends and Family: More than three times a week    Frequency of Social Gatherings with Friends and Family: Three times a week    Attends Religious Services: Never    Active Member of Clubs or Organizations: No    Attends Archivist Meetings: Never  Marital Status: Married  Human resources officer Violence: Not At Risk (10/08/2021)   Humiliation, Afraid, Rape, and Kick questionnaire    Fear of Current or Ex-Partner: No    Emotionally Abused: No    Physically Abused: No    Sexually Abused: No    FAMILY HISTORY: Family History  Problem Relation Age of Onset   Breast cancer Paternal Aunt 58   Diabetes Mother    Osteoporosis Mother    Hyperlipidemia Father    Rheumatic fever Father    Valvular heart disease Father    Cancer Paternal Grandmother        possible colon   Breast cancer Sister 92    ALLERGIES:  is allergic to penicillin g.  MEDICATIONS:  Current Outpatient Medications  Medication Sig Dispense Refill   Calcium Carbonate-Vit D-Min (CALCIUM 1200 PO) Take by mouth.     Cholecalciferol 25 MCG (1000 UT) tablet Take 2,000 Units by mouth daily.     CREON 24000-76000 units CPEP TAKE 1 CAPSULE THREE TIMES DAILY BEFORE MEALS 200 capsule 3   Cyanocobalamin (B-12 PO) Take by mouth.     lidocaine-prilocaine (EMLA) cream APPLY TO AFFECTED AREA ONCE AS  DIRECTED 30 g 6   Multiple Vitamins-Minerals (MULTIVITAMIN WITH MINERALS) tablet Take 1 tablet by mouth daily.     simethicone (GAS-X) 80 MG chewable tablet Chew 1 tablet (80 mg total) by mouth every 8 (eight) hours as needed for flatulence. 60 tablet 0   clobetasol cream (TEMOVATE) 0.05 %  (Patient not taking: Reported on 07/27/2022)     ondansetron (ZOFRAN-ODT) 4 MG disintegrating tablet Take 4 mg by mouth every 8 (eight) hours as needed. (Patient not taking: Reported on 04/12/2022)     prochlorperazine (COMPAZINE) 10 MG tablet TAKE 1 TABLET (10 MG TOTAL) BY MOUTH EVERY 6 (SIX) HOURS AS NEEDED (NAUSEA OR VOMITING). (Patient not taking: Reported on 04/12/2022)     No current facility-administered medications for this visit.   Facility-Administered Medications Ordered in Other Visits  Medication Dose Route Frequency Provider Last Rate Last Admin   heparin lock flush 100 UNIT/ML injection            prochlorperazine (COMPAZINE) 10 MG tablet            sodium chloride flush (NS) 0.9 % injection 10 mL  10 mL Intravenous PRN [name], MD   10 mL at 02/18/21 0858   sodium chloride flush (NS) 0.9 % injection 10 mL  10 mL Intravenous Once Borders, Vonna Kotyk R, NP       sodium chloride flush (NS) 0.9 % injection 10 mL  10 mL Intravenous Once [name], MD         PHYSICAL EXAMINATION: ECOG PERFORMANCE STATUS: 0 - Asymptomatic Vitals:   07/27/22 1021  BP: 127/82  Pulse: (!) 52  Temp: (!) 97.3 F (36.3 C)  SpO2: 100%   Filed Weights   07/27/22 1021  Weight: 138 lb 1.6 oz (62.6 kg)    Physical Exam Constitutional:      General: She is not in acute distress. HENT:     Head: Normocephalic and atraumatic.  Eyes:     General: No scleral icterus. Cardiovascular:     Rate and Rhythm: Normal rate and regular rhythm.     Heart sounds: Normal heart sounds.  Pulmonary:     Effort: Pulmonary effort is normal. No respiratory distress.     Breath sounds: No wheezing.  Abdominal:     General: Bowel  sounds are  normal. There is no distension.     Palpations: Abdomen is soft.  Musculoskeletal:        General: No deformity. Normal range of motion.     Cervical back: Normal range of motion and neck supple.  Skin:    General: Skin is warm and dry.     Coloration: Skin is not jaundiced.     Comments: Left basal cell carcinoma status post Mohs surgery  Neurological:     Mental Status: She is alert and oriented to person, place, and time. Mental status is at baseline.     Cranial Nerves: No cranial nerve deficit.     Coordination: Coordination normal.  Psychiatric:        Mood and Affect: Mood normal.     LABORATORY DATA:  I have reviewed the data as listed    Latest Ref Rng & Units 07/27/2022   10:12 AM 04/05/2022    9:20 AM 02/09/2022    8:11 AM  CBC  WBC 4.0 - 10.5 K/uL 4.3  4.9  3.4   Hemoglobin 12.0 - 15.0 g/dL 13.3  13.6  11.3   Hematocrit 36.0 - 46.0 % 38.4  38.9  34.0   Platelets 150 - 400 K/uL 179  184  222       Latest Ref Rng & Units 07/27/2022   10:12 AM 04/05/2022    9:20 AM 02/09/2022    8:11 AM  CMP  Glucose 70 - 99 mg/dL 79  100  100   BUN 8 - 23 mg/dL '14  14  16   '$ Creatinine 0.44 - 1.00 mg/dL 0.77  0.64  0.66   Sodium 135 - 145 mmol/L 137  137  141   Potassium 3.5 - 5.1 mmol/L 3.9  4.1  4.4   Chloride 98 - 111 mmol/L 105  107  110   CO2 22 - 32 mmol/L '24  26  25   '$ Calcium 8.9 - 10.3 mg/dL 8.7  8.5  8.7   Total Protein 6.5 - 8.1 g/dL 6.7  6.6  6.5   Total Bilirubin 0.3 - 1.2 mg/dL 0.6  0.7  0.4   Alkaline Phos 38 - 126 U/L 79  86  82   AST 15 - 41 U/L 31  34  33   ALT 0 - 44 U/L '25  30  26      '$ RADIOGRAPHIC STUDIES: I have personally reviewed the radiological images as listed and agreed with the findings in the report. Reviewed findings of MRI abdomen MRCP done at [facility]. No results found.

## 2022-07-27 NOTE — Assessment & Plan Note (Addendum)
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  Continue calcium supplementation, 2000 mg daily-divided in 2-3 doses

## 2022-07-27 NOTE — Assessment & Plan Note (Signed)
Continue port flush Q8w

## 2022-07-29 ENCOUNTER — Encounter: Payer: Self-pay | Admitting: Family Medicine

## 2022-07-29 LAB — CANCER ANTIGEN 19-9: CA 19-9: 6 U/mL (ref 0–35)

## 2022-07-30 NOTE — Progress Notes (Unsigned)
I,Zenita Kister S Bob Eastwood,acting as a scribe for Lavon Paganini, MD.,have documented all relevant documentation on the behalf of Lavon Paganini, MD,as directed by  Lavon Paganini, MD while in the presence of Lavon Paganini, MD.     Established patient visit   Patient: Cassandra [name]   DOB: [birth year]   66 y.o. Female  MRN: [id number] Visit Date: 08/02/2022  Today's healthcare provider: Lavon Paganini, MD   Chief Complaint  Patient presents with   Follow-up   Subjective    HPI  Lipid/Cholesterol, Follow-up  Last lipid panel Other pertinent labs  Lab Results  Component Value Date   CHOL 187 03/12/2020   HDL 51 03/12/2020   LDLCALC 121 (H) 03/12/2020   TRIG 77 03/12/2020   CHOLHDL 3.7 03/12/2020   Lab Results  Component Value Date   ALT 25 07/27/2022   AST 31 07/27/2022   PLT 179 07/27/2022   TSH 3.400 11/12/2019     She was last seen for this 2 years ago.  Management since that visit includes no changes. Patient requesting lab.  The 10-year ASCVD risk score (Arnett DK, et al., 2019) is: 5.5%  --------------------------------------------------------------------------------------------------- Patient requesting lab to check vitamin D. She reports taking 3000 units daily.  Medications: Outpatient Medications Prior to Visit  Medication Sig   Calcium Carbonate-Vit D-Min (CALCIUM 1200 PO) Take by mouth.   Cholecalciferol 25 MCG (1000 UT) tablet Take 2,000 Units by mouth daily.   clobetasol cream (TEMOVATE) 0.05 %    CREON 24000-76000 units CPEP TAKE 1 CAPSULE THREE TIMES DAILY BEFORE MEALS   Cyanocobalamin (B-12 PO) Take by mouth.   lidocaine-prilocaine (EMLA) cream APPLY TO AFFECTED AREA ONCE AS DIRECTED   Multiple Vitamins-Minerals (MULTIVITAMIN WITH MINERALS) tablet Take 1 tablet by mouth daily.   ondansetron (ZOFRAN-ODT) 4 MG disintegrating tablet Take 4 mg by mouth every 8 (eight) hours as needed.   prochlorperazine (COMPAZINE) 10 MG tablet    simethicone  (GAS-X) 80 MG chewable tablet Chew 1 tablet (80 mg total) by mouth every 8 (eight) hours as needed for flatulence.   Facility-Administered Medications Prior to Visit  Medication Dose Route Frequency Provider   heparin lock flush 100 UNIT/ML injection        prochlorperazine (COMPAZINE) 10 MG tablet        sodium chloride flush (NS) 0.9 % injection 10 mL  10 mL Intravenous PRN [name], MD   sodium chloride flush (NS) 0.9 % injection 10 mL  10 mL Intravenous Once Borders, Vonna Kotyk R, NP   sodium chloride flush (NS) 0.9 % injection 10 mL  10 mL Intravenous Once [name], MD    Review of Systems  Neurological:  Positive for numbness.  All other systems reviewed and are negative.      Objective    BP 120/86 (BP Location: Left Arm, Patient Position: Sitting, Cuff Size: Normal)   Pulse (!) 56   Temp 97.8 F (36.6 C) (Temporal)   Resp 16   Ht '5\' 7"'$  (1.702 m)   Wt 139 lb 8 oz (63.3 kg)   SpO2 98%   BMI 21.85 kg/m    Physical Exam Vitals reviewed.  Constitutional:      General: She is not in acute distress.    Appearance: Normal appearance. She is well-developed. She is not diaphoretic.  HENT:     Head: Normocephalic and atraumatic.  Eyes:     General: No scleral icterus.    Conjunctiva/sclera: Conjunctivae normal.  Neck:  Thyroid: No thyromegaly.  Cardiovascular:     Rate and Rhythm: Normal rate and regular rhythm.     Pulses: Normal pulses.     Heart sounds: Normal heart sounds. No murmur heard. Pulmonary:     Effort: Pulmonary effort is normal. No respiratory distress.     Breath sounds: Normal breath sounds. No wheezing, rhonchi or rales.  Musculoskeletal:     Cervical back: Neck supple.     Right lower leg: No edema.     Left lower leg: No edema.  Lymphadenopathy:     Cervical: No cervical adenopathy.  Skin:    General: Skin is warm and dry.     Findings: No rash.  Neurological:     Mental Status: She is alert and oriented to person, place, and time. Mental  status is at baseline.  Psychiatric:        Mood and Affect: Mood normal.        Behavior: Behavior normal.       No results found for any visits on 08/02/22.  Assessment & Plan     Problem List Items Addressed This Visit       Musculoskeletal and Integument   Osteopenia after menopause - Primary   Relevant Orders   VITAMIN D 25 Hydroxy (Vit-D Deficiency, Fractures)     Other   HLD (hyperlipidemia)   Relevant Orders   Lipid Panel With LDL/HDL Ratio   Other Visit Diagnoses     Avitaminosis D       Relevant Orders   VITAMIN D 25 Hydroxy (Vit-D Deficiency, Fractures)   Need for pneumococcal 20-valent conjugate vaccination       Relevant Orders   Pneumococcal conjugate vaccine 20-valent (Completed)        Return in about 1 year (around 08/03/2023) for chronic disease f/u.      I, Lavon Paganini, MD, have reviewed all documentation for this visit. The documentation on 08/02/22 for the exam, diagnosis, procedures, and orders are all accurate and complete.   Bacigalupo, [name], MD, MPH [facility]

## 2022-08-02 ENCOUNTER — Ambulatory Visit (INDEPENDENT_AMBULATORY_CARE_PROVIDER_SITE_OTHER): Payer: Medicare Other | Admitting: Family Medicine

## 2022-08-02 ENCOUNTER — Encounter: Payer: Self-pay | Admitting: Family Medicine

## 2022-08-02 VITALS — BP 120/86 | HR 56 | Temp 97.8°F | Resp 16 | Ht 67.0 in | Wt 139.5 lb

## 2022-08-02 DIAGNOSIS — Z23 Encounter for immunization: Secondary | ICD-10-CM | POA: Diagnosis not present

## 2022-08-02 DIAGNOSIS — E785 Hyperlipidemia, unspecified: Secondary | ICD-10-CM

## 2022-08-02 DIAGNOSIS — Z780 Asymptomatic menopausal state: Secondary | ICD-10-CM

## 2022-08-02 DIAGNOSIS — E559 Vitamin D deficiency, unspecified: Secondary | ICD-10-CM

## 2022-08-02 DIAGNOSIS — M8580 Other specified disorders of bone density and structure, unspecified site: Secondary | ICD-10-CM

## 2022-08-02 NOTE — Assessment & Plan Note (Signed)
Reviewed last lipid panel Not currently on a statin Recheck FLP and CMP Discussed diet and exercise  

## 2022-08-02 NOTE — Assessment & Plan Note (Signed)
Repeat DEXA next year Recheck Vit D level today Continue Ca and Vit D supplements

## 2022-08-04 LAB — LIPID PANEL WITH LDL/HDL RATIO
Cholesterol, Total: 191 mg/dL (ref 100–199)
HDL: 69 mg/dL (ref >39)
LDL Chol Calc (NIH): 107 mg/dL — ABNORMAL HIGH (ref 0–99)
LDL/HDL Ratio: 1.6 ratio (ref 0.0–3.2)
Triglycerides: 81 mg/dL (ref 0–149)
VLDL Cholesterol Cal: 15 mg/dL (ref 5–40)

## 2022-08-04 LAB — VITAMIN D 25 HYDROXY (VIT D DEFICIENCY, FRACTURES): Vit D, 25-Hydroxy: 26 ng/mL — ABNORMAL LOW (ref 30.0–100.0)

## 2022-08-13 ENCOUNTER — Encounter: Payer: Self-pay | Admitting: Physician Assistant

## 2022-08-13 ENCOUNTER — Ambulatory Visit (INDEPENDENT_AMBULATORY_CARE_PROVIDER_SITE_OTHER): Payer: Medicare Other | Admitting: Physician Assistant

## 2022-08-13 VITALS — BP 136/86 | HR 69 | Temp 97.8°F | Resp 16 | Wt 139.9 lb

## 2022-08-13 DIAGNOSIS — H1033 Unspecified acute conjunctivitis, bilateral: Secondary | ICD-10-CM

## 2022-08-13 DIAGNOSIS — J069 Acute upper respiratory infection, unspecified: Secondary | ICD-10-CM | POA: Diagnosis not present

## 2022-08-13 LAB — POC COVID19 BINAXNOW: SARS Coronavirus 2 Ag: NEGATIVE

## 2022-08-13 MED ORDER — POLYMYXIN B-TRIMETHOPRIM 10000-0.1 UNIT/ML-% OP SOLN: 2.0000 [drp] | OPHTHALMIC | 0 refills | Status: AC

## 2022-08-13 NOTE — Progress Notes (Signed)
I,Sulibeya S Dimas,acting as a Education administrator for [employer], PA-C.,have documented all relevant documentation on the behalf of Mikey Kirschner, PA-C,as directed by  Mikey Kirschner, PA-C while in the presence of Mikey Kirschner, PA-C.     Established patient visit   Patient: Cassandra [name]   DOB: 09/22/1955   67 y.o. Female  MRN: [id number] Visit Date: 08/13/2022  Today's healthcare provider: Mikey Kirschner, PA-C   Chief Complaint  Patient presents with   URI   Subjective    HPI  Upper respiratory symptoms She complains of congestion, post nasal drip, productive cough with  green colored sputum, purulent nasal discharge, sinus pressure, sore throat, and eye discharge .with no fever, chills, night sweats or weight loss. Onset of symptoms was  5 days ago and gradually improving.She is drinking plenty of fluids.  Past history is significant for no history of pneumonia or bronchitis. Patient is non-smoker. Taking OTC Sudafed.   ---------------------------------------------------------------------------------------------------   Medications: Outpatient Medications Prior to Visit  Medication Sig   Calcium Carbonate-Vit D-Min (CALCIUM 1200 PO) Take by mouth.   Cholecalciferol 25 MCG (1000 UT) tablet Take 2,000 Units by mouth daily.   clobetasol cream (TEMOVATE) 0.05 %    CREON 24000-76000 units CPEP TAKE 1 CAPSULE THREE TIMES DAILY BEFORE MEALS   Cyanocobalamin (B-12 PO) Take by mouth.   lidocaine-prilocaine (EMLA) cream APPLY TO AFFECTED AREA ONCE AS DIRECTED   Multiple Vitamins-Minerals (MULTIVITAMIN WITH MINERALS) tablet Take 1 tablet by mouth daily.   ondansetron (ZOFRAN-ODT) 4 MG disintegrating tablet Take 4 mg by mouth every 8 (eight) hours as needed.   prochlorperazine (COMPAZINE) 10 MG tablet    simethicone (GAS-X) 80 MG chewable tablet Chew 1 tablet (80 mg total) by mouth every 8 (eight) hours as needed for flatulence.   Facility-Administered Medications Prior to Visit  Medication  Dose Route Frequency Provider   heparin lock flush 100 UNIT/ML injection        prochlorperazine (COMPAZINE) 10 MG tablet        sodium chloride flush (NS) 0.9 % injection 10 mL  10 mL Intravenous PRN [name], MD   sodium chloride flush (NS) 0.9 % injection 10 mL  10 mL Intravenous Once Borders, Vonna Kotyk R, NP   sodium chloride flush (NS) 0.9 % injection 10 mL  10 mL Intravenous Once [name], MD    Review of Systems  Constitutional:  Negative for chills and fever.  HENT:  Positive for congestion, postnasal drip, sinus pain and sore throat. Negative for ear pain.   Eyes:  Positive for discharge and redness. Negative for pain and itching.  Respiratory:  Positive for cough and chest tightness. Negative for shortness of breath and wheezing.       Objective    BP 136/86 (BP Location: Right Arm, Patient Position: Sitting, Cuff Size: Normal)   Pulse 69   Temp 97.8 F (36.6 C) (Temporal)   Resp 16   Wt 139 lb 14.4 oz (63.5 kg)   SpO2 99%   BMI 21.91 kg/m   Physical Exam Constitutional:      General: She is awake.     Appearance: She is well-developed.  HENT:     Head: Normocephalic.     Right Ear: Tympanic membrane normal.     Left Ear: Tympanic membrane normal.     Mouth/Throat:     Pharynx: Posterior oropharyngeal erythema present. No oropharyngeal exudate.  Eyes:     Conjunctiva/sclera:     Right eye: Right conjunctiva  is injected.     Left eye: Left conjunctiva is injected.     Comments: B/l injected conjunctivia with yellow discharge  Cardiovascular:     Rate and Rhythm: Normal rate and regular rhythm.     Heart sounds: Normal heart sounds.  Pulmonary:     Effort: Pulmonary effort is normal.     Breath sounds: Normal breath sounds. No wheezing, rhonchi or rales.  Skin:    General: Skin is warm.  Neurological:     Mental Status: She is alert and oriented to person, place, and time.  Psychiatric:        Attention and Perception: Attention normal.        Mood and  Affect: Mood normal.        Speech: Speech normal.        Behavior: Behavior is cooperative.     Results for orders placed or performed in visit on 08/13/22  POC COVID-19  Result Value Ref Range   SARS Coronavirus 2 Ag Negative Negative    Assessment & Plan     URI 2. Bilateral conjunctivitis Poc covid neg Increase fluids Recommend mucinex otc  Rx polytrim drops for eyes x 5-7 days  If symptoms are not improved by day 7-10 please reach out to office  Return if symptoms worsen or fail to improve.      I, Mikey Kirschner, PA-C have reviewed all documentation for this visit. The documentation on  08/13/22 for the exam, diagnosis, procedures, and orders are all accurate and complete.  Mikey Kirschner, PA-C [facility] [address] [place], [place], [address] Office: [phone] Fax: [place]

## 2022-09-21 ENCOUNTER — Inpatient Hospital Stay: Payer: Medicare Other

## 2022-10-07 ENCOUNTER — Encounter: Payer: Self-pay | Admitting: Oncology

## 2022-10-07 ENCOUNTER — Other Ambulatory Visit: Payer: Self-pay

## 2022-10-07 MED ORDER — CREON 24000-76000 UNITS PO CPEP: ORAL_CAPSULE | ORAL | 3 refills | Status: AC

## 2022-10-13 ENCOUNTER — Ambulatory Visit (INDEPENDENT_AMBULATORY_CARE_PROVIDER_SITE_OTHER): Payer: Medicare Other

## 2022-10-13 VITALS — Ht 67.0 in | Wt 139.0 lb

## 2022-10-13 DIAGNOSIS — Z0000 Encounter for general adult medical examination without abnormal findings: Secondary | ICD-10-CM | POA: Diagnosis not present

## 2022-10-13 DIAGNOSIS — Z1231 Encounter for screening mammogram for malignant neoplasm of breast: Secondary | ICD-10-CM

## 2022-10-13 NOTE — Patient Instructions (Signed)
Cassandra [name] , Thank you for taking time to come for your Medicare Wellness Visit. I appreciate your ongoing commitment to your health goals. Please review the following plan we discussed and let me know if I can assist you in the future.   These are the goals we discussed:  Goals      DIET - EAT MORE FRUITS AND VEGETABLES        This is a list of the screening recommended for you and due dates:  Health Maintenance  Topic Date Due   Zoster (Shingles) Vaccine (1 of 2) Never done   COVID-19 Vaccine (5 - 2023-24 season) 02/26/2022   Flu Shot  01/27/2023   Medicare Annual Wellness Visit  10/13/2023   Mammogram  01/13/2024   DTaP/Tdap/Td vaccine (3 - Td or Tdap) 05/13/2027   Colon Cancer Screening  07/11/2031   Pneumonia Vaccine  Completed   DEXA scan (bone density measurement)  Completed   Hepatitis C Screening: USPSTF Recommendation to screen - Ages 105-79 yo.  Completed   HPV Vaccine  Aged Out    Advanced directives: yes  Conditions/risks identified: none  Next appointment: Follow up in one year for your annual wellness visit 10/18/2023 @ 9:15am telephone   Preventive Care 65 Years and Older, Female Preventive care refers to lifestyle choices and visits with your health care provider that can promote health and wellness. What does preventive care include? A yearly physical exam. This is also called an annual well check. Dental exams once or twice a year. Routine eye exams. Ask your health care provider how often you should have your eyes checked. Personal lifestyle choices, including: Daily care of your teeth and gums. Regular physical activity. Eating a healthy diet. Avoiding tobacco and drug use. Limiting alcohol use. Practicing safe sex. Taking low-dose aspirin every day. Taking vitamin and mineral supplements as recommended by your health care provider. What happens during an annual well check? The services and screenings done by your health care provider during your  annual well check will depend on your age, overall health, lifestyle risk factors, and family history of disease. Counseling  Your health care provider may ask you questions about your: Alcohol use. Tobacco use. Drug use. Emotional well-being. Home and relationship well-being. Sexual activity. Eating habits. History of falls. Memory and ability to understand (cognition). Work and work Astronomer. Reproductive health. Screening  You may have the following tests or measurements: Height, weight, and BMI. Blood pressure. Lipid and cholesterol levels. These may be checked every 5 years, or more frequently if you are over 28 years old. Skin check. Lung cancer screening. You may have this screening every year starting at age 33 if you have a 30-pack-year history of smoking and currently smoke or have quit within the past 15 years. Fecal occult blood test (FOBT) of the stool. You may have this test every year starting at age 31. Flexible sigmoidoscopy or colonoscopy. You may have a sigmoidoscopy every 5 years or a colonoscopy every 10 years starting at age 83. Hepatitis C blood test. Hepatitis B blood test. Sexually transmitted disease (STD) testing. Diabetes screening. This is done by checking your blood sugar (glucose) after you have not eaten for a while (fasting). You may have this done every 1-3 years. Bone density scan. This is done to screen for osteoporosis. You may have this done starting at age 62. Mammogram. This may be done every 1-2 years. Talk to your health care provider about how often you should have regular  mammograms. Talk with your health care provider about your test results, treatment options, and if necessary, the need for more tests. Vaccines  Your health care provider may recommend certain vaccines, such as: Influenza vaccine. This is recommended every year. Tetanus, diphtheria, and acellular pertussis (Tdap, Td) vaccine. You may need a Td booster every 10  years. Zoster vaccine. You may need this after age 18. Pneumococcal 13-valent conjugate (PCV13) vaccine. One dose is recommended after age 48. Pneumococcal polysaccharide (PPSV23) vaccine. One dose is recommended after age 52. Talk to your health care provider about which screenings and vaccines you need and how often you need them. This information is not intended to replace advice given to you by your health care provider. Make sure you discuss any questions you have with your health care provider. Document Released: 07/11/2015 Document Revised: 03/03/2016 Document Reviewed: 04/15/2015 Elsevier Interactive Patient Education  2017 [place] Prevention in the Home Falls can cause injuries. They can happen to people of all ages. There are many things you can do to make your home safe and to help prevent falls. What can I do on the outside of my home? Regularly fix the edges of walkways and driveways and fix any cracks. Remove anything that might make you trip as you walk through a door, such as a raised step or threshold. Trim any bushes or trees on the path to your home. Use bright outdoor lighting. Clear any walking paths of anything that might make someone trip, such as rocks or tools. Regularly check to see if handrails are loose or broken. Make sure that both sides of any steps have handrails. Any raised decks and porches should have guardrails on the edges. Have any leaves, snow, or ice cleared regularly. Use sand or salt on walking paths during winter. Clean up any spills in your garage right away. This includes oil or grease spills. What can I do in the bathroom? Use night lights. Install grab bars by the toilet and in the tub and shower. Do not use towel bars as grab bars. Use non-skid mats or decals in the tub or shower. If you need to sit down in the shower, use a plastic, non-slip stool. Keep the floor dry. Clean up any water that spills on the floor as soon as it  happens. Remove soap buildup in the tub or shower regularly. Attach bath mats securely with double-sided non-slip rug tape. Do not have throw rugs and other things on the floor that can make you trip. What can I do in the bedroom? Use night lights. Make sure that you have a light by your bed that is easy to reach. Do not use any sheets or blankets that are too big for your bed. They should not hang down onto the floor. Have a firm chair that has side arms. You can use this for support while you get dressed. Do not have throw rugs and other things on the floor that can make you trip. What can I do in the kitchen? Clean up any spills right away. Avoid walking on wet floors. Keep items that you use a lot in easy-to-reach places. If you need to reach something above you, use a strong step stool that has a grab bar. Keep electrical cords out of the way. Do not use floor polish or wax that makes floors slippery. If you must use wax, use non-skid floor wax. Do not have throw rugs and other things on the floor that can  make you trip. What can I do with my stairs? Do not leave any items on the stairs. Make sure that there are handrails on both sides of the stairs and use them. Fix handrails that are broken or loose. Make sure that handrails are as long as the stairways. Check any carpeting to make sure that it is firmly attached to the stairs. Fix any carpet that is loose or worn. Avoid having throw rugs at the top or bottom of the stairs. If you do have throw rugs, attach them to the floor with carpet tape. Make sure that you have a light switch at the top of the stairs and the bottom of the stairs. If you do not have them, ask someone to add them for you. What else can I do to help prevent falls? Wear shoes that: Do not have high heels. Have rubber bottoms. Are comfortable and fit you well. Are closed at the toe. Do not wear sandals. If you use a stepladder: Make sure that it is fully opened.  Do not climb a closed stepladder. Make sure that both sides of the stepladder are locked into place. Ask someone to hold it for you, if possible. Clearly mark and make sure that you can see: Any grab bars or handrails. First and last steps. Where the edge of each step is. Use tools that help you move around (mobility aids) if they are needed. These include: Canes. Walkers. Scooters. Crutches. Turn on the lights when you go into a dark area. Replace any light bulbs as soon as they burn out. Set up your furniture so you have a clear path. Avoid moving your furniture around. If any of your floors are uneven, fix them. If there are any pets around you, be aware of where they are. Review your medicines with your doctor. Some medicines can make you feel dizzy. This can increase your chance of falling. Ask your doctor what other things that you can do to help prevent falls. This information is not intended to replace advice given to you by your health care provider. Make sure you discuss any questions you have with your health care provider. Document Released: 04/10/2009 Document Revised: 11/20/2015 Document Reviewed: 07/19/2014 Elsevier Interactive Patient Education  2017 [employer].

## 2022-10-13 NOTE — Progress Notes (Cosign Needed Addendum)
I connected with  Cassandra [name] on 10/13/22 by a audio enabled telemedicine application and verified that I am speaking with the correct person using two identifiers.  Patient Location: Home  Provider Location: Office/Clinic  I discussed the limitations of evaluation and management by telemedicine. The patient expressed understanding and agreed to proceed.  Subjective:   Cassandra [name] is a 67 y.o. female who presents for Medicare Annual (Subsequent) preventive examination.  Review of Systems    Cardiac Risk Factors include: advanced age (>42men, >30 women);dyslipidemia    Objective:    Today's Vitals   10/13/22 0911  Weight: 139 lb (63 kg)  Height:  (1.702 m)   Body mass index is 21.77 kg/m.     10/13/2022    9:18 AM 04/12/2022   10:29 AM 02/02/2022    8:38 AM 01/19/2022    8:30 AM 01/05/2022    8:35 AM 12/21/2021   11:00 AM 12/21/2021    9:00 AM  Advanced Directives  Does Patient Have a Medical Advance Directive? Yes Yes Yes Yes Yes Yes Yes  Type of Advance Directive  Living will;Healthcare Power of Attorney  Living will;Healthcare Power of Attorney     Does patient want to make changes to medical advance directive?      No - Patient declined No - Patient declined  Copy of Healthcare Power of Attorney in Chart?  No - copy requested  No - copy requested     Would patient like information on creating a medical advance directive?  No - Patient declined  No - Patient declined       Current Medications (verified) Outpatient Encounter Medications as of 10/13/2022  Medication Sig   Calcium Carbonate-Vit D-Min (CALCIUM 1200 PO) Take by mouth.   Cholecalciferol 25 MCG (1000 UT) tablet Take 2,000 Units by mouth daily.   clobetasol cream (TEMOVATE) 0.05 %    Cyanocobalamin (B-12 PO) Take by mouth.   lidocaine-prilocaine (EMLA) cream APPLY TO AFFECTED AREA ONCE AS DIRECTED   Multiple Vitamins-Minerals (MULTIVITAMIN WITH MINERALS) tablet Take 1 tablet by mouth daily.   ondansetron  (ZOFRAN-ODT) 4 MG disintegrating tablet Take 4 mg by mouth every 8 (eight) hours as needed.   Pancrelipase, Lip-Prot-Amyl, (CREON) 24000-76000 units CPEP TAKE 1 CAPSULE THREE TIMES DAILY BEFORE MEALS   prochlorperazine (COMPAZINE) 10 MG tablet    simethicone (GAS-X) 80 MG chewable tablet Chew 1 tablet (80 mg total) by mouth every 8 (eight) hours as needed for flatulence.   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   Neutropenia 04/18/2020   Osteopenia after menopause 05/2017   femoral neck T score -2.0   Personal history of chemotherapy    current for pancreatic ca   PONV (postoperative nausea and vomiting)    Pure hypercholesterolemia    Past Surgical History:  Procedure Laterality Date   COLONOSCOPY  07/2015   WNL  COLONOSCOPY  2008/2011   COLONOSCOPY WITH PROPOFOL N/A 07/10/2021   Procedure: COLONOSCOPY WITH PROPOFOL;  Surgeon: Regis Bill, MD;  Location: [facility] ENDOSCOPY;  Service: Endoscopy;  Laterality: N/A;   IR CV LINE INJECTION  04/28/2021   OVARIAN CYST REMOVAL  1992   dermoid-Dr CAK   PORTA CATH INSERTION N/A 03/07/2020   Procedure: PORTA CATH INSERTION;  Surgeon: Annice Needy, MD;  Location: [facility] INVASIVE CV LAB;  Service: Cardiovascular;  Laterality: N/A;   TEE WITHOUT CARDIOVERSION N/A 03/21/2020   Procedure: TRANSESOPHAGEAL ECHOCARDIOGRAM (TEE);  Surgeon: Laurier Nancy, MD;  Location: [facility] ORS;  Service: Cardiovascular;  Laterality: N/A;   TUBAL  LIGATION  1993   Family History  Problem Relation Age of Onset   Breast cancer Paternal Aunt 51   Diabetes Mother    Osteoporosis Mother    Hyperlipidemia Father    Rheumatic fever Father    Valvular heart disease Father    Cancer Paternal Grandmother        possible colon   Breast cancer Sister 30   Social History   Socioeconomic History   Marital status: Married    Spouse name: Not on file   Number of children: 2   Years of education: Not on file   Highest education level: Not on file  Occupational History   Occupation: Teacher    Comment: retired   Occupation: Visual merchandiser  Tobacco Use   Smoking status: Former    Packs/day: 1.00    Years: 10.00    Additional pack years: 0.00    Total pack years: 10.00    Types: Cigarettes    Quit date: 06/28/1985    Years since quitting: 37.3   Smokeless tobacco: Never   Tobacco comments:    Quit smoking 1987  Vaping Use   Vaping Use: Never used  Substance and Sexual Activity   Alcohol use: Yes    Alcohol/week: 5.0 standard drinks of alcohol    Types: 5 Glasses of wine per week    Comment: 0-2 mixed drinks a day   Drug use: No   Sexual activity: Not Currently    Birth control/protection: Post-menopausal  Other Topics Concern   Not on file  Social History Narrative   Not on file   Social Determinants of Health   Financial Resource Strain: Low Risk  (10/09/2022)   Overall Financial Resource Strain (CARDIA)    Difficulty of Paying Living Expenses: Not hard at all  Food Insecurity: No Food Insecurity (10/09/2022)   Hunger Vital Sign    Worried About Running Out of Food in the Last Year: Never true    Ran Out of Food in the Last Year: Never true  Transportation Needs: No Transportation Needs (10/09/2022)   PRAPARE - Administrator, Civil Service (Medical): No    Lack of Transportation (Non-Medical): No  Physical Activity: Insufficiently Active (10/09/2022)   Exercise Vital Sign    Days of Exercise per Week: 4 days     Minutes of Exercise per Session: 30 min  Stress: No Stress Concern Present (10/09/2022)   [employer] of Occupational Health - Occupational Stress Questionnaire    Feeling of Stress : Not at all  Social Connections: Unknown (10/09/2022)   Social Connection and Isolation Panel [NHANES]    Frequency of Communication with Friends and Family: More than three times a week    Frequency of Social Gatherings with Friends and Family: More than three times a week  Attends Religious Services: Not on file    Active Member of Clubs or Organizations: No    Attends Banker Meetings: Never    Marital Status: Married    Tobacco Counseling Counseling given: Not Answered Tobacco comments: Quit smoking 1987   Clinical Intake:  Pre-visit preparation completed: Yes  Pain : No/denies pain   BMI - recorded: 21.77 Nutritional Status: BMI of 19-24  Normal Nutritional Risks: None Diabetes: No  How often do you need to have someone help you when you read instructions, pamphlets, or other written materials from your doctor or pharmacy?: 1 - Never  Diabetic?no  Interpreter Needed?: No  Information entered by :: B.Dameisha Tschida,LPN  Activities of Daily Living    10/09/2022    9:29 AM 08/13/2022    3:15 PM  In your present state of health, do you have any difficulty performing the following activities:  Hearing? 0 0  Vision? 0 0  Difficulty concentrating or making decisions? 0 0  Walking or climbing stairs? 0 0  Dressing or bathing? 0 0  Doing errands, shopping? 0 0  Preparing Food and eating ? N   Using the Toilet? N   In the past six months, have you accidently leaked urine? N   Do you have problems with loss of bowel control? N   Managing your Medications? N   Managing your Finances? N   Housekeeping or managing your Housekeeping? N     Patient Care Team: Erasmo Downer, MD as PCP - General (Family Medicine) Flinchum, Eula Fried, FNP (Family Medicine) Benita Gutter, RN as Oncology Nurse Navigator [name], MD as Consulting Physician (Oncology) Kemper Durie, RN as [facility] Care Management  Indicate any recent Medical Services you may have received from other than [facility] providers in the past year (date may be approximate).     Assessment:   This is a routine wellness examination for [employer].  Hearing/Vision screen Hearing Screening - Comments:: Adequate hearing Vision Screening - Comments:: Adequate vision w/glasses [facility]  Dietary issues and exercise activities discussed: Current Exercise Habits: Home exercise routine, Type of exercise: strength training/weights;stretching;walking, Time (Minutes): 30, Frequency (Times/Week): 4, Weekly Exercise (Minutes/Week): 120, Intensity: Mild, Exercise limited by: None identified   Goals Addressed             This Visit's Progress    DIET - EAT MORE FRUITS AND VEGETABLES   On track      Depression Screen    10/13/2022    9:17 AM 08/13/2022    3:15 PM 10/08/2021    9:49 AM 09/10/2021    9:34 AM 11/01/2019    8:39 AM  PHQ 2/9 Scores  PHQ - 2 Score 0 0 0 0 0  PHQ- 9 Score  0   0    Fall Risk    10/09/2022    9:29 AM 08/13/2022    3:15 PM 10/08/2021    9:52 AM 10/06/2021    8:18 AM 09/10/2021    9:33 AM  Fall Risk   Falls in the past year? 0 0 0 0 0  Number falls in past yr: 0 0 0 0 0  Injury with Fall? 0 0 0 0 0  Risk for fall due to :  No Fall Risks No Fall Risks    Follow up  Falls evaluation completed Falls evaluation completed      FALL RISK PREVENTION PERTAINING TO THE HOME:  Any stairs in or around the home?  Yes  If so, are there any without handrails? Yes  Home free of loose throw rugs in walkways, pet beds, electrical cords, etc? Yes  Adequate lighting in your home to reduce risk of falls? Yes   ASSISTIVE DEVICES UTILIZED TO PREVENT FALLS:  Life alert? Yes  Use of a cane, walker or w/c? No  Grab bars in the bathroom? No  Shower chair or bench in  shower? Yes  Elevated toilet seat or a handicapped toilet? No   Cognitive Function:        10/13/2022    9:18 AM  6CIT Screen  What Year? 0 points  What month? 0 points  What time? 0 points  Count back from 20 0 points  Months in reverse 0 points  Repeat phrase 0 points  Total Score 0 points    Immunizations Immunization History  Administered Date(s) Administered   Covid-19, Mrna,Vaccine(Spikevax)7yrs and older 06/29/2021   Fluad Quad(high Dose 65+) 03/24/2022   Influenza,inj,Quad PF,6+ Mos 04/16/2020   Influenza,inj,quad, With Preservative 04/15/2017   Influenza-Unspecified 04/15/2016, 04/05/2018, 04/10/2019   Moderna Sars-Covid-2 Vaccination 09/01/2019, 09/29/2019, 03/02/2020   PNEUMOCOCCAL CONJUGATE-20 08/02/2022   Tdap 09/01/2010, 05/12/2017    TDAP status: Up to date  Flu Vaccine status: Up to date  Pneumococcal vaccine status: Up to date  Covid-19 vaccine status: Completed vaccines  Qualifies for Shingles Vaccine? Yes   Zostavax completed No   Shingrix Completed?: No.    Education has been provided regarding the importance of this vaccine. Patient has been advised to call insurance company to determine out of pocket expense if they have not yet received this vaccine. Advised may also receive vaccine at local pharmacy or Health Dept. Verbalized acceptance and understanding.  Screening Tests Health Maintenance  Topic Date Due   Zoster Vaccines- Shingrix (1 of 2) Never done   COVID-19 Vaccine (5 - 2023-24 season) 02/26/2022   INFLUENZA VACCINE  01/27/2023   Medicare Annual Wellness (AWV)  10/13/2023   MAMMOGRAM  01/13/2024   DTaP/Tdap/Td (3 - Td or Tdap) 05/13/2027   COLONOSCOPY (Pts 45-94yrs Insurance coverage will need to be confirmed)  07/11/2031   Pneumonia Vaccine 66+ Years old  Completed   DEXA SCAN  Completed   Hepatitis C Screening  Completed   HPV VACCINES  Aged Out    Health Maintenance  Health Maintenance Due  Topic Date Due   Zoster  Vaccines- Shingrix (1 of 2) Never done   COVID-19 Vaccine (5 - 2023-24 season) 02/26/2022    Colorectal cancer screening: Type of screening: Colonoscopy. Completed yes. Repeat every 10 years  Mammogram status: Ordered yes. Pt provided with contact info and advised to call to schedule appt.   Bone Density status: Completed yes. Results reflect: Bone density results: OSTEOPENIA. Repeat every 5 years.  Lung Cancer Screening: (Low Dose CT Chest recommended if Age 31-80 years, 30 pack-year currently smoking OR have quit w/in 15years.) does not qualify.   Lung Cancer Screening Referral: no  Additional Screening:  Hepatitis C Screening: does not qualify; Completed yes  Vision Screening: Recommended annual ophthalmology exams for early detection of glaucoma and other disorders of the eye. Is the patient up to date with their annual eye exam?  Yes  Who is the provider or what is the name of the office in which the patient attends annual eye exams? [facility] If pt is not established with a provider, would they like to be referred to a provider to establish care? No .   Dental  Screening: Recommended annual dental exams for proper oral hygiene  Community Resource Referral / Chronic Care Management: CRR required this visit?  No   CCM required this visit?  No     Plan:     I have personally reviewed and noted the following in the patient's chart:   Medical and social history Use of alcohol, tobacco or illicit drugs  Current medications and supplements including opioid prescriptions. Patient is not currently taking opioid prescriptions. Functional ability and status Nutritional status Physical activity Advanced directives List of other physicians Hospitalizations, surgeries, and ER visits in previous 12 months Vitals Screenings to include cognitive, depression, and falls Referrals and appointments  In addition, I have reviewed and discussed with patient certain preventive  protocols, quality metrics, and best practice recommendations. A written personalized care plan for preventive services as well as general preventive health recommendations were provided to patient.    Sue Lush, LPN   1/61/0960   Nurse Notes: The patient states she is doing well and has no concerns or questions at this time.

## 2022-11-16 ENCOUNTER — Other Ambulatory Visit: Payer: Self-pay

## 2022-11-16 ENCOUNTER — Inpatient Hospital Stay (HOSPITAL_BASED_OUTPATIENT_CLINIC_OR_DEPARTMENT_OTHER): Payer: Medicare Other | Admitting: Oncology

## 2022-11-16 ENCOUNTER — Encounter: Payer: Self-pay | Admitting: Oncology

## 2022-11-16 ENCOUNTER — Inpatient Hospital Stay: Payer: Medicare Other | Attending: Oncology

## 2022-11-16 VITALS — BP 124/88 | HR 49 | Temp 98.2°F | Resp 18 | Wt 135.1 lb

## 2022-11-16 DIAGNOSIS — E785 Hyperlipidemia, unspecified: Secondary | ICD-10-CM

## 2022-11-16 DIAGNOSIS — Z95828 Presence of other vascular implants and grafts: Secondary | ICD-10-CM

## 2022-11-16 DIAGNOSIS — Z803 Family history of malignant neoplasm of breast: Secondary | ICD-10-CM | POA: Insufficient documentation

## 2022-11-16 DIAGNOSIS — K831 Obstruction of bile duct: Secondary | ICD-10-CM

## 2022-11-16 DIAGNOSIS — C787 Secondary malignant neoplasm of liver and intrahepatic bile duct: Secondary | ICD-10-CM | POA: Insufficient documentation

## 2022-11-16 DIAGNOSIS — D6481 Anemia due to antineoplastic chemotherapy: Secondary | ICD-10-CM

## 2022-11-16 DIAGNOSIS — Z87891 Personal history of nicotine dependence: Secondary | ICD-10-CM | POA: Diagnosis not present

## 2022-11-16 DIAGNOSIS — E559 Vitamin D deficiency, unspecified: Secondary | ICD-10-CM

## 2022-11-16 DIAGNOSIS — T451X5A Adverse effect of antineoplastic and immunosuppressive drugs, initial encounter: Secondary | ICD-10-CM | POA: Insufficient documentation

## 2022-11-16 DIAGNOSIS — M8580 Other specified disorders of bone density and structure, unspecified site: Secondary | ICD-10-CM

## 2022-11-16 DIAGNOSIS — C259 Malignant neoplasm of pancreas, unspecified: Secondary | ICD-10-CM

## 2022-11-16 DIAGNOSIS — E569 Vitamin deficiency, unspecified: Secondary | ICD-10-CM

## 2022-11-16 DIAGNOSIS — Z800 Family history of malignant neoplasm of digestive organs: Secondary | ICD-10-CM | POA: Insufficient documentation

## 2022-11-16 DIAGNOSIS — C250 Malignant neoplasm of head of pancreas: Secondary | ICD-10-CM | POA: Diagnosis present

## 2022-11-16 DIAGNOSIS — G620 Drug-induced polyneuropathy: Secondary | ICD-10-CM

## 2022-11-16 DIAGNOSIS — Z780 Asymptomatic menopausal state: Secondary | ICD-10-CM

## 2022-11-16 DIAGNOSIS — K8689 Other specified diseases of pancreas: Secondary | ICD-10-CM

## 2022-11-16 DIAGNOSIS — D696 Thrombocytopenia, unspecified: Secondary | ICD-10-CM

## 2022-11-16 LAB — COMPREHENSIVE METABOLIC PANEL
ALT: 22 U/L (ref 0–44)
AST: 28 U/L (ref 15–41)
Albumin: 4 g/dL (ref 3.5–5.0)
Alkaline Phosphatase: 75 U/L (ref 38–126)
Anion gap: 9 (ref 5–15)
BUN: 13 mg/dL (ref 8–23)
CO2: 25 mmol/L (ref 22–32)
Calcium: 8.9 mg/dL (ref 8.9–10.3)
Chloride: 103 mmol/L (ref 98–111)
Creatinine, Ser: 0.65 mg/dL (ref 0.44–1.00)
GFR, Estimated: >60 mL/min (ref >60)
Glucose, Bld: 71 mg/dL (ref 70–99)
Potassium: 3.9 mmol/L (ref 3.5–5.1)
Sodium: 137 mmol/L (ref 135–145)
Total Bilirubin: 0.6 mg/dL (ref 0.3–1.2)
Total Protein: 6.8 g/dL (ref 6.5–8.1)

## 2022-11-16 LAB — CBC WITH DIFFERENTIAL/PLATELET
Abs Immature Granulocytes: 0.01 10*3/uL (ref 0.00–0.07)
Basophils Absolute: 0 10*3/uL (ref 0.0–0.1)
Basophils Relative: 1 %
Eosinophils Absolute: 0.1 10*3/uL (ref 0.0–0.5)
Eosinophils Relative: 2 %
HCT: 38.3 % (ref 36.0–46.0)
Hemoglobin: 13.2 g/dL (ref 12.0–15.0)
Immature Granulocytes: 0 %
Lymphocytes Relative: 36 %
Lymphs Abs: 1.5 10*3/uL (ref 0.7–4.0)
MCH: 32.8 pg (ref 26.0–34.0)
MCHC: 34.5 g/dL (ref 30.0–36.0)
MCV: 95.3 fL (ref 80.0–100.0)
Monocytes Absolute: 0.4 10*3/uL (ref 0.1–1.0)
Monocytes Relative: 10 %
Neutro Abs: 2.1 10*3/uL (ref 1.7–7.7)
Neutrophils Relative %: 51 %
Platelets: 170 10*3/uL (ref 150–400)
RBC: 4.02 MIL/uL (ref 3.87–5.11)
RDW: 12.1 % (ref 11.5–15.5)
WBC: 4 10*3/uL (ref 4.0–10.5)
nRBC: 0 % (ref 0.0–0.2)

## 2022-11-16 LAB — FERRITIN: Ferritin: 20 ng/mL (ref 11–307)

## 2022-11-16 LAB — IRON AND TIBC
Iron: 114 ug/dL (ref 28–170)
Saturation Ratios: 26 % (ref 10.4–31.8)
TIBC: 433 ug/dL (ref 250–450)
UIBC: 319 ug/dL

## 2022-11-16 LAB — MAGNESIUM: Magnesium: 2.1 mg/dL (ref 1.7–2.4)

## 2022-11-16 MED ORDER — SODIUM CHLORIDE 0.9% FLUSH
10.0000 mL | Freq: Once | INTRAVENOUS | Status: AC
  Administered 2022-11-16: 10 mL via INTRAVENOUS
  Filled 2022-11-16: qty 10

## 2022-11-16 MED ORDER — HEPARIN SOD (PORK) LOCK FLUSH 100 UNIT/ML IV SOLN
500.0000 [IU] | Freq: Once | INTRAVENOUS | Status: AC
  Administered 2022-11-16: 500 [IU] via INTRAVENOUS
  Filled 2022-11-16: qty 5

## 2022-11-16 NOTE — Progress Notes (Signed)
Pt here for follow up. No new concerns voiced.   

## 2022-11-16 NOTE — Assessment & Plan Note (Signed)
fingertips and toes, grade 1/2.  She did not tolerate Cymbalta.   Stable symptoms. Acupuncture sessions as needed..  

## 2022-11-16 NOTE — Assessment & Plan Note (Signed)
Hemoglobin is stable. Monitor.  

## 2022-11-16 NOTE — Assessment & Plan Note (Signed)
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  Continue calcium supplementation, 2000 mg daily-divided in 2-3 doses  

## 2022-11-16 NOTE — Assessment & Plan Note (Addendum)
S/p  Whipple procedure..  Continue Creon, continue 16109 units TID with meals and  with her snacks Will check iron tibc ferritin, zinc, copper, vitamin D, magnesium.

## 2022-11-16 NOTE — Progress Notes (Signed)
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  Pathology proved liver metastatic disease- additional chemotherapy --> whipple procedure.--> adjuvant Gem/Abraxane finished in August 2023. ypT1b ypN0 Labs reviewed and discussed with patient -  09/21/2022 CT chest abdomen pelvis w contrast at [facility] showed-No evidence of metastatic disease in the chest, abdomen, or pelvis.  CA 19.9 remains low.  Continue clinical and imaging surveillance.  Chemotherapy-induced neuropathy (HCC) fingertips and toes, grade 1/2.  She did not tolerate Cymbalta.   Stable symptoms. Acupuncture sessions as needed..   Anemia due to antineoplastic chemotherapy Hemoglobin is stable. Monitor.   Port-A-Cath in place Continue port flush Q8-10 weeks.   Pancreatic insufficiency S/p  Whipple procedure..  Continue Creon, continue 41324 units TID with meals and  with her snacks Will check iron tibc ferritin, zinc, copper, vitamin D, magnesium.   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  Continue calcium supplementation, 2000 mg daily-divided in 2-3 doses    Orders Placed This Encounter  Procedures   Iron and TIBC    Standing Status:   Future    Number of Occurrences:   1    Standing Expiration Date:   11/16/2023   Ferritin    Standing Status:   Future    Number of Occurrences:   1    Standing Expiration Date:   11/16/2023   Magnesium    Standing Status:   Future    Number  of Occurrences:   1    Standing Expiration Date:   11/16/2023   Cancer antigen 19-9    Standing Status:   Future    Standing Expiration Date:   11/16/2023   CBC with Differential (Cancer Center Only)    Standing Status:   Future    Standing Expiration Date:   11/16/2023   CMP (Cancer Center only)    Standing Status:   Future    Standing Expiration Date:   11/16/2023   Vitamin B12    Standing Status:   Future    Standing Expiration Date:   11/16/2023   Folate    Standing Status:   Future    Standing Expiration Date:   11/16/2023   Copper, serum    Standing Status:   Future    Standing Expiration Date:   11/16/2023   Zinc    Standing Status:   Future    Standing Expiration Date:   11/16/2023   VITAMIN D 25 Hydroxy (Vit-D Deficiency, Fractures)    Standing Status:   Future    Standing Expiration Date:   11/16/2023    Follow up in 4 months.   All questions were answered. The patient knows to call the clinic with any problems, questions or concerns.  [name], MD, PhD [facility] Hematology Oncology 11/16/2022   Erasmo Downer, MD    HISTORY OF PRESENTING ILLNESS:   Cassandra [name] is a  67 y.o.  female with presents for follow up of Stage IV pancreatic adenocarcinoma Patient initially presented with jaundice, transaminitis, bilirubin was 9.9.  CA 19-9 was 1874.  Patient also reports unintentional weight loss. 02/08/2020 MRI abdomen and MRCP with and without contrast was done at [facility] which showed pancreatic head mass measuring up to 3 cm, with marked associated narrowing of the portal vein confluence.  SMA is preserved.  Marked intrahepatic and extrahepatic biliary duct dilatation as well as mild dilatation of the main pancreatic duct. Multiple hepatic masses highly concerning for metastatic disease.  Patient underwent EUS on 02/19/2020, which showed irregular mass identified in the pancreatic head, hypoechoic, measured 103mmx33mm, sonographic evidence concerning for invasion into the  superior mesenteric artery.  There is no sign of significant abnormality in the main pancreatic duct.  Dilatation of common bile duct which measured up to 16 mm.  Region of celiac artery was visualized and showed no signs of significant abnormality.  No lymphadenopathy.  FNA showed adenocarcinoma.  02/19/2020, ERCP, malignant.  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  The stricture was treated with placement of wall flex metal stent.  Patient was seen by [facility] oncology Dr. Olga Millers and was recommended for 3 drug regimen FOLFIRINOX.  Patient prefers to do chemotherapy locally at [facility].  Patient was referred to establish care today. She denies any pain.  Since stent placement, skin jaundice has improved.  Itchiness has also improved. Patient was accompanied by her husband today.  She has a family history of breast cancer in sister and paternal aunt, colon cancer paternal grandmother.  #No reportable targetable mutation on NGS 9/14/2021cycle 1 FOLFIRINOX.  Patient received oxaliplatin and about 50% of Irinotecan on day 1 and had experienced neurologic symptoms.  She went to ER and working diagnosis is TIA, and eventually I think this is due to irinotecan side effects -irinotecan-associated dysarthria, lip/tongue numbness.  Adjustment was made for Irinotecan to be  infused over 180 minutes.  Atropine 0.5 mg once prior to the irinotecan. No recurrent symptoms.   # 03/19/2020-03/21/2020 patient was admitted due to sepsis with strep pneumonia bacteremia.  Patient was treated with IV Rocephin.  TEE was done which showed no vegetation.  No PFO or ASD.  Patient was discharged home and he finished full course of 14 days of IV Rocephin on 04/02/2020 per ID recommendation.  Repeat blood culture was also negative.  08/25/20 cycle 10 FOLFIRINOX 09/08/20- present  Starting cycle 11, FOLFIRI, Oxaliplatin discontinued due to neuropathy   #NGS showed no  reportable targetable mutation #Genetic testing-[employer] diagnostic testing showed no pathological variants identified. MS stable, TMB 4mut/mb, KRAS G12D, SF3B1 K700E, TP53 V232fs*30  #01/14/2021  CT done at [facility] during the interval, stable disease.  # Her case was presented by Dr.Jia from [facility] tumor board. Clayborne Artist were 4~5 liver lesions on OSH MRI last year at the time of diagnosis highly suspicious for metastasis. She is not eligible for surgical protocol for metastasis resection given the number of liver metastasis is >3. However given her excellent and durable response to chemo, surgical resection may be considered pending sustained disease control at 1 year and may require partial hepatectomy first to see if there is viable tumor before proceeding to Whipple resection ]  Patient had a COVID-19 infection in September 2022.  03/23/2021 CT abdomen pelvis No significant change in the ill-defined pancreatic head mass or  associated biliary ductal and pancreatic ductal dilation.  Slight interval enlargement of subcentimeter anterior peripancreatic  lymph node, nonspecific. Attention on follow-up per clinical protocol. Similar enlargement of the ascending thoracic aorta measuring 4.4 cm  03/23/2021  MRI abdomen w and wo  Increasing ill-defined hypoenhancement of the pancreatic head surrounding the common bile duct stent, in keeping with known pancreatic malignancy. Anterior peripancreatic lymph node is better evaluated on CT.  Unchanged position of a common bile duct stent with similar mild diffuse intrahepatic ductal dilation and pneumobilia. No evidence of metastatic disease in the abdomen or pelvis. Partially visualized cystic left adnexal lesion measuring up to 3.9 cm.   04/10/2021, patient underwent liver resection at [facility], by Dr. [name] Ducking.   Pathology report from [facility] was reviewed. Segments 3 partial hepatectomy, negative for viable tumor. Section 5/6 partial hepatectomy part 1 and 2, microscopic foci  of residual viable adenocarcinoma, morphologically consistent with history of pancreatic primary.  Parenchymal margin is uninvolved.  #Patient resumed on FOLFIRI on 04/28/2021. She got another cycle on 05/12/2021 #05/26/2021, patient did not get additional chemotherapy due to transaminitis.  Shared decision was made to stop FOLFIRI and switch to gemcitabine Abraxane treatments.  05/26/2021, patient developed transaminitis, AST 763, ALT 691, alkaline phosphatase 364.  Bilirubin 0.7 05/26/2021 stat ultrasound abdomen right upper quadrant showed interval development of 3.3 x 1.8 cm complex mass in the right hepatic lobe.  Increased extrahepatic ductal dilatation.  Concerning for CBD obstruction. 05/29/2021, MRI abdomen MRCP with and without contrast showed unchanged pancreatic head soft tissue.  Severe intra and extrahepatic biliary ductal dilatation.  Common bile duct stents remain in position however patency is not established.  No evidence of lymphadenopathy or metastatic disease in the abdomen.  With further communication with radiologist, addendum was added that there is internal fluid signal and no appreciable associated contrast enhancement measuring 3.4 x 2.3 cm.  This appearance is generally not consistent with metastasis and is of uncertain significance.  Possibly reflecting hepatic abscess or residual of subcapsular hematoma.  Patient was seen by [facility] Dr. Elon Jester team.  He had ERCP done on 06/05/2021, with findings of One stent from the biliary tree was seen in the major papilla. The stent had migrated significantly into the duodenum. This is the cause of stent malfunction. One stent was removed from the biliary tree. Prior biliary sphincterotomy appeared open. - A single severe biliary stricture was found in the lower third of the main bile duct with upstream dilation. The stricture was alignant appearing.- The biliary tree was swept and sludge was found.- One fully covered metal stent was placed into  the common bile duct across the stricture.- No pancreatogram performed.  06/12/2021-08/10/2021 gemcitabine and Abraxane.   10/14/2021, patient underwent Whipple procedure. Liver biopsy negative for malignancy. Review procedure showed invasive adenocarcinoma, moderately differentiated, centered in pancreatic head and confined in the Pancreas. Surgical margin is negative for malignancy.  36 lymph nodes were all negative for malignancy.  1 hepatic artery lymph node was harvested and was negative.  Gallbladder negative for malignancy.  Cystic duct excision negative for malignancy. pT1b pN0  10/25/2021, CT abdomen pelvis with contrast showed rim-enhancing fluid collection in the region of the hepatic hilum. Patient had a readmission for superficial wound infection.  She completed antibiotics on 11/03/2021.  11/03/2021, CT abdomen pelvis with contrast showed near resolution of previously seen fluid collection in the region of the hepatic hilum.  Similar appearance of the area of hypoenhancement in the liver.  Attention on follow-up.  11/17/2021, resumed on gemcitabine/Abraxane. 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   INTERVAL HISTORY Cassandra [name] is a 67 y.o. female who has above history reviewed by me today presents for pancreatic cancer.  She is doing well clinically.  Recent CT scan at [facility] showed no evidence of recurrence. Patient has no new complaints.  Weight has been stable.  She takes calcium supplementation as instructed.  + cramps of her lowe extremities at night.  She take Creon for pancreatic insufficiency.    Review of Systems  Constitutional:  Negative for appetite change, chills, fatigue, fever and unexpected weight change.  HENT:   Negative for hearing loss and voice change.   Eyes:  Negative for eye problems.  Respiratory:  Negative for chest tightness and cough.   Cardiovascular:  Negative for chest pain.  Gastrointestinal:  Negative for abdominal distention, abdominal  pain, blood in stool and nausea.  Endocrine: Negative for hot flashes.  Genitourinary:  Negative for difficulty urinating and frequency.   Musculoskeletal:  Negative for arthralgias.       Leg cramps  Skin:  Negative for itching and rash.  Neurological:  Positive for numbness. Negative for extremity weakness.  Hematological:  Negative for adenopathy.  Psychiatric/Behavioral:  Negative for confusion.     MEDICAL HISTORY:  Past Medical History:  Diagnosis Date   Allergy    Anemia    Cancer (HCC)    pancreatic cancer   Colon polyps    Family history of breast cancer    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   Neutropenia (HCC) 04/18/2020   Osteopenia after menopause 05/2017   femoral neck T score -2.0   Personal history of chemotherapy    current for pancreatic ca   PONV (postoperative nausea and vomiting)    Pure hypercholesterolemia     SURGICAL HISTORY: Past Surgical History:  Procedure Laterality Date   COLONOSCOPY  07/2015   WNL   COLONOSCOPY  2008/2011   COLONOSCOPY WITH PROPOFOL N/A 07/10/2021   Procedure: COLONOSCOPY WITH PROPOFOL;  Surgeon: Regis Bill, MD;  Location: [facility] ENDOSCOPY;  Service: Endoscopy;  Laterality: N/A;   IR CV LINE INJECTION  04/28/2021   OVARIAN CYST REMOVAL  1992   dermoid-Dr CAK   PORTA CATH INSERTION N/A 03/07/2020   Procedure: PORTA CATH INSERTION;  Surgeon: Annice Needy, MD;  Location: [facility] INVASIVE CV LAB;  Service: Cardiovascular;  Laterality: N/A;   TEE WITHOUT CARDIOVERSION N/A 03/21/2020   Procedure: TRANSESOPHAGEAL ECHOCARDIOGRAM (TEE);  Surgeon: Laurier Nancy, MD;  Location: [facility] ORS;  Service: Cardiovascular;  Laterality: N/A;   TUBAL LIGATION  1993    SOCIAL HISTORY: Social History   Socioeconomic History   Marital status: Married    Spouse name: Not on file   Number of children: 2   Years of education: Not on file   Highest education level: Not on file  Occupational History   Occupation: Teacher     Comment: retired   Occupation: Visual merchandiser  Tobacco Use   Smoking status: Former    Packs/day: 1.00    Years: 10.00    Additional pack years: 0.00    Total pack years: 10.00    Types: Cigarettes    Quit date: 06/28/1985    Years since quitting: 37.4   Smokeless tobacco: Never   Tobacco comments:    Quit smoking 1987  Vaping Use   Vaping Use: Never used  Substance and Sexual Activity   Alcohol use: Yes    Alcohol/week: 5.0 standard drinks of alcohol    Types: 5 Glasses of wine per week    Comment: 0-2 mixed drinks a day   Drug use: No  Sexual activity: Not Currently    Birth control/protection: Post-menopausal  Other Topics Concern   Not on file  Social History Narrative   Not on file   Social Determinants of Health   Financial Resource Strain: Low Risk  (10/09/2022)   Overall Financial Resource Strain (CARDIA)    Difficulty of Paying Living Expenses: Not hard at all  Food Insecurity: No Food Insecurity (10/09/2022)   Hunger Vital Sign    Worried About Running Out of Food in the Last Year: Never true    Ran Out of Food in the Last Year: Never true  Transportation Needs: No Transportation Needs (10/09/2022)   PRAPARE - Administrator, Civil Service (Medical): No    Lack of Transportation (Non-Medical): No  Physical Activity: Insufficiently Active (10/09/2022)   Exercise Vital Sign    Days of Exercise per Week: 4 days    Minutes of Exercise per Session: 30 min  Stress: No Stress Concern Present (10/09/2022)   [employer] of Occupational Health - Occupational Stress Questionnaire    Feeling of Stress : Not at all  Social Connections: Unknown (10/09/2022)   Social Connection and Isolation Panel [NHANES]    Frequency of Communication with Friends and Family: More than three times a week    Frequency of Social Gatherings with Friends and Family: More than three times a week    Attends Religious Services: Not on Insurance claims handler of Clubs or Organizations: No     Attends Banker Meetings: Never    Marital Status: Married  Catering manager Violence: Not At Risk (10/13/2022)   Humiliation, Afraid, Rape, and Kick questionnaire    Fear of Current or Ex-Partner: No    Emotionally Abused: No    Physically Abused: No    Sexually Abused: No    FAMILY HISTORY: Family History  Problem Relation Age of Onset   Breast cancer Paternal Aunt 75   Diabetes Mother    Osteoporosis Mother    Hyperlipidemia Father    Rheumatic fever Father    Valvular heart disease Father    Cancer Paternal Grandmother        possible colon   Breast cancer Sister 54    ALLERGIES:  is allergic to penicillin g.  MEDICATIONS:  Current Outpatient Medications  Medication Sig Dispense Refill   Calcium Carbonate-Vit D-Min (CALCIUM 1200 PO) Take by mouth.     Cholecalciferol 25 MCG (1000 UT) tablet Take 2,000 Units by mouth daily.     clobetasol cream (TEMOVATE) 0.05 %      Cyanocobalamin (B-12 PO) Take by mouth.     lidocaine-prilocaine (EMLA) cream APPLY TO AFFECTED AREA ONCE AS DIRECTED 30 g 6   Multiple Vitamins-Minerals (MULTIVITAMIN WITH MINERALS) tablet Take 1 tablet by mouth daily.     Pancrelipase, Lip-Prot-Amyl, (CREON) 24000-76000 units CPEP TAKE 1 CAPSULE THREE TIMES DAILY BEFORE MEALS 200 capsule 3   ondansetron (ZOFRAN-ODT) 4 MG disintegrating tablet Take 4 mg by mouth every 8 (eight) hours as needed. (Patient not taking: Reported on 11/16/2022)     prochlorperazine (COMPAZINE) 10 MG tablet  (Patient not taking: Reported on 11/16/2022)     simethicone (GAS-X) 80 MG chewable tablet Chew 1 tablet (80 mg total) by mouth every 8 (eight) hours as needed for flatulence. (Patient not taking: Reported on 11/16/2022) 60 tablet 0   No current facility-administered medications for this visit.   Facility-Administered Medications Ordered in Other Visits  Medication Dose Route Frequency Provider  Last Rate Last Admin   heparin lock flush 100 UNIT/ML injection             prochlorperazine (COMPAZINE) 10 MG tablet            sodium chloride flush (NS) 0.9 % injection 10 mL  10 mL Intravenous PRN [name], MD   10 mL at 02/18/21 0858   sodium chloride flush (NS) 0.9 % injection 10 mL  10 mL Intravenous Once Borders, Ivin Booty R, NP       sodium chloride flush (NS) 0.9 % injection 10 mL  10 mL Intravenous Once [name], MD         PHYSICAL EXAMINATION: ECOG PERFORMANCE STATUS: 0 - Asymptomatic Vitals:   11/16/22 0930  BP: 124/88  Pulse: (!) 49  Resp: 18  Temp: 98.2 F (36.8 C)  SpO2: 100%   Filed Weights   11/16/22 0930  Weight: 135 lb 1.6 oz (61.3 kg)    Physical Exam Constitutional:      General: She is not in acute distress. HENT:     Head: Normocephalic and atraumatic.  Eyes:     General: No scleral icterus. Cardiovascular:     Rate and Rhythm: Normal rate and regular rhythm.     Heart sounds: Normal heart sounds.  Pulmonary:     Effort: Pulmonary effort is normal. No respiratory distress.     Breath sounds: No wheezing.  Abdominal:     General: Bowel sounds are normal. There is no distension.     Palpations: Abdomen is soft.  Musculoskeletal:        General: No deformity. Normal range of motion.     Cervical back: Normal range of motion and neck supple.  Skin:    General: Skin is warm and dry.     Coloration: Skin is not jaundiced.     Comments: Left basal cell carcinoma status post Mohs surgery  Neurological:     Mental Status: She is alert and oriented to person, place, and time. Mental status is at baseline.     Cranial Nerves: No cranial nerve deficit.     Coordination: Coordination normal.  Psychiatric:        Mood and Affect: Mood normal.     LABORATORY DATA:  I have reviewed the data as listed    Latest Ref Rng & Units 11/16/2022    9:05 AM 07/27/2022   10:12 AM 04/05/2022    9:20 AM  CBC  WBC 4.0 - 10.5 K/uL 4.0  4.3  4.9   Hemoglobin 12.0 - 15.0 g/dL 09.8  11.9  14.7   Hematocrit 36.0 - 46.0 % 38.3  38.4  38.9    Platelets 150 - 400 K/uL 170  179  184       Latest Ref Rng & Units 11/16/2022    9:05 AM 07/27/2022   10:12 AM 04/05/2022    9:20 AM  CMP  Glucose 70 - 99 mg/dL 71  79  829   BUN 8 - 23 mg/dL 13  14  14    Creatinine 0.44 - 1.00 mg/dL 5.62  1.30  8.65   Sodium 135 - 145 mmol/L 137  137  137   Potassium 3.5 - 5.1 mmol/L 3.9  3.9  4.1   Chloride 98 - 111 mmol/L 103  105  107   CO2 22 - 32 mmol/L 25  24  26    Calcium 8.9 - 10.3 mg/dL 8.9  8.7  8.5   Total  Protein 6.5 - 8.1 g/dL 6.8  6.7  6.6   Total Bilirubin 0.3 - 1.2 mg/dL 0.6  0.6  0.7   Alkaline Phos 38 - 126 U/L 75  79  86   AST 15 - 41 U/L 28  31  34   ALT 0 - 44 U/L 22  25  30       RADIOGRAPHIC STUDIES: I have personally reviewed the radiological images as listed and agreed with the findings in the report. Reviewed findings of MRI abdomen MRCP done at [facility]. No results found.

## 2022-11-16 NOTE — Assessment & Plan Note (Addendum)
Continue port flush Q8-10 weeks.

## 2022-11-16 NOTE — Assessment & Plan Note (Addendum)
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  Pathology proved liver metastatic disease- additional chemotherapy --> whipple procedure.--> adjuvant Gem/Abraxane finished in August 2023. ypT1b ypN0 Labs reviewed and discussed with patient -  09/21/2022 CT chest abdomen pelvis w contrast at [facility] showed-No evidence of metastatic disease in the chest, abdomen, or pelvis.  CA 19.9 remains low.  Continue clinical and imaging surveillance.

## 2022-11-17 LAB — CANCER ANTIGEN 19-9: CA 19-9: 6 U/mL (ref 0–35)

## 2022-11-29 ENCOUNTER — Other Ambulatory Visit: Payer: Self-pay | Admitting: Family Medicine

## 2022-11-29 DIAGNOSIS — Z1231 Encounter for screening mammogram for malignant neoplasm of breast: Secondary | ICD-10-CM

## 2023-01-11 ENCOUNTER — Inpatient Hospital Stay: Payer: Medicare Other

## 2023-01-14 ENCOUNTER — Ambulatory Visit
Admission: RE | Admit: 2023-01-14 | Discharge: 2023-01-14 | Disposition: A | Discharge status: Home / Self Care | Payer: Medicare Other | Source: Ambulatory Visit | Attending: Family Medicine | Admitting: Family Medicine

## 2023-01-14 DIAGNOSIS — Z1231 Encounter for screening mammogram for malignant neoplasm of breast: Secondary | ICD-10-CM | POA: Insufficient documentation

## 2023-01-20 ENCOUNTER — Encounter: Payer: Self-pay | Admitting: Oncology

## 2023-01-27 ENCOUNTER — Telehealth: Payer: Self-pay

## 2023-01-27 NOTE — Telephone Encounter (Signed)
[employer] patient assistance form for creon has been completed and faxed to [phone]. copy sent to scan in chart.

## 2023-02-16 ENCOUNTER — Inpatient Hospital Stay: Payer: Medicare Other | Attending: Oncology

## 2023-02-16 ENCOUNTER — Inpatient Hospital Stay (HOSPITAL_BASED_OUTPATIENT_CLINIC_OR_DEPARTMENT_OTHER): Payer: Medicare Other | Admitting: Oncology

## 2023-02-16 ENCOUNTER — Encounter: Payer: Self-pay | Admitting: Oncology

## 2023-02-16 VITALS — BP 135/80 | HR 44 | Temp 97.7°F | Resp 18 | Wt 134.6 lb

## 2023-02-16 DIAGNOSIS — C259 Malignant neoplasm of pancreas, unspecified: Secondary | ICD-10-CM

## 2023-02-16 DIAGNOSIS — K8689 Other specified diseases of pancreas: Secondary | ICD-10-CM

## 2023-02-16 DIAGNOSIS — T451X5A Adverse effect of antineoplastic and immunosuppressive drugs, initial encounter: Secondary | ICD-10-CM | POA: Insufficient documentation

## 2023-02-16 DIAGNOSIS — Z95828 Presence of other vascular implants and grafts: Secondary | ICD-10-CM

## 2023-02-16 DIAGNOSIS — M8580 Other specified disorders of bone density and structure, unspecified site: Secondary | ICD-10-CM

## 2023-02-16 DIAGNOSIS — C787 Secondary malignant neoplasm of liver and intrahepatic bile duct: Secondary | ICD-10-CM | POA: Insufficient documentation

## 2023-02-16 DIAGNOSIS — Z87891 Personal history of nicotine dependence: Secondary | ICD-10-CM | POA: Insufficient documentation

## 2023-02-16 DIAGNOSIS — Z79620 Long term (current) use of immunosuppressive biologic: Secondary | ICD-10-CM | POA: Diagnosis not present

## 2023-02-16 DIAGNOSIS — E559 Vitamin D deficiency, unspecified: Secondary | ICD-10-CM

## 2023-02-16 DIAGNOSIS — D6481 Anemia due to antineoplastic chemotherapy: Secondary | ICD-10-CM

## 2023-02-16 DIAGNOSIS — G620 Drug-induced polyneuropathy: Secondary | ICD-10-CM

## 2023-02-16 DIAGNOSIS — C250 Malignant neoplasm of head of pancreas: Secondary | ICD-10-CM | POA: Diagnosis present

## 2023-02-16 DIAGNOSIS — Z79899 Other long term (current) drug therapy: Secondary | ICD-10-CM | POA: Insufficient documentation

## 2023-02-16 LAB — CBC WITH DIFFERENTIAL (CANCER CENTER ONLY)
Abs Immature Granulocytes: 0.02 10*3/uL (ref 0.00–0.07)
Basophils Absolute: 0 10*3/uL (ref 0.0–0.1)
Basophils Relative: 0 %
Eosinophils Absolute: 0.1 10*3/uL (ref 0.0–0.5)
Eosinophils Relative: 2 %
HCT: 39.4 % (ref 36.0–46.0)
Hemoglobin: 13.6 g/dL (ref 12.0–15.0)
Immature Granulocytes: 0 %
Lymphocytes Relative: 34 %
Lymphs Abs: 1.9 10*3/uL (ref 0.7–4.0)
MCH: 33.1 pg (ref 26.0–34.0)
MCHC: 34.5 g/dL (ref 30.0–36.0)
MCV: 95.9 fL (ref 80.0–100.0)
Monocytes Absolute: 0.5 10*3/uL (ref 0.1–1.0)
Monocytes Relative: 9 %
Neutro Abs: 3 10*3/uL (ref 1.7–7.7)
Neutrophils Relative %: 55 %
Platelet Count: 184 10*3/uL (ref 150–400)
RBC: 4.11 MIL/uL (ref 3.87–5.11)
RDW: 11.8 % (ref 11.5–15.5)
WBC Count: 5.5 10*3/uL (ref 4.0–10.5)
nRBC: 0 % (ref 0.0–0.2)

## 2023-02-16 LAB — CMP (CANCER CENTER ONLY)
ALT: 22 U/L (ref 0–44)
AST: 26 U/L (ref 15–41)
Albumin: 4 g/dL (ref 3.5–5.0)
Alkaline Phosphatase: 76 U/L (ref 38–126)
Anion gap: 6 (ref 5–15)
BUN: 14 mg/dL (ref 8–23)
CO2: 25 mmol/L (ref 22–32)
Calcium: 9 mg/dL (ref 8.9–10.3)
Chloride: 107 mmol/L (ref 98–111)
Creatinine: 0.67 mg/dL (ref 0.44–1.00)
GFR, Estimated: >60 mL/min (ref >60)
Glucose, Bld: 88 mg/dL (ref 70–99)
Potassium: 3.9 mmol/L (ref 3.5–5.1)
Sodium: 138 mmol/L (ref 135–145)
Total Bilirubin: 0.7 mg/dL (ref 0.3–1.2)
Total Protein: 6.8 g/dL (ref 6.5–8.1)

## 2023-02-16 LAB — VITAMIN B12: Vitamin B-12: 466 pg/mL (ref 180–914)

## 2023-02-16 LAB — FOLATE: Folate: >40 ng/mL (ref >5.9)

## 2023-02-16 LAB — VITAMIN D 25 HYDROXY (VIT D DEFICIENCY, FRACTURES): Vit D, 25-Hydroxy: 39.83 ng/mL (ref 30–100)

## 2023-02-16 MED ORDER — HEPARIN SOD (PORK) LOCK FLUSH 100 UNIT/ML IV SOLN
500.0000 [IU] | Freq: Once | INTRAVENOUS | Status: AC
  Administered 2023-02-16: 500 [IU] via INTRAVENOUS
  Filled 2023-02-16: qty 5

## 2023-02-16 MED ORDER — SODIUM CHLORIDE 0.9% FLUSH
10.0000 mL | Freq: Once | INTRAVENOUS | Status: AC
  Administered 2023-02-16: 10 mL via INTRAVENOUS
  Filled 2023-02-16: qty 10

## 2023-02-16 NOTE — Progress Notes (Signed)
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  Pathology proved liver metastatic disease- additional chemotherapy --> whipple procedure.--> adjuvant Gem/Abraxane finished in August 2023. ypT1b ypN0 Labs reviewed and discussed with patient -  12/21/2022 CT chest abdomen pelvis w contrast at [facility] showed-No evidence of metastatic disease in the chest, abdomen, or pelvis.  CA 19.9 remains low.  Continue clinical and imaging surveillance.  Chemotherapy-induced neuropathy (HCC) fingertips and toes, grade 1/2.  She did not tolerate Cymbalta.   Stable symptoms. Acupuncture sessions as needed..   Port-A-Cath in place Continue port flush Q8-10 weeks.   Pancreatic insufficiency S/p  Whipple procedure..  Continue Creon, continue 50093 units TID with meals and  with her snacks Will check iron tibc ferritin, zinc, copper, vitamin D, magnesium.   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  Continue calcium supplementation, 2000 mg daily-divided in 2-3 doses     Orders Placed This Encounter  Procedures   Cancer antigen 19-9    Standing Status:   Future    Standing Expiration Date:   02/16/2024   CBC with Differential (Cancer Center Only)    Standing Status:   Future    Standing Expiration Date:   02/16/2024   CMP (Cancer Center only)    Standing Status:   Future    Standing Expiration Date:   02/16/2024    Follow up in 3-4 months.   All questions were  answered. The patient knows to call the clinic with any problems, questions or concerns.  [name], MD, PhD [facility] Hematology Oncology 02/16/2023   Erasmo Downer, MD    HISTORY OF PRESENTING ILLNESS:   Cassandra [name] is a  67 y.o.  female with presents for follow up of Stage IV pancreatic adenocarcinoma Patient initially presented with jaundice, transaminitis, bilirubin was 9.9.  CA 19-9 was 1874.  Patient also reports unintentional weight loss. 02/08/2020 MRI abdomen and MRCP with and without contrast was done at [facility] which showed pancreatic head mass measuring up to 3 cm, with marked associated narrowing of the portal vein confluence.  SMA is preserved.  Marked intrahepatic and extrahepatic biliary duct dilatation as well as mild dilatation of the main pancreatic duct. Multiple hepatic masses highly concerning for metastatic disease.  Patient underwent EUS on 02/19/2020, which showed irregular mass identified in the pancreatic head, hypoechoic, measured 9mmx33mm, sonographic evidence concerning for invasion into the superior mesenteric artery.  There is no sign of significant abnormality in the main pancreatic duct.  Dilatation of common bile duct which measured up to 16 mm.  Region of celiac artery was visualized and showed no signs of significant abnormality.  No lymphadenopathy.  FNA showed adenocarcinoma.  02/19/2020, ERCP, malignant.  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  The stricture was treated with placement of wall flex metal stent.  Patient was seen by [facility] oncology Dr. Olga Millers and was recommended for 3 drug regimen FOLFIRINOX.  Patient prefers to do chemotherapy locally at [facility].  Patient was referred to  establish care today. She denies any pain.  Since stent placement, skin jaundice has improved.  Itchiness has also improved. Patient was accompanied by her husband today.  She has a family history of breast  cancer in sister and paternal aunt, colon cancer paternal grandmother.  #No reportable targetable mutation on NGS 9/14/2021cycle 1 FOLFIRINOX.  Patient received oxaliplatin and about 50% of Irinotecan on day 1 and had experienced neurologic symptoms.  She went to ER and working diagnosis is TIA, and eventually I think this is due to irinotecan side effects -irinotecan-associated dysarthria, lip/tongue numbness.  Adjustment was made for Irinotecan to be  infused over 180 minutes.  Atropine 0.5 mg once prior to the irinotecan. No recurrent symptoms.   # 03/19/2020-03/21/2020 patient was admitted due to sepsis with strep pneumonia bacteremia.  Patient was treated with IV Rocephin.  TEE was done which showed no vegetation.  No PFO or ASD.  Patient was discharged home and he finished full course of 14 days of IV Rocephin on 04/02/2020 per ID recommendation.  Repeat blood culture was also negative.  08/25/20 cycle 10 FOLFIRINOX 09/08/20- present  Starting cycle 11, FOLFIRI, Oxaliplatin discontinued due to neuropathy   #NGS showed no reportable targetable mutation #Genetic testing-[employer] diagnostic testing showed no pathological variants identified. MS stable, TMB 66mut/mb, KRAS G12D, SF3B1 K700E, TP53 V220fs*30  #01/14/2021  CT done at [facility] during the interval, stable disease.  # Her case was presented by Dr.Jia from [facility] tumor board. Clayborne Artist were 4~5 liver lesions on OSH MRI last year at the time of diagnosis highly suspicious for metastasis. She is not eligible for surgical protocol for metastasis resection given the number of liver metastasis is >3. However given her excellent and durable response to chemo, surgical resection may be considered pending sustained disease control at 1 year and may require partial hepatectomy first to see if there is viable tumor before proceeding to Whipple resection ]  Patient had a COVID-19 infection in September 2022.  03/23/2021 CT abdomen pelvis No significant change in  the ill-defined pancreatic head mass or  associated biliary ductal and pancreatic ductal dilation.  Slight interval enlargement of subcentimeter anterior peripancreatic  lymph node, nonspecific. Attention on follow-up per clinical protocol. Similar enlargement of the ascending thoracic aorta measuring 4.4 cm  03/23/2021 MRI abdomen w and wo  Increasing ill-defined hypoenhancement of the pancreatic head surrounding the common bile duct stent, in keeping with known pancreatic malignancy. Anterior peripancreatic lymph node is better evaluated on CT.  Unchanged position of a common bile duct stent with similar mild diffuse intrahepatic ductal dilation and pneumobilia. No evidence of metastatic disease in the abdomen or pelvis. Partially visualized cystic left adnexal lesion measuring up to 3.9 cm.   04/10/2021, patient underwent liver resection at [facility], by Dr. [name] Ducking.   Pathology report from [facility] was reviewed. Segments 3 partial hepatectomy, negative for viable tumor. Section 5/6 partial hepatectomy part 1 and 2, microscopic foci of residual viable adenocarcinoma, morphologically consistent with history of pancreatic primary.  Parenchymal margin is uninvolved.  #Patient resumed on FOLFIRI on 04/28/2021. She got another cycle on 05/12/2021 #05/26/2021, patient did not get additional chemotherapy due to transaminitis.  Shared decision was made to stop FOLFIRI and switch to gemcitabine Abraxane treatments.  05/26/2021, patient developed transaminitis, AST 763, ALT 691, alkaline phosphatase 364.  Bilirubin 0.7 05/26/2021 stat ultrasound abdomen right upper quadrant showed interval development of 3.3 x 1.8 cm complex mass in the right hepatic lobe.  Increased extrahepatic ductal dilatation.  Concerning for CBD obstruction. 05/29/2021, MRI abdomen MRCP with and without contrast showed unchanged pancreatic head soft tissue.  Severe intra and extrahepatic biliary ductal dilatation.  Common bile duct stents remain in  position however patency is not established.  No evidence of lymphadenopathy or metastatic disease in the abdomen.  With further communication with radiologist, addendum was added that there is internal fluid signal and no appreciable associated contrast enhancement measuring 3.4 x 2.3 cm.  This appearance is generally not consistent with metastasis and is of uncertain significance.  Possibly reflecting hepatic abscess or residual of subcapsular hematoma.  Patient was seen by [facility] Dr. Elon Jester team.  He had ERCP done on 06/05/2021, with findings of One stent from the biliary tree was seen in the major papilla. The stent had migrated significantly into the duodenum. This is the cause of stent malfunction. One stent was removed from the biliary tree. Prior biliary sphincterotomy appeared open. - A single severe biliary stricture was found in the lower third of the main bile duct with upstream dilation. The stricture was alignant appearing.- The biliary tree was swept and sludge was found.- One fully covered metal stent was placed into the common bile duct across the stricture.- No pancreatogram performed.  06/12/2021-08/10/2021 gemcitabine and Abraxane.   10/14/2021, patient underwent Whipple procedure. Liver biopsy negative for malignancy. Review procedure showed invasive adenocarcinoma, moderately differentiated, centered in pancreatic head and confined in the Pancreas. Surgical margin is negative for malignancy.  36 lymph nodes were all negative for malignancy.  1 hepatic artery lymph node was harvested and was negative.  Gallbladder negative for malignancy.  Cystic duct excision negative for malignancy. pT1b pN0  10/25/2021, CT abdomen pelvis with contrast showed rim-enhancing fluid collection in the region of the hepatic hilum. Patient had a readmission for superficial wound infection.  She completed antibiotics on 11/03/2021.  11/03/2021, CT abdomen pelvis with contrast showed near resolution of previously  seen fluid collection in the region of the hepatic hilum.  Similar appearance of the area of hypoenhancement in the liver.  Attention on follow-up.  11/17/2021, resumed on gemcitabine/Abraxane. 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  12/12/ 2023 CT scan at [facility] shows no evidence of cancer recurrence.-NED 09/21/2022 CT chest abdomen pelvis w contrast at [facility] showed-No evidence of metastatic disease in the chest, abdomen, or pelvis.  INTERVAL HISTORY Cassandra [name] is a 67 y.o. female who has above history reviewed by me today presents for pancreatic cancer.  She is doing well clinically.  Recent CT scan at [facility] showed no evidence of recurrence. Patient has no new complaints.  Weight has been stable.  She takes calcium supplementation as instructed.  + cramps of her lowe extremities at night is better after increased hydration and exercise.  She take Creon for pancreatic insufficiency.    Review of Systems  Constitutional:  Negative for appetite change, chills, fatigue, fever and unexpected weight change.  HENT:   Negative for hearing loss and voice change.   Eyes:  Negative for eye problems.  Respiratory:  Negative for chest tightness and cough.   Cardiovascular:  Negative for chest pain.  Gastrointestinal:  Negative for abdominal distention, abdominal pain, blood in stool and nausea.  Endocrine: Negative for hot flashes.  Genitourinary:  Negative for difficulty urinating and frequency.   Musculoskeletal:  Negative for arthralgias.       Leg cramps  Skin:  Negative for itching and rash.  Neurological:  Positive for numbness. Negative for extremity weakness.  Hematological:  Negative for adenopathy.  Psychiatric/Behavioral:  Negative for confusion.     MEDICAL HISTORY:  Past Medical History:  Diagnosis Date   Allergy    Anemia    Cancer (HCC)    pancreatic cancer   Colon polyps    Family history of breast cancer    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    Neutropenia (HCC) 04/18/2020   Osteopenia after menopause 05/2017   femoral neck T score -2.0   Personal history of chemotherapy    current for pancreatic ca   PONV (postoperative nausea and vomiting)    Pure hypercholesterolemia     SURGICAL HISTORY: Past Surgical History:  Procedure Laterality Date   COLONOSCOPY  07/2015   WNL   COLONOSCOPY  2008/2011   COLONOSCOPY WITH PROPOFOL N/A 07/10/2021   Procedure: COLONOSCOPY WITH PROPOFOL;  Surgeon: Regis Bill, MD;  Location: [facility] ENDOSCOPY;  Service: Endoscopy;  Laterality: N/A;   IR CV LINE INJECTION  04/28/2021   OVARIAN CYST REMOVAL  1992   dermoid-Dr CAK   PORTA CATH INSERTION N/A 03/07/2020   Procedure: PORTA CATH INSERTION;  Surgeon: Annice Needy, MD;  Location: [facility] INVASIVE CV LAB;  Service: Cardiovascular;  Laterality: N/A;   TEE WITHOUT CARDIOVERSION N/A 03/21/2020   Procedure: TRANSESOPHAGEAL ECHOCARDIOGRAM (TEE);  Surgeon: Laurier Nancy, MD;  Location: [facility] ORS;  Service: Cardiovascular;  Laterality: N/A;   TUBAL LIGATION  1993    SOCIAL HISTORY: Social History   Socioeconomic History   Marital status: Married    Spouse name: Not on file   Number of children: 2   Years of education: Not on file   Highest education level: Not on file  Occupational History   Occupation: Teacher    Comment: retired   Occupation: Visual merchandiser  Tobacco Use   Smoking status: Former    Current packs/day: 0.00    Average packs/day: 1 pack/day for 10.0 years (10.0 ttl pk-yrs)    Types: Cigarettes    Start date: 06/29/1975    Quit date: 06/28/1985    Years since quitting: 37.6   Smokeless tobacco: Never   Tobacco comments:    Quit smoking 1987  Vaping Use   Vaping status: Never Used  Substance and Sexual Activity   Alcohol use: Yes    Alcohol/week: 5.0 standard drinks of alcohol    Types: 5 Glasses of wine per week    Comment: 0-2 mixed drinks a day   Drug use: No   Sexual activity: Not Currently    Birth control/protection:  Post-menopausal  Other Topics Concern   Not on file  Social History Narrative   Not on file   Social Determinants of Health   Financial Resource Strain: Low Risk  (10/09/2022)   Overall Financial Resource Strain (CARDIA)    Difficulty of Paying Living Expenses: Not hard at all  Food Insecurity: No Food Insecurity (10/09/2022)   Hunger Vital Sign    Worried About Running Out of Food in the Last Year: Never true    Ran Out of Food in the Last Year: Never true  Transportation Needs: No Transportation Needs (10/09/2022)   PRAPARE - Administrator, Civil Service (Medical): No    Lack of Transportation (Non-Medical): No  Physical Activity: Insufficiently Active (10/09/2022)   Exercise Vital Sign    Days of Exercise per Week: 4 days    Minutes of Exercise per Session: 30 min  Stress: No Stress Concern Present (  10/09/2022)   [employer] of Occupational Health - Occupational Stress Questionnaire    Feeling of Stress : Not at all  Social Connections: Unknown (10/09/2022)   Social Connection and Isolation Panel [NHANES]    Frequency of Communication with Friends and Family: More than three times a week    Frequency of Social Gatherings with Friends and Family: More than three times a week    Attends Religious Services: Not on Marketing executive or Organizations: No    Attends Banker Meetings: Never    Marital Status: Married  Catering manager Violence: Not At Risk (10/13/2022)   Humiliation, Afraid, Rape, and Kick questionnaire    Fear of Current or Ex-Partner: No    Emotionally Abused: No    Physically Abused: No    Sexually Abused: No    FAMILY HISTORY: Family History  Problem Relation Age of Onset   Breast cancer Paternal Aunt 48   Diabetes Mother    Osteoporosis Mother    Hyperlipidemia Father    Rheumatic fever Father    Valvular heart disease Father    Cancer Paternal Grandmother        possible colon   Breast cancer Sister 52     ALLERGIES:  is allergic to penicillin g.  MEDICATIONS:  Current Outpatient Medications  Medication Sig Dispense Refill   Calcium Carbonate-Vit D-Min (CALCIUM 1200 PO) Take by mouth.     Cholecalciferol 25 MCG (1000 UT) tablet Take 2,000 Units by mouth daily.     clobetasol cream (TEMOVATE) 0.05 %      Cyanocobalamin (B-12 PO) Take by mouth.     lidocaine-prilocaine (EMLA) cream APPLY TO AFFECTED AREA ONCE AS DIRECTED 30 g 6   Multiple Vitamins-Minerals (MULTIVITAMIN WITH MINERALS) tablet Take 1 tablet by mouth daily.     Pancrelipase, Lip-Prot-Amyl, (CREON) 24000-76000 units CPEP TAKE 1 CAPSULE THREE TIMES DAILY BEFORE MEALS 200 capsule 3   ondansetron (ZOFRAN-ODT) 4 MG disintegrating tablet Take 4 mg by mouth every 8 (eight) hours as needed. (Patient not taking: Reported on 11/16/2022)     prochlorperazine (COMPAZINE) 10 MG tablet  (Patient not taking: Reported on 11/16/2022)     simethicone (GAS-X) 80 MG chewable tablet Chew 1 tablet (80 mg total) by mouth every 8 (eight) hours as needed for flatulence. (Patient not taking: Reported on 11/16/2022) 60 tablet 0   No current facility-administered medications for this visit.   Facility-Administered Medications Ordered in Other Visits  Medication Dose Route Frequency Provider Last Rate Last Admin   heparin lock flush 100 UNIT/ML injection            prochlorperazine (COMPAZINE) 10 MG tablet            sodium chloride flush (NS) 0.9 % injection 10 mL  10 mL Intravenous PRN [name], MD   10 mL at 02/18/21 0858   sodium chloride flush (NS) 0.9 % injection 10 mL  10 mL Intravenous Once Borders, Ivin Booty R, NP       sodium chloride flush (NS) 0.9 % injection 10 mL  10 mL Intravenous Once [name], MD         PHYSICAL EXAMINATION: ECOG PERFORMANCE STATUS: 0 - Asymptomatic Vitals:   02/16/23 1048  BP: 135/80  Pulse: (!) 44  Resp: 18  Temp: 97.7 F (36.5 C)   Filed Weights   02/16/23 1048  Weight: 134 lb 9.6 oz (61.1 kg)    Physical  Exam Constitutional:  General: She is not in acute distress. HENT:     Head: Normocephalic and atraumatic.  Eyes:     General: No scleral icterus. Cardiovascular:     Rate and Rhythm: Normal rate and regular rhythm.     Heart sounds: Normal heart sounds.  Pulmonary:     Effort: Pulmonary effort is normal. No respiratory distress.     Breath sounds: No wheezing.  Abdominal:     General: Bowel sounds are normal. There is no distension.     Palpations: Abdomen is soft.  Musculoskeletal:        General: No deformity. Normal range of motion.     Cervical back: Normal range of motion and neck supple.  Skin:    General: Skin is warm and dry.     Coloration: Skin is not jaundiced.     Comments: Left basal cell carcinoma status post Mohs surgery  Neurological:     Mental Status: She is alert and oriented to person, place, and time. Mental status is at baseline.     Cranial Nerves: No cranial nerve deficit.     Coordination: Coordination normal.  Psychiatric:        Mood and Affect: Mood normal.     LABORATORY DATA:  I have reviewed the data as listed    Latest Ref Rng & Units 02/16/2023   10:19 AM 11/16/2022    9:05 AM 07/27/2022   10:12 AM  CBC  WBC 4.0 - 10.5 K/uL 5.5  4.0  4.3   Hemoglobin 12.0 - 15.0 g/dL 91.4  78.2  95.6   Hematocrit 36.0 - 46.0 % 39.4  38.3  38.4   Platelets 150 - 400 K/uL 184  170  179       Latest Ref Rng & Units 02/16/2023   10:19 AM 11/16/2022    9:05 AM 07/27/2022   10:12 AM  CMP  Glucose 70 - 99 mg/dL 88  71  79   BUN 8 - 23 mg/dL 14  13  14    Creatinine 0.44 - 1.00 mg/dL 2.13  0.86  5.78   Sodium 135 - 145 mmol/L 138  137  137   Potassium 3.5 - 5.1 mmol/L 3.9  3.9  3.9   Chloride 98 - 111 mmol/L 107  103  105   CO2 22 - 32 mmol/L 25  25  24    Calcium 8.9 - 10.3 mg/dL 9.0  8.9  8.7   Total Protein 6.5 - 8.1 g/dL 6.8  6.8  6.7   Total Bilirubin 0.3 - 1.2 mg/dL 0.7  0.6  0.6   Alkaline Phos 38 - 126 U/L 76  75  79   AST 15 - 41 U/L 26  28   31    ALT 0 - 44 U/L 22  22  25       RADIOGRAPHIC STUDIES: I have personally reviewed the radiological images as listed and agreed with the findings in the report. Reviewed findings of MRI abdomen MRCP done at [facility]. MM 3D SCREENING MAMMOGRAM BILATERAL BREAST  Result Date: 01/17/2023 CLINICAL DATA:  Screening. EXAM: DIGITAL SCREENING BILATERAL MAMMOGRAM WITH TOMOSYNTHESIS AND CAD TECHNIQUE: Bilateral screening digital craniocaudal and mediolateral oblique mammograms were obtained. Bilateral screening digital breast tomosynthesis was performed. The images were evaluated with computer-aided detection. COMPARISON:  Previous exam(s). ACR Breast Density Category c: The breasts are heterogeneously dense, which may obscure small masses. FINDINGS: There are no findings suspicious for malignancy. IMPRESSION: No mammographic evidence of malignancy. A result  letter of this screening mammogram will be mailed directly to the patient. RECOMMENDATION: Screening mammogram in one year. (Code:SM-B-01Y) BI-RADS CATEGORY  1: Negative. Electronically Signed   By: Jacob Moores M.D.   On: 01/17/2023 12:06

## 2023-02-16 NOTE — Assessment & Plan Note (Signed)
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  Continue calcium supplementation, 2000 mg daily-divided in 2-3 doses

## 2023-02-16 NOTE — Assessment & Plan Note (Addendum)
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  Pathology proved liver metastatic disease- additional chemotherapy --> whipple procedure.--> adjuvant Gem/Abraxane finished in August 2023. ypT1b ypN0 Labs reviewed and discussed with patient -  12/21/2022 CT chest abdomen pelvis w [facility] showed-No evidence of metastatic disease in the chest, abdomen, or pelvis.  CA 19.9 remains low.  Continue clinical and imaging surveillance.

## 2023-02-16 NOTE — Progress Notes (Signed)
Pt reports that she is having leg cramps, but she has increased water intake and it seems to have helped out.

## 2023-02-16 NOTE — Assessment & Plan Note (Signed)
Continue port flush Q8-10 weeks.

## 2023-02-16 NOTE — Assessment & Plan Note (Signed)
fingertips and toes, grade 1/2.  She did not tolerate Cymbalta.   Stable symptoms. Acupuncture sessions as needed..  

## 2023-02-16 NOTE — Assessment & Plan Note (Signed)
S/p  Whipple procedure..  Continue Creon, continue 16109 units TID with meals and  with her snacks Will check iron tibc ferritin, zinc, copper, vitamin D, magnesium.

## 2023-02-17 LAB — CANCER ANTIGEN 19-9: CA 19-9: 6 U/mL (ref 0–35)

## 2023-02-18 LAB — COPPER, SERUM: Copper: 113 ug/dL (ref 80–158)

## 2023-02-18 LAB — ZINC: Zinc: 71 ug/dL (ref 44–115)

## 2023-05-10 ENCOUNTER — Encounter: Payer: Self-pay | Admitting: Oncology

## 2023-05-23 ENCOUNTER — Other Ambulatory Visit: Payer: Medicare Other

## 2023-05-23 ENCOUNTER — Ambulatory Visit: Payer: Medicare Other | Admitting: Oncology

## 2023-05-24 ENCOUNTER — Telehealth: Payer: Self-pay

## 2023-05-24 NOTE — Transitions of Care (Post Inpatient/ED Visit) (Signed)
05/24/2023  Name: Cassandra [name] MRN: [id number] DOB: 1955-07-01  Today's TOC FU Call Status: Today's TOC FU Call Status:: Successful TOC FU Call Completed TOC FU Call Complete Date: 05/24/23 Patient's Name and Date of Birth confirmed.  Transition Care Management Follow-up Telephone Call Date of Discharge: 05/21/23 (Name added to the list on 05/24/23) Discharge Facility: Other ([facility]) Name of Other ([facility]) Discharge Facility: [facility] Type of Discharge: Inpatient Admission Primary Inpatient Discharge Diagnosis:: Pulmonary Nodule Removal How have you been since you were released from the hospital?: Better (Patient taking Tylenol for pain) Any questions or concerns?: No  Items Reviewed: Did you receive and understand the discharge instructions provided?: Yes Medications obtained,verified, and reconciled?: Yes (Medications Reviewed) Any new allergies since your discharge?: No Dietary orders reviewed?: No Do you have support at home?: Yes People in Home: spouse Name of Support/Comfort Primary Source: Molly Maduro  Medications Reviewed Today: Medications Reviewed Today     Reviewed by Jodelle Gross, RN (Case Manager) on 05/24/23 at 1528  Med List Status: <None>   Medication Order Taking? Sig Documenting Provider Last Dose Status Informant  acetaminophen (TYLENOL) 325 MG tablet [id number] Yes Take by mouth. [provider] Taking Active   Calcium Carbonate-Vit D-Min (CALCIUM 1200 PO) [id number] Yes Take by mouth. [provider] Taking Active   Cholecalciferol 25 MCG (1000 UT) tablet [id number] Yes Take 2,000 Units by mouth daily. [provider] Taking Active Self  clobetasol cream (TEMOVATE) 0.05 % [id number] Yes  [provider] Taking Active   Cyanocobalamin (B-12 PO) [id number] No Take by mouth. [provider] Unknown Active   gabapentin (NEURONTIN) 100 MG capsule [id number] Yes Take 1 capsule by mouth 3 (three) times daily.  [provider] Taking Active   lidocaine-prilocaine (EMLA) cream [id number] No APPLY TO AFFECTED AREA ONCE AS DIRECTED [name], MD Unknown Active   Multiple Vitamins-Minerals (MULTIVITAMIN WITH MINERALS) tablet [id number] Yes Take 1 tablet by mouth daily. [provider] Taking Active   ondansetron (ZOFRAN-ODT) 4 MG disintegrating tablet [id number] No Take 4 mg by mouth every 8 (eight) hours as needed.  Patient not taking: Reported on 11/16/2022   [provider] Not Taking Active   oxyCODONE (OXY IR/ROXICODONE) 5 MG immediate release tablet [id number] No Take by mouth.  Patient not taking: Reported on 05/24/2023   [provider] Not Taking Active            Med Note Electa Sniff [facility]   Tue May 24, 2023  3:28 PM) Pain controlled by Tylenol  Pancrelipase, Lip-Prot-Amyl, (CREON) 24000-76000 units CPEP [id number] Yes TAKE 1 CAPSULE THREE TIMES DAILY BEFORE MEALS [name], MD Taking Active   polyethylene glycol powder (GLYCOLAX/MIRALAX) 17 GM/SCOOP powder [id number] Yes Take by mouth. [provider] Taking Active   prochlorperazine (COMPAZINE) 10 MG tablet [id number] No   Patient not taking: Reported on 11/16/2022   [provider] Not Taking Active   senna-docusate (SENOKOT-S) 8.6-50 MG tablet [id number] Yes Take by mouth. [provider] Taking Active   simethicone (GAS-X) 80 MG chewable tablet [id number] No Chew 1 tablet (80 mg total) by mouth every 8 (eight) hours as needed for flatulence.  Patient not taking: Reported on 11/16/2022   [name], MD Not Taking Active             Home Care and Equipment/Supplies: Were Home Health Services Ordered?: No Any new equipment or medical supplies ordered?: No  Functional Questionnaire: Do you need assistance with bathing/showering or dressing?: No  Do you need assistance with meal preparation?: No Do you need assistance with eating?: No Do you have difficulty maintaining continence: No Do  you need assistance with getting out of bed/getting out of a chair/moving?: No Do you have difficulty managing or taking your medications?: No  Follow up appointments reviewed: PCP Follow-up appointment confirmed?: NA Specialist Hospital Follow-up appointment confirmed?: Yes Date of Specialist follow-up appointment?: 05/31/23 Follow-Up Specialty Provider:: Dr. Rhona Raider ([facility]) Do you need transportation to your follow-up appointment?: No Do you understand care options if your condition(s) worsen?: Yes-patient verbalized understanding  SDOH Interventions Today    Flowsheet Row Most Recent Value  SDOH Interventions   Food Insecurity Interventions Intervention Not Indicated  Housing Interventions Intervention Not Indicated  Transportation Interventions Intervention Not Indicated     Jodelle Gross RN, BSN, CCM RN Care Manager  Transitions of Care  [facility] Population [facility]  [phone]

## 2023-05-30 ENCOUNTER — Inpatient Hospital Stay: Payer: Medicare Other | Admitting: Oncology

## 2023-05-30 ENCOUNTER — Inpatient Hospital Stay: Payer: Medicare Other | Attending: Oncology

## 2023-05-30 ENCOUNTER — Encounter: Payer: Self-pay | Admitting: Oncology

## 2023-05-30 VITALS — BP 137/84 | HR 54 | Temp 97.2°F | Resp 18 | Wt 133.5 lb

## 2023-05-30 DIAGNOSIS — R918 Other nonspecific abnormal finding of lung field: Secondary | ICD-10-CM | POA: Insufficient documentation

## 2023-05-30 DIAGNOSIS — C259 Malignant neoplasm of pancreas, unspecified: Secondary | ICD-10-CM

## 2023-05-30 DIAGNOSIS — K8689 Other specified diseases of pancreas: Secondary | ICD-10-CM

## 2023-05-30 DIAGNOSIS — Z9221 Personal history of antineoplastic chemotherapy: Secondary | ICD-10-CM | POA: Insufficient documentation

## 2023-05-30 DIAGNOSIS — Z803 Family history of malignant neoplasm of breast: Secondary | ICD-10-CM | POA: Diagnosis not present

## 2023-05-30 DIAGNOSIS — Z5111 Encounter for antineoplastic chemotherapy: Secondary | ICD-10-CM | POA: Insufficient documentation

## 2023-05-30 DIAGNOSIS — C250 Malignant neoplasm of head of pancreas: Secondary | ICD-10-CM | POA: Insufficient documentation

## 2023-05-30 DIAGNOSIS — Z5189 Encounter for other specified aftercare: Secondary | ICD-10-CM | POA: Diagnosis not present

## 2023-05-30 DIAGNOSIS — Z95828 Presence of other vascular implants and grafts: Secondary | ICD-10-CM

## 2023-05-30 DIAGNOSIS — G620 Drug-induced polyneuropathy: Secondary | ICD-10-CM | POA: Diagnosis not present

## 2023-05-30 DIAGNOSIS — Z79899 Other long term (current) drug therapy: Secondary | ICD-10-CM | POA: Insufficient documentation

## 2023-05-30 DIAGNOSIS — T451X5A Adverse effect of antineoplastic and immunosuppressive drugs, initial encounter: Secondary | ICD-10-CM

## 2023-05-30 DIAGNOSIS — C787 Secondary malignant neoplasm of liver and intrahepatic bile duct: Secondary | ICD-10-CM | POA: Insufficient documentation

## 2023-05-30 DIAGNOSIS — Z800 Family history of malignant neoplasm of digestive organs: Secondary | ICD-10-CM | POA: Diagnosis not present

## 2023-05-30 LAB — CBC WITH DIFFERENTIAL (CANCER CENTER ONLY)
Abs Immature Granulocytes: 0.01 10*3/uL (ref 0.00–0.07)
Basophils Absolute: 0.1 10*3/uL (ref 0.0–0.1)
Basophils Relative: 1 %
Eosinophils Absolute: 0.1 10*3/uL (ref 0.0–0.5)
Eosinophils Relative: 2 %
HCT: 37.8 % (ref 36.0–46.0)
Hemoglobin: 13.4 g/dL (ref 12.0–15.0)
Immature Granulocytes: 0 %
Lymphocytes Relative: 29 %
Lymphs Abs: 1.7 10*3/uL (ref 0.7–4.0)
MCH: 33.6 pg (ref 26.0–34.0)
MCHC: 35.4 g/dL (ref 30.0–36.0)
MCV: 94.7 fL (ref 80.0–100.0)
Monocytes Absolute: 0.6 10*3/uL (ref 0.1–1.0)
Monocytes Relative: 10 %
Neutro Abs: 3.4 10*3/uL (ref 1.7–7.7)
Neutrophils Relative %: 58 %
Platelet Count: 213 10*3/uL (ref 150–400)
RBC: 3.99 MIL/uL (ref 3.87–5.11)
RDW: 11.4 % — ABNORMAL LOW (ref 11.5–15.5)
WBC Count: 5.8 10*3/uL (ref 4.0–10.5)
nRBC: 0 % (ref 0.0–0.2)

## 2023-05-30 LAB — CMP (CANCER CENTER ONLY)
ALT: 25 U/L (ref 0–44)
AST: 25 U/L (ref 15–41)
Albumin: 3.8 g/dL (ref 3.5–5.0)
Alkaline Phosphatase: 72 U/L (ref 38–126)
Anion gap: 10 (ref 5–15)
BUN: 12 mg/dL (ref 8–23)
CO2: 25 mmol/L (ref 22–32)
Calcium: 8.5 mg/dL — ABNORMAL LOW (ref 8.9–10.3)
Chloride: 102 mmol/L (ref 98–111)
Creatinine: 0.56 mg/dL (ref 0.44–1.00)
GFR, Estimated: >60 mL/min (ref >60)
Glucose, Bld: 76 mg/dL (ref 70–99)
Potassium: 3.7 mmol/L (ref 3.5–5.1)
Sodium: 137 mmol/L (ref 135–145)
Total Bilirubin: 0.5 mg/dL (ref <1.2)
Total Protein: 6.5 g/dL (ref 6.5–8.1)

## 2023-05-30 MED ORDER — HEPARIN SOD (PORK) LOCK FLUSH 100 UNIT/ML IV SOLN
500.0000 [IU] | Freq: Once | INTRAVENOUS | Status: AC
  Administered 2023-05-30: 500 [IU] via INTRAVENOUS
  Filled 2023-05-30: qty 5

## 2023-05-30 MED ORDER — SODIUM CHLORIDE 0.9% FLUSH
10.0000 mL | Freq: Once | INTRAVENOUS | Status: AC
  Administered 2023-05-30: 10 mL via INTRAVENOUS
  Filled 2023-05-30: qty 10

## 2023-05-30 NOTE — Progress Notes (Signed)
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  Pathology proved liver metastatic disease- additional chemotherapy --> whipple procedure.--> adjuvant Gem/Abraxane finished in August 2023. ypT1b ypN0 Labs reviewed and discussed with patient -  CA 19.9 remains low. CEA is slightly elevated.  Wedge resection pathology is pending.  Additional plan pending path results.   Chemotherapy-induced neuropathy (HCC) fingertips and toes, grade 1/2.  She did not tolerate Cymbalta.   Stable symptoms. Acupuncture sessions as needed..   Pancreatic insufficiency S/p  Whipple procedure..  Continue Creon, continue 56213 units TID with meals and  with her snacks Will check iron tibc ferritin, zinc, copper, vitamin D, magnesium.   Hypocalcemia Likely due to malabsorption. Continue calcium supplementation, 2000 mg daily-divided in 2-3 doses   Port-A-Cath in place Continue port flush Q6-8 weeks.      No orders of the defined types were placed in this encounter.   Follow up TBD    All questions were answered. The patient knows to call the clinic with any problems, questions or concerns.  [name], MD, PhD [facility] Hematology Oncology 05/30/2023   Erasmo Downer, MD    HISTORY OF PRESENTING ILLNESS:   Cassandra [name] is a  67 y.o.  female with presents for follow up of Stage IV pancreatic adenocarcinoma Patient initially presented with jaundice, transaminitis, bilirubin  was 9.9.  CA 19-9 was 1874.  Patient also reports unintentional weight loss. 02/08/2020 MRI abdomen and MRCP with and without contrast was done at [facility] which showed pancreatic head mass measuring up to 3 cm, with marked associated narrowing of the portal vein confluence.  SMA is preserved.  Marked intrahepatic and extrahepatic biliary duct dilatation as well as mild dilatation of the main pancreatic duct. Multiple hepatic masses highly concerning for metastatic disease.  Patient underwent EUS on 02/19/2020, which showed irregular mass identified in the pancreatic head, hypoechoic, measured 29mmx33mm, sonographic evidence concerning for invasion into the superior mesenteric artery.  There is no sign of significant abnormality in the main pancreatic duct.  Dilatation of common bile duct which measured up to 16 mm.  Region of celiac artery was visualized and showed no signs of significant abnormality.  No lymphadenopathy.  FNA showed adenocarcinoma.  02/19/2020, ERCP, malignant.  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  The stricture was treated with placement of wall flex metal stent.  Patient was seen by [facility] oncology Dr. Olga Millers and was recommended for 3 drug regimen FOLFIRINOX.  Patient prefers to do chemotherapy locally at [facility].  Patient was referred to establish care today. She denies any pain.  Since stent placement, skin jaundice has improved.  Itchiness has also improved. Patient was accompanied by her husband today.  She has a family history of breast cancer in sister and paternal aunt, colon cancer paternal grandmother.  #No reportable targetable mutation on NGS 9/14/2021cycle 1 FOLFIRINOX.  Patient received oxaliplatin and about 50% of Irinotecan on day 1 and had experienced neurologic symptoms.  She went to ER and working  diagnosis is TIA, and eventually I think this is due to irinotecan side effects -irinotecan-associated  dysarthria, lip/tongue numbness.  Adjustment was made for Irinotecan to be  infused over 180 minutes.  Atropine 0.5 mg once prior to the irinotecan. No recurrent symptoms.   # 03/19/2020-03/21/2020 patient was admitted due to sepsis with strep pneumonia bacteremia.  Patient was treated with IV Rocephin.  TEE was done which showed no vegetation.  No PFO or ASD.  Patient was discharged home and he finished full course of 14 days of IV Rocephin on 04/02/2020 per ID recommendation.  Repeat blood culture was also negative.  08/25/20 cycle 10 FOLFIRINOX 09/08/20- present  Starting cycle 11, FOLFIRI, Oxaliplatin discontinued due to neuropathy   #NGS showed no reportable targetable mutation #Genetic testing-[employer] diagnostic testing showed no pathological variants identified. MS stable, TMB 21mut/mb, KRAS G12D, SF3B1 K700E, TP53 V279fs*30  #01/14/2021  CT done at [facility] during the interval, stable disease.  # Her case was presented by Dr.Jia from [facility] tumor board. Clayborne Artist were 4~5 liver lesions on OSH MRI last year at the time of diagnosis highly suspicious for metastasis. She is not eligible for surgical protocol for metastasis resection given the number of liver metastasis is >3. However given her excellent and durable response to chemo, surgical resection may be considered pending sustained disease control at 1 year and may require partial hepatectomy first to see if there is viable tumor before proceeding to Whipple resection ]  Patient had a COVID-19 infection in September 2022.  03/23/2021 CT abdomen pelvis No significant change in the ill-defined pancreatic head mass or  associated biliary ductal and pancreatic ductal dilation.  Slight interval enlargement of subcentimeter anterior peripancreatic  lymph node, nonspecific. Attention on follow-up per clinical protocol. Similar enlargement of the ascending thoracic aorta measuring 4.4 cm  03/23/2021 MRI abdomen w and wo  Increasing ill-defined  hypoenhancement of the pancreatic head surrounding the common bile duct stent, in keeping with known pancreatic malignancy. Anterior peripancreatic lymph node is better evaluated on CT.  Unchanged position of a common bile duct stent with similar mild diffuse intrahepatic ductal dilation and pneumobilia. No evidence of metastatic disease in the abdomen or pelvis. Partially visualized cystic left adnexal lesion measuring up to 3.9 cm.   04/10/2021, patient underwent liver resection at [facility], by Dr. [name] Ducking.   Pathology report from [facility] was reviewed. Segments 3 partial hepatectomy, negative for viable tumor. Section 5/6 partial hepatectomy part 1 and 2, microscopic foci of residual viable adenocarcinoma, morphologically consistent with history of pancreatic primary.  Parenchymal margin is uninvolved.  #Patient resumed on FOLFIRI on 04/28/2021. She got another cycle on 05/12/2021 #05/26/2021, patient did not get additional chemotherapy due to transaminitis.  Shared decision was made to stop FOLFIRI and switch to gemcitabine Abraxane treatments.  05/26/2021, patient developed transaminitis, AST 763, ALT 691, alkaline phosphatase 364.  Bilirubin 0.7 05/26/2021 stat ultrasound abdomen right upper quadrant showed interval development of 3.3 x 1.8 cm complex mass in the right hepatic lobe.  Increased extrahepatic ductal dilatation.  Concerning for CBD obstruction. 05/29/2021, MRI abdomen MRCP with and without contrast showed unchanged pancreatic head soft tissue.  Severe intra and extrahepatic biliary ductal dilatation.  Common bile duct stents remain in position however patency is not established.  No evidence of lymphadenopathy or metastatic disease in the abdomen.  With further communication with radiologist, addendum was added that there is internal fluid signal and no appreciable associated contrast enhancement measuring 3.4 x 2.3 cm.  This appearance  is generally not consistent with metastasis and is of  uncertain significance.  Possibly reflecting hepatic abscess or residual of subcapsular hematoma.  Patient was seen by [facility] Dr. Elon Jester team.  He had ERCP done on 06/05/2021, with findings of One stent from the biliary tree was seen in the major papilla. The stent had migrated significantly into the duodenum. This is the cause of stent malfunction. One stent was removed from the biliary tree. Prior biliary sphincterotomy appeared open. - A single severe biliary stricture was found in the lower third of the main bile duct with upstream dilation. The stricture was alignant appearing.- The biliary tree was swept and sludge was found.- One fully covered metal stent was placed into the common bile duct across the stricture.- No pancreatogram performed.  06/12/2021-08/10/2021 gemcitabine and Abraxane.   10/14/2021, patient underwent Whipple procedure. Liver biopsy negative for malignancy. Review procedure showed invasive adenocarcinoma, moderately differentiated, centered in pancreatic head and confined in the Pancreas. Surgical margin is negative for malignancy.  36 lymph nodes were all negative for malignancy.  1 hepatic artery lymph node was harvested and was negative.  Gallbladder negative for malignancy.  Cystic duct excision negative for malignancy. pT1b pN0  10/25/2021, CT abdomen pelvis with contrast showed rim-enhancing fluid collection in the region of the hepatic hilum. Patient had a readmission for superficial wound infection.  She completed antibiotics on 11/03/2021.  11/03/2021, CT abdomen pelvis with contrast showed near resolution of previously seen fluid collection in the region of the hepatic hilum.  Similar appearance of the area of hypoenhancement in the liver.  Attention on follow-up.  11/17/2021, resumed on gemcitabine/Abraxane. 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  12/12/ 2023 CT scan at [facility] shows no evidence of cancer recurrence.-NED 09/21/2022 CT chest abdomen pelvis w  contrast at [facility] showed-No evidence of metastatic disease in the chest, abdomen, or pelvis. 12/21/2022 CT chest abdomen pelvis w contrast at [facility] showed-No evidence of metastatic disease in the chest, abdomen, or pelvis.   INTERVAL HISTORY Cassandra [name] is a 67 y.o. female who has above history reviewed by me today presents for pancreatic cancer.  She is doing well clinically.  03/22/2023 CT scan at [facility] showed enlarging lung nodules. RLL 1cm, LLL 0.5cm, RUL 0.5cm Stable hypoattenuating right inferior hepatic lobe lesion measuring 0.9 cm.  Similar left adnexal cystic lesion measuring up to 5.0 cm.   04/06/2023 s/p lung nodule biopsy.  Rare atypical epithelioid cells present with evidence suggestive of invasion, suspicious but not diagnostic for malignancy.  05/20/2023 s/p wedge biopsy pathology is pending.   Patient has no new complaints.  Weight has been stable.  She takes calcium supplementation as instructed.  She take Creon for pancreatic insufficiency.    Review of Systems  Constitutional:  Negative for appetite change, chills, fatigue, fever and unexpected weight change.  HENT:   Negative for hearing loss and voice change.   Eyes:  Negative for eye problems.  Respiratory:  Negative for chest tightness and cough.   Cardiovascular:  Negative for chest pain.  Gastrointestinal:  Negative for abdominal distention, abdominal pain, blood in stool and nausea.  Endocrine: Negative for hot flashes.  Genitourinary:  Negative for difficulty urinating and frequency.   Musculoskeletal:  Negative for arthralgias.       Leg cramps  Skin:  Negative for itching and rash.  Neurological:  Positive for numbness. Negative for extremity weakness.  Hematological:  Negative for adenopathy.  Psychiatric/Behavioral:  Negative for confusion.  MEDICAL HISTORY:  Past Medical History:  Diagnosis Date   Allergy    Anemia    Cancer (HCC)    pancreatic cancer   Colon polyps    Family history of breast  cancer    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   Neutropenia (HCC) 04/18/2020   Osteopenia after menopause 05/2017   femoral neck T score -2.0   Personal history of chemotherapy    current for pancreatic ca   PONV (postoperative nausea and vomiting)    Pure hypercholesterolemia     SURGICAL HISTORY: Past Surgical History:  Procedure Laterality Date   COLONOSCOPY  07/2015   WNL   COLONOSCOPY  2008/2011   COLONOSCOPY WITH PROPOFOL N/A 07/10/2021   Procedure: COLONOSCOPY WITH PROPOFOL;  Surgeon: Regis Bill, MD;  Location: [facility] ENDOSCOPY;  Service: Endoscopy;  Laterality: N/A;   IR CV LINE INJECTION  04/28/2021   OVARIAN CYST REMOVAL  1992   dermoid-Dr CAK   PORTA CATH INSERTION N/A 03/07/2020   Procedure: PORTA CATH INSERTION;  Surgeon: Annice Needy, MD;  Location: [facility] INVASIVE CV LAB;  Service: Cardiovascular;  Laterality: N/A;   TEE WITHOUT CARDIOVERSION N/A 03/21/2020   Procedure: TRANSESOPHAGEAL ECHOCARDIOGRAM (TEE);  Surgeon: Laurier Nancy, MD;  Location: [facility] ORS;  Service: Cardiovascular;  Laterality: N/A;   TUBAL LIGATION  1993    SOCIAL HISTORY: Social History   Socioeconomic History   Marital status: Married    Spouse name: Not on file   Number of children: 2   Years of education: Not on file   Highest education level: Not on file  Occupational History   Occupation: Teacher    Comment: retired   Occupation: Visual merchandiser  Tobacco Use   Smoking status: Former    Current packs/day: 0.00    Average packs/day: 1 pack/day for 10.0 years (10.0 ttl pk-yrs)    Types: Cigarettes    Start date: 06/29/1975    Quit date: 06/28/1985    Years since quitting: 37.9   Smokeless tobacco: Never   Tobacco comments:    Quit smoking 1987  Vaping Use   Vaping status: Never Used  Substance and Sexual Activity   Alcohol use: Yes    Alcohol/week: 5.0 standard drinks of alcohol    Types: 5 Glasses of wine per week    Comment: 0-2 mixed drinks a day   Drug use: No    Sexual activity: Not Currently    Birth control/protection: Post-menopausal  Other Topics Concern   Not on file  Social History Narrative   Not on file   Social Determinants of Health   Financial Resource Strain: Low Risk  (05/20/2023)   Received from [facility]   Overall Financial Resource Strain (CARDIA)    Difficulty of Paying Living Expenses: Not hard at all  Food Insecurity: No Food Insecurity (05/24/2023)   Hunger Vital Sign    Worried About Running Out of Food in the Last Year: Never true    Ran Out of Food in the Last Year: Never true  Transportation Needs: No Transportation Needs (05/24/2023)   PRAPARE - Administrator, Civil Service (Medical): No    Lack of Transportation (Non-Medical): No  Physical Activity: Insufficiently Active (10/09/2022)   Exercise Vital Sign    Days of Exercise per Week: 4 days    Minutes of Exercise per Session: 30 min  Stress: No Stress Concern Present (10/09/2022)   [employer] of Occupational  Health - Occupational Stress Questionnaire    Feeling of Stress : Not at all  Social Connections: Unknown (10/09/2022)   Social Connection and Isolation Panel [NHANES]    Frequency of Communication with Friends and Family: More than three times a week    Frequency of Social Gatherings with Friends and Family: More than three times a week    Attends Religious Services: Not on Marketing executive or Organizations: No    Attends Banker Meetings: Never    Marital Status: Married  Catering manager Violence: Not At Risk (05/24/2023)   Humiliation, Afraid, Rape, and Kick questionnaire    Fear of Current or Ex-Partner: No    Emotionally Abused: No    Physically Abused: No    Sexually Abused: No    FAMILY HISTORY: Family History  Problem Relation Age of Onset   Breast cancer Paternal Aunt 9   Diabetes Mother    Osteoporosis Mother    Hyperlipidemia Father    Rheumatic fever Father     Valvular heart disease Father    Cancer Paternal Grandmother        possible colon   Breast cancer Sister 29    ALLERGIES:  is allergic to penicillin g.  MEDICATIONS:  Current Outpatient Medications  Medication Sig Dispense Refill   acetaminophen (TYLENOL) 325 MG tablet Take by mouth.     Calcium Carbonate-Vit D-Min (CALCIUM 1200 PO) Take by mouth.     Cholecalciferol 25 MCG (1000 UT) tablet Take 2,000 Units by mouth daily.     clobetasol cream (TEMOVATE) 0.05 %      Cyanocobalamin (B-12 PO) Take by mouth.     gabapentin (NEURONTIN) 100 MG capsule Take 1 capsule by mouth 3 (three) times daily.     lidocaine-prilocaine (EMLA) cream APPLY TO AFFECTED AREA ONCE AS DIRECTED 30 g 6   Multiple Vitamins-Minerals (MULTIVITAMIN WITH MINERALS) tablet Take 1 tablet by mouth daily.     Pancrelipase, Lip-Prot-Amyl, (CREON) 24000-76000 units CPEP TAKE 1 CAPSULE THREE TIMES DAILY BEFORE MEALS 200 capsule 3   polyethylene glycol powder (GLYCOLAX/MIRALAX) 17 GM/SCOOP powder Take by mouth.     senna-docusate (SENOKOT-S) 8.6-50 MG tablet Take by mouth.     ondansetron (ZOFRAN-ODT) 4 MG disintegrating tablet Take 4 mg by mouth every 8 (eight) hours as needed. (Patient not taking: Reported on 11/16/2022)     prochlorperazine (COMPAZINE) 10 MG tablet  (Patient not taking: Reported on 11/16/2022)     simethicone (GAS-X) 80 MG chewable tablet Chew 1 tablet (80 mg total) by mouth every 8 (eight) hours as needed for flatulence. (Patient not taking: Reported on 11/16/2022) 60 tablet 0   No current facility-administered medications for this visit.   Facility-Administered Medications Ordered in Other Visits  Medication Dose Route Frequency Provider Last Rate Last Admin   heparin lock flush 100 UNIT/ML injection            prochlorperazine (COMPAZINE) 10 MG tablet            sodium chloride flush (NS) 0.9 % injection 10 mL  10 mL Intravenous PRN [name], MD   10 mL at 02/18/21 0858   sodium chloride flush (NS) 0.9  % injection 10 mL  10 mL Intravenous Once Borders, Ivin Booty R, NP       sodium chloride flush (NS) 0.9 % injection 10 mL  10 mL Intravenous Once [name], MD         PHYSICAL EXAMINATION: ECOG  PERFORMANCE STATUS: 0 - Asymptomatic Vitals:   05/30/23 1042 05/30/23 1050  BP: (!) 146/89 137/84  Pulse: (!) 54   Resp: 18   Temp: (!) 97.2 F (36.2 C)   SpO2: 100%    Filed Weights   05/30/23 1042  Weight: 133 lb 8 oz (60.6 kg)    Physical Exam Constitutional:      General: She is not in acute distress. HENT:     Head: Normocephalic and atraumatic.  Eyes:     General: No scleral icterus. Cardiovascular:     Rate and Rhythm: Normal rate and regular rhythm.     Heart sounds: Normal heart sounds.  Pulmonary:     Effort: Pulmonary effort is normal. No respiratory distress.  Abdominal:     General: Bowel sounds are normal. There is no distension.     Palpations: Abdomen is soft.  Musculoskeletal:        General: No deformity. Normal range of motion.     Cervical back: Normal range of motion and neck supple.  Skin:    General: Skin is warm.     Coloration: Skin is not jaundiced.     Findings: No erythema.  Neurological:     Mental Status: She is alert and oriented to person, place, and time. Mental status is at baseline.  Psychiatric:        Mood and Affect: Mood normal.     LABORATORY DATA:  I have reviewed the data as listed    Latest Ref Rng & Units 05/30/2023   10:25 AM 02/16/2023   10:19 AM 11/16/2022    9:05 AM  CBC  WBC 4.0 - 10.5 K/uL 5.8  5.5  4.0   Hemoglobin 12.0 - 15.0 g/dL 09.8  11.9  14.7   Hematocrit 36.0 - 46.0 % 37.8  39.4  38.3   Platelets 150 - 400 K/uL 213  184  170       Latest Ref Rng & Units 05/30/2023   10:25 AM 02/16/2023   10:19 AM 11/16/2022    9:05 AM  CMP  Glucose 70 - 99 mg/dL 76  88  71   BUN 8 - 23 mg/dL 12  14  13    Creatinine 0.44 - 1.00 mg/dL 8.29  5.62  1.30   Sodium 135 - 145 mmol/L 137  138  137   Potassium 3.5 - 5.1 mmol/L 3.7   3.9  3.9   Chloride 98 - 111 mmol/L 102  107  103   CO2 22 - 32 mmol/L 25  25  25    Calcium 8.9 - 10.3 mg/dL 8.5  9.0  8.9   Total Protein 6.5 - 8.1 g/dL 6.5  6.8  6.8   Total Bilirubin <1.2 mg/dL 0.5  0.7  0.6   Alkaline Phos 38 - 126 U/L 72  76  75   AST 15 - 41 U/L 25  26  28    ALT 0 - 44 U/L 25  22  22       RADIOGRAPHIC STUDIES: I have personally reviewed the radiological images as listed and agreed with the findings in the report. Reviewed findings of MRI abdomen MRCP done at [facility]. No results found.

## 2023-05-30 NOTE — Assessment & Plan Note (Addendum)
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  Pathology proved liver metastatic disease- additional chemotherapy --> whipple procedure.--> adjuvant Gem/Abraxane finished in August 2023. ypT1b ypN0 Labs reviewed and discussed with patient -  CA 19.9 remains low. CEA is slightly elevated.  Wedge resection pathology is pending.  Additional plan pending path results.

## 2023-05-30 NOTE — Assessment & Plan Note (Signed)
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 Continue calcium supplementation, 2000 mg daily-divided in 2-3 doses

## 2023-05-30 NOTE — Assessment & Plan Note (Signed)
Continue port flush Q6-8 weeks.

## 2023-05-30 NOTE — Assessment & Plan Note (Signed)
fingertips and toes, grade 1/2.  She did not tolerate Cymbalta.   Stable symptoms. Acupuncture sessions as needed..  

## 2023-05-30 NOTE — Assessment & Plan Note (Signed)
S/p  Whipple procedure..  Continue Creon, continue 16109 units TID with meals and  with her snacks Will check iron tibc ferritin, zinc, copper, vitamin D, magnesium.

## 2023-05-31 LAB — CANCER ANTIGEN 19-9: CA 19-9: 9 U/mL (ref 0–35)

## 2023-06-01 ENCOUNTER — Other Ambulatory Visit: Payer: Self-pay | Admitting: Oncology

## 2023-06-01 ENCOUNTER — Encounter: Payer: Self-pay | Admitting: Oncology

## 2023-06-01 DIAGNOSIS — C259 Malignant neoplasm of pancreas, unspecified: Secondary | ICD-10-CM

## 2023-06-01 MED ORDER — ONDANSETRON HCL 8 MG PO TABS: 8.0000 mg | ORAL_TABLET | Freq: Three times a day (TID) | ORAL | 1 refills | Status: DC | PRN

## 2023-06-01 MED ORDER — PROCHLORPERAZINE MALEATE 10 MG PO TABS: 10.0000 mg | ORAL_TABLET | Freq: Four times a day (QID) | ORAL | 1 refills | Status: DC | PRN

## 2023-06-02 ENCOUNTER — Other Ambulatory Visit: Payer: Self-pay

## 2023-06-02 NOTE — Telephone Encounter (Signed)
Received Call back from [facility] at Dr. Lovena Neighbours office. He states that she discussed with Meda Klinefelter, NP (he saw her a few days ago) and from surgical standpoint pt is ok to proceed with chemo on 12/20. Pt has been scheduled and notified of appt.

## 2023-06-13 ENCOUNTER — Encounter: Payer: Self-pay | Admitting: Oncology

## 2023-06-14 ENCOUNTER — Encounter: Payer: Self-pay | Admitting: Oncology

## 2023-06-17 ENCOUNTER — Inpatient Hospital Stay: Payer: Medicare Other

## 2023-06-17 ENCOUNTER — Telehealth: Payer: Self-pay

## 2023-06-17 ENCOUNTER — Inpatient Hospital Stay
Admission: RE | Admit: 2023-06-17 | Discharge: 2023-06-17 | Disposition: A | Discharge status: Home / Self Care | Payer: Self-pay | Source: Ambulatory Visit | Attending: Oncology | Admitting: Oncology

## 2023-06-17 ENCOUNTER — Inpatient Hospital Stay (HOSPITAL_BASED_OUTPATIENT_CLINIC_OR_DEPARTMENT_OTHER): Payer: Medicare Other | Admitting: Oncology

## 2023-06-17 ENCOUNTER — Encounter: Payer: Self-pay | Admitting: Oncology

## 2023-06-17 VITALS — BP 123/78 | HR 58 | Temp 98.4°F | Resp 18 | Ht 67.0 in | Wt 134.3 lb

## 2023-06-17 DIAGNOSIS — G620 Drug-induced polyneuropathy: Secondary | ICD-10-CM

## 2023-06-17 DIAGNOSIS — K8689 Other specified diseases of pancreas: Secondary | ICD-10-CM | POA: Diagnosis not present

## 2023-06-17 DIAGNOSIS — C259 Malignant neoplasm of pancreas, unspecified: Secondary | ICD-10-CM

## 2023-06-17 DIAGNOSIS — T451X5A Adverse effect of antineoplastic and immunosuppressive drugs, initial encounter: Secondary | ICD-10-CM

## 2023-06-17 DIAGNOSIS — Z5111 Encounter for antineoplastic chemotherapy: Secondary | ICD-10-CM | POA: Diagnosis not present

## 2023-06-17 LAB — CMP (CANCER CENTER ONLY)
ALT: 19 U/L (ref 0–44)
AST: 24 U/L (ref 15–41)
Albumin: 4 g/dL (ref 3.5–5.0)
Alkaline Phosphatase: 66 U/L (ref 38–126)
Anion gap: 10 (ref 5–15)
BUN: 12 mg/dL (ref 8–23)
CO2: 23 mmol/L (ref 22–32)
Calcium: 8.8 mg/dL — ABNORMAL LOW (ref 8.9–10.3)
Chloride: 105 mmol/L (ref 98–111)
Creatinine: 0.78 mg/dL (ref 0.44–1.00)
GFR, Estimated: >60 mL/min (ref >60)
Glucose, Bld: 161 mg/dL — ABNORMAL HIGH (ref 70–99)
Potassium: 3.8 mmol/L (ref 3.5–5.1)
Sodium: 138 mmol/L (ref 135–145)
Total Bilirubin: 0.5 mg/dL (ref <1.2)
Total Protein: 6.4 g/dL — ABNORMAL LOW (ref 6.5–8.1)

## 2023-06-17 LAB — CBC WITH DIFFERENTIAL (CANCER CENTER ONLY)
Abs Immature Granulocytes: 0.01 10*3/uL (ref 0.00–0.07)
Basophils Absolute: 0 10*3/uL (ref 0.0–0.1)
Basophils Relative: 1 %
Eosinophils Absolute: 0.1 10*3/uL (ref 0.0–0.5)
Eosinophils Relative: 3 %
HCT: 37.2 % (ref 36.0–46.0)
Hemoglobin: 12.9 g/dL (ref 12.0–15.0)
Immature Granulocytes: 0 %
Lymphocytes Relative: 28 %
Lymphs Abs: 1.2 10*3/uL (ref 0.7–4.0)
MCH: 33.3 pg (ref 26.0–34.0)
MCHC: 34.7 g/dL (ref 30.0–36.0)
MCV: 96.1 fL (ref 80.0–100.0)
Monocytes Absolute: 0.4 10*3/uL (ref 0.1–1.0)
Monocytes Relative: 9 %
Neutro Abs: 2.6 10*3/uL (ref 1.7–7.7)
Neutrophils Relative %: 59 %
Platelet Count: 178 10*3/uL (ref 150–400)
RBC: 3.87 MIL/uL (ref 3.87–5.11)
RDW: 11.7 % (ref 11.5–15.5)
WBC Count: 4.4 10*3/uL (ref 4.0–10.5)
nRBC: 0 % (ref 0.0–0.2)

## 2023-06-17 MED ORDER — GEMCITABINE HCL CHEMO INJECTION 1 GM/26.3ML
1000.0000 mg/m2 | Freq: Once | INTRAVENOUS | Status: AC
  Administered 2023-06-17: 1710 mg via INTRAVENOUS
  Filled 2023-06-17: qty 44.97

## 2023-06-17 MED ORDER — HEPARIN SOD (PORK) LOCK FLUSH 100 UNIT/ML IV SOLN
500.0000 [IU] | Freq: Once | INTRAVENOUS | Status: AC | PRN
Start: 2023-06-17 — End: 2023-06-17
  Administered 2023-06-17: 500 [IU]
  Filled 2023-06-17: qty 5

## 2023-06-17 MED ORDER — PROCHLORPERAZINE MALEATE 10 MG PO TABS
10.0000 mg | ORAL_TABLET | Freq: Once | ORAL | Status: AC
  Administered 2023-06-17: 10 mg via ORAL
  Filled 2023-06-17: qty 1

## 2023-06-17 MED ORDER — SODIUM CHLORIDE 0.9 % IV SOLN
INTRAVENOUS | Status: DC
  Filled 2023-06-17: qty 250

## 2023-06-17 MED ORDER — PACLITAXEL PROTEIN-BOUND CHEMO INJECTION 100 MG
100.0000 mg/m2 | Freq: Once | INTRAVENOUS | Status: AC
Start: 2023-06-17 — End: 2023-06-17
  Administered 2023-06-17: 175 mg via INTRAVENOUS
  Filled 2023-06-17: qty 35

## 2023-06-17 NOTE — Progress Notes (Signed)
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  Pathology proved liver metastatic disease- additional chemotherapy --> whipple procedure.ypT1b ypN0--> adjuvant Gem/Abraxane finished in August 2023--> Sept 2024 CT showed progression in lung --> Nov 2024 Wedge biopsy confirmed adenocarcinoma with pancreatic origin  Labs reviewed and discussed with patient -  CA 19.9 remains low. CEA is slightly elevated- chronically Pathology was reviewed and discussed with patient. Recurrent pancreatic cancer with lung metastasis.  Recommend patient to resume systemic chemotherapy with Gemcitabine and Abraxane.  Based on her previous experience, Abraxane dose reduced to 1000mg /m2, and 2 weeks on 1 week off schedule.  Patient agrees with plan.   Chemotherapy-induced neuropathy (HCC) fingertips and toes, grade 1/2.  She did not tolerate Cymbalta.   Stable symptoms. Acupuncture sessions as needed..   Hypocalcemia Likely due to malabsorption. Continue calcium supplementation, 2000 mg daily-divided in 2-3 doses   Pancreatic insufficiency S/p  Whipple procedure..  Continue Creon, continue 69629 units TID with meals and  with her snacks   Encounter for antineoplastic chemotherapy Chemotherapy plan as listed above   Orders Placed This Encounter  Procedures   CT CHEST ABDOMEN PELVIS W CONTRAST    Standing Status:   Future    Expected Date:   06/27/2023    Expiration Date:   06/16/2024     If indicated for the ordered procedure, I authorize the administration of contrast media per Radiology protocol:   Yes    Does the patient have a contrast media/X-ray dye allergy?:   No    Preferred imaging location?:   [facility]    If indicated for the ordered procedure, I authorize the administration of oral contrast media per Radiology protocol:   Yes   CBC with Differential (Cancer Center Only)    Standing Status:   Future    Expected Date:   07/11/2023    Expiration Date:   07/10/2024   CMP (Cancer Center only)    Standing Status:   Future    Expected Date:   07/11/2023    Expiration Date:   07/10/2024   CBC with Differential (Cancer Center Only)    Standing Status:   Future    Expected Date:   07/18/2023    Expiration Date:   07/17/2024   CMP (Cancer Center only)    Standing Status:   Future    Expected Date:   07/18/2023    Expiration Date:   07/17/2024    Follow up  1 week lab covering MD Gemcitabine Abraxane.  3 weeks lab MD Gemcitaibine Abraxane    All questions were answered. The patient knows to call the clinic with any problems, questions or concerns.  Cassandra Patience, MD, PhD [name] Oncology 06/17/2023   Cassandra Patience, MD    HISTORY OF PRESENTING ILLNESS:   [name] is a  67 y.o.  female with presents for follow up of Stage IV pancreatic adenocarcinoma Patient initially presented with jaundice, transaminitis, bilirubin was 9.9.  CA 19-9 was 1874.  Patient also reports unintentional weight loss. 02/08/2020 MRI abdomen  and MRCP with and without contrast was done at [facility] which showed pancreatic head mass measuring up to 3 cm, with marked associated narrowing of the portal vein confluence.  SMA is preserved.  Marked intrahepatic and extrahepatic biliary duct dilatation as well as mild dilatation of the main pancreatic duct. Multiple hepatic masses highly concerning for metastatic disease.  Patient underwent EUS on 02/19/2020, which showed irregular mass  identified in the pancreatic head, hypoechoic, measured 78mmx33mm, sonographic evidence concerning for invasion into the superior mesenteric artery.  There is no sign of significant abnormality in the main pancreatic duct.  Dilatation of common bile duct which measured up to 16 mm.  Region of celiac artery was visualized and showed no signs of significant abnormality.  No lymphadenopathy.  FNA showed adenocarcinoma.  02/19/2020, ERCP, malignant.  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  The stricture was treated with placement of wall flex metal stent.  Patient was seen by [name] oncology Dr. Olga Millers and was recommended for 3 drug regimen FOLFIRINOX.  Patient prefers to do chemotherapy locally at [facility].  Patient was referred to establish care today. She denies any pain.  Since stent placement, skin jaundice has improved.  Itchiness has also improved. Patient was accompanied by her husband today.  She has a family history of breast cancer in sister and paternal aunt, colon cancer paternal grandmother.  #No reportable targetable mutation on NGS 9/14/2021cycle 1 FOLFIRINOX.  Patient received oxaliplatin and about 50% of Irinotecan on day 1 and had experienced neurologic symptoms.  She went to ER and working diagnosis is TIA, and eventually I think this is due to irinotecan side effects -irinotecan-associated dysarthria, lip/tongue numbness.  Adjustment was made for Irinotecan to be  infused over 180 minutes.  Atropine 0.5 mg once prior to the irinotecan. No recurrent symptoms.   # 03/19/2020-03/21/2020 patient was admitted due to sepsis with strep pneumonia bacteremia.  Patient was treated with IV Rocephin.  TEE was done which showed no vegetation.  No PFO or ASD.  Patient was discharged home and he finished full course of 14 days of IV Rocephin on 04/02/2020 per ID recommendation.  Repeat blood culture was also negative.  08/25/20 cycle 10  FOLFIRINOX 09/08/20- present  Starting cycle 11, FOLFIRI, Oxaliplatin discontinued due to neuropathy   #NGS showed no reportable targetable mutation #Genetic testing-[employer] diagnostic testing showed no pathological variants identified. MS stable, TMB 51mut/mb, KRAS G12D, SF3B1 K700E, TP53 V255fs*30  #01/14/2021  CT done at [facility] during the interval, stable disease.  # Her case was presented by Dr.Jia from [facility],The tumor board. Clayborne Artist were 4~5 liver lesions on OSH MRI last year at the time of diagnosis highly suspicious for metastasis. She is not eligible for surgical protocol for metastasis resection given the number of liver metastasis is >3. However given her excellent and durable response to chemo, surgical resection may be considered pending sustained disease control at 1 year and may require partial hepatectomy first to see if there is viable tumor before proceeding to Whipple resection ]  Patient had a COVID-19 infection in September 2022.  03/23/2021 CT abdomen pelvis No significant change in the ill-defined pancreatic head mass or  associated biliary ductal and pancreatic ductal dilation.  Slight interval enlargement of subcentimeter anterior peripancreatic  lymph node, nonspecific. Attention on follow-up per clinical protocol. Similar enlargement of the ascending thoracic aorta measuring 4.4 cm  03/23/2021 MRI abdomen w and wo  Increasing ill-defined hypoenhancement  of the pancreatic head surrounding the common bile duct stent, in keeping with known pancreatic malignancy. Anterior peripancreatic lymph node is better evaluated on CT.  Unchanged position of a common bile duct stent with similar mild diffuse intrahepatic ductal dilation and pneumobilia. No evidence of metastatic disease in the abdomen or pelvis. Partially visualized cystic left adnexal lesion measuring up to 3.9 cm.   04/10/2021, patient underwent liver resection at [name], by Dr. [name] Ducking.   Pathology report from [facility] was  reviewed. Segments 3 partial hepatectomy, negative for viable tumor. Section 5/6 partial hepatectomy part 1 and 2, microscopic foci of residual viable adenocarcinoma, morphologically consistent with history of pancreatic primary.  Parenchymal margin is uninvolved.  #Patient resumed on FOLFIRI on 04/28/2021. She got another cycle on 05/12/2021 #05/26/2021, patient did not get additional chemotherapy due to transaminitis.  Shared decision was made to stop FOLFIRI and switch to gemcitabine Abraxane treatments.  05/26/2021, patient developed transaminitis, AST 763, ALT 691, alkaline phosphatase 364.  Bilirubin 0.7 05/26/2021 stat ultrasound abdomen right upper quadrant showed interval development of 3.3 x 1.8 cm complex mass in the right hepatic lobe.  Increased extrahepatic ductal dilatation.  Concerning for CBD obstruction. 05/29/2021, MRI abdomen MRCP with and without contrast showed unchanged pancreatic head soft tissue.  Severe intra and extrahepatic biliary ductal dilatation.  Common bile duct stents remain in position however patency is not established.  No evidence of lymphadenopathy or metastatic disease in the abdomen.  With further communication with radiologist, addendum was added that there is internal fluid signal and no appreciable associated contrast enhancement measuring 3.4 x 2.3 cm.  This appearance is generally not consistent with metastasis and is of uncertain significance.  Possibly reflecting hepatic abscess or residual of subcapsular hematoma.  Patient was seen by [facility] Dr. Elon Jester team.  He had ERCP done on 06/05/2021, with findings of One stent from the biliary tree was seen in the major papilla. The stent had migrated significantly into the duodenum. This is the cause of stent malfunction. One stent was removed from the biliary tree. Prior biliary sphincterotomy appeared open. - A single severe biliary stricture was found in the lower third of the main bile duct with upstream dilation.  The stricture was alignant appearing.- The biliary tree was swept and sludge was found.- One fully covered metal stent was placed into the common bile duct across the stricture.- No pancreatogram performed.  06/12/2021-08/10/2021 gemcitabine and Abraxane.   10/14/2021, patient underwent Whipple procedure. Liver biopsy negative for malignancy. Review procedure showed invasive adenocarcinoma, moderately differentiated, centered in pancreatic head and confined in the Pancreas. Surgical margin is negative for malignancy.  36 lymph nodes were all negative for malignancy.  1 hepatic artery lymph node was harvested and was negative.  Gallbladder negative for malignancy.  Cystic duct excision negative for malignancy. pT1b pN0  10/25/2021, CT abdomen pelvis with contrast showed rim-enhancing fluid collection in the region of the hepatic hilum. Patient had a readmission for superficial wound infection.  She completed antibiotics on 11/03/2021.  11/03/2021, CT abdomen pelvis with contrast showed near resolution of previously seen fluid collection in the region of the hepatic hilum.  Similar appearance of the area of hypoenhancement in the liver.  Attention on follow-up.  11/17/2021, resumed on gemcitabine/Abraxane. 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  12/12/ 2023 CT scan at [facility] shows no evidence of cancer recurrence.-NED 09/21/2022 CT chest abdomen pelvis w contrast at [name] showed-No evidence of metastatic disease in the chest, abdomen, or pelvis. 12/21/2022  CT chest abdomen pelvis w contrast at [name] showed-No evidence of metastatic disease in the chest, abdomen, or pelvis.   03/22/2023 CT scan at [name] showed enlarging lung nodules. RLL 1cm, LLL 0.5cm, RUL 0.5cm Stable hypoattenuating right inferior hepatic lobe lesion measuring 0.9 cm.  Similar left adnexal cystic lesion measuring up to 5.0 cm.   04/06/2023 s/p lung nodule biopsy.  Rare atypical epithelioid cells present with evidence suggestive of  invasion, suspicious but not diagnostic for malignancy.   INTERVAL HISTORY AARTHI MAZZIOTTI is a 67 y.o. female who has above history reviewed by me today presents for stage IV pancreatic cancer.  She is doing well clinically.   05/20/2023: Right robot assisted thoracoscopic wedge resection with MLND (Dr. Rhona Raider)   Pathology:  Suspicious for malignancy but not diagnostic A. Lymph node, level 8, biopsy: Metastatic carcinoma involving one lymph node (1/1).  B. Lung, right lower lobe, wedge resection: Lung tissue positive for adenocarcinoma, consistent with metastasis from pancreas primary. See comment. Tumor size: 1.0 cm. Operative margin: Negative  Comment: Note is made of the patient's history of invasive adenocarcinoma of the pancreatic head ([id number]; 10/14/2021), which is compared to the current case. Comparison is challenging given the post-treatment nature of the pancreatic tumor; however, there are some morphologic similarities.  Although often unhelpful in the setting of lung primary versus pancreatic primary, immunohistochemical stains were performed. Tumor cells demonstrate the below immunoprofile which would could support pulmonary adenocarcinoma with enteric differentiation but would also be consistent with metastasis from the patient's known pancreatic primary; the latter is favored.              Positive                             Negative CK7 (strong, diffuse) TTF-1  CK20 (strong, focal)                    Positive and negative controls perform appropriately.    C. Lymph node, level 7, biopsy: Metastatic carcinoma involving one lymph node (1/1).  D. Lymph node, level 4R, biopsy: One lymph node, negative for malignancy (0/1).   Patient has no new complaints.  Weight has been stable.  She takes calcium supplementation as instructed.  She take Creon for pancreatic insufficiency.    Review of Systems  Constitutional:  Negative for appetite change, chills, fatigue, fever and  unexpected weight change.  HENT:   Negative for hearing loss and voice change.   Eyes:  Negative for eye problems.  Respiratory:  Negative for chest tightness and cough.   Cardiovascular:  Negative for chest pain.  Gastrointestinal:  Negative for abdominal distention, abdominal pain, blood in stool and nausea.  Endocrine: Negative for hot flashes.  Genitourinary:  Negative for difficulty urinating and frequency.   Musculoskeletal:  Negative for arthralgias.       Leg cramps  Skin:  Negative for itching and rash.  Neurological:  Positive for numbness. Negative for extremity weakness.  Hematological:  Negative for adenopathy.  Psychiatric/Behavioral:  Negative for confusion.     MEDICAL HISTORY:  Past Medical History:  Diagnosis Date   Allergy    Anemia    Cancer (HCC)    pancreatic cancer   Colon polyps    Family history of breast cancer    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   Neutropenia (HCC) 04/18/2020   Osteopenia after menopause 05/2017  femoral neck T score -2.0   Personal history of chemotherapy    current for pancreatic ca   PONV (postoperative nausea and vomiting)    Pure hypercholesterolemia     SURGICAL HISTORY: Past Surgical History:  Procedure Laterality Date   COLONOSCOPY  07/2015   WNL   COLONOSCOPY  2008/2011   COLONOSCOPY WITH PROPOFOL N/A 07/10/2021   Procedure: COLONOSCOPY WITH PROPOFOL;  Surgeon: Regis Bill, MD;  Location: [facility] ENDOSCOPY;  Service: Endoscopy;  Laterality: N/A;   IR CV LINE INJECTION  04/28/2021   OVARIAN CYST REMOVAL  1992   dermoid-Dr CAK   PORTA CATH INSERTION N/A 03/07/2020   Procedure: PORTA CATH INSERTION;  Surgeon: Annice Needy, MD;  Location: [facility] INVASIVE CV LAB;  Service: Cardiovascular;  Laterality: N/A;   TEE WITHOUT CARDIOVERSION N/A 03/21/2020   Procedure: TRANSESOPHAGEAL ECHOCARDIOGRAM (TEE);  Surgeon: Laurier Nancy, MD;  Location: [facility] ORS;  Service: Cardiovascular;  Laterality: N/A;   TUBAL  LIGATION  1993    SOCIAL HISTORY: Social History   Socioeconomic History   Marital status: Married    Spouse name: Not on file   Number of children: 2   Years of education: Not on file   Highest education level: Not on file  Occupational History   Occupation: Teacher    Comment: retired   Occupation: Visual merchandiser  Tobacco Use   Smoking status: Former    Current packs/day: 0.00    Average packs/day: 1 pack/day for 10.0 years (10.0 ttl pk-yrs)    Types: Cigarettes    Start date: 06/29/1975    Quit date: 06/28/1985    Years since quitting: 37.9   Smokeless tobacco: Never   Tobacco comments:    Quit smoking 1987  Vaping Use   Vaping status: Never Used  Substance and Sexual Activity   Alcohol use: Yes    Alcohol/week: 5.0 standard drinks of alcohol    Types: 5 Glasses of wine per week    Comment: 0-2 mixed drinks a day   Drug use: No   Sexual activity: Not Currently    Birth control/protection: Post-menopausal  Other Topics Concern   Not on file  Social History Narrative   Not on file   Social Drivers of Health   Financial Resource Strain: Low Risk  (05/20/2023)   Received from [facility]   Overall Financial Resource Strain (CARDIA)    Difficulty of Paying Living Expenses: Not hard at all  Food Insecurity: No Food Insecurity (05/24/2023)   Hunger Vital Sign    Worried About Running Out of Food in the Last Year: Never true    Ran Out of Food in the Last Year: Never true  Transportation Needs: No Transportation Needs (05/24/2023)   PRAPARE - Administrator, Civil Service (Medical): No    Lack of Transportation (Non-Medical): No  Physical Activity: Insufficiently Active (10/09/2022)   Exercise Vital Sign    Days of Exercise per Week: 4 days    Minutes of Exercise per Session: 30 min  Stress: No Stress Concern Present (10/09/2022)   [employer] of Occupational Health - Occupational Stress Questionnaire    Feeling of Stress : Not at all   Social Connections: Unknown (10/09/2022)   Social Connection and Isolation Panel [NHANES]    Frequency of Communication with Friends and Family: More than three times a week    Frequency of Social Gatherings with Friends and Family: More than three times a week  Attends Religious Services: Not on file    Active Member of Clubs or Organizations: No    Attends Banker Meetings: Never    Marital Status: Married  Catering manager Violence: Not At Risk (05/24/2023)   Humiliation, Afraid, Rape, and Kick questionnaire    Fear of Current or Ex-Partner: No    Emotionally Abused: No    Physically Abused: No    Sexually Abused: No    FAMILY HISTORY: Family History  Problem Relation Age of Onset   Breast cancer Paternal Aunt 56   Diabetes Mother    Osteoporosis Mother    Hyperlipidemia Father    Rheumatic fever Father    Valvular heart disease Father    Cancer Paternal Grandmother        possible colon   Breast cancer Sister 27    ALLERGIES:  is allergic to penicillin g.  MEDICATIONS:  Current Outpatient Medications  Medication Sig Dispense Refill   Calcium Carbonate-Vit D-Min (CALCIUM 1200 PO) Take by mouth.     Cholecalciferol 25 MCG (1000 UT) tablet Take 2,000 Units by mouth daily.     clobetasol cream (TEMOVATE) 0.05 %      Cyanocobalamin (B-12 PO) Take by mouth.     fluticasone (FLONASE) 50 MCG/ACT nasal spray Place 1 spray into both nostrils daily.     lidocaine-prilocaine (EMLA) cream APPLY TO AFFECTED AREA ONCE AS DIRECTED 30 g 6   Multiple Vitamins-Minerals (MULTIVITAMIN WITH MINERALS) tablet Take 1 tablet by mouth daily.     Pancrelipase, Lip-Prot-Amyl, (CREON) 24000-76000 units CPEP TAKE 1 CAPSULE THREE TIMES DAILY BEFORE MEALS 200 capsule 3   polyethylene glycol powder (GLYCOLAX/MIRALAX) 17 GM/SCOOP powder Take by mouth.     gabapentin (NEURONTIN) 100 MG capsule Take 1 capsule by mouth 3 (three) times daily. (Patient not taking: Reported on 06/17/2023)      ondansetron (ZOFRAN) 8 MG tablet Take 1 tablet (8 mg total) by mouth every 8 (eight) hours as needed for nausea or vomiting. (Patient not taking: Reported on 06/17/2023) 30 tablet 1   ondansetron (ZOFRAN-ODT) 4 MG disintegrating tablet Take 4 mg by mouth every 8 (eight) hours as needed. (Patient not taking: Reported on 06/17/2023)     prochlorperazine (COMPAZINE) 10 MG tablet  (Patient not taking: Reported on 06/17/2023)     prochlorperazine (COMPAZINE) 10 MG tablet Take 1 tablet (10 mg total) by mouth every 6 (six) hours as needed for nausea or vomiting. (Patient not taking: Reported on 06/17/2023) 30 tablet 1   senna-docusate (SENOKOT-S) 8.6-50 MG tablet Take by mouth. (Patient not taking: Reported on 06/17/2023)     simethicone (GAS-X) 80 MG chewable tablet Chew 1 tablet (80 mg total) by mouth every 8 (eight) hours as needed for flatulence. (Patient not taking: Reported on 06/17/2023) 60 tablet 0   No current facility-administered medications for this visit.   Facility-Administered Medications Ordered in Other Visits  Medication Dose Route Frequency Provider Last Rate Last Admin   heparin lock flush 100 UNIT/ML injection            prochlorperazine (COMPAZINE) 10 MG tablet            sodium chloride flush (NS) 0.9 % injection 10 mL  10 mL Intravenous PRN Cassandra Patience, MD   10 mL at 02/18/21 0858   sodium chloride flush (NS) 0.9 % injection 10 mL  10 mL Intravenous Once Borders, Ivin Booty R, NP       sodium chloride flush (NS) 0.9 % injection 10  mL  10 mL Intravenous Once Cassandra Patience, MD         PHYSICAL EXAMINATION: ECOG PERFORMANCE STATUS: 0 - Asymptomatic Vitals:   06/17/23 0847  BP: 123/78  Pulse: (!) 58  Resp: 18  Temp: 98.4 F (36.9 C)   Filed Weights   06/17/23 0847  Weight: 134 lb 4.8 oz (60.9 kg)    Physical Exam Constitutional:      General: She is not in acute distress. HENT:     Head: Normocephalic and atraumatic.  Eyes:     General: No scleral icterus. Cardiovascular:      Rate and Rhythm: Normal rate and regular rhythm.     Heart sounds: Normal heart sounds.  Pulmonary:     Effort: Pulmonary effort is normal. No respiratory distress.  Abdominal:     General: Bowel sounds are normal. There is no distension.     Palpations: Abdomen is soft.  Musculoskeletal:        General: No deformity. Normal range of motion.     Cervical back: Normal range of motion and neck supple.  Skin:    General: Skin is warm.     Coloration: Skin is not jaundiced.     Findings: No erythema.  Neurological:     Mental Status: She is alert and oriented to person, place, and time. Mental status is at baseline.  Psychiatric:        Mood and Affect: Mood normal.     LABORATORY DATA:  I have reviewed the data as listed    Latest Ref Rng & Units 06/17/2023    8:25 AM 05/30/2023   10:25 AM 02/16/2023   10:19 AM  CBC  WBC 4.0 - 10.5 K/uL 4.4  5.8  5.5   Hemoglobin 12.0 - 15.0 g/dL 16.1  09.6  04.5   Hematocrit 36.0 - 46.0 % 37.2  37.8  39.4   Platelets 150 - 400 K/uL 178  213  184       Latest Ref Rng & Units 06/17/2023    8:25 AM 05/30/2023   10:25 AM 02/16/2023   10:19 AM  CMP  Glucose 70 - 99 mg/dL 409  76  88   BUN 8 - 23 mg/dL 12  12  14    Creatinine 0.44 - 1.00 mg/dL 8.11  9.14  7.82   Sodium 135 - 145 mmol/L 138  137  138   Potassium 3.5 - 5.1 mmol/L 3.8  3.7  3.9   Chloride 98 - 111 mmol/L 105  102  107   CO2 22 - 32 mmol/L 23  25  25    Calcium 8.9 - 10.3 mg/dL 8.8  8.5  9.0   Total Protein 6.5 - 8.1 g/dL 6.4  6.5  6.8   Total Bilirubin <1.2 mg/dL 0.5  0.5  0.7   Alkaline Phos 38 - 126 U/L 66  72  76   AST 15 - 41 U/L 24  25  26    ALT 0 - 44 U/L 19  25  22       RADIOGRAPHIC STUDIES: I have personally reviewed the radiological images as listed and agreed with the findings in the report. Reviewed findings of MRI abdomen MRCP done at [name]. No results found.

## 2023-06-17 NOTE — Assessment & Plan Note (Signed)
Chemotherapy plan as listed above 

## 2023-06-17 NOTE — Telephone Encounter (Signed)
Image requested from [facility] via [employer]:  CT dual pancreas incl CT abd pel w and CT chest w MIPS from 03/22/23

## 2023-06-17 NOTE — Assessment & Plan Note (Signed)
fingertips and toes, grade 1/2.  She did not tolerate Cymbalta.   Stable symptoms. Acupuncture sessions as needed..  

## 2023-06-17 NOTE — Assessment & Plan Note (Addendum)
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  Pathology proved liver metastatic disease- additional chemotherapy --> whipple procedure.ypT1b ypN0--> adjuvant Gem/Abraxane finished in August 2023--> Sept 2024 CT showed progression in lung --> Nov 2024 Wedge biopsy confirmed adenocarcinoma with pancreatic origin  Labs reviewed and discussed with patient -  CA 19.9 remains low. CEA is slightly elevated- chronically Pathology was reviewed and discussed with patient. Recurrent pancreatic cancer with lung metastasis.  Recommend patient to resume systemic chemotherapy with Gemcitabine and Abraxane.  Based on her previous experience, Abraxane dose reduced to 1000mg /m2, and 2 weeks on 1 week off schedule.  Patient agrees with plan.  I will obtain CT chest abdomen pelvis w contrast for baseline.

## 2023-06-17 NOTE — Assessment & Plan Note (Signed)
S/p  Whipple procedure..  Continue Creon, continue 86578 units TID with meals and  with her snacks

## 2023-06-17 NOTE — Assessment & Plan Note (Signed)
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 Continue calcium supplementation, 2000 mg daily-divided in 2-3 doses

## 2023-06-17 NOTE — Patient Instructions (Signed)
[facility] - A DEPT OF [facility]  Discharge Instructions: Thank you for choosing [facility] to provide your oncology and hematology care.  If you have a lab appointment with the Cancer Center, please go directly to the Cancer Center and check in at the registration area.  Wear comfortable clothing and clothing appropriate for easy access to any Portacath or PICC line.   We strive to give you quality time with your provider. You may need to reschedule your appointment if you arrive late (15 or more minutes).  Arriving late affects you and other patients whose appointments are after yours.  Also, if you miss three or more appointments without notifying the office, you may be dismissed from the clinic at the provider's discretion.      For prescription refill requests, have your pharmacy contact our office and allow 72 hours for refills to be completed.    Today you received the following chemotherapy and/or immunotherapy agents Gemzar and Abraxane.      To help prevent nausea and vomiting after your treatment, we encourage you to take your nausea medication as directed.  BELOW ARE SYMPTOMS THAT SHOULD BE REPORTED IMMEDIATELY: *FEVER GREATER THAN 100.4 F (38 C) OR HIGHER *CHILLS OR SWEATING *NAUSEA AND VOMITING THAT IS NOT CONTROLLED WITH YOUR NAUSEA MEDICATION *UNUSUAL SHORTNESS OF BREATH *UNUSUAL BRUISING OR BLEEDING *URINARY PROBLEMS (pain or burning when urinating, or frequent urination) *BOWEL PROBLEMS (unusual diarrhea, constipation, pain near the anus) TENDERNESS IN MOUTH AND THROAT WITH OR WITHOUT PRESENCE OF ULCERS (sore throat, sores in mouth, or a toothache) UNUSUAL RASH, SWELLING OR PAIN  UNUSUAL VAGINAL DISCHARGE OR ITCHING   Items with * indicate a potential emergency and should be followed up as soon as possible or go to the Emergency Department if any problems should occur.  Please show the CHEMOTHERAPY ALERT CARD or  IMMUNOTHERAPY ALERT CARD at check-in to the Emergency Department and triage nurse.  Should you have questions after your visit or need to cancel or reschedule your appointment, please contact [facility] - A DEPT OF [facility]  [phone] and follow the prompts.  Office hours are 8:00 a.m. to 4:30 p.m. Monday - Friday. Please note that voicemails left after 4:00 p.m. may not be returned until the following business day.  We are closed weekends and major holidays. You have access to a nurse at all times for urgent questions. Please call the main number to the clinic [phone] and follow the prompts.  For any non-urgent questions, you may also contact your provider using MyChart. We now offer e-Visits for anyone 54 and older to request care online for non-urgent symptoms. For details visit [email or web address].   Also download the MyChart app! Go to the app store, search "MyChart", open the app, select [facility], and log in with your MyChart username and password.

## 2023-06-24 ENCOUNTER — Inpatient Hospital Stay: Payer: Medicare Other

## 2023-06-24 ENCOUNTER — Encounter: Payer: Self-pay | Admitting: Internal Medicine

## 2023-06-24 ENCOUNTER — Inpatient Hospital Stay (HOSPITAL_BASED_OUTPATIENT_CLINIC_OR_DEPARTMENT_OTHER): Payer: Medicare Other | Admitting: Internal Medicine

## 2023-06-24 VITALS — BP 124/82 | HR 59 | Temp 98.6°F | Resp 18 | Wt 133.8 lb

## 2023-06-24 DIAGNOSIS — C259 Malignant neoplasm of pancreas, unspecified: Secondary | ICD-10-CM

## 2023-06-24 DIAGNOSIS — D709 Neutropenia, unspecified: Secondary | ICD-10-CM

## 2023-06-24 DIAGNOSIS — Z5111 Encounter for antineoplastic chemotherapy: Secondary | ICD-10-CM | POA: Diagnosis not present

## 2023-06-24 LAB — CMP (CANCER CENTER ONLY)
ALT: 19 U/L (ref 0–44)
AST: 25 U/L (ref 15–41)
Albumin: 3.6 g/dL (ref 3.5–5.0)
Alkaline Phosphatase: 64 U/L (ref 38–126)
Anion gap: 9 (ref 5–15)
BUN: 15 mg/dL (ref 8–23)
CO2: 23 mmol/L (ref 22–32)
Calcium: 8.5 mg/dL — ABNORMAL LOW (ref 8.9–10.3)
Chloride: 107 mmol/L (ref 98–111)
Creatinine: 0.68 mg/dL (ref 0.44–1.00)
GFR, Estimated: >60 mL/min (ref >60)
Glucose, Bld: 170 mg/dL — ABNORMAL HIGH (ref 70–99)
Potassium: 3.3 mmol/L — ABNORMAL LOW (ref 3.5–5.1)
Sodium: 139 mmol/L (ref 135–145)
Total Bilirubin: 0.6 mg/dL (ref <1.2)
Total Protein: 6.2 g/dL — ABNORMAL LOW (ref 6.5–8.1)

## 2023-06-24 LAB — CBC WITH DIFFERENTIAL (CANCER CENTER ONLY)
Abs Immature Granulocytes: 0 10*3/uL (ref 0.00–0.07)
Basophils Absolute: 0 10*3/uL (ref 0.0–0.1)
Basophils Relative: 1 %
Eosinophils Absolute: 0 10*3/uL (ref 0.0–0.5)
Eosinophils Relative: 2 %
HCT: 32 % — ABNORMAL LOW (ref 36.0–46.0)
Hemoglobin: 11.4 g/dL — ABNORMAL LOW (ref 12.0–15.0)
Immature Granulocytes: 0 %
Lymphocytes Relative: 61 %
Lymphs Abs: 1.1 10*3/uL (ref 0.7–4.0)
MCH: 33.4 pg (ref 26.0–34.0)
MCHC: 35.6 g/dL (ref 30.0–36.0)
MCV: 93.8 fL (ref 80.0–100.0)
Monocytes Absolute: 0.1 10*3/uL (ref 0.1–1.0)
Monocytes Relative: 6 %
Neutro Abs: 0.5 10*3/uL — ABNORMAL LOW (ref 1.7–7.7)
Neutrophils Relative %: 30 %
Platelet Count: 108 10*3/uL — ABNORMAL LOW (ref 150–400)
RBC: 3.41 MIL/uL — ABNORMAL LOW (ref 3.87–5.11)
RDW: 11.3 % — ABNORMAL LOW (ref 11.5–15.5)
WBC Count: 1.7 10*3/uL — ABNORMAL LOW (ref 4.0–10.5)
nRBC: 0 % (ref 0.0–0.2)

## 2023-06-24 MED ORDER — HEPARIN SOD (PORK) LOCK FLUSH 100 UNIT/ML IV SOLN
500.0000 [IU] | Freq: Once | INTRAVENOUS | Status: AC
  Administered 2023-06-24: 500 [IU] via INTRAVENOUS
  Filled 2023-06-24: qty 5

## 2023-06-24 MED ORDER — POTASSIUM CHLORIDE CRYS ER 20 MEQ PO TBCR: 20.0000 meq | EXTENDED_RELEASE_TABLET | Freq: Every day | ORAL | 0 refills | Status: DC

## 2023-06-24 NOTE — Progress Notes (Signed)
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  Pathology proved liver metastatic disease- additional chemotherapy --> whipple procedure.ypT1b ypN0--> adjuvant Gem/Abraxane finished in August 2023--> Sept 2024 CT showed progression in lung --> Nov 2024 Wedge biopsy confirmed adenocarcinoma with pancreatic origin   Labs reviewed and discussed with [name] -  CA 19.9 remains low. CEA is slightly elevated- chronically Pathology was reviewed and discussed with [name]. Recurrent pancreatic cancer with lung metastasis.  Recommend [name] to resume systemic chemotherapy with Gemcitabine and Abraxane.   Resumed gemcitabine and Abraxane on 06/17/2023.  Labs reviewed.  WBC 1.7, ANC 500.  This was discussed with the [name] and her husband.  Defer treatment by 1 week.  Will get Auth for Zarzio and schedule her on Monday for 1 dose to help with quicker recovery.  We discussed about adding Neupogen injections with her chemo or changing the chemo regimen to every week schedule which may be better tolerated with her count.  She will discuss with Cassandra [name] further next week.   Chemotherapy-induced neuropathy (HCC) fingertips and toes, grade 1/2.  She did not tolerate Cymbalta.   Stable symptoms. Acupuncture sessions as needed..    Hypocalcemia Likely due to malabsorption. Continue calcium supplementation, 2000 mg daily-divided in 2-3 doses     Pancreatic insufficiency S/p  Whipple  procedure..  Continue Creon, continue 29562 units TID with meals and  with her snacks     Encounter for antineoplastic chemotherapy Chemotherapy plan as listed above    Orders Placed This Encounter  Procedures   CBC with Differential (Cancer Center Only)    Standing Status:   Future    Expected Date:   07/01/2023    Expiration Date:   06/30/2024   CMP (Cancer Center only)    Standing Status:   Future    Expected Date:   07/01/2023    Expiration Date:   06/30/2024    Hold chemo today. RTC in 1 week for MD visit, labs, cycle 1 day 8 of gemcitabine and Abraxane.    All questions were answered. The [name] knows to call the clinic with any problems, questions or concerns.   HISTORY OF PRESENTING ILLNESS:   Cassandra [name] is a  67 y.o.  female with presents for follow up of Stage IV pancreatic adenocarcinoma [name] initially presented with jaundice, transaminitis, bilirubin was 9.9.  CA 19-9 was 1874.  [name] also reports unintentional weight loss. 02/08/2020 MRI abdomen and MRCP with and without contrast was done at [facility] which showed pancreatic head mass measuring up to 3 cm, with marked associated narrowing of the portal vein confluence.  SMA is preserved.  Marked intrahepatic and extrahepatic biliary duct dilatation as well as mild dilatation of the main pancreatic duct. Multiple hepatic masses highly concerning for metastatic disease.  [name] underwent EUS on 02/19/2020, which showed irregular mass identified in the pancreatic head, hypoechoic, measured 49mmx33mm, sonographic evidence concerning for invasion into the superior mesenteric artery.  There is no sign of significant abnormality in the main pancreatic duct.  Dilatation of common bile duct which  measured up to 16 mm.  Region of celiac artery was visualized and showed no signs of significant abnormality.  No lymphadenopathy.  FNA showed adenocarcinoma.  02/19/2020, ERCP, malignant.  Biliary stricture was found at the mid/lower third of  the medial bile duct with upstream ductal dilatation.  The stricture was treated with placement of wall flex metal stent.  [name] was seen by [facility] oncology Dr. Olga [name] and was recommended for 3 drug regimen FOLFIRINOX.  [name] prefers to do chemotherapy locally at [facility].  [name] was referred to establish care today. She denies any pain.  Since stent placement, skin jaundice has improved.  Itchiness has also improved. [name] was accompanied by her husband today.  She has a family history of breast cancer in sister and paternal aunt, colon cancer paternal grandmother.  #No reportable targetable mutation on NGS 9/14/2021cycle 1 FOLFIRINOX.  [name] received oxaliplatin and about 50% of Irinotecan on day 1 and had experienced neurologic symptoms.  She went to ER and working diagnosis is TIA, and eventually I think this is due to irinotecan side effects -irinotecan-associated dysarthria, lip/tongue numbness.  Adjustment was made for Irinotecan to be  infused over 180 minutes.  Atropine 0.5 mg once prior to the irinotecan. No recurrent symptoms.   # 03/19/2020-03/21/2020 [name] was admitted due to sepsis with strep pneumonia bacteremia.  [name] was treated with IV Rocephin.  TEE was done which showed no vegetation.  No PFO or ASD.  [name] was discharged home and he finished full course of 14 days of IV Rocephin on 04/02/2020 per ID recommendation.  Repeat blood culture was also negative.  08/25/20 cycle 10 FOLFIRINOX 09/08/20- present  Starting cycle 11, FOLFIRI, Oxaliplatin discontinued due to neuropathy   #NGS showed no reportable targetable mutation #Genetic testing-[employer] diagnostic testing showed no pathological variants identified. MS stable, TMB 71mut/mb, KRAS G12D, SF3B1 K700E, TP53 V222fs*30  #01/14/2021  CT done at [facility] during the interval, stable disease.  # Her case was presented by CassandraJia from [facility] tumor board. Cassandra [name] were 4~5 liver lesions on OSH MRI last year at the time of diagnosis  highly suspicious for metastasis. She is not eligible for surgical protocol for metastasis resection given the number of liver metastasis is >3. However given her excellent and durable response to chemo, surgical resection may be considered pending sustained disease control at 1 year and may require partial hepatectomy first to see if there is viable tumor before proceeding to Whipple resection ]  [name] had a COVID-19 infection in September 2022.  03/23/2021 CT abdomen pelvis No significant change in the ill-defined pancreatic head mass or  associated biliary ductal and pancreatic ductal dilation.  Slight interval enlargement of subcentimeter anterior peripancreatic  lymph node, nonspecific. Attention on follow-up per clinical protocol. Similar enlargement of the ascending thoracic aorta measuring 4.4 cm  03/23/2021 MRI abdomen w and wo  Increasing ill-defined hypoenhancement of the pancreatic head surrounding the common bile duct stent, in keeping with known pancreatic malignancy. Anterior peripancreatic lymph node is better evaluated on CT.  Unchanged position of a common bile duct stent with similar mild diffuse intrahepatic ductal dilation and pneumobilia. No evidence of metastatic disease in the abdomen or pelvis. Partially visualized cystic left adnexal lesion measuring up to 3.9 cm.   04/10/2021, [name] underwent liver resection at [facility], by Dr. [name] Ducking.   Pathology report from [facility] was reviewed. Segments 3 partial hepatectomy, negative for viable tumor. Section 5/6 partial hepatectomy part 1 and 2, microscopic foci of residual viable adenocarcinoma, morphologically  consistent with history of pancreatic primary.  Parenchymal margin is uninvolved.  #[name] resumed on FOLFIRI on 04/28/2021. She got another cycle on 05/12/2021 #05/26/2021, [name] did not get additional chemotherapy due to transaminitis.  Shared decision was made to stop FOLFIRI and switch to gemcitabine Abraxane  treatments.  05/26/2021, [name] developed transaminitis, AST 763, ALT 691, alkaline phosphatase 364.  Bilirubin 0.7 05/26/2021 stat ultrasound abdomen right upper quadrant showed interval development of 3.3 x 1.8 cm complex mass in the right hepatic lobe.  Increased extrahepatic ductal dilatation.  Concerning for CBD obstruction. 05/29/2021, MRI abdomen MRCP with and without contrast showed unchanged pancreatic head soft tissue.  Severe intra and extrahepatic biliary ductal dilatation.  Common bile duct stents remain in position however patency is not established.  No evidence of lymphadenopathy or metastatic disease in the abdomen.  With further communication with radiologist, addendum was added that there is internal fluid signal and no appreciable associated contrast enhancement measuring 3.4 x 2.3 cm.  This appearance is generally not consistent with metastasis and is of uncertain significance.  Possibly reflecting hepatic abscess or residual of subcapsular hematoma.  [name] was seen by [facility] Dr. Elon Jester team.  He had ERCP done on 06/05/2021, with findings of One stent from the biliary tree was seen in the major papilla. The stent had migrated significantly into the duodenum. This is the cause of stent malfunction. One stent was removed from the biliary tree. Prior biliary sphincterotomy appeared open. - A single severe biliary stricture was found in the lower third of the main bile duct with upstream dilation. The stricture was alignant appearing.- The biliary tree was swept and sludge was found.- One fully covered metal stent was placed into the common bile duct across the stricture.- No pancreatogram performed.  06/12/2021-08/10/2021 gemcitabine and Abraxane.   10/14/2021, [name] underwent Whipple procedure. Liver biopsy negative for malignancy. Review procedure showed invasive adenocarcinoma, moderately differentiated, centered in pancreatic head and confined in the Pancreas. Surgical margin is  negative for malignancy.  36 lymph nodes were all negative for malignancy.  1 hepatic artery lymph node was harvested and was negative.  Gallbladder negative for malignancy.  Cystic duct excision negative for malignancy. pT1b pN0  10/25/2021, CT abdomen pelvis with contrast showed rim-enhancing fluid collection in the region of the hepatic hilum. [name] had a readmission for superficial wound infection.  She completed antibiotics on 11/03/2021.  11/03/2021, CT abdomen pelvis with contrast showed near resolution of previously seen fluid collection in the region of the hepatic hilum.  Similar appearance of the area of hypoenhancement in the liver.  Attention on follow-up.  11/17/2021, resumed on gemcitabine/Abraxane. 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  12/12/ 2023 CT scan at [facility] shows no evidence of cancer recurrence.-NED 09/21/2022 CT chest abdomen pelvis w contrast at [facility] showed-No evidence of metastatic disease in the chest, abdomen, or pelvis. 12/21/2022 CT chest abdomen pelvis w contrast at [facility] showed-No evidence of metastatic disease in the chest, abdomen, or pelvis.   03/22/2023 CT scan at [facility] showed enlarging lung nodules. RLL 1cm, LLL 0.5cm, RUL 0.5cm Stable hypoattenuating right inferior hepatic lobe lesion measuring 0.9 cm.  Similar left adnexal cystic lesion measuring up to 5.0 cm.   04/06/2023 s/p lung nodule biopsy.  Rare atypical epithelioid cells present with evidence suggestive of invasion, suspicious but not diagnostic for malignancy.   INTERVAL HISTORY KHADESHA PASSOW is a 67 y.o. female who has above history reviewed by me today presents for stage IV pancreatic cancer.  She is doing well clinically.   05/20/2023: Right robot assisted thoracoscopic wedge resection with MLND (Dr. Rhona Raider)   Pathology:  Suspicious for malignancy but not diagnostic A. Lymph node, level 8, biopsy: Metastatic carcinoma involving one lymph node (1/1).  B. Lung, right lower lobe, wedge  resection: Lung tissue positive for adenocarcinoma, consistent with metastasis from pancreas primary. See comment. Tumor size: 1.0 cm. Operative margin: Negative  Comment: Note is made of the [name]'s history of invasive adenocarcinoma of the pancreatic head ([id number]; 10/14/2021), which is compared to the current case. Comparison is challenging given the post-treatment nature of the pancreatic tumor; however, there are some morphologic similarities.  Although often unhelpful in the setting of lung primary versus pancreatic primary, immunohistochemical stains were performed. Tumor cells demonstrate the below immunoprofile which would could support pulmonary adenocarcinoma with enteric differentiation but would also be consistent with metastasis from the [name]'s known pancreatic primary; the latter is favored.              Positive                             Negative CK7 (strong, diffuse) TTF-1  CK20 (strong, focal)                    Positive and negative controls perform appropriately.    C. Lymph node, level 7, biopsy: Metastatic carcinoma involving one lymph node (1/1).  D. Lymph node, level 4R, biopsy: One lymph node, negative for malignancy (0/1).   [name] has no new complaints.  Weight has been stable.  She takes calcium supplementation as instructed.  She take Creon for pancreatic insufficiency.    Review of Systems  Constitutional:  Negative for appetite change, chills, fatigue, fever and unexpected weight change.  HENT:   Negative for hearing loss and voice change.   Eyes:  Negative for eye problems.  Respiratory:  Negative for chest tightness and cough.   Cardiovascular:  Negative for chest pain.  Gastrointestinal:  Negative for abdominal distention, abdominal pain, blood in stool and nausea.  Endocrine: Negative for hot flashes.  Genitourinary:  Negative for difficulty urinating and frequency.   Musculoskeletal:  Negative for arthralgias.       Leg cramps  Skin:   Negative for itching and rash.  Neurological:  Positive for numbness. Negative for extremity weakness.  Hematological:  Negative for adenopathy.  Psychiatric/Behavioral:  Negative for confusion.     MEDICAL HISTORY:  Past Medical History:  Diagnosis Date   Allergy    Anemia    Cancer (HCC)    pancreatic cancer   Colon polyps    Family history of breast cancer    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   Neutropenia (HCC) 04/18/2020   Osteopenia after menopause 05/2017   femoral neck T score -2.0   Personal history of chemotherapy    current for pancreatic ca   PONV (postoperative nausea and vomiting)    Pure hypercholesterolemia     SURGICAL HISTORY: Past Surgical History:  Procedure Laterality Date   COLONOSCOPY  07/2015   WNL   COLONOSCOPY  2008/2011   COLONOSCOPY WITH PROPOFOL N/A 07/10/2021   Procedure: COLONOSCOPY WITH PROPOFOL;  Surgeon: Regis Bill, MD;  Location: [facility] ENDOSCOPY;  Service: Endoscopy;  Laterality: N/A;   IR CV LINE INJECTION  04/28/2021   OVARIAN CYST REMOVAL  1992   dermoid-Dr CAK   PORTA CATH  INSERTION N/A 03/07/2020   Procedure: PORTA CATH INSERTION;  Surgeon: Annice Needy, MD;  Location: [facility] INVASIVE CV LAB;  Service: Cardiovascular;  Laterality: N/A;   TEE WITHOUT CARDIOVERSION N/A 03/21/2020   Procedure: TRANSESOPHAGEAL ECHOCARDIOGRAM (TEE);  Surgeon: Laurier Nancy, MD;  Location: [facility] ORS;  Service: Cardiovascular;  Laterality: N/A;   TUBAL LIGATION  1993    SOCIAL HISTORY: Social History   Socioeconomic History   Marital status: Married    Spouse name: Not on file   Number of children: 2   Years of education: Not on file   Highest education level: Not on file  Occupational History   Occupation: Teacher    Comment: retired   Occupation: Visual merchandiser  Tobacco Use   Smoking status: Former    Current packs/day: 0.00    Average packs/day: 1 pack/day for 10.0 years (10.0 ttl pk-yrs)    Types: Cigarettes    Start date:  06/29/1975    Quit date: 06/28/1985    Years since quitting: 38.0   Smokeless tobacco: Never   Tobacco comments:    Quit smoking 1987  Vaping Use   Vaping status: Never Used  Substance and Sexual Activity   Alcohol use: Yes    Alcohol/week: 5.0 standard drinks of alcohol    Types: 5 Glasses of wine per week    Comment: 0-2 mixed drinks a day   Drug use: No   Sexual activity: Not Currently    Birth control/protection: Post-menopausal  Other Topics Concern   Not on file  Social History Narrative   Not on file   Social Drivers of Health   Financial Resource Strain: Low Risk  (05/20/2023)   Received from [facility]   Overall Financial Resource Strain (CARDIA)    Difficulty of Paying Living Expenses: Not hard at all  Food Insecurity: No Food Insecurity (05/24/2023)   Hunger Vital Sign    Worried About Running Out of Food in the Last Year: Never true    Ran Out of Food in the Last Year: Never true  Transportation Needs: No Transportation Needs (05/24/2023)   PRAPARE - Administrator, Civil Service (Medical): No    Lack of Transportation (Non-Medical): No  Physical Activity: Insufficiently Active (10/09/2022)   Exercise Vital Sign    Days of Exercise per Week: 4 days    Minutes of Exercise per Session: 30 min  Stress: No Stress Concern Present (10/09/2022)   [employer] of Occupational Health - Occupational Stress Questionnaire    Feeling of Stress : Not at all  Social Connections: Unknown (10/09/2022)   Social Connection and Isolation Panel [NHANES]    Frequency of Communication with Friends and Family: More than three times a week    Frequency of Social Gatherings with Friends and Family: More than three times a week    Attends Religious Services: Not on Insurance claims handler of Clubs or Organizations: No    Attends Banker Meetings: Never    Marital Status: Married  Catering manager Violence: Not At Risk (05/24/2023)    Humiliation, Afraid, Rape, and Kick questionnaire    Fear of Current or Ex-Partner: No    Emotionally Abused: No    Physically Abused: No    Sexually Abused: No    FAMILY HISTORY: Family History  Problem Relation Age of Onset   Breast cancer Paternal Aunt 1   Diabetes Mother    Osteoporosis Mother    Hyperlipidemia Father  Rheumatic fever Father    Valvular heart disease Father    Cancer Paternal Grandmother        possible colon   Breast cancer Sister 43    ALLERGIES:  is allergic to penicillin g.  MEDICATIONS:  Current Outpatient Medications  Medication Sig Dispense Refill   Calcium Carbonate-Vit D-Min (CALCIUM 1200 PO) Take by mouth.     Cholecalciferol 25 MCG (1000 UT) tablet Take 2,000 Units by mouth daily.     clobetasol cream (TEMOVATE) 0.05 %      Cyanocobalamin (B-12 PO) Take by mouth.     fluticasone (FLONASE) 50 MCG/ACT nasal spray Place 1 spray into both nostrils daily.     lidocaine-prilocaine (EMLA) cream APPLY TO AFFECTED AREA ONCE AS DIRECTED 30 g 6   Multiple Vitamins-Minerals (MULTIVITAMIN WITH MINERALS) tablet Take 1 tablet by mouth daily.     ondansetron (ZOFRAN) 8 MG tablet Take 1 tablet (8 mg total) by mouth every 8 (eight) hours as needed for nausea or vomiting. 30 tablet 1   Pancrelipase, Lip-Prot-Amyl, (CREON) 24000-76000 units CPEP TAKE 1 CAPSULE THREE TIMES DAILY BEFORE MEALS 200 capsule 3   polyethylene glycol powder (GLYCOLAX/MIRALAX) 17 GM/SCOOP powder Take by mouth.     potassium chloride SA (KLOR-CON M) 20 MEQ tablet Take 1 tablet (20 mEq total) by mouth daily for 5 doses. 5 tablet 0   gabapentin (NEURONTIN) 100 MG capsule Take 1 capsule by mouth 3 (three) times daily. ([name] not taking: Reported on 06/24/2023)     ondansetron (ZOFRAN-ODT) 4 MG disintegrating tablet Take 4 mg by mouth every 8 (eight) hours as needed. ([name] not taking: Reported on 06/24/2023)     prochlorperazine (COMPAZINE) 10 MG tablet  ([name] not taking: Reported on  11/16/2022)     prochlorperazine (COMPAZINE) 10 MG tablet Take 1 tablet (10 mg total) by mouth every 6 (six) hours as needed for nausea or vomiting. ([name] not taking: Reported on 06/24/2023) 30 tablet 1   senna-docusate (SENOKOT-S) 8.6-50 MG tablet Take by mouth. ([name] not taking: Reported on 06/24/2023)     simethicone (GAS-X) 80 MG chewable tablet Chew 1 tablet (80 mg total) by mouth every 8 (eight) hours as needed for flatulence. ([name] not taking: Reported on 11/16/2022) 60 tablet 0   No current facility-administered medications for this visit.   Facility-Administered Medications Ordered in Other Visits  Medication Dose Route Frequency Provider Last Rate Last Admin   heparin lock flush 100 UNIT/ML injection            prochlorperazine (COMPAZINE) 10 MG tablet            sodium chloride flush (NS) 0.9 % injection 10 mL  10 mL Intravenous PRN Cassandra Patience, MD   10 mL at 02/18/21 0858   sodium chloride flush (NS) 0.9 % injection 10 mL  10 mL Intravenous Once Borders, Ivin Booty R, NP       sodium chloride flush (NS) 0.9 % injection 10 mL  10 mL Intravenous Once Cassandra Patience, MD         PHYSICAL EXAMINATION: ECOG PERFORMANCE STATUS: 0 - Asymptomatic Vitals:   06/24/23 0918  BP: 124/82  Pulse: (!) 59  Resp: 18  Temp: 98.6 F (37 C)  SpO2: 100%    Filed Weights   06/24/23 0918  Weight: 133 lb 12.8 oz (60.7 kg)     Physical Exam Constitutional:      General: She is not in acute distress. HENT:     Head: Normocephalic  and atraumatic.  Eyes:     General: No scleral icterus. Cardiovascular:     Rate and Rhythm: Normal rate and regular rhythm.     Heart sounds: Normal heart sounds.  Pulmonary:     Effort: Pulmonary effort is normal. No respiratory distress.  Abdominal:     General: Bowel sounds are normal. There is no distension.     Palpations: Abdomen is soft.  Musculoskeletal:        General: No deformity. Normal range of motion.     Cervical back: Normal range of motion and  neck supple.  Skin:    General: Skin is warm.     Coloration: Skin is not jaundiced.     Findings: No erythema.  Neurological:     Mental Status: She is alert and oriented to person, place, and time. Mental status is at baseline.  Psychiatric:        Mood and Affect: Mood normal.     LABORATORY DATA:  I have reviewed the data as listed    Latest Ref Rng & Units 06/24/2023    8:50 AM 06/17/2023    8:25 AM 05/30/2023   10:25 AM  CBC  WBC 4.0 - 10.5 K/uL 1.7  4.4  5.8   Hemoglobin 12.0 - 15.0 g/dL 16.1  09.6  04.5   Hematocrit 36.0 - 46.0 % 32.0  37.2  37.8   Platelets 150 - 400 K/uL 108  178  213       Latest Ref Rng & Units 06/24/2023    8:50 AM 06/17/2023    8:25 AM 05/30/2023   10:25 AM  CMP  Glucose 70 - 99 mg/dL 409  811  76   BUN 8 - 23 mg/dL 15  12  12    Creatinine 0.44 - 1.00 mg/dL 9.14  7.82  9.56   Sodium 135 - 145 mmol/L 139  138  137   Potassium 3.5 - 5.1 mmol/L 3.3  3.8  3.7   Chloride 98 - 111 mmol/L 107  105  102   CO2 22 - 32 mmol/L 23  23  25    Calcium 8.9 - 10.3 mg/dL 8.5  8.8  8.5   Total Protein 6.5 - 8.1 g/dL 6.2  6.4  6.5   Total Bilirubin <1.2 mg/dL 0.6  0.5  0.5   Alkaline Phos 38 - 126 U/L 64  66  72   AST 15 - 41 U/L 25  24  25    ALT 0 - 44 U/L 19  19  25       RADIOGRAPHIC STUDIES: I have personally reviewed the radiological images as listed and agreed with the findings in the report. Reviewed findings of MRI abdomen MRCP done at [facility]. No results found.

## 2023-06-24 NOTE — Progress Notes (Signed)
Patient here today for follow up regarding pancreatic cancer. Patient denies concerns today.

## 2023-06-27 ENCOUNTER — Encounter: Payer: Self-pay | Admitting: Oncology

## 2023-06-27 ENCOUNTER — Ambulatory Visit
Admission: RE | Admit: 2023-06-27 | Discharge: 2023-06-27 | Disposition: A | Discharge status: Home / Self Care | Payer: Medicare Other | Source: Ambulatory Visit | Attending: Oncology | Admitting: Oncology

## 2023-06-27 ENCOUNTER — Inpatient Hospital Stay: Payer: Medicare Other

## 2023-06-27 DIAGNOSIS — Z5111 Encounter for antineoplastic chemotherapy: Secondary | ICD-10-CM | POA: Diagnosis not present

## 2023-06-27 DIAGNOSIS — C259 Malignant neoplasm of pancreas, unspecified: Secondary | ICD-10-CM | POA: Diagnosis present

## 2023-06-27 DIAGNOSIS — D709 Neutropenia, unspecified: Secondary | ICD-10-CM

## 2023-06-27 MED ORDER — IOHEXOL 300 MG/ML  SOLN
80.0000 mL | Freq: Once | INTRAMUSCULAR | Status: AC | PRN
  Administered 2023-06-27: 80 mL via INTRAVENOUS

## 2023-06-27 MED ORDER — FILGRASTIM-SNDZ 480 MCG/0.8ML IJ SOSY
480.0000 ug | PREFILLED_SYRINGE | Freq: Once | INTRAMUSCULAR | Status: AC
  Administered 2023-06-27: 480 ug via SUBCUTANEOUS
  Filled 2023-06-27: qty 0.8

## 2023-06-27 NOTE — Telephone Encounter (Signed)
Images available in EPIC

## 2023-06-30 ENCOUNTER — Inpatient Hospital Stay: Payer: Medicare Other

## 2023-06-30 ENCOUNTER — Encounter: Payer: Self-pay | Admitting: Oncology

## 2023-06-30 ENCOUNTER — Inpatient Hospital Stay: Payer: Medicare Other | Attending: Oncology

## 2023-06-30 ENCOUNTER — Inpatient Hospital Stay (HOSPITAL_BASED_OUTPATIENT_CLINIC_OR_DEPARTMENT_OTHER): Payer: Medicare Other | Admitting: Oncology

## 2023-06-30 VITALS — BP 138/87 | HR 56 | Temp 97.3°F | Resp 18 | Wt 137.3 lb

## 2023-06-30 DIAGNOSIS — Z5111 Encounter for antineoplastic chemotherapy: Secondary | ICD-10-CM | POA: Diagnosis not present

## 2023-06-30 DIAGNOSIS — K5900 Constipation, unspecified: Secondary | ICD-10-CM | POA: Insufficient documentation

## 2023-06-30 DIAGNOSIS — Z87891 Personal history of nicotine dependence: Secondary | ICD-10-CM | POA: Diagnosis not present

## 2023-06-30 DIAGNOSIS — C250 Malignant neoplasm of head of pancreas: Secondary | ICD-10-CM | POA: Diagnosis present

## 2023-06-30 DIAGNOSIS — K8689 Other specified diseases of pancreas: Secondary | ICD-10-CM

## 2023-06-30 DIAGNOSIS — G620 Drug-induced polyneuropathy: Secondary | ICD-10-CM | POA: Diagnosis not present

## 2023-06-30 DIAGNOSIS — Z79899 Other long term (current) drug therapy: Secondary | ICD-10-CM | POA: Insufficient documentation

## 2023-06-30 DIAGNOSIS — C787 Secondary malignant neoplasm of liver and intrahepatic bile duct: Secondary | ICD-10-CM | POA: Insufficient documentation

## 2023-06-30 DIAGNOSIS — T451X5A Adverse effect of antineoplastic and immunosuppressive drugs, initial encounter: Secondary | ICD-10-CM

## 2023-06-30 DIAGNOSIS — C259 Malignant neoplasm of pancreas, unspecified: Secondary | ICD-10-CM

## 2023-06-30 DIAGNOSIS — K5903 Drug induced constipation: Secondary | ICD-10-CM

## 2023-06-30 LAB — CBC WITH DIFFERENTIAL (CANCER CENTER ONLY)
Abs Immature Granulocytes: 0.14 10*3/uL — ABNORMAL HIGH (ref 0.00–0.07)
Basophils Absolute: 0.1 10*3/uL (ref 0.0–0.1)
Basophils Relative: 1 %
Eosinophils Absolute: 0.2 10*3/uL (ref 0.0–0.5)
Eosinophils Relative: 2 %
HCT: 34.5 % — ABNORMAL LOW (ref 36.0–46.0)
Hemoglobin: 12 g/dL (ref 12.0–15.0)
Immature Granulocytes: 2 %
Lymphocytes Relative: 24 %
Lymphs Abs: 2.3 10*3/uL (ref 0.7–4.0)
MCH: 33.6 pg (ref 26.0–34.0)
MCHC: 34.8 g/dL (ref 30.0–36.0)
MCV: 96.6 fL (ref 80.0–100.0)
Monocytes Absolute: 1 10*3/uL (ref 0.1–1.0)
Monocytes Relative: 10 %
Neutro Abs: 6 10*3/uL (ref 1.7–7.7)
Neutrophils Relative %: 61 %
Platelet Count: 194 10*3/uL (ref 150–400)
RBC: 3.57 MIL/uL — ABNORMAL LOW (ref 3.87–5.11)
RDW: 12.5 % (ref 11.5–15.5)
WBC Count: 9.6 10*3/uL (ref 4.0–10.5)
nRBC: 0 % (ref 0.0–0.2)

## 2023-06-30 LAB — CMP (CANCER CENTER ONLY)
ALT: 19 U/L (ref 0–44)
AST: 20 U/L (ref 15–41)
Albumin: 4 g/dL (ref 3.5–5.0)
Alkaline Phosphatase: 84 U/L (ref 38–126)
Anion gap: 9 (ref 5–15)
BUN: 14 mg/dL (ref 8–23)
CO2: 25 mmol/L (ref 22–32)
Calcium: 8.7 mg/dL — ABNORMAL LOW (ref 8.9–10.3)
Chloride: 103 mmol/L (ref 98–111)
Creatinine: 0.69 mg/dL (ref 0.44–1.00)
GFR, Estimated: >60 mL/min (ref >60)
Glucose, Bld: 106 mg/dL — ABNORMAL HIGH (ref 70–99)
Potassium: 3.8 mmol/L (ref 3.5–5.1)
Sodium: 137 mmol/L (ref 135–145)
Total Bilirubin: 0.4 mg/dL (ref 0.0–1.2)
Total Protein: 6.3 g/dL — ABNORMAL LOW (ref 6.5–8.1)

## 2023-06-30 MED ORDER — SODIUM CHLORIDE 0.9 % IV SOLN
1000.0000 mg/m2 | Freq: Once | INTRAVENOUS | Status: AC
  Administered 2023-06-30: 1710 mg via INTRAVENOUS
  Filled 2023-06-30: qty 44.97

## 2023-06-30 MED ORDER — PROCHLORPERAZINE MALEATE 10 MG PO TABS
10.0000 mg | ORAL_TABLET | Freq: Once | ORAL | Status: AC
  Administered 2023-06-30: 10 mg via ORAL
  Filled 2023-06-30: qty 1

## 2023-06-30 MED ORDER — SODIUM CHLORIDE 0.9 % IV SOLN
INTRAVENOUS | Status: DC
  Filled 2023-06-30: qty 250

## 2023-06-30 MED ORDER — PACLITAXEL PROTEIN-BOUND CHEMO INJECTION 100 MG
100.0000 mg/m2 | Freq: Once | INTRAVENOUS | Status: AC
  Administered 2023-06-30: 175 mg via INTRAVENOUS
  Filled 2023-06-30: qty 35

## 2023-06-30 MED ORDER — HEPARIN SOD (PORK) LOCK FLUSH 100 UNIT/ML IV SOLN
500.0000 [IU] | Freq: Once | INTRAVENOUS | Status: AC | PRN
  Administered 2023-06-30: 500 [IU]
  Filled 2023-06-30: qty 5

## 2023-06-30 NOTE — Progress Notes (Signed)
 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  Pathology proved liver metastatic disease- additional chemotherapy --> whipple procedure.ypT1b ypN0--> adjuvant Gem/Abraxane  finished in August 2023--> Sept 2024 CT showed progression in lung --> Nov 2024 Wedge biopsy confirmed adenocarcinoma with pancreatic origin  Labs reviewed and discussed with patient -  CA 19.9 remains low. CEA is slightly elevated- chronically Pathology was reviewed and discussed with patient. Recurrent pancreatic cancer with lung metastasis.  Recommend patient to resume systemic chemotherapy with Gemcitabine  and Abraxane .  S/p cycle 1 D1,  D8 treatment was delayed due to neutropenia, she received GCSF.  I recommend to adjust regimen to treatment 1 week on 1 week off schedule. Close monitor,  Patient agrees with plan.  I will obtain CT chest abdomen pelvis w contrast for baseline- result is pending.   Chemotherapy-induced neuropathy (HCC) fingertips and toes, grade 1/2.  She did not tolerate Cymbalta .   Stable symptoms. Acupuncture sessions as needed..   Encounter for antineoplastic chemotherapy Chemotherapy plan as listed above  Hypocalcemia Likely due to malabsorption. Continue calcium  supplementation, 2000 mg daily-divided in 2-3 doses   Pancreatic insufficiency S/p  Whipple procedure..  Continue Creon , continue 24000 units TID with meals and  with her snacks   Constipation Possibly due to  antiemetics.  Recommend colace 100mg  daily, and otc miralx PRN    Orders Placed This Encounter  Procedures   CBC with Differential (Cancer Center Only)    Standing Status:   Future    Expected Date:   07/07/2023    Expiration Date:   06/29/2024    Follow up  1 week lab +/- GCSF 2 weeks lab MD Gemcitaibine Abraxane     All questions were answered. The patient knows to call the clinic with any problems, questions or concerns.  Call Babara, MD, PhD [facility] Hematology Oncology 06/30/2023   Babara Call, MD    HISTORY OF PRESENTING ILLNESS:   Cassandra [name] is a  68 y.o.  female with presents for follow up of Stage IV pancreatic adenocarcinoma Patient initially presented with jaundice, transaminitis, bilirubin was 9.9.  CA 19-9 was 1874.  Patient also reports unintentional weight loss. 02/08/2020 MRI abdomen and MRCP with and without contrast was done at [facility] which showed pancreatic head mass measuring up to 3 cm, with marked associated narrowing of the portal vein confluence.  SMA is preserved.  Marked intrahepatic and extrahepatic biliary duct dilatation as well as mild dilatation of the main pancreatic duct. Multiple hepatic masses highly concerning for metastatic disease.  Patient underwent EUS on 02/19/2020, which showed irregular mass identified in the pancreatic head, hypoechoic, measured 23mmx33mm, sonographic evidence concerning for invasion into the superior mesenteric artery.  There is no sign of significant abnormality in the main pancreatic duct.  Dilatation of common bile duct which measured up to 16 mm.  Region of celiac artery was visualized and showed no signs of significant abnormality.  No lymphadenopathy.  FNA showed adenocarcinoma.  02/19/2020, ERCP, malignant.  Biliary stricture was found at the mid/lower third of the medial bile duct with  upstream ductal dilatation.  The stricture was treated with placement of wall flex metal stent.  Patient was seen by [facility] oncology Dr. [name]  and was recommended for 3 drug regimen FOLFIRINOX.  Patient prefers to do chemotherapy locally at [facility].  Patient was referred to establish care today. She denies any pain.  Since stent placement, skin jaundice has improved.  Itchiness has also improved. Patient was accompanied by her husband today.  She has a family history of breast cancer in sister and paternal aunt, colon cancer paternal grandmother.  #No reportable targetable mutation on NGS 9/14/2021cycle 1 FOLFIRINOX.  Patient received oxaliplatin  and about 50% of Irinotecan  on day 1 and had experienced neurologic symptoms.  She went to ER and working diagnosis is TIA, and eventually I think this is due to irinotecan  side effects -irinotecan -associated dysarthria, lip/tongue numbness.  Adjustment was made for Irinotecan  to be  infused over 180 minutes.  Atropine  0.5 mg once prior to the irinotecan . No recurrent symptoms.   # 03/19/2020-03/21/2020 patient was admitted due to sepsis with strep pneumonia bacteremia.  Patient was treated with IV Rocephin .  TEE was done which showed no vegetation.  No PFO or ASD.  Patient was discharged home and he finished full course of 14 days of IV Rocephin  on 04/02/2020 per ID recommendation.  Repeat blood culture was also negative.  08/25/20 cycle 10 FOLFIRINOX 09/08/20- present  Starting cycle 11, FOLFIRI, Oxaliplatin  discontinued due to neuropathy   #NGS showed no reportable targetable mutation #Genetic testing-[employer] diagnostic testing showed no pathological variants identified. MS stable, TMB 0mut/mb, KRAS G12D, SF3B1 K700E, TP53 V240fs*30  #01/14/2021  CT done at [facility] during the interval, stable disease.  # Her case was presented by Dr.Jia from [facility] tumor board. [name] were 4~5 liver lesions on OSH MRI last year at the time of diagnosis highly suspicious for metastasis. She is not eligible for surgical protocol for metastasis resection given the number of liver metastasis is >3. However given her  excellent and durable response to chemo, surgical resection may be considered pending sustained disease control at 1 year and may require partial hepatectomy first to see if there is viable tumor before proceeding to Whipple resection ]  Patient had a COVID-19 infection in September 2022.  03/23/2021 CT abdomen pelvis No significant change in the ill-defined pancreatic head mass or  associated biliary ductal and pancreatic ductal dilation.  Slight interval enlargement of subcentimeter anterior peripancreatic  lymph node, nonspecific. Attention on follow-up per clinical protocol. Similar enlargement of the ascending thoracic aorta measuring 4.4 cm  03/23/2021 MRI abdomen w and wo  Increasing ill-defined hypoenhancement of the pancreatic head surrounding the common bile duct stent, in keeping with known pancreatic malignancy. Anterior peripancreatic lymph node is better evaluated on CT.  Unchanged position of a common bile duct stent with similar mild diffuse intrahepatic ductal dilation and pneumobilia. No evidence of metastatic disease in the abdomen or pelvis. Partially visualized cystic left adnexal lesion measuring up to 3.9 cm.   04/10/2021, patient underwent liver resection at [facility], by Dr. Zani.   Pathology report from [facility] was reviewed. Segments 3 partial hepatectomy, negative for viable tumor. Section 5/6 partial hepatectomy part 1 and 2, microscopic foci of residual viable adenocarcinoma, morphologically consistent with history of pancreatic primary.  Parenchymal margin is uninvolved.  #Patient resumed on FOLFIRI on 04/28/2021. She got another cycle on 05/12/2021 #05/26/2021, patient did not get additional chemotherapy due to transaminitis.  Shared decision was made to stop FOLFIRI and switch to  gemcitabine  Abraxane  treatments.  05/26/2021, patient developed transaminitis, AST 763, ALT 691, alkaline phosphatase 364.  Bilirubin 0.7 05/26/2021 stat ultrasound abdomen right upper quadrant  showed interval development of 3.3 x 1.8 cm complex mass in the right hepatic lobe.  Increased extrahepatic ductal dilatation.  Concerning for CBD obstruction. 05/29/2021, MRI abdomen MRCP with and without contrast showed unchanged pancreatic head soft tissue.  Severe intra and extrahepatic biliary ductal dilatation.  Common bile duct stents remain in position however patency is not established.  No evidence of lymphadenopathy or metastatic disease in the abdomen.  With further communication with radiologist, addendum was added that there is internal fluid signal and no appreciable associated contrast enhancement measuring 3.4 x 2.3 cm.  This appearance is generally not consistent with metastasis and is of uncertain significance.  Possibly reflecting hepatic abscess or residual of subcapsular hematoma.  Patient was seen by [facility] Dr. Oneal team.  He had ERCP done on 06/05/2021, with findings of One stent from the biliary tree was seen in the major papilla. The stent had migrated significantly into the duodenum. This is the cause of stent malfunction. One stent was removed from the biliary tree. Prior biliary sphincterotomy appeared open. - A single severe biliary stricture was found in the lower third of the main bile duct with upstream dilation. The stricture was alignant appearing.- The biliary tree was swept and sludge was found.- One fully covered metal stent was placed into the common bile duct across the stricture.- No pancreatogram performed.  06/12/2021-08/10/2021 gemcitabine  and Abraxane .   10/14/2021, patient underwent Whipple procedure. Liver biopsy negative for malignancy. Review procedure showed invasive adenocarcinoma, moderately differentiated, centered in pancreatic head and confined in the Pancreas. Surgical margin is negative for malignancy.  36 lymph nodes were all negative for malignancy.  1 hepatic artery lymph node was harvested and was negative.  Gallbladder negative for malignancy.   Cystic duct excision negative for malignancy. pT1b pN0  10/25/2021, CT abdomen pelvis with contrast showed rim-enhancing fluid collection in the region of the hepatic hilum. Patient had a readmission for superficial wound infection.  She completed antibiotics on 11/03/2021.  11/03/2021, CT abdomen pelvis with contrast showed near resolution of previously seen fluid collection in the region of the hepatic hilum.  Similar appearance of the area of hypoenhancement in the liver.  Attention on follow-up.  11/17/2021, resumed on gemcitabine /Abraxane . 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  12/12/ 2023 CT scan at [facility] shows no evidence of cancer recurrence.-NED 09/21/2022 CT chest abdomen pelvis w contrast at [facility] showed-No evidence of metastatic disease in the chest, abdomen, or pelvis. 12/21/2022 CT chest abdomen pelvis w contrast at [facility] showed-No evidence of metastatic disease in the chest, abdomen, or pelvis.   03/22/2023 CT scan at [facility] showed enlarging lung nodules. RLL 1cm, LLL 0.5cm, RUL 0.5cm Stable hypoattenuating right inferior hepatic lobe lesion measuring 0.9 cm.  Similar left adnexal cystic lesion measuring up to 5.0 cm.   04/06/2023 s/p lung nodule biopsy.  Rare atypical epithelioid cells present with evidence suggestive of invasion, suspicious but not diagnostic for malignancy.   05/20/2023: Right robot assisted thoracoscopic wedge resection with MLND (Dr. Murlean)   Pathology:  Suspicious for malignancy but not diagnostic A. Lymph node, level 8, biopsy: Metastatic carcinoma involving one lymph node (1/1).  B. Lung, right lower lobe, wedge resection: Lung tissue positive for adenocarcinoma, consistent with metastasis from pancreas primary. See comment. Tumor size: 1.0 cm. Operative margin: Negative  Comment: Note is made of the  patient's history of invasive adenocarcinoma of the pancreatic head ([id number]; 10/14/2021), which is compared to the current case. Comparison is  challenging given the post-treatment nature of the pancreatic tumor; however, there are some morphologic similarities.  Although often unhelpful in the setting of lung primary versus pancreatic primary, immunohistochemical stains were performed. Tumor cells demonstrate the below immunoprofile which would could support pulmonary adenocarcinoma with enteric differentiation but would also be consistent with metastasis from the patient's known pancreatic primary; the latter is favored.              Positive                             Negative CK7 (strong, diffuse) TTF-1  CK20 (strong, focal)                    Positive and negative controls perform appropriately.    C. Lymph node, level 7, biopsy: Metastatic carcinoma involving one lymph node (1/1).  D. Lymph node, level 4R, biopsy: One lymph node, negative for malignancy (0/1).   INTERVAL HISTORY ADDELINE CALARCO is a 68 y.o. female who has above history reviewed by me today presents for stage IV pancreatic cancer.   S/p cycle 1 Gemcitabine  Abraxane .  D8 was delayed due to neutropenia. S/p GCSF.  She feels tired after chemotherapy. She took Zofran  for nausea. No fever, chills.  + constipation.   She takes calcium  supplementation as instructed.  She take Creon  for pancreatic insufficiency.    Review of Systems  Constitutional:  Positive for fatigue. Negative for appetite change, chills, fever and unexpected weight change.  HENT:   Negative for hearing loss and voice change.   Eyes:  Negative for eye problems.  Respiratory:  Negative for chest tightness and cough.   Cardiovascular:  Negative for chest pain.  Gastrointestinal:  Negative for abdominal distention, abdominal pain, blood in stool and nausea.  Endocrine: Negative for hot flashes.  Genitourinary:  Negative for difficulty urinating and frequency.   Musculoskeletal:  Negative for arthralgias.       Leg cramps  Skin:  Negative for itching and rash.  Neurological:  Positive for  numbness. Negative for extremity weakness.  Hematological:  Negative for adenopathy.  Psychiatric/Behavioral:  Negative for confusion.     MEDICAL HISTORY:  Past Medical History:  Diagnosis Date   Allergy    Anemia    Cancer (HCC)    pancreatic cancer   Colon polyps    Family history of breast cancer    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   Neutropenia (HCC) 04/18/2020   Osteopenia after menopause 05/2017   femoral neck T score -2.0   Personal history of chemotherapy    current for pancreatic ca   PONV (postoperative nausea and vomiting)    Pure hypercholesterolemia     SURGICAL HISTORY: Past Surgical History:  Procedure Laterality Date   COLONOSCOPY  07/2015   WNL   COLONOSCOPY  2008/2011   COLONOSCOPY WITH PROPOFOL  N/A 07/10/2021   Procedure: COLONOSCOPY WITH PROPOFOL ;  Surgeon: Maryruth Ole DASEN, MD;  Location: [facility] ENDOSCOPY;  Service: Endoscopy;  Laterality: N/A;   IR CV LINE INJECTION  04/28/2021   OVARIAN CYST REMOVAL  1992   dermoid-Dr CAK   PORTA CATH INSERTION N/A 03/07/2020   Procedure: PORTA CATH INSERTION;  Surgeon: Marea Selinda RAMAN, MD;  Location: [facility] INVASIVE CV LAB;  Service: Cardiovascular;  Laterality:  N/A;   TEE WITHOUT CARDIOVERSION N/A 03/21/2020   Procedure: TRANSESOPHAGEAL ECHOCARDIOGRAM (TEE);  Surgeon: Fernand Denyse LABOR, MD;  Location: [facility] ORS;  Service: Cardiovascular;  Laterality: N/A;   TUBAL LIGATION  1993    SOCIAL HISTORY: Social History   Socioeconomic History   Marital status: Married    Spouse name: Not on file   Number of children: 2   Years of education: Not on file   Highest education level: Not on file  Occupational History   Occupation: Teacher    Comment: retired   Occupation: Visual Merchandiser  Tobacco Use   Smoking status: Former    Current packs/day: 0.00    Average packs/day: 1 pack/day for 10.0 years (10.0 ttl pk-yrs)    Types: Cigarettes    Start date: 06/29/1975    Quit date: 06/28/1985    Years since quitting:  38.0   Smokeless tobacco: Never   Tobacco comments:    Quit smoking 1987  Vaping Use   Vaping status: Never Used  Substance and Sexual Activity   Alcohol use: Yes    Alcohol/week: 5.0 standard drinks of alcohol    Types: 5 Glasses of wine per week    Comment: 0-2 mixed drinks a day   Drug use: No   Sexual activity: Not Currently    Birth control/protection: Post-menopausal  Other Topics Concern   Not on file  Social History Narrative   Not on file   Social Drivers of Health   Financial Resource Strain: Low Risk  (05/20/2023)   Received from [facility]   Overall Financial Resource Strain (CARDIA)    Difficulty of Paying Living Expenses: Not hard at all  Food Insecurity: No Food Insecurity (05/24/2023)   Hunger Vital Sign    Worried About Running Out of Food in the Last Year: Never true    Ran Out of Food in the Last Year: Never true  Transportation Needs: No Transportation Needs (05/24/2023)   PRAPARE - Administrator, Civil Service (Medical): No    Lack of Transportation (Non-Medical): No  Physical Activity: Insufficiently Active (10/09/2022)   Exercise Vital Sign    Days of Exercise per Week: 4 days    Minutes of Exercise per Session: 30 min  Stress: No Stress Concern Present (10/09/2022)   [employer] of Occupational Health - Occupational Stress Questionnaire    Feeling of Stress : Not at all  Social Connections: Unknown (10/09/2022)   Social Connection and Isolation Panel [NHANES]    Frequency of Communication with Friends and Family: More than three times a week    Frequency of Social Gatherings with Friends and Family: More than three times a week    Attends Religious Services: Not on Insurance Claims Handler of Clubs or Organizations: No    Attends Banker Meetings: Never    Marital Status: Married  Catering Manager Violence: Not At Risk (05/24/2023)   Humiliation, Afraid, Rape, and Kick questionnaire    Fear of  Current or Ex-Partner: No    Emotionally Abused: No    Physically Abused: No    Sexually Abused: No    FAMILY HISTORY: Family History  Problem Relation Age of Onset   Breast cancer Paternal Aunt 89   Diabetes Mother    Osteoporosis Mother    Hyperlipidemia Father    Rheumatic fever Father    Valvular heart disease Father    Cancer Paternal Grandmother  possible colon   Breast cancer Sister 40    ALLERGIES:  is allergic to penicillin g.  MEDICATIONS:  Current Outpatient Medications  Medication Sig Dispense Refill   Calcium  Carbonate-Vit D-Min (CALCIUM  1200 PO) Take by mouth.     Cholecalciferol  25 MCG (1000 UT) tablet Take 2,000 Units by mouth daily.     clobetasol  cream (TEMOVATE ) 0.05 %      Cyanocobalamin  (B-12 PO) Take by mouth.     fluticasone (FLONASE) 50 MCG/ACT nasal spray Place 1 spray into both nostrils daily.     lidocaine -prilocaine  (EMLA ) cream APPLY TO AFFECTED AREA ONCE AS DIRECTED 30 g 6   Multiple Vitamins-Minerals (MULTIVITAMIN WITH MINERALS) tablet Take 1 tablet by mouth daily.     ondansetron  (ZOFRAN ) 8 MG tablet Take 1 tablet (8 mg total) by mouth every 8 (eight) hours as needed for nausea or vomiting. 30 tablet 1   Pancrelipase , Lip-Prot-Amyl, (CREON ) 24000-76000 units CPEP TAKE 1 CAPSULE THREE TIMES DAILY BEFORE MEALS 200 capsule 3   polyethylene glycol powder (GLYCOLAX/MIRALAX) 17 GM/SCOOP powder Take by mouth.     gabapentin  (NEURONTIN ) 100 MG capsule Take 1 capsule by mouth 3 (three) times daily. (Patient not taking: Reported on 06/17/2023)     ondansetron  (ZOFRAN -ODT) 4 MG disintegrating tablet Take 4 mg by mouth every 8 (eight) hours as needed. (Patient not taking: Reported on 06/30/2023)     potassium chloride  SA (KLOR-CON  M) 20 MEQ tablet Take 1 tablet (20 mEq total) by mouth daily for 5 doses. (Patient not taking: Reported on 06/30/2023) 5 tablet 0   prochlorperazine  (COMPAZINE ) 10 MG tablet  (Patient not taking: Reported on 06/30/2023)      prochlorperazine  (COMPAZINE ) 10 MG tablet Take 1 tablet (10 mg total) by mouth every 6 (six) hours as needed for nausea or vomiting. (Patient not taking: Reported on 06/17/2023) 30 tablet 1   senna-docusate (SENOKOT-S) 8.6-50 MG tablet Take by mouth. (Patient not taking: Reported on 06/17/2023)     simethicone  (GAS-X) 80 MG chewable tablet Chew 1 tablet (80 mg total) by mouth every 8 (eight) hours as needed for flatulence. (Patient not taking: Reported on 06/30/2023) 60 tablet 0   No current facility-administered medications for this visit.   Facility-Administered Medications Ordered in Other Visits  Medication Dose Route Frequency Provider Last Rate Last Admin   0.9 %  sodium chloride  infusion   Intravenous Continuous Babara Call, MD   Stopped at 06/30/23 1635   heparin  lock flush 100 UNIT/ML injection            prochlorperazine  (COMPAZINE ) 10 MG tablet            sodium chloride  flush (NS) 0.9 % injection 10 mL  10 mL Intravenous PRN Babara Call, MD   10 mL at 02/18/21 0858   sodium chloride  flush (NS) 0.9 % injection 10 mL  10 mL Intravenous Once Borders, Joshua R, NP       sodium chloride  flush (NS) 0.9 % injection 10 mL  10 mL Intravenous Once Babara Call, MD         PHYSICAL EXAMINATION: ECOG PERFORMANCE STATUS: 0 - Asymptomatic Vitals:   06/30/23 1341  BP: 138/87  Pulse: (!) 56  Resp: 18  Temp: (!) 97.3 F (36.3 C)   Filed Weights   06/30/23 1341  Weight: 137 lb 4.8 oz (62.3 kg)    Physical Exam Constitutional:      General: She is not in acute distress. HENT:     Head: Normocephalic  and atraumatic.  Eyes:     General: No scleral icterus. Cardiovascular:     Rate and Rhythm: Normal rate and regular rhythm.     Heart sounds: Normal heart sounds.  Pulmonary:     Effort: Pulmonary effort is normal. No respiratory distress.  Abdominal:     General: Bowel sounds are normal. There is no distension.     Palpations: Abdomen is soft.  Musculoskeletal:        General: No deformity.  Normal range of motion.     Cervical back: Normal range of motion and neck supple.  Skin:    General: Skin is warm.     Coloration: Skin is not jaundiced.     Findings: No erythema.  Neurological:     Mental Status: She is alert and oriented to person, place, and time. Mental status is at baseline.  Psychiatric:        Mood and Affect: Mood normal.     LABORATORY DATA:  I have reviewed the data as listed    Latest Ref Rng & Units 06/30/2023    1:29 PM 06/24/2023    8:50 AM 06/17/2023    8:25 AM  CBC  WBC 4.0 - 10.5 K/uL 9.6  1.7  4.4   Hemoglobin 12.0 - 15.0 g/dL 87.9  88.5  87.0   Hematocrit 36.0 - 46.0 % 34.5  32.0  37.2   Platelets 150 - 400 K/uL 194  108  178       Latest Ref Rng & Units 06/30/2023    1:29 PM 06/24/2023    8:50 AM 06/17/2023    8:25 AM  CMP  Glucose 70 - 99 mg/dL 893  829  838   BUN 8 - 23 mg/dL 14  15  12    Creatinine 0.44 - 1.00 mg/dL 9.30  9.31  9.21   Sodium 135 - 145 mmol/L 137  139  138   Potassium 3.5 - 5.1 mmol/L 3.8  3.3  3.8   Chloride 98 - 111 mmol/L 103  107  105   CO2 22 - 32 mmol/L 25  23  23    Calcium  8.9 - 10.3 mg/dL 8.7  8.5  8.8   Total Protein 6.5 - 8.1 g/dL 6.3  6.2  6.4   Total Bilirubin 0.0 - 1.2 mg/dL 0.4  0.6  0.5   Alkaline Phos 38 - 126 U/L 84  64  66   AST 15 - 41 U/L 20  25  24    ALT 0 - 44 U/L 19  19  19       RADIOGRAPHIC STUDIES: I have personally reviewed the radiological images as listed and agreed with the findings in the report. Reviewed findings of MRI abdomen MRCP done at [facility]. No results found.

## 2023-06-30 NOTE — Assessment & Plan Note (Signed)
 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  Pathology proved liver metastatic disease- additional chemotherapy --> whipple procedure.ypT1b ypN0--> adjuvant Gem/Abraxane  finished in August 2023--> Sept 2024 CT showed progression in lung --> Nov 2024 Wedge biopsy confirmed adenocarcinoma with pancreatic origin  Labs reviewed and discussed with patient -  CA 19.9 remains low. CEA is slightly elevated- chronically Pathology was reviewed and discussed with patient. Recurrent pancreatic cancer with lung metastasis.  Recommend patient to resume systemic chemotherapy with Gemcitabine  and Abraxane .  S/p cycle 1 D1,  D8 treatment was delayed due to neutropenia, she received GCSF.  I recommend to adjust regimen to treatment 1 week on 1 week off schedule. Close monitor,  Patient agrees with plan.  I will obtain CT chest abdomen pelvis w contrast for baseline- result is pending.

## 2023-06-30 NOTE — Assessment & Plan Note (Signed)
fingertips and toes, grade 1/2.  She did not tolerate Cymbalta.   Stable symptoms. Acupuncture sessions as needed..  

## 2023-06-30 NOTE — Assessment & Plan Note (Signed)
 Chemotherapy plan as listed above

## 2023-06-30 NOTE — Assessment & Plan Note (Signed)
 S/p  Whipple procedure..  Continue Creon, continue [id number] units TID with meals and  with her snacks

## 2023-06-30 NOTE — Assessment & Plan Note (Signed)
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 Continue calcium supplementation, 2000 mg daily-divided in 2-3 doses

## 2023-06-30 NOTE — Assessment & Plan Note (Signed)
 Possibly due to antiemetics.  Recommend colace 100mg  daily, and otc miralx PRN

## 2023-07-07 ENCOUNTER — Inpatient Hospital Stay: Payer: Medicare Other

## 2023-07-07 DIAGNOSIS — Z5111 Encounter for antineoplastic chemotherapy: Secondary | ICD-10-CM | POA: Diagnosis not present

## 2023-07-07 DIAGNOSIS — C259 Malignant neoplasm of pancreas, unspecified: Secondary | ICD-10-CM

## 2023-07-07 LAB — CBC WITH DIFFERENTIAL (CANCER CENTER ONLY)
Abs Immature Granulocytes: 0.04 10*3/uL (ref 0.00–0.07)
Basophils Absolute: 0 10*3/uL (ref 0.0–0.1)
Basophils Relative: 1 %
Eosinophils Absolute: 0 10*3/uL (ref 0.0–0.5)
Eosinophils Relative: 1 %
HCT: 32.8 % — ABNORMAL LOW (ref 36.0–46.0)
Hemoglobin: 11.3 g/dL — ABNORMAL LOW (ref 12.0–15.0)
Immature Granulocytes: 1 %
Lymphocytes Relative: 42 %
Lymphs Abs: 1.7 10*3/uL (ref 0.7–4.0)
MCH: 32.8 pg (ref 26.0–34.0)
MCHC: 34.5 g/dL (ref 30.0–36.0)
MCV: 95.3 fL (ref 80.0–100.0)
Monocytes Absolute: 0.2 10*3/uL (ref 0.1–1.0)
Monocytes Relative: 5 %
Neutro Abs: 2 10*3/uL (ref 1.7–7.7)
Neutrophils Relative %: 50 %
Platelet Count: 246 10*3/uL (ref 150–400)
RBC: 3.44 MIL/uL — ABNORMAL LOW (ref 3.87–5.11)
RDW: 11.8 % (ref 11.5–15.5)
WBC Count: 4.1 10*3/uL (ref 4.0–10.5)
nRBC: 0 % (ref 0.0–0.2)

## 2023-07-07 MED ORDER — SODIUM CHLORIDE 0.9% FLUSH
10.0000 mL | INTRAVENOUS | Status: DC | PRN
  Administered 2023-07-07: 10 mL via INTRAVENOUS
  Filled 2023-07-07: qty 10

## 2023-07-07 MED ORDER — HEPARIN SOD (PORK) LOCK FLUSH 100 UNIT/ML IV SOLN
500.0000 [IU] | Freq: Once | INTRAVENOUS | Status: AC
Start: 2023-07-07 — End: 2023-07-07
  Administered 2023-07-07: 500 [IU] via INTRAVENOUS
  Filled 2023-07-07: qty 5

## 2023-07-11 ENCOUNTER — Ambulatory Visit: Payer: Medicare Other

## 2023-07-11 ENCOUNTER — Ambulatory Visit: Payer: Medicare Other | Admitting: Oncology

## 2023-07-11 ENCOUNTER — Other Ambulatory Visit: Payer: Medicare Other

## 2023-07-14 ENCOUNTER — Inpatient Hospital Stay: Payer: Medicare Other

## 2023-07-14 NOTE — Progress Notes (Signed)
[facility] Clinical Social Work  Clinical Social Work was referred by medical provider for assessment of psychosocial needs.  Clinical Social Worker contacted patient by phone to offer support and assess for needs.    Discussed Advance Directives.  She stated she has a HCPOA document and will bring to the cancer center on Monday, 1/20, to be scanned into her chart.  She said she was feeling good and expressed hope for the future.     Dorothey Baseman, LCSW  Clinical Social Worker [facility]

## 2023-07-18 ENCOUNTER — Other Ambulatory Visit: Payer: Medicare Other

## 2023-07-18 ENCOUNTER — Inpatient Hospital Stay (HOSPITAL_BASED_OUTPATIENT_CLINIC_OR_DEPARTMENT_OTHER): Payer: Medicare Other | Admitting: Oncology

## 2023-07-18 ENCOUNTER — Encounter: Payer: Self-pay | Admitting: Oncology

## 2023-07-18 ENCOUNTER — Inpatient Hospital Stay: Payer: Medicare Other

## 2023-07-18 ENCOUNTER — Ambulatory Visit: Payer: Medicare Other

## 2023-07-18 ENCOUNTER — Ambulatory Visit: Payer: Medicare Other | Admitting: Oncology

## 2023-07-18 VITALS — BP 124/80 | HR 58 | Temp 98.6°F | Resp 20 | Wt 135.7 lb

## 2023-07-18 DIAGNOSIS — K8689 Other specified diseases of pancreas: Secondary | ICD-10-CM

## 2023-07-18 DIAGNOSIS — C259 Malignant neoplasm of pancreas, unspecified: Secondary | ICD-10-CM

## 2023-07-18 DIAGNOSIS — Z5111 Encounter for antineoplastic chemotherapy: Secondary | ICD-10-CM | POA: Diagnosis not present

## 2023-07-18 DIAGNOSIS — G620 Drug-induced polyneuropathy: Secondary | ICD-10-CM | POA: Diagnosis not present

## 2023-07-18 DIAGNOSIS — T451X5A Adverse effect of antineoplastic and immunosuppressive drugs, initial encounter: Secondary | ICD-10-CM

## 2023-07-18 LAB — CBC WITH DIFFERENTIAL (CANCER CENTER ONLY)
Abs Immature Granulocytes: 0.06 10*3/uL (ref 0.00–0.07)
Basophils Absolute: 0 10*3/uL (ref 0.0–0.1)
Basophils Relative: 1 %
Eosinophils Absolute: 0.1 10*3/uL (ref 0.0–0.5)
Eosinophils Relative: 1 %
HCT: 34 % — ABNORMAL LOW (ref 36.0–46.0)
Hemoglobin: 11.8 g/dL — ABNORMAL LOW (ref 12.0–15.0)
Immature Granulocytes: 1 %
Lymphocytes Relative: 26 %
Lymphs Abs: 1.5 10*3/uL (ref 0.7–4.0)
MCH: 34.1 pg — ABNORMAL HIGH (ref 26.0–34.0)
MCHC: 34.7 g/dL (ref 30.0–36.0)
MCV: 98.3 fL (ref 80.0–100.0)
Monocytes Absolute: 0.6 10*3/uL (ref 0.1–1.0)
Monocytes Relative: 10 %
Neutro Abs: 3.3 10*3/uL (ref 1.7–7.7)
Neutrophils Relative %: 61 %
Platelet Count: 267 10*3/uL (ref 150–400)
RBC: 3.46 MIL/uL — ABNORMAL LOW (ref 3.87–5.11)
RDW: 13.5 % (ref 11.5–15.5)
WBC Count: 5.5 10*3/uL (ref 4.0–10.5)
nRBC: 0 % (ref 0.0–0.2)

## 2023-07-18 LAB — CMP (CANCER CENTER ONLY)
ALT: 20 U/L (ref 0–44)
AST: 24 U/L (ref 15–41)
Albumin: 3.8 g/dL (ref 3.5–5.0)
Alkaline Phosphatase: 73 U/L (ref 38–126)
Anion gap: 9 (ref 5–15)
BUN: 12 mg/dL (ref 8–23)
CO2: 24 mmol/L (ref 22–32)
Calcium: 8.4 mg/dL — ABNORMAL LOW (ref 8.9–10.3)
Chloride: 107 mmol/L (ref 98–111)
Creatinine: 0.72 mg/dL (ref 0.44–1.00)
GFR, Estimated: >60 mL/min (ref >60)
Glucose, Bld: 113 mg/dL — ABNORMAL HIGH (ref 70–99)
Potassium: 3.8 mmol/L (ref 3.5–5.1)
Sodium: 140 mmol/L (ref 135–145)
Total Bilirubin: 0.6 mg/dL (ref 0.0–1.2)
Total Protein: 6.3 g/dL — ABNORMAL LOW (ref 6.5–8.1)

## 2023-07-18 MED ORDER — PACLITAXEL PROTEIN-BOUND CHEMO INJECTION 100 MG
100.0000 mg/m2 | Freq: Once | INTRAVENOUS | Status: AC
Start: 2023-07-18 — End: 2023-07-18
  Administered 2023-07-18: 175 mg via INTRAVENOUS
  Filled 2023-07-18: qty 35

## 2023-07-18 MED ORDER — HEPARIN SOD (PORK) LOCK FLUSH 100 UNIT/ML IV SOLN
500.0000 [IU] | Freq: Once | INTRAVENOUS | Status: AC | PRN
  Administered 2023-07-18: 500 [IU]
  Filled 2023-07-18: qty 5

## 2023-07-18 MED ORDER — SODIUM CHLORIDE 0.9 % IV SOLN
INTRAVENOUS | Status: DC
  Filled 2023-07-18: qty 250

## 2023-07-18 MED ORDER — GEMCITABINE HCL CHEMO INJECTION 1 GM/26.3ML
1000.0000 mg/m2 | Freq: Once | INTRAVENOUS | Status: AC
  Administered 2023-07-18: 1710 mg via INTRAVENOUS
  Filled 2023-07-18: qty 44.97

## 2023-07-18 MED ORDER — PROCHLORPERAZINE MALEATE 10 MG PO TABS
10.0000 mg | ORAL_TABLET | Freq: Once | ORAL | Status: AC
Start: 2023-07-18 — End: 2023-07-18
  Administered 2023-07-18: 10 mg via ORAL
  Filled 2023-07-18: qty 1

## 2023-07-18 NOTE — Progress Notes (Signed)
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  Pathology proved liver metastatic disease- additional chemotherapy --> whipple procedure.ypT1b ypN0--> adjuvant Gem/Abraxane finished in August 2023--> Sept 2024 CT showed progression in lung --> Nov 2024 Wedge biopsy- Recurrent pancreatic cancer with lung metastasis.  Recommend patient to resume systemic chemotherapy with Gemcitabine and Abraxane.  Due to neutropenia, I adjusted regimen to 1 week on 1 week off schedule. Close monitor,  Labs are reviewed and discussed with patient. Proceed with Gemcitabine and Abraxane.    Chemotherapy-induced neuropathy (HCC) fingertips and toes, grade 1/2.  She did not tolerate Cymbalta.   Stable symptoms. Acupuncture sessions as needed..   Encounter for antineoplastic chemotherapy Chemotherapy plan as listed above  Hypocalcemia Likely due to malabsorption. Continue calcium supplementation, 2000 mg daily-divided in 2-3 doses   Pancreatic insufficiency S/p  Whipple procedure..  Continue Creon, continue 52841 units TID with meals and  with her snacks     Orders Placed This Encounter  Procedures   CBC with Differential (Cancer Center Only)    Standing Status:   Future    Expected Date:   08/08/2023    Expiration Date:   08/07/2024   CMP (Cancer Center only)    Standing Status:   Future    Expected Date:   08/08/2023    Expiration Date:   08/07/2024   CBC with Differential (Cancer  Center Only)    Standing Status:   Future    Expected Date:   08/22/2023    Expiration Date:   08/21/2024   CMP (Cancer Center only)    Standing Status:   Future    Expected Date:   08/22/2023    Expiration Date:   08/21/2024    Follow up  2 weeks lab MD Gemcitaibine Abraxane    All questions were answered. The patient knows to call the clinic with any problems, questions or concerns.  [name], MD, PhD [facility] Hematology Oncology 07/18/2023   [name], MD    HISTORY OF PRESENTING ILLNESS:   Cassandra [name] is a  68 y.o.  female with presents for follow up of Stage IV pancreatic adenocarcinoma Patient initially presented with jaundice, transaminitis, bilirubin was 9.9.  CA 19-9 was 1874.  Patient also reports unintentional weight loss. 02/08/2020 MRI abdomen and MRCP with and without contrast was done at [facility] which showed pancreatic head mass measuring up to 3 cm, with marked associated narrowing of the portal vein confluence.  SMA is preserved.  Marked intrahepatic and extrahepatic biliary duct dilatation as well as mild dilatation of the main pancreatic duct. Multiple hepatic masses highly concerning for metastatic disease.  Patient underwent EUS on 02/19/2020, which showed irregular mass identified in the pancreatic head, hypoechoic, measured 13mmx33mm, sonographic evidence concerning for invasion into the superior mesenteric artery.  There is no sign of significant abnormality in the main pancreatic duct.  Dilatation of common bile duct which measured up to 16 mm.  Region of celiac artery was visualized and showed no signs of significant abnormality.  No lymphadenopathy.  FNA showed  adenocarcinoma.  02/19/2020, ERCP, malignant.  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  The stricture was treated with placement of wall flex metal stent.  Patient was seen by [facility] oncology Dr. Olga Millers and was recommended for 3 drug regimen  FOLFIRINOX.  Patient prefers to do chemotherapy locally at [facility].  Patient was referred to establish care today. She denies any pain.  Since stent placement, skin jaundice has improved.  Itchiness has also improved. Patient was accompanied by her husband today.  She has a family history of breast cancer in sister and paternal aunt, colon cancer paternal grandmother.  #No reportable targetable mutation on NGS 9/14/2021cycle 1 FOLFIRINOX.  Patient received oxaliplatin and about 50% of Irinotecan on day 1 and had experienced neurologic symptoms.  She went to ER and working diagnosis is TIA, and eventually I think this is due to irinotecan side effects -irinotecan-associated dysarthria, lip/tongue numbness.  Adjustment was made for Irinotecan to be  infused over 180 minutes.  Atropine 0.5 mg once prior to the irinotecan. No recurrent symptoms.   # 03/19/2020-03/21/2020 patient was admitted due to sepsis with strep pneumonia bacteremia.  Patient was treated with IV Rocephin.  TEE was done which showed no vegetation.  No PFO or ASD.  Patient was discharged home and he finished full course of 14 days of IV Rocephin on 04/02/2020 per ID recommendation.  Repeat blood culture was also negative.  08/25/20 cycle 10 FOLFIRINOX 09/08/20- present  Starting cycle 11, FOLFIRI, Oxaliplatin discontinued due to neuropathy   #NGS showed no reportable targetable mutation #Genetic testing-[employer] diagnostic testing showed no pathological variants identified. MS stable, TMB 2mut/mb, KRAS G12D, SF3B1 K700E, TP53 V289fs*30  #01/14/2021  CT done at [facility] during the interval, stable disease.  # Her case was presented by Dr.Jia from [facility] tumor board. Clayborne Artist were 4~5 liver lesions on OSH MRI last year at the time of diagnosis highly suspicious for metastasis. She is not eligible for surgical protocol for metastasis resection given the number of liver metastasis is >3. However given her excellent and durable response to chemo,  surgical resection may be considered pending sustained disease control at 1 year and may require partial hepatectomy first to see if there is viable tumor before proceeding to Whipple resection ]  Patient had a COVID-19 infection in September 2022.  03/23/2021 CT abdomen pelvis No significant change in the ill-defined pancreatic head mass or  associated biliary ductal and pancreatic ductal dilation.  Slight interval enlargement of subcentimeter anterior peripancreatic  lymph node, nonspecific. Attention on follow-up per clinical protocol. Similar enlargement of the ascending thoracic aorta measuring 4.4 cm  03/23/2021 MRI abdomen w and wo  Increasing ill-defined hypoenhancement of the pancreatic head surrounding the common bile duct stent, in keeping with known pancreatic malignancy. Anterior peripancreatic lymph node is better evaluated on CT.  Unchanged position of a common bile duct stent with similar mild diffuse intrahepatic ductal dilation and pneumobilia. No evidence of metastatic disease in the abdomen or pelvis. Partially visualized cystic left adnexal lesion measuring up to 3.9 cm.   04/10/2021, patient underwent liver resection at [facility], by Dr. [name] Ducking.   Pathology report from [facility] was reviewed. Segments 3 partial hepatectomy, negative for viable tumor. Section 5/6 partial hepatectomy part 1 and 2, microscopic foci of residual viable adenocarcinoma, morphologically consistent with history of pancreatic primary.  Parenchymal margin is uninvolved.  #Patient resumed on FOLFIRI on 04/28/2021. She got another cycle on 05/12/2021 #05/26/2021,  patient did not get additional chemotherapy due to transaminitis.  Shared decision was made to stop FOLFIRI and switch to gemcitabine Abraxane treatments.  05/26/2021, patient developed transaminitis, AST 763, ALT 691, alkaline phosphatase 364.  Bilirubin 0.7 05/26/2021 stat ultrasound abdomen right upper quadrant showed interval development of 3.3 x 1.8 cm  complex mass in the right hepatic lobe.  Increased extrahepatic ductal dilatation.  Concerning for CBD obstruction. 05/29/2021, MRI abdomen MRCP with and without contrast showed unchanged pancreatic head soft tissue.  Severe intra and extrahepatic biliary ductal dilatation.  Common bile duct stents remain in position however patency is not established.  No evidence of lymphadenopathy or metastatic disease in the abdomen.  With further communication with radiologist, addendum was added that there is internal fluid signal and no appreciable associated contrast enhancement measuring 3.4 x 2.3 cm.  This appearance is generally not consistent with metastasis and is of uncertain significance.  Possibly reflecting hepatic abscess or residual of subcapsular hematoma.  Patient was seen by [facility] Dr. Elon Jester team.  He had ERCP done on 06/05/2021, with findings of One stent from the biliary tree was seen in the major papilla. The stent had migrated significantly into the duodenum. This is the cause of stent malfunction. One stent was removed from the biliary tree. Prior biliary sphincterotomy appeared open. - A single severe biliary stricture was found in the lower third of the main bile duct with upstream dilation. The stricture was alignant appearing.- The biliary tree was swept and sludge was found.- One fully covered metal stent was placed into the common bile duct across the stricture.- No pancreatogram performed.  06/12/2021-08/10/2021 gemcitabine and Abraxane.   10/14/2021, patient underwent Whipple procedure. Liver biopsy negative for malignancy. Review procedure showed invasive adenocarcinoma, moderately differentiated, centered in pancreatic head and confined in the Pancreas. Surgical margin is negative for malignancy.  36 lymph nodes were all negative for malignancy.  1 hepatic artery lymph node was harvested and was negative.  Gallbladder negative for malignancy.  Cystic duct excision negative for malignancy.  pT1b pN0  10/25/2021, CT abdomen pelvis with contrast showed rim-enhancing fluid collection in the region of the hepatic hilum. Patient had a readmission for superficial wound infection.  She completed antibiotics on 11/03/2021.  11/03/2021, CT abdomen pelvis with contrast showed near resolution of previously seen fluid collection in the region of the hepatic hilum.  Similar appearance of the area of hypoenhancement in the liver.  Attention on follow-up.  11/17/2021, resumed on gemcitabine/Abraxane. 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  12/12/ 2023 CT scan at [facility] shows no evidence of cancer recurrence.-NED 09/21/2022 CT chest abdomen pelvis w contrast at [facility] showed-No evidence of metastatic disease in the chest, abdomen, or pelvis. 12/21/2022 CT chest abdomen pelvis w contrast at [facility] showed-No evidence of metastatic disease in the chest, abdomen, or pelvis.   03/22/2023 CT scan at [facility] showed enlarging lung nodules. RLL 1cm, LLL 0.5cm, RUL 0.5cm Stable hypoattenuating right inferior hepatic lobe lesion measuring 0.9 cm.  Similar left adnexal cystic lesion measuring up to 5.0 cm.   04/06/2023 s/p lung nodule biopsy.  Rare atypical epithelioid cells present with evidence suggestive of invasion, suspicious but not diagnostic for malignancy.   05/20/2023: Right robot assisted thoracoscopic wedge resection with MLND (Dr. Rhona Raider)   Pathology:  Suspicious for malignancy but not diagnostic A. Lymph node, level 8, biopsy: Metastatic carcinoma involving one lymph node (1/1).  B. Lung, right lower lobe, wedge resection: Lung tissue positive for adenocarcinoma, consistent with  metastasis from pancreas primary. See comment. Tumor size: 1.0 cm. Operative margin: Negative  Comment: Note is made of the patient's history of invasive adenocarcinoma of the pancreatic head ([id number]; 10/14/2021), which is compared to the current case. Comparison is challenging given the post-treatment nature of the  pancreatic tumor; however, there are some morphologic similarities.  Although often unhelpful in the setting of lung primary versus pancreatic primary, immunohistochemical stains were performed. Tumor cells demonstrate the below immunoprofile which would could support pulmonary adenocarcinoma with enteric differentiation but would also be consistent with metastasis from the patient's known pancreatic primary; the latter is favored.              Positive                             Negative CK7 (strong, diffuse) TTF-1  CK20 (strong, focal)                    Positive and negative controls perform appropriately.    C. Lymph node, level 7, biopsy: Metastatic carcinoma involving one lymph node (1/1).  D. Lymph node, level 4R, biopsy: One lymph node, negative for malignancy (0/1).   INTERVAL HISTORY Cassandra [name] is a 68 y.o. female who has above history reviewed by me today presents for stage IV pancreatic cancer.   S/p cycle 1 Gemcitabine Abraxane.  D8 was delayed due to neutropenia. S/p GCSF.  She feels tired after chemotherapy. + constipation.   She takes calcium supplementation as instructed.  She take Creon for pancreatic insufficiency.    Review of Systems  Constitutional:  Positive for fatigue. Negative for appetite change, chills, fever and unexpected weight change.  HENT:   Negative for hearing loss and voice change.   Eyes:  Negative for eye problems.  Respiratory:  Negative for chest tightness and cough.   Cardiovascular:  Negative for chest pain.  Gastrointestinal:  Negative for abdominal distention, abdominal pain, blood in stool and nausea.  Endocrine: Negative for hot flashes.  Genitourinary:  Negative for difficulty urinating and frequency.   Musculoskeletal:  Negative for arthralgias.       Leg cramps  Skin:  Negative for itching and rash.  Neurological:  Positive for numbness. Negative for extremity weakness.  Hematological:  Negative for adenopathy.   Psychiatric/Behavioral:  Negative for confusion.     MEDICAL HISTORY:  Past Medical History:  Diagnosis Date   Allergy    Anemia    Cancer (HCC)    pancreatic cancer   Colon polyps    Family history of breast cancer    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   Neutropenia (HCC) 04/18/2020   Osteopenia after menopause 05/2017   femoral neck T score -2.0   Personal history of chemotherapy    current for pancreatic ca   PONV (postoperative nausea and vomiting)    Pure hypercholesterolemia     SURGICAL HISTORY: Past Surgical History:  Procedure Laterality Date   COLONOSCOPY  07/2015   WNL   COLONOSCOPY  2008/2011   COLONOSCOPY WITH PROPOFOL N/A 07/10/2021   Procedure: COLONOSCOPY WITH PROPOFOL;  Surgeon: Regis Bill, MD;  Location: [facility] ENDOSCOPY;  Service: Endoscopy;  Laterality: N/A;   IR CV LINE INJECTION  04/28/2021   OVARIAN CYST REMOVAL  1992   dermoid-Dr CAK   PORTA CATH INSERTION N/A 03/07/2020   Procedure: PORTA CATH INSERTION;  Surgeon: Annice Needy, MD;  Location: [facility] INVASIVE CV LAB;  Service: Cardiovascular;  Laterality: N/A;   TEE WITHOUT CARDIOVERSION N/A 03/21/2020   Procedure: TRANSESOPHAGEAL ECHOCARDIOGRAM (TEE);  Surgeon: Laurier Nancy, MD;  Location: [facility] ORS;  Service: Cardiovascular;  Laterality: N/A;   TUBAL LIGATION  1993    SOCIAL HISTORY: Social History   Socioeconomic History   Marital status: Married    Spouse name: Not on file   Number of children: 2   Years of education: Not on file   Highest education level: Not on file  Occupational History   Occupation: Teacher    Comment: retired   Occupation: Visual merchandiser  Tobacco Use   Smoking status: Former    Current packs/day: 0.00    Average packs/day: 1 pack/day for 10.0 years (10.0 ttl pk-yrs)    Types: Cigarettes    Start date: 06/29/1975    Quit date: 06/28/1985    Years since quitting: 38.0   Smokeless tobacco: Never   Tobacco comments:    Quit smoking 1987  Vaping Use    Vaping status: Never Used  Substance and Sexual Activity   Alcohol use: Yes    Alcohol/week: 5.0 standard drinks of alcohol    Types: 5 Glasses of wine per week    Comment: 0-2 mixed drinks a day   Drug use: No   Sexual activity: Not Currently    Birth control/protection: Post-menopausal  Other Topics Concern   Not on file  Social History Narrative   Not on file   Social Drivers of Health   Financial Resource Strain: Low Risk  (05/20/2023)   Received from [facility]   Overall Financial Resource Strain (CARDIA)    Difficulty of Paying Living Expenses: Not hard at all  Food Insecurity: No Food Insecurity (05/24/2023)   Hunger Vital Sign    Worried About Running Out of Food in the Last Year: Never true    Ran Out of Food in the Last Year: Never true  Transportation Needs: No Transportation Needs (05/24/2023)   PRAPARE - Administrator, Civil Service (Medical): No    Lack of Transportation (Non-Medical): No  Physical Activity: Insufficiently Active (10/09/2022)   Exercise Vital Sign    Days of Exercise per Week: 4 days    Minutes of Exercise per Session: 30 min  Stress: No Stress Concern Present (10/09/2022)   [employer] of Occupational Health - Occupational Stress Questionnaire    Feeling of Stress : Not at all  Social Connections: Unknown (10/09/2022)   Social Connection and Isolation Panel [NHANES]    Frequency of Communication with Friends and Family: More than three times a week    Frequency of Social Gatherings with Friends and Family: More than three times a week    Attends Religious Services: Not on Insurance claims handler of Clubs or Organizations: No    Attends Banker Meetings: Never    Marital Status: Married  Catering manager Violence: Not At Risk (05/24/2023)   Humiliation, Afraid, Rape, and Kick questionnaire    Fear of Current or Ex-Partner: No    Emotionally Abused: No    Physically Abused: No    Sexually  Abused: No    FAMILY HISTORY: Family History  Problem Relation Age of Onset   Breast cancer Paternal Aunt 29   Diabetes Mother    Osteoporosis Mother    Hyperlipidemia Father    Rheumatic fever Father    Valvular heart disease Father  Cancer Paternal Grandmother        possible colon   Breast cancer Sister 69    ALLERGIES:  is allergic to penicillin g.  MEDICATIONS:  Current Outpatient Medications  Medication Sig Dispense Refill   b complex vitamins capsule Take 1 capsule by mouth daily.     Calcium Carbonate-Vit D-Min (CALCIUM 1200 PO) Take by mouth.     Cholecalciferol 25 MCG (1000 UT) tablet Take 2,000 Units by mouth daily.     clobetasol cream (TEMOVATE) 0.05 %      fluticasone (FLONASE) 50 MCG/ACT nasal spray Place 1 spray into both nostrils daily.     lidocaine-prilocaine (EMLA) cream APPLY TO AFFECTED AREA ONCE AS DIRECTED 30 g 6   Melatonin 2.5 MG CHEW Chew by mouth.     Multiple Vitamins-Minerals (MULTIVITAMIN WITH MINERALS) tablet Take 1 tablet by mouth daily.     ondansetron (ZOFRAN) 8 MG tablet Take 1 tablet (8 mg total) by mouth every 8 (eight) hours as needed for nausea or vomiting. 30 tablet 1   Pancrelipase, Lip-Prot-Amyl, (CREON) 24000-76000 units CPEP TAKE 1 CAPSULE THREE TIMES DAILY BEFORE MEALS 200 capsule 3   Cyanocobalamin (B-12 PO) Take by mouth. (Patient not taking: Reported on 07/18/2023)     gabapentin (NEURONTIN) 100 MG capsule Take 1 capsule by mouth 3 (three) times daily. (Patient not taking: Reported on 07/18/2023)     ondansetron (ZOFRAN-ODT) 4 MG disintegrating tablet Take 4 mg by mouth every 8 (eight) hours as needed. (Patient not taking: Reported on 06/24/2023)     polyethylene glycol powder (GLYCOLAX/MIRALAX) 17 GM/SCOOP powder Take by mouth. (Patient not taking: Reported on 07/18/2023)     potassium chloride SA (KLOR-CON M) 20 MEQ tablet Take 1 tablet (20 mEq total) by mouth daily for 5 doses. (Patient not taking: Reported on 07/18/2023) 5 tablet  0   prochlorperazine (COMPAZINE) 10 MG tablet  (Patient not taking: Reported on 11/16/2022)     prochlorperazine (COMPAZINE) 10 MG tablet Take 1 tablet (10 mg total) by mouth every 6 (six) hours as needed for nausea or vomiting. (Patient not taking: Reported on 07/18/2023) 30 tablet 1   senna-docusate (SENOKOT-S) 8.6-50 MG tablet Take by mouth. (Patient not taking: Reported on 07/18/2023)     simethicone (GAS-X) 80 MG chewable tablet Chew 1 tablet (80 mg total) by mouth every 8 (eight) hours as needed for flatulence. (Patient not taking: Reported on 11/16/2022) 60 tablet 0   No current facility-administered medications for this visit.   Facility-Administered Medications Ordered in Other Visits  Medication Dose Route Frequency Provider Last Rate Last Admin   0.9 %  sodium chloride infusion   Intravenous Continuous [name], MD 10 mL/hr at 07/18/23 1005 New Bag at 07/18/23 1005   gemcitabine (GEMZAR) 1,710 mg in sodium chloride 0.9 % 250 mL chemo infusion  1,000 mg/m2 (Order-Specific) Intravenous Once [name], MD       heparin lock flush 100 UNIT/ML injection            heparin lock flush 100 unit/mL  500 Units Intracatheter Once PRN [name], MD       prochlorperazine (COMPAZINE) 10 MG tablet            sodium chloride flush (NS) 0.9 % injection 10 mL  10 mL Intravenous PRN [name], MD   10 mL at 02/18/21 0858   sodium chloride flush (NS) 0.9 % injection 10 mL  10 mL Intravenous Once Borders, Daryl Eastern, NP  sodium chloride flush (NS) 0.9 % injection 10 mL  10 mL Intravenous Once [name], MD         PHYSICAL EXAMINATION: ECOG PERFORMANCE STATUS: 0 - Asymptomatic Vitals:   07/18/23 0833  BP: 124/80  Pulse: (!) 58  Resp: 20  Temp: 98.6 F (37 C)  SpO2: 100%   Filed Weights   07/18/23 0833  Weight: 135 lb 11.2 oz (61.6 kg)    Physical Exam Constitutional:      General: She is not in acute distress. HENT:     Head: Normocephalic and atraumatic.  Eyes:     General: No scleral  icterus. Cardiovascular:     Rate and Rhythm: Normal rate and regular rhythm.     Heart sounds: Normal heart sounds.  Pulmonary:     Effort: Pulmonary effort is normal. No respiratory distress.  Abdominal:     General: Bowel sounds are normal. There is no distension.     Palpations: Abdomen is soft.  Musculoskeletal:        General: No deformity. Normal range of motion.     Cervical back: Normal range of motion and neck supple.  Skin:    General: Skin is warm.     Coloration: Skin is not jaundiced.     Findings: No erythema.  Neurological:     Mental Status: She is alert and oriented to person, place, and time. Mental status is at baseline.  Psychiatric:        Mood and Affect: Mood normal.     LABORATORY DATA:  I have reviewed the data as listed    Latest Ref Rng & Units 07/18/2023    8:21 AM 07/07/2023   10:14 AM 06/30/2023    1:29 PM  CBC  WBC 4.0 - 10.5 K/uL 5.5  4.1  9.6   Hemoglobin 12.0 - 15.0 g/dL 25.3  66.4  40.3   Hematocrit 36.0 - 46.0 % 34.0  32.8  34.5   Platelets 150 - 400 K/uL 267  246  194       Latest Ref Rng & Units 07/18/2023    8:21 AM 06/30/2023    1:29 PM 06/24/2023    8:50 AM  CMP  Glucose 70 - 99 mg/dL 474  259  563   BUN 8 - 23 mg/dL 12  14  15    Creatinine 0.44 - 1.00 mg/dL 8.75  6.43  3.29   Sodium 135 - 145 mmol/L 140  137  139   Potassium 3.5 - 5.1 mmol/L 3.8  3.8  3.3   Chloride 98 - 111 mmol/L 107  103  107   CO2 22 - 32 mmol/L 24  25  23    Calcium 8.9 - 10.3 mg/dL 8.4  8.7  8.5   Total Protein 6.5 - 8.1 g/dL 6.3  6.3  6.2   Total Bilirubin 0.0 - 1.2 mg/dL 0.6  0.4  0.6   Alkaline Phos 38 - 126 U/L 73  84  64   AST 15 - 41 U/L 24  20  25    ALT 0 - 44 U/L 20  19  19       RADIOGRAPHIC STUDIES: I have personally reviewed the radiological images as listed and agreed with the findings in the report. Reviewed findings of MRI abdomen MRCP done at [facility]. CT CHEST ABDOMEN PELVIS W CONTRAST Result Date: 07/04/2023 CLINICAL DATA:  Pancreatic  cancer. Staging. On chemotherapy. * Tracking Code: BO * EXAM: CT CHEST, ABDOMEN, AND PELVIS  WITH CONTRAST TECHNIQUE: Multidetector CT imaging of the chest, abdomen and pelvis was performed following the standard protocol during bolus administration of intravenous contrast. RADIATION DOSE REDUCTION: This exam was performed according to the departmental dose-optimization program which includes automated exposure control, adjustment of the mA and/or kV according to patient size and/or use of iterative reconstruction technique. CONTRAST:  80mL OMNIPAQUE IOHEXOL 300 MG/ML  SOLN COMPARISON:  Outside CT 03/22/2023 and older. Most recent outside CT scan is without report. Clinic note reviewed in epic. FINDINGS: CT CHEST FINDINGS Cardiovascular: Heart is nonenlarged. No significant pericardial effusion. Ectasia of the ascending aorta is noted. This measures up to 4.4 cm at the level of the main pulmonary artery today. Previously 4.4 cm as well. Minimal atherosclerotic calcified plaque along the thoracic aorta particularly the arch and descending. Mild plaque along the origin of the great vessels. Right IJ chest port in place with the tip seen as far as the central SVC above the right atrium. Mediastinum/Nodes: Small thyroid gland. Grossly normal course and caliber of the thoracic esophagus. No specific abnormal lymph node enlargement identified in the thorax including supraclavicular, chest wall, internal mammary chain, axillary regions, hilum or mediastinum. Lungs/Pleura: There is a interval surgical changes along the right lower lobe. The previous nodule in the right lower lobe which measured 12 mm and with subpleural is no longer seen. There is some bandlike opacity in this location with pleural thickening, possibly posttreatment related. There are some other nodules however identified. Example includes left lower lobe measuring 5 mm on series 4, image 108 today. On the prior this measured 3 mm. Multiple juxtapleural  nodules left lower lobe more superiorly such as series 4, image 73 measuring 10 mm today and previously 6 mm. Posterolateral left upper lobe lesion measuring 9 mm on series 4, image 38 today and previously 5 mm. Anterior right midlung lesion measuring 5 mm today on series 4, image 66, inferior right upper lobe and previously 5 mm. No pleural effusion, consolidation pneumothorax otherwise. Musculoskeletal: Mild degenerative changes. CT ABDOMEN PELVIS FINDINGS Hepatobiliary: Subtle low-attenuation lesion along the inferior aspect of the right hepatic lobe which previously measured 9 mm, today is essentially stable on series 2, image 68 of the axial data set. Coronal image 30 of series 5. No new space-occupying liver lesion clearly seen today. Patent portal vein. Gallbladder is absent. Pancreas: Surgical changes from Whipple procedure. 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 No recurrent mass lesion along the surgical margin. Spleen: Tiny low-attenuation splenic lesion on series 2, image 61 is too small to completely characterize but unchanged. Likely a benign cystic lesion. Adrenals/Urinary Tract: Adrenal glands are unremarkable. Kidneys are normal, without renal calculi, focal lesion, or hydronephrosis. Bladder is unremarkable. 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 On this non oral contrast exam the residual stomach, small bowel and large bowel has a normal course and caliber. There is scattered colonic stool identified. Normal retrocecal appendix. Vascular/Lymphatic: Normal caliber aorta and IVC with scattered vascular calcifications. There is some abnormal soft tissue identified in the central mesentery just to the right of the SMV on axial series 2, image 72 measuring 13 by 11 mm. Please see coronal image 27 of series 5. Slight stranding in the mesentery. This structure is just caudal to  the surgical margin. Reproductive: Uterus is present. Left adnexal cystic structures again identified, likely ovarian. Today measuring 5.0 by 3.4 cm and previously 5.4 by 3.8  cm. Other: No free intra-air.  No ascites. Musculoskeletal: Mild degenerative changes along the spine and pelvis. Particularly at L5-S1 of the spine. IMPRESSION: Surgical changes from prior Whipple procedure. Developing abnormal soft tissue in the central mesentery just to the right of the SMV and caudal to the surgical bed worrisome for an area of worsening disease. Multiple small lung nodules identified. Several are slightly larger today as well. Interval surgical changes along the right lower lobe with parenchymal lung opacity and pleural thickening. Stable right hepatic lobe liver lesion. No new liver disease identified. Stable left adnexal, likely ovarian cystic lesion. 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 Recommend annual imaging followup by CTA or MRA. This recommendation follows 2010 ACCF/AHA/AATS/ACR/ASA/SCA/SCAI/SIR/STS/SVM Guidelines for the Diagnosis and Management of Patients with Thoracic Aortic Disease. Circulation. 2010; 121: W098-J191. Aortic aneurysm NOS (ICD10-I71.9) Findings will be called to the ordering service by the Radiology physician assistant team. Electronically Signed   By: Karen Kays M.D.   On: 07/04/2023 16:14

## 2023-07-18 NOTE — Assessment & Plan Note (Addendum)
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  Pathology proved liver metastatic disease- additional chemotherapy --> whipple procedure.ypT1b ypN0--> adjuvant Gem/Abraxane finished in August 2023--> Sept 2024 CT showed progression in lung --> Nov 2024 Wedge biopsy- Recurrent pancreatic cancer with lung metastasis.  Recommend patient to resume systemic chemotherapy with Gemcitabine and Abraxane.  Due to neutropenia, I adjusted regimen to 1 week on 1 week off schedule. Close monitor,  Labs are reviewed and discussed with patient. Proceed with Gemcitabine and Abraxane.

## 2023-07-18 NOTE — Assessment & Plan Note (Signed)
fingertips and toes, grade 1/2.  She did not tolerate Cymbalta.   Stable symptoms. Acupuncture sessions as needed..  

## 2023-07-18 NOTE — Patient Instructions (Signed)
 [facility] - A DEPT OF [facility]  Discharge Instructions: Thank you for choosing [facility] to provide your oncology and hematology care.  If you have a lab appointment with the Cancer Center, please go directly to the Cancer Center and check in at the registration area.  Wear comfortable clothing and clothing appropriate for easy access to any Portacath or PICC line.   We strive to give you quality time with your provider. You may need to reschedule your appointment if you arrive late (15 or more minutes).  Arriving late affects you and other patients whose appointments are after yours.  Also, if you miss three or more appointments without notifying the office, you may be dismissed from the clinic at the provider's discretion.      For prescription refill requests, have your pharmacy contact our office and allow 72 hours for refills to be completed.    Today you received the following chemotherapy and/or immunotherapy agents ABRAXENE and GEMZAR      To help prevent nausea and vomiting after your treatment, we encourage you to take your nausea medication as directed.  BELOW ARE SYMPTOMS THAT SHOULD BE REPORTED IMMEDIATELY: *FEVER GREATER THAN 100.4 F (38 C) OR HIGHER *CHILLS OR SWEATING *NAUSEA AND VOMITING THAT IS NOT CONTROLLED WITH YOUR NAUSEA MEDICATION *UNUSUAL SHORTNESS OF BREATH *UNUSUAL BRUISING OR BLEEDING *URINARY PROBLEMS (pain or burning when urinating, or frequent urination) *BOWEL PROBLEMS (unusual diarrhea, constipation, pain near the anus) TENDERNESS IN MOUTH AND THROAT WITH OR WITHOUT PRESENCE OF ULCERS (sore throat, sores in mouth, or a toothache) UNUSUAL RASH, SWELLING OR PAIN  UNUSUAL VAGINAL DISCHARGE OR ITCHING   Items with * indicate a potential emergency and should be followed up as soon as possible or go to the Emergency Department if any problems should occur.  Please show the CHEMOTHERAPY ALERT CARD or  IMMUNOTHERAPY ALERT CARD at check-in to the Emergency Department and triage nurse.  Should you have questions after your visit or need to cancel or reschedule your appointment, please contact [facility] - A DEPT OF [facility]  [phone] and follow the prompts.  Office hours are 8:00 a.m. to 4:30 p.m. Monday - Friday. Please note that voicemails left after 4:00 p.m. may not be returned until the following business day.  We are closed weekends and major holidays. You have access to a nurse at all times for urgent questions. Please call the main number to the clinic [phone] and follow the prompts.  For any non-urgent questions, you may also contact your provider using MyChart. We now offer e-Visits for anyone 73 and older to request care online for non-urgent symptoms. For details visit [email or web address].   Also download the MyChart app! Go to the app store, search "MyChart", open the app, select [place], and log in with your MyChart username and password. . Paclitaxel Nanoparticle Albumin-Bound Injection What is this medication? NANOPARTICLE ALBUMIN-BOUND PACLITAXEL (Na no PAHR ti kuhl al BYOO muhn-bound PAK li TAX el) treats some types of cancer. It works by slowing down the growth of cancer cells. This medicine may be used for other purposes; ask your health care provider or pharmacist if you have questions. COMMON BRAND NAME(S): Abraxane What should I tell my care team before I take this medication? They need to know if you have any of these conditions: Liver disease Low white blood cell levels An unusual or allergic reaction  to paclitaxel, albumin, other medications, foods, dyes, or preservatives If you or your partner are pregnant or trying to get pregnant Breast-feeding How should I use this medication? This medication is injected into a vein. It is given by your care team in a hospital or clinic setting. Talk to your care team about the  use of this medication in children. Special care may be needed. Overdosage: If you think you have taken too much of this medicine contact a poison control center or emergency room at once. NOTE: This medicine is only for you. Do not share this medicine with others. What if I miss a dose? Keep appointments for follow-up doses. It is important not to miss your dose. Call your care team if you are unable to keep an appointment. What may interact with this medication? Other medications may affect the way this medication works. Talk with your care team about all of the medications you take. They may suggest changes to your treatment plan to lower the risk of side effects and to make sure your medications work as intended. This list may not describe all possible interactions. Give your health care provider a list of all the medicines, herbs, non-prescription drugs, or dietary supplements you use. Also tell them if you smoke, drink alcohol, or use illegal drugs. Some items may interact with your medicine. What should I watch for while using this medication? Your condition will be monitored carefully while you are receiving this medication. You may need blood work while taking this medication. This medication may make you feel generally unwell. This is not uncommon as chemotherapy can affect healthy cells as well as cancer cells. Report any side effects. Continue your course of treatment even though you feel ill unless your care team tells you to stop. This medication can cause serious allergic reactions. To reduce the risk, your care team may give you other medications to take before receiving this one. Be sure to follow the directions from your care team. This medication may increase your risk of getting an infection. Call your care team for advice if you get a fever, chills, sore throat, or other symptoms of a cold or flu. Do not treat yourself. Try to avoid being around people who are sick. This medication  may increase your risk to bruise or bleed. Call your care team if you notice any unusual bleeding. Be careful brushing or flossing your teeth or using a toothpick because you may get an infection or bleed more easily. If you have any dental work done, tell your dentist you are receiving this medication. Talk to your care team if you or your partner may be pregnant. Serious birth defects can occur if you take this medication during pregnancy and for 6 months after the last dose. You will need a negative pregnancy test before starting this medication. Contraception is recommended while taking this medication and for 6 months after the last dose. Your care team can help you find the option that works for you. If your partner can get pregnant, use a condom during sex while taking this medication and for 3 months after the last dose. Do not breastfeed while taking this medication and for 2 weeks after the last dose. This medication may cause infertility. Talk to your care team if you are concerned about your fertility. What side effects may I notice from receiving this medication? Side effects that you should report to your care team as soon as possible: Allergic reactions--skin rash, itching, hives, swelling  of the face, lips, tongue, or throat Dry cough, shortness of breath or trouble breathing Infection--fever, chills, cough, sore throat, wounds that don't heal, pain or trouble when passing urine, general feeling of discomfort or being unwell Low red blood cell level--unusual weakness or fatigue, dizziness, headache, trouble breathing Pain, tingling, or numbness in the hands or feet Stomach pain, unusual weakness or fatigue, nausea, vomiting, diarrhea, or fever that lasts longer than expected Unusual bruising or bleeding Side effects that usually do not require medical attention (report to your care team if they continue or are bothersome): Diarrhea Fatigue Hair loss Loss of  appetite Nausea Vomiting This list may not describe all possible side effects. Call your doctor for medical advice about side effects. You may report side effects to FDA at 1-800-FDA-1088. Where should I keep my medication? This medication is given in a hospital or clinic. It will not be stored at home. NOTE: This sheet is a summary. It may not cover all possible information. If you have questions about this medicine, talk to your doctor, pharmacist, or health care provider.  2024 Elsevier/Gold Standard (2021-10-29 00:00:00)  Gemcitabine Injection What is this medication? GEMCITABINE (jem SYE ta been) treats some types of cancer. It works by slowing down the growth of cancer cells. This medicine may be used for other purposes; ask your health care provider or pharmacist if you have questions. COMMON BRAND NAME(S): Gemzar, Infugem What should I tell my care team before I take this medication? They need to know if you have any of these conditions: Blood disorders Infection Kidney disease Liver disease Lung or breathing disease, such as asthma or COPD Recent or ongoing radiation therapy An unusual or allergic reaction to gemcitabine, other medications, foods, dyes, or preservatives If you or your partner are pregnant or trying to get pregnant Breast-feeding How should I use this medication? This medication is injected into a vein. It is given by your care team in a hospital or clinic setting. Talk to your care team about the use of this medication in children. Special care may be needed. Overdosage: If you think you have taken too much of this medicine contact a poison control center or emergency room at once. NOTE: This medicine is only for you. Do not share this medicine with others. What if I miss a dose? Keep appointments for follow-up doses. It is important not to miss your dose. Call your care team if you are unable to keep an appointment. What may interact with this  medication? Interactions have not been studied. This list may not describe all possible interactions. Give your health care provider a list of all the medicines, herbs, non-prescription drugs, or dietary supplements you use. Also tell them if you smoke, drink alcohol, or use illegal drugs. Some items may interact with your medicine. What should I watch for while using this medication? Your condition will be monitored carefully while you are receiving this medication. This medication may make you feel generally unwell. This is not uncommon, as chemotherapy can affect healthy cells as well as cancer cells. Report any side effects. Continue your course of treatment even though you feel ill unless your care team tells you to stop. In some cases, you may be given additional medications to help with side effects. Follow all directions for their use. This medication may increase your risk of getting an infection. Call your care team for advice if you get a fever, chills, sore throat, or other symptoms of a cold or flu.  Do not treat yourself. Try to avoid being around people who are sick. This medication may increase your risk to bruise or bleed. Call your care team if you notice any unusual bleeding. Be careful brushing or flossing your teeth or using a toothpick because you may get an infection or bleed more easily. If you have any dental work done, tell your dentist you are receiving this medication. Avoid taking medications that contain aspirin, acetaminophen, ibuprofen, naproxen, or ketoprofen unless instructed by your care team. These medications may hide a fever. Talk to your care team if you or your partner wish to become pregnant or think you might be pregnant. This medication can cause serious birth defects if taken during pregnancy and for 6 months after the last dose. A negative pregnancy test is required before starting this medication. A reliable form of contraception is recommended while taking  this medication and for 6 months after the last dose. Talk to your care team about effective forms of contraception. Do not father a child while taking this medication and for 3 months after the last dose. Use a condom while having sex during this time period. Do not breastfeed while taking this medication and for at least 1 week after the last dose. This medication may cause infertility. Talk to your care team if you are concerned about your fertility. What side effects may I notice from receiving this medication? Side effects that you should report to your care team as soon as possible: Allergic reactions--skin rash, itching, hives, swelling of the face, lips, tongue, or throat Capillary leak syndrome--stomach or muscle pain, unusual weakness or fatigue, feeling faint or lightheaded, decrease in the amount of urine, swelling of the ankles, hands, or feet, trouble breathing Infection--fever, chills, cough, sore throat, wounds that don't heal, pain or trouble when passing urine, general feeling of discomfort or being unwell Liver injury--right upper belly pain, loss of appetite, nausea, light-colored stool, dark yellow or brown urine, yellowing skin or eyes, unusual weakness or fatigue Low red blood cell level--unusual weakness or fatigue, dizziness, headache, trouble breathing Lung injury--shortness of breath or trouble breathing, cough, spitting up blood, chest pain, fever Stomach pain, bloody diarrhea, pale skin, unusual weakness or fatigue, decrease in the amount of urine, which may be signs of hemolytic uremic syndrome Sudden and severe headache, confusion, change in vision, seizures, which may be signs of posterior reversible encephalopathy syndrome (PRES) Unusual bruising or bleeding Side effects that usually do not require medical attention (report to your care team if they continue or are bothersome): Diarrhea Drowsiness Hair loss Nausea Pain, redness, or swelling with sores inside the  mouth or throat Vomiting This list may not describe all possible side effects. Call your doctor for medical advice about side effects. You may report side effects to FDA at 1-800-FDA-1088. Where should I keep my medication? This medication is given in a hospital or clinic. It will not be stored at home. NOTE: This sheet is a summary. It may not cover all possible information. If you have questions about this medicine, talk to your doctor, pharmacist, or health care provider.  2024 Elsevier/Gold Standard (2021-10-20 00:00:00)

## 2023-07-18 NOTE — Assessment & Plan Note (Signed)
 S/p  Whipple procedure..  Continue Creon, continue [id number] units TID with meals and  with her snacks

## 2023-07-18 NOTE — Progress Notes (Signed)
Patient has no concerns 

## 2023-07-18 NOTE — Assessment & Plan Note (Signed)
Chemotherapy plan as listed above 

## 2023-07-18 NOTE — Progress Notes (Signed)
[facility] CSW Progress Note  Visual merchandiser met with patient and her sister in infusion to follow up after phone call.  She stated she gave the nurse her HCPOA document to be scanned into her chart.  Also provided CSW contact information for future use.    Dorothey Baseman, LCSW Clinical Social Worker [facility]

## 2023-07-18 NOTE — Assessment & Plan Note (Signed)
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 Continue calcium supplementation, 2000 mg daily-divided in 2-3 doses

## 2023-07-25 ENCOUNTER — Other Ambulatory Visit: Payer: Medicare Other

## 2023-07-25 ENCOUNTER — Ambulatory Visit: Payer: Medicare Other | Admitting: Oncology

## 2023-07-25 ENCOUNTER — Ambulatory Visit: Payer: Medicare Other

## 2023-08-01 ENCOUNTER — Inpatient Hospital Stay (HOSPITAL_BASED_OUTPATIENT_CLINIC_OR_DEPARTMENT_OTHER): Payer: Medicare Other | Admitting: Oncology

## 2023-08-01 ENCOUNTER — Inpatient Hospital Stay: Payer: Medicare Other | Attending: Oncology

## 2023-08-01 ENCOUNTER — Encounter: Payer: Self-pay | Admitting: Oncology

## 2023-08-01 ENCOUNTER — Inpatient Hospital Stay: Payer: Medicare Other

## 2023-08-01 VITALS — BP 131/86 | HR 63 | Temp 98.2°F | Resp 18 | Wt 134.9 lb

## 2023-08-01 DIAGNOSIS — G620 Drug-induced polyneuropathy: Secondary | ICD-10-CM | POA: Diagnosis not present

## 2023-08-01 DIAGNOSIS — Z5111 Encounter for antineoplastic chemotherapy: Secondary | ICD-10-CM | POA: Insufficient documentation

## 2023-08-01 DIAGNOSIS — K8689 Other specified diseases of pancreas: Secondary | ICD-10-CM

## 2023-08-01 DIAGNOSIS — T451X5A Adverse effect of antineoplastic and immunosuppressive drugs, initial encounter: Secondary | ICD-10-CM

## 2023-08-01 DIAGNOSIS — C250 Malignant neoplasm of head of pancreas: Secondary | ICD-10-CM | POA: Diagnosis present

## 2023-08-01 DIAGNOSIS — C259 Malignant neoplasm of pancreas, unspecified: Secondary | ICD-10-CM

## 2023-08-01 DIAGNOSIS — Z79899 Other long term (current) drug therapy: Secondary | ICD-10-CM | POA: Insufficient documentation

## 2023-08-01 DIAGNOSIS — C787 Secondary malignant neoplasm of liver and intrahepatic bile duct: Secondary | ICD-10-CM | POA: Insufficient documentation

## 2023-08-01 LAB — CMP (CANCER CENTER ONLY)
ALT: 21 U/L (ref 0–44)
AST: 26 U/L (ref 15–41)
Albumin: 4 g/dL (ref 3.5–5.0)
Alkaline Phosphatase: 65 U/L (ref 38–126)
Anion gap: 6 (ref 5–15)
BUN: 11 mg/dL (ref 8–23)
CO2: 23 mmol/L (ref 22–32)
Calcium: 8.5 mg/dL — ABNORMAL LOW (ref 8.9–10.3)
Chloride: 109 mmol/L (ref 98–111)
Creatinine: 0.76 mg/dL (ref 0.44–1.00)
GFR, Estimated: >60 mL/min (ref >60)
Glucose, Bld: 139 mg/dL — ABNORMAL HIGH (ref 70–99)
Potassium: 3.8 mmol/L (ref 3.5–5.1)
Sodium: 138 mmol/L (ref 135–145)
Total Bilirubin: 0.7 mg/dL (ref 0.0–1.2)
Total Protein: 6.5 g/dL (ref 6.5–8.1)

## 2023-08-01 LAB — CBC WITH DIFFERENTIAL (CANCER CENTER ONLY)
Abs Immature Granulocytes: 0.02 10*3/uL (ref 0.00–0.07)
Basophils Absolute: 0 10*3/uL (ref 0.0–0.1)
Basophils Relative: 1 %
Eosinophils Absolute: 0.1 10*3/uL (ref 0.0–0.5)
Eosinophils Relative: 2 %
HCT: 34.1 % — ABNORMAL LOW (ref 36.0–46.0)
Hemoglobin: 11.6 g/dL — ABNORMAL LOW (ref 12.0–15.0)
Immature Granulocytes: 1 %
Lymphocytes Relative: 29 %
Lymphs Abs: 1.3 10*3/uL (ref 0.7–4.0)
MCH: 33.5 pg (ref 26.0–34.0)
MCHC: 34 g/dL (ref 30.0–36.0)
MCV: 98.6 fL (ref 80.0–100.0)
Monocytes Absolute: 0.5 10*3/uL (ref 0.1–1.0)
Monocytes Relative: 11 %
Neutro Abs: 2.5 10*3/uL (ref 1.7–7.7)
Neutrophils Relative %: 56 %
Platelet Count: 179 10*3/uL (ref 150–400)
RBC: 3.46 MIL/uL — ABNORMAL LOW (ref 3.87–5.11)
RDW: 13.6 % (ref 11.5–15.5)
WBC Count: 4.4 10*3/uL (ref 4.0–10.5)
nRBC: 0 % (ref 0.0–0.2)

## 2023-08-01 MED ORDER — PACLITAXEL PROTEIN-BOUND CHEMO INJECTION 100 MG
100.0000 mg/m2 | Freq: Once | INTRAVENOUS | Status: AC
  Administered 2023-08-01: 175 mg via INTRAVENOUS
  Filled 2023-08-01: qty 35

## 2023-08-01 MED ORDER — SODIUM CHLORIDE 0.9 % IV SOLN
INTRAVENOUS | Status: DC
Start: 2023-08-01 — End: 2023-08-01
  Filled 2023-08-01: qty 250

## 2023-08-01 MED ORDER — PROCHLORPERAZINE MALEATE 10 MG PO TABS
10.0000 mg | ORAL_TABLET | Freq: Once | ORAL | Status: AC
  Administered 2023-08-01: 10 mg via ORAL
  Filled 2023-08-01: qty 1

## 2023-08-01 MED ORDER — SODIUM CHLORIDE 0.9% FLUSH
10.0000 mL | INTRAVENOUS | Status: DC | PRN
  Administered 2023-08-01: 10 mL
  Filled 2023-08-01: qty 10

## 2023-08-01 MED ORDER — LIDOCAINE-PRILOCAINE 2.5-2.5 % EX CREA: TOPICAL_CREAM | CUTANEOUS | 6 refills | Status: AC

## 2023-08-01 MED ORDER — SODIUM CHLORIDE 0.9 % IV SOLN
1000.0000 mg/m2 | Freq: Once | INTRAVENOUS | Status: AC
  Administered 2023-08-01: 1710 mg via INTRAVENOUS
  Filled 2023-08-01: qty 44.97

## 2023-08-01 MED ORDER — HEPARIN SOD (PORK) LOCK FLUSH 100 UNIT/ML IV SOLN
500.0000 [IU] | Freq: Once | INTRAVENOUS | Status: AC | PRN
  Administered 2023-08-01: 500 [IU]
  Filled 2023-08-01: qty 5

## 2023-08-01 NOTE — Assessment & Plan Note (Signed)
 Chemotherapy plan as listed above

## 2023-08-01 NOTE — Assessment & Plan Note (Signed)
fingertips and toes numbness, grade 2.  She did not tolerate Cymbalta.   Stable symptom Recommend Acupuncture sessions as needed.Marland Kitchen

## 2023-08-01 NOTE — Progress Notes (Signed)
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  Pathology proved liver metastatic disease- additional chemotherapy --> whipple procedure.ypT1b ypN0--> adjuvant Gem/Abraxane finished in August 2023--> Sept 2024 CT showed progression in lung --> Nov 2024 Wedge biopsy- Recurrent pancreatic cancer with lung metastasis.  Currently on Gemcitabine and Abraxane 1 week on 1 week off schedule due to neutropenia.  Labs are reviewed and discussed with patient. Proceed with Gemcitabine and Abraxane.    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  She did not tolerate Cymbalta.   Stable symptom Recommend Acupuncture sessions as needed..   Encounter for antineoplastic chemotherapy Chemotherapy plan as listed above  Hypocalcemia Likely due to malabsorption. Continue calcium supplementation, 2000 mg daily-divided in 2-3 doses   Pancreatic insufficiency S/p  Whipple procedure..  Continue Creon, continue 62130 units TID with meals and  with her snacks     Orders Placed This Encounter  Procedures   Cancer antigen 19-9    Standing Status:   Future    Expected Date:   08/15/2023    Expiration Date:   08/14/2024   CEA    Standing Status:   Future    Expected Date:   08/15/2023    Expiration Date:   08/14/2024    Follow up  2 weeks lab MD Gemcitaibine Abraxane    All questions were answered. The patient knows to call the clinic with any problems,  questions or concerns.  [name], MD, PhD [facility] Hematology Oncology 08/01/2023   [name], MD    HISTORY OF PRESENTING ILLNESS:   Cassandra [name] is a  68 y.o.  female with presents for follow up of Stage IV pancreatic adenocarcinoma Patient initially presented with jaundice, transaminitis, bilirubin was 9.9.  CA 19-9 was 1874.  Patient also reports unintentional weight loss. 02/08/2020 MRI abdomen and MRCP with and without contrast was done at [facility] which showed pancreatic head mass measuring up to 3 cm, with marked associated narrowing of the portal vein confluence.  SMA is preserved.  Marked intrahepatic and extrahepatic biliary duct dilatation as well as mild dilatation of the main pancreatic duct. Multiple hepatic masses highly concerning for metastatic disease.  Patient underwent EUS on 02/19/2020, which showed irregular mass identified in the pancreatic head, hypoechoic, measured 64mmx33mm, sonographic evidence concerning for invasion into the superior mesenteric artery.  There is no sign of significant abnormality in the main pancreatic duct.  Dilatation of common bile duct which measured up to 16 mm.  Region of celiac artery was visualized and showed no signs of significant abnormality.  No lymphadenopathy.  FNA showed adenocarcinoma.  02/19/2020, ERCP, malignant.  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  The stricture was treated with placement of wall flex metal stent.  Patient was seen by [facility] oncology Dr. Olga Millers and was recommended for 3 drug regimen FOLFIRINOX.  Patient prefers to do chemotherapy locally at [facility].  Patient was referred to establish care today. She denies any pain.  Since stent placement, skin  jaundice has improved.  Itchiness has also improved. Patient was accompanied by her husband today.  She has a family history of breast cancer in sister and paternal aunt, colon cancer paternal grandmother.  #No  reportable targetable mutation on NGS 9/14/2021cycle 1 FOLFIRINOX.  Patient received oxaliplatin and about 50% of Irinotecan on day 1 and had experienced neurologic symptoms.  She went to ER and working diagnosis is TIA, and eventually I think this is due to irinotecan side effects -irinotecan-associated dysarthria, lip/tongue numbness.  Adjustment was made for Irinotecan to be  infused over 180 minutes.  Atropine 0.5 mg once prior to the irinotecan. No recurrent symptoms.   # 03/19/2020-03/21/2020 patient was admitted due to sepsis with strep pneumonia bacteremia.  Patient was treated with IV Rocephin.  TEE was done which showed no vegetation.  No PFO or ASD.  Patient was discharged home and he finished full course of 14 days of IV Rocephin on 04/02/2020 per ID recommendation.  Repeat blood culture was also negative.  08/25/20 cycle 10 FOLFIRINOX 09/08/20- present  Starting cycle 11, FOLFIRI, Oxaliplatin discontinued due to neuropathy   #NGS showed no reportable targetable mutation #Genetic testing-[employer] diagnostic testing showed no pathological variants identified. MS stable, TMB 79mut/mb, KRAS G12D, SF3B1 K700E, TP53 V290fs*30  #01/14/2021  CT done at [facility] during the interval, stable disease.  # Her case was presented by Dr.Jia from [facility] tumor board. Clayborne Artist were 4~5 liver lesions on OSH MRI last year at the time of diagnosis highly suspicious for metastasis. She is not eligible for surgical protocol for metastasis resection given the number of liver metastasis is >3. However given her excellent and durable response to chemo, surgical resection may be considered pending sustained disease control at 1 year and may require partial hepatectomy first to see if there is viable tumor before proceeding to Whipple resection ]  Patient had a COVID-19 infection in September 2022.  03/23/2021 CT abdomen pelvis No significant change in the ill-defined pancreatic head mass or  associated biliary ductal and  pancreatic ductal dilation.  Slight interval enlargement of subcentimeter anterior peripancreatic  lymph node, nonspecific. Attention on follow-up per clinical protocol. Similar enlargement of the ascending thoracic aorta measuring 4.4 cm  03/23/2021 MRI abdomen w and wo  Increasing ill-defined hypoenhancement of the pancreatic head surrounding the common bile duct stent, in keeping with known pancreatic malignancy. Anterior peripancreatic lymph node is better evaluated on CT.  Unchanged position of a common bile duct stent with similar mild diffuse intrahepatic ductal dilation and pneumobilia. No evidence of metastatic disease in the abdomen or pelvis. Partially visualized cystic left adnexal lesion measuring up to 3.9 cm.   04/10/2021, patient underwent liver resection at [facility], by Dr. [name] Ducking.   Pathology report from [facility] was reviewed. Segments 3 partial hepatectomy, negative for viable tumor. Section 5/6 partial hepatectomy part 1 and 2, microscopic foci of residual viable adenocarcinoma, morphologically consistent with history of pancreatic primary.  Parenchymal margin is uninvolved.  #Patient resumed on FOLFIRI on 04/28/2021. She got another cycle on 05/12/2021 #05/26/2021, patient did not get additional chemotherapy due to transaminitis.  Shared decision was made to stop FOLFIRI and switch to gemcitabine Abraxane treatments.  05/26/2021, patient developed transaminitis, AST 763, ALT 691, alkaline phosphatase 364.  Bilirubin 0.7 05/26/2021 stat ultrasound abdomen right upper quadrant showed interval development of 3.3 x 1.8 cm complex mass in the right hepatic lobe.  Increased extrahepatic ductal dilatation.  Concerning for CBD obstruction. 05/29/2021, MRI abdomen MRCP with and without contrast  showed unchanged pancreatic head soft tissue.  Severe intra and extrahepatic biliary ductal dilatation.  Common bile duct stents remain in position however patency is not established.  No evidence of  lymphadenopathy or metastatic disease in the abdomen.  With further communication with radiologist, addendum was added that there is internal fluid signal and no appreciable associated contrast enhancement measuring 3.4 x 2.3 cm.  This appearance is generally not consistent with metastasis and is of uncertain significance.  Possibly reflecting hepatic abscess or residual of subcapsular hematoma.  Patient was seen by [facility] Dr. Elon Jester team.  He had ERCP done on 06/05/2021, with findings of One stent from the biliary tree was seen in the major papilla. The stent had migrated significantly into the duodenum. This is the cause of stent malfunction. One stent was removed from the biliary tree. Prior biliary sphincterotomy appeared open. - A single severe biliary stricture was found in the lower third of the main bile duct with upstream dilation. The stricture was alignant appearing.- The biliary tree was swept and sludge was found.- One fully covered metal stent was placed into the common bile duct across the stricture.- No pancreatogram performed.  06/12/2021-08/10/2021 gemcitabine and Abraxane.   10/14/2021, patient underwent Whipple procedure. Liver biopsy negative for malignancy. Review procedure showed invasive adenocarcinoma, moderately differentiated, centered in pancreatic head and confined in the Pancreas. Surgical margin is negative for malignancy.  36 lymph nodes were all negative for malignancy.  1 hepatic artery lymph node was harvested and was negative.  Gallbladder negative for malignancy.  Cystic duct excision negative for malignancy. pT1b pN0  10/25/2021, CT abdomen pelvis with contrast showed rim-enhancing fluid collection in the region of the hepatic hilum. Patient had a readmission for superficial wound infection.  She completed antibiotics on 11/03/2021.  11/03/2021, CT abdomen pelvis with contrast showed near resolution of previously seen fluid collection in the region of the hepatic hilum.   Similar appearance of the area of hypoenhancement in the liver.  Attention on follow-up.  11/17/2021, resumed on gemcitabine/Abraxane. 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  12/12/ 2023 CT scan at [facility] shows no evidence of cancer recurrence.-NED 09/21/2022 CT chest abdomen pelvis w contrast at [facility] showed-No evidence of metastatic disease in the chest, abdomen, or pelvis. 12/21/2022 CT chest abdomen pelvis w contrast at [facility] showed-No evidence of metastatic disease in the chest, abdomen, or pelvis.   03/22/2023 CT scan at [facility] showed enlarging lung nodules. RLL 1cm, LLL 0.5cm, RUL 0.5cm Stable hypoattenuating right inferior hepatic lobe lesion measuring 0.9 cm.  Similar left adnexal cystic lesion measuring up to 5.0 cm.   04/06/2023 s/p lung nodule biopsy.  Rare atypical epithelioid cells present with evidence suggestive of invasion, suspicious but not diagnostic for malignancy.   05/20/2023: Right robot assisted thoracoscopic wedge resection with MLND (Dr. Rhona Raider)   Pathology:  Suspicious for malignancy but not diagnostic A. Lymph node, level 8, biopsy: Metastatic carcinoma involving one lymph node (1/1).  B. Lung, right lower lobe, wedge resection: Lung tissue positive for adenocarcinoma, consistent with metastasis from pancreas primary. See comment. Tumor size: 1.0 cm. Operative margin: Negative  Comment: Note is made of the patient's history of invasive adenocarcinoma of the pancreatic head ([id number]; 10/14/2021), which is compared to the current case. Comparison is challenging given the post-treatment nature of the pancreatic tumor; however, there are some morphologic similarities.  Although often unhelpful in the setting of lung primary versus pancreatic primary, immunohistochemical stains were performed. Tumor cells demonstrate the below immunoprofile  which would could support pulmonary adenocarcinoma with enteric differentiation but would also be consistent with metastasis from  the patient's known pancreatic primary; the latter is favored.              Positive                             Negative CK7 (strong, diffuse) TTF-1  CK20 (strong, focal)                    Positive and negative controls perform appropriately.    C. Lymph node, level 7, biopsy: Metastatic carcinoma involving one lymph node (1/1).  D. Lymph node, level 4R, biopsy: One lymph node, negative for malignancy (0/1).   INTERVAL HISTORY ROSEMAE MCQUOWN is a 68 y.o. female who has above history reviewed by me today presents for stage IV pancreatic cancer.   She tolerates Gemcitabine Abraxane 1 week on and 1 week off   She feels tired after chemotherapy. + chronic neuropathy /numbness on her toes, constant.   She takes calcium supplementation as instructed.  She take Creon for pancreatic insufficiency.    Review of Systems  Constitutional:  Positive for fatigue. Negative for appetite change, chills, fever and unexpected weight change.  HENT:   Negative for hearing loss and voice change.   Eyes:  Negative for eye problems.  Respiratory:  Negative for chest tightness and cough.   Cardiovascular:  Negative for chest pain.  Gastrointestinal:  Negative for abdominal distention, abdominal pain, blood in stool and nausea.  Endocrine: Negative for hot flashes.  Genitourinary:  Negative for difficulty urinating and frequency.   Musculoskeletal:  Negative for arthralgias.       Leg cramps  Skin:  Negative for itching and rash.  Neurological:  Positive for numbness. Negative for extremity weakness.  Hematological:  Negative for adenopathy.  Psychiatric/Behavioral:  Negative for confusion.     MEDICAL HISTORY:  Past Medical History:  Diagnosis Date   Allergy    Anemia    Cancer (HCC)    pancreatic cancer   Colon polyps    Family history of breast cancer    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   Neutropenia (HCC) 04/18/2020   Osteopenia after menopause 05/2017   femoral neck T score  -2.0   Personal history of chemotherapy    current for pancreatic ca   PONV (postoperative nausea and vomiting)    Pure hypercholesterolemia     SURGICAL HISTORY: Past Surgical History:  Procedure Laterality Date   COLONOSCOPY  07/2015   WNL   COLONOSCOPY  2008/2011   COLONOSCOPY WITH PROPOFOL N/A 07/10/2021   Procedure: COLONOSCOPY WITH PROPOFOL;  Surgeon: Regis Bill, MD;  Location: [facility] ENDOSCOPY;  Service: Endoscopy;  Laterality: N/A;   IR CV LINE INJECTION  04/28/2021   OVARIAN CYST REMOVAL  1992   dermoid-Dr CAK   PORTA CATH INSERTION N/A 03/07/2020   Procedure: PORTA CATH INSERTION;  Surgeon: Annice Needy, MD;  Location: [facility] INVASIVE CV LAB;  Service: Cardiovascular;  Laterality: N/A;   TEE WITHOUT CARDIOVERSION N/A 03/21/2020   Procedure: TRANSESOPHAGEAL ECHOCARDIOGRAM (TEE);  Surgeon: Laurier Nancy, MD;  Location: [facility] ORS;  Service: Cardiovascular;  Laterality: N/A;   TUBAL LIGATION  1993    SOCIAL HISTORY: Social History   Socioeconomic History   Marital status: Married    Spouse name: Not on file   Number  of children: 2   Years of education: Not on file   Highest education level: Not on file  Occupational History   Occupation: Teacher    Comment: retired   Occupation: Visual merchandiser  Tobacco Use   Smoking status: Former    Current packs/day: 0.00    Average packs/day: 1 pack/day for 10.0 years (10.0 ttl pk-yrs)    Types: Cigarettes    Start date: 06/29/1975    Quit date: 06/28/1985    Years since quitting: 38.1   Smokeless tobacco: Never   Tobacco comments:    Quit smoking 1987  Vaping Use   Vaping status: Never Used  Substance and Sexual Activity   Alcohol use: Yes    Alcohol/week: 5.0 standard drinks of alcohol    Types: 5 Glasses of wine per week    Comment: 0-2 mixed drinks a day   Drug use: No   Sexual activity: Not Currently    Birth control/protection: Post-menopausal  Other Topics Concern   Not on file  Social History Narrative   Not on  file   Social Drivers of Health   Financial Resource Strain: Low Risk  (05/20/2023)   Received from [facility]   Overall Financial Resource Strain (CARDIA)    Difficulty of Paying Living Expenses: Not hard at all  Food Insecurity: No Food Insecurity (05/24/2023)   Hunger Vital Sign    Worried About Running Out of Food in the Last Year: Never true    Ran Out of Food in the Last Year: Never true  Transportation Needs: No Transportation Needs (05/24/2023)   PRAPARE - Administrator, Civil Service (Medical): No    Lack of Transportation (Non-Medical): No  Physical Activity: Insufficiently Active (10/09/2022)   Exercise Vital Sign    Days of Exercise per Week: 4 days    Minutes of Exercise per Session: 30 min  Stress: No Stress Concern Present (10/09/2022)   [employer] of Occupational Health - Occupational Stress Questionnaire    Feeling of Stress : Not at all  Social Connections: Unknown (10/09/2022)   Social Connection and Isolation Panel [NHANES]    Frequency of Communication with Friends and Family: More than three times a week    Frequency of Social Gatherings with Friends and Family: More than three times a week    Attends Religious Services: Not on Insurance claims handler of Clubs or Organizations: No    Attends Banker Meetings: Never    Marital Status: Married  Catering manager Violence: Not At Risk (05/24/2023)   Humiliation, Afraid, Rape, and Kick questionnaire    Fear of Current or Ex-Partner: No    Emotionally Abused: No    Physically Abused: No    Sexually Abused: No    FAMILY HISTORY: Family History  Problem Relation Age of Onset   Breast cancer Paternal Aunt 23   Diabetes Mother    Osteoporosis Mother    Hyperlipidemia Father    Rheumatic fever Father    Valvular heart disease Father    Cancer Paternal Grandmother        possible colon   Breast cancer Sister 78    ALLERGIES:  is allergic to penicillin  g.  MEDICATIONS:  Current Outpatient Medications  Medication Sig Dispense Refill   b complex vitamins capsule Take 1 capsule by mouth daily.     Calcium Carbonate-Vit D-Min (CALCIUM 1200 PO) Take by mouth.     Cholecalciferol 25 MCG (1000 UT)  tablet Take 2,000 Units by mouth daily.     clobetasol cream (TEMOVATE) 0.05 %      fluticasone (FLONASE) 50 MCG/ACT nasal spray Place 1 spray into both nostrils daily.     Melatonin 2.5 MG CHEW Chew by mouth.     Multiple Vitamins-Minerals (MULTIVITAMIN WITH MINERALS) tablet Take 1 tablet by mouth daily.     Pancrelipase, Lip-Prot-Amyl, (CREON) 24000-76000 units CPEP TAKE 1 CAPSULE THREE TIMES DAILY BEFORE MEALS 200 capsule 3   senna-docusate (SENOKOT-S) 8.6-50 MG tablet Take by mouth.     simethicone (GAS-X) 80 MG chewable tablet Chew 1 tablet (80 mg total) by mouth every 8 (eight) hours as needed for flatulence. 60 tablet 0   lidocaine-prilocaine (EMLA) cream Apply small amount to port and cover with saran wrap 1-2 hours prior to port access 30 g 6   ondansetron (ZOFRAN) 8 MG tablet Take 1 tablet (8 mg total) by mouth every 8 (eight) hours as needed for nausea or vomiting. (Patient not taking: Reported on 08/01/2023) 30 tablet 1   ondansetron (ZOFRAN-ODT) 4 MG disintegrating tablet Take 4 mg by mouth every 8 (eight) hours as needed. (Patient not taking: Reported on 08/01/2023)     polyethylene glycol powder (GLYCOLAX/MIRALAX) 17 GM/SCOOP powder Take by mouth. (Patient not taking: Reported on 08/01/2023)     potassium chloride SA (KLOR-CON M) 20 MEQ tablet Take 1 tablet (20 mEq total) by mouth daily for 5 doses. (Patient not taking: Reported on 06/30/2023) 5 tablet 0   prochlorperazine (COMPAZINE) 10 MG tablet Take 1 tablet (10 mg total) by mouth every 6 (six) hours as needed for nausea or vomiting. (Patient not taking: Reported on 06/17/2023) 30 tablet 1   No current facility-administered medications for this visit.   Facility-Administered Medications Ordered  in Other Visits  Medication Dose Route Frequency Provider Last Rate Last Admin   0.9 %  sodium chloride infusion   Intravenous Continuous [name], MD 10 mL/hr at 08/01/23 0910 New Bag at 08/01/23 0910   gemcitabine (GEMZAR) 1,710 mg in sodium chloride 0.9 % 250 mL chemo infusion  1,000 mg/m2 (Order-Specific) Intravenous Once [name], MD       heparin lock flush 100 UNIT/ML injection            heparin lock flush 100 unit/mL  500 Units Intracatheter Once PRN [name], MD       PACLitaxel-protein bound (ABRAXANE) chemo infusion 175 mg  100 mg/m2 (Order-Specific) Intravenous Once [name], MD       prochlorperazine (COMPAZINE) 10 MG tablet            sodium chloride flush (NS) 0.9 % injection 10 mL  10 mL Intravenous PRN [name], MD   10 mL at 02/18/21 0858   sodium chloride flush (NS) 0.9 % injection 10 mL  10 mL Intravenous Once Borders, Daryl Eastern, NP       sodium chloride flush (NS) 0.9 % injection 10 mL  10 mL Intravenous Once [name], MD       sodium chloride flush (NS) 0.9 % injection 10 mL  10 mL Intracatheter PRN [name], MD         PHYSICAL EXAMINATION: ECOG PERFORMANCE STATUS: 0 - Asymptomatic Vitals:   08/01/23 0823  BP: 131/86  Pulse: 63  Resp: 18  Temp: 98.2 F (36.8 C)   Filed Weights   08/01/23 0823  Weight: 134 lb 14.4 oz (61.2 kg)    Physical Exam Constitutional:  General: She is not in acute distress. HENT:     Head: Normocephalic and atraumatic.  Eyes:     General: No scleral icterus. Cardiovascular:     Rate and Rhythm: Normal rate and regular rhythm.     Heart sounds: Normal heart sounds.  Pulmonary:     Effort: Pulmonary effort is normal. No respiratory distress.  Abdominal:     General: Bowel sounds are normal. There is no distension.     Palpations: Abdomen is soft.  Musculoskeletal:        General: No deformity. Normal range of motion.     Cervical back: Normal range of motion and neck supple.  Skin:    General: Skin is warm.      Coloration: Skin is not jaundiced.     Findings: No erythema.  Neurological:     Mental Status: She is alert and oriented to person, place, and time. Mental status is at baseline.  Psychiatric:        Mood and Affect: Mood normal.     LABORATORY DATA:  I have reviewed the data as listed    Latest Ref Rng & Units 08/01/2023    8:08 AM 07/18/2023    8:21 AM 07/07/2023   10:14 AM  CBC  WBC 4.0 - 10.5 K/uL 4.4  5.5  4.1   Hemoglobin 12.0 - 15.0 g/dL 82.9  56.2  13.0   Hematocrit 36.0 - 46.0 % 34.1  34.0  32.8   Platelets 150 - 400 K/uL 179  267  246       Latest Ref Rng & Units 08/01/2023    8:08 AM 07/18/2023    8:21 AM 06/30/2023    1:29 PM  CMP  Glucose 70 - 99 mg/dL 865  784  696   BUN 8 - 23 mg/dL 11  12  14    Creatinine 0.44 - 1.00 mg/dL 2.95  2.84  1.32   Sodium 135 - 145 mmol/L 138  140  137   Potassium 3.5 - 5.1 mmol/L 3.8  3.8  3.8   Chloride 98 - 111 mmol/L 109  107  103   CO2 22 - 32 mmol/L 23  24  25    Calcium 8.9 - 10.3 mg/dL 8.5  8.4  8.7   Total Protein 6.5 - 8.1 g/dL 6.5  6.3  6.3   Total Bilirubin 0.0 - 1.2 mg/dL 0.7  0.6  0.4   Alkaline Phos 38 - 126 U/L 65  73  84   AST 15 - 41 U/L 26  24  20    ALT 0 - 44 U/L 21  20  19       RADIOGRAPHIC STUDIES: I have personally reviewed the radiological images as listed and agreed with the findings in the report. Reviewed findings of MRI abdomen MRCP done at [facility]. CT CHEST ABDOMEN PELVIS W CONTRAST Result Date: 07/04/2023 CLINICAL DATA:  Pancreatic cancer. Staging. On chemotherapy. * Tracking Code: BO * EXAM: CT CHEST, ABDOMEN, AND PELVIS WITH CONTRAST TECHNIQUE: Multidetector CT imaging of the chest, abdomen and pelvis was performed following the standard protocol during bolus administration of intravenous contrast. RADIATION DOSE REDUCTION: This exam was performed according to the departmental dose-optimization program which includes automated exposure control, adjustment of the mA and/or kV according to patient size and/or  use of iterative reconstruction technique. CONTRAST:  80mL OMNIPAQUE IOHEXOL 300 MG/ML  SOLN COMPARISON:  Outside CT 03/22/2023 and older. Most recent outside CT scan is without report. Clinic note  reviewed in epic. FINDINGS: CT CHEST FINDINGS Cardiovascular: Heart is nonenlarged. No significant pericardial effusion. Ectasia of the ascending aorta is noted. This measures up to 4.4 cm at the level of the main pulmonary artery today. Previously 4.4 cm as well. Minimal atherosclerotic calcified plaque along the thoracic aorta particularly the arch and descending. Mild plaque along the origin of the great vessels. Right IJ chest port in place with the tip seen as far as the central SVC above the right atrium. Mediastinum/Nodes: Small thyroid gland. Grossly normal course and caliber of the thoracic esophagus. No specific abnormal lymph node enlargement identified in the thorax including supraclavicular, chest wall, internal mammary chain, axillary regions, hilum or mediastinum. Lungs/Pleura: There is a interval surgical changes along the right lower lobe. The previous nodule in the right lower lobe which measured 12 mm and with subpleural is no longer seen. There is some bandlike opacity in this location with pleural thickening, possibly posttreatment related. There are some other nodules however identified. Example includes left lower lobe measuring 5 mm on series 4, image 108 today. On the prior this measured 3 mm. Multiple juxtapleural nodules left lower lobe more superiorly such as series 4, image 73 measuring 10 mm today and previously 6 mm. Posterolateral left upper lobe lesion measuring 9 mm on series 4, image 38 today and previously 5 mm. Anterior right midlung lesion measuring 5 mm today on series 4, image 66, inferior right upper lobe and previously 5 mm. No pleural effusion, consolidation pneumothorax otherwise. Musculoskeletal: Mild degenerative changes. CT ABDOMEN PELVIS FINDINGS Hepatobiliary: Subtle  low-attenuation lesion along the inferior aspect of the right hepatic lobe which previously measured 9 mm, today is essentially stable on series 2, image 68 of the axial data set. Coronal image 30 of series 5. No new space-occupying liver lesion clearly seen today. Patent portal vein. Gallbladder is absent. Pancreas: Surgical changes from Whipple procedure. 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 No recurrent mass lesion along the surgical margin. Spleen: Tiny low-attenuation splenic lesion on series 2, image 61 is too small to completely characterize but unchanged. Likely a benign cystic lesion. Adrenals/Urinary Tract: Adrenal glands are unremarkable. Kidneys are normal, without renal calculi, focal lesion, or hydronephrosis. Bladder is unremarkable. 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 On this non oral contrast exam the residual stomach, small bowel and large bowel has a normal course and caliber. There is scattered colonic stool identified. Normal retrocecal appendix. Vascular/Lymphatic: Normal caliber aorta and IVC with scattered vascular calcifications. There is some abnormal soft tissue identified in the central mesentery just to the right of the SMV on axial series 2, image 72 measuring 13 by 11 mm. Please see coronal image 27 of series 5. Slight stranding in the mesentery. This structure is just caudal to the surgical margin. Reproductive: Uterus is present. Left adnexal cystic structures again identified, likely ovarian. Today measuring 5.0 by 3.4 cm and previously 5.4 by 3.8 cm. Other: No free intra-air.  No ascites. Musculoskeletal: Mild degenerative changes along the spine and pelvis. Particularly at L5-S1 of the spine. IMPRESSION: Surgical changes from prior Whipple procedure. Developing abnormal soft tissue in the central mesentery just to the right of the SMV and caudal to  the surgical bed worrisome for an area of worsening disease. Multiple small lung nodules identified. Several are slightly larger today as well. Interval surgical changes along the right lower lobe with parenchymal lung opacity and pleural  thickening. Stable right hepatic lobe liver lesion. No new liver disease identified. Stable left adnexal, likely ovarian cystic lesion. 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 Recommend annual imaging followup by CTA or MRA. This recommendation follows 2010 ACCF/AHA/AATS/ACR/ASA/SCA/SCAI/SIR/STS/SVM Guidelines for the Diagnosis and Management of Patients with Thoracic Aortic Disease. Circulation. 2010; 121: Y865-H846. Aortic aneurysm NOS (ICD10-I71.9) Findings will be called to the ordering service by the Radiology physician assistant team. Electronically Signed   By: Karen Kays M.D.   On: 07/04/2023 16:14

## 2023-08-01 NOTE — Patient Instructions (Signed)
[facility]  Discharge Instructions: Thank you for choosing [facility] to provide your oncology and hematology care.  If you have a lab appointment with the Cancer Center, please go directly to the Cancer Center and check in at the registration area.  Wear comfortable clothing and clothing appropriate for easy access to any Portacath or PICC line.   We strive to give you quality time with your provider. You may need to reschedule your appointment if you arrive late (15 or more minutes).  Arriving late affects you and other patients whose appointments are after yours.  Also, if you miss three or more appointments without notifying the office, you may be dismissed from the clinic at the provider's discretion.      For prescription refill requests, have your pharmacy contact our office and allow 72 hours for refills to be completed.    Today you received the following chemotherapy and/or immunotherapy agents- abraxane, gemzar      To help prevent nausea and vomiting after your treatment, we encourage you to take your nausea medication as directed.  BELOW ARE SYMPTOMS THAT SHOULD BE REPORTED IMMEDIATELY: *FEVER GREATER THAN 100.4 F (38 C) OR HIGHER *CHILLS OR SWEATING *NAUSEA AND VOMITING THAT IS NOT CONTROLLED WITH YOUR NAUSEA MEDICATION *UNUSUAL SHORTNESS OF BREATH *UNUSUAL BRUISING OR BLEEDING *URINARY PROBLEMS (pain or burning when urinating, or frequent urination) *BOWEL PROBLEMS (unusual diarrhea, constipation, pain near the anus) TENDERNESS IN MOUTH AND THROAT WITH OR WITHOUT PRESENCE OF ULCERS (sore throat, sores in mouth, or a toothache) UNUSUAL RASH, SWELLING OR PAIN  UNUSUAL VAGINAL DISCHARGE OR ITCHING   Items with * indicate a potential emergency and should be followed up as soon as possible or go to the Emergency Department if any problems should occur.  Please show the CHEMOTHERAPY ALERT CARD or  IMMUNOTHERAPY ALERT CARD at check-in to the Emergency Department and triage nurse.  Should you have questions after your visit or need to cancel or reschedule your appointment, please contact [facility] - A DEPT OF [facility]  [phone] and follow the prompts.  Office hours are 8:00 a.m. to 4:30 p.m. Monday - Friday. Please note that voicemails left after 4:00 p.m. may not be returned until the following business day.  We are closed weekends and major holidays. You have access to a nurse at all times for urgent questions. Please call the main number to the clinic [phone] and follow the prompts.  For any non-urgent questions, you may also contact your provider using MyChart. We now offer e-Visits for anyone 38 and older to request care online for non-urgent symptoms. For details visit [email or web address].   Also download the MyChart app! Go to the app store, search "MyChart", open the app, select [place], and log in with your MyChart username and password.

## 2023-08-01 NOTE — Assessment & Plan Note (Signed)
 S/p  Whipple procedure..  Continue Creon, continue [id number] units TID with meals and  with her snacks

## 2023-08-01 NOTE — Progress Notes (Signed)
Pt here for follow up. No ne concerns voiced.

## 2023-08-01 NOTE — Assessment & Plan Note (Signed)
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  Pathology proved liver metastatic disease- additional chemotherapy --> whipple procedure.ypT1b ypN0--> adjuvant Gem/Abraxane finished in August 2023--> Sept 2024 CT showed progression in lung --> Nov 2024 Wedge biopsy- Recurrent pancreatic cancer with lung metastasis.  Currently on Gemcitabine and Abraxane 1 week on 1 week off schedule due to neutropenia.  Labs are reviewed and discussed with patient. Proceed with Gemcitabine and Abraxane.

## 2023-08-01 NOTE — Assessment & Plan Note (Signed)
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 Continue calcium supplementation, 2000 mg daily-divided in 2-3 doses

## 2023-08-04 ENCOUNTER — Encounter: Payer: Self-pay | Admitting: Family Medicine

## 2023-08-04 ENCOUNTER — Ambulatory Visit: Payer: Medicare Other | Admitting: Family Medicine

## 2023-08-04 VITALS — BP 128/78 | HR 60 | Ht 67.0 in | Wt 134.6 lb

## 2023-08-04 DIAGNOSIS — R739 Hyperglycemia, unspecified: Secondary | ICD-10-CM

## 2023-08-04 DIAGNOSIS — Z780 Asymptomatic menopausal state: Secondary | ICD-10-CM

## 2023-08-04 DIAGNOSIS — C259 Malignant neoplasm of pancreas, unspecified: Secondary | ICD-10-CM

## 2023-08-04 DIAGNOSIS — M8580 Other specified disorders of bone density and structure, unspecified site: Secondary | ICD-10-CM | POA: Diagnosis not present

## 2023-08-04 DIAGNOSIS — E785 Hyperlipidemia, unspecified: Secondary | ICD-10-CM | POA: Diagnosis not present

## 2023-08-04 DIAGNOSIS — E559 Vitamin D deficiency, unspecified: Secondary | ICD-10-CM

## 2023-08-04 NOTE — Assessment & Plan Note (Signed)
 Reviewed last lipid panel Not currently on a statin Recheck FLP and CMP Discussed diet and exercise

## 2023-08-04 NOTE — Progress Notes (Signed)
 Established patient visit   Patient: Cassandra [name]   DOB: 05-24-1956   68 y.o. Female  MRN: [id number] Visit Date: 08/04/2023  Today's healthcare provider: Jon Eva, MD   Chief Complaint  Patient presents with   Medical Management of Chronic Issues    Patient reports to take all medications as prescribed with no side effects, tolerating well and no symptoms to report.    Subjective    HPI HPI     Medical Management of Chronic Issues    Additional comments: Patient reports to take all medications as prescribed with no side effects, tolerating well and no symptoms to report.       Last edited by Lilian Fitzpatrick, CMA on 08/04/2023 10:19 AM.       Discussed the use of AI scribe software for clinical note transcription with the patient, who gave verbal consent to proceed.  History of Present Illness   The patient, with a history of cancer and osteopenia, presents for a yearly check-up. She is currently on chemotherapy and reports feeling weak in the legs, especially after chemotherapy sessions. She also reports taking calcium  and vitamin D  supplements due to low calcium  levels. 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 She is concerned about the progression of her osteopenia to osteoporosis and is scheduled for a bone density scan. The patient is up-to-date on vaccinations and screenings.        Medications: Outpatient Medications Prior to Visit  Medication Sig   b complex vitamins capsule Take 1 capsule by mouth daily.   Calcium  Carbonate-Vit D-Min (CALCIUM  1200 PO) Take by mouth.   Cholecalciferol  25 MCG (1000 UT) tablet Take 2,000 Units by mouth daily.   clobetasol  cream (TEMOVATE ) 0.05 %    fluticasone (FLONASE) 50 MCG/ACT nasal spray Place 1 spray into both nostrils daily.   lidocaine -prilocaine  (EMLA ) cream Apply small amount to port and cover with saran wrap 1-2 hours prior to port access   Melatonin 2.5 MG  CHEW Chew by mouth.   Multiple Vitamins-Minerals (MULTIVITAMIN WITH MINERALS) tablet Take 1 tablet by mouth daily.   Pancrelipase , Lip-Prot-Amyl, (CREON ) 24000-76000 units CPEP TAKE 1 CAPSULE THREE TIMES DAILY BEFORE MEALS   senna-docusate (SENOKOT-S) 8.6-50 MG tablet Take by mouth.   simethicone  (GAS-X) 80 MG chewable tablet Chew 1 tablet (80 mg total) by mouth every 8 (eight) hours as needed for flatulence.   [DISCONTINUED] ondansetron  (ZOFRAN ) 8 MG tablet Take 1 tablet (8 mg total) by mouth every 8 (eight) hours as needed for nausea or vomiting. (Patient not taking: Reported on 08/01/2023)   [DISCONTINUED] ondansetron  (ZOFRAN -ODT) 4 MG disintegrating tablet Take 4 mg by mouth every 8 (eight) hours as needed. (Patient not taking: Reported on 08/01/2023)   [DISCONTINUED] polyethylene glycol powder (GLYCOLAX/MIRALAX) 17 GM/SCOOP powder Take by mouth. (Patient not taking: Reported on 08/01/2023)   [DISCONTINUED] potassium chloride  SA (KLOR-CON  M) 20 MEQ tablet Take 1 tablet (20 mEq total) by mouth daily for 5 doses. (Patient not taking: Reported on 06/30/2023)   [DISCONTINUED] prochlorperazine  (COMPAZINE ) 10 MG tablet Take 1 tablet (10 mg total) by mouth every 6 (six) hours as needed for nausea or vomiting. (Patient not taking: Reported on 06/17/2023)   Facility-Administered Medications Prior to Visit  Medication Dose Route Frequency Provider   heparin  lock flush 100 UNIT/ML injection        prochlorperazine  (COMPAZINE ) 10 MG tablet        sodium  chloride flush (NS) 0.9 % injection 10 mL  10 mL Intravenous PRN Babara Call, MD   sodium chloride  flush (NS) 0.9 % injection 10 mL  10 mL Intravenous Once Borders, Joshua R, NP   sodium chloride  flush (NS) 0.9 % injection 10 mL  10 mL Intravenous Once Babara Call, MD    Review of Systems     Objective    BP 128/78 (BP Location: Left Arm, Patient Position: Sitting, Cuff Size: Normal)   Pulse 60   Ht 5' 7 (1.702 m)   Wt 134 lb 9.6 oz (61.1 kg)   SpO2 100%    BMI 21.08 kg/m    Physical Exam Vitals reviewed.  Constitutional:      General: She is not in acute distress.    Appearance: Normal appearance. She is well-developed. She is not diaphoretic.  HENT:     Head: Normocephalic and atraumatic.  Eyes:     General: No scleral icterus.    Conjunctiva/sclera: Conjunctivae normal.  Neck:     Thyroid: No thyromegaly.  Cardiovascular:     Rate and Rhythm: Normal rate and regular rhythm.     Heart sounds: Normal heart sounds. No murmur heard. Pulmonary:     Effort: Pulmonary effort is normal. No respiratory distress.     Breath sounds: Normal breath sounds. No wheezing, rhonchi or rales.  Musculoskeletal:     Cervical back: Neck supple.     Right lower leg: No edema.     Left lower leg: No edema.  Lymphadenopathy:     Cervical: No cervical adenopathy.  Skin:    General: Skin is warm and dry.  Neurological:     Mental Status: She is alert and oriented to person, place, and time. Mental status is at baseline.  Psychiatric:        Mood and Affect: Mood normal.        Behavior: Behavior normal.      No results found for any visits on 08/04/23.  Assessment & Plan     Problem List Items Addressed This Visit       Digestive   Primary pancreatic cancer [facility]) (Chronic)   Pancreatic cancer, post-Whipple surgery (April 2023). Recent CT confirmed lung metastasis. Undergoing bi-weekly chemotherapy, well-tolerated currently. Emphasized regular monitoring and coordination with oncologist. - Continue current chemotherapy regimen per Onc        Musculoskeletal and Integument   Osteopenia after menopause   Osteopenia with potential for progression to osteoporosis, exacerbated by chemotherapy. Currently on 2600 mg calcium  and 3000 IU vitamin D  daily. Discussed bone density monitoring, potential high-dose weekly vitamin D , and osteoporosis treatments, coordinated with oncologist. Highlighted benefits of weightlifting for bone health. - Order  bone density scan - Review vitamin D  levels - Consider high-dose weekly vitamin D  if levels are low - Coordinate with oncologist for potential osteoporosis treatment      Relevant Orders   VITAMIN D  25 Hydroxy (Vit-D Deficiency, Fractures)   DG Bone Density     Other   HLD (hyperlipidemia) - Primary   Reviewed last lipid panel Not currently on a statin Recheck FLP and CMP Discussed diet and exercise       Relevant Orders   Lipid panel   Other Visit Diagnoses       Hyperglycemia       Relevant Orders   Hemoglobin A1c     Avitaminosis D       Relevant Orders   VITAMIN D  25 Hydroxy (Vit-D Deficiency,  Fractures)           Physical Activity and Muscle Weakness Muscle weakness in legs post-chemotherapy. Interested in light weightlifting to improve strength. Recommended starting with bodyweight exercises, gradually increasing intensity. Emphasized quad strengthening and rest on chemotherapy fatigue days. - Encourage light weightlifting and bodyweight exercises - Recommend quad strengthening exercises such as straight leg raises - Advise rest on days of chemotherapy fatigue  General Health Maintenance Up-to-date on shingles, pneumonia, and flu vaccines. Due for mammogram in July and colonoscopy in 2033. Discussed importance of monitoring vitamin D , cholesterol, and A1c levels. - Order annual labs including vitamin D , cholesterol, and A1c - Schedule mammogram for July - Ensure colonoscopy is scheduled for 2033  Follow-up - Follow up with oncologist for chemotherapy and bone density results - Schedule next annual check-up with primary care physician.        Return in about 1 year (around 08/03/2024) for chronic disease f/u.       Jon Eva, MD  [facility] [phone] (phone) [phone] (fax)  [facility]

## 2023-08-04 NOTE — Assessment & Plan Note (Signed)
 Osteopenia with potential for progression to osteoporosis, exacerbated by chemotherapy. Currently on 2600 mg calcium  and 3000 IU vitamin D  daily. Discussed bone density monitoring, potential high-dose weekly vitamin D , and osteoporosis treatments, coordinated with oncologist. Highlighted benefits of weightlifting for bone health. - Order bone density scan - Review vitamin D  levels - Consider high-dose weekly vitamin D  if levels are low - Coordinate with oncologist for potential osteoporosis treatment

## 2023-08-04 NOTE — Assessment & Plan Note (Signed)
 Pancreatic cancer, post-Whipple surgery (April 2023). Recent CT confirmed lung metastasis. Undergoing bi-weekly chemotherapy, well-tolerated currently. Emphasized regular monitoring and coordination with oncologist. - Continue current chemotherapy regimen per Onc

## 2023-08-05 ENCOUNTER — Other Ambulatory Visit: Payer: Self-pay

## 2023-08-08 ENCOUNTER — Ambulatory Visit: Payer: Medicare Other

## 2023-08-08 ENCOUNTER — Ambulatory Visit: Payer: Medicare Other | Admitting: Oncology

## 2023-08-08 ENCOUNTER — Other Ambulatory Visit: Payer: Medicare Other

## 2023-08-15 ENCOUNTER — Inpatient Hospital Stay: Payer: Medicare Other

## 2023-08-15 ENCOUNTER — Inpatient Hospital Stay (HOSPITAL_BASED_OUTPATIENT_CLINIC_OR_DEPARTMENT_OTHER): Payer: Medicare Other | Admitting: Oncology

## 2023-08-15 ENCOUNTER — Encounter: Payer: Self-pay | Admitting: Oncology

## 2023-08-15 DIAGNOSIS — Z5111 Encounter for antineoplastic chemotherapy: Secondary | ICD-10-CM

## 2023-08-15 DIAGNOSIS — C259 Malignant neoplasm of pancreas, unspecified: Secondary | ICD-10-CM

## 2023-08-15 DIAGNOSIS — K8689 Other specified diseases of pancreas: Secondary | ICD-10-CM

## 2023-08-15 DIAGNOSIS — G620 Drug-induced polyneuropathy: Secondary | ICD-10-CM | POA: Diagnosis not present

## 2023-08-15 DIAGNOSIS — T451X5A Adverse effect of antineoplastic and immunosuppressive drugs, initial encounter: Secondary | ICD-10-CM

## 2023-08-15 DIAGNOSIS — E7800 Pure hypercholesterolemia, unspecified: Secondary | ICD-10-CM

## 2023-08-15 LAB — CMP (CANCER CENTER ONLY)
ALT: 28 U/L (ref 0–44)
AST: 31 U/L (ref 15–41)
Albumin: 4 g/dL (ref 3.5–5.0)
Alkaline Phosphatase: 69 U/L (ref 38–126)
Anion gap: 8 (ref 5–15)
BUN: 11 mg/dL (ref 8–23)
CO2: 24 mmol/L (ref 22–32)
Calcium: 8.4 mg/dL — ABNORMAL LOW (ref 8.9–10.3)
Chloride: 106 mmol/L (ref 98–111)
Creatinine: 0.88 mg/dL (ref 0.44–1.00)
GFR, Estimated: >60 mL/min (ref >60)
Glucose, Bld: 125 mg/dL — ABNORMAL HIGH (ref 70–99)
Potassium: 3.9 mmol/L (ref 3.5–5.1)
Sodium: 138 mmol/L (ref 135–145)
Total Bilirubin: 0.8 mg/dL (ref 0.0–1.2)
Total Protein: 6.6 g/dL (ref 6.5–8.1)

## 2023-08-15 LAB — CBC WITH DIFFERENTIAL (CANCER CENTER ONLY)
Abs Immature Granulocytes: 0.01 10*3/uL (ref 0.00–0.07)
Basophils Absolute: 0 10*3/uL (ref 0.0–0.1)
Basophils Relative: 0 %
Eosinophils Absolute: 0.1 10*3/uL (ref 0.0–0.5)
Eosinophils Relative: 2 %
HCT: 34.5 % — ABNORMAL LOW (ref 36.0–46.0)
Hemoglobin: 11.9 g/dL — ABNORMAL LOW (ref 12.0–15.0)
Immature Granulocytes: 0 %
Lymphocytes Relative: 28 %
Lymphs Abs: 1.4 10*3/uL (ref 0.7–4.0)
MCH: 33.7 pg (ref 26.0–34.0)
MCHC: 34.5 g/dL (ref 30.0–36.0)
MCV: 97.7 fL (ref 80.0–100.0)
Monocytes Absolute: 0.5 10*3/uL (ref 0.1–1.0)
Monocytes Relative: 11 %
Neutro Abs: 2.9 10*3/uL (ref 1.7–7.7)
Neutrophils Relative %: 59 %
Platelet Count: 180 10*3/uL (ref 150–400)
RBC: 3.53 MIL/uL — ABNORMAL LOW (ref 3.87–5.11)
RDW: 14 % (ref 11.5–15.5)
WBC Count: 4.9 10*3/uL (ref 4.0–10.5)
nRBC: 0 % (ref 0.0–0.2)

## 2023-08-15 MED ORDER — HEPARIN SOD (PORK) LOCK FLUSH 100 UNIT/ML IV SOLN
500.0000 [IU] | Freq: Once | INTRAVENOUS | Status: DC | PRN
  Filled 2023-08-15: qty 5

## 2023-08-15 MED ORDER — PROCHLORPERAZINE MALEATE 10 MG PO TABS
10.0000 mg | ORAL_TABLET | Freq: Once | ORAL | Status: AC
  Administered 2023-08-15: 10 mg via ORAL
  Filled 2023-08-15: qty 1

## 2023-08-15 MED ORDER — SODIUM CHLORIDE 0.9 % IV SOLN
INTRAVENOUS | Status: DC
Start: 2023-08-15 — End: 2023-08-15
  Filled 2023-08-15: qty 250

## 2023-08-15 MED ORDER — CALCIUM GLUCONATE 10 % IV SOLN
2.0000 g | Freq: Once | INTRAVENOUS | Status: AC
  Administered 2023-08-15: 2 g via INTRAVENOUS
  Filled 2023-08-15: qty 20

## 2023-08-15 MED ORDER — SODIUM CHLORIDE 0.9 % IV SOLN
1000.0000 mg/m2 | Freq: Once | INTRAVENOUS | Status: AC
  Administered 2023-08-15: 1710 mg via INTRAVENOUS
  Filled 2023-08-15: qty 44.97

## 2023-08-15 MED ORDER — PACLITAXEL PROTEIN-BOUND CHEMO INJECTION 100 MG
100.0000 mg/m2 | Freq: Once | INTRAVENOUS | Status: AC
  Administered 2023-08-15: 175 mg via INTRAVENOUS
  Filled 2023-08-15: qty 35

## 2023-08-15 NOTE — Assessment & Plan Note (Signed)
 S/p  Whipple procedure..  Continue Creon, continue [id number] units TID with meals and  with her snacks

## 2023-08-15 NOTE — Assessment & Plan Note (Signed)
 Check lipid panel

## 2023-08-15 NOTE — Patient Instructions (Signed)
 [facility]  Discharge Instructions: Thank you for choosing [facility] to provide your oncology and hematology care.  If you have a lab appointment with the Cancer Center, please go directly to the Cancer Center and check in at the registration area.  Wear comfortable clothing and clothing appropriate for easy access to any Portacath or PICC line.   We strive to give you quality time with your provider. You may need to reschedule your appointment if you arrive late (15 or more minutes).  Arriving late affects you and other patients whose appointments are after yours.  Also, if you miss three or more appointments without notifying the office, you may be dismissed from the clinic at the provider's discretion.      For prescription refill requests, have your pharmacy contact our office and allow 72 hours for refills to be completed.    Today you received the following chemotherapy and/or immunotherapy agents Gemzar & Abraxane      To help prevent nausea and vomiting after your treatment, we encourage you to take your nausea medication as directed.  BELOW ARE SYMPTOMS THAT SHOULD BE REPORTED IMMEDIATELY: *FEVER GREATER THAN 100.4 F (38 C) OR HIGHER *CHILLS OR SWEATING *NAUSEA AND VOMITING THAT IS NOT CONTROLLED WITH YOUR NAUSEA MEDICATION *UNUSUAL SHORTNESS OF BREATH *UNUSUAL BRUISING OR BLEEDING *URINARY PROBLEMS (pain or burning when urinating, or frequent urination) *BOWEL PROBLEMS (unusual diarrhea, constipation, pain near the anus) TENDERNESS IN MOUTH AND THROAT WITH OR WITHOUT PRESENCE OF ULCERS (sore throat, sores in mouth, or a toothache) UNUSUAL RASH, SWELLING OR PAIN  UNUSUAL VAGINAL DISCHARGE OR ITCHING   Items with * indicate a potential emergency and should be followed up as soon as possible or go to the Emergency Department if any problems should occur.  Please show the CHEMOTHERAPY ALERT CARD or  IMMUNOTHERAPY ALERT CARD at check-in to the Emergency Department and triage nurse.  Should you have questions after your visit or need to cancel or reschedule your appointment, please contact [facility] - A DEPT OF [facility]  [phone] and follow the prompts.  Office hours are 8:00 a.m. to 4:30 p.m. Monday - Friday. Please note that voicemails left after 4:00 p.m. may not be returned until the following business day.  We are closed weekends and major holidays. You have access to a nurse at all times for urgent questions. Please call the main number to the clinic [phone] and follow the prompts.  For any non-urgent questions, you may also contact your provider using MyChart. We now offer e-Visits for anyone 33 and older to request care online for non-urgent symptoms. For details visit [email or web address].   Also download the MyChart app! Go to the app store, search "MyChart", open the app, select [place], and log in with your MyChart username and password.

## 2023-08-15 NOTE — Assessment & Plan Note (Signed)
 Chemotherapy plan as listed above

## 2023-08-15 NOTE — Assessment & Plan Note (Addendum)
 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 Continue calcium supplementation, 2000 mg daily-divided in 2-3 doses IV calcium gluconate 2g if calcium <=8.5 Check vitamin D level

## 2023-08-15 NOTE — Assessment & Plan Note (Signed)
 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  Pathology proved liver metastatic disease- additional chemotherapy --> whipple procedure.ypT1b ypN0--> adjuvant Gem/Abraxane finished in August 2023--> Sept 2024 CT showed progression in lung --> Nov 2024 Wedge biopsy- Recurrent pancreatic cancer with lung metastasis.  Currently on Gemcitabine and Abraxane 1 week on 1 week off schedule due to neutropenia.  Labs are reviewed and discussed with patient. Proceed with Gemcitabine and Abraxane.

## 2023-08-15 NOTE — Assessment & Plan Note (Signed)
 fingertips and toes numbness, grade 2.  She did not tolerate Cymbalta.   Stable symptom Acupuncture sessions as needed.Marland Kitchen

## 2023-08-15 NOTE — Progress Notes (Signed)
 Hematology/Oncology Progress note [phone] [phone]) [phone]   CHIEF COMPLAINTS/REASON FOR VISIT:  Follow up for treatment of pancreatic adenocarcinoma  ASSESSMENT & PLAN:   Cancer Staging  Primary pancreatic cancer [facility]) Staging form: Exocrine Pancreas, AJCC 8th Edition - Clinical stage from 02/29/2020: Stage IV (cT2, cN0, cM1) - Signed by [name], MD on 02/29/2020   Hypocalcemia Likely due to malabsorption. 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  Pathology proved liver metastatic disease- additional chemotherapy --> whipple procedure.ypT1b ypN0--> adjuvant Gem/Abraxane finished in August 2023--> Sept 2024 CT showed progression in lung --> Nov 2024 Wedge biopsy- Recurrent pancreatic cancer with lung metastasis.  Currently on Gemcitabine and Abraxane 1 week on 1 week off schedule due to neutropenia.  Labs are reviewed and discussed with patient. Proceed with Gemcitabine and Abraxane.    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  She did not tolerate Cymbalta.   Stable symptom Acupuncture sessions as needed..   Encounter for antineoplastic chemotherapy Chemotherapy plan as listed above  Pancreatic insufficiency S/p  Whipple procedure..  Continue Creon, continue 45409 units TID with meals and  with her snacks   Pure hypercholesterolemia Check lipid panel.      Orders Placed This Encounter  Procedures   Hemoglobin A1c    Standing Status:   Future    Expected Date:   08/29/2023    Expiration Date:   08/28/2024   Lipid panel    Standing Status:   Future    Expected Date:   08/29/2023    Expiration Date:   08/28/2024   Cancer antigen 19-9    Standing  Status:   Future    Expected Date:   09/12/2023    Expiration Date:   09/11/2024   CEA    Standing Status:   Future    Expected Date:   09/12/2023    Expiration Date:   09/11/2024   CBC with Differential (Cancer Center Only)    Standing Status:   Future    Expected Date:   09/12/2023    Expiration Date:   09/11/2024   CMP (Cancer Center only)    Standing Status:   Future    Expected Date:   09/12/2023    Expiration Date:   09/11/2024   CBC with Differential (Cancer Center Only)    Standing Status:   Future    Expected Date:   09/26/2023    Expiration Date:   09/25/2024   CMP (Cancer Center only)    Standing Status:   Future    Expected Date:   09/26/2023    Expiration Date:   09/25/2024   Vitamin D 25 hydroxy    Standing Status:   Future    Expected Date:   08/29/2023    Expiration Date:   08/28/2024    Follow up  2 weeks lab MD Gemcitaibine Abraxane    All questions were answered. The patient knows to call the clinic with any problems, questions or concerns.  [name], MD, PhD [facility] Hematology Oncology 08/15/2023   [name], MD    HISTORY OF PRESENTING ILLNESS:   Cassandra [name] is a  68 y.o.  female with presents for follow up of Stage IV pancreatic adenocarcinoma Patient initially presented with jaundice, transaminitis,  bilirubin was 9.9.  CA 19-9 was 1874.  Patient also reports unintentional weight loss. 02/08/2020 MRI abdomen and MRCP with and without contrast was done at [facility] which showed pancreatic head mass measuring up to 3 cm, with marked associated narrowing of the portal vein confluence.  SMA is preserved.  Marked intrahepatic and extrahepatic biliary duct dilatation as well as mild dilatation of the main pancreatic duct. Multiple hepatic masses highly concerning for metastatic disease.  Patient underwent EUS on 02/19/2020, which showed irregular mass identified in the pancreatic head, hypoechoic, measured 42mmx33mm, sonographic evidence concerning for invasion into the  superior mesenteric artery.  There is no sign of significant abnormality in the main pancreatic duct.  Dilatation of common bile duct which measured up to 16 mm.  Region of celiac artery was visualized and showed no signs of significant abnormality.  No lymphadenopathy.  FNA showed adenocarcinoma.  02/19/2020, ERCP, malignant.  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  The stricture was treated with placement of wall flex metal stent.  Patient was seen by [facility] oncology Dr. Olga Millers and was recommended for 3 drug regimen FOLFIRINOX.  Patient prefers to do chemotherapy locally at [facility].  Patient was referred to establish care today. She denies any pain.  Since stent placement, skin jaundice has improved.  Itchiness has also improved. Patient was accompanied by her husband today.  She has a family history of breast cancer in sister and paternal aunt, colon cancer paternal grandmother.  #No reportable targetable mutation on NGS 9/14/2021cycle 1 FOLFIRINOX.  Patient received oxaliplatin and about 50% of Irinotecan on day 1 and had experienced neurologic symptoms.  She went to ER and working diagnosis is TIA, and eventually I think this is due to irinotecan side effects -irinotecan-associated dysarthria, lip/tongue numbness.  Adjustment was made for Irinotecan to be  infused over 180 minutes.  Atropine 0.5 mg once prior to the irinotecan. No recurrent symptoms.   # 03/19/2020-03/21/2020 patient was admitted due to sepsis with strep pneumonia bacteremia.  Patient was treated with IV Rocephin.  TEE was done which showed no vegetation.  No PFO or ASD.  Patient was discharged home and he finished full course of 14 days of IV Rocephin on 04/02/2020 per ID recommendation.  Repeat blood culture was also negative.  08/25/20 cycle 10 FOLFIRINOX 09/08/20- present  Starting cycle 11, FOLFIRI, Oxaliplatin discontinued due to neuropathy   #NGS showed no  reportable targetable mutation #Genetic testing-[employer] diagnostic testing showed no pathological variants identified. MS stable, TMB 57mut/mb, KRAS G12D, SF3B1 K700E, TP53 V244fs*30  #01/14/2021  CT done at [facility] during the interval, stable disease.  # Her case was presented by Dr.Jia from [facility] tumor board. Clayborne Artist were 4~5 liver lesions on OSH MRI last year at the time of diagnosis highly suspicious for metastasis. She is not eligible for surgical protocol for metastasis resection given the number of liver metastasis is >3. However given her excellent and durable response to chemo, surgical resection may be considered pending sustained disease control at 1 year and may require partial hepatectomy first to see if there is viable tumor before proceeding to Whipple resection ]  Patient had a COVID-19 infection in September 2022.  03/23/2021 CT abdomen pelvis No significant change in the ill-defined pancreatic head mass or  associated biliary ductal and pancreatic ductal dilation.  Slight interval enlargement of subcentimeter anterior peripancreatic  lymph node, nonspecific. Attention on follow-up per clinical protocol. Similar enlargement of  the ascending thoracic aorta measuring 4.4 cm  03/23/2021 MRI abdomen w and wo  Increasing ill-defined hypoenhancement of the pancreatic head surrounding the common bile duct stent, in keeping with known pancreatic malignancy. Anterior peripancreatic lymph node is better evaluated on CT.  Unchanged position of a common bile duct stent with similar mild diffuse intrahepatic ductal dilation and pneumobilia. No evidence of metastatic disease in the abdomen or pelvis. Partially visualized cystic left adnexal lesion measuring up to 3.9 cm.   04/10/2021, patient underwent liver resection at [facility], by Dr. [name] Ducking.   Pathology report from [facility] was reviewed. Segments 3 partial hepatectomy, negative for viable tumor. Section 5/6 partial hepatectomy part 1 and 2, microscopic foci  of residual viable adenocarcinoma, morphologically consistent with history of pancreatic primary.  Parenchymal margin is uninvolved.  #Patient resumed on FOLFIRI on 04/28/2021. She got another cycle on 05/12/2021 #05/26/2021, patient did not get additional chemotherapy due to transaminitis.  Shared decision was made to stop FOLFIRI and switch to gemcitabine Abraxane treatments.  05/26/2021, patient developed transaminitis, AST 763, ALT 691, alkaline phosphatase 364.  Bilirubin 0.7 05/26/2021 stat ultrasound abdomen right upper quadrant showed interval development of 3.3 x 1.8 cm complex mass in the right hepatic lobe.  Increased extrahepatic ductal dilatation.  Concerning for CBD obstruction. 05/29/2021, MRI abdomen MRCP with and without contrast showed unchanged pancreatic head soft tissue.  Severe intra and extrahepatic biliary ductal dilatation.  Common bile duct stents remain in position however patency is not established.  No evidence of lymphadenopathy or metastatic disease in the abdomen.  With further communication with radiologist, addendum was added that there is internal fluid signal and no appreciable associated contrast enhancement measuring 3.4 x 2.3 cm.  This appearance is generally not consistent with metastasis and is of uncertain significance.  Possibly reflecting hepatic abscess or residual of subcapsular hematoma.  Patient was seen by [facility] Dr. Elon Jester team.  He had ERCP done on 06/05/2021, with findings of One stent from the biliary tree was seen in the major papilla. The stent had migrated significantly into the duodenum. This is the cause of stent malfunction. One stent was removed from the biliary tree. Prior biliary sphincterotomy appeared open. - A single severe biliary stricture was found in the lower third of the main bile duct with upstream dilation. The stricture was alignant appearing.- The biliary tree was swept and sludge was found.- One fully covered metal stent was placed into  the common bile duct across the stricture.- No pancreatogram performed.  06/12/2021-08/10/2021 gemcitabine and Abraxane.   10/14/2021, patient underwent Whipple procedure. Liver biopsy negative for malignancy. Review procedure showed invasive adenocarcinoma, moderately differentiated, centered in pancreatic head and confined in the Pancreas. Surgical margin is negative for malignancy.  36 lymph nodes were all negative for malignancy.  1 hepatic artery lymph node was harvested and was negative.  Gallbladder negative for malignancy.  Cystic duct excision negative for malignancy. pT1b pN0  10/25/2021, CT abdomen pelvis with contrast showed rim-enhancing fluid collection in the region of the hepatic hilum. Patient had a readmission for superficial wound infection.  She completed antibiotics on 11/03/2021.  11/03/2021, CT abdomen pelvis with contrast showed near resolution of previously seen fluid collection in the region of the hepatic hilum.  Similar appearance of the area of hypoenhancement in the liver.  Attention on follow-up.  11/17/2021, resumed on gemcitabine/Abraxane. 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  12/12/ 2023 CT scan at [facility] shows no evidence of cancer recurrence.-NED 09/21/2022 CT chest  abdomen pelvis w contrast at [facility] showed-No evidence of metastatic disease in the chest, abdomen, or pelvis. 12/21/2022 CT chest abdomen pelvis w contrast at [facility] showed-No evidence of metastatic disease in the chest, abdomen, or pelvis.   03/22/2023 CT scan at [facility] showed enlarging lung nodules. RLL 1cm, LLL 0.5cm, RUL 0.5cm Stable hypoattenuating right inferior hepatic lobe lesion measuring 0.9 cm.  Similar left adnexal cystic lesion measuring up to 5.0 cm.   04/06/2023 s/p lung nodule biopsy.  Rare atypical epithelioid cells present with evidence suggestive of invasion, suspicious but not diagnostic for malignancy.   05/20/2023: Right robot assisted thoracoscopic wedge resection with MLND  (Dr. Rhona Raider)   Pathology:  Suspicious for malignancy but not diagnostic A. Lymph node, level 8, biopsy: Metastatic carcinoma involving one lymph node (1/1).  B. Lung, right lower lobe, wedge resection: Lung tissue positive for adenocarcinoma, consistent with metastasis from pancreas primary. See comment. Tumor size: 1.0 cm. Operative margin: Negative  Comment: Note is made of the patient's history of invasive adenocarcinoma of the pancreatic head ([id number]; 10/14/2021), which is compared to the current case. Comparison is challenging given the post-treatment nature of the pancreatic tumor; however, there are some morphologic similarities.  Although often unhelpful in the setting of lung primary versus pancreatic primary, immunohistochemical stains were performed. Tumor cells demonstrate the below immunoprofile which would could support pulmonary adenocarcinoma with enteric differentiation but would also be consistent with metastasis from the patient's known pancreatic primary; the latter is favored.              Positive                             Negative CK7 (strong, diffuse) TTF-1  CK20 (strong, focal)                    Positive and negative controls perform appropriately.    C. Lymph node, level 7, biopsy: Metastatic carcinoma involving one lymph node (1/1).  D. Lymph node, level 4R, biopsy: One lymph node, negative for malignancy (0/1).   INTERVAL HISTORY Cassandra [name] is a 68 y.o. female who has above history reviewed by me today presents for stage IV pancreatic cancer.   She tolerates Gemcitabine Abraxane 1 week on and 1 week off   She feels tired after chemotherapy. + chronic neuropathy /numbness on her toes, constant.   She takes calcium supplementation as instructed. Leg cramps She take Creon for pancreatic insufficiency.    Review of Systems  Constitutional:  Positive for fatigue. Negative for appetite change, chills, fever and unexpected weight change.  HENT:   Negative  for hearing loss and voice change.   Eyes:  Negative for eye problems.  Respiratory:  Negative for chest tightness and cough.   Cardiovascular:  Negative for chest pain.  Gastrointestinal:  Negative for abdominal distention, abdominal pain, blood in stool and nausea.  Endocrine: Negative for hot flashes.  Genitourinary:  Negative for difficulty urinating and frequency.   Musculoskeletal:  Negative for arthralgias.       Leg cramps  Skin:  Negative for itching and rash.  Neurological:  Positive for numbness. Negative for extremity weakness.  Hematological:  Negative for adenopathy.  Psychiatric/Behavioral:  Negative for confusion.     MEDICAL HISTORY:  Past Medical History:  Diagnosis Date   Allergy    Anemia    Bacteremia due to Streptococcus pneumoniae 03/19/2020   Cancer (HCC)  pancreatic cancer   Colon polyps    Family history of breast cancer    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   Neutropenia (HCC) 04/18/2020   Osteopenia after menopause 05/2017   femoral neck T score -2.0   Personal history of chemotherapy    current for pancreatic ca   PONV (postoperative nausea and vomiting)    Pure hypercholesterolemia    Sepsis (HCC) 03/19/2020    SURGICAL HISTORY: Past Surgical History:  Procedure Laterality Date   COLONOSCOPY  07/2015   WNL   COLONOSCOPY  2008/2011   COLONOSCOPY WITH PROPOFOL N/A 07/10/2021   Procedure: COLONOSCOPY WITH PROPOFOL;  Surgeon: Regis Bill, MD;  Location: [facility] ENDOSCOPY;  Service: Endoscopy;  Laterality: N/A;   IR CV LINE INJECTION  04/28/2021   OVARIAN CYST REMOVAL  1992   dermoid-Dr CAK   PORTA CATH INSERTION N/A 03/07/2020   Procedure: PORTA CATH INSERTION;  Surgeon: Annice Needy, MD;  Location: [facility] INVASIVE CV LAB;  Service: Cardiovascular;  Laterality: N/A;   TEE WITHOUT CARDIOVERSION N/A 03/21/2020   Procedure: TRANSESOPHAGEAL ECHOCARDIOGRAM (TEE);  Surgeon: Laurier Nancy, MD;  Location: [facility] ORS;  Service:  Cardiovascular;  Laterality: N/A;   TUBAL LIGATION  1993    SOCIAL HISTORY: Social History   Socioeconomic History   Marital status: Married    Spouse name: Not on file   Number of children: 2   Years of education: Not on file   Highest education level: Not on file  Occupational History   Occupation: Teacher    Comment: retired   Occupation: Visual merchandiser  Tobacco Use   Smoking status: Former    Current packs/day: 0.00    Average packs/day: 1 pack/day for 10.0 years (10.0 ttl pk-yrs)    Types: Cigarettes    Start date: 06/29/1975    Quit date: 06/28/1985    Years since quitting: 38.1   Smokeless tobacco: Never   Tobacco comments:    Quit smoking 1987  Vaping Use   Vaping status: Never Used  Substance and Sexual Activity   Alcohol use: Yes    Alcohol/week: 5.0 standard drinks of alcohol    Types: 5 Glasses of wine per week    Comment: 0-2 mixed drinks a day   Drug use: No   Sexual activity: Not Currently    Birth control/protection: Post-menopausal  Other Topics Concern   Not on file  Social History Narrative   Not on file   Social Drivers of Health   Financial Resource Strain: Low Risk  (05/20/2023)   Received from [facility]   Overall Financial Resource Strain (CARDIA)    Difficulty of Paying Living Expenses: Not hard at all  Food Insecurity: No Food Insecurity (05/24/2023)   Hunger Vital Sign    Worried About Running Out of Food in the Last Year: Never true    Ran Out of Food in the Last Year: Never true  Transportation Needs: No Transportation Needs (05/24/2023)   PRAPARE - Administrator, Civil Service (Medical): No    Lack of Transportation (Non-Medical): No  Physical Activity: Insufficiently Active (10/09/2022)   Exercise Vital Sign    Days of Exercise per Week: 4 days    Minutes of Exercise per Session: 30 min  Stress: No Stress Concern Present (10/09/2022)   [employer] of Occupational Health - Occupational Stress  Questionnaire    Feeling of Stress : Not at all  Social Connections: Unknown (  10/09/2022)   Social Connection and Isolation Panel [NHANES]    Frequency of Communication with Friends and Family: More than three times a week    Frequency of Social Gatherings with Friends and Family: More than three times a week    Attends Religious Services: Not on Marketing executive or Organizations: No    Attends Banker Meetings: Never    Marital Status: Married  Catering manager Violence: Not At Risk (05/24/2023)   Humiliation, Afraid, Rape, and Kick questionnaire    Fear of Current or Ex-Partner: No    Emotionally Abused: No    Physically Abused: No    Sexually Abused: No    FAMILY HISTORY: Family History  Problem Relation Age of Onset   Breast cancer Paternal Aunt 76   Diabetes Mother    Osteoporosis Mother    Hyperlipidemia Father    Rheumatic fever Father    Valvular heart disease Father    Cancer Paternal Grandmother        possible colon   Breast cancer Sister 70    ALLERGIES:  is allergic to penicillin g.  MEDICATIONS:  Current Outpatient Medications  Medication Sig Dispense Refill   b complex vitamins capsule Take 1 capsule by mouth daily.     Calcium Carbonate-Vit D-Min (CALCIUM 1200 PO) Take by mouth.     Cholecalciferol 25 MCG (1000 UT) tablet Take 2,000 Units by mouth daily.     clobetasol cream (TEMOVATE) 0.05 %      fluticasone (FLONASE) 50 MCG/ACT nasal spray Place 1 spray into both nostrils daily.     lidocaine-prilocaine (EMLA) cream Apply small amount to port and cover with saran wrap 1-2 hours prior to port access 30 g 6   Melatonin 2.5 MG CHEW Chew by mouth.     Multiple Vitamins-Minerals (MULTIVITAMIN WITH MINERALS) tablet Take 1 tablet by mouth daily.     Pancrelipase, Lip-Prot-Amyl, (CREON) 24000-76000 units CPEP TAKE 1 CAPSULE THREE TIMES DAILY BEFORE MEALS 200 capsule 3   senna-docusate (SENOKOT-S) 8.6-50 MG tablet Take by mouth.      simethicone (GAS-X) 80 MG chewable tablet Chew 1 tablet (80 mg total) by mouth every 8 (eight) hours as needed for flatulence. 60 tablet 0   No current facility-administered medications for this visit.   Facility-Administered Medications Ordered in Other Visits  Medication Dose Route Frequency Provider Last Rate Last Admin   0.9 %  sodium chloride infusion   Intravenous Continuous [name], MD 10 mL/hr at 08/15/23 0943 New Bag at 08/15/23 0943   heparin lock flush 100 UNIT/ML injection            heparin lock flush 100 unit/mL  500 Units Intracatheter Once PRN [name], MD       prochlorperazine (COMPAZINE) 10 MG tablet            sodium chloride flush (NS) 0.9 % injection 10 mL  10 mL Intravenous PRN [name], MD   10 mL at 02/18/21 0858   sodium chloride flush (NS) 0.9 % injection 10 mL  10 mL Intravenous Once Borders, Ivin Booty R, NP       sodium chloride flush (NS) 0.9 % injection 10 mL  10 mL Intravenous Once [name], MD         PHYSICAL EXAMINATION: ECOG PERFORMANCE STATUS: 0 - Asymptomatic Vitals:   08/15/23 0904  BP: 126/77  Pulse: (!) 58  Resp: 18  Temp: 97.9 F (36.6 C)  SpO2: 100%   Filed Weights   08/15/23 0904  Weight: 134 lb 4.8 oz (60.9 kg)    Physical Exam Constitutional:      General: She is not in acute distress. HENT:     Head: Normocephalic and atraumatic.  Eyes:     General: No scleral icterus. Cardiovascular:     Rate and Rhythm: Normal rate and regular rhythm.     Heart sounds: Normal heart sounds.  Pulmonary:     Effort: Pulmonary effort is normal. No respiratory distress.  Abdominal:     General: Bowel sounds are normal. There is no distension.     Palpations: Abdomen is soft.  Musculoskeletal:        General: No deformity. Normal range of motion.     Cervical back: Normal range of motion and neck supple.  Skin:    General: Skin is warm.     Coloration: Skin is not jaundiced.     Findings: No erythema.  Neurological:     Mental Status: She  is alert and oriented to person, place, and time. Mental status is at baseline.  Psychiatric:        Mood and Affect: Mood normal.     LABORATORY DATA:  I have reviewed the data as listed    Latest Ref Rng & Units 08/15/2023    8:46 AM 08/01/2023    8:08 AM 07/18/2023    8:21 AM  CBC  WBC 4.0 - 10.5 K/uL 4.9  4.4  5.5   Hemoglobin 12.0 - 15.0 g/dL 16.1  09.6  04.5   Hematocrit 36.0 - 46.0 % 34.5  34.1  34.0   Platelets 150 - 400 K/uL 180  179  267       Latest Ref Rng & Units 08/15/2023    8:46 AM 08/01/2023    8:08 AM 07/18/2023    8:21 AM  CMP  Glucose 70 - 99 mg/dL 409  811  914   BUN 8 - 23 mg/dL 11  11  12    Creatinine 0.44 - 1.00 mg/dL 7.82  9.56  2.13   Sodium 135 - 145 mmol/L 138  138  140   Potassium 3.5 - 5.1 mmol/L 3.9  3.8  3.8   Chloride 98 - 111 mmol/L 106  109  107   CO2 22 - 32 mmol/L 24  23  24    Calcium 8.9 - 10.3 mg/dL 8.4  8.5  8.4   Total Protein 6.5 - 8.1 g/dL 6.6  6.5  6.3   Total Bilirubin 0.0 - 1.2 mg/dL 0.8  0.7  0.6   Alkaline Phos 38 - 126 U/L 69  65  73   AST 15 - 41 U/L 31  26  24    ALT 0 - 44 U/L 28  21  20       RADIOGRAPHIC STUDIES: I have personally reviewed the radiological images as listed and agreed with the findings in the report. Reviewed findings of MRI abdomen MRCP done at [facility]. CT CHEST ABDOMEN PELVIS W CONTRAST Result Date: 07/04/2023 CLINICAL DATA:  Pancreatic cancer. Staging. On chemotherapy. * Tracking Code: BO * EXAM: CT CHEST, ABDOMEN, AND PELVIS WITH CONTRAST TECHNIQUE: Multidetector CT imaging of the chest, abdomen and pelvis was performed following the standard protocol during bolus administration of intravenous contrast. RADIATION DOSE REDUCTION: This exam was performed according to the departmental dose-optimization program which includes automated exposure control, adjustment of the mA and/or kV according to patient size and/or use of  iterative reconstruction technique. CONTRAST:  80mL OMNIPAQUE IOHEXOL 300 MG/ML  SOLN  COMPARISON:  Outside CT 03/22/2023 and older. Most recent outside CT scan is without report. Clinic note reviewed in epic. FINDINGS: CT CHEST FINDINGS Cardiovascular: Heart is nonenlarged. No significant pericardial effusion. Ectasia of the ascending aorta is noted. This measures up to 4.4 cm at the level of the main pulmonary artery today. Previously 4.4 cm as well. Minimal atherosclerotic calcified plaque along the thoracic aorta particularly the arch and descending. Mild plaque along the origin of the great vessels. Right IJ chest port in place with the tip seen as far as the central SVC above the right atrium. Mediastinum/Nodes: Small thyroid gland. Grossly normal course and caliber of the thoracic esophagus. No specific abnormal lymph node enlargement identified in the thorax including supraclavicular, chest wall, internal mammary chain, axillary regions, hilum or mediastinum. Lungs/Pleura: There is a interval surgical changes along the right lower lobe. The previous nodule in the right lower lobe which measured 12 mm and with subpleural is no longer seen. There is some bandlike opacity in this location with pleural thickening, possibly posttreatment related. There are some other nodules however identified. Example includes left lower lobe measuring 5 mm on series 4, image 108 today. On the prior this measured 3 mm. Multiple juxtapleural nodules left lower lobe more superiorly such as series 4, image 73 measuring 10 mm today and previously 6 mm. Posterolateral left upper lobe lesion measuring 9 mm on series 4, image 38 today and previously 5 mm. Anterior right midlung lesion measuring 5 mm today on series 4, image 66, inferior right upper lobe and previously 5 mm. No pleural effusion, consolidation pneumothorax otherwise. Musculoskeletal: Mild degenerative changes. CT ABDOMEN PELVIS FINDINGS Hepatobiliary: Subtle low-attenuation lesion along the inferior aspect of the right hepatic lobe which previously  measured 9 mm, today is essentially stable on series 2, image 68 of the axial data set. Coronal image 30 of series 5. No new space-occupying liver lesion clearly seen today. Patent portal vein. Gallbladder is absent. Pancreas: Surgical changes from Whipple procedure. 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 No recurrent mass lesion along the surgical margin. Spleen: Tiny low-attenuation splenic lesion on series 2, image 61 is too small to completely characterize but unchanged. Likely a benign cystic lesion. Adrenals/Urinary Tract: Adrenal glands are unremarkable. Kidneys are normal, without renal calculi, focal lesion, or hydronephrosis. Bladder is unremarkable. 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 On this non oral contrast exam the residual stomach, small bowel and large bowel has a normal course and caliber. There is scattered colonic stool identified. Normal retrocecal appendix. Vascular/Lymphatic: Normal caliber aorta and IVC with scattered vascular calcifications. There is some abnormal soft tissue identified in the central mesentery just to the right of the SMV on axial series 2, image 72 measuring 13 by 11 mm. Please see coronal image 27 of series 5. Slight stranding in the mesentery. This structure is just caudal to the surgical margin. Reproductive: Uterus is present. Left adnexal cystic structures again identified, likely ovarian. Today measuring 5.0 by 3.4 cm and previously 5.4 by 3.8 cm. Other: No free intra-air.  No ascites. Musculoskeletal: Mild degenerative changes along the spine and pelvis. Particularly at L5-S1 of the spine. IMPRESSION: Surgical changes from prior Whipple procedure. Developing abnormal soft tissue in the central mesentery just to the right of the SMV and caudal to the surgical bed worrisome for an area  of worsening disease. Multiple small lung nodules  identified. Several are slightly larger today as well. Interval surgical changes along the right lower lobe with parenchymal lung opacity and pleural thickening. Stable right hepatic lobe liver lesion. No new liver disease identified. Stable left adnexal, likely ovarian cystic lesion. 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 Recommend annual imaging followup by CTA or MRA. This recommendation follows 2010 ACCF/AHA/AATS/ACR/ASA/SCA/SCAI/SIR/STS/SVM Guidelines for the Diagnosis and Management of Patients with Thoracic Aortic Disease. Circulation. 2010; 121: J811-B147. Aortic aneurysm NOS (ICD10-I71.9) Findings will be called to the ordering service by the Radiology physician assistant team. Electronically Signed   By: Karen Kays M.D.   On: 07/04/2023 16:14

## 2023-08-16 LAB — CANCER ANTIGEN 19-9: CA 19-9: 6 U/mL (ref 0–35)

## 2023-08-16 LAB — CEA: CEA: 4.1 ng/mL (ref 0.0–4.7)

## 2023-08-22 ENCOUNTER — Ambulatory Visit: Payer: Medicare Other | Admitting: Oncology

## 2023-08-22 ENCOUNTER — Other Ambulatory Visit: Payer: Medicare Other

## 2023-08-22 ENCOUNTER — Ambulatory Visit: Payer: Medicare Other

## 2023-08-24 LAB — HM DEXA SCAN

## 2023-08-29 ENCOUNTER — Inpatient Hospital Stay: Payer: Medicare Other

## 2023-08-29 ENCOUNTER — Inpatient Hospital Stay: Payer: Medicare Other | Attending: Oncology

## 2023-08-29 ENCOUNTER — Encounter: Payer: Self-pay | Admitting: Oncology

## 2023-08-29 ENCOUNTER — Inpatient Hospital Stay (HOSPITAL_BASED_OUTPATIENT_CLINIC_OR_DEPARTMENT_OTHER): Payer: Medicare Other | Admitting: Oncology

## 2023-08-29 VITALS — BP 118/79 | HR 57 | Temp 98.7°F | Wt 134.0 lb

## 2023-08-29 VITALS — BP 143/83 | HR 49

## 2023-08-29 DIAGNOSIS — R5383 Other fatigue: Secondary | ICD-10-CM | POA: Diagnosis not present

## 2023-08-29 DIAGNOSIS — C7800 Secondary malignant neoplasm of unspecified lung: Secondary | ICD-10-CM | POA: Insufficient documentation

## 2023-08-29 DIAGNOSIS — Z87891 Personal history of nicotine dependence: Secondary | ICD-10-CM | POA: Insufficient documentation

## 2023-08-29 DIAGNOSIS — Z79899 Other long term (current) drug therapy: Secondary | ICD-10-CM | POA: Insufficient documentation

## 2023-08-29 DIAGNOSIS — M8580 Other specified disorders of bone density and structure, unspecified site: Secondary | ICD-10-CM | POA: Diagnosis not present

## 2023-08-29 DIAGNOSIS — C250 Malignant neoplasm of head of pancreas: Secondary | ICD-10-CM | POA: Insufficient documentation

## 2023-08-29 DIAGNOSIS — Z5111 Encounter for antineoplastic chemotherapy: Secondary | ICD-10-CM | POA: Insufficient documentation

## 2023-08-29 DIAGNOSIS — G620 Drug-induced polyneuropathy: Secondary | ICD-10-CM

## 2023-08-29 DIAGNOSIS — T451X5A Adverse effect of antineoplastic and immunosuppressive drugs, initial encounter: Secondary | ICD-10-CM

## 2023-08-29 DIAGNOSIS — K8689 Other specified diseases of pancreas: Secondary | ICD-10-CM

## 2023-08-29 DIAGNOSIS — C259 Malignant neoplasm of pancreas, unspecified: Secondary | ICD-10-CM

## 2023-08-29 DIAGNOSIS — C787 Secondary malignant neoplasm of liver and intrahepatic bile duct: Secondary | ICD-10-CM | POA: Insufficient documentation

## 2023-08-29 LAB — CBC WITH DIFFERENTIAL (CANCER CENTER ONLY)
Abs Immature Granulocytes: 0.01 10*3/uL (ref 0.00–0.07)
Basophils Absolute: 0 10*3/uL (ref 0.0–0.1)
Basophils Relative: 1 %
Eosinophils Absolute: 0.1 10*3/uL (ref 0.0–0.5)
Eosinophils Relative: 2 %
HCT: 34.7 % — ABNORMAL LOW (ref 36.0–46.0)
Hemoglobin: 11.8 g/dL — ABNORMAL LOW (ref 12.0–15.0)
Immature Granulocytes: 0 %
Lymphocytes Relative: 24 %
Lymphs Abs: 1.2 10*3/uL (ref 0.7–4.0)
MCH: 33.7 pg (ref 26.0–34.0)
MCHC: 34 g/dL (ref 30.0–36.0)
MCV: 99.1 fL (ref 80.0–100.0)
Monocytes Absolute: 0.6 10*3/uL (ref 0.1–1.0)
Monocytes Relative: 13 %
Neutro Abs: 2.9 10*3/uL (ref 1.7–7.7)
Neutrophils Relative %: 60 %
Platelet Count: 163 10*3/uL (ref 150–400)
RBC: 3.5 MIL/uL — ABNORMAL LOW (ref 3.87–5.11)
RDW: 13.6 % (ref 11.5–15.5)
WBC Count: 4.8 10*3/uL (ref 4.0–10.5)
nRBC: 0 % (ref 0.0–0.2)

## 2023-08-29 LAB — CMP (CANCER CENTER ONLY)
ALT: 22 U/L (ref 0–44)
AST: 25 U/L (ref 15–41)
Albumin: 4 g/dL (ref 3.5–5.0)
Alkaline Phosphatase: 74 U/L (ref 38–126)
Anion gap: 9 (ref 5–15)
BUN: 11 mg/dL (ref 8–23)
CO2: 24 mmol/L (ref 22–32)
Calcium: 8.7 mg/dL — ABNORMAL LOW (ref 8.9–10.3)
Chloride: 106 mmol/L (ref 98–111)
Creatinine: 0.65 mg/dL (ref 0.44–1.00)
GFR, Estimated: >60 mL/min (ref >60)
Glucose, Bld: 108 mg/dL — ABNORMAL HIGH (ref 70–99)
Potassium: 3.8 mmol/L (ref 3.5–5.1)
Sodium: 139 mmol/L (ref 135–145)
Total Bilirubin: 0.9 mg/dL (ref 0.0–1.2)
Total Protein: 6.4 g/dL — ABNORMAL LOW (ref 6.5–8.1)

## 2023-08-29 LAB — LIPID PANEL
Cholesterol: 163 mg/dL (ref 0–200)
HDL: 64 mg/dL (ref >40)
LDL Cholesterol: 78 mg/dL (ref 0–99)
Total CHOL/HDL Ratio: 2.5 ratio
Triglycerides: 103 mg/dL (ref <150)
VLDL: 21 mg/dL (ref 0–40)

## 2023-08-29 LAB — HEMOGLOBIN A1C
Hgb A1c MFr Bld: 5.1 % (ref 4.8–5.6)
Mean Plasma Glucose: 99.67 mg/dL

## 2023-08-29 LAB — VITAMIN D 25 HYDROXY (VIT D DEFICIENCY, FRACTURES): Vit D, 25-Hydroxy: 40.8 ng/mL (ref 30–100)

## 2023-08-29 MED ORDER — PACLITAXEL PROTEIN-BOUND CHEMO INJECTION 100 MG
100.0000 mg/m2 | Freq: Once | INTRAVENOUS | Status: AC
  Administered 2023-08-29: 175 mg via INTRAVENOUS
  Filled 2023-08-29: qty 35

## 2023-08-29 MED ORDER — HEPARIN SOD (PORK) LOCK FLUSH 100 UNIT/ML IV SOLN
500.0000 [IU] | Freq: Once | INTRAVENOUS | Status: AC | PRN
Start: 2023-08-29 — End: 2023-08-29
  Administered 2023-08-29: 500 [IU]
  Filled 2023-08-29: qty 5

## 2023-08-29 MED ORDER — PROCHLORPERAZINE MALEATE 10 MG PO TABS
10.0000 mg | ORAL_TABLET | Freq: Once | ORAL | Status: AC
  Administered 2023-08-29: 10 mg via ORAL
  Filled 2023-08-29: qty 1

## 2023-08-29 MED ORDER — SODIUM CHLORIDE 0.9 % IV SOLN
INTRAVENOUS | Status: DC
  Filled 2023-08-29: qty 250

## 2023-08-29 MED ORDER — SODIUM CHLORIDE 0.9 % IV SOLN
1000.0000 mg/m2 | Freq: Once | INTRAVENOUS | Status: AC
  Administered 2023-08-29: 1710 mg via INTRAVENOUS
  Filled 2023-08-29: qty 44.97

## 2023-08-29 NOTE — Assessment & Plan Note (Signed)
 fingertips and toes numbness, grade 2.  She did not tolerate Cymbalta.   Stable symptom Acupuncture sessions as needed.Marland Kitchen

## 2023-08-29 NOTE — Progress Notes (Signed)
 Pt here for follow up. Pt would like to review bone density results with Dr. Cathie Hoops.

## 2023-08-29 NOTE — Assessment & Plan Note (Signed)
 S/p  Whipple procedure..  Continue Creon, continue [id number] units TID with meals and  with her snacks

## 2023-08-29 NOTE — Assessment & Plan Note (Signed)
 Chemotherapy plan as listed above

## 2023-08-29 NOTE — Assessment & Plan Note (Signed)
 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  Pathology proved liver metastatic disease- additional chemotherapy --> whipple procedure.ypT1b ypN0--> adjuvant Gem/Abraxane finished in August 2023--> Sept 2024 CT showed progression in lung --> Nov 2024 Wedge biopsy- Recurrent pancreatic cancer with lung metastasis.  Currently on Gemcitabine and Abraxane 1 week on 1 week off schedule due to neutropenia.  Labs are reviewed and discussed with patient. She  Proceed with Gemcitabine and Abraxane.

## 2023-08-29 NOTE — Assessment & Plan Note (Signed)
 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 Continue calcium supplementation, 2000 mg daily-divided in 2-3 doses IV calcium gluconate 2g if calcium <=8.5 Check vitamin D level

## 2023-08-29 NOTE — Patient Instructions (Signed)

## 2023-08-29 NOTE — Progress Notes (Signed)
 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  Pathology proved liver metastatic disease- additional chemotherapy --> whipple procedure.ypT1b ypN0--> adjuvant Gem/Abraxane finished in August 2023--> Sept 2024 CT showed progression in lung --> Nov 2024 Wedge biopsy- Recurrent pancreatic cancer with lung metastasis.  Currently on Gemcitabine and Abraxane 1 week on 1 week off schedule due to neutropenia.  Labs are reviewed and discussed with patient. She  Proceed with Gemcitabine and Abraxane.    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  She did not tolerate Cymbalta.   Stable symptom Acupuncture sessions as needed..   Encounter for antineoplastic chemotherapy Chemotherapy plan as listed above  Hypocalcemia Likely due to malabsorption. Continue calcium supplementation, 2000 mg daily-divided in 2-3 doses IV calcium gluconate 2g if calcium <=8.5 Check vitamin D level   Pancreatic insufficiency S/p  Whipple procedure..  Continue Creon, continue 52841 units TID with meals and  with her snacks  No orders of the defined types were placed in this encounter.   Follow up  2 weeks lab MD Gemcitaibine Abraxane    All questions were answered. The patient knows to call the clinic with any problems, questions or concerns.  [name], MD, PhD [facility] Hematology Oncology 08/29/2023   [name], MD    HISTORY OF PRESENTING ILLNESS:    Cassandra [name] is a  68 y.o.  female with presents for follow up of Stage IV pancreatic adenocarcinoma Patient initially presented with jaundice, transaminitis, bilirubin was 9.9.  CA 19-9 was 1874.  Patient also reports unintentional weight loss. 02/08/2020 MRI abdomen and MRCP with and without contrast was done at [facility] which showed pancreatic head mass measuring up to 3 cm, with marked associated narrowing of the portal vein confluence.  SMA is preserved.  Marked intrahepatic and extrahepatic biliary duct dilatation as well as mild dilatation of the main pancreatic duct. Multiple hepatic masses highly concerning for metastatic disease.  Patient underwent EUS on 02/19/2020, which showed irregular mass identified in the pancreatic head, hypoechoic, measured 68mmx33mm, sonographic evidence concerning for invasion into the superior mesenteric artery.  There is no sign of significant abnormality in the main pancreatic duct.  Dilatation of common bile duct which measured up to 16 mm.  Region of celiac artery was visualized and showed no signs of significant abnormality.  No lymphadenopathy.  FNA showed adenocarcinoma.  02/19/2020, ERCP, malignant.  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  The stricture was treated with placement of wall flex metal stent.  Patient was seen by [facility] oncology Dr. Olga Millers and was recommended for 3 drug regimen FOLFIRINOX.  Patient prefers to do chemotherapy locally at [facility].  Patient was referred to establish care today. She denies any pain.  Since stent placement, skin jaundice has improved.  Itchiness has also improved. Patient was accompanied by her husband today.  She has a family history of breast cancer in sister and paternal aunt, colon cancer paternal grandmother.  #No reportable targetable mutation on NGS 9/14/2021cycle 1 FOLFIRINOX.  Patient received oxaliplatin and about 50% of Irinotecan on day 1 and had  experienced neurologic symptoms.  She went to ER and working diagnosis is TIA, and eventually I think this is due to irinotecan side effects -irinotecan-associated dysarthria, lip/tongue numbness.  Adjustment was made for Irinotecan to be  infused over 180 minutes.  Atropine 0.5 mg once prior to the irinotecan. No recurrent symptoms.   # 03/19/2020-03/21/2020 patient was admitted due to sepsis with strep pneumonia bacteremia.  Patient was treated with IV Rocephin.  TEE was done which showed no vegetation.  No PFO or ASD.  Patient was discharged home and he finished full course of 14 days of IV Rocephin on 04/02/2020 per ID recommendation.  Repeat blood culture was also negative.  08/25/20 cycle 10 FOLFIRINOX 09/08/20- present  Starting cycle 11, FOLFIRI, Oxaliplatin discontinued due to neuropathy   #NGS showed no reportable targetable mutation #Genetic testing-[employer] diagnostic testing showed no pathological variants identified. MS stable, TMB 5mut/mb, KRAS G12D, SF3B1 K700E, TP53 V261fs*30  #01/14/2021  CT done at [facility] during the interval, stable disease.  # Her case was presented by Dr.Jia from [facility] tumor board. Clayborne Artist were 4~5 liver lesions on OSH MRI last year at the time of diagnosis highly suspicious for metastasis. She is not eligible for surgical protocol for metastasis resection given the number of liver metastasis is >3. However given her excellent and durable response to chemo, surgical resection may be considered pending sustained disease control at 1 year and may require partial hepatectomy first to see if there is viable tumor before proceeding to Whipple resection ]  Patient had a COVID-19 infection in September 2022.  03/23/2021 CT abdomen pelvis No significant change in the ill-defined pancreatic head mass or  associated biliary ductal and pancreatic ductal dilation.  Slight interval enlargement of subcentimeter anterior peripancreatic  lymph node, nonspecific. Attention on follow-up  per clinical protocol. Similar enlargement of the ascending thoracic aorta measuring 4.4 cm  03/23/2021 MRI abdomen w and wo  Increasing ill-defined hypoenhancement of the pancreatic head surrounding the common bile duct stent, in keeping with known pancreatic malignancy. Anterior peripancreatic lymph node is better evaluated on CT.  Unchanged position of a common bile duct stent with similar mild diffuse intrahepatic ductal dilation and pneumobilia. No evidence of metastatic disease in the abdomen or pelvis. Partially visualized cystic left adnexal lesion measuring up to 3.9 cm.   04/10/2021, patient underwent liver resection at [facility], by Dr. [name] Ducking.   Pathology report from [facility] was reviewed. Segments 3 partial hepatectomy, negative for viable tumor. Section 5/6 partial hepatectomy part 1 and 2, microscopic foci of residual viable adenocarcinoma, morphologically consistent with history of pancreatic primary.  Parenchymal margin is uninvolved.  #Patient resumed on FOLFIRI on 04/28/2021. She got another cycle on 05/12/2021 #05/26/2021, patient did not get additional chemotherapy due to transaminitis.  Shared decision was made to stop FOLFIRI and switch to gemcitabine Abraxane treatments.  05/26/2021, patient developed transaminitis, AST 763, ALT 691, alkaline phosphatase 364.  Bilirubin 0.7 05/26/2021 stat ultrasound abdomen right upper quadrant showed interval development of 3.3 x 1.8 cm complex mass in the right hepatic lobe.  Increased extrahepatic ductal dilatation.  Concerning for CBD obstruction. 05/29/2021, MRI abdomen MRCP with and without contrast showed unchanged pancreatic head soft tissue.  Severe intra and extrahepatic biliary ductal dilatation.  Common bile duct stents remain in position however patency is not established.  No evidence of lymphadenopathy or metastatic disease in the abdomen.  With further communication with radiologist,  addendum was added that there is internal fluid signal and  no appreciable associated contrast enhancement measuring 3.4 x 2.3 cm.  This appearance is generally not consistent with metastasis and is of uncertain significance.  Possibly reflecting hepatic abscess or residual of subcapsular hematoma.  Patient was seen by [facility] Dr. Elon Jester team.  He had ERCP done on 06/05/2021, with findings of One stent from the biliary tree was seen in the major papilla. The stent had migrated significantly into the duodenum. This is the cause of stent malfunction. One stent was removed from the biliary tree. Prior biliary sphincterotomy appeared open. - A single severe biliary stricture was found in the lower third of the main bile duct with upstream dilation. The stricture was alignant appearing.- The biliary tree was swept and sludge was found.- One fully covered metal stent was placed into the common bile duct across the stricture.- No pancreatogram performed.  06/12/2021-08/10/2021 gemcitabine and Abraxane.   10/14/2021, patient underwent Whipple procedure. Liver biopsy negative for malignancy. Review procedure showed invasive adenocarcinoma, moderately differentiated, centered in pancreatic head and confined in the Pancreas. Surgical margin is negative for malignancy.  36 lymph nodes were all negative for malignancy.  1 hepatic artery lymph node was harvested and was negative.  Gallbladder negative for malignancy.  Cystic duct excision negative for malignancy. pT1b pN0  10/25/2021, CT abdomen pelvis with contrast showed rim-enhancing fluid collection in the region of the hepatic hilum. Patient had a readmission for superficial wound infection.  She completed antibiotics on 11/03/2021.  11/03/2021, CT abdomen pelvis with contrast showed near resolution of previously seen fluid collection in the region of the hepatic hilum.  Similar appearance of the area of hypoenhancement in the liver.  Attention on follow-up.  11/17/2021, resumed on gemcitabine/Abraxane. 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  12/12/ 2023 CT scan at [facility] shows no evidence of cancer recurrence.-NED 09/21/2022 CT chest abdomen pelvis w contrast at [facility] showed-No evidence of metastatic disease in the chest, abdomen, or pelvis. 12/21/2022 CT chest abdomen pelvis w contrast at [facility] showed-No evidence of metastatic disease in the chest, abdomen, or pelvis.   03/22/2023 CT scan at [facility] showed enlarging lung nodules. RLL 1cm, LLL 0.5cm, RUL 0.5cm Stable hypoattenuating right inferior hepatic lobe lesion measuring 0.9 cm.  Similar left adnexal cystic lesion measuring up to 5.0 cm.   04/06/2023 s/p lung nodule biopsy.  Rare atypical epithelioid cells present with evidence suggestive of invasion, suspicious but not diagnostic for malignancy.   05/20/2023: Right robot assisted thoracoscopic wedge resection with MLND (Dr. Rhona Raider)   Pathology:  Suspicious for malignancy but not diagnostic A. Lymph node, level 8, biopsy: Metastatic carcinoma involving one lymph node (1/1).  B. Lung, right lower lobe, wedge resection: Lung tissue positive for adenocarcinoma, consistent with metastasis from pancreas primary. See comment. Tumor size: 1.0 cm. Operative margin: Negative  Comment: Note is made of the patient's history of invasive adenocarcinoma of the pancreatic head ([id number]; 10/14/2021), which is compared to the current case. Comparison is challenging given the post-treatment nature of the pancreatic tumor; however, there are some morphologic similarities.  Although often unhelpful in the setting of lung primary versus pancreatic primary, immunohistochemical stains were performed. Tumor cells demonstrate the below immunoprofile which would could support pulmonary adenocarcinoma with enteric differentiation but would also be consistent with metastasis from the patient's known pancreatic primary; the latter is favored.              Positive  Negative CK7 (strong, diffuse) TTF-1  CK20  (strong, focal)                    Positive and negative controls perform appropriately.    C. Lymph node, level 7, biopsy: Metastatic carcinoma involving one lymph node (1/1).  D. Lymph node, level 4R, biopsy: One lymph node, negative for malignancy (0/1).   INTERVAL HISTORY Cassandra [name] is a 68 y.o. female who has above history reviewed by me today presents for stage IV pancreatic cancer.   She tolerates Gemcitabine Abraxane 1 week on and 1 week off   She feels tired after chemotherapy. + chronic neuropathy /numbness on her toes, constant.   She takes calcium supplementation as instructed. Leg cramps She take Creon for pancreatic insufficiency.    Review of Systems  Constitutional:  Positive for fatigue. Negative for appetite change, chills, fever and unexpected weight change.  HENT:   Negative for hearing loss and voice change.   Eyes:  Negative for eye problems.  Respiratory:  Negative for chest tightness and cough.   Cardiovascular:  Negative for chest pain.  Gastrointestinal:  Negative for abdominal distention, abdominal pain, blood in stool and nausea.  Endocrine: Negative for hot flashes.  Genitourinary:  Negative for difficulty urinating and frequency.   Musculoskeletal:  Negative for arthralgias.       Leg cramps  Skin:  Negative for itching and rash.  Neurological:  Positive for numbness. Negative for extremity weakness.  Hematological:  Negative for adenopathy.  Psychiatric/Behavioral:  Negative for confusion.     MEDICAL HISTORY:  Past Medical History:  Diagnosis Date   Allergy    Anemia    Bacteremia due to Streptococcus pneumoniae 03/19/2020   Cancer [facility])    pancreatic cancer   Colon polyps    Family history of breast cancer    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   Neutropenia (HCC) 04/18/2020   Osteopenia after menopause 05/2017   femoral neck T score -2.0   Personal history of chemotherapy    current for pancreatic ca   PONV  (postoperative nausea and vomiting)    Pure hypercholesterolemia    Sepsis (HCC) 03/19/2020    SURGICAL HISTORY: Past Surgical History:  Procedure Laterality Date   COLONOSCOPY  07/2015   WNL   COLONOSCOPY  2008/2011   COLONOSCOPY WITH PROPOFOL N/A 07/10/2021   Procedure: COLONOSCOPY WITH PROPOFOL;  Surgeon: Regis Bill, MD;  Location: [facility] ENDOSCOPY;  Service: Endoscopy;  Laterality: N/A;   IR CV LINE INJECTION  04/28/2021   OVARIAN CYST REMOVAL  1992   dermoid-Dr CAK   PORTA CATH INSERTION N/A 03/07/2020   Procedure: PORTA CATH INSERTION;  Surgeon: Annice Needy, MD;  Location: [facility] INVASIVE CV LAB;  Service: Cardiovascular;  Laterality: N/A;   TEE WITHOUT CARDIOVERSION N/A 03/21/2020   Procedure: TRANSESOPHAGEAL ECHOCARDIOGRAM (TEE);  Surgeon: Laurier Nancy, MD;  Location: [facility] ORS;  Service: Cardiovascular;  Laterality: N/A;   TUBAL LIGATION  1993    SOCIAL HISTORY: Social History   Socioeconomic History   Marital status: Married    Spouse name: Not on file   Number of children: 2   Years of education: Not on file   Highest education level: Not on file  Occupational History   Occupation: Teacher    Comment: retired   Occupation: Visual merchandiser  Tobacco Use   Smoking status: Former    Current packs/day: 0.00    Average packs/day:  1 pack/day for 10.0 years (10.0 ttl pk-yrs)    Types: Cigarettes    Start date: 06/29/1975    Quit date: 06/28/1985    Years since quitting: 38.1   Smokeless tobacco: Never   Tobacco comments:    Quit smoking 1987  Vaping Use   Vaping status: Never Used  Substance and Sexual Activity   Alcohol use: Yes    Alcohol/week: 5.0 standard drinks of alcohol    Types: 5 Glasses of wine per week    Comment: 0-2 mixed drinks a day   Drug use: No   Sexual activity: Not Currently    Birth control/protection: Post-menopausal  Other Topics Concern   Not on file  Social History Narrative   Not on file   Social Drivers of Health   Financial  Resource Strain: Low Risk  (05/20/2023)   Received from [facility]   Overall Financial Resource Strain (CARDIA)    Difficulty of Paying Living Expenses: Not hard at all  Food Insecurity: No Food Insecurity (05/24/2023)   Hunger Vital Sign    Worried About Running Out of Food in the Last Year: Never true    Ran Out of Food in the Last Year: Never true  Transportation Needs: No Transportation Needs (05/24/2023)   PRAPARE - Administrator, Civil Service (Medical): No    Lack of Transportation (Non-Medical): No  Physical Activity: Insufficiently Active (10/09/2022)   Exercise Vital Sign    Days of Exercise per Week: 4 days    Minutes of Exercise per Session: 30 min  Stress: No Stress Concern Present (10/09/2022)   [employer] of Occupational Health - Occupational Stress Questionnaire    Feeling of Stress : Not at all  Social Connections: Unknown (10/09/2022)   Social Connection and Isolation Panel [NHANES]    Frequency of Communication with Friends and Family: More than three times a week    Frequency of Social Gatherings with Friends and Family: More than three times a week    Attends Religious Services: Not on Insurance claims handler of Clubs or Organizations: No    Attends Banker Meetings: Never    Marital Status: Married  Catering manager Violence: Not At Risk (05/24/2023)   Humiliation, Afraid, Rape, and Kick questionnaire    Fear of Current or Ex-Partner: No    Emotionally Abused: No    Physically Abused: No    Sexually Abused: No    FAMILY HISTORY: Family History  Problem Relation Age of Onset   Breast cancer Paternal Aunt 68   Diabetes Mother    Osteoporosis Mother    Hyperlipidemia Father    Rheumatic fever Father    Valvular heart disease Father    Cancer Paternal Grandmother        possible colon   Breast cancer Sister 35    ALLERGIES:  is allergic to penicillin g.  MEDICATIONS:  Current Outpatient Medications   Medication Sig Dispense Refill   b complex vitamins capsule Take 1 capsule by mouth daily.     Calcium Carbonate-Vit D-Min (CALCIUM 1200 PO) Take by mouth.     Cholecalciferol 25 MCG (1000 UT) tablet Take 2,000 Units by mouth daily.     clobetasol cream (TEMOVATE) 0.05 %      fluticasone (FLONASE) 50 MCG/ACT nasal spray Place 1 spray into both nostrils daily.     lidocaine-prilocaine (EMLA) cream Apply small amount to port and cover with saran wrap 1-2 hours prior  to port access 30 g 6   Melatonin 2.5 MG CHEW Chew by mouth.     Multiple Vitamins-Minerals (MULTIVITAMIN WITH MINERALS) tablet Take 1 tablet by mouth daily.     Pancrelipase, Lip-Prot-Amyl, (CREON) 24000-76000 units CPEP TAKE 1 CAPSULE THREE TIMES DAILY BEFORE MEALS 200 capsule 3   senna-docusate (SENOKOT-S) 8.6-50 MG tablet Take by mouth.     simethicone (GAS-X) 80 MG chewable tablet Chew 1 tablet (80 mg total) by mouth every 8 (eight) hours as needed for flatulence. 60 tablet 0   No current facility-administered medications for this visit.   Facility-Administered Medications Ordered in Other Visits  Medication Dose Route Frequency Provider Last Rate Last Admin   heparin lock flush 100 UNIT/ML injection            prochlorperazine (COMPAZINE) 10 MG tablet            sodium chloride flush (NS) 0.9 % injection 10 mL  10 mL Intravenous PRN [name], MD   10 mL at 02/18/21 0858   sodium chloride flush (NS) 0.9 % injection 10 mL  10 mL Intravenous Once Borders, Ivin Booty R, NP       sodium chloride flush (NS) 0.9 % injection 10 mL  10 mL Intravenous Once [name], MD         PHYSICAL EXAMINATION: ECOG PERFORMANCE STATUS: 0 - Asymptomatic Vitals:   08/29/23 0838  BP: 118/79  Pulse: (!) 57  Temp: 98.7 F (37.1 C)  SpO2: 100%   Filed Weights   08/29/23 0838  Weight: 134 lb (60.8 kg)    Physical Exam Constitutional:      General: She is not in acute distress. HENT:     Head: Normocephalic and atraumatic.  Eyes:      General: No scleral icterus. Cardiovascular:     Rate and Rhythm: Normal rate and regular rhythm.     Heart sounds: Normal heart sounds.  Pulmonary:     Effort: Pulmonary effort is normal. No respiratory distress.  Abdominal:     General: Bowel sounds are normal. There is no distension.     Palpations: Abdomen is soft.  Musculoskeletal:        General: No deformity. Normal range of motion.     Cervical back: Normal range of motion and neck supple.  Skin:    General: Skin is warm.     Coloration: Skin is not jaundiced.     Findings: No erythema.  Neurological:     Mental Status: She is alert and oriented to person, place, and time. Mental status is at baseline.  Psychiatric:        Mood and Affect: Mood normal.     LABORATORY DATA:  I have reviewed the data as listed    Latest Ref Rng & Units 08/29/2023    8:05 AM 08/15/2023    8:46 AM 08/01/2023    8:08 AM  CBC  WBC 4.0 - 10.5 K/uL 4.8  4.9  4.4   Hemoglobin 12.0 - 15.0 g/dL 16.1  09.6  04.5   Hematocrit 36.0 - 46.0 % 34.7  34.5  34.1   Platelets 150 - 400 K/uL 163  180  179       Latest Ref Rng & Units 08/29/2023    8:05 AM 08/15/2023    8:46 AM 08/01/2023    8:08 AM  CMP  Glucose 70 - 99 mg/dL 409  811  914   BUN 8 - 23 mg/dL 11  11  11   Creatinine 0.44 - 1.00 mg/dL 5.78  4.69  6.29   Sodium 135 - 145 mmol/L 139  138  138   Potassium 3.5 - 5.1 mmol/L 3.8  3.9  3.8   Chloride 98 - 111 mmol/L 106  106  109   CO2 22 - 32 mmol/L 24  24  23    Calcium 8.9 - 10.3 mg/dL 8.7  8.4  8.5   Total Protein 6.5 - 8.1 g/dL 6.4  6.6  6.5   Total Bilirubin 0.0 - 1.2 mg/dL 0.9  0.8  0.7   Alkaline Phos 38 - 126 U/L 74  69  65   AST 15 - 41 U/L 25  31  26    ALT 0 - 44 U/L 22  28  21       RADIOGRAPHIC STUDIES: I have personally reviewed the radiological images as listed and agreed with the findings in the report. Reviewed findings of MRI abdomen MRCP done at [facility]. CT CHEST ABDOMEN PELVIS W CONTRAST Result Date: 07/04/2023 CLINICAL  DATA:  Pancreatic cancer. Staging. On chemotherapy. * Tracking Code: BO * EXAM: CT CHEST, ABDOMEN, AND PELVIS WITH CONTRAST TECHNIQUE: Multidetector CT imaging of the chest, abdomen and pelvis was performed following the standard protocol during bolus administration of intravenous contrast. RADIATION DOSE REDUCTION: This exam was performed according to the departmental dose-optimization program which includes automated exposure control, adjustment of the mA and/or kV according to patient size and/or use of iterative reconstruction technique. CONTRAST:  80mL OMNIPAQUE IOHEXOL 300 MG/ML  SOLN COMPARISON:  Outside CT 03/22/2023 and older. Most recent outside CT scan is without report. Clinic note reviewed in epic. FINDINGS: CT CHEST FINDINGS Cardiovascular: Heart is nonenlarged. No significant pericardial effusion. Ectasia of the ascending aorta is noted. This measures up to 4.4 cm at the level of the main pulmonary artery today. Previously 4.4 cm as well. Minimal atherosclerotic calcified plaque along the thoracic aorta particularly the arch and descending. Mild plaque along the origin of the great vessels. Right IJ chest port in place with the tip seen as far as the central SVC above the right atrium. Mediastinum/Nodes: Small thyroid gland. Grossly normal course and caliber of the thoracic esophagus. No specific abnormal lymph node enlargement identified in the thorax including supraclavicular, chest wall, internal mammary chain, axillary regions, hilum or mediastinum. Lungs/Pleura: There is a interval surgical changes along the right lower lobe. The previous nodule in the right lower lobe which measured 12 mm and with subpleural is no longer seen. There is some bandlike opacity in this location with pleural thickening, possibly posttreatment related. There are some other nodules however identified. Example includes left lower lobe measuring 5 mm on series 4, image 108 today. On the prior this measured 3 mm. Multiple  juxtapleural nodules left lower lobe more superiorly such as series 4, image 73 measuring 10 mm today and previously 6 mm. Posterolateral left upper lobe lesion measuring 9 mm on series 4, image 38 today and previously 5 mm. Anterior right midlung lesion measuring 5 mm today on series 4, image 66, inferior right upper lobe and previously 5 mm. No pleural effusion, consolidation pneumothorax otherwise. Musculoskeletal: Mild degenerative changes. CT ABDOMEN PELVIS FINDINGS Hepatobiliary: Subtle low-attenuation lesion along the inferior aspect of the right hepatic lobe which previously measured 9 mm, today is essentially stable on series 2, image 68 of the axial data set. Coronal image 30 of series 5. No new space-occupying liver lesion clearly seen today. Patent portal vein. Gallbladder  is absent. Pancreas: Surgical changes from Whipple procedure. 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 No recurrent mass lesion along the surgical margin. Spleen: Tiny low-attenuation splenic lesion on series 2, image 61 is too small to completely characterize but unchanged. Likely a benign cystic lesion. Adrenals/Urinary Tract: Adrenal glands are unremarkable. Kidneys are normal, without renal calculi, focal lesion, or hydronephrosis. Bladder is unremarkable. 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 On this non oral contrast exam the residual stomach, small bowel and large bowel has a normal course and caliber. There is scattered colonic stool identified. Normal retrocecal appendix. Vascular/Lymphatic: Normal caliber aorta and IVC with scattered vascular calcifications. There is some abnormal soft tissue identified in the central mesentery just to the right of the SMV on axial series 2, image 72 measuring 13 by 11 mm. Please see coronal image 27 of series 5. Slight stranding in the mesentery. This structure is  just caudal to the surgical margin. Reproductive: Uterus is present. Left adnexal cystic structures again identified, likely ovarian. Today measuring 5.0 by 3.4 cm and previously 5.4 by 3.8 cm. Other: No free intra-air.  No ascites. Musculoskeletal: Mild degenerative changes along the spine and pelvis. Particularly at L5-S1 of the spine. IMPRESSION: Surgical changes from prior Whipple procedure. Developing abnormal soft tissue in the central mesentery just to the right of the SMV and caudal to the surgical bed worrisome for an area of worsening disease. Multiple small lung nodules identified. Several are slightly larger today as well. Interval surgical changes along the right lower lobe with parenchymal lung opacity and pleural thickening. Stable right hepatic lobe liver lesion. No new liver disease identified. Stable left adnexal, likely ovarian cystic lesion. 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 Recommend annual imaging followup by CTA or MRA. This recommendation follows 2010 ACCF/AHA/AATS/ACR/ASA/SCA/SCAI/SIR/STS/SVM Guidelines for the Diagnosis and Management of Patients with Thoracic Aortic Disease. Circulation. 2010; 121: Y782-N562. Aortic aneurysm NOS (ICD10-I71.9) Findings will be called to the ordering service by the Radiology physician assistant team. Electronically Signed   By: Karen Kays M.D.   On: 07/04/2023 16:14

## 2023-08-30 ENCOUNTER — Encounter: Payer: Self-pay | Admitting: Family Medicine

## 2023-09-12 ENCOUNTER — Encounter: Payer: Self-pay | Admitting: Oncology

## 2023-09-12 ENCOUNTER — Telehealth: Payer: Self-pay

## 2023-09-12 ENCOUNTER — Inpatient Hospital Stay (HOSPITAL_BASED_OUTPATIENT_CLINIC_OR_DEPARTMENT_OTHER): Payer: Medicare Other | Admitting: Oncology

## 2023-09-12 ENCOUNTER — Inpatient Hospital Stay: Payer: Medicare Other

## 2023-09-12 ENCOUNTER — Other Ambulatory Visit: Payer: Self-pay | Admitting: Oncology

## 2023-09-12 VITALS — BP 134/80 | HR 58 | Temp 98.2°F | Resp 18 | Wt 135.8 lb

## 2023-09-12 DIAGNOSIS — C259 Malignant neoplasm of pancreas, unspecified: Secondary | ICD-10-CM

## 2023-09-12 DIAGNOSIS — K8689 Other specified diseases of pancreas: Secondary | ICD-10-CM | POA: Diagnosis not present

## 2023-09-12 DIAGNOSIS — Z5111 Encounter for antineoplastic chemotherapy: Secondary | ICD-10-CM | POA: Diagnosis not present

## 2023-09-12 DIAGNOSIS — G620 Drug-induced polyneuropathy: Secondary | ICD-10-CM

## 2023-09-12 DIAGNOSIS — T451X5A Adverse effect of antineoplastic and immunosuppressive drugs, initial encounter: Secondary | ICD-10-CM

## 2023-09-12 LAB — CBC WITH DIFFERENTIAL (CANCER CENTER ONLY)
Abs Immature Granulocytes: 0.02 10*3/uL (ref 0.00–0.07)
Basophils Absolute: 0 10*3/uL (ref 0.0–0.1)
Basophils Relative: 0 %
Eosinophils Absolute: 0.1 10*3/uL (ref 0.0–0.5)
Eosinophils Relative: 2 %
HCT: 34.6 % — ABNORMAL LOW (ref 36.0–46.0)
Hemoglobin: 11.4 g/dL — ABNORMAL LOW (ref 12.0–15.0)
Immature Granulocytes: 0 %
Lymphocytes Relative: 25 %
Lymphs Abs: 1.2 10*3/uL (ref 0.7–4.0)
MCH: 33.1 pg (ref 26.0–34.0)
MCHC: 32.9 g/dL (ref 30.0–36.0)
MCV: 100.6 fL — ABNORMAL HIGH (ref 80.0–100.0)
Monocytes Absolute: 0.5 10*3/uL (ref 0.1–1.0)
Monocytes Relative: 11 %
Neutro Abs: 3 10*3/uL (ref 1.7–7.7)
Neutrophils Relative %: 62 %
Platelet Count: 163 10*3/uL (ref 150–400)
RBC: 3.44 MIL/uL — ABNORMAL LOW (ref 3.87–5.11)
RDW: 14 % (ref 11.5–15.5)
WBC Count: 4.9 10*3/uL (ref 4.0–10.5)
nRBC: 0 % (ref 0.0–0.2)

## 2023-09-12 LAB — CMP (CANCER CENTER ONLY)
ALT: 27 U/L (ref 0–44)
AST: 29 U/L (ref 15–41)
Albumin: 3.9 g/dL (ref 3.5–5.0)
Alkaline Phosphatase: 64 U/L (ref 38–126)
Anion gap: 6 (ref 5–15)
BUN: 19 mg/dL (ref 8–23)
CO2: 24 mmol/L (ref 22–32)
Calcium: 8.4 mg/dL — ABNORMAL LOW (ref 8.9–10.3)
Chloride: 107 mmol/L (ref 98–111)
Creatinine: 0.78 mg/dL (ref 0.44–1.00)
GFR, Estimated: >60 mL/min (ref >60)
Glucose, Bld: 104 mg/dL — ABNORMAL HIGH (ref 70–99)
Potassium: 4 mmol/L (ref 3.5–5.1)
Sodium: 137 mmol/L (ref 135–145)
Total Bilirubin: 0.4 mg/dL (ref 0.0–1.2)
Total Protein: 6.2 g/dL — ABNORMAL LOW (ref 6.5–8.1)

## 2023-09-12 MED ORDER — SODIUM CHLORIDE 0.9 % IV SOLN
1000.0000 mg/m2 | Freq: Once | INTRAVENOUS | Status: AC
  Administered 2023-09-12: 1710 mg via INTRAVENOUS
  Filled 2023-09-12: qty 44.97

## 2023-09-12 MED ORDER — SODIUM CHLORIDE 0.9 % IV SOLN
INTRAVENOUS | Status: DC
  Filled 2023-09-12 (×2): qty 250

## 2023-09-12 MED ORDER — SODIUM CHLORIDE 0.9 % IV SOLN
2.0000 g | INTRAVENOUS | Status: DC | PRN
  Administered 2023-09-12: 2 g via INTRAVENOUS
  Filled 2023-09-12: qty 20

## 2023-09-12 MED ORDER — PACLITAXEL PROTEIN-BOUND CHEMO INJECTION 100 MG
100.0000 mg/m2 | Freq: Once | INTRAVENOUS | Status: AC
  Administered 2023-09-12: 175 mg via INTRAVENOUS
  Filled 2023-09-12: qty 35

## 2023-09-12 MED ORDER — PROCHLORPERAZINE MALEATE 10 MG PO TABS
10.0000 mg | ORAL_TABLET | Freq: Once | ORAL | Status: AC
  Administered 2023-09-12: 10 mg via ORAL
  Filled 2023-09-12: qty 1

## 2023-09-12 NOTE — Assessment & Plan Note (Signed)
 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  Pathology proved liver metastatic disease- additional chemotherapy --> whipple procedure.ypT1b ypN0--> adjuvant Gem/Abraxane finished in August 2023--> Sept 2024 CT showed progression in lung --> Nov 2024 Wedge biopsy- Recurrent pancreatic cancer with lung metastasis.  Currently on Gemcitabine and Abraxane 1 week on 1 week off schedule due to neutropenia.  Labs are reviewed and discussed with patient. Proceed with Gemcitabine and Abraxane.

## 2023-09-12 NOTE — Assessment & Plan Note (Signed)
 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 Continue calcium supplementation, 2000 mg daily-divided in 2-3 doses IV calcium gluconate 2g if calcium <=8.5 Check vitamin D level

## 2023-09-12 NOTE — Assessment & Plan Note (Signed)
 Chemotherapy plan as listed above

## 2023-09-12 NOTE — Assessment & Plan Note (Signed)
 fingertips and toes numbness, grade 2.  She did not tolerate Cymbalta.   Stable symptom Acupuncture sessions as needed.Marland Kitchen

## 2023-09-12 NOTE — Assessment & Plan Note (Signed)
 S/p  Whipple procedure..  Continue Creon, continue [id number] units TID with meals and  with her snacks

## 2023-09-12 NOTE — Patient Instructions (Signed)
 [facility] - A DEPT OF [facility]  Discharge Instructions: Thank you for choosing [facility] to provide your oncology and hematology care.  If you have a lab appointment with the Cancer Center, please go directly to the Cancer Center and check in at the registration area.  Wear comfortable clothing and clothing appropriate for easy access to any Portacath or PICC line.   We strive to give you quality time with your provider. You may need to reschedule your appointment if you arrive late (15 or more minutes).  Arriving late affects you and other patients whose appointments are after yours.  Also, if you miss three or more appointments without notifying the office, you may be dismissed from the clinic at the provider's discretion.      For prescription refill requests, have your pharmacy contact our office and allow 72 hours for refills to be completed.    Today you received the following chemotherapy and/or immunotherapy agents Abraxane & Gemzar      To help prevent nausea and vomiting after your treatment, we encourage you to take your nausea medication as directed.  BELOW ARE SYMPTOMS THAT SHOULD BE REPORTED IMMEDIATELY: *FEVER GREATER THAN 100.4 F (38 C) OR HIGHER *CHILLS OR SWEATING *NAUSEA AND VOMITING THAT IS NOT CONTROLLED WITH YOUR NAUSEA MEDICATION *UNUSUAL SHORTNESS OF BREATH *UNUSUAL BRUISING OR BLEEDING *URINARY PROBLEMS (pain or burning when urinating, or frequent urination) *BOWEL PROBLEMS (unusual diarrhea, constipation, pain near the anus) TENDERNESS IN MOUTH AND THROAT WITH OR WITHOUT PRESENCE OF ULCERS (sore throat, sores in mouth, or a toothache) UNUSUAL RASH, SWELLING OR PAIN  UNUSUAL VAGINAL DISCHARGE OR ITCHING   Items with * indicate a potential emergency and should be followed up as soon as possible or go to the Emergency Department if any problems should occur.  Please show the CHEMOTHERAPY ALERT CARD or  IMMUNOTHERAPY ALERT CARD at check-in to the Emergency Department and triage nurse.  Should you have questions after your visit or need to cancel or reschedule your appointment, please contact [facility] - A DEPT OF [facility]  [phone] and follow the prompts.  Office hours are 8:00 a.m. to 4:30 p.m. Monday - Friday. Please note that voicemails left after 4:00 p.m. may not be returned until the following business day.  We are closed weekends and major holidays. You have access to a nurse at all times for urgent questions. Please call the main number to the clinic [phone] and follow the prompts.  For any non-urgent questions, you may also contact your provider using MyChart. We now offer e-Visits for anyone 17 and older to request care online for non-urgent symptoms. For details visit [email or web address].   Also download the MyChart app! Go to the app store, search "MyChart", open the app, select [place], and log in with your MyChart username and password.

## 2023-09-12 NOTE — Telephone Encounter (Signed)
 [facility] and (xT & xR) requested on specimen: [id number], collected 05/20/2023 at [facility].  Financial application was submitted and approved. Pt's out of-of-pocket cost will be $0.

## 2023-09-12 NOTE — Progress Notes (Signed)
 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  Pathology proved liver metastatic disease- additional chemotherapy --> whipple procedure.ypT1b ypN0--> adjuvant Gem/Abraxane finished in August 2023--> Sept 2024 CT showed progression in lung --> Nov 2024 Wedge biopsy- Recurrent pancreatic cancer with lung metastasis.  Currently on Gemcitabine and Abraxane 1 week on 1 week off schedule due to neutropenia.  Labs are reviewed and discussed with patient.  Proceed with Gemcitabine and Abraxane.    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  She did not tolerate Cymbalta.   Stable symptom Acupuncture sessions as needed..   Encounter for antineoplastic chemotherapy Chemotherapy plan as listed above  Hypocalcemia Likely due to malabsorption. Continue calcium supplementation, 2000 mg daily-divided in 2-3 doses IV calcium gluconate 2g if calcium <=8.5 Check vitamin D level   Pancreatic insufficiency S/p  Whipple procedure..  Continue Creon, continue 13244 units TID with meals and  with her snacks  Orders Placed This Encounter  Procedures   CT CHEST ABDOMEN PELVIS W CONTRAST    Standing Status:   Future    Expected Date:   10/03/2023    Expiration Date:   09/11/2024    If indicated for the ordered procedure, I authorize the administration of contrast media per Radiology protocol:   Yes    Does the patient have a contrast media/X-ray dye  allergy?:   No    Preferred imaging location?:   [facility]    If indicated for the ordered procedure, I authorize the administration of oral contrast media per Radiology protocol:   Yes    Follow up  2 weeks lab MD Gemcitaibine Abraxane    All questions were answered. The patient knows to call the clinic with any problems, questions or concerns.  [name], MD, PhD [facility] Hematology Oncology 09/12/2023   [name], MD    HISTORY OF PRESENTING ILLNESS:   Cassandra [name] is a  68 y.o.  female with presents for follow up of Stage IV pancreatic adenocarcinoma Patient initially presented with jaundice, transaminitis, bilirubin was 9.9.  CA 19-9 was 1874.  Patient also reports unintentional weight loss. 02/08/2020 MRI abdomen and MRCP with and without contrast was done at [facility] which showed pancreatic head mass measuring up to 3 cm, with marked associated narrowing of the portal vein confluence.  SMA is preserved.  Marked intrahepatic and extrahepatic biliary duct dilatation as well as mild dilatation of the main pancreatic duct. Multiple hepatic masses highly concerning for metastatic disease.  Patient underwent EUS on 02/19/2020, which showed irregular mass identified in the pancreatic head, hypoechoic, measured 83mmx33mm, sonographic evidence concerning for invasion into the superior mesenteric artery.  There is no sign of significant abnormality in the main pancreatic duct.  Dilatation of common bile duct which measured up to 16 mm.  Region of celiac artery was visualized and showed no signs of significant abnormality.  No lymphadenopathy.  FNA showed adenocarcinoma.  02/19/2020, ERCP, malignant.  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  The stricture was treated with placement of wall flex metal stent.  Patient was seen by [facility] oncology Dr. Olga Millers and was recommended for 3 drug regimen FOLFIRINOX.  Patient prefers to do  chemotherapy locally at [facility].  Patient was referred to establish care today. She denies any pain.  Since stent placement, skin jaundice has improved.  Itchiness has also improved. Patient was accompanied by her husband today.  She has a family history of breast cancer in sister and paternal aunt, colon cancer paternal grandmother.  #No reportable targetable mutation on NGS 9/14/2021cycle 1 FOLFIRINOX.  Patient received oxaliplatin and about 50% of Irinotecan on day 1 and had experienced neurologic symptoms.  She went to ER and working diagnosis is TIA, and eventually I think this is due to irinotecan side effects -irinotecan-associated dysarthria, lip/tongue numbness.  Adjustment was made for Irinotecan to be  infused over 180 minutes.  Atropine 0.5 mg once prior to the irinotecan. No recurrent symptoms.   # 03/19/2020-03/21/2020 patient was admitted due to sepsis with strep pneumonia bacteremia.  Patient was treated with IV Rocephin.  TEE was done which showed no vegetation.  No PFO or ASD.  Patient was discharged home and he finished full course of 14 days of IV Rocephin on 04/02/2020 per ID recommendation.  Repeat blood culture was also negative.  08/25/20 cycle 10 FOLFIRINOX 09/08/20- present  Starting cycle 11, FOLFIRI, Oxaliplatin discontinued due to neuropathy   #NGS showed no reportable targetable mutation #Genetic testing-[employer] diagnostic testing showed no pathological variants identified. MS stable, TMB 62mut/mb, KRAS G12D, SF3B1 K700E, TP53 V239fs*30  #01/14/2021  CT done at [facility] during the interval, stable disease.  # Her case was presented by Dr.Jia from [facility] tumor board. Clayborne Artist were 4~5 liver lesions on OSH MRI last year at the time of diagnosis highly suspicious for metastasis. She is not eligible for surgical protocol for metastasis resection given the number of liver metastasis is >3. However given her excellent and durable response to chemo, surgical resection may be considered  pending sustained disease control at 1 year and may require partial hepatectomy first to see if there is viable tumor before proceeding to Whipple resection ]  Patient had a COVID-19 infection in September 2022.  03/23/2021 CT abdomen pelvis No significant change in the ill-defined pancreatic head mass or  associated biliary ductal and pancreatic ductal dilation.  Slight interval enlargement of subcentimeter anterior peripancreatic  lymph node, nonspecific. Attention on follow-up per clinical protocol. Similar enlargement of the ascending thoracic aorta measuring 4.4 cm  03/23/2021 MRI abdomen w and wo  Increasing ill-defined hypoenhancement of the pancreatic head surrounding the common bile duct stent, in keeping with known pancreatic malignancy. Anterior peripancreatic lymph node is better evaluated on CT.  Unchanged position of a common bile duct stent with similar mild diffuse intrahepatic ductal dilation and pneumobilia. No evidence of metastatic disease in the abdomen or pelvis. Partially visualized cystic left adnexal lesion measuring up to 3.9 cm.   04/10/2021, patient underwent liver resection at [facility], by Dr. [name] Ducking.   Pathology report from [facility] was reviewed. Segments 3 partial hepatectomy, negative for viable tumor. Section 5/6 partial hepatectomy part 1 and 2, microscopic foci of residual viable adenocarcinoma, morphologically consistent with history of pancreatic primary.  Parenchymal margin is uninvolved.  #Patient resumed on FOLFIRI on 04/28/2021. She got another cycle on 05/12/2021 #05/26/2021, patient did not get additional chemotherapy due to transaminitis.  Shared decision was made to stop FOLFIRI and switch to gemcitabine Abraxane treatments.  05/26/2021, patient developed transaminitis, AST 763, ALT 691, alkaline phosphatase 364.  Bilirubin 0.7 05/26/2021 stat ultrasound abdomen right upper quadrant showed interval development of 3.3 x 1.8 cm complex mass in the right hepatic lobe.   Increased extrahepatic ductal dilatation.  Concerning for CBD obstruction. 05/29/2021, MRI abdomen MRCP with and without contrast showed unchanged pancreatic head soft tissue.  Severe intra and extrahepatic biliary ductal dilatation.  Common bile duct stents remain in position however patency is not established.  No evidence of lymphadenopathy or metastatic disease in the abdomen.  With further communication with radiologist, addendum was added that there is internal fluid signal and no appreciable associated contrast enhancement measuring 3.4 x 2.3 cm.  This appearance is generally not consistent with metastasis and is of uncertain significance.  Possibly reflecting hepatic abscess or residual of subcapsular hematoma.  Patient was seen by [facility] Dr. Elon Jester team.  He had ERCP done on 06/05/2021, with findings of One stent from the biliary tree was seen in the major papilla. The stent had migrated significantly into the duodenum. This is the cause of stent malfunction. One stent was removed from the biliary tree. Prior biliary sphincterotomy appeared open. - A single severe biliary stricture was found in the lower third of the main bile duct with upstream dilation. The stricture was alignant appearing.- The biliary tree was swept and sludge was found.- One fully covered metal stent was placed into the common bile duct across the stricture.- No pancreatogram performed.  06/12/2021-08/10/2021 gemcitabine and Abraxane.   10/14/2021, patient underwent Whipple procedure. Liver biopsy negative for malignancy. Review procedure showed invasive adenocarcinoma, moderately differentiated, centered in pancreatic head and confined in the Pancreas. Surgical margin is negative for malignancy.  36 lymph nodes were all negative for malignancy.  1 hepatic artery lymph node was harvested and was negative.  Gallbladder negative for malignancy.  Cystic duct excision negative for malignancy. pT1b pN0  10/25/2021, CT abdomen pelvis  with contrast showed rim-enhancing fluid collection in the region of the hepatic hilum. Patient had a readmission for superficial wound infection.  She completed antibiotics on 11/03/2021.  11/03/2021, CT abdomen pelvis with contrast showed near resolution of previously seen fluid collection in the region of the hepatic hilum.  Similar appearance of the area of hypoenhancement in the liver.  Attention on follow-up.  11/17/2021, resumed on gemcitabine/Abraxane. 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  12/12/ 2023 CT scan at [facility] shows no evidence of cancer recurrence.-NED 09/21/2022 CT chest abdomen pelvis w contrast at [facility] showed-No evidence of metastatic disease in the chest, abdomen, or pelvis. 12/21/2022 CT chest abdomen pelvis w contrast at [facility] showed-No evidence of metastatic disease in the chest, abdomen, or pelvis.   03/22/2023 CT scan at [facility] showed enlarging lung nodules. RLL 1cm, LLL 0.5cm, RUL 0.5cm Stable hypoattenuating right inferior hepatic lobe lesion measuring 0.9 cm.  Similar left adnexal cystic lesion measuring up to 5.0 cm.   04/06/2023 s/p lung nodule biopsy.  Rare atypical epithelioid cells present with evidence suggestive of invasion, suspicious but not diagnostic for malignancy.   05/20/2023: Right robot assisted thoracoscopic wedge resection with MLND (Dr. Rhona Raider)   Pathology:  Suspicious for malignancy but not diagnostic A. Lymph node, level 8, biopsy: Metastatic carcinoma involving one lymph node (1/1).  B. Lung, right lower lobe, wedge resection: Lung tissue positive for adenocarcinoma, consistent with metastasis from pancreas primary. See comment. Tumor size: 1.0 cm. Operative margin: Negative  Comment: Note is made of the patient's history of invasive  adenocarcinoma of the pancreatic head ([id number]; 10/14/2021), which is compared to the current case. Comparison is challenging given the post-treatment nature of the pancreatic tumor; however, there are some  morphologic similarities.  Although often unhelpful in the setting of lung primary versus pancreatic primary, immunohistochemical stains were performed. Tumor cells demonstrate the below immunoprofile which would could support pulmonary adenocarcinoma with enteric differentiation but would also be consistent with metastasis from the patient's known pancreatic primary; the latter is favored.              Positive                             Negative CK7 (strong, diffuse) TTF-1  CK20 (strong, focal)                    Positive and negative controls perform appropriately.    C. Lymph node, level 7, biopsy: Metastatic carcinoma involving one lymph node (1/1).  D. Lymph node, level 4R, biopsy: One lymph node, negative for malignancy (0/1).   INTERVAL HISTORY Cassandra [name] is a 68 y.o. female who has above history reviewed by me today presents for stage IV pancreatic cancer.   She tolerates Gemcitabine Abraxane 1 week on and 1 week off   She feels tired after chemotherapy. + chronic neuropathy /numbness on her toes, constant. She gets acupuncture session which helped her symptoms.   She takes calcium supplementation as instructed.  She take Creon for pancreatic insufficiency.    Review of Systems  Constitutional:  Positive for fatigue. Negative for appetite change, chills, fever and unexpected weight change.  HENT:   Negative for hearing loss and voice change.   Eyes:  Negative for eye problems.  Respiratory:  Negative for chest tightness and cough.   Cardiovascular:  Negative for chest pain.  Gastrointestinal:  Negative for abdominal distention, abdominal pain, blood in stool and nausea.  Endocrine: Negative for hot flashes.  Genitourinary:  Negative for difficulty urinating and frequency.   Musculoskeletal:  Negative for arthralgias.       Leg cramps  Skin:  Negative for itching and rash.  Neurological:  Positive for numbness. Negative for extremity weakness.  Hematological:  Negative  for adenopathy.  Psychiatric/Behavioral:  Negative for confusion.     MEDICAL HISTORY:  Past Medical History:  Diagnosis Date   Allergy    Anemia    Bacteremia due to Streptococcus pneumoniae 03/19/2020   Cancer [facility])    pancreatic cancer   Colon polyps    Family history of breast cancer    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   Neutropenia (HCC) 04/18/2020   Osteopenia after menopause 05/2017   femoral neck T score -2.0   Personal history of chemotherapy    current for pancreatic ca   PONV (postoperative nausea and vomiting)    Pure hypercholesterolemia    Sepsis (HCC) 03/19/2020    SURGICAL HISTORY: Past Surgical History:  Procedure Laterality Date   COLONOSCOPY  07/2015   WNL   COLONOSCOPY  2008/2011   COLONOSCOPY WITH PROPOFOL N/A 07/10/2021   Procedure: COLONOSCOPY WITH PROPOFOL;  Surgeon: Regis Bill, MD;  Location: [facility] ENDOSCOPY;  Service: Endoscopy;  Laterality: N/A;   IR CV LINE INJECTION  04/28/2021   OVARIAN CYST REMOVAL  1992   dermoid-Dr CAK   PORTA CATH INSERTION N/A 03/07/2020   Procedure: PORTA CATH INSERTION;  Surgeon: Annice Needy,  MD;  Location: [facility] INVASIVE CV LAB;  Service: Cardiovascular;  Laterality: N/A;   TEE WITHOUT CARDIOVERSION N/A 03/21/2020   Procedure: TRANSESOPHAGEAL ECHOCARDIOGRAM (TEE);  Surgeon: Laurier Nancy, MD;  Location: [facility] ORS;  Service: Cardiovascular;  Laterality: N/A;   TUBAL LIGATION  1993    SOCIAL HISTORY: Social History   Socioeconomic History   Marital status: Married    Spouse name: Not on file   Number of children: 2   Years of education: Not on file   Highest education level: Not on file  Occupational History   Occupation: Teacher    Comment: retired   Occupation: Visual merchandiser  Tobacco Use   Smoking status: Former    Current packs/day: 0.00    Average packs/day: 1 pack/day for 10.0 years (10.0 ttl pk-yrs)    Types: Cigarettes    Start date: 06/29/1975    Quit date: 06/28/1985    Years since  quitting: 38.2   Smokeless tobacco: Never   Tobacco comments:    Quit smoking 1987  Vaping Use   Vaping status: Never Used  Substance and Sexual Activity   Alcohol use: Yes    Alcohol/week: 5.0 standard drinks of alcohol    Types: 5 Glasses of wine per week    Comment: 0-2 mixed drinks a day   Drug use: No   Sexual activity: Not Currently    Birth control/protection: Post-menopausal  Other Topics Concern   Not on file  Social History Narrative   Not on file   Social Drivers of Health   Financial Resource Strain: Low Risk  (05/20/2023)   Received from [facility]   Overall Financial Resource Strain (CARDIA)    Difficulty of Paying Living Expenses: Not hard at all  Food Insecurity: No Food Insecurity (05/24/2023)   Hunger Vital Sign    Worried About Running Out of Food in the Last Year: Never true    Ran Out of Food in the Last Year: Never true  Transportation Needs: No Transportation Needs (05/24/2023)   PRAPARE - Administrator, Civil Service (Medical): No    Lack of Transportation (Non-Medical): No  Physical Activity: Insufficiently Active (10/09/2022)   Exercise Vital Sign    Days of Exercise per Week: 4 days    Minutes of Exercise per Session: 30 min  Stress: No Stress Concern Present (10/09/2022)   [employer] of Occupational Health - Occupational Stress Questionnaire    Feeling of Stress : Not at all  Social Connections: Unknown (10/09/2022)   Social Connection and Isolation Panel [NHANES]    Frequency of Communication with Friends and Family: More than three times a week    Frequency of Social Gatherings with Friends and Family: More than three times a week    Attends Religious Services: Not on Insurance claims handler of Clubs or Organizations: No    Attends Banker Meetings: Never    Marital Status: Married  Catering manager Violence: Not At Risk (05/24/2023)   Humiliation, Afraid, Rape, and Kick questionnaire    Fear  of Current or Ex-Partner: No    Emotionally Abused: No    Physically Abused: No    Sexually Abused: No    FAMILY HISTORY: Family History  Problem Relation Age of Onset   Breast cancer Paternal Aunt 40   Diabetes Mother    Osteoporosis Mother    Hyperlipidemia Father    Rheumatic fever Father    Valvular heart disease Father  Cancer Paternal Grandmother        possible colon   Breast cancer Sister 51    ALLERGIES:  is allergic to penicillin g.  MEDICATIONS:  Current Outpatient Medications  Medication Sig Dispense Refill   b complex vitamins capsule Take 1 capsule by mouth daily.     Calcium Carbonate-Vit D-Min (CALCIUM 1200 PO) Take by mouth.     Cholecalciferol 25 MCG (1000 UT) tablet Take 2,000 Units by mouth daily.     clobetasol cream (TEMOVATE) 0.05 %      fluticasone (FLONASE) 50 MCG/ACT nasal spray Place 1 spray into both nostrils daily.     lidocaine-prilocaine (EMLA) cream Apply small amount to port and cover with saran wrap 1-2 hours prior to port access 30 g 6   loratadine (CLARITIN REDITABS) 10 MG dissolvable tablet Take 10 mg by mouth daily.     Melatonin 2.5 MG CHEW Chew by mouth.     Multiple Vitamins-Minerals (MULTIVITAMIN WITH MINERALS) tablet Take 1 tablet by mouth daily.     Pancrelipase, Lip-Prot-Amyl, (CREON) 24000-76000 units CPEP TAKE 1 CAPSULE THREE TIMES DAILY BEFORE MEALS 200 capsule 3   senna-docusate (SENOKOT-S) 8.6-50 MG tablet Take by mouth.     simethicone (GAS-X) 80 MG chewable tablet Chew 1 tablet (80 mg total) by mouth every 8 (eight) hours as needed for flatulence. 60 tablet 0   No current facility-administered medications for this visit.   Facility-Administered Medications Ordered in Other Visits  Medication Dose Route Frequency Provider Last Rate Last Admin   0.9 %  sodium chloride infusion   Intravenous Continuous [name], MD       calcium gluconate 2 g in sodium chloride 0.9 % 100 mL IVPB  2 g Intravenous PRN [name], MD        gemcitabine (GEMZAR) 1,710 mg in sodium chloride 0.9 % 250 mL chemo infusion  1,000 mg/m2 (Order-Specific) Intravenous Once [name], MD       heparin lock flush 100 UNIT/ML injection            PACLitaxel-protein bound (ABRAXANE) chemo infusion 175 mg  100 mg/m2 (Order-Specific) Intravenous Once [name], MD       prochlorperazine (COMPAZINE) 10 MG tablet            sodium chloride flush (NS) 0.9 % injection 10 mL  10 mL Intravenous PRN [name], MD   10 mL at 02/18/21 0858   sodium chloride flush (NS) 0.9 % injection 10 mL  10 mL Intravenous Once Borders, Ivin Booty R, NP       sodium chloride flush (NS) 0.9 % injection 10 mL  10 mL Intravenous Once [name], MD         PHYSICAL EXAMINATION: ECOG PERFORMANCE STATUS: 0 - Asymptomatic Vitals:   09/12/23 0857  BP: 134/80  Pulse: (!) 58  Resp: 18  Temp: 98.2 F (36.8 C)  SpO2: 100%   Filed Weights   09/12/23 0857  Weight: 135 lb 12.8 oz (61.6 kg)    Physical Exam Constitutional:      General: She is not in acute distress. HENT:     Head: Normocephalic and atraumatic.  Eyes:     General: No scleral icterus. Cardiovascular:     Rate and Rhythm: Normal rate and regular rhythm.     Heart sounds: Normal heart sounds.  Pulmonary:     Effort: Pulmonary effort is normal. No respiratory distress.  Abdominal:     General: Bowel sounds are normal.  There is no distension.     Palpations: Abdomen is soft.  Musculoskeletal:        General: No deformity. Normal range of motion.     Cervical back: Normal range of motion and neck supple.  Skin:    General: Skin is warm.     Coloration: Skin is not jaundiced.     Findings: No erythema.  Neurological:     Mental Status: She is alert and oriented to person, place, and time. Mental status is at baseline.  Psychiatric:        Mood and Affect: Mood normal.     LABORATORY DATA:  I have reviewed the data as listed    Latest Ref Rng & Units 09/12/2023    8:45 AM 08/29/2023    8:05 AM  08/15/2023    8:46 AM  CBC  WBC 4.0 - 10.5 K/uL 4.9  4.8  4.9   Hemoglobin 12.0 - 15.0 g/dL 16.1  09.6  04.5   Hematocrit 36.0 - 46.0 % 34.6  34.7  34.5   Platelets 150 - 400 K/uL 163  163  180       Latest Ref Rng & Units 09/12/2023    8:45 AM 08/29/2023    8:05 AM 08/15/2023    8:46 AM  CMP  Glucose 70 - 99 mg/dL 409  811  914   BUN 8 - 23 mg/dL 19  11  11    Creatinine 0.44 - 1.00 mg/dL 7.82  9.56  2.13   Sodium 135 - 145 mmol/L 137  139  138   Potassium 3.5 - 5.1 mmol/L 4.0  3.8  3.9   Chloride 98 - 111 mmol/L 107  106  106   CO2 22 - 32 mmol/L 24  24  24    Calcium 8.9 - 10.3 mg/dL 8.4  8.7  8.4   Total Protein 6.5 - 8.1 g/dL 6.2  6.4  6.6   Total Bilirubin 0.0 - 1.2 mg/dL 0.4  0.9  0.8   Alkaline Phos 38 - 126 U/L 64  74  69   AST 15 - 41 U/L 29  25  31    ALT 0 - 44 U/L 27  22  28       RADIOGRAPHIC STUDIES: I have personally reviewed the radiological images as listed and agreed with the findings in the report. Reviewed findings of MRI abdomen MRCP done at [facility]. CT CHEST ABDOMEN PELVIS W CONTRAST Result Date: 07/04/2023 CLINICAL DATA:  Pancreatic cancer. Staging. On chemotherapy. * Tracking Code: BO * EXAM: CT CHEST, ABDOMEN, AND PELVIS WITH CONTRAST TECHNIQUE: Multidetector CT imaging of the chest, abdomen and pelvis was performed following the standard protocol during bolus administration of intravenous contrast. RADIATION DOSE REDUCTION: This exam was performed according to the departmental dose-optimization program which includes automated exposure control, adjustment of the mA and/or kV according to patient size and/or use of iterative reconstruction technique. CONTRAST:  80mL OMNIPAQUE IOHEXOL 300 MG/ML  SOLN COMPARISON:  Outside CT 03/22/2023 and older. Most recent outside CT scan is without report. Clinic note reviewed in epic. FINDINGS: CT CHEST FINDINGS Cardiovascular: Heart is nonenlarged. No significant pericardial effusion. Ectasia of the ascending aorta is noted. This  measures up to 4.4 cm at the level of the main pulmonary artery today. Previously 4.4 cm as well. Minimal atherosclerotic calcified plaque along the thoracic aorta particularly the arch and descending. Mild plaque along the origin of the great vessels. Right IJ chest port in place with the  tip seen as far as the central SVC above the right atrium. Mediastinum/Nodes: Small thyroid gland. Grossly normal course and caliber of the thoracic esophagus. No specific abnormal lymph node enlargement identified in the thorax including supraclavicular, chest wall, internal mammary chain, axillary regions, hilum or mediastinum. Lungs/Pleura: There is a interval surgical changes along the right lower lobe. The previous nodule in the right lower lobe which measured 12 mm and with subpleural is no longer seen. There is some bandlike opacity in this location with pleural thickening, possibly posttreatment related. There are some other nodules however identified. Example includes left lower lobe measuring 5 mm on series 4, image 108 today. On the prior this measured 3 mm. Multiple juxtapleural nodules left lower lobe more superiorly such as series 4, image 73 measuring 10 mm today and previously 6 mm. Posterolateral left upper lobe lesion measuring 9 mm on series 4, image 38 today and previously 5 mm. Anterior right midlung lesion measuring 5 mm today on series 4, image 66, inferior right upper lobe and previously 5 mm. No pleural effusion, consolidation pneumothorax otherwise. Musculoskeletal: Mild degenerative changes. CT ABDOMEN PELVIS FINDINGS Hepatobiliary: Subtle low-attenuation lesion along the inferior aspect of the right hepatic lobe which previously measured 9 mm, today is essentially stable on series 2, image 68 of the axial data set. Coronal image 30 of series 5. No new space-occupying liver lesion clearly seen today. Patent portal vein. Gallbladder is absent. Pancreas: Surgical changes from Whipple procedure. 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 No recurrent mass lesion along the surgical margin. Spleen: Tiny low-attenuation splenic lesion on series 2, image 61 is too small to completely characterize but unchanged. Likely a benign cystic lesion. Adrenals/Urinary Tract: Adrenal glands are unremarkable. Kidneys are normal, without renal calculi, focal lesion, or hydronephrosis. Bladder is unremarkable. 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 On this non oral contrast exam the residual stomach, small bowel and large bowel has a normal course and caliber. There is scattered colonic stool identified. Normal retrocecal appendix. Vascular/Lymphatic: Normal caliber aorta and IVC with scattered vascular calcifications. There is some abnormal soft tissue identified in the central mesentery just to the right of the SMV on axial series 2, image 72 measuring 13 by 11 mm. Please see coronal image 27 of series 5. Slight stranding in the mesentery. This structure is just caudal to the surgical margin. Reproductive: Uterus is present. Left adnexal cystic structures again identified, likely ovarian. Today measuring 5.0 by 3.4 cm and previously 5.4 by 3.8 cm. Other: No free intra-air.  No ascites. Musculoskeletal: Mild degenerative changes along the spine and pelvis. Particularly at L5-S1 of the spine. IMPRESSION: Surgical changes from prior Whipple procedure. Developing abnormal soft tissue in the central mesentery just to the right of the SMV and caudal to the surgical bed worrisome for an area of worsening disease. Multiple small lung nodules identified. Several are slightly larger today as well. Interval surgical changes along the right lower lobe with parenchymal lung opacity and pleural thickening. Stable right hepatic lobe liver lesion. No new liver disease identified. Stable left adnexal, likely ovarian cystic  lesion. 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 Recommend annual imaging followup by CTA or MRA. This recommendation follows 2010 ACCF/AHA/AATS/ACR/ASA/SCA/SCAI/SIR/STS/SVM Guidelines for the Diagnosis and Management of Patients with Thoracic Aortic Disease. Circulation. 2010; 121: [id number]. Aortic aneurysm NOS (ICD10-I71.9) Findings will be called to the ordering service by  the Radiology physician assistant team. Electronically Signed   By: Karen Kays M.D.   On: 07/04/2023 16:14

## 2023-09-13 LAB — CEA: CEA: 4.9 ng/mL — ABNORMAL HIGH (ref 0.0–4.7)

## 2023-09-13 LAB — CANCER ANTIGEN 19-9: CA 19-9: 6 U/mL (ref 0–35)

## 2023-09-26 ENCOUNTER — Inpatient Hospital Stay: Payer: Medicare Other

## 2023-09-26 ENCOUNTER — Inpatient Hospital Stay (HOSPITAL_BASED_OUTPATIENT_CLINIC_OR_DEPARTMENT_OTHER): Payer: Medicare Other | Admitting: Oncology

## 2023-09-26 ENCOUNTER — Encounter: Payer: Self-pay | Admitting: Oncology

## 2023-09-26 VITALS — BP 116/75 | HR 58 | Resp 18

## 2023-09-26 VITALS — Temp 98.1°F

## 2023-09-26 DIAGNOSIS — G620 Drug-induced polyneuropathy: Secondary | ICD-10-CM

## 2023-09-26 DIAGNOSIS — K8689 Other specified diseases of pancreas: Secondary | ICD-10-CM | POA: Diagnosis not present

## 2023-09-26 DIAGNOSIS — Z5111 Encounter for antineoplastic chemotherapy: Secondary | ICD-10-CM | POA: Diagnosis not present

## 2023-09-26 DIAGNOSIS — C259 Malignant neoplasm of pancreas, unspecified: Secondary | ICD-10-CM

## 2023-09-26 DIAGNOSIS — T451X5A Adverse effect of antineoplastic and immunosuppressive drugs, initial encounter: Secondary | ICD-10-CM

## 2023-09-26 LAB — CBC WITH DIFFERENTIAL (CANCER CENTER ONLY)
Abs Immature Granulocytes: 0.02 10*3/uL (ref 0.00–0.07)
Basophils Absolute: 0 10*3/uL (ref 0.0–0.1)
Basophils Relative: 1 %
Eosinophils Absolute: 0.2 10*3/uL (ref 0.0–0.5)
Eosinophils Relative: 4 %
HCT: 34.3 % — ABNORMAL LOW (ref 36.0–46.0)
Hemoglobin: 11.4 g/dL — ABNORMAL LOW (ref 12.0–15.0)
Immature Granulocytes: 0 %
Lymphocytes Relative: 23 %
Lymphs Abs: 1 10*3/uL (ref 0.7–4.0)
MCH: 33 pg (ref 26.0–34.0)
MCHC: 33.2 g/dL (ref 30.0–36.0)
MCV: 99.4 fL (ref 80.0–100.0)
Monocytes Absolute: 0.6 10*3/uL (ref 0.1–1.0)
Monocytes Relative: 13 %
Neutro Abs: 2.7 10*3/uL (ref 1.7–7.7)
Neutrophils Relative %: 59 %
Platelet Count: 172 10*3/uL (ref 150–400)
RBC: 3.45 MIL/uL — ABNORMAL LOW (ref 3.87–5.11)
RDW: 13.5 % (ref 11.5–15.5)
WBC Count: 4.5 10*3/uL (ref 4.0–10.5)
nRBC: 0 % (ref 0.0–0.2)

## 2023-09-26 LAB — CMP (CANCER CENTER ONLY)
ALT: 28 U/L (ref 0–44)
AST: 32 U/L (ref 15–41)
Albumin: 3.9 g/dL (ref 3.5–5.0)
Alkaline Phosphatase: 77 U/L (ref 38–126)
Anion gap: 6 (ref 5–15)
BUN: 13 mg/dL (ref 8–23)
CO2: 24 mmol/L (ref 22–32)
Calcium: 8.6 mg/dL — ABNORMAL LOW (ref 8.9–10.3)
Chloride: 111 mmol/L (ref 98–111)
Creatinine: 0.72 mg/dL (ref 0.44–1.00)
GFR, Estimated: >60 mL/min (ref >60)
Glucose, Bld: 99 mg/dL (ref 70–99)
Potassium: 3.9 mmol/L (ref 3.5–5.1)
Sodium: 141 mmol/L (ref 135–145)
Total Bilirubin: 0.7 mg/dL (ref 0.0–1.2)
Total Protein: 6.3 g/dL — ABNORMAL LOW (ref 6.5–8.1)

## 2023-09-26 MED ORDER — SODIUM CHLORIDE 0.9 % IV SOLN
1000.0000 mg/m2 | Freq: Once | INTRAVENOUS | Status: AC
  Administered 2023-09-26: 1710 mg via INTRAVENOUS
  Filled 2023-09-26: qty 44.97

## 2023-09-26 MED ORDER — PACLITAXEL PROTEIN-BOUND CHEMO INJECTION 100 MG
100.0000 mg/m2 | Freq: Once | INTRAVENOUS | Status: AC
  Administered 2023-09-26: 175 mg via INTRAVENOUS
  Filled 2023-09-26: qty 35

## 2023-09-26 MED ORDER — PROCHLORPERAZINE MALEATE 10 MG PO TABS
10.0000 mg | ORAL_TABLET | Freq: Once | ORAL | Status: AC
  Administered 2023-09-26: 10 mg via ORAL
  Filled 2023-09-26: qty 1

## 2023-09-26 MED ORDER — SODIUM CHLORIDE 0.9 % IV SOLN
INTRAVENOUS | Status: DC
  Filled 2023-09-26: qty 250

## 2023-09-26 NOTE — Progress Notes (Signed)
 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  Pathology proved liver metastatic disease- additional chemotherapy --> whipple procedure.ypT1b ypN0--> adjuvant Gem/Abraxane finished in August 2023--> Sept 2024 CT showed progression in lung --> Nov 2024 Wedge biopsy- Recurrent pancreatic cancer with lung metastasis.  Currently on Gemcitabine and Abraxane 1 week on 1 week off schedule due to neutropenia.  Labs are reviewed and discussed with patient.  Proceed with Gemcitabine and Abraxane.    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  She did not tolerate Cymbalta.   Stable symptom Acupuncture sessions as needed  Encounter for antineoplastic chemotherapy Chemotherapy plan as listed above  Hypocalcemia Likely due to malabsorption. Continue calcium supplementation, 2000 mg daily-divided in 2-3 doses IV calcium gluconate 2g if calcium <=8.5 Check vitamin D level   Pancreatic insufficiency S/p  Whipple procedure..  Continue Creon, continue 19147 units TID with meals and  with her snacks  Orders Placed This Encounter  Procedures   Cancer antigen 19-9    Standing Status:   Future    Expected Date:   10/10/2023    Expiration Date:   10/09/2024   CEA    Standing Status:   Future    Expected Date:   10/10/2023    Expiration Date:   10/09/2024   CBC with Differential (Cancer Center Only)    Standing Status:   Future    Expected Date:    10/10/2023    Expiration Date:   10/09/2024   CMP (Cancer Center only)    Standing Status:   Future    Expected Date:   10/10/2023    Expiration Date:   10/09/2024   CBC with Differential (Cancer Center Only)    Standing Status:   Future    Expected Date:   10/24/2023    Expiration Date:   10/23/2024   CMP (Cancer Center only)    Standing Status:   Future    Expected Date:   10/24/2023    Expiration Date:   10/23/2024    Follow up  2 weeks lab MD Gemcitaibine Abraxane    All questions were answered. The patient knows to call the clinic with any problems, questions or concerns.  [name], MD, PhD [facility] Hematology Oncology 09/26/2023   [name], MD    HISTORY OF PRESENTING ILLNESS:   CHEYEANNE ROADCAP is a  68 y.o.  female with presents for follow up of Stage IV pancreatic adenocarcinoma Patient initially presented with jaundice, transaminitis, bilirubin was 9.9.  CA 19-9 was 1874.  Patient also reports unintentional weight loss. 02/08/2020 MRI abdomen and MRCP with and without contrast was done at [facility] which showed pancreatic head mass measuring up to 3 cm, with marked associated narrowing of the portal vein confluence.  SMA is preserved.  Marked intrahepatic and extrahepatic biliary duct dilatation as well as mild dilatation of the main pancreatic duct. Multiple hepatic masses highly concerning for metastatic disease.  Patient underwent EUS on 02/19/2020, which showed irregular mass identified in the pancreatic head, hypoechoic, measured 70mmx33mm, sonographic evidence  concerning for invasion into the superior mesenteric artery.  There is no sign of significant abnormality in the main pancreatic duct.  Dilatation of common bile duct which measured up to 16 mm.  Region of celiac artery was visualized and showed no signs of significant abnormality.  No lymphadenopathy.  FNA showed adenocarcinoma.  02/19/2020, ERCP, malignant.  Biliary stricture was found at the mid/lower third of the medial  bile duct with upstream ductal dilatation.  The stricture was treated with placement of wall flex metal stent.  Patient was seen by [facility] oncology Dr. Olga Millers and was recommended for 3 drug regimen FOLFIRINOX.  Patient prefers to do chemotherapy locally at [facility].  Patient was referred to establish care today. She denies any pain.  Since stent placement, skin jaundice has improved.  Itchiness has also improved. Patient was accompanied by her husband today.  She has a family history of breast cancer in sister and paternal aunt, colon cancer paternal grandmother.  #No reportable targetable mutation on NGS 9/14/2021cycle 1 FOLFIRINOX.  Patient received oxaliplatin and about 50% of Irinotecan on day 1 and had experienced neurologic symptoms.  She went to ER and working diagnosis is TIA, and eventually I think this is due to irinotecan side effects -irinotecan-associated dysarthria, lip/tongue numbness.  Adjustment was made for Irinotecan to be  infused over 180 minutes.  Atropine 0.5 mg once prior to the irinotecan. No recurrent symptoms.   # 03/19/2020-03/21/2020 patient was admitted due to sepsis with strep pneumonia bacteremia.  Patient was treated with IV Rocephin.  TEE was done which showed no vegetation.  No PFO or ASD.  Patient was discharged home and he finished full course of 14 days of IV Rocephin on 04/02/2020 per ID recommendation.  Repeat blood culture was also negative.  08/25/20 cycle 10 FOLFIRINOX 09/08/20- present  Starting cycle 11, FOLFIRI, Oxaliplatin discontinued due to neuropathy   #NGS showed no reportable targetable mutation #Genetic testing-[employer] diagnostic testing showed no pathological variants identified. MS stable, TMB 86mut/mb, KRAS G12D, SF3B1 K700E, TP53 V229fs*30  #01/14/2021  CT done at [facility] during the interval, stable disease.  # Her case was presented by Dr.Jia from [facility] tumor board. Clayborne Artist were 4~5 liver lesions on OSH MRI last year at the time of diagnosis highly  suspicious for metastasis. She is not eligible for surgical protocol for metastasis resection given the number of liver metastasis is >3. However given her excellent and durable response to chemo, surgical resection may be considered pending sustained disease control at 1 year and may require partial hepatectomy first to see if there is viable tumor before proceeding to Whipple resection ]  Patient had a COVID-19 infection in September 2022.  03/23/2021 CT abdomen pelvis No significant change in the ill-defined pancreatic head mass or  associated biliary ductal and pancreatic ductal dilation.  Slight interval enlargement of subcentimeter anterior peripancreatic  lymph node, nonspecific. Attention on follow-up per clinical protocol. Similar enlargement of the ascending thoracic aorta measuring 4.4 cm  03/23/2021 MRI abdomen w and wo  Increasing ill-defined hypoenhancement of the pancreatic head surrounding the common bile duct stent, in keeping with known pancreatic malignancy. Anterior peripancreatic lymph node is better evaluated on CT.  Unchanged position of a common bile duct stent with similar mild diffuse intrahepatic ductal dilation and pneumobilia. No evidence of metastatic disease in the abdomen or pelvis. Partially visualized cystic left adnexal lesion measuring up to 3.9 cm.   04/10/2021, patient underwent liver resection at [facility], by Dr. [name] Ducking.   Pathology  report from [facility] was reviewed. Segments 3 partial hepatectomy, negative for viable tumor. Section 5/6 partial hepatectomy part 1 and 2, microscopic foci of residual viable adenocarcinoma, morphologically consistent with history of pancreatic primary.  Parenchymal margin is uninvolved.  #Patient resumed on FOLFIRI on 04/28/2021. She got another cycle on 05/12/2021 #05/26/2021, patient did not get additional chemotherapy due to transaminitis.  Shared decision was made to stop FOLFIRI and switch to gemcitabine Abraxane  treatments.  05/26/2021, patient developed transaminitis, AST 763, ALT 691, alkaline phosphatase 364.  Bilirubin 0.7 05/26/2021 stat ultrasound abdomen right upper quadrant showed interval development of 3.3 x 1.8 cm complex mass in the right hepatic lobe.  Increased extrahepatic ductal dilatation.  Concerning for CBD obstruction. 05/29/2021, MRI abdomen MRCP with and without contrast showed unchanged pancreatic head soft tissue.  Severe intra and extrahepatic biliary ductal dilatation.  Common bile duct stents remain in position however patency is not established.  No evidence of lymphadenopathy or metastatic disease in the abdomen.  With further communication with radiologist, addendum was added that there is internal fluid signal and no appreciable associated contrast enhancement measuring 3.4 x 2.3 cm.  This appearance is generally not consistent with metastasis and is of uncertain significance.  Possibly reflecting hepatic abscess or residual of subcapsular hematoma.  Patient was seen by [facility] Dr. Elon Jester team.  He had ERCP done on 06/05/2021, with findings of One stent from the biliary tree was seen in the major papilla. The stent had migrated significantly into the duodenum. This is the cause of stent malfunction. One stent was removed from the biliary tree. Prior biliary sphincterotomy appeared open. - A single severe biliary stricture was found in the lower third of the main bile duct with upstream dilation. The stricture was alignant appearing.- The biliary tree was swept and sludge was found.- One fully covered metal stent was placed into the common bile duct across the stricture.- No pancreatogram performed.  06/12/2021-08/10/2021 gemcitabine and Abraxane.   10/14/2021, patient underwent Whipple procedure. Liver biopsy negative for malignancy. Review procedure showed invasive adenocarcinoma, moderately differentiated, centered in pancreatic head and confined in the Pancreas. Surgical margin is  negative for malignancy.  36 lymph nodes were all negative for malignancy.  1 hepatic artery lymph node was harvested and was negative.  Gallbladder negative for malignancy.  Cystic duct excision negative for malignancy. pT1b pN0  10/25/2021, CT abdomen pelvis with contrast showed rim-enhancing fluid collection in the region of the hepatic hilum. Patient had a readmission for superficial wound infection.  She completed antibiotics on 11/03/2021.  11/03/2021, CT abdomen pelvis with contrast showed near resolution of previously seen fluid collection in the region of the hepatic hilum.  Similar appearance of the area of hypoenhancement in the liver.  Attention on follow-up.  11/17/2021, resumed on gemcitabine/Abraxane. 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  12/12/ 2023 CT scan at [place] shows no evidence of cancer recurrence.-NED 09/21/2022 CT chest abdomen pelvis w contrast at [facility] showed-No evidence of metastatic disease in the chest, abdomen, or pelvis. 12/21/2022 CT chest abdomen pelvis w contrast at [facility] showed-No evidence of metastatic disease in the chest, abdomen, or pelvis.   03/22/2023 CT scan at [facility] showed enlarging lung nodules. RLL 1cm, LLL 0.5cm, RUL 0.5cm Stable hypoattenuating right inferior hepatic lobe lesion measuring 0.9 cm.  Similar left adnexal cystic lesion measuring up to 5.0 cm.   04/06/2023 s/p lung nodule biopsy.  Rare atypical epithelioid cells present with evidence suggestive of invasion, suspicious but not diagnostic  for malignancy.   05/20/2023: Right robot assisted thoracoscopic wedge resection with MLND (Dr. Rhona Raider)   Pathology:  Suspicious for malignancy but not diagnostic A. Lymph node, level 8, biopsy: Metastatic carcinoma involving one lymph node (1/1).  B. Lung, right lower lobe, wedge resection: Lung tissue positive for adenocarcinoma, consistent with metastasis from pancreas primary. See comment. Tumor size: 1.0 cm. Operative margin: Negative  Comment:  Note is made of the patient's history of invasive adenocarcinoma of the pancreatic head ([id number]; 10/14/2021), which is compared to the current case. Comparison is challenging given the post-treatment nature of the pancreatic tumor; however, there are some morphologic similarities.  Although often unhelpful in the setting of lung primary versus pancreatic primary, immunohistochemical stains were performed. Tumor cells demonstrate the below immunoprofile which would could support pulmonary adenocarcinoma with enteric differentiation but would also be consistent with metastasis from the patient's known pancreatic primary; the latter is favored.              Positive                             Negative CK7 (strong, diffuse) TTF-1  CK20 (strong, focal)                    Positive and negative controls perform appropriately.    C. Lymph node, level 7, biopsy: Metastatic carcinoma involving one lymph node (1/1).  D. Lymph node, level 4R, biopsy: One lymph node, negative for malignancy (0/1).   INTERVAL HISTORY DONABELLE MOLDEN is a 68 y.o. female who has above history reviewed by me today presents for stage IV pancreatic cancer.   She tolerates Gemcitabine Abraxane 1 week on and 1 week off   She feels tired after chemotherapy. 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  She gets acupuncture session which helped her symptoms.   She takes calcium supplementation as instructed.  She take Creon for pancreatic insufficiency.    Review of Systems  Constitutional:  Positive for fatigue. Negative for appetite change, chills, fever and unexpected weight change.  HENT:   Negative for hearing loss and voice change.   Eyes:  Negative for eye problems.  Respiratory:  Negative for chest tightness and cough.   Cardiovascular:  Negative for chest pain.  Gastrointestinal:  Negative for abdominal distention, abdominal pain, blood in stool and nausea.   Endocrine: Negative for hot flashes.  Genitourinary:  Negative for difficulty urinating and frequency.   Musculoskeletal:  Negative for arthralgias.       Leg cramps  Skin:  Negative for itching and rash.  Neurological:  Positive for numbness. Negative for extremity weakness.  Hematological:  Negative for adenopathy.  Psychiatric/Behavioral:  Negative for confusion.     MEDICAL HISTORY:  Past Medical History:  Diagnosis Date   Allergy    Anemia    Bacteremia due to Streptococcus pneumoniae 03/19/2020   Cancer [facility])    pancreatic cancer   Colon polyps    Family history of breast cancer    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   Neutropenia (HCC) 04/18/2020   Osteopenia after menopause 05/2017   femoral neck T score -2.0   Personal history of chemotherapy    current for pancreatic ca   PONV (postoperative nausea and vomiting)    Pure hypercholesterolemia    Sepsis (HCC) 03/19/2020    SURGICAL HISTORY:  Past Surgical History:  Procedure Laterality Date   COLONOSCOPY  07/2015   WNL   COLONOSCOPY  2008/2011   COLONOSCOPY WITH PROPOFOL N/A 07/10/2021   Procedure: COLONOSCOPY WITH PROPOFOL;  Surgeon: Regis Bill, MD;  Location: [facility] ENDOSCOPY;  Service: Endoscopy;  Laterality: N/A;   IR CV LINE INJECTION  04/28/2021   OVARIAN CYST REMOVAL  1992   dermoid-Dr CAK   PORTA CATH INSERTION N/A 03/07/2020   Procedure: PORTA CATH INSERTION;  Surgeon: Annice Needy, MD;  Location: [facility] INVASIVE CV LAB;  Service: Cardiovascular;  Laterality: N/A;   TEE WITHOUT CARDIOVERSION N/A 03/21/2020   Procedure: TRANSESOPHAGEAL ECHOCARDIOGRAM (TEE);  Surgeon: Laurier Nancy, MD;  Location: [facility] ORS;  Service: Cardiovascular;  Laterality: N/A;   TUBAL LIGATION  1993    SOCIAL HISTORY: Social History   Socioeconomic History   Marital status: Married    Spouse name: Not on file   Number of children: 2   Years of education: Not on file   Highest education level: Not on file   Occupational History   Occupation: Teacher    Comment: retired   Occupation: Visual merchandiser  Tobacco Use   Smoking status: Former    Current packs/day: 0.00    Average packs/day: 1 pack/day for 10.0 years (10.0 ttl pk-yrs)    Types: Cigarettes    Start date: 06/29/1975    Quit date: 06/28/1985    Years since quitting: 38.2   Smokeless tobacco: Never   Tobacco comments:    Quit smoking 1987  Vaping Use   Vaping status: Never Used  Substance and Sexual Activity   Alcohol use: Yes    Alcohol/week: 5.0 standard drinks of alcohol    Types: 5 Glasses of wine per week    Comment: 0-2 mixed drinks a day   Drug use: No   Sexual activity: Not Currently    Birth control/protection: Post-menopausal  Other Topics Concern   Not on file  Social History Narrative   Not on file   Social Drivers of Health   Financial Resource Strain: Low Risk  (05/20/2023)   Received from [facility]   Overall Financial Resource Strain (CARDIA)    Difficulty of Paying Living Expenses: Not hard at all  Food Insecurity: No Food Insecurity (05/24/2023)   Hunger Vital Sign    Worried About Running Out of Food in the Last Year: Never true    Ran Out of Food in the Last Year: Never true  Transportation Needs: No Transportation Needs (05/24/2023)   PRAPARE - Administrator, Civil Service (Medical): No    Lack of Transportation (Non-Medical): No  Physical Activity: Insufficiently Active (10/09/2022)   Exercise Vital Sign    Days of Exercise per Week: 4 days    Minutes of Exercise per Session: 30 min  Stress: No Stress Concern Present (10/09/2022)   [employer] of Occupational Health - Occupational Stress Questionnaire    Feeling of Stress : Not at all  Social Connections: Unknown (10/09/2022)   Social Connection and Isolation Panel [NHANES]    Frequency of Communication with Friends and Family: More than three times a week    Frequency of Social Gatherings with Friends and Family:  More than three times a week    Attends Religious Services: Not on file    Active Member of Clubs or Organizations: No    Attends Banker Meetings: Never    Marital Status: Married  Catering manager Violence: Not  At Risk (05/24/2023)   Humiliation, Afraid, Rape, and Kick questionnaire    Fear of Current or Ex-Partner: No    Emotionally Abused: No    Physically Abused: No    Sexually Abused: No    FAMILY HISTORY: Family History  Problem Relation Age of Onset   Breast cancer Paternal Aunt 21   Diabetes Mother    Osteoporosis Mother    Hyperlipidemia Father    Rheumatic fever Father    Valvular heart disease Father    Cancer Paternal Grandmother        possible colon   Breast cancer Sister 49    ALLERGIES:  is allergic to penicillin g.  MEDICATIONS:  Current Outpatient Medications  Medication Sig Dispense Refill   aspirin 81 MG chewable tablet Chew 81 mg by mouth daily.     b complex vitamins capsule Take 1 capsule by mouth daily.     Calcium Carbonate-Vit D-Min (CALCIUM 1200 PO) Take by mouth.     Cholecalciferol 25 MCG (1000 UT) tablet Take 2,000 Units by mouth daily.     clobetasol cream (TEMOVATE) 0.05 %      fluticasone (FLONASE) 50 MCG/ACT nasal spray Place 1 spray into both nostrils daily.     lidocaine-prilocaine (EMLA) cream Apply small amount to port and cover with saran wrap 1-2 hours prior to port access 30 g 6   loratadine (CLARITIN REDITABS) 10 MG dissolvable tablet Take 10 mg by mouth daily.     Melatonin 2.5 MG CHEW Chew by mouth.     Multiple Vitamins-Minerals (MULTIVITAMIN WITH MINERALS) tablet Take 1 tablet by mouth daily.     Pancrelipase, Lip-Prot-Amyl, (CREON) 24000-76000 units CPEP TAKE 1 CAPSULE THREE TIMES DAILY BEFORE MEALS 200 capsule 3   senna-docusate (SENOKOT-S) 8.6-50 MG tablet Take by mouth.     simethicone (GAS-X) 80 MG chewable tablet Chew 1 tablet (80 mg total) by mouth every 8 (eight) hours as needed for flatulence. 60 tablet 0    No current facility-administered medications for this visit.   Facility-Administered Medications Ordered in Other Visits  Medication Dose Route Frequency Provider Last Rate Last Admin   0.9 %  sodium chloride infusion   Intravenous Continuous [name], MD 10 mL/hr at 09/12/23 1241 Infusion Verify at 09/12/23 1241   calcium gluconate 2 g in sodium chloride 0.9 % 100 mL IVPB  2 g Intravenous PRN [name], MD   Stopped at 09/12/23 1101   heparin lock flush 100 UNIT/ML injection            prochlorperazine (COMPAZINE) 10 MG tablet            sodium chloride flush (NS) 0.9 % injection 10 mL  10 mL Intravenous PRN [name], MD   10 mL at 02/18/21 0858   sodium chloride flush (NS) 0.9 % injection 10 mL  10 mL Intravenous Once Borders, Ivin Booty R, NP       sodium chloride flush (NS) 0.9 % injection 10 mL  10 mL Intravenous Once [name], MD         PHYSICAL EXAMINATION: ECOG PERFORMANCE STATUS: 0 - Asymptomatic Vitals:   09/26/23 0856  BP: 116/75  Pulse: (!) 58  Resp: 18  SpO2: 99%   There were no vitals filed for this visit.   Physical Exam Constitutional:      General: She is not in acute distress. HENT:     Head: Normocephalic and atraumatic.  Eyes:     General: No scleral  icterus. Cardiovascular:     Rate and Rhythm: Normal rate and regular rhythm.     Heart sounds: Normal heart sounds.  Pulmonary:     Effort: Pulmonary effort is normal. No respiratory distress.  Abdominal:     General: Bowel sounds are normal. There is no distension.     Palpations: Abdomen is soft.  Musculoskeletal:        General: No deformity. Normal range of motion.     Cervical back: Normal range of motion and neck supple.  Skin:    General: Skin is warm.     Coloration: Skin is not jaundiced.     Findings: No erythema.  Neurological:     Mental Status: She is alert and oriented to person, place, and time. Mental status is at baseline.  Psychiatric:        Mood and Affect: Mood normal.      LABORATORY DATA:  I have reviewed the data as listed    Latest Ref Rng & Units 09/26/2023    8:26 AM 09/12/2023    8:45 AM 08/29/2023    8:05 AM  CBC  WBC 4.0 - 10.5 K/uL 4.5  4.9  4.8   Hemoglobin 12.0 - 15.0 g/dL 14.7  82.9  56.2   Hematocrit 36.0 - 46.0 % 34.3  34.6  34.7   Platelets 150 - 400 K/uL 172  163  163       Latest Ref Rng & Units 09/26/2023    8:26 AM 09/12/2023    8:45 AM 08/29/2023    8:05 AM  CMP  Glucose 70 - 99 mg/dL 99  130  865   BUN 8 - 23 mg/dL 13  19  11    Creatinine 0.44 - 1.00 mg/dL 7.84  6.96  2.95   Sodium 135 - 145 mmol/L 141  137  139   Potassium 3.5 - 5.1 mmol/L 3.9  4.0  3.8   Chloride 98 - 111 mmol/L 111  107  106   CO2 22 - 32 mmol/L 24  24  24    Calcium 8.9 - 10.3 mg/dL 8.6  8.4  8.7   Total Protein 6.5 - 8.1 g/dL 6.3  6.2  6.4   Total Bilirubin 0.0 - 1.2 mg/dL 0.7  0.4  0.9   Alkaline Phos 38 - 126 U/L 77  64  74   AST 15 - 41 U/L 32  29  25   ALT 0 - 44 U/L 28  27  22       RADIOGRAPHIC STUDIES: I have personally reviewed the radiological images as listed and agreed with the findings in the report. Reviewed findings of MRI abdomen MRCP done at [facility]. No results found.

## 2023-09-26 NOTE — Progress Notes (Signed)
 Patient here today for follow up regarding pancreatic cancer. Patient reports ongoing neuropathy. Patient denies other concerns.

## 2023-09-26 NOTE — Assessment & Plan Note (Signed)
 S/p  Whipple procedure..  Continue Creon, continue [id number] units TID with meals and  with her snacks

## 2023-09-26 NOTE — Patient Instructions (Signed)
 [facility] - A DEPT OF [facility]  Discharge Instructions: Thank you for choosing [facility] to provide your oncology and hematology care.  If you have a lab appointment with the Cancer Center, please go directly to the Cancer Center and check in at the registration area.  Wear comfortable clothing and clothing appropriate for easy access to any Portacath or PICC line.   We strive to give you quality time with your provider. You may need to reschedule your appointment if you arrive late (15 or more minutes).  Arriving late affects you and other patients whose appointments are after yours.  Also, if you miss three or more appointments without notifying the office, you may be dismissed from the clinic at the provider's discretion.      For prescription refill requests, have your pharmacy contact our office and allow 72 hours for refills to be completed.    Today you received the following chemotherapy and/or immunotherapy agents Abraxane & Gemzar      To help prevent nausea and vomiting after your treatment, we encourage you to take your nausea medication as directed.  BELOW ARE SYMPTOMS THAT SHOULD BE REPORTED IMMEDIATELY: *FEVER GREATER THAN 100.4 F (38 C) OR HIGHER *CHILLS OR SWEATING *NAUSEA AND VOMITING THAT IS NOT CONTROLLED WITH YOUR NAUSEA MEDICATION *UNUSUAL SHORTNESS OF BREATH *UNUSUAL BRUISING OR BLEEDING *URINARY PROBLEMS (pain or burning when urinating, or frequent urination) *BOWEL PROBLEMS (unusual diarrhea, constipation, pain near the anus) TENDERNESS IN MOUTH AND THROAT WITH OR WITHOUT PRESENCE OF ULCERS (sore throat, sores in mouth, or a toothache) UNUSUAL RASH, SWELLING OR PAIN  UNUSUAL VAGINAL DISCHARGE OR ITCHING   Items with * indicate a potential emergency and should be followed up as soon as possible or go to the Emergency Department if any problems should occur.  Please show the CHEMOTHERAPY ALERT CARD or  IMMUNOTHERAPY ALERT CARD at check-in to the Emergency Department and triage nurse.  Should you have questions after your visit or need to cancel or reschedule your appointment, please contact [facility] - A DEPT OF [facility]  [phone] and follow the prompts.  Office hours are 8:00 a.m. to 4:30 p.m. Monday - Friday. Please note that voicemails left after 4:00 p.m. may not be returned until the following business day.  We are closed weekends and major holidays. You have access to a nurse at all times for urgent questions. Please call the main number to the clinic [phone] and follow the prompts.  For any non-urgent questions, you may also contact your provider using MyChart. We now offer e-Visits for anyone 17 and older to request care online for non-urgent symptoms. For details visit [email or web address].   Also download the MyChart app! Go to the app store, search "MyChart", open the app, select [place], and log in with your MyChart username and password.

## 2023-09-26 NOTE — Assessment & Plan Note (Signed)
 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 Continue calcium supplementation, 2000 mg daily-divided in 2-3 doses IV calcium gluconate 2g if calcium <=8.5 Check vitamin D level

## 2023-09-26 NOTE — Assessment & Plan Note (Signed)
 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  Pathology proved liver metastatic disease- additional chemotherapy --> whipple procedure.ypT1b ypN0--> adjuvant Gem/Abraxane finished in August 2023--> Sept 2024 CT showed progression in lung --> Nov 2024 Wedge biopsy- Recurrent pancreatic cancer with lung metastasis.  Currently on Gemcitabine and Abraxane 1 week on 1 week off schedule due to neutropenia.  Labs are reviewed and discussed with patient. Proceed with Gemcitabine and Abraxane.

## 2023-09-26 NOTE — Assessment & Plan Note (Addendum)
 fingertips and toes numbness, grade 2.  She did not tolerate Cymbalta.   Stable symptom Acupuncture sessions as needed.Marland Kitchen

## 2023-09-26 NOTE — Assessment & Plan Note (Signed)
 Chemotherapy plan as listed above

## 2023-09-29 NOTE — Telephone Encounter (Signed)
 [facility] waiting on sample from [facility] to proceed with testing

## 2023-10-03 ENCOUNTER — Ambulatory Visit
Admission: RE | Admit: 2023-10-03 | Discharge: 2023-10-03 | Disposition: A | Discharge status: Home / Self Care | Source: Ambulatory Visit | Attending: Oncology | Admitting: Oncology

## 2023-10-03 DIAGNOSIS — C259 Malignant neoplasm of pancreas, unspecified: Secondary | ICD-10-CM | POA: Insufficient documentation

## 2023-10-03 MED ORDER — IOHEXOL 300 MG/ML  SOLN
100.0000 mL | Freq: Once | INTRAMUSCULAR | Status: AC | PRN
  Administered 2023-10-03: 100 mL via INTRAVENOUS

## 2023-10-07 NOTE — Telephone Encounter (Signed)
 [facility] testing completed. Copy of result sent to med rec and copy given to provider

## 2023-10-10 ENCOUNTER — Telehealth: Payer: Self-pay

## 2023-10-10 ENCOUNTER — Ambulatory Visit

## 2023-10-10 ENCOUNTER — Encounter: Payer: Self-pay | Admitting: Oncology

## 2023-10-10 ENCOUNTER — Inpatient Hospital Stay: Attending: Oncology

## 2023-10-10 ENCOUNTER — Ambulatory Visit: Admitting: Oncology

## 2023-10-10 ENCOUNTER — Inpatient Hospital Stay

## 2023-10-10 ENCOUNTER — Inpatient Hospital Stay (HOSPITAL_BASED_OUTPATIENT_CLINIC_OR_DEPARTMENT_OTHER): Admitting: Oncology

## 2023-10-10 ENCOUNTER — Other Ambulatory Visit

## 2023-10-10 VITALS — BP 120/72 | HR 60 | Temp 98.9°F | Resp 18 | Wt 136.0 lb

## 2023-10-10 VITALS — BP 156/86

## 2023-10-10 DIAGNOSIS — Z5111 Encounter for antineoplastic chemotherapy: Secondary | ICD-10-CM | POA: Insufficient documentation

## 2023-10-10 DIAGNOSIS — G620 Drug-induced polyneuropathy: Secondary | ICD-10-CM

## 2023-10-10 DIAGNOSIS — C787 Secondary malignant neoplasm of liver and intrahepatic bile duct: Secondary | ICD-10-CM | POA: Insufficient documentation

## 2023-10-10 DIAGNOSIS — I712 Thoracic aortic aneurysm, without rupture, unspecified: Secondary | ICD-10-CM | POA: Diagnosis not present

## 2023-10-10 DIAGNOSIS — C259 Malignant neoplasm of pancreas, unspecified: Secondary | ICD-10-CM

## 2023-10-10 DIAGNOSIS — C250 Malignant neoplasm of head of pancreas: Secondary | ICD-10-CM | POA: Diagnosis present

## 2023-10-10 DIAGNOSIS — C7800 Secondary malignant neoplasm of unspecified lung: Secondary | ICD-10-CM | POA: Diagnosis not present

## 2023-10-10 DIAGNOSIS — Z79620 Long term (current) use of immunosuppressive biologic: Secondary | ICD-10-CM | POA: Diagnosis not present

## 2023-10-10 DIAGNOSIS — T451X5A Adverse effect of antineoplastic and immunosuppressive drugs, initial encounter: Secondary | ICD-10-CM

## 2023-10-10 DIAGNOSIS — Z87891 Personal history of nicotine dependence: Secondary | ICD-10-CM | POA: Insufficient documentation

## 2023-10-10 DIAGNOSIS — K8689 Other specified diseases of pancreas: Secondary | ICD-10-CM

## 2023-10-10 LAB — CBC WITH DIFFERENTIAL (CANCER CENTER ONLY)
Abs Immature Granulocytes: 0.03 10*3/uL (ref 0.00–0.07)
Basophils Absolute: 0 10*3/uL (ref 0.0–0.1)
Basophils Relative: 1 %
Eosinophils Absolute: 0.2 10*3/uL (ref 0.0–0.5)
Eosinophils Relative: 3 %
HCT: 35.2 % — ABNORMAL LOW (ref 36.0–46.0)
Hemoglobin: 11.7 g/dL — ABNORMAL LOW (ref 12.0–15.0)
Immature Granulocytes: 1 %
Lymphocytes Relative: 20 %
Lymphs Abs: 1.3 10*3/uL (ref 0.7–4.0)
MCH: 32.8 pg (ref 26.0–34.0)
MCHC: 33.2 g/dL (ref 30.0–36.0)
MCV: 98.6 fL (ref 80.0–100.0)
Monocytes Absolute: 0.7 10*3/uL (ref 0.1–1.0)
Monocytes Relative: 10 %
Neutro Abs: 4.3 10*3/uL (ref 1.7–7.7)
Neutrophils Relative %: 65 %
Platelet Count: 180 10*3/uL (ref 150–400)
RBC: 3.57 MIL/uL — ABNORMAL LOW (ref 3.87–5.11)
RDW: 14 % (ref 11.5–15.5)
WBC Count: 6.5 10*3/uL (ref 4.0–10.5)
nRBC: 0 % (ref 0.0–0.2)

## 2023-10-10 LAB — CMP (CANCER CENTER ONLY)
ALT: 25 U/L (ref 0–44)
AST: 28 U/L (ref 15–41)
Albumin: 4.1 g/dL (ref 3.5–5.0)
Alkaline Phosphatase: 83 U/L (ref 38–126)
Anion gap: 9 (ref 5–15)
BUN: 17 mg/dL (ref 8–23)
CO2: 23 mmol/L (ref 22–32)
Calcium: 8.8 mg/dL — ABNORMAL LOW (ref 8.9–10.3)
Chloride: 106 mmol/L (ref 98–111)
Creatinine: 0.5 mg/dL (ref 0.44–1.00)
GFR, Estimated: >60 mL/min (ref >60)
Glucose, Bld: 165 mg/dL — ABNORMAL HIGH (ref 70–99)
Potassium: 3.7 mmol/L (ref 3.5–5.1)
Sodium: 138 mmol/L (ref 135–145)
Total Bilirubin: 0.6 mg/dL (ref 0.0–1.2)
Total Protein: 6.6 g/dL (ref 6.5–8.1)

## 2023-10-10 MED ORDER — SODIUM CHLORIDE 0.9 % IV SOLN: 2.0000 g | INTRAVENOUS | Status: DC | PRN

## 2023-10-10 MED ORDER — SODIUM CHLORIDE 0.9 % IV SOLN
INTRAVENOUS | Status: DC
  Filled 2023-10-10: qty 250

## 2023-10-10 MED ORDER — PACLITAXEL PROTEIN-BOUND CHEMO INJECTION 100 MG
100.0000 mg/m2 | Freq: Once | INTRAVENOUS | Status: AC
  Administered 2023-10-10: 175 mg via INTRAVENOUS
  Filled 2023-10-10: qty 35

## 2023-10-10 MED ORDER — HEPARIN SOD (PORK) LOCK FLUSH 100 UNIT/ML IV SOLN
500.0000 [IU] | Freq: Once | INTRAVENOUS | Status: AC | PRN
  Administered 2023-10-10: 500 [IU]
  Filled 2023-10-10: qty 5

## 2023-10-10 MED ORDER — SODIUM CHLORIDE 0.9 % IV SOLN
1000.0000 mg/m2 | Freq: Once | INTRAVENOUS | Status: AC
  Administered 2023-10-10: 1710 mg via INTRAVENOUS
  Filled 2023-10-10: qty 44.97

## 2023-10-10 NOTE — Progress Notes (Signed)
 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  She did not tolerate Cymbalta.   Stable symptom Acupuncture sessions as needed  Thoracic aortic aneurysm [facility]) Refer to vascular surgeon .  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  Pathology proved liver metastatic disease- additional chemotherapy --> whipple procedure.ypT1b ypN0--> adjuvant Gem/Abraxane finished in August 2023--> Sept 2024 CT showed progression in lung --> Nov 2024 Wedge biopsy- Recurrent pancreatic cancer with lung metastasis.  Currently on Gemcitabine and Abraxane 1 week on 1 week off schedule due to neutropenia.  Labs are reviewed and discussed with patient.  Proceed with Gemcitabine and Abraxane.  Recent CT chest abdomen pelvis restaging showed mild improvement.  Continue current regimen.   Encounter for antineoplastic chemotherapy Chemotherapy plan as listed above  Hypocalcemia Likely due to malabsorption. Continue calcium supplementation, 2000 mg daily-divided in 2-3 doses IV calcium gluconate 2g if calcium <=8.5 Normal vitamin D level   Pancreatic insufficiency S/p  Whipple procedure..  Continue Creon, continue 78469 units TID with meals and  with her snacks  Orders Placed This Encounter  Procedures   Cancer antigen 19-9    Standing Status:   Future    Expected Date:   11/07/2023    Expiration Date:   11/06/2024   CEA    Standing Status:   Future     Expected Date:   11/07/2023    Expiration Date:   11/06/2024   CBC with Differential ([name] Only)    Standing Status:   Future    Expected Date:   11/07/2023    Expiration Date:   11/06/2024   CMP ([name] only)    Standing Status:   Future    Expected Date:   11/07/2023    Expiration Date:   11/06/2024   CBC with Differential ([name] Only)    Standing Status:   Future    Expected Date:   11/21/2023    Expiration Date:   11/20/2024   CMP ([name] only)    Standing Status:   Future    Expected Date:   11/21/2023    Expiration Date:   11/20/2024   Ambulatory referral to Vascular Surgery    Referral Priority:   Routine    Referral Type:   Surgical    Referral Reason:   Specialty Services Required    Requested Specialty:   Vascular Surgery    Number of Visits Requested:   1    Follow up  2 weeks lab MD Gemcitaibine Abraxane    All questions were answered. The patient knows to call the clinic with any problems, questions or concerns.  Cassandra Patience, MD, PhD [name] Oncology 10/10/2023   Cassandra Patience, MD    HISTORY OF PRESENTING ILLNESS:   [name] is a  68 y.o.  female with presents for follow up of Stage IV pancreatic adenocarcinoma Patient initially presented with jaundice, transaminitis, bilirubin was 9.9.  CA 19-9 was 1874.  Patient also reports unintentional weight loss. 02/08/2020 MRI abdomen and MRCP with and  without contrast was done at [name] which showed pancreatic head mass measuring up to 3 cm, with marked associated narrowing of the portal vein confluence.  SMA is preserved.  Marked intrahepatic and extrahepatic biliary duct dilatation as well as mild dilatation of the main pancreatic duct. Multiple hepatic masses highly concerning for metastatic disease.  Patient underwent EUS on 02/19/2020, which showed irregular mass identified in the pancreatic head, hypoechoic, measured 29mmx33mm, sonographic evidence concerning for invasion into the  superior mesenteric artery.  There is no sign of significant abnormality in the main pancreatic duct.  Dilatation of common bile duct which measured up to 16 mm.  Region of celiac artery was visualized and showed no signs of significant abnormality.  No lymphadenopathy.  FNA showed adenocarcinoma.  02/19/2020, ERCP, malignant.  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  The stricture was treated with placement of wall flex metal stent.  Patient was seen by [name] Dr. [name] and was recommended for 3 drug regimen FOLFIRINOX.  Patient prefers to do chemotherapy locally at [facility].  Patient was referred to establish care today. She denies any pain.  Since stent placement, skin jaundice has improved.  Itchiness has also improved. Patient was accompanied by her husband today.  She has a family history of breast cancer in sister and paternal aunt, colon cancer paternal grandmother.  #No reportable targetable mutation on NGS 9/14/2021cycle 1 FOLFIRINOX.  Patient received oxaliplatin and about 50% of Irinotecan on day 1 and had experienced neurologic symptoms.  She went to ER and working diagnosis is TIA, and eventually I think this is due to irinotecan side effects -irinotecan-associated dysarthria, lip/tongue numbness.  Adjustment was made for Irinotecan to be  infused over 180 minutes.  Atropine 0.5 mg once prior to the irinotecan. No recurrent symptoms.   # 03/19/2020-03/21/2020 patient was admitted due to sepsis with strep pneumonia bacteremia.  Patient was treated with IV Rocephin.  TEE was done which showed no vegetation.  No PFO or ASD.  Patient was discharged home and he finished full course of 14 days of IV Rocephin on 04/02/2020 per ID recommendation.  Repeat blood culture was also negative.  08/25/20 cycle 10 FOLFIRINOX 09/08/20- present  Starting cycle 11, FOLFIRI, Oxaliplatin discontinued due to neuropathy   #NGS showed no  reportable targetable mutation #Genetic testing-[employer] diagnostic testing showed no pathological variants identified. MS stable, TMB 53mut/mb, KRAS G12D, SF3B1 K700E, TP53 V247fs*30  #01/14/2021  CT done at [facility] during the interval, stable disease.  # Her case was presented by Dr.Jia from [facility] tumor board. Susann Eon were 4~5 liver lesions on OSH MRI last year at the time of diagnosis highly suspicious for metastasis. She is not eligible for surgical protocol for metastasis resection given the number of liver metastasis is >3. However given her excellent and durable response to chemo, surgical resection may be considered pending sustained disease control at 1 year and may require partial hepatectomy first to see if there is viable tumor before proceeding to Whipple resection ]  Patient had a COVID-19 infection in September 2022.  03/23/2021 CT abdomen pelvis No significant change in the ill-defined pancreatic head mass or  associated biliary ductal and pancreatic ductal dilation.  Slight interval enlargement of subcentimeter anterior peripancreatic  lymph node, nonspecific. Attention on follow-up per clinical protocol. Similar enlargement of the ascending thoracic aorta measuring 4.4 cm  03/23/2021 MRI abdomen w and wo  Increasing ill-defined hypoenhancement of the pancreatic head  surrounding the common bile duct stent, in keeping with known pancreatic malignancy. Anterior peripancreatic lymph node is better evaluated on CT.  Unchanged position of a common bile duct stent with similar mild diffuse intrahepatic ductal dilation and pneumobilia. No evidence of metastatic disease in the abdomen or pelvis. Partially visualized cystic left adnexal lesion measuring up to 3.9 cm.   04/10/2021, patient underwent liver resection at [facility], by Dr. [name] Ducking.   Pathology report from [facility] was reviewed. Segments 3 partial hepatectomy, negative for viable tumor. Section 5/6 partial hepatectomy part 1 and 2, microscopic foci  of residual viable adenocarcinoma, morphologically consistent with history of pancreatic primary.  Parenchymal margin is uninvolved.  #Patient resumed on FOLFIRI on 04/28/2021. She got another cycle on 05/12/2021 #05/26/2021, patient did not get additional chemotherapy due to transaminitis.  Shared decision was made to stop FOLFIRI and switch to gemcitabine Abraxane treatments.  05/26/2021, patient developed transaminitis, AST 763, ALT 691, alkaline phosphatase 364.  Bilirubin 0.7 05/26/2021 stat ultrasound abdomen right upper quadrant showed interval development of 3.3 x 1.8 cm complex mass in the right hepatic lobe.  Increased extrahepatic ductal dilatation.  Concerning for CBD obstruction. 05/29/2021, MRI abdomen MRCP with and without contrast showed unchanged pancreatic head soft tissue.  Severe intra and extrahepatic biliary ductal dilatation.  Common bile duct stents remain in position however patency is not established.  No evidence of lymphadenopathy or metastatic disease in the abdomen.  With further communication with radiologist, addendum was added that there is internal fluid signal and no appreciable associated contrast enhancement measuring 3.4 x 2.3 cm.  This appearance is generally not consistent with metastasis and is of uncertain significance.  Possibly reflecting hepatic abscess or residual of subcapsular hematoma.  Patient was seen by [facility] Dr. Elon Jester team.  He had ERCP done on 06/05/2021, with findings of One stent from the biliary tree was seen in the major papilla. The stent had migrated significantly into the duodenum. This is the cause of stent malfunction. One stent was removed from the biliary tree. Prior biliary sphincterotomy appeared open. - A single severe biliary stricture was found in the lower third of the main bile duct with upstream dilation. The stricture was alignant appearing.- The biliary tree was swept and sludge was found.- One fully covered metal stent was placed into  the common bile duct across the stricture.- No pancreatogram performed.  06/12/2021-08/10/2021 gemcitabine and Abraxane.   10/14/2021, patient underwent Whipple procedure. Liver biopsy negative for malignancy. Review procedure showed invasive adenocarcinoma, moderately differentiated, centered in pancreatic head and confined in the Pancreas. Surgical margin is negative for malignancy.  36 lymph nodes were all negative for malignancy.  1 hepatic artery lymph node was harvested and was negative.  Gallbladder negative for malignancy.  Cystic duct excision negative for malignancy. pT1b pN0  10/25/2021, CT abdomen pelvis with contrast showed rim-enhancing fluid collection in the region of the hepatic hilum. Patient had a readmission for superficial wound infection.  She completed antibiotics on 11/03/2021.  11/03/2021, CT abdomen pelvis with contrast showed near resolution of previously seen fluid collection in the region of the hepatic hilum.  Similar appearance of the area of hypoenhancement in the liver.  Attention on follow-up.  11/17/2021, resumed on gemcitabine/Abraxane. 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  12/12/ 2023 CT scan at [name] shows no evidence of cancer recurrence.-NED 09/21/2022 CT chest abdomen pelvis w contrast at [facility] showed-No evidence of metastatic disease in the chest, abdomen, or pelvis. 12/21/2022 CT chest abdomen pelvis  w contrast at [place] showed-No evidence of metastatic disease in the chest, abdomen, or pelvis.   03/22/2023 CT scan at [facility] showed enlarging lung nodules. RLL 1cm, LLL 0.5cm, RUL 0.5cm Stable hypoattenuating right inferior hepatic lobe lesion measuring 0.9 cm.  Similar left adnexal cystic lesion measuring up to 5.0 cm.   04/06/2023 s/p lung nodule biopsy.  Rare atypical epithelioid cells present with evidence suggestive of invasion, suspicious but not diagnostic for malignancy.   05/20/2023: Right robot assisted thoracoscopic wedge resection with MLND  (Dr. Sandford Croon)   Pathology:  Suspicious for malignancy but not diagnostic A. Lymph node, level 8, biopsy: Metastatic carcinoma involving one lymph node (1/1).  B. Lung, right lower lobe, wedge resection: Lung tissue positive for adenocarcinoma, consistent with metastasis from pancreas primary. See comment. Tumor size: 1.0 cm. Operative margin: Negative  Comment: Note is made of the patient's history of invasive adenocarcinoma of the pancreatic head ([id number]; 10/14/2021), which is compared to the current case. Comparison is challenging given the post-treatment nature of the pancreatic tumor; however, there are some morphologic similarities.  Although often unhelpful in the setting of lung primary versus pancreatic primary, immunohistochemical stains were performed. Tumor cells demonstrate the below immunoprofile which would could support pulmonary adenocarcinoma with enteric differentiation but would also be consistent with metastasis from the patient's known pancreatic primary; the latter is favored.              Positive                             Negative CK7 (strong, diffuse) TTF-1  CK20 (strong, focal)                    Positive and negative controls perform appropriately.    C. Lymph node, level 7, biopsy: Metastatic carcinoma involving one lymph node (1/1).  D. Lymph node, level 4R, biopsy: One lymph node, negative for malignancy (0/1).   INTERVAL HISTORY [name] is a 68 y.o. female who has above history reviewed by me today presents for stage IV pancreatic cancer.   She tolerates Gemcitabine Abraxane 1 week on and 1 week off   She feels tired after chemotherapy. 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  She gets acupuncture session which helped her symptoms.   She takes calcium supplementation as instructed.  She take Creon for pancreatic insufficiency.    Review of Systems  Constitutional:  Positive for fatigue.  Negative for appetite change, chills, fever and unexpected weight change.  HENT:   Negative for hearing loss and voice change.   Eyes:  Negative for eye problems.  Respiratory:  Negative for chest tightness and cough.   Cardiovascular:  Negative for chest pain.  Gastrointestinal:  Negative for abdominal distention, abdominal pain, blood in stool and nausea.  Endocrine: Negative for hot flashes.  Genitourinary:  Negative for difficulty urinating and frequency.   Musculoskeletal:  Negative for arthralgias.       Leg cramps  Skin:  Negative for itching and rash.  Neurological:  Positive for numbness. Negative for extremity weakness.  Hematological:  Negative for adenopathy.  Psychiatric/Behavioral:  Negative for confusion.     MEDICAL HISTORY:  Past Medical History:  Diagnosis Date   Allergy    Anemia    Bacteremia due to Streptococcus pneumoniae 03/19/2020   Cancer [facility])    pancreatic cancer   Colon polyps  Family history of breast cancer    Neuropathy due to drug [name])    chemo induced   Neutropenia (HCC) 04/18/2020   Osteopenia after menopause 05/2017   femoral neck T score -2.0   Personal history of chemotherapy    current for pancreatic ca   PONV (postoperative nausea and vomiting)    Pure hypercholesterolemia    Sepsis (HCC) 03/19/2020    SURGICAL HISTORY: Past Surgical History:  Procedure Laterality Date   COLONOSCOPY  07/2015   WNL   COLONOSCOPY  2008/2011   COLONOSCOPY WITH PROPOFOL N/A 07/10/2021   Procedure: COLONOSCOPY WITH PROPOFOL;  Surgeon: Regis Bill, MD;  Location: [facility] ENDOSCOPY;  Service: Endoscopy;  Laterality: N/A;   IR CV LINE INJECTION  04/28/2021   OVARIAN CYST REMOVAL  1992   dermoid-Dr CAK   PORTA CATH INSERTION N/A 03/07/2020   Procedure: PORTA CATH INSERTION;  Surgeon: Annice Needy, MD;  Location: [facility] INVASIVE CV LAB;  Service: Cardiovascular;  Laterality: N/A;   TEE WITHOUT CARDIOVERSION N/A 03/21/2020   Procedure:  TRANSESOPHAGEAL ECHOCARDIOGRAM (TEE);  Surgeon: Laurier Nancy, MD;  Location: [facility] ORS;  Service: Cardiovascular;  Laterality: N/A;   TUBAL LIGATION  1993    SOCIAL HISTORY: Social History   Socioeconomic History   Marital status: Married    Spouse name: Not on file   Number of children: 2   Years of education: Not on file   Highest education level: Not on file  Occupational History   Occupation: Teacher    Comment: retired   Occupation: Visual merchandiser  Tobacco Use   Smoking status: Former    Current packs/day: 0.00    Average packs/day: 1 pack/day for 10.0 years (10.0 ttl pk-yrs)    Types: Cigarettes    Start date: 06/29/1975    Quit date: 06/28/1985    Years since quitting: 38.3   Smokeless tobacco: Never   Tobacco comments:    Quit smoking 1987  Vaping Use   Vaping status: Never Used  Substance and Sexual Activity   Alcohol use: Yes    Alcohol/week: 5.0 standard drinks of alcohol    Types: 5 Glasses of wine per week    Comment: 0-2 mixed drinks a day   Drug use: No   Sexual activity: Not Currently    Birth control/protection: Post-menopausal  Other Topics Concern   Not on file  Social History Narrative   Not on file   Social Drivers of Health   Financial Resource Strain: Low Risk  (05/20/2023)   Received from [facility]   Overall Financial Resource Strain (CARDIA)    Difficulty of Paying Living Expenses: Not hard at all  Food Insecurity: No Food Insecurity (05/24/2023)   Hunger Vital Sign    Worried About Running Out of Food in the Last Year: Never true    Ran Out of Food in the Last Year: Never true  Transportation Needs: No Transportation Needs (05/24/2023)   PRAPARE - Administrator, Civil Service (Medical): No    Lack of Transportation (Non-Medical): No  Physical Activity: Insufficiently Active (10/09/2022)   Exercise Vital Sign    Days of Exercise per Week: 4 days    Minutes of Exercise per Session: 30 min  Stress: No Stress  Concern Present (10/09/2022)   [employer] of Occupational Health - Occupational Stress Questionnaire    Feeling of Stress : Not at all  Social Connections: Unknown (10/09/2022)   Social Connection and Isolation Panel [NHANES]  Frequency of Communication with Friends and Family: More than three times a week    Frequency of Social Gatherings with Friends and Family: More than three times a week    Attends Religious Services: Not on Marketing executive or Organizations: No    Attends Banker Meetings: Never    Marital Status: Married  Catering manager Violence: Not At Risk (05/24/2023)   Humiliation, Afraid, Rape, and Kick questionnaire    Fear of Current or Ex-Partner: No    Emotionally Abused: No    Physically Abused: No    Sexually Abused: No    FAMILY HISTORY: Family History  Problem Relation Age of Onset   Breast cancer Paternal Aunt 76   Diabetes Mother    Osteoporosis Mother    Hyperlipidemia Father    Rheumatic fever Father    Valvular heart disease Father    Cancer Paternal Grandmother        possible colon   Breast cancer Sister 51    ALLERGIES:  is allergic to penicillin g.  MEDICATIONS:  Current Outpatient Medications  Medication Sig Dispense Refill   aspirin 81 MG chewable tablet Chew 81 mg by mouth daily.     b complex vitamins capsule Take 1 capsule by mouth daily.     Calcium Carbonate-Vit D-Min (CALCIUM 1200 PO) Take by mouth.     Cholecalciferol 25 MCG (1000 UT) tablet Take 2,000 Units by mouth daily.     clobetasol cream (TEMOVATE) 0.05 %      fluticasone (FLONASE) 50 MCG/ACT nasal spray Place 1 spray into both nostrils daily.     lidocaine-prilocaine (EMLA) cream Apply small amount to port and cover with saran wrap 1-2 hours prior to port access 30 g 6   loratadine (CLARITIN REDITABS) 10 MG dissolvable tablet Take 10 mg by mouth daily.     Melatonin 2.5 MG CHEW Chew by mouth.     Multiple Vitamins-Minerals (MULTIVITAMIN  WITH MINERALS) tablet Take 1 tablet by mouth daily.     Pancrelipase, Lip-Prot-Amyl, (CREON) 24000-76000 units CPEP TAKE 1 CAPSULE THREE TIMES DAILY BEFORE MEALS 200 capsule 3   senna-docusate (SENOKOT-S) 8.6-50 MG tablet Take by mouth.     simethicone (GAS-X) 80 MG chewable tablet Chew 1 tablet (80 mg total) by mouth every 8 (eight) hours as needed for flatulence. 60 tablet 0   No current facility-administered medications for this visit.   Facility-Administered Medications Ordered in Other Visits  Medication Dose Route Frequency Provider Last Rate Last Admin   0.9 %  sodium chloride infusion   Intravenous Continuous [name], MD 10 mL/hr at 09/12/23 1241 Infusion Verify at 09/12/23 1241   0.9 %  sodium chloride infusion   Intravenous Continuous [name], MD   Stopped at 10/10/23 1541   calcium gluconate 2 g in sodium chloride 0.9 % 100 mL IVPB  2 g Intravenous PRN [name], MD   Stopped at 09/12/23 1101   heparin lock flush 100 UNIT/ML injection            prochlorperazine (COMPAZINE) 10 MG tablet            sodium chloride flush (NS) 0.9 % injection 10 mL  10 mL Intravenous PRN [name], MD   10 mL at 02/18/21 0858   sodium chloride flush (NS) 0.9 % injection 10 mL  10 mL Intravenous Once Borders, Joshua R, NP       sodium chloride flush (NS) 0.9 %  injection 10 mL  10 mL Intravenous Once [facility], MD         PHYSICAL EXAMINATION: ECOG PERFORMANCE STATUS: 0 - Asymptomatic Vitals:   10/10/23 1304  BP: 120/72  Pulse: 60  Resp: 18  Temp: 98.9 F (37.2 C)   Filed Weights   10/10/23 1304  Weight: 136 lb (61.7 kg)     Physical Exam Constitutional:      General: She is not in acute distress. HENT:     Head: Normocephalic and atraumatic.  Eyes:     General: No scleral icterus. Cardiovascular:     Rate and Rhythm: Normal rate and regular rhythm.     Heart sounds: Normal heart sounds.  Pulmonary:     Effort: Pulmonary effort is normal. No respiratory distress.  Abdominal:      General: Bowel sounds are normal. There is no distension.     Palpations: Abdomen is soft.  Musculoskeletal:        General: No deformity. Normal range of motion.     Cervical back: Normal range of motion and neck supple.  Skin:    General: Skin is warm.     Coloration: Skin is not jaundiced.     Findings: No erythema.  Neurological:     Mental Status: She is alert and oriented to person, place, and time. Mental status is at baseline.  Psychiatric:        Mood and Affect: Mood normal.     LABORATORY DATA:  I have reviewed the data as listed    Latest Ref Rng & Units 10/10/2023   12:45 PM 09/26/2023    8:26 AM 09/12/2023    8:45 AM  CBC  WBC 4.0 - 10.5 K/uL 6.5  4.5  4.9   Hemoglobin 12.0 - 15.0 g/dL 54.0  98.1  19.1   Hematocrit 36.0 - 46.0 % 35.2  34.3  34.6   Platelets 150 - 400 K/uL 180  172  163       Latest Ref Rng & Units 10/10/2023   12:45 PM 09/26/2023    8:26 AM 09/12/2023    8:45 AM  CMP  Glucose 70 - 99 mg/dL 478  99  295   BUN 8 - 23 mg/dL 17  13  19    Creatinine 0.44 - 1.00 mg/dL 6.21  3.08  6.57   Sodium 135 - 145 mmol/L 138  141  137   Potassium 3.5 - 5.1 mmol/L 3.7  3.9  4.0   Chloride 98 - 111 mmol/L 106  111  107   CO2 22 - 32 mmol/L 23  24  24    Calcium 8.9 - 10.3 mg/dL 8.8  8.6  8.4   Total Protein 6.5 - 8.1 g/dL 6.6  6.3  6.2   Total Bilirubin 0.0 - 1.2 mg/dL 0.6  0.7  0.4   Alkaline Phos 38 - 126 U/L 83  77  64   AST 15 - 41 U/L 28  32  29   ALT 0 - 44 U/L 25  28  27       RADIOGRAPHIC STUDIES: I have personally reviewed the radiological images as listed and agreed with the findings in the report. Reviewed findings of MRI abdomen MRCP done at [name] CT CHEST ABDOMEN PELVIS W CONTRAST Result Date: 10/09/2023 CLINICAL DATA:  Pancreatic cancer restaging, ongoing chemotherapy status post Whipple procedure * Tracking Code: BO * EXAM: CT CHEST, ABDOMEN, AND PELVIS WITH CONTRAST TECHNIQUE: Multidetector CT imaging of the chest,  abdomen and pelvis was  performed following the standard protocol during bolus administration of intravenous contrast. RADIATION DOSE REDUCTION: This exam was performed according to the departmental dose-optimization program which includes automated exposure control, adjustment of the mA and/or kV according to patient size and/or use of iterative reconstruction technique. CONTRAST:  OMNIPAQUE IOHEXOL 300 MG/ML  SOLN COMPARISON:  06/27/2023 FINDINGS: CT CHEST FINDINGS Cardiovascular: Unchanged enlargement of the tubular ascending thoracic aorta measuring up to 4.5 x 4.4 cm (series 2, image 31). Scattered aortic atherosclerosis normal heart size. No pericardial effusion. Mediastinum/Nodes: No enlarged mediastinal, hilar, or axillary lymph nodes. Thyroid gland, trachea, and esophagus demonstrate no significant findings. Lungs/Pleura: Bandlike scarring of the right lung base, unchanged. No significant change in an irregular nodule of the dependent left upper lobe measuring 0.6 cm (series 4, image 41). No significant change in adjacent nodules of the dependent left lower lobe, a cavitary nodule measuring 0.9 x 0.7 cm (series 4, image 77) and an adjacent solid nodule measuring 0.5 cm (series 4, image 72). Unchanged small nodule of the anterior inferior right upper lobe measuring 0.5 cm (series 4, image 71). No new nodules. No pleural effusion or pneumothorax. Musculoskeletal: No chest wall abnormality. No acute osseous findings. CT ABDOMEN PELVIS FINDINGS Hepatobiliary: Unchanged, very subtle hypodense lesion of the inferior margin of hepatic segment V adjacent to the gallbladder fossa measuring 0.8 cm (series 2, image 70). Status post cholecystectomy and hepatojejunostomy. No biliary dilatation. Pancreas: Status post Whipple pancreaticoduodenectomy. Diminished size and solid character of soft tissue at the right aspect of the central superior mesenteric vein, on today's examination measuring 1.0 x 0.8 cm, previously 1.3 x 1.1 cm (series  2, image 75). No pancreatic ductal dilatation or surrounding inflammatory changes. Spleen: Normal in size without significant abnormality. Adrenals/Urinary Tract: Adrenal glands are unremarkable. Kidneys are normal, without renal calculi, solid lesion, or hydronephrosis. Bladder is unremarkable. Stomach/Bowel: Status post distal gastrectomy and gastrojejunostomy. Appendix appears normal. No evidence of bowel wall thickening, distention, or inflammatory changes. Vascular/Lymphatic: Aortic atherosclerosis. No enlarged abdominal or pelvic lymph nodes. Reproductive: No mass. Unchanged left ovarian cyst measuring 5.1 x 3.1 cm (series 2, image 102). Other: No abdominal wall hernia or abnormality. No ascites. Musculoskeletal: No acute osseous findings. IMPRESSION: 1. Status post Whipple pancreaticoduodenectomy. Diminished size and solid character of soft tissue at the right aspect of the central superior mesenteric vein, on today's examination measuring 1.0 x 0.8 cm, previously 1.3 x 1.1 cm which remains highly suspicious for local recurrence. 2. Unchanged, very subtle hypodense lesion of the inferior margin of hepatic segment V adjacent to the gallbladder fossa measuring 0.8 cm. Continued attention on follow-up. 3. No significant change in small bilateral pulmonary nodules, highly suspicious for small pulmonary metastases. 4. Unchanged left ovarian cyst measuring 5.1 x 3.1 cm. Although abnormal in the late postmenopausal setting, this is probably benign and attention on follow-up is sufficient in the setting of known malignancy. 5. Unchanged enlargement of the tubular ascending thoracic aorta measuring up to 4.5 x 4.4 cm. Ascending thoracic aortic aneurysm. Attention on follow-up in the setting of known primary malignancy. Aortic Atherosclerosis (ICD10-I70.0). Electronically Signed   By: Fredricka Jenny M.D.   On: 10/09/2023 16:57

## 2023-10-10 NOTE — Assessment & Plan Note (Signed)
Refer to vascular surgeon.

## 2023-10-10 NOTE — Telephone Encounter (Signed)
 Referral faxed to [facility] for Thoracic aneurysm

## 2023-10-10 NOTE — Assessment & Plan Note (Addendum)
 Mostly bothering her toes, grade 2.  She did not tolerate Cymbalta.   Stable symptom Acupuncture sessions as needed

## 2023-10-10 NOTE — Assessment & Plan Note (Signed)
 S/p  Whipple procedure..  Continue Creon, continue [id number] units TID with meals and  with her snacks

## 2023-10-10 NOTE — Assessment & Plan Note (Signed)
 Chemotherapy plan as listed above

## 2023-10-10 NOTE — Assessment & Plan Note (Signed)
 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 Continue calcium supplementation, 2000 mg daily-divided in 2-3 doses IV calcium gluconate 2g if calcium <=8.5 Normal vitamin D level

## 2023-10-10 NOTE — Patient Instructions (Signed)
 [facility] - A DEPT OF [facility]  Discharge Instructions: Thank you for choosing [facility] to provide your oncology and hematology care.  If you have a lab appointment with the Cancer Center, please go directly to the Cancer Center and check in at the registration area.  Wear comfortable clothing and clothing appropriate for easy access to any Portacath or PICC line.   We strive to give you quality time with your provider. You may need to reschedule your appointment if you arrive late (15 or more minutes).  Arriving late affects you and other patients whose appointments are after yours.  Also, if you miss three or more appointments without notifying the office, you may be dismissed from the clinic at the provider's discretion.      For prescription refill requests, have your pharmacy contact our office and allow 72 hours for refills to be completed.    Today you received the following chemotherapy and/or immunotherapy agents GEMZAR and ABRAXENE       To help prevent nausea and vomiting after your treatment, we encourage you to take your nausea medication as directed.  BELOW ARE SYMPTOMS THAT SHOULD BE REPORTED IMMEDIATELY: *FEVER GREATER THAN 100.4 F (38 C) OR HIGHER *CHILLS OR SWEATING *NAUSEA AND VOMITING THAT IS NOT CONTROLLED WITH YOUR NAUSEA MEDICATION *UNUSUAL SHORTNESS OF BREATH *UNUSUAL BRUISING OR BLEEDING *URINARY PROBLEMS (pain or burning when urinating, or frequent urination) *BOWEL PROBLEMS (unusual diarrhea, constipation, pain near the anus) TENDERNESS IN MOUTH AND THROAT WITH OR WITHOUT PRESENCE OF ULCERS (sore throat, sores in mouth, or a toothache) UNUSUAL RASH, SWELLING OR PAIN  UNUSUAL VAGINAL DISCHARGE OR ITCHING   Items with * indicate a potential emergency and should be followed up as soon as possible or go to the Emergency Department if any problems should occur.  Please show the CHEMOTHERAPY ALERT CARD or  IMMUNOTHERAPY ALERT CARD at check-in to the Emergency Department and triage nurse.  Should you have questions after your visit or need to cancel or reschedule your appointment, please contact [facility] - A DEPT OF [facility]  [phone] and follow the prompts.  Office hours are 8:00 a.m. to 4:30 p.m. Monday - Friday. Please note that voicemails left after 4:00 p.m. may not be returned until the following business day.  We are closed weekends and major holidays. You have access to a nurse at all times for urgent questions. Please call the main number to the clinic [phone] and follow the prompts.  For any non-urgent questions, you may also contact your provider using MyChart. We now offer e-Visits for anyone 86 and older to request care online for non-urgent symptoms. For details visit [email or web address].   Also download the MyChart app! Go to the app store, search "MyChart", open the app, select [facility], and log in with your MyChart username and password.

## 2023-10-10 NOTE — Assessment & Plan Note (Signed)
 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  Pathology proved liver metastatic disease- additional chemotherapy --> whipple procedure.ypT1b [id number]--> adjuvant Gem/Abraxane finished in August 2023--> Sept 2024 CT showed progression in lung --> Nov 2024 Wedge biopsy- Recurrent pancreatic cancer with lung metastasis.  Currently on Gemcitabine and Abraxane 1 week on 1 week off schedule due to neutropenia.  Labs are reviewed and discussed with patient.  Proceed with Gemcitabine and Abraxane.  Recent CT chest abdomen pelvis restaging showed mild improvement.  Continue current regimen.

## 2023-10-11 LAB — CANCER ANTIGEN 19-9: CA 19-9: 7 U/mL (ref 0–35)

## 2023-10-11 LAB — CEA: CEA: 6.4 ng/mL — ABNORMAL HIGH (ref 0.0–4.7)

## 2023-10-18 ENCOUNTER — Ambulatory Visit: Payer: Medicare Other

## 2023-10-18 ENCOUNTER — Ambulatory Visit (INDEPENDENT_AMBULATORY_CARE_PROVIDER_SITE_OTHER): Payer: Self-pay | Admitting: Vascular Surgery

## 2023-10-18 ENCOUNTER — Encounter (INDEPENDENT_AMBULATORY_CARE_PROVIDER_SITE_OTHER): Payer: Self-pay | Admitting: Vascular Surgery

## 2023-10-18 VITALS — BP 133/80 | HR 65 | Resp 18 | Ht 67.0 in | Wt 136.2 lb

## 2023-10-18 DIAGNOSIS — Z0000 Encounter for general adult medical examination without abnormal findings: Secondary | ICD-10-CM | POA: Diagnosis not present

## 2023-10-18 DIAGNOSIS — C259 Malignant neoplasm of pancreas, unspecified: Secondary | ICD-10-CM | POA: Diagnosis not present

## 2023-10-18 DIAGNOSIS — Z95828 Presence of other vascular implants and grafts: Secondary | ICD-10-CM | POA: Diagnosis not present

## 2023-10-18 DIAGNOSIS — I7121 Aneurysm of the ascending aorta, without rupture: Secondary | ICD-10-CM | POA: Diagnosis not present

## 2023-10-18 NOTE — Assessment & Plan Note (Signed)
 Follows with oncology.  Gets regular CT scans for evaluation.

## 2023-10-18 NOTE — Patient Instructions (Addendum)
 [name] , Thank you for taking time to come for your Medicare Wellness Visit. I appreciate your ongoing commitment to your health goals. Please review the following plan we discussed and let me know if I can assist you in the future.   Referrals/Orders/Follow-Ups/Clinician Recommendations: NONE  This is a list of the screening recommended for you and due dates:  Health Maintenance  Topic Date Due   COVID-19 Vaccine (5 - 2024-25 season) 02/27/2023   Zoster (Shingles) Vaccine (2 of 2) 04/13/2023   Mammogram  01/14/2024   Flu Shot  01/27/2024   Medicare Annual Wellness Visit  10/17/2024   DTaP/Tdap/Td vaccine (3 - Td or Tdap) 05/13/2027   Colon Cancer Screening  07/11/2031   Pneumonia Vaccine  Completed   DEXA scan (bone density measurement)  Completed   Hepatitis C Screening  Completed   HPV Vaccine  Aged Out   Meningitis B Vaccine  Aged Out    Advanced directives: (Copy Requested) Please bring a copy of your health care power of attorney and living will to the office to be added to your chart at your convenience. You can mail to [facility] [address] [place], [place] [address] or email to [email or web address]  Next Medicare Annual Wellness Visit scheduled for next year: Yes  10/23/24 @ 11:30 AM BY PHONE

## 2023-10-18 NOTE — Progress Notes (Signed)
 Subjective:   Cassandra [name] is a 68 y.o. who presents for a Medicare Wellness preventive visit.  Visit Complete: Virtual I connected with  Cassandra [name] on 10/18/23 by a audio enabled telemedicine application and verified that I am speaking with the correct person using two identifiers.  Patient Location: Home  Provider Location: Office/Clinic  I discussed the limitations of evaluation and management by telemedicine. The patient expressed understanding and agreed to proceed.  Vital Signs: Because this visit was a virtual/telehealth visit, some criteria may be missing or patient reported. Any vitals not documented were not able to be obtained and vitals that have been documented are patient reported.  VideoDeclined- This patient declined Librarian, academic. Therefore the visit was completed with audio only.  Persons Participating in Visit: Patient.  AWV Questionnaire: No: Patient Medicare AWV questionnaire was not completed prior to this visit.  Cardiac Risk Factors include: advanced age (>63men, >52 women);hypertension     Objective:    Today's Vitals   10/18/23 0811  PainSc: 0-No pain   There is no height or weight on file to calculate BMI.     10/18/2023    8:15 AM 10/10/2023    1:40 PM 10/10/2023   12:58 PM 08/29/2023    8:28 AM 08/15/2023    8:57 AM 08/01/2023    8:19 AM 07/18/2023    9:00 AM  Advanced Directives  Does Patient Have a Medical Advance Directive? Yes Yes Yes Yes Yes Yes Yes  Type of Estate agent of [place];Living will Healthcare Power of [employer] of [place];Living will Healthcare Power of [place];Living will Healthcare Power of [place];Living will Healthcare Power of [place];Living will Healthcare Power of Attorney  Does patient want to make changes to medical advance directive?  No - Patient declined     No - Patient declined  Copy of Healthcare Power of Attorney in Chart? No - copy requested  No - copy requested No - copy requested No - copy requested No - copy requested No - copy requested No - copy requested  Would patient like information on creating a medical advance directive? No - Patient declined No - Patient declined No - Patient declined No - Patient declined No - Patient declined  No - Patient declined    Current Medications (verified) Outpatient Encounter Medications as of 10/18/2023  Medication Sig   aspirin  81 MG chewable tablet Chew 81 mg by mouth daily.   b complex vitamins capsule Take 1 capsule by mouth daily.   Calcium  Carbonate-Vit D-Min (CALCIUM  1200 PO) Take by mouth.   Cholecalciferol  25 MCG (1000 UT) tablet Take 2,000 Units by mouth daily.   clobetasol  cream (TEMOVATE ) 0.05 %    fluticasone (FLONASE) 50 MCG/ACT nasal spray Place 1 spray into both nostrils daily.   lidocaine -prilocaine  (EMLA ) cream Apply small amount to port and cover with saran wrap 1-2 hours prior to port access   loratadine (CLARITIN REDITABS) 10 MG dissolvable tablet Take 10 mg by mouth daily.   Melatonin 2.5 MG CHEW Chew by mouth.   Multiple Vitamins-Minerals (MULTIVITAMIN WITH MINERALS) tablet Take 1 tablet by mouth daily.   Pancrelipase , Lip-Prot-Amyl, (CREON ) 24000-76000 units CPEP TAKE 1 CAPSULE THREE TIMES DAILY BEFORE MEALS   senna-docusate (SENOKOT-S) 8.6-50 MG tablet Take by mouth.   simethicone  (GAS-X) 80 MG chewable tablet Chew 1 tablet (80 mg total) by mouth every 8 (eight) hours as needed for flatulence.   Facility-Administered Encounter Medications as of 10/18/2023  Medication   0.9 %  sodium chloride  infusion   calcium  gluconate 2 g in sodium chloride  0.9 % 100 mL IVPB   heparin  lock flush 100 UNIT/ML injection   prochlorperazine  (COMPAZINE ) 10 MG tablet   sodium chloride  flush (NS) 0.9 % injection 10 mL   sodium chloride  flush (NS) 0.9 % injection 10 mL   sodium chloride  flush (NS) 0.9 % injection 10 mL    Allergies (verified) Penicillin g   History: Past Medical  History:  Diagnosis Date   Allergy    Anemia    Bacteremia due to Streptococcus pneumoniae 03/19/2020   Cancer (HCC)    pancreatic cancer   Colon polyps    Family history of breast cancer    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   Neutropenia (HCC) 04/18/2020   Osteopenia after menopause 05/2017   femoral neck T score -2.0   Personal history of chemotherapy    current for pancreatic ca   PONV (postoperative nausea and vomiting)    Pure hypercholesterolemia    Sepsis (HCC) 03/19/2020   Past Surgical History:  Procedure Laterality Date   COLONOSCOPY  07/2015   WNL   COLONOSCOPY  2008/2011   COLONOSCOPY WITH PROPOFOL  N/A 07/10/2021   Procedure: COLONOSCOPY WITH PROPOFOL ;  Surgeon: Shane Darling, MD;  Location: [facility] ENDOSCOPY;  Service: Endoscopy;  Laterality: N/A;   IR CV LINE INJECTION  04/28/2021   OVARIAN CYST REMOVAL  1992   dermoid-Dr CAK   PORTA CATH INSERTION N/A 03/07/2020   Procedure: PORTA CATH INSERTION;  Surgeon: Celso College, MD;  Location: [facility] INVASIVE CV LAB;  Service: Cardiovascular;  Laterality: N/A;   TEE WITHOUT CARDIOVERSION N/A 03/21/2020   Procedure: TRANSESOPHAGEAL ECHOCARDIOGRAM (TEE);  Surgeon: Cherrie Cornwall, MD;  Location: [facility] ORS;  Service: Cardiovascular;  Laterality: N/A;   TUBAL LIGATION  1993   Family History  Problem Relation Age of Onset   Breast cancer Paternal Aunt 53   Diabetes Mother    Osteoporosis Mother    Hyperlipidemia Father    Rheumatic fever Father    Valvular heart disease Father    Cancer Paternal Grandmother        possible colon   Breast cancer Sister 64   Social History   Socioeconomic History   Marital status: Married    Spouse name: Not on file   Number of children: 2   Years of education: Not on file   Highest education level: Bachelor's degree (e.g., BA, AB, BS)  Occupational History   Occupation: Runner, broadcasting/film/video    Comment: retired   Occupation: Visual merchandiser  Tobacco Use   Smoking status: Former     Current packs/day: 0.00    Average packs/day: 1 pack/day for 10.0 years (10.0 ttl pk-yrs)    Types: Cigarettes    Start date: 06/29/1975    Quit date: 06/28/1985    Years since quitting: 38.3   Smokeless tobacco: Never   Tobacco comments:    Quit smoking 1987  Vaping Use   Vaping status: Never Used  Substance and Sexual Activity   Alcohol use: Yes    Alcohol/week: 5.0 standard drinks of alcohol    Types: 5 Glasses of wine per week    Comment: 0-2 mixed drinks a day   Drug use: No   Sexual activity: Not Currently    Birth control/protection: Post-menopausal  Other Topics Concern   Not on file  Social History Narrative   Not on file  Social Drivers of Corporate investment banker Strain: Low Risk  (10/18/2023)   Overall Financial Resource Strain (CARDIA)    Difficulty of Paying Living Expenses: Not hard at all  Food Insecurity: No Food Insecurity (10/18/2023)   Hunger Vital Sign    Worried About Running Out of Food in the Last Year: Never true    Ran Out of Food in the Last Year: Never true  Transportation Needs: No Transportation Needs (10/18/2023)   PRAPARE - Administrator, Civil Service (Medical): No    Lack of Transportation (Non-Medical): No  Physical Activity: Sufficiently Active (10/18/2023)   Exercise Vital Sign    Days of Exercise per Week: 6 days    Minutes of Exercise per Session: 30 min  Stress: No Stress Concern Present (10/18/2023)   [employer] of Occupational Health - Occupational Stress Questionnaire    Feeling of Stress : Not at all  Social Connections: Moderately Integrated (10/18/2023)   Social Connection and Isolation Panel [NHANES]    Frequency of Communication with Friends and Family: More than three times a week    Frequency of Social Gatherings with Friends and Family: More than three times a week    Attends Religious Services: More than 4 times per year    Active Member of [employer] or Organizations: No    Attends Banker  Meetings: Never    Marital Status: Married    Tobacco Counseling Counseling given: Not Answered Tobacco comments: Quit smoking 1987    Clinical Intake:  Pre-visit preparation completed: Yes  Pain : No/denies pain Pain Score: 0-No pain     BMI - recorded: 21.6 Nutritional Status: BMI of 19-24  Normal Nutritional Risks: None Diabetes: No  Lab Results  Component Value Date   HGBA1C 5.1 08/29/2023   HGBA1C 5.5 03/12/2020   HGBA1C 5.4 11/12/2019     How often do you need to have someone help you when you read instructions, pamphlets, or other written materials from your doctor or pharmacy?: 1 - Never  Interpreter Needed?: No  Information entered by :: Dellie Fergusson, LPN   Activities of Daily Living    10/18/2023    8:17 AM 10/18/2023    7:15 AM  In your present state of health, do you have any difficulty performing the following activities:  Hearing? 0 0  Vision? 0 0  Difficulty concentrating or making decisions? 0 0  Walking or climbing stairs? 0 0  Dressing or bathing? 0 0  Doing errands, shopping? 0 0  Preparing Food and eating ? N N  Using the Toilet? N N  In the past six months, have you accidently leaked urine? N N  Do you have problems with loss of bowel control? N N  Managing your Medications? N N  Managing your Finances? N N  Housekeeping or managing your Housekeeping? N N    Patient Care Team: Mazie Speed, MD as PCP - General (Family Medicine) Flinchum, Charlton Cooler, FNP (Family Medicine) Rochell Chroman, RN as Oncology Nurse Navigator [name], MD as Consulting Physician (Oncology) [facility], [facility]  Indicate any recent Medical Services you may have received from other than [facility] providers in the past year (date may be approximate).     Assessment:   This is a routine wellness examination for [employer].  Hearing/Vision screen Hearing Screening - Comments:: NO AIDS Vision Screening - Comments:: WEARS GLASSES ALL DAY- PATTY  VISION   Goals Addressed  This Visit's Progress    DIET - INCREASE WATER INTAKE         Depression Screen     10/18/2023    8:14 AM 10/13/2022    9:17 AM 08/13/2022    3:15 PM 10/08/2021    9:49 AM 09/10/2021    9:34 AM 11/01/2019    8:39 AM  PHQ 2/9 Scores  PHQ - 2 Score 0 0 0 0 0 0  PHQ- 9 Score 0  0   0    Fall Risk     10/18/2023    8:17 AM 10/18/2023    7:15 AM 10/09/2022    9:29 AM 08/13/2022    3:15 PM 10/08/2021    9:52 AM  Fall Risk   Falls in the past year? 1 1 0 0 0  Number falls in past yr: 0 0 0 0 0  Injury with Fall? 0 0 0 0 0  Risk for fall due to : No Fall Risks   No Fall Risks No Fall Risks  Follow up Falls prevention discussed;Falls evaluation completed   Falls evaluation completed Falls evaluation completed    MEDICARE RISK AT HOME:  Medicare Risk at Home Any stairs in or around the home?: Yes If so, are there any without handrails?: No Home free of loose throw rugs in walkways, pet beds, electrical cords, etc?: Yes Adequate lighting in your home to reduce risk of falls?: Yes Life alert?: No Use of a cane, walker or w/c?: No Grab bars in the bathroom?: No Shower chair or bench in shower?: Yes Elevated toilet seat or a handicapped toilet?: No  TIMED UP AND GO:  Was the test performed?  No  Cognitive Function: 6CIT completed        10/18/2023    8:18 AM 10/13/2022    9:18 AM  6CIT Screen  What Year? 0 points 0 points  What month? 0 points 0 points  What time? 0 points 0 points  Count back from 20 0 points 0 points  Months in reverse 0 points 0 points  Repeat phrase 0 points 0 points  Total Score 0 points 0 points    Immunizations Immunization History  Administered Date(s) Administered   Fluad Quad(high Dose 65+) 03/24/2022, 03/15/2023   Influenza,inj,Quad PF,6+ Mos 04/16/2020   Influenza,inj,quad, With Preservative 04/15/2017   Influenza-Unspecified 04/15/2016, 04/05/2018, 04/10/2019   Moderna Covid-19 Fall Seasonal  Vaccine 53yrs & older 06/29/2021   Moderna Sars-Covid-2 Vaccination 09/01/2019, 09/29/2019, 03/02/2020   PNEUMOCOCCAL CONJUGATE-20 08/02/2022   Tdap 09/01/2010, 05/12/2017   Zoster Recombinant(Shingrix) 02/16/2023    Screening Tests Health Maintenance  Topic Date Due   COVID-19 Vaccine (5 - 2024-25 season) 02/27/2023   Zoster Vaccines- Shingrix (2 of 2) 04/13/2023   MAMMOGRAM  01/14/2024   INFLUENZA VACCINE  01/27/2024   Medicare Annual Wellness (AWV)  10/17/2024   DTaP/Tdap/Td (3 - Td or Tdap) 05/13/2027   Colonoscopy  07/11/2031   Pneumonia Vaccine 57+ Years old  Completed   DEXA SCAN  Completed   Hepatitis C Screening  Completed   HPV VACCINES  Aged Out   Meningococcal B Vaccine  Aged Out    Health Maintenance  Health Maintenance Due  Topic Date Due   COVID-19 Vaccine (5 - 2024-25 season) 02/27/2023   Zoster Vaccines- Shingrix (2 of 2) 04/13/2023   Health Maintenance Items Addressed: DEXA ORDERED IN FEB; MAMMOGRAM UP TO DATE, COLONOSCOPY UP TO DATE, SHOTS UP TO DATE  Additional Screening:  Vision Screening: Recommended  annual ophthalmology exams for early detection of glaucoma and other disorders of the eye.  Dental Screening: Recommended annual dental exams for proper oral hygiene  Community Resource Referral / Chronic Care Management: CRR required this visit?  No   CCM required this visit?  No     Plan:     I have personally reviewed and noted the following in the patient's chart:   Medical and social history Use of alcohol, tobacco or illicit drugs  Current medications and supplements including opioid prescriptions. Patient is not currently taking opioid prescriptions. Functional ability and status Nutritional status Physical activity Advanced directives List of other physicians Hospitalizations, surgeries, and ER visits in previous 12 months Vitals Screenings to include cognitive, depression, and falls Referrals and appointments  In addition, I  have reviewed and discussed with patient certain preventive protocols, quality metrics, and best practice recommendations. A written personalized care plan for preventive services as well as general preventive health recommendations were provided to patient.     Pinky Bright, LPN   1/61/0960   After Visit Summary: (MyChart) Due to this being a telephonic visit, the after visit summary with patients personalized plan was offered to patient via MyChart   Notes: Nothing significant to report at this time.

## 2023-10-18 NOTE — Assessment & Plan Note (Signed)
 On a recent CT scan of the chest abdomen pelvis, there was mention of a 4.4 to 4.5 cm ascending thoracic aortic aneurysm.  I have independently reviewed the scan and agree with that measurement.  Going back to her CT scan in September 2021, this actually measured 4.4 cm at that time.  This is encouraging and that there has been minimal growth over several years.  This would generally be followed with annual CT scans.  She gets regular CT scan for her oncologic issues, and these are certainly usable to follow the aneurysm as well.  I will plan to see her back in 1 year and I can review her studies and determine if further evaluation or treatment is necessary.  Continued abstinence from tobacco and control of blood pressure are important for avoiding growth of this aneurysm.

## 2023-10-18 NOTE — Progress Notes (Signed)
 Patient ID: Cassandra [name], female   DOB: 01-07-1956, 68 y.o.   MRN: [id number]  Chief Complaint  Patient presents with   New Patient (Initial Visit)    Ref Wilhelmenia Harada consult thoracic aortic aneurysm    HPI Cassandra [name] is a 68 y.o. female.  I am asked to see the patient by Dr. Wilhelmenia Harada for evaluation of thoracic aortic aneurysm.  I had actually seen the patient about 4 years ago and placed her port for her pancreatic cancer, but had not seen her in the office since that time.  The port is still working well.  She is followed regularly by her oncologist for the pancreatic cancer which has been stable.  She has gotten a series of CT scans going back to 2021.  On a recent CT scan of the chest abdomen pelvis, there was mention of a 4.4 to 4.5 cm ascending thoracic aortic aneurysm.  I have independently reviewed the scan and agree with that measurement.  Going back to her CT scan in September 2021, this actually measured 4.4 cm at that time.  She does not have any current chest pain, upper back pain, or signs of peripheral embolization.  She has not smoked in many years.  She says her blood pressure is generally under control.   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   Neutropenia (HCC) 04/18/2020   Osteopenia after menopause 05/2017   femoral neck T score -2.0   Personal history of chemotherapy    current for pancreatic ca   PONV (postoperative nausea and vomiting)    Pure hypercholesterolemia    Sepsis (HCC) 03/19/2020    Past Surgical History:  Procedure Laterality Date   COLONOSCOPY  07/2015   WNL   COLONOSCOPY  2008/2011   COLONOSCOPY WITH PROPOFOL  N/A 07/10/2021   Procedure: COLONOSCOPY WITH PROPOFOL ;  Surgeon: Shane Darling, MD;  Location: [facility] ENDOSCOPY;  Service:  Endoscopy;  Laterality: N/A;   IR CV LINE INJECTION  04/28/2021   OVARIAN CYST REMOVAL  1992   dermoid-Dr CAK   PORTA CATH INSERTION N/A 03/07/2020   Procedure: PORTA CATH INSERTION;  Surgeon: Celso College, MD;  Location: [facility] INVASIVE CV LAB;  Service: Cardiovascular;  Laterality: N/A;   TEE WITHOUT CARDIOVERSION N/A 03/21/2020   Procedure: TRANSESOPHAGEAL ECHOCARDIOGRAM (TEE);  Surgeon: Cherrie Cornwall, MD;  Location: [facility] ORS;  Service: Cardiovascular;  Laterality: N/A;   TUBAL LIGATION  1993     Family History  Problem Relation Age of Onset   Breast cancer Paternal Aunt 71   Diabetes Mother    Osteoporosis Mother    Hyperlipidemia Father    Rheumatic fever Father    Valvular heart disease Father    Cancer Paternal Grandmother        possible colon   Breast cancer Sister 79      Social History   Tobacco Use   Smoking status: Former    Current packs/day: 0.00    Average packs/day: 1 pack/day for 10.0 years (10.0 ttl pk-yrs)    Types: Cigarettes    Start date: 06/29/1975    Quit date: 06/28/1985    Years since quitting: 38.3   Smokeless tobacco: Never  Tobacco comments:    Quit smoking 1987  Vaping Use   Vaping status: Never Used  Substance Use Topics   Alcohol use: Yes    Alcohol/week: 5.0 standard drinks of alcohol    Types: 5 Glasses of wine per week    Comment: 0-2 mixed drinks a day   Drug use: No     Allergies  Allergen Reactions   Penicillin G Hives and Swelling    Rxn as a child    Current Outpatient Medications  Medication Sig Dispense Refill   aspirin  81 MG chewable tablet Chew 81 mg by mouth daily.     b complex vitamins capsule Take 1 capsule by mouth daily.     Calcium  Carbonate-Vit D-Min (CALCIUM  1200 PO) Take by mouth.     Cholecalciferol  25 MCG (1000 UT) tablet Take 2,000 Units by mouth daily.     clobetasol  cream (TEMOVATE ) 0.05 %      fluticasone (FLONASE) 50 MCG/ACT nasal spray Place 1 spray into both nostrils daily.      lidocaine -prilocaine  (EMLA ) cream Apply small amount to port and cover with saran wrap 1-2 hours prior to port access 30 g 6   loratadine (CLARITIN REDITABS) 10 MG dissolvable tablet Take 10 mg by mouth daily.     Melatonin 2.5 MG CHEW Chew by mouth.     Multiple Vitamins-Minerals (MULTIVITAMIN WITH MINERALS) tablet Take 1 tablet by mouth daily.     Pancrelipase , Lip-Prot-Amyl, (CREON ) 24000-76000 units CPEP TAKE 1 CAPSULE THREE TIMES DAILY BEFORE MEALS 200 capsule 3   senna-docusate (SENOKOT-S) 8.6-50 MG tablet Take by mouth.     simethicone  (GAS-X) 80 MG chewable tablet Chew 1 tablet (80 mg total) by mouth every 8 (eight) hours as needed for flatulence. 60 tablet 0   No current facility-administered medications for this visit.   Facility-Administered Medications Ordered in Other Visits  Medication Dose Route Frequency Provider Last Rate Last Admin   0.9 %  sodium chloride  infusion   Intravenous Continuous [name], MD 10 mL/hr at 09/12/23 1241 Infusion Verify at 09/12/23 1241   calcium  gluconate 2 g in sodium chloride  0.9 % 100 mL IVPB  2 g Intravenous PRN [name], MD   Stopped at 09/12/23 1101   heparin  lock flush 100 UNIT/ML injection            prochlorperazine  (COMPAZINE ) 10 MG tablet            sodium chloride  flush (NS) 0.9 % injection 10 mL  10 mL Intravenous PRN [name], MD   10 mL at 02/18/21 0858   sodium chloride  flush (NS) 0.9 % injection 10 mL  10 mL Intravenous Once Borders, Carlene Che, NP       sodium chloride  flush (NS) 0.9 % injection 10 mL  10 mL Intravenous Once [name], MD          REVIEW OF SYSTEMS (Negative unless checked)  Constitutional: [] Weight loss  [] Fever  [] Chills Cardiac: [] Chest pain   [] Chest pressure   [] Palpitations   [] Shortness of breath when laying flat   [] Shortness of breath at rest   [] Shortness of breath with exertion. Vascular:  [] Pain in legs with walking   [] Pain in legs at rest   [] Pain in legs when laying flat   [] Claudication   [] Pain in  feet when walking  [] Pain in feet at rest  [] Pain in feet when laying flat   [] History of DVT   [] Phlebitis   [] Swelling in legs   []   Varicose veins   [] Non-healing ulcers Pulmonary:   [] Uses home oxygen   [] Productive cough   [] Hemoptysis   [] Wheeze  [] COPD   [] Asthma Neurologic:  [] Dizziness  [] Blackouts   [] Seizures   [] History of stroke   [] History of TIA  [] Aphasia   [] Temporary blindness   [] Dysphagia   [] Weakness or numbness in arms   [] Weakness or numbness in legs Musculoskeletal:  [] Arthritis   [] Joint swelling   [] Joint pain   [] Low back pain Hematologic:  [] Easy bruising  [] Easy bleeding   [] Hypercoagulable state   [x] Anemic  [] Hepatitis Gastrointestinal:  [] Blood in stool   [] Vomiting blood  [] Gastroesophageal reflux/heartburn   [] Abdominal pain Genitourinary:  [] Chronic kidney disease   [] Difficult urination  [] Frequent urination  [] Burning with urination   [] Hematuria Skin:  [] Rashes   [] Ulcers   [] Wounds Psychological:  [] History of anxiety   []  History of major depression.    Physical Exam BP 133/80   Pulse 65   Resp 18   Ht 5\' 7"  (1.702 m)   Wt 136 lb 3.2 oz (61.8 kg)   BMI 21.33 kg/m  Gen:  WD/WN, NAD Head: [place]/AT, No temporalis wasting.  Ear/Nose/Throat: Hearing grossly intact, nares w/o erythema or drainage, oropharynx w/o Erythema/Exudate Eyes: Conjunctiva clear, sclera non-icteric  Neck: trachea midline.  No JVD.  Pulmonary:  Good air movement, respirations not labored, no use of accessory muscles  Cardiac: RRR, no JVD Vascular:  Vessel Right Left  Radial Palpable Palpable                                   Gastrointestinal:. No masses, surgical incisions, or scars. Musculoskeletal: M/S 5/5 throughout.  Extremities without ischemic changes.  No deformity or atrophy. No edema. Neurologic: Sensation grossly intact in extremities.  Symmetrical.  Speech is fluent. Motor exam as listed above. Psychiatric: Judgment intact, Mood & affect appropriate for pt's  clinical situation. Dermatologic: No rashes or ulcers noted.  No cellulitis or open wounds.    Radiology CT CHEST ABDOMEN PELVIS W CONTRAST Result Date: 10/09/2023 CLINICAL DATA:  Pancreatic cancer restaging, ongoing chemotherapy status post Whipple procedure * Tracking Code: BO * EXAM: CT CHEST, ABDOMEN, AND PELVIS WITH CONTRAST TECHNIQUE: Multidetector CT imaging of the chest, abdomen and pelvis was performed following the standard protocol during bolus administration of intravenous contrast. RADIATION DOSE REDUCTION: This exam was performed according to the departmental dose-optimization program which includes automated exposure control, adjustment of the mA and/or kV according to patient size and/or use of iterative reconstruction technique. CONTRAST:  OMNIPAQUE  IOHEXOL  300 MG/ML  SOLN COMPARISON:  06/27/2023 FINDINGS: CT CHEST FINDINGS Cardiovascular: Unchanged enlargement of the tubular ascending thoracic aorta measuring up to 4.5 x 4.4 cm (series 2, image 31). Scattered aortic atherosclerosis normal heart size. No pericardial effusion. Mediastinum/Nodes: No enlarged mediastinal, hilar, or axillary lymph nodes. Thyroid gland, trachea, and esophagus demonstrate no significant findings. Lungs/Pleura: Bandlike scarring of the right lung base, unchanged. No significant change in an irregular nodule of the dependent left upper lobe measuring 0.6 cm (series 4, image 41). No significant change in adjacent nodules of the dependent left lower lobe, a cavitary nodule measuring 0.9 x 0.7 cm (series 4, image 77) and an adjacent solid nodule measuring 0.5 cm (series 4, image 72). Unchanged small nodule of the anterior inferior right upper lobe measuring 0.5 cm (series 4, image 71). No new nodules. No pleural effusion or pneumothorax.  Musculoskeletal: No chest wall abnormality. No acute osseous findings. CT ABDOMEN PELVIS FINDINGS Hepatobiliary: Unchanged, very subtle hypodense lesion of the inferior margin of  hepatic segment V adjacent to the gallbladder fossa measuring 0.8 cm (series 2, image 70). Status post cholecystectomy and hepatojejunostomy. No biliary dilatation. Pancreas: Status post Whipple pancreaticoduodenectomy. Diminished size and solid character of soft tissue at the right aspect of the central superior mesenteric vein, on today's examination measuring 1.0 x 0.8 cm, previously 1.3 x 1.1 cm (series 2, image 75). No pancreatic ductal dilatation or surrounding inflammatory changes. Spleen: Normal in size without significant abnormality. Adrenals/Urinary Tract: Adrenal glands are unremarkable. Kidneys are normal, without renal calculi, solid lesion, or hydronephrosis. Bladder is unremarkable. Stomach/Bowel: Status post distal gastrectomy and gastrojejunostomy. Appendix appears normal. No evidence of bowel wall thickening, distention, or inflammatory changes. Vascular/Lymphatic: Aortic atherosclerosis. No enlarged abdominal or pelvic lymph nodes. Reproductive: No mass. Unchanged left ovarian cyst measuring 5.1 x 3.1 cm (series 2, image 102). Other: No abdominal wall hernia or abnormality. No ascites. Musculoskeletal: No acute osseous findings. IMPRESSION: 1. Status post Whipple pancreaticoduodenectomy. Diminished size and solid character of soft tissue at the right aspect of the central superior mesenteric vein, on today's examination measuring 1.0 x 0.8 cm, previously 1.3 x 1.1 cm which remains highly suspicious for local recurrence. 2. Unchanged, very subtle hypodense lesion of the inferior margin of hepatic segment V adjacent to the gallbladder fossa measuring 0.8 cm. Continued attention on follow-up. 3. No significant change in small bilateral pulmonary nodules, highly suspicious for small pulmonary metastases. 4. Unchanged left ovarian cyst measuring 5.1 x 3.1 cm. Although abnormal in the late postmenopausal setting, this is probably benign and attention on follow-up is sufficient in the setting of known  malignancy. 5. Unchanged enlargement of the tubular ascending thoracic aorta measuring up to 4.5 x 4.4 cm. Ascending thoracic aortic aneurysm. Attention on follow-up in the setting of known primary malignancy. Aortic Atherosclerosis (ICD10-I70.0). Electronically Signed   By: Fredricka Jenny M.D.   On: 10/09/2023 16:57    Labs Recent Results (from the past 2160 hours)  CMP (Cancer Center only)     Status: Abnormal   Collection Time: 08/01/23  8:08 AM  Result Value Ref Range   Sodium 138 135 - 145 mmol/L   Potassium 3.8 3.5 - 5.1 mmol/L   Chloride 109 98 - 111 mmol/L   CO2 23 22 - 32 mmol/L   Glucose, Bld 139 (H) 70 - 99 mg/dL    Comment: Glucose reference range applies only to samples taken after fasting for at least 8 hours.   BUN 11 8 - 23 mg/dL   Creatinine 4.09 8.11 - 1.00 mg/dL   Calcium  8.5 (L) 8.9 - 10.3 mg/dL   Total Protein 6.5 6.5 - 8.1 g/dL   Albumin 4.0 3.5 - 5.0 g/dL   AST 26 15 - 41 U/L   ALT 21 0 - 44 U/L   Alkaline Phosphatase 65 38 - 126 U/L   Total Bilirubin 0.7 0.0 - 1.2 mg/dL   GFR, Estimated >91 >47 mL/min    Comment: (NOTE) Calculated using the CKD-EPI Creatinine Equation (2021)    Anion gap 6 5 - 15    Comment: Performed at [facility], [address] [place], [place] [address]  CBC with Differential (Cancer Center Only)     Status: Abnormal   Collection Time: 08/01/23  8:08 AM  Result Value Ref Range   WBC Count 4.4 4.0 - 10.5 K/uL  RBC 3.46 (L) 3.87 - 5.11 MIL/uL   Hemoglobin 11.6 (L) 12.0 - 15.0 g/dL   HCT 16.1 (L) 09.6 - 04.5 %   MCV 98.6 80.0 - 100.0 fL   MCH 33.5 26.0 - 34.0 pg   MCHC 34.0 30.0 - 36.0 g/dL   RDW 40.9 81.1 - 91.4 %   Platelet Count 179 150 - 400 K/uL   nRBC 0.0 0.0 - 0.2 %   Neutrophils Relative % 56 %   Neutro Abs 2.5 1.7 - 7.7 K/uL   Lymphocytes Relative 29 %   Lymphs Abs 1.3 0.7 - 4.0 K/uL   Monocytes Relative 11 %   Monocytes Absolute 0.5 0.1 - 1.0 K/uL   Eosinophils Relative 2 %   Eosinophils Absolute 0.1 0.0 -  0.5 K/uL   Basophils Relative 1 %   Basophils Absolute 0.0 0.0 - 0.1 K/uL   Immature Granulocytes 1 %   Abs Immature Granulocytes 0.02 0.00 - 0.07 K/uL    Comment: Performed at [facility], [address] [place], [place] [address]  CEA     Status: None   Collection Time: 08/15/23  8:46 AM  Result Value Ref Range   CEA 4.1 0.0 - 4.7 ng/mL    Comment: (NOTE)                             Nonsmokers          <3.9                             Smokers             <5.6 [employer] Electrochemiluminescence Immunoassay (ECLIA) Values obtained with different assay methods or kits cannot be used interchangeably.  Results cannot be interpreted as absolute evidence of the presence or absence of malignant disease. Performed At: [facility] [address] [place], [place] [id number] Pearlean Botts MD QI:[id number]   Cancer antigen 19-9     Status: None   Collection Time: 08/15/23  8:46 AM  Result Value Ref Range   CA 19-9 6 0 - 35 U/mL    Comment: (NOTE) [employer] Electrochemiluminescence Immunoassay (ECLIA) Values obtained with different assay methods or kits cannot be used interchangeably.  Results cannot be interpreted as absolute evidence of the presence or absence of malignant disease. Performed At: [facility] [address] [place], [place] [id number] Pearlean Botts MD OZ:[id number]   CMP (Cancer Center only)     Status: Abnormal   Collection Time: 08/15/23  8:46 AM  Result Value Ref Range   Sodium 138 135 - 145 mmol/L   Potassium 3.9 3.5 - 5.1 mmol/L   Chloride 106 98 - 111 mmol/L   CO2 24 22 - 32 mmol/L   Glucose, Bld 125 (H) 70 - 99 mg/dL    Comment: Glucose reference range applies only to samples taken after fasting for at least 8 hours.   BUN 11 8 - 23 mg/dL   Creatinine 2.59 5.63 - 1.00 mg/dL   Calcium  8.4 (L) 8.9 - 10.3 mg/dL   Total Protein 6.6 6.5 - 8.1 g/dL   Albumin 4.0 3.5 - 5.0 g/dL   AST 31 15 - 41 U/L   ALT 28 0 - 44  U/L   Alkaline Phosphatase 69 38 - 126 U/L   Total Bilirubin 0.8 0.0 - 1.2 mg/dL   GFR, Estimated >87 >  60 mL/min    Comment: (NOTE) Calculated using the CKD-EPI Creatinine Equation (2021)    Anion gap 8 5 - 15    Comment: Performed at [facility], [address] [place], [place] [address]  CBC with Differential (Cancer Center Only)     Status: Abnormal   Collection Time: 08/15/23  8:46 AM  Result Value Ref Range   WBC Count 4.9 4.0 - 10.5 K/uL   RBC 3.53 (L) 3.87 - 5.11 MIL/uL   Hemoglobin 11.9 (L) 12.0 - 15.0 g/dL   HCT 69.6 (L) 29.5 - 28.4 %   MCV 97.7 80.0 - 100.0 fL   MCH 33.7 26.0 - 34.0 pg   MCHC 34.5 30.0 - 36.0 g/dL   RDW 13.2 44.0 - 10.2 %   Platelet Count 180 150 - 400 K/uL   nRBC 0.0 0.0 - 0.2 %   Neutrophils Relative % 59 %   Neutro Abs 2.9 1.7 - 7.7 K/uL   Lymphocytes Relative 28 %   Lymphs Abs 1.4 0.7 - 4.0 K/uL   Monocytes Relative 11 %   Monocytes Absolute 0.5 0.1 - 1.0 K/uL   Eosinophils Relative 2 %   Eosinophils Absolute 0.1 0.0 - 0.5 K/uL   Basophils Relative 0 %   Basophils Absolute 0.0 0.0 - 0.1 K/uL   Immature Granulocytes 0 %   Abs Immature Granulocytes 0.01 0.00 - 0.07 K/uL    Comment: Performed at [facility], [address] [place], [place] [address]  CBC with Differential (Cancer Center Only)     Status: Abnormal   Collection Time: 08/29/23  8:05 AM  Result Value Ref Range   WBC Count 4.8 4.0 - 10.5 K/uL   RBC 3.50 (L) 3.87 - 5.11 MIL/uL   Hemoglobin 11.8 (L) 12.0 - 15.0 g/dL   HCT 64.4 (L) 03.4 - 74.2 %   MCV 99.1 80.0 - 100.0 fL   MCH 33.7 26.0 - 34.0 pg   MCHC 34.0 30.0 - 36.0 g/dL   RDW 59.5 63.8 - 75.6 %   Platelet Count 163 150 - 400 K/uL   nRBC 0.0 0.0 - 0.2 %   Neutrophils Relative % 60 %   Neutro Abs 2.9 1.7 - 7.7 K/uL   Lymphocytes Relative 24 %   Lymphs Abs 1.2 0.7 - 4.0 K/uL   Monocytes Relative 13 %   Monocytes Absolute 0.6 0.1 - 1.0 K/uL   Eosinophils Relative 2 %   Eosinophils Absolute 0.1 0.0 - 0.5  K/uL   Basophils Relative 1 %   Basophils Absolute 0.0 0.0 - 0.1 K/uL   Immature Granulocytes 0 %   Abs Immature Granulocytes 0.01 0.00 - 0.07 K/uL    Comment: Performed at [facility], [address] [place], [place] [address]  CMP (Cancer Center only)     Status: Abnormal   Collection Time: 08/29/23  8:05 AM  Result Value Ref Range   Sodium 139 135 - 145 mmol/L   Potassium 3.8 3.5 - 5.1 mmol/L   Chloride 106 98 - 111 mmol/L   CO2 24 22 - 32 mmol/L   Glucose, Bld 108 (H) 70 - 99 mg/dL    Comment: Glucose reference range applies only to samples taken after fasting for at least 8 hours.   BUN 11 8 - 23 mg/dL   Creatinine 5.18 8.41 - 1.00 mg/dL   Calcium  8.7 (L) 8.9 - 10.3 mg/dL   Total Protein 6.4 (L) 6.5 - 8.1 g/dL  Albumin 4.0 3.5 - 5.0 g/dL   AST 25 15 - 41 U/L   ALT 22 0 - 44 U/L   Alkaline Phosphatase 74 38 - 126 U/L   Total Bilirubin 0.9 0.0 - 1.2 mg/dL   GFR, Estimated >16 >10 mL/min    Comment: (NOTE) Calculated using the CKD-EPI Creatinine Equation (2021)    Anion gap 9 5 - 15    Comment: Performed at [facility], [address] [place], [place] [address]  Lipid panel     Status: None   Collection Time: 08/29/23  8:05 AM  Result Value Ref Range   Cholesterol 163 0 - 200 mg/dL   Triglycerides 409 <811 mg/dL   HDL 64 >91 mg/dL   Total CHOL/HDL Ratio 2.5 RATIO   VLDL 21 0 - 40 mg/dL   LDL Cholesterol 78 0 - 99 mg/dL    Comment:        Total Cholesterol/HDL:CHD Risk Coronary Heart Disease Risk Table                     Men   Women  1/2 Average Risk   3.4   3.3  Average Risk       5.0   4.4  2 X Average Risk   9.6   7.1  3 X Average Risk  23.4   11.0        Use the calculated Patient Ratio above and the CHD Risk Table to determine the patient's CHD Risk.        ATP III CLASSIFICATION (LDL):  <100     mg/dL   Optimal  478-295  mg/dL   Near or Above                    Optimal  130-159  mg/dL   Borderline  621-308  mg/dL   High  >657      mg/dL   Very High Performed at [facility], [address] [place], [place] [address]   Hemoglobin A1c     Status: None   Collection Time: 08/29/23  8:05 AM  Result Value Ref Range   Hgb A1c MFr Bld 5.1 4.8 - 5.6 %    Comment: (NOTE) Pre diabetes:          5.7%-6.4%  Diabetes:              >6.4%  Glycemic control for   <7.0% adults with diabetes    Mean Plasma Glucose 99.67 mg/dL    Comment: Performed at [facility], [address], [place], [place] [address]  Vitamin D  25 hydroxy     Status: None   Collection Time: 08/29/23  9:50 AM  Result Value Ref Range   Vit D, 25-Hydroxy 40.80 30 - 100 ng/mL    Comment: (NOTE) Vitamin D  deficiency has been defined by the Institute of Medicine  and an Endocrine Society practice guideline as a level of serum 25-OH  vitamin D  less than 20 ng/mL (1,2). The Endocrine Society went on to  further define vitamin D  insufficiency as a level between 21 and 29  ng/mL (2).  1. IOM (Institute of Medicine). 2010. Dietary reference intakes for  calcium  and D. [place]: [employer]. 2. Holick MF, Binkley [place], Bischoff-Ferrari HA, et al. Evaluation,  treatment, and prevention of vitamin D  deficiency: an Endocrine  Society clinical practice guideline, JCEM. 2011 Jul; 96(7): 1911-30.  Performed at [facility], [address], [place], [place] [address]   CMP (Cancer Center only)     Status: Abnormal   Collection Time: 09/12/23  8:45 AM  Result Value Ref Range   Sodium 137 135 - 145 mmol/L   Potassium 4.0 3.5 - 5.1 mmol/L   Chloride 107 98 - 111 mmol/L   CO2 24 22 - 32 mmol/L   Glucose, Bld 104 (H) 70 - 99 mg/dL    Comment: Glucose reference range applies only to samples taken after fasting for at least 8 hours.   BUN 19 8 - 23 mg/dL   Creatinine 1.91 4.78 - 1.00 mg/dL   Calcium  8.4 (L) 8.9 - 10.3 mg/dL   Total Protein 6.2 (L) 6.5 - 8.1 g/dL   Albumin 3.9 3.5 - 5.0 g/dL   AST 29 15 - 41 U/L   ALT 27  0 - 44 U/L   Alkaline Phosphatase 64 38 - 126 U/L   Total Bilirubin 0.4 0.0 - 1.2 mg/dL   GFR, Estimated >29 >56 mL/min    Comment: (NOTE) Calculated using the CKD-EPI Creatinine Equation (2021)    Anion gap 6 5 - 15    Comment: Performed at [facility], [address] [place], [place] [address]  CBC with Differential (Cancer Center Only)     Status: Abnormal   Collection Time: 09/12/23  8:45 AM  Result Value Ref Range   WBC Count 4.9 4.0 - 10.5 K/uL   RBC 3.44 (L) 3.87 - 5.11 MIL/uL   Hemoglobin 11.4 (L) 12.0 - 15.0 g/dL   HCT 65.7 (L) 84.6 - 96.2 %   MCV 100.6 (H) 80.0 - 100.0 fL   MCH 33.1 26.0 - 34.0 pg   MCHC 32.9 30.0 - 36.0 g/dL   RDW 95.2 84.1 - 32.4 %   Platelet Count 163 150 - 400 K/uL   nRBC 0.0 0.0 - 0.2 %   Neutrophils Relative % 62 %   Neutro Abs 3.0 1.7 - 7.7 K/uL   Lymphocytes Relative 25 %   Lymphs Abs 1.2 0.7 - 4.0 K/uL   Monocytes Relative 11 %   Monocytes Absolute 0.5 0.1 - 1.0 K/uL   Eosinophils Relative 2 %   Eosinophils Absolute 0.1 0.0 - 0.5 K/uL   Basophils Relative 0 %   Basophils Absolute 0.0 0.0 - 0.1 K/uL   Immature Granulocytes 0 %   Abs Immature Granulocytes 0.02 0.00 - 0.07 K/uL    Comment: Performed at [facility], [address] [place], [place] [address]  CEA     Status: Abnormal   Collection Time: 09/12/23  8:45 AM  Result Value Ref Range   CEA 4.9 (H) 0.0 - 4.7 ng/mL    Comment: (NOTE)                             Nonsmokers          <3.9                             Smokers             <5.6 [employer] Electrochemiluminescence Immunoassay (ECLIA) Values obtained with different assay methods or kits cannot be used interchangeably.  Results cannot be interpreted as absolute evidence of the presence or absence of malignant disease. Performed At: [facility] [address] [place], [place] [id number] Pearlean Botts MD  ZO:[id number]   Cancer antigen 19-9     Status: None   Collection Time: 09/12/23   8:45 AM  Result Value Ref Range   CA 19-9 6 0 - 35 U/mL    Comment: (NOTE) [employer] Electrochemiluminescence Immunoassay (ECLIA) Values obtained with different assay methods or kits cannot be used interchangeably.  Results cannot be interpreted as absolute evidence of the presence or absence of malignant disease. Performed At: [facility] [address] [place], [place] [id number] Pearlean Botts MD [id number]   CMP (Cancer Center only)     Status: Abnormal   Collection Time: 09/26/23  8:26 AM  Result Value Ref Range   Sodium 141 135 - 145 mmol/L   Potassium 3.9 3.5 - 5.1 mmol/L   Chloride 111 98 - 111 mmol/L   CO2 24 22 - 32 mmol/L   Glucose, Bld 99 70 - 99 mg/dL    Comment: Glucose reference range applies only to samples taken after fasting for at least 8 hours.   BUN 13 8 - 23 mg/dL   Creatinine 4.69 6.29 - 1.00 mg/dL   Calcium  8.6 (L) 8.9 - 10.3 mg/dL   Total Protein 6.3 (L) 6.5 - 8.1 g/dL   Albumin 3.9 3.5 - 5.0 g/dL   AST 32 15 - 41 U/L   ALT 28 0 - 44 U/L   Alkaline Phosphatase 77 38 - 126 U/L   Total Bilirubin 0.7 0.0 - 1.2 mg/dL   GFR, Estimated >52 >84 mL/min    Comment: (NOTE) Calculated using the CKD-EPI Creatinine Equation (2021)    Anion gap 6 5 - 15    Comment: Performed at [facility], [address] [place], [place] [address]  CBC with Differential (Cancer Center Only)     Status: Abnormal   Collection Time: 09/26/23  8:26 AM  Result Value Ref Range   WBC Count 4.5 4.0 - 10.5 K/uL   RBC 3.45 (L) 3.87 - 5.11 MIL/uL   Hemoglobin 11.4 (L) 12.0 - 15.0 g/dL   HCT 01.0 (L) 27.2 - 53.6 %   MCV 99.4 80.0 - 100.0 fL   MCH 33.0 26.0 - 34.0 pg   MCHC 33.2 30.0 - 36.0 g/dL   RDW 64.4 03.4 - 74.2 %   Platelet Count 172 150 - 400 K/uL   nRBC 0.0 0.0 - 0.2 %   Neutrophils Relative % 59 %   Neutro Abs 2.7 1.7 - 7.7 K/uL   Lymphocytes Relative 23 %   Lymphs Abs 1.0 0.7 - 4.0 K/uL   Monocytes Relative 13 %   Monocytes Absolute 0.6 0.1  - 1.0 K/uL   Eosinophils Relative 4 %   Eosinophils Absolute 0.2 0.0 - 0.5 K/uL   Basophils Relative 1 %   Basophils Absolute 0.0 0.0 - 0.1 K/uL   Immature Granulocytes 0 %   Abs Immature Granulocytes 0.02 0.00 - 0.07 K/uL    Comment: Performed at [facility], [address] [place], [place] [address]  CMP (Cancer Center only)     Status: Abnormal   Collection Time: 10/10/23 12:45 PM  Result Value Ref Range   Sodium 138 135 - 145 mmol/L   Potassium 3.7 3.5 - 5.1 mmol/L   Chloride 106 98 - 111 mmol/L   CO2 23 22 - 32 mmol/L   Glucose, Bld 165 (H) 70 - 99 mg/dL    Comment: Glucose reference range applies only to samples taken after fasting for at least 8 hours.  BUN 17 8 - 23 mg/dL   Creatinine 0.98 1.19 - 1.00 mg/dL   Calcium  8.8 (L) 8.9 - 10.3 mg/dL   Total Protein 6.6 6.5 - 8.1 g/dL   Albumin 4.1 3.5 - 5.0 g/dL   AST 28 15 - 41 U/L   ALT 25 0 - 44 U/L   Alkaline Phosphatase 83 38 - 126 U/L   Total Bilirubin 0.6 0.0 - 1.2 mg/dL   GFR, Estimated >14 >78 mL/min    Comment: (NOTE) Calculated using the CKD-EPI Creatinine Equation (2021)    Anion gap 9 5 - 15    Comment: Performed at [facility], [address] [place], [place] [address]  CBC with Differential (Cancer Center Only)     Status: Abnormal   Collection Time: 10/10/23 12:45 PM  Result Value Ref Range   WBC Count 6.5 4.0 - 10.5 K/uL   RBC 3.57 (L) 3.87 - 5.11 MIL/uL   Hemoglobin 11.7 (L) 12.0 - 15.0 g/dL   HCT 13.0 (L) 86.5 - 78.4 %   MCV 98.6 80.0 - 100.0 fL   MCH 32.8 26.0 - 34.0 pg   MCHC 33.2 30.0 - 36.0 g/dL   RDW 69.6 29.5 - 28.4 %   Platelet Count 180 150 - 400 K/uL   nRBC 0.0 0.0 - 0.2 %   Neutrophils Relative % 65 %   Neutro Abs 4.3 1.7 - 7.7 K/uL   Lymphocytes Relative 20 %   Lymphs Abs 1.3 0.7 - 4.0 K/uL   Monocytes Relative 10 %   Monocytes Absolute 0.7 0.1 - 1.0 K/uL   Eosinophils Relative 3 %   Eosinophils Absolute 0.2 0.0 - 0.5 K/uL   Basophils Relative 1 %   Basophils  Absolute 0.0 0.0 - 0.1 K/uL   Immature Granulocytes 1 %   Abs Immature Granulocytes 0.03 0.00 - 0.07 K/uL    Comment: Performed at [facility], [address] [place], [place] [address]  CEA     Status: Abnormal   Collection Time: 10/10/23 12:45 PM  Result Value Ref Range   CEA 6.4 (H) 0.0 - 4.7 ng/mL    Comment: (NOTE)                             Nonsmokers          <3.9                             Smokers             <5.6 [employer] Electrochemiluminescence Immunoassay (ECLIA) Values obtained with different assay methods or kits cannot be used interchangeably.  Results cannot be interpreted as absolute evidence of the presence or absence of malignant disease. Performed At: [facility] [address] [place], [place] [id number] Pearlean Botts MD [id number]   Cancer antigen 19-9     Status: None   Collection Time: 10/10/23 12:45 PM  Result Value Ref Range   CA 19-9 7 0 - 35 U/mL    Comment: (NOTE) [employer] Electrochemiluminescence Immunoassay (ECLIA) Values obtained with different assay methods or kits cannot be used interchangeably.  Results cannot be interpreted as absolute evidence of the presence or absence of malignant disease. Performed At: [facility] [address] [place], [place] [id number] Pearlean Botts MD IR:[id number]     Assessment/Plan:  Thoracic aortic aneurysm [facility]) On a recent CT scan  of the chest abdomen pelvis, there was mention of a 4.4 to 4.5 cm ascending thoracic aortic aneurysm.  I have independently reviewed the scan and agree with that measurement.  Going back to her CT scan in September 2021, this actually measured 4.4 cm at that time.  This is encouraging and that there has been minimal growth over several years.  This would generally be followed with annual CT scans.  She gets regular CT scan for her oncologic issues, and these are certainly usable to follow the aneurysm as well.  I will plan to see  her back in 1 year and I can review her studies and determine if further evaluation or treatment is necessary.  Continued abstinence from tobacco and control of blood pressure are important for avoiding growth of this aneurysm.  Primary pancreatic cancer [facility]) Follows with oncology.  Gets regular CT scans for evaluation.  Port-A-Cath in place I placed this about 4 years ago.  The cancer center takes care of flushing and routine maintenance.  This is still working.      Mikki Alexander 10/18/2023, 2:48 PM   This note was created with Dragon medical transcription system.  Any errors from dictation are unintentional.

## 2023-10-18 NOTE — Assessment & Plan Note (Signed)
 I placed this about 4 years ago.  The cancer center takes care of flushing and routine maintenance.  This is still working.

## 2023-10-18 NOTE — Patient Instructions (Signed)
Thoracic Aortic Aneurysm  An aneurysm is a bulge in an artery. It happens when blood pushes against a weak or damaged artery wall. A thoracic aortic aneurysm (TAA) happens in the upper part of the aorta, between the heart and the diaphragm. The aorta is the largest artery of the body. It supplies blood from the heart to the rest of the body. Some aneurysms may not cause symptoms. But a TAA can cause two serious problems: It can grow and then burst (rupture). It can cause blood to flow between the layers of the wall of the aorta through a tear (aortic dissection). These problems are medical emergencies. They can cause internal bleeding and should be treated right away. What are the causes? A TAA may be caused by: Cystic medial degeneration. This is when the fibers of the wall of the aorta become weak and expand. A genetic disease that weakens body tissue. This includes Marfan syndrome. In some cases, the cause is not known. What increases the risk? You may be more likely to have an aneurysm if: You are 68 years of age or older. You have a family history of aneurysms. You use or have used nicotine or tobacco products. You have: Arteriosclerosis. This is when your arteries harden. Arteritis. This is inflammation of the walls of an artery. An injury or trauma to your aorta. High blood pressure (hypertension). High cholesterol. Bicuspid aortic valve. This is when only two flaps (valves) over your aorta are working instead of three. This can make it harder for your heart to pump blood. What are the signs or symptoms? Symptoms depend on how big the aneurysm is and how fast it is growing. Most grow slowly and do not cause symptoms. In some cases, you may have: Pain in your chest, back, sides, or abdomen. If the pain is sudden and severe, it may mean that the aneurysm has ruptured. A cough or hoarseness. Shortness of breath. Trouble swallowing. Swelling in your face, arms, or  legs. Fainting. How is this diagnosed? This condition may be diagnosed based on: Ultrasound. X-rays. CT scan. MRI. Lab tests. Angiogram. This test checks your arteries for damage or blockage. Many aneurysms are found during exams for other conditions. How is this treated? Treatment depends on: The size of the aneurysm. How fast it is growing. Your age. Risk factors for rupture. If your aneurysm is smaller than 2.2 inches (5.5 cm), you may need: Medicines to: Control blood pressure. Treat pain. Fight infection. Control cholesterol. An ultrasound or CT scan every 6 months or year to see if the aneurysm is getting bigger. If your aneurysm is larger or growing fast, you may need surgery. Follow these instructions at home: Eating and drinking  Eat a healthy diet. You may need to: Take in less salt (sodium). Too much salt can raise your blood pressure and increase your risk for a TAA. Avoid foods that are high in saturated fat and cholesterol. These include red meat and some dairy products. Eat less sugar. Take in more fiber. You may need to eat more whole grains, vegetables, and fruit. Lifestyle Do not use any products that contain nicotine or tobacco. These products include cigarettes, chewing tobacco, and vaping devices, such as e-cigarettes. If you need help quitting, ask your health care provider. Check your blood pressure often. Follow instructions on how to keep it within normal limits. Have your blood sugar (glucose) level and cholesterol levels checked often. Exercise on a regular basis. Talk with your provider about how often  to exercise and which types of exercise are best for you. Maintain a healthy weight. Alcohol use Do not drink alcohol if: Your provider tells you not to drink. You are pregnant, may be pregnant, or plan to become pregnant. If you drink alcohol: Limit how much you have to: 0-1 drink a day if you are female. 0-2 drinks if you are female. Know how  much alcohol is in your drink. In the [place] one drink is one 12 oz bottle of beer (355 mL), one 5 oz glass of wine (148 mL), or one 1 oz glass of hard liquor (44 mL). General instructions Take over-the-counter and prescription medicines only as told by your provider. You may have to avoid lifting. Ask your provider how much you can safely lift. Keep all follow-up visits. Your provider will need to watch the size of your aneurysm and how fast it is growing. Contact a health care provider if: You lose weight and do not know why. You have trouble swallowing. You have a cough or hoarseness. Get help right away if: You have sudden, severe pain in your abdomen, side, or back. It may move into your chest and arms. You are short of breath. You feel light-headed, or you faint. You have any symptoms of a stroke. "BE FAST" is an easy way to remember the main warning signs of a stroke: B - Balance. Signs are dizziness, sudden trouble walking, or loss of balance. E - Eyes. Signs are trouble seeing or a sudden change in vision. F - Face. Signs are sudden weakness or numbness of the face, or the face or eyelid drooping on one side. A - Arms. Signs are weakness or numbness in an arm. This happens suddenly and usually on one side of the body. S - Speech. Signs are sudden trouble speaking, slurred speech, or trouble understanding what people say. T - Time. Time to call emergency services. Write down what time symptoms started. You have other signs of a stroke, such as: A sudden, severe headache with no known cause. Nausea or vomiting. Seizure. These symptoms may be an emergency. Get help right away. Call 911. Do not wait to see if the symptoms will go away. Do not drive yourself to the hospital. This information is not intended to replace advice given to you by your health care provider. Make sure you discuss any questions you have with your health care provider. Document Revised: 01/14/2022 Document  Reviewed: 01/14/2022 Elsevier Patient Education  2024 [employer].

## 2023-10-19 ENCOUNTER — Other Ambulatory Visit: Payer: Self-pay

## 2023-10-24 ENCOUNTER — Inpatient Hospital Stay

## 2023-10-24 ENCOUNTER — Inpatient Hospital Stay (HOSPITAL_BASED_OUTPATIENT_CLINIC_OR_DEPARTMENT_OTHER): Admitting: Oncology

## 2023-10-24 ENCOUNTER — Encounter: Payer: Self-pay | Admitting: Oncology

## 2023-10-24 VITALS — BP 166/75 | Temp 96.6°F | Resp 19

## 2023-10-24 VITALS — BP 130/79 | HR 55 | Temp 97.2°F | Resp 19 | Wt 135.5 lb

## 2023-10-24 DIAGNOSIS — G620 Drug-induced polyneuropathy: Secondary | ICD-10-CM

## 2023-10-24 DIAGNOSIS — C259 Malignant neoplasm of pancreas, unspecified: Secondary | ICD-10-CM

## 2023-10-24 DIAGNOSIS — I7121 Aneurysm of the ascending aorta, without rupture: Secondary | ICD-10-CM

## 2023-10-24 DIAGNOSIS — Z5111 Encounter for antineoplastic chemotherapy: Secondary | ICD-10-CM | POA: Diagnosis not present

## 2023-10-24 DIAGNOSIS — T451X5A Adverse effect of antineoplastic and immunosuppressive drugs, initial encounter: Secondary | ICD-10-CM

## 2023-10-24 DIAGNOSIS — K8689 Other specified diseases of pancreas: Secondary | ICD-10-CM | POA: Diagnosis not present

## 2023-10-24 LAB — CBC WITH DIFFERENTIAL (CANCER CENTER ONLY)
Abs Immature Granulocytes: 0.03 10*3/uL (ref 0.00–0.07)
Basophils Absolute: 0 10*3/uL (ref 0.0–0.1)
Basophils Relative: 1 %
Eosinophils Absolute: 0.2 10*3/uL (ref 0.0–0.5)
Eosinophils Relative: 3 %
HCT: 34.9 % — ABNORMAL LOW (ref 36.0–46.0)
Hemoglobin: 11.5 g/dL — ABNORMAL LOW (ref 12.0–15.0)
Immature Granulocytes: 1 %
Lymphocytes Relative: 25 %
Lymphs Abs: 1.3 10*3/uL (ref 0.7–4.0)
MCH: 32.6 pg (ref 26.0–34.0)
MCHC: 33 g/dL (ref 30.0–36.0)
MCV: 98.9 fL (ref 80.0–100.0)
Monocytes Absolute: 0.7 10*3/uL (ref 0.1–1.0)
Monocytes Relative: 13 %
Neutro Abs: 3 10*3/uL (ref 1.7–7.7)
Neutrophils Relative %: 57 %
Platelet Count: 186 10*3/uL (ref 150–400)
RBC: 3.53 MIL/uL — ABNORMAL LOW (ref 3.87–5.11)
RDW: 14.3 % (ref 11.5–15.5)
WBC Count: 5.3 10*3/uL (ref 4.0–10.5)
nRBC: 0 % (ref 0.0–0.2)

## 2023-10-24 LAB — CMP (CANCER CENTER ONLY)
ALT: 28 U/L (ref 0–44)
AST: 30 U/L (ref 15–41)
Albumin: 3.8 g/dL (ref 3.5–5.0)
Alkaline Phosphatase: 86 U/L (ref 38–126)
Anion gap: 8 (ref 5–15)
BUN: 15 mg/dL (ref 8–23)
CO2: 22 mmol/L (ref 22–32)
Calcium: 8.7 mg/dL — ABNORMAL LOW (ref 8.9–10.3)
Chloride: 108 mmol/L (ref 98–111)
Creatinine: 0.7 mg/dL (ref 0.44–1.00)
GFR, Estimated: >60 mL/min (ref >60)
Glucose, Bld: 87 mg/dL (ref 70–99)
Potassium: 3.9 mmol/L (ref 3.5–5.1)
Sodium: 138 mmol/L (ref 135–145)
Total Bilirubin: 0.4 mg/dL (ref 0.0–1.2)
Total Protein: 6.4 g/dL — ABNORMAL LOW (ref 6.5–8.1)

## 2023-10-24 MED ORDER — SODIUM CHLORIDE 0.9 % IV SOLN
INTRAVENOUS | Status: DC
Start: 2023-10-24 — End: 2023-10-24
  Filled 2023-10-24: qty 250

## 2023-10-24 MED ORDER — SODIUM CHLORIDE 0.9 % IV SOLN
1000.0000 mg/m2 | Freq: Once | INTRAVENOUS | Status: AC
  Administered 2023-10-24: 1710 mg via INTRAVENOUS
  Filled 2023-10-24: qty 44.97

## 2023-10-24 MED ORDER — HEPARIN SOD (PORK) LOCK FLUSH 100 UNIT/ML IV SOLN
500.0000 [IU] | Freq: Once | INTRAVENOUS | Status: AC | PRN
  Administered 2023-10-24: 500 [IU]
  Filled 2023-10-24: qty 5

## 2023-10-24 MED ORDER — PACLITAXEL PROTEIN-BOUND CHEMO INJECTION 100 MG
100.0000 mg/m2 | Freq: Once | INTRAVENOUS | Status: AC
Start: 2023-10-24 — End: 2023-10-24
  Administered 2023-10-24: 175 mg via INTRAVENOUS
  Filled 2023-10-24: qty 35

## 2023-10-24 NOTE — Assessment & Plan Note (Signed)
 Established care with vascular surgeon  Was recommended CT annually and follow up .

## 2023-10-24 NOTE — Assessment & Plan Note (Signed)
 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  Pathology proved liver metastatic disease- additional chemotherapy --> whipple procedure.ypT1b ypN0--> adjuvant Gem/Abraxane finished in August 2023--> Sept 2024 CT showed progression in lung --> Nov 2024 Wedge biopsy- Recurrent pancreatic cancer with lung metastasis.  Currently on Gemcitabine and Abraxane 1 week on 1 week off schedule due to neutropenia.  Labs are reviewed and discussed with patient. Proceed with Gemcitabine and Abraxane.

## 2023-10-24 NOTE — Assessment & Plan Note (Signed)
 Chemotherapy plan as listed above

## 2023-10-24 NOTE — Assessment & Plan Note (Signed)
 S/p  Whipple procedure..  Continue Creon, continue [id number] units TID with meals and  with her snacks

## 2023-10-24 NOTE — Assessment & Plan Note (Signed)
 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 Continue calcium supplementation, 2000 mg daily-divided in 2-3 doses IV calcium gluconate 2g if calcium <=8.5 Normal vitamin D level

## 2023-10-24 NOTE — Assessment & Plan Note (Signed)
 Mostly bothering her toes, grade 2.  She did not tolerate Cymbalta .   Stable symptom She gets acupuncture sessions which help her symptoms.

## 2023-10-24 NOTE — Progress Notes (Signed)
 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  Pathology proved liver metastatic disease- additional chemotherapy --> whipple procedure.ypT1b ypN0--> adjuvant Gem/Abraxane  finished in August 2023--> Sept 2024 CT showed progression in lung --> Nov 2024 Wedge biopsy- Recurrent pancreatic cancer with lung metastasis.  Currently on Gemcitabine  and Abraxane  1 week on 1 week off schedule due to neutropenia.  Labs are reviewed and discussed with patient.  Proceed with Gemcitabine  and Abraxane .     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  She did not tolerate Cymbalta .   Stable symptom She gets acupuncture sessions which help her symptoms.   Encounter for antineoplastic chemotherapy Chemotherapy plan as listed above  Hypocalcemia Likely due to malabsorption. Continue calcium  supplementation, 2000 mg daily-divided in 2-3 doses IV calcium  gluconate 2g if calcium  <=8.5 Normal vitamin D  level   Pancreatic insufficiency S/p  Whipple procedure..  Continue Creon , continue 24000 units TID with meals and  with her snacks   Thoracic aortic aneurysm [facility]) Established care with vascular surgeon  Was recommended CT annually and follow up .   No orders of the defined types were placed in this encounter.   Follow up  2 weeks lab MD Gemcitaibine Abraxane     All questions were answered. The patient knows to call the clinic with any problems,  questions or concerns.  [name], MD, PhD [facility] Hematology Oncology 10/24/2023   [name], MD    HISTORY OF PRESENTING ILLNESS:   Cassandra [name] is a  68 y.o.  female with presents for follow up of Stage IV pancreatic adenocarcinoma Patient initially presented with jaundice, transaminitis, bilirubin was 9.9.  CA 19-9 was 1874.  Patient also reports unintentional weight loss. 02/08/2020 MRI abdomen and MRCP with and without contrast was done at [facility] which showed pancreatic head mass measuring up to 3 cm, with marked associated narrowing of the portal vein confluence.  SMA is preserved.  Marked intrahepatic and extrahepatic biliary duct dilatation as well as mild dilatation of the main pancreatic duct. Multiple hepatic masses highly concerning for metastatic disease.  Patient underwent EUS on 02/19/2020, which showed irregular mass identified in the pancreatic head, hypoechoic, measured 63mmx33mm, sonographic evidence concerning for invasion into the superior mesenteric artery.  There is no sign of significant abnormality in the main pancreatic duct.  Dilatation of common bile duct which measured up to 16 mm.  Region of celiac artery was visualized and showed no signs of significant abnormality.  No lymphadenopathy.  FNA showed adenocarcinoma.  02/19/2020, ERCP, malignant.  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  The stricture was treated with placement of wall flex metal stent.  Patient was seen by [facility] oncology Dr. [name] and was recommended for 3 drug regimen FOLFIRINOX.  Patient prefers to do chemotherapy locally at [facility].  Patient was referred to establish care today. She denies any pain.  Since stent placement, skin jaundice has improved.  Itchiness has also improved. Patient was accompanied by her husband today.  She has  a family history of breast cancer in sister and paternal aunt, colon cancer paternal grandmother.  #No  reportable targetable mutation on NGS 9/14/2021cycle 1 FOLFIRINOX.  Patient received oxaliplatin  and about 50% of Irinotecan  on day 1 and had experienced neurologic symptoms.  She went to ER and working diagnosis is TIA, and eventually I think this is due to irinotecan  side effects -irinotecan -associated dysarthria, lip/tongue numbness.  Adjustment was made for Irinotecan  to be  infused over 180 minutes.  Atropine  0.5 mg once prior to the irinotecan . No recurrent symptoms.   # 03/19/2020-03/21/2020 patient was admitted due to sepsis with strep pneumonia bacteremia.  Patient was treated with IV Rocephin .  TEE was done which showed no vegetation.  No PFO or ASD.  Patient was discharged home and he finished full course of 14 days of IV Rocephin  on 04/02/2020 per ID recommendation.  Repeat blood culture was also negative.  08/25/20 cycle 10 FOLFIRINOX 09/08/20- present  Starting cycle 11, FOLFIRI, Oxaliplatin  discontinued due to neuropathy   #NGS showed no reportable targetable mutation #Genetic testing-[employer] diagnostic testing showed no pathological variants identified. MS stable, TMB 11mut/mb, KRAS G12D, SF3B1 K700E, TP53 V248fs*30  #01/14/2021  CT done at [facility] during the interval, stable disease.  # Her case was presented by Dr.Jia from [facility] tumor board. Susann Eon were 4~5 liver lesions on OSH MRI last year at the time of diagnosis highly suspicious for metastasis. She is not eligible for surgical protocol for metastasis resection given the number of liver metastasis is >3. However given her excellent and durable response to chemo, surgical resection may be considered pending sustained disease control at 1 year and may require partial hepatectomy first to see if there is viable tumor before proceeding to Whipple resection ]  Patient had a COVID-19 infection in September 2022.  03/23/2021 CT abdomen pelvis No significant change in the ill-defined pancreatic head mass or  associated biliary ductal and  pancreatic ductal dilation.  Slight interval enlargement of subcentimeter anterior peripancreatic  lymph node, nonspecific. Attention on follow-up per clinical protocol. Similar enlargement of the ascending thoracic aorta measuring 4.4 cm  03/23/2021 MRI abdomen w and wo  Increasing ill-defined hypoenhancement of the pancreatic head surrounding the common bile duct stent, in keeping with known pancreatic malignancy. Anterior peripancreatic lymph node is better evaluated on CT.  Unchanged position of a common bile duct stent with similar mild diffuse intrahepatic ductal dilation and pneumobilia. No evidence of metastatic disease in the abdomen or pelvis. Partially visualized cystic left adnexal lesion measuring up to 3.9 cm.   04/10/2021, patient underwent liver resection at [facility], by Dr. Zani.   Pathology report from [facility] was reviewed. Segments 3 partial hepatectomy, negative for viable tumor. Section 5/6 partial hepatectomy part 1 and 2, microscopic foci of residual viable adenocarcinoma, morphologically consistent with history of pancreatic primary.  Parenchymal margin is uninvolved.  #Patient resumed on FOLFIRI on 04/28/2021. She got another cycle on 05/12/2021 #05/26/2021, patient did not get additional chemotherapy due to transaminitis.  Shared decision was made to stop FOLFIRI and switch to gemcitabine  Abraxane  treatments.  05/26/2021, patient developed transaminitis, AST 763, ALT 691, alkaline phosphatase 364.  Bilirubin 0.7 05/26/2021 stat ultrasound abdomen right upper quadrant showed interval development of 3.3 x 1.8 cm complex mass in the right hepatic lobe.  Increased extrahepatic ductal dilatation.  Concerning for CBD obstruction. 05/29/2021, MRI abdomen MRCP with and without contrast showed unchanged pancreatic head soft tissue.  Severe intra and extrahepatic biliary ductal dilatation.  Common bile duct  stents remain in position however patency is not established.  No evidence of  lymphadenopathy or metastatic disease in the abdomen.  With further communication with radiologist, addendum was added that there is internal fluid signal and no appreciable associated contrast enhancement measuring 3.4 x 2.3 cm.  This appearance is generally not consistent with metastasis and is of uncertain significance.  Possibly reflecting hepatic abscess or residual of subcapsular hematoma.  Patient was seen by [facility] Dr. Barbara Levins team.  He had ERCP done on 06/05/2021, with findings of One stent from the biliary tree was seen in the major papilla. The stent had migrated significantly into the duodenum. This is the cause of stent malfunction. One stent was removed from the biliary tree. Prior biliary sphincterotomy appeared open. - A single severe biliary stricture was found in the lower third of the main bile duct with upstream dilation. The stricture was alignant appearing.- The biliary tree was swept and sludge was found.- One fully covered metal stent was placed into the common bile duct across the stricture.- No pancreatogram performed.  06/12/2021-08/10/2021 gemcitabine  and Abraxane .   10/14/2021, patient underwent Whipple procedure. Liver biopsy negative for malignancy. Review procedure showed invasive adenocarcinoma, moderately differentiated, centered in pancreatic head and confined in the Pancreas. Surgical margin is negative for malignancy.  36 lymph nodes were all negative for malignancy.  1 hepatic artery lymph node was harvested and was negative.  Gallbladder negative for malignancy.  Cystic duct excision negative for malignancy. pT1b pN0  10/25/2021, CT abdomen pelvis with contrast showed rim-enhancing fluid collection in the region of the hepatic hilum. Patient had a readmission for superficial wound infection.  She completed antibiotics on 11/03/2021.  11/03/2021, CT abdomen pelvis with contrast showed near resolution of previously seen fluid collection in the region of the hepatic hilum.   Similar appearance of the area of hypoenhancement in the liver.  Attention on follow-up.  11/17/2021, resumed on gemcitabine /Abraxane . 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  12/12/ 2023 CT scan at [facility] shows no evidence of cancer recurrence.-NED 09/21/2022 CT chest abdomen pelvis w contrast at [facility] showed-No evidence of metastatic disease in the chest, abdomen, or pelvis. 12/21/2022 CT chest abdomen pelvis w contrast at [facility] showed-No evidence of metastatic disease in the chest, abdomen, or pelvis.   03/22/2023 CT scan at [facility] showed enlarging lung nodules. RLL 1cm, LLL 0.5cm, RUL 0.5cm Stable hypoattenuating right inferior hepatic lobe lesion measuring 0.9 cm.  Similar left adnexal cystic lesion measuring up to 5.0 cm.   04/06/2023 s/p lung nodule biopsy.  Rare atypical epithelioid cells present with evidence suggestive of invasion, suspicious but not diagnostic for malignancy.   05/20/2023: Right robot assisted thoracoscopic wedge resection with MLND (Dr. Sandford Croon)   Pathology:  Suspicious for malignancy but not diagnostic A. Lymph node, level 8, biopsy: Metastatic carcinoma involving one lymph node (1/1).  B. Lung, right lower lobe, wedge resection: Lung tissue positive for adenocarcinoma, consistent with metastasis from pancreas primary. See comment. Tumor size: 1.0 cm. Operative margin: Negative  Comment: Note is made of the patient's history of invasive adenocarcinoma of the pancreatic head ([id number]; 10/14/2021), which is compared to the current case. Comparison is challenging given the post-treatment nature of the pancreatic tumor; however, there are some morphologic similarities.  Although often unhelpful in the setting of lung primary versus pancreatic primary, immunohistochemical stains were performed. Tumor cells demonstrate the below immunoprofile which would could support pulmonary adenocarcinoma with enteric differentiation but would also be consistent with metastasis from  the patient's known pancreatic primary; the latter is favored.              Positive                             Negative CK7 (strong, diffuse) TTF-1  CK20 (strong, focal)                    Positive and negative controls perform appropriately.    C. Lymph node, level 7, biopsy: Metastatic carcinoma involving one lymph node (1/1).  D. Lymph node, level 4R, biopsy: One lymph node, negative for malignancy (0/1).   INTERVAL HISTORY Cassandra [name] is a 68 y.o. female who has above history reviewed by me today presents for stage IV pancreatic cancer.   She tolerates Gemcitabine  Abraxane  1 week on and 1 week off, overall she tolerates well.  She feels tired after chemotherapy. 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  She gets acupuncture session which helped her symptoms.   She takes calcium  supplementation as instructed.  She take Creon  for pancreatic insufficiency.    Review of Systems  Constitutional:  Positive for fatigue. Negative for appetite change, chills, fever and unexpected weight change.  HENT:   Negative for hearing loss and voice change.   Eyes:  Negative for eye problems.  Respiratory:  Negative for chest tightness and cough.   Cardiovascular:  Negative for chest pain.  Gastrointestinal:  Negative for abdominal distention, abdominal pain, blood in stool and nausea.  Endocrine: Negative for hot flashes.  Genitourinary:  Negative for difficulty urinating and frequency.   Musculoskeletal:  Negative for arthralgias.       Leg cramps  Skin:  Negative for itching and rash.  Neurological:  Positive for numbness. Negative for extremity weakness.  Hematological:  Negative for adenopathy.  Psychiatric/Behavioral:  Negative for confusion.     MEDICAL HISTORY:  Past Medical History:  Diagnosis Date   Allergy    Anemia    Bacteremia due to Streptococcus pneumoniae 03/19/2020   Cancer [facility])    pancreatic cancer   Colon  polyps    Family history of breast cancer    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   Neutropenia (HCC) 04/18/2020   Osteopenia after menopause 05/2017   femoral neck T score -2.0   Personal history of chemotherapy    current for pancreatic ca   PONV (postoperative nausea and vomiting)    Pure hypercholesterolemia    Sepsis (HCC) 03/19/2020    SURGICAL HISTORY: Past Surgical History:  Procedure Laterality Date   COLONOSCOPY  07/2015   WNL   COLONOSCOPY  2008/2011   COLONOSCOPY WITH PROPOFOL  N/A 07/10/2021   Procedure: COLONOSCOPY WITH PROPOFOL ;  Surgeon: Shane Darling, MD;  Location: [facility] ENDOSCOPY;  Service: Endoscopy;  Laterality: N/A;   IR CV LINE INJECTION  04/28/2021   OVARIAN CYST REMOVAL  1992   dermoid-Dr CAK   PORTA CATH INSERTION N/A 03/07/2020   Procedure: PORTA CATH INSERTION;  Surgeon: Celso College, MD;  Location: [facility] INVASIVE CV LAB;  Service: Cardiovascular;  Laterality: N/A;   TEE WITHOUT CARDIOVERSION N/A 03/21/2020   Procedure: TRANSESOPHAGEAL ECHOCARDIOGRAM (TEE);  Surgeon: Cherrie Cornwall, MD;  Location: [facility] ORS;  Service: Cardiovascular;  Laterality: N/A;   TUBAL LIGATION  1993    SOCIAL HISTORY: Social History   Socioeconomic History  Marital status: Married    Spouse name: Not on file   Number of children: 2   Years of education: Not on file   Highest education level: Bachelor's degree (e.g., BA, AB, BS)  Occupational History   Occupation: Runner, broadcasting/film/video    Comment: retired   Occupation: Visual merchandiser  Tobacco Use   Smoking status: Former    Current packs/day: 0.00    Average packs/day: 1 pack/day for 10.0 years (10.0 ttl pk-yrs)    Types: Cigarettes    Start date: 06/29/1975    Quit date: 06/28/1985    Years since quitting: 38.3   Smokeless tobacco: Never   Tobacco comments:    Quit smoking 1987  Vaping Use   Vaping status: Never Used  Substance and Sexual Activity   Alcohol use: Yes    Alcohol/week: 5.0 standard drinks of alcohol     Types: 5 Glasses of wine per week    Comment: 0-2 mixed drinks a day   Drug use: No   Sexual activity: Not Currently    Birth control/protection: Post-menopausal  Other Topics Concern   Not on file  Social History Narrative   Not on file   Social Drivers of Health   Financial Resource Strain: Low Risk  (10/18/2023)   Overall Financial Resource Strain (CARDIA)    Difficulty of Paying Living Expenses: Not hard at all  Food Insecurity: No Food Insecurity (10/18/2023)   Hunger Vital Sign    Worried About Running Out of Food in the Last Year: Never true    Ran Out of Food in the Last Year: Never true  Transportation Needs: No Transportation Needs (10/18/2023)   PRAPARE - Administrator, Civil Service (Medical): No    Lack of Transportation (Non-Medical): No  Physical Activity: Sufficiently Active (10/18/2023)   Exercise Vital Sign    Days of Exercise per Week: 6 days    Minutes of Exercise per Session: 30 min  Stress: No Stress Concern Present (10/18/2023)   [employer] of Occupational Health - Occupational Stress Questionnaire    Feeling of Stress : Not at all  Social Connections: Moderately Integrated (10/18/2023)   Social Connection and Isolation Panel [NHANES]    Frequency of Communication with Friends and Family: More than three times a week    Frequency of Social Gatherings with Friends and Family: More than three times a week    Attends Religious Services: More than 4 times per year    Active Member of [employer] or Organizations: No    Attends Banker Meetings: Never    Marital Status: Married  Catering manager Violence: Not At Risk (10/18/2023)   Humiliation, Afraid, Rape, and Kick questionnaire    Fear of Current or Ex-Partner: No    Emotionally Abused: No    Physically Abused: No    Sexually Abused: No    FAMILY HISTORY: Family History  Problem Relation Age of Onset   Breast cancer Paternal Aunt 66   Diabetes Mother    Osteoporosis Mother     Hyperlipidemia Father    Rheumatic fever Father    Valvular heart disease Father    Cancer Paternal Grandmother        possible colon   Breast cancer Sister 78    ALLERGIES:  is allergic to penicillin g.  MEDICATIONS:  Current Outpatient Medications  Medication Sig Dispense Refill   aspirin  81 MG chewable tablet Chew 81 mg by mouth daily.     b complex vitamins capsule  Take 1 capsule by mouth daily.     Calcium  Carbonate-Vit D-Min (CALCIUM  1200 PO) Take by mouth.     Cholecalciferol  25 MCG (1000 UT) tablet Take 2,000 Units by mouth daily.     clobetasol  cream (TEMOVATE ) 0.05 %      fluticasone (FLONASE) 50 MCG/ACT nasal spray Place 1 spray into both nostrils daily.     lidocaine -prilocaine  (EMLA ) cream Apply small amount to port and cover with saran wrap 1-2 hours prior to port access 30 g 6   loratadine (CLARITIN REDITABS) 10 MG dissolvable tablet Take 10 mg by mouth daily.     Melatonin 2.5 MG CHEW Chew by mouth.     Multiple Vitamins-Minerals (MULTIVITAMIN WITH MINERALS) tablet Take 1 tablet by mouth daily.     Pancrelipase , Lip-Prot-Amyl, (CREON ) 24000-76000 units CPEP TAKE 1 CAPSULE THREE TIMES DAILY BEFORE MEALS 200 capsule 3   senna-docusate (SENOKOT-S) 8.6-50 MG tablet Take by mouth.     simethicone  (GAS-X) 80 MG chewable tablet Chew 1 tablet (80 mg total) by mouth every 8 (eight) hours as needed for flatulence. 60 tablet 0   prochlorperazine  (COMPAZINE ) 10 MG tablet Take 10 mg by mouth every 6 (six) hours as needed for nausea or vomiting.     No current facility-administered medications for this visit.   Facility-Administered Medications Ordered in Other Visits  Medication Dose Route Frequency Provider Last Rate Last Admin   0.9 %  sodium chloride  infusion   Intravenous Continuous [name], MD 10 mL/hr at 09/12/23 1241 Infusion Verify at 09/12/23 1241   0.9 %  sodium chloride  infusion   Intravenous Continuous [name], MD 10 mL/hr at 10/24/23 0910 New Bag at 10/24/23 0910    calcium  gluconate 2 g in sodium chloride  0.9 % 100 mL IVPB  2 g Intravenous PRN [name], MD   Stopped at 09/12/23 1101   gemcitabine  (GEMZAR ) 1,710 mg in sodium chloride  0.9 % 250 mL chemo infusion  1,000 mg/m2 (Order-Specific) Intravenous Once [name], MD       heparin  lock flush 100 UNIT/ML injection            PACLitaxel -protein bound (ABRAXANE ) chemo infusion 175 mg  100 mg/m2 (Order-Specific) Intravenous Once Jakaila Norment, MD 70 mL/hr at 10/24/23 1018 175 mg at 10/24/23 1018   prochlorperazine  (COMPAZINE ) 10 MG tablet            sodium chloride  flush (NS) 0.9 % injection 10 mL  10 mL Intravenous PRN [name], MD   10 mL at 02/18/21 0858   sodium chloride  flush (NS) 0.9 % injection 10 mL  10 mL Intravenous Once Borders, Carlene Che, NP       sodium chloride  flush (NS) 0.9 % injection 10 mL  10 mL Intravenous Once [name], MD         PHYSICAL EXAMINATION: ECOG PERFORMANCE STATUS: 0 - Asymptomatic Vitals:   10/24/23 0837  BP: 130/79  Pulse: (!) 55  Resp: 19  Temp: (!) 97.2 F (36.2 C)  SpO2: 99%   Filed Weights   10/24/23 0837  Weight: 135 lb 8 oz (61.5 kg)     Physical Exam Constitutional:      General: She is not in acute distress. HENT:     Head: Normocephalic and atraumatic.  Eyes:     General: No scleral icterus. Cardiovascular:     Rate and Rhythm: Normal rate and regular rhythm.     Heart sounds: Normal heart sounds.  Pulmonary:  Effort: Pulmonary effort is normal. No respiratory distress.  Abdominal:     General: Bowel sounds are normal. There is no distension.     Palpations: Abdomen is soft.  Musculoskeletal:        General: No deformity. Normal range of motion.     Cervical back: Normal range of motion and neck supple.  Skin:    General: Skin is warm.     Coloration: Skin is not jaundiced.     Findings: No erythema.  Neurological:     Mental Status: She is alert and oriented to person, place, and time. Mental status is at baseline.  Psychiatric:         Mood and Affect: Mood normal.     LABORATORY DATA:  I have reviewed the data as listed    Latest Ref Rng & Units 10/24/2023    8:06 AM 10/10/2023   12:45 PM 09/26/2023    8:26 AM  CBC  WBC 4.0 - 10.5 K/uL 5.3  6.5  4.5   Hemoglobin 12.0 - 15.0 g/dL 82.9  56.2  13.0   Hematocrit 36.0 - 46.0 % 34.9  35.2  34.3   Platelets 150 - 400 K/uL 186  180  172       Latest Ref Rng & Units 10/24/2023    8:06 AM 10/10/2023   12:45 PM 09/26/2023    8:26 AM  CMP  Glucose 70 - 99 mg/dL 87  865  99   BUN 8 - 23 mg/dL 15  17  13    Creatinine 0.44 - 1.00 mg/dL 7.84  6.96  2.95   Sodium 135 - 145 mmol/L 138  138  141   Potassium 3.5 - 5.1 mmol/L 3.9  3.7  3.9   Chloride 98 - 111 mmol/L 108  106  111   CO2 22 - 32 mmol/L 22  23  24    Calcium  8.9 - 10.3 mg/dL 8.7  8.8  8.6   Total Protein 6.5 - 8.1 g/dL 6.4  6.6  6.3   Total Bilirubin 0.0 - 1.2 mg/dL 0.4  0.6  0.7   Alkaline Phos 38 - 126 U/L 86  83  77   AST 15 - 41 U/L 30  28  32   ALT 0 - 44 U/L 28  25  28       RADIOGRAPHIC STUDIES: I have personally reviewed the radiological images as listed and agreed with the findings in the report. Reviewed findings of MRI abdomen MRCP done at [facility]. CT CHEST ABDOMEN PELVIS W CONTRAST Result Date: 10/09/2023 CLINICAL DATA:  Pancreatic cancer restaging, ongoing chemotherapy status post Whipple procedure * Tracking Code: BO * EXAM: CT CHEST, ABDOMEN, AND PELVIS WITH CONTRAST TECHNIQUE: Multidetector CT imaging of the chest, abdomen and pelvis was performed following the standard protocol during bolus administration of intravenous contrast. RADIATION DOSE REDUCTION: This exam was performed according to the departmental dose-optimization program which includes automated exposure control, adjustment of the mA and/or kV according to patient size and/or use of iterative reconstruction technique. CONTRAST:  OMNIPAQUE  IOHEXOL  300 MG/ML  SOLN COMPARISON:  06/27/2023 FINDINGS: CT CHEST FINDINGS Cardiovascular:  Unchanged enlargement of the tubular ascending thoracic aorta measuring up to 4.5 x 4.4 cm (series 2, image 31). Scattered aortic atherosclerosis normal heart size. No pericardial effusion. Mediastinum/Nodes: No enlarged mediastinal, hilar, or axillary lymph nodes. Thyroid gland, trachea, and esophagus demonstrate no significant findings. Lungs/Pleura: Bandlike scarring of the right lung base, unchanged. No significant change in an  irregular nodule of the dependent left upper lobe measuring 0.6 cm (series 4, image 41). No significant change in adjacent nodules of the dependent left lower lobe, a cavitary nodule measuring 0.9 x 0.7 cm (series 4, image 77) and an adjacent solid nodule measuring 0.5 cm (series 4, image 72). Unchanged small nodule of the anterior inferior right upper lobe measuring 0.5 cm (series 4, image 71). No new nodules. No pleural effusion or pneumothorax. Musculoskeletal: No chest wall abnormality. No acute osseous findings. CT ABDOMEN PELVIS FINDINGS Hepatobiliary: Unchanged, very subtle hypodense lesion of the inferior margin of hepatic segment V adjacent to the gallbladder fossa measuring 0.8 cm (series 2, image 70). Status post cholecystectomy and hepatojejunostomy. No biliary dilatation. Pancreas: Status post Whipple pancreaticoduodenectomy. Diminished size and solid character of soft tissue at the right aspect of the central superior mesenteric vein, on today's examination measuring 1.0 x 0.8 cm, previously 1.3 x 1.1 cm (series 2, image 75). No pancreatic ductal dilatation or surrounding inflammatory changes. Spleen: Normal in size without significant abnormality. Adrenals/Urinary Tract: Adrenal glands are unremarkable. Kidneys are normal, without renal calculi, solid lesion, or hydronephrosis. Bladder is unremarkable. Stomach/Bowel: Status post distal gastrectomy and gastrojejunostomy. Appendix appears normal. No evidence of bowel wall thickening, distention, or inflammatory changes.  Vascular/Lymphatic: Aortic atherosclerosis. No enlarged abdominal or pelvic lymph nodes. Reproductive: No mass. Unchanged left ovarian cyst measuring 5.1 x 3.1 cm (series 2, image 102). Other: No abdominal wall hernia or abnormality. No ascites. Musculoskeletal: No acute osseous findings. IMPRESSION: 1. Status post Whipple pancreaticoduodenectomy. Diminished size and solid character of soft tissue at the right aspect of the central superior mesenteric vein, on today's examination measuring 1.0 x 0.8 cm, previously 1.3 x 1.1 cm which remains highly suspicious for local recurrence. 2. Unchanged, very subtle hypodense lesion of the inferior margin of hepatic segment V adjacent to the gallbladder fossa measuring 0.8 cm. Continued attention on follow-up. 3. No significant change in small bilateral pulmonary nodules, highly suspicious for small pulmonary metastases. 4. Unchanged left ovarian cyst measuring 5.1 x 3.1 cm. Although abnormal in the late postmenopausal setting, this is probably benign and attention on follow-up is sufficient in the setting of known malignancy. 5. Unchanged enlargement of the tubular ascending thoracic aorta measuring up to 4.5 x 4.4 cm. Ascending thoracic aortic aneurysm. Attention on follow-up in the setting of known primary malignancy. Aortic Atherosclerosis (ICD10-I70.0). Electronically Signed   By: Fredricka Jenny M.D.   On: 10/09/2023 16:57

## 2023-10-26 ENCOUNTER — Encounter: Payer: Self-pay | Admitting: Oncology

## 2023-11-02 ENCOUNTER — Encounter: Payer: Self-pay | Admitting: Oncology

## 2023-11-02 NOTE — Progress Notes (Signed)
See media for full report.

## 2023-11-07 ENCOUNTER — Ambulatory Visit

## 2023-11-07 ENCOUNTER — Ambulatory Visit: Admitting: Oncology

## 2023-11-07 ENCOUNTER — Other Ambulatory Visit

## 2023-11-07 ENCOUNTER — Inpatient Hospital Stay (HOSPITAL_BASED_OUTPATIENT_CLINIC_OR_DEPARTMENT_OTHER): Attending: Oncology | Admitting: Oncology

## 2023-11-07 ENCOUNTER — Inpatient Hospital Stay: Attending: Oncology

## 2023-11-07 ENCOUNTER — Inpatient Hospital Stay

## 2023-11-07 ENCOUNTER — Encounter: Payer: Self-pay | Admitting: Oncology

## 2023-11-07 VITALS — BP 156/86 | HR 50 | Resp 18

## 2023-11-07 VITALS — BP 130/78 | HR 56 | Temp 97.4°F | Resp 20 | Wt 137.4 lb

## 2023-11-07 DIAGNOSIS — T451X5A Adverse effect of antineoplastic and immunosuppressive drugs, initial encounter: Secondary | ICD-10-CM | POA: Diagnosis not present

## 2023-11-07 DIAGNOSIS — C787 Secondary malignant neoplasm of liver and intrahepatic bile duct: Secondary | ICD-10-CM | POA: Insufficient documentation

## 2023-11-07 DIAGNOSIS — C250 Malignant neoplasm of head of pancreas: Secondary | ICD-10-CM | POA: Insufficient documentation

## 2023-11-07 DIAGNOSIS — C7800 Secondary malignant neoplasm of unspecified lung: Secondary | ICD-10-CM | POA: Diagnosis not present

## 2023-11-07 DIAGNOSIS — C259 Malignant neoplasm of pancreas, unspecified: Secondary | ICD-10-CM | POA: Diagnosis not present

## 2023-11-07 DIAGNOSIS — Z87891 Personal history of nicotine dependence: Secondary | ICD-10-CM | POA: Insufficient documentation

## 2023-11-07 DIAGNOSIS — G620 Drug-induced polyneuropathy: Secondary | ICD-10-CM | POA: Diagnosis not present

## 2023-11-07 DIAGNOSIS — Z5111 Encounter for antineoplastic chemotherapy: Secondary | ICD-10-CM | POA: Insufficient documentation

## 2023-11-07 LAB — CBC WITH DIFFERENTIAL (CANCER CENTER ONLY)
Abs Immature Granulocytes: 0.02 10*3/uL (ref 0.00–0.07)
Basophils Absolute: 0 10*3/uL (ref 0.0–0.1)
Basophils Relative: 1 %
Eosinophils Absolute: 0.1 10*3/uL (ref 0.0–0.5)
Eosinophils Relative: 2 %
HCT: 34.5 % — ABNORMAL LOW (ref 36.0–46.0)
Hemoglobin: 11.6 g/dL — ABNORMAL LOW (ref 12.0–15.0)
Immature Granulocytes: 0 %
Lymphocytes Relative: 29 %
Lymphs Abs: 1.6 10*3/uL (ref 0.7–4.0)
MCH: 32.6 pg (ref 26.0–34.0)
MCHC: 33.6 g/dL (ref 30.0–36.0)
MCV: 96.9 fL (ref 80.0–100.0)
Monocytes Absolute: 0.6 10*3/uL (ref 0.1–1.0)
Monocytes Relative: 10 %
Neutro Abs: 3.2 10*3/uL (ref 1.7–7.7)
Neutrophils Relative %: 58 %
Platelet Count: 176 10*3/uL (ref 150–400)
RBC: 3.56 MIL/uL — ABNORMAL LOW (ref 3.87–5.11)
RDW: 14.6 % (ref 11.5–15.5)
WBC Count: 5.6 10*3/uL (ref 4.0–10.5)
nRBC: 0 % (ref 0.0–0.2)

## 2023-11-07 LAB — CMP (CANCER CENTER ONLY)
ALT: 29 U/L (ref 0–44)
AST: 31 U/L (ref 15–41)
Albumin: 4.1 g/dL (ref 3.5–5.0)
Alkaline Phosphatase: 81 U/L (ref 38–126)
Anion gap: 9 (ref 5–15)
BUN: 17 mg/dL (ref 8–23)
CO2: 24 mmol/L (ref 22–32)
Calcium: 8.7 mg/dL — ABNORMAL LOW (ref 8.9–10.3)
Chloride: 106 mmol/L (ref 98–111)
Creatinine: 0.65 mg/dL (ref 0.44–1.00)
GFR, Estimated: >60 mL/min (ref >60)
Glucose, Bld: 157 mg/dL — ABNORMAL HIGH (ref 70–99)
Potassium: 3.7 mmol/L (ref 3.5–5.1)
Sodium: 139 mmol/L (ref 135–145)
Total Bilirubin: 0.7 mg/dL (ref 0.0–1.2)
Total Protein: 6.5 g/dL (ref 6.5–8.1)

## 2023-11-07 MED ORDER — PACLITAXEL PROTEIN-BOUND CHEMO INJECTION 100 MG
100.0000 mg/m2 | Freq: Once | INTRAVENOUS | Status: AC
  Administered 2023-11-07: 175 mg via INTRAVENOUS
  Filled 2023-11-07: qty 35

## 2023-11-07 MED ORDER — SODIUM CHLORIDE 0.9 % IV SOLN
INTRAVENOUS | Status: DC
  Filled 2023-11-07: qty 250

## 2023-11-07 MED ORDER — HEPARIN SOD (PORK) LOCK FLUSH 100 UNIT/ML IV SOLN
500.0000 [IU] | Freq: Once | INTRAVENOUS | Status: AC | PRN
  Administered 2023-11-07: 500 [IU]
  Filled 2023-11-07: qty 5

## 2023-11-07 MED ORDER — SODIUM CHLORIDE 0.9 % IV SOLN
1000.0000 mg/m2 | Freq: Once | INTRAVENOUS | Status: AC
  Administered 2023-11-07: 1710 mg via INTRAVENOUS
  Filled 2023-11-07: qty 44.97

## 2023-11-07 NOTE — Progress Notes (Signed)
 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  Pathology proved liver metastatic disease- additional chemotherapy --> whipple procedure.ypT1b ypN0--> adjuvant Gem/Abraxane  finished in August 2023--> Sept 2024 CT showed progression in lung --> Nov 2024 Wedge biopsy- Recurrent pancreatic cancer with lung metastasis.  Currently on Gemcitabine  and Abraxane  1 week on 1 week off schedule due to neutropenia.  Labs are reviewed and discussed with patient.  Proceed with Gemcitabine  and Abraxane .     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  She did not tolerate Cymbalta .   Stable symptom She gets acupuncture sessions which help her symptoms.   Encounter for antineoplastic chemotherapy Chemotherapy plan as listed above  Hypocalcemia Likely due to malabsorption. Continue calcium  supplementation, 2000 mg daily-divided in 2-3 doses IV calcium  gluconate 2g if calcium  <=8.5 Normal vitamin D  level   Orders Placed This Encounter  Procedures   Cancer antigen 19-9    Standing Status:   Future    Expected Date:   12/05/2023    Expiration Date:   12/04/2024   CEA    Standing Status:   Future    Expected Date:   12/05/2023    Expiration Date:   12/04/2024   CBC with Differential (Cancer Center Only)    Standing Status:   Future    Expected Date:   12/05/2023    Expiration Date:   12/04/2024   CMP (Cancer Center only)    Standing Status:   Future    Expected  Date:   12/05/2023    Expiration Date:   12/04/2024   CBC with Differential (Cancer Center Only)    Standing Status:   Future    Expected Date:   12/19/2023    Expiration Date:   12/18/2024   CMP (Cancer Center only)    Standing Status:   Future    Expected Date:   12/19/2023    Expiration Date:   12/18/2024    Follow up  2 weeks lab MD Gemcitaibine Abraxane     All questions were answered. The patient knows to call the clinic with any problems, questions or concerns.  [name], MD, PhD [facility] Hematology Oncology 11/07/2023   [name], MD    HISTORY OF PRESENTING ILLNESS:   Cassandra [name] is a  68 y.o.  female with presents for follow up of Stage IV pancreatic adenocarcinoma Patient initially presented with jaundice, transaminitis, bilirubin was 9.9.  CA 19-9 was 1874.  Patient also reports unintentional weight loss. 02/08/2020 MRI abdomen and MRCP with and without contrast was done at [facility] which showed pancreatic head mass measuring up to 3 cm, with marked associated narrowing of the portal vein confluence.  SMA is preserved.  Marked intrahepatic and extrahepatic biliary duct dilatation as well as mild dilatation of the main pancreatic duct. Multiple hepatic masses highly concerning for metastatic disease.  Patient underwent EUS on 02/19/2020, which showed irregular mass identified in the pancreatic head, hypoechoic, measured 71mmx33mm, sonographic evidence concerning for invasion into the superior mesenteric artery.  There is no sign of significant  abnormality in the main pancreatic duct.  Dilatation of common bile duct which measured up to 16 mm.  Region of celiac artery was visualized and showed no signs of significant abnormality.  No lymphadenopathy.  FNA showed adenocarcinoma.  02/19/2020, ERCP, malignant.  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  The stricture was treated with placement of wall flex metal  stent.  Patient was seen by [facility] oncology Dr. [name] and was recommended for 3 drug regimen FOLFIRINOX.  Patient prefers to do chemotherapy locally at [facility].  Patient was referred to establish care today. She denies any pain.  Since stent placement, skin jaundice has improved.  Itchiness has also improved. Patient was accompanied by her husband today.  She has a family history of breast cancer in sister and paternal aunt, colon cancer paternal grandmother.  #No reportable targetable mutation on NGS 9/14/2021cycle 1 FOLFIRINOX.  Patient received oxaliplatin  and about 50% of Irinotecan  on day 1 and had experienced neurologic symptoms.  She went to ER and working diagnosis is TIA, and eventually I think this is due to irinotecan  side effects -irinotecan -associated dysarthria, lip/tongue numbness.  Adjustment was made for Irinotecan  to be  infused over 180 minutes.  Atropine  0.5 mg once prior to the irinotecan . No recurrent symptoms.   # 03/19/2020-03/21/2020 patient was admitted due to sepsis with strep pneumonia bacteremia.  Patient was treated with IV Rocephin .  TEE was done which showed no vegetation.  No PFO or ASD.  Patient was discharged home and he finished full course of 14 days of IV Rocephin  on 04/02/2020 per ID recommendation.  Repeat blood culture was also negative.  08/25/20 cycle 10 FOLFIRINOX 09/08/20- present  Starting cycle 11, FOLFIRI, Oxaliplatin  discontinued due to neuropathy   #NGS showed no reportable targetable mutation #Genetic testing-[employer] diagnostic testing showed no pathological variants identified. MS stable, TMB 43mut/mb, KRAS G12D, SF3B1 K700E, TP53 V235fs*30  #01/14/2021  CT done at [facility] during the interval, stable disease.  # Her case was presented by Dr.Jia from [facility] tumor board. Susann Eon were 4~5 liver lesions on OSH MRI last year at the time of diagnosis highly suspicious for metastasis. She is not eligible for surgical protocol for metastasis resection given the number  of liver metastasis is >3. However given her excellent and durable response to chemo, surgical resection may be considered pending sustained disease control at 1 year and may require partial hepatectomy first to see if there is viable tumor before proceeding to Whipple resection ]  Patient had a COVID-19 infection in September 2022.  03/23/2021 CT abdomen pelvis No significant change in the ill-defined pancreatic head mass or  associated biliary ductal and pancreatic ductal dilation.  Slight interval enlargement of subcentimeter anterior peripancreatic  lymph node, nonspecific. Attention on follow-up per clinical protocol. Similar enlargement of the ascending thoracic aorta measuring 4.4 cm  03/23/2021 MRI abdomen w and wo  Increasing ill-defined hypoenhancement of the pancreatic head surrounding the common bile duct stent, in keeping with known pancreatic malignancy. Anterior peripancreatic lymph node is better evaluated on CT.  Unchanged position of a common bile duct stent with similar mild diffuse intrahepatic ductal dilation and pneumobilia. No evidence of metastatic disease in the abdomen or pelvis. Partially visualized cystic left adnexal lesion measuring up to 3.9 cm.   04/10/2021, patient underwent liver resection at [facility], by Dr. Zani.   Pathology report from [facility] was reviewed. Segments 3 partial hepatectomy, negative for viable tumor. Section 5/6  partial hepatectomy part 1 and 2, microscopic foci of residual viable adenocarcinoma, morphologically consistent with history of pancreatic primary.  Parenchymal margin is uninvolved.  #Patient resumed on FOLFIRI on 04/28/2021. She got another cycle on 05/12/2021 #05/26/2021, patient did not get additional chemotherapy due to transaminitis.  Shared decision was made to stop FOLFIRI and switch to gemcitabine  Abraxane  treatments.  05/26/2021, patient developed transaminitis, AST 763, ALT 691, alkaline phosphatase 364.  Bilirubin 0.7 05/26/2021 stat  ultrasound abdomen right upper quadrant showed interval development of 3.3 x 1.8 cm complex mass in the right hepatic lobe.  Increased extrahepatic ductal dilatation.  Concerning for CBD obstruction. 05/29/2021, MRI abdomen MRCP with and without contrast showed unchanged pancreatic head soft tissue.  Severe intra and extrahepatic biliary ductal dilatation.  Common bile duct stents remain in position however patency is not established.  No evidence of lymphadenopathy or metastatic disease in the abdomen.  With further communication with radiologist, addendum was added that there is internal fluid signal and no appreciable associated contrast enhancement measuring 3.4 x 2.3 cm.  This appearance is generally not consistent with metastasis and is of uncertain significance.  Possibly reflecting hepatic abscess or residual of subcapsular hematoma.  Patient was seen by [facility] Dr. Barbara Levins team.  He had ERCP done on 06/05/2021, with findings of One stent from the biliary tree was seen in the major papilla. The stent had migrated significantly into the duodenum. This is the cause of stent malfunction. One stent was removed from the biliary tree. Prior biliary sphincterotomy appeared open. - A single severe biliary stricture was found in the lower third of the main bile duct with upstream dilation. The stricture was alignant appearing.- The biliary tree was swept and sludge was found.- One fully covered metal stent was placed into the common bile duct across the stricture.- No pancreatogram performed.  06/12/2021-08/10/2021 gemcitabine  and Abraxane .   10/14/2021, patient underwent Whipple procedure. Liver biopsy negative for malignancy. Review procedure showed invasive adenocarcinoma, moderately differentiated, centered in pancreatic head and confined in the Pancreas. Surgical margin is negative for malignancy.  36 lymph nodes were all negative for malignancy.  1 hepatic artery lymph node was harvested and was negative.   Gallbladder negative for malignancy.  Cystic duct excision negative for malignancy. pT1b pN0  10/25/2021, CT abdomen pelvis with contrast showed rim-enhancing fluid collection in the region of the hepatic hilum. Patient had a readmission for superficial wound infection.  She completed antibiotics on 11/03/2021.  11/03/2021, CT abdomen pelvis with contrast showed near resolution of previously seen fluid collection in the region of the hepatic hilum.  Similar appearance of the area of hypoenhancement in the liver.  Attention on follow-up.  11/17/2021, resumed on gemcitabine /Abraxane . 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  12/12/ 2023 CT scan at [facility] shows no evidence of cancer recurrence.-NED 09/21/2022 CT chest abdomen pelvis w contrast at [facility] showed-No evidence of metastatic disease in the chest, abdomen, or pelvis. 12/21/2022 CT chest abdomen pelvis w contrast at [facility] showed-No evidence of metastatic disease in the chest, abdomen, or pelvis.   03/22/2023 CT scan at [facility] showed enlarging lung nodules. RLL 1cm, LLL 0.5cm, RUL 0.5cm Stable hypoattenuating right inferior hepatic lobe lesion measuring 0.9 cm.  Similar left adnexal cystic lesion measuring up to 5.0 cm.   04/06/2023 s/p lung nodule biopsy.  Rare atypical epithelioid cells present with evidence suggestive of invasion, suspicious but not diagnostic for malignancy.   05/20/2023: Right robot assisted thoracoscopic wedge resection with MLND (Dr. Sandford Croon)  Pathology:  Suspicious for malignancy but not diagnostic A. Lymph node, level 8, biopsy: Metastatic carcinoma involving one lymph node (1/1).  B. Lung, right lower lobe, wedge resection: Lung tissue positive for adenocarcinoma, consistent with metastasis from pancreas primary. See comment. Tumor size: 1.0 cm. Operative margin: Negative  Comment: Note is made of the patient's history of invasive adenocarcinoma of the pancreatic head ([id number]; 10/14/2021), which is compared to the  current case. Comparison is challenging given the post-treatment nature of the pancreatic tumor; however, there are some morphologic similarities.  Although often unhelpful in the setting of lung primary versus pancreatic primary, immunohistochemical stains were performed. Tumor cells demonstrate the below immunoprofile which would could support pulmonary adenocarcinoma with enteric differentiation but would also be consistent with metastasis from the patient's known pancreatic primary; the latter is favored.              Positive                             Negative CK7 (strong, diffuse) TTF-1  CK20 (strong, focal)                    Positive and negative controls perform appropriately.    C. Lymph node, level 7, biopsy: Metastatic carcinoma involving one lymph node (1/1).  D. Lymph node, level 4R, biopsy: One lymph node, negative for malignancy (0/1).   INTERVAL HISTORY Cassandra [name] is a 68 y.o. female who has above history reviewed by me today presents for stage IV pancreatic cancer.   She tolerates Gemcitabine  Abraxane  1 week on and 1 week off, overall she tolerates well.  She feels tired after chemotherapy. 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  She gets acupuncture session which helped her symptoms.   She takes calcium  supplementation as instructed.  For the past 3 treatments, she experienced transit itchy spots around her chest incision site.  She take Creon  for pancreatic insufficiency.    Review of Systems  Constitutional:  Positive for fatigue. Negative for appetite change, chills, fever and unexpected weight change.  HENT:   Negative for hearing loss and voice change.   Eyes:  Negative for eye problems.  Respiratory:  Negative for chest tightness and cough.   Cardiovascular:  Negative for chest pain.  Gastrointestinal:  Negative for abdominal distention, abdominal pain, blood in stool and nausea.  Endocrine: Negative  for hot flashes.  Genitourinary:  Negative for difficulty urinating and frequency.   Musculoskeletal:  Negative for arthralgias.  Skin:  Negative for itching and rash.  Neurological:  Positive for numbness. Negative for extremity weakness.  Hematological:  Negative for adenopathy.  Psychiatric/Behavioral:  Negative for confusion.     MEDICAL HISTORY:  Past Medical History:  Diagnosis Date   Allergy    Anemia    Bacteremia due to Streptococcus pneumoniae 03/19/2020   Cancer [facility])    pancreatic cancer   Colon polyps    Family history of breast cancer    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   Neutropenia (HCC) 04/18/2020   Osteopenia after menopause 05/2017   femoral neck T score -2.0   Personal history of chemotherapy    current for pancreatic ca   PONV (postoperative nausea and vomiting)    Pure hypercholesterolemia    Sepsis (HCC) 03/19/2020    SURGICAL HISTORY: Past Surgical History:  Procedure Laterality  Date   COLONOSCOPY  07/2015   WNL   COLONOSCOPY  2008/2011   COLONOSCOPY WITH PROPOFOL  N/A 07/10/2021   Procedure: COLONOSCOPY WITH PROPOFOL ;  Surgeon: Shane Darling, MD;  Location: [facility] ENDOSCOPY;  Service: Endoscopy;  Laterality: N/A;   IR CV LINE INJECTION  04/28/2021   OVARIAN CYST REMOVAL  1992   dermoid-Dr CAK   PORTA CATH INSERTION N/A 03/07/2020   Procedure: PORTA CATH INSERTION;  Surgeon: Celso College, MD;  Location: [facility] INVASIVE CV LAB;  Service: Cardiovascular;  Laterality: N/A;   TEE WITHOUT CARDIOVERSION N/A 03/21/2020   Procedure: TRANSESOPHAGEAL ECHOCARDIOGRAM (TEE);  Surgeon: Cherrie Cornwall, MD;  Location: [facility] ORS;  Service: Cardiovascular;  Laterality: N/A;   TUBAL LIGATION  1993    SOCIAL HISTORY: Social History   Socioeconomic History   Marital status: Married    Spouse name: Not on file   Number of children: 2   Years of education: Not on file   Highest education level: Bachelor's degree (e.g., BA, AB, BS)  Occupational  History   Occupation: Runner, broadcasting/film/video    Comment: retired   Occupation: Visual merchandiser  Tobacco Use   Smoking status: Former    Current packs/day: 0.00    Average packs/day: 1 pack/day for 10.0 years (10.0 ttl pk-yrs)    Types: Cigarettes    Start date: 06/29/1975    Quit date: 06/28/1985    Years since quitting: 38.3   Smokeless tobacco: Never   Tobacco comments:    Quit smoking 1987  Vaping Use   Vaping status: Never Used  Substance and Sexual Activity   Alcohol use: Yes    Alcohol/week: 5.0 standard drinks of alcohol    Types: 5 Glasses of wine per week    Comment: 0-2 mixed drinks a day   Drug use: No   Sexual activity: Not Currently    Birth control/protection: Post-menopausal  Other Topics Concern   Not on file  Social History Narrative   Not on file   Social Drivers of Health   Financial Resource Strain: Low Risk  (10/18/2023)   Overall Financial Resource Strain (CARDIA)    Difficulty of Paying Living Expenses: Not hard at all  Food Insecurity: No Food Insecurity (10/18/2023)   Hunger Vital Sign    Worried About Running Out of Food in the Last Year: Never true    Ran Out of Food in the Last Year: Never true  Transportation Needs: No Transportation Needs (10/18/2023)   PRAPARE - Administrator, Civil Service (Medical): No    Lack of Transportation (Non-Medical): No  Physical Activity: Sufficiently Active (10/18/2023)   Exercise Vital Sign    Days of Exercise per Week: 6 days    Minutes of Exercise per Session: 30 min  Stress: No Stress Concern Present (10/18/2023)   [employer] of Occupational Health - Occupational Stress Questionnaire    Feeling of Stress : Not at all  Social Connections: Moderately Integrated (10/18/2023)   Social Connection and Isolation Panel [NHANES]    Frequency of Communication with Friends and Family: More than three times a week    Frequency of Social Gatherings with Friends and Family: More than three times a week    Attends Religious  Services: More than 4 times per year    Active Member of [employer] or Organizations: No    Attends Banker Meetings: Never    Marital Status: Married  Catering manager Violence: Not At Risk (10/18/2023)   Humiliation, Afraid,  Rape, and Kick questionnaire    Fear of Current or Ex-Partner: No    Emotionally Abused: No    Physically Abused: No    Sexually Abused: No    FAMILY HISTORY: Family History  Problem Relation Age of Onset   Breast cancer Paternal Aunt 36   Diabetes Mother    Osteoporosis Mother    Hyperlipidemia Father    Rheumatic fever Father    Valvular heart disease Father    Cancer Paternal Grandmother        possible colon   Breast cancer Sister 36    ALLERGIES:  is allergic to penicillin g.  MEDICATIONS:  Current Outpatient Medications  Medication Sig Dispense Refill   aspirin  81 MG chewable tablet Chew 81 mg by mouth daily.     b complex vitamins capsule Take 1 capsule by mouth daily.     Calcium  Carbonate-Vit D-Min (CALCIUM  1200 PO) Take by mouth.     Cholecalciferol  25 MCG (1000 UT) tablet Take 2,000 Units by mouth daily.     clobetasol  cream (TEMOVATE ) 0.05 %      fluticasone (FLONASE) 50 MCG/ACT nasal spray Place 1 spray into both nostrils daily.     lidocaine -prilocaine  (EMLA ) cream Apply small amount to port and cover with saran wrap 1-2 hours prior to port access 30 g 6   loratadine (CLARITIN REDITABS) 10 MG dissolvable tablet Take 10 mg by mouth daily.     Melatonin 2.5 MG CHEW Chew by mouth.     Multiple Vitamins-Minerals (MULTIVITAMIN WITH MINERALS) tablet Take 1 tablet by mouth daily.     Pancrelipase , Lip-Prot-Amyl, (CREON ) 24000-76000 units CPEP TAKE 1 CAPSULE THREE TIMES DAILY BEFORE MEALS 200 capsule 3   prochlorperazine  (COMPAZINE ) 10 MG tablet Take 10 mg by mouth every 6 (six) hours as needed for nausea or vomiting.     senna-docusate (SENOKOT-S) 8.6-50 MG tablet Take by mouth.     simethicone  (GAS-X) 80 MG chewable tablet Chew 1  tablet (80 mg total) by mouth every 8 (eight) hours as needed for flatulence. 60 tablet 0   No current facility-administered medications for this visit.   Facility-Administered Medications Ordered in Other Visits  Medication Dose Route Frequency Provider Last Rate Last Admin   0.9 %  sodium chloride  infusion   Intravenous Continuous [name], MD 10 mL/hr at 09/12/23 1241 Infusion Verify at 09/12/23 1241   0.9 %  sodium chloride  infusion   Intravenous Continuous [name], MD   Stopped at 11/07/23 1624   calcium  gluconate 2 g in sodium chloride  0.9 % 100 mL IVPB  2 g Intravenous PRN [name], MD   Stopped at 09/12/23 1101   heparin  lock flush 100 UNIT/ML injection            prochlorperazine  (COMPAZINE ) 10 MG tablet            sodium chloride  flush (NS) 0.9 % injection 10 mL  10 mL Intravenous PRN [name], MD   10 mL at 02/18/21 0858   sodium chloride  flush (NS) 0.9 % injection 10 mL  10 mL Intravenous Once Borders, Joshua R, NP       sodium chloride  flush (NS) 0.9 % injection 10 mL  10 mL Intravenous Once [name], MD         PHYSICAL EXAMINATION: ECOG PERFORMANCE STATUS: 0 - Asymptomatic Vitals:   11/07/23 1314  BP: 130/78  Pulse: (!) 56  Resp: 20  Temp: (!) 97.4 F (36.3 C)  SpO2: 100%  Filed Weights   11/07/23 1314  Weight: 137 lb 6.4 oz (62.3 kg)     Physical Exam Constitutional:      General: She is not in acute distress. HENT:     Head: Normocephalic and atraumatic.  Eyes:     General: No scleral icterus. Cardiovascular:     Rate and Rhythm: Normal rate and regular rhythm.     Heart sounds: Normal heart sounds.  Pulmonary:     Effort: Pulmonary effort is normal. No respiratory distress.  Abdominal:     General: Bowel sounds are normal. There is no distension.     Palpations: Abdomen is soft.  Musculoskeletal:        General: No deformity. Normal range of motion.     Cervical back: Normal range of motion and neck supple.  Skin:    General: Skin is warm.      Coloration: Skin is not jaundiced.     Findings: No erythema.  Neurological:     Mental Status: She is alert and oriented to person, place, and time. Mental status is at baseline.  Psychiatric:        Mood and Affect: Mood normal.     LABORATORY DATA:  I have reviewed the data as listed    Latest Ref Rng & Units 11/07/2023   12:54 PM 10/24/2023    8:06 AM 10/10/2023   12:45 PM  CBC  WBC 4.0 - 10.5 K/uL 5.6  5.3  6.5   Hemoglobin 12.0 - 15.0 g/dL 09.8  11.9  14.7   Hematocrit 36.0 - 46.0 % 34.5  34.9  35.2   Platelets 150 - 400 K/uL 176  186  180       Latest Ref Rng & Units 11/07/2023   12:54 PM 10/24/2023    8:06 AM 10/10/2023   12:45 PM  CMP  Glucose 70 - 99 mg/dL 829  87  562   BUN 8 - 23 mg/dL 17  15  17    Creatinine 0.44 - 1.00 mg/dL 1.30  8.65  7.84   Sodium 135 - 145 mmol/L 139  138  138   Potassium 3.5 - 5.1 mmol/L 3.7  3.9  3.7   Chloride 98 - 111 mmol/L 106  108  106   CO2 22 - 32 mmol/L 24  22  23    Calcium  8.9 - 10.3 mg/dL 8.7  8.7  8.8   Total Protein 6.5 - 8.1 g/dL 6.5  6.4  6.6   Total Bilirubin 0.0 - 1.2 mg/dL 0.7  0.4  0.6   Alkaline Phos 38 - 126 U/L 81  86  83   AST 15 - 41 U/L 31  30  28    ALT 0 - 44 U/L 29  28  25       RADIOGRAPHIC STUDIES: I have personally reviewed the radiological images as listed and agreed with the findings in the report. Reviewed findings of MRI abdomen MRCP done at [facility]. CT CHEST ABDOMEN PELVIS W CONTRAST Result Date: 10/09/2023 CLINICAL DATA:  Pancreatic cancer restaging, ongoing chemotherapy status post Whipple procedure * Tracking Code: BO * EXAM: CT CHEST, ABDOMEN, AND PELVIS WITH CONTRAST TECHNIQUE: Multidetector CT imaging of the chest, abdomen and pelvis was performed following the standard protocol during bolus administration of intravenous contrast. RADIATION DOSE REDUCTION: This exam was performed according to the departmental dose-optimization program which includes automated exposure control, adjustment of the mA  and/or kV according to patient size and/or use of iterative reconstruction  technique. CONTRAST:  OMNIPAQUE  IOHEXOL  300 MG/ML  SOLN COMPARISON:  06/27/2023 FINDINGS: CT CHEST FINDINGS Cardiovascular: Unchanged enlargement of the tubular ascending thoracic aorta measuring up to 4.5 x 4.4 cm (series 2, image 31). Scattered aortic atherosclerosis normal heart size. No pericardial effusion. Mediastinum/Nodes: No enlarged mediastinal, hilar, or axillary lymph nodes. Thyroid gland, trachea, and esophagus demonstrate no significant findings. Lungs/Pleura: Bandlike scarring of the right lung base, unchanged. No significant change in an irregular nodule of the dependent left upper lobe measuring 0.6 cm (series 4, image 41). No significant change in adjacent nodules of the dependent left lower lobe, a cavitary nodule measuring 0.9 x 0.7 cm (series 4, image 77) and an adjacent solid nodule measuring 0.5 cm (series 4, image 72). Unchanged small nodule of the anterior inferior right upper lobe measuring 0.5 cm (series 4, image 71). No new nodules. No pleural effusion or pneumothorax. Musculoskeletal: No chest wall abnormality. No acute osseous findings. CT ABDOMEN PELVIS FINDINGS Hepatobiliary: Unchanged, very subtle hypodense lesion of the inferior margin of hepatic segment V adjacent to the gallbladder fossa measuring 0.8 cm (series 2, image 70). Status post cholecystectomy and hepatojejunostomy. No biliary dilatation. Pancreas: Status post Whipple pancreaticoduodenectomy. Diminished size and solid character of soft tissue at the right aspect of the central superior mesenteric vein, on today's examination measuring 1.0 x 0.8 cm, previously 1.3 x 1.1 cm (series 2, image 75). No pancreatic ductal dilatation or surrounding inflammatory changes. Spleen: Normal in size without significant abnormality. Adrenals/Urinary Tract: Adrenal glands are unremarkable. Kidneys are normal, without renal calculi, solid lesion, or  hydronephrosis. Bladder is unremarkable. Stomach/Bowel: Status post distal gastrectomy and gastrojejunostomy. Appendix appears normal. No evidence of bowel wall thickening, distention, or inflammatory changes. Vascular/Lymphatic: Aortic atherosclerosis. No enlarged abdominal or pelvic lymph nodes. Reproductive: No mass. Unchanged left ovarian cyst measuring 5.1 x 3.1 cm (series 2, image 102). Other: No abdominal wall hernia or abnormality. No ascites. Musculoskeletal: No acute osseous findings. IMPRESSION: 1. Status post Whipple pancreaticoduodenectomy. Diminished size and solid character of soft tissue at the right aspect of the central superior mesenteric vein, on today's examination measuring 1.0 x 0.8 cm, previously 1.3 x 1.1 cm which remains highly suspicious for local recurrence. 2. Unchanged, very subtle hypodense lesion of the inferior margin of hepatic segment V adjacent to the gallbladder fossa measuring 0.8 cm. Continued attention on follow-up. 3. No significant change in small bilateral pulmonary nodules, highly suspicious for small pulmonary metastases. 4. Unchanged left ovarian cyst measuring 5.1 x 3.1 cm. Although abnormal in the late postmenopausal setting, this is probably benign and attention on follow-up is sufficient in the setting of known malignancy. 5. Unchanged enlargement of the tubular ascending thoracic aorta measuring up to 4.5 x 4.4 cm. Ascending thoracic aortic aneurysm. Attention on follow-up in the setting of known primary malignancy. Aortic Atherosclerosis (ICD10-I70.0). Electronically Signed   By: Fredricka Jenny M.D.   On: 10/09/2023 16:57

## 2023-11-07 NOTE — Assessment & Plan Note (Signed)
 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 Continue calcium supplementation, 2000 mg daily-divided in 2-3 doses IV calcium gluconate 2g if calcium <=8.5 Normal vitamin D level

## 2023-11-07 NOTE — Patient Instructions (Signed)
 [facility] - A DEPT OF [facility]  Discharge Instructions: Thank you for choosing [facility] to provide your oncology and hematology care.  If you have a lab appointment with the Cancer Center, please go directly to the Cancer Center and check in at the registration area.  Wear comfortable clothing and clothing appropriate for easy access to any Portacath or PICC line.   We strive to give you quality time with your provider. You may need to reschedule your appointment if you arrive late (15 or more minutes).  Arriving late affects you and other patients whose appointments are after yours.  Also, if you miss three or more appointments without notifying the office, you may be dismissed from the clinic at the provider's discretion.      For prescription refill requests, have your pharmacy contact our office and allow 72 hours for refills to be completed.    Today you received the following chemotherapy and/or immunotherapy agents Abraxane and Gemzar   To help prevent nausea and vomiting after your treatment, we encourage you to take your nausea medication as directed.  BELOW ARE SYMPTOMS THAT SHOULD BE REPORTED IMMEDIATELY: *FEVER GREATER THAN 100.4 F (38 C) OR HIGHER *CHILLS OR SWEATING *NAUSEA AND VOMITING THAT IS NOT CONTROLLED WITH YOUR NAUSEA MEDICATION *UNUSUAL SHORTNESS OF BREATH *UNUSUAL BRUISING OR BLEEDING *URINARY PROBLEMS (pain or burning when urinating, or frequent urination) *BOWEL PROBLEMS (unusual diarrhea, constipation, pain near the anus) TENDERNESS IN MOUTH AND THROAT WITH OR WITHOUT PRESENCE OF ULCERS (sore throat, sores in mouth, or a toothache) UNUSUAL RASH, SWELLING OR PAIN  UNUSUAL VAGINAL DISCHARGE OR ITCHING   Items with * indicate a potential emergency and should be followed up as soon as possible or go to the Emergency Department if any problems should occur.  Please show the CHEMOTHERAPY ALERT CARD or  IMMUNOTHERAPY ALERT CARD at check-in to the Emergency Department and triage nurse.  Should you have questions after your visit or need to cancel or reschedule your appointment, please contact [facility] - A DEPT OF [facility]  [phone] and follow the prompts.  Office hours are 8:00 a.m. to 4:30 p.m. Monday - Friday. Please note that voicemails left after 4:00 p.m. may not be returned until the following business day.  We are closed weekends and major holidays. You have access to a nurse at all times for urgent questions. Please call the main number to the clinic [phone] and follow the prompts.  For any non-urgent questions, you may also contact your provider using MyChart. We now offer e-Visits for anyone 28 and older to request care online for non-urgent symptoms. For details visit [email or web address].   Also download the MyChart app! Go to the app store, search "MyChart", open the app, select , and log in with your MyChart username and password.

## 2023-11-07 NOTE — Assessment & Plan Note (Signed)
 Chemotherapy plan as listed above

## 2023-11-07 NOTE — Assessment & Plan Note (Signed)
 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  Pathology proved liver metastatic disease- additional chemotherapy --> whipple procedure.ypT1b ypN0--> adjuvant Gem/Abraxane finished in August 2023--> Sept 2024 CT showed progression in lung --> Nov 2024 Wedge biopsy- Recurrent pancreatic cancer with lung metastasis.  Currently on Gemcitabine and Abraxane 1 week on 1 week off schedule due to neutropenia.  Labs are reviewed and discussed with patient. Proceed with Gemcitabine and Abraxane.

## 2023-11-07 NOTE — Assessment & Plan Note (Signed)
 Mostly bothering her toes, grade 2.  She did not tolerate Cymbalta .   Stable symptom She gets acupuncture sessions which help her symptoms.

## 2023-11-08 LAB — CEA: CEA: 6 ng/mL — ABNORMAL HIGH (ref 0.0–4.7)

## 2023-11-08 LAB — CANCER ANTIGEN 19-9: CA 19-9: 8 U/mL (ref 0–35)

## 2023-11-15 ENCOUNTER — Encounter (INDEPENDENT_AMBULATORY_CARE_PROVIDER_SITE_OTHER): Payer: Self-pay

## 2023-11-16 ENCOUNTER — Encounter: Payer: Self-pay | Admitting: Oncology

## 2023-11-22 ENCOUNTER — Other Ambulatory Visit: Payer: Self-pay

## 2023-11-22 ENCOUNTER — Inpatient Hospital Stay (HOSPITAL_BASED_OUTPATIENT_CLINIC_OR_DEPARTMENT_OTHER): Admitting: Oncology

## 2023-11-22 ENCOUNTER — Ambulatory Visit: Admitting: Oncology

## 2023-11-22 ENCOUNTER — Encounter: Payer: Self-pay | Admitting: Oncology

## 2023-11-22 ENCOUNTER — Inpatient Hospital Stay

## 2023-11-22 ENCOUNTER — Other Ambulatory Visit

## 2023-11-22 ENCOUNTER — Ambulatory Visit

## 2023-11-22 VITALS — BP 138/77 | HR 51 | Temp 97.9°F | Resp 18 | Wt 139.2 lb

## 2023-11-22 DIAGNOSIS — Z5111 Encounter for antineoplastic chemotherapy: Secondary | ICD-10-CM

## 2023-11-22 DIAGNOSIS — C259 Malignant neoplasm of pancreas, unspecified: Secondary | ICD-10-CM

## 2023-11-22 DIAGNOSIS — K8689 Other specified diseases of pancreas: Secondary | ICD-10-CM

## 2023-11-22 DIAGNOSIS — G620 Drug-induced polyneuropathy: Secondary | ICD-10-CM | POA: Diagnosis not present

## 2023-11-22 DIAGNOSIS — T451X5A Adverse effect of antineoplastic and immunosuppressive drugs, initial encounter: Secondary | ICD-10-CM

## 2023-11-22 LAB — CBC WITH DIFFERENTIAL (CANCER CENTER ONLY)
Abs Immature Granulocytes: 0.02 10*3/uL (ref 0.00–0.07)
Basophils Absolute: 0.1 10*3/uL (ref 0.0–0.1)
Basophils Relative: 1 %
Eosinophils Absolute: 0.1 10*3/uL (ref 0.0–0.5)
Eosinophils Relative: 2 %
HCT: 34.4 % — ABNORMAL LOW (ref 36.0–46.0)
Hemoglobin: 11.6 g/dL — ABNORMAL LOW (ref 12.0–15.0)
Immature Granulocytes: 0 %
Lymphocytes Relative: 30 %
Lymphs Abs: 1.8 10*3/uL (ref 0.7–4.0)
MCH: 32.7 pg (ref 26.0–34.0)
MCHC: 33.7 g/dL (ref 30.0–36.0)
MCV: 96.9 fL (ref 80.0–100.0)
Monocytes Absolute: 0.6 10*3/uL (ref 0.1–1.0)
Monocytes Relative: 11 %
Neutro Abs: 3.3 10*3/uL (ref 1.7–7.7)
Neutrophils Relative %: 56 %
Platelet Count: 172 10*3/uL (ref 150–400)
RBC: 3.55 MIL/uL — ABNORMAL LOW (ref 3.87–5.11)
RDW: 14.6 % (ref 11.5–15.5)
WBC Count: 5.9 10*3/uL (ref 4.0–10.5)
nRBC: 0 % (ref 0.0–0.2)

## 2023-11-22 LAB — CMP (CANCER CENTER ONLY)
ALT: 29 U/L (ref 0–44)
AST: 32 U/L (ref 15–41)
Albumin: 4.1 g/dL (ref 3.5–5.0)
Alkaline Phosphatase: 76 U/L (ref 38–126)
Anion gap: 8 (ref 5–15)
BUN: 15 mg/dL (ref 8–23)
CO2: 22 mmol/L (ref 22–32)
Calcium: 8.6 mg/dL — ABNORMAL LOW (ref 8.9–10.3)
Chloride: 108 mmol/L (ref 98–111)
Creatinine: 0.58 mg/dL (ref 0.44–1.00)
GFR, Estimated: >60 mL/min (ref >60)
Glucose, Bld: 129 mg/dL — ABNORMAL HIGH (ref 70–99)
Potassium: 3.7 mmol/L (ref 3.5–5.1)
Sodium: 138 mmol/L (ref 135–145)
Total Bilirubin: 0.7 mg/dL (ref 0.0–1.2)
Total Protein: 6.7 g/dL (ref 6.5–8.1)

## 2023-11-22 LAB — GENETIC SCREENING ORDER

## 2023-11-22 MED ORDER — PACLITAXEL PROTEIN-BOUND CHEMO INJECTION 100 MG
100.0000 mg/m2 | Freq: Once | INTRAVENOUS | Status: AC
  Administered 2023-11-22: 175 mg via INTRAVENOUS
  Filled 2023-11-22: qty 35

## 2023-11-22 MED ORDER — HEPARIN SOD (PORK) LOCK FLUSH 100 UNIT/ML IV SOLN
500.0000 [IU] | Freq: Once | INTRAVENOUS | Status: AC | PRN
  Administered 2023-11-22: 500 [IU]
  Filled 2023-11-22: qty 5

## 2023-11-22 MED ORDER — SODIUM CHLORIDE 0.9 % IV SOLN
INTRAVENOUS | Status: DC
  Filled 2023-11-22: qty 250

## 2023-11-22 MED ORDER — SODIUM CHLORIDE 0.9 % IV SOLN
1000.0000 mg/m2 | Freq: Once | INTRAVENOUS | Status: AC
  Administered 2023-11-22: 1710 mg via INTRAVENOUS
  Filled 2023-11-22: qty 44.97

## 2023-11-22 NOTE — Assessment & Plan Note (Signed)
 Mostly bothering her toes, grade 2.  She did not tolerate Cymbalta .   Stable symptom She gets acupuncture sessions which help her symptoms.

## 2023-11-22 NOTE — Assessment & Plan Note (Signed)
 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  Pathology proved liver metastatic disease- additional chemotherapy --> whipple procedure.ypT1b ypN0--> adjuvant Gem/Abraxane  finished in August 2023--> Sept 2024 CT showed progression in lung --> Nov 2024 Wedge biopsy- Recurrent pancreatic cancer with lung metastasis.  Currently on Gemcitabine  and Abraxane  1 week on 1 week off schedule due to neutropenia.  Labs are reviewed and discussed with patient.  Proceed with Gemcitabine  and Abraxane .  Plan to obtain repeat CT staging in July. Obtain [employer] circulating tumor DNA testing.

## 2023-11-22 NOTE — Assessment & Plan Note (Signed)
 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 Continue calcium supplementation, 2000 mg daily-divided in 2-3 doses IV calcium gluconate 2g if calcium <=8.5 Normal vitamin D level

## 2023-11-22 NOTE — Progress Notes (Signed)
 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  Pathology proved liver metastatic disease- additional chemotherapy --> whipple procedure.ypT1b ypN0--> adjuvant Gem/Abraxane  finished in August 2023--> Sept 2024 CT showed progression in lung --> Nov 2024 Wedge biopsy- Recurrent pancreatic cancer with lung metastasis.  Currently on Gemcitabine  and Abraxane  1 week on 1 week off schedule due to neutropenia.  Labs are reviewed and discussed with patient.  Proceed with Gemcitabine  and Abraxane .  Plan to obtain repeat CT staging in July. Obtain [employer] circulating tumor DNA testing.    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  She did not tolerate Cymbalta .   Stable symptom She gets acupuncture sessions which help her symptoms.   Encounter for antineoplastic chemotherapy Chemotherapy plan as listed above  Hypocalcemia Likely due to malabsorption. Continue calcium  supplementation, 2000 mg daily-divided in 2-3 doses IV calcium  gluconate 2g if calcium  <=8.5 Normal vitamin D  level   Pancreatic insufficiency S/p  Whipple procedure..  Continue Creon , continue 24000 units TID with meals and  with her snacks   Orders Placed This Encounter  Procedures   Signatera    Select as applicable. If patient is on or planning to receive immunotherapy, select drug: Not on Immunotherapy If "Other or Multiple", Write down drug name: Pt is currently on chemo  Abraxane  and gemcitabine    Do not Delete Below This Line   ==========Department Information========== ID: [id number] Department:[facility] - A DEPT OF [facility] [address] [place] [address] Dept: [phone] Dept Fax: [phone]    Standing Status:   Standing    Number of Occurrences:   7    Expiration Date:   11/21/2024    Linard Reno to follow up with patient for sample collection (mobile phleb, lab, or saliva)::   No    Surveillance Program draw frequency::   Every 3 Months    Surveillance Program draw count::   4    Cancer type::   Other    Add Cancer Type details (if selecting "Other" for cancer type):   pancreatic    By placing this electronic order I confirm the testing ordered herein is medically necessary and this patient has been informed of the details of the genetic test(s) ordered, including the risks, benefits, and alternatives, and has consented to testing.:   Yes    What type of billing?:   [employer] Screening Order    Standing Status:   Future    Number of Occurrences:   1    Expected Date:   11/22/2023    Expiration Date:   11/21/2024    Follow up  2 weeks lab MD Gemcitaibine Abraxane     All questions were answered. The patient knows to call the clinic with any problems, questions or concerns.  [name], MD, PhD [facility] Hematology Oncology 11/22/2023   [name], MD    HISTORY OF PRESENTING ILLNESS:   Cassandra [name] is a  68 y.o.  female with  presents for follow up of Stage IV pancreatic adenocarcinoma Patient initially presented with jaundice, transaminitis, bilirubin was 9.9.  CA 19-9 was 1874.  Patient also reports unintentional weight loss. 02/08/2020 MRI abdomen and MRCP with and without contrast was done at [facility] which showed pancreatic head mass measuring up to 3 cm, with marked associated narrowing of the portal vein confluence.  SMA is preserved.   Marked intrahepatic and extrahepatic biliary duct dilatation as well as mild dilatation of the main pancreatic duct. Multiple hepatic masses highly concerning for metastatic disease.  Patient underwent EUS on 02/19/2020, which showed irregular mass identified in the pancreatic head, hypoechoic, measured 60mmx33mm, sonographic evidence concerning for invasion into the superior mesenteric artery.  There is no sign of significant abnormality in the main pancreatic duct.  Dilatation of common bile duct which measured up to 16 mm.  Region of celiac artery was visualized and showed no signs of significant abnormality.  No lymphadenopathy.  FNA showed adenocarcinoma.  02/19/2020, ERCP, malignant.  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  The stricture was treated with placement of wall flex metal stent.  Patient was seen by [facility] oncology Dr. [name] and was recommended for 3 drug regimen FOLFIRINOX.  Patient prefers to do chemotherapy locally at [facility].  Patient was referred to establish care today. She denies any pain.  Since stent placement, skin jaundice has improved.  Itchiness has also improved. Patient was accompanied by her husband today.  She has a family history of breast cancer in sister and paternal aunt, colon cancer paternal grandmother.  #No reportable targetable mutation on NGS 9/14/2021cycle 1 FOLFIRINOX.  Patient received oxaliplatin  and about 50% of Irinotecan  on day 1 and had experienced neurologic symptoms.  She went to ER and working diagnosis is TIA, and eventually I think this is due to irinotecan  side effects -irinotecan -associated dysarthria, lip/tongue numbness.  Adjustment was made for Irinotecan  to be  infused over 180 minutes.  Atropine  0.5 mg once prior to the irinotecan . No recurrent symptoms.   # 03/19/2020-03/21/2020 patient was admitted due to sepsis with strep pneumonia bacteremia.  Patient was treated with IV  Rocephin .  TEE was done which showed no vegetation.  No PFO or ASD.  Patient was discharged home and he finished full course of 14 days of IV Rocephin  on 04/02/2020 per ID recommendation.  Repeat blood culture was also negative.  08/25/20 cycle 10 FOLFIRINOX 09/08/20- present  Starting cycle 11, FOLFIRI, Oxaliplatin  discontinued due to neuropathy   #NGS showed no reportable targetable mutation #Genetic testing-[employer] diagnostic testing showed no pathological variants identified. MS stable, TMB 0mut/mb, KRAS G12D, SF3B1 K700E, TP53 V262fs*30  #01/14/2021  CT done at [facility] during the interval, stable disease.  # Her case was presented by Dr.Jia from [facility] tumor board. Susann Eon were 4~5 liver lesions on OSH MRI last year at the time of diagnosis highly suspicious for metastasis. She is not eligible for surgical protocol for metastasis resection given the number of liver metastasis is >3. However given her excellent and durable response to chemo, surgical resection may be considered pending sustained disease control at 1 year and may require partial hepatectomy first to see if there is viable tumor before proceeding to Whipple resection ]  Patient had a COVID-19 infection in September 2022.  03/23/2021 CT abdomen pelvis No significant change in the ill-defined pancreatic head mass or  associated biliary ductal and pancreatic ductal dilation.  Slight interval enlargement of subcentimeter  anterior peripancreatic  lymph node, nonspecific. Attention on follow-up per clinical protocol. Similar enlargement of the ascending thoracic aorta measuring 4.4 cm  03/23/2021 MRI abdomen w and wo  Increasing ill-defined hypoenhancement of the pancreatic head surrounding the common bile duct stent, in keeping with known pancreatic malignancy. Anterior peripancreatic lymph node is better evaluated on CT.  Unchanged position of a common bile duct stent with similar mild diffuse intrahepatic ductal dilation and pneumobilia. No  evidence of metastatic disease in the abdomen or pelvis. Partially visualized cystic left adnexal lesion measuring up to 3.9 cm.   04/10/2021, patient underwent liver resection at [facility], by Dr. Zani.   Pathology report from [facility] was reviewed. Segments 3 partial hepatectomy, negative for viable tumor. Section 5/6 partial hepatectomy part 1 and 2, microscopic foci of residual viable adenocarcinoma, morphologically consistent with history of pancreatic primary.  Parenchymal margin is uninvolved.  #Patient resumed on FOLFIRI on 04/28/2021. She got another cycle on 05/12/2021 #05/26/2021, patient did not get additional chemotherapy due to transaminitis.  Shared decision was made to stop FOLFIRI and switch to gemcitabine  Abraxane  treatments.  05/26/2021, patient developed transaminitis, AST 763, ALT 691, alkaline phosphatase 364.  Bilirubin 0.7 05/26/2021 stat ultrasound abdomen right upper quadrant showed interval development of 3.3 x 1.8 cm complex mass in the right hepatic lobe.  Increased extrahepatic ductal dilatation.  Concerning for CBD obstruction. 05/29/2021, MRI abdomen MRCP with and without contrast showed unchanged pancreatic head soft tissue.  Severe intra and extrahepatic biliary ductal dilatation.  Common bile duct stents remain in position however patency is not established.  No evidence of lymphadenopathy or metastatic disease in the abdomen.  With further communication with radiologist, addendum was added that there is internal fluid signal and no appreciable associated contrast enhancement measuring 3.4 x 2.3 cm.  This appearance is generally not consistent with metastasis and is of uncertain significance.  Possibly reflecting hepatic abscess or residual of subcapsular hematoma.  Patient was seen by [facility] Dr. Barbara Levins team.  He had ERCP done on 06/05/2021, with findings of One stent from the biliary tree was seen in the major papilla. The stent had migrated significantly into the duodenum. This  is the cause of stent malfunction. One stent was removed from the biliary tree. Prior biliary sphincterotomy appeared open. - A single severe biliary stricture was found in the lower third of the main bile duct with upstream dilation. The stricture was alignant appearing.- The biliary tree was swept and sludge was found.- One fully covered metal stent was placed into the common bile duct across the stricture.- No pancreatogram performed.  06/12/2021-08/10/2021 gemcitabine  and Abraxane .   10/14/2021, patient underwent Whipple procedure. Liver biopsy negative for malignancy. Review procedure showed invasive adenocarcinoma, moderately differentiated, centered in pancreatic head and confined in the Pancreas. Surgical margin is negative for malignancy.  36 lymph nodes were all negative for malignancy.  1 hepatic artery lymph node was harvested and was negative.  Gallbladder negative for malignancy.  Cystic duct excision negative for malignancy. pT1b pN0  10/25/2021, CT abdomen pelvis with contrast showed rim-enhancing fluid collection in the region of the hepatic hilum. Patient had a readmission for superficial wound infection.  She completed antibiotics on 11/03/2021.  11/03/2021, CT abdomen pelvis with contrast showed near resolution of previously seen fluid collection in the region of the hepatic hilum.  Similar appearance of the area of hypoenhancement in the liver.  Attention on follow-up.  11/17/2021, resumed on gemcitabine /Abraxane . 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  12/12/ 2023 CT scan at [facility] shows no evidence of cancer recurrence.-NED 09/21/2022 CT chest abdomen pelvis w contrast at [facility] showed-No evidence of metastatic disease in the chest, abdomen, or pelvis. 12/21/2022 CT chest abdomen pelvis w contrast at [facility] showed-No evidence of metastatic disease in the chest, abdomen, or pelvis.   03/22/2023 CT scan at [facility] showed enlarging lung nodules. RLL 1cm, LLL 0.5cm, RUL 0.5cm Stable  hypoattenuating right inferior hepatic lobe lesion measuring 0.9 cm.  Similar left adnexal cystic lesion measuring up to 5.0 cm.   04/06/2023 s/p lung nodule biopsy.  Rare atypical epithelioid cells present with evidence suggestive of invasion, suspicious but not diagnostic for malignancy.   05/20/2023: Right robot assisted thoracoscopic wedge resection with MLND (Dr. Sandford Croon)   Pathology:  Suspicious for malignancy but not diagnostic A. Lymph node, level 8, biopsy: Metastatic carcinoma involving one lymph node (1/1).  B. Lung, right lower lobe, wedge resection: Lung tissue positive for adenocarcinoma, consistent with metastasis from pancreas primary. See comment. Tumor size: 1.0 cm. Operative margin: Negative  Comment: Note is made of the patient's history of invasive adenocarcinoma of the pancreatic head ([id number]; 10/14/2021), which is compared to the current case. Comparison is challenging given the post-treatment nature of the pancreatic tumor; however, there are some morphologic similarities.  Although often unhelpful in the setting of lung primary versus pancreatic primary, immunohistochemical stains were performed. Tumor cells demonstrate the below immunoprofile which would could support pulmonary adenocarcinoma with enteric differentiation but would also be consistent with metastasis from the patient's known pancreatic primary; the latter is favored.              Positive                             Negative CK7 (strong, diffuse) TTF-1  CK20 (strong, focal)                    Positive and negative controls perform appropriately.    C. Lymph node, level 7, biopsy: Metastatic carcinoma involving one lymph node (1/1).  D. Lymph node, level 4R, biopsy: One lymph node, negative for malignancy (0/1).   INTERVAL HISTORY Cassandra [name] is a 68 y.o. female who has above history reviewed by me today presents for stage IV pancreatic cancer.   She tolerates Gemcitabine  Abraxane  1 week on and 1  week off, overall she tolerates well.  She feels tired after chemotherapy. 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  She gets acupuncture session which helped her symptoms.   She takes calcium  supplementation as instructed.  She take Creon  for pancreatic insufficiency.    Review of Systems  Constitutional:  Positive for fatigue. Negative for appetite change, chills, fever and unexpected weight change.  HENT:   Negative for hearing loss and voice change.   Eyes:  Negative for eye problems.  Respiratory:  Negative for chest tightness and cough.   Cardiovascular:  Negative for chest pain.  Gastrointestinal:  Negative for abdominal distention, abdominal pain, blood in stool and nausea.  Endocrine: Negative for hot flashes.  Genitourinary:  Negative for difficulty urinating and frequency.   Musculoskeletal:  Negative for arthralgias.  Skin:  Negative for itching and rash.  Neurological:  Positive for numbness. Negative for extremity weakness.  Hematological:  Negative for adenopathy.  Psychiatric/Behavioral:  Negative for confusion.     MEDICAL HISTORY:  Past Medical History:  Diagnosis Date   Allergy    Anemia    Bacteremia due to Streptococcus pneumoniae 03/19/2020   Cancer [facility])    pancreatic cancer   Colon polyps    Family history of breast cancer    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   Neutropenia (HCC) 04/18/2020   Osteopenia after menopause 05/2017   femoral neck T score -2.0   Personal history of chemotherapy    current for pancreatic ca   PONV (postoperative nausea and vomiting)    Pure hypercholesterolemia    Sepsis (HCC) 03/19/2020    SURGICAL HISTORY: Past Surgical History:  Procedure Laterality Date   COLONOSCOPY  07/2015   WNL   COLONOSCOPY  2008/2011   COLONOSCOPY WITH PROPOFOL  N/A 07/10/2021   Procedure: COLONOSCOPY WITH PROPOFOL ;  Surgeon: Shane Darling, MD;  Location: [facility]  ENDOSCOPY;  Service: Endoscopy;  Laterality: N/A;   IR CV LINE INJECTION  04/28/2021   OVARIAN CYST REMOVAL  1992   dermoid-Dr CAK   PORTA CATH INSERTION N/A 03/07/2020   Procedure: PORTA CATH INSERTION;  Surgeon: Celso College, MD;  Location: [facility] INVASIVE CV LAB;  Service: Cardiovascular;  Laterality: N/A;   TEE WITHOUT CARDIOVERSION N/A 03/21/2020   Procedure: TRANSESOPHAGEAL ECHOCARDIOGRAM (TEE);  Surgeon: Cherrie Cornwall, MD;  Location: [facility] ORS;  Service: Cardiovascular;  Laterality: N/A;   TUBAL LIGATION  1993    SOCIAL HISTORY: Social History   Socioeconomic History   Marital status: Married    Spouse name: Not on file   Number of children: 2   Years of education: Not on file   Highest education level: Bachelor's degree (e.g., BA, AB, BS)  Occupational History   Occupation: Runner, broadcasting/film/video    Comment: retired   Occupation: Visual merchandiser  Tobacco Use   Smoking status: Former    Current packs/day: 0.00    Average packs/day: 1 pack/day for 10.0 years (10.0 ttl pk-yrs)    Types: Cigarettes    Start date: 06/29/1975    Quit date: 06/28/1985    Years since quitting: 38.4   Smokeless tobacco: Never   Tobacco comments:    Quit smoking 1987  Vaping Use   Vaping status: Never Used  Substance and Sexual Activity   Alcohol use: Yes    Alcohol/week: 5.0 standard drinks of alcohol    Types: 5 Glasses of wine per week    Comment: 0-2 mixed drinks a day   Drug use: No   Sexual activity: Not Currently    Birth control/protection: Post-menopausal  Other Topics Concern   Not on file  Social History Narrative   Not on file   Social Drivers of Health   Financial Resource Strain: Low Risk  (10/18/2023)   Overall Financial Resource Strain (CARDIA)    Difficulty of Paying Living Expenses: Not hard at all  Food Insecurity: No Food Insecurity (10/18/2023)   Hunger Vital Sign    Worried About Running Out of Food in the Last Year: Never true    Ran Out of Food in the Last Year: Never true   Transportation Needs: No Transportation Needs (10/18/2023)   PRAPARE - Administrator, Civil Service (Medical): No    Lack of Transportation (Non-Medical): No  Physical Activity: Sufficiently Active (10/18/2023)   Exercise Vital Sign    Days of Exercise per Week: 6 days    Minutes of Exercise per Session: 30 min  Stress: No Stress Concern Present (10/18/2023)   [employer] of  Occupational Health - Occupational Stress Questionnaire    Feeling of Stress : Not at all  Social Connections: Moderately Integrated (10/18/2023)   Social Connection and Isolation Panel [NHANES]    Frequency of Communication with Friends and Family: More than three times a week    Frequency of Social Gatherings with Friends and Family: More than three times a week    Attends Religious Services: More than 4 times per year    Active Member of [employer] or Organizations: No    Attends Banker Meetings: Never    Marital Status: Married  Catering manager Violence: Not At Risk (10/18/2023)   Humiliation, Afraid, Rape, and Kick questionnaire    Fear of Current or Ex-Partner: No    Emotionally Abused: No    Physically Abused: No    Sexually Abused: No    FAMILY HISTORY: Family History  Problem Relation Age of Onset   Breast cancer Paternal Aunt 67   Diabetes Mother    Osteoporosis Mother    Hyperlipidemia Father    Rheumatic fever Father    Valvular heart disease Father    Cancer Paternal Grandmother        possible colon   Breast cancer Sister 73    ALLERGIES:  is allergic to penicillin g.  MEDICATIONS:  Current Outpatient Medications  Medication Sig Dispense Refill   aspirin  81 MG chewable tablet Chew 81 mg by mouth daily.     b complex vitamins capsule Take 1 capsule by mouth daily.     Calcium  Carbonate-Vit D-Min (CALCIUM  1200 PO) Take by mouth.     Cholecalciferol  25 MCG (1000 UT) tablet Take 2,000 Units by mouth daily.     clobetasol  cream (TEMOVATE ) 0.05 %      fluticasone  (FLONASE) 50 MCG/ACT nasal spray Place 1 spray into both nostrils daily.     lidocaine -prilocaine  (EMLA ) cream Apply small amount to port and cover with saran wrap 1-2 hours prior to port access 30 g 6   loratadine (CLARITIN REDITABS) 10 MG dissolvable tablet Take 10 mg by mouth daily.     Melatonin 2.5 MG CHEW Chew by mouth.     Multiple Vitamins-Minerals (MULTIVITAMIN WITH MINERALS) tablet Take 1 tablet by mouth daily.     Pancrelipase , Lip-Prot-Amyl, (CREON ) 24000-76000 units CPEP TAKE 1 CAPSULE THREE TIMES DAILY BEFORE MEALS 200 capsule 3   prochlorperazine  (COMPAZINE ) 10 MG tablet Take 10 mg by mouth every 6 (six) hours as needed for nausea or vomiting.     senna-docusate (SENOKOT-S) 8.6-50 MG tablet Take by mouth.     simethicone  (GAS-X) 80 MG chewable tablet Chew 1 tablet (80 mg total) by mouth every 8 (eight) hours as needed for flatulence. 60 tablet 0   No current facility-administered medications for this visit.   Facility-Administered Medications Ordered in Other Visits  Medication Dose Route Frequency Provider Last Rate Last Admin   0.9 %  sodium chloride  infusion   Intravenous Continuous [name], MD 10 mL/hr at 09/12/23 1241 Infusion Verify at 09/12/23 1241   0.9 %  sodium chloride  infusion   Intravenous Continuous [name], MD   Stopped at 11/22/23 1621   calcium  gluconate 2 g in sodium chloride  0.9 % 100 mL IVPB  2 g Intravenous PRN [name], MD   Stopped at 09/12/23 1101   heparin  lock flush 100 UNIT/ML injection            prochlorperazine  (COMPAZINE ) 10 MG tablet  sodium chloride  flush (NS) 0.9 % injection 10 mL  10 mL Intravenous PRN [name], MD   10 mL at 02/18/21 0858   sodium chloride  flush (NS) 0.9 % injection 10 mL  10 mL Intravenous Once Borders, Carlene Che, NP       sodium chloride  flush (NS) 0.9 % injection 10 mL  10 mL Intravenous Once [name], MD         PHYSICAL EXAMINATION: ECOG PERFORMANCE STATUS: 0 - Asymptomatic Vitals:   11/22/23 1338  BP:  138/77  Pulse: (!) 51  Resp: 18  Temp: 97.9 F (36.6 C)  SpO2: 100%   Filed Weights   11/22/23 1338  Weight: 139 lb 3.2 oz (63.1 kg)     Physical Exam Constitutional:      General: She is not in acute distress. HENT:     Head: Normocephalic and atraumatic.  Eyes:     General: No scleral icterus. Cardiovascular:     Rate and Rhythm: Normal rate and regular rhythm.     Heart sounds: Normal heart sounds.  Pulmonary:     Effort: Pulmonary effort is normal. No respiratory distress.  Abdominal:     General: Bowel sounds are normal. There is no distension.     Palpations: Abdomen is soft.  Musculoskeletal:        General: No deformity. Normal range of motion.     Cervical back: Normal range of motion and neck supple.  Skin:    General: Skin is warm.     Coloration: Skin is not jaundiced.     Findings: No erythema.  Neurological:     Mental Status: She is alert and oriented to person, place, and time. Mental status is at baseline.  Psychiatric:        Mood and Affect: Mood normal.     LABORATORY DATA:  I have reviewed the data as listed    Latest Ref Rng & Units 11/22/2023    1:08 PM 11/07/2023   12:54 PM 10/24/2023    8:06 AM  CBC  WBC 4.0 - 10.5 K/uL 5.9  5.6  5.3   Hemoglobin 12.0 - 15.0 g/dL 98.1  19.1  47.8   Hematocrit 36.0 - 46.0 % 34.4  34.5  34.9   Platelets 150 - 400 K/uL 172  176  186       Latest Ref Rng & Units 11/22/2023    1:08 PM 11/07/2023   12:54 PM 10/24/2023    8:06 AM  CMP  Glucose 70 - 99 mg/dL 295  621  87   BUN 8 - 23 mg/dL 15  17  15    Creatinine 0.44 - 1.00 mg/dL 3.08  6.57  8.46   Sodium 135 - 145 mmol/L 138  139  138   Potassium 3.5 - 5.1 mmol/L 3.7  3.7  3.9   Chloride 98 - 111 mmol/L 108  106  108   CO2 22 - 32 mmol/L 22  24  22    Calcium  8.9 - 10.3 mg/dL 8.6  8.7  8.7   Total Protein 6.5 - 8.1 g/dL 6.7  6.5  6.4   Total Bilirubin 0.0 - 1.2 mg/dL 0.7  0.7  0.4   Alkaline Phos 38 - 126 U/L 76  81  86   AST 15 - 41 U/L 32  31  30    ALT 0 - 44 U/L 29  29  28       RADIOGRAPHIC STUDIES: I have personally reviewed  the radiological images as listed and agreed with the findings in the report. Reviewed findings of MRI abdomen MRCP done at [facility]. CT CHEST ABDOMEN PELVIS W CONTRAST Result Date: 10/09/2023 CLINICAL DATA:  Pancreatic cancer restaging, ongoing chemotherapy status post Whipple procedure * Tracking Code: BO * EXAM: CT CHEST, ABDOMEN, AND PELVIS WITH CONTRAST TECHNIQUE: Multidetector CT imaging of the chest, abdomen and pelvis was performed following the standard protocol during bolus administration of intravenous contrast. RADIATION DOSE REDUCTION: This exam was performed according to the departmental dose-optimization program which includes automated exposure control, adjustment of the mA and/or kV according to patient size and/or use of iterative reconstruction technique. CONTRAST:  OMNIPAQUE  IOHEXOL  300 MG/ML  SOLN COMPARISON:  06/27/2023 FINDINGS: CT CHEST FINDINGS Cardiovascular: Unchanged enlargement of the tubular ascending thoracic aorta measuring up to 4.5 x 4.4 cm (series 2, image 31). Scattered aortic atherosclerosis normal heart size. No pericardial effusion. Mediastinum/Nodes: No enlarged mediastinal, hilar, or axillary lymph nodes. Thyroid gland, trachea, and esophagus demonstrate no significant findings. Lungs/Pleura: Bandlike scarring of the right lung base, unchanged. No significant change in an irregular nodule of the dependent left upper lobe measuring 0.6 cm (series 4, image 41). No significant change in adjacent nodules of the dependent left lower lobe, a cavitary nodule measuring 0.9 x 0.7 cm (series 4, image 77) and an adjacent solid nodule measuring 0.5 cm (series 4, image 72). Unchanged small nodule of the anterior inferior right upper lobe measuring 0.5 cm (series 4, image 71). No new nodules. No pleural effusion or pneumothorax. Musculoskeletal: No chest wall abnormality. No acute osseous findings.  CT ABDOMEN PELVIS FINDINGS Hepatobiliary: Unchanged, very subtle hypodense lesion of the inferior margin of hepatic segment V adjacent to the gallbladder fossa measuring 0.8 cm (series 2, image 70). Status post cholecystectomy and hepatojejunostomy. No biliary dilatation. Pancreas: Status post Whipple pancreaticoduodenectomy. Diminished size and solid character of soft tissue at the right aspect of the central superior mesenteric vein, on today's examination measuring 1.0 x 0.8 cm, previously 1.3 x 1.1 cm (series 2, image 75). No pancreatic ductal dilatation or surrounding inflammatory changes. Spleen: Normal in size without significant abnormality. Adrenals/Urinary Tract: Adrenal glands are unremarkable. Kidneys are normal, without renal calculi, solid lesion, or hydronephrosis. Bladder is unremarkable. Stomach/Bowel: Status post distal gastrectomy and gastrojejunostomy. Appendix appears normal. No evidence of bowel wall thickening, distention, or inflammatory changes. Vascular/Lymphatic: Aortic atherosclerosis. No enlarged abdominal or pelvic lymph nodes. Reproductive: No mass. Unchanged left ovarian cyst measuring 5.1 x 3.1 cm (series 2, image 102). Other: No abdominal wall hernia or abnormality. No ascites. Musculoskeletal: No acute osseous findings. IMPRESSION: 1. Status post Whipple pancreaticoduodenectomy. Diminished size and solid character of soft tissue at the right aspect of the central superior mesenteric vein, on today's examination measuring 1.0 x 0.8 cm, previously 1.3 x 1.1 cm which remains highly suspicious for local recurrence. 2. Unchanged, very subtle hypodense lesion of the inferior margin of hepatic segment V adjacent to the gallbladder fossa measuring 0.8 cm. Continued attention on follow-up. 3. No significant change in small bilateral pulmonary nodules, highly suspicious for small pulmonary metastases. 4. Unchanged left ovarian cyst measuring 5.1 x 3.1 cm. Although abnormal in the late  postmenopausal setting, this is probably benign and attention on follow-up is sufficient in the setting of known malignancy. 5. Unchanged enlargement of the tubular ascending thoracic aorta measuring up to 4.5 x 4.4 cm. Ascending thoracic aortic aneurysm. Attention on follow-up in the setting of known primary malignancy. Aortic Atherosclerosis (ICD10-I70.0). Electronically  Signed   By: Fredricka Jenny M.D.   On: 10/09/2023 16:57

## 2023-11-22 NOTE — Assessment & Plan Note (Signed)
 S/p  Whipple procedure..  Continue Creon, continue [id number] units TID with meals and  with her snacks

## 2023-11-22 NOTE — Assessment & Plan Note (Signed)
 Chemotherapy plan as listed above

## 2023-12-05 ENCOUNTER — Inpatient Hospital Stay (HOSPITAL_BASED_OUTPATIENT_CLINIC_OR_DEPARTMENT_OTHER): Admitting: Oncology

## 2023-12-05 ENCOUNTER — Inpatient Hospital Stay: Attending: Oncology

## 2023-12-05 ENCOUNTER — Encounter: Payer: Self-pay | Admitting: Oncology

## 2023-12-05 ENCOUNTER — Inpatient Hospital Stay

## 2023-12-05 VITALS — BP 153/78 | HR 50 | Temp 97.6°F | Resp 18

## 2023-12-05 VITALS — BP 130/83 | HR 54 | Temp 97.7°F | Resp 18 | Wt 135.0 lb

## 2023-12-05 DIAGNOSIS — T451X5A Adverse effect of antineoplastic and immunosuppressive drugs, initial encounter: Secondary | ICD-10-CM

## 2023-12-05 DIAGNOSIS — C779 Secondary and unspecified malignant neoplasm of lymph node, unspecified: Secondary | ICD-10-CM | POA: Insufficient documentation

## 2023-12-05 DIAGNOSIS — K8689 Other specified diseases of pancreas: Secondary | ICD-10-CM

## 2023-12-05 DIAGNOSIS — C250 Malignant neoplasm of head of pancreas: Secondary | ICD-10-CM | POA: Insufficient documentation

## 2023-12-05 DIAGNOSIS — Z87891 Personal history of nicotine dependence: Secondary | ICD-10-CM | POA: Insufficient documentation

## 2023-12-05 DIAGNOSIS — G620 Drug-induced polyneuropathy: Secondary | ICD-10-CM | POA: Diagnosis not present

## 2023-12-05 DIAGNOSIS — C259 Malignant neoplasm of pancreas, unspecified: Secondary | ICD-10-CM | POA: Diagnosis not present

## 2023-12-05 DIAGNOSIS — C7800 Secondary malignant neoplasm of unspecified lung: Secondary | ICD-10-CM | POA: Insufficient documentation

## 2023-12-05 DIAGNOSIS — Z5111 Encounter for antineoplastic chemotherapy: Secondary | ICD-10-CM | POA: Diagnosis present

## 2023-12-05 DIAGNOSIS — C787 Secondary malignant neoplasm of liver and intrahepatic bile duct: Secondary | ICD-10-CM | POA: Insufficient documentation

## 2023-12-05 DIAGNOSIS — Z79899 Other long term (current) drug therapy: Secondary | ICD-10-CM | POA: Diagnosis not present

## 2023-12-05 LAB — CMP (CANCER CENTER ONLY)
ALT: 29 U/L (ref 0–44)
AST: 32 U/L (ref 15–41)
Albumin: 4 g/dL (ref 3.5–5.0)
Alkaline Phosphatase: 69 U/L (ref 38–126)
Anion gap: 7 (ref 5–15)
BUN: 13 mg/dL (ref 8–23)
CO2: 24 mmol/L (ref 22–32)
Calcium: 8.5 mg/dL — ABNORMAL LOW (ref 8.9–10.3)
Chloride: 109 mmol/L (ref 98–111)
Creatinine: 0.72 mg/dL (ref 0.44–1.00)
GFR, Estimated: >60 mL/min
Glucose, Bld: 72 mg/dL (ref 70–99)
Potassium: 4.1 mmol/L (ref 3.5–5.1)
Sodium: 140 mmol/L (ref 135–145)
Total Bilirubin: 0.7 mg/dL (ref 0.0–1.2)
Total Protein: 6.6 g/dL (ref 6.5–8.1)

## 2023-12-05 LAB — CBC WITH DIFFERENTIAL (CANCER CENTER ONLY)
Abs Immature Granulocytes: 0.02 10*3/uL (ref 0.00–0.07)
Basophils Absolute: 0 10*3/uL (ref 0.0–0.1)
Basophils Relative: 1 %
Eosinophils Absolute: 0.1 10*3/uL (ref 0.0–0.5)
Eosinophils Relative: 3 %
HCT: 33.5 % — ABNORMAL LOW (ref 36.0–46.0)
Hemoglobin: 10.9 g/dL — ABNORMAL LOW (ref 12.0–15.0)
Immature Granulocytes: 0 %
Lymphocytes Relative: 29 %
Lymphs Abs: 1.3 10*3/uL (ref 0.7–4.0)
MCH: 32.2 pg (ref 26.0–34.0)
MCHC: 32.5 g/dL (ref 30.0–36.0)
MCV: 98.8 fL (ref 80.0–100.0)
Monocytes Absolute: 0.6 10*3/uL (ref 0.1–1.0)
Monocytes Relative: 13 %
Neutro Abs: 2.4 10*3/uL (ref 1.7–7.7)
Neutrophils Relative %: 54 %
Platelet Count: 151 10*3/uL (ref 150–400)
RBC: 3.39 MIL/uL — ABNORMAL LOW (ref 3.87–5.11)
RDW: 15 % (ref 11.5–15.5)
WBC Count: 4.5 10*3/uL (ref 4.0–10.5)
nRBC: 0 % (ref 0.0–0.2)

## 2023-12-05 MED ORDER — SODIUM CHLORIDE 0.9 % IV SOLN
1000.0000 mg/m2 | Freq: Once | INTRAVENOUS | Status: AC
  Administered 2023-12-05: 1710 mg via INTRAVENOUS
  Filled 2023-12-05: qty 44.97

## 2023-12-05 MED ORDER — HEPARIN SOD (PORK) LOCK FLUSH 100 UNIT/ML IV SOLN
500.0000 [IU] | Freq: Once | INTRAVENOUS | Status: AC | PRN
  Administered 2023-12-05: 500 [IU]
  Filled 2023-12-05: qty 5

## 2023-12-05 MED ORDER — SODIUM CHLORIDE 0.9 % IV SOLN: 2.0000 g | INTRAVENOUS | Status: DC | PRN

## 2023-12-05 MED ORDER — SODIUM CHLORIDE 0.9 % IV SOLN
INTRAVENOUS | Status: DC
  Filled 2023-12-05: qty 250

## 2023-12-05 MED ORDER — PACLITAXEL PROTEIN-BOUND CHEMO INJECTION 100 MG
100.0000 mg/m2 | Freq: Once | INTRAVENOUS | Status: AC
  Administered 2023-12-05: 175 mg via INTRAVENOUS
  Filled 2023-12-05: qty 35

## 2023-12-05 MED ORDER — CALCIUM GLUCONATE-NACL 2-0.675 GM/100ML-% IV SOLN
2.0000 g | Freq: Once | INTRAVENOUS | Status: AC
  Administered 2023-12-05: 2000 mg via INTRAVENOUS
  Filled 2023-12-05: qty 100

## 2023-12-05 NOTE — Assessment & Plan Note (Signed)
 Chemotherapy plan as listed above

## 2023-12-05 NOTE — Progress Notes (Signed)
 Patient wanted to let the doctor know that her Creon  expires in July. She is doing well, no new questions or concerns for the doctor today.

## 2023-12-05 NOTE — Progress Notes (Signed)
 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  Pathology proved liver metastatic disease- additional chemotherapy --> whipple procedure.ypT1b ypN0--> adjuvant Gem/Abraxane  finished in August 2023--> Sept 2024 CT showed progression in lung --> Nov 2024 Wedge biopsy- Recurrent pancreatic cancer with lung metastasis.  Currently on Gemcitabine  and Abraxane  1 week on 1 week off schedule due to neutropenia.  Labs are reviewed and discussed with patient.  Proceed with Gemcitabine  and Abraxane .  Plan to obtain repeat CT staging in July. Obtain [employer] circulating tumor DNA testing. Result is pending.     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  She did not tolerate Cymbalta .   Stable symptoms She gets acupuncture sessions which help her symptoms.   Encounter for antineoplastic chemotherapy Chemotherapy plan as listed above  Hypocalcemia Likely due to malabsorption. Continue calcium  supplementation, 2000 mg daily-divided in 2-3 doses IV calcium  gluconate 2g if calcium  <=8.5 Normal vitamin D  level   Pancreatic insufficiency S/p  Whipple procedure..  Continue Creon , continue 24000 units TID with meals and  with her snacks   No orders of the defined types were placed in this encounter.   Follow up  2 weeks lab MD Gemcitaibine Abraxane   All questions were answered. The patient knows to call the clinic with any problems, questions or  concerns.  Cassandra Forbes, MD, PhD [facility] Hematology Oncology 12/05/2023   Cassandra Forbes, MD    HISTORY OF PRESENTING ILLNESS:   Cassandra [name] is a  68 y.o.  female with presents for follow up of Stage IV pancreatic adenocarcinoma Patient initially presented with jaundice, transaminitis, bilirubin was 9.9.  CA 19-9 was 1874.  Patient also reports unintentional weight loss. 02/08/2020 MRI abdomen and MRCP with and without contrast was done at [facility] which showed pancreatic head mass measuring up to 3 cm, with marked associated narrowing of the portal vein confluence.  SMA is preserved.  Marked intrahepatic and extrahepatic biliary duct dilatation as well as mild dilatation of the main pancreatic duct. Multiple hepatic masses highly concerning for metastatic disease.  Patient underwent EUS on 02/19/2020, which showed irregular mass identified in the pancreatic head, hypoechoic, measured 21mmx33mm, sonographic evidence concerning for invasion into the superior mesenteric artery.  There is no sign of significant abnormality in the main pancreatic duct.  Dilatation of common bile duct which measured up to 16 mm.  Region of celiac artery was visualized and showed no signs of significant abnormality.  No lymphadenopathy.  FNA showed adenocarcinoma.  02/19/2020, ERCP, malignant.  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  The stricture was treated with placement of wall flex metal stent.  Patient was seen by [facility] oncology Dr. [name] and was recommended for 3 drug regimen FOLFIRINOX.  Patient prefers to do chemotherapy locally at [facility].  Patient was referred to establish care today. She denies any pain.  Since stent placement, skin jaundice has improved.  Itchiness has also improved. Patient was accompanied by her husband today.  She has a family history of  breast cancer in sister and paternal aunt, colon cancer paternal grandmother.  #No reportable  targetable mutation on NGS 9/14/2021cycle 1 FOLFIRINOX.  Patient received oxaliplatin  and about 50% of Irinotecan  on day 1 and had experienced neurologic symptoms.  She went to ER and working diagnosis is TIA, and eventually I think this is due to irinotecan  side effects -irinotecan -associated dysarthria, lip/tongue numbness.  Adjustment was made for Irinotecan  to be  infused over 180 minutes.  Atropine  0.5 mg once prior to the irinotecan . No recurrent symptoms.   # 03/19/2020-03/21/2020 patient was admitted due to sepsis with strep pneumonia bacteremia.  Patient was treated with IV Rocephin .  TEE was done which showed no vegetation.  No PFO or ASD.  Patient was discharged home and he finished full course of 14 days of IV Rocephin  on 04/02/2020 per ID recommendation.  Repeat blood culture was also negative.  08/25/20 cycle 10 FOLFIRINOX 09/08/20- present  Starting cycle 11, FOLFIRI, Oxaliplatin  discontinued due to neuropathy   #NGS showed no reportable targetable mutation #Genetic testing-[employer] diagnostic testing showed no pathological variants identified. MS stable, TMB 45mut/mb, KRAS G12D, SF3B1 K700E, TP53 V252fs*30  #01/14/2021  CT done at [facility] during the interval, stable disease.  # Her case was presented by Dr.Jia from [facility] tumor board. Susann Eon were 4~5 liver lesions on OSH MRI last year at the time of diagnosis highly suspicious for metastasis. She is not eligible for surgical protocol for metastasis resection given the number of liver metastasis is >3. However given her excellent and durable response to chemo, surgical resection may be considered pending sustained disease control at 1 year and may require partial hepatectomy first to see if there is viable tumor before proceeding to Whipple resection ]  Patient had a COVID-19 infection in September 2022.  03/23/2021 CT abdomen pelvis No significant change in the ill-defined pancreatic head mass or  associated biliary ductal and pancreatic ductal  dilation.  Slight interval enlargement of subcentimeter anterior peripancreatic  lymph node, nonspecific. Attention on follow-up per clinical protocol. Similar enlargement of the ascending thoracic aorta measuring 4.4 cm  03/23/2021 MRI abdomen w and wo  Increasing ill-defined hypoenhancement of the pancreatic head surrounding the common bile duct stent, in keeping with known pancreatic malignancy. Anterior peripancreatic lymph node is better evaluated on CT.  Unchanged position of a common bile duct stent with similar mild diffuse intrahepatic ductal dilation and pneumobilia. No evidence of metastatic disease in the abdomen or pelvis. Partially visualized cystic left adnexal lesion measuring up to 3.9 cm.   04/10/2021, patient underwent liver resection at [facility], by Dr. Zani.   Pathology report from [facility] was reviewed. Segments 3 partial hepatectomy, negative for viable tumor. Section 5/6 partial hepatectomy part 1 and 2, microscopic foci of residual viable adenocarcinoma, morphologically consistent with history of pancreatic primary.  Parenchymal margin is uninvolved.  #Patient resumed on FOLFIRI on 04/28/2021. She got another cycle on 05/12/2021 #05/26/2021, patient did not get additional chemotherapy due to transaminitis.  Shared decision was made to stop FOLFIRI and switch to gemcitabine  Abraxane  treatments.  05/26/2021, patient developed transaminitis, AST 763, ALT 691, alkaline phosphatase 364.  Bilirubin 0.7 05/26/2021 stat ultrasound abdomen right upper quadrant showed interval development of 3.3 x 1.8 cm complex mass in the right hepatic lobe.  Increased extrahepatic ductal dilatation.  Concerning for CBD obstruction. 05/29/2021, MRI abdomen MRCP with and without contrast showed unchanged pancreatic head soft tissue.  Severe intra and extrahepatic biliary ductal dilatation.  Common bile duct stents remain in position  however patency is not established.  No evidence of lymphadenopathy or  metastatic disease in the abdomen.  With further communication with radiologist, addendum was added that there is internal fluid signal and no appreciable associated contrast enhancement measuring 3.4 x 2.3 cm.  This appearance is generally not consistent with metastasis and is of uncertain significance.  Possibly reflecting hepatic abscess or residual of subcapsular hematoma.  Patient was seen by [facility] Dr. Barbara Levins team.  He had ERCP done on 06/05/2021, with findings of One stent from the biliary tree was seen in the major papilla. The stent had migrated significantly into the duodenum. This is the cause of stent malfunction. One stent was removed from the biliary tree. Prior biliary sphincterotomy appeared open. - A single severe biliary stricture was found in the lower third of the main bile duct with upstream dilation. The stricture was alignant appearing.- The biliary tree was swept and sludge was found.- One fully covered metal stent was placed into the common bile duct across the stricture.- No pancreatogram performed.  06/12/2021-08/10/2021 gemcitabine  and Abraxane .   10/14/2021, patient underwent Whipple procedure. Liver biopsy negative for malignancy. Review procedure showed invasive adenocarcinoma, moderately differentiated, centered in pancreatic head and confined in the Pancreas. Surgical margin is negative for malignancy.  36 lymph nodes were all negative for malignancy.  1 hepatic artery lymph node was harvested and was negative.  Gallbladder negative for malignancy.  Cystic duct excision negative for malignancy. pT1b pN0  10/25/2021, CT abdomen pelvis with contrast showed rim-enhancing fluid collection in the region of the hepatic hilum. Patient had a readmission for superficial wound infection.  She completed antibiotics on 11/03/2021.  11/03/2021, CT abdomen pelvis with contrast showed near resolution of previously seen fluid collection in the region of the hepatic hilum.  Similar appearance of  the area of hypoenhancement in the liver.  Attention on follow-up.  11/17/2021, resumed on gemcitabine /Abraxane . 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  12/12/ 2023 CT scan at [facility] shows no evidence of cancer recurrence.-NED 09/21/2022 CT chest abdomen pelvis w contrast at [facility] showed-No evidence of metastatic disease in the chest, abdomen, or pelvis. 12/21/2022 CT chest abdomen pelvis w contrast at [facility] showed-No evidence of metastatic disease in the chest, abdomen, or pelvis.   03/22/2023 CT scan at [facility] showed enlarging lung nodules. RLL 1cm, LLL 0.5cm, RUL 0.5cm Stable hypoattenuating right inferior hepatic lobe lesion measuring 0.9 cm.  Similar left adnexal cystic lesion measuring up to 5.0 cm.   04/06/2023 s/p lung nodule biopsy.  Rare atypical epithelioid cells present with evidence suggestive of invasion, suspicious but not diagnostic for malignancy.   05/20/2023: Right robot assisted thoracoscopic wedge resection with MLND (Dr. Sandford Croon)   Pathology:  Suspicious for malignancy but not diagnostic A. Lymph node, level 8, biopsy: Metastatic carcinoma involving one lymph node (1/1).  B. Lung, right lower lobe, wedge resection: Lung tissue positive for adenocarcinoma, consistent with metastasis from pancreas primary. See comment. Tumor size: 1.0 cm. Operative margin: Negative  Comment: Note is made of the patient's history of invasive adenocarcinoma of the pancreatic head ([id number]; 10/14/2021), which is compared to the current case. Comparison is challenging given the post-treatment nature of the pancreatic tumor; however, there are some morphologic similarities.  Although often unhelpful in the setting of lung primary versus pancreatic primary, immunohistochemical stains were performed. Tumor cells demonstrate the below immunoprofile which would could support pulmonary adenocarcinoma with enteric differentiation but would also be consistent with metastasis from the patient's known  pancreatic  primary; the latter is favored.              Positive                             Negative CK7 (strong, diffuse) TTF-1  CK20 (strong, focal)                    Positive and negative controls perform appropriately.    C. Lymph node, level 7, biopsy: Metastatic carcinoma involving one lymph node (1/1).  D. Lymph node, level 4R, biopsy: One lymph node, negative for malignancy (0/1).   INTERVAL HISTORY LASHAUN POCH is a 68 y.o. female who has above history reviewed by me today presents for stage IV pancreatic cancer.   She tolerates Gemcitabine  Abraxane  1 week on and 1 week off, overall she tolerates well.  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  She gets acupuncture session which helped her symptoms.  She takes calcium  supplementation as instructed.  She take Creon  for pancreatic insufficiency need refills.    Review of Systems  Constitutional:  Positive for fatigue. Negative for appetite change, chills, fever and unexpected weight change.  HENT:   Negative for hearing loss and voice change.   Eyes:  Negative for eye problems.  Respiratory:  Negative for chest tightness and cough.   Cardiovascular:  Negative for chest pain.  Gastrointestinal:  Negative for abdominal distention, abdominal pain, blood in stool and nausea.  Endocrine: Negative for hot flashes.  Genitourinary:  Negative for difficulty urinating and frequency.   Musculoskeletal:  Negative for arthralgias.  Skin:  Negative for itching and rash.  Neurological:  Positive for numbness. Negative for extremity weakness.  Hematological:  Negative for adenopathy.  Psychiatric/Behavioral:  Negative for confusion.     MEDICAL HISTORY:  Past Medical History:  Diagnosis Date   Allergy    Anemia    Bacteremia due to Streptococcus pneumoniae 03/19/2020   Cancer [facility])    pancreatic cancer   Colon polyps    Family history of breast cancer    Neuropathy due to  drug (HCC)    chemo induced   Neutropenia (HCC) 04/18/2020   Osteopenia after menopause 05/2017   femoral neck T score -2.0   Personal history of chemotherapy    current for pancreatic ca   PONV (postoperative nausea and vomiting)    Pure hypercholesterolemia    Sepsis (HCC) 03/19/2020    SURGICAL HISTORY: Past Surgical History:  Procedure Laterality Date   COLONOSCOPY  07/2015   WNL   COLONOSCOPY  2008/2011   COLONOSCOPY WITH PROPOFOL  N/A 07/10/2021   Procedure: COLONOSCOPY WITH PROPOFOL ;  Surgeon: Shane Darling, MD;  Location: [facility] ENDOSCOPY;  Service: Endoscopy;  Laterality: N/A;   IR CV LINE INJECTION  04/28/2021   OVARIAN CYST REMOVAL  1992   dermoid-Dr CAK   PORTA CATH INSERTION N/A 03/07/2020   Procedure: PORTA CATH INSERTION;  Surgeon: Celso College, MD;  Location: [facility] INVASIVE CV LAB;  Service: Cardiovascular;  Laterality: N/A;   TEE WITHOUT CARDIOVERSION N/A 03/21/2020   Procedure: TRANSESOPHAGEAL ECHOCARDIOGRAM (TEE);  Surgeon: Cherrie Cornwall, MD;  Location: [facility] ORS;  Service: Cardiovascular;  Laterality: N/A;   TUBAL LIGATION  1993    SOCIAL HISTORY: Social History   Socioeconomic History   Marital status: Married    Spouse name: Not on file   Number of  children: 2   Years of education: Not on file   Highest education level: Bachelor's degree (e.g., BA, AB, BS)  Occupational History   Occupation: Runner, broadcasting/film/video    Comment: retired   Occupation: Visual merchandiser  Tobacco Use   Smoking status: Former    Current packs/day: 0.00    Average packs/day: 1 pack/day for 10.0 years (10.0 ttl pk-yrs)    Types: Cigarettes    Start date: 06/29/1975    Quit date: 06/28/1985    Years since quitting: 38.4   Smokeless tobacco: Never   Tobacco comments:    Quit smoking 1987  Vaping Use   Vaping status: Never Used  Substance and Sexual Activity   Alcohol use: Yes    Alcohol/week: 5.0 standard drinks of alcohol    Types: 5 Glasses of wine per week    Comment: 0-2 mixed drinks a  day   Drug use: No   Sexual activity: Not Currently    Birth control/protection: Post-menopausal  Other Topics Concern   Not on file  Social History Narrative   Not on file   Social Drivers of Health   Financial Resource Strain: Low Risk  (10/18/2023)   Overall Financial Resource Strain (CARDIA)    Difficulty of Paying Living Expenses: Not hard at all  Food Insecurity: No Food Insecurity (10/18/2023)   Hunger Vital Sign    Worried About Running Out of Food in the Last Year: Never true    Ran Out of Food in the Last Year: Never true  Transportation Needs: No Transportation Needs (10/18/2023)   PRAPARE - Administrator, Civil Service (Medical): No    Lack of Transportation (Non-Medical): No  Physical Activity: Sufficiently Active (10/18/2023)   Exercise Vital Sign    Days of Exercise per Week: 6 days    Minutes of Exercise per Session: 30 min  Stress: No Stress Concern Present (10/18/2023)   [employer] of Occupational Health - Occupational Stress Questionnaire    Feeling of Stress : Not at all  Social Connections: Moderately Integrated (10/18/2023)   Social Connection and Isolation Panel [NHANES]    Frequency of Communication with Friends and Family: More than three times a week    Frequency of Social Gatherings with Friends and Family: More than three times a week    Attends Religious Services: More than 4 times per year    Active Member of [employer] or Organizations: No    Attends Banker Meetings: Never    Marital Status: Married  Catering manager Violence: Not At Risk (10/18/2023)   Humiliation, Afraid, Rape, and Kick questionnaire    Fear of Current or Ex-Partner: No    Emotionally Abused: No    Physically Abused: No    Sexually Abused: No    FAMILY HISTORY: Family History  Problem Relation Age of Onset   Breast cancer Paternal Aunt 2   Diabetes Mother    Osteoporosis Mother    Hyperlipidemia Father    Rheumatic fever Father    Valvular  heart disease Father    Cancer Paternal Grandmother        possible colon   Breast cancer Sister 87    ALLERGIES:  is allergic to penicillin g.  MEDICATIONS:  Current Outpatient Medications  Medication Sig Dispense Refill   aspirin  81 MG chewable tablet Chew 81 mg by mouth daily.     b complex vitamins capsule Take 1 capsule by mouth daily.     Calcium  Carbonate-Vit D-Min (CALCIUM  1200  PO) Take by mouth.     Cholecalciferol  25 MCG (1000 UT) tablet Take 2,000 Units by mouth daily.     clobetasol  cream (TEMOVATE ) 0.05 %      fluticasone (FLONASE) 50 MCG/ACT nasal spray Place 1 spray into both nostrils daily.     lidocaine -prilocaine  (EMLA ) cream Apply small amount to port and cover with saran wrap 1-2 hours prior to port access 30 g 6   loratadine (CLARITIN REDITABS) 10 MG dissolvable tablet Take 10 mg by mouth daily.     Melatonin 2.5 MG CHEW Chew by mouth.     Multiple Vitamins-Minerals (MULTIVITAMIN WITH MINERALS) tablet Take 1 tablet by mouth daily.     Pancrelipase , Lip-Prot-Amyl, (CREON ) 24000-76000 units CPEP TAKE 1 CAPSULE THREE TIMES DAILY BEFORE MEALS 200 capsule 3   prochlorperazine  (COMPAZINE ) 10 MG tablet Take 10 mg by mouth every 6 (six) hours as needed for nausea or vomiting.     senna-docusate (SENOKOT-S) 8.6-50 MG tablet Take by mouth.     simethicone  (GAS-X) 80 MG chewable tablet Chew 1 tablet (80 mg total) by mouth every 8 (eight) hours as needed for flatulence. 60 tablet 0   No current facility-administered medications for this visit.   Facility-Administered Medications Ordered in Other Visits  Medication Dose Route Frequency Provider Last Rate Last Admin   0.9 %  sodium chloride  infusion   Intravenous Continuous Cassandra Forbes, MD 10 mL/hr at 09/12/23 1241 Infusion Verify at 09/12/23 1241   0.9 %  sodium chloride  infusion   Intravenous Continuous Cassandra Forbes, MD   Stopped at 12/05/23 1220   calcium  gluconate 2 g in sodium chloride  0.9 % 100 mL IVPB  2 g Intravenous PRN Cassandra Forbes, MD   Stopped at 09/12/23 1101   heparin  lock flush 100 UNIT/ML injection            prochlorperazine  (COMPAZINE ) 10 MG tablet            sodium chloride  flush (NS) 0.9 % injection 10 mL  10 mL Intravenous PRN Cassandra Forbes, MD   10 mL at 02/18/21 0858   sodium chloride  flush (NS) 0.9 % injection 10 mL  10 mL Intravenous Once Borders, Carlene Che, NP       sodium chloride  flush (NS) 0.9 % injection 10 mL  10 mL Intravenous Once Cassandra Forbes, MD         PHYSICAL EXAMINATION: ECOG PERFORMANCE STATUS: 0 - Asymptomatic Vitals:   12/05/23 0905  BP: 130/83  Pulse: (!) 54  Resp: 18  Temp: 97.7 F (36.5 C)  SpO2: 100%   Filed Weights   12/05/23 0905  Weight: 135 lb (61.2 kg)     Physical Exam Constitutional:      General: She is not in acute distress. HENT:     Head: Normocephalic and atraumatic.  Eyes:     General: No scleral icterus. Cardiovascular:     Rate and Rhythm: Normal rate and regular rhythm.     Heart sounds: Normal heart sounds.  Pulmonary:     Effort: Pulmonary effort is normal. No respiratory distress.  Abdominal:     General: Bowel sounds are normal. There is no distension.     Palpations: Abdomen is soft.  Musculoskeletal:        General: No deformity. Normal range of motion.     Cervical back: Normal range of motion and neck supple.  Skin:    General: Skin is warm.     Coloration: Skin is not  jaundiced.     Findings: No erythema.  Neurological:     Mental Status: She is alert and oriented to person, place, and time. Mental status is at baseline.  Psychiatric:        Mood and Affect: Mood normal.     LABORATORY DATA:  I have reviewed the data as listed    Latest Ref Rng & Units 12/05/2023    8:50 AM 11/22/2023    1:08 PM 11/07/2023   12:54 PM  CBC  WBC 4.0 - 10.5 K/uL 4.5  5.9  5.6   Hemoglobin 12.0 - 15.0 g/dL 16.1  09.6  04.5   Hematocrit 36.0 - 46.0 % 33.5  34.4  34.5   Platelets 150 - 400 K/uL 151  172  176       Latest Ref Rng & Units 12/05/2023     8:50 AM 11/22/2023    1:08 PM 11/07/2023   12:54 PM  CMP  Glucose 70 - 99 mg/dL 72  409  811   BUN 8 - 23 mg/dL 13  15  17    Creatinine 0.44 - 1.00 mg/dL 9.14  7.82  9.56   Sodium 135 - 145 mmol/L 140  138  139   Potassium 3.5 - 5.1 mmol/L 4.1  3.7  3.7   Chloride 98 - 111 mmol/L 109  108  106   CO2 22 - 32 mmol/L 24  22  24    Calcium  8.9 - 10.3 mg/dL 8.5  8.6  8.7   Total Protein 6.5 - 8.1 g/dL 6.6  6.7  6.5   Total Bilirubin 0.0 - 1.2 mg/dL 0.7  0.7  0.7   Alkaline Phos 38 - 126 U/L 69  76  81   AST 15 - 41 U/L 32  32  31   ALT 0 - 44 U/L 29  29  29       RADIOGRAPHIC STUDIES: I have personally reviewed the radiological images as listed and agreed with the findings in the report. Reviewed findings of MRI abdomen MRCP [facility]. CT CHEST ABDOMEN PELVIS W CONTRAST Result Date: 10/09/2023 CLINICAL DATA:  Pancreatic cancer restaging, ongoing chemotherapy status post Whipple procedure * Tracking Code: BO * EXAM: CT CHEST, ABDOMEN, AND PELVIS WITH CONTRAST TECHNIQUE: Multidetector CT imaging of the chest, abdomen and pelvis was performed following the standard protocol during bolus administration of intravenous contrast. RADIATION DOSE REDUCTION: This exam was performed according to the departmental dose-optimization program which includes automated exposure control, adjustment of the mA and/or kV according to patient size and/or use of iterative reconstruction technique. CONTRAST:  OMNIPAQUE  IOHEXOL  300 MG/ML  SOLN COMPARISON:  06/27/2023 FINDINGS: CT CHEST FINDINGS Cardiovascular: Unchanged enlargement of the tubular ascending thoracic aorta measuring up to 4.5 x 4.4 cm (series 2, image 31). Scattered aortic atherosclerosis normal heart size. No pericardial effusion. Mediastinum/Nodes: No enlarged mediastinal, hilar, or axillary lymph nodes. Thyroid gland, trachea, and esophagus demonstrate no significant findings. Lungs/Pleura: Bandlike scarring of the right lung base, unchanged. No  significant change in an irregular nodule of the dependent left upper lobe measuring 0.6 cm (series 4, image 41). No significant change in adjacent nodules of the dependent left lower lobe, a cavitary nodule measuring 0.9 x 0.7 cm (series 4, image 77) and an adjacent solid nodule measuring 0.5 cm (series 4, image 72). Unchanged small nodule of the anterior inferior right upper lobe measuring 0.5 cm (series 4, image 71). No new nodules. No pleural effusion or pneumothorax. Musculoskeletal:  No chest wall abnormality. No acute osseous findings. CT ABDOMEN PELVIS FINDINGS Hepatobiliary: Unchanged, very subtle hypodense lesion of the inferior margin of hepatic segment V adjacent to the gallbladder fossa measuring 0.8 cm (series 2, image 70). Status post cholecystectomy and hepatojejunostomy. No biliary dilatation. Pancreas: Status post Whipple pancreaticoduodenectomy. Diminished size and solid character of soft tissue at the right aspect of the central superior mesenteric vein, on today's examination measuring 1.0 x 0.8 cm, previously 1.3 x 1.1 cm (series 2, image 75). No pancreatic ductal dilatation or surrounding inflammatory changes. Spleen: Normal in size without significant abnormality. Adrenals/Urinary Tract: Adrenal glands are unremarkable. Kidneys are normal, without renal calculi, solid lesion, or hydronephrosis. Bladder is unremarkable. Stomach/Bowel: Status post distal gastrectomy and gastrojejunostomy. Appendix appears normal. No evidence of bowel wall thickening, distention, or inflammatory changes. Vascular/Lymphatic: Aortic atherosclerosis. No enlarged abdominal or pelvic lymph nodes. Reproductive: No mass. Unchanged left ovarian cyst measuring 5.1 x 3.1 cm (series 2, image 102). Other: No abdominal wall hernia or abnormality. No ascites. Musculoskeletal: No acute osseous findings. IMPRESSION: 1. Status post Whipple pancreaticoduodenectomy. Diminished size and solid character of soft tissue at the right  aspect of the central superior mesenteric vein, on today's examination measuring 1.0 x 0.8 cm, previously 1.3 x 1.1 cm which remains highly suspicious for local recurrence. 2. Unchanged, very subtle hypodense lesion of the inferior margin of hepatic segment V adjacent to the gallbladder fossa measuring 0.8 cm. Continued attention on follow-up. 3. No significant change in small bilateral pulmonary nodules, highly suspicious for small pulmonary metastases. 4. Unchanged left ovarian cyst measuring 5.1 x 3.1 cm. Although abnormal in the late postmenopausal setting, this is probably benign and attention on follow-up is sufficient in the setting of known malignancy. 5. Unchanged enlargement of the tubular ascending thoracic aorta measuring up to 4.5 x 4.4 cm. Ascending thoracic aortic aneurysm. Attention on follow-up in the setting of known primary malignancy. Aortic Atherosclerosis (ICD10-I70.0). Electronically Signed   By: Fredricka Jenny M.D.   On: 10/09/2023 16:57

## 2023-12-05 NOTE — Assessment & Plan Note (Signed)
 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 Continue calcium supplementation, 2000 mg daily-divided in 2-3 doses IV calcium gluconate 2g if calcium <=8.5 Normal vitamin D level

## 2023-12-05 NOTE — Assessment & Plan Note (Signed)
 Mostly bothering her toes, grade 2.  She did not tolerate Cymbalta .   Stable symptoms She gets acupuncture sessions which help her symptoms.

## 2023-12-05 NOTE — Patient Instructions (Signed)
 [facility]  Discharge Instructions: Thank you for choosing [facility] to provide your oncology and hematology care.  If you have a lab appointment with the Cancer Center, please go directly to the Cancer Center and check in at the registration area.  Wear comfortable clothing and clothing appropriate for easy access to any Portacath or PICC line.   We strive to give you quality time with your provider. You may need to reschedule your appointment if you arrive late (15 or more minutes).  Arriving late affects you and other patients whose appointments are after yours.  Also, if you miss three or more appointments without notifying the office, you may be dismissed from the clinic at the provider's discretion.      For prescription refill requests, have your pharmacy contact our office and allow 72 hours for refills to be completed.    Today you received the following chemotherapy and/or immunotherapy agents Gemzar & Abraxane      To help prevent nausea and vomiting after your treatment, we encourage you to take your nausea medication as directed.  BELOW ARE SYMPTOMS THAT SHOULD BE REPORTED IMMEDIATELY: *FEVER GREATER THAN 100.4 F (38 C) OR HIGHER *CHILLS OR SWEATING *NAUSEA AND VOMITING THAT IS NOT CONTROLLED WITH YOUR NAUSEA MEDICATION *UNUSUAL SHORTNESS OF BREATH *UNUSUAL BRUISING OR BLEEDING *URINARY PROBLEMS (pain or burning when urinating, or frequent urination) *BOWEL PROBLEMS (unusual diarrhea, constipation, pain near the anus) TENDERNESS IN MOUTH AND THROAT WITH OR WITHOUT PRESENCE OF ULCERS (sore throat, sores in mouth, or a toothache) UNUSUAL RASH, SWELLING OR PAIN  UNUSUAL VAGINAL DISCHARGE OR ITCHING   Items with * indicate a potential emergency and should be followed up as soon as possible or go to the Emergency Department if any problems should occur.  Please show the CHEMOTHERAPY ALERT CARD or  IMMUNOTHERAPY ALERT CARD at check-in to the Emergency Department and triage nurse.  Should you have questions after your visit or need to cancel or reschedule your appointment, please contact [facility] - A DEPT OF [facility]  [phone] and follow the prompts.  Office hours are 8:00 a.m. to 4:30 p.m. Monday - Friday. Please note that voicemails left after 4:00 p.m. may not be returned until the following business day.  We are closed weekends and major holidays. You have access to a nurse at all times for urgent questions. Please call the main number to the clinic [phone] and follow the prompts.  For any non-urgent questions, you may also contact your provider using MyChart. We now offer e-Visits for anyone 33 and older to request care online for non-urgent symptoms. For details visit [email or web address].   Also download the MyChart app! Go to the app store, search "MyChart", open the app, select [place], and log in with your MyChart username and password.

## 2023-12-05 NOTE — Assessment & Plan Note (Addendum)
 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  Pathology proved liver metastatic disease- additional chemotherapy --> whipple procedure.ypT1b ypN0--> adjuvant Gem/Abraxane  finished in August 2023--> Sept 2024 CT showed progression in lung --> Nov 2024 Wedge biopsy- Recurrent pancreatic cancer with lung metastasis.  Currently on Gemcitabine  and Abraxane  1 week on 1 week off schedule due to neutropenia.  Labs are reviewed and discussed with patient.  Proceed with Gemcitabine  and Abraxane .  Plan to obtain repeat CT staging in July. Obtain Signatera circulating tumor DNA testing. Result is pending.

## 2023-12-05 NOTE — Assessment & Plan Note (Signed)
 S/p  Whipple procedure..  Continue Creon, continue [id number] units TID with meals and  with her snacks

## 2023-12-06 ENCOUNTER — Other Ambulatory Visit: Payer: Self-pay | Admitting: Family Medicine

## 2023-12-06 DIAGNOSIS — Z1231 Encounter for screening mammogram for malignant neoplasm of breast: Secondary | ICD-10-CM

## 2023-12-06 LAB — CANCER ANTIGEN 19-9: CA 19-9: 8 U/mL (ref 0–35)

## 2023-12-06 LAB — CEA: CEA: 5.3 ng/mL — ABNORMAL HIGH (ref 0.0–4.7)

## 2023-12-17 LAB — SIGNATERA
SIGNATERA MTM READOUT: 0 MTM/ml
SIGNATERA TEST RESULT: NEGATIVE

## 2023-12-19 ENCOUNTER — Inpatient Hospital Stay

## 2023-12-19 ENCOUNTER — Ambulatory Visit: Admitting: Oncology

## 2023-12-19 ENCOUNTER — Inpatient Hospital Stay (HOSPITAL_BASED_OUTPATIENT_CLINIC_OR_DEPARTMENT_OTHER): Admitting: Oncology

## 2023-12-19 ENCOUNTER — Encounter: Payer: Self-pay | Admitting: Oncology

## 2023-12-19 ENCOUNTER — Ambulatory Visit

## 2023-12-19 VITALS — BP 143/83 | HR 60 | Temp 98.9°F | Resp 18 | Wt 137.6 lb

## 2023-12-19 VITALS — BP 151/84 | HR 50 | Temp 97.1°F | Resp 17

## 2023-12-19 DIAGNOSIS — K8689 Other specified diseases of pancreas: Secondary | ICD-10-CM

## 2023-12-19 DIAGNOSIS — G620 Drug-induced polyneuropathy: Secondary | ICD-10-CM | POA: Diagnosis not present

## 2023-12-19 DIAGNOSIS — C259 Malignant neoplasm of pancreas, unspecified: Secondary | ICD-10-CM | POA: Diagnosis not present

## 2023-12-19 DIAGNOSIS — Z5111 Encounter for antineoplastic chemotherapy: Secondary | ICD-10-CM | POA: Diagnosis not present

## 2023-12-19 DIAGNOSIS — T451X5A Adverse effect of antineoplastic and immunosuppressive drugs, initial encounter: Secondary | ICD-10-CM

## 2023-12-19 DIAGNOSIS — M542 Cervicalgia: Secondary | ICD-10-CM | POA: Insufficient documentation

## 2023-12-19 LAB — CBC WITH DIFFERENTIAL (CANCER CENTER ONLY)
Abs Immature Granulocytes: 0.02 10*3/uL (ref 0.00–0.07)
Basophils Absolute: 0 10*3/uL (ref 0.0–0.1)
Basophils Relative: 1 %
Eosinophils Absolute: 0.1 10*3/uL (ref 0.0–0.5)
Eosinophils Relative: 1 %
HCT: 32.5 % — ABNORMAL LOW (ref 36.0–46.0)
Hemoglobin: 10.9 g/dL — ABNORMAL LOW (ref 12.0–15.0)
Immature Granulocytes: 0 %
Lymphocytes Relative: 25 %
Lymphs Abs: 1.6 10*3/uL (ref 0.7–4.0)
MCH: 32.6 pg (ref 26.0–34.0)
MCHC: 33.5 g/dL (ref 30.0–36.0)
MCV: 97.3 fL (ref 80.0–100.0)
Monocytes Absolute: 0.7 10*3/uL (ref 0.1–1.0)
Monocytes Relative: 11 %
Neutro Abs: 4.2 10*3/uL (ref 1.7–7.7)
Neutrophils Relative %: 62 %
Platelet Count: 183 10*3/uL (ref 150–400)
RBC: 3.34 MIL/uL — ABNORMAL LOW (ref 3.87–5.11)
RDW: 15 % (ref 11.5–15.5)
WBC Count: 6.7 10*3/uL (ref 4.0–10.5)
nRBC: 0 % (ref 0.0–0.2)

## 2023-12-19 LAB — CMP (CANCER CENTER ONLY)
ALT: 28 U/L (ref 0–44)
AST: 30 U/L (ref 15–41)
Albumin: 4 g/dL (ref 3.5–5.0)
Alkaline Phosphatase: 75 U/L (ref 38–126)
Anion gap: 8 (ref 5–15)
BUN: 15 mg/dL (ref 8–23)
CO2: 23 mmol/L (ref 22–32)
Calcium: 8.5 mg/dL — ABNORMAL LOW (ref 8.9–10.3)
Chloride: 107 mmol/L (ref 98–111)
Creatinine: 0.67 mg/dL (ref 0.44–1.00)
GFR, Estimated: >60 mL/min (ref >60)
Glucose, Bld: 126 mg/dL — ABNORMAL HIGH (ref 70–99)
Potassium: 3.7 mmol/L (ref 3.5–5.1)
Sodium: 138 mmol/L (ref 135–145)
Total Bilirubin: 0.7 mg/dL (ref 0.0–1.2)
Total Protein: 6.4 g/dL — ABNORMAL LOW (ref 6.5–8.1)

## 2023-12-19 MED ORDER — SODIUM CHLORIDE 0.9 % IV SOLN
1000.0000 mg/m2 | Freq: Once | INTRAVENOUS | Status: AC
  Administered 2023-12-19: 1710 mg via INTRAVENOUS
  Filled 2023-12-19: qty 44.97

## 2023-12-19 MED ORDER — SODIUM CHLORIDE 0.9 % IV SOLN
INTRAVENOUS | Status: DC
  Filled 2023-12-19: qty 250

## 2023-12-19 MED ORDER — HEPARIN SOD (PORK) LOCK FLUSH 100 UNIT/ML IV SOLN
500.0000 [IU] | Freq: Once | INTRAVENOUS | Status: AC | PRN
  Administered 2023-12-19: 500 [IU]
  Filled 2023-12-19: qty 5

## 2023-12-19 MED ORDER — CALCIUM GLUCONATE-NACL 2-0.675 GM/100ML-% IV SOLN
2.0000 g | Freq: Once | INTRAVENOUS | Status: AC
  Administered 2023-12-19: 2000 mg via INTRAVENOUS
  Filled 2023-12-19: qty 100

## 2023-12-19 MED ORDER — SODIUM CHLORIDE 0.9 % IV SOLN: 2.0000 g | INTRAVENOUS | Status: DC | PRN

## 2023-12-19 MED ORDER — PACLITAXEL PROTEIN-BOUND CHEMO INJECTION 100 MG
100.0000 mg/m2 | Freq: Once | INTRAVENOUS | Status: AC
  Administered 2023-12-19: 175 mg via INTRAVENOUS
  Filled 2023-12-19: qty 35

## 2023-12-19 NOTE — Assessment & Plan Note (Signed)
 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 Continue calcium supplementation, 2000 mg daily-divided in 2-3 doses IV calcium gluconate 2g if calcium <=8.5 Normal vitamin D level

## 2023-12-19 NOTE — Assessment & Plan Note (Signed)
 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  Pathology proved liver metastatic disease- additional chemotherapy --> whipple procedure.ypT1b ypN0--> adjuvant Gem/Abraxane  finished in August 2023--> Sept 2024 CT showed progression in lung --> Nov 2024 Wedge biopsy- Recurrent pancreatic cancer with lung metastasis.  Currently on Gemcitabine  and Abraxane  1 week on 1 week off schedule due to neutropenia.  Labs are reviewed and discussed with patient.  Proceed with Gemcitabine  and Abraxane .  Plan to obtain repeat CT staging in July. Signatera circulating tumor DNA testing- negative.

## 2023-12-19 NOTE — Assessment & Plan Note (Signed)
 S/p  Whipple procedure..  Continue Creon, continue [id number] units TID with meals and  with her snacks

## 2023-12-19 NOTE — Assessment & Plan Note (Addendum)
 Possible muscularskeletal.  She sees chiropractor and get massage sesssions.  Discussed about option of Flexeril and she declined.

## 2023-12-19 NOTE — Progress Notes (Signed)
 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  Pathology proved liver metastatic disease- additional chemotherapy --> whipple procedure.ypT1b ypN0--> adjuvant Gem/Abraxane  finished in August 2023--> Sept 2024 CT showed progression in lung --> Nov 2024 Wedge biopsy- Recurrent pancreatic cancer with lung metastasis.  Currently on Gemcitabine  and Abraxane  1 week on 1 week off schedule due to neutropenia.  Labs are reviewed and discussed with patient.  Proceed with Gemcitabine  and Abraxane .  Plan to obtain repeat CT staging in July. Signatera circulating tumor DNA testing- negative.    Pancreatic insufficiency S/p  Whipple procedure..  Continue Creon , continue 24000 units TID with meals and  with her snacks   Hypocalcemia Likely due to malabsorption. 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  She did not tolerate Cymbalta .   Stable symptoms She gets acupuncture sessions which help her symptoms.   Neck pain Possible muscularskeletal.  She sees chiropractor and get massage sesssions.  Discussed about option of Flexeril and she declined.    Orders Placed This Encounter  Procedures   CT CHEST ABDOMEN PELVIS W CONTRAST     Standing Status:   Future    Expected Date:   01/02/2024    Expiration Date:   12/18/2024    If indicated for the ordered procedure, I authorize the administration of contrast media per Radiology protocol:   Yes    Does the patient have a contrast media/X-ray dye allergy?:   No    Preferred imaging location?:   [facility]    If indicated for the ordered procedure, I authorize the administration of oral contrast media per Radiology protocol:   Yes   Cancer antigen 19-9    Standing Status:   Future    Expected Date:   01/05/2024    Expiration Date:   01/04/2025   CEA    Standing Status:   Future    Expected Date:   01/05/2024    Expiration Date:   01/04/2025   CBC with Differential (Cancer Center Only)    Standing Status:   Future    Expected Date:   01/05/2024    Expiration Date:   01/04/2025   CMP (Cancer Center only)    Standing Status:   Future    Expected Date:   01/05/2024    Expiration Date:   01/04/2025   CBC with Differential (Cancer Center Only)    Standing Status:   Future    Expected Date:   01/23/2024    Expiration Date:   01/22/2025   CMP (Cancer Center only)    Standing Status:   Future    Expected Date:   01/23/2024    Expiration Date:   01/22/2025    Follow up  2 weeks lab MD Gemcitaibine Abraxane   All questions were answered. The patient knows to call the clinic with any problems, questions or concerns.  Call  Babara, MD, PhD [facility] Hematology Oncology 12/19/2023   Babara Call, MD    HISTORY OF PRESENTING ILLNESS:   Cassandra [name] is a  68 y.o.  female with presents for follow up of Stage IV pancreatic adenocarcinoma Patient initially presented with jaundice, transaminitis, bilirubin was 9.9.  CA 19-9 was 1874.  Patient also reports unintentional weight loss. 02/08/2020 MRI abdomen and MRCP with and without contrast was done at [facility] which showed pancreatic head mass measuring up to 3 cm, with marked associated narrowing of the portal vein confluence.  SMA is  preserved.  Marked intrahepatic and extrahepatic biliary duct dilatation as well as mild dilatation of the main pancreatic duct. Multiple hepatic masses highly concerning for metastatic disease.  Patient underwent EUS on 02/19/2020, which showed irregular mass identified in the pancreatic head, hypoechoic, measured 74mmx33mm, sonographic evidence concerning for invasion into the superior mesenteric artery.  There is no sign of significant abnormality in the main pancreatic duct.  Dilatation of common bile duct which measured up to 16 mm.  Region of celiac artery was visualized and showed no signs of significant abnormality.  No lymphadenopathy.  FNA showed adenocarcinoma.  02/19/2020, ERCP, malignant.  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  The stricture was treated with placement of wall flex metal stent.  Patient was seen by [facility] oncology Dr. [name] and was recommended for 3 drug regimen FOLFIRINOX.  Patient prefers to do chemotherapy locally at [facility].  Patient was referred to establish care today. She denies any pain.  Since stent placement, skin jaundice has improved.  Itchiness has also improved. Patient was accompanied by her husband today.  She has a family history of breast cancer in sister and paternal aunt, colon cancer paternal grandmother.  #No reportable targetable mutation on NGS 9/14/2021cycle 1 FOLFIRINOX.  Patient received oxaliplatin  and about 50% of Irinotecan  on day 1 and had experienced neurologic symptoms.  She went to ER and working diagnosis is TIA, and eventually I think this is due to irinotecan  side effects -irinotecan -associated dysarthria, lip/tongue numbness.  Adjustment was made for Irinotecan  to be  infused over 180 minutes.  Atropine  0.5 mg once prior to the irinotecan . No recurrent symptoms.   # 03/19/2020-03/21/2020 patient was admitted due to sepsis with strep pneumonia bacteremia.  Patient was treated  with IV Rocephin .  TEE was done which showed no vegetation.  No PFO or ASD.  Patient was discharged home and he finished full course of 14 days of IV Rocephin  on 04/02/2020 per ID recommendation.  Repeat blood culture was also negative.  08/25/20 cycle 10 FOLFIRINOX 09/08/20- present  Starting cycle 11, FOLFIRI, Oxaliplatin  discontinued due to neuropathy   #NGS showed no reportable targetable mutation #Genetic testing-[employer] diagnostic testing showed no pathological variants identified. MS stable, TMB 0mut/mb, KRAS G12D, SF3B1 K700E, TP53 V260fs*30  #01/14/2021  CT done at [facility] during the interval, stable disease.  # Her case was presented by Dr.Jia from [facility] tumor board. [name] were 4~5 liver lesions on OSH MRI last year at the time of diagnosis highly suspicious for metastasis. She is not eligible for surgical protocol for metastasis resection given the number of liver metastasis is >3. However given her excellent and durable response to chemo, surgical resection may be considered pending sustained disease control at 1 year and may require partial hepatectomy first to see if there is viable tumor before proceeding to Whipple resection ]  Patient had a COVID-19  infection in September 2022.  03/23/2021 CT abdomen pelvis No significant change in the ill-defined pancreatic head mass or  associated biliary ductal and pancreatic ductal dilation.  Slight interval enlargement of subcentimeter anterior peripancreatic  lymph node, nonspecific. Attention on follow-up per clinical protocol. Similar enlargement of the ascending thoracic aorta measuring 4.4 cm  03/23/2021 MRI abdomen w and wo  Increasing ill-defined hypoenhancement of the pancreatic head surrounding the common bile duct stent, in keeping with known pancreatic malignancy. Anterior peripancreatic lymph node is better evaluated on CT.  Unchanged position of a common bile duct stent with similar mild diffuse intrahepatic ductal dilation and  pneumobilia. No evidence of metastatic disease in the abdomen or pelvis. Partially visualized cystic left adnexal lesion measuring up to 3.9 cm.   04/10/2021, patient underwent liver resection at [facility], by Dr. Zani.   Pathology report from [facility] was reviewed. Segments 3 partial hepatectomy, negative for viable tumor. Section 5/6 partial hepatectomy part 1 and 2, microscopic foci of residual viable adenocarcinoma, morphologically consistent with history of pancreatic primary.  Parenchymal margin is uninvolved.  #Patient resumed on FOLFIRI on 04/28/2021. She got another cycle on 05/12/2021 #05/26/2021, patient did not get additional chemotherapy due to transaminitis.  Shared decision was made to stop FOLFIRI and switch to gemcitabine  Abraxane  treatments.  05/26/2021, patient developed transaminitis, AST 763, ALT 691, alkaline phosphatase 364.  Bilirubin 0.7 05/26/2021 stat ultrasound abdomen right upper quadrant showed interval development of 3.3 x 1.8 cm complex mass in the right hepatic lobe.  Increased extrahepatic ductal dilatation.  Concerning for CBD obstruction. 05/29/2021, MRI abdomen MRCP with and without contrast showed unchanged pancreatic head soft tissue.  Severe intra and extrahepatic biliary ductal dilatation.  Common bile duct stents remain in position however patency is not established.  No evidence of lymphadenopathy or metastatic disease in the abdomen.  With further communication with radiologist, addendum was added that there is internal fluid signal and no appreciable associated contrast enhancement measuring 3.4 x 2.3 cm.  This appearance is generally not consistent with metastasis and is of uncertain significance.  Possibly reflecting hepatic abscess or residual of subcapsular hematoma.  Patient was seen by [facility] Dr. Oneal team.  He had ERCP done on 06/05/2021, with findings of One stent from the biliary tree was seen in the major papilla. The stent had migrated significantly into the  duodenum. This is the cause of stent malfunction. One stent was removed from the biliary tree. Prior biliary sphincterotomy appeared open. - A single severe biliary stricture was found in the lower third of the main bile duct with upstream dilation. The stricture was alignant appearing.- The biliary tree was swept and sludge was found.- One fully covered metal stent was placed into the common bile duct across the stricture.- No pancreatogram performed.  06/12/2021-08/10/2021 gemcitabine  and Abraxane .   10/14/2021, patient underwent Whipple procedure. Liver biopsy negative for malignancy. Review procedure showed invasive adenocarcinoma, moderately differentiated, centered in pancreatic head and confined in the Pancreas. Surgical margin is negative for malignancy.  36 lymph nodes were all negative for malignancy.  1 hepatic artery lymph node was harvested and was negative.  Gallbladder negative for malignancy.  Cystic duct excision negative for malignancy. pT1b pN0  10/25/2021, CT abdomen pelvis with contrast showed rim-enhancing fluid collection in the region of the hepatic hilum. Patient had a readmission for superficial wound infection.  She completed antibiotics on 11/03/2021.  11/03/2021, CT abdomen pelvis with contrast showed near resolution of previously seen fluid collection in the region  of the hepatic hilum.  Similar appearance of the area of hypoenhancement in the liver.  Attention on follow-up.  11/17/2021, resumed on gemcitabine /Abraxane . 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  12/12/ 2023 CT scan at [facility] shows no evidence of cancer recurrence.-NED 09/21/2022 CT chest abdomen pelvis w contrast at [facility] showed-No evidence of metastatic disease in the chest, abdomen, or pelvis. 12/21/2022 CT chest abdomen pelvis w contrast at [facility] showed-No evidence of metastatic disease in the chest, abdomen, or pelvis.   03/22/2023 CT scan at [facility] showed enlarging lung nodules. RLL 1cm, LLL 0.5cm, RUL  0.5cm Stable hypoattenuating right inferior hepatic lobe lesion measuring 0.9 cm.  Similar left adnexal cystic lesion measuring up to 5.0 cm.   04/06/2023 s/p lung nodule biopsy.  Rare atypical epithelioid cells present with evidence suggestive of invasion, suspicious but not diagnostic for malignancy.   05/20/2023: Right robot assisted thoracoscopic wedge resection with MLND (Dr. Murlean)   Pathology:  Suspicious for malignancy but not diagnostic A. Lymph node, level 8, biopsy: Metastatic carcinoma involving one lymph node (1/1).  B. Lung, right lower lobe, wedge resection: Lung tissue positive for adenocarcinoma, consistent with metastasis from pancreas primary. See comment. Tumor size: 1.0 cm. Operative margin: Negative  Comment: Note is made of the patient's history of invasive adenocarcinoma of the pancreatic head ([id number]; 10/14/2021), which is compared to the current case. Comparison is challenging given the post-treatment nature of the pancreatic tumor; however, there are some morphologic similarities.  Although often unhelpful in the setting of lung primary versus pancreatic primary, immunohistochemical stains were performed. Tumor cells demonstrate the below immunoprofile which would could support pulmonary adenocarcinoma with enteric differentiation but would also be consistent with metastasis from the patient's known pancreatic primary; the latter is favored.              Positive                             Negative CK7 (strong, diffuse) TTF-1  CK20 (strong, focal)                    Positive and negative controls perform appropriately.    C. Lymph node, level 7, biopsy: Metastatic carcinoma involving one lymph node (1/1).  D. Lymph node, level 4R, biopsy: One lymph node, negative for malignancy (0/1).   INTERVAL HISTORY Cassandra [name] is a 68 y.o. female who has above history reviewed by me today presents for stage IV pancreatic cancer.   She tolerates Gemcitabine  Abraxane  1  week on and 1 week off, overall she tolerates well.  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  She gets acupuncture session which helped her symptoms.  She takes calcium  supplementation as instructed.  Left side neck pain since last treatments. She gets massage from chiropractor with some relief.  She take Creon  for pancreatic insufficiency need refills.    Review of Systems  Constitutional:  Positive for fatigue. Negative for appetite change, chills, fever and unexpected weight change.  HENT:   Negative for hearing loss and voice change.   Eyes:  Negative for eye problems.  Respiratory:  Negative for chest tightness and cough.   Cardiovascular:  Negative for chest pain.  Gastrointestinal:  Negative for abdominal distention, abdominal pain, blood in stool and nausea.  Endocrine: Negative for hot flashes.  Genitourinary:  Negative for difficulty urinating and frequency.  Musculoskeletal:  Negative for arthralgias.  Skin:  Negative for itching and rash.  Neurological:  Positive for numbness. Negative for extremity weakness.  Hematological:  Negative for adenopathy.  Psychiatric/Behavioral:  Negative for confusion.     MEDICAL HISTORY:  Past Medical History:  Diagnosis Date   Allergy    Anemia    Bacteremia due to Streptococcus pneumoniae 03/19/2020   Cancer [facility])    pancreatic cancer   Colon polyps    Family history of breast cancer    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   Neutropenia (HCC) 04/18/2020   Osteopenia after menopause 05/2017   femoral neck T score -2.0   Personal history of chemotherapy    current for pancreatic ca   PONV (postoperative nausea and vomiting)    Pure hypercholesterolemia    Sepsis (HCC) 03/19/2020    SURGICAL HISTORY: Past Surgical History:  Procedure Laterality Date   COLONOSCOPY  07/2015   WNL   COLONOSCOPY  2008/2011   COLONOSCOPY WITH PROPOFOL  N/A 07/10/2021   Procedure:  COLONOSCOPY WITH PROPOFOL ;  Surgeon: Maryruth Ole DASEN, MD;  Location: [facility] ENDOSCOPY;  Service: Endoscopy;  Laterality: N/A;   IR CV LINE INJECTION  04/28/2021   OVARIAN CYST REMOVAL  1992   dermoid-Dr CAK   PORTA CATH INSERTION N/A 03/07/2020   Procedure: PORTA CATH INSERTION;  Surgeon: Marea Selinda RAMAN, MD;  Location: [facility] INVASIVE CV LAB;  Service: Cardiovascular;  Laterality: N/A;   TEE WITHOUT CARDIOVERSION N/A 03/21/2020   Procedure: TRANSESOPHAGEAL ECHOCARDIOGRAM (TEE);  Surgeon: Fernand Denyse LABOR, MD;  Location: [facility] ORS;  Service: Cardiovascular;  Laterality: N/A;   TUBAL LIGATION  1993    SOCIAL HISTORY: Social History   Socioeconomic History   Marital status: Married    Spouse name: Not on file   Number of children: 2   Years of education: Not on file   Highest education level: Bachelor's degree (e.g., BA, AB, BS)  Occupational History   Occupation: Runner, broadcasting/film/video    Comment: retired   Occupation: Visual merchandiser  Tobacco Use   Smoking status: Former    Current packs/day: 0.00    Average packs/day: 1 pack/day for 10.0 years (10.0 ttl pk-yrs)    Types: Cigarettes    Start date: 06/29/1975    Quit date: 06/28/1985    Years since quitting: 38.5   Smokeless tobacco: Never   Tobacco comments:    Quit smoking 1987  Vaping Use   Vaping status: Never Used  Substance and Sexual Activity   Alcohol use: Yes    Alcohol/week: 5.0 standard drinks of alcohol    Types: 5 Glasses of wine per week    Comment: 0-2 mixed drinks a day   Drug use: No   Sexual activity: Not Currently    Birth control/protection: Post-menopausal  Other Topics Concern   Not on file  Social History Narrative   Not on file   Social Drivers of Health   Financial Resource Strain: Low Risk  (10/18/2023)   Overall Financial Resource Strain (CARDIA)    Difficulty of Paying Living Expenses: Not hard at all  Food Insecurity: No Food Insecurity (10/18/2023)   Hunger Vital Sign    Worried About Running Out of Food in the Last  Year: Never true    Ran Out of Food in the Last Year: Never true  Transportation Needs: No Transportation Needs (10/18/2023)   PRAPARE - Transportation    Lack of Transportation (Medical): No    Lack of  Transportation (Non-Medical): No  Physical Activity: Sufficiently Active (10/18/2023)   Exercise Vital Sign    Days of Exercise per Week: 6 days    Minutes of Exercise per Session: 30 min  Stress: No Stress Concern Present (10/18/2023)   [employer] of Occupational Health - Occupational Stress Questionnaire    Feeling of Stress : Not at all  Social Connections: Moderately Integrated (10/18/2023)   Social Connection and Isolation Panel    Frequency of Communication with Friends and Family: More than three times a week    Frequency of Social Gatherings with Friends and Family: More than three times a week    Attends Religious Services: More than 4 times per year    Active Member of [employer] or Organizations: No    Attends Banker Meetings: Never    Marital Status: Married  Catering manager Violence: Not At Risk (10/18/2023)   Humiliation, Afraid, Rape, and Kick questionnaire    Fear of Current or Ex-Partner: No    Emotionally Abused: No    Physically Abused: No    Sexually Abused: No    FAMILY HISTORY: Family History  Problem Relation Age of Onset   Breast cancer Paternal Aunt 82   Diabetes Mother    Osteoporosis Mother    Hyperlipidemia Father    Rheumatic fever Father    Valvular heart disease Father    Cancer Paternal Grandmother        possible colon   Breast cancer Sister 61    ALLERGIES:  is allergic to penicillin g.  MEDICATIONS:  Current Outpatient Medications  Medication Sig Dispense Refill   aspirin  81 MG chewable tablet Chew 81 mg by mouth daily.     b complex vitamins capsule Take 1 capsule by mouth daily.     Calcium  Carbonate-Vit D-Min (CALCIUM  1200 PO) Take by mouth.     Cholecalciferol  25 MCG (1000 UT) tablet Take 2,000 Units by mouth daily.      clobetasol  cream (TEMOVATE ) 0.05 %      fluticasone (FLONASE) 50 MCG/ACT nasal spray Place 1 spray into both nostrils daily.     lidocaine -prilocaine  (EMLA ) cream Apply small amount to port and cover with saran wrap 1-2 hours prior to port access 30 g 6   loratadine (CLARITIN REDITABS) 10 MG dissolvable tablet Take 10 mg by mouth daily.     Melatonin 2.5 MG CHEW Chew by mouth.     Multiple Vitamins-Minerals (MULTIVITAMIN WITH MINERALS) tablet Take 1 tablet by mouth daily.     Pancrelipase , Lip-Prot-Amyl, (CREON ) 24000-76000 units CPEP TAKE 1 CAPSULE THREE TIMES DAILY BEFORE MEALS 200 capsule 3   prochlorperazine  (COMPAZINE ) 10 MG tablet Take 10 mg by mouth every 6 (six) hours as needed for nausea or vomiting.     senna-docusate (SENOKOT-S) 8.6-50 MG tablet Take by mouth.     simethicone  (GAS-X) 80 MG chewable tablet Chew 1 tablet (80 mg total) by mouth every 8 (eight) hours as needed for flatulence. 60 tablet 0   No current facility-administered medications for this visit.   Facility-Administered Medications Ordered in Other Visits  Medication Dose Route Frequency Provider Last Rate Last Admin   0.9 %  sodium chloride  infusion   Intravenous Continuous Babara Call, MD 10 mL/hr at 09/12/23 1241 Infusion Verify at 09/12/23 1241   0.9 %  sodium chloride  infusion   Intravenous Continuous Babara Call, MD 10 mL/hr at 12/19/23 1342 New Bag at 12/19/23 1342   calcium  gluconate 2 g in sodium chloride  0.9 %  100 mL IVPB  2 g Intravenous PRN Babara Call, MD   Stopped at 09/12/23 1101   gemcitabine  (GEMZAR ) 1,710 mg in sodium chloride  0.9 % 250 mL chemo infusion  1,000 mg/m2 (Order-Specific) Intravenous Once Babara Call, MD       heparin  lock flush 100 UNIT/ML injection            PACLitaxel -protein bound (ABRAXANE ) chemo infusion 175 mg  100 mg/m2 (Order-Specific) Intravenous Once Andie Mungin, MD 70 mL/hr at 12/19/23 1457 175 mg at 12/19/23 1457   prochlorperazine  (COMPAZINE ) 10 MG tablet            sodium chloride   flush (NS) 0.9 % injection 10 mL  10 mL Intravenous PRN Babara Call, MD   10 mL at 02/18/21 0858   sodium chloride  flush (NS) 0.9 % injection 10 mL  10 mL Intravenous Once Borders, Fonda SAUNDERS, NP       sodium chloride  flush (NS) 0.9 % injection 10 mL  10 mL Intravenous Once Babara Call, MD         PHYSICAL EXAMINATION: ECOG PERFORMANCE STATUS: 0 - Asymptomatic Vitals:   12/19/23 1302 12/19/23 1309  BP: (!) 153/91 (!) 143/83  Pulse: 60   Resp: 18   Temp: 98.9 F (37.2 C)   SpO2: 100%    Filed Weights   12/19/23 1302  Weight: 137 lb 9.6 oz (62.4 kg)     Physical Exam Constitutional:      General: She is not in acute distress. HENT:     Head: Normocephalic and atraumatic.   Eyes:     General: No scleral icterus.   Cardiovascular:     Rate and Rhythm: Normal rate and regular rhythm.     Heart sounds: Normal heart sounds.  Pulmonary:     Effort: Pulmonary effort is normal. No respiratory distress.  Abdominal:     General: Bowel sounds are normal. There is no distension.     Palpations: Abdomen is soft.   Musculoskeletal:        General: No deformity. Normal range of motion.     Cervical back: Normal range of motion and neck supple.   Skin:    General: Skin is warm.     Coloration: Skin is not jaundiced.     Findings: No erythema.   Neurological:     Mental Status: She is alert and oriented to person, place, and time. Mental status is at baseline.   Psychiatric:        Mood and Affect: Mood normal.     LABORATORY DATA:  I have reviewed the data as listed    Latest Ref Rng & Units 12/19/2023   12:43 PM 12/05/2023    8:50 AM 11/22/2023    1:08 PM  CBC  WBC 4.0 - 10.5 K/uL 6.7  4.5  5.9   Hemoglobin 12.0 - 15.0 g/dL 89.0  89.0  88.3   Hematocrit 36.0 - 46.0 % 32.5  33.5  34.4   Platelets 150 - 400 K/uL 183  151  172       Latest Ref Rng & Units 12/19/2023   12:43 PM 12/05/2023    8:50 AM 11/22/2023    1:08 PM  CMP  Glucose 70 - 99 mg/dL 873  72  870   BUN 8 - 23  mg/dL 15  13  15    Creatinine 0.44 - 1.00 mg/dL 9.32  9.27  9.41   Sodium 135 - 145 mmol/L 138  140  138   Potassium 3.5 - 5.1 mmol/L 3.7  4.1  3.7   Chloride 98 - 111 mmol/L 107  109  108   CO2 22 - 32 mmol/L 23  24  22    Calcium  8.9 - 10.3 mg/dL 8.5  8.5  8.6   Total Protein 6.5 - 8.1 g/dL 6.4  6.6  6.7   Total Bilirubin 0.0 - 1.2 mg/dL 0.7  0.7  0.7   Alkaline Phos 38 - 126 U/L 75  69  76   AST 15 - 41 U/L 30  32  32   ALT 0 - 44 U/L 28  29  29       RADIOGRAPHIC STUDIES: I have personally reviewed the radiological images as listed and agreed with the findings in the report. Reviewed findings of MRI abdomen MRCP done at [facility]. CT CHEST ABDOMEN PELVIS W CONTRAST Result Date: 10/09/2023 CLINICAL DATA:  Pancreatic cancer restaging, ongoing chemotherapy status post Whipple procedure * Tracking Code: BO * EXAM: CT CHEST, ABDOMEN, AND PELVIS WITH CONTRAST TECHNIQUE: Multidetector CT imaging of the chest, abdomen and pelvis was performed following the standard protocol during bolus administration of intravenous contrast. RADIATION DOSE REDUCTION: This exam was performed according to the departmental dose-optimization program which includes automated exposure control, adjustment of the mA and/or kV according to patient size and/or use of iterative reconstruction technique. CONTRAST:  OMNIPAQUE  IOHEXOL  300 MG/ML  SOLN COMPARISON:  06/27/2023 FINDINGS: CT CHEST FINDINGS Cardiovascular: Unchanged enlargement of the tubular ascending thoracic aorta measuring up to 4.5 x 4.4 cm (series 2, image 31). Scattered aortic atherosclerosis normal heart size. No pericardial effusion. Mediastinum/Nodes: No enlarged mediastinal, hilar, or axillary lymph nodes. Thyroid gland, trachea, and esophagus demonstrate no significant findings. Lungs/Pleura: Bandlike scarring of the right lung base, unchanged. No significant change in an irregular nodule of the dependent left upper lobe measuring 0.6 cm (series 4, image 41).  No significant change in adjacent nodules of the dependent left lower lobe, a cavitary nodule measuring 0.9 x 0.7 cm (series 4, image 77) and an adjacent solid nodule measuring 0.5 cm (series 4, image 72). Unchanged small nodule of the anterior inferior right upper lobe measuring 0.5 cm (series 4, image 71). No new nodules. No pleural effusion or pneumothorax. Musculoskeletal: No chest wall abnormality. No acute osseous findings. CT ABDOMEN PELVIS FINDINGS Hepatobiliary: Unchanged, very subtle hypodense lesion of the inferior margin of hepatic segment V adjacent to the gallbladder fossa measuring 0.8 cm (series 2, image 70). Status post cholecystectomy and hepatojejunostomy. No biliary dilatation. Pancreas: Status post Whipple pancreaticoduodenectomy. Diminished size and solid character of soft tissue at the right aspect of the central superior mesenteric vein, on today's examination measuring 1.0 x 0.8 cm, previously 1.3 x 1.1 cm (series 2, image 75). No pancreatic ductal dilatation or surrounding inflammatory changes. Spleen: Normal in size without significant abnormality. Adrenals/Urinary Tract: Adrenal glands are unremarkable. Kidneys are normal, without renal calculi, solid lesion, or hydronephrosis. Bladder is unremarkable. Stomach/Bowel: Status post distal gastrectomy and gastrojejunostomy. Appendix appears normal. No evidence of bowel wall thickening, distention, or inflammatory changes. Vascular/Lymphatic: Aortic atherosclerosis. No enlarged abdominal or pelvic lymph nodes. Reproductive: No mass. Unchanged left ovarian cyst measuring 5.1 x 3.1 cm (series 2, image 102). Other: No abdominal wall hernia or abnormality. No ascites. Musculoskeletal: No acute osseous findings. IMPRESSION: 1. Status post Whipple pancreaticoduodenectomy. Diminished size and solid character of soft tissue at the right aspect of the central superior mesenteric vein, on today's examination measuring 1.0 x  0.8 cm, previously 1.3 x 1.1  cm which remains highly suspicious for local recurrence. 2. Unchanged, very subtle hypodense lesion of the inferior margin of hepatic segment V adjacent to the gallbladder fossa measuring 0.8 cm. Continued attention on follow-up. 3. No significant change in small bilateral pulmonary nodules, highly suspicious for small pulmonary metastases. 4. Unchanged left ovarian cyst measuring 5.1 x 3.1 cm. Although abnormal in the late postmenopausal setting, this is probably benign and attention on follow-up is sufficient in the setting of known malignancy. 5. Unchanged enlargement of the tubular ascending thoracic aorta measuring up to 4.5 x 4.4 cm. Ascending thoracic aortic aneurysm. Attention on follow-up in the setting of known primary malignancy. Aortic Atherosclerosis (ICD10-I70.0). Electronically Signed   By: Marolyn JONETTA Jaksch M.D.   On: 10/09/2023 16:57

## 2023-12-19 NOTE — Assessment & Plan Note (Signed)
 Chemotherapy plan as listed above

## 2023-12-19 NOTE — Assessment & Plan Note (Signed)
 Mostly bothering her toes, grade 2.  She did not tolerate Cymbalta .   Stable symptoms She gets acupuncture sessions which help her symptoms.

## 2024-01-02 ENCOUNTER — Ambulatory Visit
Admission: RE | Admit: 2024-01-02 | Discharge: 2024-01-02 | Disposition: A | Discharge status: Home / Self Care | Source: Ambulatory Visit | Attending: Oncology | Admitting: Oncology

## 2024-01-02 DIAGNOSIS — C259 Malignant neoplasm of pancreas, unspecified: Secondary | ICD-10-CM | POA: Insufficient documentation

## 2024-01-02 MED ORDER — IOHEXOL 300 MG/ML  SOLN
100.0000 mL | Freq: Once | INTRAMUSCULAR | Status: AC | PRN
  Administered 2024-01-02: 100 mL via INTRAVENOUS

## 2024-01-05 ENCOUNTER — Inpatient Hospital Stay: Attending: Oncology

## 2024-01-05 ENCOUNTER — Encounter: Payer: Self-pay | Admitting: Oncology

## 2024-01-05 ENCOUNTER — Inpatient Hospital Stay (HOSPITAL_BASED_OUTPATIENT_CLINIC_OR_DEPARTMENT_OTHER): Admitting: Oncology

## 2024-01-05 ENCOUNTER — Ambulatory Visit

## 2024-01-05 ENCOUNTER — Inpatient Hospital Stay

## 2024-01-05 VITALS — BP 128/78 | HR 53 | Temp 97.9°F | Resp 18 | Wt 137.2 lb

## 2024-01-05 DIAGNOSIS — Z87891 Personal history of nicotine dependence: Secondary | ICD-10-CM | POA: Insufficient documentation

## 2024-01-05 DIAGNOSIS — C787 Secondary malignant neoplasm of liver and intrahepatic bile duct: Secondary | ICD-10-CM | POA: Insufficient documentation

## 2024-01-05 DIAGNOSIS — Z809 Family history of malignant neoplasm, unspecified: Secondary | ICD-10-CM | POA: Insufficient documentation

## 2024-01-05 DIAGNOSIS — C250 Malignant neoplasm of head of pancreas: Secondary | ICD-10-CM | POA: Insufficient documentation

## 2024-01-05 DIAGNOSIS — T451X5A Adverse effect of antineoplastic and immunosuppressive drugs, initial encounter: Secondary | ICD-10-CM | POA: Insufficient documentation

## 2024-01-05 DIAGNOSIS — Z803 Family history of malignant neoplasm of breast: Secondary | ICD-10-CM | POA: Diagnosis not present

## 2024-01-05 DIAGNOSIS — C259 Malignant neoplasm of pancreas, unspecified: Secondary | ICD-10-CM | POA: Diagnosis not present

## 2024-01-05 DIAGNOSIS — G620 Drug-induced polyneuropathy: Secondary | ICD-10-CM

## 2024-01-05 DIAGNOSIS — K8689 Other specified diseases of pancreas: Secondary | ICD-10-CM

## 2024-01-05 DIAGNOSIS — C7800 Secondary malignant neoplasm of unspecified lung: Secondary | ICD-10-CM | POA: Insufficient documentation

## 2024-01-05 LAB — CBC WITH DIFFERENTIAL (CANCER CENTER ONLY)
Abs Immature Granulocytes: 0.01 K/uL (ref 0.00–0.07)
Basophils Absolute: 0 K/uL (ref 0.0–0.1)
Basophils Relative: 1 %
Eosinophils Absolute: 0.1 K/uL (ref 0.0–0.5)
Eosinophils Relative: 2 %
HCT: 33.3 % — ABNORMAL LOW (ref 36.0–46.0)
Hemoglobin: 11.1 g/dL — ABNORMAL LOW (ref 12.0–15.0)
Immature Granulocytes: 0 %
Lymphocytes Relative: 28 %
Lymphs Abs: 1.3 K/uL (ref 0.7–4.0)
MCH: 32.7 pg (ref 26.0–34.0)
MCHC: 33.3 g/dL (ref 30.0–36.0)
MCV: 98.2 fL (ref 80.0–100.0)
Monocytes Absolute: 0.5 K/uL (ref 0.1–1.0)
Monocytes Relative: 11 %
Neutro Abs: 2.7 K/uL (ref 1.7–7.7)
Neutrophils Relative %: 58 %
Platelet Count: 234 K/uL (ref 150–400)
RBC: 3.39 MIL/uL — ABNORMAL LOW (ref 3.87–5.11)
RDW: 14.6 % (ref 11.5–15.5)
WBC Count: 4.6 K/uL (ref 4.0–10.5)
nRBC: 0 % (ref 0.0–0.2)

## 2024-01-05 LAB — CMP (CANCER CENTER ONLY)
ALT: 26 U/L (ref 0–44)
AST: 35 U/L (ref 15–41)
Albumin: 3.7 g/dL (ref 3.5–5.0)
Alkaline Phosphatase: 72 U/L (ref 38–126)
Anion gap: 7 (ref 5–15)
BUN: 15 mg/dL (ref 8–23)
CO2: 23 mmol/L (ref 22–32)
Calcium: 8.6 mg/dL — ABNORMAL LOW (ref 8.9–10.3)
Chloride: 108 mmol/L (ref 98–111)
Creatinine: 0.76 mg/dL (ref 0.44–1.00)
GFR, Estimated: >60 mL/min (ref >60)
Glucose, Bld: 85 mg/dL (ref 70–99)
Potassium: 3.9 mmol/L (ref 3.5–5.1)
Sodium: 138 mmol/L (ref 135–145)
Total Bilirubin: 0.7 mg/dL (ref 0.0–1.2)
Total Protein: 6.3 g/dL — ABNORMAL LOW (ref 6.5–8.1)

## 2024-01-05 MED ORDER — ONDANSETRON 8 MG PO TBDP: 8.0000 mg | ORAL_TABLET | Freq: Three times a day (TID) | ORAL | 2 refills | Status: DC | PRN

## 2024-01-05 MED ORDER — HEPARIN SOD (PORK) LOCK FLUSH 100 UNIT/ML IV SOLN
500.0000 [IU] | Freq: Once | INTRAVENOUS | Status: AC
  Administered 2024-01-05: 500 [IU] via INTRAVENOUS
  Filled 2024-01-05: qty 5

## 2024-01-05 NOTE — Assessment & Plan Note (Signed)
 S/p  Whipple procedure..  Continue Creon, continue [id number] units TID with meals and  with her snacks

## 2024-01-05 NOTE — Progress Notes (Signed)
 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  Pathology proved liver metastatic disease- additional chemotherapy --> whipple procedure.ypT1b ypN0--> adjuvant Gem/Abraxane  finished in August 2023--> Sept 2024 CT showed progression in lung --> Nov 2024 Wedge biopsy- Recurrent pancreatic cancer with lung metastasis.  Currently on Gemcitabine  and Abraxane  1 week on 1 week off schedule due to neutropenia.  Labs are reviewed and discussed with patient.  July CT - stable Signatera circulating tumor DNA testing- negative.  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  She did not tolerate Cymbalta .   Stable symptoms She gets acupuncture sessions which help her symptoms.   Hypocalcemia Likely due to malabsorption. Continue calcium  supplementation, 2000 mg daily-divided in 2-3 doses IV calcium  gluconate 2g if calcium  <=8.5 Normal vitamin D  level   Pancreatic insufficiency S/p  Whipple procedure..  Continue Creon , continue 24000 units TID with meals and  with her snacks    Orders Placed This Encounter  Procedures   Cancer antigen 19-9    Standing Status:   Future    Expected Date:   02/09/2024    Expiration Date:   02/08/2025   CEA    Standing Status:   Future    Expected Date:   02/09/2024    Expiration Date:   02/08/2025   CBC with Differential (Cancer Center  Only)    Standing Status:   Future    Expected Date:   02/09/2024    Expiration Date:   02/08/2025   CMP (Cancer Center only)    Standing Status:   Future    Expected Date:   02/09/2024    Expiration Date:   02/08/2025    Follow up  2 weeks lab MD Gemcitaibine Abraxane   All questions were answered. The patient knows to call the clinic with any problems, questions or concerns.  Call Babara, MD, PhD [facility] Hematology Oncology 01/05/2024   Babara Call, MD    HISTORY OF PRESENTING ILLNESS:   Cassandra [name] is a  68 y.o.  female with presents for follow up of Stage IV pancreatic adenocarcinoma Patient initially presented with jaundice, transaminitis, bilirubin was 9.9.  CA 19-9 was 1874.  Patient also reports unintentional weight loss. 02/08/2020 MRI abdomen and MRCP with and without contrast was done at [facility] which showed pancreatic head mass measuring up to 3 cm, with marked associated narrowing of the portal vein confluence.  SMA is preserved.  Marked intrahepatic and extrahepatic biliary duct dilatation as well as mild dilatation of the main pancreatic duct. Multiple hepatic masses highly concerning for metastatic disease.  Patient underwent EUS on 02/19/2020, which showed irregular mass identified in the pancreatic head, hypoechoic, measured 18mmx33mm, sonographic evidence concerning for invasion into the superior mesenteric artery.  There is no sign of significant abnormality in the main pancreatic duct.  Dilatation of common bile duct which measured up to 16 mm.  Region of celiac artery was visualized and showed no signs of significant abnormality.  No  lymphadenopathy.  FNA showed adenocarcinoma.  02/19/2020, ERCP, malignant.  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  The stricture was treated with placement of wall flex metal stent.  Patient was seen by [facility] oncology Dr. [name] and was recommended for 3 drug regimen FOLFIRINOX.   Patient prefers to do chemotherapy locally at [facility].  Patient was referred to establish care today. She denies any pain.  Since stent placement, skin jaundice has improved.  Itchiness has also improved. Patient was accompanied by her husband today.  She has a family history of breast cancer in sister and paternal aunt, colon cancer paternal grandmother.  #No reportable targetable mutation on NGS 9/14/2021cycle 1 FOLFIRINOX.  Patient received oxaliplatin  and about 50% of Irinotecan  on day 1 and had experienced neurologic symptoms.  She went to ER and working diagnosis is TIA, and eventually I think this is due to irinotecan  side effects -irinotecan -associated dysarthria, lip/tongue numbness.  Adjustment was made for Irinotecan  to be  infused over 180 minutes.  Atropine  0.5 mg once prior to the irinotecan . No recurrent symptoms.   # 03/19/2020-03/21/2020 patient was admitted due to sepsis with strep pneumonia bacteremia.  Patient was treated with IV Rocephin .  TEE was done which showed no vegetation.  No PFO or ASD.  Patient was discharged home and he finished full course of 14 days of IV Rocephin  on 04/02/2020 per ID recommendation.  Repeat blood culture was also negative.  08/25/20 cycle 10 FOLFIRINOX 09/08/20- present  Starting cycle 11, FOLFIRI, Oxaliplatin  discontinued due to neuropathy   #NGS showed no reportable targetable mutation #Genetic testing-[employer] diagnostic testing showed no pathological variants identified. MS stable, TMB 0mut/mb, KRAS G12D, SF3B1 K700E, TP53 V249fs*30  #01/14/2021  CT done at [facility] during the interval, stable disease.  # Her case was presented by Dr.Jia from [facility] tumor board. [name] were 4~5 liver lesions on OSH MRI last year at the time of diagnosis highly suspicious for metastasis. She is not eligible for surgical protocol for metastasis resection given the number of liver metastasis is >3. However given her excellent and durable response to chemo, surgical resection  may be considered pending sustained disease control at 1 year and may require partial hepatectomy first to see if there is viable tumor before proceeding to Whipple resection ]  Patient had a COVID-19 infection in September 2022.  03/23/2021 CT abdomen pelvis No significant change in the ill-defined pancreatic head mass or  associated biliary ductal and pancreatic ductal dilation.  Slight interval enlargement of subcentimeter anterior peripancreatic  lymph node, nonspecific. Attention on follow-up per clinical protocol. Similar enlargement of the ascending thoracic aorta measuring 4.4 cm  03/23/2021 MRI abdomen w and wo  Increasing ill-defined hypoenhancement of the pancreatic head surrounding the common bile duct stent, in keeping with known pancreatic malignancy. Anterior peripancreatic lymph node is better evaluated on CT.  Unchanged position of a common bile duct stent with similar mild diffuse intrahepatic ductal dilation and pneumobilia. No evidence of metastatic disease in the abdomen or pelvis. Partially visualized cystic left adnexal lesion measuring up to 3.9 cm.   04/10/2021, patient underwent liver resection at [facility], by Dr. Zani.   Pathology report from [facility] was reviewed. Segments 3 partial hepatectomy, negative for viable tumor. Section 5/6 partial hepatectomy part 1 and 2, microscopic foci of residual viable adenocarcinoma, morphologically consistent with history of pancreatic primary.  Parenchymal margin is uninvolved.  #Patient resumed on FOLFIRI on 04/28/2021. She got another  cycle on 05/12/2021 #05/26/2021, patient did not get additional chemotherapy due to transaminitis.  Shared decision was made to stop FOLFIRI and switch to gemcitabine  Abraxane  treatments.  05/26/2021, patient developed transaminitis, AST 763, ALT 691, alkaline phosphatase 364.  Bilirubin 0.7 05/26/2021 stat ultrasound abdomen right upper quadrant showed interval development of 3.3 x 1.8 cm complex mass in the  right hepatic lobe.  Increased extrahepatic ductal dilatation.  Concerning for CBD obstruction. 05/29/2021, MRI abdomen MRCP with and without contrast showed unchanged pancreatic head soft tissue.  Severe intra and extrahepatic biliary ductal dilatation.  Common bile duct stents remain in position however patency is not established.  No evidence of lymphadenopathy or metastatic disease in the abdomen.  With further communication with radiologist, addendum was added that there is internal fluid signal and no appreciable associated contrast enhancement measuring 3.4 x 2.3 cm.  This appearance is generally not consistent with metastasis and is of uncertain significance.  Possibly reflecting hepatic abscess or residual of subcapsular hematoma.  Patient was seen by [facility] Dr. Oneal team.  He had ERCP done on 06/05/2021, with findings of One stent from the biliary tree was seen in the major papilla. The stent had migrated significantly into the duodenum. This is the cause of stent malfunction. One stent was removed from the biliary tree. Prior biliary sphincterotomy appeared open. - A single severe biliary stricture was found in the lower third of the main bile duct with upstream dilation. The stricture was alignant appearing.- The biliary tree was swept and sludge was found.- One fully covered metal stent was placed into the common bile duct across the stricture.- No pancreatogram performed.  06/12/2021-08/10/2021 gemcitabine  and Abraxane .   10/14/2021, patient underwent Whipple procedure. Liver biopsy negative for malignancy. Review procedure showed invasive adenocarcinoma, moderately differentiated, centered in pancreatic head and confined in the Pancreas. Surgical margin is negative for malignancy.  36 lymph nodes were all negative for malignancy.  1 hepatic artery lymph node was harvested and was negative.  Gallbladder negative for malignancy.  Cystic duct excision negative for malignancy. pT1b  pN0  10/25/2021, CT abdomen pelvis with contrast showed rim-enhancing fluid collection in the region of the hepatic hilum. Patient had a readmission for superficial wound infection.  She completed antibiotics on 11/03/2021.  11/03/2021, CT abdomen pelvis with contrast showed near resolution of previously seen fluid collection in the region of the hepatic hilum.  Similar appearance of the area of hypoenhancement in the liver.  Attention on follow-up.  11/17/2021, resumed on gemcitabine /Abraxane . 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  12/12/ 2023 CT scan at [facility] shows no evidence of cancer recurrence.-NED 09/21/2022 CT chest abdomen pelvis w contrast at [facility] showed-No evidence of metastatic disease in the chest, abdomen, or pelvis. 12/21/2022 CT chest abdomen pelvis w contrast at [facility] showed-No evidence of metastatic disease in the chest, abdomen, or pelvis.   03/22/2023 CT scan at [facility] showed enlarging lung nodules. RLL 1cm, LLL 0.5cm, RUL 0.5cm Stable hypoattenuating right inferior hepatic lobe lesion measuring 0.9 cm.  Similar left adnexal cystic lesion measuring up to 5.0 cm.   04/06/2023 s/p lung nodule biopsy.  Rare atypical epithelioid cells present with evidence suggestive of invasion, suspicious but not diagnostic for malignancy.   05/20/2023: Right robot assisted thoracoscopic wedge resection with MLND (Dr. Murlean)   Pathology:  Suspicious for malignancy but not diagnostic A. Lymph node, level 8, biopsy: Metastatic carcinoma involving one lymph node (1/1).  B. Lung, right lower lobe, wedge resection: Lung tissue positive  for adenocarcinoma, consistent with metastasis from pancreas primary. See comment. Tumor size: 1.0 cm. Operative margin: Negative  Comment: Note is made of the patient's history of invasive adenocarcinoma of the pancreatic head ([id number]; 10/14/2021), which is compared to the current case. Comparison is challenging given the post-treatment nature of the  pancreatic tumor; however, there are some morphologic similarities.  Although often unhelpful in the setting of lung primary versus pancreatic primary, immunohistochemical stains were performed. Tumor cells demonstrate the below immunoprofile which would could support pulmonary adenocarcinoma with enteric differentiation but would also be consistent with metastasis from the patient's known pancreatic primary; the latter is favored.              Positive                             Negative CK7 (strong, diffuse) TTF-1  CK20 (strong, focal)                    Positive and negative controls perform appropriately.    C. Lymph node, level 7, biopsy: Metastatic carcinoma involving one lymph node (1/1).  D. Lymph node, level 4R, biopsy: One lymph node, negative for malignancy (0/1).   INTERVAL HISTORY NICOSHA STRUVE is a 68 y.o. female who has above history reviewed by me today presents for stage IV pancreatic cancer.   She tolerates Gemcitabine  Abraxane  1 week on and 1 week off, overall she tolerates well.  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  She gets acupuncture session which helped her symptoms.  She takes calcium  supplementation as instructed.  Left side neck pain since last treatments. She gets massage from chiropractor with some relief.  She take Creon  for pancreatic insufficiency need refills.  + more fatigue   Review of Systems  Constitutional:  Positive for fatigue. Negative for appetite change, chills, fever and unexpected weight change.  HENT:   Negative for hearing loss and voice change.   Eyes:  Negative for eye problems.  Respiratory:  Negative for chest tightness and cough.   Cardiovascular:  Negative for chest pain.  Gastrointestinal:  Negative for abdominal distention, abdominal pain, blood in stool and nausea.  Endocrine: Negative for hot flashes.  Genitourinary:  Negative for difficulty urinating and frequency.    Musculoskeletal:  Negative for arthralgias.  Skin:  Negative for itching and rash.  Neurological:  Positive for numbness. Negative for extremity weakness.  Hematological:  Negative for adenopathy.  Psychiatric/Behavioral:  Negative for confusion.     MEDICAL HISTORY:  Past Medical History:  Diagnosis Date   Allergy    Anemia    Bacteremia due to Streptococcus pneumoniae 03/19/2020   Cancer [facility])    pancreatic cancer   Colon polyps    Family history of breast cancer    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   Neutropenia (HCC) 04/18/2020   Osteopenia after menopause 05/2017   femoral neck T score -2.0   Personal history of chemotherapy    current for pancreatic ca   PONV (postoperative nausea and vomiting)    Pure hypercholesterolemia    Sepsis (HCC) 03/19/2020    SURGICAL HISTORY: Past Surgical History:  Procedure Laterality Date   COLONOSCOPY  07/2015   WNL   COLONOSCOPY  2008/2011   COLONOSCOPY WITH PROPOFOL  N/A 07/10/2021   Procedure: COLONOSCOPY WITH PROPOFOL ;  Surgeon: Maryruth Ole DASEN, MD;  Location: [facility] ENDOSCOPY;  Service: Endoscopy;  Laterality: N/A;   IR CV LINE INJECTION  04/28/2021   OVARIAN CYST REMOVAL  1992   dermoid-Dr CAK   PORTA CATH INSERTION N/A 03/07/2020   Procedure: PORTA CATH INSERTION;  Surgeon: Marea Selinda RAMAN, MD;  Location: [facility] INVASIVE CV LAB;  Service: Cardiovascular;  Laterality: N/A;   TEE WITHOUT CARDIOVERSION N/A 03/21/2020   Procedure: TRANSESOPHAGEAL ECHOCARDIOGRAM (TEE);  Surgeon: Fernand Denyse LABOR, MD;  Location: [facility] ORS;  Service: Cardiovascular;  Laterality: N/A;   TUBAL LIGATION  1993    SOCIAL HISTORY: Social History   Socioeconomic History   Marital status: Married    Spouse name: Not on file   Number of children: 2   Years of education: Not on file   Highest education level: Bachelor's degree (e.g., BA, AB, BS)  Occupational History   Occupation: Runner, broadcasting/film/video    Comment: retired   Occupation: Visual merchandiser  Tobacco  Use   Smoking status: Former    Current packs/day: 0.00    Average packs/day: 1 pack/day for 10.0 years (10.0 ttl pk-yrs)    Types: Cigarettes    Start date: 06/29/1975    Quit date: 06/28/1985    Years since quitting: 38.5   Smokeless tobacco: Never   Tobacco comments:    Quit smoking 1987  Vaping Use   Vaping status: Never Used  Substance and Sexual Activity   Alcohol use: Yes    Alcohol/week: 5.0 standard drinks of alcohol    Types: 5 Glasses of wine per week    Comment: 0-2 mixed drinks a day   Drug use: No   Sexual activity: Not Currently    Birth control/protection: Post-menopausal  Other Topics Concern   Not on file  Social History Narrative   Not on file   Social Drivers of Health   Financial Resource Strain: Low Risk  (10/18/2023)   Overall Financial Resource Strain (CARDIA)    Difficulty of Paying Living Expenses: Not hard at all  Food Insecurity: No Food Insecurity (10/18/2023)   Hunger Vital Sign    Worried About Running Out of Food in the Last Year: Never true    Ran Out of Food in the Last Year: Never true  Transportation Needs: No Transportation Needs (10/18/2023)   PRAPARE - Administrator, Civil Service (Medical): No    Lack of Transportation (Non-Medical): No  Physical Activity: Sufficiently Active (10/18/2023)   Exercise Vital Sign    Days of Exercise per Week: 6 days    Minutes of Exercise per Session: 30 min  Stress: No Stress Concern Present (10/18/2023)   Cassandra [name]    Feeling of Stress : Not at all  Social Connections: Moderately Integrated (10/18/2023)   Social Connection and Isolation Panel    Frequency of Communication with Friends and Family: More than three times a week    Frequency of Social Gatherings with Friends and Family: More than three times a week    Attends Religious Services: More than 4 times per year    Active Member of [employer] or Organizations: No    Attends  Banker Meetings: Never    Marital Status: Married  Catering manager Violence: Not At Risk (10/18/2023)   Humiliation, Afraid, Rape, and Kick [name]    Fear of Current or Ex-Partner: No    Emotionally Abused: No    Physically Abused: No    Sexually Abused: No    FAMILY  HISTORY: Family History  Problem Relation Age of Onset   Breast cancer Paternal Aunt 73   Diabetes Mother    Osteoporosis Mother    Hyperlipidemia Father    Rheumatic fever Father    Valvular heart disease Father    Cancer Paternal Grandmother        possible colon   Breast cancer Sister 19    ALLERGIES:  is allergic to penicillin g.  MEDICATIONS:  Current Outpatient Medications  Medication Sig Dispense Refill   aspirin  81 MG chewable tablet Chew 81 mg by mouth daily.     b complex vitamins capsule Take 1 capsule by mouth daily.     Calcium  Carbonate-Vit D-Min (CALCIUM  1200 PO) Take by mouth.     Cholecalciferol  25 MCG (1000 UT) tablet Take 2,000 Units by mouth daily.     clobetasol  cream (TEMOVATE ) 0.05 %      fluticasone (FLONASE) 50 MCG/ACT nasal spray Place 1 spray into both nostrils daily.     lidocaine -prilocaine  (EMLA ) cream Apply small amount to port and cover with saran wrap 1-2 hours prior to port access 30 g 6   loratadine (CLARITIN REDITABS) 10 MG dissolvable tablet Take 10 mg by mouth daily.     Melatonin 2.5 MG CHEW Chew by mouth.     Multiple Vitamins-Minerals (MULTIVITAMIN WITH MINERALS) tablet Take 1 tablet by mouth daily.     ondansetron  (ZOFRAN -ODT) 8 MG disintegrating tablet Take 1 tablet (8 mg total) by mouth every 8 (eight) hours as needed for nausea or vomiting. 60 tablet 2   Pancrelipase , Lip-Prot-Amyl, (CREON ) 24000-76000 units CPEP TAKE 1 CAPSULE THREE TIMES DAILY BEFORE MEALS 200 capsule 3   prochlorperazine  (COMPAZINE ) 10 MG tablet Take 10 mg by mouth every 6 (six) hours as needed for nausea or vomiting.     simethicone  (GAS-X) 80 MG chewable tablet Chew 1 tablet  (80 mg total) by mouth every 8 (eight) hours as needed for flatulence. 60 tablet 0   senna-docusate (SENOKOT-S) 8.6-50 MG tablet Take by mouth. (Patient not taking: Reported on 01/05/2024)     No current facility-administered medications for this visit.   Facility-Administered Medications Ordered in Other Visits  Medication Dose Route Frequency Provider Last Rate Last Admin   0.9 %  sodium chloride  infusion   Intravenous Continuous Babara Call, MD 10 mL/hr at 09/12/23 1241 Infusion Verify at 09/12/23 1241   calcium  gluconate 2 g in sodium chloride  0.9 % 100 mL IVPB  2 g Intravenous PRN Babara Call, MD   Stopped at 09/12/23 1101   heparin  lock flush 100 UNIT/ML injection            prochlorperazine  (COMPAZINE ) 10 MG tablet            sodium chloride  flush (NS) 0.9 % injection 10 mL  10 mL Intravenous PRN Babara Call, MD   10 mL at 02/18/21 0858   sodium chloride  flush (NS) 0.9 % injection 10 mL  10 mL Intravenous Once Borders, Joshua R, NP       sodium chloride  flush (NS) 0.9 % injection 10 mL  10 mL Intravenous Once Babara Call, MD         PHYSICAL EXAMINATION: ECOG PERFORMANCE STATUS: 0 - Asymptomatic Vitals:   01/05/24 0920  BP: 128/78  Pulse: (!) 53  Resp: 18  Temp: 97.9 F (36.6 C)   Filed Weights   01/05/24 0920  Weight: 137 lb 3.2 oz (62.2 kg)     Physical Exam Constitutional:  General: She is not in acute distress. HENT:     Head: Normocephalic and atraumatic.  Eyes:     General: No scleral icterus. Cardiovascular:     Rate and Rhythm: Normal rate and regular rhythm.     Heart sounds: Normal heart sounds.  Pulmonary:     Effort: Pulmonary effort is normal. No respiratory distress.  Abdominal:     General: Bowel sounds are normal. There is no distension.     Palpations: Abdomen is soft.  Musculoskeletal:        General: No deformity. Normal range of motion.     Cervical back: Normal range of motion and neck supple.  Skin:    General: Skin is warm.     Coloration:  Skin is not jaundiced.     Findings: No erythema.  Neurological:     Mental Status: She is alert and oriented to person, place, and time. Mental status is at baseline.  Psychiatric:        Mood and Affect: Mood normal.     LABORATORY DATA:  I have reviewed the data as listed    Latest Ref Rng & Units 01/05/2024    8:48 AM 12/19/2023   12:43 PM 12/05/2023    8:50 AM  CBC  WBC 4.0 - 10.5 K/uL 4.6  6.7  4.5   Hemoglobin 12.0 - 15.0 g/dL 88.8  89.0  89.0   Hematocrit 36.0 - 46.0 % 33.3  32.5  33.5   Platelets 150 - 400 K/uL 234  183  151       Latest Ref Rng & Units 01/05/2024    8:48 AM 12/19/2023   12:43 PM 12/05/2023    8:50 AM  CMP  Glucose 70 - 99 mg/dL 85  873  72   BUN 8 - 23 mg/dL 15  15  13    Creatinine 0.44 - 1.00 mg/dL 9.23  9.32  9.27   Sodium 135 - 145 mmol/L 138  138  140   Potassium 3.5 - 5.1 mmol/L 3.9  3.7  4.1   Chloride 98 - 111 mmol/L 108  107  109   CO2 22 - 32 mmol/L 23  23  24    Calcium  8.9 - 10.3 mg/dL 8.6  8.5  8.5   Total Protein 6.5 - 8.1 g/dL 6.3  6.4  6.6   Total Bilirubin 0.0 - 1.2 mg/dL 0.7  0.7  0.7   Alkaline Phos 38 - 126 U/L 72  75  69   AST 15 - 41 U/L 35  30  32   ALT 0 - 44 U/L 26  28  29       RADIOGRAPHIC STUDIES: I have personally reviewed the radiological images as listed and agreed with the findings in the report. Reviewed findings of MRI abdomen MRCP done at [facility]. CT CHEST ABDOMEN PELVIS W CONTRAST Result Date: 01/02/2024 CLINICAL DATA:  Pancreatic cancer. Assess treatment response. * Tracking Code: BO * EXAM: CT CHEST, ABDOMEN, AND PELVIS WITH CONTRAST TECHNIQUE: Multidetector CT imaging of the chest, abdomen and pelvis was performed following the standard protocol during bolus administration of intravenous contrast. RADIATION DOSE REDUCTION: This exam was performed according to the departmental dose-optimization program which includes automated exposure control, adjustment of the mA and/or kV according to patient size and/or use of  iterative reconstruction technique. CONTRAST:  OMNIPAQUE  IOHEXOL  300 MG/ML  SOLN COMPARISON:  10/03/2023 FINDINGS: CT CHEST FINDINGS Cardiovascular: Ascending thoracic aorta measures 3.7 cm in coronal dimension which  is within normal limits (image 29/series). Mediastinum/Nodes: Port in the anterior chest wall with tip in distal SVC. No axillary or supraclavicular adenopathy. No mediastinal or hilar adenopathy. No pericardial fluid. Esophagus normal. Lungs/Pleura: Several peripheral nodules in the LEFT lower lobe. Example 7 mm nodule image 80/4 compares to 7 mm remeasured. Adjacent small 5 mm nodule is also unchanged on image 75. Band like pleuroparenchymal thickening in the RIGHT lower lobe is unchanged. This is site of partial lung resection. 4 mm nodule in the RIGHT middle lobe unchanged. Other scattered nodules unchanged. No new pulmonary nodules are evident. Musculoskeletal: No aggressive osseous lesion. CT ABDOMEN AND PELVIS FINDINGS Hepatobiliary: No evidence of hepatic metastasis. Tiny hypodensity along the inferior RIGHT hepatic lobe again noted image 73/2. Postcholecystectomy choledocho urostomy without complication. Pancreas: Post Whipple procedure. Small amount of soft tissue previous described adjacent to to the SMA measures 10 mm 10 mm (image 76/series 2). No significant interval change. No peripancreatic adenopathy. Spleen: Normal spleen Adrenals/urinary tract: Adrenal glands and kidneys are normal. The ureters and bladder normal. Stomach/Bowel: Post partial gastrectomy related level procedure. Small bowel normal. Pancreas scratch the appendix normal. The colon and rectosigmoid colon are normal. Vascular/Lymphatic: Abdominal aorta is normal caliber. There is no retroperitoneal or periportal lymphadenopathy. No pelvic lymphadenopathy. Reproductive: Uterus normal. LEFT ovarian cyst again noted measuring 5.3 x 3.3 cm compared to 5.1 x 3.1 cm. Other: No peritoneal or omental metastatic nodularity.  Musculoskeletal: No aggressive osseous lesion. IMPRESSION: CHEST: 1. Stable small pulmonary nodules. 2. No thoracic adenopathy. PELVIS: 1. Stable small volume soft tissue adjacent to the SMA. 2. No evidence of progressive metastatic disease in the abdomen pelvis. 3. Stable LEFT ovarian cyst. 4. Ascending thoracic aorta is within normal limits in diameter. Electronically Signed   By: Jackquline Boxer M.D.   On: 01/02/2024 10:01

## 2024-01-05 NOTE — Progress Notes (Signed)
 Pt here for follow up. Requesting refill on zofran  (disintegrating tabs)

## 2024-01-05 NOTE — Assessment & Plan Note (Signed)
 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 Continue calcium supplementation, 2000 mg daily-divided in 2-3 doses IV calcium gluconate 2g if calcium <=8.5 Normal vitamin D level

## 2024-01-05 NOTE — Assessment & Plan Note (Addendum)
 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  Pathology proved liver metastatic disease- additional chemotherapy --> whipple procedure.ypT1b ypN0--> adjuvant Gem/Abraxane  finished in August 2023--> Sept 2024 CT showed progression in lung --> Nov 2024 Wedge biopsy- Recurrent pancreatic cancer with lung metastasis.  Currently on Gemcitabine  and Abraxane  1 week on 1 week off schedule due to neutropenia.  Labs are reviewed and discussed with patient.  July CT - stable Signatera circulating tumor DNA testing- negative.  Share decision was made to get a chemotherapy break due to progressive fatigue

## 2024-01-05 NOTE — Assessment & Plan Note (Signed)
 Mostly bothering her toes, grade 2.  She did not tolerate Cymbalta .   Stable symptoms She gets acupuncture sessions which help her symptoms.

## 2024-01-06 LAB — CEA: CEA: 5.3 ng/mL — ABNORMAL HIGH (ref 0.0–4.7)

## 2024-01-06 LAB — CANCER ANTIGEN 19-9: CA 19-9: 6 U/mL (ref 0–35)

## 2024-01-19 ENCOUNTER — Ambulatory Visit
Admission: RE | Admit: 2024-01-19 | Discharge: 2024-01-19 | Disposition: A | Discharge status: Home / Self Care | Source: Ambulatory Visit | Attending: Family Medicine | Admitting: Family Medicine

## 2024-01-19 DIAGNOSIS — Z1231 Encounter for screening mammogram for malignant neoplasm of breast: Secondary | ICD-10-CM | POA: Diagnosis present

## 2024-01-23 ENCOUNTER — Ambulatory Visit

## 2024-01-23 ENCOUNTER — Other Ambulatory Visit

## 2024-01-23 ENCOUNTER — Ambulatory Visit: Admitting: Oncology

## 2024-01-24 ENCOUNTER — Ambulatory Visit: Payer: Self-pay | Admitting: Family Medicine

## 2024-02-02 ENCOUNTER — Encounter: Payer: Self-pay | Admitting: Oncology

## 2024-02-09 ENCOUNTER — Inpatient Hospital Stay: Attending: Oncology

## 2024-02-09 ENCOUNTER — Inpatient Hospital Stay

## 2024-02-09 ENCOUNTER — Encounter: Payer: Self-pay | Admitting: Oncology

## 2024-02-09 ENCOUNTER — Inpatient Hospital Stay (HOSPITAL_BASED_OUTPATIENT_CLINIC_OR_DEPARTMENT_OTHER): Attending: Oncology | Admitting: Oncology

## 2024-02-09 VITALS — BP 142/80 | HR 50 | Temp 97.8°F | Resp 17

## 2024-02-09 VITALS — BP 123/77 | HR 60 | Temp 97.9°F | Resp 16 | Wt 136.8 lb

## 2024-02-09 DIAGNOSIS — K8689 Other specified diseases of pancreas: Secondary | ICD-10-CM

## 2024-02-09 DIAGNOSIS — G620 Drug-induced polyneuropathy: Secondary | ICD-10-CM | POA: Diagnosis not present

## 2024-02-09 DIAGNOSIS — Z5111 Encounter for antineoplastic chemotherapy: Secondary | ICD-10-CM

## 2024-02-09 DIAGNOSIS — C787 Secondary malignant neoplasm of liver and intrahepatic bile duct: Secondary | ICD-10-CM | POA: Insufficient documentation

## 2024-02-09 DIAGNOSIS — C250 Malignant neoplasm of head of pancreas: Secondary | ICD-10-CM | POA: Insufficient documentation

## 2024-02-09 DIAGNOSIS — C259 Malignant neoplasm of pancreas, unspecified: Secondary | ICD-10-CM | POA: Diagnosis not present

## 2024-02-09 DIAGNOSIS — C7800 Secondary malignant neoplasm of unspecified lung: Secondary | ICD-10-CM | POA: Insufficient documentation

## 2024-02-09 DIAGNOSIS — T451X5A Adverse effect of antineoplastic and immunosuppressive drugs, initial encounter: Secondary | ICD-10-CM

## 2024-02-09 LAB — CBC WITH DIFFERENTIAL (CANCER CENTER ONLY)
Abs Immature Granulocytes: 0.02 K/uL (ref 0.00–0.07)
Basophils Absolute: 0 K/uL (ref 0.0–0.1)
Basophils Relative: 1 %
Eosinophils Absolute: 0.2 K/uL (ref 0.0–0.5)
Eosinophils Relative: 3 %
HCT: 37.6 % (ref 36.0–46.0)
Hemoglobin: 12.5 g/dL (ref 12.0–15.0)
Immature Granulocytes: 0 %
Lymphocytes Relative: 25 %
Lymphs Abs: 1.4 K/uL (ref 0.7–4.0)
MCH: 32.2 pg (ref 26.0–34.0)
MCHC: 33.2 g/dL (ref 30.0–36.0)
MCV: 96.9 fL (ref 80.0–100.0)
Monocytes Absolute: 0.6 K/uL (ref 0.1–1.0)
Monocytes Relative: 11 %
Neutro Abs: 3.5 K/uL (ref 1.7–7.7)
Neutrophils Relative %: 60 %
Platelet Count: 179 K/uL (ref 150–400)
RBC: 3.88 MIL/uL (ref 3.87–5.11)
RDW: 13.1 % (ref 11.5–15.5)
WBC Count: 5.8 K/uL (ref 4.0–10.5)
nRBC: 0 % (ref 0.0–0.2)

## 2024-02-09 LAB — CMP (CANCER CENTER ONLY)
ALT: 23 U/L (ref 0–44)
AST: 31 U/L (ref 15–41)
Albumin: 4 g/dL (ref 3.5–5.0)
Alkaline Phosphatase: 75 U/L (ref 38–126)
Anion gap: 7 (ref 5–15)
BUN: 15 mg/dL (ref 8–23)
CO2: 22 mmol/L (ref 22–32)
Calcium: 8.9 mg/dL (ref 8.9–10.3)
Chloride: 108 mmol/L (ref 98–111)
Creatinine: 0.68 mg/dL (ref 0.44–1.00)
GFR, Estimated: >60 mL/min (ref >60)
Glucose, Bld: 109 mg/dL — ABNORMAL HIGH (ref 70–99)
Potassium: 3.8 mmol/L (ref 3.5–5.1)
Sodium: 137 mmol/L (ref 135–145)
Total Bilirubin: 0.5 mg/dL (ref 0.0–1.2)
Total Protein: 6.5 g/dL (ref 6.5–8.1)

## 2024-02-09 MED ORDER — SODIUM CHLORIDE 0.9 % IV SOLN
1000.0000 mg/m2 | Freq: Once | INTRAVENOUS | Status: AC
  Administered 2024-02-09: 1710 mg via INTRAVENOUS
  Filled 2024-02-09: qty 44.97

## 2024-02-09 MED ORDER — PACLITAXEL PROTEIN-BOUND CHEMO INJECTION 100 MG
100.0000 mg/m2 | Freq: Once | INTRAVENOUS | Status: AC
  Administered 2024-02-09: 175 mg via INTRAVENOUS
  Filled 2024-02-09: qty 35

## 2024-02-09 MED ORDER — SODIUM CHLORIDE 0.9 % IV SOLN
INTRAVENOUS | Status: DC
  Filled 2024-02-09: qty 250

## 2024-02-09 NOTE — Assessment & Plan Note (Signed)
 Mostly bothering her toes, grade 2.  She did not tolerate Cymbalta .   Stable symptoms She gets acupuncture sessions which help her symptoms.

## 2024-02-09 NOTE — Assessment & Plan Note (Signed)
 Chemotherapy plan as listed above

## 2024-02-09 NOTE — Assessment & Plan Note (Signed)
 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 Continue calcium supplementation, 2000 mg daily-divided in 2-3 doses IV calcium gluconate 2g if calcium <=8.5 Normal vitamin D level

## 2024-02-09 NOTE — Patient Instructions (Signed)
 [facility]  Discharge Instructions: Thank you for choosing [facility] to provide your oncology and hematology care.  If you have a lab appointment with the Cancer Center, please go directly to the Cancer Center and check in at the registration area.  Wear comfortable clothing and clothing appropriate for easy access to any Portacath or PICC line.   We strive to give you quality time with your provider. You may need to reschedule your appointment if you arrive late (15 or more minutes).  Arriving late affects you and other patients whose appointments are after yours.  Also, if you miss three or more appointments without notifying the office, you may be dismissed from the clinic at the provider's discretion.      For prescription refill requests, have your pharmacy contact our office and allow 72 hours for refills to be completed.    Today you received the following chemotherapy and/or immunotherapy agents Gemzar & Abraxane      To help prevent nausea and vomiting after your treatment, we encourage you to take your nausea medication as directed.  BELOW ARE SYMPTOMS THAT SHOULD BE REPORTED IMMEDIATELY: *FEVER GREATER THAN 100.4 F (38 C) OR HIGHER *CHILLS OR SWEATING *NAUSEA AND VOMITING THAT IS NOT CONTROLLED WITH YOUR NAUSEA MEDICATION *UNUSUAL SHORTNESS OF BREATH *UNUSUAL BRUISING OR BLEEDING *URINARY PROBLEMS (pain or burning when urinating, or frequent urination) *BOWEL PROBLEMS (unusual diarrhea, constipation, pain near the anus) TENDERNESS IN MOUTH AND THROAT WITH OR WITHOUT PRESENCE OF ULCERS (sore throat, sores in mouth, or a toothache) UNUSUAL RASH, SWELLING OR PAIN  UNUSUAL VAGINAL DISCHARGE OR ITCHING   Items with * indicate a potential emergency and should be followed up as soon as possible or go to the Emergency Department if any problems should occur.  Please show the CHEMOTHERAPY ALERT CARD or  IMMUNOTHERAPY ALERT CARD at check-in to the Emergency Department and triage nurse.  Should you have questions after your visit or need to cancel or reschedule your appointment, please contact [facility] - A DEPT OF [facility]  [phone] and follow the prompts.  Office hours are 8:00 a.m. to 4:30 p.m. Monday - Friday. Please note that voicemails left after 4:00 p.m. may not be returned until the following business day.  We are closed weekends and major holidays. You have access to a nurse at all times for urgent questions. Please call the main number to the clinic [phone] and follow the prompts.  For any non-urgent questions, you may also contact your provider using MyChart. We now offer e-Visits for anyone 33 and older to request care online for non-urgent symptoms. For details visit [email or web address].   Also download the MyChart app! Go to the app store, search "MyChart", open the app, select [place], and log in with your MyChart username and password.

## 2024-02-09 NOTE — Assessment & Plan Note (Signed)
 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  Pathology proved liver metastatic disease- additional chemotherapy --> whipple procedure.ypT1b ypN0--> adjuvant Gem/Abraxane  finished in August 2023--> Sept 2024 CT showed progression in lung --> Nov 2024 Wedge biopsy- Recurrent pancreatic cancer with lung metastasis.  Currently on Gemcitabine  and Abraxane  1 week on 1 week off schedule due to neutropenia.  Labs are reviewed and discussed with patient.  July CT - stable--> chemo break Signatera circulating tumor DNA testing- negative.  Resume chemotherapy with Gemcitabine  and Abraxane  every other week

## 2024-02-09 NOTE — Progress Notes (Signed)
 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  Pathology proved liver metastatic disease- additional chemotherapy --> whipple procedure.ypT1b ypN0--> adjuvant Gem/Abraxane  finished in August 2023--> Sept 2024 CT showed progression in lung --> Nov 2024 Wedge biopsy- Recurrent pancreatic cancer with lung metastasis.  Currently on Gemcitabine  and Abraxane  1 week on 1 week off schedule due to neutropenia.  Labs are reviewed and discussed with patient.  July CT - stable--> chemo break Signatera circulating tumor DNA testing- negative.  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  She did not tolerate Cymbalta .   Stable symptoms She gets acupuncture sessions which help her symptoms.   Encounter for antineoplastic chemotherapy Chemotherapy plan as listed above  Hypocalcemia Likely due to malabsorption. Continue calcium  supplementation, 2000 mg daily-divided in 2-3 doses IV calcium  gluconate 2g if calcium  <=8.5 Normal vitamin D  level   Pancreatic insufficiency S/p  Whipple procedure..  Continue Creon , continue 24000 units TID with meals and  with her snacks    Orders Placed This Encounter  Procedures   Genetic Screening Order    Standing Status:   Future    Expected Date:   02/23/2024    Expiration Date:   02/08/2025   Cancer antigen 19-9    Standing Status:   Future     Expected Date:   03/13/2024    Expiration Date:   03/13/2025   CEA    Standing Status:   Future    Expected Date:   03/13/2024    Expiration Date:   03/13/2025   CBC with Differential (Cancer Center Only)    Standing Status:   Future    Expected Date:   03/13/2024    Expiration Date:   03/13/2025   CMP (Cancer Center only)    Standing Status:   Future    Expected Date:   03/13/2024    Expiration Date:   03/13/2025   CBC with Differential (Cancer Center Only)    Standing Status:   Future    Expected Date:   03/27/2024    Expiration Date:   03/27/2025   CMP (Cancer Center only)    Standing Status:   Future    Expected Date:   03/27/2024    Expiration Date:   03/27/2025    Follow up  2 weeks lab MD Gemcitaibine Abraxane   All questions were answered. The patient knows to call the clinic with any problems, questions or concerns.  Call Babara, MD, PhD [facility] Hematology Oncology 02/09/2024   Babara Call, MD    HISTORY OF PRESENTING ILLNESS:   Cassandra [name] is a  68 y.o.  female with presents for follow up of Stage IV pancreatic adenocarcinoma Patient initially presented with jaundice, transaminitis, bilirubin was 9.9.  CA 19-9 was 1874.  Patient also reports unintentional weight loss. 02/08/2020 MRI abdomen and MRCP with and without contrast was done at [facility] which showed pancreatic head mass measuring up to 3 cm, with marked associated narrowing of the portal vein confluence.  SMA is preserved.  Marked intrahepatic and extrahepatic biliary duct dilatation as well as mild dilatation of the main pancreatic duct. Multiple hepatic masses highly concerning for metastatic disease.  Patient underwent EUS on 02/19/2020, which showed irregular mass identified in the pancreatic head, hypoechoic, measured 58mmx33mm, sonographic evidence concerning for invasion into the superior mesenteric artery.  There is no sign of significant abnormality in the main pancreatic duct.  Dilatation of common bile duct which  measured up to 16 mm.  Region of celiac artery was visualized and showed no signs of significant abnormality.  No lymphadenopathy.  FNA showed adenocarcinoma.  02/19/2020, ERCP, malignant.  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  The stricture was treated with placement of wall flex metal stent.  Patient was seen by [facility] oncology Dr. [name] and was recommended for 3 drug regimen FOLFIRINOX.  Patient prefers to do chemotherapy locally at [facility].  Patient was referred to establish care today. She denies any pain.  Since stent placement, skin jaundice has improved.  Itchiness has also improved. Patient was accompanied by her husband today.  She has a family history of breast cancer in sister and paternal aunt, colon cancer paternal grandmother.  #No reportable targetable mutation on NGS 9/14/2021cycle 1 FOLFIRINOX.  Patient received oxaliplatin  and about 50% of Irinotecan  on day 1 and had experienced neurologic symptoms.  She went to ER and working diagnosis is TIA, and eventually I think this is due to irinotecan  side effects -irinotecan -associated dysarthria, lip/tongue numbness.  Adjustment was made for Irinotecan  to be  infused over 180 minutes.  Atropine  0.5 mg once prior to the irinotecan . No recurrent symptoms.   # 03/19/2020-03/21/2020 patient was admitted due to sepsis with strep pneumonia bacteremia.  Patient was treated with IV Rocephin .  TEE was done which showed no vegetation.  No PFO or ASD.  Patient was discharged home and he finished full course of 14 days of IV Rocephin  on 04/02/2020 per ID recommendation.  Repeat blood culture was also negative.  08/25/20 cycle 10 FOLFIRINOX 09/08/20- present  Starting cycle 11, FOLFIRI, Oxaliplatin  discontinued due to neuropathy   #NGS showed no reportable targetable mutation #Genetic testing-[employer] diagnostic testing showed no pathological variants identified. MS stable, TMB 0mut/mb, KRAS  G12D, SF3B1 K700E, TP53 V212fs*30  #01/14/2021  CT done at [facility] during the interval, stable disease.  # Her case was presented by Dr.Jia from [facility] tumor board. [name] were 4~5 liver lesions on OSH MRI last year at the time of diagnosis highly suspicious for metastasis. She is not eligible for surgical protocol for metastasis resection given the number of liver metastasis is >3. However given her excellent and durable response to chemo, surgical resection may be considered pending sustained disease control at 1 year and may require partial hepatectomy first to see if there is viable tumor before proceeding to Whipple resection ]  Patient had a COVID-19 infection in September 2022.  03/23/2021 CT abdomen pelvis No significant change in the ill-defined pancreatic head mass or  associated biliary ductal and pancreatic ductal dilation.  Slight interval enlargement of subcentimeter anterior peripancreatic  lymph node, nonspecific. Attention on follow-up per clinical protocol. Similar enlargement of the ascending thoracic aorta measuring 4.4 cm  03/23/2021 MRI abdomen w and wo  Increasing ill-defined hypoenhancement of the pancreatic head surrounding the common bile duct stent, in keeping with known pancreatic malignancy. Anterior peripancreatic lymph node is better evaluated on CT.  Unchanged position of a  common bile duct stent with similar mild diffuse intrahepatic ductal dilation and pneumobilia. No evidence of metastatic disease in the abdomen or pelvis. Partially visualized cystic left adnexal lesion measuring up to 3.9 cm.   04/10/2021, patient underwent liver resection at [facility], by Dr. Zani.   Pathology report from [facility] was reviewed. Segments 3 partial hepatectomy, negative for viable tumor. Section 5/6 partial hepatectomy part 1 and 2, microscopic foci of residual viable adenocarcinoma, morphologically consistent with history of pancreatic primary.  Parenchymal margin is uninvolved.  #Patient  resumed on FOLFIRI on 04/28/2021. She got another cycle on 05/12/2021 #05/26/2021, patient did not get additional chemotherapy due to transaminitis.  Shared decision was made to stop FOLFIRI and switch to gemcitabine  Abraxane  treatments.  05/26/2021, patient developed transaminitis, AST 763, ALT 691, alkaline phosphatase 364.  Bilirubin 0.7 05/26/2021 stat ultrasound abdomen right upper quadrant showed interval development of 3.3 x 1.8 cm complex mass in the right hepatic lobe.  Increased extrahepatic ductal dilatation.  Concerning for CBD obstruction. 05/29/2021, MRI abdomen MRCP with and without contrast showed unchanged pancreatic head soft tissue.  Severe intra and extrahepatic biliary ductal dilatation.  Common bile duct stents remain in position however patency is not established.  No evidence of lymphadenopathy or metastatic disease in the abdomen.  With further communication with radiologist, addendum was added that there is internal fluid signal and no appreciable associated contrast enhancement measuring 3.4 x 2.3 cm.  This appearance is generally not consistent with metastasis and is of uncertain significance.  Possibly reflecting hepatic abscess or residual of subcapsular hematoma.  Patient was seen by [facility] Dr. Oneal team.  He had ERCP done on 06/05/2021, with findings of One stent from the biliary tree was seen in the major papilla. The stent had migrated significantly into the duodenum. This is the cause of stent malfunction. One stent was removed from the biliary tree. Prior biliary sphincterotomy appeared open. - A single severe biliary stricture was found in the lower third of the main bile duct with upstream dilation. The stricture was alignant appearing.- The biliary tree was swept and sludge was found.- One fully covered metal stent was placed into the common bile duct across the stricture.- No pancreatogram performed.  06/12/2021-08/10/2021 gemcitabine  and Abraxane .   10/14/2021, patient  underwent Whipple procedure. Liver biopsy negative for malignancy. Review procedure showed invasive adenocarcinoma, moderately differentiated, centered in pancreatic head and confined in the Pancreas. Surgical margin is negative for malignancy.  36 lymph nodes were all negative for malignancy.  1 hepatic artery lymph node was harvested and was negative.  Gallbladder negative for malignancy.  Cystic duct excision negative for malignancy. pT1b pN0  10/25/2021, CT abdomen pelvis with contrast showed rim-enhancing fluid collection in the region of the hepatic hilum. Patient had a readmission for superficial wound infection.  She completed antibiotics on 11/03/2021.  11/03/2021, CT abdomen pelvis with contrast showed near resolution of previously seen fluid collection in the region of the hepatic hilum.  Similar appearance of the area of hypoenhancement in the liver.  Attention on follow-up.  11/17/2021, resumed on gemcitabine /Abraxane . 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  12/12/ 2023 CT scan at [facility] shows no evidence of cancer recurrence.-NED 09/21/2022 CT chest abdomen pelvis w contrast at [name] showed-No evidence of metastatic disease in the chest, abdomen, or pelvis. 12/21/2022 CT chest abdomen pelvis w contrast at [name] showed-No evidence of metastatic disease in the chest, abdomen, or pelvis.   03/22/2023 CT scan at [name] showed enlarging lung nodules.  RLL 1cm, LLL 0.5cm, RUL 0.5cm Stable hypoattenuating right inferior hepatic lobe lesion measuring 0.9 cm.  Similar left adnexal cystic lesion measuring up to 5.0 cm.   04/06/2023 s/p lung nodule biopsy.  Rare atypical epithelioid cells present with evidence suggestive of invasion, suspicious but not diagnostic for malignancy.   05/20/2023: Right robot assisted thoracoscopic wedge resection with MLND (Dr. Murlean)   Pathology:  Suspicious for malignancy but not diagnostic A. Lymph node, level 8, biopsy: Metastatic carcinoma involving one  lymph node (1/1).  B. Lung, right lower lobe, wedge resection: Lung tissue positive for adenocarcinoma, consistent with metastasis from pancreas primary. See comment. Tumor size: 1.0 cm. Operative margin: Negative  Comment: Note is made of the patient's history of invasive adenocarcinoma of the pancreatic head ([id number]; 10/14/2021), which is compared to the current case. Comparison is challenging given the post-treatment nature of the pancreatic tumor; however, there are some morphologic similarities.  Although often unhelpful in the setting of lung primary versus pancreatic primary, immunohistochemical stains were performed. Tumor cells demonstrate the below immunoprofile which would could support pulmonary adenocarcinoma with enteric differentiation but would also be consistent with metastasis from the patient's known pancreatic primary; the latter is favored.              Positive                             Negative CK7 (strong, diffuse) TTF-1  CK20 (strong, focal)                    Positive and negative controls perform appropriately.    C. Lymph node, level 7, biopsy: Metastatic carcinoma involving one lymph node (1/1).  D. Lymph node, level 4R, biopsy: One lymph node, negative for malignancy (0/1).   INTERVAL HISTORY Cassandra [name] is a 68 y.o. female who has above history reviewed by me today presents for stage IV pancreatic cancer.   She tolerates Gemcitabine  Abraxane  1 week on and 1 week off, overall she tolerates well.  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  She gets acupuncture session which helped her symptoms.  She takes calcium  supplementation as instructed.  She take Creon  for pancreatic insufficiency need refills.  + fatigue is better after chemo break.  + mild stuffy nose, no fever, chills, sore throat.    Review of Systems  Constitutional:  Positive for fatigue. Negative for appetite change, chills, fever and  unexpected weight change.  HENT:   Negative for hearing loss and voice change.   Eyes:  Negative for eye problems.  Respiratory:  Negative for chest tightness and cough.   Cardiovascular:  Negative for chest pain.  Gastrointestinal:  Negative for abdominal distention, abdominal pain, blood in stool and nausea.  Endocrine: Negative for hot flashes.  Genitourinary:  Negative for difficulty urinating and frequency.   Musculoskeletal:  Negative for arthralgias.  Skin:  Negative for itching and rash.  Neurological:  Positive for numbness. Negative for extremity weakness.  Hematological:  Negative for adenopathy.  Psychiatric/Behavioral:  Negative for confusion.     MEDICAL HISTORY:  Past Medical History:  Diagnosis Date   Allergy    Anemia    Bacteremia due to Streptococcus pneumoniae 03/19/2020   Cancer [facility])    pancreatic cancer   Colon polyps    Family history of breast cancer    Neuropathy due to drug (HCC)  chemo induced   Neutropenia (HCC) 04/18/2020   Osteopenia after menopause 05/2017   femoral neck T score -2.0   Personal history of chemotherapy    current for pancreatic ca   PONV (postoperative nausea and vomiting)    Pure hypercholesterolemia    Sepsis (HCC) 03/19/2020    SURGICAL HISTORY: Past Surgical History:  Procedure Laterality Date   COLONOSCOPY  07/2015   WNL   COLONOSCOPY  2008/2011   COLONOSCOPY WITH PROPOFOL  N/A 07/10/2021   Procedure: COLONOSCOPY WITH PROPOFOL ;  Surgeon: Maryruth Ole DASEN, MD;  Location: [facility] ENDOSCOPY;  Service: Endoscopy;  Laterality: N/A;   IR CV LINE INJECTION  04/28/2021   OVARIAN CYST REMOVAL  1992   dermoid-Dr CAK   PORTA CATH INSERTION N/A 03/07/2020   Procedure: PORTA CATH INSERTION;  Surgeon: Marea Selinda RAMAN, MD;  Location: [facility] INVASIVE CV LAB;  Service: Cardiovascular;  Laterality: N/A;   TEE WITHOUT CARDIOVERSION N/A 03/21/2020   Procedure: TRANSESOPHAGEAL ECHOCARDIOGRAM (TEE);  Surgeon: Fernand Denyse LABOR, MD;   Location: [facility] ORS;  Service: Cardiovascular;  Laterality: N/A;   TUBAL LIGATION  1993    SOCIAL HISTORY: Social History   Socioeconomic History   Marital status: Married    Spouse name: Not on file   Number of children: 2   Years of education: Not on file   Highest education level: Bachelor's degree (e.g., BA, AB, BS)  Occupational History   Occupation: Runner, broadcasting/film/video    Comment: retired   Occupation: Visual merchandiser  Tobacco Use   Smoking status: Former    Current packs/day: 0.00    Average packs/day: 1 pack/day for 10.0 years (10.0 ttl pk-yrs)    Types: Cigarettes    Start date: 06/29/1975    Quit date: 06/28/1985    Years since quitting: 38.6   Smokeless tobacco: Never   Tobacco comments:    Quit smoking 1987  Vaping Use   Vaping status: Never Used  Substance and Sexual Activity   Alcohol use: Yes    Alcohol/week: 5.0 standard drinks of alcohol    Types: 5 Glasses of wine per week    Comment: 0-2 mixed drinks a day   Drug use: No   Sexual activity: Not Currently    Birth control/protection: Post-menopausal  Other Topics Concern   Not on file  Social History Narrative   Not on file   Social Drivers of Health   Financial Resource Strain: Low Risk  (10/18/2023)   Overall Financial Resource Strain (CARDIA)    Difficulty of Paying Living Expenses: Not hard at all  Food Insecurity: No Food Insecurity (10/18/2023)   Hunger Vital Sign    Worried About Running Out of Food in the Last Year: Never true    Ran Out of Food in the Last Year: Never true  Transportation Needs: No Transportation Needs (10/18/2023)   PRAPARE - Administrator, Civil Service (Medical): No    Lack of Transportation (Non-Medical): No  Physical Activity: Sufficiently Active (10/18/2023)   Exercise Vital Sign    Days of Exercise per Week: 6 days    Minutes of Exercise per Session: 30 min  Stress: No Stress Concern Present (10/18/2023)   [employer] of Occupational Health - Occupational Stress  Questionnaire    Feeling of Stress : Not at all  Social Connections: Moderately Integrated (10/18/2023)   Social Connection and Isolation Panel    Frequency of Communication with Friends and Family: More than three times a week    Frequency of  Social Gatherings with Friends and Family: More than three times a week    Attends Religious Services: More than 4 times per year    Active Member of [employer] or Organizations: No    Attends Banker Meetings: Never    Marital Status: Married  Catering manager Violence: Not At Risk (10/18/2023)   Humiliation, Afraid, Rape, and Kick questionnaire    Fear of Current or Ex-Partner: No    Emotionally Abused: No    Physically Abused: No    Sexually Abused: No    FAMILY HISTORY: Family History  Problem Relation Age of Onset   Breast cancer Paternal Aunt 61   Diabetes Mother    Osteoporosis Mother    Hyperlipidemia Father    Rheumatic fever Father    Valvular heart disease Father    Cancer Paternal Grandmother        possible colon   Breast cancer Sister 64    ALLERGIES:  is allergic to penicillin g.  MEDICATIONS:  Current Outpatient Medications  Medication Sig Dispense Refill   aspirin  81 MG chewable tablet Chew 81 mg by mouth daily.     b complex vitamins capsule Take 1 capsule by mouth daily.     Calcium  Carbonate-Vit D-Min (CALCIUM  1200 PO) Take by mouth.     Cholecalciferol  25 MCG (1000 UT) tablet Take 2,000 Units by mouth daily.     clobetasol  cream (TEMOVATE ) 0.05 %      fluticasone (FLONASE) 50 MCG/ACT nasal spray Place 1 spray into both nostrils daily.     lidocaine -prilocaine  (EMLA ) cream Apply small amount to port and cover with saran wrap 1-2 hours prior to port access 30 g 6   loratadine (CLARITIN REDITABS) 10 MG dissolvable tablet Take 10 mg by mouth daily.     Melatonin 2.5 MG CHEW Chew by mouth.     Multiple Vitamins-Minerals (MULTIVITAMIN WITH MINERALS) tablet Take 1 tablet by mouth daily.     ondansetron   (ZOFRAN -ODT) 8 MG disintegrating tablet Take 1 tablet (8 mg total) by mouth every 8 (eight) hours as needed for nausea or vomiting. 60 tablet 2   Pancrelipase , Lip-Prot-Amyl, (CREON ) 24000-76000 units CPEP TAKE 1 CAPSULE THREE TIMES DAILY BEFORE MEALS 200 capsule 3   prochlorperazine  (COMPAZINE ) 10 MG tablet Take 10 mg by mouth every 6 (six) hours as needed for nausea or vomiting.     simethicone  (GAS-X) 80 MG chewable tablet Chew 1 tablet (80 mg total) by mouth every 8 (eight) hours as needed for flatulence. 60 tablet 0   senna-docusate (SENOKOT-S) 8.6-50 MG tablet Take by mouth. (Patient not taking: Reported on 02/09/2024)     No current facility-administered medications for this visit.   Facility-Administered Medications Ordered in Other Visits  Medication Dose Route Frequency Provider Last Rate Last Admin   0.9 %  sodium chloride  infusion   Intravenous Continuous Babara Call, MD 10 mL/hr at 09/12/23 1241 Infusion Verify at 09/12/23 1241   0.9 %  sodium chloride  infusion   Intravenous Continuous Babara Call, MD   Stopped at 02/09/24 1139   calcium  gluconate 2 g in sodium chloride  0.9 % 100 mL IVPB  2 g Intravenous PRN Babara Call, MD   Stopped at 09/12/23 1101   heparin  lock flush 100 UNIT/ML injection            prochlorperazine  (COMPAZINE ) 10 MG tablet            sodium chloride  flush (NS) 0.9 % injection 10 mL  10 mL  Intravenous PRN Babara Call, MD   10 mL at 02/18/21 0858   sodium chloride  flush (NS) 0.9 % injection 10 mL  10 mL Intravenous Once Borders, Joshua R, NP       sodium chloride  flush (NS) 0.9 % injection 10 mL  10 mL Intravenous Once Babara Call, MD         PHYSICAL EXAMINATION: ECOG PERFORMANCE STATUS: 0 - Asymptomatic Vitals:   02/09/24 0913  BP: 123/77  Pulse: 60  Resp: 16  Temp: 97.9 F (36.6 C)  SpO2: 100%   Filed Weights   02/09/24 0913  Weight: 136 lb 12.5 oz (62 kg)     Physical Exam Constitutional:      General: She is not in acute distress. HENT:     Head:  Normocephalic and atraumatic.  Eyes:     General: No scleral icterus. Cardiovascular:     Rate and Rhythm: Normal rate and regular rhythm.     Heart sounds: Normal heart sounds.  Pulmonary:     Effort: Pulmonary effort is normal. No respiratory distress.  Abdominal:     General: Bowel sounds are normal. There is no distension.     Palpations: Abdomen is soft.  Musculoskeletal:        General: No deformity. Normal range of motion.     Cervical back: Normal range of motion and neck supple.  Skin:    General: Skin is warm.     Coloration: Skin is not jaundiced.     Findings: No erythema.  Neurological:     Mental Status: She is alert and oriented to person, place, and time. Mental status is at baseline.  Psychiatric:        Mood and Affect: Mood normal.     LABORATORY DATA:  I have reviewed the data as listed    Latest Ref Rng & Units 02/09/2024    8:52 AM 01/05/2024    8:48 AM 12/19/2023   12:43 PM  CBC  WBC 4.0 - 10.5 K/uL 5.8  4.6  6.7   Hemoglobin 12.0 - 15.0 g/dL 87.4  88.8  89.0   Hematocrit 36.0 - 46.0 % 37.6  33.3  32.5   Platelets 150 - 400 K/uL 179  234  183       Latest Ref Rng & Units 02/09/2024    8:52 AM 01/05/2024    8:48 AM 12/19/2023   12:43 PM  CMP  Glucose 70 - 99 mg/dL 890  85  873   BUN 8 - 23 mg/dL 15  15  15    Creatinine 0.44 - 1.00 mg/dL 9.31  9.23  9.32   Sodium 135 - 145 mmol/L 137  138  138   Potassium 3.5 - 5.1 mmol/L 3.8  3.9  3.7   Chloride 98 - 111 mmol/L 108  108  107   CO2 22 - 32 mmol/L 22  23  23    Calcium  8.9 - 10.3 mg/dL 8.9  8.6  8.5   Total Protein 6.5 - 8.1 g/dL 6.5  6.3  6.4   Total Bilirubin 0.0 - 1.2 mg/dL 0.5  0.7  0.7   Alkaline Phos 38 - 126 U/L 75  72  75   AST 15 - 41 U/L 31  35  30   ALT 0 - 44 U/L 23  26  28       RADIOGRAPHIC STUDIES: I have personally reviewed the radiological images as listed and agreed with the findings in the report. Reviewed  findings of MRI abdomen MRCP done at [name]. MM 3D SCREENING MAMMOGRAM  BILATERAL BREAST Result Date: 01/23/2024 CLINICAL DATA:  Screening. EXAM: DIGITAL SCREENING BILATERAL MAMMOGRAM WITH TOMOSYNTHESIS AND CAD TECHNIQUE: Bilateral screening digital craniocaudal and mediolateral oblique mammograms were obtained. Bilateral screening digital breast tomosynthesis was performed. The images were evaluated with computer-aided detection. COMPARISON:  Previous exam(s). ACR Breast Density Category c: The breasts are heterogeneously dense, which may obscure small masses. FINDINGS: There are no findings suspicious for malignancy. IMPRESSION: No mammographic evidence of malignancy. A result letter of this screening mammogram will be mailed directly to the patient. RECOMMENDATION: Screening mammogram in one year. (Code:SM-B-01Y) BI-RADS CATEGORY  1: Negative. Electronically Signed   By: Toribio Agreste M.D.   On: 01/23/2024 16:31   CT CHEST ABDOMEN PELVIS W CONTRAST Result Date: 01/02/2024 CLINICAL DATA:  Pancreatic cancer. Assess treatment response. * Tracking Code: BO * EXAM: CT CHEST, ABDOMEN, AND PELVIS WITH CONTRAST TECHNIQUE: Multidetector CT imaging of the chest, abdomen and pelvis was performed following the standard protocol during bolus administration of intravenous contrast. RADIATION DOSE REDUCTION: This exam was performed according to the departmental dose-optimization program which includes automated exposure control, adjustment of the mA and/or kV according to patient size and/or use of iterative reconstruction technique. CONTRAST:  OMNIPAQUE  IOHEXOL  300 MG/ML  SOLN COMPARISON:  10/03/2023 FINDINGS: CT CHEST FINDINGS Cardiovascular: Ascending thoracic aorta measures 3.7 cm in coronal dimension which is within normal limits (image 29/series). Mediastinum/Nodes: Port in the anterior chest wall with tip in distal SVC. No axillary or supraclavicular adenopathy. No mediastinal or hilar adenopathy. No pericardial fluid. Esophagus normal. Lungs/Pleura: Several peripheral nodules in  the LEFT lower lobe. Example 7 mm nodule image 80/4 compares to 7 mm remeasured. Adjacent small 5 mm nodule is also unchanged on image 75. Band like pleuroparenchymal thickening in the RIGHT lower lobe is unchanged. This is site of partial lung resection. 4 mm nodule in the RIGHT middle lobe unchanged. Other scattered nodules unchanged. No new pulmonary nodules are evident. Musculoskeletal: No aggressive osseous lesion. CT ABDOMEN AND PELVIS FINDINGS Hepatobiliary: No evidence of hepatic metastasis. Tiny hypodensity along the inferior RIGHT hepatic lobe again noted image 73/2. Postcholecystectomy choledocho urostomy without complication. Pancreas: Post Whipple procedure. Small amount of soft tissue previous described adjacent to to the SMA measures 10 mm 10 mm (image 76/series 2). No significant interval change. No peripancreatic adenopathy. Spleen: Normal spleen Adrenals/urinary tract: Adrenal glands and kidneys are normal. The ureters and bladder normal. Stomach/Bowel: Post partial gastrectomy related level procedure. Small bowel normal. Pancreas scratch the appendix normal. The colon and rectosigmoid colon are normal. Vascular/Lymphatic: Abdominal aorta is normal caliber. There is no retroperitoneal or periportal lymphadenopathy. No pelvic lymphadenopathy. Reproductive: Uterus normal. LEFT ovarian cyst again noted measuring 5.3 x 3.3 cm compared to 5.1 x 3.1 cm. Other: No peritoneal or omental metastatic nodularity. Musculoskeletal: No aggressive osseous lesion. IMPRESSION: CHEST: 1. Stable small pulmonary nodules. 2. No thoracic adenopathy. PELVIS: 1. Stable small volume soft tissue adjacent to the SMA. 2. No evidence of progressive metastatic disease in the abdomen pelvis. 3. Stable LEFT ovarian cyst. 4. Ascending thoracic aorta is within normal limits in diameter. Electronically Signed   By: Jackquline Boxer M.D.   On: 01/02/2024 10:01

## 2024-02-09 NOTE — Assessment & Plan Note (Signed)
 S/p  Whipple procedure..  Continue Creon , continue 24000 units TID with meals and  with her snacks

## 2024-02-10 LAB — CEA: CEA: 5.1 ng/mL — ABNORMAL HIGH (ref 0.0–4.7)

## 2024-02-10 LAB — CANCER ANTIGEN 19-9: CA 19-9: 19 U/mL (ref 0–35)

## 2024-02-23 ENCOUNTER — Encounter: Payer: Self-pay | Admitting: Oncology

## 2024-02-23 ENCOUNTER — Inpatient Hospital Stay

## 2024-02-23 ENCOUNTER — Inpatient Hospital Stay (HOSPITAL_BASED_OUTPATIENT_CLINIC_OR_DEPARTMENT_OTHER): Admitting: Oncology

## 2024-02-23 VITALS — BP 140/83 | HR 59 | Resp 18

## 2024-02-23 VITALS — BP 141/74 | HR 58 | Temp 98.9°F | Resp 18 | Wt 137.9 lb

## 2024-02-23 DIAGNOSIS — Z5111 Encounter for antineoplastic chemotherapy: Secondary | ICD-10-CM | POA: Diagnosis not present

## 2024-02-23 DIAGNOSIS — C259 Malignant neoplasm of pancreas, unspecified: Secondary | ICD-10-CM

## 2024-02-23 DIAGNOSIS — K8689 Other specified diseases of pancreas: Secondary | ICD-10-CM

## 2024-02-23 DIAGNOSIS — G620 Drug-induced polyneuropathy: Secondary | ICD-10-CM

## 2024-02-23 DIAGNOSIS — T451X5A Adverse effect of antineoplastic and immunosuppressive drugs, initial encounter: Secondary | ICD-10-CM

## 2024-02-23 LAB — CMP (CANCER CENTER ONLY)
ALT: 21 U/L (ref 0–44)
AST: 29 U/L (ref 15–41)
Albumin: 3.7 g/dL (ref 3.5–5.0)
Alkaline Phosphatase: 83 U/L (ref 38–126)
Anion gap: 8 (ref 5–15)
BUN: 13 mg/dL (ref 8–23)
CO2: 23 mmol/L (ref 22–32)
Calcium: 8.8 mg/dL — ABNORMAL LOW (ref 8.9–10.3)
Chloride: 106 mmol/L (ref 98–111)
Creatinine: 0.64 mg/dL (ref 0.44–1.00)
GFR, Estimated: >60 mL/min
Glucose, Bld: 160 mg/dL — ABNORMAL HIGH (ref 70–99)
Potassium: 4 mmol/L (ref 3.5–5.1)
Sodium: 137 mmol/L (ref 135–145)
Total Bilirubin: 0.7 mg/dL (ref 0.0–1.2)
Total Protein: 6.4 g/dL — ABNORMAL LOW (ref 6.5–8.1)

## 2024-02-23 LAB — CBC WITH DIFFERENTIAL (CANCER CENTER ONLY)
Abs Immature Granulocytes: 0.02 K/uL (ref 0.00–0.07)
Basophils Absolute: 0 K/uL (ref 0.0–0.1)
Basophils Relative: 1 %
Eosinophils Absolute: 0.1 K/uL (ref 0.0–0.5)
Eosinophils Relative: 1 %
HCT: 35.3 % — ABNORMAL LOW (ref 36.0–46.0)
Hemoglobin: 12.1 g/dL (ref 12.0–15.0)
Immature Granulocytes: 1 %
Lymphocytes Relative: 28 %
Lymphs Abs: 1.3 K/uL (ref 0.7–4.0)
MCH: 32.9 pg (ref 26.0–34.0)
MCHC: 34.3 g/dL (ref 30.0–36.0)
MCV: 95.9 fL (ref 80.0–100.0)
Monocytes Absolute: 0.3 K/uL (ref 0.1–1.0)
Monocytes Relative: 7 %
Neutro Abs: 2.7 K/uL (ref 1.7–7.7)
Neutrophils Relative %: 62 %
Platelet Count: 271 K/uL (ref 150–400)
RBC: 3.68 MIL/uL — ABNORMAL LOW (ref 3.87–5.11)
RDW: 13 % (ref 11.5–15.5)
WBC Count: 4.4 K/uL (ref 4.0–10.5)
nRBC: 0 % (ref 0.0–0.2)

## 2024-02-23 LAB — GENETIC SCREENING ORDER

## 2024-02-23 MED ORDER — SODIUM CHLORIDE 0.9 % IV SOLN
1000.0000 mg/m2 | Freq: Once | INTRAVENOUS | Status: AC
  Administered 2024-02-23: 1710 mg via INTRAVENOUS
  Filled 2024-02-23: qty 44.97

## 2024-02-23 MED ORDER — SODIUM CHLORIDE 0.9 % IV SOLN
INTRAVENOUS | Status: DC
  Filled 2024-02-23: qty 250

## 2024-02-23 MED ORDER — PACLITAXEL PROTEIN-BOUND CHEMO INJECTION 100 MG
100.0000 mg/m2 | Freq: Once | INTRAVENOUS | Status: AC
  Administered 2024-02-23: 175 mg via INTRAVENOUS
  Filled 2024-02-23: qty 35

## 2024-02-23 NOTE — Assessment & Plan Note (Signed)
 Chemotherapy plan as listed above

## 2024-02-23 NOTE — Assessment & Plan Note (Signed)
 Mostly bothering her toes, grade 2.  She did not tolerate Cymbalta .   Stable symptoms She gets acupuncture sessions which help her symptoms.

## 2024-02-23 NOTE — Assessment & Plan Note (Signed)
 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 Continue calcium supplementation, 2000 mg daily-divided in 2-3 doses IV calcium gluconate 2g if calcium <=8.5 Normal vitamin D level

## 2024-02-23 NOTE — Patient Instructions (Addendum)
 [facility] - A DEPT OF [facility]  Discharge Instructions: Thank you for choosing [facility] to provide your oncology and hematology care.  If you have a lab appointment with the Cancer Center, please go directly to the Cancer Center and check in at the registration area.  Wear comfortable clothing and clothing appropriate for easy access to any Portacath or PICC line.   We strive to give you quality time with your provider. You may need to reschedule your appointment if you arrive late (15 or more minutes).  Arriving late affects you and other patients whose appointments are after yours.  Also, if you miss three or more appointments without notifying the office, you may be dismissed from the clinic at the provider's discretion.      For prescription refill requests, have your pharmacy contact our office and allow 72 hours for refills to be completed.    Today you received the following chemotherapy and/or immunotherapy agents Abraxane & Gemzar      To help prevent nausea and vomiting after your treatment, we encourage you to take your nausea medication as directed.  BELOW ARE SYMPTOMS THAT SHOULD BE REPORTED IMMEDIATELY: *FEVER GREATER THAN 100.4 F (38 C) OR HIGHER *CHILLS OR SWEATING *NAUSEA AND VOMITING THAT IS NOT CONTROLLED WITH YOUR NAUSEA MEDICATION *UNUSUAL SHORTNESS OF BREATH *UNUSUAL BRUISING OR BLEEDING *URINARY PROBLEMS (pain or burning when urinating, or frequent urination) *BOWEL PROBLEMS (unusual diarrhea, constipation, pain near the anus) TENDERNESS IN MOUTH AND THROAT WITH OR WITHOUT PRESENCE OF ULCERS (sore throat, sores in mouth, or a toothache) UNUSUAL RASH, SWELLING OR PAIN  UNUSUAL VAGINAL DISCHARGE OR ITCHING   Items with * indicate a potential emergency and should be followed up as soon as possible or go to the Emergency Department if any problems should occur.  Please show the CHEMOTHERAPY ALERT CARD or  IMMUNOTHERAPY ALERT CARD at check-in to the Emergency Department and triage nurse.  Should you have questions after your visit or need to cancel or reschedule your appointment, please contact [facility] - A DEPT OF [facility]  [phone] and follow the prompts.  Office hours are 8:00 a.m. to 4:30 p.m. Monday - Friday. Please note that voicemails left after 4:00 p.m. may not be returned until the following business day.  We are closed weekends and major holidays. You have access to a nurse at all times for urgent questions. Please call the main number to the clinic [phone] and follow the prompts.  For any non-urgent questions, you may also contact your provider using MyChart. We now offer e-Visits for anyone 17 and older to request care online for non-urgent symptoms. For details visit [email or web address].   Also download the MyChart app! Go to the app store, search "MyChart", open the app, select [place], and log in with your MyChart username and password.

## 2024-02-23 NOTE — Assessment & Plan Note (Signed)
 S/p  Whipple procedure..  Continue Creon , continue 24000 units TID with meals and  with her snacks

## 2024-02-23 NOTE — Progress Notes (Signed)
 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  Pathology proved liver metastatic disease- additional chemotherapy --> whipple procedure.ypT1b ypN0--> adjuvant Gem/Abraxane  finished in August 2023--> Sept 2024 CT showed progression in lung --> Nov 2024 Wedge biopsy- Recurrent pancreatic cancer with lung metastasis.  Currently on Gemcitabine  and Abraxane  1 week on 1 week off schedule due to neutropenia.  Labs are reviewed and discussed with patient.  July CT - stable--> chemo break Signatera circulating tumor DNA testing- negative.  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  She did not tolerate Cymbalta .   Stable symptoms She gets acupuncture sessions which help her symptoms.   Encounter for antineoplastic chemotherapy Chemotherapy plan as listed above  Hypocalcemia Likely due to malabsorption. Continue calcium  supplementation, 2000 mg daily-divided in 2-3 doses IV calcium  gluconate 2g if calcium  <=8.5 Normal vitamin D  level   Pancreatic insufficiency S/p  Whipple procedure..  Continue Creon , continue 24000 units TID with meals and  with her snacks    No orders of the defined types were placed in this encounter.   Follow up  2 weeks lab MD Gemcitaibine Abraxane   All questions were answered. The patient knows to call the clinic with any problems, questions  or concerns.  Call Babara, MD, PhD [facility] Hematology Oncology 02/23/2024   Babara Call, MD    HISTORY OF PRESENTING ILLNESS:   Cassandra [name] is a  68 y.o.  female with presents for follow up of Stage IV pancreatic adenocarcinoma Patient initially presented with jaundice, transaminitis, bilirubin was 9.9.  CA 19-9 was 1874.  Patient also reports unintentional weight loss. 02/08/2020 MRI abdomen and MRCP with and without contrast was done at [facility] which showed pancreatic head mass measuring up to 3 cm, with marked associated narrowing of the portal vein confluence.  SMA is preserved.  Marked intrahepatic and extrahepatic biliary duct dilatation as well as mild dilatation of the main pancreatic duct. Multiple hepatic masses highly concerning for metastatic disease.  Patient underwent EUS on 02/19/2020, which showed irregular mass identified in the pancreatic head, hypoechoic, measured 60mmx33mm, sonographic evidence concerning for invasion into the superior mesenteric artery.  There is no sign of significant abnormality in the main pancreatic duct.  Dilatation of common bile duct which measured up to 16 mm.  Region of celiac artery was visualized and showed no signs of significant abnormality.  No lymphadenopathy.  FNA showed adenocarcinoma.  02/19/2020, ERCP, malignant.  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  The stricture was treated with placement of wall flex metal stent.  Patient was seen by [facility] oncology Dr. [name] and was recommended for 3 drug regimen FOLFIRINOX.  Patient prefers to do chemotherapy locally at [facility].  Patient was referred to establish care today. She denies any pain.  Since stent placement, skin jaundice has improved.  Itchiness has also improved. Patient was accompanied by her husband today.  She has a family history of breast  cancer in sister and paternal aunt, colon cancer paternal grandmother.  #No reportable  targetable mutation on NGS 9/14/2021cycle 1 FOLFIRINOX.  Patient received oxaliplatin  and about 50% of Irinotecan  on day 1 and had experienced neurologic symptoms.  She went to ER and working diagnosis is TIA, and eventually I think this is due to irinotecan  side effects -irinotecan -associated dysarthria, lip/tongue numbness.  Adjustment was made for Irinotecan  to be  infused over 180 minutes.  Atropine  0.5 mg once prior to the irinotecan . No recurrent symptoms.   # 03/19/2020-03/21/2020 patient was admitted due to sepsis with strep pneumonia bacteremia.  Patient was treated with IV Rocephin .  TEE was done which showed no vegetation.  No PFO or ASD.  Patient was discharged home and he finished full course of 14 days of IV Rocephin  on 04/02/2020 per ID recommendation.  Repeat blood culture was also negative.  08/25/20 cycle 10 FOLFIRINOX 09/08/20- present  Starting cycle 11, FOLFIRI, Oxaliplatin  discontinued due to neuropathy   #NGS showed no reportable targetable mutation #Genetic testing-[employer] diagnostic testing showed no pathological variants identified. MS stable, TMB 0mut/mb, KRAS G12D, SF3B1 K700E, TP53 V252fs*30  #01/14/2021  CT done at [facility] during the interval, stable disease.  # Her case was presented by Dr.Jia from [facility] tumor board. [name] were 4~5 liver lesions on OSH MRI last year at the time of diagnosis highly suspicious for metastasis. She is not eligible for surgical protocol for metastasis resection given the number of liver metastasis is >3. However given her excellent and durable response to chemo, surgical resection may be considered pending sustained disease control at 1 year and may require partial hepatectomy first to see if there is viable tumor before proceeding to Whipple resection ]  Patient had a COVID-19 infection in September 2022.  03/23/2021 CT abdomen pelvis No significant change in the ill-defined pancreatic head mass or  associated biliary ductal and pancreatic ductal  dilation.  Slight interval enlargement of subcentimeter anterior peripancreatic  lymph node, nonspecific. Attention on follow-up per clinical protocol. Similar enlargement of the ascending thoracic aorta measuring 4.4 cm  03/23/2021 MRI abdomen w and wo  Increasing ill-defined hypoenhancement of the pancreatic head surrounding the common bile duct stent, in keeping with known pancreatic malignancy. Anterior peripancreatic lymph node is better evaluated on CT.  Unchanged position of a common bile duct stent with similar mild diffuse intrahepatic ductal dilation and pneumobilia. No evidence of metastatic disease in the abdomen or pelvis. Partially visualized cystic left adnexal lesion measuring up to 3.9 cm.   04/10/2021, patient underwent liver resection at [facility], by Dr. Zani.   Pathology report from [facility] was reviewed. Segments 3 partial hepatectomy, negative for viable tumor. Section 5/6 partial hepatectomy part 1 and 2, microscopic foci of residual viable adenocarcinoma, morphologically consistent with history of pancreatic primary.  Parenchymal margin is uninvolved.  #Patient resumed on FOLFIRI on 04/28/2021. She got another cycle on 05/12/2021 #05/26/2021, patient did not get additional chemotherapy due to transaminitis.  Shared decision was made to stop FOLFIRI and switch to gemcitabine  Abraxane  treatments.  05/26/2021, patient developed transaminitis, AST 763, ALT 691, alkaline phosphatase 364.  Bilirubin 0.7 05/26/2021 stat ultrasound abdomen right upper quadrant showed interval development of 3.3 x 1.8 cm complex mass in the right hepatic lobe.  Increased extrahepatic ductal dilatation.  Concerning for CBD obstruction. 05/29/2021, MRI abdomen MRCP with and without contrast showed unchanged pancreatic head soft tissue.  Severe intra and extrahepatic biliary ductal dilatation.  Common bile duct stents remain in position however  patency is not established.  No evidence of lymphadenopathy or  metastatic disease in the abdomen.  With further communication with radiologist, addendum was added that there is internal fluid signal and no appreciable associated contrast enhancement measuring 3.4 x 2.3 cm.  This appearance is generally not consistent with metastasis and is of uncertain significance.  Possibly reflecting hepatic abscess or residual of subcapsular hematoma.  Patient was seen by [facility] Dr. Oneal team.  He had ERCP done on 06/05/2021, with findings of One stent from the biliary tree was seen in the major papilla. The stent had migrated significantly into the duodenum. This is the cause of stent malfunction. One stent was removed from the biliary tree. Prior biliary sphincterotomy appeared open. - A single severe biliary stricture was found in the lower third of the main bile duct with upstream dilation. The stricture was alignant appearing.- The biliary tree was swept and sludge was found.- One fully covered metal stent was placed into the common bile duct across the stricture.- No pancreatogram performed.  06/12/2021-08/10/2021 gemcitabine  and Abraxane .   10/14/2021, patient underwent Whipple procedure. Liver biopsy negative for malignancy. Review procedure showed invasive adenocarcinoma, moderately differentiated, centered in pancreatic head and confined in the Pancreas. Surgical margin is negative for malignancy.  36 lymph nodes were all negative for malignancy.  1 hepatic artery lymph node was harvested and was negative.  Gallbladder negative for malignancy.  Cystic duct excision negative for malignancy. pT1b pN0  10/25/2021, CT abdomen pelvis with contrast showed rim-enhancing fluid collection in the region of the hepatic hilum. Patient had a readmission for superficial wound infection.  She completed antibiotics on 11/03/2021.  11/03/2021, CT abdomen pelvis with contrast showed near resolution of previously seen fluid collection in the region of the hepatic hilum.  Similar appearance of  the area of hypoenhancement in the liver.  Attention on follow-up.  11/17/2021, resumed on gemcitabine /Abraxane . 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  12/12/ 2023 CT scan at [facility] shows no evidence of cancer recurrence.-NED 09/21/2022 CT chest abdomen pelvis w contrast at [facility] showed-No evidence of metastatic disease in the chest, abdomen, or pelvis. 12/21/2022 CT chest abdomen pelvis w contrast at [facility] showed-No evidence of metastatic disease in the chest, abdomen, or pelvis.   03/22/2023 CT scan at [facility] showed enlarging lung nodules. RLL 1cm, LLL 0.5cm, RUL 0.5cm Stable hypoattenuating right inferior hepatic lobe lesion measuring 0.9 cm.  Similar left adnexal cystic lesion measuring up to 5.0 cm.   04/06/2023 s/p lung nodule biopsy.  Rare atypical epithelioid cells present with evidence suggestive of invasion, suspicious but not diagnostic for malignancy.   05/20/2023: Right robot assisted thoracoscopic wedge resection with MLND (Dr. Murlean)   Pathology:  Suspicious for malignancy but not diagnostic A. Lymph node, level 8, biopsy: Metastatic carcinoma involving one lymph node (1/1).  B. Lung, right lower lobe, wedge resection: Lung tissue positive for adenocarcinoma, consistent with metastasis from pancreas primary. See comment. Tumor size: 1.0 cm. Operative margin: Negative  Comment: Note is made of the patient's history of invasive adenocarcinoma of the pancreatic head ([id number]; 10/14/2021), which is compared to the current case. Comparison is challenging given the post-treatment nature of the pancreatic tumor; however, there are some morphologic similarities.  Although often unhelpful in the setting of lung primary versus pancreatic primary, immunohistochemical stains were performed. Tumor cells demonstrate the below immunoprofile which would could support pulmonary adenocarcinoma with enteric differentiation but would also be consistent with metastasis from the patient's known  pancreatic primary;  the latter is favored.              Positive                             Negative CK7 (strong, diffuse) TTF-1  CK20 (strong, focal)                    Positive and negative controls perform appropriately.    C. Lymph node, level 7, biopsy: Metastatic carcinoma involving one lymph node (1/1).  D. Lymph node, level 4R, biopsy: One lymph node, negative for malignancy (0/1).   INTERVAL HISTORY Cassandra [name] is a 68 y.o. female who has above history reviewed by me today presents for stage IV pancreatic cancer.   She tolerates Gemcitabine  Abraxane  1 week on and 1 week off, overall she tolerates well.  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  She gets acupuncture session which helped her symptoms.  She takes calcium  supplementation as instructed.  She take Creon  for pancreatic insufficiency  + fatigue after chemotherapy + mild stuffy nose, no fever, chills, sore throat.    Review of Systems  Constitutional:  Positive for fatigue. Negative for appetite change, chills, fever and unexpected weight change.  HENT:   Negative for hearing loss and voice change.   Eyes:  Negative for eye problems.  Respiratory:  Negative for chest tightness and cough.   Cardiovascular:  Negative for chest pain.  Gastrointestinal:  Negative for abdominal distention, abdominal pain, blood in stool and nausea.  Endocrine: Negative for hot flashes.  Genitourinary:  Negative for difficulty urinating and frequency.   Musculoskeletal:  Negative for arthralgias.  Skin:  Negative for itching and rash.  Neurological:  Positive for numbness. Negative for extremity weakness.  Hematological:  Negative for adenopathy.  Psychiatric/Behavioral:  Negative for confusion.     MEDICAL HISTORY:  Past Medical History:  Diagnosis Date   Allergy    Anemia    Bacteremia due to Streptococcus pneumoniae 03/19/2020   Cancer [facility])    pancreatic cancer    Colon polyps    Family history of breast cancer    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   Neutropenia (HCC) 04/18/2020   Osteopenia after menopause 05/2017   femoral neck T score -2.0   Personal history of chemotherapy    current for pancreatic ca   PONV (postoperative nausea and vomiting)    Pure hypercholesterolemia    Sepsis (HCC) 03/19/2020    SURGICAL HISTORY: Past Surgical History:  Procedure Laterality Date   COLONOSCOPY  07/2015   WNL   COLONOSCOPY  2008/2011   COLONOSCOPY WITH PROPOFOL  N/A 07/10/2021   Procedure: COLONOSCOPY WITH PROPOFOL ;  Surgeon: Maryruth Ole DASEN, MD;  Location: [facility] ENDOSCOPY;  Service: Endoscopy;  Laterality: N/A;   IR CV LINE INJECTION  04/28/2021   OVARIAN CYST REMOVAL  1992   dermoid-Dr CAK   PORTA CATH INSERTION N/A 03/07/2020   Procedure: PORTA CATH INSERTION;  Surgeon: Marea Selinda RAMAN, MD;  Location: [facility] INVASIVE CV LAB;  Service: Cardiovascular;  Laterality: N/A;   TEE WITHOUT CARDIOVERSION N/A 03/21/2020   Procedure: TRANSESOPHAGEAL ECHOCARDIOGRAM (TEE);  Surgeon: Fernand Denyse LABOR, MD;  Location: [facility] ORS;  Service: Cardiovascular;  Laterality: N/A;   TUBAL LIGATION  1993    SOCIAL HISTORY: Social History   Socioeconomic History   Marital status: Married  Spouse name: Not on file   Number of children: 2   Years of education: Not on file   Highest education level: Bachelor's degree (e.g., BA, AB, BS)  Occupational History   Occupation: Runner, broadcasting/film/video    Comment: retired   Occupation: Visual merchandiser  Tobacco Use   Smoking status: Former    Current packs/day: 0.00    Average packs/day: 1 pack/day for 10.0 years (10.0 ttl pk-yrs)    Types: Cigarettes    Start date: 06/29/1975    Quit date: 06/28/1985    Years since quitting: 38.6   Smokeless tobacco: Never   Tobacco comments:    Quit smoking 1987  Vaping Use   Vaping status: Never Used  Substance and Sexual Activity   Alcohol use: Yes    Alcohol/week: 5.0 standard drinks of  alcohol    Types: 5 Glasses of wine per week    Comment: 0-2 mixed drinks a day   Drug use: No   Sexual activity: Not Currently    Birth control/protection: Post-menopausal  Other Topics Concern   Not on file  Social History Narrative   Not on file   Social Drivers of Health   Financial Resource Strain: Low Risk  (10/18/2023)   Overall Financial Resource Strain (CARDIA)    Difficulty of Paying Living Expenses: Not hard at all  Food Insecurity: No Food Insecurity (10/18/2023)   Hunger Vital Sign    Worried About Running Out of Food in the Last Year: Never true    Ran Out of Food in the Last Year: Never true  Transportation Needs: No Transportation Needs (10/18/2023)   PRAPARE - Administrator, Civil Service (Medical): No    Lack of Transportation (Non-Medical): No  Physical Activity: Sufficiently Active (10/18/2023)   Exercise Vital Sign    Days of Exercise per Week: 6 days    Minutes of Exercise per Session: 30 min  Stress: No Stress Concern Present (10/18/2023)   [employer] of Occupational Health - Occupational Stress Questionnaire    Feeling of Stress : Not at all  Social Connections: Moderately Integrated (10/18/2023)   Social Connection and Isolation Panel    Frequency of Communication with Friends and Family: More than three times a week    Frequency of Social Gatherings with Friends and Family: More than three times a week    Attends Religious Services: More than 4 times per year    Active Member of [employer] or Organizations: No    Attends Banker Meetings: Never    Marital Status: Married  Catering manager Violence: Not At Risk (10/18/2023)   Humiliation, Afraid, Rape, and Kick questionnaire    Fear of Current or Ex-Partner: No    Emotionally Abused: No    Physically Abused: No    Sexually Abused: No    FAMILY HISTORY: Family History  Problem Relation Age of Onset   Breast cancer Paternal Aunt 55   Diabetes Mother    Osteoporosis Mother     Hyperlipidemia Father    Rheumatic fever Father    Valvular heart disease Father    Cancer Paternal Grandmother        possible colon   Breast cancer Sister 46    ALLERGIES:  is allergic to penicillin g.  MEDICATIONS:  Current Outpatient Medications  Medication Sig Dispense Refill   aspirin  81 MG chewable tablet Chew 81 mg by mouth daily.     b complex vitamins capsule Take 1 capsule by mouth daily.  Calcium  Carbonate-Vit D-Min (CALCIUM  1200 PO) Take by mouth.     Cholecalciferol  25 MCG (1000 UT) tablet Take 2,000 Units by mouth daily.     clobetasol  cream (TEMOVATE ) 0.05 %      fluticasone (FLONASE) 50 MCG/ACT nasal spray Place 1 spray into both nostrils daily.     lidocaine -prilocaine  (EMLA ) cream Apply small amount to port and cover with saran wrap 1-2 hours prior to port access 30 g 6   loratadine (CLARITIN REDITABS) 10 MG dissolvable tablet Take 10 mg by mouth daily.     Melatonin 2.5 MG CHEW Chew by mouth.     Multiple Vitamins-Minerals (MULTIVITAMIN WITH MINERALS) tablet Take 1 tablet by mouth daily.     ondansetron  (ZOFRAN -ODT) 8 MG disintegrating tablet Take 1 tablet (8 mg total) by mouth every 8 (eight) hours as needed for nausea or vomiting. 60 tablet 2   Pancrelipase , Lip-Prot-Amyl, (CREON ) 24000-76000 units CPEP TAKE 1 CAPSULE THREE TIMES DAILY BEFORE MEALS 200 capsule 3   prochlorperazine  (COMPAZINE ) 10 MG tablet Take 10 mg by mouth every 6 (six) hours as needed for nausea or vomiting.     simethicone  (GAS-X) 80 MG chewable tablet Chew 1 tablet (80 mg total) by mouth every 8 (eight) hours as needed for flatulence. 60 tablet 0   senna-docusate (SENOKOT-S) 8.6-50 MG tablet Take by mouth. (Patient not taking: Reported on 02/23/2024)     No current facility-administered medications for this visit.   Facility-Administered Medications Ordered in Other Visits  Medication Dose Route Frequency Provider Last Rate Last Admin   0.9 %  sodium chloride  infusion   Intravenous  Continuous Babara Call, MD 10 mL/hr at 09/12/23 1241 Infusion Verify at 09/12/23 1241   0.9 %  sodium chloride  infusion   Intravenous Continuous Babara Call, MD   Stopped at 02/23/24 1223   calcium  gluconate 2 g in sodium chloride  0.9 % 100 mL IVPB  2 g Intravenous PRN Babara Call, MD   Stopped at 09/12/23 1101   heparin  lock flush 100 UNIT/ML injection            prochlorperazine  (COMPAZINE ) 10 MG tablet            sodium chloride  flush (NS) 0.9 % injection 10 mL  10 mL Intravenous PRN Babara Call, MD   10 mL at 02/18/21 0858   sodium chloride  flush (NS) 0.9 % injection 10 mL  10 mL Intravenous Once Borders, Fonda SAUNDERS, NP       sodium chloride  flush (NS) 0.9 % injection 10 mL  10 mL Intravenous Once Babara Call, MD         PHYSICAL EXAMINATION: ECOG PERFORMANCE STATUS: 0 - Asymptomatic Vitals:   02/23/24 0923  BP: (!) 141/74  Pulse: (!) 58  Resp: 18  Temp: 98.9 F (37.2 C)  SpO2: 99%   Filed Weights   02/23/24 0923  Weight: 137 lb 14.4 oz (62.6 kg)     Physical Exam Constitutional:      General: She is not in acute distress. HENT:     Head: Normocephalic and atraumatic.  Eyes:     General: No scleral icterus. Cardiovascular:     Rate and Rhythm: Normal rate and regular rhythm.     Heart sounds: Normal heart sounds.  Pulmonary:     Effort: Pulmonary effort is normal. No respiratory distress.  Abdominal:     General: Bowel sounds are normal. There is no distension.     Palpations: Abdomen is soft.  Musculoskeletal:  General: No deformity. Normal range of motion.     Cervical back: Normal range of motion and neck supple.  Skin:    General: Skin is warm.     Coloration: Skin is not jaundiced.     Findings: No erythema.  Neurological:     Mental Status: She is alert and oriented to person, place, and time. Mental status is at baseline.  Psychiatric:        Mood and Affect: Mood normal.     LABORATORY DATA:  I have reviewed the data as listed    Latest Ref Rng & Units  02/23/2024    9:05 AM 02/09/2024    8:52 AM 01/05/2024    8:48 AM  CBC  WBC 4.0 - 10.5 K/uL 4.4  5.8  4.6   Hemoglobin 12.0 - 15.0 g/dL 87.8  87.4  88.8   Hematocrit 36.0 - 46.0 % 35.3  37.6  33.3   Platelets 150 - 400 K/uL 271  179  234       Latest Ref Rng & Units 02/23/2024    9:05 AM 02/09/2024    8:52 AM 01/05/2024    8:48 AM  CMP  Glucose 70 - 99 mg/dL 839  890  85   BUN 8 - 23 mg/dL 13  15  15    Creatinine 0.44 - 1.00 mg/dL 9.35  9.31  9.23   Sodium 135 - 145 mmol/L 137  137  138   Potassium 3.5 - 5.1 mmol/L 4.0  3.8  3.9   Chloride 98 - 111 mmol/L 106  108  108   CO2 22 - 32 mmol/L 23  22  23    Calcium  8.9 - 10.3 mg/dL 8.8  8.9  8.6   Total Protein 6.5 - 8.1 g/dL 6.4  6.5  6.3   Total Bilirubin 0.0 - 1.2 mg/dL 0.7  0.5  0.7   Alkaline Phos 38 - 126 U/L 83  75  72   AST 15 - 41 U/L 29  31  35   ALT 0 - 44 U/L 21  23  26       RADIOGRAPHIC STUDIES: I have personally reviewed the radiological images as listed and agreed with the findings in the report. Reviewed findings of MRI abdomen [facility]CP done at [facility]. MM 3D SCREENING MAMMOGRAM BILATERAL BREAST Result Date: 01/23/2024 CLINICAL DATA:  Screening. EXAM: DIGITAL SCREENING BILATERAL MAMMOGRAM WITH TOMOSYNTHESIS AND CAD TECHNIQUE: Bilateral screening digital craniocaudal and mediolateral oblique mammograms were obtained. Bilateral screening digital breast tomosynthesis was performed. The images were evaluated with computer-aided detection. COMPARISON:  Previous exam(s). ACR Breast Density Category c: The breasts are heterogeneously dense, which may obscure small masses. FINDINGS: There are no findings suspicious for malignancy. IMPRESSION: No mammographic evidence of malignancy. A result letter of this screening mammogram will be mailed directly to the patient. RECOMMENDATION: Screening mammogram in one year. (Code:SM-B-01Y) BI-RADS CATEGORY  1: Negative. Electronically Signed   By: Toribio Agreste M.D.   On: 01/23/2024 16:31   CT CHEST  ABDOMEN PELVIS W CONTRAST Result Date: 01/02/2024 CLINICAL DATA:  Pancreatic cancer. Assess treatment response. * Tracking Code: BO * EXAM: CT CHEST, ABDOMEN, AND PELVIS WITH CONTRAST TECHNIQUE: Multidetector CT imaging of the chest, abdomen and pelvis was performed following the standard protocol during bolus administration of intravenous contrast. RADIATION DOSE REDUCTION: This exam was performed according to the departmental dose-optimization program which includes automated exposure control, adjustment of the mA and/or kV according to patient size and/or use of  iterative reconstruction technique. CONTRAST:  OMNIPAQUE  IOHEXOL  300 MG/ML  SOLN COMPARISON:  10/03/2023 FINDINGS: CT CHEST FINDINGS Cardiovascular: Ascending thoracic aorta measures 3.7 cm in coronal dimension which is within normal limits (image 29/series). Mediastinum/Nodes: Port in the anterior chest wall with tip in distal SVC. No axillary or supraclavicular adenopathy. No mediastinal or hilar adenopathy. No pericardial fluid. Esophagus normal. Lungs/Pleura: Several peripheral nodules in the LEFT lower lobe. Example 7 mm nodule image 80/4 compares to 7 mm remeasured. Adjacent small 5 mm nodule is also unchanged on image 75. Band like pleuroparenchymal thickening in the RIGHT lower lobe is unchanged. This is site of partial lung resection. 4 mm nodule in the RIGHT middle lobe unchanged. Other scattered nodules unchanged. No new pulmonary nodules are evident. Musculoskeletal: No aggressive osseous lesion. CT ABDOMEN AND PELVIS FINDINGS Hepatobiliary: No evidence of hepatic metastasis. Tiny hypodensity along the inferior RIGHT hepatic lobe again noted image 73/2. Postcholecystectomy choledocho urostomy without complication. Pancreas: Post Whipple procedure. Small amount of soft tissue previous described adjacent to to the SMA measures 10 mm 10 mm (image 76/series 2). No significant interval change. No peripancreatic adenopathy. Spleen: Normal  spleen Adrenals/urinary tract: Adrenal glands and kidneys are normal. The ureters and bladder normal. Stomach/Bowel: Post partial gastrectomy related level procedure. Small bowel normal. Pancreas scratch the appendix normal. The colon and rectosigmoid colon are normal. Vascular/Lymphatic: Abdominal aorta is normal caliber. There is no retroperitoneal or periportal lymphadenopathy. No pelvic lymphadenopathy. Reproductive: Uterus normal. LEFT ovarian cyst again noted measuring 5.3 x 3.3 cm compared to 5.1 x 3.1 cm. Other: No peritoneal or omental metastatic nodularity. Musculoskeletal: No aggressive osseous lesion. IMPRESSION: CHEST: 1. Stable small pulmonary nodules. 2. No thoracic adenopathy. PELVIS: 1. Stable small volume soft tissue adjacent to the SMA. 2. No evidence of progressive metastatic disease in the abdomen pelvis. 3. Stable LEFT ovarian cyst. 4. Ascending thoracic aorta is within normal limits in diameter. Electronically Signed   By: Jackquline Boxer M.D.   On: 01/02/2024 10:01

## 2024-02-23 NOTE — Assessment & Plan Note (Addendum)
 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  Pathology proved liver metastatic disease- additional chemotherapy --> whipple procedure.ypT1b ypN0--> adjuvant Gem/Abraxane  finished in August 2023--> Sept 2024 CT showed progression in lung --> Nov 2024 Wedge biopsy- Recurrent pancreatic cancer with lung metastasis.  Currently on Gemcitabine  and Abraxane  1 week on 1 week off schedule due to neutropenia.  Labs are reviewed and discussed with patient.  July CT - stable--> chemo break Signatera circulating tumor DNA testing- negative.  Proceed with chemotherapy with Gemcitabine  and Abraxane  every other week

## 2024-03-03 LAB — SIGNATERA
SIGNATERA MTM READOUT: 0 MTM/ml
SIGNATERA TEST RESULT: NEGATIVE

## 2024-03-13 ENCOUNTER — Encounter: Payer: Self-pay | Admitting: Oncology

## 2024-03-13 ENCOUNTER — Inpatient Hospital Stay

## 2024-03-13 ENCOUNTER — Inpatient Hospital Stay (HOSPITAL_BASED_OUTPATIENT_CLINIC_OR_DEPARTMENT_OTHER): Admitting: Oncology

## 2024-03-13 ENCOUNTER — Inpatient Hospital Stay: Attending: Oncology

## 2024-03-13 VITALS — BP 142/77 | HR 63 | Temp 98.6°F | Resp 18 | Wt 136.9 lb

## 2024-03-13 DIAGNOSIS — C250 Malignant neoplasm of head of pancreas: Secondary | ICD-10-CM | POA: Diagnosis not present

## 2024-03-13 DIAGNOSIS — C259 Malignant neoplasm of pancreas, unspecified: Secondary | ICD-10-CM

## 2024-03-13 DIAGNOSIS — Z79899 Other long term (current) drug therapy: Secondary | ICD-10-CM | POA: Diagnosis not present

## 2024-03-13 DIAGNOSIS — C787 Secondary malignant neoplasm of liver and intrahepatic bile duct: Secondary | ICD-10-CM

## 2024-03-13 DIAGNOSIS — Z87891 Personal history of nicotine dependence: Secondary | ICD-10-CM | POA: Diagnosis not present

## 2024-03-13 DIAGNOSIS — T451X5A Adverse effect of antineoplastic and immunosuppressive drugs, initial encounter: Secondary | ICD-10-CM | POA: Diagnosis not present

## 2024-03-13 DIAGNOSIS — Z5111 Encounter for antineoplastic chemotherapy: Secondary | ICD-10-CM

## 2024-03-13 DIAGNOSIS — K8689 Other specified diseases of pancreas: Secondary | ICD-10-CM

## 2024-03-13 DIAGNOSIS — G620 Drug-induced polyneuropathy: Secondary | ICD-10-CM | POA: Diagnosis not present

## 2024-03-13 LAB — CBC WITH DIFFERENTIAL (CANCER CENTER ONLY)
Abs Immature Granulocytes: 0.02 K/uL (ref 0.00–0.07)
Basophils Absolute: 0 K/uL (ref 0.0–0.1)
Basophils Relative: 1 %
Eosinophils Absolute: 0.1 K/uL (ref 0.0–0.5)
Eosinophils Relative: 2 %
HCT: 36.4 % (ref 36.0–46.0)
Hemoglobin: 12.2 g/dL (ref 12.0–15.0)
Immature Granulocytes: 0 %
Lymphocytes Relative: 30 %
Lymphs Abs: 1.4 K/uL (ref 0.7–4.0)
MCH: 32.2 pg (ref 26.0–34.0)
MCHC: 33.5 g/dL (ref 30.0–36.0)
MCV: 96 fL (ref 80.0–100.0)
Monocytes Absolute: 0.5 K/uL (ref 0.1–1.0)
Monocytes Relative: 10 %
Neutro Abs: 2.6 K/uL (ref 1.7–7.7)
Neutrophils Relative %: 57 %
Platelet Count: 226 K/uL (ref 150–400)
RBC: 3.79 MIL/uL — ABNORMAL LOW (ref 3.87–5.11)
RDW: 13.8 % (ref 11.5–15.5)
WBC Count: 4.6 K/uL (ref 4.0–10.5)
nRBC: 0 % (ref 0.0–0.2)

## 2024-03-13 LAB — CMP (CANCER CENTER ONLY)
ALT: 36 U/L (ref 0–44)
AST: 46 U/L — ABNORMAL HIGH (ref 15–41)
Albumin: 3.8 g/dL (ref 3.5–5.0)
Alkaline Phosphatase: 80 U/L (ref 38–126)
Anion gap: 6 (ref 5–15)
BUN: 11 mg/dL (ref 8–23)
CO2: 24 mmol/L (ref 22–32)
Calcium: 8.7 mg/dL — ABNORMAL LOW (ref 8.9–10.3)
Chloride: 109 mmol/L (ref 98–111)
Creatinine: 0.65 mg/dL (ref 0.44–1.00)
GFR, Estimated: >60 mL/min (ref >60)
Glucose, Bld: 71 mg/dL (ref 70–99)
Potassium: 4.1 mmol/L (ref 3.5–5.1)
Sodium: 139 mmol/L (ref 135–145)
Total Bilirubin: 0.7 mg/dL (ref 0.0–1.2)
Total Protein: 6.5 g/dL (ref 6.5–8.1)

## 2024-03-13 MED ORDER — PACLITAXEL PROTEIN-BOUND CHEMO INJECTION 100 MG
100.0000 mg/m2 | Freq: Once | INTRAVENOUS | Status: AC
  Administered 2024-03-13: 175 mg via INTRAVENOUS
  Filled 2024-03-13: qty 35

## 2024-03-13 MED ORDER — SODIUM CHLORIDE 0.9 % IV SOLN
1000.0000 mg/m2 | Freq: Once | INTRAVENOUS | Status: AC
  Administered 2024-03-13: 1710 mg via INTRAVENOUS
  Filled 2024-03-13: qty 44.97

## 2024-03-13 MED ORDER — SODIUM CHLORIDE 0.9 % IV SOLN
INTRAVENOUS | Status: DC
  Filled 2024-03-13: qty 250

## 2024-03-13 NOTE — Patient Instructions (Signed)
 [facility] - A DEPT OF [facility]  Discharge Instructions: Thank you for choosing [facility] to provide your oncology and hematology care.  If you have a lab appointment with the Cancer Center, please go directly to the Cancer Center and check in at the registration area.  Wear comfortable clothing and clothing appropriate for easy access to any Portacath or PICC line.   We strive to give you quality time with your provider. You may need to reschedule your appointment if you arrive late (15 or more minutes).  Arriving late affects you and other patients whose appointments are after yours.  Also, if you miss three or more appointments without notifying the office, you may be dismissed from the clinic at the provider's discretion.      For prescription refill requests, have your pharmacy contact our office and allow 72 hours for refills to be completed.    Today you received the following chemotherapy and/or immunotherapy agents: PACLitaxel -protein / gemcitabine    To help prevent nausea and vomiting after your treatment, we encourage you to take your nausea medication as directed.  BELOW ARE SYMPTOMS THAT SHOULD BE REPORTED IMMEDIATELY: *FEVER GREATER THAN 100.4 F (38 C) OR HIGHER *CHILLS OR SWEATING *NAUSEA AND VOMITING THAT IS NOT CONTROLLED WITH YOUR NAUSEA MEDICATION *UNUSUAL SHORTNESS OF BREATH *UNUSUAL BRUISING OR BLEEDING *URINARY PROBLEMS (pain or burning when urinating, or frequent urination) *BOWEL PROBLEMS (unusual diarrhea, constipation, pain near the anus) TENDERNESS IN MOUTH AND THROAT WITH OR WITHOUT PRESENCE OF ULCERS (sore throat, sores in mouth, or a toothache) UNUSUAL RASH, SWELLING OR PAIN  UNUSUAL VAGINAL DISCHARGE OR ITCHING   Items with * indicate a potential emergency and should be followed up as soon as possible or go to the Emergency Department if any problems should occur.  Please show the CHEMOTHERAPY ALERT  CARD or IMMUNOTHERAPY ALERT CARD at check-in to the Emergency Department and triage nurse.  Should you have questions after your visit or need to cancel or reschedule your appointment, please contact [facility] - A DEPT OF [facility]  [phone] and follow the prompts.  Office hours are 8:00 a.m. to 4:30 p.m. Monday - Friday. Please note that voicemails left after 4:00 p.m. may not be returned until the following business day.  We are closed weekends and major holidays. You have access to a nurse at all times for urgent questions. Please call the main number to the clinic [phone] and follow the prompts.  For any non-urgent questions, you may also contact your provider using MyChart. We now offer e-Visits for anyone 41 and older to request care online for non-urgent symptoms. For details visit [email or web address].   Also download the MyChart app! Go to the app store, search MyChart, open the app, select [facility], and log in with your MyChart username and password.

## 2024-03-13 NOTE — Assessment & Plan Note (Signed)
 S/p  Whipple procedure..  Continue Creon , continue 24000 units TID with meals and  with her snacks

## 2024-03-13 NOTE — Progress Notes (Signed)
 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  Pathology proved liver metastatic disease- additional chemotherapy --> whipple procedure.ypT1b ypN0--> adjuvant Gem/Abraxane  finished in August 2023--> Sept 2024 CT showed progression in lung --> Nov 2024 Wedge biopsy- Recurrent pancreatic cancer with lung metastasis.  Currently on Gemcitabine  and Abraxane  1 week on 1 week off schedule due to neutropenia.  Labs are reviewed and discussed with patient.  July CT - stable--> chemo break Signatera circulating tumor DNA testing- negative.  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  She did not tolerate Cymbalta .   Stable symptoms She gets acupuncture sessions which help her symptoms.   Encounter for antineoplastic chemotherapy Chemotherapy plan as listed above  Hypocalcemia Likely due to malabsorption. Continue calcium  supplementation, 2000 mg daily-divided in 2-3 doses IV calcium  gluconate 2g if calcium  <=8.5 Normal vitamin D  level   Pancreatic insufficiency S/p  Whipple procedure..  Continue Creon , continue 24000 units TID with meals and  with her snacks    Orders Placed This Encounter  Procedures   CT CHEST ABDOMEN PELVIS W CONTRAST    Standing Status:   Future    Expected Date:   04/02/2024    Expiration Date:   03/13/2025    If indicated for the ordered  procedure, I authorize the administration of contrast media per Radiology protocol:   Yes    Does the patient have a contrast media/X-ray dye allergy?:   No    Preferred imaging location?:   [facility]    If indicated for the ordered procedure, I authorize the administration of oral contrast media per Radiology protocol:   Yes   Cancer antigen 19-9    Standing Status:   Future    Expected Date:   04/10/2024    Expiration Date:   04/10/2025   CEA    Standing Status:   Future    Expected Date:   04/10/2024    Expiration Date:   04/10/2025   CBC with Differential (Cancer Center Only)    Standing Status:   Future    Expected Date:   04/10/2024    Expiration Date:   04/10/2025   CMP (Cancer Center only)    Standing Status:   Future    Expected Date:   04/10/2024    Expiration Date:   04/10/2025   CBC with Differential (Cancer Center Only)    Standing Status:   Future    Expected Date:   05/01/2024    Expiration Date:   05/01/2025   CMP (Cancer Center only)    Standing Status:   Future    Expected Date:   05/01/2024    Expiration Date:   05/01/2025    Follow up  2 weeks lab MD Gemcitaibine Abraxane   All questions were answered. The patient knows to call the clinic with any problems, questions or concerns.  Call Babara, MD, PhD [facility] Hematology Oncology 03/13/2024   Babara Call, MD    HISTORY OF PRESENTING ILLNESS:  Cassandra [name] is a  68 y.o.  female with presents for follow up of Stage IV pancreatic adenocarcinoma Patient initially presented with jaundice, transaminitis, bilirubin was 9.9.  CA 19-9 was 1874.  Patient also reports unintentional weight loss. 02/08/2020 MRI abdomen and MRCP with and without contrast was done at [facility] which showed pancreatic head mass measuring up to 3 cm, with marked associated narrowing of the portal vein confluence.  SMA is preserved.  Marked intrahepatic and extrahepatic biliary duct dilatation as well as mild dilatation of the main pancreatic  duct. Multiple hepatic masses highly concerning for metastatic disease.  Patient underwent EUS on 02/19/2020, which showed irregular mass identified in the pancreatic head, hypoechoic, measured 58mmx33mm, sonographic evidence concerning for invasion into the superior mesenteric artery.  There is no sign of significant abnormality in the main pancreatic duct.  Dilatation of common bile duct which measured up to 16 mm.  Region of celiac artery was visualized and showed no signs of significant abnormality.  No lymphadenopathy.  FNA showed adenocarcinoma.  02/19/2020, ERCP, malignant.  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  The stricture was treated with placement of wall flex metal stent.  Patient was seen by [facility] oncology Dr. [name] and was recommended for 3 drug regimen FOLFIRINOX.  Patient prefers to do chemotherapy locally at [facility].  Patient was referred to establish care today. She denies any pain.  Since stent placement, skin jaundice has improved.  Itchiness has also improved. Patient was accompanied by her husband today.  She has a family history of breast cancer in sister and paternal aunt, colon cancer paternal grandmother.  #No reportable targetable mutation on NGS 9/14/2021cycle 1 FOLFIRINOX.  Patient received oxaliplatin  and about 50% of Irinotecan  on day 1 and had experienced neurologic symptoms.  She went to ER and working diagnosis is TIA, and eventually I think this is due to irinotecan  side effects -irinotecan -associated dysarthria, lip/tongue numbness.  Adjustment was made for Irinotecan  to be  infused over 180 minutes.  Atropine  0.5 mg once prior to the irinotecan . No recurrent symptoms.   # 03/19/2020-03/21/2020 patient was admitted due to sepsis with strep pneumonia bacteremia.  Patient was treated with IV Rocephin .  TEE was done which showed no vegetation.  No PFO or ASD.  Patient was discharged home and he finished  full course of 14 days of IV Rocephin  on 04/02/2020 per ID recommendation.  Repeat blood culture was also negative.  08/25/20 cycle 10 FOLFIRINOX 09/08/20- present  Starting cycle 11, FOLFIRI, Oxaliplatin  discontinued due to neuropathy   #NGS showed no reportable targetable mutation #Genetic testing-[employer] diagnostic testing showed no pathological variants identified. MS stable, TMB 0mut/mb, KRAS G12D, SF3B1 K700E, TP53 V249fs*30  #01/14/2021  CT done at [facility] during the interval, stable disease.  # Her case was presented by Dr.Jia from [facility] tumor board. [name] were 4~5 liver lesions on OSH MRI last year at the time of diagnosis highly suspicious for metastasis. She is not eligible for surgical protocol for metastasis resection given the number of liver metastasis is >3. However given her excellent and durable response to chemo, surgical resection may be considered pending sustained disease control at 1 year and may require partial hepatectomy first to see if there is viable tumor before proceeding to Whipple resection ]  Patient had a COVID-19 infection in September 2022.  03/23/2021 CT abdomen pelvis No significant change in the ill-defined pancreatic head mass or  associated biliary  ductal and pancreatic ductal dilation.  Slight interval enlargement of subcentimeter anterior peripancreatic  lymph node, nonspecific. Attention on follow-up per clinical protocol. Similar enlargement of the ascending thoracic aorta measuring 4.4 cm  03/23/2021 MRI abdomen w and wo  Increasing ill-defined hypoenhancement of the pancreatic head surrounding the common bile duct stent, in keeping with known pancreatic malignancy. Anterior peripancreatic lymph node is better evaluated on CT.  Unchanged position of a common bile duct stent with similar mild diffuse intrahepatic ductal dilation and pneumobilia. No evidence of metastatic disease in the abdomen or pelvis. Partially visualized cystic left adnexal lesion  measuring up to 3.9 cm.   04/10/2021, patient underwent liver resection at [facility], by Dr. Zani.   Pathology report from [facility] was reviewed. Segments 3 partial hepatectomy, negative for viable tumor. Section 5/6 partial hepatectomy part 1 and 2, microscopic foci of residual viable adenocarcinoma, morphologically consistent with history of pancreatic primary.  Parenchymal margin is uninvolved.  #Patient resumed on FOLFIRI on 04/28/2021. She got another cycle on 05/12/2021 #05/26/2021, patient did not get additional chemotherapy due to transaminitis.  Shared decision was made to stop FOLFIRI and switch to gemcitabine  Abraxane  treatments.  05/26/2021, patient developed transaminitis, AST 763, ALT 691, alkaline phosphatase 364.  Bilirubin 0.7 05/26/2021 stat ultrasound abdomen right upper quadrant showed interval development of 3.3 x 1.8 cm complex mass in the right hepatic lobe.  Increased extrahepatic ductal dilatation.  Concerning for CBD obstruction. 05/29/2021, MRI abdomen MRCP with and without contrast showed unchanged pancreatic head soft tissue.  Severe intra and extrahepatic biliary ductal dilatation.  Common bile duct stents remain in position however patency is not established.  No evidence of lymphadenopathy or metastatic disease in the abdomen.  With further communication with radiologist, addendum was added that there is internal fluid signal and no appreciable associated contrast enhancement measuring 3.4 x 2.3 cm.  This appearance is generally not consistent with metastasis and is of uncertain significance.  Possibly reflecting hepatic abscess or residual of subcapsular hematoma.  Patient was seen by [facility] Dr. Oneal team.  He had ERCP done on 06/05/2021, with findings of One stent from the biliary tree was seen in the major papilla. The stent had migrated significantly into the duodenum. This is the cause of stent malfunction. One stent was removed from the biliary tree. Prior biliary  sphincterotomy appeared open. - A single severe biliary stricture was found in the lower third of the main bile duct with upstream dilation. The stricture was alignant appearing.- The biliary tree was swept and sludge was found.- One fully covered metal stent was placed into the common bile duct across the stricture.- No pancreatogram performed.  06/12/2021-08/10/2021 gemcitabine  and Abraxane .   10/14/2021, patient underwent Whipple procedure. Liver biopsy negative for malignancy. Review procedure showed invasive adenocarcinoma, moderately differentiated, centered in pancreatic head and confined in the Pancreas. Surgical margin is negative for malignancy.  36 lymph nodes were all negative for malignancy.  1 hepatic artery lymph node was harvested and was negative.  Gallbladder negative for malignancy.  Cystic duct excision negative for malignancy. pT1b pN0  10/25/2021, CT abdomen pelvis with contrast showed rim-enhancing fluid collection in the region of the hepatic hilum. Patient had a readmission for superficial wound infection.  She completed antibiotics on 11/03/2021.  11/03/2021, CT abdomen pelvis with contrast showed near resolution of previously seen fluid collection in the region of the hepatic hilum.  Similar appearance of the area of hypoenhancement in the liver.  Attention on follow-up.  11/17/2021, resumed  on gemcitabine /Abraxane . 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  12/12/ 2023 CT scan at [facility] shows no evidence of cancer recurrence.-NED 09/21/2022 CT chest abdomen pelvis w contrast at [facility] showed-No evidence of metastatic disease in the chest, abdomen, or pelvis. 12/21/2022 CT chest abdomen pelvis w contrast at [facility] showed-No evidence of metastatic disease in the chest, abdomen, or pelvis.   03/22/2023 CT scan at [facility] showed enlarging lung nodules. RLL 1cm, LLL 0.5cm, RUL 0.5cm Stable hypoattenuating right inferior hepatic lobe lesion measuring 0.9 cm.  Similar left adnexal  cystic lesion measuring up to 5.0 cm.   04/06/2023 s/p lung nodule biopsy.  Rare atypical epithelioid cells present with evidence suggestive of invasion, suspicious but not diagnostic for malignancy.   05/20/2023: Right robot assisted thoracoscopic wedge resection with MLND (Dr. Murlean)   Pathology:  Suspicious for malignancy but not diagnostic A. Lymph node, level 8, biopsy: Metastatic carcinoma involving one lymph node (1/1).  B. Lung, right lower lobe, wedge resection: Lung tissue positive for adenocarcinoma, consistent with metastasis from pancreas primary. See comment. Tumor size: 1.0 cm. Operative margin: Negative  Comment: Note is made of the patient's history of invasive adenocarcinoma of the pancreatic head ([id number]; 10/14/2021), which is compared to the current case. Comparison is challenging given the post-treatment nature of the pancreatic tumor; however, there are some morphologic similarities.  Although often unhelpful in the setting of lung primary versus pancreatic primary, immunohistochemical stains were performed. Tumor cells demonstrate the below immunoprofile which would could support pulmonary adenocarcinoma with enteric differentiation but would also be consistent with metastasis from the patient's known pancreatic primary; the latter is favored.              Positive                             Negative CK7 (strong, diffuse) TTF-1  CK20 (strong, focal)                    Positive and negative controls perform appropriately.    C. Lymph node, level 7, biopsy: Metastatic carcinoma involving one lymph node (1/1).  D. Lymph node, level 4R, biopsy: One lymph node, negative for malignancy (0/1).   INTERVAL HISTORY Cassandra [name] is a 68 y.o. female who has above history reviewed by me today presents for stage IV pancreatic cancer.   She tolerates Gemcitabine  Abraxane  1 week on and 1 week off, overall she tolerates well.  + chronic neuropathy /numbness on her toes, worse  at night, better after putting on her shoes.  She gets acupuncture session which helped her symptoms. Recently neuropathy got worse after having to wear boot after spraining left ankle. Seen by [facility]. Also had a fall and landed on right hip. She takes Tylenol  PRN for right hip pain.  She takes calcium  supplementation as instructed.  She take Creon  for pancreatic insufficiency  + fatigue after chemotherapy + mild stuffy nose, no fever, chills, sore throat.    Review of Systems  Constitutional:  Positive for fatigue. Negative for appetite change, chills, fever and unexpected weight change.  HENT:   Negative for hearing loss and voice change.   Eyes:  Negative for eye problems.  Respiratory:  Negative for chest tightness and cough.   Cardiovascular:  Negative for chest pain.  Gastrointestinal:  Negative for abdominal distention, abdominal pain, blood in stool and nausea.  Endocrine: Negative for hot flashes.  Genitourinary:  Negative for difficulty urinating and frequency.   Musculoskeletal:  Negative for arthralgias.  Skin:  Negative for itching and rash.  Neurological:  Positive for numbness. Negative for extremity weakness.  Hematological:  Negative for adenopathy.  Psychiatric/Behavioral:  Negative for confusion.     MEDICAL HISTORY:  Past Medical History:  Diagnosis Date   Allergy    Anemia    Bacteremia due to Streptococcus pneumoniae 03/19/2020   Cancer [facility])    pancreatic cancer   Colon polyps    Family history of breast cancer    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   Neutropenia (HCC) 04/18/2020   Osteopenia after menopause 05/2017   femoral neck T score -2.0   Personal history of chemotherapy    current for pancreatic ca   PONV (postoperative nausea and vomiting)    Pure hypercholesterolemia    Sepsis (HCC) 03/19/2020    SURGICAL HISTORY: Past Surgical History:  Procedure Laterality Date   COLONOSCOPY  07/2015   WNL   COLONOSCOPY  2008/2011    COLONOSCOPY WITH PROPOFOL  N/A 07/10/2021   Procedure: COLONOSCOPY WITH PROPOFOL ;  Surgeon: Maryruth Ole DASEN, MD;  Location: [facility] ENDOSCOPY;  Service: Endoscopy;  Laterality: N/A;   IR CV LINE INJECTION  04/28/2021   OVARIAN CYST REMOVAL  1992   dermoid-Dr CAK   PORTA CATH INSERTION N/A 03/07/2020   Procedure: PORTA CATH INSERTION;  Surgeon: Marea Selinda RAMAN, MD;  Location: [facility] INVASIVE CV LAB;  Service: Cardiovascular;  Laterality: N/A;   TEE WITHOUT CARDIOVERSION N/A 03/21/2020   Procedure: TRANSESOPHAGEAL ECHOCARDIOGRAM (TEE);  Surgeon: Fernand Denyse LABOR, MD;  Location: [facility] ORS;  Service: Cardiovascular;  Laterality: N/A;   TUBAL LIGATION  1993    SOCIAL HISTORY: Social History   Socioeconomic History   Marital status: Married    Spouse name: Not on file   Number of children: 2   Years of education: Not on file   Highest education level: Bachelor's degree (e.g., BA, AB, BS)  Occupational History   Occupation: Runner, broadcasting/film/video    Comment: retired   Occupation: Visual merchandiser  Tobacco Use   Smoking status: Former    Current packs/day: 0.00    Average packs/day: 1 pack/day for 10.0 years (10.0 ttl pk-yrs)    Types: Cigarettes    Start date: 06/29/1975    Quit date: 06/28/1985    Years since quitting: 38.7   Smokeless tobacco: Never   Tobacco comments:    Quit smoking 1987  Vaping Use   Vaping status: Never Used  Substance and Sexual Activity   Alcohol use: Yes    Alcohol/week: 5.0 standard drinks of alcohol    Types: 5 Glasses of wine per week    Comment: 0-2 mixed drinks a day   Drug use: No   Sexual activity: Not Currently    Birth control/protection: Post-menopausal  Other Topics Concern   Not on file  Social History Narrative   Not on file   Social Drivers of Health   Financial Resource Strain: Low Risk  (10/18/2023)   Overall Financial Resource Strain (CARDIA)    Difficulty of Paying Living Expenses: Not hard at all  Food Insecurity: No Food Insecurity (10/18/2023)   Hunger Vital  Sign    Worried About Running Out of Food in the Last Year: Never true    Ran Out of Food in the Last Year: Never true  Transportation Needs: No Transportation Needs (10/18/2023)   PRAPARE - Transportation    Lack of  Transportation (Medical): No    Lack of Transportation (Non-Medical): No  Physical Activity: Sufficiently Active (10/18/2023)   Exercise Vital Sign    Days of Exercise per Week: 6 days    Minutes of Exercise per Session: 30 min  Stress: No Stress Concern Present (10/18/2023)   [employer] of Occupational Health - Occupational Stress Questionnaire    Feeling of Stress : Not at all  Social Connections: Moderately Integrated (10/18/2023)   Social Connection and Isolation Panel    Frequency of Communication with Friends and Family: More than three times a week    Frequency of Social Gatherings with Friends and Family: More than three times a week    Attends Religious Services: More than 4 times per year    Active Member of [employer] or Organizations: No    Attends Banker Meetings: Never    Marital Status: Married  Catering manager Violence: Not At Risk (10/18/2023)   Humiliation, Afraid, Rape, and Kick questionnaire    Fear of Current or Ex-Partner: No    Emotionally Abused: No    Physically Abused: No    Sexually Abused: No    FAMILY HISTORY: Family History  Problem Relation Age of Onset   Breast cancer Paternal Aunt 22   Diabetes Mother    Osteoporosis Mother    Hyperlipidemia Father    Rheumatic fever Father    Valvular heart disease Father    Cancer Paternal Grandmother        possible colon   Breast cancer Sister 75    ALLERGIES:  is allergic to penicillin g.  MEDICATIONS:  Current Outpatient Medications  Medication Sig Dispense Refill   aspirin  81 MG chewable tablet Chew 81 mg by mouth daily.     b complex vitamins capsule Take 1 capsule by mouth daily.     Calcium  Carbonate-Vit D-Min (CALCIUM  1200 PO) Take by mouth.     Cholecalciferol  25  MCG (1000 UT) tablet Take 2,000 Units by mouth daily.     clobetasol  cream (TEMOVATE ) 0.05 %      fluticasone (FLONASE) 50 MCG/ACT nasal spray Place 1 spray into both nostrils daily.     lidocaine -prilocaine  (EMLA ) cream Apply small amount to port and cover with saran wrap 1-2 hours prior to port access 30 g 6   loratadine (CLARITIN REDITABS) 10 MG dissolvable tablet Take 10 mg by mouth daily.     Melatonin 2.5 MG CHEW Chew by mouth.     Multiple Vitamins-Minerals (MULTIVITAMIN WITH MINERALS) tablet Take 1 tablet by mouth daily.     ondansetron  (ZOFRAN -ODT) 8 MG disintegrating tablet Take 1 tablet (8 mg total) by mouth every 8 (eight) hours as needed for nausea or vomiting. 60 tablet 2   Pancrelipase , Lip-Prot-Amyl, (CREON ) 24000-76000 units CPEP TAKE 1 CAPSULE THREE TIMES DAILY BEFORE MEALS 200 capsule 3   prochlorperazine  (COMPAZINE ) 10 MG tablet Take 10 mg by mouth every 6 (six) hours as needed for nausea or vomiting.     simethicone  (GAS-X) 80 MG chewable tablet Chew 1 tablet (80 mg total) by mouth every 8 (eight) hours as needed for flatulence. 60 tablet 0   senna-docusate (SENOKOT-S) 8.6-50 MG tablet Take by mouth. (Patient not taking: Reported on 03/13/2024)     No current facility-administered medications for this visit.   Facility-Administered Medications Ordered in Other Visits  Medication Dose Route Frequency Provider Last Rate Last Admin   0.9 %  sodium chloride  infusion   Intravenous Continuous Babara Call, MD 10 mL/hr  at 09/12/23 1241 Infusion Verify at 09/12/23 1241   0.9 %  sodium chloride  infusion   Intravenous Continuous Babara Call, MD 10 mL/hr at 03/13/24 0947 New Bag at 03/13/24 0947   calcium  gluconate 2 g in sodium chloride  0.9 % 100 mL IVPB  2 g Intravenous PRN Babara Call, MD   Stopped at 09/12/23 1101   heparin  lock flush 100 UNIT/ML injection            prochlorperazine  (COMPAZINE ) 10 MG tablet            sodium chloride  flush (NS) 0.9 % injection 10 mL  10 mL Intravenous PRN  Babara Call, MD   10 mL at 02/18/21 0858   sodium chloride  flush (NS) 0.9 % injection 10 mL  10 mL Intravenous Once Borders, Joshua R, NP       sodium chloride  flush (NS) 0.9 % injection 10 mL  10 mL Intravenous Once Babara Call, MD         PHYSICAL EXAMINATION: ECOG PERFORMANCE STATUS: 0 - Asymptomatic Vitals:   03/13/24 0851  BP: (!) 142/77  Pulse: 63  Resp: 18  Temp: 98.6 F (37 C)  SpO2: 99%   Filed Weights   03/13/24 0851  Weight: 136 lb 14.4 oz (62.1 kg)     Physical Exam Constitutional:      General: She is not in acute distress. HENT:     Head: Normocephalic and atraumatic.  Eyes:     General: No scleral icterus. Cardiovascular:     Rate and Rhythm: Normal rate and regular rhythm.     Heart sounds: Normal heart sounds.  Pulmonary:     Effort: Pulmonary effort is normal. No respiratory distress.  Abdominal:     General: Bowel sounds are normal. There is no distension.     Palpations: Abdomen is soft.  Musculoskeletal:        General: No deformity. Normal range of motion.     Cervical back: Normal range of motion and neck supple.  Skin:    General: Skin is warm.     Coloration: Skin is not jaundiced.     Findings: No erythema.  Neurological:     Mental Status: She is alert and oriented to person, place, and time. Mental status is at baseline.  Psychiatric:        Mood and Affect: Mood normal.     LABORATORY DATA:  I have reviewed the data as listed    Latest Ref Rng & Units 03/13/2024    8:38 AM 02/23/2024    9:05 AM 02/09/2024    8:52 AM  CBC  WBC 4.0 - 10.5 K/uL 4.6  4.4  5.8   Hemoglobin 12.0 - 15.0 g/dL 87.7  87.8  87.4   Hematocrit 36.0 - 46.0 % 36.4  35.3  37.6   Platelets 150 - 400 K/uL 226  271  179       Latest Ref Rng & Units 03/13/2024    8:38 AM 02/23/2024    9:05 AM 02/09/2024    8:52 AM  CMP  Glucose 70 - 99 mg/dL 71  839  890   BUN 8 - 23 mg/dL 11  13  15    Creatinine 0.44 - 1.00 mg/dL 9.34  9.35  9.31   Sodium 135 - 145 mmol/L 139   137  137   Potassium 3.5 - 5.1 mmol/L 4.1  4.0  3.8   Chloride 98 - 111 mmol/L 109  106  108  CO2 22 - 32 mmol/L 24  23  22    Calcium  8.9 - 10.3 mg/dL 8.7  8.8  8.9   Total Protein 6.5 - 8.1 g/dL 6.5  6.4  6.5   Total Bilirubin 0.0 - 1.2 mg/dL 0.7  0.7  0.5   Alkaline Phos 38 - 126 U/L 80  83  75   AST 15 - 41 U/L 46  29  31   ALT 0 - 44 U/L 36  21  23      RADIOGRAPHIC STUDIES: I have personally reviewed the radiological images as listed and agreed with the findings in the report. Reviewed findings of MRI abdomen MRCP done at [facility]. MM 3D SCREENING MAMMOGRAM BILATERAL BREAST Result Date: 01/23/2024 CLINICAL DATA:  Screening. EXAM: DIGITAL SCREENING BILATERAL MAMMOGRAM WITH TOMOSYNTHESIS AND CAD TECHNIQUE: Bilateral screening digital craniocaudal and mediolateral oblique mammograms were obtained. Bilateral screening digital breast tomosynthesis was performed. The images were evaluated with computer-aided detection. COMPARISON:  Previous exam(s). ACR Breast Density Category c: The breasts are heterogeneously dense, which may obscure small masses. FINDINGS: There are no findings suspicious for malignancy. IMPRESSION: No mammographic evidence of malignancy. A result letter of this screening mammogram will be mailed directly to the patient. RECOMMENDATION: Screening mammogram in one year. (Code:SM-B-01Y) BI-RADS CATEGORY  1: Negative. Electronically Signed   By: Toribio Agreste M.D.   On: 01/23/2024 16:31   CT CHEST ABDOMEN PELVIS W CONTRAST Result Date: 01/02/2024 CLINICAL DATA:  Pancreatic cancer. Assess treatment response. * Tracking Code: BO * EXAM: CT CHEST, ABDOMEN, AND PELVIS WITH CONTRAST TECHNIQUE: Multidetector CT imaging of the chest, abdomen and pelvis was performed following the standard protocol during bolus administration of intravenous contrast. RADIATION DOSE REDUCTION: This exam was performed according to the departmental dose-optimization program which includes automated exposure  control, adjustment of the mA and/or kV according to patient size and/or use of iterative reconstruction technique. CONTRAST:  OMNIPAQUE  IOHEXOL  300 MG/ML  SOLN COMPARISON:  10/03/2023 FINDINGS: CT CHEST FINDINGS Cardiovascular: Ascending thoracic aorta measures 3.7 cm in coronal dimension which is within normal limits (image 29/series). Mediastinum/Nodes: Port in the anterior chest wall with tip in distal SVC. No axillary or supraclavicular adenopathy. No mediastinal or hilar adenopathy. No pericardial fluid. Esophagus normal. Lungs/Pleura: Several peripheral nodules in the LEFT lower lobe. Example 7 mm nodule image 80/4 compares to 7 mm remeasured. Adjacent small 5 mm nodule is also unchanged on image 75. Band like pleuroparenchymal thickening in the RIGHT lower lobe is unchanged. This is site of partial lung resection. 4 mm nodule in the RIGHT middle lobe unchanged. Other scattered nodules unchanged. No new pulmonary nodules are evident. Musculoskeletal: No aggressive osseous lesion. CT ABDOMEN AND PELVIS FINDINGS Hepatobiliary: No evidence of hepatic metastasis. Tiny hypodensity along the inferior RIGHT hepatic lobe again noted image 73/2. Postcholecystectomy choledocho urostomy without complication. Pancreas: Post Whipple procedure. Small amount of soft tissue previous described adjacent to to the SMA measures 10 mm 10 mm (image 76/series 2). No significant interval change. No peripancreatic adenopathy. Spleen: Normal spleen Adrenals/urinary tract: Adrenal glands and kidneys are normal. The ureters and bladder normal. Stomach/Bowel: Post partial gastrectomy related level procedure. Small bowel normal. Pancreas scratch the appendix normal. The colon and rectosigmoid colon are normal. Vascular/Lymphatic: Abdominal aorta is normal caliber. There is no retroperitoneal or periportal lymphadenopathy. No pelvic lymphadenopathy. Reproductive: Uterus normal. LEFT ovarian cyst again noted measuring 5.3 x 3.3 cm  compared to 5.1 x 3.1 cm. Other: No peritoneal or omental metastatic nodularity.  Musculoskeletal: No aggressive osseous lesion. IMPRESSION: CHEST: 1. Stable small pulmonary nodules. 2. No thoracic adenopathy. PELVIS: 1. Stable small volume soft tissue adjacent to the SMA. 2. No evidence of progressive metastatic disease in the abdomen pelvis. 3. Stable LEFT ovarian cyst. 4. Ascending thoracic aorta is within normal limits in diameter. Electronically Signed   By: Jackquline Boxer M.D.   On: 01/02/2024 10:01

## 2024-03-13 NOTE — Assessment & Plan Note (Signed)
 Chemotherapy plan as listed above

## 2024-03-13 NOTE — Assessment & Plan Note (Signed)
 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  Pathology proved liver metastatic disease- additional chemotherapy --> whipple procedure.ypT1b ypN0--> adjuvant Gem/Abraxane  finished in August 2023--> Sept 2024 CT showed progression in lung --> Nov 2024 Wedge biopsy- Recurrent pancreatic cancer with lung metastasis.  Currently on Gemcitabine  and Abraxane  1 week on 1 week off schedule due to neutropenia.  Labs are reviewed and discussed with patient.  July CT - stable--> chemo break Signatera circulating tumor DNA testing- negative.  Proceed with chemotherapy with Gemcitabine  and Abraxane  every other week

## 2024-03-13 NOTE — Progress Notes (Signed)
 Patient takes compazine  home med

## 2024-03-13 NOTE — Assessment & Plan Note (Signed)
 Mostly bothering her toes, grade 2.  She did not tolerate Cymbalta .   Stable symptoms She gets acupuncture sessions which help her symptoms.

## 2024-03-13 NOTE — Assessment & Plan Note (Signed)
 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 Continue calcium supplementation, 2000 mg daily-divided in 2-3 doses IV calcium gluconate 2g if calcium <=8.5 Normal vitamin D level

## 2024-03-14 LAB — CEA: CEA: 4.4 ng/mL (ref 0.0–4.7)

## 2024-03-14 LAB — CANCER ANTIGEN 19-9: CA 19-9: 27 U/mL (ref 0–35)

## 2024-03-15 ENCOUNTER — Encounter: Payer: Self-pay | Admitting: Oncology

## 2024-03-27 ENCOUNTER — Inpatient Hospital Stay

## 2024-03-27 ENCOUNTER — Inpatient Hospital Stay: Admitting: Oncology

## 2024-03-27 VITALS — BP 150/87 | HR 50

## 2024-03-27 VITALS — BP 133/83 | HR 62 | Temp 99.4°F | Wt 136.2 lb

## 2024-03-27 DIAGNOSIS — K8689 Other specified diseases of pancreas: Secondary | ICD-10-CM | POA: Diagnosis not present

## 2024-03-27 DIAGNOSIS — C259 Malignant neoplasm of pancreas, unspecified: Secondary | ICD-10-CM | POA: Diagnosis not present

## 2024-03-27 DIAGNOSIS — T451X5A Adverse effect of antineoplastic and immunosuppressive drugs, initial encounter: Secondary | ICD-10-CM

## 2024-03-27 DIAGNOSIS — Z5111 Encounter for antineoplastic chemotherapy: Secondary | ICD-10-CM

## 2024-03-27 DIAGNOSIS — G620 Drug-induced polyneuropathy: Secondary | ICD-10-CM | POA: Diagnosis not present

## 2024-03-27 LAB — CBC WITH DIFFERENTIAL (CANCER CENTER ONLY)
Abs Immature Granulocytes: 0.02 K/uL (ref 0.00–0.07)
Basophils Absolute: 0 K/uL (ref 0.0–0.1)
Basophils Relative: 1 %
Eosinophils Absolute: 0.1 K/uL (ref 0.0–0.5)
Eosinophils Relative: 2 %
HCT: 36.6 % (ref 36.0–46.0)
Hemoglobin: 12.2 g/dL (ref 12.0–15.0)
Immature Granulocytes: 1 %
Lymphocytes Relative: 27 %
Lymphs Abs: 1.2 K/uL (ref 0.7–4.0)
MCH: 31.9 pg (ref 26.0–34.0)
MCHC: 33.3 g/dL (ref 30.0–36.0)
MCV: 95.6 fL (ref 80.0–100.0)
Monocytes Absolute: 0.5 K/uL (ref 0.1–1.0)
Monocytes Relative: 11 %
Neutro Abs: 2.5 K/uL (ref 1.7–7.7)
Neutrophils Relative %: 58 %
Platelet Count: 163 K/uL (ref 150–400)
RBC: 3.83 MIL/uL — ABNORMAL LOW (ref 3.87–5.11)
RDW: 13.6 % (ref 11.5–15.5)
WBC Count: 4.3 K/uL (ref 4.0–10.5)
nRBC: 0 % (ref 0.0–0.2)

## 2024-03-27 LAB — CMP (CANCER CENTER ONLY)
ALT: 32 U/L (ref 0–44)
AST: 37 U/L (ref 15–41)
Albumin: 4 g/dL (ref 3.5–5.0)
Alkaline Phosphatase: 79 U/L (ref 38–126)
Anion gap: 9 (ref 5–15)
BUN: 14 mg/dL (ref 8–23)
CO2: 23 mmol/L (ref 22–32)
Calcium: 8.7 mg/dL — ABNORMAL LOW (ref 8.9–10.3)
Chloride: 107 mmol/L (ref 98–111)
Creatinine: 0.68 mg/dL (ref 0.44–1.00)
GFR, Estimated: >60 mL/min (ref >60)
Glucose, Bld: 139 mg/dL — ABNORMAL HIGH (ref 70–99)
Potassium: 3.8 mmol/L (ref 3.5–5.1)
Sodium: 139 mmol/L (ref 135–145)
Total Bilirubin: 0.8 mg/dL (ref 0.0–1.2)
Total Protein: 6.5 g/dL (ref 6.5–8.1)

## 2024-03-27 MED ORDER — PACLITAXEL PROTEIN-BOUND CHEMO INJECTION 100 MG
100.0000 mg/m2 | Freq: Once | INTRAVENOUS | Status: AC
  Administered 2024-03-27: 175 mg via INTRAVENOUS
  Filled 2024-03-27: qty 35

## 2024-03-27 MED ORDER — SODIUM CHLORIDE 0.9 % IV SOLN
INTRAVENOUS | Status: DC
  Filled 2024-03-27: qty 250

## 2024-03-27 MED ORDER — SODIUM CHLORIDE 0.9 % IV SOLN
1000.0000 mg/m2 | Freq: Once | INTRAVENOUS | Status: AC
  Administered 2024-03-27: 1710 mg via INTRAVENOUS
  Filled 2024-03-27: qty 44.97

## 2024-03-27 NOTE — Assessment & Plan Note (Signed)
 S/p  Whipple procedure..  Continue Creon , continue 24000 units TID with meals and  with her snacks

## 2024-03-27 NOTE — Assessment & Plan Note (Addendum)
 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  Pathology proved liver metastatic disease- additional chemotherapy --> whipple procedure.ypT1b ypN0--> adjuvant Gem/Abraxane  finished in August 2023--> Sept 2024 CT showed progression in lung --> Nov 2024 Wedge biopsy- Recurrent pancreatic cancer with lung metastasis.  Currently on Gemcitabine  and Abraxane  1 week on 1 week off schedule due to neutropenia.  Labs are reviewed and discussed with patient.  July CT - stable--> chemo break Signatera circulating tumor DNA testing- negative.  Proceed with chemotherapy with Gemcitabine  and Abraxane  every other week

## 2024-03-27 NOTE — Assessment & Plan Note (Signed)
 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 Continue calcium  supplementation, 2000 mg daily-divided in 2-3 doses IV calcium  gluconate 2g if calcium  <=8.5 Normal vitamin D  level Calcium  level has been stable

## 2024-03-27 NOTE — Assessment & Plan Note (Signed)
 Chemotherapy plan as listed above

## 2024-03-27 NOTE — Progress Notes (Signed)
 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  Pathology proved liver metastatic disease- additional chemotherapy --> whipple procedure.ypT1b ypN0--> adjuvant Gem/Abraxane  finished in August 2023--> Sept 2024 CT showed progression in lung --> Nov 2024 Wedge biopsy- Recurrent pancreatic cancer with lung metastasis.  Currently on Gemcitabine  and Abraxane  1 week on 1 week off schedule due to neutropenia.  Labs are reviewed and discussed with patient.  July CT - stable--> chemo break.  Signatera circulating tumor DNA testing- negative.  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  She did not tolerate Cymbalta .   Stable symptoms She gets acupuncture sessions which help her symptoms.   Encounter for antineoplastic chemotherapy Chemotherapy plan as listed above  Hypocalcemia Likely due to malabsorption. Continue calcium  supplementation, 2000 mg daily-divided in 2-3 doses IV calcium  gluconate 2g if calcium  <=8.5 Normal vitamin D  level Calcium  level has been stable  Pancreatic insufficiency S/p  Whipple procedure..  Continue Creon , continue 24000 units TID with meals and  with her snacks    Orders Placed This Encounter  Procedures   Cancer antigen 19-9    Standing Status:   Future    Expected Date:   05/15/2024    Expiration Date:   05/15/2025   CEA    Standing Status:    Future    Expected Date:   05/15/2024    Expiration Date:   05/15/2025   CBC with Differential (Cancer Center Only)    Standing Status:   Future    Expected Date:   05/15/2024    Expiration Date:   05/15/2025   CMP (Cancer Center only)    Standing Status:   Future    Expected Date:   05/15/2024    Expiration Date:   05/15/2025   CBC with Differential (Cancer Center Only)    Standing Status:   Future    Expected Date:   05/29/2024    Expiration Date:   05/29/2025   CMP (Cancer Center only)    Standing Status:   Future    Expected Date:   05/29/2024    Expiration Date:   05/29/2025    Follow up  2 weeks lab MD Gemcitaibine Abraxane   All questions were answered. The patient knows to call the clinic with any problems, questions or concerns.  Call Babara, MD, PhD [facility] Hematology Oncology 03/27/2024   Babara Call, MD    HISTORY OF PRESENTING ILLNESS:   Cassandra [name] is a  68 y.o.  female with presents for follow up of Stage IV pancreatic adenocarcinoma Patient initially presented with jaundice, transaminitis, bilirubin was 9.9.  CA 19-9 was 1874.  Patient also reports unintentional weight loss. 02/08/2020 MRI abdomen and MRCP with and without contrast was done at [facility] which showed pancreatic head mass measuring up to 3 cm, with marked associated narrowing of the portal vein confluence.  SMA is preserved.  Marked intrahepatic and extrahepatic biliary duct dilatation as well as mild dilatation of the main pancreatic duct. Multiple hepatic  masses highly concerning for metastatic disease.  Patient underwent EUS on 02/19/2020, which showed irregular mass identified in the pancreatic head, hypoechoic, measured 36mmx33mm, sonographic evidence concerning for invasion into the superior mesenteric artery.  There is no sign of significant abnormality in the main pancreatic duct.  Dilatation of common bile duct which measured up to 16 mm.  Region of celiac artery was visualized and showed no signs of  significant abnormality.  No lymphadenopathy.  FNA showed adenocarcinoma.  02/19/2020, ERCP, malignant.  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  The stricture was treated with placement of wall flex metal stent.  Patient was seen by [facility] oncology Dr. [name] and was recommended for 3 drug regimen FOLFIRINOX.  Patient prefers to do chemotherapy locally at [facility].  Patient was referred to establish care today. She denies any pain.  Since stent placement, skin jaundice has improved.  Itchiness has also improved. Patient was accompanied by her husband today.  She has a family history of breast cancer in sister and paternal aunt, colon cancer paternal grandmother.  #No reportable targetable mutation on NGS 9/14/2021cycle 1 FOLFIRINOX.  Patient received oxaliplatin  and about 50% of Irinotecan  on day 1 and had experienced neurologic symptoms.  She went to ER and working diagnosis is TIA, and eventually I think this is due to irinotecan  side effects -irinotecan -associated dysarthria, lip/tongue numbness.  Adjustment was made for Irinotecan  to be  infused over 180 minutes.  Atropine  0.5 mg once prior to the irinotecan . No recurrent symptoms.   # 03/19/2020-03/21/2020 patient was admitted due to sepsis with strep pneumonia bacteremia.  Patient was treated with IV Rocephin .  TEE was done which showed no vegetation.  No PFO or ASD.  Patient was discharged home and he finished full course of 14 days of IV Rocephin  on 04/02/2020 per ID recommendation.  Repeat blood culture was also negative.  08/25/20 cycle 10 FOLFIRINOX 09/08/20- present  Starting cycle 11, FOLFIRI, Oxaliplatin  discontinued due to neuropathy   #NGS showed no reportable targetable mutation #Genetic testing-[employer] diagnostic testing showed no pathological variants identified. MS stable, TMB 0mut/mb, KRAS G12D, SF3B1 K700E, TP53 V246fs*30  #01/14/2021  CT done at [facility] during the  interval, stable disease.  # Her case was presented by Dr.Jia from [facility] tumor board. [name] were 4~5 liver lesions on OSH MRI last year at the time of diagnosis highly suspicious for metastasis. She is not eligible for surgical protocol for metastasis resection given the number of liver metastasis is >3. However given her excellent and durable response to chemo, surgical resection may be considered pending sustained disease control at 1 year and may require partial hepatectomy first to see if there is viable tumor before proceeding to Whipple resection ]  Patient had a COVID-19 infection in September 2022.  03/23/2021 CT abdomen pelvis No significant change in the ill-defined pancreatic head mass or  associated biliary ductal and pancreatic ductal dilation.  Slight interval enlargement of subcentimeter anterior peripancreatic  lymph node, nonspecific. Attention on follow-up per clinical protocol. Similar enlargement of the ascending thoracic aorta measuring 4.4 cm  03/23/2021 MRI abdomen w and wo  Increasing ill-defined hypoenhancement of the pancreatic head surrounding the common bile duct stent, in keeping with known pancreatic malignancy. Anterior peripancreatic lymph node is better evaluated on CT.  Unchanged position of a common bile duct stent with similar mild diffuse intrahepatic ductal dilation and pneumobilia. No evidence of metastatic disease in the abdomen or pelvis.  Partially visualized cystic left adnexal lesion measuring up to 3.9 cm.   04/10/2021, patient underwent liver resection at [facility], by Dr. Zani.   Pathology report from [facility] was reviewed. Segments 3 partial hepatectomy, negative for viable tumor. Section 5/6 partial hepatectomy part 1 and 2, microscopic foci of residual viable adenocarcinoma, morphologically consistent with history of pancreatic primary.  Parenchymal margin is uninvolved.  #Patient resumed on FOLFIRI on 04/28/2021. She got another cycle on  05/12/2021 #05/26/2021, patient did not get additional chemotherapy due to transaminitis.  Shared decision was made to stop FOLFIRI and switch to gemcitabine  Abraxane  treatments.  05/26/2021, patient developed transaminitis, AST 763, ALT 691, alkaline phosphatase 364.  Bilirubin 0.7 05/26/2021 stat ultrasound abdomen right upper quadrant showed interval development of 3.3 x 1.8 cm complex mass in the right hepatic lobe.  Increased extrahepatic ductal dilatation.  Concerning for CBD obstruction. 05/29/2021, MRI abdomen MRCP with and without contrast showed unchanged pancreatic head soft tissue.  Severe intra and extrahepatic biliary ductal dilatation.  Common bile duct stents remain in position however patency is not established.  No evidence of lymphadenopathy or metastatic disease in the abdomen.  With further communication with radiologist, addendum was added that there is internal fluid signal and no appreciable associated contrast enhancement measuring 3.4 x 2.3 cm.  This appearance is generally not consistent with metastasis and is of uncertain significance.  Possibly reflecting hepatic abscess or residual of subcapsular hematoma.  Patient was seen by [facility] Dr. Oneal team.  He had ERCP done on 06/05/2021, with findings of One stent from the biliary tree was seen in the major papilla. The stent had migrated significantly into the duodenum. This is the cause of stent malfunction. One stent was removed from the biliary tree. Prior biliary sphincterotomy appeared open. - A single severe biliary stricture was found in the lower third of the main bile duct with upstream dilation. The stricture was alignant appearing.- The biliary tree was swept and sludge was found.- One fully covered metal stent was placed into the common bile duct across the stricture.- No pancreatogram performed.  06/12/2021-08/10/2021 gemcitabine  and Abraxane .   10/14/2021, patient underwent Whipple procedure. Liver biopsy negative for  malignancy. Review procedure showed invasive adenocarcinoma, moderately differentiated, centered in pancreatic head and confined in the Pancreas. Surgical margin is negative for malignancy.  36 lymph nodes were all negative for malignancy.  1 hepatic artery lymph node was harvested and was negative.  Gallbladder negative for malignancy.  Cystic duct excision negative for malignancy. pT1b pN0  10/25/2021, CT abdomen pelvis with contrast showed rim-enhancing fluid collection in the region of the hepatic hilum. Patient had a readmission for superficial wound infection.  She completed antibiotics on 11/03/2021.  11/03/2021, CT abdomen pelvis with contrast showed near resolution of previously seen fluid collection in the region of the hepatic hilum.  Similar appearance of the area of hypoenhancement in the liver.  Attention on follow-up.  11/17/2021, resumed on gemcitabine /Abraxane . 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  12/12/ 2023 CT scan [facility] shows no evidence of cancer recurrence.-NED 09/21/2022 CT chest abdomen pelvis w contrast at [facility] showed-No evidence of metastatic disease in the chest, abdomen, or pelvis. 12/21/2022 CT chest abdomen pelvis w contrast at [facility] showed-No evidence of metastatic disease in the chest, abdomen, or pelvis.   03/22/2023 CT scan at [facility] showed enlarging lung nodules. RLL 1cm, LLL 0.5cm, RUL 0.5cm Stable hypoattenuating right inferior hepatic lobe lesion measuring 0.9 cm.  Similar left adnexal cystic lesion measuring  up to 5.0 cm.   04/06/2023 s/p lung nodule biopsy.  Rare atypical epithelioid cells present with evidence suggestive of invasion, suspicious but not diagnostic for malignancy.   05/20/2023: Right robot assisted thoracoscopic wedge resection with MLND (Dr. Murlean)   Pathology:  Suspicious for malignancy but not diagnostic A. Lymph node, level 8, biopsy: Metastatic carcinoma involving one lymph node (1/1).  B. Lung, right lower lobe, wedge  resection: Lung tissue positive for adenocarcinoma, consistent with metastasis from pancreas primary. See comment. Tumor size: 1.0 cm. Operative margin: Negative  Comment: Note is made of the patient's history of invasive adenocarcinoma of the pancreatic head ([id number]; 10/14/2021), which is compared to the current case. Comparison is challenging given the post-treatment nature of the pancreatic tumor; however, there are some morphologic similarities.  Although often unhelpful in the setting of lung primary versus pancreatic primary, immunohistochemical stains were performed. Tumor cells demonstrate the below immunoprofile which would could support pulmonary adenocarcinoma with enteric differentiation but would also be consistent with metastasis from the patient's known pancreatic primary; the latter is favored.              Positive                             Negative CK7 (strong, diffuse) TTF-1  CK20 (strong, focal)                    Positive and negative controls perform appropriately.    C. Lymph node, level 7, biopsy: Metastatic carcinoma involving one lymph node (1/1).  D. Lymph node, level 4R, biopsy: One lymph node, negative for malignancy (0/1).   INTERVAL HISTORY Cassandra [name] is a 68 y.o. female who has above history reviewed by me today presents for stage IV pancreatic cancer.   She tolerates Gemcitabine  Abraxane  1 week on and 1 week off, overall she tolerates well.  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  She gets acupuncture session which helped her symptoms.  She takes calcium  supplementation as instructed.  She take Creon  for pancreatic insufficiency  + fatigue after chemotherapy + Right knee pain after recent fall.  She describes it at achiness at night.  No pain with walking.  She takes Tylenol  PM as needed.  She plans to go to [facility] for evaluation.   Review of Systems  Constitutional:  Positive for fatigue.  Negative for appetite change, chills, fever and unexpected weight change.  HENT:   Negative for hearing loss and voice change.   Eyes:  Negative for eye problems.  Respiratory:  Negative for chest tightness and cough.   Cardiovascular:  Negative for chest pain.  Gastrointestinal:  Negative for abdominal distention, abdominal pain, blood in stool and nausea.  Endocrine: Negative for hot flashes.  Genitourinary:  Negative for difficulty urinating and frequency.   Musculoskeletal:  Positive for arthralgias.  Skin:  Negative for itching and rash.  Neurological:  Positive for numbness. Negative for extremity weakness.  Hematological:  Negative for adenopathy.  Psychiatric/Behavioral:  Negative for confusion.     MEDICAL HISTORY:  Past Medical History:  Diagnosis Date   Allergy    Anemia    Bacteremia due to Streptococcus pneumoniae 03/19/2020   Cancer [facility])    pancreatic cancer   Colon polyps    Family history of breast cancer    Neuropathy due to drug    chemo induced  Neutropenia 04/18/2020   Osteopenia after menopause 05/2017   femoral neck T score -2.0   Personal history of chemotherapy    current for pancreatic ca   PONV (postoperative nausea and vomiting)    Pure hypercholesterolemia    Sepsis (HCC) 03/19/2020    SURGICAL HISTORY: Past Surgical History:  Procedure Laterality Date   COLONOSCOPY  07/2015   WNL   COLONOSCOPY  2008/2011   COLONOSCOPY WITH PROPOFOL  N/A 07/10/2021   Procedure: COLONOSCOPY WITH PROPOFOL ;  Surgeon: Maryruth Ole DASEN, MD;  Location: [facility] ENDOSCOPY;  Service: Endoscopy;  Laterality: N/A;   IR CV LINE INJECTION  04/28/2021   OVARIAN CYST REMOVAL  1992   dermoid-Dr CAK   PORTA CATH INSERTION N/A 03/07/2020   Procedure: PORTA CATH INSERTION;  Surgeon: Marea Selinda RAMAN, MD;  Location: [facility] INVASIVE CV LAB;  Service: Cardiovascular;  Laterality: N/A;   TEE WITHOUT CARDIOVERSION N/A 03/21/2020   Procedure: TRANSESOPHAGEAL ECHOCARDIOGRAM (TEE);   Surgeon: Fernand Denyse LABOR, MD;  Location: [facility] ORS;  Service: Cardiovascular;  Laterality: N/A;   TUBAL LIGATION  1993    SOCIAL HISTORY: Social History   Socioeconomic History   Marital status: Married    Spouse name: Not on file   Number of children: 2   Years of education: Not on file   Highest education level: Bachelor's degree (e.g., BA, AB, BS)  Occupational History   Occupation: Runner, broadcasting/film/video    Comment: retired   Occupation: Visual merchandiser  Tobacco Use   Smoking status: Former    Current packs/day: 0.00    Average packs/day: 1 pack/day for 10.0 years (10.0 ttl pk-yrs)    Types: Cigarettes    Start date: 06/29/1975    Quit date: 06/28/1985    Years since quitting: 38.7   Smokeless tobacco: Never   Tobacco comments:    Quit smoking 1987  Vaping Use   Vaping status: Never Used  Substance and Sexual Activity   Alcohol use: Yes    Alcohol/week: 5.0 standard drinks of alcohol    Types: 5 Glasses of wine per week    Comment: 0-2 mixed drinks a day   Drug use: No   Sexual activity: Not Currently    Birth control/protection: Post-menopausal  Other Topics Concern   Not on file  Social History Narrative   Not on file   Social Drivers of Health   Financial Resource Strain: Low Risk  (10/18/2023)   Overall Financial Resource Strain (CARDIA)    Difficulty of Paying Living Expenses: Not hard at all  Food Insecurity: No Food Insecurity (10/18/2023)   Hunger Vital Sign    Worried About Running Out of Food in the Last Year: Never true    Ran Out of Food in the Last Year: Never true  Transportation Needs: No Transportation Needs (10/18/2023)   PRAPARE - Administrator, Civil Service (Medical): No    Lack of Transportation (Non-Medical): No  Physical Activity: Sufficiently Active (10/18/2023)   Exercise Vital Sign    Days of Exercise per Week: 6 days    Minutes of Exercise per Session: 30 min  Stress: No Stress Concern Present (10/18/2023)   [employer] of Occupational  Health - Occupational Stress Questionnaire    Feeling of Stress : Not at all  Social Connections: Moderately Integrated (10/18/2023)   Social Connection and Isolation Panel    Frequency of Communication with Friends and Family: More than three times a week    Frequency of Social Gatherings with Friends and  Family: More than three times a week    Attends Religious Services: More than 4 times per year    Active Member of Clubs or Organizations: No    Attends Banker Meetings: Never    Marital Status: Married  Catering manager Violence: Not At Risk (10/18/2023)   Humiliation, Afraid, Rape, and Kick questionnaire    Fear of Current or Ex-Partner: No    Emotionally Abused: No    Physically Abused: No    Sexually Abused: No    FAMILY HISTORY: Family History  Problem Relation Age of Onset   Breast cancer Paternal Aunt 48   Diabetes Mother    Osteoporosis Mother    Hyperlipidemia Father    Rheumatic fever Father    Valvular heart disease Father    Cancer Paternal Grandmother        possible colon   Breast cancer Sister 40    ALLERGIES:  is allergic to penicillin g.  MEDICATIONS:  Current Outpatient Medications  Medication Sig Dispense Refill   aspirin  81 MG chewable tablet Chew 81 mg by mouth daily.     b complex vitamins capsule Take 1 capsule by mouth daily.     Calcium  Carbonate-Vit D-Min (CALCIUM  1200 PO) Take by mouth.     Cholecalciferol  25 MCG (1000 UT) tablet Take 2,000 Units by mouth daily.     clobetasol  cream (TEMOVATE ) 0.05 %      fluticasone (FLONASE) 50 MCG/ACT nasal spray Place 1 spray into both nostrils daily.     lidocaine -prilocaine  (EMLA ) cream Apply small amount to port and cover with saran wrap 1-2 hours prior to port access 30 g 6   loratadine (CLARITIN REDITABS) 10 MG dissolvable tablet Take 10 mg by mouth daily.     Melatonin 2.5 MG CHEW Chew by mouth.     Multiple Vitamins-Minerals (MULTIVITAMIN WITH MINERALS) tablet Take 1 tablet by mouth  daily.     ondansetron  (ZOFRAN -ODT) 8 MG disintegrating tablet Take 1 tablet (8 mg total) by mouth every 8 (eight) hours as needed for nausea or vomiting. 60 tablet 2   Pancrelipase , Lip-Prot-Amyl, (CREON ) 24000-76000 units CPEP TAKE 1 CAPSULE THREE TIMES DAILY BEFORE MEALS 200 capsule 3   prochlorperazine  (COMPAZINE ) 10 MG tablet Take 10 mg by mouth every 6 (six) hours as needed for nausea or vomiting.     simethicone  (GAS-X) 80 MG chewable tablet Chew 1 tablet (80 mg total) by mouth every 8 (eight) hours as needed for flatulence. 60 tablet 0   senna-docusate (SENOKOT-S) 8.6-50 MG tablet Take by mouth. (Patient not taking: Reported on 03/13/2024)     No current facility-administered medications for this visit.   Facility-Administered Medications Ordered in Other Visits  Medication Dose Route Frequency Provider Last Rate Last Admin   0.9 %  sodium chloride  infusion   Intravenous Continuous Babara Call, MD 10 mL/hr at 09/12/23 1241 Infusion Verify at 09/12/23 1241   calcium  gluconate 2 g in sodium chloride  0.9 % 100 mL IVPB  2 g Intravenous PRN Babara Call, MD   Stopped at 09/12/23 1101   heparin  lock flush 100 UNIT/ML injection            prochlorperazine  (COMPAZINE ) 10 MG tablet            sodium chloride  flush (NS) 0.9 % injection 10 mL  10 mL Intravenous PRN Babara Call, MD   10 mL at 02/18/21 0858   sodium chloride  flush (NS) 0.9 % injection 10 mL  10 mL  Intravenous Once Borders, Fonda SAUNDERS, NP       sodium chloride  flush (NS) 0.9 % injection 10 mL  10 mL Intravenous Once Babara Call, MD         PHYSICAL EXAMINATION: ECOG PERFORMANCE STATUS: 0 - Asymptomatic Vitals:   03/27/24 0858  BP: 133/83  Pulse: 62  Temp: 99.4 F (37.4 C)  SpO2: 99%   Filed Weights   03/27/24 0858  Weight: 136 lb 3.2 oz (61.8 kg)     Physical Exam Constitutional:      General: She is not in acute distress. HENT:     Head: Normocephalic and atraumatic.  Eyes:     General: No scleral icterus. Cardiovascular:      Rate and Rhythm: Normal rate and regular rhythm.     Heart sounds: Normal heart sounds.  Pulmonary:     Effort: Pulmonary effort is normal. No respiratory distress.  Abdominal:     General: Bowel sounds are normal. There is no distension.     Palpations: Abdomen is soft.  Musculoskeletal:        General: No deformity. Normal range of motion.     Cervical back: Normal range of motion and neck supple.  Skin:    General: Skin is warm.     Coloration: Skin is not jaundiced.     Findings: No erythema.  Neurological:     Mental Status: She is alert and oriented to person, place, and time. Mental status is at baseline.  Psychiatric:        Mood and Affect: Mood normal.     LABORATORY DATA:  I have reviewed the data as listed    Latest Ref Rng & Units 03/27/2024    8:33 AM 03/13/2024    8:38 AM 02/23/2024    9:05 AM  CBC  WBC 4.0 - 10.5 K/uL 4.3  4.6  4.4   Hemoglobin 12.0 - 15.0 g/dL 87.7  87.7  87.8   Hematocrit 36.0 - 46.0 % 36.6  36.4  35.3   Platelets 150 - 400 K/uL 163  226  271       Latest Ref Rng & Units 03/27/2024    8:33 AM 03/13/2024    8:38 AM 02/23/2024    9:05 AM  CMP  Glucose 70 - 99 mg/dL 860  71  839   BUN 8 - 23 mg/dL 14  11  13    Creatinine 0.44 - 1.00 mg/dL 9.31  9.34  9.35   Sodium 135 - 145 mmol/L 139  139  137   Potassium 3.5 - 5.1 mmol/L 3.8  4.1  4.0   Chloride 98 - 111 mmol/L 107  109  106   CO2 22 - 32 mmol/L 23  24  23    Calcium  8.9 - 10.3 mg/dL 8.7  8.7  8.8   Total Protein 6.5 - 8.1 g/dL 6.5  6.5  6.4   Total Bilirubin 0.0 - 1.2 mg/dL 0.8  0.7  0.7   Alkaline Phos 38 - 126 U/L 79  80  83   AST 15 - 41 U/L 37  46  29   ALT 0 - 44 U/L 32  36  21      RADIOGRAPHIC STUDIES: I have personally reviewed the radiological images as listed and agreed with the findings in the report. Reviewed findings of MRI abdomen MRCP done at [facility]. MM 3D SCREENING MAMMOGRAM BILATERAL BREAST Result Date: 01/23/2024 CLINICAL DATA:  Screening. EXAM: DIGITAL SCREENING  BILATERAL MAMMOGRAM  WITH TOMOSYNTHESIS AND CAD TECHNIQUE: Bilateral screening digital craniocaudal and mediolateral oblique mammograms were obtained. Bilateral screening digital breast tomosynthesis was performed. The images were evaluated with computer-aided detection. COMPARISON:  Previous exam(s). ACR Breast Density Category c: The breasts are heterogeneously dense, which may obscure small masses. FINDINGS: There are no findings suspicious for malignancy. IMPRESSION: No mammographic evidence of malignancy. A result letter of this screening mammogram will be mailed directly to the patient. RECOMMENDATION: Screening mammogram in one year. (Code:[id number]) BI-RADS CATEGORY  1: Negative. Electronically Signed   By: Toribio Agreste M.D.   On: 01/23/2024 16:31   CT CHEST ABDOMEN PELVIS W CONTRAST Result Date: 01/02/2024 CLINICAL DATA:  Pancreatic cancer. Assess treatment response. * Tracking Code: BO * EXAM: CT CHEST, ABDOMEN, AND PELVIS WITH CONTRAST TECHNIQUE: Multidetector CT imaging of the chest, abdomen and pelvis was performed following the standard protocol during bolus administration of intravenous contrast. RADIATION DOSE REDUCTION: This exam was performed according to the departmental dose-optimization program which includes automated exposure control, adjustment of the mA and/or kV according to patient size and/or use of iterative reconstruction technique. CONTRAST:  OMNIPAQUE  IOHEXOL  300 MG/ML  SOLN COMPARISON:  10/03/2023 FINDINGS: CT CHEST FINDINGS Cardiovascular: Ascending thoracic aorta measures 3.7 cm in coronal dimension which is within normal limits (image 29/series). Mediastinum/Nodes: Port in the anterior chest wall with tip in distal SVC. No axillary or supraclavicular adenopathy. No mediastinal or hilar adenopathy. No pericardial fluid. Esophagus normal. Lungs/Pleura: Several peripheral nodules in the LEFT lower lobe. Example 7 mm nodule image 80/4 compares to 7 mm remeasured. Adjacent small  5 mm nodule is also unchanged on image 75. Band like pleuroparenchymal thickening in the RIGHT lower lobe is unchanged. This is site of partial lung resection. 4 mm nodule in the RIGHT middle lobe unchanged. Other scattered nodules unchanged. No new pulmonary nodules are evident. Musculoskeletal: No aggressive osseous lesion. CT ABDOMEN AND PELVIS FINDINGS Hepatobiliary: No evidence of hepatic metastasis. Tiny hypodensity along the inferior RIGHT hepatic lobe again noted image 73/2. Postcholecystectomy choledocho urostomy without complication. Pancreas: Post Whipple procedure. Small amount of soft tissue previous described adjacent to to the SMA measures 10 mm 10 mm (image 76/series 2). No significant interval change. No peripancreatic adenopathy. Spleen: Normal spleen Adrenals/urinary tract: Adrenal glands and kidneys are normal. The ureters and bladder normal. Stomach/Bowel: Post partial gastrectomy related level procedure. Small bowel normal. Pancreas scratch the appendix normal. The colon and rectosigmoid colon are normal. Vascular/Lymphatic: Abdominal aorta is normal caliber. There is no retroperitoneal or periportal lymphadenopathy. No pelvic lymphadenopathy. Reproductive: Uterus normal. LEFT ovarian cyst again noted measuring 5.3 x 3.3 cm compared to 5.1 x 3.1 cm. Other: No peritoneal or omental metastatic nodularity. Musculoskeletal: No aggressive osseous lesion. IMPRESSION: CHEST: 1. Stable small pulmonary nodules. 2. No thoracic adenopathy. PELVIS: 1. Stable small volume soft tissue adjacent to the SMA. 2. No evidence of progressive metastatic disease in the abdomen pelvis. 3. Stable LEFT ovarian cyst. 4. Ascending thoracic aorta is within normal limits in diameter. Electronically Signed   By: Jackquline Boxer M.D.   On: 01/02/2024 10:01

## 2024-03-27 NOTE — Assessment & Plan Note (Signed)
 Mostly bothering her toes, grade 2.  She did not tolerate Cymbalta .   Stable symptoms She gets acupuncture sessions which help her symptoms.

## 2024-04-03 ENCOUNTER — Ambulatory Visit
Admission: RE | Admit: 2024-04-03 | Discharge: 2024-04-03 | Disposition: A | Discharge status: Home / Self Care | Source: Ambulatory Visit | Attending: Oncology | Admitting: Oncology

## 2024-04-03 DIAGNOSIS — C259 Malignant neoplasm of pancreas, unspecified: Secondary | ICD-10-CM | POA: Insufficient documentation

## 2024-04-03 MED ORDER — IOHEXOL 300 MG/ML  SOLN
100.0000 mL | Freq: Once | INTRAMUSCULAR | Status: AC | PRN
  Administered 2024-04-03: 100 mL via INTRAVENOUS

## 2024-04-09 ENCOUNTER — Encounter: Payer: Self-pay | Admitting: Oncology

## 2024-04-09 ENCOUNTER — Other Ambulatory Visit: Payer: Self-pay | Admitting: Oncology

## 2024-04-09 NOTE — Telephone Encounter (Signed)
 Please schedule MD only in 2 weeks for CT scan results.

## 2024-04-10 ENCOUNTER — Inpatient Hospital Stay: Admitting: Oncology

## 2024-04-10 ENCOUNTER — Inpatient Hospital Stay

## 2024-04-23 ENCOUNTER — Telehealth: Payer: Self-pay | Admitting: Oncology

## 2024-04-23 ENCOUNTER — Inpatient Hospital Stay: Attending: Oncology | Admitting: Oncology

## 2024-04-23 ENCOUNTER — Encounter: Payer: Self-pay | Admitting: Oncology

## 2024-04-23 ENCOUNTER — Inpatient Hospital Stay

## 2024-04-23 ENCOUNTER — Telehealth: Payer: Self-pay

## 2024-04-23 VITALS — BP 137/77 | HR 59 | Temp 98.1°F | Resp 18 | Wt 135.1 lb

## 2024-04-23 DIAGNOSIS — G620 Drug-induced polyneuropathy: Secondary | ICD-10-CM | POA: Diagnosis not present

## 2024-04-23 DIAGNOSIS — Z87891 Personal history of nicotine dependence: Secondary | ICD-10-CM | POA: Diagnosis not present

## 2024-04-23 DIAGNOSIS — C259 Malignant neoplasm of pancreas, unspecified: Secondary | ICD-10-CM | POA: Diagnosis not present

## 2024-04-23 DIAGNOSIS — C250 Malignant neoplasm of head of pancreas: Secondary | ICD-10-CM | POA: Insufficient documentation

## 2024-04-23 DIAGNOSIS — K8689 Other specified diseases of pancreas: Secondary | ICD-10-CM | POA: Diagnosis not present

## 2024-04-23 DIAGNOSIS — T451X5A Adverse effect of antineoplastic and immunosuppressive drugs, initial encounter: Secondary | ICD-10-CM

## 2024-04-23 DIAGNOSIS — C787 Secondary malignant neoplasm of liver and intrahepatic bile duct: Secondary | ICD-10-CM | POA: Diagnosis present

## 2024-04-23 DIAGNOSIS — Z5111 Encounter for antineoplastic chemotherapy: Secondary | ICD-10-CM | POA: Diagnosis present

## 2024-04-23 MED ORDER — PROCHLORPERAZINE MALEATE 10 MG PO TABS: 10.0000 mg | ORAL_TABLET | Freq: Four times a day (QID) | ORAL | 1 refills | Status: DC | PRN

## 2024-04-23 MED ORDER — ONDANSETRON HCL 8 MG PO TABS: 8.0000 mg | ORAL_TABLET | Freq: Three times a day (TID) | ORAL | 1 refills | Status: AC | PRN

## 2024-04-23 MED ORDER — LOPERAMIDE HCL 2 MG PO CAPS: ORAL_CAPSULE | ORAL | 3 refills | Status: AC

## 2024-04-23 NOTE — Progress Notes (Signed)
 DISCONTINUE ON PATHWAY REGIMEN - Pancreatic Adenocarcinoma     A cycle is every 28 days:     Nab-paclitaxel  (protein bound)      Gemcitabine    **Always confirm dose/schedule in your pharmacy ordering system**  PRIOR TREATMENT: [id number]: Nab-Paclitaxel  (Abraxane ) 125 mg/m2 D1, 8, 15 + Gemcitabine  1,000 mg/m2 D1, 8, 15 q28 Days Until Progression or Toxicity  START OFF PATHWAY REGIMEN - Pancreatic Adenocarcinoma   [id number] (Leucovorin  IV D1 + Fluorouracil  IV D1/CIV D1,2 + Irinotecan  IV D1) q14 Days:   A cycle is every 14 days:     Irinotecan       Leucovorin       Fluorouracil       Fluorouracil    **Always confirm dose/schedule in your pharmacy ordering system**  Patient Characteristics: Metastatic Disease, Third Line and Beyond, MSS/pMMR or MSI Unknown Therapeutic Status: Metastatic Disease Line of Therapy: [employer] Manager Status: MSS/pMMR Intent of Therapy: Non-Curative / Palliative Intent, Discussed with Patient

## 2024-04-23 NOTE — Assessment & Plan Note (Signed)
 Chemotherapy plan as listed above

## 2024-04-23 NOTE — Assessment & Plan Note (Signed)
 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 Continue calcium  supplementation, 2000 mg daily-divided in 2-3 doses IV calcium  gluconate 2g if calcium  <=8.5 Normal vitamin D  level Calcium  level has been stable

## 2024-04-23 NOTE — Assessment & Plan Note (Addendum)
 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  Pathology proved liver metastatic disease- additional chemotherapy --> whipple procedure.ypT1b ypN0--> adjuvant Gem/Abraxane  finished in August 2023--> Sept 2024 CT showed progression in lung --> Nov 2024 Wedge biopsy- Recurrent pancreatic cancer with lung metastasis--> 12/2023 CT stable disease Currently on Gemcitabine  and Abraxane  1 week on 1 week off schedule due to neutropenia.  Labs are reviewed and discussed with patient.  04/03/2024 CT - disease progression -soft tissue close to SMA increased size.  Slightly enlarged lung nodules. Signatera circulating tumor DNA testing- negative.  Recommend to discontinue gemcitabine /Abraxane . She has good performance status, recommend to recycle previous chemotherapy regimen with 5-FU and Irinotecan . -Irinotecan  dose reduced to 125 mg/m2,  infusion time-180 minutes. As needed antidiarrheal medication.

## 2024-04-23 NOTE — Telephone Encounter (Signed)
 Patient assistance application for Creon  faxed to [employer] @ [phone]. Fax confirmation received.

## 2024-04-23 NOTE — Telephone Encounter (Signed)
 Per MD secure chat, schedule pt (from [facility]) this week if availability in infusion.   There was space 10/29. I called and spoke with pt and confirmed the appt date and time.

## 2024-04-23 NOTE — Assessment & Plan Note (Signed)
 Mostly bothering her toes, grade 2.  She did not tolerate Cymbalta .   Stable symptoms She gets acupuncture sessions which help her symptoms.

## 2024-04-23 NOTE — Assessment & Plan Note (Signed)
 S/p  Whipple procedure..  Continue Creon , continue 24000 units TID with meals and  with her snacks

## 2024-04-23 NOTE — Progress Notes (Signed)
 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  Pathology proved liver metastatic disease- additional chemotherapy --> whipple procedure.ypT1b ypN0--> adjuvant Gem/Abraxane  finished in August 2023--> Sept 2024 CT showed progression in lung --> Nov 2024 Wedge biopsy- Recurrent pancreatic cancer with lung metastasis--> 12/2023 CT stable disease Currently on Gemcitabine  and Abraxane  1 week on 1 week off schedule due to neutropenia.  Labs are reviewed and discussed with patient.  04/03/2024 CT - disease progression -soft tissue close to SMA increased size.  Slightly enlarged lung nodules. Signatera circulating tumor DNA testing- negative.  Recommend to discontinue gemcitabine /Abraxane . She has good performance status, recommend to recycle previous chemotherapy regimen with 5-FU and Irinotecan . -Irinotecan  dose reduced to 125 mg/m2,  infusion time-180 minutes. As needed antidiarrheal medication.    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  She did not tolerate Cymbalta .   Stable symptoms She gets acupuncture sessions which help her symptoms.   Encounter for antineoplastic chemotherapy Chemotherapy plan as listed above  Hypocalcemia Likely due to malabsorption. Continue calcium  supplementation, 2000 mg daily-divided in 2-3 doses IV calcium  gluconate 2g if calcium  <=8.5 Normal vitamin D  level Calcium  level has been stable  Pancreatic  insufficiency S/p  Whipple procedure..  Continue Creon , continue 24000 units TID with meals and  with her snacks    Orders Placed This Encounter  Procedures   Miscellaneous test (send-out)    Standing Status:   Future    Expiration Date:   04/23/2025    Test name / description::   TEmpus    Follow up  2 weeks lab MD FOLFIRI  All questions were answered. The patient knows to [name] the clinic with any problems, questions or concerns.  [name] Babara, MD, PhD [facility] Hematology Oncology 04/23/2024   Cassandra Jon HERO, MD    HISTORY OF PRESENTING ILLNESS:   Cassandra [name] is a  68 y.o.  female with presents for follow up of Stage IV pancreatic adenocarcinoma Patient initially presented with jaundice, transaminitis, bilirubin was 9.9.  CA 19-9 was 1874.  Patient also reports unintentional weight loss. 02/08/2020 MRI abdomen and MRCP with and without contrast was done at [facility] which showed pancreatic head mass measuring up to 3 cm, with marked associated narrowing of the portal vein confluence.  SMA is preserved.  Marked intrahepatic and extrahepatic biliary duct dilatation as well as mild dilatation of the main pancreatic duct. Multiple hepatic masses highly concerning for metastatic disease.  Patient underwent EUS on 02/19/2020, which showed irregular mass identified in the pancreatic head, hypoechoic, measured 35mmx33mm, sonographic evidence concerning for invasion into the superior mesenteric artery.  There is no sign of significant abnormality in the main pancreatic duct.  Dilatation of common bile duct which measured up to 16 mm.  Region of celiac artery was visualized and showed no signs of significant abnormality.  No lymphadenopathy.  FNA showed adenocarcinoma.  02/19/2020, ERCP, malignant.  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  The stricture was treated with placement of wall flex  metal stent.  Patient was seen by  [facility] oncology Dr. [name] and was recommended for 3 drug regimen FOLFIRINOX.  Patient prefers to do chemotherapy locally at [facility].  Patient was referred to establish care today. She denies any pain.  Since stent placement, skin jaundice has improved.  Itchiness has also improved. Patient was accompanied by her husband today.  She has a family history of breast cancer in sister and paternal aunt, colon cancer paternal grandmother.  #No reportable targetable mutation on NGS 9/14/2021cycle 1 FOLFIRINOX.  Patient received oxaliplatin  and about 50% of Irinotecan  on day 1 and had experienced neurologic symptoms.  She went to ER and working diagnosis is TIA, and eventually I think this is due to irinotecan  side effects -irinotecan -associated dysarthria, lip/tongue numbness.  Adjustment was made for Irinotecan  to be  infused over 180 minutes.  Atropine  0.5 mg once prior to the irinotecan . No recurrent symptoms.   # 03/19/2020-03/21/2020 patient was admitted due to sepsis with strep pneumonia bacteremia.  Patient was treated with IV Rocephin .  TEE was done which showed no vegetation.  No PFO or ASD.  Patient was discharged home and he finished full course of 14 days of IV Rocephin  on 04/02/2020 per ID recommendation.  Repeat blood culture was also negative.  08/25/20 cycle 10 FOLFIRINOX 09/08/20- present  Starting cycle 11, FOLFIRI, Oxaliplatin  discontinued due to neuropathy   #NGS showed no reportable targetable mutation #Genetic testing-[employer] diagnostic testing showed no pathological variants identified. MS stable, TMB 0mut/mb, KRAS G12D, SF3B1 K700E, TP53 V235fs*30  #01/14/2021  CT done at [facility] during the interval, stable disease.  # Her case was presented by Dr.Jia from [facility] tumor board. Cassandra [name] were 4~5 liver lesions on OSH MRI last year at the time of diagnosis highly suspicious for metastasis. She is not eligible for surgical protocol for metastasis resection given the number of liver metastasis is >3.  However given her excellent and durable response to chemo, surgical resection may be considered pending sustained disease control at 1 year and may require partial hepatectomy first to see if there is viable tumor before proceeding to Whipple resection ]  Patient had a COVID-19 infection in September 2022.  03/23/2021 CT abdomen pelvis No significant change in the ill-defined pancreatic head mass or  associated biliary ductal and pancreatic ductal dilation.  Slight interval enlargement of subcentimeter anterior peripancreatic  lymph node, nonspecific. Attention on follow-up per clinical protocol. Similar enlargement of the ascending thoracic aorta measuring 4.4 cm  03/23/2021 MRI abdomen w and wo  Increasing ill-defined hypoenhancement of the pancreatic head surrounding the common bile duct stent, in keeping with known pancreatic malignancy. Anterior peripancreatic lymph node is better evaluated on CT.  Unchanged position of a common bile duct stent with similar mild diffuse intrahepatic ductal dilation and pneumobilia. No evidence of metastatic disease in the abdomen or pelvis. Partially visualized cystic left adnexal lesion measuring up to 3.9 cm.   04/10/2021, patient underwent liver resection at [facility], by Dr. Zani.   Pathology report from [facility] was reviewed. Segments 3 partial hepatectomy, negative for viable tumor. Section 5/6 partial hepatectomy part 1 and 2, microscopic foci of residual viable adenocarcinoma, morphologically consistent with history of pancreatic primary.  Parenchymal margin is uninvolved.  #Patient resumed on FOLFIRI on 04/28/2021. She got another cycle on 05/12/2021 #05/26/2021, patient did not get additional chemotherapy due to transaminitis.  Shared decision was made to stop FOLFIRI and switch to gemcitabine  Abraxane  treatments.  05/26/2021, patient developed transaminitis, AST 763, ALT 691, alkaline  phosphatase 364.  Bilirubin 0.7 05/26/2021 stat ultrasound abdomen right  upper quadrant showed interval development of 3.3 x 1.8 cm complex mass in the right hepatic lobe.  Increased extrahepatic ductal dilatation.  Concerning for CBD obstruction. 05/29/2021, MRI abdomen MRCP with and without contrast showed unchanged pancreatic head soft tissue.  Severe intra and extrahepatic biliary ductal dilatation.  Common bile duct stents remain in position however patency is not established.  No evidence of lymphadenopathy or metastatic disease in the abdomen.  With further communication with radiologist, addendum was added that there is internal fluid signal and no appreciable associated contrast enhancement measuring 3.4 x 2.3 cm.  This appearance is generally not consistent with metastasis and is of uncertain significance.  Possibly reflecting hepatic abscess or residual of subcapsular hematoma.  Patient was seen by [facility] Dr. Oneal team.  He had ERCP done on 06/05/2021, with findings of One stent from the biliary tree was seen in the major papilla. The stent had migrated significantly into the duodenum. This is the cause of stent malfunction. One stent was removed from the biliary tree. Prior biliary sphincterotomy appeared open. - A single severe biliary stricture was found in the lower third of the main bile duct with upstream dilation. The stricture was alignant appearing.- The biliary tree was swept and sludge was found.- One fully covered metal stent was placed into the common bile duct across the stricture.- No pancreatogram performed.  06/12/2021-08/10/2021 gemcitabine  and Abraxane .   10/14/2021, patient underwent Whipple procedure. Liver biopsy negative for malignancy. Review procedure showed invasive adenocarcinoma, moderately differentiated, centered in pancreatic head and confined in the Pancreas. Surgical margin is negative for malignancy.  36 lymph nodes were all negative for malignancy.  1 hepatic artery lymph node was harvested and was negative.  Gallbladder negative for  malignancy.  Cystic duct excision negative for malignancy. pT1b pN0  10/25/2021, CT abdomen pelvis with contrast showed rim-enhancing fluid collection in the region of the hepatic hilum. Patient had a readmission for superficial wound infection.  She completed antibiotics on 11/03/2021.  11/03/2021, CT abdomen pelvis with contrast showed near resolution of previously seen fluid collection in the region of the hepatic hilum.  Similar appearance of the area of hypoenhancement in the liver.  Attention on follow-up.  11/17/2021, resumed on gemcitabine /Abraxane . 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  12/12/ 2023 CT scan at [facility] shows no evidence of cancer recurrence.-NED 09/21/2022 CT chest abdomen pelvis w contrast at [facility] showed-No evidence of metastatic disease in the chest, abdomen, or pelvis. 12/21/2022 CT chest abdomen pelvis w contrast at [facility] showed-No evidence of metastatic disease in the chest, abdomen, or pelvis.   03/22/2023 CT scan at [facility] showed enlarging lung nodules. RLL 1cm, LLL 0.5cm, RUL 0.5cm Stable hypoattenuating right inferior hepatic lobe lesion measuring 0.9 cm.  Similar left adnexal cystic lesion measuring up to 5.0 cm.   04/06/2023 s/p lung nodule biopsy.  Rare atypical epithelioid cells present with evidence suggestive of invasion, suspicious but not diagnostic for malignancy.   05/20/2023: Right robot assisted thoracoscopic wedge resection with MLND (Dr. Murlean)   Pathology:  Suspicious for malignancy but not diagnostic A. Lymph node, level 8, biopsy: Metastatic carcinoma involving one lymph node (1/1).  B. Lung, right lower lobe, wedge resection: Lung tissue positive for adenocarcinoma, consistent with metastasis from pancreas primary. See comment. Tumor size: 1.0 cm. Operative margin: Negative  Comment: Note is made of the patient's history of invasive adenocarcinoma of the pancreatic head ([id number]; 10/14/2021), which is  compared to the current case. Comparison  is challenging given the post-treatment nature of the pancreatic tumor; however, there are some morphologic similarities.  Although often unhelpful in the setting of lung primary versus pancreatic primary, immunohistochemical stains were performed. Tumor cells demonstrate the below immunoprofile which would could support pulmonary adenocarcinoma with enteric differentiation but would also be consistent with metastasis from the patient's known pancreatic primary; the latter is favored.              Positive                             Negative CK7 (strong, diffuse) TTF-1  CK20 (strong, focal)                    Positive and negative controls perform appropriately.    C. Lymph node, level 7, biopsy: Metastatic carcinoma involving one lymph node (1/1).  D. Lymph node, level 4R, biopsy: One lymph node, negative for malignancy (0/1).   INTERVAL HISTORY Cassandra [name] is a 68 y.o. female who has above history reviewed by me today presents for stage IV pancreatic cancer.   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  She gets acupuncture session which helped her symptoms.  She takes calcium  supplementation as instructed.  She take Creon  for pancreatic insufficiency  + fatigue after chemotherapy 04/03/2024, CT chest abdomen pelvis with contrast showed Enlargement of soft tissue nodule adjacent to the right aspect of the superior mesenteric artery.  Multiple small bilateral pulmonary nodules, slightly enlarged.    Review of Systems  Constitutional:  Positive for fatigue. Negative for appetite change, chills, fever and unexpected weight change.  HENT:   Negative for hearing loss and voice change.   Eyes:  Negative for eye problems.  Respiratory:  Negative for chest tightness and cough.   Cardiovascular:  Negative for chest pain.  Gastrointestinal:  Negative for abdominal distention, abdominal pain, blood in stool and nausea.  Endocrine: Negative  for hot flashes.  Genitourinary:  Negative for difficulty urinating and frequency.   Musculoskeletal:  Positive for arthralgias.  Skin:  Negative for itching and rash.  Neurological:  Positive for numbness. Negative for extremity weakness.  Hematological:  Negative for adenopathy.  Psychiatric/Behavioral:  Negative for confusion.     MEDICAL HISTORY:  Past Medical History:  Diagnosis Date   Allergy    Anemia    Bacteremia due to Streptococcus pneumoniae 03/19/2020   Cancer [facility])    pancreatic cancer   Colon polyps    Family history of breast cancer    Neuropathy due to drug    chemo induced   Neutropenia 04/18/2020   Osteopenia after menopause 05/2017   femoral neck T score -2.0   Personal history of chemotherapy    current for pancreatic ca   PONV (postoperative nausea and vomiting)    Pure hypercholesterolemia    Sepsis (HCC) 03/19/2020    SURGICAL HISTORY: Past Surgical History:  Procedure Laterality Date   COLONOSCOPY  07/2015   WNL   COLONOSCOPY  2008/2011   COLONOSCOPY WITH PROPOFOL  N/A 07/10/2021   Procedure: COLONOSCOPY WITH PROPOFOL ;  Surgeon: Maryruth Ole DASEN, MD;  Location: [facility] ENDOSCOPY;  Service: Endoscopy;  Laterality: N/A;   IR CV LINE INJECTION  04/28/2021   OVARIAN CYST REMOVAL  1992   dermoid-Dr CAK   PORTA CATH INSERTION N/A 03/07/2020   Procedure: PAT  CATH INSERTION;  Surgeon: Marea Selinda RAMAN, MD;  Location: [facility] INVASIVE CV LAB;  Service: Cardiovascular;  Laterality: N/A;   TEE WITHOUT CARDIOVERSION N/A 03/21/2020   Procedure: TRANSESOPHAGEAL ECHOCARDIOGRAM (TEE);  Surgeon: Fernand Denyse LABOR, MD;  Location: [facility] ORS;  Service: Cardiovascular;  Laterality: N/A;   TUBAL LIGATION  1993    SOCIAL HISTORY: Social History   Socioeconomic History   Marital status: Married    Spouse name: Not on file   Number of children: 2   Years of education: Not on file   Highest education level: Bachelor's degree (e.g., BA, AB, BS)  Occupational History    Occupation: Runner, Broadcasting/film/video    Comment: retired   Occupation: Visual Merchandiser  Tobacco Use   Smoking status: Former    Current packs/day: 0.00    Average packs/day: 1 pack/day for 10.0 years (10.0 ttl pk-yrs)    Types: Cigarettes    Start date: 06/29/1975    Quit date: 06/28/1985    Years since quitting: 38.8   Smokeless tobacco: Never   Tobacco comments:    Quit smoking 1987  Vaping Use   Vaping status: Never Used  Substance and Sexual Activity   Alcohol use: Yes    Alcohol/week: 5.0 standard drinks of alcohol    Types: 5 Glasses of wine per week    Comment: 0-2 mixed drinks a day   Drug use: No   Sexual activity: Not Currently    Birth control/protection: Post-menopausal  Other Topics Concern   Not on file  Social History Narrative   Not on file   Social Drivers of Health   Financial Resource Strain: Low Risk  (04/07/2024)   Received from [facility]   Overall Financial Resource Strain (CARDIA)    Difficulty of Paying Living Expenses: Not hard at all  Food Insecurity: No Food Insecurity (04/07/2024)   Received from [facility]   Hunger Vital Sign    Within the past 12 months, you worried that your food would run out before you got the money to buy more.: Never true    Within the past 12 months, the food you bought just didn't last and you didn't have money to get more.: Never true  Transportation Needs: No Transportation Needs (04/07/2024)   Received from [facility] - Transportation    In the past 12 months, has lack of transportation kept you from medical appointments or from getting medications?: No    Lack of Transportation (Non-Medical): No  Physical Activity: Sufficiently Active (10/18/2023)   Exercise Vital Sign    Days of Exercise per Week: 6 days    Minutes of Exercise per Session: 30 min  Stress: No Stress Concern Present (10/18/2023)   [employer] of Occupational Health - Occupational Stress Questionnaire     Feeling of Stress : Not at all  Social Connections: Moderately Integrated (10/18/2023)   Social Connection and Isolation Panel    Frequency of Communication with Friends and Family: More than three times a week    Frequency of Social Gatherings with Friends and Family: More than three times a week    Attends Religious Services: More than 4 times per year    Active Member of [employer] or Organizations: No    Attends Banker Meetings: Never    Marital Status: Married  Catering Manager Violence: Not At Risk (10/18/2023)   Humiliation, Afraid, Rape, and Kick questionnaire    Fear of Current or Ex-Partner: No  Emotionally Abused: No    Physically Abused: No    Sexually Abused: No    FAMILY HISTORY: Family History  Problem Relation Age of Onset   Breast cancer Paternal Aunt 9   Diabetes Mother    Osteoporosis Mother    Hyperlipidemia Father    Rheumatic fever Father    Valvular heart disease Father    Cancer Paternal Grandmother        possible colon   Breast cancer Sister 48    ALLERGIES:  is allergic to penicillin g.  MEDICATIONS:  Current Outpatient Medications  Medication Sig Dispense Refill   b complex vitamins capsule Take 1 capsule by mouth daily.     Calcium  Carbonate-Vit D-Min (CALCIUM  1200 PO) Take by mouth.     Cholecalciferol  25 MCG (1000 UT) tablet Take 2,000 Units by mouth daily.     clobetasol  cream (TEMOVATE ) 0.05 %      fluticasone (FLONASE) 50 MCG/ACT nasal spray Place 1 spray into both nostrils daily.     lidocaine -prilocaine  (EMLA ) cream Apply small amount to port and cover with saran wrap 1-2 hours prior to port access 30 g 6   loratadine (CLARITIN REDITABS) 10 MG dissolvable tablet Take 10 mg by mouth daily.     Melatonin 2.5 MG CHEW Chew by mouth.     Multiple Vitamins-Minerals (MULTIVITAMIN WITH MINERALS) tablet Take 1 tablet by mouth daily.     ondansetron  (ZOFRAN -ODT) 8 MG disintegrating tablet Take 1 tablet (8 mg total) by mouth every 8  (eight) hours as needed for nausea or vomiting. 60 tablet 2   Pancrelipase , Lip-Prot-Amyl, (CREON ) 24000-76000 units CPEP TAKE 1 CAPSULE THREE TIMES DAILY BEFORE MEALS 200 capsule 3   prochlorperazine  (COMPAZINE ) 10 MG tablet Take 10 mg by mouth every 6 (six) hours as needed for nausea or vomiting.     simethicone  (GAS-X) 80 MG chewable tablet Chew 1 tablet (80 mg total) by mouth every 8 (eight) hours as needed for flatulence. 60 tablet 0   aspirin  81 MG chewable tablet Chew 81 mg by mouth daily. (Patient not taking: Reported on 04/23/2024)     senna-docusate (SENOKOT-S) 8.6-50 MG tablet Take by mouth. (Patient not taking: Reported on 04/23/2024)     No current facility-administered medications for this visit.   Facility-Administered Medications Ordered in Other Visits  Medication Dose Route Frequency Provider Last Rate Last Admin   0.9 %  sodium chloride  infusion   Intravenous Continuous Cassandra Call, MD 10 mL/hr at 09/12/23 1241 Infusion Verify at 09/12/23 1241   calcium  gluconate 2 g in sodium chloride  0.9 % 100 mL IVPB  2 g Intravenous PRN Cassandra Call, MD   Stopped at 09/12/23 1101   heparin  lock flush 100 UNIT/ML injection            prochlorperazine  (COMPAZINE ) 10 MG tablet            sodium chloride  flush (NS) 0.9 % injection 10 mL  10 mL Intravenous PRN Cassandra Call, MD   10 mL at 02/18/21 0858   sodium chloride  flush (NS) 0.9 % injection 10 mL  10 mL Intravenous Once Borders, Joshua R, NP       sodium chloride  flush (NS) 0.9 % injection 10 mL  10 mL Intravenous Once Cassandra Call, MD         PHYSICAL EXAMINATION: ECOG PERFORMANCE STATUS: 0 - Asymptomatic Vitals:   04/23/24 0908  BP: 137/77  Pulse: (!) 59  Resp: 18  Temp: 98.1 F (36.7 C)  SpO2: 100%   Filed Weights   04/23/24 0908  Weight: 135 lb 1.6 oz (61.3 kg)     Physical Exam Constitutional:      General: She is not in acute distress. HENT:     Head: Normocephalic and atraumatic.  Eyes:     General: No scleral  icterus. Cardiovascular:     Rate and Rhythm: Normal rate and regular rhythm.     Heart sounds: Normal heart sounds.  Pulmonary:     Effort: Pulmonary effort is normal. No respiratory distress.  Abdominal:     General: Bowel sounds are normal. There is no distension.     Palpations: Abdomen is soft.  Musculoskeletal:        General: No deformity. Normal range of motion.     Cervical back: Normal range of motion and neck supple.  Skin:    General: Skin is warm.     Coloration: Skin is not jaundiced.     Findings: No erythema.  Neurological:     Mental Status: She is alert and oriented to person, place, and time. Mental status is at baseline.  Psychiatric:        Mood and Affect: Mood normal.     LABORATORY DATA:  I have reviewed the data as listed    Latest Ref Rng & Units 03/27/2024    8:33 AM 03/13/2024    8:38 AM 02/23/2024    9:05 AM  CBC  WBC 4.0 - 10.5 K/uL 4.3  4.6  4.4   Hemoglobin 12.0 - 15.0 g/dL 87.7  87.7  87.8   Hematocrit 36.0 - 46.0 % 36.6  36.4  35.3   Platelets 150 - 400 K/uL 163  226  271       Latest Ref Rng & Units 03/27/2024    8:33 AM 03/13/2024    8:38 AM 02/23/2024    9:05 AM  CMP  Glucose 70 - 99 mg/dL 860  71  839   BUN 8 - 23 mg/dL 14  11  13    Creatinine 0.44 - 1.00 mg/dL 9.31  9.34  9.35   Sodium 135 - 145 mmol/L 139  139  137   Potassium 3.5 - 5.1 mmol/L 3.8  4.1  4.0   Chloride 98 - 111 mmol/L 107  109  106   CO2 22 - 32 mmol/L 23  24  23    Calcium  8.9 - 10.3 mg/dL 8.7  8.7  8.8   Total Protein 6.5 - 8.1 g/dL 6.5  6.5  6.4   Total Bilirubin 0.0 - 1.2 mg/dL 0.8  0.7  0.7   Alkaline Phos 38 - 126 U/L 79  80  83   AST 15 - 41 U/L 37  46  29   ALT 0 - 44 U/L 32  36  21      RADIOGRAPHIC STUDIES: I have personally reviewed the radiological images as listed and agreed with the findings in the report. Reviewed findings of MRI abdomen MRCP done at [facility]. CT CHEST ABDOMEN PELVIS W CONTRAST Result Date: 04/05/2024 CLINICAL DATA:  Pancreatic  cancer * Tracking Code: BO * EXAM: CT CHEST, ABDOMEN, AND PELVIS WITH CONTRAST TECHNIQUE: Multidetector CT imaging of the chest, abdomen and pelvis was performed following the standard protocol during bolus administration of intravenous contrast. RADIATION DOSE REDUCTION: This exam was performed according to the departmental dose-optimization program which includes automated exposure control, adjustment of the mA and/or kV according to patient size and/or use of iterative  reconstruction technique. CONTRAST:  OMNIPAQUE  IOHEXOL  300 MG/ML  SOLN COMPARISON:  01/02/2024 FINDINGS: CT CHEST FINDINGS Cardiovascular: Right chest port catheter. 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 Normal heart size. No pericardial effusion. Mediastinum/Nodes: No enlarged mediastinal, hilar, or axillary lymph nodes. Thyroid gland, trachea, and esophagus demonstrate no significant findings. Lungs/Pleura: Unchanged wedge resection of the dependent right lower lobe. Multiple small bilateral pulmonary nodules, nodule of the dependent left lower lobe is slightly enlarged, measuring 0.8 x 0.5 cm, previously no greater than 0.5 cm (series 4, image 78). Multiple additional small nodules unchanged, for example a 0.5 cm nodule of the anterior right upper lobe (series 4, image 76). No new nodules. No pleural effusion or pneumothorax. Musculoskeletal: No chest wall abnormality. No acute osseous findings. CT ABDOMEN PELVIS FINDINGS Hepatobiliary: No focal liver abnormality is seen. Status post cholecystectomy and hepaticojejunostomy. No biliary dilatation. 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 Interval enlargement of soft tissue nodule adjacent to the right aspect of the superior mesenteric artery, measuring 1.9 x 1.6 cm, previously 1.0 x 1.0 cm (series 2, image 76). Spleen: Normal in size without significant  abnormality. Adrenals/Urinary Tract: Adrenal glands are unremarkable. Kidneys are normal, without renal calculi, solid lesion, or hydronephrosis. Bladder is unremarkable. Stomach/Bowel: Stomach is within normal limits. Appendix appears normal. No evidence of bowel wall thickening, distention, or inflammatory changes. Vascular/Lymphatic: Aortic atherosclerosis. No enlarged abdominal or pelvic lymph nodes. Reproductive: No solid mass or other abnormality. Unchanged left ovarian cyst measuring 5.4 x 3.6 cm (series 2, image 104). Other: No abdominal wall hernia or abnormality. No ascites. Musculoskeletal: No acute osseous findings. IMPRESSION: 1. Status post Whipple procedure. 2. Interval enlargement of soft tissue nodule adjacent to the right aspect of the superior mesenteric artery, consistent with worsened locally recurrent disease. 3. Multiple small bilateral pulmonary nodules, nodule of the dependent left lower lobe is slightly enlarged, measuring 0.8 x 0.5 cm, previously no greater than 0.5 cm. Multiple additional small nodules unchanged. No new nodules. 4. 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 Aortic Atherosclerosis (ICD10-I70.0). Electronically Signed   By: Marolyn JONETTA Jaksch M.D.   On: 04/05/2024 20:58

## 2024-04-24 ENCOUNTER — Telehealth: Payer: Self-pay

## 2024-04-24 NOTE — Telephone Encounter (Signed)
-----   Message from Zelphia Cap sent at 04/23/2024  6:54 PM EDT ----- Please arrange her to get liquid NGS on 10/29 along with her chemo labs. She will get 5-FU  and irinotecan  this week.  Thank you

## 2024-04-24 NOTE — Telephone Encounter (Signed)
 Order for [facility] liquid bx (xF) placed. Blood will be collected on 04/25/24

## 2024-04-24 NOTE — Progress Notes (Signed)
 Pharmacist Chemotherapy Monitoring - Initial Assessment    Anticipated start date: 04/25/24   The following has been reviewed per standard work regarding the patient's treatment regimen: The patient's diagnosis, treatment plan and drug doses, and organ/hematologic function Lab orders and baseline tests specific to treatment regimen  The treatment plan start date, drug sequencing, and pre-medications Prior authorization status  Patient's documented medication list, including drug-drug interaction screen and prescriptions for anti-emetics and supportive care specific to the treatment regimen The drug concentrations, fluid compatibility, administration routes, and timing of the medications to be used The patient's access for treatment and lifetime cumulative dose history, if applicable  The patient's medication allergies and previous infusion related reactions, if applicable   Changes made to treatment plan:  Leucovorin  infusion extended to match Irinotecan  (180 min; infuse concurrently)  Follow up needed:  F/U addition for pre- & post- infusion Diphenhydramine  as per previous tx plan from 2021 to manage 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   Sherriann Szuch, Pharm.D., CPP 04/24/2024@12 :26 PM

## 2024-04-25 ENCOUNTER — Inpatient Hospital Stay

## 2024-04-25 VITALS — BP 126/80 | HR 63 | Temp 99.0°F | Resp 17

## 2024-04-25 DIAGNOSIS — Z5111 Encounter for antineoplastic chemotherapy: Secondary | ICD-10-CM | POA: Diagnosis not present

## 2024-04-25 DIAGNOSIS — C259 Malignant neoplasm of pancreas, unspecified: Secondary | ICD-10-CM

## 2024-04-25 LAB — CMP (CANCER CENTER ONLY)
ALT: 26 U/L (ref 0–44)
AST: 31 U/L (ref 15–41)
Albumin: 4 g/dL (ref 3.5–5.0)
Alkaline Phosphatase: 76 U/L (ref 38–126)
Anion gap: 8 (ref 5–15)
BUN: 13 mg/dL (ref 8–23)
CO2: 24 mmol/L (ref 22–32)
Calcium: 9 mg/dL (ref 8.9–10.3)
Chloride: 108 mmol/L (ref 98–111)
Creatinine: 0.64 mg/dL (ref 0.44–1.00)
GFR, Estimated: >60 mL/min (ref >60)
Glucose, Bld: 95 mg/dL (ref 70–99)
Potassium: 4.2 mmol/L (ref 3.5–5.1)
Sodium: 140 mmol/L (ref 135–145)
Total Bilirubin: 0.9 mg/dL (ref 0.0–1.2)
Total Protein: 6.6 g/dL (ref 6.5–8.1)

## 2024-04-25 LAB — MISCELLANEOUS TEST

## 2024-04-25 LAB — CBC WITH DIFFERENTIAL (CANCER CENTER ONLY)
Abs Immature Granulocytes: 0.02 K/uL (ref 0.00–0.07)
Basophils Absolute: 0 K/uL (ref 0.0–0.1)
Basophils Relative: 1 %
Eosinophils Absolute: 0.1 K/uL (ref 0.0–0.5)
Eosinophils Relative: 1 %
HCT: 35.2 % — ABNORMAL LOW (ref 36.0–46.0)
Hemoglobin: 12.1 g/dL (ref 12.0–15.0)
Immature Granulocytes: 0 %
Lymphocytes Relative: 19 %
Lymphs Abs: 1 K/uL (ref 0.7–4.0)
MCH: 32.9 pg (ref 26.0–34.0)
MCHC: 34.4 g/dL (ref 30.0–36.0)
MCV: 95.7 fL (ref 80.0–100.0)
Monocytes Absolute: 0.5 K/uL (ref 0.1–1.0)
Monocytes Relative: 9 %
Neutro Abs: 3.7 K/uL (ref 1.7–7.7)
Neutrophils Relative %: 70 %
Platelet Count: 197 K/uL (ref 150–400)
RBC: 3.68 MIL/uL — ABNORMAL LOW (ref 3.87–5.11)
RDW: 13.8 % (ref 11.5–15.5)
WBC Count: 5.3 K/uL (ref 4.0–10.5)
nRBC: 0 % (ref 0.0–0.2)

## 2024-04-25 MED ORDER — ATROPINE SULFATE 1 MG/ML IV SOLN
0.5000 mg | Freq: Once | INTRAVENOUS | Status: AC
  Administered 2024-04-25: 0.5 mg via INTRAVENOUS
  Filled 2024-04-25: qty 1

## 2024-04-25 MED ORDER — PALONOSETRON HCL INJECTION 0.25 MG/5ML
0.2500 mg | Freq: Once | INTRAVENOUS | Status: AC
  Administered 2024-04-25: 0.25 mg via INTRAVENOUS
  Filled 2024-04-25: qty 5

## 2024-04-25 MED ORDER — DIPHENHYDRAMINE HCL 25 MG PO CAPS
50.0000 mg | ORAL_CAPSULE | Freq: Once | ORAL | Status: AC
  Administered 2024-04-25: 50 mg via ORAL
  Filled 2024-04-25: qty 2

## 2024-04-25 MED ORDER — SODIUM CHLORIDE 0.9 % IV SOLN
INTRAVENOUS | Status: DC
  Filled 2024-04-25: qty 250

## 2024-04-25 MED ORDER — SODIUM CHLORIDE 0.9 % IV SOLN
2400.0000 mg/m2 | INTRAVENOUS | Status: DC
  Administered 2024-04-25: 4100 mg via INTRAVENOUS
  Filled 2024-04-25: qty 82

## 2024-04-25 MED ORDER — SODIUM CHLORIDE 0.9 % IV SOLN
125.0000 mg/m2 | Freq: Once | INTRAVENOUS | Status: AC
  Administered 2024-04-25: 200 mg via INTRAVENOUS
  Filled 2024-04-25: qty 10

## 2024-04-25 MED ORDER — DEXAMETHASONE SOD PHOSPHATE PF 10 MG/ML IJ SOLN
10.0000 mg | Freq: Once | INTRAMUSCULAR | Status: AC
  Administered 2024-04-25: 10 mg via INTRAVENOUS

## 2024-04-25 MED ORDER — DIPHENHYDRAMINE HCL 25 MG PO CAPS
25.0000 mg | ORAL_CAPSULE | Freq: Once | ORAL | Status: AC
  Administered 2024-04-25: 25 mg via ORAL
  Filled 2024-04-25: qty 1

## 2024-04-25 MED ORDER — SODIUM CHLORIDE 0.9 % IV SOLN
400.0000 mg/m2 | Freq: Once | INTRAVENOUS | Status: AC
  Administered 2024-04-25: 680 mg via INTRAVENOUS
  Filled 2024-04-25: qty 34

## 2024-04-27 ENCOUNTER — Inpatient Hospital Stay

## 2024-04-27 ENCOUNTER — Encounter: Payer: Self-pay | Admitting: Oncology

## 2024-04-27 NOTE — Telephone Encounter (Signed)
 Financial assistance completed and approved. Pt's out-of-pocket cost will be $0

## 2024-04-30 NOTE — Telephone Encounter (Signed)
 Application for [employer]  assistance has been approved through 06/27/2025

## 2024-05-01 ENCOUNTER — Ambulatory Visit

## 2024-05-01 ENCOUNTER — Other Ambulatory Visit: Payer: Self-pay | Admitting: Oncology

## 2024-05-01 ENCOUNTER — Other Ambulatory Visit

## 2024-05-01 ENCOUNTER — Ambulatory Visit: Admitting: Oncology

## 2024-05-01 DIAGNOSIS — C259 Malignant neoplasm of pancreas, unspecified: Secondary | ICD-10-CM

## 2024-05-02 ENCOUNTER — Encounter: Payer: Self-pay | Admitting: Oncology

## 2024-05-03 ENCOUNTER — Encounter: Payer: Self-pay | Admitting: Oncology

## 2024-05-03 NOTE — Telephone Encounter (Signed)
 Testing completed and sent to scan. Copy given to MD

## 2024-05-07 ENCOUNTER — Encounter: Payer: Self-pay | Admitting: Oncology

## 2024-05-09 ENCOUNTER — Inpatient Hospital Stay

## 2024-05-09 ENCOUNTER — Inpatient Hospital Stay (HOSPITAL_BASED_OUTPATIENT_CLINIC_OR_DEPARTMENT_OTHER): Admitting: Oncology

## 2024-05-09 ENCOUNTER — Encounter: Payer: Self-pay | Admitting: Oncology

## 2024-05-09 ENCOUNTER — Inpatient Hospital Stay: Attending: Oncology

## 2024-05-09 VITALS — BP 144/74 | HR 57

## 2024-05-09 VITALS — BP 124/84 | HR 60 | Temp 97.9°F | Resp 18 | Wt 133.8 lb

## 2024-05-09 DIAGNOSIS — Z5111 Encounter for antineoplastic chemotherapy: Secondary | ICD-10-CM | POA: Diagnosis present

## 2024-05-09 DIAGNOSIS — C259 Malignant neoplasm of pancreas, unspecified: Secondary | ICD-10-CM

## 2024-05-09 DIAGNOSIS — G620 Drug-induced polyneuropathy: Secondary | ICD-10-CM

## 2024-05-09 DIAGNOSIS — T451X5A Adverse effect of antineoplastic and immunosuppressive drugs, initial encounter: Secondary | ICD-10-CM

## 2024-05-09 DIAGNOSIS — C7800 Secondary malignant neoplasm of unspecified lung: Secondary | ICD-10-CM | POA: Diagnosis not present

## 2024-05-09 DIAGNOSIS — R110 Nausea: Secondary | ICD-10-CM | POA: Insufficient documentation

## 2024-05-09 DIAGNOSIS — C250 Malignant neoplasm of head of pancreas: Secondary | ICD-10-CM | POA: Insufficient documentation

## 2024-05-09 DIAGNOSIS — Z87891 Personal history of nicotine dependence: Secondary | ICD-10-CM | POA: Insufficient documentation

## 2024-05-09 DIAGNOSIS — K8689 Other specified diseases of pancreas: Secondary | ICD-10-CM | POA: Diagnosis not present

## 2024-05-09 DIAGNOSIS — C787 Secondary malignant neoplasm of liver and intrahepatic bile duct: Secondary | ICD-10-CM | POA: Diagnosis present

## 2024-05-09 DIAGNOSIS — Z79899 Other long term (current) drug therapy: Secondary | ICD-10-CM | POA: Diagnosis not present

## 2024-05-09 LAB — CMP (CANCER CENTER ONLY)
ALT: 24 U/L (ref 0–44)
AST: 27 U/L (ref 15–41)
Albumin: 3.7 g/dL (ref 3.5–5.0)
Alkaline Phosphatase: 69 U/L (ref 38–126)
Anion gap: 8 (ref 5–15)
BUN: 16 mg/dL (ref 8–23)
CO2: 23 mmol/L (ref 22–32)
Calcium: 8.7 mg/dL — ABNORMAL LOW (ref 8.9–10.3)
Chloride: 107 mmol/L (ref 98–111)
Creatinine: 0.81 mg/dL (ref 0.44–1.00)
GFR, Estimated: >60 mL/min (ref >60)
Glucose, Bld: 96 mg/dL (ref 70–99)
Potassium: 3.7 mmol/L (ref 3.5–5.1)
Sodium: 138 mmol/L (ref 135–145)
Total Bilirubin: 0.6 mg/dL (ref 0.0–1.2)
Total Protein: 6.3 g/dL — ABNORMAL LOW (ref 6.5–8.1)

## 2024-05-09 LAB — CBC WITH DIFFERENTIAL (CANCER CENTER ONLY)
Abs Immature Granulocytes: 0.01 K/uL (ref 0.00–0.07)
Basophils Absolute: 0 K/uL (ref 0.0–0.1)
Basophils Relative: 1 %
Eosinophils Absolute: 0.1 K/uL (ref 0.0–0.5)
Eosinophils Relative: 3 %
HCT: 34.8 % — ABNORMAL LOW (ref 36.0–46.0)
Hemoglobin: 11.6 g/dL — ABNORMAL LOW (ref 12.0–15.0)
Immature Granulocytes: 0 %
Lymphocytes Relative: 25 %
Lymphs Abs: 1 K/uL (ref 0.7–4.0)
MCH: 31.9 pg (ref 26.0–34.0)
MCHC: 33.3 g/dL (ref 30.0–36.0)
MCV: 95.6 fL (ref 80.0–100.0)
Monocytes Absolute: 0.3 K/uL (ref 0.1–1.0)
Monocytes Relative: 9 %
Neutro Abs: 2.5 K/uL (ref 1.7–7.7)
Neutrophils Relative %: 62 %
Platelet Count: 158 K/uL (ref 150–400)
RBC: 3.64 MIL/uL — ABNORMAL LOW (ref 3.87–5.11)
RDW: 13.5 % (ref 11.5–15.5)
WBC Count: 4 K/uL (ref 4.0–10.5)
nRBC: 0 % (ref 0.0–0.2)

## 2024-05-09 MED ORDER — SODIUM CHLORIDE 0.9 % IV SOLN
INTRAVENOUS | Status: DC
  Filled 2024-05-09: qty 250

## 2024-05-09 MED ORDER — DIPHENHYDRAMINE HCL 25 MG PO CAPS
25.0000 mg | ORAL_CAPSULE | Freq: Once | ORAL | Status: AC
  Administered 2024-05-09: 25 mg via ORAL
  Filled 2024-05-09: qty 1

## 2024-05-09 MED ORDER — SODIUM CHLORIDE 0.9 % IV SOLN
400.0000 mg/m2 | Freq: Once | INTRAVENOUS | Status: AC
  Administered 2024-05-09: 680 mg via INTRAVENOUS
  Filled 2024-05-09: qty 34

## 2024-05-09 MED ORDER — PALONOSETRON HCL INJECTION 0.25 MG/5ML
0.2500 mg | Freq: Once | INTRAVENOUS | Status: AC
  Administered 2024-05-09: 0.25 mg via INTRAVENOUS
  Filled 2024-05-09: qty 5

## 2024-05-09 MED ORDER — SODIUM CHLORIDE 0.9 % IV SOLN
125.0000 mg/m2 | Freq: Once | INTRAVENOUS | Status: AC
  Administered 2024-05-09: 200 mg via INTRAVENOUS
  Filled 2024-05-09: qty 10

## 2024-05-09 MED ORDER — ATROPINE SULFATE 1 MG/ML IV SOLN
0.5000 mg | Freq: Once | INTRAVENOUS | Status: AC
  Administered 2024-05-09: 0.5 mg via INTRAVENOUS
  Filled 2024-05-09: qty 1

## 2024-05-09 MED ORDER — DIPHENHYDRAMINE HCL 25 MG PO CAPS
50.0000 mg | ORAL_CAPSULE | Freq: Once | ORAL | Status: AC
  Administered 2024-05-09: 50 mg via ORAL
  Filled 2024-05-09: qty 2

## 2024-05-09 MED ORDER — SODIUM CHLORIDE 0.9 % IV SOLN
2400.0000 mg/m2 | INTRAVENOUS | Status: DC
  Administered 2024-05-09: 4100 mg via INTRAVENOUS
  Filled 2024-05-09: qty 82

## 2024-05-09 MED ORDER — DEXAMETHASONE SOD PHOSPHATE PF 10 MG/ML IJ SOLN
10.0000 mg | Freq: Once | INTRAMUSCULAR | Status: AC
  Administered 2024-05-09: 10 mg via INTRAVENOUS

## 2024-05-09 NOTE — Progress Notes (Signed)
 Pt here for follow up. Reports that she had nausea for a few days after last tx.

## 2024-05-09 NOTE — Assessment & Plan Note (Signed)
 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 Continue calcium  supplementation, 2000 mg daily-divided in 2-3 doses IV calcium  gluconate 2g if calcium  <=8.5 Calcium  level has been stable

## 2024-05-09 NOTE — Assessment & Plan Note (Signed)
 Mostly bothering her toes, grade 2.  She did not tolerate Cymbalta .   Stable symptoms She gets acupuncture sessions which help her symptoms.

## 2024-05-09 NOTE — Progress Notes (Signed)
 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  Pathology proved liver metastatic disease- additional chemotherapy --> whipple procedure.ypT1b ypN0--> adjuvant Gem/Abraxane  finished in August 2023--> Sept 2024 CT showed progression in lung --> Nov 2024 Wedge biopsy- Recurrent pancreatic cancer with lung metastasis--> 12/2023 CT stable disease--> 04/03/2024 CT - disease progression -soft tissue close to SMA increased size.  Slightly enlarged lung nodules. Signatera circulating tumor DNA testing- negative.  Currently on 5-FU and Irinotecan . -Irinotecan  dose reduced to 125 mg/m2,  infusion time-180 minutes. Labs are reviewed and discussed with patient. Proceed with cycle 2 5-FU and Irinotecan .    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  She did not tolerate Cymbalta .   Stable symptoms She gets acupuncture sessions which help her symptoms.   Encounter for antineoplastic chemotherapy Chemotherapy plan as listed above  Hypocalcemia Likely due to malabsorption. Continue calcium  supplementation, 2000 mg daily-divided in 2-3 doses IV calcium  gluconate 2g if calcium  <=8.5 Calcium  level has been stable  Pancreatic insufficiency S/p  Whipple procedure..  Continue Creon , continue 24000 units TID with meals and  with her snacks   Chemotherapy-induced nausea Recommend home antiemetics PRN. She has Zofran  and Compazine . Discussed about instructions.      Orders Placed This Encounter  Procedures   Cancer antigen 19-9    Standing Status:   Future    Expected Date:   05/09/2024    Expiration Date:   08/07/2024   CEA    Standing Status:   Future    Expected Date:   05/09/2024    Expiration Date:   08/07/2024   Genetic Screening Order    Standing Status:   Future    Expected Date:   05/30/2024    Expiration Date:   05/09/2025    Follow up  3 weeks lab MD FOLFIRI [ delay due to Thanksgiving schedule]   All questions were answered. The patient knows to [name] the clinic with any problems, questions or concerns.  [name] Babara, MD, PhD [facility] Hematology Oncology 05/09/2024   Cassandra Call, MD    HISTORY OF PRESENTING ILLNESS:   Cassandra [name] is a  68 y.o.  female with presents for follow up of Stage IV pancreatic adenocarcinoma Patient initially presented with jaundice, transaminitis, bilirubin was 9.9.  CA 19-9 was 1874.  Patient also reports unintentional weight loss. 02/08/2020 MRI abdomen and MRCP with and without contrast was done at [facility] which showed pancreatic head mass measuring up to 3 cm, with marked associated narrowing of the portal vein confluence.  SMA is preserved.  Marked intrahepatic and extrahepatic biliary duct dilatation as well as mild dilatation of the main pancreatic duct. Multiple hepatic masses highly concerning for metastatic disease.  Patient underwent EUS on 02/19/2020, which showed irregular mass identified in the pancreatic head, hypoechoic, measured 62mmx33mm, sonographic evidence concerning for invasion into the superior mesenteric artery.  There is no sign of significant abnormality in the main pancreatic duct.  Dilatation of common bile duct which measured up to 16 mm.  Region of celiac artery was visualized and  showed no signs of significant abnormality.  No lymphadenopathy.  FNA showed adenocarcinoma.  02/19/2020, ERCP, malignant.  Biliary stricture was found at the mid/lower third of the medial bile duct with  upstream ductal dilatation.  The stricture was treated with placement of wall flex metal stent.  Patient was seen by [facility] oncology Cassandra [name] and was recommended for 3 drug regimen FOLFIRINOX.  Patient prefers to do chemotherapy locally at [facility].  Patient was referred to establish care today. She denies any pain.  Since stent placement, skin jaundice has improved.  Itchiness has also improved. Patient was accompanied by her husband today.  She has a family history of breast cancer in sister and paternal aunt, colon cancer paternal grandmother.  #No reportable targetable mutation on NGS 9/14/2021cycle 1 FOLFIRINOX.  Patient received oxaliplatin  and about 50% of Irinotecan  on day 1 and had experienced neurologic symptoms.  She went to ER and working diagnosis is TIA, and eventually I think this is due to irinotecan  side effects -irinotecan -associated dysarthria, lip/tongue numbness.  Adjustment was made for Irinotecan  to be  infused over 180 minutes.  Atropine  0.5 mg once prior to the irinotecan . No recurrent symptoms.   # 03/19/2020-03/21/2020 patient was admitted due to sepsis with strep pneumonia bacteremia.  Patient was treated with IV Rocephin .  TEE was done which showed no vegetation.  No PFO or ASD.  Patient was discharged home and he finished full course of 14 days of IV Rocephin  on 04/02/2020 per ID recommendation.  Repeat blood culture was also negative.  08/25/20 cycle 10 FOLFIRINOX 09/08/20- present  Starting cycle 11, FOLFIRI, Oxaliplatin  discontinued due to neuropathy   #NGS showed no reportable targetable mutation #Genetic testing-[employer] diagnostic testing showed no pathological variants identified. MS stable, TMB 0mut/mb, KRAS G12D, SF3B1 K700E, TP53 V244fs*30  #01/14/2021  CT done at [facility] during the interval, stable disease.  # Her case was presented by CassandraJia from [facility] tumor board. Cassandra [name] were 4~5 liver lesions on OSH MRI last year at the time of diagnosis highly suspicious for  metastasis. She is not eligible for surgical protocol for metastasis resection given the number of liver metastasis is >3. However given her excellent and durable response to chemo, surgical resection may be considered pending sustained disease control at 1 year and may require partial hepatectomy first to see if there is viable tumor before proceeding to Whipple resection ]  Patient had a COVID-19 infection in September 2022.  03/23/2021 CT abdomen pelvis No significant change in the ill-defined pancreatic head mass or  associated biliary ductal and pancreatic ductal dilation.  Slight interval enlargement of subcentimeter anterior peripancreatic  lymph node, nonspecific. Attention on follow-up per clinical protocol. Similar enlargement of the ascending thoracic aorta measuring 4.4 cm  03/23/2021 MRI abdomen w and wo  Increasing ill-defined hypoenhancement of the pancreatic head surrounding the common bile duct stent, in keeping with known pancreatic malignancy. Anterior peripancreatic lymph node is better evaluated on CT.  Unchanged position of a common bile duct stent with similar mild diffuse intrahepatic ductal dilation and pneumobilia. No evidence of metastatic disease in the abdomen or pelvis. Partially visualized cystic left adnexal lesion measuring up to 3.9 cm.   04/10/2021, patient underwent liver resection at [facility], by Dr. Zani.   Pathology report from [facility] was reviewed. Segments 3 partial hepatectomy, negative for viable tumor. Section 5/6 partial hepatectomy part 1 and 2, microscopic foci of residual viable adenocarcinoma, morphologically consistent with history of pancreatic primary.  Parenchymal margin is uninvolved.  #Patient  resumed on FOLFIRI on 04/28/2021. She got another cycle on 05/12/2021 #05/26/2021, patient did not get additional chemotherapy due to transaminitis.  Shared decision was made to stop FOLFIRI and switch to gemcitabine  Abraxane  treatments.  05/26/2021, patient  developed transaminitis, AST 763, ALT 691, alkaline phosphatase 364.  Bilirubin 0.7 05/26/2021 stat ultrasound abdomen right upper quadrant showed interval development of 3.3 x 1.8 cm complex mass in the right hepatic lobe.  Increased extrahepatic ductal dilatation.  Concerning for CBD obstruction. 05/29/2021, MRI abdomen MRCP with and without contrast showed unchanged pancreatic head soft tissue.  Severe intra and extrahepatic biliary ductal dilatation.  Common bile duct stents remain in position however patency is not established.  No evidence of lymphadenopathy or metastatic disease in the abdomen.  With further communication with radiologist, addendum was added that there is internal fluid signal and no appreciable associated contrast enhancement measuring 3.4 x 2.3 cm.  This appearance is generally not consistent with metastasis and is of uncertain significance.  Possibly reflecting hepatic abscess or residual of subcapsular hematoma.  Patient was seen by [facility] Dr. Oneal team.  He had ERCP done on 06/05/2021, with findings of One stent from the biliary tree was seen in the major papilla. The stent had migrated significantly into the duodenum. This is the cause of stent malfunction. One stent was removed from the biliary tree. Prior biliary sphincterotomy appeared open. - A single severe biliary stricture was found in the lower third of the main bile duct with upstream dilation. The stricture was alignant appearing.- The biliary tree was swept and sludge was found.- One fully covered metal stent was placed into the common bile duct across the stricture.- No pancreatogram performed.  06/12/2021-08/10/2021 gemcitabine  and Abraxane .   10/14/2021, patient underwent Whipple procedure. Liver biopsy negative for malignancy. Review procedure showed invasive adenocarcinoma, moderately differentiated, centered in pancreatic head and confined in the Pancreas. Surgical margin is negative for malignancy.  36 lymph  nodes were all negative for malignancy.  1 hepatic artery lymph node was harvested and was negative.  Gallbladder negative for malignancy.  Cystic duct excision negative for malignancy. pT1b pN0  10/25/2021, CT abdomen pelvis with contrast showed rim-enhancing fluid collection in the region of the hepatic hilum. Patient had a readmission for superficial wound infection.  She completed antibiotics on 11/03/2021.  11/03/2021, CT abdomen pelvis with contrast showed near resolution of previously seen fluid collection in the region of the hepatic hilum.  Similar appearance of the area of hypoenhancement in the liver.  Attention on follow-up.  11/17/2021, resumed on gemcitabine /Abraxane . 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  12/12/ 2023 CT scan at [facility] shows no evidence of cancer recurrence.-NED 09/21/2022 CT chest abdomen pelvis w contrast at [facility] showed-No evidence of metastatic disease in the chest, abdomen, or pelvis. 12/21/2022 CT chest abdomen pelvis w contrast at [facility] showed-No evidence of metastatic disease in the chest, abdomen, or pelvis.   03/22/2023 CT scan at [facility] showed enlarging lung nodules. RLL 1cm, LLL 0.5cm, RUL 0.5cm Stable hypoattenuating right inferior hepatic lobe lesion measuring 0.9 cm.  Similar left adnexal cystic lesion measuring up to 5.0 cm.   04/06/2023 s/p lung nodule biopsy.  Rare atypical epithelioid cells present with evidence suggestive of invasion, suspicious but not diagnostic for malignancy.   05/20/2023: Right robot assisted thoracoscopic wedge resection with MLND (Dr. Murlean)   Pathology:  Suspicious for malignancy but not diagnostic A. Lymph node, level 8, biopsy: Metastatic carcinoma involving one lymph node (1/1).  B. Lung,  right lower lobe, wedge resection: Lung tissue positive for adenocarcinoma, consistent with metastasis from pancreas primary. See comment. Tumor size: 1.0 cm. Operative margin: Negative  Comment: Note is made of the patient's  history of invasive adenocarcinoma of the pancreatic head ([id number]; 10/14/2021), which is compared to the current case. Comparison is challenging given the post-treatment nature of the pancreatic tumor; however, there are some morphologic similarities.  Although often unhelpful in the setting of lung primary versus pancreatic primary, immunohistochemical stains were performed. Tumor cells demonstrate the below immunoprofile which would could support pulmonary adenocarcinoma with enteric differentiation but would also be consistent with metastasis from the patient's known pancreatic primary; the latter is favored.              Positive                             Negative CK7 (strong, diffuse) TTF-1  CK20 (strong, focal)                    Positive and negative controls perform appropriately.    C. Lymph node, level 7, biopsy: Metastatic carcinoma involving one lymph node (1/1).  D. Lymph node, level 4R, biopsy: One lymph node, negative for malignancy (0/1).   04/03/2024, CT chest abdomen pelvis with contrast showed Enlargement of soft tissue nodule adjacent to the right aspect of the superior mesenteric artery.  Multiple small bilateral pulmonary nodules, slightly enlarged.   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  She gets acupuncture session which helped her symptoms.  INTERVAL HISTORY Cassandra [name] is a 68 y.o. female who has above history reviewed by me today presents for stage IV pancreatic cancer.   Overall she tolerated with FOLFIRI, with moderate difficulties.  For 3-4 days after chemotherapy, patient feels very tired and  on the verge of nausea no vomiting. Symptoms improved.  She takes calcium  supplementation as instructed.  She take Creon  for pancreatic insufficiency  She denies any diarrhea.      Review of Systems  Constitutional:  Positive for fatigue. Negative for appetite change, chills, fever and unexpected  weight change.  HENT:   Negative for hearing loss and voice change.   Eyes:  Negative for eye problems.  Respiratory:  Negative for chest tightness and cough.   Cardiovascular:  Negative for chest pain.  Gastrointestinal:  Negative for abdominal distention, abdominal pain, blood in stool and nausea.  Endocrine: Negative for hot flashes.  Genitourinary:  Negative for difficulty urinating and frequency.   Musculoskeletal:  Positive for arthralgias.  Skin:  Negative for itching and rash.  Neurological:  Positive for numbness. Negative for extremity weakness.  Hematological:  Negative for adenopathy.  Psychiatric/Behavioral:  Negative for confusion.     MEDICAL HISTORY:  Past Medical History:  Diagnosis Date   Allergy    Anemia    Bacteremia due to Streptococcus pneumoniae 03/19/2020   Cancer [facility])    pancreatic cancer   Colon polyps    Family history of breast cancer    Neuropathy due to drug    chemo induced   Neutropenia 04/18/2020   Osteopenia after menopause 05/2017   femoral neck T score -2.0   Personal history of chemotherapy    current for pancreatic ca   PONV (postoperative nausea and vomiting)    Pure hypercholesterolemia    Sepsis (HCC) 03/19/2020    SURGICAL HISTORY:  Past Surgical History:  Procedure Laterality Date   COLONOSCOPY  07/2015   WNL   COLONOSCOPY  2008/2011   COLONOSCOPY WITH PROPOFOL  N/A 07/10/2021   Procedure: COLONOSCOPY WITH PROPOFOL ;  Surgeon: Maryruth Ole DASEN, MD;  Location: [facility] ENDOSCOPY;  Service: Endoscopy;  Laterality: N/A;   IR CV LINE INJECTION  04/28/2021   OVARIAN CYST REMOVAL  1992   dermoid-Dr CAK   PORTA CATH INSERTION N/A 03/07/2020   Procedure: PORTA CATH INSERTION;  Surgeon: Marea Selinda RAMAN, MD;  Location: [facility] INVASIVE CV LAB;  Service: Cardiovascular;  Laterality: N/A;   TEE WITHOUT CARDIOVERSION N/A 03/21/2020   Procedure: TRANSESOPHAGEAL ECHOCARDIOGRAM (TEE);  Surgeon: Fernand Denyse LABOR, MD;  Location: [facility] ORS;  Service:  Cardiovascular;  Laterality: N/A;   TUBAL LIGATION  1993    SOCIAL HISTORY: Social History   Socioeconomic History   Marital status: Married    Spouse name: Not on file   Number of children: 2   Years of education: Not on file   Highest education level: Bachelor's degree (e.g., BA, AB, BS)  Occupational History   Occupation: Runner, Broadcasting/film/video    Comment: retired   Occupation: Visual Merchandiser  Tobacco Use   Smoking status: Former    Current packs/day: 0.00    Average packs/day: 1 pack/day for 10.0 years (10.0 ttl pk-yrs)    Types: Cigarettes    Start date: 06/29/1975    Quit date: 06/28/1985    Years since quitting: 38.8   Smokeless tobacco: Never   Tobacco comments:    Quit smoking 1987  Vaping Use   Vaping status: Never Used  Substance and Sexual Activity   Alcohol use: Yes    Alcohol/week: 5.0 standard drinks of alcohol    Types: 5 Glasses of wine per week    Comment: 0-2 mixed drinks a day   Drug use: No   Sexual activity: Not Currently    Birth control/protection: Post-menopausal  Other Topics Concern   Not on file  Social History Narrative   Not on file   Social Drivers of Health   Financial Resource Strain: Low Risk  (04/07/2024)   Received from [facility]   Overall Financial Resource Strain (CARDIA)    Difficulty of Paying Living Expenses: Not hard at all  Food Insecurity: No Food Insecurity (04/07/2024)   Received from [facility]   Hunger Vital Sign    Within the past 12 months, you worried that your food would run out before you got the money to buy more.: Never true    Within the past 12 months, the food you bought just didn't last and you didn't have money to get more.: Never true  Transportation Needs: No Transportation Needs (04/07/2024)   [facility] - Transportation    In the past 12 months, has lack of transportation kept you from medical appointments or from getting medications?: No     Lack of Transportation (Non-Medical): No  Physical Activity: Sufficiently Active (10/18/2023)   Exercise Vital Sign    Days of Exercise per Week: 6 days    Minutes of Exercise per Session: 30 min  Stress: No Stress Concern Present (10/18/2023)   [employer] of Occupational Health - Occupational Stress Questionnaire    Feeling of Stress : Not at all  Social Connections: Moderately Integrated (10/18/2023)   Social Connection and Isolation Panel    Frequency of Communication with Friends and Family: More than three times a week  Frequency of Social Gatherings with Friends and Family: More than three times a week    Attends Religious Services: More than 4 times per year    Active Member of [employer] or Organizations: No    Attends Banker Meetings: Never    Marital Status: Married  Catering Manager Violence: Not At Risk (10/18/2023)   Humiliation, Afraid, Rape, and Kick questionnaire    Fear of Current or Ex-Partner: No    Emotionally Abused: No    Physically Abused: No    Sexually Abused: No    FAMILY HISTORY: Family History  Problem Relation Age of Onset   Breast cancer Paternal Aunt 35   Diabetes Mother    Osteoporosis Mother    Hyperlipidemia Father    Rheumatic fever Father    Valvular heart disease Father    Cancer Paternal Grandmother        possible colon   Breast cancer Sister 44    ALLERGIES:  is allergic to irinotecan  and penicillin g.  MEDICATIONS:  Current Outpatient Medications  Medication Sig Dispense Refill   b complex vitamins capsule Take 1 capsule by mouth daily.     Calcium  Carbonate-Vit D-Min (CALCIUM  1200 PO) Take by mouth.     Cholecalciferol  25 MCG (1000 UT) tablet Take 2,000 Units by mouth daily.     clobetasol  cream (TEMOVATE ) 0.05 %      fluticasone (FLONASE) 50 MCG/ACT nasal spray Place 1 spray into both nostrils daily.     lidocaine -prilocaine  (EMLA ) cream Apply small amount to port and cover with saran wrap 1-2 hours prior to port  access 30 g 6   loperamide  (IMODIUM ) 2 MG capsule Take 2 tabs by mouth with first loose stool, then 1 tab with each additional loose stool as needed. Do not exceed 8 tabs in a 24-hour period 60 capsule 3   loratadine (CLARITIN REDITABS) 10 MG dissolvable tablet Take 10 mg by mouth daily.     Melatonin 2.5 MG CHEW Chew by mouth.     Multiple Vitamins-Minerals (MULTIVITAMIN WITH MINERALS) tablet Take 1 tablet by mouth daily.     ondansetron  (ZOFRAN ) 8 MG tablet Take 1 tablet (8 mg total) by mouth every 8 (eight) hours as needed for nausea, vomiting or refractory nausea / vomiting. Start on the third day after chemotherapy. 30 tablet 1   Pancrelipase , Lip-Prot-Amyl, (CREON ) 24000-76000 units CPEP TAKE 1 CAPSULE THREE TIMES DAILY BEFORE MEALS 200 capsule 3   prochlorperazine  (COMPAZINE ) 10 MG tablet Take 10 mg by mouth every 6 (six) hours as needed for nausea or vomiting.     prochlorperazine  (COMPAZINE ) 10 MG tablet Take 1 tablet (10 mg total) by mouth every 6 (six) hours as needed for nausea or vomiting. 30 tablet 1   senna-docusate (SENOKOT-S) 8.6-50 MG tablet Take by mouth.     simethicone  (GAS-X) 80 MG chewable tablet Chew 1 tablet (80 mg total) by mouth every 8 (eight) hours as needed for flatulence. 60 tablet 0   No current facility-administered medications for this visit.   Facility-Administered Medications Ordered in Other Visits  Medication Dose Route Frequency Provider Last Rate Last Admin   0.9 %  sodium chloride  infusion   Intravenous Continuous Cassandra Call, MD 10 mL/hr at 05/09/24 1006 New Bag at 05/09/24 1006   diphenhydrAMINE  (BENADRYL ) capsule 25 mg  25 mg Oral Once Fleta Borgeson, MD       fluorouracil  (ADRUCIL ) 4,100 mg in sodium chloride  0.9 % 68 mL chemo infusion  2,400 mg/m2 (  Order-Specific) Intravenous 1 day or 1 dose Cassandra Call, MD       heparin  lock flush 100 UNIT/ML injection            irinotecan  (CAMPTOSAR ) 200 mg in sodium chloride  0.9 % 500 mL chemo infusion  125 mg/m2  (Order-Specific) Intravenous Once Keelen Quevedo, MD 170 mL/hr at 05/09/24 1058 200 mg at 05/09/24 1058   leucovorin  680 mg in sodium chloride  0.9 % 250 mL infusion  400 mg/m2 (Order-Specific) Intravenous Once Cassandra Call, MD 95 mL/hr at 05/09/24 1056 680 mg at 05/09/24 1056   prochlorperazine  (COMPAZINE ) 10 MG tablet            sodium chloride  flush (NS) 0.9 % injection 10 mL  10 mL Intravenous PRN Cassandra Call, MD   10 mL at 02/18/21 0858   sodium chloride  flush (NS) 0.9 % injection 10 mL  10 mL Intravenous Once Borders, Joshua R, NP       sodium chloride  flush (NS) 0.9 % injection 10 mL  10 mL Intravenous Once Cassandra Call, MD         PHYSICAL EXAMINATION: ECOG PERFORMANCE STATUS: 0 - Asymptomatic Vitals:   05/09/24 0907  BP: 124/84  Pulse: 60  Resp: 18  Temp: 97.9 F (36.6 C)   Filed Weights   05/09/24 0907  Weight: 133 lb 12.8 oz (60.7 kg)     Physical Exam Constitutional:      General: She is not in acute distress. HENT:     Head: Normocephalic and atraumatic.  Eyes:     General: No scleral icterus. Cardiovascular:     Rate and Rhythm: Normal rate and regular rhythm.     Heart sounds: Normal heart sounds.  Pulmonary:     Effort: Pulmonary effort is normal. No respiratory distress.  Abdominal:     General: Bowel sounds are normal. There is no distension.     Palpations: Abdomen is soft.  Musculoskeletal:        General: No deformity. Normal range of motion.     Cervical back: Normal range of motion and neck supple.  Skin:    General: Skin is warm.     Coloration: Skin is not jaundiced.     Findings: No erythema.  Neurological:     Mental Status: She is alert and oriented to person, place, and time. Mental status is at baseline.  Psychiatric:        Mood and Affect: Mood normal.     LABORATORY DATA:  I have reviewed the data as listed    Latest Ref Rng & Units 05/09/2024    8:50 AM 04/25/2024    9:20 AM 03/27/2024    8:33 AM  CBC  WBC 4.0 - 10.5 K/uL 4.0  5.3  4.3    Hemoglobin 12.0 - 15.0 g/dL 88.3  87.8  87.7   Hematocrit 36.0 - 46.0 % 34.8  35.2  36.6   Platelets 150 - 400 K/uL 158  197  163       Latest Ref Rng & Units 05/09/2024    8:50 AM 04/25/2024    9:20 AM 03/27/2024    8:33 AM  CMP  Glucose 70 - 99 mg/dL 96  95  860   BUN 8 - 23 mg/dL 16  13  14    Creatinine 0.44 - 1.00 mg/dL 9.18  9.35  9.31   Sodium 135 - 145 mmol/L 138  140  139   Potassium 3.5 - 5.1 mmol/L 3.7  4.2  3.8   Chloride 98 - 111 mmol/L 107  108  107   CO2 22 - 32 mmol/L 23  24  23    Calcium  8.9 - 10.3 mg/dL 8.7  9.0  8.7   Total Protein 6.5 - 8.1 g/dL 6.3  6.6  6.5   Total Bilirubin 0.0 - 1.2 mg/dL 0.6  0.9  0.8   Alkaline Phos 38 - 126 U/L 69  76  79   AST 15 - 41 U/L 27  31  37   ALT 0 - 44 U/L 24  26  32      RADIOGRAPHIC STUDIES: I have personally reviewed the radiological images as listed and agreed with the findings in the report. Reviewed findings of MRI abdomen MRCP done at [facility]. CT CHEST ABDOMEN PELVIS W CONTRAST Result Date: 04/05/2024 CLINICAL DATA:  Pancreatic cancer * Tracking Code: BO * EXAM: CT CHEST, ABDOMEN, AND PELVIS WITH CONTRAST TECHNIQUE: Multidetector CT imaging of the chest, abdomen and pelvis was performed following the standard protocol during bolus administration of intravenous contrast. RADIATION DOSE REDUCTION: This exam was performed according to the departmental dose-optimization program which includes automated exposure control, adjustment of the mA and/or kV according to patient size and/or use of iterative reconstruction technique. CONTRAST:  OMNIPAQUE  IOHEXOL  300 MG/ML  SOLN COMPARISON:  01/02/2024 FINDINGS: CT CHEST FINDINGS Cardiovascular: Right chest port catheter. 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 Normal heart size. No pericardial effusion. Mediastinum/Nodes: No enlarged mediastinal, hilar, or axillary lymph nodes. Thyroid gland, trachea, and  esophagus demonstrate no significant findings. Lungs/Pleura: Unchanged wedge resection of the dependent right lower lobe. Multiple small bilateral pulmonary nodules, nodule of the dependent left lower lobe is slightly enlarged, measuring 0.8 x 0.5 cm, previously no greater than 0.5 cm (series 4, image 78). Multiple additional small nodules unchanged, for example a 0.5 cm nodule of the anterior right upper lobe (series 4, image 76). No new nodules. No pleural effusion or pneumothorax. Musculoskeletal: No chest wall abnormality. No acute osseous findings. CT ABDOMEN PELVIS FINDINGS Hepatobiliary: No focal liver abnormality is seen. Status post cholecystectomy and hepaticojejunostomy. No biliary dilatation. 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 Interval enlargement of soft tissue nodule adjacent to the right aspect of the superior mesenteric artery, measuring 1.9 x 1.6 cm, previously 1.0 x 1.0 cm (series 2, image 76). Spleen: Normal in size without significant abnormality. Adrenals/Urinary Tract: Adrenal glands are unremarkable. Kidneys are normal, without renal calculi, solid lesion, or hydronephrosis. Bladder is unremarkable. Stomach/Bowel: Stomach is within normal limits. Appendix appears normal. No evidence of bowel wall thickening, distention, or inflammatory changes. Vascular/Lymphatic: Aortic atherosclerosis. No enlarged abdominal or pelvic lymph nodes. Reproductive: No solid mass or other abnormality. Unchanged left ovarian cyst measuring 5.4 x 3.6 cm (series 2, image 104). Other: No abdominal wall hernia or abnormality. No ascites. Musculoskeletal: No acute osseous findings. IMPRESSION: 1. Status post Whipple procedure. 2. Interval enlargement of soft tissue nodule adjacent to the right aspect of the superior mesenteric artery, consistent with worsened locally recurrent disease. 3. Multiple small bilateral pulmonary nodules, nodule of the dependent left  lower lobe is slightly enlarged, measuring 0.8 x 0.5 cm, previously no greater than 0.5 cm. Multiple additional small nodules unchanged. No new nodules. 4. 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 Aortic Atherosclerosis (ICD10-I70.0). Electronically Signed   By: Marolyn JONETTA Jaksch  M.D.   On: 04/05/2024 20:58

## 2024-05-09 NOTE — Assessment & Plan Note (Signed)
 Recommend home antiemetics PRN. She has Zofran  and Compazine . Discussed about instructions.

## 2024-05-09 NOTE — Assessment & Plan Note (Signed)
 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  Pathology proved liver metastatic disease- additional chemotherapy --> whipple procedure.ypT1b ypN0--> adjuvant Gem/Abraxane  finished in August 2023--> Sept 2024 CT showed progression in lung --> Nov 2024 Wedge biopsy- Recurrent pancreatic cancer with lung metastasis--> 12/2023 CT stable disease--> 04/03/2024 CT - disease progression -soft tissue close to SMA increased size.  Slightly enlarged lung nodules. Signatera circulating tumor DNA testing- negative.  Currently on 5-FU and Irinotecan . -Irinotecan  dose reduced to 125 mg/m2,  infusion time-180 minutes. Labs are reviewed and discussed with patient. Proceed with cycle 2 5-FU and Irinotecan .

## 2024-05-09 NOTE — Assessment & Plan Note (Signed)
 Chemotherapy plan as listed above

## 2024-05-09 NOTE — Patient Instructions (Signed)
 [facility] - A DEPT OF [facility]  Discharge Instructions: Thank you for choosing [facility] to provide your oncology and hematology care.  If you have a lab appointment with the Cancer Center, please go directly to the Cancer Center and check in at the registration area.  Wear comfortable clothing and clothing appropriate for easy access to any Portacath or PICC line.   We strive to give you quality time with your provider. You may need to reschedule your appointment if you arrive late (15 or more minutes).  Arriving late affects you and other patients whose appointments are after yours.  Also, if you miss three or more appointments without notifying the office, you may be dismissed from the clinic at the provider's discretion.      For prescription refill requests, have your pharmacy contact our office and allow 72 hours for refills to be completed.    Today you received the following chemotherapy and/or immunotherapy agents- irinotecan, leucovorin, 5FU      To help prevent nausea and vomiting after your treatment, we encourage you to take your nausea medication as directed.  BELOW ARE SYMPTOMS THAT SHOULD BE REPORTED IMMEDIATELY: *FEVER GREATER THAN 100.4 F (38 C) OR HIGHER *CHILLS OR SWEATING *NAUSEA AND VOMITING THAT IS NOT CONTROLLED WITH YOUR NAUSEA MEDICATION *UNUSUAL SHORTNESS OF BREATH *UNUSUAL BRUISING OR BLEEDING *URINARY PROBLEMS (pain or burning when urinating, or frequent urination) *BOWEL PROBLEMS (unusual diarrhea, constipation, pain near the anus) TENDERNESS IN MOUTH AND THROAT WITH OR WITHOUT PRESENCE OF ULCERS (sore throat, sores in mouth, or a toothache) UNUSUAL RASH, SWELLING OR PAIN  UNUSUAL VAGINAL DISCHARGE OR ITCHING   Items with * indicate a potential emergency and should be followed up as soon as possible or go to the Emergency Department if any problems should occur.  Please show the CHEMOTHERAPY ALERT CARD  or IMMUNOTHERAPY ALERT CARD at check-in to the Emergency Department and triage nurse.  Should you have questions after your visit or need to cancel or reschedule your appointment, please contact [facility] - A DEPT OF [facility]  [phone] and follow the prompts.  Office hours are 8:00 a.m. to 4:30 p.m. Monday - Friday. Please note that voicemails left after 4:00 p.m. may not be returned until the following business day.  We are closed weekends and major holidays. You have access to a nurse at all times for urgent questions. Please call the main number to the clinic [phone] and follow the prompts.  For any non-urgent questions, you may also contact your provider using MyChart. We now offer e-Visits for anyone 64 and older to request care online for non-urgent symptoms. For details visit [email or web address].   Also download the MyChart app! Go to the app store, search "MyChart", open the app, select [facility], and log in with your MyChart username and password.

## 2024-05-09 NOTE — Assessment & Plan Note (Signed)
 S/p  Whipple procedure..  Continue Creon , continue 24000 units TID with meals and  with her snacks

## 2024-05-10 LAB — CEA: CEA: 4.6 ng/mL (ref 0.0–4.7)

## 2024-05-10 LAB — CANCER ANTIGEN 19-9: CA 19-9: 31 U/mL (ref 0–35)

## 2024-05-11 ENCOUNTER — Inpatient Hospital Stay

## 2024-05-15 ENCOUNTER — Ambulatory Visit: Admitting: Oncology

## 2024-05-15 ENCOUNTER — Ambulatory Visit

## 2024-05-15 ENCOUNTER — Other Ambulatory Visit

## 2024-05-21 ENCOUNTER — Encounter: Payer: Self-pay | Admitting: Oncology

## 2024-05-29 ENCOUNTER — Other Ambulatory Visit

## 2024-05-29 ENCOUNTER — Ambulatory Visit: Admitting: Oncology

## 2024-05-29 ENCOUNTER — Ambulatory Visit

## 2024-05-30 ENCOUNTER — Inpatient Hospital Stay: Attending: Oncology | Admitting: Oncology

## 2024-05-30 ENCOUNTER — Encounter: Payer: Self-pay | Admitting: Oncology

## 2024-05-30 ENCOUNTER — Inpatient Hospital Stay

## 2024-05-30 ENCOUNTER — Inpatient Hospital Stay: Attending: Oncology

## 2024-05-30 VITALS — BP 130/84 | HR 59 | Temp 98.1°F | Resp 18 | Wt 133.5 lb

## 2024-05-30 VITALS — BP 147/80 | HR 58

## 2024-05-30 DIAGNOSIS — Z87891 Personal history of nicotine dependence: Secondary | ICD-10-CM | POA: Insufficient documentation

## 2024-05-30 DIAGNOSIS — C787 Secondary malignant neoplasm of liver and intrahepatic bile duct: Secondary | ICD-10-CM | POA: Diagnosis present

## 2024-05-30 DIAGNOSIS — R110 Nausea: Secondary | ICD-10-CM | POA: Diagnosis not present

## 2024-05-30 DIAGNOSIS — K8689 Other specified diseases of pancreas: Secondary | ICD-10-CM

## 2024-05-30 DIAGNOSIS — C250 Malignant neoplasm of head of pancreas: Secondary | ICD-10-CM | POA: Diagnosis present

## 2024-05-30 DIAGNOSIS — C259 Malignant neoplasm of pancreas, unspecified: Secondary | ICD-10-CM | POA: Diagnosis not present

## 2024-05-30 DIAGNOSIS — Z5111 Encounter for antineoplastic chemotherapy: Secondary | ICD-10-CM | POA: Diagnosis present

## 2024-05-30 DIAGNOSIS — C7800 Secondary malignant neoplasm of unspecified lung: Secondary | ICD-10-CM | POA: Diagnosis not present

## 2024-05-30 DIAGNOSIS — Z452 Encounter for adjustment and management of vascular access device: Secondary | ICD-10-CM | POA: Diagnosis not present

## 2024-05-30 DIAGNOSIS — T451X5A Adverse effect of antineoplastic and immunosuppressive drugs, initial encounter: Secondary | ICD-10-CM

## 2024-05-30 LAB — CBC WITH DIFFERENTIAL (CANCER CENTER ONLY)
Abs Immature Granulocytes: 0.01 K/uL (ref 0.00–0.07)
Basophils Absolute: 0 K/uL (ref 0.0–0.1)
Basophils Relative: 1 %
Eosinophils Absolute: 0.1 K/uL (ref 0.0–0.5)
Eosinophils Relative: 2 %
HCT: 34.4 % — ABNORMAL LOW (ref 36.0–46.0)
Hemoglobin: 11.9 g/dL — ABNORMAL LOW (ref 12.0–15.0)
Immature Granulocytes: 0 %
Lymphocytes Relative: 30 %
Lymphs Abs: 1 K/uL (ref 0.7–4.0)
MCH: 32.7 pg (ref 26.0–34.0)
MCHC: 34.6 g/dL (ref 30.0–36.0)
MCV: 94.5 fL (ref 80.0–100.0)
Monocytes Absolute: 0.4 K/uL (ref 0.1–1.0)
Monocytes Relative: 11 %
Neutro Abs: 1.9 K/uL (ref 1.7–7.7)
Neutrophils Relative %: 56 %
Platelet Count: 186 K/uL (ref 150–400)
RBC: 3.64 MIL/uL — ABNORMAL LOW (ref 3.87–5.11)
RDW: 13.6 % (ref 11.5–15.5)
WBC Count: 3.4 K/uL — ABNORMAL LOW (ref 4.0–10.5)
nRBC: 0 % (ref 0.0–0.2)

## 2024-05-30 LAB — CMP (CANCER CENTER ONLY)
ALT: 24 U/L (ref 0–44)
AST: 29 U/L (ref 15–41)
Albumin: 4.1 g/dL (ref 3.5–5.0)
Alkaline Phosphatase: 92 U/L (ref 38–126)
Anion gap: 11 (ref 5–15)
BUN: 15 mg/dL (ref 8–23)
CO2: 23 mmol/L (ref 22–32)
Calcium: 9 mg/dL (ref 8.9–10.3)
Chloride: 107 mmol/L (ref 98–111)
Creatinine: 0.72 mg/dL (ref 0.44–1.00)
GFR, Estimated: >60 mL/min (ref >60)
Glucose, Bld: 100 mg/dL — ABNORMAL HIGH (ref 70–99)
Potassium: 3.9 mmol/L (ref 3.5–5.1)
Sodium: 141 mmol/L (ref 135–145)
Total Bilirubin: 0.4 mg/dL (ref 0.0–1.2)
Total Protein: 6.2 g/dL — ABNORMAL LOW (ref 6.5–8.1)

## 2024-05-30 LAB — GENETIC SCREENING ORDER

## 2024-05-30 MED ORDER — PROMETHAZINE HCL 25 MG PO TABS: 25.0000 mg | ORAL_TABLET | Freq: Four times a day (QID) | ORAL | 1 refills | Status: AC | PRN

## 2024-05-30 MED ORDER — ATROPINE SULFATE 1 MG/ML IV SOLN
0.5000 mg | Freq: Once | INTRAVENOUS | Status: AC
  Administered 2024-05-30: 0.5 mg via INTRAVENOUS
  Filled 2024-05-30: qty 1

## 2024-05-30 MED ORDER — DEXAMETHASONE SOD PHOSPHATE PF 10 MG/ML IJ SOLN
10.0000 mg | Freq: Once | INTRAMUSCULAR | Status: AC
  Administered 2024-05-30: 10 mg via INTRAVENOUS

## 2024-05-30 MED ORDER — SODIUM CHLORIDE 0.9 % IV SOLN
400.0000 mg/m2 | Freq: Once | INTRAVENOUS | Status: AC
  Administered 2024-05-30: 680 mg via INTRAVENOUS
  Filled 2024-05-30: qty 34

## 2024-05-30 MED ORDER — SODIUM CHLORIDE 0.9 % IV SOLN
2400.0000 mg/m2 | INTRAVENOUS | Status: DC
  Administered 2024-05-30: 4100 mg via INTRAVENOUS
  Filled 2024-05-30: qty 82

## 2024-05-30 MED ORDER — SODIUM CHLORIDE 0.9 % IV SOLN
125.0000 mg/m2 | Freq: Once | INTRAVENOUS | Status: AC
  Administered 2024-05-30: 200 mg via INTRAVENOUS
  Filled 2024-05-30: qty 10

## 2024-05-30 MED ORDER — DIPHENHYDRAMINE HCL 25 MG PO CAPS: 25.0000 mg | ORAL_CAPSULE | Freq: Once | ORAL | Status: DC

## 2024-05-30 MED ORDER — DIPHENHYDRAMINE HCL 25 MG PO TABS
50.0000 mg | ORAL_TABLET | Freq: Once | ORAL | Status: AC
  Administered 2024-05-30: 50 mg via ORAL
  Filled 2024-05-30: qty 2

## 2024-05-30 MED ORDER — LORAZEPAM 0.5 MG PO TABS: 0.5000 mg | ORAL_TABLET | Freq: Three times a day (TID) | ORAL | 0 refills | Status: AC | PRN

## 2024-05-30 MED ORDER — SODIUM CHLORIDE 0.9 % IV SOLN
INTRAVENOUS | Status: DC
  Filled 2024-05-30: qty 250

## 2024-05-30 MED ORDER — PALONOSETRON HCL INJECTION 0.25 MG/5ML
0.2500 mg | Freq: Once | INTRAVENOUS | Status: AC
  Administered 2024-05-30: 0.25 mg via INTRAVENOUS
  Filled 2024-05-30: qty 5

## 2024-05-30 NOTE — Assessment & Plan Note (Signed)
 S/p  Whipple procedure..  Continue Creon , continue 24000 units TID with meals and  with her snacks

## 2024-05-30 NOTE — Assessment & Plan Note (Signed)
 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  Pathology proved liver metastatic disease- additional chemotherapy --> whipple procedure.ypT1b ypN0--> adjuvant Gem/Abraxane  finished in August 2023--> Sept 2024 CT showed progression in lung --> Nov 2024 Wedge biopsy- Recurrent pancreatic cancer with lung metastasis--> 12/2023 CT stable disease--> 04/03/2024 CT - disease progression -soft tissue close to SMA increased size.  Slightly enlarged lung nodules. Signatera circulating tumor DNA testing- negative.  Currently on 5-FU and Irinotecan . -Irinotecan  dose reduced to 125 mg/m2,  infusion time-180 minutes. Labs are reviewed and discussed with patient. Proceed with cycle 3 5-FU and Irinotecan .

## 2024-05-30 NOTE — Assessment & Plan Note (Signed)
 Recommend home antiemetics PRN. She has Zofran  and Compazine . Compazine  is not effective.  Recommend her to try Phenergan 25mg  Q6h PRN Also add Lorazepem 0.5mg  Q8h PRN for nausea, anxiety, sleep

## 2024-05-30 NOTE — Progress Notes (Signed)
 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  Pathology proved liver metastatic disease- additional chemotherapy --> whipple procedure.ypT1b ypN0--> adjuvant Gem/Abraxane  finished in August 2023--> Sept 2024 CT showed progression in lung --> Nov 2024 Wedge biopsy- Recurrent pancreatic cancer with lung metastasis--> 12/2023 CT stable disease--> 04/03/2024 CT - disease progression -soft tissue close to SMA increased size.  Slightly enlarged lung nodules. Signatera circulating tumor DNA testing- negative.  Currently on 5-FU and Irinotecan . -Irinotecan  dose reduced to 125 mg/m2,  infusion time-180 minutes. Labs are reviewed and discussed with patient. Proceed with cycle 3 5-FU and Irinotecan .    Chemotherapy-induced nausea Recommend home antiemetics PRN. She has Zofran  and Compazine . Compazine  is not effective.  Recommend her to try Phenergan  25mg  Q6h PRN Also add Lorazepem 0.5mg  Q8h PRN for nausea, anxiety, sleep  Encounter for antineoplastic chemotherapy Chemotherapy plan as listed above  Hypocalcemia Likely due to malabsorption. Continue calcium  supplementation, 2000 mg daily-divided in 2-3 doses IV calcium  gluconate 2g if calcium  <=8.5 Calcium  level has been stable  Pancreatic insufficiency S/p  Whipple procedure..  Continue Creon , continue 24000 units TID with meals and  with her snacks     Orders Placed This Encounter  Procedures   CEA    Standing Status:    Future    Expected Date:   07/02/2024    Expiration Date:   07/02/2025   Cancer antigen 19-9    Standing Status:   Future    Expected Date:   07/02/2024    Expiration Date:   07/02/2025   CBC with Differential (Cancer Center Only)    Standing Status:   Future    Expected Date:   07/02/2024    Expiration Date:   07/02/2025   CMP (Cancer Center only)    Standing Status:   Future    Expected Date:   07/02/2024    Expiration Date:   07/02/2025   CEA    Standing Status:   Future    Expected Date:   07/16/2024    Expiration Date:   07/16/2025   Cancer antigen 19-9    Standing Status:   Future    Expected Date:   07/16/2024    Expiration Date:   07/16/2025   CBC with Differential (Cancer Center Only)    Standing Status:   Future    Expected Date:   07/16/2024    Expiration Date:   07/16/2025   CMP (Cancer Center only)    Standing Status:   Future    Expected Date:   07/16/2024    Expiration Date:   07/16/2025    Follow up  2 weeks lab MD FOLFIRI  All questions were answered. The patient knows to call the clinic with any problems, questions or concerns.  Call Babara, MD, PhD [facility] Hematology Oncology 05/30/2024   Babara Call, MD    HISTORY OF PRESENTING ILLNESS:   Cassandra [name] is a  68 y.o.  female with presents for follow up of Stage IV pancreatic adenocarcinoma Patient initially presented with jaundice, transaminitis, bilirubin was 9.9.  CA 19-9 was 1874.  Patient also reports unintentional weight loss. 02/08/2020 MRI abdomen and MRCP with and without contrast was done at [facility] which showed pancreatic head mass measuring up to 3 cm, with marked associated narrowing of the portal vein confluence.  SMA is preserved.  Marked intrahepatic and extrahepatic biliary duct dilatation as well as mild dilatation of the main pancreatic duct. Multiple hepatic masses highly concerning for metastatic disease.  Patient underwent EUS on 02/19/2020, which showed irregular mass identified in the pancreatic head,  hypoechoic, measured 40mmx33mm, sonographic evidence concerning for invasion into the superior mesenteric artery.  There is no sign of significant abnormality in the main pancreatic duct.  Dilatation of common bile duct which measured up to 16 mm.  Region of celiac artery was visualized and showed no signs of significant abnormality.  No lymphadenopathy.  FNA showed adenocarcinoma.  02/19/2020, ERCP, malignant.  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  The stricture was treated with placement of wall flex metal stent.  Patient was seen by [facility] oncology Dr. [name] and was recommended for 3 drug regimen FOLFIRINOX.  Patient prefers to do chemotherapy locally at [facility].  Patient was referred to establish care today. She denies any pain.  Since stent placement, skin jaundice has improved.  Itchiness has also improved. Patient was accompanied by her husband today.  She has a family history of breast cancer in sister and paternal aunt, colon cancer paternal grandmother.  #No reportable targetable mutation on NGS 9/14/2021cycle 1 FOLFIRINOX.  Patient received oxaliplatin  and about 50% of Irinotecan  on day 1 and had experienced neurologic symptoms.  She went to ER and working diagnosis is TIA, and eventually I think this is due to irinotecan  side effects -irinotecan -associated dysarthria, lip/tongue numbness.  Adjustment was made for Irinotecan  to be  infused over 180 minutes.  Atropine  0.5 mg once prior to the irinotecan . No recurrent symptoms.   # 03/19/2020-03/21/2020 patient was admitted due to sepsis with strep pneumonia bacteremia.  Patient was treated with IV Rocephin .  TEE was done which showed no vegetation.  No PFO or ASD.  Patient was discharged home and he finished full course of 14 days of IV Rocephin  on 04/02/2020 per ID recommendation.  Repeat blood culture was also negative.  08/25/20 cycle 10 FOLFIRINOX 09/08/20- present  Starting cycle  11, FOLFIRI, Oxaliplatin  discontinued due to neuropathy   #NGS showed no reportable targetable mutation #Genetic testing-[employer] diagnostic testing showed no pathological variants identified. MS stable, TMB 0mut/mb, KRAS G12D, SF3B1 K700E, TP53 V21fs*30  #01/14/2021  CT done at [facility] during the interval, stable disease.  # Her case was presented by Dr.Jia from [facility] tumor board. [name] were 4~5 liver lesions on OSH MRI last year at the time of diagnosis highly suspicious for metastasis. She is not eligible for surgical protocol for metastasis resection given the number of liver metastasis is >3. However given her excellent and durable response to chemo, surgical resection may be considered pending sustained disease control at 1 year and may require partial hepatectomy first to see if there is viable tumor before proceeding to Whipple resection ]  Patient had a COVID-19 infection in September 2022.  03/23/2021 CT abdomen pelvis No significant change in the ill-defined pancreatic head mass or  associated biliary ductal and pancreatic ductal dilation.  Slight interval enlargement of subcentimeter anterior peripancreatic  lymph node, nonspecific. Attention on follow-up per clinical protocol. Similar enlargement of the ascending thoracic aorta  measuring 4.4 cm  03/23/2021 MRI abdomen w and wo  Increasing ill-defined hypoenhancement of the pancreatic head surrounding the common bile duct stent, in keeping with known pancreatic malignancy. Anterior peripancreatic lymph node is better evaluated on CT.  Unchanged position of a common bile duct stent with similar mild diffuse intrahepatic ductal dilation and pneumobilia. No evidence of metastatic disease in the abdomen or pelvis. Partially visualized cystic left adnexal lesion measuring up to 3.9 cm.   04/10/2021, patient underwent liver resection at [facility], by Dr. Zani.   Pathology report from [facility] was reviewed. Segments 3 partial hepatectomy, negative for  viable tumor. Section 5/6 partial hepatectomy part 1 and 2, microscopic foci of residual viable adenocarcinoma, morphologically consistent with history of pancreatic primary.  Parenchymal margin is uninvolved.  #Patient resumed on FOLFIRI on 04/28/2021. She got another cycle on 05/12/2021 #05/26/2021, patient did not get additional chemotherapy due to transaminitis.  Shared decision was made to stop FOLFIRI and switch to gemcitabine  Abraxane  treatments.  05/26/2021, patient developed transaminitis, AST 763, ALT 691, alkaline phosphatase 364.  Bilirubin 0.7 05/26/2021 stat ultrasound abdomen right upper quadrant showed interval development of 3.3 x 1.8 cm complex mass in the right hepatic lobe.  Increased extrahepatic ductal dilatation.  Concerning for CBD obstruction. 05/29/2021, MRI abdomen MRCP with and without contrast showed unchanged pancreatic head soft tissue.  Severe intra and extrahepatic biliary ductal dilatation.  Common bile duct stents remain in position however patency is not established.  No evidence of lymphadenopathy or metastatic disease in the abdomen.  With further communication with radiologist, addendum was added that there is internal fluid signal and no appreciable associated contrast enhancement measuring 3.4 x 2.3 cm.  This appearance is generally not consistent with metastasis and is of uncertain significance.  Possibly reflecting hepatic abscess or residual of subcapsular hematoma.  Patient was seen by [facility] Dr. Oneal team.  He had ERCP done on 06/05/2021, with findings of One stent from the biliary tree was seen in the major papilla. The stent had migrated significantly into the duodenum. This is the cause of stent malfunction. One stent was removed from the biliary tree. Prior biliary sphincterotomy appeared open. - A single severe biliary stricture was found in the lower third of the main bile duct with upstream dilation. The stricture was alignant appearing.- The biliary tree  was swept and sludge was found.- One fully covered metal stent was placed into the common bile duct across the stricture.- No pancreatogram performed.  06/12/2021-08/10/2021 gemcitabine  and Abraxane .   10/14/2021, patient underwent Whipple procedure. Liver biopsy negative for malignancy. Review procedure showed invasive adenocarcinoma, moderately differentiated, centered in pancreatic head and confined in the Pancreas. Surgical margin is negative for malignancy.  36 lymph nodes were all negative for malignancy.  1 hepatic artery lymph node was harvested and was negative.  Gallbladder negative for malignancy.  Cystic duct excision negative for malignancy. pT1b pN0  10/25/2021, CT abdomen pelvis with contrast showed rim-enhancing fluid collection in the region of the hepatic hilum. Patient had a readmission for superficial wound infection.  She completed antibiotics on 11/03/2021.  11/03/2021, CT abdomen pelvis with contrast showed near resolution of previously seen fluid collection in the region of the hepatic hilum.  Similar appearance of the area of hypoenhancement in the liver.  Attention on follow-up.  11/17/2021, resumed on gemcitabine /Abraxane . 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  12/12/ 2023 CT scan at [facility] shows no evidence of cancer recurrence.-NED 09/21/2022 CT chest abdomen pelvis w contrast  at [facility] showed-No evidence of metastatic disease in the chest, abdomen, or pelvis. 12/21/2022 CT chest abdomen pelvis w contrast at [facility] showed-No evidence of metastatic disease in the chest, abdomen, or pelvis.   03/22/2023 CT scan at [facility] showed enlarging lung nodules. RLL 1cm, LLL 0.5cm, RUL 0.5cm Stable hypoattenuating right inferior hepatic lobe lesion measuring 0.9 cm.  Similar left adnexal cystic lesion measuring up to 5.0 cm.   04/06/2023 s/p lung nodule biopsy.  Rare atypical epithelioid cells present with evidence suggestive of invasion, suspicious but not diagnostic for malignancy.    05/20/2023: Right robot assisted thoracoscopic wedge resection with MLND (Dr. Murlean)   Pathology:  Suspicious for malignancy but not diagnostic A. Lymph node, level 8, biopsy: Metastatic carcinoma involving one lymph node (1/1).  B. Lung, right lower lobe, wedge resection: Lung tissue positive for adenocarcinoma, consistent with metastasis from pancreas primary. See comment. Tumor size: 1.0 cm. Operative margin: Negative  Comment: Note is made of the patient's history of invasive adenocarcinoma of the pancreatic head ([id number]; 10/14/2021), which is compared to the current case. Comparison is challenging given the post-treatment nature of the pancreatic tumor; however, there are some morphologic similarities.  Although often unhelpful in the setting of lung primary versus pancreatic primary, immunohistochemical stains were performed. Tumor cells demonstrate the below immunoprofile which would could support pulmonary adenocarcinoma with enteric differentiation but would also be consistent with metastasis from the patient's known pancreatic primary; the latter is favored.              Positive                             Negative CK7 (strong, diffuse) TTF-1  CK20 (strong, focal)                    Positive and negative controls perform appropriately.    C. Lymph node, level 7, biopsy: Metastatic carcinoma involving one lymph node (1/1).  D. Lymph node, level 4R, biopsy: One lymph node, negative for malignancy (0/1).   04/03/2024, CT chest abdomen pelvis with contrast showed Enlargement of soft tissue nodule adjacent to the right aspect of the superior mesenteric artery.  Multiple small bilateral pulmonary nodules, slightly enlarged.   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  She gets acupuncture session which helped her symptoms.  INTERVAL HISTORY Cassandra [name] is a 68 y.o. female who has above history reviewed by me today presents  for stage IV pancreatic cancer.   Overall she tolerated with FOLFIRI, with moderate difficulties.  For 3 days after chemotherapy, patient reports feeling very nauseated,  Symptoms improved after 3 days.  She takes calcium  supplementation as instructed.  She take Creon  for pancreatic insufficiency  She denies abdominal pain diarrhea.      Review of Systems  Constitutional:  Positive for fatigue. Negative for appetite change, chills, fever and unexpected weight change.  HENT:   Negative for hearing loss and voice change.   Eyes:  Negative for eye problems.  Respiratory:  Negative for chest tightness and cough.   Cardiovascular:  Negative for chest pain.  Gastrointestinal:  Negative for abdominal distention, abdominal pain, blood in stool and nausea.  Endocrine: Negative for hot flashes.  Genitourinary:  Negative for difficulty urinating and frequency.   Musculoskeletal:  Positive for arthralgias.  Skin:  Negative for itching and rash.  Neurological:  Positive for numbness.  Negative for extremity weakness.  Hematological:  Negative for adenopathy.  Psychiatric/Behavioral:  Negative for confusion.     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   Neutropenia 04/18/2020   Osteopenia after menopause 05/2017   femoral neck T score -2.0   Personal history of chemotherapy    current for pancreatic ca   PONV (postoperative nausea and vomiting)    Pure hypercholesterolemia    Sepsis (HCC) 03/19/2020    SURGICAL HISTORY: Past Surgical History:  Procedure Laterality Date   COLONOSCOPY  07/2015   WNL   COLONOSCOPY  2008/2011   COLONOSCOPY WITH PROPOFOL  N/A 07/10/2021   Procedure: COLONOSCOPY WITH PROPOFOL ;  Surgeon: Maryruth Ole DASEN, MD;  Location: [facility]  ENDOSCOPY;  Service: Endoscopy;  Laterality: N/A;   IR CV LINE INJECTION  04/28/2021   OVARIAN CYST REMOVAL  1992   dermoid-Dr CAK   PORTA CATH INSERTION N/A 03/07/2020   Procedure: PORTA CATH INSERTION;  Surgeon: Marea Selinda RAMAN, MD;  Location: [facility] INVASIVE CV LAB;  Service: Cardiovascular;  Laterality: N/A;   TEE WITHOUT CARDIOVERSION N/A 03/21/2020   Procedure: TRANSESOPHAGEAL ECHOCARDIOGRAM (TEE);  Surgeon: Fernand Denyse LABOR, MD;  Location: [facility] ORS;  Service: Cardiovascular;  Laterality: N/A;   TUBAL LIGATION  1993    SOCIAL HISTORY: Social History   Socioeconomic History   Marital status: Married    Spouse name: Not on file   Number of children: 2   Years of education: Not on file   Highest education level: Bachelor's degree (e.g., BA, AB, BS)  Occupational History   Occupation: Runner, Broadcasting/film/video    Comment: retired   Occupation: Visual Merchandiser  Tobacco Use   Smoking status: Former    Current packs/day: 0.00    Average packs/day: 1 pack/day for 10.0 years (10.0 ttl pk-yrs)    Types: Cigarettes    Start date: 06/29/1975    Quit date: 06/28/1985    Years since quitting: 38.9   Smokeless tobacco: Never   Tobacco comments:    Quit smoking 1987  Vaping Use   Vaping status: Never Used  Substance and Sexual Activity   Alcohol use: Yes    Alcohol/week: 5.0 standard drinks of alcohol    Types: 5 Glasses of wine per week    Comment: 0-2 mixed drinks a day   Drug use: No   Sexual activity: Not Currently    Birth control/protection: Post-menopausal  Other Topics Concern   Not on file  Social History Narrative   Not on file   Social Drivers of Health   Financial Resource Strain: Low Risk  (04/07/2024)   Received from [facility]   Overall Financial Resource Strain (CARDIA)    Difficulty of Paying Living Expenses: Not hard at all  Food Insecurity: No Food Insecurity (04/07/2024)   Received from [facility]   Hunger Vital Sign    Within the past 12 months,  you worried that your food would run out before you got the money to buy more.: Never true    Within the past 12 months, the food you bought just didn't last and you didn't have money to get more.: Never true  Transportation Needs: No Transportation Needs (04/07/2024)  Received from [facility] - Transportation    In the past 12 months, has lack of transportation kept you from medical appointments or from getting medications?: No    Lack of Transportation (Non-Medical): No  Physical Activity: Sufficiently Active (10/18/2023)   Exercise Vital Sign    Days of Exercise per Week: 6 days    Minutes of Exercise per Session: 30 min  Stress: No Stress Concern Present (10/18/2023)   [employer] of Occupational Health - Occupational Stress Questionnaire    Feeling of Stress : Not at all  Social Connections: Moderately Integrated (10/18/2023)   Social Connection and Isolation Panel    Frequency of Communication with Friends and Family: More than three times a week    Frequency of Social Gatherings with Friends and Family: More than three times a week    Attends Religious Services: More than 4 times per year    Active Member of [employer] or Organizations: No    Attends Banker Meetings: Never    Marital Status: Married  Catering Manager Violence: Not At Risk (10/18/2023)   Humiliation, Afraid, Rape, and Kick questionnaire    Fear of Current or Ex-Partner: No    Emotionally Abused: No    Physically Abused: No    Sexually Abused: No    FAMILY HISTORY: Family History  Problem Relation Age of Onset   Breast cancer Paternal Aunt 74   Diabetes Mother    Osteoporosis Mother    Hyperlipidemia Father    Rheumatic fever Father    Valvular heart disease Father    Cancer Paternal Grandmother        possible colon   Breast cancer Sister 84    ALLERGIES:  is allergic to irinotecan  and penicillin g.  MEDICATIONS:  Current Outpatient Medications  Medication  Sig Dispense Refill   b complex vitamins capsule Take 1 capsule by mouth daily.     Calcium  Carbonate-Vit D-Min (CALCIUM  1200 PO) Take by mouth.     Cholecalciferol  25 MCG (1000 UT) tablet Take 2,000 Units by mouth daily.     clobetasol  cream (TEMOVATE ) 0.05 %      fluticasone (FLONASE) 50 MCG/ACT nasal spray Place 1 spray into both nostrils daily.     lidocaine -prilocaine  (EMLA ) cream Apply small amount to port and cover with saran wrap 1-2 hours prior to port access 30 g 6   loperamide  (IMODIUM ) 2 MG capsule Take 2 tabs by mouth with first loose stool, then 1 tab with each additional loose stool as needed. Do not exceed 8 tabs in a 24-hour period 60 capsule 3   loratadine (CLARITIN REDITABS) 10 MG dissolvable tablet Take 10 mg by mouth daily.     LORazepam  (ATIVAN ) 0.5 MG tablet Take 1 tablet (0.5 mg total) by mouth every 8 (eight) hours as needed for anxiety or sleep (nausea vomiting). 30 tablet 0   Melatonin 2.5 MG CHEW Chew by mouth.     Multiple Vitamins-Minerals (MULTIVITAMIN WITH MINERALS) tablet Take 1 tablet by mouth daily.     ondansetron  (ZOFRAN ) 8 MG tablet Take 1 tablet (8 mg total) by mouth every 8 (eight) hours as needed for nausea, vomiting or refractory nausea / vomiting. Start on the third day after chemotherapy. 30 tablet 1   Pancrelipase , Lip-Prot-Amyl, (CREON ) 24000-76000 units CPEP TAKE 1 CAPSULE THREE TIMES DAILY BEFORE MEALS 200 capsule 3   promethazine  (PHENERGAN ) 25 MG tablet Take 1 tablet (25 mg total) by mouth every 6 (six)  hours as needed for nausea or vomiting. 60 tablet 1   senna-docusate (SENOKOT-S) 8.6-50 MG tablet Take by mouth.     simethicone  (GAS-X) 80 MG chewable tablet Chew 1 tablet (80 mg total) by mouth every 8 (eight) hours as needed for flatulence. 60 tablet 0   No current facility-administered medications for this visit.   Facility-Administered Medications Ordered in Other Visits  Medication Dose Route Frequency Provider Last Rate Last Admin   0.9 %   sodium chloride  infusion   Intravenous Continuous Babara Call, MD 10 mL/hr at 05/30/24 1000 New Bag at 05/30/24 1000   fluorouracil  (ADRUCIL ) 4,100 mg in sodium chloride  0.9 % 68 mL chemo infusion  2,400 mg/m2 (Order-Specific) Intravenous 1 day or 1 dose Babara Call, MD       heparin  lock flush 100 UNIT/ML injection            irinotecan  (CAMPTOSAR ) 200 mg in sodium chloride  0.9 % 500 mL chemo infusion  125 mg/m2 (Order-Specific) Intravenous Once Shardai Star, MD 170 mL/hr at 05/30/24 1050 200 mg at 05/30/24 1050   leucovorin  680 mg in sodium chloride  0.9 % 250 mL infusion  400 mg/m2 (Order-Specific) Intravenous Once Babara Call, MD 95 mL/hr at 05/30/24 1048 680 mg at 05/30/24 1048   prochlorperazine  (COMPAZINE ) 10 MG tablet            sodium chloride  flush (NS) 0.9 % injection 10 mL  10 mL Intravenous PRN Babara Call, MD   10 mL at 02/18/21 0858   sodium chloride  flush (NS) 0.9 % injection 10 mL  10 mL Intravenous Once Borders, Joshua R, NP       sodium chloride  flush (NS) 0.9 % injection 10 mL  10 mL Intravenous Once Babara Call, MD         PHYSICAL EXAMINATION: ECOG PERFORMANCE STATUS: 0 - Asymptomatic Vitals:   05/30/24 0904  BP: 130/84  Pulse: (!) 59  Resp: 18  Temp: 98.1 F (36.7 C)  SpO2: 99%   Filed Weights   05/30/24 0904  Weight: 133 lb 8 oz (60.6 kg)     Physical Exam Constitutional:      General: She is not in acute distress. HENT:     Head: Normocephalic and atraumatic.  Eyes:     General: No scleral icterus. Cardiovascular:     Rate and Rhythm: Normal rate and regular rhythm.     Heart sounds: Normal heart sounds.  Pulmonary:     Effort: Pulmonary effort is normal. No respiratory distress.  Abdominal:     General: Bowel sounds are normal. There is no distension.     Palpations: Abdomen is soft.  Musculoskeletal:        General: No deformity. Normal range of motion.     Cervical back: Normal range of motion and neck supple.  Skin:    General: Skin is warm.     Coloration:  Skin is not jaundiced.     Findings: No erythema.  Neurological:     Mental Status: She is alert and oriented to person, place, and time. Mental status is at baseline.  Psychiatric:        Mood and Affect: Mood normal.     LABORATORY DATA:  I have reviewed the data as listed    Latest Ref Rng & Units 05/30/2024    8:41 AM 05/09/2024    8:50 AM 04/25/2024    9:20 AM  CBC  WBC 4.0 - 10.5 K/uL 3.4  4.0  5.3  Hemoglobin 12.0 - 15.0 g/dL 88.0  88.3  87.8   Hematocrit 36.0 - 46.0 % 34.4  34.8  35.2   Platelets 150 - 400 K/uL 186  158  197       Latest Ref Rng & Units 05/30/2024    8:41 AM 05/09/2024    8:50 AM 04/25/2024    9:20 AM  CMP  Glucose 70 - 99 mg/dL 899  96  95   BUN 8 - 23 mg/dL 15  16  13    Creatinine 0.44 - 1.00 mg/dL 9.27  9.18  9.35   Sodium 135 - 145 mmol/L 141  138  140   Potassium 3.5 - 5.1 mmol/L 3.9  3.7  4.2   Chloride 98 - 111 mmol/L 107  107  108   CO2 22 - 32 mmol/L 23  23  24    Calcium  8.9 - 10.3 mg/dL 9.0  8.7  9.0   Total Protein 6.5 - 8.1 g/dL 6.2  6.3  6.6   Total Bilirubin 0.0 - 1.2 mg/dL 0.4  0.6  0.9   Alkaline Phos 38 - 126 U/L 92  69  76   AST 15 - 41 U/L 29  27  31    ALT 0 - 44 U/L 24  24  26       RADIOGRAPHIC STUDIES: I have personally reviewed the radiological images as listed and agreed with the findings in the report. Reviewed findings of MRI abdomen MRCP done at [facility]. CT CHEST ABDOMEN PELVIS W CONTRAST Result Date: 04/05/2024 CLINICAL DATA:  Pancreatic cancer * Tracking Code: BO * EXAM: CT CHEST, ABDOMEN, AND PELVIS WITH CONTRAST TECHNIQUE: Multidetector CT imaging of the chest, abdomen and pelvis was performed following the standard protocol during bolus administration of intravenous contrast. RADIATION DOSE REDUCTION: This exam was performed according to the departmental dose-optimization program which includes automated exposure control, adjustment of the mA and/or kV according to patient size and/or use of iterative reconstruction  technique. CONTRAST:  OMNIPAQUE  IOHEXOL  300 MG/ML  SOLN COMPARISON:  01/02/2024 FINDINGS: CT CHEST FINDINGS Cardiovascular: Right chest port catheter. 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 Normal heart size. No pericardial effusion. Mediastinum/Nodes: No enlarged mediastinal, hilar, or axillary lymph nodes. Thyroid gland, trachea, and esophagus demonstrate no significant findings. Lungs/Pleura: Unchanged wedge resection of the dependent right lower lobe. Multiple small bilateral pulmonary nodules, nodule of the dependent left lower lobe is slightly enlarged, measuring 0.8 x 0.5 cm, previously no greater than 0.5 cm (series 4, image 78). Multiple additional small nodules unchanged, for example a 0.5 cm nodule of the anterior right upper lobe (series 4, image 76). No new nodules. No pleural effusion or pneumothorax. Musculoskeletal: No chest wall abnormality. No acute osseous findings. CT ABDOMEN PELVIS FINDINGS Hepatobiliary: No focal liver abnormality is seen. Status post cholecystectomy and hepaticojejunostomy. No biliary dilatation. 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 Interval enlargement of soft tissue nodule adjacent to the right aspect of the superior mesenteric artery, measuring 1.9 x 1.6 cm, previously 1.0 x 1.0 cm (series 2, image 76). Spleen: Normal in size without significant abnormality. Adrenals/Urinary Tract: Adrenal glands are unremarkable. Kidneys are normal, without renal calculi, solid lesion, or hydronephrosis. Bladder is unremarkable. Stomach/Bowel: Stomach is within normal limits. Appendix appears normal. No evidence of bowel wall thickening, distention, or inflammatory changes. Vascular/Lymphatic: Aortic atherosclerosis. No enlarged abdominal or pelvic lymph nodes. Reproductive: No solid mass or  other abnormality. Unchanged left ovarian cyst measuring 5.4 x  3.6 cm (series 2, image 104). Other: No abdominal wall hernia or abnormality. No ascites. Musculoskeletal: No acute osseous findings. IMPRESSION: 1. Status post Whipple procedure. 2. Interval enlargement of soft tissue nodule adjacent to the right aspect of the superior mesenteric artery, consistent with worsened locally recurrent disease. 3. Multiple small bilateral pulmonary nodules, nodule of the dependent left lower lobe is slightly enlarged, measuring 0.8 x 0.5 cm, previously no greater than 0.5 cm. Multiple additional small nodules unchanged. No new nodules. 4. 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 Aortic Atherosclerosis (ICD10-I70.0). Electronically Signed   By: Marolyn JONETTA Jaksch M.D.   On: 04/05/2024 20:58

## 2024-05-30 NOTE — Assessment & Plan Note (Signed)
 Chemotherapy plan as listed above

## 2024-05-30 NOTE — Patient Instructions (Signed)
 [facility] - A DEPT OF [facility]  Discharge Instructions: Thank you for choosing [facility] to provide your oncology and hematology care.  If you have a lab appointment with the Cancer Center, please go directly to the Cancer Center and check in at the registration area.  Wear comfortable clothing and clothing appropriate for easy access to any Portacath or PICC line.   We strive to give you quality time with your provider. You may need to reschedule your appointment if you arrive late (15 or more minutes).  Arriving late affects you and other patients whose appointments are after yours.  Also, if you miss three or more appointments without notifying the office, you may be dismissed from the clinic at the provider's discretion.      For prescription refill requests, have your pharmacy contact our office and allow 72 hours for refills to be completed.    Today you received the following chemotherapy and/or immunotherapy agents- irinotecan, leucovorin, 5FU      To help prevent nausea and vomiting after your treatment, we encourage you to take your nausea medication as directed.  BELOW ARE SYMPTOMS THAT SHOULD BE REPORTED IMMEDIATELY: *FEVER GREATER THAN 100.4 F (38 C) OR HIGHER *CHILLS OR SWEATING *NAUSEA AND VOMITING THAT IS NOT CONTROLLED WITH YOUR NAUSEA MEDICATION *UNUSUAL SHORTNESS OF BREATH *UNUSUAL BRUISING OR BLEEDING *URINARY PROBLEMS (pain or burning when urinating, or frequent urination) *BOWEL PROBLEMS (unusual diarrhea, constipation, pain near the anus) TENDERNESS IN MOUTH AND THROAT WITH OR WITHOUT PRESENCE OF ULCERS (sore throat, sores in mouth, or a toothache) UNUSUAL RASH, SWELLING OR PAIN  UNUSUAL VAGINAL DISCHARGE OR ITCHING   Items with * indicate a potential emergency and should be followed up as soon as possible or go to the Emergency Department if any problems should occur.  Please show the CHEMOTHERAPY ALERT CARD  or IMMUNOTHERAPY ALERT CARD at check-in to the Emergency Department and triage nurse.  Should you have questions after your visit or need to cancel or reschedule your appointment, please contact [facility] - A DEPT OF [facility]  [phone] and follow the prompts.  Office hours are 8:00 a.m. to 4:30 p.m. Monday - Friday. Please note that voicemails left after 4:00 p.m. may not be returned until the following business day.  We are closed weekends and major holidays. You have access to a nurse at all times for urgent questions. Please call the main number to the clinic [phone] and follow the prompts.  For any non-urgent questions, you may also contact your provider using MyChart. We now offer e-Visits for anyone 64 and older to request care online for non-urgent symptoms. For details visit [email or web address].   Also download the MyChart app! Go to the app store, search "MyChart", open the app, select [facility], and log in with your MyChart username and password.

## 2024-05-30 NOTE — Assessment & Plan Note (Signed)
 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 Continue calcium  supplementation, 2000 mg daily-divided in 2-3 doses IV calcium  gluconate 2g if calcium  <=8.5 Calcium  level has been stable

## 2024-05-30 NOTE — Progress Notes (Signed)
 Pt declined benadryl  post infusion. Stated she would take at home if needed.

## 2024-05-31 LAB — CEA: CEA: 5.7 ng/mL — ABNORMAL HIGH (ref 0.0–4.7)

## 2024-05-31 LAB — CANCER ANTIGEN 19-9: CA 19-9: 37 U/mL — ABNORMAL HIGH (ref 0–35)

## 2024-06-01 ENCOUNTER — Inpatient Hospital Stay

## 2024-06-05 ENCOUNTER — Encounter: Payer: Self-pay | Admitting: Oncology

## 2024-06-08 ENCOUNTER — Encounter: Payer: Self-pay | Admitting: Oncology

## 2024-06-13 ENCOUNTER — Other Ambulatory Visit: Payer: Self-pay

## 2024-06-13 ENCOUNTER — Encounter: Payer: Self-pay | Admitting: Oncology

## 2024-06-13 ENCOUNTER — Inpatient Hospital Stay: Admitting: Oncology

## 2024-06-13 ENCOUNTER — Inpatient Hospital Stay

## 2024-06-13 VITALS — BP 137/76 | HR 60 | Temp 97.4°F | Resp 18 | Wt 132.2 lb

## 2024-06-13 VITALS — Ht 67.0 in

## 2024-06-13 DIAGNOSIS — Z5111 Encounter for antineoplastic chemotherapy: Secondary | ICD-10-CM

## 2024-06-13 DIAGNOSIS — C259 Malignant neoplasm of pancreas, unspecified: Secondary | ICD-10-CM

## 2024-06-13 DIAGNOSIS — G620 Drug-induced polyneuropathy: Secondary | ICD-10-CM

## 2024-06-13 DIAGNOSIS — T451X5A Adverse effect of antineoplastic and immunosuppressive drugs, initial encounter: Secondary | ICD-10-CM | POA: Diagnosis not present

## 2024-06-13 DIAGNOSIS — K8689 Other specified diseases of pancreas: Secondary | ICD-10-CM

## 2024-06-13 DIAGNOSIS — R110 Nausea: Secondary | ICD-10-CM | POA: Diagnosis not present

## 2024-06-13 LAB — CBC WITH DIFFERENTIAL (CANCER CENTER ONLY)
Abs Immature Granulocytes: 0.02 K/uL (ref 0.00–0.07)
Basophils Absolute: 0 K/uL (ref 0.0–0.1)
Basophils Relative: 1 %
Eosinophils Absolute: 0.1 K/uL (ref 0.0–0.5)
Eosinophils Relative: 2 %
HCT: 35.4 % — ABNORMAL LOW (ref 36.0–46.0)
Hemoglobin: 11.9 g/dL — ABNORMAL LOW (ref 12.0–15.0)
Immature Granulocytes: 1 %
Lymphocytes Relative: 25 %
Lymphs Abs: 1 K/uL (ref 0.7–4.0)
MCH: 32 pg (ref 26.0–34.0)
MCHC: 33.6 g/dL (ref 30.0–36.0)
MCV: 95.2 fL (ref 80.0–100.0)
Monocytes Absolute: 0.4 K/uL (ref 0.1–1.0)
Monocytes Relative: 9 %
Neutro Abs: 2.6 K/uL (ref 1.7–7.7)
Neutrophils Relative %: 62 %
Platelet Count: 189 K/uL (ref 150–400)
RBC: 3.72 MIL/uL — ABNORMAL LOW (ref 3.87–5.11)
RDW: 13.5 % (ref 11.5–15.5)
WBC Count: 4.1 K/uL (ref 4.0–10.5)
nRBC: 0 % (ref 0.0–0.2)

## 2024-06-13 LAB — CMP (CANCER CENTER ONLY)
ALT: 28 U/L (ref 0–44)
AST: 29 U/L (ref 15–41)
Albumin: 4.2 g/dL (ref 3.5–5.0)
Alkaline Phosphatase: 87 U/L (ref 38–126)
Anion gap: 12 (ref 5–15)
BUN: 15 mg/dL (ref 8–23)
CO2: 22 mmol/L (ref 22–32)
Calcium: 9.1 mg/dL (ref 8.9–10.3)
Chloride: 107 mmol/L (ref 98–111)
Creatinine: 0.76 mg/dL (ref 0.44–1.00)
GFR, Estimated: >60 mL/min (ref >60)
Glucose, Bld: 136 mg/dL — ABNORMAL HIGH (ref 70–99)
Potassium: 3.7 mmol/L (ref 3.5–5.1)
Sodium: 141 mmol/L (ref 135–145)
Total Bilirubin: 0.5 mg/dL (ref 0.0–1.2)
Total Protein: 6.3 g/dL — ABNORMAL LOW (ref 6.5–8.1)

## 2024-06-13 MED ORDER — SODIUM CHLORIDE 0.9 % IV SOLN
2400.0000 mg/m2 | INTRAVENOUS | Status: DC
  Administered 2024-06-13: 14:00:00 4100 mg via INTRAVENOUS
  Filled 2024-06-13: qty 82

## 2024-06-13 MED ORDER — ATROPINE SULFATE 1 MG/ML IV SOLN
0.5000 mg | Freq: Once | INTRAVENOUS | Status: AC
  Administered 2024-06-13: 11:00:00 0.5 mg via INTRAVENOUS
  Filled 2024-06-13: qty 1

## 2024-06-13 MED ORDER — DIPHENHYDRAMINE HCL 25 MG PO TABS
50.0000 mg | ORAL_TABLET | Freq: Once | ORAL | Status: AC
  Administered 2024-06-13: 10:00:00 50 mg via ORAL
  Filled 2024-06-13: qty 2

## 2024-06-13 MED ORDER — DIPHENHYDRAMINE HCL 25 MG PO CAPS: 25.0000 mg | ORAL_CAPSULE | Freq: Once | ORAL | Status: DC

## 2024-06-13 MED ORDER — PROMETHAZINE HCL 25 MG RE SUPP
25.0000 mg | Freq: Four times a day (QID) | RECTAL | 0 refills | Status: DC | PRN
  Filled 2024-06-13: qty 20, 5d supply, fill #0

## 2024-06-13 MED ORDER — SODIUM CHLORIDE 0.9 % IV SOLN
150.0000 mg | Freq: Once | INTRAVENOUS | Status: AC
  Administered 2024-06-13: 10:00:00 150 mg via INTRAVENOUS
  Filled 2024-06-13: qty 150

## 2024-06-13 MED ORDER — SODIUM CHLORIDE 0.9 % IV SOLN
INTRAVENOUS | Status: DC
  Filled 2024-06-13: qty 250

## 2024-06-13 MED ORDER — DEXAMETHASONE SOD PHOSPHATE PF 10 MG/ML IJ SOLN
10.0000 mg | Freq: Once | INTRAMUSCULAR | Status: AC
  Administered 2024-06-13: 10:00:00 10 mg via INTRAVENOUS

## 2024-06-13 MED ORDER — SODIUM CHLORIDE 0.9 % IV SOLN
400.0000 mg/m2 | Freq: Once | INTRAVENOUS | Status: AC
  Administered 2024-06-13: 11:00:00 680 mg via INTRAVENOUS
  Filled 2024-06-13: qty 34

## 2024-06-13 MED ORDER — SODIUM CHLORIDE 0.9 % IV SOLN
125.0000 mg/m2 | Freq: Once | INTRAVENOUS | Status: AC
  Administered 2024-06-13: 11:00:00 200 mg via INTRAVENOUS
  Filled 2024-06-13: qty 10

## 2024-06-13 MED ORDER — PALONOSETRON HCL INJECTION 0.25 MG/5ML
0.2500 mg | Freq: Once | INTRAVENOUS | Status: AC
  Administered 2024-06-13: 10:00:00 0.25 mg via INTRAVENOUS
  Filled 2024-06-13: qty 5

## 2024-06-13 NOTE — Assessment & Plan Note (Signed)
 Chemotherapy plan as listed above

## 2024-06-13 NOTE — Assessment & Plan Note (Signed)
 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 Continue calcium  supplementation, 2000 mg daily-divided in 2-3 doses IV calcium  gluconate 2g if calcium  <=8.5 Calcium  level has been stable

## 2024-06-13 NOTE — Assessment & Plan Note (Signed)
 S/p  Whipple procedure..  Continue Creon , continue 24000 units TID with meals and  with her snacks

## 2024-06-13 NOTE — Addendum Note (Signed)
 Addended by: BABARA CALL on: 06/13/2024 01:10 PM   Modules accepted: Orders

## 2024-06-13 NOTE — Progress Notes (Signed)
 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  Pathology proved liver metastatic disease- additional chemotherapy --> whipple procedure.ypT1b ypN0--> adjuvant Gem/Abraxane  finished in August 2023--> Sept 2024 CT showed progression in lung --> Nov 2024 Wedge biopsy- Recurrent pancreatic cancer with lung metastasis--> 12/2023 CT stable disease--> 04/03/2024 CT - disease progression -soft tissue close to SMA increased size.  Slightly enlarged lung nodules. Signatera circulating tumor DNA testing- negative.  Currently on 5-FU and Irinotecan . -Irinotecan  dose reduced to 125 mg/m2,  infusion time-180 minutes. Labs are reviewed and discussed with patient. CEA fluctuates, CA 19.9 increased. Dec 2025 Signatera cDNA positive.  Proceed with cycle 4 5-FU and Irinotecan . Shared decision was made to obtain repeat CT after cycle 5 treatment.     Chemotherapy-induced nausea Nausea during first 3 days after chemotherapy.  Recommend home antiemetics PRN. She has Zofran  and Compazine . Compazine  is not effective. Zofran  can not be taken within first 3 days.  Add Emend to her premed today Phenergan  25mg  Q6h PRN helped some, but sometimes she may vomit medication out. Recommend switch to Phernergan suppository.  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  She did not tolerate Cymbalta .   Stable symptoms She gets  acupuncture sessions which help her symptoms.   Encounter for antineoplastic chemotherapy Chemotherapy plan as listed above  Hypocalcemia Likely due to malabsorption. Continue calcium  supplementation, 2000 mg daily-divided in 2-3 doses IV calcium  gluconate 2g if calcium  <=8.5 Calcium  level has been stable  Pancreatic insufficiency S/p  Whipple procedure..  Continue Creon , continue 24000 units TID with meals and  with her snacks     Orders Placed This Encounter  Procedures   CT CHEST ABDOMEN PELVIS W CONTRAST    Standing Status:   Future    Expected Date:   07/09/2024    Expiration Date:   06/13/2025    If indicated for the ordered procedure, I authorize the administration of contrast media per Radiology protocol:   Yes    Does the patient have a contrast media/X-ray dye allergy?:   No    Preferred imaging location?:   [facility]    If indicated for the ordered procedure, I authorize the administration of oral contrast media per Radiology protocol:   Yes    Follow up  2 weeks lab MD FOLFIRI  All questions were answered. The patient knows to call the clinic with any problems, questions or concerns.  Call Babara, MD, PhD [facility] Hematology Oncology 06/13/2024   Babara Call, MD    HISTORY OF PRESENTING ILLNESS:   Cassandra [name] is a  68 y.o.  female with presents for follow up of Stage IV pancreatic adenocarcinoma Patient initially presented with jaundice, transaminitis, bilirubin was 9.9.  CA 19-9 was 1874.  Patient also reports unintentional weight loss. 02/08/2020 MRI abdomen and MRCP with and without contrast was done at [facility] which showed pancreatic head mass measuring up to 3 cm, with marked associated narrowing of the portal vein confluence.  SMA is preserved.  Marked intrahepatic and extrahepatic biliary duct dilatation as well as mild dilatation of the main pancreatic duct. Multiple hepatic masses highly concerning for metastatic disease.  Patient underwent EUS on  02/19/2020, which showed irregular mass identified in the pancreatic head, hypoechoic, measured 77mmx33mm, sonographic evidence concerning for invasion into the superior mesenteric artery.  There is no sign of significant abnormality in the main pancreatic duct.  Dilatation of common bile duct which measured up to 16 mm.  Region of celiac artery was visualized and showed no signs of significant abnormality.  No lymphadenopathy.  FNA showed adenocarcinoma.  02/19/2020, ERCP, malignant.  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  The stricture was treated with placement of wall flex metal stent.  Patient was seen by [facility] oncology Dr. [name] and was recommended for 3 drug regimen FOLFIRINOX.  Patient prefers to do chemotherapy locally at [facility].  Patient was referred to establish care today. She denies any pain.  Since stent placement, skin jaundice has improved.  Itchiness has also improved. Patient was accompanied by her husband today.  She has a family history of breast cancer in sister and paternal aunt, colon cancer paternal grandmother.  #No reportable targetable mutation on NGS 9/14/2021cycle 1 FOLFIRINOX.  Patient received oxaliplatin  and about 50% of Irinotecan  on day 1 and had experienced neurologic symptoms.  She went to ER and working diagnosis is TIA, and eventually I think this is due to irinotecan  side effects -irinotecan -associated dysarthria, lip/tongue numbness.  Adjustment was made for Irinotecan  to be  infused over 180 minutes.  Atropine  0.5 mg once prior to the irinotecan . No recurrent symptoms.   # 03/19/2020-03/21/2020 patient was admitted due to sepsis with strep pneumonia bacteremia.  Patient was treated with IV Rocephin .  TEE was done which showed no vegetation.  No PFO or ASD.  Patient was discharged home and he finished full course of 14 days of IV Rocephin  on 04/02/2020 per ID recommendation.  Repeat blood culture was also  negative.  08/25/20 cycle 10 FOLFIRINOX 09/08/20- present  Starting cycle 11, FOLFIRI, Oxaliplatin  discontinued due to neuropathy   #NGS showed no reportable targetable mutation #Genetic testing-[employer] diagnostic testing showed no pathological variants identified. MS stable, TMB 0mut/mb, KRAS G12D, SF3B1 K700E, TP53 V268fs*30  #01/14/2021  CT done at [facility] during the interval, stable disease.  # Her case was presented by Dr.Jia from [facility] tumor board. [name] were 4~5 liver lesions on OSH MRI last year at the time of diagnosis highly suspicious for metastasis. She is not eligible for surgical protocol for metastasis resection given the number of liver metastasis is >3. However given her excellent and durable response to chemo, surgical resection may be considered pending sustained disease control at 1 year and may require partial hepatectomy first to see if there is viable tumor before proceeding to Whipple resection ]  Patient had a COVID-19 infection in September 2022.  03/23/2021 CT abdomen pelvis No significant change in the ill-defined pancreatic head mass or  associated biliary ductal and pancreatic ductal dilation.  Slight interval enlargement of subcentimeter anterior peripancreatic  lymph node, nonspecific. Attention on follow-up per clinical protocol. Similar enlargement of the ascending thoracic aorta measuring 4.4 cm  03/23/2021 MRI abdomen w and wo  Increasing ill-defined hypoenhancement of the pancreatic head surrounding the common bile duct stent, in keeping with known pancreatic malignancy. Anterior peripancreatic lymph node is better evaluated on CT.  Unchanged position of a common bile duct stent  with similar mild diffuse intrahepatic ductal dilation and pneumobilia. No evidence of metastatic disease in the abdomen or pelvis. Partially visualized cystic left adnexal lesion measuring up to 3.9 cm.   04/10/2021, patient underwent liver resection at [facility], by Dr. Zani.   Pathology  report from [facility] was reviewed. Segments 3 partial hepatectomy, negative for viable tumor. Section 5/6 partial hepatectomy part 1 and 2, microscopic foci of residual viable adenocarcinoma, morphologically consistent with history of pancreatic primary.  Parenchymal margin is uninvolved.  #Patient resumed on FOLFIRI on 04/28/2021. She got another cycle on 05/12/2021 #05/26/2021, patient did not get additional chemotherapy due to transaminitis.  Shared decision was made to stop FOLFIRI and switch to gemcitabine  Abraxane  treatments.  05/26/2021, patient developed transaminitis, AST 763, ALT 691, alkaline phosphatase 364.  Bilirubin 0.7 05/26/2021 stat ultrasound abdomen right upper quadrant showed interval development of 3.3 x 1.8 cm complex mass in the right hepatic lobe.  Increased extrahepatic ductal dilatation.  Concerning for CBD obstruction. 05/29/2021, MRI abdomen MRCP with and without contrast showed unchanged pancreatic head soft tissue.  Severe intra and extrahepatic biliary ductal dilatation.  Common bile duct stents remain in position however patency is not established.  No evidence of lymphadenopathy or metastatic disease in the abdomen.  With further communication with radiologist, addendum was added that there is internal fluid signal and no appreciable associated contrast enhancement measuring 3.4 x 2.3 cm.  This appearance is generally not consistent with metastasis and is of uncertain significance.  Possibly reflecting hepatic abscess or residual of subcapsular hematoma.  Patient was seen by [facility] Dr. Oneal team.  He had ERCP done on 06/05/2021, with findings of One stent from the biliary tree was seen in the major papilla. The stent had migrated significantly into the duodenum. This is the cause of stent malfunction. One stent was removed from the biliary tree. Prior biliary sphincterotomy appeared open. - A single severe biliary stricture was found in the lower third of the main bile duct  with upstream dilation. The stricture was alignant appearing.- The biliary tree was swept and sludge was found.- One fully covered metal stent was placed into the common bile duct across the stricture.- No pancreatogram performed.  06/12/2021-08/10/2021 gemcitabine  and Abraxane .   10/14/2021, patient underwent Whipple procedure. Liver biopsy negative for malignancy. Review procedure showed invasive adenocarcinoma, moderately differentiated, centered in pancreatic head and confined in the Pancreas. Surgical margin is negative for malignancy.  36 lymph nodes were all negative for malignancy.  1 hepatic artery lymph node was harvested and was negative.  Gallbladder negative for malignancy.  Cystic duct excision negative for malignancy. pT1b pN0  10/25/2021, CT abdomen pelvis with contrast showed rim-enhancing fluid collection in the region of the hepatic hilum. Patient had a readmission for superficial wound infection.  She completed antibiotics on 11/03/2021.  11/03/2021, CT abdomen pelvis with contrast showed near resolution of previously seen fluid collection in the region of the hepatic hilum.  Similar appearance of the area of hypoenhancement in the liver.  Attention on follow-up.  11/17/2021, resumed on gemcitabine /Abraxane . 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  12/12/ 2023 CT scan at [facility] shows no evidence of cancer recurrence.-NED 09/21/2022 CT chest abdomen pelvis w contrast at [facility] showed-No evidence of metastatic disease in the chest, abdomen, or pelvis. 12/21/2022 CT chest abdomen pelvis w contrast at [facility] showed-No evidence of metastatic disease in the chest, abdomen, or pelvis.   03/22/2023 CT scan at [facility] showed enlarging lung nodules. RLL 1cm, LLL 0.5cm,  RUL 0.5cm Stable hypoattenuating right inferior hepatic lobe lesion measuring 0.9 cm.  Similar left adnexal cystic lesion measuring up to 5.0 cm.   04/06/2023 s/p lung nodule biopsy.  Rare atypical epithelioid cells present with  evidence suggestive of invasion, suspicious but not diagnostic for malignancy.   05/20/2023: Right robot assisted thoracoscopic wedge resection with MLND (Dr. Murlean)   Pathology:  Suspicious for malignancy but not diagnostic A. Lymph node, level 8, biopsy: Metastatic carcinoma involving one lymph node (1/1).  B. Lung, right lower lobe, wedge resection: Lung tissue positive for adenocarcinoma, consistent with metastasis from pancreas primary. See comment. Tumor size: 1.0 cm. Operative margin: Negative  Comment: Note is made of the patient's history of invasive adenocarcinoma of the pancreatic head ([id number]; 10/14/2021), which is compared to the current case. Comparison is challenging given the post-treatment nature of the pancreatic tumor; however, there are some morphologic similarities.  Although often unhelpful in the setting of lung primary versus pancreatic primary, immunohistochemical stains were performed. Tumor cells demonstrate the below immunoprofile which would could support pulmonary adenocarcinoma with enteric differentiation but would also be consistent with metastasis from the patient's known pancreatic primary; the latter is favored.              Positive                             Negative CK7 (strong, diffuse) TTF-1  CK20 (strong, focal)                    Positive and negative controls perform appropriately.    C. Lymph node, level 7, biopsy: Metastatic carcinoma involving one lymph node (1/1).  D. Lymph node, level 4R, biopsy: One lymph node, negative for malignancy (0/1).   04/03/2024, CT chest abdomen pelvis with contrast showed Enlargement of soft tissue nodule adjacent to the right aspect of the superior mesenteric artery.  Multiple small bilateral pulmonary nodules, slightly enlarged.   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  She gets acupuncture session which helped her symptoms.  INTERVAL HISTORY Cassandra [name] is a 68 y.o. female who has above history reviewed by me today presents for stage IV pancreatic cancer.   Overall she tolerated with FOLFIRI, with moderate difficulties.  For 3 days after chemotherapy, patient reports nausea. She tried Phenergan  which helps a bit, but sometime she still vomit up and was not sure if medication was absorbed. ,  Symptoms improved after 3 days.  She takes calcium  supplementation as instructed.  She take Creon  for pancreatic insufficiency  She denies abdominal pain diarrhea.      Review of Systems  Constitutional:  Positive for fatigue. Negative for appetite change, chills, fever and unexpected weight change.  HENT:   Negative for hearing loss and voice change.   Eyes:  Negative for eye problems.  Respiratory:  Negative for chest tightness and cough.   Cardiovascular:  Negative for chest pain.  Gastrointestinal:  Positive for nausea. Negative for abdominal distention, abdominal pain and blood in stool.  Endocrine: Negative for hot flashes.  Genitourinary:  Negative for difficulty urinating and frequency.   Musculoskeletal:  Positive for arthralgias.  Skin:  Negative for itching and rash.  Neurological:  Positive for numbness. Negative for extremity weakness.  Hematological:  Negative for adenopathy.  Psychiatric/Behavioral:  Negative for confusion.     MEDICAL HISTORY:  Past Medical History:  Diagnosis Date   Allergy    Anemia    Bacteremia due to Streptococcus pneumoniae 03/19/2020   Cancer [facility])    pancreatic cancer   Colon polyps    Family history of breast cancer    Neuropathy due to drug    chemo induced   Neutropenia 04/18/2020   Osteopenia after menopause 05/2017   femoral neck T score -2.0   Personal history of chemotherapy    current for pancreatic ca   PONV (postoperative nausea and vomiting)    Pure hypercholesterolemia    Sepsis (HCC) 03/19/2020    SURGICAL HISTORY: Past Surgical History:  Procedure Laterality Date    COLONOSCOPY  07/2015   WNL   COLONOSCOPY  2008/2011   COLONOSCOPY WITH PROPOFOL  N/A 07/10/2021   Procedure: COLONOSCOPY WITH PROPOFOL ;  Surgeon: Maryruth Ole DASEN, MD;  Location: [facility] ENDOSCOPY;  Service: Endoscopy;  Laterality: N/A;   IR CV LINE INJECTION  04/28/2021   OVARIAN CYST REMOVAL  1992   dermoid-Dr CAK   PORTA CATH INSERTION N/A 03/07/2020   Procedure: PORTA CATH INSERTION;  Surgeon: Marea Selinda RAMAN, MD;  Location: [facility] INVASIVE CV LAB;  Service: Cardiovascular;  Laterality: N/A;   TEE WITHOUT CARDIOVERSION N/A 03/21/2020   Procedure: TRANSESOPHAGEAL ECHOCARDIOGRAM (TEE);  Surgeon: Fernand Denyse LABOR, MD;  Location: [facility] ORS;  Service: Cardiovascular;  Laterality: N/A;   TUBAL LIGATION  1993    SOCIAL HISTORY: Social History   Socioeconomic History   Marital status: Married    Spouse name: Not on file   Number of children: 2   Years of education: Not on file   Highest education level: Bachelor's degree (e.g., BA, AB, BS)  Occupational History   Occupation: Runner, Broadcasting/film/video    Comment: retired   Occupation: Visual Merchandiser  Tobacco Use   Smoking status: Former    Current packs/day: 0.00    Average packs/day: 1 pack/day for 10.0 years (10.0 ttl pk-yrs)    Types: Cigarettes    Start date: 06/29/1975    Quit date: 06/28/1985    Years since quitting: 38.9   Smokeless tobacco: Never   Tobacco comments:    Quit smoking 1987  Vaping Use   Vaping status: Never Used  Substance and Sexual Activity   Alcohol use: Yes    Alcohol/week: 5.0 standard drinks of alcohol    Types: 5 Glasses of wine per week    Comment: 0-2 mixed drinks a day   Drug use: No   Sexual activity: Not Currently    Birth control/protection: Post-menopausal  Other Topics Concern   Not on file  Social History Narrative   Not on file   Social Drivers of Health   Tobacco Use: Medium Risk (05/30/2024)   Patient History    Smoking Tobacco Use: Former    Smokeless Tobacco Use: Never    Passive Exposure: Not on Programmer, Applications Strain: Low Risk  (04/07/2024)   Received from [facility]   Overall Financial Resource Strain (CARDIA)    Difficulty of Paying Living Expenses: Not hard at all  Food Insecurity: No Food Insecurity (04/07/2024)   Received from [facility]   Epic    Within the past 12 months, you worried that your food would run out before you got the money to buy more.: Never true    Within the past 12 months, the food you bought just didn't last and you didn't have money to get more.: Never true  Transportation Needs: No  Transportation Needs (04/07/2024)   Received from [facility] - Transportation    In the past 12 months, has lack of transportation kept you from medical appointments or from getting medications?: No    Lack of Transportation (Non-Medical): No  Physical Activity: Sufficiently Active (10/18/2023)   Exercise Vital Sign    Days of Exercise per Week: 6 days    Minutes of Exercise per Session: 30 min  Stress: No Stress Concern Present (10/18/2023)   [employer] of Occupational Health - Occupational Stress Questionnaire    Feeling of Stress : Not at all  Social Connections: Moderately Integrated (10/18/2023)   Social Connection and Isolation Panel    Frequency of Communication with Friends and Family: More than three times a week    Frequency of Social Gatherings with Friends and Family: More than three times a week    Attends Religious Services: More than 4 times per year    Active Member of [employer] or Organizations: No    Attends Banker Meetings: Never    Marital Status: Married  Catering Manager Violence: Not At Risk (10/18/2023)   Humiliation, Afraid, Rape, and Kick questionnaire    Fear of Current or Ex-Partner: No    Emotionally Abused: No    Physically Abused: No    Sexually Abused: No  Depression (PHQ2-9): Low Risk (03/27/2024)   Depression (PHQ2-9)    PHQ-2 Score: 0  Alcohol  Screen: Low Risk (10/18/2023)   Alcohol Screen    Last Alcohol Screening Score (AUDIT): 4  Housing: Low Risk  (04/07/2024)   Received from [facility]   Epic    In the last 12 months, was there a time when you were not able to pay the mortgage or rent on time?: No    In the past 12 months, how many times have you moved where you were living?: 0    At any time in the past 12 months, were you homeless or living in a shelter (including now)?: No  Utilities: Not At Risk (04/07/2024)   Received from [facility]   Epic    In the past 12 months has the electric, gas, oil, or water company threatened to shut off services in your home?: No  Health Literacy: Adequate Health Literacy (10/18/2023)   B1300 Health Literacy    Frequency of need for help with medical instructions: Never    FAMILY HISTORY: Family History  Problem Relation Age of Onset   Breast cancer Paternal Aunt 4   Diabetes Mother    Osteoporosis Mother    Hyperlipidemia Father    Rheumatic fever Father    Valvular heart disease Father    Cancer Paternal Grandmother        possible colon   Breast cancer Sister 10    ALLERGIES:  is allergic to irinotecan  and penicillin g.  MEDICATIONS:  Current Outpatient Medications  Medication Sig Dispense Refill   b complex vitamins capsule Take 1 capsule by mouth daily.     Calcium  Carbonate-Vit D-Min (CALCIUM  1200 PO) Take by mouth.     Cholecalciferol  25 MCG (1000 UT) tablet Take 2,000 Units by mouth daily.     clobetasol cream (TEMOVATE) 0.05 %      fluticasone (FLONASE) 50 MCG/ACT nasal spray Place 1 spray into both nostrils daily.     lidocaine -prilocaine  (EMLA ) cream Apply small amount to port and cover with saran wrap 1-2 hours prior to port access 30  g 6   loperamide  (IMODIUM ) 2 MG capsule Take 2 tabs by mouth with first loose stool, then 1 tab with each additional loose stool as needed. Do not exceed 8 tabs in a 24-hour period 60 capsule 3    loratadine (CLARITIN REDITABS) 10 MG dissolvable tablet Take 10 mg by mouth daily.     LORazepam  (ATIVAN ) 0.5 MG tablet Take 1 tablet (0.5 mg total) by mouth every 8 (eight) hours as needed for anxiety or sleep (nausea vomiting). 30 tablet 0   Melatonin 2.5 MG CHEW Chew by mouth.     Multiple Vitamins-Minerals (MULTIVITAMIN WITH MINERALS) tablet Take 1 tablet by mouth daily.     ondansetron  (ZOFRAN ) 8 MG tablet Take 1 tablet (8 mg total) by mouth every 8 (eight) hours as needed for nausea, vomiting or refractory nausea / vomiting. Start on the third day after chemotherapy. 30 tablet 1   Pancrelipase , Lip-Prot-Amyl, (CREON ) 24000-76000 units CPEP TAKE 1 CAPSULE THREE TIMES DAILY BEFORE MEALS 200 capsule 3   promethazine  (PHENERGAN ) 25 MG suppository Place 1 suppository (25 mg total) rectally every 6 (six) hours as needed for nausea or vomiting. 30 each 0   senna-docusate (SENOKOT-S) 8.6-50 MG tablet Take by mouth.     simethicone  (GAS-X) 80 MG chewable tablet Chew 1 tablet (80 mg total) by mouth every 8 (eight) hours as needed for flatulence. 60 tablet 0   No current facility-administered medications for this visit.   Facility-Administered Medications Ordered in Other Visits  Medication Dose Route Frequency Provider Last Rate Last Admin   0.9 %  sodium chloride  infusion   Intravenous Continuous Babara Call, MD 10 mL/hr at 06/13/24 0951 New Bag at 06/13/24 0951   fluorouracil  (ADRUCIL ) 4,100 mg in sodium chloride  0.9 % 68 mL chemo infusion  2,400 mg/m2 (Order-Specific) Intravenous 1 day or 1 dose Babara Call, MD       heparin  lock flush 100 UNIT/ML injection            irinotecan  (CAMPTOSAR ) 200 mg in sodium chloride  0.9 % 500 mL chemo infusion  125 mg/m2 (Order-Specific) Intravenous Once Elisheba Mcdonnell, MD 170 mL/hr at 06/13/24 1108 200 mg at 06/13/24 1108   leucovorin  680 mg in sodium chloride  0.9 % 250 mL infusion  400 mg/m2 (Order-Specific) Intravenous Once Babara Call, MD 95 mL/hr at 06/13/24 1105 680 mg at  06/13/24 1105   prochlorperazine  (COMPAZINE ) 10 MG tablet            sodium chloride  flush (NS) 0.9 % injection 10 mL  10 mL Intravenous PRN Babara Call, MD   10 mL at 02/18/21 0858   sodium chloride  flush (NS) 0.9 % injection 10 mL  10 mL Intravenous Once Borders, Fonda SAUNDERS, NP       sodium chloride  flush (NS) 0.9 % injection 10 mL  10 mL Intravenous Once Babara Call, MD         PHYSICAL EXAMINATION: ECOG PERFORMANCE STATUS: 0 - Asymptomatic Vitals:   06/13/24 0908 06/13/24 0913  BP: (!) 141/77 137/76  Pulse: 60   Resp: 18   Temp: (!) 97.4 F (36.3 C)    Filed Weights   06/13/24 0908  Weight: 132 lb 3.2 oz (60 kg)     Physical Exam Constitutional:      General: She is not in acute distress. HENT:     Head: Normocephalic and atraumatic.  Eyes:     General: No scleral icterus. Cardiovascular:     Rate and Rhythm: Normal rate  and regular rhythm.     Heart sounds: Normal heart sounds.  Pulmonary:     Effort: Pulmonary effort is normal. No respiratory distress.  Abdominal:     General: Bowel sounds are normal. There is no distension.     Palpations: Abdomen is soft.  Musculoskeletal:        General: No deformity. Normal range of motion.     Cervical back: Normal range of motion and neck supple.  Skin:    General: Skin is warm.     Coloration: Skin is not jaundiced.     Findings: No erythema.  Neurological:     Mental Status: She is alert and oriented to person, place, and time. Mental status is at baseline.  Psychiatric:        Mood and Affect: Mood normal.     LABORATORY DATA:  I have reviewed the data as listed    Latest Ref Rng & Units 06/13/2024    8:52 AM 05/30/2024    8:41 AM 05/09/2024    8:50 AM  CBC  WBC 4.0 - 10.5 K/uL 4.1  3.4  4.0   Hemoglobin 12.0 - 15.0 g/dL 88.0  88.0  88.3   Hematocrit 36.0 - 46.0 % 35.4  34.4  34.8   Platelets 150 - 400 K/uL 189  186  158       Latest Ref Rng & Units 06/13/2024    8:52 AM 05/30/2024    8:41 AM 05/09/2024     8:50 AM  CMP  Glucose 70 - 99 mg/dL 863  899  96   BUN 8 - 23 mg/dL 15  15  16    Creatinine 0.44 - 1.00 mg/dL 9.23  9.27  9.18   Sodium 135 - 145 mmol/L 141  141  138   Potassium 3.5 - 5.1 mmol/L 3.7  3.9  3.7   Chloride 98 - 111 mmol/L 107  107  107   CO2 22 - 32 mmol/L 22  23  23    Calcium  8.9 - 10.3 mg/dL 9.1  9.0  8.7   Total Protein 6.5 - 8.1 g/dL 6.3  6.2  6.3   Total Bilirubin 0.0 - 1.2 mg/dL 0.5  0.4  0.6   Alkaline Phos 38 - 126 U/L 87  92  69   AST 15 - 41 U/L 29  29  27    ALT 0 - 44 U/L 28  24  24       RADIOGRAPHIC STUDIES: I have personally reviewed the radiological images as listed and agreed with the findings in the report. Reviewed findings of MRI abdomen MRCP done at [facility] CT CHEST ABDOMEN PELVIS W CONTRAST Result Date: 04/05/2024 CLINICAL DATA:  Pancreatic cancer * Tracking Code: BO * EXAM: CT CHEST, ABDOMEN, AND PELVIS WITH CONTRAST TECHNIQUE: Multidetector CT imaging of the chest, abdomen and pelvis was performed following the standard protocol during bolus administration of intravenous contrast. RADIATION DOSE REDUCTION: This exam was performed according to the departmental dose-optimization program which includes automated exposure control, adjustment of the mA and/or kV according to patient size and/or use of iterative reconstruction technique. CONTRAST:  OMNIPAQUE  IOHEXOL  300 MG/ML  SOLN COMPARISON:  01/02/2024 FINDINGS: CT CHEST FINDINGS Cardiovascular: Right chest port catheter. 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 Normal heart size. No pericardial effusion. Mediastinum/Nodes: No enlarged mediastinal, hilar, or axillary lymph nodes. Thyroid gland, trachea, and esophagus demonstrate no significant findings. Lungs/Pleura:  Unchanged wedge resection of the dependent right lower lobe. Multiple small bilateral pulmonary nodules, nodule of the dependent left lower lobe is slightly enlarged,  measuring 0.8 x 0.5 cm, previously no greater than 0.5 cm (series 4, image 78). Multiple additional small nodules unchanged, for example a 0.5 cm nodule of the anterior right upper lobe (series 4, image 76). No new nodules. No pleural effusion or pneumothorax. Musculoskeletal: No chest wall abnormality. No acute osseous findings. CT ABDOMEN PELVIS FINDINGS Hepatobiliary: No focal liver abnormality is seen. Status post cholecystectomy and hepaticojejunostomy. No biliary dilatation. 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 Interval enlargement of soft tissue nodule adjacent to the right aspect of the superior mesenteric artery, measuring 1.9 x 1.6 cm, previously 1.0 x 1.0 cm (series 2, image 76). Spleen: Normal in size without significant abnormality. Adrenals/Urinary Tract: Adrenal glands are unremarkable. Kidneys are normal, without renal calculi, solid lesion, or hydronephrosis. Bladder is unremarkable. Stomach/Bowel: Stomach is within normal limits. Appendix appears normal. No evidence of bowel wall thickening, distention, or inflammatory changes. Vascular/Lymphatic: Aortic atherosclerosis. No enlarged abdominal or pelvic lymph nodes. Reproductive: No solid mass or other abnormality. Unchanged left ovarian cyst measuring 5.4 x 3.6 cm (series 2, image 104). Other: No abdominal wall hernia or abnormality. No ascites. Musculoskeletal: No acute osseous findings. IMPRESSION: 1. Status post Whipple procedure. 2. Interval enlargement of soft tissue nodule adjacent to the right aspect of the superior mesenteric artery, consistent with worsened locally recurrent disease. 3. Multiple small bilateral pulmonary nodules, nodule of the dependent left lower lobe is slightly enlarged, measuring 0.8 x 0.5 cm, previously no greater than 0.5 cm. Multiple additional small nodules unchanged. No new nodules. 4. 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 Aortic Atherosclerosis (ICD10-I70.0). Electronically Signed   By: Marolyn JONETTA Jaksch M.D.   On: 04/05/2024 20:58

## 2024-06-13 NOTE — Assessment & Plan Note (Addendum)
 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  Pathology proved liver metastatic disease- additional chemotherapy --> whipple procedure.ypT1b ypN0--> adjuvant Gem/Abraxane  finished in August 2023--> Sept 2024 CT showed progression in lung --> Nov 2024 Wedge biopsy- Recurrent pancreatic cancer with lung metastasis--> 12/2023 CT stable disease--> 04/03/2024 CT - disease progression -soft tissue close to SMA increased size.  Slightly enlarged lung nodules. Signatera circulating tumor DNA testing- negative.  Currently on 5-FU and Irinotecan . -Irinotecan  dose reduced to 125 mg/m2,  infusion time-180 minutes. Labs are reviewed and discussed with patient. CEA fluctuates, CA 19.9 increased. Dec 2025 Signatera cDNA positive.  Proceed with cycle 4 5-FU and Irinotecan . Shared decision was made to obtain repeat CT after cycle 5 treatment.

## 2024-06-13 NOTE — Assessment & Plan Note (Signed)
 Mostly bothering her toes, grade 2.  She did not tolerate Cymbalta .   Stable symptoms She gets acupuncture sessions which help her symptoms.

## 2024-06-13 NOTE — Assessment & Plan Note (Signed)
 Nausea during first 3 days after chemotherapy.  Recommend home antiemetics PRN. She has Zofran  and Compazine . Compazine  is not effective. Zofran  can not be taken within first 3 days.  Add Emend to her premed today Phenergan  25mg  Q6h PRN helped some, but sometimes she may vomit medication out. Recommend switch to Phernergan suppository.  Also add Lorazepem 0.5mg  Q8h PRN for nausea, anxiety, sleep

## 2024-06-14 ENCOUNTER — Encounter: Payer: Self-pay | Admitting: Oncology

## 2024-06-14 ENCOUNTER — Telehealth: Payer: Self-pay | Admitting: Oncology

## 2024-06-14 LAB — CANCER ANTIGEN 19-9: CA 19-9: 38 U/mL — ABNORMAL HIGH (ref 0–35)

## 2024-06-14 LAB — CEA: CEA: 7.3 ng/mL — ABNORMAL HIGH (ref 0.0–4.7)

## 2024-06-14 NOTE — Telephone Encounter (Signed)
 Called pt to sched CT - pt confirmed date/time/location - pt requested appt reminder via mail - LH

## 2024-06-15 ENCOUNTER — Inpatient Hospital Stay

## 2024-06-15 VITALS — BP 115/74 | HR 72 | Temp 99.1°F | Resp 18

## 2024-06-15 DIAGNOSIS — C259 Malignant neoplasm of pancreas, unspecified: Secondary | ICD-10-CM

## 2024-06-15 DIAGNOSIS — Z5111 Encounter for antineoplastic chemotherapy: Secondary | ICD-10-CM | POA: Diagnosis not present

## 2024-07-02 ENCOUNTER — Inpatient Hospital Stay

## 2024-07-02 ENCOUNTER — Inpatient Hospital Stay (HOSPITAL_BASED_OUTPATIENT_CLINIC_OR_DEPARTMENT_OTHER): Admitting: Oncology

## 2024-07-02 ENCOUNTER — Encounter: Payer: Self-pay | Admitting: Oncology

## 2024-07-02 ENCOUNTER — Inpatient Hospital Stay: Attending: Oncology

## 2024-07-02 VITALS — BP 132/76 | HR 57 | Temp 98.9°F | Resp 18 | Wt 131.7 lb

## 2024-07-02 VITALS — BP 136/80 | HR 54 | Temp 98.8°F | Resp 18

## 2024-07-02 DIAGNOSIS — K8689 Other specified diseases of pancreas: Secondary | ICD-10-CM

## 2024-07-02 DIAGNOSIS — Z5111 Encounter for antineoplastic chemotherapy: Secondary | ICD-10-CM | POA: Insufficient documentation

## 2024-07-02 DIAGNOSIS — C259 Malignant neoplasm of pancreas, unspecified: Secondary | ICD-10-CM | POA: Diagnosis not present

## 2024-07-02 DIAGNOSIS — Z452 Encounter for adjustment and management of vascular access device: Secondary | ICD-10-CM | POA: Insufficient documentation

## 2024-07-02 DIAGNOSIS — R110 Nausea: Secondary | ICD-10-CM

## 2024-07-02 DIAGNOSIS — G620 Drug-induced polyneuropathy: Secondary | ICD-10-CM | POA: Diagnosis not present

## 2024-07-02 DIAGNOSIS — C250 Malignant neoplasm of head of pancreas: Secondary | ICD-10-CM | POA: Insufficient documentation

## 2024-07-02 DIAGNOSIS — T451X5A Adverse effect of antineoplastic and immunosuppressive drugs, initial encounter: Secondary | ICD-10-CM

## 2024-07-02 DIAGNOSIS — Z87891 Personal history of nicotine dependence: Secondary | ICD-10-CM | POA: Insufficient documentation

## 2024-07-02 DIAGNOSIS — C787 Secondary malignant neoplasm of liver and intrahepatic bile duct: Secondary | ICD-10-CM | POA: Insufficient documentation

## 2024-07-02 LAB — CBC WITH DIFFERENTIAL (CANCER CENTER ONLY)
Abs Immature Granulocytes: 0.01 K/uL (ref 0.00–0.07)
Basophils Absolute: 0 K/uL (ref 0.0–0.1)
Basophils Relative: 1 %
Eosinophils Absolute: 0.1 K/uL (ref 0.0–0.5)
Eosinophils Relative: 3 %
HCT: 34.9 % — ABNORMAL LOW (ref 36.0–46.0)
Hemoglobin: 11.9 g/dL — ABNORMAL LOW (ref 12.0–15.0)
Immature Granulocytes: 0 %
Lymphocytes Relative: 27 %
Lymphs Abs: 0.9 K/uL (ref 0.7–4.0)
MCH: 32.2 pg (ref 26.0–34.0)
MCHC: 34.1 g/dL (ref 30.0–36.0)
MCV: 94.3 fL (ref 80.0–100.0)
Monocytes Absolute: 0.4 K/uL (ref 0.1–1.0)
Monocytes Relative: 13 %
Neutro Abs: 1.9 K/uL (ref 1.7–7.7)
Neutrophils Relative %: 56 %
Platelet Count: 198 K/uL (ref 150–400)
RBC: 3.7 MIL/uL — ABNORMAL LOW (ref 3.87–5.11)
RDW: 14.1 % (ref 11.5–15.5)
WBC Count: 3.4 K/uL — ABNORMAL LOW (ref 4.0–10.5)
nRBC: 0 % (ref 0.0–0.2)

## 2024-07-02 LAB — CMP (CANCER CENTER ONLY)
ALT: 30 U/L (ref 0–44)
AST: 28 U/L (ref 15–41)
Albumin: 4.2 g/dL (ref 3.5–5.0)
Alkaline Phosphatase: 97 U/L (ref 38–126)
Anion gap: 10 (ref 5–15)
BUN: 10 mg/dL (ref 8–23)
CO2: 23 mmol/L (ref 22–32)
Calcium: 9.2 mg/dL (ref 8.9–10.3)
Chloride: 106 mmol/L (ref 98–111)
Creatinine: 0.72 mg/dL (ref 0.44–1.00)
GFR, Estimated: >60 mL/min
Glucose, Bld: 70 mg/dL (ref 70–99)
Potassium: 4 mmol/L (ref 3.5–5.1)
Sodium: 138 mmol/L (ref 135–145)
Total Bilirubin: 0.3 mg/dL (ref 0.0–1.2)
Total Protein: 6.4 g/dL — ABNORMAL LOW (ref 6.5–8.1)

## 2024-07-02 MED ORDER — ATROPINE SULFATE 1 MG/ML IV SOLN
0.5000 mg | Freq: Once | INTRAVENOUS | Status: AC
  Administered 2024-07-02: 0.5 mg via INTRAVENOUS
  Filled 2024-07-02: qty 1

## 2024-07-02 MED ORDER — SODIUM CHLORIDE 0.9 % IV SOLN
2400.0000 mg/m2 | INTRAVENOUS | Status: DC
  Administered 2024-07-02: 4100 mg via INTRAVENOUS
  Filled 2024-07-02: qty 82

## 2024-07-02 MED ORDER — PALONOSETRON HCL INJECTION 0.25 MG/5ML
0.2500 mg | Freq: Once | INTRAVENOUS | Status: AC
  Administered 2024-07-02: 0.25 mg via INTRAVENOUS
  Filled 2024-07-02: qty 5

## 2024-07-02 MED ORDER — SODIUM CHLORIDE 0.9 % IV SOLN
400.0000 mg/m2 | Freq: Once | INTRAVENOUS | Status: AC
  Administered 2024-07-02: 680 mg via INTRAVENOUS
  Filled 2024-07-02: qty 25

## 2024-07-02 MED ORDER — APREPITANT 130 MG/18ML IV EMUL
130.0000 mg | Freq: Once | INTRAVENOUS | Status: AC
  Administered 2024-07-02: 130 mg via INTRAVENOUS
  Filled 2024-07-02: qty 18

## 2024-07-02 MED ORDER — SODIUM CHLORIDE 0.9 % IV SOLN
125.0000 mg/m2 | Freq: Once | INTRAVENOUS | Status: AC
  Administered 2024-07-02: 200 mg via INTRAVENOUS
  Filled 2024-07-02: qty 9.94

## 2024-07-02 MED ORDER — SODIUM CHLORIDE 0.9 % IV SOLN
INTRAVENOUS | Status: DC
  Filled 2024-07-02: qty 250

## 2024-07-02 MED ORDER — DIPHENHYDRAMINE HCL 25 MG PO TABS
50.0000 mg | ORAL_TABLET | Freq: Once | ORAL | Status: AC
  Administered 2024-07-02: 50 mg via ORAL
  Filled 2024-07-02: qty 2

## 2024-07-02 MED ORDER — DEXAMETHASONE SOD PHOSPHATE PF 10 MG/ML IJ SOLN
10.0000 mg | Freq: Once | INTRAMUSCULAR | Status: AC
  Administered 2024-07-02: 10 mg via INTRAVENOUS

## 2024-07-02 NOTE — Assessment & Plan Note (Signed)
 S/p  Whipple procedure..  Continue Creon , continue 24000 units TID with meals and  with her snacks

## 2024-07-02 NOTE — Assessment & Plan Note (Addendum)
 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  Pathology proved liver metastatic disease- additional chemotherapy --> whipple procedure.ypT1b ypN0--> adjuvant Gem/Abraxane  finished in August 2023--> Sept 2024 CT showed progression in lung --> Nov 2024 Wedge biopsy- Recurrent pancreatic cancer with lung metastasis--> 12/2023 CT stable disease--> 04/03/2024 CT - disease progression -soft tissue close to SMA increased size.  Slightly enlarged lung nodules. Signatera circulating tumor DNA testing- negative.  Currently on 5-FU and Irinotecan . -Irinotecan  dose reduced to 125 mg/m2,  infusion time-180 minutes. Labs are reviewed and discussed with patient. CEA fluctuates, CA 19.9 increased. Dec 2025 Signatera cDNA positive.  Proceed with cycle 5 5-FU and Irinotecan . Shared decision was made to obtain repeat CT after cycle 5 treatment.

## 2024-07-02 NOTE — Assessment & Plan Note (Signed)
 Recommend home antiemetics PRN. She has Zofran  and Compazine . Compazine  is not effective. Zofran  can not be taken within first 3 days.  Add aprepitant  to her premed today Phenergan  25mg   suppository as needed has been helpful..  Lorazepem 0.5mg  Q8h PRN for nausea, anxiety, sleep

## 2024-07-02 NOTE — Patient Instructions (Addendum)
 [facility] - A DEPT OF [facility]  Discharge Instructions: Thank you for choosing [facility] to provide your oncology and hematology care.  If you have a lab appointment with the Cancer Center, please go directly to the Cancer Center and check in at the registration area.  Wear comfortable clothing and clothing appropriate for easy access to any Portacath or PICC line.   We strive to give you quality time with your provider. You may need to reschedule your appointment if you arrive late (15 or more minutes).  Arriving late affects you and other patients whose appointments are after yours.  Also, if you miss three or more appointments without notifying the office, you may be dismissed from the clinic at the providers discretion.      For prescription refill requests, have your pharmacy contact our office and allow 72 hours for refills to be completed.    Today you received the following chemotherapy and/or immunotherapy agents Irinnotecan, Leucovorin , Fluorouracil        To help prevent nausea and vomiting after your treatment, we encourage you to take your nausea medication as directed.  BELOW ARE SYMPTOMS THAT SHOULD BE REPORTED IMMEDIATELY: *FEVER GREATER THAN 100.4 F (38 C) OR HIGHER *CHILLS OR SWEATING *NAUSEA AND VOMITING THAT IS NOT CONTROLLED WITH YOUR NAUSEA MEDICATION *UNUSUAL SHORTNESS OF BREATH *UNUSUAL BRUISING OR BLEEDING *URINARY PROBLEMS (pain or burning when urinating, or frequent urination) *BOWEL PROBLEMS (unusual diarrhea, constipation, pain near the anus) TENDERNESS IN MOUTH AND THROAT WITH OR WITHOUT PRESENCE OF ULCERS (sore throat, sores in mouth, or a toothache) UNUSUAL RASH, SWELLING OR PAIN  UNUSUAL VAGINAL DISCHARGE OR ITCHING   Items with * indicate a potential emergency and should be followed up as soon as possible or go to the Emergency Department if any problems should occur.  Please show the CHEMOTHERAPY  ALERT CARD or IMMUNOTHERAPY ALERT CARD at check-in to the Emergency Department and triage nurse.  Should you have questions after your visit or need to cancel or reschedule your appointment, please contact [facility] - A DEPT OF [facility]  [phone] and follow the prompts.  Office hours are 8:00 a.m. to 4:30 p.m. Monday - Friday. Please note that voicemails left after 4:00 p.m. may not be returned until the following business day.  We are closed weekends and major holidays. You have access to a nurse at all times for urgent questions. Please call the main number to the clinic [phone] and follow the prompts.  For any non-urgent questions, you may also contact your provider using MyChart. We now offer e-Visits for anyone 93 and older to request care online for non-urgent symptoms. For details visit [email or web address].   Also download the MyChart app! Go to the app store, search MyChart, open the app, select [facility], and log in with your MyChart username and password.

## 2024-07-02 NOTE — Assessment & Plan Note (Signed)
 Mostly bothering her toes, grade 2.  She did not tolerate Cymbalta .   Stable symptoms She gets acupuncture sessions which help her symptoms.

## 2024-07-02 NOTE — Assessment & Plan Note (Signed)
 Chemotherapy plan as listed above

## 2024-07-02 NOTE — Progress Notes (Signed)
 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  Pathology proved liver metastatic disease- additional chemotherapy --> whipple procedure.ypT1b ypN0--> adjuvant Gem/Abraxane  finished in August 2023--> Sept 2024 CT showed progression in lung --> Nov 2024 Wedge biopsy- Recurrent pancreatic cancer with lung metastasis--> 12/2023 CT stable disease--> 04/03/2024 CT - disease progression -soft tissue close to SMA increased size.  Slightly enlarged lung nodules. Signatera circulating tumor DNA testing- negative.  Currently on 5-FU and Irinotecan . -Irinotecan  dose reduced to 125 mg/m2,  infusion time-180 minutes. Labs are reviewed and discussed with patient. CEA fluctuates, CA 19.9 increased. Dec 2025 Signatera cDNA positive.  Proceed with cycle 5 5-FU and Irinotecan . Shared decision was made to obtain repeat CT after cycle 5 treatment.     Chemotherapy-induced nausea Recommend home antiemetics PRN. She has Zofran  and Compazine . Compazine  is not effective. Zofran  can not be taken within first 3 days.  Add aprepitant  to her premed today Phenergan  25mg   suppository as needed has been helpful..  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  She did not tolerate Cymbalta .   Stable symptoms She gets acupuncture sessions which help her symptoms.   Encounter for antineoplastic chemotherapy Chemotherapy plan as listed  above  Hypocalcemia Likely due to malabsorption. Continue calcium  supplementation, 2000 mg daily-divided in 2-3 doses IV calcium  gluconate 2g if calcium  <=8.5 Calcium  level has been stable  Pancreatic insufficiency S/p  Whipple procedure..  Continue Creon , continue 24000 units TID with meals and  with her snacks     No orders of the defined types were placed in this encounter.   Follow up  2 weeks lab MD FOLFIRI  All questions were answered. The patient knows to call the clinic with any problems, questions or concerns.  Call Babara, MD, PhD [facility] Hematology Oncology 07/02/2024   Babara Call, MD    HISTORY OF PRESENTING ILLNESS:   Cassandra [name] is a  69 y.o.  female with presents for follow up of Stage IV pancreatic adenocarcinoma Patient initially presented with jaundice, transaminitis, bilirubin was 9.9.  CA 19-9 was 1874.  Patient also reports unintentional weight loss. 02/08/2020 MRI abdomen and MRCP with and without contrast was done at [facility] which showed pancreatic head mass measuring up to 3 cm, with marked associated narrowing of the portal vein confluence.  SMA is preserved.  Marked intrahepatic and extrahepatic biliary duct dilatation as well as mild dilatation of the main pancreatic duct. Multiple hepatic masses highly concerning for metastatic disease.  Patient underwent EUS on 02/19/2020, which showed irregular mass identified in the pancreatic head, hypoechoic, measured 73mmx33mm, sonographic evidence concerning for invasion into the superior mesenteric artery.  There is no sign of significant abnormality in the main pancreatic duct.  Dilatation of common bile duct which measured up to 16 mm.  Region of celiac artery was visualized and showed no signs of significant abnormality.  No lymphadenopathy.  FNA showed adenocarcinoma.  02/19/2020, ERCP, malignant.  Biliary stricture was found at the mid/lower third of the medial bile  duct with upstream ductal dilatation.  The  stricture was treated with placement of wall flex metal stent.  Patient was seen by [facility] oncology Dr. [name] and was recommended for 3 drug regimen FOLFIRINOX.  Patient prefers to do chemotherapy locally at [facility].  Patient was referred to establish care today. She denies any pain.  Since stent placement, skin jaundice has improved.  Itchiness has also improved. Patient was accompanied by her husband today.  She has a family history of breast cancer in sister and paternal aunt, colon cancer paternal grandmother.  #No reportable targetable mutation on NGS 9/14/2021cycle 1 FOLFIRINOX.  Patient received oxaliplatin  and about 50% of Irinotecan  on day 1 and had experienced neurologic symptoms.  She went to ER and working diagnosis is TIA, and eventually I think this is due to irinotecan  side effects -irinotecan -associated dysarthria, lip/tongue numbness.  Adjustment was made for Irinotecan  to be  infused over 180 minutes.  Atropine  0.5 mg once prior to the irinotecan . No recurrent symptoms.   # 03/19/2020-03/21/2020 patient was admitted due to sepsis with strep pneumonia bacteremia.  Patient was treated with IV Rocephin .  TEE was done which showed no vegetation.  No PFO or ASD.  Patient was discharged home and he finished full course of 14 days of IV Rocephin  on 04/02/2020 per ID recommendation.  Repeat blood culture was also negative.  08/25/20 cycle 10 FOLFIRINOX 09/08/20- present  Starting cycle 11, FOLFIRI, Oxaliplatin  discontinued due to neuropathy   #NGS showed no reportable targetable mutation #Genetic testing-[employer] diagnostic testing showed no pathological variants identified. MS stable, TMB 0mut/mb, KRAS G12D, SF3B1 K700E, TP53 V25fs*30  #01/14/2021  CT done at [facility] during the interval, stable disease.  # Her case was presented by Dr.Jia from [facility] tumor board. [name] were 4~5 liver lesions on OSH MRI last year at the time of diagnosis highly suspicious for metastasis. She is not eligible for  surgical protocol for metastasis resection given the number of liver metastasis is >3. However given her excellent and durable response to chemo, surgical resection may be considered pending sustained disease control at 1 year and may require partial hepatectomy first to see if there is viable tumor before proceeding to Whipple resection ]  Patient had a COVID-19 infection in September 2022.  03/23/2021 CT abdomen pelvis No significant change in the ill-defined pancreatic head mass or  associated biliary ductal and pancreatic ductal dilation.  Slight interval enlargement of subcentimeter anterior peripancreatic  lymph node, nonspecific. Attention on follow-up per clinical protocol. Similar enlargement of the ascending thoracic aorta measuring 4.4 cm  03/23/2021 MRI abdomen w and wo  Increasing ill-defined hypoenhancement of the pancreatic head surrounding the common bile duct stent, in keeping with known pancreatic malignancy. Anterior peripancreatic lymph node is better evaluated on CT.  Unchanged position of a common bile duct stent with similar mild diffuse intrahepatic ductal dilation and pneumobilia. No evidence of metastatic disease in the abdomen or pelvis. Partially visualized cystic left adnexal lesion measuring up to 3.9 cm.   04/10/2021, patient underwent liver resection at [facility], by Dr. Zani.   Pathology report from [facility] was reviewed. Segments 3 partial hepatectomy, negative for viable tumor. Section 5/6 partial hepatectomy part 1 and 2, microscopic foci of residual viable adenocarcinoma, morphologically consistent with history of pancreatic primary.  Parenchymal margin is uninvolved.  #Patient resumed on FOLFIRI on 04/28/2021. She got another cycle on 05/12/2021 #05/26/2021, patient did not get additional chemotherapy due to transaminitis.  Shared decision was made to stop FOLFIRI and  switch to gemcitabine  Abraxane  treatments.  05/26/2021, patient developed transaminitis, AST 763, ALT  691, alkaline phosphatase 364.  Bilirubin 0.7 05/26/2021 stat ultrasound abdomen right upper quadrant showed interval development of 3.3 x 1.8 cm complex mass in the right hepatic lobe.  Increased extrahepatic ductal dilatation.  Concerning for CBD obstruction. 05/29/2021, MRI abdomen MRCP with and without contrast showed unchanged pancreatic head soft tissue.  Severe intra and extrahepatic biliary ductal dilatation.  Common bile duct stents remain in position however patency is not established.  No evidence of lymphadenopathy or metastatic disease in the abdomen.  With further communication with radiologist, addendum was added that there is internal fluid signal and no appreciable associated contrast enhancement measuring 3.4 x 2.3 cm.  This appearance is generally not consistent with metastasis and is of uncertain significance.  Possibly reflecting hepatic abscess or residual of subcapsular hematoma.  Patient was seen by [facility] Dr. Oneal team.  He had ERCP done on 06/05/2021, with findings of One stent from the biliary tree was seen in the major papilla. The stent had migrated significantly into the duodenum. This is the cause of stent malfunction. One stent was removed from the biliary tree. Prior biliary sphincterotomy appeared open. - A single severe biliary stricture was found in the lower third of the main bile duct with upstream dilation. The stricture was alignant appearing.- The biliary tree was swept and sludge was found.- One fully covered metal stent was placed into the common bile duct across the stricture.- No pancreatogram performed.  06/12/2021-08/10/2021 gemcitabine  and Abraxane .   10/14/2021, patient underwent Whipple procedure. Liver biopsy negative for malignancy. Review procedure showed invasive adenocarcinoma, moderately differentiated, centered in pancreatic head and confined in the Pancreas. Surgical margin is negative for malignancy.  36 lymph nodes were all negative for malignancy.   1 hepatic artery lymph node was harvested and was negative.  Gallbladder negative for malignancy.  Cystic duct excision negative for malignancy. pT1b pN0  10/25/2021, CT abdomen pelvis with contrast showed rim-enhancing fluid collection in the region of the hepatic hilum. Patient had a readmission for superficial wound infection.  She completed antibiotics on 11/03/2021.  11/03/2021, CT abdomen pelvis with contrast showed near resolution of previously seen fluid collection in the region of the hepatic hilum.  Similar appearance of the area of hypoenhancement in the liver.  Attention on follow-up.  11/17/2021, resumed on gemcitabine /Abraxane . 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  12/12/ 2023 CT scan at [facility] shows no evidence of cancer recurrence.-NED 09/21/2022 CT chest abdomen pelvis w contrast at [facility] showed-No evidence of metastatic disease in the chest, abdomen, or pelvis. 12/21/2022 CT chest abdomen pelvis w contrast at [facility] showed-No evidence of metastatic disease in the chest, abdomen, or pelvis.   03/22/2023 CT scan at [facility] showed enlarging lung nodules. RLL 1cm, LLL 0.5cm, RUL 0.5cm Stable hypoattenuating right inferior hepatic lobe lesion measuring 0.9 cm.  Similar left adnexal cystic lesion measuring up to 5.0 cm.   04/06/2023 s/p lung nodule biopsy.  Rare atypical epithelioid cells present with evidence suggestive of invasion, suspicious but not diagnostic for malignancy.   05/20/2023: Right robot assisted thoracoscopic wedge resection with MLND (Dr. Murlean)   Pathology:  Suspicious for malignancy but not diagnostic A. Lymph node, level 8, biopsy: Metastatic carcinoma involving one lymph node (1/1).  B. Lung, right lower lobe, wedge resection: Lung tissue positive for adenocarcinoma, consistent with metastasis from pancreas primary. See comment. Tumor size: 1.0 cm. Operative margin: Negative  Comment: Note is made  of the patient's history of invasive adenocarcinoma of the  pancreatic head ([id number]; 10/14/2021), which is compared to the current case. Comparison is challenging given the post-treatment nature of the pancreatic tumor; however, there are some morphologic similarities.  Although often unhelpful in the setting of lung primary versus pancreatic primary, immunohistochemical stains were performed. Tumor cells demonstrate the below immunoprofile which would could support pulmonary adenocarcinoma with enteric differentiation but would also be consistent with metastasis from the patient's known pancreatic primary; the latter is favored.              Positive                             Negative CK7 (strong, diffuse) TTF-1  CK20 (strong, focal)                    Positive and negative controls perform appropriately.    C. Lymph node, level 7, biopsy: Metastatic carcinoma involving one lymph node (1/1).  D. Lymph node, level 4R, biopsy: One lymph node, negative for malignancy (0/1).   04/03/2024, CT chest abdomen pelvis with contrast showed Enlargement of soft tissue nodule adjacent to the right aspect of the superior mesenteric artery.  Multiple small bilateral pulmonary nodules, slightly enlarged.   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  She gets acupuncture session which helped her symptoms.  INTERVAL HISTORY Cassandra [name] is a 69 y.o. female who has above history reviewed by me today presents for stage IV pancreatic cancer.   Overall she tolerated with FOLFIRI Postchemotherapy nausea is better controlled with Phenergan  suppository as needed. She takes calcium  supplementation as instructed.  She take Creon  for pancreatic insufficiency  She denies abdominal pain diarrhea.      Review of Systems  Constitutional:  Positive for fatigue. Negative for appetite change, chills, fever and unexpected weight change.  HENT:   Negative for hearing loss and voice change.   Eyes:  Negative for eye  problems.  Respiratory:  Negative for chest tightness and cough.   Cardiovascular:  Negative for chest pain.  Gastrointestinal:  Positive for nausea. Negative for abdominal distention, abdominal pain and blood in stool.  Endocrine: Negative for hot flashes.  Genitourinary:  Negative for difficulty urinating and frequency.   Musculoskeletal:  Positive for arthralgias.  Skin:  Negative for itching and rash.  Neurological:  Positive for numbness. Negative for extremity weakness.  Hematological:  Negative for adenopathy.  Psychiatric/Behavioral:  Negative for confusion.     MEDICAL HISTORY:  Past Medical History:  Diagnosis Date   Allergy    Anemia    Bacteremia due to Streptococcus pneumoniae 03/19/2020   Cancer [facility])    pancreatic cancer   Colon polyps    Family history of breast cancer    Neuropathy due to drug    chemo induced   Neutropenia 04/18/2020   Osteopenia after menopause 05/2017   femoral neck T score -2.0   Personal history of chemotherapy    current for pancreatic ca   PONV (postoperative nausea and vomiting)    Pure hypercholesterolemia    Sepsis (HCC) 03/19/2020    SURGICAL HISTORY: Past Surgical History:  Procedure Laterality Date   COLONOSCOPY  07/2015   WNL   COLONOSCOPY  2008/2011   COLONOSCOPY WITH PROPOFOL  N/A 07/10/2021   Procedure: COLONOSCOPY WITH PROPOFOL ;  Surgeon: Maryruth Ole DASEN, MD;  Location: [facility]  ENDOSCOPY;  Service: Endoscopy;  Laterality: N/A;   IR CV LINE INJECTION  04/28/2021   OVARIAN CYST REMOVAL  1992   dermoid-Dr CAK   PORTA CATH INSERTION N/A 03/07/2020   Procedure: PORTA CATH INSERTION;  Surgeon: Marea Selinda RAMAN, MD;  Location: [facility] INVASIVE CV LAB;  Service: Cardiovascular;  Laterality: N/A;   TEE WITHOUT CARDIOVERSION N/A 03/21/2020   Procedure: TRANSESOPHAGEAL ECHOCARDIOGRAM (TEE);  Surgeon: Fernand Denyse LABOR, MD;  Location: [facility] ORS;  Service: Cardiovascular;  Laterality: N/A;   TUBAL LIGATION  1993    SOCIAL  HISTORY: Social History   Socioeconomic History   Marital status: Married    Spouse name: Not on file   Number of children: 2   Years of education: Not on file   Highest education level: Bachelor's degree (e.g., BA, AB, BS)  Occupational History   Occupation: Runner, Broadcasting/film/video    Comment: retired   Occupation: Visual Merchandiser  Tobacco Use   Smoking status: Former    Current packs/day: 0.00    Average packs/day: 1 pack/day for 10.0 years (10.0 ttl pk-yrs)    Types: Cigarettes    Start date: 06/29/1975    Quit date: 06/28/1985    Years since quitting: 39.0   Smokeless tobacco: Never   Tobacco comments:    Quit smoking 1987  Vaping Use   Vaping status: Never Used  Substance and Sexual Activity   Alcohol use: Yes    Alcohol/week: 5.0 standard drinks of alcohol    Types: 5 Glasses of wine per week    Comment: 0-2 mixed drinks a day   Drug use: No   Sexual activity: Not Currently    Birth control/protection: Post-menopausal  Other Topics Concern   Not on file  Social History Narrative   Not on file   Social Drivers of Health   Tobacco Use: Medium Risk (07/02/2024)   Patient History    Smoking Tobacco Use: Former    Smokeless Tobacco Use: Never    Passive Exposure: Not on file  Financial Resource Strain: Low Risk  (04/07/2024)   Received from [facility]   Overall Financial Resource Strain (CARDIA)    Difficulty of Paying Living Expenses: Not hard at all  Food Insecurity: No Food Insecurity (04/07/2024)   Received from [facility]   Epic    Within the past 12 months, you worried that your food would run out before you got the money to buy more.: Never true    Within the past 12 months, the food you bought just didn't last and you didn't have money to get more.: Never true  Transportation Needs: No Transportation Needs (04/07/2024)   Received from [facility] - Transportation    In the past 12 months, has lack of transportation  kept you from medical appointments or from getting medications?: No    Lack of Transportation (Non-Medical): No  Physical Activity: Sufficiently Active (10/18/2023)   Exercise Vital Sign    Days of Exercise per Week: 6 days    Minutes of Exercise per Session: 30 min  Stress: No Stress Concern Present (10/18/2023)   [employer] of Occupational Health - Occupational Stress Questionnaire    Feeling of Stress : Not at all  Social Connections: Moderately Integrated (10/18/2023)   Social Connection and Isolation Panel    Frequency of Communication with Friends and Family: More than three times a week    Frequency of Social Gatherings with Friends and Family: More  than three times a week    Attends Religious Services: More than 4 times per year    Active Member of Clubs or Organizations: No    Attends Banker Meetings: Never    Marital Status: Married  Catering Manager Violence: Not At Risk (10/18/2023)   Humiliation, Afraid, Rape, and Kick questionnaire    Fear of Current or Ex-Partner: No    Emotionally Abused: No    Physically Abused: No    Sexually Abused: No  Depression (PHQ2-9): Low Risk (07/02/2024)   Depression (PHQ2-9)    PHQ-2 Score: 0  Alcohol Screen: Low Risk (10/18/2023)   Alcohol Screen    Last Alcohol Screening Score (AUDIT): 4  Housing: Low Risk  (04/07/2024)   Received from [facility]   Epic    In the last 12 months, was there a time when you were not able to pay the mortgage or rent on time?: No    In the past 12 months, how many times have you moved where you were living?: 0    At any time in the past 12 months, were you homeless or living in a shelter (including now)?: No  Utilities: Not At Risk (04/07/2024)   Received from [facility]   Epic    In the past 12 months has the electric, gas, oil, or water company threatened to shut off services in your home?: No  Health Literacy: Adequate Health Literacy (10/18/2023)    B1300 Health Literacy    Frequency of need for help with medical instructions: Never    FAMILY HISTORY: Family History  Problem Relation Age of Onset   Breast cancer Paternal Aunt 56   Diabetes Mother    Osteoporosis Mother    Hyperlipidemia Father    Rheumatic fever Father    Valvular heart disease Father    Cancer Paternal Grandmother        possible colon   Breast cancer Sister 63    ALLERGIES:  is allergic to irinotecan  and penicillin g.  MEDICATIONS:  Current Outpatient Medications  Medication Sig Dispense Refill   b complex vitamins capsule Take 1 capsule by mouth daily.     Calcium  Carbonate-Vit D-Min (CALCIUM  1200 PO) Take by mouth.     Cholecalciferol  25 MCG (1000 UT) tablet Take 2,000 Units by mouth daily.     clobetasol cream (TEMOVATE) 0.05 %      fluticasone (FLONASE) 50 MCG/ACT nasal spray Place 1 spray into both nostrils daily.     lidocaine -prilocaine  (EMLA ) cream Apply small amount to port and cover with saran wrap 1-2 hours prior to port access 30 g 6   loperamide  (IMODIUM ) 2 MG capsule Take 2 tabs by mouth with first loose stool, then 1 tab with each additional loose stool as needed. Do not exceed 8 tabs in a 24-hour period 60 capsule 3   loratadine (CLARITIN REDITABS) 10 MG dissolvable tablet Take 10 mg by mouth daily.     LORazepam  (ATIVAN ) 0.5 MG tablet Take 1 tablet (0.5 mg total) by mouth every 8 (eight) hours as needed for anxiety or sleep (nausea vomiting). 30 tablet 0   Melatonin 2.5 MG CHEW Chew by mouth.     Multiple Vitamins-Minerals (MULTIVITAMIN WITH MINERALS) tablet Take 1 tablet by mouth daily.     ondansetron  (ZOFRAN ) 8 MG tablet Take 1 tablet (8 mg total) by mouth every 8 (eight) hours as needed for nausea, vomiting or refractory nausea / vomiting. Start on the  third day after chemotherapy. 30 tablet 1   Pancrelipase , Lip-Prot-Amyl, (CREON ) 24000-76000 units CPEP TAKE 1 CAPSULE THREE TIMES DAILY BEFORE MEALS 200 capsule 3   promethazine   (PHENERGAN ) 25 MG suppository Place 1 suppository (25 mg total) rectally every 6 (six) hours as needed for nausea or vomiting. 30 each 0   senna-docusate (SENOKOT-S) 8.6-50 MG tablet Take by mouth.     simethicone  (GAS-X) 80 MG chewable tablet Chew 1 tablet (80 mg total) by mouth every 8 (eight) hours as needed for flatulence. 60 tablet 0   No current facility-administered medications for this visit.   Facility-Administered Medications Ordered in Other Visits  Medication Dose Route Frequency Provider Last Rate Last Admin   0.9 %  sodium chloride  infusion   Intravenous Continuous Babara Call, MD   Stopped at 07/02/24 1502   fluorouracil  (ADRUCIL ) 4,100 mg in sodium chloride  0.9 % 68 mL chemo infusion  2,400 mg/m2 (Order-Specific) Intravenous 1 day or 1 dose Babara Call, MD   Infusion Verify at 07/02/24 1518   heparin  lock flush 100 UNIT/ML injection            prochlorperazine  (COMPAZINE ) 10 MG tablet            sodium chloride  flush (NS) 0.9 % injection 10 mL  10 mL Intravenous PRN Babara Call, MD   10 mL at 02/18/21 0858   sodium chloride  flush (NS) 0.9 % injection 10 mL  10 mL Intravenous Once Borders, Joshua R, NP       sodium chloride  flush (NS) 0.9 % injection 10 mL  10 mL Intravenous Once Babara Call, MD         PHYSICAL EXAMINATION: ECOG PERFORMANCE STATUS: 0 - Asymptomatic Vitals:   07/02/24 0919  BP: 132/76  Pulse: (!) 57  Resp: 18  Temp: 98.9 F (37.2 C)   Filed Weights   07/02/24 0919  Weight: 131 lb 11.2 oz (59.7 kg)     Physical Exam Constitutional:      General: She is not in acute distress. HENT:     Head: Normocephalic and atraumatic.  Eyes:     General: No scleral icterus. Cardiovascular:     Rate and Rhythm: Normal rate and regular rhythm.     Heart sounds: Normal heart sounds.  Pulmonary:     Effort: Pulmonary effort is normal. No respiratory distress.  Abdominal:     General: Bowel sounds are normal. There is no distension.     Palpations: Abdomen is soft.   Musculoskeletal:        General: No deformity. Normal range of motion.     Cervical back: Normal range of motion and neck supple.  Skin:    General: Skin is warm.     Coloration: Skin is not jaundiced.     Findings: No erythema.  Neurological:     Mental Status: She is alert and oriented to person, place, and time. Mental status is at baseline.  Psychiatric:        Mood and Affect: Mood normal.     LABORATORY DATA:  I have reviewed the data as listed    Latest Ref Rng & Units 07/02/2024    8:53 AM 06/13/2024    8:52 AM 05/30/2024    8:41 AM  CBC  WBC 4.0 - 10.5 K/uL 3.4  4.1  3.4   Hemoglobin 12.0 - 15.0 g/dL 88.0  88.0  88.0   Hematocrit 36.0 - 46.0 % 34.9  35.4  34.4   Platelets  150 - 400 K/uL 198  189  186       Latest Ref Rng & Units 07/02/2024    8:53 AM 06/13/2024    8:52 AM 05/30/2024    8:41 AM  CMP  Glucose 70 - 99 mg/dL 70  863  899   BUN 8 - 23 mg/dL 10  15  15    Creatinine 0.44 - 1.00 mg/dL 9.27  9.23  9.27   Sodium 135 - 145 mmol/L 138  141  141   Potassium 3.5 - 5.1 mmol/L 4.0  3.7  3.9   Chloride 98 - 111 mmol/L 106  107  107   CO2 22 - 32 mmol/L 23  22  23    Calcium  8.9 - 10.3 mg/dL 9.2  9.1  9.0   Total Protein 6.5 - 8.1 g/dL 6.4  6.3  6.2   Total Bilirubin 0.0 - 1.2 mg/dL 0.3  0.5  0.4   Alkaline Phos 38 - 126 U/L 97  87  92   AST 15 - 41 U/L 28  29  29    ALT 0 - 44 U/L 30  28  24       RADIOGRAPHIC STUDIES: I have personally reviewed the radiological images as listed and agreed with the findings in the report. Reviewed findings of MRI abdomen MRCP done at [facility]. No results found.

## 2024-07-02 NOTE — Assessment & Plan Note (Signed)
 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 Continue calcium  supplementation, 2000 mg daily-divided in 2-3 doses IV calcium  gluconate 2g if calcium  <=8.5 Calcium  level has been stable

## 2024-07-03 LAB — CEA: CEA: 6.9 ng/mL — ABNORMAL HIGH (ref 0.0–4.7)

## 2024-07-03 LAB — CANCER ANTIGEN 19-9: CA 19-9: 39 U/mL — ABNORMAL HIGH (ref 0–35)

## 2024-07-04 ENCOUNTER — Inpatient Hospital Stay

## 2024-07-05 ENCOUNTER — Telehealth: Payer: Self-pay | Admitting: Oncology

## 2024-07-05 NOTE — Telephone Encounter (Signed)
 Called pt to confirm CT appt for 1/13 - left vm w/appt info and my number incase pt has questions or needs to r/s - [facility]

## 2024-07-10 ENCOUNTER — Ambulatory Visit
Admission: RE | Admit: 2024-07-10 | Discharge: 2024-07-10 | Disposition: A | Discharge status: Home / Self Care | Source: Ambulatory Visit | Attending: Oncology | Admitting: Oncology

## 2024-07-10 DIAGNOSIS — C259 Malignant neoplasm of pancreas, unspecified: Secondary | ICD-10-CM | POA: Diagnosis present

## 2024-07-10 MED ORDER — IOHEXOL 300 MG/ML  SOLN
100.0000 mL | Freq: Once | INTRAMUSCULAR | Status: AC | PRN
  Administered 2024-07-10: 100 mL via INTRAVENOUS

## 2024-07-16 ENCOUNTER — Encounter: Payer: Self-pay | Admitting: Oncology

## 2024-07-16 ENCOUNTER — Telehealth: Payer: Self-pay

## 2024-07-16 ENCOUNTER — Inpatient Hospital Stay

## 2024-07-16 ENCOUNTER — Inpatient Hospital Stay: Admitting: Oncology

## 2024-07-16 VITALS — BP 141/77 | HR 56 | Temp 97.5°F | Resp 19

## 2024-07-16 VITALS — BP 136/80 | HR 63 | Temp 98.6°F | Resp 20 | Wt 131.5 lb

## 2024-07-16 DIAGNOSIS — R110 Nausea: Secondary | ICD-10-CM

## 2024-07-16 DIAGNOSIS — G620 Drug-induced polyneuropathy: Secondary | ICD-10-CM | POA: Diagnosis not present

## 2024-07-16 DIAGNOSIS — C259 Malignant neoplasm of pancreas, unspecified: Secondary | ICD-10-CM

## 2024-07-16 DIAGNOSIS — T451X5A Adverse effect of antineoplastic and immunosuppressive drugs, initial encounter: Secondary | ICD-10-CM | POA: Diagnosis not present

## 2024-07-16 DIAGNOSIS — K8689 Other specified diseases of pancreas: Secondary | ICD-10-CM | POA: Diagnosis not present

## 2024-07-16 DIAGNOSIS — Z5111 Encounter for antineoplastic chemotherapy: Secondary | ICD-10-CM

## 2024-07-16 LAB — CBC WITH DIFFERENTIAL (CANCER CENTER ONLY)
Abs Immature Granulocytes: 0.01 K/uL (ref 0.00–0.07)
Basophils Absolute: 0 K/uL (ref 0.0–0.1)
Basophils Relative: 1 %
Eosinophils Absolute: 0.1 K/uL (ref 0.0–0.5)
Eosinophils Relative: 2 %
HCT: 35.4 % — ABNORMAL LOW (ref 36.0–46.0)
Hemoglobin: 12.1 g/dL (ref 12.0–15.0)
Immature Granulocytes: 0 %
Lymphocytes Relative: 31 %
Lymphs Abs: 1.1 K/uL (ref 0.7–4.0)
MCH: 32.6 pg (ref 26.0–34.0)
MCHC: 34.2 g/dL (ref 30.0–36.0)
MCV: 95.4 fL (ref 80.0–100.0)
Monocytes Absolute: 0.3 K/uL (ref 0.1–1.0)
Monocytes Relative: 9 %
Neutro Abs: 2 K/uL (ref 1.7–7.7)
Neutrophils Relative %: 57 %
Platelet Count: 186 K/uL (ref 150–400)
RBC: 3.71 MIL/uL — ABNORMAL LOW (ref 3.87–5.11)
RDW: 14.2 % (ref 11.5–15.5)
WBC Count: 3.5 K/uL — ABNORMAL LOW (ref 4.0–10.5)
nRBC: 0 % (ref 0.0–0.2)

## 2024-07-16 LAB — CMP (CANCER CENTER ONLY)
ALT: 35 U/L (ref 0–44)
AST: 31 U/L (ref 15–41)
Albumin: 4.3 g/dL (ref 3.5–5.0)
Alkaline Phosphatase: 95 U/L (ref 38–126)
Anion gap: 11 (ref 5–15)
BUN: 13 mg/dL (ref 8–23)
CO2: 24 mmol/L (ref 22–32)
Calcium: 9.2 mg/dL (ref 8.9–10.3)
Chloride: 106 mmol/L (ref 98–111)
Creatinine: 0.83 mg/dL (ref 0.44–1.00)
GFR, Estimated: >60 mL/min
Glucose, Bld: 111 mg/dL — ABNORMAL HIGH (ref 70–99)
Potassium: 3.7 mmol/L (ref 3.5–5.1)
Sodium: 141 mmol/L (ref 135–145)
Total Bilirubin: 0.5 mg/dL (ref 0.0–1.2)
Total Protein: 6.6 g/dL (ref 6.5–8.1)

## 2024-07-16 MED ORDER — SODIUM CHLORIDE 0.9 % IV SOLN
125.0000 mg/m2 | Freq: Once | INTRAVENOUS | Status: AC
  Administered 2024-07-16: 200 mg via INTRAVENOUS
  Filled 2024-07-16: qty 10

## 2024-07-16 MED ORDER — APREPITANT 130 MG/18ML IV EMUL
130.0000 mg | Freq: Once | INTRAVENOUS | Status: AC
  Administered 2024-07-16: 130 mg via INTRAVENOUS
  Filled 2024-07-16: qty 18

## 2024-07-16 MED ORDER — ALTEPLASE 2 MG IJ SOLR
2.0000 mg | Freq: Once | INTRAMUSCULAR | Status: AC | PRN
  Administered 2024-07-16: 2 mg
  Filled 2024-07-16: qty 2

## 2024-07-16 MED ORDER — SODIUM CHLORIDE 0.9 % IV SOLN
INTRAVENOUS | Status: DC
  Filled 2024-07-16: qty 250

## 2024-07-16 MED ORDER — ATROPINE SULFATE 1 MG/ML IV SOLN
0.5000 mg | Freq: Once | INTRAVENOUS | Status: AC
  Administered 2024-07-16: 0.5 mg via INTRAVENOUS
  Filled 2024-07-16: qty 1

## 2024-07-16 MED ORDER — SODIUM CHLORIDE 0.9 % IV SOLN
400.0000 mg/m2 | Freq: Once | INTRAVENOUS | Status: AC
  Administered 2024-07-16: 680 mg via INTRAVENOUS
  Filled 2024-07-16: qty 25

## 2024-07-16 MED ORDER — SODIUM CHLORIDE 0.9 % IV SOLN
2400.0000 mg/m2 | INTRAVENOUS | Status: DC
  Administered 2024-07-16: 4100 mg via INTRAVENOUS
  Filled 2024-07-16: qty 82

## 2024-07-16 MED ORDER — DEXAMETHASONE SOD PHOSPHATE PF 10 MG/ML IJ SOLN
10.0000 mg | Freq: Once | INTRAMUSCULAR | Status: AC
  Administered 2024-07-16: 10 mg via INTRAVENOUS
  Filled 2024-07-16: qty 1

## 2024-07-16 MED ORDER — DIPHENHYDRAMINE HCL 25 MG PO TABS
50.0000 mg | ORAL_TABLET | Freq: Once | ORAL | Status: AC
  Administered 2024-07-16: 50 mg via ORAL
  Filled 2024-07-16: qty 2

## 2024-07-16 MED ORDER — PALONOSETRON HCL INJECTION 0.25 MG/5ML
0.2500 mg | Freq: Once | INTRAVENOUS | Status: AC
  Administered 2024-07-16: 0.25 mg via INTRAVENOUS
  Filled 2024-07-16: qty 5

## 2024-07-16 NOTE — Progress Notes (Signed)
 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  Pathology proved liver metastatic disease- additional chemotherapy --> whipple procedure.ypT1b ypN0--> adjuvant Gem/Abraxane  finished in August 2023--> Sept 2024 CT showed progression in lung --> Nov 2024 Wedge biopsy- Recurrent pancreatic cancer with lung metastasis--> --> Gem Abraxane  -->12/2023 CT stable disease--> 04/03/2024 CT - disease progression -soft tissue close to SMA increased size.  Slightly enlarged lung nodules.-->  04/25/24 5-FU/irinotecan  Signatera circulating tumor DNA testing- negative.  Currently on 5-FU and Irinotecan . -Irinotecan  dose reduced to 125 mg/m2,  infusion time-180 minutes. Labs are reviewed and discussed with patient. CEA fluctuates, CA 19.9 increased. Dec 2025 Signatera cDNA positive.  Jan 2025 CT chest abdomen pelvis w contrast showed slight enlargement of superior mesenteric artery soft tissue nodule and multiple lung nodules, consistent with disease progression.  Proceed with cycle 6 5-FU and Irinotecan .  Recommend patient to reestablish care with [facility] oncology for evaluation of feasibility of clinical trials.    Chemotherapy-induced nausea Recommend home antiemetics PRN. She has Zofran  and Compazine . Compazine  is not effective. Zofran  can not be taken within first 3 days.  Add aprepitant  to her premed today Phenergan  25mg   suppository as needed has been helpful..  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  She did not tolerate Cymbalta .   Stable symptoms She gets acupuncture sessions which help her symptoms.   Encounter for antineoplastic chemotherapy Chemotherapy plan as listed above  Hypocalcemia Likely due to malabsorption. Continue calcium  supplementation, 2000 mg daily-divided in 2-3 doses IV calcium  gluconate 2g if calcium  <=8.5 Calcium  level has been stable  Pancreatic insufficiency S/p  Whipple procedure..  Continue Creon , continue 24000 units TID with meals and  with her snacks     Orders Placed This Encounter  Procedures   CEA    Standing Status:   Future    Expected Date:   07/30/2024    Expiration Date:   07/30/2025   Cancer antigen 19-9    Standing Status:   Future    Expected Date:   07/30/2024    Expiration Date:   07/30/2025   CBC with Differential (Cancer Center Only)    Standing Status:   Future    Expected Date:   07/30/2024    Expiration Date:   07/30/2025   CMP (Cancer Center only)    Standing Status:   Future    Expected Date:   07/30/2024    Expiration Date:   07/30/2025    Follow up  2 weeks lab MD FOLFIRI  All questions were answered. The patient knows to call the clinic with any problems, questions or concerns.  Call Babara, MD, PhD [facility] Hematology Oncology 07/16/2024   Babara Call, MD    HISTORY OF PRESENTING ILLNESS:   Cassandra [name] is a  69 y.o.  female with presents for follow up of Stage IV pancreatic adenocarcinoma Patient initially presented with jaundice, transaminitis, bilirubin was 9.9.  CA 19-9 was 1874.  Patient also reports unintentional weight loss. 02/08/2020 MRI abdomen  and MRCP with and without contrast was done at [facility] which showed pancreatic head mass measuring up to 3 cm, with marked associated narrowing of the portal vein confluence.  SMA is preserved.  Marked intrahepatic and extrahepatic biliary duct dilatation as well as mild dilatation of the  main pancreatic duct. Multiple hepatic masses highly concerning for metastatic disease.  Patient underwent EUS on 02/19/2020, which showed irregular mass identified in the pancreatic head, hypoechoic, measured 15mmx33mm, sonographic evidence concerning for invasion into the superior mesenteric artery.  There is no sign of significant abnormality in the main pancreatic duct.  Dilatation of common bile duct which measured up to 16 mm.  Region of celiac artery was visualized and showed no signs of significant abnormality.  No lymphadenopathy.  FNA showed adenocarcinoma.  02/19/2020, ERCP, malignant.  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  The stricture was treated with placement of wall flex metal stent.  Patient was seen by [facility] oncology Dr. [name] and was recommended for 3 drug regimen FOLFIRINOX.  Patient prefers to do chemotherapy locally at [facility].  Patient was referred to establish care today. She denies any pain.  Since stent placement, skin jaundice has improved.  Itchiness has also improved. Patient was accompanied by her husband today.  She has a family history of breast cancer in sister and paternal aunt, colon cancer paternal grandmother.  #No reportable targetable mutation on NGS 9/14/2021cycle 1 FOLFIRINOX.  Patient received oxaliplatin  and about 50% of Irinotecan  on day 1 and had experienced neurologic symptoms.  She went to ER and working diagnosis is TIA, and eventually I think this is due to irinotecan  side effects -irinotecan -associated dysarthria, lip/tongue numbness.  Adjustment was made for Irinotecan  to be  infused over 180 minutes.  Atropine  0.5 mg once prior to the irinotecan . No recurrent symptoms.   # 03/19/2020-03/21/2020 patient was admitted due to sepsis with strep pneumonia bacteremia.  Patient was treated with IV Rocephin .  TEE was done which showed no vegetation.  No PFO or ASD.  Patient was discharged home and  he finished full course of 14 days of IV Rocephin  on 04/02/2020 per ID recommendation.  Repeat blood culture was also negative.  08/25/20 cycle 10 FOLFIRINOX 09/08/20- present  Starting cycle 11, FOLFIRI, Oxaliplatin  discontinued due to neuropathy   #NGS showed no reportable targetable mutation #Genetic testing-[employer] diagnostic testing showed no pathological variants identified. MS stable, TMB 0mut/mb, KRAS G12D, SF3B1 K700E, TP53 V271fs*30  #01/14/2021  CT done at [facility] during the interval, stable disease.  # Her case was presented by Dr.Jia from [facility] tumor board. Cassandra [name] were 4~5 liver lesions on OSH MRI last year at the time of diagnosis highly suspicious for metastasis. She is not eligible for surgical protocol for metastasis resection given the number of liver metastasis is >3. However given her excellent and durable response to chemo, surgical resection may be considered pending sustained disease control at 1 year and may require partial hepatectomy first to see if there is viable tumor before proceeding to Whipple resection ]  Patient had a COVID-19 infection in September 2022.  03/23/2021 CT abdomen pelvis No significant change in the ill-defined pancreatic head mass or  associated biliary ductal and pancreatic ductal dilation.  Slight interval enlargement of subcentimeter anterior peripancreatic  lymph node, nonspecific. Attention on follow-up per clinical protocol. Similar enlargement of the ascending thoracic aorta measuring 4.4 cm  03/23/2021 MRI abdomen w and wo  Increasing ill-defined hypoenhancement  of the pancreatic head surrounding the common bile duct stent, in keeping with known pancreatic malignancy. Anterior peripancreatic lymph node is better evaluated on CT.  Unchanged position of a common bile duct stent with similar mild diffuse intrahepatic ductal dilation and pneumobilia. No evidence of metastatic disease in the abdomen or pelvis. Partially visualized cystic left adnexal  lesion measuring up to 3.9 cm.   04/10/2021, patient underwent liver resection at [facility], by Dr. Zani.   Pathology report from [facility] was reviewed. Segments 3 partial hepatectomy, negative for viable tumor. Section 5/6 partial hepatectomy part 1 and 2, microscopic foci of residual viable adenocarcinoma, morphologically consistent with history of pancreatic primary.  Parenchymal margin is uninvolved.  #Patient resumed on FOLFIRI on 04/28/2021. She got another cycle on 05/12/2021 #05/26/2021, patient did not get additional chemotherapy due to transaminitis.  Shared decision was made to stop FOLFIRI and switch to gemcitabine  Abraxane  treatments.  05/26/2021, patient developed transaminitis, AST 763, ALT 691, alkaline phosphatase 364.  Bilirubin 0.7 05/26/2021 stat ultrasound abdomen right upper quadrant showed interval development of 3.3 x 1.8 cm complex mass in the right hepatic lobe.  Increased extrahepatic ductal dilatation.  Concerning for CBD obstruction. 05/29/2021, MRI abdomen MRCP with and without contrast showed unchanged pancreatic head soft tissue.  Severe intra and extrahepatic biliary ductal dilatation.  Common bile duct stents remain in position however patency is not established.  No evidence of lymphadenopathy or metastatic disease in the abdomen.  With further communication with radiologist, addendum was added that there is internal fluid signal and no appreciable associated contrast enhancement measuring 3.4 x 2.3 cm.  This appearance is generally not consistent with metastasis and is of uncertain significance.  Possibly reflecting hepatic abscess or residual of subcapsular hematoma.  Patient was seen by [facility] Dr. Oneal team.  He had ERCP done on 06/05/2021, with findings of One stent from the biliary tree was seen in the major papilla. The stent had migrated significantly into the duodenum. This is the cause of stent malfunction. One stent was removed from the biliary tree. Prior biliary  sphincterotomy appeared open. - A single severe biliary stricture was found in the lower third of the main bile duct with upstream dilation. The stricture was alignant appearing.- The biliary tree was swept and sludge was found.- One fully covered metal stent was placed into the common bile duct across the stricture.- No pancreatogram performed.  06/12/2021-08/10/2021 gemcitabine  and Abraxane .   10/14/2021, patient underwent Whipple procedure. Liver biopsy negative for malignancy. Review procedure showed invasive adenocarcinoma, moderately differentiated, centered in pancreatic head and confined in the Pancreas. Surgical margin is negative for malignancy.  36 lymph nodes were all negative for malignancy.  1 hepatic artery lymph node was harvested and was negative.  Gallbladder negative for malignancy.  Cystic duct excision negative for malignancy. pT1b pN0  10/25/2021, CT abdomen pelvis with contrast showed rim-enhancing fluid collection in the region of the hepatic hilum. Patient had a readmission for superficial wound infection.  She completed antibiotics on 11/03/2021.  11/03/2021, CT abdomen pelvis with contrast showed near resolution of previously seen fluid collection in the region of the hepatic hilum.  Similar appearance of the area of hypoenhancement in the liver.  Attention on follow-up.  11/17/2021, resumed on gemcitabine /Abraxane . 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  12/12/ 2023 CT scan at [facility] shows no evidence of cancer recurrence.-NED 09/21/2022 CT chest abdomen pelvis w contrast at [facility] showed-No evidence of metastatic disease in the chest, abdomen, or pelvis. 12/21/2022  CT chest abdomen pelvis w contrast at [facility] showed-No evidence of metastatic disease in the chest, abdomen, or pelvis.   03/22/2023 CT scan at [facility] showed enlarging lung nodules. RLL 1cm, LLL 0.5cm, RUL 0.5cm Stable hypoattenuating right inferior hepatic lobe lesion measuring 0.9 cm.  Similar left adnexal  cystic lesion measuring up to 5.0 cm.   04/06/2023 s/p lung nodule biopsy.  Rare atypical epithelioid cells present with evidence suggestive of invasion, suspicious but not diagnostic for malignancy.   05/20/2023: Right robot assisted thoracoscopic wedge resection with MLND (Dr. Murlean)   Pathology:  Suspicious for malignancy but not diagnostic A. Lymph node, level 8, biopsy: Metastatic carcinoma involving one lymph node (1/1).  B. Lung, right lower lobe, wedge resection: Lung tissue positive for adenocarcinoma, consistent with metastasis from pancreas primary. See comment. Tumor size: 1.0 cm. Operative margin: Negative  Comment: Note is made of the patient's history of invasive adenocarcinoma of the pancreatic head ([id number]; 10/14/2021), which is compared to the current case. Comparison is challenging given the post-treatment nature of the pancreatic tumor; however, there are some morphologic similarities.  Although often unhelpful in the setting of lung primary versus pancreatic primary, immunohistochemical stains were performed. Tumor cells demonstrate the below immunoprofile which would could support pulmonary adenocarcinoma with enteric differentiation but would also be consistent with metastasis from the patient's known pancreatic primary; the latter is favored.              Positive                             Negative CK7 (strong, diffuse) TTF-1  CK20 (strong, focal)                    Positive and negative controls perform appropriately.    C. Lymph node, level 7, biopsy: Metastatic carcinoma involving one lymph node (1/1).  D. Lymph node, level 4R, biopsy: One lymph node, negative for malignancy (0/1).   04/03/2024, CT chest abdomen pelvis with contrast showed Enlargement of soft tissue nodule adjacent to the right aspect of the superior mesenteric artery.  Multiple small bilateral pulmonary nodules, slightly enlarged.   + chronic neuropathy /numbness on her toes, worse at  night, better after putting on her shoes.  She gets acupuncture session which helped her symptoms.  INTERVAL HISTORY Cassandra [name] is a 69 y.o. female who has above history reviewed by me today presents for stage IV pancreatic cancer.   Overall she tolerated with FOLFIRI Postchemotherapy nausea is better controlled with Phenergan  suppository as needed. She takes calcium  supplementation as instructed.  She take Creon  for pancreatic insufficiency  She denies abdominal pain diarrhea.   07/11/2023 CT chest abdomen pelvis with contrast showed 1. Continued slight enlargement of multiple irregular pulmonary nodules. Findings are consistent with worsened pulmonary metastatic disease. 2. Slight interval enlargement of soft tissue nodule adjacent to the right aspect of the superior mesenteric artery, measuring 2.1 x 2.0 cm, previously 1.9 x 1.6 cm. This is consistent with worsened locally recurrent disease. 3. Status post Whipple pancreaticoduodenectomy. 4. 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   Review of Systems  Constitutional:  Positive for fatigue. Negative for appetite change, chills, fever and unexpected weight change.  HENT:   Negative for hearing loss and voice change.   Eyes:  Negative for eye problems.  Respiratory:  Negative for chest tightness and cough.  Cardiovascular:  Negative for chest pain.  Gastrointestinal:  Positive for nausea. Negative for abdominal distention, abdominal pain and blood in stool.  Endocrine: Negative for hot flashes.  Genitourinary:  Negative for difficulty urinating and frequency.   Musculoskeletal:  Positive for arthralgias.  Skin:  Negative for itching and rash.  Neurological:  Positive for numbness. Negative for extremity weakness.  Hematological:  Negative for adenopathy.  Psychiatric/Behavioral:  Negative for confusion.     MEDICAL HISTORY:  Past Medical History:  Diagnosis Date   Allergy     Anemia    Bacteremia due to Streptococcus pneumoniae 03/19/2020   Cancer [facility])    pancreatic cancer   Colon polyps    Family history of breast cancer    Neuropathy due to drug    chemo induced   Neutropenia 04/18/2020   Osteopenia after menopause 05/2017   femoral neck T score -2.0   Personal history of chemotherapy    current for pancreatic ca   PONV (postoperative nausea and vomiting)    Pure hypercholesterolemia    Sepsis (HCC) 03/19/2020    SURGICAL HISTORY: Past Surgical History:  Procedure Laterality Date   COLONOSCOPY  07/2015   WNL   COLONOSCOPY  2008/2011   COLONOSCOPY WITH PROPOFOL  N/A 07/10/2021   Procedure: COLONOSCOPY WITH PROPOFOL ;  Surgeon: Maryruth Ole DASEN, MD;  Location: [facility] ENDOSCOPY;  Service: Endoscopy;  Laterality: N/A;   IR CV LINE INJECTION  04/28/2021   OVARIAN CYST REMOVAL  1992   dermoid-Dr CAK   PORTA CATH INSERTION N/A 03/07/2020   Procedure: PORTA CATH INSERTION;  Surgeon: Marea Selinda RAMAN, MD;  Location: [facility] INVASIVE CV LAB;  Service: Cardiovascular;  Laterality: N/A;   TEE WITHOUT CARDIOVERSION N/A 03/21/2020   Procedure: TRANSESOPHAGEAL ECHOCARDIOGRAM (TEE);  Surgeon: Fernand Denyse LABOR, MD;  Location: [facility] ORS;  Service: Cardiovascular;  Laterality: N/A;   TUBAL LIGATION  1993    SOCIAL HISTORY: Social History   Socioeconomic History   Marital status: Married    Spouse name: Not on file   Number of children: 2   Years of education: Not on file   Highest education level: Bachelor's degree (e.g., BA, AB, BS)  Occupational History   Occupation: Runner, Broadcasting/film/video    Comment: retired   Occupation: Visual Merchandiser  Tobacco Use   Smoking status: Former    Current packs/day: 0.00    Average packs/day: 1 pack/day for 10.0 years (10.0 ttl pk-yrs)    Types: Cigarettes    Start date: 06/29/1975    Quit date: 06/28/1985    Years since quitting: 39.0   Smokeless tobacco: Never   Tobacco comments:    Quit smoking 1987  Vaping Use   Vaping status: Never Used   Substance and Sexual Activity   Alcohol use: Yes    Alcohol/week: 5.0 standard drinks of alcohol    Types: 5 Glasses of wine per week    Comment: 0-2 mixed drinks a day   Drug use: No   Sexual activity: Not Currently    Birth control/protection: Post-menopausal  Other Topics Concern   Not on file  Social History Narrative   Not on file   Social Drivers of Health   Tobacco Use: Medium Risk (07/16/2024)   Patient History    Smoking Tobacco Use: Former    Smokeless Tobacco Use: Never    Passive Exposure: Not on file  Financial Resource Strain: Low Risk  (04/07/2024)   Received from [facility]   Overall Financial Resource Strain (CARDIA)  Difficulty of Paying Living Expenses: Not hard at all  Food Insecurity: No Food Insecurity (04/07/2024)   Received from [facility]   Epic    Within the past 12 months, you worried that your food would run out before you got the money to buy more.: Never true    Within the past 12 months, the food you bought just didn't last and you didn't have money to get more.: Never true  Transportation Needs: No Transportation Needs (04/07/2024)   Received from [facility] - Transportation    In the past 12 months, has lack of transportation kept you from medical appointments or from getting medications?: No    Lack of Transportation (Non-Medical): No  Physical Activity: Sufficiently Active (10/18/2023)   Exercise Vital Sign    Days of Exercise per Week: 6 days    Minutes of Exercise per Session: 30 min  Stress: No Stress Concern Present (10/18/2023)   [employer] of Occupational Health - Occupational Stress Questionnaire    Feeling of Stress : Not at all  Social Connections: Moderately Integrated (10/18/2023)   Social Connection and Isolation Panel    Frequency of Communication with Friends and Family: More than three times a week    Frequency of Social Gatherings with Friends and  Family: More than three times a week    Attends Religious Services: More than 4 times per year    Active Member of [employer] or Organizations: No    Attends Banker Meetings: Never    Marital Status: Married  Catering Manager Violence: Not At Risk (10/18/2023)   Humiliation, Afraid, Rape, and Kick questionnaire    Fear of Current or Ex-Partner: No    Emotionally Abused: No    Physically Abused: No    Sexually Abused: No  Depression (PHQ2-9): Low Risk (07/16/2024)   Depression (PHQ2-9)    PHQ-2 Score: 0  Alcohol Screen: Low Risk (10/18/2023)   Alcohol Screen    Last Alcohol Screening Score (AUDIT): 4  Housing: Low Risk  (04/07/2024)   Received from [facility]   Epic    In the last 12 months, was there a time when you were not able to pay the mortgage or rent on time?: No    In the past 12 months, how many times have you moved where you were living?: 0    At any time in the past 12 months, were you homeless or living in a shelter (including now)?: No  Utilities: Not At Risk (04/07/2024)   Received from [facility]   Epic    In the past 12 months has the electric, gas, oil, or water company threatened to shut off services in your home?: No  Health Literacy: Adequate Health Literacy (10/18/2023)   B1300 Health Literacy    Frequency of need for help with medical instructions: Never    FAMILY HISTORY: Family History  Problem Relation Age of Onset   Breast cancer Paternal Aunt 42   Diabetes Mother    Osteoporosis Mother    Hyperlipidemia Father    Rheumatic fever Father    Valvular heart disease Father    Cancer Paternal Grandmother        possible colon   Breast cancer Sister 85    ALLERGIES:  is allergic to irinotecan  and penicillin g.  MEDICATIONS:  Current Outpatient Medications  Medication Sig Dispense Refill   b complex vitamins capsule Take 1 capsule by  mouth daily.     Calcium  Carbonate-Vit D-Min (CALCIUM  1200 PO) Take by  mouth.     Cholecalciferol  25 MCG (1000 UT) tablet Take 2,000 Units by mouth daily.     clobetasol cream (TEMOVATE) 0.05 %      fluticasone (FLONASE) 50 MCG/ACT nasal spray Place 1 spray into both nostrils daily.     lidocaine -prilocaine  (EMLA ) cream Apply small amount to port and cover with saran wrap 1-2 hours prior to port access 30 g 6   loperamide  (IMODIUM ) 2 MG capsule Take 2 tabs by mouth with first loose stool, then 1 tab with each additional loose stool as needed. Do not exceed 8 tabs in a 24-hour period 60 capsule 3   loratadine (CLARITIN REDITABS) 10 MG dissolvable tablet Take 10 mg by mouth daily.     LORazepam  (ATIVAN ) 0.5 MG tablet Take 1 tablet (0.5 mg total) by mouth every 8 (eight) hours as needed for anxiety or sleep (nausea vomiting). 30 tablet 0   Melatonin 2.5 MG CHEW Chew by mouth.     Multiple Vitamins-Minerals (MULTIVITAMIN WITH MINERALS) tablet Take 1 tablet by mouth daily.     ondansetron  (ZOFRAN ) 8 MG tablet Take 1 tablet (8 mg total) by mouth every 8 (eight) hours as needed for nausea, vomiting or refractory nausea / vomiting. Start on the third day after chemotherapy. 30 tablet 1   Pancrelipase , Lip-Prot-Amyl, (CREON ) 24000-76000 units CPEP TAKE 1 CAPSULE THREE TIMES DAILY BEFORE MEALS 200 capsule 3   promethazine  (PHENERGAN ) 25 MG suppository Place 1 suppository (25 mg total) rectally every 6 (six) hours as needed for nausea or vomiting. 30 each 0   senna-docusate (SENOKOT-S) 8.6-50 MG tablet Take by mouth.     simethicone  (GAS-X) 80 MG chewable tablet Chew 1 tablet (80 mg total) by mouth every 8 (eight) hours as needed for flatulence. 60 tablet 0   No current facility-administered medications for this visit.   Facility-Administered Medications Ordered in Other Visits  Medication Dose Route Frequency Provider Last Rate Last Admin   0.9 %  sodium chloride  infusion   Intravenous Continuous Babara Call, MD 10 mL/hr at 07/16/24 0941 New Bag at 07/16/24 0941   fluorouracil   (ADRUCIL ) 4,100 mg in sodium chloride  0.9 % 68 mL chemo infusion  2,400 mg/m2 (Order-Specific) Intravenous 1 day or 1 dose Babara Call, MD       heparin  lock flush 100 UNIT/ML injection            irinotecan  (CAMPTOSAR ) 200 mg in sodium chloride  0.9 % 500 mL chemo infusion  125 mg/m2 (Order-Specific) Intravenous Once Future Yeldell, MD 170 mL/hr at 07/16/24 1051 200 mg at 07/16/24 1051   leucovorin  680 mg in sodium chloride  0.9 % 250 mL infusion  400 mg/m2 (Order-Specific) Intravenous Once Babara Call, MD 95 mL/hr at 07/16/24 1049 680 mg at 07/16/24 1049   prochlorperazine  (COMPAZINE ) 10 MG tablet            sodium chloride  flush (NS) 0.9 % injection 10 mL  10 mL Intravenous PRN Babara Call, MD   10 mL at 02/18/21 0858   sodium chloride  flush (NS) 0.9 % injection 10 mL  10 mL Intravenous Once Borders, Joshua R, NP       sodium chloride  flush (NS) 0.9 % injection 10 mL  10 mL Intravenous Once Babara Call, MD         PHYSICAL EXAMINATION: ECOG PERFORMANCE STATUS: 0 - Asymptomatic Vitals:   07/16/24 0840 07/16/24 0852  BP: ROLLEN)  142/87 136/80  Pulse: 63   Resp: 20   Temp: 98.6 F (37 C)   SpO2: 100%    Filed Weights   07/16/24 0840  Weight: 131 lb 8 oz (59.6 kg)     Physical Exam Constitutional:      General: She is not in acute distress. HENT:     Head: Normocephalic and atraumatic.  Eyes:     General: No scleral icterus. Cardiovascular:     Rate and Rhythm: Normal rate and regular rhythm.     Heart sounds: Normal heart sounds.  Pulmonary:     Effort: Pulmonary effort is normal. No respiratory distress.  Abdominal:     General: Bowel sounds are normal. There is no distension.     Palpations: Abdomen is soft.  Musculoskeletal:        General: No deformity. Normal range of motion.     Cervical back: Normal range of motion and neck supple.  Skin:    General: Skin is warm.     Coloration: Skin is not jaundiced.     Findings: No erythema.  Neurological:     Mental Status: She is alert and  oriented to person, place, and time. Mental status is at baseline.  Psychiatric:        Mood and Affect: Mood normal.     LABORATORY DATA:  I have reviewed the data as listed    Latest Ref Rng & Units 07/16/2024    8:21 AM 07/02/2024    8:53 AM 06/13/2024    8:52 AM  CBC  WBC 4.0 - 10.5 K/uL 3.5  3.4  4.1   Hemoglobin 12.0 - 15.0 g/dL 87.8  88.0  88.0   Hematocrit 36.0 - 46.0 % 35.4  34.9  35.4   Platelets 150 - 400 K/uL 186  198  189       Latest Ref Rng & Units 07/16/2024    8:21 AM 07/02/2024    8:53 AM 06/13/2024    8:52 AM  CMP  Glucose 70 - 99 mg/dL 888  70  863   BUN 8 - 23 mg/dL 13  10  15    Creatinine 0.44 - 1.00 mg/dL 9.16  9.27  9.23   Sodium 135 - 145 mmol/L 141  138  141   Potassium 3.5 - 5.1 mmol/L 3.7  4.0  3.7   Chloride 98 - 111 mmol/L 106  106  107   CO2 22 - 32 mmol/L 24  23  22    Calcium  8.9 - 10.3 mg/dL 9.2  9.2  9.1   Total Protein 6.5 - 8.1 g/dL 6.6  6.4  6.3   Total Bilirubin 0.0 - 1.2 mg/dL 0.5  0.3  0.5   Alkaline Phos 38 - 126 U/L 95  97  87   AST 15 - 41 U/L 31  28  29    ALT 0 - 44 U/L 35  30  28      RADIOGRAPHIC STUDIES: I have personally reviewed the radiological images as listed and agreed with the findings in the report. Reviewed findings of MRI abdomen MRCP done at [facility]. CT CHEST ABDOMEN PELVIS W CONTRAST Result Date: 07/13/2024 CLINICAL DATA:  Pancreatic cancer restaging * Tracking Code: BO * EXAM: CT CHEST, ABDOMEN, AND PELVIS WITH CONTRAST TECHNIQUE: Multidetector CT imaging of the chest, abdomen and pelvis was performed following the standard protocol during bolus administration of intravenous contrast. RADIATION DOSE REDUCTION: This exam was performed according to the departmental dose-optimization program  which includes automated exposure control, adjustment of the mA and/or kV according to patient size and/or use of iterative reconstruction technique. 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 Scattered aortic atherosclerosis. Normal heart size. No pericardial effusion. Mediastinum/Nodes: No enlarged mediastinal, hilar, or axillary lymph nodes. Thyroid gland, trachea, and esophagus demonstrate no significant findings. Lungs/Pleura: Wedge resection of the dependent right lower lobe. Continued slight enlargement of multiple irregular nodules, most of these subpleural in the dependent left lower lobe, largest nodule in the dependent left lower lobe measuring 1.3 x 0.9 cm, previously 0.8 cm (series 4, image 74). No pleural effusion or pneumothorax. Musculoskeletal: No chest wall abnormality. No acute osseous findings. CT ABDOMEN PELVIS FINDINGS Hepatobiliary: No solid liver abnormality is seen. Cholecystectomy and hepaticojejunostomy. No biliary ductal dilatation Pancreas: Whipple pancreaticoduodenectomy with atrophy of the pancreatic remnant. No pancreatic ductal dilatation or surrounding inflammatory changes. Slight interval enlargement of soft tissue nodule adjacent to the right aspect of the superior mesenteric artery, measuring 2.1 x 2.0 cm, previously 1.9 x 1.6 cm (series 2, image 74). Spleen: Normal in size without significant abnormality. Adrenals/Urinary Tract: Adrenal glands are unremarkable. Kidneys are normal, without renal calculi, solid lesion, or hydronephrosis. Bladder is unremarkable. Stomach/Bowel: Distal gastrectomy and gastrojejunostomy. Appendix appears normal. No evidence of bowel wall thickening, distention, or inflammatory changes. Vascular/Lymphatic: Aortic atherosclerosis. No enlarged abdominal or pelvic lymph nodes. Reproductive: Unchanged left ovarian cyst measuring 5.2 x 3.4 cm (series 2, image 101). Other: No abdominal wall hernia or abnormality. No ascites. Musculoskeletal: No acute osseous findings. IMPRESSION: 1. Continued slight enlargement of multiple  irregular pulmonary nodules. Findings are consistent with worsened pulmonary metastatic disease. 2. Slight interval enlargement of soft tissue nodule adjacent to the right aspect of the superior mesenteric artery, measuring 2.1 x 2.0 cm, previously 1.9 x 1.6 cm. This is consistent with worsened locally recurrent disease. 3. Status post Whipple pancreaticoduodenectomy. 4. 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 Aortic Atherosclerosis (ICD10-I70.0). Electronically Signed   By: Marolyn JONETTA Jaksch M.D.   On: 07/13/2024 12:48

## 2024-07-16 NOTE — Assessment & Plan Note (Signed)
 Mostly bothering her toes, grade 2.  She did not tolerate Cymbalta .   Stable symptoms She gets acupuncture sessions which help her symptoms.

## 2024-07-16 NOTE — Assessment & Plan Note (Signed)
 S/p  Whipple procedure..  Continue Creon , continue 24000 units TID with meals and  with her snacks

## 2024-07-16 NOTE — Telephone Encounter (Signed)
 Referral faxed to [facility] oncology to establish care with Dr. Markham. Res; Pancreatic cancer

## 2024-07-16 NOTE — Assessment & Plan Note (Signed)
 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 Continue calcium  supplementation, 2000 mg daily-divided in 2-3 doses IV calcium  gluconate 2g if calcium  <=8.5 Calcium  level has been stable

## 2024-07-16 NOTE — Assessment & Plan Note (Addendum)
 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  Pathology proved liver metastatic disease- additional chemotherapy --> whipple procedure.ypT1b ypN0--> adjuvant Gem/Abraxane  finished in August 2023--> Sept 2024 CT showed progression in lung --> Nov 2024 Wedge biopsy- Recurrent pancreatic cancer with lung metastasis--> --> Gem Abraxane  -->12/2023 CT stable disease--> 04/03/2024 CT - disease progression -soft tissue close to SMA increased size.  Slightly enlarged lung nodules.-->  04/25/24 5-FU/irinotecan  Signatera circulating tumor DNA testing- negative.  Currently on 5-FU and Irinotecan . -Irinotecan  dose reduced to 125 mg/m2,  infusion time-180 minutes. Labs are reviewed and discussed with patient. CEA fluctuates, CA 19.9 increased. Dec 2025 Signatera cDNA positive.  Jan 2025 CT chest abdomen pelvis w contrast showed slight enlargement of superior mesenteric artery soft tissue nodule and multiple lung nodules, consistent with disease progression.  Proceed with cycle 6 5-FU and Irinotecan .  Recommend patient to reestablish care with [facility] oncology for evaluation of feasibility of clinical trials.

## 2024-07-16 NOTE — Assessment & Plan Note (Signed)
 Recommend home antiemetics PRN. She has Zofran  and Compazine . Compazine  is not effective. Zofran  can not be taken within first 3 days.  Add aprepitant  to her premed today Phenergan  25mg   suppository as needed has been helpful..  Lorazepem 0.5mg  Q8h PRN for nausea, anxiety, sleep

## 2024-07-16 NOTE — Assessment & Plan Note (Signed)
 Chemotherapy plan as listed above

## 2024-07-17 LAB — CEA: CEA: 7 ng/mL — ABNORMAL HIGH (ref 0.0–4.7)

## 2024-07-17 LAB — CANCER ANTIGEN 19-9: CA 19-9: 43 U/mL — ABNORMAL HIGH (ref 0–35)

## 2024-07-18 ENCOUNTER — Inpatient Hospital Stay

## 2024-07-18 VITALS — BP 112/76 | HR 61 | Temp 97.2°F | Resp 18

## 2024-07-18 DIAGNOSIS — C259 Malignant neoplasm of pancreas, unspecified: Secondary | ICD-10-CM

## 2024-07-25 ENCOUNTER — Encounter: Payer: Self-pay | Admitting: Oncology

## 2024-07-25 ENCOUNTER — Other Ambulatory Visit: Payer: Self-pay

## 2024-07-25 MED ORDER — PROMETHAZINE HCL 25 MG RE SUPP
25.0000 mg | Freq: Four times a day (QID) | RECTAL | 0 refills | Status: AC | PRN
  Filled 2024-07-25: qty 30, 8d supply, fill #0

## 2024-07-26 ENCOUNTER — Telehealth: Payer: Self-pay | Admitting: Oncology

## 2024-07-26 ENCOUNTER — Other Ambulatory Visit: Payer: Self-pay

## 2024-07-26 ENCOUNTER — Encounter: Payer: Self-pay | Admitting: Oncology

## 2024-07-26 NOTE — Telephone Encounter (Signed)
 Patient called wanting to get her treatment rescheduled to Tuesday or Wednesday next week please advise

## 2024-07-27 ENCOUNTER — Other Ambulatory Visit: Payer: Self-pay | Admitting: Oncology

## 2024-07-29 ENCOUNTER — Telehealth: Payer: Self-pay | Admitting: Oncology

## 2024-07-29 NOTE — Telephone Encounter (Signed)
 Due to weather, clinic on delayed start time on 2/3. I called and spoke with pt to confirm new start time

## 2024-07-30 ENCOUNTER — Inpatient Hospital Stay

## 2024-07-30 ENCOUNTER — Inpatient Hospital Stay: Admitting: Oncology

## 2024-07-31 ENCOUNTER — Encounter: Payer: Self-pay | Admitting: Oncology

## 2024-07-31 ENCOUNTER — Inpatient Hospital Stay: Attending: Oncology | Admitting: Oncology

## 2024-07-31 ENCOUNTER — Inpatient Hospital Stay

## 2024-07-31 ENCOUNTER — Inpatient Hospital Stay: Admitting: Oncology

## 2024-07-31 ENCOUNTER — Inpatient Hospital Stay: Attending: Oncology

## 2024-07-31 VITALS — BP 126/74 | HR 59 | Temp 98.8°F | Resp 24 | Wt 132.7 lb

## 2024-07-31 VITALS — Resp 20

## 2024-07-31 DIAGNOSIS — K8689 Other specified diseases of pancreas: Secondary | ICD-10-CM

## 2024-07-31 DIAGNOSIS — T451X5A Adverse effect of antineoplastic and immunosuppressive drugs, initial encounter: Secondary | ICD-10-CM

## 2024-07-31 DIAGNOSIS — C259 Malignant neoplasm of pancreas, unspecified: Secondary | ICD-10-CM | POA: Diagnosis not present

## 2024-07-31 DIAGNOSIS — G620 Drug-induced polyneuropathy: Secondary | ICD-10-CM | POA: Diagnosis not present

## 2024-07-31 DIAGNOSIS — R110 Nausea: Secondary | ICD-10-CM

## 2024-07-31 DIAGNOSIS — Z5111 Encounter for antineoplastic chemotherapy: Secondary | ICD-10-CM

## 2024-07-31 LAB — CBC WITH DIFFERENTIAL (CANCER CENTER ONLY)
Abs Immature Granulocytes: 0.01 10*3/uL (ref 0.00–0.07)
Basophils Absolute: 0 10*3/uL (ref 0.0–0.1)
Basophils Relative: 1 %
Eosinophils Absolute: 0.1 10*3/uL (ref 0.0–0.5)
Eosinophils Relative: 1 %
HCT: 34.7 % — ABNORMAL LOW (ref 36.0–46.0)
Hemoglobin: 11.7 g/dL — ABNORMAL LOW (ref 12.0–15.0)
Immature Granulocytes: 0 %
Lymphocytes Relative: 26 %
Lymphs Abs: 1.1 10*3/uL (ref 0.7–4.0)
MCH: 32.3 pg (ref 26.0–34.0)
MCHC: 33.7 g/dL (ref 30.0–36.0)
MCV: 95.9 fL (ref 80.0–100.0)
Monocytes Absolute: 0.4 10*3/uL (ref 0.1–1.0)
Monocytes Relative: 9 %
Neutro Abs: 2.6 10*3/uL (ref 1.7–7.7)
Neutrophils Relative %: 63 %
Platelet Count: 179 10*3/uL (ref 150–400)
RBC: 3.62 MIL/uL — ABNORMAL LOW (ref 3.87–5.11)
RDW: 14.6 % (ref 11.5–15.5)
WBC Count: 4.1 10*3/uL (ref 4.0–10.5)
nRBC: 0 % (ref 0.0–0.2)

## 2024-07-31 LAB — CMP (CANCER CENTER ONLY)
ALT: 24 U/L (ref 0–44)
AST: 28 U/L (ref 15–41)
Albumin: 4.2 g/dL (ref 3.5–5.0)
Alkaline Phosphatase: 95 U/L (ref 38–126)
Anion gap: 12 (ref 5–15)
BUN: 15 mg/dL (ref 8–23)
CO2: 22 mmol/L (ref 22–32)
Calcium: 9 mg/dL (ref 8.9–10.3)
Chloride: 104 mmol/L (ref 98–111)
Creatinine: 0.79 mg/dL (ref 0.44–1.00)
GFR, Estimated: >60 mL/min
Glucose, Bld: 116 mg/dL — ABNORMAL HIGH (ref 70–99)
Potassium: 3.8 mmol/L (ref 3.5–5.1)
Sodium: 138 mmol/L (ref 135–145)
Total Bilirubin: 0.5 mg/dL (ref 0.0–1.2)
Total Protein: 6.3 g/dL — ABNORMAL LOW (ref 6.5–8.1)

## 2024-07-31 MED ORDER — SODIUM CHLORIDE 0.9 % IV SOLN
400.0000 mg/m2 | Freq: Once | INTRAVENOUS | Status: AC
  Administered 2024-07-31: 680 mg via INTRAVENOUS
  Filled 2024-07-31: qty 25

## 2024-07-31 MED ORDER — SODIUM CHLORIDE 0.9 % IV SOLN
INTRAVENOUS | Status: DC
  Filled 2024-07-31: qty 250

## 2024-07-31 MED ORDER — SODIUM CHLORIDE 0.9 % IV SOLN
2400.0000 mg/m2 | INTRAVENOUS | Status: DC
  Administered 2024-07-31: 4100 mg via INTRAVENOUS
  Filled 2024-07-31: qty 82

## 2024-07-31 MED ORDER — PROCHLORPERAZINE EDISYLATE 10 MG/2ML IJ SOLN
10.0000 mg | Freq: Once | INTRAMUSCULAR | Status: AC
  Administered 2024-07-31: 10 mg via INTRAVENOUS
  Filled 2024-07-31: qty 2

## 2024-07-31 NOTE — Assessment & Plan Note (Signed)
 S/p  Whipple procedure..  Continue Creon , continue 24000 units TID with meals and  with her snacks

## 2024-07-31 NOTE — Assessment & Plan Note (Signed)
 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  Pathology proved liver metastatic disease- additional chemotherapy --> whipple procedure.ypT1b ypN0--> adjuvant Gem/Abraxane  finished in August 2023--> Sept 2024 CT showed progression in lung --> Nov 2024 Wedge biopsy- Recurrent pancreatic cancer with lung metastasis--> --> Gem Abraxane  -->12/2023 CT stable disease--> 04/03/2024 CT - disease progression -soft tissue close to SMA increased size.  Slightly enlarged lung nodules.-->  04/25/24 5-FU/irinotecan  Signatera circulating tumor DNA testing- negative.  Currently on 5-FU and Irinotecan . -Irinotecan  dose reduced to 125 mg/m2,  infusion time-180 minutes. Labs are reviewed and discussed with patient. CEA fluctuates, CA 19.9 increased. Dec 2025 Signatera cDNA positive.  Jan 2025 CT chest abdomen pelvis w contrast showed slight enlargement of superior mesenteric artery soft tissue nodule and multiple lung nodules, consistent with disease progression.  Recommend patient to reestablish care with [facility] oncology for evaluation of feasibility of clinical trials. Proceed with cycle 7 5-FU to bridge to her second opinion at [facility], hold off Irinotecan .

## 2024-07-31 NOTE — Assessment & Plan Note (Signed)
 Recommend home antiemetics PRN. She has Zofran  and Compazine . Compazine  is not effective. Zofran  can not be taken within first 3 days.  Phenergan  25mg   suppository as needed has been helpful..  Lorazepem 0.5mg  Q8h PRN for nausea, anxiety, sleep

## 2024-07-31 NOTE — Patient Instructions (Signed)
 Device With a Small Disc Placed for Long-Term IV Use (Implanted Port): Care at Home An implanted port is a device with a small disc that's put under your skin. In most cases, it's placed in your chest. It can be used to draw blood and send medicines and fluids quickly into your bloodstream. You may need a port to give you: IV medicines that would bother the small veins in your hands or arms. IV medicines for a long time, such as medicines to kill cancer cells (chemotherapy). IV liquid nutrition for a long time. An implanted port has 2 main parts: The portal. This is the round disc where the needle is put in. It may look like a small, raised area under your skin. The catheter. This is a soft tube that connects the port to a vein. You may be able to do more everyday tasks with a port than with other types of long-term IVs. How is my port accessed?  A numbing cream may be put on the skin over the port site. Your skin will be cleaned with a solution that kills germs. Your health care provider will gently pinch the port and put a needle in it. The port will be checked to make sure it's in the vein and working. If you need to get medicine all the time, the needle may be left in the port. Your provider will put a clear bandage over the needle site. The bandage and needle will need to be changed each week. What is flushing? Flushing helps keep the port working like it should. Flush your port as told. If your port is only used from time to time to give medicines or draw blood, you may need to flush it: Before and after medicines have been given. Before and after blood has been drawn. Every 4-6 weeks to keep it working well. If you get medicine through your port all the time, you may not need to flush it. Talk with your provider to learn more. How long will my port stay in? The port will stay in for as long as it's needed. When it's time for it to come out, you'll have a procedure to remove it. Follow  these instructions at home: Caring for your port and port site Flush your port as told. If you need to get an infusion of medicine over a few days, take care of your port as told. Make sure you: Take care of your port site. Wash your hands with soap and water  for at least 20 seconds before and after you change your bandage. If you can't use soap and water , use hand sanitizer. Put any used bandages or infusion bags in a plastic bag. Throw the bag in the trash. Keep the bandage that covers the needle clean and dry. Do not let it get wet. Do not use scissors or sharp objects near the tubes. Keep any tubes clamped, unless they're being used. Check the area around your port site every day for signs of infection. Check for: Redness, swelling, or pain. Fluid or blood. Warmth. Pus or a bad smell. Protect the skin around the port site. Avoid wearing bra straps that rub or irritate the site. Protect the skin around your port from seat belts. Place a soft pad over your chest if needed. Do not take baths, swim, or use a hot tub until you're told it's OK. Ask if you can shower. General instructions  Throw away any syringes in a container that's meant for sharp  items (sharps container). You can buy a sharps container from a pharmacy. You can also make one by using an empty, hard plastic bottle with a lid. Always carry a medical alert card or wear a medical alert bracelet. Ask what things are safe for you to do at home. Ask when you can go back to work or school. Where to find more information To learn more, go to: American [employer] Society at prombar.it. Click on the magnifying glass and type IV lines and ports. Find the link you need. American Society of Clinical Oncology at fabvets.de. Click on the magnifying glass and type getting chemo infusions. Find the link you need. Contact a health care provider if: You can't flush your port. You can't draw blood from the port. You have a fever or  chills. You have any signs of infection. You have swelling in your arm, neck, or shoulder. This information is not intended to replace advice given to you by your health care provider. Make sure you discuss any questions you have with your health care provider. Document Revised: 03/27/2024 Document Reviewed: 03/27/2024 Elsevier Patient Education  2025 [employer].

## 2024-07-31 NOTE — Patient Instructions (Signed)
 [facility] - A DEPT OF [place]  Discharge Instructions: Thank you for choosing [facility] to provide your oncology and hematology care.  If you have a lab appointment with the Cancer Center, please go directly to the Cancer Center and check in at the registration area.  Wear comfortable clothing and clothing appropriate for easy access to any Portacath or PICC line.   We strive to give you quality time with your provider. You may need to reschedule your appointment if you arrive late (15 or more minutes).  Arriving late affects you and other patients whose appointments are after yours.  Also, if you miss three or more appointments without notifying the office, you may be dismissed from the clinic at the providers discretion.      For prescription refill requests, have your pharmacy contact our office and allow 72 hours for refills to be completed.    Today you received the following chemotherapy and/or immunotherapy agents LEUCOVORIN  and 5 FU      To help prevent nausea and vomiting after your treatment, we encourage you to take your nausea medication as directed.  BELOW ARE SYMPTOMS THAT SHOULD BE REPORTED IMMEDIATELY: *FEVER GREATER THAN 100.4 F (38 C) OR HIGHER *CHILLS OR SWEATING *NAUSEA AND VOMITING THAT IS NOT CONTROLLED WITH YOUR NAUSEA MEDICATION *UNUSUAL SHORTNESS OF BREATH *UNUSUAL BRUISING OR BLEEDING *URINARY PROBLEMS (pain or burning when urinating, or frequent urination) *BOWEL PROBLEMS (unusual diarrhea, constipation, pain near the anus) TENDERNESS IN MOUTH AND THROAT WITH OR WITHOUT PRESENCE OF ULCERS (sore throat, sores in mouth, or a toothache) UNUSUAL RASH, SWELLING OR PAIN  UNUSUAL VAGINAL DISCHARGE OR ITCHING   Items with * indicate a potential emergency and should be followed up as soon as possible or go to the Emergency Department if any problems should occur.  Please show the CHEMOTHERAPY ALERT CARD or  IMMUNOTHERAPY ALERT CARD at check-in to the Emergency Department and triage nurse.  Should you have questions after your visit or need to cancel or reschedule your appointment, please contact [facility] - A DEPT OF [facility]  [phone] and follow the prompts.  Office hours are 8:00 a.m. to 4:30 p.m. Monday - Friday. Please note that voicemails left after 4:00 p.m. may not be returned until the following business day.  We are closed weekends and major holidays. You have access to a nurse at all times for urgent questions. Please call the main number to the clinic [phone] and follow the prompts.  For any non-urgent questions, you may also contact your provider using MyChart. We now offer e-Visits for anyone 49 and older to request care online for non-urgent symptoms. For details visit [email or web address].   Also download the MyChart app! Go to the app store, search MyChart, open the app, select [facility], and log in with your MyChart username and password.   Leucovorin  Injection What is this medication? LEUCOVORIN  (loo koe VOR in) prevents side effects from certain medications, such as methotrexate. It works by increasing folate levels. This helps protect healthy cells in your body. It may also be used to treat anemia caused by low levels of folate. It can also be used with fluorouracil , a type of chemotherapy, to treat colorectal cancer. It works by increasing the effects of fluorouracil  in the body. This medicine may be used for other purposes; ask your health care provider or pharmacist if you have questions. What  should I tell my care team before I take this medication? They need to know if you have any of these conditions: Anemia from low levels of vitamin B12 in the blood An unusual or allergic reaction to leucovorin , folic acid , other medications, foods, dyes, or preservatives Pregnant or trying to get pregnant Breastfeeding How should I  use this medication? This medication is injected into a vein or a muscle. It is given by your care team in a hospital or clinic setting. Talk to your care team about the use of this medication in children. Special care may be needed. Overdosage: If you think you have taken too much of this medicine contact a poison control center or emergency room at once. NOTE: This medicine is only for you. Do not share this medicine with others. What if I miss a dose? Keep appointments for follow-up doses. It is important not to miss your dose. Call your care team if you are unable to keep an appointment. What may interact with this medication? Capecitabine Fluorouracil  Phenobarbital Phenytoin Primidone Trimethoprim ;sulfamethoxazole This list may not describe all possible interactions. Give your health care provider a list of all the medicines, herbs, non-prescription drugs, or dietary supplements you use. Also tell them if you smoke, drink alcohol, or use illegal drugs. Some items may interact with your medicine. What should I watch for while using this medication? Your condition will be monitored carefully while you are receiving this medication. This medication may increase the side effects of 5-fluorouracil . Tell your care team if you have diarrhea or mouth sores that do not get better or that get worse. What side effects may I notice from receiving this medication? Side effects that you should report to your care team as soon as possible: Allergic reactions--skin rash, itching, hives, swelling of the face, lips, tongue, or throat This list may not describe all possible side effects. Call your doctor for medical advice about side effects. You may report side effects to FDA at 1-800-FDA-1088. Where should I keep my medication? This medication is given in a hospital or clinic. It will not be stored at home. NOTE: This sheet is a summary. It may not cover all possible information. If you have questions  about this medicine, talk to your doctor, pharmacist, or health care provider.  2024 Elsevier/Gold Standard (2021-11-17 00:00:00)  Fluorouracil  Injection What is this medication? FLUOROURACIL  (flure oh YOOR a sil) treats some types of cancer. It works by slowing down the growth of cancer cells. This medicine may be used for other purposes; ask your health care provider or pharmacist if you have questions. COMMON BRAND NAME(S): Adrucil  What should I tell my care team before I take this medication? They need to know if you have any of these conditions: Blood disorders Dihydropyrimidine dehydrogenase (DPD) deficiency Infection, such as chickenpox, cold sores, herpes Kidney disease Liver disease Poor nutrition Recent or ongoing radiation therapy An unusual or allergic reaction to fluorouracil , other medications, foods, dyes, or preservatives If you or your partner are pregnant or trying to get pregnant Breast-feeding How should I use this medication? This medication is injected into a vein. It is administered by your care team in a hospital or clinic setting. Talk to your care team about the use of this medication in children. Special care may be needed. Overdosage: If you think you have taken too much of this medicine contact a poison control center or emergency room at once. NOTE: This medicine is only for you. Do not share this  medicine with others. What if I miss a dose? Keep appointments for follow-up doses. It is important not to miss your dose. Call your care team if you are unable to keep an appointment. What may interact with this medication? Do not take this medication with any of the following: Live virus vaccines This medication may also interact with the following: Medications that treat or prevent blood clots, such as warfarin, enoxaparin , dalteparin This list may not describe all possible interactions. Give your health care provider a list of all the medicines, herbs,  non-prescription drugs, or dietary supplements you use. Also tell them if you smoke, drink alcohol, or use illegal drugs. Some items may interact with your medicine. What should I watch for while using this medication? Your condition will be monitored carefully while you are receiving this medication. This medication may make you feel generally unwell. This is not uncommon as chemotherapy can affect healthy cells as well as cancer cells. Report any side effects. Continue your course of treatment even though you feel ill unless your care team tells you to stop. In some cases, you may be given additional medications to help with side effects. Follow all directions for their use. This medication may increase your risk of getting an infection. Call your care team for advice if you get a fever, chills, sore throat, or other symptoms of a cold or flu. Do not treat yourself. Try to avoid being around people who are sick. This medication may increase your risk to bruise or bleed. Call your care team if you notice any unusual bleeding. Be careful brushing or flossing your teeth or using a toothpick because you may get an infection or bleed more easily. If you have any dental work done, tell your dentist you are receiving this medication. Avoid taking medications that contain aspirin , acetaminophen , ibuprofen, naproxen, or ketoprofen unless instructed by your care team. These medications may hide a fever. Do not treat diarrhea with over the counter products. Contact your care team if you have diarrhea that lasts more than 2 days or if it is severe and watery. This medication can make you more sensitive to the sun. Keep out of the sun. If you cannot avoid being in the sun, wear protective clothing and sunscreen. Do not use sun lamps, tanning beds, or tanning booths. Talk to your care team if you or your partner wish to become pregnant or think you might be pregnant. This medication can cause serious birth defects if  taken during pregnancy and for 3 months after the last dose. A reliable form of contraception is recommended while taking this medication and for 3 months after the last dose. Talk to your care team about effective forms of contraception. Do not father a child while taking this medication and for 3 months after the last dose. Use a condom while having sex during this time period. Do not breastfeed while taking this medication. This medication may cause infertility. Talk to your care team if you are concerned about your fertility. What side effects may I notice from receiving this medication? Side effects that you should report to your care team as soon as possible: Allergic reactions--skin rash, itching, hives, swelling of the face, lips, tongue, or throat Heart attack--pain or tightness in the chest, shoulders, arms, or jaw, nausea, shortness of breath, cold or clammy skin, feeling faint or lightheaded Heart failure--shortness of breath, swelling of the ankles, feet, or hands, sudden weight gain, unusual weakness or fatigue Heart rhythm changes--fast or  irregular heartbeat, dizziness, feeling faint or lightheaded, chest pain, trouble breathing High ammonia level--unusual weakness or fatigue, confusion, loss of appetite, nausea, vomiting, seizures Infection--fever, chills, cough, sore throat, wounds that don't heal, pain or trouble when passing urine, general feeling of discomfort or being unwell Low red blood cell level--unusual weakness or fatigue, dizziness, headache, trouble breathing Pain, tingling, or numbness in the hands or feet, muscle weakness, change in vision, confusion or trouble speaking, loss of balance or coordination, trouble walking, seizures Redness, swelling, and blistering of the skin over hands and feet Severe or prolonged diarrhea Unusual bruising or bleeding Side effects that usually do not require medical attention (report to your care team if they continue or are  bothersome): Dry skin Headache Increased tears Nausea Pain, redness, or swelling with sores inside the mouth or throat Sensitivity to light Vomiting This list may not describe all possible side effects. Call your doctor for medical advice about side effects. You may report side effects to FDA at 1-800-FDA-1088. Where should I keep my medication? This medication is given in a hospital or clinic. It will not be stored at home. NOTE: This sheet is a summary. It may not cover all possible information. If you have questions about this medicine, talk to your doctor, pharmacist, or health care provider.  2024 Elsevier/Gold Standard (2021-10-20 00:00:00)

## 2024-07-31 NOTE — Assessment & Plan Note (Signed)
 Chemotherapy plan as listed above

## 2024-07-31 NOTE — Assessment & Plan Note (Signed)
 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 Continue calcium  supplementation, 2000 mg daily-divided in 2-3 doses IV calcium  gluconate 2g if calcium  <=8.5 Calcium  level has been stable

## 2024-07-31 NOTE — Assessment & Plan Note (Signed)
 Mostly bothering her toes, grade 2.  She did not tolerate Cymbalta .   Stable symptoms She gets acupuncture sessions which help her symptoms.

## 2024-08-01 ENCOUNTER — Inpatient Hospital Stay

## 2024-08-01 LAB — CEA: CEA: 8.1 ng/mL — ABNORMAL HIGH (ref 0.0–4.7)

## 2024-08-01 LAB — CANCER ANTIGEN 19-9: CA 19-9: 38 U/mL — ABNORMAL HIGH (ref 0–35)

## 2024-08-01 NOTE — Telephone Encounter (Signed)
 Pt scheduled to see Dr. Markham on 2/16

## 2024-08-02 ENCOUNTER — Inpatient Hospital Stay

## 2024-08-02 ENCOUNTER — Inpatient Hospital Stay: Attending: Oncology

## 2024-08-06 ENCOUNTER — Ambulatory Visit: Payer: Medicare Other | Admitting: Family Medicine

## 2024-10-16 ENCOUNTER — Ambulatory Visit (INDEPENDENT_AMBULATORY_CARE_PROVIDER_SITE_OTHER): Admitting: Vascular Surgery

## 2024-10-23 ENCOUNTER — Ambulatory Visit
